# Patient Record
Sex: Male | Born: 1935
Health system: Southern US, Community
[De-identification: ages and names within clinical notes are randomized; demographics above are authoritative.]

## PROBLEM LIST (undated history)

## (undated) DIAGNOSIS — I499 Cardiac arrhythmia, unspecified: Secondary | ICD-10-CM

## (undated) DIAGNOSIS — F419 Anxiety disorder, unspecified: Secondary | ICD-10-CM

## (undated) DIAGNOSIS — E611 Iron deficiency: Secondary | ICD-10-CM

## (undated) DIAGNOSIS — M47816 Spondylosis without myelopathy or radiculopathy, lumbar region: Secondary | ICD-10-CM

## (undated) DIAGNOSIS — G8929 Other chronic pain: Secondary | ICD-10-CM

## (undated) DIAGNOSIS — R7303 Prediabetes: Secondary | ICD-10-CM

## (undated) DIAGNOSIS — C61 Malignant neoplasm of prostate: Secondary | ICD-10-CM

## (undated) DIAGNOSIS — M545 Low back pain, unspecified: Secondary | ICD-10-CM

## (undated) DIAGNOSIS — K219 Gastro-esophageal reflux disease without esophagitis: Secondary | ICD-10-CM

## (undated) DIAGNOSIS — L4050 Arthropathic psoriasis, unspecified: Secondary | ICD-10-CM

## (undated) DIAGNOSIS — I10 Essential (primary) hypertension: Secondary | ICD-10-CM

## (undated) DIAGNOSIS — I471 Supraventricular tachycardia, unspecified: Secondary | ICD-10-CM

## (undated) DIAGNOSIS — G2581 Restless legs syndrome: Secondary | ICD-10-CM

## (undated) DIAGNOSIS — M1990 Unspecified osteoarthritis, unspecified site: Secondary | ICD-10-CM

## (undated) DIAGNOSIS — F329 Major depressive disorder, single episode, unspecified: Secondary | ICD-10-CM

## (undated) DIAGNOSIS — Z950 Presence of cardiac pacemaker: Secondary | ICD-10-CM

## (undated) DIAGNOSIS — I6523 Occlusion and stenosis of bilateral carotid arteries: Secondary | ICD-10-CM

## (undated) DIAGNOSIS — G4733 Obstructive sleep apnea (adult) (pediatric): Secondary | ICD-10-CM

## (undated) DIAGNOSIS — F32A Depression, unspecified: Secondary | ICD-10-CM

## (undated) DIAGNOSIS — Z9989 Dependence on other enabling machines and devices: Secondary | ICD-10-CM

## (undated) DIAGNOSIS — E785 Hyperlipidemia, unspecified: Secondary | ICD-10-CM

## (undated) DIAGNOSIS — I480 Paroxysmal atrial fibrillation: Secondary | ICD-10-CM

## (undated) DIAGNOSIS — Z8739 Personal history of other diseases of the musculoskeletal system and connective tissue: Secondary | ICD-10-CM

## (undated) DIAGNOSIS — I639 Cerebral infarction, unspecified: Secondary | ICD-10-CM

## (undated) HISTORY — DX: Hyperlipidemia, unspecified: E78.5

## (undated) HISTORY — PX: BREAST SURGERY: SHX581

## (undated) HISTORY — PX: BACK SURGERY: SHX140

## (undated) HISTORY — DX: Cerebral infarction, unspecified: I63.9

## (undated) HISTORY — DX: Malignant neoplasm of prostate: C61

## (undated) HISTORY — PX: JOINT REPLACEMENT: SHX530

## (undated) HISTORY — PX: CATARACT EXTRACTION W/ INTRAOCULAR LENS  IMPLANT, BILATERAL: SHX1307

## (undated) HISTORY — PX: TRANSURETHRAL RESECTION OF BLADDER TUMOR WITH GYRUS (TURBT-GYRUS): SHX6458

## (undated) HISTORY — DX: Essential (primary) hypertension: I10

---

## 1960-06-21 HISTORY — PX: INGUINAL HERNIA REPAIR: SUR1180

## 1995-06-22 HISTORY — PX: TOTAL KNEE ARTHROPLASTY: SHX125

## 1995-06-22 HISTORY — PX: INCISION AND DRAINAGE: SHX5863

## 1995-06-22 HISTORY — PX: REVISION TOTAL KNEE ARTHROPLASTY: SHX767

## 1998-01-05 ENCOUNTER — Ambulatory Visit: Admission: RE | Admit: 1998-01-05 | Discharge: 1998-01-05 | Payer: Self-pay | Admitting: Pulmonary Disease

## 1998-01-07 ENCOUNTER — Ambulatory Visit (HOSPITAL_BASED_OUTPATIENT_CLINIC_OR_DEPARTMENT_OTHER): Admission: RE | Admit: 1998-01-07 | Discharge: 1998-01-07 | Payer: Self-pay | Admitting: Surgery

## 2000-02-08 ENCOUNTER — Inpatient Hospital Stay (HOSPITAL_COMMUNITY): Admission: EM | Admit: 2000-02-08 | Discharge: 2000-02-10 | Payer: Self-pay | Admitting: Emergency Medicine

## 2000-02-08 ENCOUNTER — Encounter: Payer: Self-pay | Admitting: Emergency Medicine

## 2000-02-23 ENCOUNTER — Encounter: Admission: RE | Admit: 2000-02-23 | Discharge: 2000-02-23 | Payer: Self-pay | Admitting: Internal Medicine

## 2000-08-12 ENCOUNTER — Encounter: Payer: Self-pay | Admitting: Neurosurgery

## 2000-08-12 ENCOUNTER — Encounter: Admission: RE | Admit: 2000-08-12 | Discharge: 2000-08-12 | Payer: Self-pay | Admitting: Neurosurgery

## 2001-08-08 ENCOUNTER — Encounter: Admission: RE | Admit: 2001-08-08 | Discharge: 2001-11-06 | Payer: Self-pay | Admitting: Internal Medicine

## 2001-09-28 ENCOUNTER — Encounter: Payer: Self-pay | Admitting: Internal Medicine

## 2001-09-28 ENCOUNTER — Encounter: Payer: Self-pay | Admitting: Gastroenterology

## 2001-09-28 ENCOUNTER — Ambulatory Visit (HOSPITAL_COMMUNITY): Admission: RE | Admit: 2001-09-28 | Discharge: 2001-09-28 | Payer: Self-pay | Admitting: Gastroenterology

## 2001-09-28 ENCOUNTER — Emergency Department (HOSPITAL_COMMUNITY): Admission: EM | Admit: 2001-09-28 | Discharge: 2001-09-28 | Payer: Self-pay | Admitting: Emergency Medicine

## 2001-09-28 ENCOUNTER — Encounter (INDEPENDENT_AMBULATORY_CARE_PROVIDER_SITE_OTHER): Payer: Self-pay | Admitting: Specialist

## 2003-06-22 HISTORY — PX: INGUINAL HERNIA REPAIR: SUR1180

## 2003-06-22 HISTORY — PX: PENILE PROSTHESIS IMPLANT: SHX240

## 2003-06-26 ENCOUNTER — Encounter: Admission: RE | Admit: 2003-06-26 | Discharge: 2003-06-26 | Payer: Self-pay | Admitting: Internal Medicine

## 2003-06-28 ENCOUNTER — Encounter: Admission: RE | Admit: 2003-06-28 | Discharge: 2003-06-28 | Payer: Self-pay | Admitting: Internal Medicine

## 2003-07-12 ENCOUNTER — Encounter (INDEPENDENT_AMBULATORY_CARE_PROVIDER_SITE_OTHER): Payer: Self-pay | Admitting: Specialist

## 2003-07-12 ENCOUNTER — Observation Stay (HOSPITAL_COMMUNITY): Admission: RE | Admit: 2003-07-12 | Discharge: 2003-07-13 | Payer: Self-pay | Admitting: Surgery

## 2004-04-21 ENCOUNTER — Ambulatory Visit: Payer: Self-pay | Admitting: Internal Medicine

## 2004-04-27 ENCOUNTER — Ambulatory Visit: Payer: Self-pay | Admitting: Internal Medicine

## 2004-06-21 HISTORY — PX: TRANSURETHRAL RESECTION OF PROSTATE: SHX73

## 2004-07-27 ENCOUNTER — Ambulatory Visit: Payer: Self-pay | Admitting: Internal Medicine

## 2004-10-19 ENCOUNTER — Ambulatory Visit: Payer: Self-pay | Admitting: Internal Medicine

## 2004-11-06 ENCOUNTER — Ambulatory Visit: Payer: Self-pay | Admitting: Internal Medicine

## 2005-01-05 ENCOUNTER — Inpatient Hospital Stay (HOSPITAL_COMMUNITY): Admission: RE | Admit: 2005-01-05 | Discharge: 2005-01-07 | Payer: Self-pay | Admitting: Urology

## 2005-01-05 ENCOUNTER — Encounter (INDEPENDENT_AMBULATORY_CARE_PROVIDER_SITE_OTHER): Payer: Self-pay | Admitting: *Deleted

## 2005-01-11 ENCOUNTER — Ambulatory Visit: Payer: Self-pay | Admitting: Internal Medicine

## 2005-01-21 ENCOUNTER — Ambulatory Visit: Payer: Self-pay | Admitting: Internal Medicine

## 2005-04-09 ENCOUNTER — Ambulatory Visit: Payer: Self-pay | Admitting: Internal Medicine

## 2005-04-10 ENCOUNTER — Emergency Department (HOSPITAL_COMMUNITY): Admission: EM | Admit: 2005-04-10 | Discharge: 2005-04-10 | Payer: Self-pay | Admitting: Emergency Medicine

## 2005-04-10 ENCOUNTER — Ambulatory Visit: Payer: Self-pay | Admitting: Internal Medicine

## 2005-04-13 ENCOUNTER — Ambulatory Visit: Payer: Self-pay | Admitting: Internal Medicine

## 2005-06-21 HISTORY — PX: PROSTATE BIOPSY: SHX241

## 2005-06-29 ENCOUNTER — Ambulatory Visit: Payer: Self-pay | Admitting: Internal Medicine

## 2005-08-03 ENCOUNTER — Encounter (INDEPENDENT_AMBULATORY_CARE_PROVIDER_SITE_OTHER): Payer: Self-pay | Admitting: *Deleted

## 2005-08-03 ENCOUNTER — Ambulatory Visit (HOSPITAL_BASED_OUTPATIENT_CLINIC_OR_DEPARTMENT_OTHER): Admission: RE | Admit: 2005-08-03 | Discharge: 2005-08-03 | Payer: Self-pay | Admitting: Urology

## 2005-08-10 ENCOUNTER — Ambulatory Visit (HOSPITAL_COMMUNITY): Admission: RE | Admit: 2005-08-10 | Discharge: 2005-08-10 | Payer: Self-pay | Admitting: Urology

## 2005-08-13 ENCOUNTER — Ambulatory Visit: Admission: RE | Admit: 2005-08-13 | Discharge: 2005-09-03 | Payer: Self-pay | Admitting: Radiation Oncology

## 2005-09-20 ENCOUNTER — Ambulatory Visit: Payer: Self-pay | Admitting: Internal Medicine

## 2005-09-28 ENCOUNTER — Ambulatory Visit: Payer: Self-pay | Admitting: Internal Medicine

## 2005-10-08 ENCOUNTER — Ambulatory Visit: Payer: Self-pay

## 2005-10-18 ENCOUNTER — Ambulatory Visit: Payer: Self-pay | Admitting: Internal Medicine

## 2005-10-29 ENCOUNTER — Ambulatory Visit: Admission: RE | Admit: 2005-10-29 | Discharge: 2006-01-27 | Payer: Self-pay | Admitting: Radiation Oncology

## 2006-01-19 LAB — URINALYSIS, MICROSCOPIC - CHCC
Glucose: NEGATIVE g/dL
Nitrite: NEGATIVE
Protein: NEGATIVE mg/dL

## 2006-01-20 LAB — URINE CULTURE

## 2006-01-26 ENCOUNTER — Ambulatory Visit: Payer: Self-pay | Admitting: Internal Medicine

## 2006-04-05 ENCOUNTER — Ambulatory Visit: Payer: Self-pay | Admitting: Internal Medicine

## 2006-05-26 ENCOUNTER — Ambulatory Visit: Payer: Self-pay | Admitting: Internal Medicine

## 2006-06-21 DIAGNOSIS — C61 Malignant neoplasm of prostate: Secondary | ICD-10-CM

## 2006-06-21 HISTORY — DX: Malignant neoplasm of prostate: C61

## 2006-06-21 HISTORY — PX: CARDIAC ELECTROPHYSIOLOGY STUDY AND ABLATION: SHX1294

## 2006-07-06 ENCOUNTER — Ambulatory Visit: Payer: Self-pay | Admitting: Internal Medicine

## 2006-07-06 LAB — CONVERTED CEMR LAB
Basophils Absolute: 0 10*3/uL (ref 0.0–0.1)
IgE (Immunoglobulin E), Serum: <1.5 intl units/mL (ref 0.0–180.0)
Lymphocytes Relative: 13.4 % (ref 12.0–46.0)
MCHC: 33.9 g/dL (ref 30.0–36.0)
MCV: 103.7 fL — ABNORMAL HIGH (ref 78.0–100.0)
Monocytes Relative: 12.1 % — ABNORMAL HIGH (ref 3.0–11.0)
Neutro Abs: 5.6 10*3/uL (ref 1.4–7.7)
Platelets: 326 10*3/uL (ref 150–400)

## 2006-08-18 ENCOUNTER — Ambulatory Visit: Payer: Self-pay | Admitting: Internal Medicine

## 2006-10-03 ENCOUNTER — Ambulatory Visit: Payer: Self-pay | Admitting: Internal Medicine

## 2006-10-03 LAB — CONVERTED CEMR LAB
Alkaline Phosphatase: 82 units/L (ref 39–117)
BUN: 18 mg/dL (ref 6–23)
Basophils Relative: 0.5 % (ref 0.0–1.0)
Bilirubin, Direct: 0.1 mg/dL (ref 0.0–0.3)
CO2: 32 meq/L (ref 19–32)
Cholesterol: 150 mg/dL (ref 0–200)
GFR calc Af Amer: 85 mL/min
Glucose, Bld: 108 mg/dL — ABNORMAL HIGH (ref 70–99)
HDL: 46.1 mg/dL (ref >39.0)
Hemoglobin: 13.2 g/dL (ref 13.0–17.0)
Lymphocytes Relative: 23.2 % (ref 12.0–46.0)
Monocytes Absolute: 0.5 10*3/uL (ref 0.2–0.7)
Monocytes Relative: 11.5 % — ABNORMAL HIGH (ref 3.0–11.0)
Neutro Abs: 2.7 10*3/uL (ref 1.4–7.7)
PSA: <0.03 ng/mL — ABNORMAL LOW (ref 0.10–4.00)
Potassium: 4.1 meq/L (ref 3.5–5.1)
TSH: 1.81 microintl units/mL (ref 0.35–5.50)
Total Protein: 6 g/dL (ref 6.0–8.3)
VLDL: 28 mg/dL (ref 0–40)

## 2006-10-10 ENCOUNTER — Ambulatory Visit: Payer: Self-pay | Admitting: Internal Medicine

## 2006-11-04 ENCOUNTER — Inpatient Hospital Stay (HOSPITAL_COMMUNITY): Admission: EM | Admit: 2006-11-04 | Discharge: 2006-11-05 | Payer: Self-pay | Admitting: Emergency Medicine

## 2006-11-08 ENCOUNTER — Ambulatory Visit: Payer: Self-pay | Admitting: Internal Medicine

## 2006-11-09 ENCOUNTER — Ambulatory Visit: Payer: Self-pay

## 2006-11-17 ENCOUNTER — Emergency Department (HOSPITAL_COMMUNITY): Admission: EM | Admit: 2006-11-17 | Discharge: 2006-11-17 | Payer: Self-pay | Admitting: Emergency Medicine

## 2006-11-18 ENCOUNTER — Ambulatory Visit: Payer: Self-pay | Admitting: Cardiology

## 2006-11-21 ENCOUNTER — Encounter: Payer: Self-pay | Admitting: Internal Medicine

## 2006-11-21 ENCOUNTER — Ambulatory Visit: Payer: Self-pay

## 2006-11-23 ENCOUNTER — Encounter: Payer: Self-pay | Admitting: Internal Medicine

## 2006-11-23 ENCOUNTER — Ambulatory Visit: Payer: Self-pay

## 2006-12-01 ENCOUNTER — Ambulatory Visit: Payer: Self-pay | Admitting: Internal Medicine

## 2006-12-01 ENCOUNTER — Encounter: Payer: Self-pay | Admitting: Internal Medicine

## 2006-12-02 ENCOUNTER — Ambulatory Visit (HOSPITAL_COMMUNITY): Admission: RE | Admit: 2006-12-02 | Discharge: 2006-12-03 | Payer: Self-pay | Admitting: Internal Medicine

## 2006-12-02 ENCOUNTER — Ambulatory Visit: Payer: Self-pay | Admitting: Internal Medicine

## 2006-12-07 ENCOUNTER — Ambulatory Visit: Payer: Self-pay | Admitting: Cardiology

## 2006-12-07 ENCOUNTER — Observation Stay (HOSPITAL_COMMUNITY): Admission: EM | Admit: 2006-12-07 | Discharge: 2006-12-08 | Payer: Self-pay | Admitting: Cardiology

## 2006-12-08 ENCOUNTER — Ambulatory Visit: Payer: Self-pay | Admitting: Internal Medicine

## 2006-12-26 ENCOUNTER — Ambulatory Visit: Payer: Self-pay | Admitting: Internal Medicine

## 2007-01-02 ENCOUNTER — Encounter: Payer: Self-pay | Admitting: Internal Medicine

## 2007-01-19 DIAGNOSIS — Z8546 Personal history of malignant neoplasm of prostate: Secondary | ICD-10-CM

## 2007-01-19 DIAGNOSIS — E785 Hyperlipidemia, unspecified: Secondary | ICD-10-CM

## 2007-01-19 DIAGNOSIS — I10 Essential (primary) hypertension: Secondary | ICD-10-CM | POA: Insufficient documentation

## 2007-01-24 ENCOUNTER — Ambulatory Visit: Payer: Self-pay | Admitting: Internal Medicine

## 2007-01-24 LAB — CONVERTED CEMR LAB
ALT: 22 units/L (ref 0–53)
AST: 23 units/L (ref 0–37)
Bilirubin, Direct: 0.1 mg/dL (ref 0.0–0.3)
Cholesterol: 181 mg/dL (ref 0–200)
Total Bilirubin: 0.9 mg/dL (ref 0.3–1.2)
Total Protein: 5.9 g/dL — ABNORMAL LOW (ref 6.0–8.3)

## 2007-01-31 ENCOUNTER — Ambulatory Visit: Payer: Self-pay | Admitting: Internal Medicine

## 2007-01-31 DIAGNOSIS — L4050 Arthropathic psoriasis, unspecified: Secondary | ICD-10-CM

## 2007-01-31 DIAGNOSIS — R142 Eructation: Secondary | ICD-10-CM

## 2007-01-31 DIAGNOSIS — R141 Gas pain: Secondary | ICD-10-CM | POA: Insufficient documentation

## 2007-01-31 DIAGNOSIS — I482 Chronic atrial fibrillation, unspecified: Secondary | ICD-10-CM | POA: Insufficient documentation

## 2007-01-31 DIAGNOSIS — R143 Flatulence: Secondary | ICD-10-CM

## 2007-03-09 ENCOUNTER — Ambulatory Visit: Payer: Self-pay | Admitting: Cardiology

## 2007-03-23 ENCOUNTER — Ambulatory Visit: Payer: Self-pay | Admitting: Internal Medicine

## 2007-03-23 DIAGNOSIS — K219 Gastro-esophageal reflux disease without esophagitis: Secondary | ICD-10-CM | POA: Insufficient documentation

## 2007-03-23 LAB — CONVERTED CEMR LAB
Basophils Absolute: 0 10*3/uL (ref 0.0–0.1)
H Pylori IgG: NEGATIVE
MCHC: 34.2 g/dL (ref 30.0–36.0)
Monocytes Relative: 12.6 % — ABNORMAL HIGH (ref 3.0–11.0)
RBC: 4.3 M/uL (ref 4.22–5.81)
RDW: 12.7 % (ref 11.5–14.6)

## 2007-04-19 ENCOUNTER — Ambulatory Visit: Payer: Self-pay | Admitting: Vascular Surgery

## 2007-05-05 ENCOUNTER — Ambulatory Visit: Payer: Self-pay | Admitting: Internal Medicine

## 2007-06-22 HISTORY — PX: CHOLECYSTECTOMY: SHX55

## 2007-07-13 ENCOUNTER — Encounter: Payer: Self-pay | Admitting: Internal Medicine

## 2007-08-14 ENCOUNTER — Encounter: Payer: Self-pay | Admitting: Internal Medicine

## 2007-08-31 ENCOUNTER — Ambulatory Visit: Payer: Self-pay | Admitting: Internal Medicine

## 2007-08-31 DIAGNOSIS — G4700 Insomnia, unspecified: Secondary | ICD-10-CM | POA: Insufficient documentation

## 2007-08-31 DIAGNOSIS — G4730 Sleep apnea, unspecified: Secondary | ICD-10-CM

## 2007-08-31 LAB — CONVERTED CEMR LAB
HDL goal, serum: 40 mg/dL
LDL Goal: 100 mg/dL

## 2007-10-09 ENCOUNTER — Ambulatory Visit: Payer: Self-pay | Admitting: Internal Medicine

## 2007-10-09 LAB — CONVERTED CEMR LAB
ALT: 22 units/L (ref 0–53)
AST: 24 units/L (ref 0–37)
Albumin: 3.3 g/dL — ABNORMAL LOW (ref 3.5–5.2)
Alkaline Phosphatase: 76 units/L (ref 39–117)
BUN: 23 mg/dL (ref 6–23)
Bilirubin, Direct: 0.1 mg/dL (ref 0.0–0.3)
CO2: 30 meq/L (ref 19–32)
Chloride: 111 meq/L (ref 96–112)
Eosinophils Relative: 8.7 % — ABNORMAL HIGH (ref 0.0–5.0)
Glucose, Bld: 114 mg/dL — ABNORMAL HIGH (ref 70–99)
HCT: 38.4 % — ABNORMAL LOW (ref 39.0–52.0)
HDL: 46.1 mg/dL (ref >39.0)
LDL Cholesterol: 100 mg/dL — ABNORMAL HIGH (ref 0–99)
Lymphocytes Relative: 20.9 % (ref 12.0–46.0)
Monocytes Absolute: 0.7 10*3/uL (ref 0.1–1.0)
Monocytes Relative: 11.8 % (ref 3.0–12.0)
Neutrophils Relative %: 58.5 % (ref 43.0–77.0)
Nitrite: NEGATIVE
Platelets: 216 10*3/uL (ref 150–400)
Potassium: 4.4 meq/L (ref 3.5–5.1)
Sodium: 144 meq/L (ref 135–145)
Specific Gravity, Urine: 1.025
Total CHOL/HDL Ratio: 3.8
Total Protein: 5.8 g/dL — ABNORMAL LOW (ref 6.0–8.3)
Urobilinogen, UA: 0.2
WBC Urine, dipstick: NEGATIVE
WBC: 6.3 10*3/uL (ref 4.5–10.5)

## 2007-10-16 ENCOUNTER — Ambulatory Visit: Payer: Self-pay | Admitting: Internal Medicine

## 2007-12-25 ENCOUNTER — Ambulatory Visit: Payer: Self-pay | Admitting: Internal Medicine

## 2007-12-25 DIAGNOSIS — R609 Edema, unspecified: Secondary | ICD-10-CM

## 2007-12-25 DIAGNOSIS — I8310 Varicose veins of unspecified lower extremity with inflammation: Secondary | ICD-10-CM

## 2008-01-03 ENCOUNTER — Ambulatory Visit: Payer: Self-pay | Admitting: Vascular Surgery

## 2008-01-31 ENCOUNTER — Telehealth: Payer: Self-pay | Admitting: Family Medicine

## 2008-01-31 ENCOUNTER — Inpatient Hospital Stay (HOSPITAL_COMMUNITY): Admission: EM | Admit: 2008-01-31 | Discharge: 2008-02-02 | Payer: Self-pay | Admitting: Emergency Medicine

## 2008-02-01 ENCOUNTER — Encounter (INDEPENDENT_AMBULATORY_CARE_PROVIDER_SITE_OTHER): Payer: Self-pay | Admitting: General Surgery

## 2008-02-20 ENCOUNTER — Ambulatory Visit: Payer: Self-pay | Admitting: Internal Medicine

## 2008-02-20 DIAGNOSIS — K8019 Calculus of gallbladder with other cholecystitis with obstruction: Secondary | ICD-10-CM

## 2008-03-21 ENCOUNTER — Ambulatory Visit: Payer: Self-pay | Admitting: Internal Medicine

## 2008-03-27 ENCOUNTER — Telehealth: Payer: Self-pay | Admitting: Internal Medicine

## 2008-04-22 ENCOUNTER — Telehealth: Payer: Self-pay | Admitting: Internal Medicine

## 2008-04-24 ENCOUNTER — Ambulatory Visit: Payer: Self-pay | Admitting: *Deleted

## 2008-04-24 ENCOUNTER — Ambulatory Visit: Payer: Self-pay | Admitting: Cardiovascular Disease

## 2008-04-24 LAB — CONVERTED CEMR LAB
BUN: 24 mg/dL — ABNORMAL HIGH (ref 6–23)
CO2: 27 meq/L (ref 19–32)
Calcium: 9 mg/dL (ref 8.4–10.5)
GFR calc Af Amer: 70 mL/min
Glucose, Bld: 101 mg/dL — ABNORMAL HIGH (ref 70–99)

## 2008-04-29 ENCOUNTER — Telehealth: Payer: Self-pay | Admitting: Internal Medicine

## 2008-05-01 ENCOUNTER — Ambulatory Visit: Payer: Self-pay | Admitting: Internal Medicine

## 2008-05-01 DIAGNOSIS — J45901 Unspecified asthma with (acute) exacerbation: Secondary | ICD-10-CM

## 2008-05-02 ENCOUNTER — Ambulatory Visit: Payer: Self-pay | Admitting: Internal Medicine

## 2008-06-24 ENCOUNTER — Ambulatory Visit: Payer: Self-pay | Admitting: Internal Medicine

## 2008-06-24 DIAGNOSIS — B368 Other specified superficial mycoses: Secondary | ICD-10-CM

## 2008-10-14 ENCOUNTER — Ambulatory Visit: Payer: Self-pay | Admitting: Internal Medicine

## 2008-10-14 ENCOUNTER — Telehealth: Payer: Self-pay | Admitting: Internal Medicine

## 2008-10-15 ENCOUNTER — Ambulatory Visit: Payer: Self-pay | Admitting: Internal Medicine

## 2008-10-15 DIAGNOSIS — Q762 Congenital spondylolisthesis: Secondary | ICD-10-CM

## 2008-10-17 ENCOUNTER — Ambulatory Visit: Payer: Self-pay | Admitting: Internal Medicine

## 2008-10-18 ENCOUNTER — Encounter: Payer: Self-pay | Admitting: Internal Medicine

## 2008-10-28 ENCOUNTER — Telehealth: Payer: Self-pay | Admitting: Internal Medicine

## 2009-01-03 ENCOUNTER — Ambulatory Visit: Payer: Self-pay | Admitting: Vascular Surgery

## 2009-01-07 ENCOUNTER — Telehealth (INDEPENDENT_AMBULATORY_CARE_PROVIDER_SITE_OTHER): Payer: Self-pay | Admitting: *Deleted

## 2009-01-10 ENCOUNTER — Ambulatory Visit: Payer: Self-pay | Admitting: Internal Medicine

## 2009-01-10 DIAGNOSIS — R7309 Other abnormal glucose: Secondary | ICD-10-CM

## 2009-01-10 LAB — CONVERTED CEMR LAB
BUN: 21 mg/dL (ref 6–23)
Basophils Relative: 0.1 % (ref 0.0–3.0)
CO2: 31 meq/L (ref 19–32)
Calcium: 8.9 mg/dL (ref 8.4–10.5)
Creatinine, Ser: 1.1 mg/dL (ref 0.4–1.5)
Eosinophils Absolute: 0.1 10*3/uL (ref 0.0–0.7)
GFR calc non Af Amer: 69.65 mL/min (ref >60)
Glucose, Bld: 101 mg/dL — ABNORMAL HIGH (ref 70–99)
MCHC: 33.6 g/dL (ref 30.0–36.0)
MCV: 92.4 fL (ref 78.0–100.0)
Monocytes Absolute: 0.9 10*3/uL (ref 0.1–1.0)
Neutrophils Relative %: 74.7 % (ref 43.0–77.0)
Platelets: 213 10*3/uL (ref 150.0–400.0)
RBC: 4.35 M/uL (ref 4.22–5.81)
RDW: 13.2 % (ref 11.5–14.6)

## 2009-03-28 ENCOUNTER — Encounter (INDEPENDENT_AMBULATORY_CARE_PROVIDER_SITE_OTHER): Payer: Self-pay | Admitting: *Deleted

## 2009-04-12 ENCOUNTER — Encounter: Admission: RE | Admit: 2009-04-12 | Discharge: 2009-04-12 | Payer: Self-pay | Admitting: Neurosurgery

## 2009-04-16 ENCOUNTER — Encounter (INDEPENDENT_AMBULATORY_CARE_PROVIDER_SITE_OTHER): Payer: Self-pay | Admitting: *Deleted

## 2009-04-21 ENCOUNTER — Encounter (INDEPENDENT_AMBULATORY_CARE_PROVIDER_SITE_OTHER): Payer: Self-pay | Admitting: *Deleted

## 2009-05-07 ENCOUNTER — Ambulatory Visit: Payer: Self-pay | Admitting: Internal Medicine

## 2009-05-07 LAB — CONVERTED CEMR LAB
Bilirubin, Direct: 0 mg/dL (ref 0.0–0.3)
Cholesterol: 198 mg/dL (ref 0–200)
HDL: 47.9 mg/dL (ref >39.00)
LDL Cholesterol: 123 mg/dL — ABNORMAL HIGH (ref 0–99)
Total Bilirubin: 1 mg/dL (ref 0.3–1.2)
Total Protein: 6.2 g/dL (ref 6.0–8.3)
VLDL: 26.8 mg/dL (ref 0.0–40.0)
Vit D, 25-Hydroxy: 25 ng/mL — ABNORMAL LOW (ref 30–89)

## 2009-05-12 ENCOUNTER — Ambulatory Visit: Payer: Self-pay | Admitting: Internal Medicine

## 2009-05-12 DIAGNOSIS — M818 Other osteoporosis without current pathological fracture: Secondary | ICD-10-CM

## 2009-05-19 ENCOUNTER — Inpatient Hospital Stay (HOSPITAL_COMMUNITY): Admission: RE | Admit: 2009-05-19 | Discharge: 2009-05-21 | Payer: Self-pay | Admitting: Neurosurgery

## 2009-05-26 ENCOUNTER — Telehealth: Payer: Self-pay | Admitting: Internal Medicine

## 2009-06-02 ENCOUNTER — Encounter: Payer: Self-pay | Admitting: Internal Medicine

## 2009-07-02 ENCOUNTER — Ambulatory Visit: Payer: Self-pay | Admitting: Internal Medicine

## 2009-07-02 ENCOUNTER — Ambulatory Visit: Payer: Self-pay

## 2009-07-02 DIAGNOSIS — L03119 Cellulitis of unspecified part of limb: Secondary | ICD-10-CM

## 2009-07-02 DIAGNOSIS — I808 Phlebitis and thrombophlebitis of other sites: Secondary | ICD-10-CM | POA: Insufficient documentation

## 2009-07-02 DIAGNOSIS — L02419 Cutaneous abscess of limb, unspecified: Secondary | ICD-10-CM | POA: Insufficient documentation

## 2009-07-04 ENCOUNTER — Telehealth: Payer: Self-pay | Admitting: Internal Medicine

## 2009-07-18 ENCOUNTER — Ambulatory Visit: Payer: Self-pay | Admitting: Vascular Surgery

## 2009-08-07 ENCOUNTER — Ambulatory Visit: Payer: Self-pay | Admitting: Internal Medicine

## 2009-08-07 LAB — CONVERTED CEMR LAB: Vit D, 25-Hydroxy: 48 ng/mL (ref 30–89)

## 2009-08-20 ENCOUNTER — Ambulatory Visit: Payer: Self-pay | Admitting: Internal Medicine

## 2009-10-03 ENCOUNTER — Encounter: Payer: Self-pay | Admitting: Internal Medicine

## 2009-11-19 ENCOUNTER — Ambulatory Visit: Payer: Self-pay | Admitting: Family Medicine

## 2009-11-19 DIAGNOSIS — R319 Hematuria, unspecified: Secondary | ICD-10-CM

## 2009-11-20 LAB — CONVERTED CEMR LAB
ALT: 36 units/L (ref 0–53)
Albumin: 4.1 g/dL (ref 3.5–5.2)
Alkaline Phosphatase: 82 units/L (ref 39–117)
BUN: 29 mg/dL — ABNORMAL HIGH (ref 6–23)
Basophils Relative: 0.4 % (ref 0.0–3.0)
Bilirubin, Direct: 0.1 mg/dL (ref 0.0–0.3)
CO2: 30 meq/L (ref 19–32)
Calcium: 9.8 mg/dL (ref 8.4–10.5)
Eosinophils Relative: 0.1 % (ref 0.0–5.0)
GFR calc non Af Amer: 48.73 mL/min (ref >60)
Glucose, Bld: 132 mg/dL — ABNORMAL HIGH (ref 70–99)
HCT: 42.6 % (ref 39.0–52.0)
HDL: 66.5 mg/dL (ref >39.00)
Hemoglobin: 14.7 g/dL (ref 13.0–17.0)
Lymphs Abs: 0.7 10*3/uL (ref 0.7–4.0)
MCV: 89.2 fL (ref 78.0–100.0)
Monocytes Absolute: 0.5 10*3/uL (ref 0.1–1.0)
Monocytes Relative: 4.4 % (ref 3.0–12.0)
Neutro Abs: 11 10*3/uL — ABNORMAL HIGH (ref 1.4–7.7)
Sodium: 145 meq/L (ref 135–145)
Total CHOL/HDL Ratio: 3
Total Protein: 7.3 g/dL (ref 6.0–8.3)
Triglycerides: 78 mg/dL (ref 0.0–149.0)
WBC: 12.2 10*3/uL — ABNORMAL HIGH (ref 4.5–10.5)

## 2009-11-21 LAB — CONVERTED CEMR LAB: PSA: 0.44 ng/mL (ref 0.10–4.00)

## 2009-11-27 ENCOUNTER — Encounter: Payer: Self-pay | Admitting: Internal Medicine

## 2009-12-04 ENCOUNTER — Encounter: Payer: Self-pay | Admitting: Internal Medicine

## 2010-01-02 ENCOUNTER — Telehealth: Payer: Self-pay | Admitting: Adult Health

## 2010-01-02 ENCOUNTER — Telehealth: Payer: Self-pay | Admitting: Internal Medicine

## 2010-01-05 ENCOUNTER — Ambulatory Visit: Payer: Self-pay | Admitting: Internal Medicine

## 2010-01-06 ENCOUNTER — Telehealth: Payer: Self-pay | Admitting: Internal Medicine

## 2010-01-07 ENCOUNTER — Ambulatory Visit: Payer: Self-pay | Admitting: Internal Medicine

## 2010-01-07 LAB — CONVERTED CEMR LAB: Glucose, 2 hour: 77 mg/dL

## 2010-01-08 ENCOUNTER — Telehealth: Payer: Self-pay | Admitting: Internal Medicine

## 2010-01-09 ENCOUNTER — Telehealth: Payer: Self-pay | Admitting: Internal Medicine

## 2010-01-13 ENCOUNTER — Ambulatory Visit: Payer: Self-pay | Admitting: Family Medicine

## 2010-01-13 DIAGNOSIS — R51 Headache: Secondary | ICD-10-CM

## 2010-01-13 DIAGNOSIS — R519 Headache, unspecified: Secondary | ICD-10-CM | POA: Insufficient documentation

## 2010-01-15 ENCOUNTER — Encounter: Admission: RE | Admit: 2010-01-15 | Discharge: 2010-01-15 | Payer: Self-pay | Admitting: Family Medicine

## 2010-01-16 ENCOUNTER — Encounter: Payer: Self-pay | Admitting: Internal Medicine

## 2010-01-26 ENCOUNTER — Ambulatory Visit: Payer: Self-pay | Admitting: Internal Medicine

## 2010-01-30 ENCOUNTER — Ambulatory Visit (HOSPITAL_COMMUNITY): Admission: RE | Admit: 2010-01-30 | Discharge: 2010-01-30 | Payer: Self-pay | Admitting: Internal Medicine

## 2010-01-30 ENCOUNTER — Ambulatory Visit: Payer: Self-pay | Admitting: Internal Medicine

## 2010-01-30 HISTORY — PX: OTHER SURGICAL HISTORY: SHX169

## 2010-02-05 LAB — CONVERTED CEMR LAB
BUN: 22 mg/dL (ref 6–23)
Basophils Absolute: 0 10*3/uL (ref 0.0–0.1)
Basophils Relative: 0.5 % (ref 0.0–3.0)
Calcium: 9 mg/dL (ref 8.4–10.5)
Chloride: 105 meq/L (ref 96–112)
Creatinine, Ser: 1.2 mg/dL (ref 0.4–1.5)
Eosinophils Absolute: 0.2 10*3/uL (ref 0.0–0.7)
GFR calc non Af Amer: 65.98 mL/min (ref >60)
Hemoglobin: 12.4 g/dL — ABNORMAL LOW (ref 13.0–17.0)
INR: 0.9 (ref 0.8–1.0)
Lymphocytes Relative: 12.5 % (ref 12.0–46.0)
Lymphs Abs: 0.9 10*3/uL (ref 0.7–4.0)
MCHC: 34.4 g/dL (ref 30.0–36.0)
MCV: 87.3 fL (ref 78.0–100.0)
Monocytes Absolute: 0.9 10*3/uL (ref 0.1–1.0)
Neutro Abs: 5.3 10*3/uL (ref 1.4–7.7)
Prothrombin Time: 9.8 s (ref 9.7–11.8)
RBC: 4.12 M/uL — ABNORMAL LOW (ref 4.22–5.81)
RDW: 14.1 % (ref 11.5–14.6)

## 2010-02-06 ENCOUNTER — Telehealth: Payer: Self-pay | Admitting: Internal Medicine

## 2010-02-06 ENCOUNTER — Encounter: Payer: Self-pay | Admitting: Internal Medicine

## 2010-02-16 ENCOUNTER — Ambulatory Visit: Payer: Self-pay | Admitting: Internal Medicine

## 2010-03-02 ENCOUNTER — Encounter: Payer: Self-pay | Admitting: Internal Medicine

## 2010-03-03 ENCOUNTER — Encounter: Payer: Self-pay | Admitting: Internal Medicine

## 2010-03-03 ENCOUNTER — Ambulatory Visit: Payer: Self-pay | Admitting: Vascular Surgery

## 2010-03-10 ENCOUNTER — Encounter: Payer: Self-pay | Admitting: Internal Medicine

## 2010-03-17 ENCOUNTER — Telehealth: Payer: Self-pay | Admitting: Internal Medicine

## 2010-04-13 ENCOUNTER — Encounter: Payer: Self-pay | Admitting: Internal Medicine

## 2010-04-13 ENCOUNTER — Ambulatory Visit: Payer: Self-pay | Admitting: Internal Medicine

## 2010-04-13 LAB — CONVERTED CEMR LAB
ALT: 24 units/L (ref 0–53)
AST: 26 units/L (ref 0–37)
BUN: 25 mg/dL — ABNORMAL HIGH (ref 6–23)
Basophils Relative: 0.5 % (ref 0.0–3.0)
Bilirubin, Direct: 0.1 mg/dL (ref 0.0–0.3)
Chloride: 104 meq/L (ref 96–112)
Cholesterol: 139 mg/dL (ref 0–200)
Creatinine, Ser: 1.2 mg/dL (ref 0.4–1.5)
Eosinophils Relative: 2.7 % (ref 0.0–5.0)
GFR calc non Af Amer: 61.02 mL/min (ref >60)
HCT: 35.3 % — ABNORMAL LOW (ref 39.0–52.0)
LDL Cholesterol: 71 mg/dL (ref 0–99)
MCV: 88.3 fL (ref 78.0–100.0)
Monocytes Relative: 12.8 % — ABNORMAL HIGH (ref 3.0–12.0)
Neutrophils Relative %: 67 % (ref 43.0–77.0)
Nitrite: NEGATIVE
Platelets: 204 10*3/uL (ref 150.0–400.0)
Potassium: 4.1 meq/L (ref 3.5–5.1)
Protein, U semiquant: NEGATIVE
RBC: 4 M/uL — ABNORMAL LOW (ref 4.22–5.81)
Total Bilirubin: 0.6 mg/dL (ref 0.3–1.2)
Total CHOL/HDL Ratio: 3
Total Protein: 5.7 g/dL — ABNORMAL LOW (ref 6.0–8.3)
Urobilinogen, UA: 0.2
VLDL: 24 mg/dL (ref 0.0–40.0)
WBC: 6 10*3/uL (ref 4.5–10.5)

## 2010-04-15 ENCOUNTER — Ambulatory Visit: Payer: Self-pay | Admitting: Internal Medicine

## 2010-04-16 ENCOUNTER — Encounter: Payer: Self-pay | Admitting: Internal Medicine

## 2010-04-17 ENCOUNTER — Ambulatory Visit: Payer: Self-pay

## 2010-04-20 ENCOUNTER — Ambulatory Visit: Payer: Self-pay | Admitting: Internal Medicine

## 2010-04-20 ENCOUNTER — Telehealth: Payer: Self-pay | Admitting: Internal Medicine

## 2010-04-20 DIAGNOSIS — D508 Other iron deficiency anemias: Secondary | ICD-10-CM

## 2010-04-20 DIAGNOSIS — L408 Other psoriasis: Secondary | ICD-10-CM

## 2010-04-21 LAB — CONVERTED CEMR LAB: Saturation Ratios: 11.1 % — ABNORMAL LOW (ref 20.0–50.0)

## 2010-05-19 HISTORY — PX: OTHER SURGICAL HISTORY: SHX169

## 2010-06-01 ENCOUNTER — Encounter (INDEPENDENT_AMBULATORY_CARE_PROVIDER_SITE_OTHER): Payer: Self-pay | Admitting: *Deleted

## 2010-06-01 ENCOUNTER — Ambulatory Visit: Payer: Self-pay | Admitting: Internal Medicine

## 2010-06-01 DIAGNOSIS — R55 Syncope and collapse: Secondary | ICD-10-CM

## 2010-06-03 ENCOUNTER — Telehealth: Payer: Self-pay | Admitting: Internal Medicine

## 2010-06-04 ENCOUNTER — Ambulatory Visit (HOSPITAL_COMMUNITY)
Admission: RE | Admit: 2010-06-04 | Discharge: 2010-06-05 | Payer: Self-pay | Source: Home / Self Care | Attending: Internal Medicine | Admitting: Internal Medicine

## 2010-06-04 HISTORY — PX: CARDIAC PACEMAKER PLACEMENT: SHX583

## 2010-06-04 HISTORY — PX: LOOP RECORDER REMOVAL: EP1215

## 2010-06-04 LAB — CONVERTED CEMR LAB
Basophils Relative: 0.4 % (ref 0.0–3.0)
CO2: 28 meq/L (ref 19–32)
Calcium: 9.4 mg/dL (ref 8.4–10.5)
GFR calc non Af Amer: 61.57 mL/min (ref >60.00)
Hemoglobin: 13.8 g/dL (ref 13.0–17.0)
Lymphocytes Relative: 7.2 % — ABNORMAL LOW (ref 12.0–46.0)
MCHC: 34.1 g/dL (ref 30.0–36.0)
Monocytes Relative: 5.8 % (ref 3.0–12.0)
Neutro Abs: 7.7 10*3/uL (ref 1.4–7.7)
Potassium: 5 meq/L (ref 3.5–5.1)
RBC: 4.55 M/uL (ref 4.22–5.81)
Sodium: 141 meq/L (ref 135–145)
aPTT: 23.5 s (ref 21.7–28.8)

## 2010-06-05 ENCOUNTER — Encounter: Payer: Self-pay | Admitting: Internal Medicine

## 2010-06-10 ENCOUNTER — Telehealth (INDEPENDENT_AMBULATORY_CARE_PROVIDER_SITE_OTHER): Payer: Self-pay | Admitting: *Deleted

## 2010-06-12 ENCOUNTER — Ambulatory Visit: Payer: Self-pay | Admitting: Internal Medicine

## 2010-06-17 ENCOUNTER — Ambulatory Visit: Payer: Self-pay

## 2010-07-20 ENCOUNTER — Ambulatory Visit
Admission: RE | Admit: 2010-07-20 | Discharge: 2010-07-20 | Payer: Self-pay | Source: Home / Self Care | Attending: Internal Medicine | Admitting: Internal Medicine

## 2010-07-20 ENCOUNTER — Other Ambulatory Visit: Payer: Self-pay | Admitting: Internal Medicine

## 2010-07-20 DIAGNOSIS — H539 Unspecified visual disturbance: Secondary | ICD-10-CM | POA: Insufficient documentation

## 2010-07-20 LAB — BASIC METABOLIC PANEL
BUN: 16 mg/dL (ref 6–23)
GFR: 69.36 mL/min (ref >60.00)
Potassium: 5.1 mEq/L (ref 3.5–5.1)
Sodium: 141 mEq/L (ref 135–145)

## 2010-07-20 LAB — CBC WITH DIFFERENTIAL/PLATELET
Eosinophils Relative: 0.3 % (ref 0.0–5.0)
Lymphocytes Relative: 7.6 % — ABNORMAL LOW (ref 12.0–46.0)
Monocytes Absolute: 0.7 10*3/uL (ref 0.1–1.0)
Monocytes Relative: 7.5 % (ref 3.0–12.0)
Neutrophils Relative %: 84.3 % — ABNORMAL HIGH (ref 43.0–77.0)
Platelets: 233 10*3/uL (ref 150.0–400.0)
RBC: 4.55 Mil/uL (ref 4.22–5.81)
WBC: 9 10*3/uL (ref 4.5–10.5)

## 2010-07-20 LAB — CHOLESTEROL, TOTAL: Cholesterol: 141 mg/dL (ref 0–200)

## 2010-07-20 LAB — IBC PANEL
Iron: 37 ug/dL — ABNORMAL LOW (ref 42–165)
Saturation Ratios: 8 % — ABNORMAL LOW (ref 20.0–50.0)
Transferrin: 330 mg/dL (ref 212.0–360.0)

## 2010-07-20 LAB — LDL CHOLESTEROL, DIRECT: Direct LDL: 75.1 mg/dL

## 2010-07-20 LAB — HDL CHOLESTEROL: HDL: 52.3 mg/dL (ref >39.00)

## 2010-07-23 NOTE — Assessment & Plan Note (Signed)
Summary: cellulitis/per Christopher Baker/pt coming in at 12:00pm/cjr   Vital Signs:  Patient profile:   77 year old male Height:      62 inches Weight:      226 pounds BMI:     41.49 Temp:     98.2 degrees F oral Pulse rate:   80 / minute Resp:     14 per minute BP sitting:   150 / 70  (left arm)  Vitals Entered By: Willy Eddy, LPN (July 02, 2009 12:10 PM) CC: c/o of left foot red and swollen and painful and hot to touch   CC:  c/o of left foot red and swollen and painful and hot to touch.  History of Present Illness: THE PT HAD SWEELING FOR 24 HJOURS IN THE LEFT LEG WITH PAIN AND SWELLING the swelling did not resolve overnight and the redness persisted therfore he came to the office for evaluaton. He has had recent surgery with emobilization as a risk He wears comptression stocking for venous dz and edema he has no hx of blood clots or clotting disorder  Preventive Screening-Counseling & Management  Alcohol-Tobacco     Smoking Status: quit > 6 months  Problems Prior to Update: 1)  Cellulitis, Leg, Left  (ICD-682.6) 2)  Phlebitis and Thrombophlebitis of Other Site  (ICD-451.89) 3)  Disuse Osteoporosis  (ICD-733.03) 4)  Hyperglycemia, Borderline  (ICD-790.29) 5)  Leukocytosis Unspecified  (ICD-288.60) 6)  Spondylosis, Lumbar, With Radiculopathy  (ICD-756.11) 7)  Hip Pain, Left  (ICD-719.45) 8)  Other Specified Dermatomycoses  (ICD-111.8) 9)  Extrinsic Asthma, With Exacerbation  (ICD-493.02) 10)  Calcu Gallbladd W/oth Cholecystitis&obstruction  (ICD-574.11) 11)  Varicose Veins Lower Extremities W/inflammation  (ICD-454.1) 12)  Edema  (ICD-782.3) 13)  Preventive Health Care  (ICD-V70.0) 14)  Arthritis, Hand  (ICD-716.94) 15)  Insomnia With Sleep Apnea Unspecified  (ICD-780.51) 16)  Gerd  (ICD-530.81) 17)  Psoriatic Arthritis  (ICD-696.0) 18)  Fibrillation, Atrial  (ICD-427.31) 19)  Abdominal Bloating  (ICD-787.3) 20)  Advef, Drug/medicinal/biological Subst Nos   (ICD-995.20) 21)  Hyperlipidemia  (ICD-272.4) 22)  Prostate Cancer, Hx of  (ICD-V10.46) 23)  Hypertension  (ICD-401.9) 24)  Hyperlipidemia  (ICD-272.4)  Medications Prior to Update: 1)  Benicar Hct 40-12.5 Mg  Tabs (Olmesartan Medoxomil-Hctz) .... Take 1 Tablet By Mouth Once A Day 2)  Vytorin 10-40 Mg  Tabs (Ezetimibe-Simvastatin) .... Once Daily 3)  Prednisone 5 Mg  Tabs (Prednisone) .... Take 1 Tablet By Mouth Once A Day 4)  Requip 2 Mg Tabs (Ropinirole Hcl) .Marland Kitchen.. 1 Once Daily 5)  Zyrtec Allergy 10 Mg  Tabs (Cetirizine Hcl) .... As Needed 6)  Nabumetone 500 Mg  Tabs (Nabumetone) .... Once Daily 7)  Zegerid 40-1100 Mg  Caps (Omeprazole-Sodium Bicarbonate) .... One By Mouth Q Hs 8)  Amlodipine Besylate 5 Mg  Tabs (Amlodipine Besylate) .... One By Mouth Daqily 9)  Hydrocodone-Acetaminophen 5-325 Mg  Tabs (Hydrocodone-Acetaminophen) .... One By Mouth Q Hs 10)  Voltaren 1 %  Gel (Diclofenac Sodium) .... To Knees Prn 11)  Lotrisone 1-0.05 % Crea (Clotrimazole-Betamethasone) .... Apply To Affected Area Twice A Day For 7 To 14 Days 12)  Prednisone 10 Mg Tabs (Prednisone) .... 4 By Mouth For 4 Days The 3 By Mouth For 4 Days The 2 By Mouth For 4 Days The 1 By Mouth For 4 Days Then Resume 5 Mg A Day 13)  Skelaxin 800 Mg Tabs (Metaxalone) .... One Half To One By Mouth Every 6 Hours ( One Whole At  Night) 14)  Percocet 10-650 Mg Tabs (Oxycodone-Acetaminophen) .... One By Mouth Q 6 15)  Temazepam 30 Mg Caps (Temazepam) .Marland Kitchen.. 1 At Bedtime As Needed Sleep 16)  Vitamin D (Ergocalciferol) 50000 Unit Caps (Ergocalciferol) .... One By Mouth Weekly 17)  Cipro 500 Mg Tabs (Ciprofloxacin Hcl) .... One Two Times A Day For 10 Days.  Current Medications (verified): 1)  Benicar Hct 40-12.5 Mg  Tabs (Olmesartan Medoxomil-Hctz) .... Take 1 Tablet By Mouth Once A Day 2)  Vytorin 10-40 Mg  Tabs (Ezetimibe-Simvastatin) .... Once Daily 3)  Prednisone 5 Mg  Tabs (Prednisone) .... Take 1 Tablet By Mouth Once A Day 4)   Requip 2 Mg Tabs (Ropinirole Hcl) .Marland Kitchen.. 1 Once Daily 5)  Zyrtec Allergy 10 Mg  Tabs (Cetirizine Hcl) .... As Needed 6)  Nabumetone 500 Mg  Tabs (Nabumetone) .... Once Daily 7)  Zegerid 40-1100 Mg  Caps (Omeprazole-Sodium Bicarbonate) .... One By Mouth Q Hs 8)  Amlodipine Besylate 5 Mg  Tabs (Amlodipine Besylate) .... One By Mouth Daqily 9)  Hydrocodone-Acetaminophen 5-325 Mg  Tabs (Hydrocodone-Acetaminophen) .... One By Mouth Q Hs 10)  Voltaren 1 %  Gel (Diclofenac Sodium) .... To Knees Prn 11)  Lotrisone 1-0.05 % Crea (Clotrimazole-Betamethasone) .... Apply To Affected Area Twice A Day For 7 To 14 Days 12)  Prednisone 5 Mg Tabs (Prednisone) .Marland Kitchen.. 1 Once Daily 13)  Skelaxin 800 Mg Tabs (Metaxalone) .... One Half To One By Mouth Every 6 Hours ( One Whole At Night) 14)  Percocet 10-650 Mg Tabs (Oxycodone-Acetaminophen) .... One By Mouth Q 6 15)  Temazepam 30 Mg Caps (Temazepam) .Marland Kitchen.. 1 At Bedtime As Needed Sleep 16)  Vitamin D (Ergocalciferol) 50000 Unit Caps (Ergocalciferol) .... One By Mouth Weekly 17)  Cephalexin 500 Mg Caps (Cephalexin) .... One By Mouth Three Times A Day For 7 Days  Allergies (verified): 1)  ! Cipro  Past History:  Family History: Last updated: 10/16/2007 Family History High cholesterol  Social History: Last updated: 01/31/2007 Retired Married Former Smoker  Risk Factors: Smoking Status: quit > 6 months (07/02/2009)  Past medical, surgical, family and social histories (including risk factors) reviewed, and no changes noted (except as noted below).  Past Medical History: Reviewed history from 01/19/2007 and no changes required. Hyperlipidemia Hypertension Prostate cancer, hx of  Past Surgical History: Reviewed history from 01/31/2007 and no changes required. Knee replacement radiation to prostate  Family History: Reviewed history from 10/16/2007 and no changes required. Family History High cholesterol  Social History: Reviewed history from 01/31/2007  and no changes required. Retired Married Former Smoker  Review of Systems  The patient denies anorexia, fever, weight loss, weight gain, vision loss, decreased hearing, hoarseness, chest pain, syncope, dyspnea on exertion, peripheral edema, prolonged cough, headaches, hemoptysis, abdominal pain, melena, hematochezia, severe indigestion/heartburn, hematuria, incontinence, genital sores, muscle weakness, suspicious skin lesions, transient blindness, difficulty walking, depression, unusual weight change, abnormal bleeding, enlarged lymph nodes, angioedema, breast masses, and testicular masses.    Physical Exam  General:  anxious appearing.   Head:  normocephalic and atraumatic.   Eyes:  pupils equal and pupils reactive to light.   Ears:  R ear normal, L ear normal, and no external deformities.   Nose:  no external erythema.   Mouth:  Oral mucosa and oropharynx without lesions or exudates.  Teeth in good repair. Neck:  No deformities, masses, or tenderness noted. Lungs:  normal respiratory effort.   increased bronchjial breath sounds, no wheezing Heart:  Normal rate and regular  rhythm. S1 and S2 normal without gallop. Abdomen:  soft, non-tender, and normal bowel sounds.   Msk:  joint tenderness, joint swelling, and joint warmth.  cording behind the the left ankle with pain and erythema Pulses:  R and L carotid,radial,femoral,dorsalis pedis and posterior tibial pulses are full and equal bilaterally Extremities:  No clubbing, cyanosis, edema, or deformity noted with normal full range of motion of all joints.     Impression & Recommendations:  Problem # 1:  CELLULITIS, LEG, LEFT (ICD-682.6)  The following medications were removed from the medication list:    Cipro 500 Mg Tabs (Ciprofloxacin hcl) ..... One two times a day for 10 days. His updated medication list for this problem includes:    Cephalexin 500 Mg Caps (Cephalexin) ..... One by mouth three times a day for 7 days  Elevate  affected area. Warm moist compresses for 20 minutes every 2 hours while awake. Take antibiotics as directed and take acetaminophen as needed. To be seen in 48-72 hours if no improvement, sooner if worse.  Problem # 2:  PHLEBITIS AND THROMBOPHLEBITIS OF OTHER SITE (ICD-451.89)  Orders: Doppler Referral (Doppler)  Complete Medication List: 1)  Benicar Hct 40-12.5 Mg Tabs (Olmesartan medoxomil-hctz) .... Take 1 tablet by mouth once a day 2)  Vytorin 10-40 Mg Tabs (Ezetimibe-simvastatin) .... Once daily 3)  Prednisone 5 Mg Tabs (Prednisone) .... Take 1 tablet by mouth once a day 4)  Requip 2 Mg Tabs (Ropinirole hcl) .Marland Kitchen.. 1 once daily 5)  Zyrtec Allergy 10 Mg Tabs (Cetirizine hcl) .... As needed 6)  Nabumetone 500 Mg Tabs (Nabumetone) .... Once daily 7)  Zegerid 40-1100 Mg Caps (Omeprazole-sodium bicarbonate) .... One by mouth q hs 8)  Amlodipine Besylate 5 Mg Tabs (Amlodipine besylate) .... One by mouth daqily 9)  Hydrocodone-acetaminophen 5-325 Mg Tabs (Hydrocodone-acetaminophen) .... One by mouth q hs 10)  Voltaren 1 % Gel (Diclofenac sodium) .... To knees prn 11)  Lotrisone 1-0.05 % Crea (Clotrimazole-betamethasone) .... Apply to affected area twice a day for 7 to 14 days 12)  Prednisone 5 Mg Tabs (Prednisone) .Marland Kitchen.. 1 once daily 13)  Skelaxin 800 Mg Tabs (Metaxalone) .... One half to one by mouth every 6 hours ( one whole at night) 14)  Percocet 10-650 Mg Tabs (Oxycodone-acetaminophen) .... One by mouth q 6 15)  Temazepam 30 Mg Caps (Temazepam) .Marland Kitchen.. 1 at bedtime as needed sleep 16)  Vitamin D (ergocalciferol) 50000 Unit Caps (Ergocalciferol) .... One by mouth weekly 17)  Cephalexin 500 Mg Caps (Cephalexin) .... One by mouth three times a day for 7 days  Patient Instructions: 1)  This is most likely phlebitis for the gathering of the compression hose but infection is still of concern 2)  so we will cover with an antibiotic and recommend moist heat for 15 min with elevation at least three  times a day 3)  if the redness and pain worsens will will order a venous dopler 4)  ( CALL WITH STATUS BY FRIDAY AM) Prescriptions: CEPHALEXIN 500 MG CAPS (CEPHALEXIN) ONE by mouth three times a day FOR 7 DAYS  #21 x 0   Entered and Authorized by:   Stacie Glaze MD   Signed by:   Stacie Glaze MD on 07/02/2009   Method used:   Electronically to        [facility] [address] (retail)       [address]       [facility]  [place], [place]  [address]       Ph: [phone] or [phone]       Fax: [phone]   RxID:   [id number]

## 2010-07-23 NOTE — Letter (Signed)
Summary: [facility]  [facility]   Imported By: Maryln Gottron 03/16/2010 15:55:12  _____________________________________________________________________  External Attachment:    Type:   Image     Comment:   External Document

## 2010-07-23 NOTE — Procedures (Signed)
Summary: [facility]   Current Medications (verified): 1)  Benicar Hct 40-12.5 Mg  Tabs (Olmesartan Medoxomil-Hctz) .... Take 1 Tablet By Mouth Once A Day 2)  Vytorin 10-40 Mg  Tabs (Ezetimibe-Simvastatin) .... Once Daily 3)  Prednisone 5 Mg  Tabs (Prednisone) .... Take 1 Tablet By Mouth Once A Day 4)  Requip 2 Mg Tabs (Ropinirole Hcl) .Marland Kitchen.. 1 Once Daily 5)  Nabumetone 500 Mg  Tabs (Nabumetone) .... Once Daily 6)  Amlodipine Besylate 5 Mg  Tabs (Amlodipine Besylate) .... One By Mouth Daqily 7)  Temazepam 30 Mg Caps (Temazepam) .Marland Kitchen.. 1 At Bedtime As Needed Sleep 8)  Vitamin D (Ergocalciferol) 50000 Unit Caps (Ergocalciferol) .... One By Mouth Weekly 9)  Pantoprazole Sodium 40 Mg Tbec (Pantoprazole Sodium) .... One By Mouth Bid  Allergies (verified): 1)  ! Cipro    ILR Following MD Lewayne Bunting, MD DOI:  01/30/2010 Vendor:  [employer]     Model Number:  [id number]     Serial Number [id number]       Tachy Episodes:  3     Brady Episodes:  21 ILR Next Due 05/21/2010  Tech Comments:  3 AF EPISODES -- NOISE. 21  ASYSTOLE EPISODES-- NOISE.  PT HAS HAD NO SYMPTOMS.  PT TO CALL IF THERE ARE ANY EPISODES.  OTHERWISE ROV IN 3 MTHS W/GT. Vella Kohler  February 16, 2010 11:44 AM

## 2010-07-23 NOTE — Letter (Signed)
Summary: [facility]   Imported By: Maryln Gottron 03/19/2010 14:43:29  _____________________________________________________________________  External Attachment:    Type:   Image     Comment:   External Document

## 2010-07-23 NOTE — Progress Notes (Signed)
Summary: LAB RESULTS  Phone Note Call from Patient Call back at [phone]   Caller: Patient Call For: Stacie Glaze MD Complaint: Breathing Problems Summary of Call: Pt would like glucose test results. Initial call taken by: Heron Sabins,  January 08, 2010 12:32 PM  Follow-up for Phone Call        per dr Lovell Sheehan- 3 hr gtt is normal-pt informed Follow-up by: Willy Eddy, LPN,  January 08, 2010 1:06 PM

## 2010-07-23 NOTE — Cardiovascular Report (Signed)
Summary: Office Visit   Office Visit   Imported By: Roderic Ovens 06/24/2010 12:25:07  _____________________________________________________________________  External Attachment:    Type:   Image     Comment:   External Document

## 2010-07-23 NOTE — Letter (Signed)
Summary: Implantable Device Instructions  Architectural technologist, Main Office  [address]   [place], [place] [address]   Phone: [phone]  Fax: [phone]      Implantable Device Instructions  You are scheduled for:  _x____ Permanent Transvenous Pacemaker _____ Implantable Cardioverter Defibrillator _____ Implantable Loop Recorder _____ Generator Change  on June 04, 2010 at 10:00 with Dr. Ladona Ridgel.  1.  Please arrive at the Short Stay Center at [facility] at 8:00 am on the day of your procedure.  2.  Do not eat or drink the night before your procedure.  3.  Complete lab work today.  4.  Do NOT take these medications the day of your procedure:  Benicar  5.  Plan for an overnight stay.  Bring your insurance cards and a list of your medications.  6.  Wash your chest and neck with antibacterial soap (any brand) the evening before and the morning of your procedure.  Rinse well.    *If you have ANY questions after you get home, please call the office [phone].  Dennis Bast, RN  *Every attempt is made to prevent procedures from being rescheduled.  Due to the nauture of Electrophysiology, rescheduling can happen.  The physician is always aware and directs the staff when this occurs.

## 2010-07-23 NOTE — Progress Notes (Signed)
Summary: appt today if possible  Phone Note Other Incoming   Caller: rhonda Barrett Summary of Call: Per Samara Deist wants pt seen today if possible. pt is on his way home from [place], take about 3 hours to get here. Samara Deist done a phone note on this pt. please call pt @ [phone] Pt wife [name].  Initial call taken by: Edman Circle,  January 02, 2010 8:07 AM  Follow-up for Phone Call        Spoke with pt's wife. Wife states pt. is out of town. He is comming back today. She spoke with Joni Reining this AM regarding her husband syncope episode last night. [name] states for the last few weeks during the night, pt. has not been feeling well. He c/o of been swetty, flushy and weak heart rate irregular. Pt. would like to be seen in office today.  Ollen Gross, RN, BSN  January 02, 2010 8:43 AM LMTCB. Ollen Gross, RN, BSN  January 02, 2010 9:07 AM Spoke with wife. The DOD is unable to seen pt. today. Pt. to seen Dr. Ladona Ridgel on Monday July 18th at 9:00 AM.  I adviced [name], If pt. has another syncope  episode he needs to go to ER. Wife verbalized understanding.   Follow-up by: Ollen Gross, RN, BSN,  January 02, 2010 9:14 AM

## 2010-07-23 NOTE — Procedures (Signed)
Summary: pacer check/medtronic   Current Medications (verified): 1)  Benicar Hct 40-12.5 Mg  Tabs (Olmesartan Medoxomil-Hctz) .... Take 1 Tablet By Mouth Once A Day 2)  Prednisone 5 Mg  Tabs (Prednisone) .... Take 1 Tablet By Mouth Once A Day 3)  Requip 2 Mg Tabs (Ropinirole Hcl) .Marland Kitchen.. 1 Once Daily 4)  Nabumetone 500 Mg  Tabs (Nabumetone) .... Two Times A Day 5)  Amlodipine Besylate 5 Mg  Tabs (Amlodipine Besylate) .... One By Mouth Daqily 6)  Temazepam 30 Mg Caps (Temazepam) .Marland Kitchen.. 1 At Bedtime As Needed Sleep 7)  Vitamin D (Ergocalciferol) 50000 Unit Caps (Ergocalciferol) .... One By Mouth Weekly 8)  Pantoprazole Sodium 40 Mg Tbec (Pantoprazole Sodium) .... One By Mouth Bid 9)  Triamcinolone Acetonide 0.1 % Lotn (Triamcinolone Acetonide) .... Mix 1to 4 Ration in Eucerin Lotion and Aplly To The Effected Areas 10)  Nu-Iron 150 Mg Caps (Polysaccharide Iron Complex) .Marland Kitchen.. 1 Once Daily For 2 Months 11)  Tac/eucerin Compound .... Use As Directed  Allergies (verified): 1)  ! Cipro  PPM Specifications Following MD:  Lewayne Bunting, MD     PPM Vendor:  [employer]     PPM Model Number:  [id number]     PPM Serial Number:  [id number] PPM DOI:  06/04/2010     PPM Implanting MD:  Lewayne Bunting, MD  Lead 1    Location: RA     DOI: 06/04/2010     Model #: [id number]     Serial #: [id number]     Status: active Lead 2    Location: RV     DOI: 06/04/2010     Model #: [id number]     Serial #: [id number]     Status: active  Magnet Response Rate:  BOL 85 ERI 65  Indications:  Syncope   PPM Follow Up Remote Check?  No Battery Voltage:  2.80 V     Battery Est. Longevity:  12 years       PPM Device Measurements [facility]  Amplitude: 4.0 mV, Impedance: 478 ohms, Threshold: 0.5 V at 0.4 msec Right Ventricle  Amplitude: 4.0 mV, Impedance: 390 ohms, Threshold: 1.0 V at 0.4 msec  Episodes MS Episodes:  1     Percent Mode Switch:  <0.1%     Coumadin:  No Ventricular High Rate:  1     Atrial Pacing:  40.6%     Ventricular Pacing:   3.8%  Parameters Mode:  DDDR+     Lower Rate Limit:  60     Upper Rate Limit:  130 Paced AV Delay:  180     Sensed AV Delay:  150 Tech Comments:  Steri strips removed from both pacer and loop explant site, no redness or edema noted.  1VHR episode3 seconds probable VT.  17637single PVC's noted.  Mr. Crisafulli is very aware of the PVC's.  No parameter changes.  Device function normal.  ROV 3 months with Dr. Ladona Ridgel. Altha Harm, LPN  June 12, 2010 2:38 PM   ILR Following MD Lewayne Bunting, MD DOI:  01/30/2010 Vendor:  [employer]     Model Number:  9529     Serial Number [id number]

## 2010-07-23 NOTE — Assessment & Plan Note (Signed)
Summary: cellulitis/dm   Vital Signs:  Patient profile:   77 year old male Weight:      228 pounds Temp:     97.9 degrees F oral BP sitting:   118 / 52  (left arm) Cuff size:   large  Vitals Entered By: Alfred Levins, CMA (April 15, 2010 11:40 AM) CC: cellulitis lt ankle   CC:  cellulitis lt ankle.  Problems Prior to Update: 1)  Iron Defic Anemia Sec Diet Iron Intake  (ICD-280.1) 2)  Psoriasis  (ICD-696.1) 3)  Headache, Persistent  (ICD-784.0) 4)  Hematuria Unspecified  (ICD-599.70) 5)  Cellulitis, Leg, Left  (ICD-682.6) 6)  Phlebitis and Thrombophlebitis of Other Site  (ICD-451.89) 7)  Disuse Osteoporosis  (ICD-733.03) 8)  Hyperglycemia, Borderline  (ICD-790.29) 9)  Spondylosis, Lumbar, With Radiculopathy  (ICD-756.11) 10)  Other Specified Dermatomycoses  (ICD-111.8) 11)  Extrinsic Asthma, With Exacerbation  (ICD-493.02) 12)  Calcu Gallbladd W/oth Cholecystitis&obstruction  (ICD-574.11) 13)  Varicose Veins Lower Extremities W/inflammation  (ICD-454.1) 14)  Edema  (ICD-782.3) 15)  Preventive Health Care  (ICD-V70.0) 16)  Insomnia With Sleep Apnea Unspecified  (ICD-780.51) 17)  Gerd  (ICD-530.81) 18)  Psoriatic Arthritis  (ICD-696.0) 19)  Fibrillation, Atrial  (ICD-427.31) 20)  Abdominal Bloating  (ICD-787.3) 21)  Hyperlipidemia  (ICD-272.4) 22)  Prostate Cancer, Hx of  (ICD-V10.46) 23)  Hypertension  (ICD-401.9) 24)  Hyperlipidemia  (ICD-272.4)  Current Problems (verified): 1)  Headache, Persistent  (ICD-784.0) 2)  Hematuria Unspecified  (ICD-599.70) 3)  Cellulitis, Leg, Left  (ICD-682.6) 4)  Phlebitis and Thrombophlebitis of Other Site  (ICD-451.89) 5)  Disuse Osteoporosis  (ICD-733.03) 6)  Hyperglycemia, Borderline  (ICD-790.29) 7)  Spondylosis, Lumbar, With Radiculopathy  (ICD-756.11) 8)  Other Specified Dermatomycoses  (ICD-111.8) 9)  Extrinsic Asthma, With Exacerbation  (ICD-493.02) 10)  Calcu Gallbladd W/oth Cholecystitis&obstruction  (ICD-574.11) 11)   Varicose Veins Lower Extremities W/inflammation  (ICD-454.1) 12)  Edema  (ICD-782.3) 13)  Preventive Health Care  (ICD-V70.0) 14)  Insomnia With Sleep Apnea Unspecified  (ICD-780.51) 15)  Gerd  (ICD-530.81) 16)  Psoriatic Arthritis  (ICD-696.0) 17)  Fibrillation, Atrial  (ICD-427.31) 18)  Abdominal Bloating  (ICD-787.3) 19)  Hyperlipidemia  (ICD-272.4) 20)  Prostate Cancer, Hx of  (ICD-V10.46) 21)  Hypertension  (ICD-401.9) 22)  Hyperlipidemia  (ICD-272.4)  Medications Prior to Update: 1)  Benicar Hct 40-12.5 Mg  Tabs (Olmesartan Medoxomil-Hctz) .... Take 1 Tablet By Mouth Once A Day 2)  Vytorin 10-40 Mg  Tabs (Ezetimibe-Simvastatin) .... Once Daily 3)  Prednisone 5 Mg  Tabs (Prednisone) .... Take 1 Tablet By Mouth Once A Day 4)  Requip 2 Mg Tabs (Ropinirole Hcl) .Marland Kitchen.. 1 Once Daily 5)  Nabumetone 500 Mg  Tabs (Nabumetone) .... Once Daily 6)  Amlodipine Besylate 5 Mg  Tabs (Amlodipine Besylate) .... One By Mouth Daqily 7)  Temazepam 30 Mg Caps (Temazepam) .Marland Kitchen.. 1 At Bedtime As Needed Sleep 8)  Vitamin D (Ergocalciferol) 50000 Unit Caps (Ergocalciferol) .... One By Mouth Weekly 9)  Pantoprazole Sodium 40 Mg Tbec (Pantoprazole Sodium) .... One By Mouth Bid  Current Medications (verified): 1)  Benicar Hct 40-12.5 Mg  Tabs (Olmesartan Medoxomil-Hctz) .... Take 1 Tablet By Mouth Once A Day 2)  Vytorin 10-40 Mg  Tabs (Ezetimibe-Simvastatin) .... Once Daily 3)  Prednisone 5 Mg  Tabs (Prednisone) .... Take 1 Tablet By Mouth Once A Day 4)  Requip 2 Mg Tabs (Ropinirole Hcl) .Marland Kitchen.. 1 Once Daily 5)  Nabumetone 500 Mg  Tabs (Nabumetone) .... Once Daily 6)  Amlodipine Besylate 5 Mg  Tabs (Amlodipine Besylate) .... One By Mouth Daqily 7)  Temazepam 30 Mg Caps (Temazepam) .Marland Kitchen.. 1 At Bedtime As Needed Sleep 8)  Vitamin D (Ergocalciferol) 50000 Unit Caps (Ergocalciferol) .... One By Mouth Weekly 9)  Pantoprazole Sodium 40 Mg Tbec (Pantoprazole Sodium) .... One By Mouth Bid 10)  Doxycycline Hyclate 100 Mg  Caps (Doxycycline Hyclate) .... Take 1 Tab Twice A Day  Allergies (verified): 1)  ! Cipro  Past History:  Family History: Last updated: 10/16/2007 Family History High cholesterol  Social History: Last updated: 01/31/2007 Retired Married Former Smoker  Risk Factors: Smoking Status: quit > 6 months (08/20/2009)  Past medical, surgical, family and social histories (including risk factors) reviewed, and no changes noted (except as noted below).  Past Medical History: Reviewed history from 01/19/2007 and no changes required. Hyperlipidemia Hypertension Prostate cancer, hx of  Past Surgical History: Reviewed history from 01/31/2007 and no changes required. Knee replacement radiation to prostate  Family History: Reviewed history from 10/16/2007 and no changes required. Family History High cholesterol  Social History: Reviewed history from 01/31/2007 and no changes required. Retired Married Former Smoker  Review of Systems       The patient complains of peripheral edema.  The patient denies anorexia, fever, weight loss, weight gain, vision loss, decreased hearing, hoarseness, chest pain, syncope, dyspnea on exertion, prolonged cough, headaches, hemoptysis, abdominal pain, melena, hematochezia, severe indigestion/heartburn, hematuria, incontinence, genital sores, muscle weakness, suspicious skin lesions, transient blindness, difficulty walking, depression, unusual weight change, abnormal bleeding, enlarged lymph nodes, angioedema, breast masses, and testicular masses.    Physical Exam  General:  Well-developed,well-nourished,in no acute distress; alert,appropriate and cooperative throughout examination Eyes:  pupils equal and pupils round.   Ears:  R ear normal and L ear normal.   Neck:  No deformities, masses, or tenderness noted. Lungs:  Normal respiratory effort, chest expands symmetrically. Lungs are clear to auscultation, no crackles or wheezes. Heart:  normal rate  and regular rhythm.   Abdomen:  soft, non-tender, normal bowel sounds, no distention, and no masses.   Extremities:  warmth redness and swelling of the left lower extremity extending to mid calf tender   Impression & Recommendations:  Problem # 1:  CELLULITIS, LEG, LEFT (ICD-682.6)  rocephin doxycycline  His updated medication list for this problem includes:    Doxycycline Hyclate 100 Mg Caps (Doxycycline hyclate) .Marland Kitchen... Take 1 tab twice a day follow up as scheduled with dr Lovell Sheehan in 1 week  Orders: Rocephin  250mg  (Z6109) Admin of Therapeutic Inj  intramuscular or subcutaneous ([address])  Problem # 2:  EDEMA (ICD-782.3)  His updated medication list for this problem includes:    Benicar Hct 40-12.5 Mg Tabs (Olmesartan medoxomil-hctz) .Marland Kitchen... Take 1 tablet by mouth once a day  Discussed elevation of the legs, use of compression stockings, sodium restiction, and medication use.   Problem # 3:  HYPERTENSION (ICD-401.9)  His updated medication list for this problem includes:    Benicar Hct 40-12.5 Mg Tabs (Olmesartan medoxomil-hctz) .Marland Kitchen... Take 1 tablet by mouth once a day    Amlodipine Besylate 5 Mg Tabs (Amlodipine besylate) ..... One by mouth daqily  BP today: 118/52 Prior BP: 150/68 (01/13/2010)  Prior 10 Yr Risk Heart Disease: 27 % (10/17/2008)  Labs Reviewed: K+: 3.7 (01/26/2010) Creat: : 1.2 (01/26/2010)   Chol: 198 (05/07/2009)   HDL: 47.90 (05/07/2009)   LDL: 123 (05/07/2009)   TG: 134.0 (05/07/2009)  Complete Medication List: 1)  Benicar Hct 40-12.5  Mg Tabs (Olmesartan medoxomil-hctz) .... Take 1 tablet by mouth once a day 2)  Vytorin 10-40 Mg Tabs (Ezetimibe-simvastatin) .... Once daily 3)  Prednisone 5 Mg Tabs (Prednisone) .... Take 1 tablet by mouth once a day 4)  Requip 2 Mg Tabs (Ropinirole hcl) .Marland Kitchen.. 1 once daily 5)  Nabumetone 500 Mg Tabs (Nabumetone) .... Once daily 6)  Amlodipine Besylate 5 Mg Tabs (Amlodipine besylate) .... One by mouth daqily 7)  Temazepam  30 Mg Caps (Temazepam) .Marland Kitchen.. 1 at bedtime as needed sleep 8)  Vitamin D (ergocalciferol) 50000 Unit Caps (Ergocalciferol) .... One by mouth weekly 9)  Pantoprazole Sodium 40 Mg Tbec (Pantoprazole sodium) .... One by mouth bid 10)  Doxycycline Hyclate 100 Mg Caps (Doxycycline hyclate) .... Take 1 tab twice a day 11)  Triamcinolone Acetonide 0.1 % Lotn (Triamcinolone acetonide) .... Mix 1to 4 ration in eucerin lotion and aplly to the effected areas  Other Orders: LE Arterial Doppler/ABI (Le arterial doppler) Specimen Handling ([address])  Patient Instructions: 1)  keep apointment Prescriptions: DOXYCYCLINE HYCLATE 100 MG CAPS (DOXYCYCLINE HYCLATE) Take 1 tab twice a day  #20 x 0   Entered and Authorized by:   Birdie Sons MD   Signed by:   Birdie Sons MD on 04/15/2010   Method used:   Electronically to        [facility] [address] (retail)       [address]       [place], [place]  [address]       Ph: [phone] or [phone]       Fax: [phone]   RxID:   [id number]    Medication Administration  Injection # 1:    Medication: Rocephin  250mg     Diagnosis: CELLULITIS, LEG, LEFT (ICD-682.6)    Route: IM    Site: RUOQ gluteus    Exp Date: 10/2012    Lot #: [id number]    Mfr: Sandoz    Comments: 1g    Patient tolerated injection without complications    Given by: Alfred Levins, CMA (April 15, 2010 12:58 PM)  Orders Added: 1)  Rocephin  250mg  [J0696] 2)  Admin of Therapeutic Inj  intramuscular or subcutaneous [96372] 3)  LE Arterial Doppler/ABI [Le arterial doppler] 4)  Specimen Handling [99000] 5)  Est. Patient Level IV [47425]

## 2010-07-23 NOTE — Letter (Signed)
Summary: Implantable Device Instructions  Architectural technologist, Main Office  [address]   [place], [place] [address]   Phone: [phone]  Fax: [phone]      Implantable Device Instructions  You are scheduled for:  ___x_ Permanent Transvenous Pacemaker _____ Implantable Cardioverter Defibrillator _____ Implantable Loop Recorder _____ Generator Change  on June 18, 2010 at 7:30 am with Dr. Ladona Ridgel.  1.  Please arrive at the Short Stay Center at [name] at 5:30 am on the day of your procedure.  2.  Do not eat or drink after midnight the night before your procedure.  3.  Complete lab work on June 16, 2010 at 9:00 am.  The lab at [address] is open from 8:30 AM to 1:30 PM and from 2:30 PM to 5:00 PM.  The lab at [facility] is open from 7:30 AM to 5:30 PM.  You do not have to be fasting.  4.  Do NOT take these medications thee morning of your procedure:  Benicar.f  5.  Plan for an overnight stay.  Bring your insurance cards and a list of your medications.  6.  Wash your chest and neck with antibacterial soap (any brand) the evening before and the morning of your procedure.  Rinse well.   *If you have ANY questions after you get home, please call the office [phone]. Dennis Bast, RN  *Every attempt is made to prevent procedures from being rescheduled.  Due to the nauture of Electrophysiology, rescheduling can happen.  The physician is always aware and directs the staff when this occurs.

## 2010-07-23 NOTE — Assessment & Plan Note (Signed)
Summary: Headaches ? pertaining to AFIB/cjr   Vital Signs:  Patient profile:   77 year old male Temp:     98.0 degrees F oral Pulse rate:   60 / minute Pulse rhythm:   regular Resp:     12 per minute BP sitting:   150 / 68  (left arm) Cuff size:   large  Vitals Entered By: Sid Falcon LPN (January 13, 2010 12:04 PM)  History of Present Illness: Patient seen same day appointment with left parietal headache.  Patient gives history that one week ago he was in a motel room and had sudden dizziness, diaphoresis,  and syncope. He recalls feeling diaphoretic and weak and subsequent syncopal episode. When he became conscious he noticed slow heart rate. Currently being evaluated by electrophysiologist. Patient had no further episodes of syncope.  Headache has been going on for 3 months and he states progressive. Throbbing/sharp sensation which is very localized left parietal region. He has not had any visual disturbance, speech changes, or focal weakness. No alleviating factors. Worse with bending over. 4/10 severity. Headaches are now daily. Has taken no medications for his headache. He has noted some localized tenderness to palpation over the scalp. No rashes.  no recent head injury  Otherwise past medical history significant for hypertension, dyslipidemia, GERD and some chronic insomnia.  Remote hx prostate ca and normal recent PSA.  Allergies: 1)  ! Cipro  Past History:  Past Medical History: Last updated: 01/19/2007 Hyperlipidemia Hypertension Prostate cancer, hx of  Past Surgical History: Last updated: 01/31/2007 Knee replacement radiation to prostate  Family History: Last updated: 10/16/2007 Family History High cholesterol  Social History: Last updated: 01/31/2007 Retired Married Former Smoker  Risk Factors: Smoking Status: quit > 6 months (08/20/2009)  Review of Systems  The patient denies anorexia, fever, weight loss, vision loss, decreased hearing, chest pain,  prolonged cough, transient blindness, difficulty walking, and depression.    Physical Exam  General:  Well-developed,well-nourished,in no acute distress; alert,appropriate and cooperative throughout examination Head:  Normocephalic and atraumatic without obvious abnormalities. No apparent alopecia or balding. Eyes:  No corneal or conjunctival inflammation noted. EOMI. Perrla. Funduscopic exam benign, without hemorrhages, exudates or papilledema. Vision grossly normal. Ears:  External ear exam shows no significant lesions or deformities.  Otoscopic examination reveals clear canals, tympanic membranes are intact bilaterally without bulging, retraction, inflammation or discharge. Hearing is grossly normal bilaterally. Mouth:  Oral mucosa and oropharynx without lesions or exudates.  Teeth in good repair. Neck:  No deformities, masses, or tenderness noted. Lungs:  Normal respiratory effort, chest expands symmetrically. Lungs are clear to auscultation, no crackles or wheezes. Heart:  normal rate and regular rhythm.   Neurologic:  alert & oriented X3, cranial nerves II-XII intact, strength normal in all extremities, gait normal, DTRs symmetrical and normal, and finger-to-nose normal.   Skin:  no scalp or facial rash. Cervical Nodes:  No lymphadenopathy noted Psych:  normally interactive, good eye contact, not anxious appearing, and not depressed appearing.     Impression & Recommendations:  Problem # 1:  HEADACHE, PERSISTENT (ICD-784.0)  Patient has very localized somewhat atypical headache. This may be somewhat of a neuralgia-type process given the fact he does have some localized tenderness. Red flags include age, progressive worsening, history of syncope, and worse with position change.  Given mutiple red flags will obtain furhter imaging.  He does have hx of TKR and lumbar fusion but no pacemaker or other small metal. His updated medication list for this  problem includes:    Nabumetone 500 Mg  Tabs (Nabumetone) ..... Once daily  Orders: Radiology Referral (Radiology)  Complete Medication List: 1)  Benicar Hct 40-12.5 Mg Tabs (Olmesartan medoxomil-hctz) .... Take 1 tablet by mouth once a day 2)  Vytorin 10-40 Mg Tabs (Ezetimibe-simvastatin) .... Once daily 3)  Prednisone 5 Mg Tabs (Prednisone) .... Take 1 tablet by mouth once a day 4)  Requip 2 Mg Tabs (Ropinirole hcl) .Marland Kitchen.. 1 once daily 5)  Nabumetone 500 Mg Tabs (Nabumetone) .... Once daily 6)  Zegerid 40-1100 Mg Caps (Omeprazole-sodium bicarbonate) .... One by mouth q hs 7)  Amlodipine Besylate 5 Mg Tabs (Amlodipine besylate) .... One by mouth daqily 8)  Temazepam 30 Mg Caps (Temazepam) .Marland Kitchen.. 1 at bedtime as needed sleep 9)  Vitamin D (ergocalciferol) 50000 Unit Caps (Ergocalciferol) .... One by mouth weekly  Patient Instructions: 1)  Follow up promptly for any vomiting, confusion, visual changes, or any focal weakness.

## 2010-07-23 NOTE — Letter (Signed)
Summary: Implantable Device Instructions  Architectural technologist, Main Office  [address]   [place], [place] [address]   Phone: [phone]  Fax: [phone]      Implantable Device Instructions  You are scheduled for:   _____ Implantable Loop Recorder   on8/12/11 with Dr. Dr Ladona Ridgel.  1.  Please arrive at the Short Stay Center at [facility] at 12:00pm on the day of your procedure.  2.  Clear liquid breakfast.  Nothing to eat or drink after 7:00am the morning of your procedure.  3.  Complete lab work on 01/26/10 at8;30am.    You do not have to be fasting.   4.  Plan for an overnight stay.  Bring your insurance cards and a list of your medications.  6.  Wash your chest and neck with antibacterial soap (any brand) the evening before and the morning of your procedure.  Rinse well.  *If you have ANY questions after you get home, please call the office [phone]. Anselm Pancoast  *Every attempt is made to prevent procedures from being rescheduled.  Due to the nauture of Electrophysiology, rescheduling can happen.  The physician is always aware and directs the staff when this occurs.

## 2010-07-23 NOTE — Miscellaneous (Signed)
Summary: Device change out  Clinical Lists Changes  Observations: Added new observation of PPM INDICATN: Syncope (06/05/2010 13:49) Added new observation of MAGNET RTE: BOL 85 ERI 65 (06/05/2010 13:49) Added new observation of PPMLEADSTAT2: active (06/05/2010 13:49) Added new observation of PPMLEADSER2: [id number] (06/05/2010 13:49) Added new observation of PPMLEADMOD2: 4076  (06/05/2010 13:49) Added new observation of PPMLEADLOC2: RV  (06/05/2010 13:49) Added new observation of PPMLEADSTAT1: active  (06/05/2010 13:49) Added new observation of PPMLEADSER1: [id number]  (06/05/2010 13:49) Added new observation of PPMLEADMOD1: 4076  (06/05/2010 13:49) Added new observation of PPMLEADLOC1: RA  (06/05/2010 13:49) Added new observation of PPM IMP MD: Lewayne Bunting, MD  (06/05/2010 13:49) Added new observation of PPMLEADDOI2: 06/04/2010  (06/05/2010 13:49) Added new observation of PPMLEADDOI1: 06/04/2010  (06/05/2010 13:49) Added new observation of PPM DOI: 06/04/2010  (06/05/2010 13:49) Added new observation of PPM SERL#: [id number]  (06/05/2010 13:49) Added new observation of PPM MODL#: [id number]  (06/05/2010 95:28) Added new observation of PACEMAKERMFG: [employer]onic  (06/05/2010 13:49) Added new observation of PACEMAKER MD: Lewayne Bunting, MD  (06/05/2010 13:49) Added new observation of ILR MD COMM: 06/04/10 Loop explanted  (06/05/2010 13:49)      PPM Specifications Following MD:  Lewayne Bunting, MD     PPM Vendor:  [employer]tronic     PPM Model Number:  [id number]     PPM Serial Number:  [id number] PPM DOI:  06/04/2010     PPM Implanting MD:  Lewayne Bunting, MD  Lead 1    Location: RA     DOI: 06/04/2010     Model #: [id number]     Serial #: [id number]     Status: active Lead 2    Location: RV     DOI: 06/04/2010     Model #: [id number]     Serial #: [id number]     Status: active  Magnet Response Rate:  BOL 85 ERI 65  Indications:  Syncope   ILR Following MD Lewayne Bunting, MD DOI:  01/30/2010 Vendor:   [employer]onic     Model Number:  [id number]     Serial Number [id number]        MD Comments:  06/04/10 Loop explanted

## 2010-07-23 NOTE — Miscellaneous (Signed)
Summary: Orders Update  Clinical Lists Changes  Orders: Added new Test order of Venous Duplex Lower Extremity (Venous Duplex Lower) - Signed 

## 2010-07-23 NOTE — Assessment & Plan Note (Signed)
Summary: 3 MONTH/[name]   Visit Type:  3 month follow up Primary Provider:  Stacie Glaze MD  CC:  heart skipping.  History of Present Illness: Christopher Baker returns today for followup.  He is a pleasant 77 yo man with a h/o SVT s/p ablation, atrial fibrillation and unexplained syncope.  He underwent insertion of an ILR several months ago.  He has not had recurrent syncope.  He also has a h/o hypertension and dyslipidemia.   He has felt well since then.  No c/p, sob, or peripheral edema.    Current Medications (verified): 1)  Benicar Hct 40-12.5 Mg  Tabs (Olmesartan Medoxomil-Hctz) .... Take 1 Tablet By Mouth Once A Day 2)  Vytorin 10-40 Mg  Tabs (Ezetimibe-Simvastatin) .... Once Daily 3)  Prednisone 5 Mg  Tabs (Prednisone) .... Take 1 Tablet By Mouth Once A Day 4)  Requip 2 Mg Tabs (Ropinirole Hcl) .Marland Kitchen.. 1 Once Daily 5)  Nabumetone 500 Mg  Tabs (Nabumetone) .... Two Times A Day 6)  Amlodipine Besylate 5 Mg  Tabs (Amlodipine Besylate) .... One By Mouth Daqily 7)  Temazepam 30 Mg Caps (Temazepam) .Marland Kitchen.. 1 At Bedtime As Needed Sleep 8)  Vitamin D (Ergocalciferol) 50000 Unit Caps (Ergocalciferol) .... One By Mouth Weekly 9)  Pantoprazole Sodium 40 Mg Tbec (Pantoprazole Sodium) .... One By Mouth Bid 10)  Triamcinolone Acetonide 0.1 % Lotn (Triamcinolone Acetonide) .... Mix 1to 4 Ration in Eucerin Lotion and Aplly To The Effected Areas 11)  Nu-Iron 150 Mg Caps (Polysaccharide Iron Complex) .Marland Kitchen.. 1 Once Daily For 2 Months  Allergies (verified): 1)  ! Cipro  Past History:  Past Medical History: Last updated: 01/19/2007 Hyperlipidemia Hypertension Prostate cancer, hx of  Past Surgical History: Last updated: 01/31/2007 Knee replacement radiation to prostate  Family History: Last updated: 10/16/2007 Family History High cholesterol  Social History: Last updated: 01/31/2007 Retired Married Former Smoker  Risk Factors: Smoking Status: quit > 6 months (04/20/2010)  Review of  Systems       The patient complains of syncope.  The patient denies chest pain, dyspnea on exertion, and peripheral edema.    Vital Signs:  Patient profile:   77 year old male Height:      75 inches Weight:      230.25 pounds BMI:     28.88 Pulse rate:   65 / minute BP sitting:   131 / 56  (left arm) Cuff size:   large  Vitals Entered By: Caralee Ates CMA (June 01, 2010 1:46 PM)  Physical Exam  General:  alert, well-developed, and well-nourished.   Head:  normocephalic and atraumatic.   Eyes:  pupils equal and pupils round.   Mouth:  Oral mucosa and oropharynx without lesions or exudates.  Teeth in good repair. Neck:  No deformities, masses, or tenderness noted. Chest Wall:  Well healed ILR. Lungs:  normal respiratory effort and no wheezes.   Heart:  normal rate and regular rhythm.   Abdomen:  soft and non-tender.   Msk:  TKR rght  Pulses:  R and L carotid,radial,femoral,dorsalis pedis and posterior tibial pulses are full and equal bilaterally Extremities:  2+ left pedal edema and trace right pedal edema.   Neurologic:  alert & oriented X3 and gait normal.      ILR Following MD Lewayne Bunting, MD DOI:  01/30/2010 Vendor:  [employer]     Model Number:  [id number]     Serial Number [id number] Freddi Starr Episodes:  4  ILR Next Due 08/20/2010  Tech Comments:  7 symptom episodes were SR with PVC's..  There were 4 brady episodes and 1 pause 3 seconds. The paitent was asymptomatic with the episodes.  ROV 3 months clinic. Altha Harm, LPN  June 01, 2010 2:03 PM   MD Comments:  Agree with above.  Impression & Recommendations:  Problem # 1:  SYNCOPE (ICD-780.2) He has documented daytime pauses of over 3 seconds.  With his h/o syncope, I have recommended PPM.  The risks/benefits/goals/expectations of the procedure have been discussed and he wishes to proceed. His updated medication list for this problem includes:    Prednisone 5 Mg Tabs (Prednisone) .Marland Kitchen... Take 1 tablet by  mouth once a day    Amlodipine Besylate 5 Mg Tabs (Amlodipine besylate) ..... One by mouth daqily  Problem # 2:  FIBRILLATION, ATRIAL (ICD-427.31) His ventricular rate is controlled.  Will follow. Orders: TLB-BMP (Basic Metabolic Panel-BMET) (80048-METABOL) TLB-CBC Platelet - w/Differential (85025-CBCD) TLB-PT (Protime) (85610-PTP) TLB-PTT (85730-PTTL)  Patient Instructions: 1)  Your physician recommends that you continue on your current medications as directed. Please refer to the Current Medication list given to you today. 2)  Your physician has recommended that you have a pacemaker inserted.  A pacemaker is a small device that is placed under the skin of your chest or abdomen to help control abnormal heart rhythms. This device uses electrical pulses to prompt the heart to beat at a normal rate. Pacemakers are used to treat heart rhythms that are too slow. Wires (leads) are attached to the pacemaker that goes into the chambers of your heart. This is done in the hospital and usually requires an overnight stay. Please see the instruction sheet given to you today for more information. 3)  Your physician recommends that you have lab work today.

## 2010-07-23 NOTE — Progress Notes (Signed)
Summary: change Zegerid  Phone Note Call from Patient   Caller: Patient Call For: Stacie Glaze MD Reason for Call: Acute Illness Summary of Call: Increasing symptoms of heartburn x 2 weeks, and want to change from Zegerid.  Pharmacist told him it is not working well for people anymore. [employer] [place]  Needs to be done within one hour before he flies out of town? [phone] Initial call taken by: Lynann Beaver CMA,  February 06, 2010 11:02 AM  Follow-up for Phone Call        per dr Lovell Sheehan pantoprazole 40 two times a day  Follow-up by: Willy Eddy, LPN,  February 06, 2010 11:36 AM    New/Updated Medications: PANTOPRAZOLE SODIUM 40 MG TBEC (PANTOPRAZOLE SODIUM) one by mouth bid Prescriptions: PANTOPRAZOLE SODIUM 40 MG TBEC (PANTOPRAZOLE SODIUM) one by mouth bid  #60 x 1   Entered by:   Lynann Beaver CMA   Authorized by:   Stacie Glaze MD   Signed by:   Lynann Beaver CMA on 02/06/2010   Method used:   Electronically to        [facility] [address] (retail)       [address]       [place], [place]  [address]       Ph: [phone] or [phone]       Fax: [phone]   RxID:   [id number]  Pt. notified.

## 2010-07-23 NOTE — Progress Notes (Signed)
  Phone Note Call from Patient   Caller: Spouse Call For: Stacie Glaze MD Summary of Call: Pt was in a hotel room last night and passed out with rapid and irregular heart rate.  Woke up, and felt fine.  Is on the way home, and wants to see Dr. Lovell Sheehan today after 11 am.  Dr. Ladona Ridgel cannot see him today.  Does not know how long he was unconscious.  No symptoms today. [phone] Initial call taken by: Lynann Beaver CMA,  January 02, 2010 9:07 AM  Follow-up for Phone Call        needs to call cardiology and get him seen by doc of day or PA due to passing outt from rapid HR Follow-up by: Stacie Glaze MD,  January 02, 2010 11:08 AM  Additional Follow-up for Phone Call Additional follow up Details #1::        Pt was told from Cardiology to see YOU today.  They do not have anyone to see him today. Additional Follow-up by: Lynann Beaver CMA,  January 02, 2010 11:14 AM    Additional Follow-up for Phone Call Additional follow up Details #2::    Pt will go to [facility] ER over the weekend if symptomatic, and otherwise see Dr. Ladona Ridgel 9 am Monday, per Dr. Lovell Sheehan and Cardiology phone notes. Follow-up by: Lynann Beaver CMA,  January 02, 2010 12:19 PM

## 2010-07-23 NOTE — Letter (Signed)
Summary: [facility]  [facility]   Imported By: Maryln Gottron 08/26/2009 15:16:56  _____________________________________________________________________  External Attachment:    Type:   Image     Comment:   External Document

## 2010-07-23 NOTE — Cardiovascular Report (Signed)
Summary: Pre-Op Orders  Pre-Op Orders   Imported By: Marylou Mccoy 02/04/2010 09:02:58  _____________________________________________________________________  External Attachment:    Type:   Image     Comment:   External Document

## 2010-07-23 NOTE — Letter (Signed)
Summary: [facility]-Additional Note  [facility]-Additional Note   Imported By: Maryln Gottron 03/19/2010 15:04:56  _____________________________________________________________________  External Attachment:    Type:   Image     Comment:   External Document

## 2010-07-23 NOTE — Progress Notes (Signed)
  Phone Note Call from Patient   Summary of Call: Mrs.  Christopher Baker called on behalf of her husband, (the pt), Christopher Baker secondary to syncopal episode which happened last night.  The patient is out of town in [place], felt his heart racing and then had syncope spell.  He has a history of Afib ablation 2 years ago.  His wife says that he felt better once he "woke up" from syncopal episode but feels tired and weak.  She requests appointment to be seen in the office today. I will call the office to see if he can be seen as an add-on today.  I have advised her that he may need to come to ER or see primary care if we cannot see him in the office.  She does not want him to come to ER. Initial call taken by: Joni Reining NP

## 2010-07-23 NOTE — Letter (Signed)
Summary: [facility]  [facility]   Imported By: Maryln Gottron 11/04/2009 09:19:54  _____________________________________________________________________  External Attachment:    Type:   Image     Comment:   External Document

## 2010-07-23 NOTE — Assessment & Plan Note (Signed)
Summary: rov/per nividia/jml   Visit Type:  Initial Consult Primary Provider:  Stacie Glaze MD   History of Present Illness: Mr. Clos returns today for followup.  He is a pleasant 77 yo man with a h/o SVT s/p ablation.  He also has a h/o hypertension and dyslipidemia.  I saw him several months ago with recurrent palpitations and at that time it sounded more like dehydration causing his symptoms of near syncope.  He was in his usual state of health until several days ago when he became diaphoretic, felt palpitations, experienced light headed and then passed out.  When he awoke, he felt fine except that his heart was beating slow and regularly.  He has felt well since then.  No c/p, sob, or peripheral edema.  He played golf yesterday.  Current Medications (verified): 1)  Benicar Hct 40-12.5 Mg  Tabs (Olmesartan Medoxomil-Hctz) .... Take 1 Tablet By Mouth Once A Day 2)  Vytorin 10-40 Mg  Tabs (Ezetimibe-Simvastatin) .... Once Daily 3)  Prednisone 5 Mg  Tabs (Prednisone) .... Take 1 Tablet By Mouth Once A Day 4)  Requip 2 Mg Tabs (Ropinirole Hcl) .Marland Kitchen.. 1 Once Daily 5)  Nabumetone 500 Mg  Tabs (Nabumetone) .... Once Daily 6)  Zegerid 40-1100 Mg  Caps (Omeprazole-Sodium Bicarbonate) .... One By Mouth Q Hs 7)  Amlodipine Besylate 5 Mg  Tabs (Amlodipine Besylate) .... One By Mouth Daqily 8)  Temazepam 30 Mg Caps (Temazepam) .Marland Kitchen.. 1 At Bedtime As Needed Sleep 9)  Vitamin D (Ergocalciferol) 50000 Unit Caps (Ergocalciferol) .... One By Mouth Weekly  Allergies: 1)  ! Cipro  Past History:  Past Medical History: Last updated: 01/19/2007 Hyperlipidemia Hypertension Prostate cancer, hx of  Past Surgical History: Last updated: 01/31/2007 Knee replacement radiation to prostate  Review of Systems       The patient complains of syncope.  The patient denies chest pain, dyspnea on exertion, and peripheral edema.    Vital Signs:  Patient profile:   77 year old male Height:      62  inches Weight:      231 pounds BMI:     42.40 Pulse rate:   63 / minute BP sitting:   130 / 72  (left arm)  Vitals Entered By: Laurance Flatten CMA (January 05, 2010 9:14 AM)  Physical Exam  General:  Well-developed,well-nourished,in no acute distress; alert,appropriate and cooperative throughout examination Head:  normocephalic and atraumatic.   Eyes:  pupils equal and pupils reactive to light.   Mouth:  Oral mucosa and oropharynx without lesions or exudates.  Teeth in good repair. Neck:  No deformities, masses, or tenderness noted. Lungs:  Normal respiratory effort, chest expands symmetrically. Lungs are clear to auscultation, no crackles or wheezes. Heart:  normal rate and regular rhythm.   Abdomen:  soft, non-tender, normal bowel sounds, no distention, and no masses.   Msk:  joint tenderness, joint swelling, and joint warmth.  cording behind the the left ankle with pain and erythema Pulses:  R and L carotid,radial,femoral,dorsalis pedis and posterior tibial pulses are full and equal bilaterally Extremities:  No clubbing, cyanosis, edema, or deformity noted with normal full range of motion of all joints.   Neurologic:  alert & oriented X3 and DTRs symmetrical and normal.     EKG  Procedure date:  01/05/2010  Findings:      Normal sinus rhythm with rate of:63.  First degree AV-Block noted.    Impression & Recommendations:  Problem # 1:  FIBRILLATION,  ATRIAL (ICD-427.31) We do not have any documentation of this.  His symptoms sound like atrial fib with pauses.  I have recommended he wear a monitor.  Problem # 2:  SYNCOPE (ICD-780.2) This is a new problem.  I supect atrial fib with post termination pauses and because he had similiar spell with out actually passing out several months ago, I have recommended an implantable loop recorder.  He will call us to schedule this.  Problem # 3:  HYPERTENSION (ICD-401.9) His blood pressure today is reasonably well controlled.  Will maintain a low  sodium diet and continue meds as below. His updated medication list for this problem includes:    Benicar Hct 40-12.5 Mg Tabs (Olmesartan medoxomil-hctz) .Marland Kitchen... Take 1 tablet by mouth once a day    Amlodipine Besylate 5 Mg Tabs (Amlodipine besylate) ..... One by mouth daqily  Other Orders: EKG w/ Interpretation (93000)  Patient Instructions: 1)  Your physician recommends that you schedule a follow-up appointment in: pt to call back to set up Lool implant

## 2010-07-23 NOTE — Progress Notes (Signed)
Summary: talk to nurse  Phone Note Call from Patient Call back at [phone]   Caller: Patient Summary of Call: pt would like to talk to nurse. states he has several questions that he would like to talk to the nurse about.  Initial call taken by: Edman Circle,  January 09, 2010 9:59 AM  Follow-up for Phone Call        8/8/11for labs at 8:30 and ILR on the 01/30/10 Pt having bad headach with sharpe pain that goes away quick [facility]  January 13, 2010 8:25 AM pt sch and labs and instructions in comp Dennis Bast, RN, BSN  January 16, 2010 4:58 PM

## 2010-07-23 NOTE — Progress Notes (Signed)
Summary: call to clarify  Phone Note From Pharmacy   Caller: ht pharmacist,[phone] Summary of Call: Triamcinolone 0.1% 1 to 4 ratio.  Need complete clarification. Believe it's a cream.  Believe it's a compound.  Will patient mix.  What is the 1 part?  What is the 4 parts? Initial call taken by: Rudy Jew, RN,  April 20, 2010 3:56 PM  Follow-up for Phone Call        one part triamcinalone to 4 parts eucerine cream pharamcy to compound Follow-up by: Stacie Glaze MD,  April 20, 2010 5:00 PM  Additional Follow-up for Phone Call Additional follow up Details #1::        Notified pharmacy, Additional Follow-up by: [facility] CMA AAMA,  April 20, 2010 5:02 PM

## 2010-07-23 NOTE — Cardiovascular Report (Signed)
Summary: Office Visit   Office Visit   Imported By: Roderic Ovens 02/19/2010 15:48:48  _____________________________________________________________________  External Attachment:    Type:   Image     Comment:   External Document

## 2010-07-23 NOTE — Assessment & Plan Note (Signed)
Summary: ?UTI/BLOOD IN URINE/DISCOMFORT/CJR   Vital Signs:  Patient profile:   77 year old male Temp:     98.4 degrees F oral BP sitting:   130 / 62  (left arm) Cuff size:   regular  Vitals Entered By: Sid Falcon LPN (November 19, 452 3:47 PM)  History of Present Illness: One week ago gross blood noted in toilet with urination.  None since then. 2 day hx L testicle discomfort, mild.  Hx prostate cancer treated with radiation. No burning with urination.  Mild freq.  No back pain.  no fever. No appetite or weight changes.  Reports remote hx of one kidney stone.  Allergies: 1)  ! Cipro  Past History:  Past Medical History: Last updated: 01/19/2007 Hyperlipidemia Hypertension Prostate cancer, hx of  Past Surgical History: Last updated: 01/31/2007 Knee replacement radiation to prostate  Family History: Last updated: 10/16/2007 Family History High cholesterol  Social History: Last updated: 01/31/2007 Retired Married Former Smoker  Risk Factors: Smoking Status: quit > 6 months (08/20/2009) PMH-FH-SH reviewed for relevance  Review of Systems  The patient denies anorexia, fever, weight loss, abdominal pain, incontinence, melena, hematochezia, genital sores, enlarged lymph nodes, and testicular masses.    Physical Exam  General:  Well-developed,well-nourished,in no acute distress; alert,appropriate and cooperative throughout examination Mouth:  Oral mucosa and oropharynx without lesions or exudates.  Teeth in good repair. Lungs:  Normal respiratory effort, chest expands symmetrically. Lungs are clear to auscultation, no crackles or wheezes. Heart:  normal rate and regular rhythm.   Abdomen:  soft, non-tender, normal bowel sounds, no distention, and no masses.   Genitalia:  implant.  testes normal.  No hernia. Skin:  no rashes.   Psych:  normally interactive, good eye contact, not anxious appearing, and not depressed appearing.     Impression &  Recommendations:  Problem # 1:  HEMATURIA UNSPECIFIED (ICD-599.70) Assessment New No pyuria.  Needs urologic assessment for further assessment of hematuria.  Will refer.  repeat PSA today. Orders: Urology Referral (Urology) Venipuncture [phone])  Complete Medication List: 1)  Benicar Hct 40-12.5 Mg Tabs (Olmesartan medoxomil-hctz) .... Take 1 tablet by mouth once a day 2)  Vytorin 10-40 Mg Tabs (Ezetimibe-simvastatin) .... Once daily 3)  Prednisone 5 Mg Tabs (Prednisone) .... Take 1 tablet by mouth once a day 4)  Requip 2 Mg Tabs (Ropinirole hcl) .Marland Kitchen.. 1 once daily 5)  Nabumetone 500 Mg Tabs (Nabumetone) .... Once daily 6)  Zegerid 40-1100 Mg Caps (Omeprazole-sodium bicarbonate) .... One by mouth q hs 7)  Amlodipine Besylate 5 Mg Tabs (Amlodipine besylate) .... One by mouth daqily 8)  Voltaren 1 % Gel (Diclofenac sodium) .... To knees prn 9)  Prednisone 5 Mg Tabs (Prednisone) .Marland Kitchen.. 1 once daily 10)  Temazepam 30 Mg Caps (Temazepam) .Marland Kitchen.. 1 at bedtime as needed sleep 11)  Vitamin D (ergocalciferol) 50000 Unit Caps (Ergocalciferol) .... One by mouth weekly 12)  Fexofenadine Hcl 180 Mg Tabs (Fexofenadine hcl) .... One by mouth daily  Other Orders: TLB-PSA (Prostate Specific Antigen) (84153-PSA)

## 2010-07-23 NOTE — Progress Notes (Signed)
Summary: requesting additional labs  Phone Note Call from Patient Call back at Home Phone [phone]   Caller: Spouse---walk in Reason for Call: Acute Illness Summary of Call: having labs on 04-13-2010. would like an order to see if he needs a shingle shot or if he ever had chicken pox. please add to labs. Dr Lovell Sheehan told pt to call [facility]. Initial call taken by: Warnell Forester,  March 17, 2010 12:52 PM  Follow-up for Phone Call        please add varicella titer to cpx labs Follow-up by: Willy Eddy, LPN,  March 17, 2010 1:09 PM  Additional Follow-up for Phone Call Additional follow up Details #1::        Called pt to notify them of Varicella Titer, and pt doesnt want a vaccine until he has a test to determine if it is needed or not. Wants to add lab to cpx.  Pls advise.   Additional Follow-up by: Lucy Antigua,  March 17, 2010 2:43 PM    Additional Follow-up for Phone Call Additional follow up Details #2::    I added Varicella titer to cpx labs as noted above. Pt has been notified.    Follow-up by: Lucy Antigua,  March 17, 2010 2:52 PM

## 2010-07-23 NOTE — Letter (Signed)
Summary: [facility]  [facility]   Imported By: Maryln Gottron 12/10/2009 13:23:04  _____________________________________________________________________  External Attachment:    Type:   Image     Comment:   External Document

## 2010-07-23 NOTE — Assessment & Plan Note (Signed)
Summary: CPX/NJR----PT RSC (BMP) // RS rsc bmp ok per wife/njr   Vital Signs:  Patient profile:   77 year old male Height:      75 inches Weight:      228 pounds BMI:     28.60 Temp:     98.2 degrees F oral Pulse rate:   64 / minute Resp:     14 per minute BP sitting:   140 / 72  (left arm)  Vitals Entered By: Willy Eddy, LPN (April 20, 2010 12:19 PM) CC: annual visit for disease management Is Patient Diabetic? No   Primary Care Provider:  Stacie Glaze MD  CC:  annual visit for disease management.  History of Present Illness: Requested CPX The pt was asked about all immunizations, health maint. services that are appropriate to their age and was given guidance on diet exercize  and weight management Follow up for cellulitis ( see Dr Cato Mulligan note doxycycline for 2 weeks) pt noted improvement follow up GERD, HTN, Lipids and has psoriais as the possible "entry point for the cellulitis" using the triamcinolone for psosisis increased hearburn with coffee * he takes protonix wants the shingles vaccine  buisin on steriods  Preventive Screening-Counseling & Management  Alcohol-Tobacco     Smoking Status: quit > 6 months  Current Problems (verified): 1)  Headache, Persistent  (ICD-784.0) 2)  Hematuria Unspecified  (ICD-599.70) 3)  Cellulitis, Leg, Left  (ICD-682.6) 4)  Phlebitis and Thrombophlebitis of Other Site  (ICD-451.89) 5)  Disuse Osteoporosis  (ICD-733.03) 6)  Hyperglycemia, Borderline  (ICD-790.29) 7)  Spondylosis, Lumbar, With Radiculopathy  (ICD-756.11) 8)  Other Specified Dermatomycoses  (ICD-111.8) 9)  Extrinsic Asthma, With Exacerbation  (ICD-493.02) 10)  Calcu Gallbladd W/oth Cholecystitis&obstruction  (ICD-574.11) 11)  Varicose Veins Lower Extremities W/inflammation  (ICD-454.1) 12)  Edema  (ICD-782.3) 13)  Preventive Health Care  (ICD-V70.0) 14)  Insomnia With Sleep Apnea Unspecified  (ICD-780.51) 15)  Gerd  (ICD-530.81) 16)  Psoriatic  Arthritis  (ICD-696.0) 17)  Fibrillation, Atrial  (ICD-427.31) 18)  Abdominal Bloating  (ICD-787.3) 19)  Hyperlipidemia  (ICD-272.4) 20)  Prostate Cancer, Hx of  (ICD-V10.46) 21)  Hypertension  (ICD-401.9) 22)  Hyperlipidemia  (ICD-272.4)  Current Medications (verified): 1)  Benicar Hct 40-12.5 Mg  Tabs (Olmesartan Medoxomil-Hctz) .... Take 1 Tablet By Mouth Once A Day 2)  Vytorin 10-40 Mg  Tabs (Ezetimibe-Simvastatin) .... Once Daily 3)  Prednisone 5 Mg  Tabs (Prednisone) .... Take 1 Tablet By Mouth Once A Day 4)  Requip 2 Mg Tabs (Ropinirole Hcl) .Marland Kitchen.. 1 Once Daily 5)  Nabumetone 500 Mg  Tabs (Nabumetone) .... Once Daily 6)  Amlodipine Besylate 5 Mg  Tabs (Amlodipine Besylate) .... One By Mouth Daqily 7)  Temazepam 30 Mg Caps (Temazepam) .Marland Kitchen.. 1 At Bedtime As Needed Sleep 8)  Vitamin D (Ergocalciferol) 50000 Unit Caps (Ergocalciferol) .... One By Mouth Weekly 9)  Pantoprazole Sodium 40 Mg Tbec (Pantoprazole Sodium) .... One By Mouth Bid 10)  Doxycycline Hyclate 100 Mg Caps (Doxycycline Hyclate) .... Take 1 Tab Twice A Day 11)  Triamcinolone Acetonide 0.1 % Lotn (Triamcinolone Acetonide) .... Mix 1to 4 Ration in Eucerin Lotion and Aplly To The Effected Areas  Allergies (verified): 1)  ! Cipro  Review of Systems       Flu Vaccine Consent Questions     Do you have a history of severe allergic reactions to this vaccine? no    Any prior history of allergic reactions to egg and/or gelatin? no  Do you have a sensitivity to the preservative Thimersol? no    Do you have a past history of Guillan-Barre Syndrome? no    Do you currently have an acute febrile illness? no    Have you ever had a severe reaction to latex? no    Vaccine information given and explained to patient? yes    Are you currently pregnant? no    Lot Number:[id number]   Exp Date:12/19/2010   Site Given  Left Deltoid IM   Physical Exam  General:  alert, well-developed, and well-nourished.   Head:  normocephalic and  atraumatic.   Eyes:  pupils equal and pupils round.   Ears:  R ear normal and L ear normal.   Nose:  no external deformity and no nasal discharge.   Neck:  No deformities, masses, or tenderness noted. Chest Wall:  no tenderness and no masses.   Breasts:  no gynecomastia and no masses.   Lungs:  normal respiratory effort and no wheezes.   Heart:  normal rate and regular rhythm.   Abdomen:  soft and non-tender.   Rectal:  deferred to GU Prostate:  deferred to GU Msk:  TKR rght  Extremities:  2+ left pedal edema and trace right pedal edema.   Neurologic:  alert & oriented X3 and gait normal.   Skin:  dry skin with moderate psoriatic rash on knee and back Cervical Nodes:  No lymphadenopathy noted Axillary Nodes:  No palpable lymphadenopathy Psych:  Cognition and judgment appear intact. Alert and cooperative with normal attention span and concentration. No apparent delusions, illusions, hallucinations   Impression & Recommendations:  Problem # 1:  CELLULITIS, LEG, LEFT (ICD-682.6) improved His updated medication list for this problem includes:    Doxycycline Hyclate 100 Mg Caps (Doxycycline hyclate) .Marland Kitchen... Take 1 tab twice a day  Elevate affected area. Warm moist compresses for 20 minutes every 2 hours while awake. Take antibiotics as directed and take acetaminophen as needed. To be seen in 48-72 hours if no improvement, sooner if worse.  Problem # 2:  HYPERTENSION (ICD-401.9)  His updated medication list for this problem includes:    Benicar Hct 40-12.5 Mg Tabs (Olmesartan medoxomil-hctz) .Marland Kitchen... Take 1 tablet by mouth once a day    Amlodipine Besylate 5 Mg Tabs (Amlodipine besylate) ..... One by mouth daqily  BP today: 140/72 Prior BP: 150/68 (01/13/2010)  Prior 10 Yr Risk Heart Disease: 27 % (10/17/2008)  Labs Reviewed: K+: 4.1 (04/13/2010) Creat: : 1.2 (04/13/2010)   Chol: 139 (04/13/2010)   HDL: 44.10 (04/13/2010)   LDL: 71 (04/13/2010)   TG: 120.0 (04/13/2010)  Problem #  3:  PSORIATIC ARTHRITIS (ICD-696.0) on the prednisone  Problem # 4:  PSORIASIS (ICD-696.1) misture of triamcinalone and eurcerin lotions to treat skin lesions  Problem # 5:  PREVENTIVE HEALTH CARE (ICD-V70.0) The pt was asked about all immunizations, health maint. services that are appropriate to their age and was given guidance on diet exercize  and weight management  Td Booster: Td (10/17/2008)   Flu Vax: Fluvax 3+ (04/20/2010)   Pneumovax: Pneumovax (Medicare) (04/20/2010) Chol: 139 (04/13/2010)   HDL: 44.10 (04/13/2010)   LDL: 71 (04/13/2010)   TG: 120.0 (04/13/2010) TSH: 1.84 (04/13/2010)   HgbA1C: 5.7 (01/10/2009)   PSA: 0.48 (04/13/2010)  Discussed using sunscreen, use of alcohol, drug use, self testicular exam, routine dental care, routine eye care, routine physical exam, seat belts, multiple vitamins, osteoporosis prevention, adequate calcium intake in diet, and recommendations for immunizations.  Discussed exercise  and checking cholesterol.  Discussed gun safety, safe sex, and contraception. Also recommend checking PSA.  Complete Medication List: 1)  Benicar Hct 40-12.5 Mg Tabs (Olmesartan medoxomil-hctz) .... Take 1 tablet by mouth once a day 2)  Vytorin 10-40 Mg Tabs (Ezetimibe-simvastatin) .... Once daily 3)  Prednisone 5 Mg Tabs (Prednisone) .... Take 1 tablet by mouth once a day 4)  Requip 2 Mg Tabs (Ropinirole hcl) .Marland Kitchen.. 1 once daily 5)  Nabumetone 500 Mg Tabs (Nabumetone) .... Once daily 6)  Amlodipine Besylate 5 Mg Tabs (Amlodipine besylate) .... One by mouth daqily 7)  Temazepam 30 Mg Caps (Temazepam) .Marland Kitchen.. 1 at bedtime as needed sleep 8)  Vitamin D (ergocalciferol) 50000 Unit Caps (Ergocalciferol) .... One by mouth weekly 9)  Pantoprazole Sodium 40 Mg Tbec (Pantoprazole sodium) .... One by mouth bid 10)  Doxycycline Hyclate 100 Mg Caps (Doxycycline hyclate) .... Take 1 tab twice a day 11)  Triamcinolone Acetonide 0.1 % Lotn (Triamcinolone acetonide) .... Mix 1to 4 ration  in eucerin lotion and aplly to the effected areas  Other Orders: Flu Vaccine 22yrs + MEDICARE PATIENTS (A2130) Administration Flu vaccine - MCR (G0008) Pneumococcal Vaccine (86578) Admin 1st Vaccine (46962) TLB-Cholesterol, HDL (83718-HDL) TLB-IBC Pnl (Iron/FE;Transferrin) (83550-IBC) Venipuncture (95284)  Patient Instructions: 1)  using vitamin c  and coezyme q 10 skin creams  for bruising 2)  Please schedule a follow-up appointment in 3 months. 3)  do not loose more than 15 pound for ideal body mass Prescriptions: TRIAMCINOLONE ACETONIDE 0.1 % LOTN (TRIAMCINOLONE ACETONIDE) Mix 1to 4 ration in eucerin lotion and aplly to the effected areas  #60 gm x 11   Entered and Authorized by:   Stacie Glaze MD   Signed by:   Stacie Glaze MD on 04/20/2010   Method used:   Electronically to        [facility] [address] (retail)       [address]       [place], [place]  [address]       Ph: [phone] or [phone]       Fax: [phone]   RxID:   [id number]    Orders Added: 1)  Flu Vaccine 75yrs + MEDICARE PATIENTS [Q2039] 2)  Administration Flu vaccine - MCR [G0008] 3)  Pneumococcal Vaccine [90732] 4)  Admin 1st Vaccine [90471] 5)  TLB-Cholesterol, HDL [83718-HDL] 6)  TLB-IBC Pnl (Iron/FE;Transferrin) [83550-IBC] 7)  Venipuncture [36415] 8)  Est. Patient 65& > [99397] 9)  Est. Patient Level III [01093]   Immunizations Administered:  Pneumonia Vaccine:    Vaccine Type: Pneumovax (Medicare)    Site: right deltoid    Mfr: Merck    Dose: 0.5 ml    Route: IM    Given by: Willy Eddy, LPN    Exp. Date: 10/15/2011    Lot #: [id number]    VIS given: 05/26/09 version given April 20, 2010.   Immunizations Administered:  Pneumonia Vaccine:    Vaccine Type: Pneumovax (Medicare)    Site: right deltoid    Mfr: Merck    Dose: 0.5 ml    Route: IM    Given by: Willy Eddy, LPN    Exp. Date: 10/15/2011    Lot #:  [id number]    VIS given: 05/26/09 version given April 20, 2010.

## 2010-07-23 NOTE — Letter (Signed)
Summary: [facility]  [facility]   Imported By: Maryln Gottron 02/19/2010 09:28:03  _____________________________________________________________________  External Attachment:    Type:   Image     Comment:   External Document

## 2010-07-23 NOTE — Progress Notes (Signed)
Summary: question on device  Phone Note Call from Patient Call back at [phone]   Caller: Patient Reason for Call: Talk to Nurse Summary of Call: pt has a question re pacemaker. pt states does not know if its working correctly Initial call taken by: Roe Coombs,  June 10, 2010 4:44 PM  Follow-up for Phone Call        left message for patient.   Follow-up by: Altha Harm, LPN,  June 11, 2010 4:40 PM  Additional Follow-up for Phone Call Additional follow up Details #1::        Patient to come in later today for check. Additional Follow-up by: Altha Harm, LPN,  June 12, 2010 8:14 AM

## 2010-07-23 NOTE — Procedures (Signed)
Summary: Colonoscopy Report/Dr. Sharrell Ku  Colonoscopy Report/Dr. Sharrell Ku   Imported By: Maryln Gottron 03/11/2010 12:11:54  _____________________________________________________________________  External Attachment:    Type:   Image     Comment:   External Document

## 2010-07-23 NOTE — Letter (Signed)
Summary: [facility]  [facility]   Imported By: Maryln Gottron 07/01/2009 10:53:48  _____________________________________________________________________  External Attachment:    Type:   Image     Comment:   External Document

## 2010-07-23 NOTE — Assessment & Plan Note (Signed)
Summary: 3 MONTH FOLLOW UP/CJR/PT RSC/CJR   Vital Signs:  Patient profile:   77 year old male Height:      62 inches Weight:      234 pounds BMI:     42.95 Temp:     98.2 degrees F oral Pulse rate:   76 / minute Resp:     14 per minute BP sitting:   140 / 80  (left arm)  Vitals Entered By: Willy Eddy, LPN (August 20, 1608 10:41 AM) CC: roa--cellulits in left leg has healed   CC:  roa--cellulits in left leg has healed.  History of Present Illness: back pain and HTN folow up increased allergies and PNDrip zytec has failed at this point  Follow-Up Visit      This is a 77 year old man who presents for Follow-up visit.  The patient denies chest pain, palpitations, dizziness, syncope, low blood sugar symptoms, high blood sugar symptoms, edema, SOB, DOE, PND, and orthopnea.  Since the last visit the patient notes no new problems or concerns.  The patient reports monitoring BP.  When questioned about possible medication side effects, the patient notes none and cramping.    Preventive Screening-Counseling & Management  Alcohol-Tobacco     Smoking Status: quit > 6 months  Problems Prior to Update: 1)  Cellulitis, Leg, Left  (ICD-682.6) 2)  Phlebitis and Thrombophlebitis of Other Site  (ICD-451.89) 3)  Disuse Osteoporosis  (ICD-733.03) 4)  Hyperglycemia, Borderline  (ICD-790.29) 5)  Leukocytosis Unspecified  (ICD-288.60) 6)  Spondylosis, Lumbar, With Radiculopathy  (ICD-756.11) 7)  Hip Pain, Left  (ICD-719.45) 8)  Other Specified Dermatomycoses  (ICD-111.8) 9)  Extrinsic Asthma, With Exacerbation  (ICD-493.02) 10)  Calcu Gallbladd W/oth Cholecystitis&obstruction  (ICD-574.11) 11)  Varicose Veins Lower Extremities W/inflammation  (ICD-454.1) 12)  Edema  (ICD-782.3) 13)  Preventive Health Care  (ICD-V70.0) 14)  Arthritis, Hand  (ICD-716.94) 15)  Insomnia With Sleep Apnea Unspecified  (ICD-780.51) 16)  Gerd  (ICD-530.81) 17)  Psoriatic Arthritis  (ICD-696.0) 18)   Fibrillation, Atrial  (ICD-427.31) 19)  Abdominal Bloating  (ICD-787.3) 20)  Advef, Drug/medicinal/biological Subst Nos  (ICD-995.20) 21)  Hyperlipidemia  (ICD-272.4) 22)  Prostate Cancer, Hx of  (ICD-V10.46) 23)  Hypertension  (ICD-401.9) 24)  Hyperlipidemia  (ICD-272.4)  Current Problems (verified): 1)  Cellulitis, Leg, Left  (ICD-682.6) 2)  Phlebitis and Thrombophlebitis of Other Site  (ICD-451.89) 3)  Disuse Osteoporosis  (ICD-733.03) 4)  Hyperglycemia, Borderline  (ICD-790.29) 5)  Leukocytosis Unspecified  (ICD-288.60) 6)  Spondylosis, Lumbar, With Radiculopathy  (ICD-756.11) 7)  Hip Pain, Left  (ICD-719.45) 8)  Other Specified Dermatomycoses  (ICD-111.8) 9)  Extrinsic Asthma, With Exacerbation  (ICD-493.02) 10)  Calcu Gallbladd W/oth Cholecystitis&obstruction  (ICD-574.11) 11)  Varicose Veins Lower Extremities W/inflammation  (ICD-454.1) 12)  Edema  (ICD-782.3) 13)  Preventive Health Care  (ICD-V70.0) 14)  Arthritis, Hand  (ICD-716.94) 15)  Insomnia With Sleep Apnea Unspecified  (ICD-780.51) 16)  Gerd  (ICD-530.81) 17)  Psoriatic Arthritis  (ICD-696.0) 18)  Fibrillation, Atrial  (ICD-427.31) 19)  Abdominal Bloating  (ICD-787.3) 20)  Advef, Drug/medicinal/biological Subst Nos  (ICD-995.20) 21)  Hyperlipidemia  (ICD-272.4) 22)  Prostate Cancer, Hx of  (ICD-V10.46) 23)  Hypertension  (ICD-401.9) 24)  Hyperlipidemia  (ICD-272.4)  Medications Prior to Update: 1)  Benicar Hct 40-12.5 Mg  Tabs (Olmesartan Medoxomil-Hctz) .... Take 1 Tablet By Mouth Once A Day 2)  Vytorin 10-40 Mg  Tabs (Ezetimibe-Simvastatin) .... Once Daily 3)  Prednisone 5 Mg  Tabs (  Prednisone) .... Take 1 Tablet By Mouth Once A Day 4)  Requip 2 Mg Tabs (Ropinirole Hcl) .Marland Kitchen.. 1 Once Daily 5)  Zyrtec Allergy 10 Mg  Tabs (Cetirizine Hcl) .... As Needed 6)  Nabumetone 500 Mg  Tabs (Nabumetone) .... Once Daily 7)  Zegerid 40-1100 Mg  Caps (Omeprazole-Sodium Bicarbonate) .... One By Mouth Q Hs 8)  Amlodipine  Besylate 5 Mg  Tabs (Amlodipine Besylate) .... One By Mouth Daqily 9)  Hydrocodone-Acetaminophen 5-325 Mg  Tabs (Hydrocodone-Acetaminophen) .... One By Mouth Q Hs 10)  Voltaren 1 %  Gel (Diclofenac Sodium) .... To Knees Prn 11)  Lotrisone 1-0.05 % Crea (Clotrimazole-Betamethasone) .... Apply To Affected Area Twice A Day For 7 To 14 Days 12)  Prednisone 5 Mg Tabs (Prednisone) .Marland Kitchen.. 1 Once Daily 13)  Skelaxin 800 Mg Tabs (Metaxalone) .... One Half To One By Mouth Every 6 Hours ( One Whole At Night) 14)  Percocet 10-650 Mg Tabs (Oxycodone-Acetaminophen) .... One By Mouth Q 6 15)  Temazepam 30 Mg Caps (Temazepam) .Marland Kitchen.. 1 At Bedtime As Needed Sleep 16)  Vitamin D (Ergocalciferol) 50000 Unit Caps (Ergocalciferol) .... One By Mouth Weekly 17)  Cephalexin 500 Mg Caps (Cephalexin) .... One By Mouth Three Times A Day For 7 Days  Current Medications (verified): 1)  Benicar Hct 40-12.5 Mg  Tabs (Olmesartan Medoxomil-Hctz) .... Take 1 Tablet By Mouth Once A Day 2)  Vytorin 10-40 Mg  Tabs (Ezetimibe-Simvastatin) .... Once Daily 3)  Prednisone 5 Mg  Tabs (Prednisone) .... Take 1 Tablet By Mouth Once A Day 4)  Requip 2 Mg Tabs (Ropinirole Hcl) .Marland Kitchen.. 1 Once Daily 5)  Nabumetone 500 Mg  Tabs (Nabumetone) .... Once Daily 6)  Zegerid 40-1100 Mg  Caps (Omeprazole-Sodium Bicarbonate) .... One By Mouth Q Hs 7)  Amlodipine Besylate 5 Mg  Tabs (Amlodipine Besylate) .... One By Mouth Daqily 8)  Voltaren 1 %  Gel (Diclofenac Sodium) .... To Knees Prn 9)  Prednisone 5 Mg Tabs (Prednisone) .Marland Kitchen.. 1 Once Daily 10)  Temazepam 30 Mg Caps (Temazepam) .Marland Kitchen.. 1 At Bedtime As Needed Sleep 11)  Vitamin D (Ergocalciferol) 50000 Unit Caps (Ergocalciferol) .... One By Mouth Weekly  Allergies (verified): 1)  ! Cipro  Past History:  Family History: Last updated: 10/16/2007 Family History High cholesterol  Social History: Last updated: 01/31/2007 Retired Married Former Smoker  Risk Factors: Smoking Status: quit > 6 months  (08/20/2009)  Past medical, surgical, family and social histories (including risk factors) reviewed, and no changes noted (except as noted below).  Past Medical History: Reviewed history from 01/19/2007 and no changes required. Hyperlipidemia Hypertension Prostate cancer, hx of  Past Surgical History: Reviewed history from 01/31/2007 and no changes required. Knee replacement radiation to prostate  Family History: Reviewed history from 10/16/2007 and no changes required. Family History High cholesterol  Social History: Reviewed history from 01/31/2007 and no changes required. Retired Married Former Smoker  Review of Systems  The patient denies anorexia, fever, weight loss, weight gain, vision loss, decreased hearing, hoarseness, chest pain, syncope, dyspnea on exertion, peripheral edema, prolonged cough, headaches, hemoptysis, abdominal pain, melena, hematochezia, severe indigestion/heartburn, hematuria, incontinence, genital sores, muscle weakness, suspicious skin lesions, transient blindness, difficulty walking, depression, unusual weight change, abnormal bleeding, enlarged lymph nodes, angioedema, and breast masses.    Physical Exam  General:  anxious appearing.   Head:  normocephalic and atraumatic.   Eyes:  pupils equal and pupils reactive to light.   Ears:  R ear normal, L ear normal, and  no external deformities.   Nose:  no external erythema.   Mouth:  Oral mucosa and oropharynx without lesions or exudates.  Teeth in good repair. Neck:  No deformities, masses, or tenderness noted. Lungs:  normal respiratory effort.   increased bronchjial breath sounds, no wheezing Heart:  Normal rate and regular rhythm. S1 and S2 normal without gallop. Abdomen:  soft, non-tender, and normal bowel sounds.   Msk:  joint tenderness, joint swelling, and joint warmth.  cording behind the the left ankle with pain and erythema Extremities:  No clubbing, cyanosis, edema, or deformity noted  with normal full range of motion of all joints.   Neurologic:  alert & oriented X3 and DTRs symmetrical and normal.     Impression & Recommendations:  Problem # 1:  HYPERTENSION (ICD-401.9)  His updated medication list for this problem includes:    Benicar Hct 40-12.5 Mg Tabs (Olmesartan medoxomil-hctz) .Marland Kitchen... Take 1 tablet by mouth once a day    Amlodipine Besylate 5 Mg Tabs (Amlodipine besylate) ..... One by mouth daqily  BP today: 140/80 Prior BP: 150/70 (07/02/2009)  Prior 10 Yr Risk Heart Disease: 27 % (10/17/2008)  Labs Reviewed: K+: 4.5 (01/10/2009) Creat: : 1.1 (01/10/2009)   Chol: 198 (05/07/2009)   HDL: 47.90 (05/07/2009)   LDL: 123 (05/07/2009)   TG: 134.0 (05/07/2009)  Problem # 2:  EXTRINSIC ASTHMA, WITH EXACERBATION (ICD-493.02) Assessment: Unchanged  His updated medication list for this problem includes:    Prednisone 5 Mg Tabs (Prednisone) .Marland Kitchen... Take 1 tablet by mouth once a day    Prednisone 5 Mg Tabs (Prednisone) .Marland Kitchen... 1 once daily  Problem # 3:  FIBRILLATION, ATRIAL (ICD-427.31) stable His updated medication list for this problem includes:    Amlodipine Besylate 5 Mg Tabs (Amlodipine besylate) ..... One by mouth daqily  Problem # 4:  SPONDYLOSIS, LUMBAR, WITH RADICULOPATHY (ICD-756.11) back is 95 % and has minor pain since surgery  Nov 29  fused 4-5  Problem # 5:  GERD (ICD-530.81)  His updated medication list for this problem includes:    Zegerid 40-1100 Mg Caps (Omeprazole-sodium bicarbonate) ..... One by mouth q hs  Labs Reviewed: Hgb: 13.5 (01/10/2009)   Hct: 40.1 (01/10/2009)  Complete Medication List: 1)  Benicar Hct 40-12.5 Mg Tabs (Olmesartan medoxomil-hctz) .... Take 1 tablet by mouth once a day 2)  Vytorin 10-40 Mg Tabs (Ezetimibe-simvastatin) .... Once daily 3)  Prednisone 5 Mg Tabs (Prednisone) .... Take 1 tablet by mouth once a day 4)  Requip 2 Mg Tabs (Ropinirole hcl) .Marland Kitchen.. 1 once daily 5)  Nabumetone 500 Mg Tabs (Nabumetone) .... Once  daily 6)  Zegerid 40-1100 Mg Caps (Omeprazole-sodium bicarbonate) .... One by mouth q hs 7)  Amlodipine Besylate 5 Mg Tabs (Amlodipine besylate) .... One by mouth daqily 8)  Voltaren 1 % Gel (Diclofenac sodium) .... To knees prn 9)  Prednisone 5 Mg Tabs (Prednisone) .Marland Kitchen.. 1 once daily 10)  Temazepam 30 Mg Caps (Temazepam) .Marland Kitchen.. 1 at bedtime as needed sleep 11)  Vitamin D (ergocalciferol) 50000 Unit Caps (Ergocalciferol) .... One by mouth weekly 12)  Fexofenadine Hcl 180 Mg Tabs (Fexofenadine hcl) .... One by mouth daily  Patient Instructions: 1)  Please schedule a follow-up appointment in 6 months.  CPX Prescriptions: FEXOFENADINE HCL 180 MG TABS (FEXOFENADINE HCL) one by mouth daily  #30 x 11   Entered and Authorized by:   Stacie Glaze MD   Signed by:   Stacie Glaze MD on 08/20/2009   Method  used:   Electronically to        [facility] (retail)       [address]       [place], [place]  [address]       Ph: [phone] or [phone]       Fax: [phone]   RxID:   [id number]

## 2010-07-23 NOTE — Progress Notes (Signed)
Summary: test req  Phone Note Call from Patient Call back at Home Phone [phone] Call back at Work Phone [phone] Call back at [phone] or c [phone]   Caller: vm wife janet Reason for Call: Insurance Question Summary of Call: Episode last Thurs while out of town clammy, lightheaded, dizzy, heart erratic, passed out.  Dr. Shela Commons had him see card Mon am.  Suspecting episode of a fib, tests, monitor, possible pacemaker.  Wants to have low BS test before he makes any decision.   Initial call taken by: Rudy Jew, RN,  January 06, 2010 4:10 PM  Follow-up for Phone Call        scheduled 3 hr glucose tolerence test sch3eduled for am and pt informed Follow-up by: Willy Eddy, LPN,  January 06, 2010 5:10 PM

## 2010-07-23 NOTE — Miscellaneous (Signed)
Summary: Device preload  Clinical Lists Changes  Observations: Added new observation of ILR SERIAL: [id number] (02/06/2010 12:51) Added new observation of ILR MODEL: 9529  (02/06/2010 12:51) Added new observation of ILR VENDOR: Medtronic  (02/06/2010 12:51) Added new observation of ILR DTOFINS: 01/30/2010  (02/06/2010 12:51) Added new observation of ILR MD: Lewayne Bunting, MD  (02/06/2010 12:51)       ILR Following MD Lewayne Bunting, MD DOI:  01/30/2010 Vendor:  Medtronic     Model Number:  9529     Serial Number [id number]

## 2010-07-23 NOTE — Progress Notes (Signed)
Summary: pt has questions about procedure  Phone Note Call from Patient Call back at [phone]   Caller: Patient Reason for Call: Talk to Nurse, Talk to Doctor Summary of Call: pt has questions about procedure for tomorrow Initial call taken by: Omer Jack,  June 03, 2010 10:38 AM  Follow-up for Phone Call        adv pt no problems w/hot tubs. can work out after one week and will not be on plavix or coumadin unless indicated per Dr. Ladona Ridgel. Pt expressed understanding. Follow-up by: Claris Gladden RN,  June 03, 2010 11:04 AM

## 2010-07-23 NOTE — Cardiovascular Report (Signed)
Summary: Office Visit   Office Visit   Imported By: Roderic Ovens 06/10/2010 09:26:26  _____________________________________________________________________  External Attachment:    Type:   Image     Comment:   External Document

## 2010-07-23 NOTE — Cardiovascular Report (Signed)
Summary: Pre-Op Orders  Pre-Op Orders   Imported By: Marylou Mccoy 06/09/2010 09:15:56  _____________________________________________________________________  External Attachment:    Type:   Image     Comment:   External Document

## 2010-07-23 NOTE — Progress Notes (Signed)
Summary: report on cellulitis  Phone Note Call from Patient Call back at [phone]   Call For: Yennifer Segovia Reason for Call: Acute Illness Summary of Call: Cellulitis area is swollen & red to top of sock line.  Not worse or better.  On antibiotic. Keeping leg elevated as much as he can.  Painful to touch.  No sore as such.  Not up leg any more.  No streaks.   Initial call taken by: Rudy Jew, RN,  July 04, 2009 8:42 AM  Follow-up for Phone Call        talked with pt and will call if not alot better by monday- pt instructed to go to er if condition gets worse Follow-up by: Willy Eddy, LPN,  July 04, 2009 2:14 PM

## 2010-07-28 ENCOUNTER — Other Ambulatory Visit: Payer: Self-pay | Admitting: Internal Medicine

## 2010-07-29 NOTE — Assessment & Plan Note (Signed)
Summary: 3 MONTH FUP//CCM   Vital Signs:  Patient profile:   77 year old male Height:      75 inches Weight:      230 pounds BMI:     28.85 Temp:     98.2 degrees F oral Pulse rate:   68 / minute Resp:     14 per minute BP sitting:   130 / 80  (left arm)  Vitals Entered By: Willy Eddy, LPN (July 20, 2010 12:22 PM) CC: roa- had pacemaker in [facility]-states they changed benicar and he hasnt been taking- called ph armacy and the only bp med he is on is amlodipine--, Hypertension Management Is Patient Diabetic? No   Primary Care Provider:  Stacie Glaze MD  CC:  roa- had pacemaker in [facility]-states they changed benicar and he hasnt been taking- called ph armacy and the only bp med he is on is amlodipine-- and Hypertension Management.  History of Present Illness: The pt stopped the benicar  in  Dec but we have not record of why it was stoped and it has continued to be lisited at his pacer visits? there seems to be considered some confusion about his medications , he has discontinued her Benicar but it has not been replaced by any medication nor has the amlodipine but increased we do see that the Vytorin was discontinued because of the simvastatin and amlodipine in her action and he was placed on Lipitor and Zetia 2 replace this combination drug.   he had a pacemaker placed in December for atrial fibrillation.  Hypertension History:      He denies headache, chest pain, palpitations, dyspnea with exertion, orthopnea, PND, peripheral edema, visual symptoms, neurologic problems, syncope, and side effects from treatment.        Positive major cardiovascular risk factors include male age 85 years old or older, hyperlipidemia, and hypertension.  Negative major cardiovascular risk factors include non-tobacco-user status.     Preventive Screening-Counseling & Management  Alcohol-Tobacco     Smoking Status: quit > 6 months  Problems Prior to Update: 1)  Syncope  (ICD-780.2) 2)   Iron Defic Anemia Sec Diet Iron Intake  (ICD-280.1) 3)  Psoriasis  (ICD-696.1) 4)  Headache, Persistent  (ICD-784.0) 5)  Hematuria Unspecified  (ICD-599.70) 6)  Cellulitis, Leg, Left  (ICD-682.6) 7)  Phlebitis and Thrombophlebitis of Other Site  (ICD-451.89) 8)  Disuse Osteoporosis  (ICD-733.03) 9)  Hyperglycemia, Borderline  (ICD-790.29) 10)  Spondylosis, Lumbar, With Radiculopathy  (ICD-756.11) 11)  Other Specified Dermatomycoses  (ICD-111.8) 12)  Extrinsic Asthma, With Exacerbation  (ICD-493.02) 13)  Calcu Gallbladd W/oth Cholecystitis&obstruction  (ICD-574.11) 14)  Varicose Veins Lower Extremities W/inflammation  (ICD-454.1) 15)  Edema  (ICD-782.3) 16)  Preventive Health Care  (ICD-V70.0) 17)  Insomnia With Sleep Apnea Unspecified  (ICD-780.51) 18)  Gerd  (ICD-530.81) 19)  Psoriatic Arthritis  (ICD-696.0) 20)  Fibrillation, Atrial  (ICD-427.31) 21)  Abdominal Bloating  (ICD-787.3) 22)  Hyperlipidemia  (ICD-272.4) 23)  Prostate Cancer, Hx of  (ICD-V10.46) 24)  Hypertension  (ICD-401.9) 25)  Hyperlipidemia  (ICD-272.4)  Current Problems (verified): 1)  Syncope  (ICD-780.2) 2)  Iron Defic Anemia Sec Diet Iron Intake  (ICD-280.1) 3)  Psoriasis  (ICD-696.1) 4)  Headache, Persistent  (ICD-784.0) 5)  Hematuria Unspecified  (ICD-599.70) 6)  Cellulitis, Leg, Left  (ICD-682.6) 7)  Phlebitis and Thrombophlebitis of Other Site  (ICD-451.89) 8)  Disuse Osteoporosis  (ICD-733.03) 9)  Hyperglycemia, Borderline  (ICD-790.29) 10)  Spondylosis, Lumbar, With  Radiculopathy  (ICD-756.11) 11)  Other Specified Dermatomycoses  (ICD-111.8) 12)  Extrinsic Asthma, With Exacerbation  (ICD-493.02) 13)  Calcu Gallbladd W/oth Cholecystitis&obstruction  (ICD-574.11) 14)  Varicose Veins Lower Extremities W/inflammation  (ICD-454.1) 15)  Edema  (ICD-782.3) 16)  Preventive Health Care  (ICD-V70.0) 17)  Insomnia With Sleep Apnea Unspecified  (ICD-780.51) 18)  Gerd  (ICD-530.81) 19)  Psoriatic Arthritis   (ICD-696.0) 20)  Fibrillation, Atrial  (ICD-427.31) 21)  Abdominal Bloating  (ICD-787.3) 22)  Hyperlipidemia  (ICD-272.4) 23)  Prostate Cancer, Hx of  (ICD-V10.46) 24)  Hypertension  (ICD-401.9) 25)  Hyperlipidemia  (ICD-272.4)  Medications Prior to Update: 1)  Benicar Hct 40-12.5 Mg  Tabs (Olmesartan Medoxomil-Hctz) .... Take 1 Tablet By Mouth Once A Day 2)  Prednisone 5 Mg  Tabs (Prednisone) .... Take 1 Tablet By Mouth Once A Day 3)  Requip 2 Mg Tabs (Ropinirole Hcl) .Marland Kitchen.. 1 Once Daily 4)  Nabumetone 500 Mg  Tabs (Nabumetone) .... Two Times A Day 5)  Amlodipine Besylate 5 Mg  Tabs (Amlodipine Besylate) .... One By Mouth Daqily 6)  Temazepam 30 Mg Caps (Temazepam) .Marland Kitchen.. 1 At Bedtime As Needed Sleep 7)  Vitamin D (Ergocalciferol) 50000 Unit Caps (Ergocalciferol) .... One By Mouth Weekly 8)  Pantoprazole Sodium 40 Mg Tbec (Pantoprazole Sodium) .... One By Mouth Bid 9)  Triamcinolone Acetonide 0.1 % Lotn (Triamcinolone Acetonide) .... Mix 1to 4 Ration in Eucerin Lotion and Aplly To The Effected Areas 10)  Nu-Iron 150 Mg Caps (Polysaccharide Iron Complex) .Marland Kitchen.. 1 Once Daily For 2 Months 11)  Tac/eucerin Compound .... Use As Directed  Current Medications (verified): 1)  Prednisone 5 Mg  Tabs (Prednisone) .... Take 1 Tablet By Mouth Once A Day 2)  Requip 2 Mg Tabs (Ropinirole Hcl) .Marland Kitchen.. 1 Once Daily 3)  Nabumetone 500 Mg  Tabs (Nabumetone) .... Two Times A Day 4)  Amlodipine Besylate 5 Mg  Tabs (Amlodipine Besylate) .... One By Mouth Daqily 5)  Temazepam 30 Mg Caps (Temazepam) .Marland Kitchen.. 1 At Bedtime As Needed Sleep 6)  Vitamin D (Ergocalciferol) 50000 Unit Caps (Ergocalciferol) .... One By Mouth Weekly 7)  Pantoprazole Sodium 40 Mg Tbec (Pantoprazole Sodium) .... One By Mouth Bid 8)  Triamcinolone Acetonide 0.1 % Lotn (Triamcinolone Acetonide) .... Mix 1to 4 Ration in Eucerin Lotion and Aplly To The Effected Areas 9)  Nu-Iron 150 Mg Caps (Polysaccharide Iron Complex) .Marland Kitchen.. 1 Once Daily For 2  Months 10)  Tac/eucerin Compound .... Use As Directed 11)  Lipitor 20 Mg Tabs (Atorvastatin Calcium) .Marland Kitchen.. 1 Once Daily 12)  Zetia 10 Mg Tabs (Ezetimibe) .Marland Kitchen.. 1 Once Daily  Allergies (verified): 1)  ! Cipro  Past History:  Family History: Last updated: 10/16/2007 Family History High cholesterol  Social History: Last updated: 01/31/2007 Retired Married Former Smoker  Risk Factors: Smoking Status: quit > 6 months (07/20/2010)  Past medical, surgical, family and social histories (including risk factors) reviewed, and no changes noted (except as noted below).  Past Medical History: Reviewed history from 01/19/2007 and no changes required. Hyperlipidemia Hypertension Prostate cancer, hx of  Past Surgical History: Reviewed history from 01/31/2007 and no changes required. Knee replacement radiation to prostate  Family History: Reviewed history from 10/16/2007 and no changes required. Family History High cholesterol  Social History: Reviewed history from 01/31/2007 and no changes required. Retired Married Former Smoker  Review of Systems  The patient denies anorexia, fever, weight loss, weight gain, vision loss, decreased hearing, hoarseness, chest pain, syncope, dyspnea on exertion, peripheral edema, prolonged  cough, headaches, hemoptysis, abdominal pain, melena, hematochezia, severe indigestion/heartburn, hematuria, incontinence, genital sores, muscle weakness, suspicious skin lesions, transient blindness, difficulty walking, depression, unusual weight change, abnormal bleeding, enlarged lymph nodes, angioedema, and breast masses.    Physical Exam  General:  alert, well-developed, and well-nourished.   Head:  normocephalic and atraumatic.   Eyes:  pupils equal and pupils round.   Ears:  R ear normal and L ear normal.   Nose:  no external deformity and no nasal discharge.   Mouth:  Oral mucosa and oropharynx without lesions or exudates.  Teeth in good repair. Neck:   No deformities, masses, or tenderness noted. Lungs:  normal respiratory effort and no wheezes.   Heart:  irregular rhythm.   Abdomen:  soft and non-tender.   Msk:  TKR rght  Pulses:  R and L carotid,radial,femoral,dorsalis pedis and posterior tibial pulses are full and equal bilaterally Extremities:  2+ left pedal edema and trace right pedal edema.   Neurologic:  alert & oriented X3 and gait normal.     Impression & Recommendations:  Problem # 1:  FIBRILLATION, ATRIAL (ICD-427.31)  His updated medication list for this problem includes:    Amlodipine Besylate 5 Mg Tabs (Amlodipine besylate) ..... One by mouth daqily  Reviewed the following: PT: 10.3 (06/01/2010)   INR: 1.0 ratio (06/01/2010)  Problem # 2:  SYNCOPE (ICD-780.2) due to the AF  Problem # 3:  HYPERTENSION (ICD-401.9)  The following medications were removed from the medication list:    Benicar Hct 40-12.5 Mg Tabs (Olmesartan medoxomil-hctz) .Marland Kitchen... Take 1 tablet by mouth once a day His updated medication list for this problem includes:    Amlodipine Besylate 5 Mg Tabs (Amlodipine besylate) ..... One by mouth daqily  BP today: 130/80 Prior BP: 131/56 (06/01/2010)  10 Yr Risk Heart Disease: 14 % Prior 10 Yr Risk Heart Disease: 27 % (10/17/2008)  Labs Reviewed: K+: 5.0 (06/01/2010) Creat: : 1.2 (06/01/2010)   Chol: 139 (04/13/2010)   HDL: 44.10 (04/13/2010)   LDL: 71 (04/13/2010)   TG: 120.0 (04/13/2010)  Orders: TLB-BMP (Basic Metabolic Panel-BMET) (80048-METABOL) Venipuncture (46962)  Problem # 4:  UNSPECIFIED VISUAL DISTURBANCE (ICD-368.9)  needs upper lids reduced due to visual deficit he has a 30% upper filed cut due to excessive eye lids Orders: Surgical Referral (Surgery)  Complete Medication List: 1)  Prednisone 5 Mg Tabs (Prednisone) .... Take 1 tablet by mouth once a day 2)  Requip 2 Mg Tabs (Ropinirole hcl) .Marland Kitchen.. 1 once daily 3)  Nabumetone 500 Mg Tabs (Nabumetone) .... Two times a day 4)   Amlodipine Besylate 5 Mg Tabs (Amlodipine besylate) .... One by mouth daqily 5)  Temazepam 30 Mg Caps (Temazepam) .Marland Kitchen.. 1 at bedtime as needed sleep 6)  Vitamin D (ergocalciferol) 50000 Unit Caps (Ergocalciferol) .... One by mouth weekly 7)  Pantoprazole Sodium 40 Mg Tbec (Pantoprazole sodium) .... One by mouth bid 8)  Triamcinolone Acetonide 0.1 % Lotn (Triamcinolone acetonide) .... Mix 1to 4 ration in eucerin lotion and aplly to the effected areas 9)  Nu-iron 150 Mg Caps (Polysaccharide iron complex) .Marland Kitchen.. 1 once daily for 2 months 10)  Tac/eucerin Compound  .... Use as directed 11)  Lipitor 20 Mg Tabs (Atorvastatin calcium) .Marland Kitchen.. 1 once daily 12)  Zetia 10 Mg Tabs (Ezetimibe) .Marland Kitchen.. 1 once daily  Other Orders: TLB-IBC Pnl (Iron/FE;Transferrin) (83550-IBC) TLB-CBC Platelet - w/Differential (85025-CBCD) TLB-Cholesterol, HDL (83718-HDL) TLB-Cholesterol, Direct LDL (83721-DIRLDL) TLB-Cholesterol, Total (82465-CHO)  Hypertension Assessment/Plan:      The  patient's hypertensive risk group is category B: At least one risk factor (excluding diabetes) with no target organ damage.  His calculated 10 year risk of coronary heart disease is 14 %.  Today's blood pressure is 130/80.  His blood pressure goal is < 140/90.  Patient Instructions: 1)  Please schedule a follow-up appointment in 4 months.   Orders Added: 1)  TLB-IBC Pnl (Iron/FE;Transferrin) [83550-IBC] 2)  TLB-CBC Platelet - w/Differential [85025-CBCD] 3)  TLB-Cholesterol, HDL [83718-HDL] 4)  TLB-Cholesterol, Direct LDL [83721-DIRLDL] 5)  TLB-Cholesterol, Total [82465-CHO] 6)  TLB-BMP (Basic Metabolic Panel-BMET) [80048-METABOL] 7)  Venipuncture [36415] 8)  Est. Patient Level IV [02725] 9)  Surgical Referral [Surgery]  Appended Document: Orders Update    Clinical Lists Changes  Orders: Added new Service order of Specimen Handling ([id number]) - Signed

## 2010-07-30 NOTE — Op Note (Signed)
NAMELONDELL, NOLL               ACCOUNT NO.:  [id number]  MEDICAL RECORD NO.:  [id number]          PATIENT TYPE:  OIB  LOCATION:  6525                         FACILITY:  [facility]  PHYSICIAN:  Doylene Canning. Ladona Ridgel, MD    DATE OF BIRTH:  01-07-1934  DATE OF PROCEDURE:  06/04/2010 DATE OF DISCHARGE:                              OPERATIVE REPORT   PROCEDURE PERFORMED:  Insertion of a dual-chamber pacemaker followed by removal of an implantable loop recorder.  INTRODUCTION:  The patient is a 77 year old male with a remote history of syncope secondary to SVT.  He underwent catheter ablation years ago. The patient has had recurrent episodes of syncope and was found to be bradycardic.  This was demonstrated after insertion of an implantable loop recorder.  He is now referred for insertion of a dual-chamber pacemaker secondary to syncope, secondary to bradycardia with documented pauses of 4 seconds.  PROCEDURE:  After informed consent was obtained, the patient was taken to the diagnostic EP lab in the fasting state.  After usual preparation and draping, intravenous fentanyl and midazolam was given for sedation. Lidocaine 30 mL was infiltrated into the left infraclavicular region.  A 5-cm incision was carried out over this region.  Electrocautery was utilized to dissect down to the fascial plane.  The left subclavian vein was then punctured x2 and the Medtronic model 4076 58-cm active fixation pacing lead, serial number [id number], was advanced to the right ventricle and the Medtronic model 4076 52-cm active fixation pacing lead, serial number [id number], was advanced to the right atrium. Mapping was carried out in the right ventricle at the final spot.  The R- waves measured 40 mV, the pacing impedance was 700 ohms, and the threshold 0.4 V at 0.5 msec.  A 10-V pacing did not stimulate the diaphragm.  With the right ventricular lead in satisfactory position, attention was turned to placement  of the atrial lead which was placed in the anterolateral wall of the right atrium where P-waves varied between 1.5 and 2 mV.  The pacing impedance was 500 ohms, and with the lead actively fixed, the threshold was 0.9 V at 0.5 msec.  With active fixation of both leads, there was a large injury current present.  With the atrial and ventricular leads in satisfactory position, they were secured to the subpectoralis fascia with a figure-of-eight silk suture. Sewing sleeve was secured with silk suture as well.  Electrocautery was then utilized to make the subcutaneous pocket.  Electrocautery was utilized to assure hemostasis.  The pocket was irrigated with antibiotic irrigation and the Medtronic Adapta L dual-chamber pacemaker, serial number [id number], was connected to the atrial and ventricular leads and placed back in the subcutaneous pocket where it was secured with silk suture.  The pocket was irrigated with antibiotic irrigation and the incision was closed with 2-0 and 3-0 Vicryl.  Benzoin and Steri-Strips were painted on the skin.  A pressure dressing was applied, and the patient was prepped for removal of his loop recorder.  After additional lidocaine was infiltrated into the subcutaneous tissue, a 3-cm incision was carried out over the old implantable loop recorder  incision.  Electrocautery was utilized to dissect down to the loop recorder pocket and the pocket was removed with gentle traction.  Sewing sleeves were also removed as well and the loop recorder pocket was irrigated with antibiotic irrigation and the incision was closed with 4- 0 Vicryl suture.  Additional benzoin and Steri-Strips were painted on the skin.  A pressure dressing was applied and the patient was returned to his room in satisfactory condition.  COMPLICATIONS:  There were no immediate procedure complications.  RESULTS:  This demonstrate successful implantation of a Medtronic dual- chamber pacemaker followed by  successful removal of a previously implantable loop recorder without immediate procedure complications.     Doylene Canning. Ladona Ridgel, MD     GWT/MEDQ  D:  06/04/2010  T:  06/05/2010  Job:  [id number]  Electronically Signed by Lewayne Bunting MD on 07/30/2010 05:01:29 PM

## 2010-08-10 ENCOUNTER — Encounter: Payer: Self-pay | Admitting: Internal Medicine

## 2010-08-18 NOTE — Letter (Signed)
Summary: Handout Printed  Printed Handout:  - *Patient Instructions-Urgent Care

## 2010-08-19 ENCOUNTER — Other Ambulatory Visit: Payer: Self-pay | Admitting: Internal Medicine

## 2010-08-25 ENCOUNTER — Encounter: Payer: Self-pay | Admitting: Internal Medicine

## 2010-09-04 LAB — SURGICAL PCR SCREEN: MRSA, PCR: NEGATIVE

## 2010-09-07 ENCOUNTER — Other Ambulatory Visit: Payer: Self-pay

## 2010-09-08 ENCOUNTER — Ambulatory Visit (INDEPENDENT_AMBULATORY_CARE_PROVIDER_SITE_OTHER): Payer: Medicare Other | Admitting: Internal Medicine

## 2010-09-08 ENCOUNTER — Encounter: Payer: Self-pay | Admitting: Internal Medicine

## 2010-09-08 VITALS — BP 146/69 | HR 67 | Ht 75.0 in | Wt 230.0 lb

## 2010-09-08 DIAGNOSIS — I495 Sick sinus syndrome: Secondary | ICD-10-CM

## 2010-09-08 DIAGNOSIS — IMO0001 Reserved for inherently not codable concepts without codable children: Secondary | ICD-10-CM

## 2010-09-08 DIAGNOSIS — R55 Syncope and collapse: Secondary | ICD-10-CM

## 2010-09-08 DIAGNOSIS — R350 Frequency of micturition: Secondary | ICD-10-CM

## 2010-09-08 DIAGNOSIS — Z950 Presence of cardiac pacemaker: Secondary | ICD-10-CM | POA: Insufficient documentation

## 2010-09-08 DIAGNOSIS — I10 Essential (primary) hypertension: Secondary | ICD-10-CM

## 2010-09-08 NOTE — Assessment & Plan Note (Signed)
He has had no recurrent episodes since I placed his PPM.

## 2010-09-08 NOTE — Assessment & Plan Note (Signed)
His device is working normally. Will recheck in several months. 

## 2010-09-08 NOTE — Assessment & Plan Note (Signed)
His blood pressure is slightly elevated. I have asked him to reduce his sodium intake.

## 2010-09-08 NOTE — Progress Notes (Signed)
HPI [name] returns today for followup. He is a pleasant 77 yo man with a h/o symptomatic bradycardia, s/p PPM, h/o SVT s/p ablation, atrial fibrillation and unexplained syncope. He underwent insertion of an ILR several months ago. He has not had recurrent syncop but was found to have marked bradycardia and has undergone PPM. He also has a h/o hypertension and dyslipidemia. He has felt well since then. No c/p, sob, or peripheral edema.   Allergies  Allergen Reactions  . Ciprofloxacin     REACTION: GI upset     Current Outpatient Prescriptions  Medication Sig Dispense Refill  . amLODipine (NORVASC) 5 MG tablet Take 5 mg by mouth daily.        Marland Kitchen atorvastatin (LIPITOR) 20 MG tablet Take 20 mg by mouth daily.        Marland Kitchen ezetimibe (ZETIA) 10 MG tablet Take 10 mg by mouth daily.        . iron polysaccharides (NU-IRON) 150 MG capsule 1 tab po qd       . nabumetone (RELAFEN) 500 MG tablet Take 500 mg by mouth 2 (two) times daily.        . OXYBUTYNIN CHLORIDE PO Take by mouth 2 (two) times daily.        . pantoprazole (PROTONIX) 40 MG tablet 1 tab po bid       . predniSONE (DELTASONE) 5 MG tablet TAKE 1 TABLET BY MOUTH ONCE A DAY  30 tablet  2  . rOPINIRole (REQUIP) 2 MG tablet 2 (two) times daily.       . temazepam (RESTORIL) 30 MG capsule Take 30 mg by mouth at bedtime as needed.        . triamcinolone (KENALOG) 0.1 % lotion As ditrected       . Vitamin D, Ergocalciferol, (DRISDOL) 50000 UNITS CAPS TAKE ONE CAPSULE BY MOUTH WEEKLY  5 capsule  3     Past Medical History  Diagnosis Date  . Hyperlipidemia   . HTN (hypertension)   . Prostate cancer     ROS:   All systems reviewed and negative except as noted in the HPI.   Past Surgical History  Procedure Date  . Replacement total knee      Family History  Problem Relation Age of Onset  . Hyperlipidemia       History   Social History  . Marital Status: Married    Spouse Name: N/A    Number of Children: N/A  . Years of  Education: N/A   Occupational History  . retired    Social History Main Topics  . Smoking status: Former Games developer  . Smokeless tobacco: Not on file  . Alcohol Use: Not on file  . Drug Use: Not on file  . Sexually Active: Not on file   Other Topics Concern  . Not on file   Social History Narrative  . No narrative on file     BP 146/69  Pulse 67  Ht 6\' 3"  (1.905 m)  Wt 230 lb (104.327 kg)  BMI 28.75 kg/m2  Physical Exam:  Well appearing NAD HEENT: Unremarkable Neck:  No JVD, no thyromegally Lymphatics:  No adenopathy Back:  No CVA tenderness Lungs:  Clear. Well healed PPM incision. HEART:  Regular rate rhythm, no murmurs, no rubs, no clicks Abd:  Flat, positive bowel sounds, no organomegally, no rebound, no guarding Ext:  2 plus pulses, no edema, no cyanosis, no clubbing Skin:  No rashes no nodules Neuro:  CN  II through XII intact, motor grossly intact  DEVICE  Normal device function.  See PaceArt for details.   Assess/Plan:

## 2010-09-09 ENCOUNTER — Other Ambulatory Visit (INDEPENDENT_AMBULATORY_CARE_PROVIDER_SITE_OTHER): Payer: Medicare Other

## 2010-09-09 DIAGNOSIS — I6529 Occlusion and stenosis of unspecified carotid artery: Secondary | ICD-10-CM

## 2010-09-10 ENCOUNTER — Telehealth: Payer: Self-pay | Admitting: Internal Medicine

## 2010-09-10 ENCOUNTER — Emergency Department (HOSPITAL_BASED_OUTPATIENT_CLINIC_OR_DEPARTMENT_OTHER)
Admission: EM | Admit: 2010-09-10 | Discharge: 2010-09-10 | Disposition: A | Discharge status: Home / Self Care | Payer: Medicare Other | Attending: Emergency Medicine | Admitting: Emergency Medicine

## 2010-09-10 ENCOUNTER — Emergency Department (INDEPENDENT_AMBULATORY_CARE_PROVIDER_SITE_OTHER): Payer: Medicare Other

## 2010-09-10 DIAGNOSIS — I4891 Unspecified atrial fibrillation: Secondary | ICD-10-CM | POA: Insufficient documentation

## 2010-09-10 DIAGNOSIS — E785 Hyperlipidemia, unspecified: Secondary | ICD-10-CM | POA: Insufficient documentation

## 2010-09-10 DIAGNOSIS — S060X1A Concussion with loss of consciousness of 30 minutes or less, initial encounter: Secondary | ICD-10-CM

## 2010-09-10 DIAGNOSIS — R55 Syncope and collapse: Secondary | ICD-10-CM | POA: Insufficient documentation

## 2010-09-10 DIAGNOSIS — M25569 Pain in unspecified knee: Secondary | ICD-10-CM

## 2010-09-10 DIAGNOSIS — Y93E1 Activity, personal bathing and showering: Secondary | ICD-10-CM | POA: Insufficient documentation

## 2010-09-10 DIAGNOSIS — R51 Headache: Secondary | ICD-10-CM | POA: Insufficient documentation

## 2010-09-10 DIAGNOSIS — W010XXA Fall on same level from slipping, tripping and stumbling without subsequent striking against object, initial encounter: Secondary | ICD-10-CM | POA: Insufficient documentation

## 2010-09-10 DIAGNOSIS — Z79899 Other long term (current) drug therapy: Secondary | ICD-10-CM | POA: Insufficient documentation

## 2010-09-10 DIAGNOSIS — IMO0002 Reserved for concepts with insufficient information to code with codable children: Secondary | ICD-10-CM | POA: Insufficient documentation

## 2010-09-10 DIAGNOSIS — Z043 Encounter for examination and observation following other accident: Secondary | ICD-10-CM

## 2010-09-10 DIAGNOSIS — Y92009 Unspecified place in unspecified non-institutional (private) residence as the place of occurrence of the external cause: Secondary | ICD-10-CM | POA: Insufficient documentation

## 2010-09-10 DIAGNOSIS — I10 Essential (primary) hypertension: Secondary | ICD-10-CM | POA: Insufficient documentation

## 2010-09-10 DIAGNOSIS — Z8739 Personal history of other diseases of the musculoskeletal system and connective tissue: Secondary | ICD-10-CM | POA: Insufficient documentation

## 2010-09-10 LAB — BASIC METABOLIC PANEL
CO2: 27 mEq/L (ref 19–32)
Calcium: 9.4 mg/dL (ref 8.4–10.5)
Chloride: 107 mEq/L (ref 96–112)
GFR calc Af Amer: >60 mL/min (ref >60)
Potassium: 4.8 mEq/L (ref 3.5–5.1)
Sodium: 144 mEq/L (ref 135–145)

## 2010-09-10 LAB — DIFFERENTIAL
Lymphs Abs: 0.7 10*3/uL (ref 0.7–4.0)
Monocytes Absolute: 0.8 10*3/uL (ref 0.1–1.0)
Monocytes Relative: 9 % (ref 3–12)
Neutro Abs: 7.3 10*3/uL (ref 1.7–7.7)
Neutrophils Relative %: 82 % — ABNORMAL HIGH (ref 43–77)

## 2010-09-10 LAB — POCT CARDIAC MARKERS
CKMB, poc: 1 ng/mL (ref 1.0–8.0)
Myoglobin, poc: 88 ng/mL (ref 12–200)
Troponin i, poc: <0.05 ng/mL (ref 0.00–0.09)

## 2010-09-10 LAB — CBC
Hemoglobin: 13.8 g/dL (ref 13.0–17.0)
MCH: 30.5 pg (ref 26.0–34.0)
MCV: 90.3 fL (ref 78.0–100.0)
RBC: 4.53 MIL/uL (ref 4.22–5.81)

## 2010-09-10 NOTE — Telephone Encounter (Signed)
Pt hit head on floor -talked with dr Lovell Sheehan and was sent to er

## 2010-09-10 NOTE — Telephone Encounter (Signed)
Pt fell getting out of shower this a.m. Pt was unconscious for a few minutes. Pt was disoriented about 15 to 20 mins. Pt can not put any weight on legs to walk. Pts lft knee is hurting. Putting ice on site. Pls advise. Pt is wondering is he should go to Dr Despina Hick office?

## 2010-09-15 NOTE — Procedures (Unsigned)
CAROTID DUPLEX EXAM  INDICATION:  Follow up known carotid artery disease.  HISTORY: Diabetes:  No. Cardiac:  Atrial fibrillation. Hypertension:  Yes. Smoking:  No. Previous Surgery:  No. CV History:  Asymptomatic. Amaurosis Fugax No, Paresthesias No, Hemiparesis No. Other:  Hyperlipidemia.                                      RIGHT             LEFT Brachial systolic pressure:         135               134 Brachial Doppler waveforms:         Normal            Normal Vertebral direction of flow:        Antegrade         Antegrade DUPLEX VELOCITIES (cm/sec) CCA peak systolic                   69                69 ECA peak systolic                   119               105 ICA peak systolic                   147               136 ICA end diastolic                   34                45 PLAQUE MORPHOLOGY:                  Mixed             Mixed PLAQUE AMOUNT:                      Minimal/moderate  Minimal/moderate PLAQUE LOCATION:                    CCA, ICA, ECA     ICA, ECA  IMPRESSION: 1. Bilateral internal carotid artery velocities suggest 40% to 59%     stenosis. 2. Antegrade vertebral arteries bilaterally. 3. Stable from previous study.  ___________________________________________ Larina Earthly, M.D.  EM/MEDQ  D:  09/09/2010  T:  09/09/2010  Job:  [id number]

## 2010-09-23 LAB — BASIC METABOLIC PANEL
BUN: 18 mg/dL (ref 6–23)
CO2: 23 mEq/L (ref 19–32)
Calcium: 9.6 mg/dL (ref 8.4–10.5)
Chloride: 108 mEq/L (ref 96–112)
Creatinine, Ser: 1.16 mg/dL (ref 0.4–1.5)
GFR calc Af Amer: >60 mL/min (ref >60)
GFR calc non Af Amer: >60 mL/min (ref >60)
Glucose, Bld: 97 mg/dL (ref 70–99)
Potassium: 5.1 mEq/L (ref 3.5–5.1)
Sodium: 140 mEq/L (ref 135–145)

## 2010-09-23 LAB — URINALYSIS, ROUTINE W REFLEX MICROSCOPIC
Bilirubin Urine: NEGATIVE
Glucose, UA: NEGATIVE mg/dL
Ketones, ur: NEGATIVE mg/dL
Leukocytes, UA: NEGATIVE
Nitrite: NEGATIVE
Protein, ur: NEGATIVE mg/dL
Specific Gravity, Urine: 1.011 (ref 1.005–1.030)
Urobilinogen, UA: 0.2 mg/dL (ref 0.0–1.0)
pH: 5.5 (ref 5.0–8.0)

## 2010-09-23 LAB — CBC
HCT: 40.8 % (ref 39.0–52.0)
Hemoglobin: 14 g/dL (ref 13.0–17.0)
MCHC: 34.4 g/dL (ref 30.0–36.0)
MCV: 91.5 fL (ref 78.0–100.0)
Platelets: 217 10*3/uL (ref 150–400)
RBC: 4.45 MIL/uL (ref 4.22–5.81)
RDW: 13.7 % (ref 11.5–15.5)
WBC: 7.7 10*3/uL (ref 4.0–10.5)

## 2010-09-23 LAB — TYPE AND SCREEN
ABO/RH(D): A POS
Antibody Screen: NEGATIVE

## 2010-09-23 LAB — ABO/RH: ABO/RH(D): A POS

## 2010-09-23 LAB — URINE CULTURE
Colony Count: NO GROWTH
Culture: NO GROWTH

## 2010-09-23 LAB — URINE MICROSCOPIC-ADD ON

## 2010-11-03 NOTE — Consult Note (Signed)
NAMESMOKEY, Christopher Baker               ACCOUNT NO.:  [id number]   MEDICAL RECORD NO.:  [id number]          PATIENT TYPE:  INP   LOCATION:  6742                         FACILITY:  [facility]   PHYSICIAN:  Marlan Palau, M.D.  DATE OF BIRTH:  [birth year]   DATE OF CONSULTATION:  11/04/2006  DATE OF DISCHARGE:                                 CONSULTATION   HISTORY OF PRESENT ILLNESS:  Christopher Baker is a 77 year old, right-  handed, white male born [birth year], with a history of hypertension  and prior history of syncope.  The patient comes to the [facility]  Emergency Room after problems that were noted today beginning around  1:30 p.m. or 2 p.m..  The patient was playing golf and apparently was  noted to fall over backwards, but it is not clear that he passed out.  The patient seemed to be well afterwards.  He continued to play another  two holes, but then began becoming somewhat confused and  could not  remember playing golf, he did know where he was, asking the same  questions again and again.  The patient was brought into the hospital  for evaluation.  He appeared to have no other complaints such as  headache, numbness, weakness on the face, arms, legs, balance issues,  dizziness, nausea or vomiting.  A CT scan of the head showed no acute  changes, some mild small vessel disease noted and small, deep, right,  frontal infarct noted that is old.  Neurology is asked to see this  patient for further evaluation.  The patient has continued to have poor  memory for events of the day asking questions again and again.   PAST MEDICAL HISTORY:  1. Probable transient global amnesia.  2. Hypertension.  3. Arthritis.  4. Episodes of tachycardia.  5. History of syncope.  6. Right total knee replacement.  7. Penile implant with peroneus disease.  8. History of left inguinal hernia repair.  9. Dyslipidemia.  10.Prostate cancer on Lupron.   MEDICATIONS:  1. Benicar/HCT 40/25 one daily.  2.  Protonix 40 mg daily.  3. Prednisone 10 mg daily.  4. DynaCirc 5 mg daily.  5. Vytorin 10/20 mg daily.  6. Relafen 750 mg b.i.d.  7. Aspirin 320 mg daily.   ALLERGIES:  No known drug allergies.   HABITS:  Does not smoke.  Drinks alcohol on occasion.   SOCIAL HISTORY:  The patient lives in the area of [place], [place].  He is married, lives with his wife.  He has two children who  are alive and well.   FAMILY HISTORY:  Mother died of unknown cause, may have had a stroke.  Father had died following a motor vehicle accident.  The patient has no  brothers or sisters.   REVIEW OF SYSTEMS:  Notable for no recent fevers, chills.  The patient  denies headache today, although currently has a very mild headache.  The  patient reports no shortness of breath, chest pains, palpitations of the  heart.  Denies any abdominal pain, nausea, vomiting.  Denies numbness or  weakness on the face, arms, legs or gait disturbance.   PHYSICAL EXAMINATION:  VITAL SIGNS:  Blood pressure is 164/75, heart  rate 91, respirations 20, temperature afebrile.  GENERAL:  The patient is a well-developed, white male who is alert and  cooperative at the time examination.  HEENT:  Examination of head is atraumatic.  Pupils are equal, round and  react to light.  Disks are flat bilaterally.  NECK:  Supple.  No carotid bruits noted.  RESPIRATORY:  Clear.  CARDIOVASCULAR:  A regular rate and rhythm.  No obvious murmurs or rubs  are noted.  EXTREMITIES:  Without significant edema, although 1+ edema is noted on  the left ankle.  NEUROLOGIC:  Cranial nerves as above.  Facial symmetry is present.  The  patient has good sensation of face to pinprick, soft touch bilaterally.  He has good strength with facial muscles, muscles with head turning and  shoulder shrug bilaterally.  Speech is well-enunciated and not aphasic.  Motor testing reveals 5/5 strength in all fours.  Good symmetric motor  and tone is noted  throughout.  Sensory testing is intact to pinprick,  soft touch and vibratory sensation throughout.  The patient has good  finger-nose-finger, heel-to-shin.  Gait was not tested and no drift is  seen.  Deep tendon reflexes are symmetric except for depression of the  right knee jerk reflexes compared to the left.  Toes neutral  bilaterally.   LABORATORY DATA AND X-RAY FINDINGS:  Sodium 138, potassium 4.9, chloride  of 110, BUN 27, glucose of 111, bicarb 26.3, CO2 28.  Hematocrit 40,  hemoglobin 13.6.  MB fraction 1.8, troponin I less than 0.05.  White  count 7.5, hemoglobin 13.5, hematocrit 40, MCV 101.7, platelets of 238.  Sodium 141, potassium 4.1, chloride of 106, CO2 22, glucose 109, BUN of  24, creatinine 1.2, calcium 9.4.  CK-MB of 3.5.  Total CK of 172.   CT scan of head as above.   IMPRESSION:  1. Onset of amnesia, probable transient global amnesia.  2. Hypertension.  3. Prostate cancer.   This patient has an unremarkable examination.  The patient has some  problems with memory with the current deficits.  The current deficit is  short-term memory.  The patient cannot recall three words at 3 minutes,  but is able to recall the same words again at 1-1/2 minutes.  Will  pursue further workup at this point.   RECOMMENDATIONS:  1. MRI scan of brain.  2. MRI angiogram.  3. EEG study as an outpatient.  4. Check B12 level and homocystine level.  5. Aspirin therapy.  6. Will follow the patient's clinical course while in-house.      Marlan Palau, M.D.  Electronically Signed     CKW/MEDQ  D:  11/04/2006  T:  11/05/2006  Job:  [id number]   cc:   [facility]

## 2010-11-03 NOTE — Discharge Summary (Signed)
Christopher Baker, Christopher Baker               ACCOUNT NO.:  [id number]   MEDICAL RECORD NO.:  [id number]          PATIENT TYPE:  INP   LOCATION:  5148                         FACILITY:  [facility]   PHYSICIAN:  Anselm Pancoast. Weatherly, M.D.DATE OF BIRTH:  May 11, 1934   DATE OF ADMISSION:  01/31/2008  DATE OF DISCHARGE:  02/02/2008                               DISCHARGE SUMMARY   CHIEF COMPLAINT/REASON FOR ADMISSION:  Christopher Baker is a 77 year old male  patient who is on chronic low dose prednisone, who has been having  abdominal pain on and over about a month right sided in nature, most  recent episode after eating a high fat meal.  He presented to the ER  where he was found to have white count of 11,300, neutrophils 85%, LFTs  normal, and abnormal CT revealing cholecystitis with cystic duct stone.  Abdominal exam was normal without any tenderness.  He did have a soft  nonreducible nontender umbilical hernia.   The patient was admitted with following diagnoses:  1. Biliary colic.  2. Acute cholecystitis.  3. Psoriatic arthritis on chronic low dose prednisone.   HOSPITAL COURSE:  The patient was admitted with plans to allow liquids  and then NPO after midnight for possible surgical intervention.  He was  started empirically on IV Unasyn and plans were to repeat labs the  following day as well.   On February 01, 2008, the patient was taken to the OR with preoperative  diagnosis of cholelithiasis and cystic duct stones.  He underwent a  laparoscopic cholecystectomy and normal intraoperative cholangiogram.  In addition, he had an incidental repair of a small umbilical hernia  defect, which was noted on initial clinical exam.   By postop day #1, the patient was tolerating a solid diet, although he  was having what he complains of gas pain after eating and he had not had  a BM since admission.  He had been in the hospital for a total 3 days.  His abdomen was obese and slightly distended, but bowel sounds  were  present.  He was nontender except over his incisions, it was opted to go  ahead send the patient home.  Gave him stool softener for improving his  bowel regimen.  He states, the Vicodin was not helping [place] pain, so he  was changed to Percocet.   FINAL DISCHARGE DIAGNOSES:  1. Abdominal pain and biliary colic, secondary to impacted stone in      neck of gallbladder.  2. Status post laparoscopic cholecystectomy for acute cholecystitis.   DISCHARGE MEDICATIONS:  The patient will resume the following  medications:  1. ReQuip 1 mg hour sleep.  2. Prednisone 5 mg daily.  3. Vytorin 10/40 daily.  4. Zegerid 40/1100 at bedtime.  5. Aspirin 81 mg daily.  6. B12 daily.  7. Benicar with hydrochlorothiazide 40/12.5 daily.  8. Vicodin p.r.n. pain.  9. Voltaren 1% gel as needed.  10.Nabumetone 500 mg b.i.d.  11.Zyrtec 10 mg at bedtime.  12.Amlodipine 5 mg at bedtime.   NEW MEDICATIONS:  1. Percocet 5/325 one to two tablets every 4 hours needed  for pain.  2. Over-the-counter MiraLax and Colace as directed for constipation.   OTHER DISCHARGE INSTRUCTIONS:  Please see the preprinted postoperative  laparoscopic cholecystectomy sheet for [facility].  The patient will  follow up with [facility] in 1-2 weeks for hospital follow  up.      Allison L. Rennis Harding, N.P.    ______________________________  Anselm Pancoast. Zachery Dakins, M.D.    ALE/MEDQ  D:  03/12/2008  T:  03/13/2008  Job:  [id number]

## 2010-11-03 NOTE — Assessment & Plan Note (Signed)
[facility]                            CARDIOLOGY OFFICE NOTE   NAME:Christopher Baker, Christopher Baker                      MRN:          [id number]  DATE:04/24/2008                            DOB:          1934-02-26    PRIMARY CARDIOLOGIST:  Jesse Sans. Wall, MD, [facility].   ELECTROPHYSIOLOGIST:  Doylene Canning. Ladona Ridgel, MD.   PRIMARY CARE PHYSICIAN:  Stacie Glaze, MD.   HISTORY OF PRESENT ILLNESS:  This is a pleasant 77 year old white male  patient who has undergone AV nodal reentrant tachycardia or with history  of AV nodal reentrant tachycardia, status post radiofrequency catheter  ablation in June 2008.  The patient's SVT was unsustainable during  procedure and radiofrequency catheter ablation was delivered through a  very small Calot triangle.  He has had negative Myoview in the past and  normal echo in June 2008.   The patient was golfing on Saturday, which he developed shortness of  breath and palpitations with a heart rate in the 120s.  He has been over  to pick up his golf ball and became dizzy.  He said it was a kind of  muggy that day and thought he might be dehydrated, so went to the club  and drank a Gatorade and went home.  He said it took 3-1/2 hours to get  his heart rate down in the 70s.  Yesterday at work, he was sitting at  his desk and his heart rate jumped up to 90 before coming back down to  70.  He does complain of some soreness in his sternum when he moves in  and touches his chest, but he has been working out with a trainer and  lifting weights.  He has also had sinus trouble, cough, and congestion  in which he is taking Zyrtec for, but nothing else.   ALLERGIES:  No known drug allergies.   CURRENT MEDICATIONS:  1. Relafen 750 mg 2 daily.  2. ReQuip 1 mg daily.  3. Prednisone 5 mg daily.  4. Vytorin 10/40 daily.  5. Aspirin 81 mg daily.  6. Enbrel daily.  7. Benicar Daily.  8. Amlodipine 5 mg daily.  9. Levaquin daily.   PHYSICAL  EXAMINATION:  GENERAL:  This is a pleasant 77 year old white  male, in no acute distress.  VITAL SIGNS:  Blood pressure 138/69, pulse 68, and weight 243.  NECK:  Without JVD, HJR, bruit, or thyroid enlargement.  LUNGS:  Clear anterior, posterior, and lateral.  HEART:  Regular rate and rhythm at 68 beats per minute.  Normal S1 and  S2.  No murmur, rub, bruit, thrill, or heave noted.  ABDOMEN:  Soft  without organomegaly, mass, lesions, or abnormal tenderness.  EXTREMITIES:  Without cyanosis or edema.  He has good distal pulses.   IMPRESSION:  1. Recurrent palpitations associated with shortness of breath, rule      out recurrent supraventricular tachycardia or other arrhythmia.  2. Some type of atrial tachycardia on current EKG.  3. History of atrioventricular nodal reentrant tachycardia, status      post radiofrequency ablation in June 2008  by Dr. Ladona Ridgel.  4. History of negative Myoview in the past.  5. History of normal left ventricular function on echo in June 2008.  6. Hypertension.  7. Dyslipidemia.  8. History of cirrhotic arthritis.  9. Restless legs syndrome.  10.History of prostate cancer treated with Lupron.   PLAN:  I reviewed the EKG with both Dr. Daleen Squibb and Dr. Johney Frame.  He does  have first-degree AV block at the beginning of EKG, but then it looks  like he has some type of atrial tachycardia according to Dr. Johney Frame.  They recommended he have an event recorder and then follow up with Dr.  Ladona Ridgel who will also check a BMET and TSH today.      Jacolyn Reedy, PA-C  Electronically Signed      Verne Carrow, MD  Electronically Signed   ML/MedQ  DD: 04/24/2008  DT: 04/25/2008  Job #: [id number]

## 2010-11-03 NOTE — Procedures (Signed)
CAROTID DUPLEX EXAM   INDICATION:  Follow up known carotid artery disease.   HISTORY:  Diabetes:  No.  Cardiac:  No.  Hypertension:  Yes.  Smoking:  Quit about eight years ago.  Previous Surgery:  CV History:  Amaurosis Fugax No, Paresthesias No, Hemiparesis No.                                       RIGHT             LEFT  Brachial systolic pressure:         127               130  Brachial Doppler waveforms:         Biphasic          Biphasic  Vertebral direction of flow:        Antegrade         Antegrade  DUPLEX VELOCITIES (cm/sec)  CCA peak systolic                   100               85  ECA peak systolic                   195               189  ICA peak systolic                   169               189  ICA end diastolic                   39                40  PLAQUE MORPHOLOGY:                  Heterogenous      Heterogenous  PLAQUE AMOUNT:                      Moderate          Moderate  PLAQUE LOCATION:                    ICA, ECA          ICA, ECA    IMPRESSION:  1. 40-59% stenosis noted in bilateral external carotid artery      stenoses, left > right.  2. Antegrade bilateral vertebral arteries.     ___________________________________________  Larina Earthly, M.D.   MG/MEDQ  D:  01/03/2008  T:  01/03/2008  Job:  [id number]

## 2010-11-03 NOTE — Assessment & Plan Note (Signed)
[facility]                         ELECTROPHYSIOLOGY OFFICE NOTE   NAME:Moomaw, KYRE JEFFRIES                      MRN:          [id number]  DATE:12/01/2006                            DOB:          [birth year]    Mr. Palacios is referred today by Jesse Sans. Wall, MD, for treatment and  evaluation of SVT.   HISTORY OF PRESENT ILLNESS:  The patient is a very pleasant 77 year old  male with a history of tachypalpitations which have been present for  approximately 6 months.  The patient notes that his heart will start  racing suddenly and is associated with dizziness and lightheadedness and  will stop suddenly.  He has never had frank syncope but had multiple  episodes of near-syncope and one episode where he actually fell down  with his heart racing, though he denies actual loss of consciousness.  These are not associated with chest pain per se.  A CT of the head was  done in the setting of one of his episodes, which demonstrated some  small-vessel disease and a small microhemorrhage in the left temporal  lobe thought secondary to hypertension.   His additional past medical history is notable for dyslipidemia.   SOCIAL HISTORY:  The patient denies tobacco or ethanol use except that  he does drink an occasional alcoholic beverage.  No ethanol abuse,  however.   REVIEW OF SYSTEMS:  As noted in the HPI, also notable for headache and  dizziness.  Otherwise, all systems were reviewed and found to be  negative.   PHYSICAL EXAMINATION:  GENERAL:  Notable for a pleasant, well-appearing  76 year old man in no distress.  VITAL SIGNS:  The initial blood pressure was 130/76, the pulse 76 and  regular, the respirations were 18.  Weight was 227 pounds.  HEENT:  Normocephalic and atraumatic.  The pupils are equal and round.  The oropharynx is moist.  His sclerae were anicteric.  NECK:  No jugular venous distention.  There was no thyromegaly.  The  trachea is midline.   The carotids are 2+ and symmetric.  LUNGS:  Clear bilaterally to auscultation.  There are no wheezes, rales  or rhonchi.  There is no increased work of breathing.  CARDIOVASCULAR:  A regular rate and rhythm with normal S1 and S2.  There  were no murmurs, rubs or gallops.  PMI was not enlarged or laterally  displaced.  EXTREMITIES:  No cyanosis, clubbing or edema.  The pulses are 2+ and  symmetric.  NEUROLOGIC:  Alert and oriented x3 with cranial nerves intact.  Strength  is 5/5 and symmetric.   The cardiac Myoview demonstrated no evidence of ischemia and normal LV  systolic function.  The 2 D echo was also normal.   IMPRESSION:  Documented supraventricular tachycardia with rates of up to  160 beats per minute associated with near-syncope and severe  lightheadedness.   DISCUSSION:  I have discussed the treatment options with the patient and  his wife.  One option would be to start him on medical therapy with a  beta blocker.  A second option would be to proceed  with catheter  ablation.  Because of the patient's severe symptoms, he is very  concerned about his SVT and would like to have it ablated if at all  possible and for this reason I have discussed the risks, benefits, goals  and __________  of the procedure and he wishes to proceed.  This will be  scheduled at the earliest possible convenient time.     Doylene Canning. Ladona Ridgel, MD  Electronically Signed    GWT/MedQ  DD: 12/01/2006  DT: 12/02/2006  Job #: [id number]   cc:   Thomas C. Daleen Squibb, MD, [facility]  Stacie Glaze, MD

## 2010-11-03 NOTE — Assessment & Plan Note (Signed)
[facility]                            CARDIOLOGY OFFICE NOTE   NAME:Christopher Baker, Christopher Baker                      MRN:          [id number]  DATE:03/09/2007                            DOB:          September 11, 1933    Mr. Sieg returns today. I saw him initially and was concerned he was  having SVT. He had a negative stress Myoview and a normal 2D echo.   He was referred to Dr. Lewayne Bunting. SVT was documented and he underwent  AV nodal ablation on December 07, 2006.   Dr. Ladona Ridgel saw him back and he was having occasional palpitations  particularly when he bent over on the golf course. He was complaining of  little bit of lightheadedness as well. This is totally resolved now.   He is very active working 3 days a week with a [employer] that  he started. He has been playing a lot of golf. He says he feels  remarkably well.   He carries a diagnosis of hypertension, hyperlipidemia which is followed  by Dr. Darryll Capers.   His meds are unchanged except he is now on Norvasc 5 mg a day.   His blood pressure today is 140/70, his pulse is 70 and regular, his  weight is 235.  HEENT: Unremarkable and unchanged. Carotid upstrokes are equal and  bilateral without bruits. There is no JVD. Thyroid is not enlarged.  Trachea is midline.  LUNGS: Clear.  HEART: Reveals a regular rate and rhythm without gallop.  ABDOMINAL EXAM: Soft, good bowel sounds.  EXTREMITIES: Reveal no edema. Pulses are intact.  NEURO EXAM: Intact.   ASSESSMENT/PLAN:  Mr. Cookston is doing well. He is status post  symptomatic SVT ablation. He is doing well. I will make no changes in  his medical program. I will turn his care back over to Dr. Darryll Capers.  We will see him back on a p.r.n. basis.     Thomas C. Daleen Squibb, MD, [facility]  Electronically Signed    TCW/MedQ  DD: 03/09/2007  DT: 03/09/2007  Job #: [id number]

## 2010-11-03 NOTE — Procedures (Signed)
CAROTID DUPLEX EXAM   INDICATION:  Followup, known carotid artery disease.   HISTORY:  Diabetes:  No.  Cardiac:  No.  Hypertension:  Yes.  Smoking:  Quit seven years ago.  Previous Surgery:  CV History:  Amaurosis Fugax No, Paresthesias No, Hemiparesis No                                       RIGHT             LEFT  Brachial systolic pressure:         140               140  Brachial Doppler waveforms:         Biphasic          Biphasic  Vertebral direction of flow:        Antegrade         Antegrade  DUPLEX VELOCITIES (cm/sec)  CCA peak systolic                   102               79  ECA peak systolic                   187               166  ICA peak systolic                   160               205  ICA end diastolic                   48                71  PLAQUE MORPHOLOGY:                  Heterogenous      Heterogenous  PLAQUE AMOUNT:                      Mild              Moderate  PLAQUE LOCATION:                    ICA, ECA          ICA, ECA   IMPRESSION:  1. 60-79% stenosis noted in the left internal carotid artery.  2. 40-59% stenosis noted in the right internal carotid artery.  3. Antegrade bilateral vertebral arteries.   ___________________________________________  Larina Earthly, M.D.   MG/MEDQ  D:  04/19/2007  T:  04/20/2007  Job:  [id number]

## 2010-11-03 NOTE — Assessment & Plan Note (Signed)
[facility]                            CARDIOLOGY OFFICE NOTE   NAME:Christopher Baker, Christopher Baker                      MRN:          [id number]  DATE:11/18/2006                            DOB:          04/06/1934    I was asked by Dr. Darryll Capers to evaluate Ancil Boozer, a delightful  77 year old married white male with syncope and rapid heart beat.   HISTORY OF PRESENT ILLNESS:  He is a 77 year old married white male. He  is a retired Surveyor, minerals. He is extremely active, exercising on a regular  basis, and playing golf.   About a year ago, he had a syncopal event. He was worked up and there  was no etiology found.   About three weeks ago while on the golf course, his partner turned  around and found him flat on the ground. He woke up and he had  significant post-syncopal amnesia. He was evaluated the [facility] ER.  Of note, he had no pre-monitory symptoms whatsoever. This was on May  17th. He had a CT of the head which showed no acute intracranial  abnormality. He subsequently had an MRI with and without contrast. He  had no significant intracranial stenosis, atrophy and chronic small  vessel ischemic changes, no acute infarct, and no aneurysm. He also had  a CT of the chest which showed no evidence of pulmonary emboli and only  mild pulmonary emphysema. Chest x-ray was relatively unremarkable except  for biapical plural thickening.   He was in consultation by Dr. Lesia Sago. Dr. Anne Hahn was uncertain of  the cause of the event, but recommended in addition an EEG. He is to  have that done next week.   Because of complaining about intermittent rapid heart beat over the last  several months, he had a monitor placed yesterday. He is scheduled for a  2D echo on Monday.   He will state that he could be sitting still or exercising and feel his  heart rate all of a sudden take off. He checks his pulse in his carotids  and it is 150 beats-per-minute. It can  last for seconds or it can last  for minutes. When it lasts for minutes, it is associated with pre-  syncope. He has not fainted with this.   CURRENT MEDICATIONS:  1. Relafen 750 mg 2 daily.  2. Requip 1 mg daily for restless legs.  3. Cymbalta 30 mg daily for depression.  4. Prednisone 5 mg daily.  5. Cerefolin for low iron.  6. Vytorin 10/40 daily.  7. DynaCirc CR 5 mg daily.  8. Aspirin 81 mg daily.  9. Enbrel shot once weekly.  10.Benicar unknown dose daily.   He carries a diagnosis of hypertension, hyperlipidemia, depression, and  arthritis.   He does not smoke. He has smoked in the past. He rarely drinks alcohol.  He exercises on a regular basis.   SURGERY:  He has had knee surgery, hernia surgery, and shoulder surgery.   FAMILY HISTORY:  Negative for premature coronary disease or sudden  cardiac death.   SOCIAL HISTORY:  He  is a Advertising account planner. He is married. His  wife is with him today. They have two children.   REVIEW OF SYSTEMS:  Other than the HPI, he has some seasonal allergies  and hay fever. He has had prostate cancer in 2007 and he has psoriatic  arthritis.   PHYSICAL EXAMINATION:  VITAL SIGNS:  Blood pressure 135/70 in the left  arm, pulse 61 and regular. EKG shows sinus rhythm with a first grade AV  block of 240 milliseconds. His QTC is normal. His QRS is normal. He has  non-specific ST segment changes. He is 6 foot 3 inches and weighs 230.  HEENT:  Normocephalic atraumatic, PERRLA, extraocular movements intact,  sclerae clear, he wears glasses, facial symmetry is normal, dentition is  satisfactory.  NECK:  Supple, carotid upstrokes are equal bilaterally without bruits,  there is no JVD, thyroid is not enlarged, trachea is midline.  LUNGS:  Clear.  HEART:  Non-displaced PMI, he has a normal S1, S2, without gallop. Soft  systolic murmur at the apex, left sternal border.  ABDOMEN:  Soft with good bowel sounds. There is no midline bruit. There  is  hepatomegaly.  EXTREMITIES:  Reveal no cyanosis, clubbing, or edema, pulses are intact.  NEUROLOGIC:  Intact.  SKIN:  Unremarkable.   ASSESSMENT:  1. Probable SVT by history. This is associated with dizziness, but he      has not had syncope. Whether or not his syncope is related to this      is unknown. I suspect that it may be, but it is going to be      difficult to prove.  2. Syncope. No obvious etiology other than #1. I would recommend him      follow up on the EEG with the history of global amnesia post event.      He will follow up on this next week with Dr. Anne Hahn.  3. Hypertension.  4. Hyperlipidemia.  5. Psoriatic arthritis.  6. Depression.  7. Restless legs syndrome.  8. History of prostate carcinoma.   RECOMMENDATIONS:  1. Continue to wear the Holter monitor. He has already recorded a      couple of events. We will check this early next week.  2. Change his DynaCirc to Diltiazem extended release 180 mg daily.  3. 2D echocardiogram on Monday.  4. Exercise rest stress Myoview next week to rule out obstructive      coronary disease with his risk factors and unexplained syncope.  5. EEG with Dr. Anne Hahn.   If he has SVT and his echocardiogram and stress Myoview are negative, I  will refer him to electrophysiology. I think he is an excellent  candidate for ablation. He seems receptive to this.     Thomas C. Daleen Squibb, MD, [facility]  Electronically Signed    TCW/MedQ  DD: 11/18/2006  DT: 11/19/2006  Job #: [id number]   cc:   Stacie Glaze, MD  C. Lesia Sago, M.D.

## 2010-11-03 NOTE — Procedures (Signed)
CAROTID DUPLEX EXAM   INDICATION:  Followup evaluation of known carotid artery disease.   HISTORY:  Diabetes:  No.  Cardiac:  No.  Hypertension:  Yes.  Smoking:  Former smoker.  Previous Surgery:  No.  CV History:  The patient reports no cerebrovascular symptoms at this  time.  Amaurosis Fugax No, Paresthesias No, Hemiparesis No                                       RIGHT             LEFT  Brachial systolic pressure:         148               148  Brachial Doppler waveforms:         Triphasic         Triphasic  Vertebral direction of flow:        Antegrade         Antegrade  DUPLEX VELOCITIES (cm/sec)  CCA peak systolic                   92                111  ECA peak systolic                   145               150  ICA peak systolic                   145               130  ICA end diastolic                   57                46  PLAQUE MORPHOLOGY:                  Soft              Mixed irregular  PLAQUE AMOUNT:                      Mild              Moderate  PLAQUE LOCATION:                    Proximal ICA      Proximal ICA   IMPRESSION:  1. 40-59% right ICA stenosis.  2. Distal left ICA peak systolic velocity 223 cm/s, end diastolic      velocity 74 cm/s suggestive of a 60-79% stenosis.  3. Bilateral ECA stenosis.  4. Distal internal carotid arteries demonstrate increased velocities      without significant plaque or tortuosity.   ___________________________________________  Larina Earthly, M.D.   MC/MEDQ  D:  01/03/2009  T:  01/03/2009  Job:  [id number]

## 2010-11-03 NOTE — Op Note (Signed)
NAMEJOHANN, SANTONE               ACCOUNT NO.:  [id number]   MEDICAL RECORD NO.:  [id number]          PATIENT TYPE:  OIB   LOCATION:  6532                         FACILITY:  [facility]   PHYSICIAN:  Doylene Canning. Ladona Ridgel, MD    DATE OF BIRTH:  [birth year]   DATE OF PROCEDURE:  12/02/2006  DATE OF DISCHARGE:  12/03/2006                               OPERATIVE REPORT   PROCEDURE PERFORMED:  Electrophysiologic study and radio frequency  catheter ablation of AV node reentry tachycardia.   INTRODUCTION:  The patient is 77 year old man referred to me by Dr. Juanito Doom for evaluation of SVT associated with near syncope.  The patient  notes episodes of tachy palpitations which had been present for the last  six months associated with near syncope.  He is now referred for  ablation.  Of note, the patient was offered the option of beta blocker  therapy, initially, but refused.  Of note, he does have very severe  arthritis and beta blockers have been known to worsen psoriasis.   PROCEDURE:  After informed consent was obtained, the patient was taken  to the diagnostic EP lab in a fasting state.  After the usual  preparation and draping, intravenous fentanyl and midazolam were given  for sedation.  A 6-French hexapolar catheter was inserted percutaneously  in the right jugular vein and advanced to the coronary sinus.  A 5-  French quadripolar catheter was inserted percutaneously in the right  femoral vein and advanced to the RV apex.  A 5-French quadripolar  catheter was inserted percutaneously in the right femoral vein and  advanced to the His bundle region.  After measurement of the basic  intervals, rapid ventricular pacing was carried out from the RV apex and  stepwise decreased down to 500 milliseconds where VA Wenckebach was  observed.  During rapid ventricular pacing, the atrial activation was  midline and decremental.  Next, programmed ventricular stimulation was  carried out from the RV apex at a  base drive cycle length of 782  milliseconds.  The S1-S2 interval was stepwise decreased down to 400  milliseconds where retrograde AV node ERP was observed.  During  programmed ventricular stimulation, the atrial activation was midline  and decremental.  There was no inducible arrhythmias.  Next, programmed  atrial stimulation was carried out from the coronary sinus at a base  drive cycle length of 956 and 600 milliseconds.  The S1-S2 interval was  stepwise decreased down to 280 milliseconds where AV node ERP was  observed.  During probing stimulation, there multiple AH jumps and echo  beats and double echo beats and triple echo beats, but no inducible  sustained SVT.  Rapid atrial pacing was then carried out from the  coronary sinus and the high right atrium was stepwise decreased down to  450 milliseconds where AV Wenckebach was observed.  During rapid atrial  pacing, the PR interval was greater than the RR interval, but no  inducible SVT could be observed.  Next, Isoproterenol was infused at  rates from 1 to 4 mcg per minute and additional rapid atrial  pacing and  programmed atrial stimulation was carried out.  The patient did have  nonsustained SVT but typically no more than 3 beats at a time.  There  was also retrograde block in the fast pathway resulting in termination  of the tachycardia.  Despite different levels of Isoproterenol infusion,  sustained SVT could not be induced.  Because the patient has documented  clinical SVT and because it was felt that sedation had in fact resulted  in him not being inducible for sustained SVT and because of his very  prominent slow pathway with jumps and echo beats, the 7-French  quadripolar ablation catheter was maneuvered into Koch's triangle and  mapping was carried out.  This demonstrated an unusually small Koch's  triangle which was oriented anteriorly.  A total of 13 RF energy  applications were delivered.  These were typically delivered  in very  short bursts as during RF energy application, there was accelerated  junctional rhythm with retrograde block to the atrium.  These findings  could not be stopped by Isoproterenol infusion.  With all the above, it  was deemed after multiple RF energy applications, not to persist in  additional catheter ablation for concerns of fear of development of  complete heart block.  It should be noted, however, that rapid atrial  pacing did not induce any additional SVT and AV Wenckebach was noted  with the PR interval less than the RR interval during rapid pacing.  During programmed atrial stimulation following ablation, there was a  single echo beats and PR prolongation.  Again, however, despite the  interest in giving additional RF energy application, it was deemed most  appropriate to discontinue additional RF energy application and see how  the patient did rather than submit him to an increased risk for the  development of complete heart block and the requirement of lifelong  pacemaker treatment.  At this point, the catheters were removed,  hemostasis was assured, and the patient was returned to his room in  satisfactory condition.   COMPLICATIONS:  There were no immediate procedure complications.   RESULTS:  A.  Baseline ECG.  Baseline ECG demonstrates sinus rhythm with  first degree AV block.  There is no pre-excitation.  B.  Baseline intervals. Sinus node cycle length was 988 milliseconds.  The HV interval was 34. The QRS duration was 125 milliseconds.  C.  Rapid ventricular pacing.  Rapid ventricular pacing from the RV apex  demonstrated VA Wenckebach cycle length of 500 milliseconds.  During  rapid ventricular pacing, the atrial activation was midline and  decremental.  D.  Programmed ventricular stimulation.  Programmed ventricular  stimulation was carried out from the RV apex at a base drive cycle length of 914 milliseconds.  The S1-S2 interval was stepwise decreased  down  to 400 milliseconds where retrograde AV node ERP was observed.  During programmed ventricular stimulation, there was no inducible SVT or  VT and the atrial activation was midline and decremental.  E.  Rapid atrial pacing.  Rapid atrial pacing was carried out in the  high right atrium to the coronary sinus at a base drive cycle lengths of  782 milliseconds stepwise decreased down to 450 milliseconds where AV  Wenckebach was observed.  During rapid atrial pacing, the PR interval  was initially greater than the RR interval and there was no inducible  SVT.  However, during Isoproterenol infusion, again, there was no  inducible SVT.  F.  Programmed atrial stimulation.  Programmed atrial  stimulation was  carried out from the coronary sinus and the high right atrium at a base  drive cycle length of 045 milliseconds.  The S1-S2 interval was stepwise  decreased down to 280 milliseconds where AV node ERP was observed.  During programmed atrial stimulation, there were multiple AH jumps and  echo beats and nonsustained SVT induced.  G.  Arrhythmias observed.  1. Nonsustained AV node reentry tachycardia, initiation programmed      atrial stimulation on isoproterenol, duration was nonsustained,      termination was spontaneous.      a.     Mapping.  Mapping of Koch's triangle demonstrated very small       and anteriorly displaced Koch's triangle.      b.     RF energy application.  A total of 13 RF energy applications       were delivered to sites 4 through 6 in Koch's triangle.  During RF       energy application, there was accelerated junctional rhythm;       however, there is retrograde block to the atrium.   CONCLUSION:  This study demonstrates inducible nonsustained AV node  reentry tachycardia and apparent successful slow pathway modification.  Of note, the slow pathway area was very close to the compact AV node and  during RF energy application, there was retrograde block to the atrium   worrisome for ultimately developing  complete heart block.  For this reason, additional aggressive RF energy  applications were not carried out.  As a consequence, the patient did  have persistent echo beats and jumps after the procedure of uncertain  clinical significance.      Doylene Canning. Ladona Ridgel, MD  Electronically Signed     GWT/MEDQ  D:  12/02/2006  T:  12/03/2006  Job:  [id number]   cc:   Thomas C. Daleen Squibb, MD, [facility]  Stacie Glaze, MD

## 2010-11-03 NOTE — Assessment & Plan Note (Signed)
OFFICE VISIT   Christopher Baker, Christopher Baker  DOB:  October 12, 1933                                       01/03/2008  ZOXWR#:[id number]   The patient presents today for continued followup of his asymptomatic  carotid disease.  I had last seen him in 2006 and he had a subsequent  ultrasound carotid duplex in October of 2008.  He did have an episode of  supraventricular tachycardia and syncope, which was treated with  ablation therapy by Dr. Lewayne Bunting.  He specifically denies any focal  neurologic deficits.  He has had no amaurosis fugax, transient ischemic  attack, or stroke.  Does have a history of hypertension.  He does not  smoke, having quit 8 years ago.   PHYSICAL EXAM:  Well-developed, well-nourished white male appearing  younger than stated age of 55.  His blood pressure is 126/81, pulse 73,  respirations 18.  His temperature is 97.4.  His carotid bruits are  without bruits bilaterally.  He is grossly intact neurologically.  Radial pulses are 2+ bilaterally.   He underwent repeat carotid duplex in our office today and this reveals  no significant change.  He has moderate stenosis bilaterally in the 40-  59% range.  I discussed this at length with the patient.  I have  recommended that we see him on a yearly basis for followup of his  carotid stenosis.  I again reviewed focal neurologic deficits with him  and he will notify us should these occur.  Otherwise, we will see him  again in 1 year with carotid duplex.   Larina Earthly, M.D.  Electronically Signed   TFE/MEDQ  D:  01/03/2008  T:  01/04/2008  Job:  [id number]   cc:   Stacie Glaze, MD

## 2010-11-03 NOTE — Op Note (Signed)
NAMEBENJIMIN, HADDEN               ACCOUNT NO.:  [id number]   MEDICAL RECORD NO.:  [id number]          PATIENT TYPE:  INP   LOCATION:  5128                         FACILITY:  [facility]   PHYSICIAN:  Anselm Pancoast. Weatherly, M.D.DATE OF BIRTH:  1934/03/05   DATE OF PROCEDURE:  DATE OF DISCHARGE:                               OPERATIVE REPORT   PREOPERATIVE DIAGNOSIS:  Acute cholecystitis.   POSTOPERATIVE DIAGNOSIS:  Acute cholecystitis.   OPERATION:  Laparoscopic cholecystectomy with cholangiogram.   ANESTHESIA:  General.   SURGEON:  Iona Coach, MD.   ASSISTANT:  Nurse.   HISTORY:  Aleck Locklin is a 77 year old moderately obese male who was  admitted through the emergency room yesterday with a following history.  He has had a history of cardiac problems and yesterday or day before  yesterday after eating supper had the onset of severe pain in the early  a.m. presented to the emergency room.  They did a cardiac evaluation  where no abnormality was noted and obtained a CAT scan, and the CAT scan  showed a stone, sort of impacted in the neck of the gallbladder cystic  duct.  We were called to see him yesterday afternoon and admitted him.  His white count was mildly elevated initially, it is back to normal  today, and he was started on antibiotic.  This morning, he is not having  significant pain, but we added him to the OR schedule, and we started  him on Unasyn yesterday.  On examination, he has a little bulge at the  umbilicus, it is a little small umbilical hernia with fat, preperitoneal  when  kind of through the fascia, and I told him that I would try to  repair that simultaneously.  The patient was given the Unasyn.  He has  got PAS stocking positioned on the OR table and induction of general  anesthesia.  Just above the umbilicus in its farthest distal fatty  preperitoneal tissue was removed, I did put a little towel on the base  of it.  I then used a Tresa Endo to kind of go  into the peritoneal cavity and  put 2 lateral traction sutures.  The Hasson cannula was introduced and  then on inspection the gallbladder is definitely edematous and not sub-  acutely inflamed.  The upper 10-mm trocar was placed under direct vision  and then the 2 lateral 5-mm trocars, and then anesthetized the fascia  before their insertions.  The gallbladder was retracted up and it was  very adenomatous and we switched to the 30-degree scope for better  visualization.  He is moderately obese, kind of a thin abdomen, and the  exposure was always sort of difficult.  I dissected proximally on the  neck of the gallbladder area, identified the little cystic artery, which  was separated from the cystic duct, clipped, doubly, proximally, singly,  and distally and then I could encompass the cystic duct with a right  angle.  I put a clip across the junction of the gallbladder and then  little proximal just to the clip was opened and a  Cook catheter  introduced and held in place with the clip.  X-ray was obtained.  There  was good prompt filling on the x-ray at the biliary system.  I do not  think the cystic duct comes off the right hepatic, but I elected to clip  it very close to the gallbladder.  I think it was just the angle that we  positioned, but there was good flow into the duodenum, and the  intrahepatic radicles filled nicely.  The three little clips were placed  across the little cystic duct.  They were dilated enough that the clips  will just barely go across, but I was taking care to make sure I got the  clips completely across the cystic duct and elected to do this instead  of using the Endoloop.  Next, the posterior branch of the cystic artery  was identified and this was doubly clipped and then using the hook  electrocautery, I separated the gallbladder from its bed very cautiously  and carefully.  Good hemostasis was obtained.  The wound was irrigated.  We then looked back at the  cystic duct area.  There was no evidence of  any bile.  There were serial clips in place.  The gallbladder was  completely freed from the liver bed and placed in the  EndoCatch bag.  I  switched the hammer to the upper 10 mL port and then withdrew the bag  containing the gallbladder sort of opened the gallbladder and where we  thought was a fairly good size stone, they were actually several stones  in the gallbladder that was sort of impacted together in the neck of the  gallbladder that was visualized on the CT.  The irrigated fluid had been  aspirated.  There was no evidence of any bile, irrigation, everything  was clear, and then we closed the fascia at the umbilicus transversally  with interrupted sutures of 0-Surgilon for little hernia repair.  I did  only open the fascia about a cm in size totally.  Next, the 5-mm port  was withdrawn under direct vision after all the irrigated fluid had been  removed, and then I put also a fascial suture in the upper midline.  He  has got a very thin fascia and it was kind of a protuberant abdomen, and  I closed this with 2-0 Surgilon of single figure-of-eight suture.  The  subcutaneous veins were closed with 4-0 Vicryl, Benzoin, and Steri-Strep  on the skin.  The patient tolerated the procedure nicely and hopefully  he will be ready for discharge tomorrow.  I had instructed he and his  wife, that he is to really should not play golf at all with this little  fascial defect, or I will he think he can get a reoccurrence of the  small umbilical hernia.  He hopefully follow our instructions and not do  any strenuous activity for approximately 4 weeks.           ______________________________  Anselm Pancoast. Zachery Dakins, M.D.     WJW/MEDQ  D:  02/01/2008  T:  02/02/2008  Job:  [id number]

## 2010-11-03 NOTE — Discharge Summary (Signed)
Christopher Baker, Christopher Baker               ACCOUNT NO.:  [id number]   MEDICAL RECORD NO.:  [id number]          PATIENT TYPE:  OIB   LOCATION:  6532                         FACILITY:  [facility]   PHYSICIAN:  Gerrit Friends. 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 Ladona Ridgel.   PROCEDURES PERFORMED:  EP study and ablation by Dr. Ladona Ridgel on December 02, 2006.   PRIMARY CARE PHYSICIAN:  Dr. Lovell Sheehan.   SUMMARY OF HISTORY:  Christopher Baker is a 77 year old male who is referred  to Dr. Ladona Ridgel on December 01, 2006 for tachy palpitations that had been  present for the last six months.  These are associated with dizziness  and lightheadedness.  He has not had any frank syncope.  CT had been  performed and showed small vessel disease, small microhemorrhage in the  left temporal lobe thought secondary to hypertension.  Dr. Ladona Ridgel felt  that he should have an EP evaluation so this is arranged as an  outpatient.   LABORATORY DATA:  Admission H&H 12.5 and 36.9, normal indices, platelets  209, WBC 6.1, PTT 24, PT 13.6.  Sodium 145, potassium 3.8, BUN 23,  creatinine 1, glucose 104.   EKG on the 14th showed sinus bradycardia with a rate of 55, first degree  AV block, PR interval 314.   HOSPITAL COURSE:  Dr. Ladona Ridgel performed EP study with inducible AVN RT  with a very small [place] triangle.  He underwent ablation with successful  slow PW modification.  Postprocedure catheterization site was intact,  and he was ambulating without difficulty.  It was felt that he could be  discharged home.  Dr. Ladona Ridgel did note that an additional ablation would  ideally have been performed, but slow pathway impacted AV note in close  proximity.  On the 14th  Dr. Dietrich Pates felt he was fine and stable for discharge.   DISPOSITION:  He is asked to  maintain low sodium heart-healthy diet.  Wound care and activity are per supplemental sheet.   MEDICATIONS:  His medications remain the same as they were on admission.  These include:  1. Nabumetone 750 mg two tablets q.a.m.  2. Requip q.h.s.  3. Cymbalta 30 mg q.h.s.  4. Prednisone 5 mg daily.  5. Vytorin 10/40 daily.  6. DynaCirc 5 mg daily.  7. Aspirin 81 daily.  8. Zyrtec 10 mg daily.  9. Stool softener as needed.  10.Benicar HCT 40/12.5 mg daily.  11.Enbrel shot every Saturday.   He was asked to bring all medications to all appointments.  He was asked  to call our office on Monday to arrange a follow-up appointment with Dr.  Ladona Ridgel.      Joellyn Rued, PA-C      Gerrit Friends. Dietrich Pates, MD, [facility]  Electronically Signed    EW/MEDQ  D:  12/03/2006  T:  12/03/2006  Job:  [id number]   cc:   Thomas C. Daleen Squibb, MD, Lelon Perla, M.D.

## 2010-11-03 NOTE — Assessment & Plan Note (Signed)
[facility]                         ELECTROPHYSIOLOGY OFFICE NOTE   NAME:Cartwright, DENYS SALINGER                      MRN:          [id number]  DATE:06/24/2008                            DOB:          1933-11-15    [name] returns today for followup.  He is a very pleasant 77-year-  old male with a history of symptomatic SVT status post ablation who  developed recurrent tachy palpitations several months ago while he was  playing golf.  He checked his heart rate and was thought it was about  100, but it was sustained and elevated.  Eventually it went back to  being normal and has not had any recurrence.  He wonders if this was  dehydration.  Otherwise, he had no specific complaints today.  He  continues to be quite active, exercising, and working out several times  per week.   His current medications include Relafen, ReQuip, prednisone, Vytorin  10/40, Benicar daily, amlodipine 5 a day, aspirin 81 a day.   PHYSICAL EXAMINATION:  GENERAL:  He is a pleasant, well-appearing man in  no distress.  VITAL SIGNS:  Blood pressure was 152/78, pulse 68 and regular,  respirations were 18, the weight was 245 pounds.  NECK:  No jugular venous distention.  LUNGS:  Clear bilaterally to auscultation.  No wheezes, rales, or  rhonchi are present.  No increased work of breathing.  CARDIOVASCULAR:  Regular rate and rhythm.  Normal S1 and S2.  EXTREMITIES:  No edema.  ABDOMEN:  Soft, nontender.   Review of the patient's cardiac monitor demonstrates sinus rhythm,  mostly with one episode of sinus tachycardia.   IMPRESSION:  Rare palpitations following catheter ablation of  supraventricular tachycardia.   DISCUSSION:  Overall, Mr. Demello is stable.  I have given him a  prescription for low-dose beta-blocker today to take if needed for  symptomatic recurrent palpitations.  If he has these then I would like  to see him back, otherwise we will plan a period of watchful  waiting.     Doylene Canning. Ladona Ridgel, MD  Electronically Signed    GWT/MedQ  DD: 06/24/2008  DT: 06/25/2008  Job #: [id number]

## 2010-11-03 NOTE — Assessment & Plan Note (Signed)
OFFICE VISIT   Christopher Baker, Christopher Baker  DOB:  [birth year]                                       03/03/2010  JYNWG#:[id number]   The patient presents today for continued followup of his asymptomatic  extracranial cerebrovascular occlusive disease.  He reports since our  last visit with him he has had an episode of atrial fibrillation and is  wearing a cardiac monitor currently.  He otherwise reports no change in  his past history.  He is not a diabetic.  He does have hypertension.  He  does not smoke cigarettes.  He specifically denies any amaurosis fugax,  transient ischemic attack or stroke.  He does have some orthostatic  hypotension type changes and reports that when he bends over to pick up  a golf ball and rises up quickly he can have some transient dizziness.   PHYSICAL EXAM:  General:  A well-developed, well-nourished white male  appearing stated age of 50.  Vital signs:  His blood pressure is 133/71,  heart rate 64, respirations 16.  He is no acute distress.  HEENT:  Normal.  Carotid arteries are without bruits bilaterally.  Chest:  Clear  bilaterally without rales, rhonchi or wheezes.  Heart:  Regular rate and  rhythm without murmur.  He has 2+ radial pulses.  Neurologic:  No focal  weakness, paresthesias.  Skin:  Without ulcers or rashes.   I reviewed his carotid duplex with him from today.  This shows no change  in the study from 6 months ago.  He does have 60%-79% left and 40%-59%  right carotid stenosis.  I again discussed symptoms of carotid disease  with the patient and he will notify us should this occur otherwise we  will see him at 6 month intervals in our noninvasive vascular lab.     Larina Earthly, M.D.  Electronically Signed   TFE/MEDQ  D:  03/03/2010  T:  03/04/2010  Job:  [id number]   cc:   Stacie Glaze, MD  Doylene Canning. Ladona Ridgel, MD

## 2010-11-03 NOTE — Discharge Summary (Signed)
NAMEOSMAN, CALZADILLA               ACCOUNT NO.:  [id number]   MEDICAL RECORD NO.:  [id number]          PATIENT TYPE:  INP   LOCATION:  3735                         FACILITY:  [facility]   PHYSICIAN:  Jonelle Sidle, MD DATE OF BIRTH:  Jul 22, 1933   DATE OF ADMISSION:  12/07/2006  DATE OF DISCHARGE:  12/08/2006                               DISCHARGE SUMMARY   ALLERGIES:  NO KNOWN DRUG ALLERGIES   Greater than 35 minutes for this dictation.   FINAL DIAGNOSIS:  Presyncope.   SECONDARY DIAGNOSES:  1. History of AVN RT status post recent electrophysiology study,      radiofrequency catheter ablation December 02, 2006 (this patient SVT      was unsustainable during the procedure and radiofrequency catheter      ablation was delivered to a very small Koch's triangle by Dr.      Ladona Ridgel.  2. Myoview study in the past is negative.  3. Echocardiogram November 21, 2006 ejection fraction 65%, no left      ventricular wall motion abnormalities.  4. Hypertension.  5. Dyslipidemia.  6. Psoriatic arthritis.  7. Restless leg syndrome  8. History of prostate cancer with Lupron treatment.   PROCEDURES:  No procedures this admission   BRIEF HISTORY:  Mr. Dambach is a 77 year old male who was admitted June  18 with presyncope.  This occurred in the setting of playing golf.  He  got very dizzy.  He did not have rapid heartbeats as he has had a the  past when he got dizzy, but irregular heartbeats.  He felt his carotid  artery and his heart beats were irregular.  This happened at the onset  of a golf game and were intermittent throughout 18 holes of golf.  Did  have some episodes of dizziness which caused the patient to stop and  rest for fear of having actual syncope.  The patient presented to the  emergency room.  He had one troponin I study.  It was 0.03.  Electrocardiogram was nondiagnostic for acute myocardial infarction.  The patient has had no recurrence of atrial dysrhythmias this  hospitalization.  He was seen on the morning of June 19 by Dr. Graciela Husbands and  suggested that the patient have a atrial fibrillation auto detect  monitor to wear. This he will pick up from the office on his way home  today.   The patient is discharging on his home medications which include:  1. Cymbalta 30 mg daily.  2. DynaCirc 5 mg daily.  3. Requip 1 mg at bedtime  4. Benicar/HCT 40/12.5 daily  5. Vytorin 10/40 daily at bedtime.  6. Prednisone 5 mg daily.  7. Enteric-coated aspirin 81 mg daily  8. Nabumetone 750 mg 2 tablets in the morning  9. Enbrel 50 mg weekly.   He is to stop by [facility] on [address] for the  tech monitor after 2:00 p.m. today.  They will advise as to the  operation of the device.   LABORATORY STUDIES THIS ADMISSION:  Alkaline phosphatase 101, SGOT is  25, SGPT 21 and  TSH is pending at the time of discharge.  Complete blood  count, white cells 6.4, hemoglobin is 13.2, hematocrit 38.6, platelets  205.  Sodium 137, potassium 3.9, chloride 104, carbonate 27, BUN is 23,  creatinine 1.14, platelets 110.      [name], [name]      Jonelle Sidle, MD  Electronically Signed    GM/MEDQ  D:  12/08/2006  T:  12/08/2006  Job:  [id number]   cc:   Doylene Canning. Ladona Ridgel, MD  Stacie Glaze, MD

## 2010-11-03 NOTE — Assessment & Plan Note (Signed)
[facility]                                 ON-CALL NOTE   NAME:Christopher Baker, Christopher Baker                        MRN:          [id number]  DATE:12/07/2006                            DOB:          01/19/1934    I received 2 pages through the answering service on June 18 at  approximately 1826 and 1849 from [name] and his wife [name]. Patient  with complaints of presyncopal episode and palpitations occurring on the  date of call. Patient stating symptoms felt like prior to his ablation  he had just had done 6 days prior, but at time of call the patient  denied any palpitations, chest discomfort, presyncope or syncope, stated  symptoms had almost completely resolved. He did not want to come to the  emergency room at that time for further evaluation. I then spoke with  his wife 20 minutes later. She had spoken with her husband regarding the  episode and she felt that he was still having some dizziness. She felt  that he was pale and more weak than he should be. I told her that short  of coming to the emergency room to get further evaluation, which he had  already declined previously, there was not much I could do, but I felt  that he needed to be examined and this would include coming to the  emergency room to at least have a 12-lead EKG done for initial  evaluation. She stated that she would bring her husband in and have him  evaluated.      Dorian Pod, ACNP  Electronically Signed      Jonelle Sidle, MD  Electronically Signed   MB/MedQ  DD: 12/12/2006  DT: 12/13/2006  Job #: [id number]

## 2010-11-03 NOTE — Procedures (Signed)
CAROTID DUPLEX EXAM   INDICATION:  Follow up carotid artery disease.   HISTORY:  Diabetes:  No.  Cardiac:  Atrial fibrillation.  Hypertension:  Yes.  Smoking:  No.  Previous Surgery:  No.  CV History:  Asymptomatic.  Amaurosis Fugax , Paresthesias , Hemiparesis                                       RIGHT             LEFT  Brachial systolic pressure:         132               138  Brachial Doppler waveforms:         Within normal limits                Within normal limits  Vertebral direction of flow:        Antegrade         Antegrade  DUPLEX VELOCITIES (cm/sec)  CCA peak systolic                   113               87  ECA peak systolic                   168               188  ICA peak systolic                   194               222  ICA end diastolic                   54                63  PLAQUE MORPHOLOGY:                  Heterogenous      Heterogenous  PLAQUE AMOUNT:                      Moderate          Moderate  PLAQUE LOCATION:                    CCA, ICA, ECA     ICA, ECA   IMPRESSION:  1. Right internal carotid artery velocities indicate 40%  to 59%      stenosis.  2. Left internal carotid artery velocities indicate a 60% to 79%      stenosis.   ___________________________________________  Larina Earthly, M.D.   EM/MEDQ  D:  03/03/2010  T:  03/03/2010  Job:  [id number]

## 2010-11-03 NOTE — Discharge Summary (Signed)
NAMEMARLOS, Christopher Baker               ACCOUNT NO.:  [id number]   MEDICAL RECORD NO.:  [id number]          PATIENT TYPE:  INP   LOCATION:  3735                         FACILITY:  [facility]   PHYSICIAN:  Duke Salvia, MD, FACCDATE OF BIRTH:  August 06, 1933   DATE OF ADMISSION:  12/07/2006  DATE OF DISCHARGE:  12/08/2006                               DISCHARGE SUMMARY   OBSERVATION OF STATUS:  Greater than 35 minutes for this dictation.   This patient has NO KNOWN DRUG ALLERGIES.   Dictation ended at this point.      [name], [name]      Duke Salvia, MD, [facility]  Electronically Signed    GM/MEDQ  D:  12/08/2006  T:  12/08/2006  Job:  [id number]   cc:   Doylene Canning. Ladona Ridgel, MD

## 2010-11-03 NOTE — H&P (Signed)
NAMEORLANDIS, SANDEN               ACCOUNT NO.:  [id number]   MEDICAL RECORD NO.:  [id number]          PATIENT TYPE:  INP   LOCATION:  3735                         FACILITY:  [facility]   PHYSICIAN:  Jonelle Sidle, MD DATE OF BIRTH:  1934/06/20   DATE OF ADMISSION:  12/07/2006  DATE OF DISCHARGE:                              HISTORY & PHYSICAL   PRIMARY CARDIOLOGIST:  Maisie Fus C. Wall, MD, [facility]   AP PHYSICIAN:  Carolanne Grumbling, M.D.   PRIMARY CARE PHYSICIAN:  Stacie Glaze, M.D.   HISTORY:  Mr. Delpizzo is a very pleasant 77 year old Caucasian gentleman  with known history of palpitations status post EP study with  radiofrequency catheter ablation of node reentry tachycardia on December 02, 2006, by Dr. Carolanne Grumbling.  The patient did well from a procedure  prospective, was stable, and discharged home on the following day.  States he has done well since he was discharged home.  Today being  approximately 6 days later, the patient attempted to play 18 holes of  gold.  Apparently he reports not long after he began playing he  experienced some palpitations, the same type of symptoms as prior to his  ablation.  However, he noted this time his heart rate appeared to be  fast and irregular, where as it has not been irregular in the past.  He  experienced the same symptoms of presyncope to the point at one time he  thought he was going to pass out, he sat down on the golf course for  symptoms in the past.  He returned home.  His wife noted that he did not  appear to feel well.  She called our office, I spoke with her by phone.  She noted and palpating patient's carotid a pulse that it was irregular  at times.  I asked him to come to the emergency room for further  evaluation.  Mr. Torbeck currently denies any discomfort.  He states he  has occasional flutter sensation in his chest, but he has had this for  years.  This was different from previous symptoms.  During recent  cardiac workup, the  patient underwent a stress Myoview that was negative  for ischemia with a normal EF and a 2D echocardiogram that was  reportedly normal.   PAST MEDICAL HISTORY:  Includes:  1. Syncope x3 episodes prior to his ablation.  2. Hypertension.  3. Dyslipidemia.  4. Psoriatic arthritis.  5. Restless leg syndrome.  6. Prostate cancer with Lupron treatment.   ALLERGIES:  NO KNOWN ALLERGIES.   MEDICATIONS:  Currently include:  1. Requip nightly.  2. Cymbalta 30.  3. Prednisone 5.  4. Vytorin 10/40.  5. Aspirin 81.  6. DynaCirc 5.  7. [place] shot weekly.  8. Benicar HCT 40/12.5.  9. Relisorm 750 mg 2 tablets q.a.m.   SOCIAL HISTORY:  He lives in [place] with his wife.  He is a semi-  retired Surveyor, minerals.  He had 3 adult children.  Denies any tobacco use.  No diet restrictions.  Has an occasional drink at social events.  Denies  any  drug or herbal medication use.  He enjoys playing golf.   FAMILY HISTORY:  Mother questionable stroke.  Father died secondary to  motor vehicle accident.  The patient has no siblings.   REVIEW OF SYSTEMS:  Positive for constipation, presyncope, dizzy,  weakness, post palpitations.  The patient states he feels good  otherwise.   PHYSICAL EXAMINATION:  VITAL SIGNS:  The patient is afebrile, pulse 63,  respirations 18, blood pressure 127/61, sat 99% on room air.  GENERAL:  He is in no acute distress.  HEENT:  Normocephalic, atraumatic. Pupils equal, round, reactive to  light.  Sclerae is clear.  Mucous membranes are moist.  NECK:  Supple without lymphadenopathy.  No bruits, no JVD.  CARDIOVASCULAR:  Reveals an S1, S2.  Occasional irregular beat.  Otherwise no murmurs, rubs, or gallop.  LUNGS:  Clear to auscultation bilaterally.  SKIN:  Warm and dry.  ABDOMEN:  Soft, nontender, positive bowel sounds.  EXTREMITIES:  Without clubbing, cyanosis, or edema.  NEUROLOGIC:  The patient is alert and oriented x3.   IMAGING:  Chest x-ray is pending.  EKG sinus  rhythm, occasional  premature ventricular contraction.  First degree AV block with a PR 267  at a rate of 63.   LABORATORY DATA:  Pending.   HOSPITAL COURSE:  Dr. Jonelle Sidle went to examine and assess the  patient with episode of presyncope, setting of palpitations, possible  questionable PAS.  The patient will be admitted to telemetry for 24 hour  observation.  We will have Dr. Graciela Husbands see the patient in the a.m. for  further evaluation.  Have the patient followup with Dr. Daleen Squibb and with  Dr. Ladona Ridgel.  May need outpatient cardiac monitor.      Dorian Pod, ACNP      Jonelle Sidle, MD  Electronically Signed    MB/MEDQ  D:  12/07/2006  T:  12/08/2006  Job:  [id number]

## 2010-11-03 NOTE — Assessment & Plan Note (Signed)
[facility]                                 ON-CALL NOTE   NAME:HANCOCKJaskirat, Schwieger                        MRN:          [id number]  DATE:11/27/2006                            DOB:          Jul 08, 1933    PRIMARY CARDIOLOGIST:  Valera Castle, M.D.   ON CALL PROGRESS NOTE:  This is a telephone conversation, initially with  PDS systems, and then with Ancil Boozer. I received a page from the PDS  system regarding change in heart rhythm on [facility], who is wearing  a heart monitor. He had a new onset of atrial flutter, rate 158 to 168,  that lasted approximately 11 seconds this morning. It then was followed  by a 10 second run, same heart rate, and then a 1 minute episode of  atrial flutter at a heart rate of 130 to 150 per PDS interpretation. I  called Mr. Niemczyk at home at [phone] and spoke with him directly. He  denied any episodes of pre-syncope or syncopal episodes this morning.  Complains of palpitations intermittently but no strenuous degree today.  He did have 1 episode of mild light headedness and did transmit this  morning. States he feels fine now. Note, in talking with PDS, the  patient's heart rhythm had returned to normal sinus rhythm. I instructed  Mr. Gelles to transmit if he felt any further episodes of light  headedness, dizziness, or extreme palpitations, heart racing, or if he  experienced any further episodes of syncope to call the answering  service and have me paged. Otherwise, followup with Dr. Daleen Squibb.     Dorian Pod, ACNP  Electronically Signed    MB/MedQ  DD: 11/27/2006  DT: 11/27/2006  Job #: [id number]   cc:   Thomas C. Daleen Squibb, MD, [facility]  Peter C. Eden Emms, MD, [facility]

## 2010-11-03 NOTE — Procedures (Signed)
CAROTID DUPLEX EXAM   INDICATION:  Followup of carotid disease.   HISTORY:  Diabetes:  No.  Cardiac:  No.  Hypertension:  Yes.  Smoking:  Previous.  Previous Surgery:  No.  CV History:  Asymptomatic.  Amaurosis Fugax Yes No, Paresthesias Yes No, Hemiparesis Yes No                                       RIGHT             LEFT  Brachial systolic pressure:         140               139  Brachial Doppler waveforms:         Triphasic         Triphasic  Vertebral direction of flow:        Antegrade         Antegrade  DUPLEX VELOCITIES (cm/sec)  CCA peak systolic                   87                80  ECA peak systolic                   200               250  ICA peak systolic                   205               216  ICA end diastolic                   49                47  PLAQUE MORPHOLOGY:                  Mixed             Mixed  PLAQUE AMOUNT:                      Moderate to severe                  Moderate to severe  PLAQUE LOCATION:                    ICA               ICA   IMPRESSION:  60%-79% stenosis noted in bilateral internal carotid  arteries.   ___________________________________________  Larina Earthly, M.D.   CJ/MEDQ  D:  07/18/2009  T:  07/18/2009  Job:  [id number]

## 2010-11-03 NOTE — Letter (Signed)
December 08, 2006     RE:  Christopher Baker, Christopher Baker  MRN:  [id number]  /  DOB:  [birth year]   To Whom it May Concern:   [name] was recently submitted to our catheter ablation for  supraventricular tachycardia.  The patient post procedurally had an  irregular palpitation syndrome associated with presyncope.  This might  represent atrial fibrillation or some other more malignant rhythm.  We  request the use of an auto detection event recorder to try and help [place]  clarify what the mechanism of this potentially serious arrhythmia is.   Thank you in advance for your consideration.    Sincerely,      Duke Salvia, MD, [facility]  Electronically Signed    SCK/MedQ  DD: 12/08/2006  DT: 12/08/2006  Job #: [id number]

## 2010-11-03 NOTE — Assessment & Plan Note (Signed)
[facility]                         ELECTROPHYSIOLOGY OFFICE NOTE   NAME:Christopher Baker, Christopher Baker                      MRN:          [id number]  DATE:12/26/2006                            DOB:          02/11/1934    Christopher Baker returns today for followup visit.  This is a pleasant, 77-  year-old male with the history of documented SVT who underwent  electrophysiology study and catheter ablation back in June.  Since then,  he has had no sustained heart racing sensations, but does have periods  where he has very irregular heart beats associated with dizziness.  He  notes that these are especially bad when he bends over and stands back  up and he gets severely dizzy particularly when he is out on the golf  course.  He denies chest pain and shortness of breath.  He denies  peripheral edema.   PHYSICAL EXAMINATION:  GENERAL:  He is a pleasant, well-appearing man in  no distress.  VITAL SIGNS:  Blood pressure today was 130/80, pulse 60 and regular,  respirations 18, weight 232 pounds.  NECK:  No jugular venous distention.  LUNGS:  Clear to auscultation bilaterally.  No wheezes, rales or rhonchi  are present.  CARDIAC:  Regular rate and rhythm with normal S1, S2.  No murmurs, rubs  or gallops appreciated.  EXTREMITIES:  Trace peripheral edema bilaterally.   EKG today demonstrates sinus rhythm with PVCs.   IMPRESSION:  1. Symptomatic tachycardiac palpitations.  2. Supraventricular tachycardia.  3. Status post ablation.  4. Recurrent palpitations.   DISCUSSION:  Overall, Christopher Baker is stable, but does have symptomatic  palpitations.  For now, I have recommended a period of watchful waiting  rather than additional medical therapy for this.  If his palpitations  increase in frequency and severity, then we would consider  antiarrhythmic drug therapy, particularly if he has documented PVCs as  the cause or perhaps nonsustained VT.  We will plan to see him back  in  the office in approximately 3-4 months and he will see Dr. Valera Castle  back in 1 month.     Doylene Canning. Ladona Ridgel, MD  Electronically Signed    GWT/MedQ  DD: 12/26/2006  DT: 12/27/2006  Job #: [id number]   cc:   Stacie Glaze, MD

## 2010-11-03 NOTE — H&P (Signed)
Christopher Baker, Baker               ACCOUNT NO.:  [id number]   MEDICAL RECORD NO.:  [id number]          PATIENT TYPE:  INP   LOCATION:  5128                         FACILITY:  [facility]   PHYSICIAN:  Anselm Pancoast. Weatherly, M.D.DATE OF BIRTH:  08-Apr-1934   DATE OF ADMISSION:  01/31/2008  DATE OF DISCHARGE:                              HISTORY & PHYSICAL   ADMITTING PHYSICIAN:  Anselm Pancoast. Zachery Dakins, MD   PRIMARY CARE PHYSICIAN:  Stacie Glaze, MD   CHIEF COMPLAINT:  Right-sided abdominal pain.   HISTORY OF PRESENT ILLNESS:  Christopher Baker Baker a 74-year male patient who has  medical problems related to psoriatic arthritis, on chronic low-dose  prednisone; restless leg syndrome; hypertension; and dyslipidemia.  He  reports about 1 month ago, he had an episode of right-sided abdominal  pain that began after eating steak that episode was severe enough that  the patient could recall it lasted several hours and the next day, he  had mild residual nausea, but had not had any other symptoms since that  time until last night.  He went out with his wife and ate a tiny food  and had a heavy [employer] ice cream, and was awakened subsequently at  4:30 this morning with progressive and unrelenting right-sided abdominal  pain.  He called his primary care physician who recommended that he  present to the ER.  In the ER, he was still in significant pain.  The  pain had resolved after doses of IV narcotics and antiemetic medication.  His LFTs were normal, but he had a mild leukocytosis with a left shift.  Because in the ER, he was having a kind of a diffuse right-sided pain,  not specifically in the right upper quadrant, CT of the abdomen and  pelvis was the initial diagnostic study done.  This did demonstrate a  stone in the cystic duct and otherwise findings consistent with acute  cholecystitis.  The ER physician, Dr. Radford Pax has asked [place] to evaluate  the patient for admission.   PAST MEDICAL HISTORY:  1.  Psoriatic arthritis, on steroids.  2. Restless legs syndrome.  3. Dyslipidemia.  4. Hypertension.   PAST SURGICAL HISTORY:  1. Inguinal hernia repair x2.  The first was multiple years ago when      the patient was in the [employer].  The second was several      years ago, per Dr. Luretha Murphy.  2. Knee surgeries on the right leg.  3. Penile prosthesis placement secondary to Peyronie disease.   FAMILY HISTORY:  Noncontributory.   SOCIAL HISTORY:  No tobacco.  He is married.  Social alcohol only.   DRUG ALLERGIES:  NKDA.   CURRENT MEDICATIONS:  ReQuip, Relafen, amlodipine, Benicar, aspirin,  Vytorin, prednisone, nabumetone, Zyrtec, Vicodin, and Voltaren.  The  patient does not recall dosages and wife will bring in pill bottles this  evening to complete the medication reconciliation sheet.   REVIEW OF SYSTEMS:  As per the history present illness.  Otherwise, all  review of systems categories are noncontributory.   PHYSICAL EXAMINATION:  GENERAL:  Pleasant  male patient currently  reporting improvement in symptoms as prior to presentation to the ER.  VITAL SIGNS:  Temperature 97.5, BP 130/69, pulse 79 and regular, and  respirations 20.  EYES:  Nonicteric.  Conjunctivae are pink.  No drainage.  Pupils are  equal and react to light.  EARS, NOSE AND THROAT:  Ears are symmetrical.  No otorrhea.  Nose is  midline.  No rhinorrhea.  Oral mucous membranes are pink and moist.  NECK:  Thyroid is down and nonpalpable.  Trachea is midline.  CHEST:  Bilateral lung sounds are clear to auscultation.  Respiratory  effort is nonlabored.  He is sating 98% on room air.  CARDIOVASCULAR:  Heart sounds are S1 and S2 without rubs, murmurs,  thrills, or gallops.  No JVD.  Pulses regular, not tachycardic.  No  peripheral edema.  Pulses are palpable 2+ bilaterally, carotid, radial,  and pedal.  ABDOMEN:  Soft, currently nontender.  Bowel sounds are present.  No  appreciable hepatosplenomegaly.  He  has a soft nonreducible and  nontender umbilical hernia.  EXTREMITIES:  Symmetrical in appearance.  No edema or cyanosis.  SKIN:  He does have a psoriatic patch over the right knee.  Otherwise,  skin is unremarkable.  NEUROLOGIC:  Cranial nerves II through XII are grossly intact.  The  patient is ambulatory to the ER.  DTRs and Babinski reflexes have not  been checked.  Sensation is intact in the upper and lower extremities  bilaterally.  Strength is 5/5 bilaterally.  PSYCH:  The patient is oriented to time, person, place, and situation.  His affect is appropriate to current situation.   LABORATORY DATA AND X-RAY:  White count 11,300, neutrophils 85%,  hemoglobin 14, and platelets 203,000.  Sodium 141, potassium 3.9, CO2 of  25, glucose 138, BUN 20, and creatinine 1.25.  LFTs are normal.  Lipase  is 20.  CT of the abdomen and pelvis again reveals cholecystitis with  cystic duct stone, otherwise unremarkable CT   IMPRESSION:  1. Biliary colic secondary to cystic duct stone.  2. Acute cholecystitis.  3. Psoriatic arthritis, on chronic low-dose prednisone.  4. Restless legs syndrome.  5. Hypertension.  6. Dyslipidemia.   PLAN:  1. Admit to general surgical floor 3100 unit.  2. Clear liquid diet, low-fat full liquids if no symptoms, otherwise      NPO after midnight for planned surgical intervention in the      morning.  3. Empiric antibiotic therapy with Unasyn.  4. Symptomatic treatment with IV and oral pain medications.  5. Renew the patient's home medications once the medication      reconciliation sheet completed after family brings the medication      dosages from home.  6. Risk and benefits of the surgical procedure had been discussed with      the patient and his wife per Dr. Zachery Dakins and they wish to      proceed.  Plan on laparoscopic cholecystectomy with intraoperative      cholangiogram, possible open cholecystectomy in the morning since      the patient is currently  asymptomatic.      Allison L. Rennis Harding, N.P.    ______________________________  Anselm Pancoast. Zachery Dakins, M.D.    ALE/MEDQ  D:  01/31/2008  T:  02/01/2008  Job:  [id number]   cc:   Stacie Glaze, MD

## 2010-11-06 NOTE — Op Note (Signed)
NAMEMANVIR, PRABHU               ACCOUNT NO.:  [id number]   MEDICAL RECORD NO.:  [id number]          PATIENT TYPE:  AMB   LOCATION:  [facility]                         [facility]ACILITY:  [facility]   PHYSICIAN:  Jamison Neighbor, M.D.  DATE OF BIRTH:  Aug 26, 1933   DATE OF PROCEDURE:  08/03/2005  DATE OF DISCHARGE:                                 OPERATIVE REPORT   PREOPERATIVE DIAGNOSIS:  Elevated prostate specific antigen.   POSTOPERATIVE DIAGNOSIS:  Elevated prostate specific antigen.   PROCEDURE:  Saturation biopsy.   SURGEON:  Jamison Neighbor, M.D.   ANESTHESIA:  General.   COMPLICATIONS:  None.   DRAINS:  None.   BRIEF HISTORY:  This 77 year old male has a complicated urologic history.  The patient has had problems with an elevated PSA and has undergone  biopsies, which have come back negative.  He also had a TURP done for the  purpose of relief of bladder outlet obstruction that showed no evidence of  any atypical or malignancy.  The patient has been voiding beautifully since  he had the TURP, but there has been a continual increase in his PSA, which  when last checked came back at 17.45.  The patient is now to undergo  saturation biopsy.  He understands the risks and benefits of the procedure  and gave full informed consent.  We have specifically discussed with him the  possibility that this might effect his implant, either by damaging the  implant, which is highly unlikely, or by causing problems with infection.  He is going to be given preop and postop antibiotics, and they will be much  broader than normal following prostate biopsy .  He has received an  injection of Rocephin plus gentamicin and will be sent home on quinolone  antibiotics for a week.  He gave full, informed consent.   PROCEDURE:  After successful induction of general anesthesia, the patient  was initially placed in a dorsal lithotomy position.  Ultrasound made it  very difficult to see the prostate, so we switched  to a lateral position.  In that position, the prostate was very well visualized.  The patient has a  little bit of lateral lobe tissue left, particularly towards the apex.  He  has been  well resected through the middle.  No clear-cut hypoechoic areas  could be seen.  The seminal vesicles were unremarkable, and the patient's  prostatic outline appeared unremarkable.  The patient underwent biopsy using  a saturation technique.  Between 12 and 14 biopsies were taken on each side  to completely map the prostate, with great care taken to insure that the  very apical tissue as well as the bladder neck area, were  carefully covered.  The biopsy went all the way out to the lateral edge.  The patient tolerated this without any difficulty.  He will be sent home  with Levaquin as well as Lorcet Plus.  We did cycle his prosthesis, and it  appears to be functional.  We will have him back in followup in one week.  ______________________________  Jamison Neighbor, M.D.  Electronically Signed     RJE/MEDQ  D:  08/03/2005  T:  08/03/2005  Job:  [id number]

## 2010-11-06 NOTE — Op Note (Signed)
NAMENICOLI, NARDOZZI                         ACCOUNT NO.:  [id number]   MEDICAL RECORD NO.:  [id number]                   PATIENT TYPE:  OBV   LOCATION:  0357                                 FACILITY:  [facility]   PHYSICIAN:  Jamison Neighbor, M.D.               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   PROCEDURE:  Removal of AMS implant, left corporoplasty and placement of  Mentor Titan inflatable penile prosthesis.   SURGEON:  Jamison Neighbor, M.D.   ASSISTANT:  Susanne Borders, MD   ANESTHESIA:  General.   COMPLICATIONS:  None.   DRAINS:  Foley catheter.   BRIEF HISTORY:  This 77 year old male had a penile prosthesis placed a  number of years ago by Dr. Patsi Sears. The patient had known Peyronie's  disease and always had some problems with weakness of the corporal body on  the left hand side. The patient had developed an aneurysm that really was  quite bothersome to him. It caused bending of the penis and a definite lump  in that area. The patient finally decided to have this repaired because he  was very unhappy with the lump and not happy with the quality of erection.  This was being done simultaneously with a herniorrhaphy by Molli Hazard B.  Daphine Deutscher, M.D.  The patient understands the risks and benefits of the  procedure and gave informed consent.   DESCRIPTION OF PROCEDURE:  After successful induction of general anesthesia,  the patient was placed in the supine position, prepped with Betadine and  draped in the usual sterile fashion.  A routine hernia with mesh was  performed by Molli Hazard B. Daphine Deutscher, M.D. after he completed the procedure.  The  penile prosthesis surgery proceed.   The  patient's previous infrapubic incision was opened and the tubes to the  AMS device were identified. These were traced back to the corporal bodies on  both sides, they were also tracked back to the pump itself and to the  reservoir. The reservoir was removed, the pump was removed and after the  corporotomies had been opened each corpora was removed.  On the right hand  dissection, the corporal body appeared to be fine, on the left hand side it  was quite weak. For that reason, the corporal incision was extended with the  plan being made to repair the corporal body later on in the procedure. The  patient was resized and it appeared that over time the corporal bodies had  stretched. He now could take a 21 to 22 cm device. The Mentor device was  selected as __________ to have an  aneurysm problem.  The device was prepared  in the usual fashion, the 100 cm reservoir was placed into the old reservoir  space and was filled. The corporal tubes were then passed out through the  corporotomy in the usual fashion using a Furlow inserter. The 18 cm device  with 3 cm rear tip was extended.  Because of the __________ slight  discrepancy and turned out that the best option was the 4 cm rear tip on the  right side and 3 cm on the left.  The corporal body was very weak on that  side. The decision was made to repair the corporal body by plication and try  to tighten this area. The corporotomy was extended and the tissue was then  closed crossing over itself on a pants over vest approach. It appeared at  one point that the assistant may have touched the reservoir and while the  reservoir did not deflate, there was some concern that perhaps it was  damaged. The corporotomy was opened and it did turn out that that tube had  been injured.  The device was removed and replaced.  Preplaced sutures were  then tied down, the corporal repair was performed and it did appear that the  new corporoplasty was solid.  The  device was cycled several times, it was a  very straight erection with good filling out to the tip of the penis and a  nice straight firm result. The patient tolerated the procedure well and he  was taken to the recovery room in good condition after the wound had been  closed. The wound was closed with Vicryl and staples after irrigation with  antibiotic solution. The patient will maintain on 23 hour observation.                                               Jamison Neighbor, M.D.    RJE/MEDQ  D:  07/26/2003  T:  07/27/2003  Job:  [id number]

## 2010-11-06 NOTE — Op Note (Signed)
Christopher Baker, Christopher Baker                         ACCOUNT NO.:  [id number]   MEDICAL RECORD NO.:  [id number]                   PATIENT TYPE:  OBV   LOCATION:  0357                                 [facility]ACILITY:  [facility]   PHYSICIAN:  Jamison Neighbor, M.D.               DATE OF BIRTH:  Aug 09, 1933   DATE OF PROCEDURE:  07/12/2003  DATE OF DISCHARGE:  07/13/2003                                 OPERATIVE REPORT   PREOPERATIVE DIAGNOSIS:  Corporeal aneurysm of inflatable penile prosthesis.   POSTOPERATIVE DIAGNOSIS:  Corporeal aneurysm of inflatable penile  prosthesis.   OPERATION/PROCEDURE:  1. Replacement of inflatable penile prosthesis.  2. Repair of left corpora cavernosal defect.   SURGEON:  Jamison Neighbor, M.D.   ASSISTANT:  Susanne Borders, M.D.   ANESTHESIA:  General endotracheal anesthesia.   COMPLICATIONS:  None.   ESTIMATED BLOOD LOSS:  300 mL.   DISPOSITION:  To the post anesthesia care unit in stable condition.   INDICATIONS FOR PROCEDURE:  Christopher Baker is a gentleman with a history of AMS  inflatable penile prosthesis.  The patient has been complaining of kinking  that occurs during inflation of the prosthesis, namely on the left side.  On  physical examination, he was found to have probable aneurysmal defect of his  left cylinder.  He was told that this could be replaced with a new  prosthesis.  He has consented to this procedure after understanding the  risks, benefits and alternatives.   DESCRIPTION OF PROCEDURE:  The patient was brought to the operating room and  correctly identified by his identification bracelet.  He was given  preoperative antibiotics and general endotracheal anesthesia.  He was placed  in the supine position.  He was prepped and draped in the typical sterile  fashion.  Previous inflatable penile prosthesis had been placed through an  inferior pubic incision.   The scalpel was used to incise the scar for the old incision.  Bovie  electrocautery was  used to incise through Camper's and Scarpa's fascia, down  into the inferior pubic space.  There was a moderate amount of scar tissue  in this area which was dissected with right angle dissection.  The tubing to  the left cylinder was identified and was cleaned of the capsular tissue.  It  was traced down to the left corpora cavernosum.  The left corpora was  incised with the Bovie electrocautery and the left cylinder was removed into  the wound.  There was obvious aneurysmal dilation of this cylinder.  The  left corpora cavernosum was also examined and appeared to have aneurysmal  defect as well.  The lateral aspect of the corpora tissue was very flimsy.  Decision to replace the entire prosthesis was made at this point.  The  Mentor prosthesis was to be used.  The remaining tubing, right cylinder,  pump and reservoir were carefully removed using the Bovie  electrocautery to  dissect along the components.  After all of the device was removed, the  wound was copiously irrigated several times with antibiotic solution.  Another incision was made in the right corpora cavernosum to remove the  cylinder in this location.   Measurements were taken and the right and left both measured 21 cm.  Decision was made to use 18 cm rear-tip extender.  The [place]gina Evener tool was used  to load the [place] needle to the suture attached to both cylinders.  The  Virgina Evener tool was first placed in the right corpora cavernosum and the needle  was fired and removed in the mid glans without difficulty.  Prior to placing  the new cylinders, the dilating tools were used and the corpora was very  well dilated.  After the right cylinder was placed, the [facility] tool was used  to place the left cylinder in a similar manner.  After placement on this  side, it was noted that there would definitely need to be some repair work  done on the lateral aspect of the left corpora cavernosa that was basically  herniated at this location.  The  very flimsy amount of material that was  buttressing the left lateral aspect of the left corpora was incised and was  closed with deeper tissue that was more substantial.  The left cavernosum  now appeared capable of holding the inflatable cylinder.  After the left  cylinder was placed in a similar fashion, artificial erection was performed  by injecting approximately 60 mL of sterile saline through the tubing to the  cylinders.  The right cylinder fit very nicely.  However, the left cylinder  appeared to be slightly too long.  A 2 cm rear-tip extender was used instead  of a 3 cm one.  Again artificial erection was performed at this time.  There  was a very nice fit on both sides.  There was no disproportion of corporeal  lengths that was noted even though the right cylinder was 1 cm longer.  The  bilateral corpora cavernosa were then closed with 2-0 Vicryls in interrupted  fashion.  A new pump was placed in the right hemiscrotum in the most  dependent portion.  New reservoir was placed in the space of [place] by  pushing the new reservoir through the small fascial defect.  It had already  been created when we removed the old one.  Structural defect was closed with  two interrupted 2-0 Vicryl sutures.  Again the wound was copiously irrigated  and the tubing was connected.  The excess tubing was trimmed and care was  taken to place the tubing prior to connection to insure that no air was  trapped in the tubing.  After this was done, the pump was used to inflate  the device and again, the device was noted to be quite straight with no  disproportion of the right and left corpora cavernosa.  There was no longer  a lateral defect present in the left corpora cavernosum.   Once again the wound was copiously irrigated with antibiotic solution and  the incision was closed in two layers.  The first layer was a running 2-0 Vicryl suture to close the subcutaneous tissues.  The skin was then closed   with a stapling device.  The patient was then awakened from his anesthesia  and taken to the post anesthesia care unit in stable condition having  tolerated the procedure well.   Please note that Dr.  Marcelyn Bruins was present and participated in all  aspects of this case as he was Paramedic.     Susanne Borders, MD                           Jamison Neighbor, M.D.    DR/MEDQ  D:  07/29/2003  T:  07/29/2003  Job:  [id number]

## 2010-11-06 NOTE — Op Note (Signed)
Christopher Baker, Christopher Baker               ACCOUNT NO.:  [id number]   MEDICAL RECORD NO.:  [id number]          PATIENT TYPE:  INP   LOCATION:  1403                         FACILITY:  [facility]   PHYSICIAN:  Jamison Neighbor, M.D.  DATE OF BIRTH:  10-08-1933   DATE OF PROCEDURE:  DATE OF DISCHARGE:                                 OPERATIVE REPORT   PREOPERATIVE DIAGNOSES:  1.  Benign prostatic hypertrophy.  2.  Lower urinary tract symptoms.   POSTOPERATIVE DIAGNOSES:  1.  Benign prostatic hypertrophy.  2.  Lower urinary tract symptoms.   PROCEDURE:  1.  Cystourethroscopy.  2.  TURP.   SURGEON:  Jamison Neighbor, M.D.   ASSISTANT:  Glade Nurse, M.D.   ANESTHESIA:  General endotracheal.   SPECIMENS:  Prostate chips.   DESCRIPTION OF PROCEDURE:  The patient was identified by his wrist bracelet  and brought to room 10 where he received preoperative antibiotics and was  administered general anesthesia. He was prepped and draped in the usual  sterile fashion. Next using [employer] sounds, the patient's anterior urethra  was dilated from 24 to 32 [place] sequentially, no resistance was met. Next,  the 28 French resectoscope sheath with an obturator was inserted in the  patient's anterior urethra, placed to the level of the bladder. The  obturator was removed and clear urine output was obtained. Next, the  [facility] resectoscope was inserted through the resectoscope sheath. The  patient underwent pancystourethroscopy which revealed a normal bladder  urothelium without any foreign body or mucosal abnormalities. His ureteral  orifices were noted in their anatomic position effluxing clear urine  bilaterally. The patient's bladder neck was moderately opened. He had a  rather long prostatic urethra with prominent lateral lobes. We then  positioned the distal end of our resectoscope at the verumontanum and  resected the trough at the 6 o'clock position from the bladder neck to the  verumontanum.  Next, the prosthetic urethra was resected from the 6 o'clock  to the 11 o'clock position from the bladder neck to the verumontanum.  Excellent hemostasis was maintained throughout. Next, we resected  sequentially from the 6 o'clock position to the 1 o'clock position from the  bladder neck to the verumontanum. Once adequate depth had been established,  excellent hemostasis was obtained using coagulation setting. We then  resected from 1 o'clock to 11 o'clock and excellent hemostasis was obtained.  Next, all bladder chips were removed and the bladder was reinspected.  Ureteral orifices were noted to not be involved in the resection and  continued to efflux clear urine. The resectoscope was removed and an 27  [place] three way Foley  catheter was placed, clear urine output was obtained. It was placed to  continue his bladder irrigation and a small amount of traction. The patient  was then reversed from his anesthesia which he tolerated without  complications. Please note Dr. Marcelyn Bruins was present and participated in  all aspects of this case.       MT/MEDQ  D:  01/05/2005  T:  01/05/2005  Job:  [id number]

## 2010-11-06 NOTE — Discharge Summary (Signed)
[place] - Closed  Patient:    Christopher Baker, Christopher Baker                      MRN: [id number] Adm. Date:  [id number] Disc. Date: [id number] Attending:  Carrie Mew Dictator:   Cornell Barman, P.A. CC:         Stacie Glaze, M.D. [facility]  Dr. Domenica Fail, [facility]  Dewayne Shorter, M.D.   Discharge Summary  DISCHARGE DIAGNOSES: 1. Fever. 2. Leukocytosis. 3. Group B strep bacteremia. 4. Left foot cellulitis. 5. Mild mitral regurgitation. 6. Mild aortic stenosis.  BRIEF ADMISSION HISTORY:  Christopher Baker is a 77 year old white male who presents with acute onset of fever and chills on the evening prior to admission.  He was in his usual state of health until last evening when he developed chills. He also described some increased warmth of his right knee.  He is status post right total knee replacement in 1994 complicated by septic joint with Staph. This eventually required removal of his hardware in 1997.  The patient has not had any problems with his knee since 1997.  The patient described no new nausea, vomiting, dysuria, diarrhea, productive cough or abdominal pain.  He denied any pain in his knee or any swelling.  PAST MEDICAL HISTORY: 1. Osteoarthritis with multiple knee surgeries as described. 2. Hypertension. 3. Hyperlipidemia. 4. Ankylosing spondylitis. 5. Status post penile implant secondary to Peyronies disease. 6. Benign prostatic hypertrophy with an elevated PSA in the past. 7. History of perirectal abscess.  LABS ON ADMISSION:  White count was 18.5 with 89% neutrophils.  Sed rate was 1.  HOSPITAL COURSE:  The patient presented with fever and leukocytosis without any definite source initially.  However, the patient did begin to complain of some pain in his left foot.  Later in the day it was noted that his left foot was edematous and erythematous and warm to touch.  Most likely the patient had a cellulitis.  He was  empirically started on vancomycin and Rocephin.  We did ask for infectious disease to see the patient and Dewayne Shorter, M.D., did see the patient.  He agreed that the etiology of his acute onset of fever and rigors were most likely secondary to recurrent left leg cellulitis.  Blood cultures grew gram positive cocci in chains and two of two blood cultures, most likely secondary to a Strep infection.  The organism was identified as a group B Strep agalactiae.  Dr. Orvan Falconer discussed that his bacteremia puts him at risk for secondary prosthetic joint infection and endocarditis.  The patients fever has resolved and his white count has normalized.  We have repeated blood culture that demonstrate clearing of the bacteremia.  The patients 2D echo did not show any evidence of vegetation.  Dr. Orvan Falconer felt the patient could be discharged home on an oral regimen of Keflex and Rifampin for at least four weeks.  He does recommend the patient follow up with his orthopedist as scheduled.  Dr. Orvan Falconer stated he would also follow up with the patient in about two weeks.  LABS AT DISCHARGE:  Hemoglobin 13, hematocrit 38.8, white count 9.5.  Blood culture is positive for group B Strep bacteremia.  These were collected February 08, 2000.  A 2D echo did reveal mild aortic stenosis and mild mitral regurgitation with preserved left ventricular function.  No further cardiac evaluation was done.  MEDICATIONS:  The patient is to resume  his Lipitor, Hytrin and Celebrex as at home.  In addition, he will be on Keflex 500 mg q.i.d. for four weeks and Rifampin 300 mg b.i.d. for four weeks.  FOLLOW-UP:  The patient is to follow up with Stacie Glaze, M.D., in 10 to 14 days.  He is to follow up with Dr. Domenica Fail as scheduled and to follow up with Dr. Orvan Falconer in about three weeks and he will call for an appointment. DD:  02/10/00 TD:  02/11/00 Job: [id number]

## 2010-11-06 NOTE — Consult Note (Signed)
Christopher Baker, Christopher Baker               ACCOUNT NO.:  [id number]   MEDICAL RECORD NO.:  [id number]          PATIENT TYPE:  EMS   LOCATION:  MAJO                         FACILITY:  [facility]   PHYSICIAN:  Rosalyn Gess. Norins, M.D. [facility] OF BIRTH:  [birth year]   DATE OF CONSULTATION:  04/10/2005  DATE OF DISCHARGE:                                   CONSULTATION   TIME SEEN:  18:45 hours   CHIEF COMPLAINT:  Syncope.   HISTORY OF PRESENT ILLNESS:  Christopher Baker is a 77 year old married white male  who on this a.m. at the breakfast table had an episode where he fell off  balance and dizzy with some mild nausea. The symptoms were exacerbated by  flexing and extending his head. This led to blanching, diaphoresis, and a  transient loss of consciousness lasting less than a minute. His wife and son  were present and witnessed this event. The patient had no incontinence of  bowel or bladder. The patient was fully awake at the end of this episode.  EMS was called. They placed the patient in a supine position on the floor  and actually after being laid supine his symptoms resolved and have not  recurred. Because of these symptoms he was brought to the [facility]  emergency department for evaluation. Of note the patient had had a viral  illness with a fever to 100.4 on October 20th, had a nocturnal sweat, and  had no fever on this a.m.   PAST MEDICAL HISTORY:   SURGICAL:  1.  Perirectal abscess drainage.  2.  Penile implant for Peyronie's disease.  3.  Left inguinal hernia repair.  4.  Repair of malfunctioning implant with an aneurysm at the left side.   MEDICAL ILLNESS:  1.  Usual childhood disease.  2.  Hypertension.  3.  Hyperlipidemia.  4.  Ankylosing spondylitis.  5.  Osteoarthritis with multiple knee procedures.  6.  Benign prostatic hypertrophy with elevated PSA in the past, with      thorough evaluation.   CURRENT MEDICATIONS:  1.  Nabumetone 750 two tablets q.h.s.  2.   Benicar/hydrochlorothiazide 40/12.5 one daily.  3.  DynaCirc 5 mg daily.  4.  Prednisone 2.5 mg daily.  5.  Vytorin 10/20 one daily.  6.  Mucinex DM p.r.n.  7.  Actos p.r.n.  8.  Testim 1% apply daily.   FAMILY HISTORY:  Noncontributory.   SOCIAL HISTORY:  The patient is married, has a very supportive wife. 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   REVIEW OF SYSTEMS:  Negative for any constitutional, cardiovascular,  respiratory, GI, or GU problems except as noted above.   PHYSICAL EXAMINATION:  VITAL SIGNS: The patient is afebrile 98.6, blood  pressure has been stable ranging from 115/50 to 134/61, heart rate 70,  respirations 20, O2 saturation 98%.  GENERAL: This is a heavy set Caucasian gentleman in no acute distress.  HEENT: Normocephalic and atraumatic. Conjunctiva and sclerae are clear.  NECK: Supple.  CHEST: Clear with no CVA tenderness noted.  LUNGS: Good breath sounds are noted without  rales, rhonchi, or wheezes.  CARDIOVASCULAR: 2+ radial pulses.  No JVD. He had a carotid bruit on the  left. His precordium was quiet. He does have pectus excavatum. He had a  regular rate and rhythm with occasional PVC. No murmurs were appreciated.  ABDOMEN: Soft. No guarding. No rebound.  EXTREMITIES: Without deficiency.  NEUROLOGIC: The patient is awake, alert, and oriented to person, place,  time, and context. Cognitive function is normal. Cranial nerves II-XII are  intact with normal facial symmetry and muscle movement. No deviation of the  tongue or uvula. Extraocular muscles are intact. Pupils equal, round, and  reactive to light and accommodation. Motor strength is 5/5 throughout and  symmetrical. DTRs are normal.   DATABASE:  Sodium 138, potassium 3.7, chloride 106, BUN 25, glucose  132,  creatinine 1.3. Cardiac enzymes were negative times three.   STUDIES:  The patient had an MRI of the brain which showed no acute changes.  Question of punctate lacunes  in the left caudate region. 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 No other abnormalities were noted on preliminary read. Carotids  on preliminary read without having full rotational programming suggested  left ICA disease.   ASSESSMENT/PLAN:  1.  Syncope. The patient's symptoms are very suggestive and significant of      vestibular neuritis possibly secondary to viral illness with resolution      with an informal Epley's maneuver that he performed himself with head      movement and then being laid supine. The patient's symptoms do not      suggest cardiac syncope and during his 8 hour ER stay on telemetry h he      had no significant arrhythmia. Symptoms are not consistent with TIA      despite the presence of probable left ICA disease. The patient is to be      discharged home. He is instructed in Epley's maneuver should he have      recurrent dysfunction or dizziness. He is already on steroids. No      indication for Valtrex.  2.  Patient with probable internal carotid artery disease. He is started on      aspirin 325 mg daily with GI precautions.  He is to follow up with Dr.      Lovell Baker in regard to final report on his MRA and whether there is need      for further evaluation or intervention.   Discussed risks and benefits of the patient being discharged with the risks  being low.  Furthermore the patient has travel plans and I believe there is  no increased risk in travel and he is encouraged to keep his arrangements  and to see Dr. Lovell Baker upon his return. The patient's wife was present for  this discussion and concurs.   FINAL DISPOSITION:  Discharge home with follow-up with Dr. Lovell Baker.           ______________________________  Rosalyn Gess Norins, M.D. [facility]     MEN/MEDQ  D:  04/10/2005  T:  04/11/2005  Job:  [id number]

## 2010-11-06 NOTE — Op Note (Signed)
NAMESILVIA, MARKUSON                         ACCOUNT NO.:  [id number]   MEDICAL RECORD NO.:  [id number]                   PATIENT TYPE:  AMB   LOCATION:  DAY                                  FACILITY:  [facility]   PHYSICIAN:  Thornton Park. Daphine Deutscher, M.D.             DATE OF BIRTH:  [birth year]   DATE OF PROCEDURE:  07/12/2003  DATE OF DISCHARGE:                                 OPERATIVE REPORT   PREOPERATIVE DIAGNOSIS:  Left inguinal hernia.   POSTOPERATIVE DIAGNOSIS:  Left indirect inguinal hernia.   PROCEDURE:  Left inguinal herniorrhaphy with mesh.   SURGEON:  Thornton Park. Daphine Deutscher, M.D.   ANESTHESIA:  General endotracheal.   DESCRIPTION OF PROCEDURE:  Jorje Guild. Penkala  is a 77 year old man who comes  in today for left inguinal hernia followed by penile prosthesis pump change.  The patient was taken back to room six and given general anesthesia.  He  received cefazolin and gentamycin preop.  The inguinal region was checked,  marked, clipped and then scrubbed with Betadine scrub and then painted with  Betadine paint and draped sterilely. A small oblique incision was made on  the left side dividing the superficial epigastric vessel with a 4-0 Vicryl,  then exposing the external oblique which I did.  This was incised. There was  aberrant nerve coming out from the fascia anteriorly and going diagonally  across the operative field which I was able to preserve.  I then retracted  this superolaterally.  I exposed the cord structures, mobilized them with a  Penrose drain.  I exposed the floor and did not see evidence of a direct  hernia.  I went proximally and found an indirect hernia and I dissected it  free from the sac, opened up the hernia sac, reduced fat in an area of  sigmoid colon which was stuck to it somewhat.  I then over sewed the neck  with a 2-0 silk and tucked this back in and cut off the excess sac.   The ring was then approximated with a single 2-0 Prolene medially.  The  floor  was repaired with a piece of Davol mesh cut to fit and sutured to the  inguinal ligament with a running 2-0 Prolene.  It was tucked beneath the  external oblique and sewn in with a 2-0 Prolene and then sewn to itself with  a horizontal mattress suture which allowed it to be tucked beneath the  external oblique. Also the superomedial edge was tacked to the lateral  margin of the internal ring to kind of further snug the ring. The ring,  however, did allow plenty of room for the spermatic vessels. The wound was  irrigated with double antibiotic solution.  4-0 Vicryl was used in the  subcutaneous tissue and at that  point the wound was left for Dr. Logan Bores to then perform his portion of the  procedure.  He would subsequently  complete this closure.   FINAL DIAGNOSIS:  Left indirect inguinal hernia status post repair with  mesh.                                               Thornton Park Daphine Deutscher, M.D.    MBM/MEDQ  D:  07/12/2003  T:  07/12/2003  Job:  [id number]   cc:   Jamison Neighbor, M.D.  [address]  [place]  [place] [address]  Fax: [phone]

## 2010-11-17 ENCOUNTER — Ambulatory Visit (INDEPENDENT_AMBULATORY_CARE_PROVIDER_SITE_OTHER): Payer: Medicare Other | Admitting: Internal Medicine

## 2010-11-17 ENCOUNTER — Encounter: Payer: Self-pay | Admitting: Internal Medicine

## 2010-11-17 DIAGNOSIS — E785 Hyperlipidemia, unspecified: Secondary | ICD-10-CM

## 2010-11-17 DIAGNOSIS — I4891 Unspecified atrial fibrillation: Secondary | ICD-10-CM

## 2010-11-17 DIAGNOSIS — I10 Essential (primary) hypertension: Secondary | ICD-10-CM

## 2010-11-17 DIAGNOSIS — D649 Anemia, unspecified: Secondary | ICD-10-CM

## 2010-11-17 LAB — CBC WITH DIFFERENTIAL/PLATELET
Basophils Absolute: 0 10*3/uL (ref 0.0–0.1)
Lymphocytes Relative: 7.3 % — ABNORMAL LOW (ref 12.0–46.0)
Monocytes Relative: 7.2 % (ref 3.0–12.0)
Neutrophils Relative %: 85 % — ABNORMAL HIGH (ref 43.0–77.0)
Platelets: 205 10*3/uL (ref 150.0–400.0)
RDW: 15.8 % — ABNORMAL HIGH (ref 11.5–14.6)

## 2010-11-17 LAB — IRON: Iron: 90 ug/dL (ref 42–165)

## 2010-11-17 NOTE — Progress Notes (Signed)
  Subjective:    Patient ID: BECK COFER, male    DOB: Apr 22, 1934, 78 y.o.   MRN: [id number]  HPI Patient presents for followup of atrial fibrillation and hypertension.  Blood pressure is stable he has been playing golf without any difficulty   Review of Systems  Constitutional: Negative for fever and fatigue.  HENT: Negative for hearing loss, congestion, neck pain and postnasal drip.   Eyes: Negative for discharge, redness and visual disturbance.  Respiratory: Negative for cough, shortness of breath and wheezing.   Cardiovascular: Negative for leg swelling.  Gastrointestinal: Negative for abdominal pain, constipation and abdominal distention.  Genitourinary: Negative for urgency and frequency.  Musculoskeletal: Negative for joint swelling and arthralgias.  Skin: Negative for color change and rash.  Neurological: Negative for weakness and light-headedness.  Hematological: Negative for adenopathy.  Psychiatric/Behavioral: Negative for behavioral problems.   Past Medical History  Diagnosis Date  . Hyperlipidemia   . HTN (hypertension)   . Prostate cancer    Past Surgical History  Procedure Date  . Replacement total knee     reports that he has quit smoking. He does not have any smokeless tobacco history on file. He reports that he drinks alcohol. His drug history not on file. family history includes Early death in his father and Stroke in his mother. Allergies  Allergen Reactions  . Ciprofloxacin     REACTION: GI upset       Objective:   Physical Exam  Constitutional: He appears well-developed and well-nourished.  HENT:  Head: Normocephalic and atraumatic.  Eyes: Conjunctivae are normal. Pupils are equal, round, and reactive to light.  Neck: Normal range of motion. Neck supple.  Cardiovascular: Normal rate and regular rhythm.   Pulmonary/Chest: Effort normal and breath sounds normal.  Abdominal: Soft. Bowel sounds are normal.          Assessment & Plan:    Blood pressure is stable pulse is stable with rate controlled atrial fibrillation lung fields are clear His history of blood loss anemia with a low iron we'll monitor I and level today and decide whether or not he needs to stay on the   He has not been as consistent with use as prior

## 2010-11-19 ENCOUNTER — Other Ambulatory Visit: Payer: Self-pay | Admitting: Internal Medicine

## 2010-11-26 ENCOUNTER — Other Ambulatory Visit: Payer: Self-pay | Admitting: Internal Medicine

## 2010-11-27 ENCOUNTER — Other Ambulatory Visit: Payer: Self-pay

## 2010-11-27 MED ORDER — ATORVASTATIN CALCIUM 20 MG PO TABS: 20.0000 mg | ORAL_TABLET | Freq: Every day | ORAL | Status: DC

## 2010-12-11 ENCOUNTER — Other Ambulatory Visit: Payer: Self-pay | Admitting: *Deleted

## 2010-12-11 MED ORDER — EZETIMIBE 10 MG PO TABS: 10.0000 mg | ORAL_TABLET | Freq: Every day | ORAL | Status: DC

## 2010-12-25 ENCOUNTER — Other Ambulatory Visit: Payer: Self-pay | Admitting: Internal Medicine

## 2011-01-07 ENCOUNTER — Other Ambulatory Visit: Payer: Self-pay | Admitting: Internal Medicine

## 2011-01-11 ENCOUNTER — Other Ambulatory Visit: Payer: Self-pay | Admitting: Internal Medicine

## 2011-01-18 ENCOUNTER — Other Ambulatory Visit: Payer: Self-pay | Admitting: Internal Medicine

## 2011-01-27 ENCOUNTER — Other Ambulatory Visit: Payer: Self-pay | Admitting: Internal Medicine

## 2011-02-17 ENCOUNTER — Ambulatory Visit: Payer: 59 | Admitting: Internal Medicine

## 2011-03-02 ENCOUNTER — Other Ambulatory Visit: Payer: Self-pay | Admitting: Internal Medicine

## 2011-03-09 ENCOUNTER — Other Ambulatory Visit: Payer: Self-pay | Admitting: Internal Medicine

## 2011-03-11 ENCOUNTER — Telehealth: Payer: Self-pay | Admitting: *Deleted

## 2011-03-11 MED ORDER — ESTAZOLAM 1 MG PO TABS: 1.0000 mg | ORAL_TABLET | Freq: Every day | ORAL | Status: DC

## 2011-03-11 NOTE — Telephone Encounter (Signed)
May have prosam 1 mg q hs prn sleep #30 with 1 refill-per dr Lovell Sheehan

## 2011-03-11 NOTE — Telephone Encounter (Signed)
Notified pt. 

## 2011-03-11 NOTE — Telephone Encounter (Signed)
Pt called c/o that restoril caused him to sleep walk.  Would like to try another sleep med

## 2011-03-11 NOTE — Telephone Encounter (Signed)
Per dr Lovell Sheehan- may have prosam 1 mg #30 1 qhs prn with 1 refills

## 2011-03-19 LAB — CBC
HCT: 37 — ABNORMAL LOW
Hemoglobin: 12.3 — ABNORMAL LOW
MCHC: 33.8
MCV: 90.8
Platelets: 202
Platelets: 203
RBC: 4.08 — ABNORMAL LOW
RDW: 13.9
WBC: 7.5

## 2011-03-19 LAB — DIFFERENTIAL
Basophils Absolute: 0
Basophils Relative: 0
Basophils Relative: 1
Lymphs Abs: 0.7
Monocytes Absolute: 0.9
Monocytes Absolute: 1
Monocytes Relative: 8
Neutro Abs: 5.1
Neutro Abs: 9.6 — ABNORMAL HIGH
Neutrophils Relative %: 85 — ABNORMAL HIGH

## 2011-03-19 LAB — COMPREHENSIVE METABOLIC PANEL
ALT: 25
AST: 23
Albumin: 2.8 — ABNORMAL LOW
Albumin: 2.9 — ABNORMAL LOW
Albumin: 3.5
Alkaline Phosphatase: 71
Alkaline Phosphatase: 72
Alkaline Phosphatase: 76
BUN: 10
BUN: 13
BUN: 20
CO2: 30
Calcium: 9.1
Chloride: 110
Creatinine, Ser: 1.21
Creatinine, Ser: 1.21
GFR calc Af Amer: >60
GFR calc non Af Amer: 59 — ABNORMAL LOW
Glucose, Bld: 103 — ABNORMAL HIGH
Glucose, Bld: 138 — ABNORMAL HIGH
Potassium: 3.9
Potassium: 3.9
Potassium: 4.5
Sodium: 141
Total Bilirubin: 0.7
Total Protein: 5.2 — ABNORMAL LOW
Total Protein: 6.1

## 2011-03-19 LAB — URINALYSIS, ROUTINE W REFLEX MICROSCOPIC
Glucose, UA: NEGATIVE
Hgb urine dipstick: NEGATIVE
pH: 7

## 2011-03-20 ENCOUNTER — Other Ambulatory Visit: Payer: Self-pay | Admitting: Internal Medicine

## 2011-03-25 ENCOUNTER — Ambulatory Visit (INDEPENDENT_AMBULATORY_CARE_PROVIDER_SITE_OTHER): Payer: Medicare Other | Admitting: Internal Medicine

## 2011-03-25 VITALS — BP 140/80 | HR 80 | Temp 98.2°F | Resp 16 | Ht 75.0 in | Wt 238.0 lb

## 2011-03-25 DIAGNOSIS — M1710 Unilateral primary osteoarthritis, unspecified knee: Secondary | ICD-10-CM

## 2011-03-25 DIAGNOSIS — Z23 Encounter for immunization: Secondary | ICD-10-CM

## 2011-03-25 DIAGNOSIS — L4050 Arthropathic psoriasis, unspecified: Secondary | ICD-10-CM

## 2011-03-25 DIAGNOSIS — D649 Anemia, unspecified: Secondary | ICD-10-CM

## 2011-03-25 DIAGNOSIS — I4891 Unspecified atrial fibrillation: Secondary | ICD-10-CM

## 2011-03-25 LAB — CBC WITH DIFFERENTIAL/PLATELET
Basophils Relative: 0.6 % (ref 0.0–3.0)
Eosinophils Absolute: 0.2 10*3/uL (ref 0.0–0.7)
Hemoglobin: 13.9 g/dL (ref 13.0–17.0)
MCHC: 32.4 g/dL (ref 30.0–36.0)
MCV: 94 fl (ref 78.0–100.0)
Monocytes Absolute: 1 10*3/uL (ref 0.1–1.0)
Neutro Abs: 5.8 10*3/uL (ref 1.4–7.7)
RBC: 4.55 Mil/uL (ref 4.22–5.81)
RDW: 15.2 % — ABNORMAL HIGH (ref 11.5–14.6)

## 2011-03-25 MED ORDER — ESTAZOLAM 1 MG PO TABS: 2.0000 mg | ORAL_TABLET | Freq: Every day | ORAL | Status: DC

## 2011-03-25 MED ORDER — ESTAZOLAM 2 MG PO TABS: 2.0000 mg | ORAL_TABLET | Freq: Every day | ORAL | Status: DC

## 2011-03-25 MED ORDER — HYDROXYCHLOROQUINE SULFATE 200 MG PO TABS: 200.0000 mg | ORAL_TABLET | Freq: Two times a day (BID) | ORAL | Status: DC

## 2011-03-25 NOTE — Progress Notes (Signed)
Subjective:    Patient ID: Christopher Baker, male    DOB: 04/28/1934, 77 y.o.   MRN: [id number]  HPI  Patient is a 77 year old white male who is followed for hyperlipidemia hypertension osteoarthritis and psoriasis He is also felt to have a form of psoriatic arthritis He is noted to have an iron deficiency anemia He is followed by cardiology for atrial fibrillation and has a history of thrombophlebitis of the lower extremity General he has been doing well with the exception of knee pain that interferes with his ability to do golf  Review of Systems  Constitutional: Negative for fever and fatigue.  HENT: Negative for hearing loss, congestion, neck pain and postnasal drip.   Eyes: Negative for discharge, redness and visual disturbance.  Respiratory: Negative for cough, shortness of breath and wheezing.   Cardiovascular: Negative for leg swelling.  Gastrointestinal: Negative for abdominal pain, constipation and abdominal distention.  Genitourinary: Negative for urgency and frequency.  Musculoskeletal: Negative for joint swelling and arthralgias.  Skin: Negative for color change and rash.  Neurological: Negative for weakness and light-headedness.  Hematological: Negative for adenopathy.  Psychiatric/Behavioral: Negative for behavioral problems.   Past Medical History  Diagnosis Date  . Hyperlipidemia   . HTN (hypertension)   . Status post placement of cardiac pacemaker DDD-  06-04-10-- LAST CHECK 09-08-10 IN EPIC    SECONDARY TO SYNCOPY AND BRADYCARDIA  . Restless leg syndrome   . Normal nuclear stress test 2008    LOW RISK--   DR  WALL  . Carotid stenosis, bilateral PER DR EARLY / DUPLEX  09-09-10      40 - 59%   BILATERALLY--- ASYMPTOMATIC  . History of echocardiogram 11-21-2006    EF 65%  . History of prostate cancer S/P RADIATION  [place]  6 YRS AGO  . Psoriasis   . Arthritis with psoriasis   . Sleep apnea TESTED YRS AGO-- NO CPAP RX GIVEN  . GERD (gastroesophageal reflux disease)      CONTROLLED W/ PROTONIX  . Borderline diabetes     DIET CONTROLLED  . Lumbar spondylosis W/ RADICULOPATHY  . Iron deficiency   . Cataract immature LEFT EYE  . PAF (paroxysmal atrial fibrillation)   . SVT (supraventricular tachycardia)     S/P ABLATION   2008  . Coronary artery disease CARDIOLOGIST- DR Sharrell Ku    09-08-10 VISIT AND PACEMAKER CHECK  IN EPIC    History   Social History  . Marital Status: Married    Spouse Name: N/A    Number of Children: N/A  . Years of Education: N/A   Occupational History  . retired    Social History Main Topics  . Smoking status: Never Smoker   . Smokeless tobacco: Never Used  . Alcohol Use: Yes     RARE  . Drug Use: No  . Sexually Active: Yes   Other Topics Concern  . Not on file   Social History Narrative  . No narrative on file    Past Surgical History  Procedure Date  . Replacement total knee 1997    RIGHT  . Cardiac pacemaker placement 06-04-10    DDD/ AND REMOVAL LOOR RECORDER  . Loop recorder placement 01-30-10  . Lumbar laminectomy/ diskectomy/ fusion 05-19-10    L4 - 5  . Cholecystectomy 2009  . Cardiac electrophysiology study and ablation 2008    FOR SVT  . Prostate biopsy 2007  . Transurethral resection of prostate 2006  . Inguinal hernia repair  2005    LEFT/   DONE WITH PENILE PROSTESIS SURG.  . Removal and placement penile prosthesis implant  2005    AND LEFT CORPOROPLASTY /    DONE INGUINAL REPAIR  . Penile prosthesis implant 1995  . Knee arthroscopy 06/02/2011    Procedure: ARTHROSCOPY KNEE;  Surgeon: Loanne Drilling;  Location: [facility];  Service: Orthopedics;  Laterality: Left;  LEFT KNEE ARTHROSCOPY WITH DEBRIDEMENT    Family History  Problem Relation Age of Onset  . Stroke Mother   . Early death Father     Allergies  Allergen Reactions  . Ciprofloxacin     REACTION: GI upset    Current Outpatient Prescriptions on File Prior to Visit  Medication Sig Dispense Refill    . amLODipine (NORVASC) 5 MG tablet Take 5 mg by mouth every evening.       . ezetimibe (ZETIA) 10 MG tablet Take 1 tablet (10 mg total) by mouth daily.  30 tablet  9  . OXYBUTYNIN CHLORIDE PO Take by mouth 2 (two) times daily.       . pantoprazole (PROTONIX) 40 MG tablet TAKE ONE TABLET BY MOUTH TWICE DAILY  60 tablet  6  . triamcinolone (KENALOG) 0.1 % lotion Apply 1 application topically as needed. As ditrected      . vitamin C (ASCORBIC ACID) 500 MG tablet Take 500 mg by mouth daily.       . Vitamin D, Ergocalciferol, (DRISDOL) 50000 UNITS CAPS TAKE ONE CAPSULE BY MOUTH WEEKLY  5 capsule  2    BP 140/80  Pulse 80  Temp 98.2 F (36.8 C)  Resp 16  Ht 6\' 3"  (1.905 m)  Wt 238 lb (107.956 kg)  BMI 29.75 kg/m2       Objective:   Physical Exam  Nursing note and vitals reviewed. Constitutional: He appears well-developed and well-nourished.  HENT:  Head: Normocephalic and atraumatic.  Eyes: Conjunctivae are normal. Pupils are equal, round, and reactive to light.  Neck: Normal range of motion. Neck supple.  Cardiovascular: Normal rate and regular rhythm.   Pulmonary/Chest: Effort normal and breath sounds normal.  Abdominal: Soft. Bowel sounds are normal.          Assessment & Plan:  He has degenerative joint disease of the knees probable meniscal tear consideration for anti-inflammatory therapy such as Relafen and refer her to an orthopedist for evaluation.  His psoriasis is stable on his current medications monitoring liver functions and renal functions He was given shot today Medications reviewed Pressure stable Atrial fibrillation stable  Monitor CBC differential history of anemia

## 2011-03-25 NOTE — Patient Instructions (Signed)
May take two of the 1 mg prosom

## 2011-04-04 ENCOUNTER — Other Ambulatory Visit: Payer: Self-pay | Admitting: Internal Medicine

## 2011-04-05 ENCOUNTER — Other Ambulatory Visit: Payer: Self-pay | Admitting: *Deleted

## 2011-04-05 MED ORDER — ESTAZOLAM 2 MG PO TABS: 2.0000 mg | ORAL_TABLET | Freq: Every day | ORAL | Status: DC

## 2011-04-06 NOTE — Progress Notes (Signed)
Quick Note:  Talked with wife and sh e will make sure he is taking ______

## 2011-04-06 NOTE — Progress Notes (Signed)
Pt 's wife informed and and will make sure he is taking or will start taking

## 2011-04-07 LAB — CBC
HCT: 38.6 — ABNORMAL LOW
Hemoglobin: 13.2
MCHC: 34.2
MCV: 96.8
Platelets: 205
RBC: 3.99 — ABNORMAL LOW
RDW: 13.2
WBC: 6.4

## 2011-04-07 LAB — COMPREHENSIVE METABOLIC PANEL
ALT: 21
AST: 25
Albumin: 3.8
Alkaline Phosphatase: 101
BUN: 23
CO2: 27
Calcium: 9.1
Chloride: 104
Creatinine, Ser: 1.14
GFR calc Af Amer: >60
GFR calc non Af Amer: >60
Glucose, Bld: 110 — ABNORMAL HIGH
Potassium: 3.9
Sodium: 137
Total Bilirubin: 0.8
Total Protein: 6.4

## 2011-04-08 LAB — CBC
HCT: 36.9 — ABNORMAL LOW
Hemoglobin: 12.5 — ABNORMAL LOW
MCHC: 34
MCV: 98.7
Platelets: 209
RBC: 3.74 — ABNORMAL LOW
RDW: 13.5
WBC: 6.1

## 2011-04-08 LAB — BASIC METABOLIC PANEL
Calcium: 9.1
GFR calc non Af Amer: >60
Glucose, Bld: 104 — ABNORMAL HIGH
Sodium: 145

## 2011-04-08 LAB — APTT: aPTT: 24

## 2011-04-08 LAB — PROTIME-INR
INR: 1
Prothrombin Time: 13.6

## 2011-05-05 ENCOUNTER — Other Ambulatory Visit: Payer: Self-pay | Admitting: Internal Medicine

## 2011-05-20 ENCOUNTER — Ambulatory Visit: Payer: Self-pay | Admitting: Ophthalmology

## 2011-05-24 ENCOUNTER — Encounter: Payer: Self-pay | Admitting: Internal Medicine

## 2011-05-24 ENCOUNTER — Ambulatory Visit (INDEPENDENT_AMBULATORY_CARE_PROVIDER_SITE_OTHER): Payer: Medicare Other | Admitting: Internal Medicine

## 2011-05-24 VITALS — BP 140/80 | HR 80 | Temp 98.6°F | Resp 16 | Ht 75.0 in | Wt 236.0 lb

## 2011-05-24 DIAGNOSIS — L409 Psoriasis, unspecified: Secondary | ICD-10-CM

## 2011-05-24 DIAGNOSIS — T887XXA Unspecified adverse effect of drug or medicament, initial encounter: Secondary | ICD-10-CM

## 2011-05-24 DIAGNOSIS — L408 Other psoriasis: Secondary | ICD-10-CM

## 2011-05-24 DIAGNOSIS — I4891 Unspecified atrial fibrillation: Secondary | ICD-10-CM

## 2011-05-24 DIAGNOSIS — D509 Iron deficiency anemia, unspecified: Secondary | ICD-10-CM

## 2011-05-24 MED ORDER — POLYSACCHARIDE IRON COMPLEX 150 MG PO CAPS: 150.0000 mg | ORAL_CAPSULE | Freq: Two times a day (BID) | ORAL | Status: DC

## 2011-05-24 MED ORDER — METHOTREXATE 2.5 MG PO TABS: 10.0000 mg | ORAL_TABLET | ORAL | Status: DC

## 2011-05-24 NOTE — Patient Instructions (Signed)
Take the iron twice a day with food

## 2011-05-24 NOTE — Progress Notes (Signed)
Subjective:    Patient ID: Christopher Baker, male    DOB: 1933/12/12, 77 y.o.   MRN: [id number]  HPI The pt had blepharoplasty done one week ago Increased stress due to the recurrence of cirrhosis in his wife's liver ( she is s/p transplant) The pt has stable AF and HTN He has arthroscopy planned on the left ( allusio)      Review of Systems  Constitutional: Negative for fever and fatigue.  HENT: Negative for hearing loss, congestion, neck pain and postnasal drip.   Eyes: Negative for discharge, redness and visual disturbance.  Respiratory: Negative for cough, shortness of breath and wheezing.   Cardiovascular: Negative for leg swelling.  Gastrointestinal: Negative for abdominal pain, constipation and abdominal distention.  Genitourinary: Negative for urgency and frequency.  Musculoskeletal: Negative for joint swelling and arthralgias.  Skin: Negative for color change and rash.  Neurological: Negative for weakness and light-headedness.  Hematological: Negative for adenopathy.  Psychiatric/Behavioral: Negative for behavioral problems.   Past Medical History  Diagnosis Date  . Hyperlipidemia   . HTN (hypertension)   . Prostate cancer     History   Social History  . Marital Status: Married    Spouse Name: N/A    Number of Children: N/A  . Years of Education: N/A   Occupational History  . retired    Social History Main Topics  . Smoking status: Former Games developer  . Smokeless tobacco: Not on file  . Alcohol Use: Yes  . Drug Use: Not on file  . Sexually Active: Yes   Other Topics Concern  . Not on file   Social History Narrative  . No narrative on file    Past Surgical History  Procedure Date  . Replacement total knee     Family History  Problem Relation Age of Onset  . Stroke Mother   . Early death Father     Allergies  Allergen Reactions  . Ciprofloxacin     REACTION: GI upset    Current Outpatient Prescriptions on File Prior to Visit  Medication  Sig Dispense Refill  . amLODipine (NORVASC) 5 MG tablet Take 5 mg by mouth daily.        Marland Kitchen atorvastatin (LIPITOR) 20 MG tablet Take 1 tablet (20 mg total) by mouth daily.  90 tablet  3  . ezetimibe (ZETIA) 10 MG tablet Take 1 tablet (10 mg total) by mouth daily.  30 tablet  9  . hydroxychloroquine (PLAQUENIL) 200 MG tablet Take 1 tablet (200 mg total) by mouth 2 (two) times daily.  60 tablet  2  . iron polysaccharides (NU-IRON) 150 MG capsule 1 tab po qd       . nabumetone (RELAFEN) 750 MG tablet TAKE TWO TABLETS DAILY  60 tablet  3  . OXYBUTYNIN CHLORIDE PO Take by mouth 2 (two) times daily.       . pantoprazole (PROTONIX) 40 MG tablet TAKE ONE TABLET BY MOUTH TWICE DAILY  60 tablet  6  . predniSONE (DELTASONE) 5 MG tablet TAKE 1 TABLET BY MOUTH ONCE A DAY  30 tablet  0  . rOPINIRole (REQUIP) 2 MG tablet TAKE ONE TABLET BY MOUTH DAILY  30 tablet  3  . triamcinolone (KENALOG) 0.1 % lotion As ditrected       . vitamin C (ASCORBIC ACID) 500 MG tablet Take 500 mg by mouth daily.        . Vitamin D, Ergocalciferol, (DRISDOL) 50000 UNITS CAPS TAKE ONE CAPSULE BY MOUTH  WEEKLY  5 capsule  2    BP 140/80  Pulse 80  Temp 98.6 F (37 C)  Resp 16  Ht 6\' 3"  (1.905 m)  Wt 236 lb (107.049 kg)  BMI 29.50 kg/m2        Objective:   Physical Exam  Nursing note and vitals reviewed. Constitutional: He appears well-developed and well-nourished.  HENT:  Head: Normocephalic and atraumatic.  Eyes: Conjunctivae are normal. Pupils are equal, round, and reactive to light.  Neck: Normal range of motion. Neck supple.  Cardiovascular: Normal rate and regular rhythm.   Pulmonary/Chest: Effort normal and breath sounds normal.  Abdominal: Soft. Bowel sounds are normal.          Assessment & Plan:  Ration has psoriasis with psoriatic arthritis.  He failed Plaquenil he is on low-dose steroids without full relief topical steroids did make an impact on the rash... We will try methotrexate we have  discussed the potential side effects and that is  Hypertension is stable anemia for stable In deficiency anemia with low iron stable CBC we'll place on 150 mg of Twice a day

## 2011-06-01 ENCOUNTER — Encounter (HOSPITAL_BASED_OUTPATIENT_CLINIC_OR_DEPARTMENT_OTHER): Payer: Self-pay | Admitting: *Deleted

## 2011-06-01 NOTE — Progress Notes (Signed)
NPO AFTER MN WITH EXCEPTION CLEAR LIQUIDS UNTIL 0800. PT ARRIVES AT 1200. NEEDS ISTAT. CURRENT EKG AND CXR IN EPIC. WILL TAKE PREDNISONE AND NORVASC AM OF SURG. W/ SIP OF WATER.

## 2011-06-01 NOTE — H&P (Signed)
CC- Christopher Baker is a 77 y.o. male who presents with left knee pain.  HPI- . Knee Pain: Patient presents with a knee injury involving the  left knee. Onset of the symptoms was several months ago. Inciting event: twisting injury while walking. Current symptoms include giving out, pain located laterally and popping sensation. Pain is aggravated by going up and down stairs, lateral movements and squatting.  Patient has had no prior knee problems. Evaluation to date: MRI: lateral meniscal tear. Treatment to date: corticosteroid injection which was not very effective and rest.  Past Medical History  Diagnosis Date  . Hyperlipidemia   . HTN (hypertension)   . Status post placement of cardiac pacemaker DDD-  06-04-10-- LAST CHECK 09-08-10 IN EPIC    SECONDARY TO SYNCOPY AND BRADYCARDIA  . Restless leg syndrome   . Normal nuclear stress test 2008    LOW RISK--   DR  WALL  . Carotid stenosis, bilateral PER DR EARLY / DUPLEX  09-09-10      40 - 59%   BILATERALLY--- ASYMPTOMATIC  . History of echocardiogram 11-21-2006    EF 65%  . History of prostate cancer S/P RADIATION  TX  6 YRS AGO  . Psoriasis   . Arthritis with psoriasis   . Sleep apnea TESTED YRS AGO-- NO CPAP RX GIVEN  . GERD (gastroesophageal reflux disease)     CONTROLLED W/ PROTONIX  . Borderline diabetes     DIET CONTROLLED  . Lumbar spondylosis W/ RADICULOPATHY  . Iron deficiency   . Cataract immature LEFT EYE  . PAF (paroxysmal atrial fibrillation)   . SVT (supraventricular tachycardia)     S/P ABLATION   2008  . Coronary artery disease CARDIOLOGIST- DR Sharrell Ku    09-08-10 VISIT AND PACEMAKER CHECK  IN EPIC    Past Surgical History  Procedure Date  . Replacement total knee 1997    RIGHT  . Cardiac pacemaker placement 06-04-10    DDD/ AND REMOVAL LOOR RECORDER  . Loop recorder placement 01-30-10  . Lumbar laminectomy/ diskectomy/ fusion 05-19-10    L4 - 5  . Cholecystectomy 2009  . Cardiac electrophysiology  study and ablation 2008    FOR SVT  . Prostate biopsy 2007  . Transurethral resection of prostate 2006  . Inguinal hernia repair 2005    LEFT/   DONE WITH PENILE PROSTESIS SURG.  . Removal and placement penile prosthesis implant  2005    AND LEFT CORPOROPLASTY /    DONE INGUINAL REPAIR  . Penile prosthesis implant 1995    Prior to Admission medications   Medication Sig Start Date End Date Taking? Authorizing Provider  amLODipine (NORVASC) 5 MG tablet Take 5 mg by mouth every evening.    Yes Historical Provider, MD  atorvastatin (LIPITOR) 20 MG tablet Take 20 mg by mouth every evening.   11/27/10  Yes Lewayne Bunting, MD  ezetimibe (ZETIA) 10 MG tablet Take 1 tablet (10 mg total) by mouth daily. 12/11/10  Yes Lewayne Bunting, MD  iron polysaccharides (FERREX 150) 150 MG capsule Take 1 capsule (150 mg total) by mouth 2 (two) times daily. 05/24/11 05/23/12 Yes John Arletta Bale  methotrexate (RHEUMATREX) 2.5 MG tablet Take 4 tablets (10 mg total) by mouth once a week. Caution:Chemotherapy. Protect from light. 05/24/11 06/23/11 Yes John Arletta Bale  nabumetone (RELAFEN) 750 MG tablet   03/02/11  Yes John Arletta Bale  OXYBUTYNIN CHLORIDE PO Take by mouth 2 (two) times daily.    Yes  Historical Provider, MD  pantoprazole (PROTONIX) 40 MG tablet TAKE ONE TABLET BY MOUTH TWICE DAILY 01/11/11  Yes John Arletta Bale  predniSONE (DELTASONE) 5 MG tablet TAKE 1 TABLET BY MOUTH ONCE A DAY 01/27/11  Yes John Arletta Bale  rOPINIRole (REQUIP) 2 MG tablet TAKE ONE TABLET BY MOUTH DAILY 05/05/11  Yes John Arletta Bale  triamcinolone (KENALOG) 0.1 % lotion Apply 1 application topically as needed. As ditrected   Yes Historical Provider, MD  vitamin C (ASCORBIC ACID) 500 MG tablet Take 500 mg by mouth daily.    Yes Historical Provider, MD  Vitamin D, Ergocalciferol, (DRISDOL) 50000 UNITS CAPS TAKE ONE CAPSULE BY MOUTH WEEKLY 01/07/11  Yes John Arletta Bale   KNEE EXAM antalgic gait, soft tissue tenderness over  lateral joint line, reduced range of motion, negative drawer sign, collateral ligaments intact, normal ipsilateral hip exam  Physical Examination: General appearance - alert, well appearing, and in no distress Mental status - alert, oriented to person, place, and time Chest - clear to auscultation, no wheezes, rales or rhonchi, symmetric air entry Heart - normal rate, regular rhythm, normal S1, S2, no murmurs, rubs, clicks or gallops Abdomen - soft, nontender, nondistended, no masses or organomegaly Neurological - alert, oriented, normal speech, no focal findings or movement disorder noted   Asessment/Plan--- Left knee laterall meniscal tear- - Plan left knee arthroscopy with meniscal debridement. Procedure risks and potential comps discussed with patient who elects to proceed. Goals are decreased pain and increased function with a high likelihood of achieving both   Gus Rankin. Yamila Cragin, MD    06/01/2011, 10:02 PM

## 2011-06-02 ENCOUNTER — Encounter (HOSPITAL_BASED_OUTPATIENT_CLINIC_OR_DEPARTMENT_OTHER): Payer: Self-pay | Admitting: Anesthesiology

## 2011-06-02 ENCOUNTER — Encounter (HOSPITAL_BASED_OUTPATIENT_CLINIC_OR_DEPARTMENT_OTHER)
Admission: RE | Disposition: A | Discharge status: Home / Self Care | Payer: Self-pay | Source: Ambulatory Visit | Attending: Orthopedic Surgery

## 2011-06-02 ENCOUNTER — Ambulatory Visit (HOSPITAL_BASED_OUTPATIENT_CLINIC_OR_DEPARTMENT_OTHER): Payer: Medicare Other | Admitting: Anesthesiology

## 2011-06-02 ENCOUNTER — Ambulatory Visit (HOSPITAL_BASED_OUTPATIENT_CLINIC_OR_DEPARTMENT_OTHER)
Admission: RE | Admit: 2011-06-02 | Discharge: 2011-06-02 | Disposition: A | Discharge status: Home / Self Care | Payer: Medicare Other | Source: Ambulatory Visit | Attending: Orthopedic Surgery | Admitting: Orthopedic Surgery

## 2011-06-02 ENCOUNTER — Encounter (HOSPITAL_BASED_OUTPATIENT_CLINIC_OR_DEPARTMENT_OTHER): Payer: Self-pay | Admitting: *Deleted

## 2011-06-02 DIAGNOSIS — I4891 Unspecified atrial fibrillation: Secondary | ICD-10-CM | POA: Insufficient documentation

## 2011-06-02 DIAGNOSIS — Z8546 Personal history of malignant neoplasm of prostate: Secondary | ICD-10-CM | POA: Insufficient documentation

## 2011-06-02 DIAGNOSIS — M25569 Pain in unspecified knee: Secondary | ICD-10-CM | POA: Insufficient documentation

## 2011-06-02 DIAGNOSIS — D509 Iron deficiency anemia, unspecified: Secondary | ICD-10-CM | POA: Insufficient documentation

## 2011-06-02 DIAGNOSIS — R7309 Other abnormal glucose: Secondary | ICD-10-CM | POA: Insufficient documentation

## 2011-06-02 DIAGNOSIS — Z79899 Other long term (current) drug therapy: Secondary | ICD-10-CM | POA: Insufficient documentation

## 2011-06-02 DIAGNOSIS — K219 Gastro-esophageal reflux disease without esophagitis: Secondary | ICD-10-CM | POA: Insufficient documentation

## 2011-06-02 DIAGNOSIS — G4730 Sleep apnea, unspecified: Secondary | ICD-10-CM | POA: Insufficient documentation

## 2011-06-02 DIAGNOSIS — S83209A Unspecified tear of unspecified meniscus, current injury, unspecified knee, initial encounter: Secondary | ICD-10-CM

## 2011-06-02 DIAGNOSIS — E785 Hyperlipidemia, unspecified: Secondary | ICD-10-CM | POA: Insufficient documentation

## 2011-06-02 DIAGNOSIS — IMO0002 Reserved for concepts with insufficient information to code with codable children: Secondary | ICD-10-CM | POA: Insufficient documentation

## 2011-06-02 DIAGNOSIS — Z950 Presence of cardiac pacemaker: Secondary | ICD-10-CM | POA: Insufficient documentation

## 2011-06-02 DIAGNOSIS — I2510 Atherosclerotic heart disease of native coronary artery without angina pectoris: Secondary | ICD-10-CM | POA: Insufficient documentation

## 2011-06-02 DIAGNOSIS — M2240 Chondromalacia patellae, unspecified knee: Secondary | ICD-10-CM | POA: Insufficient documentation

## 2011-06-02 DIAGNOSIS — I10 Essential (primary) hypertension: Secondary | ICD-10-CM | POA: Insufficient documentation

## 2011-06-02 HISTORY — DX: Supraventricular tachycardia: I47.1

## 2011-06-02 HISTORY — DX: Prediabetes: R73.03

## 2011-06-02 HISTORY — DX: Gastro-esophageal reflux disease without esophagitis: K21.9

## 2011-06-02 HISTORY — DX: Paroxysmal atrial fibrillation: I48.0

## 2011-06-02 HISTORY — DX: Restless legs syndrome: G25.81

## 2011-06-02 HISTORY — DX: Occlusion and stenosis of bilateral carotid arteries: I65.23

## 2011-06-02 HISTORY — DX: Supraventricular tachycardia, unspecified: I47.10

## 2011-06-02 HISTORY — DX: Spondylosis without myelopathy or radiculopathy, lumbar region: M47.816

## 2011-06-02 HISTORY — DX: Arthropathic psoriasis, unspecified: L40.50

## 2011-06-02 HISTORY — DX: Iron deficiency: E61.1

## 2011-06-02 HISTORY — PX: KNEE ARTHROSCOPY: SHX127

## 2011-06-02 LAB — POCT I-STAT 4, (NA,K, GLUC, HGB,HCT)
HCT: 41 % (ref 39.0–52.0)
Hemoglobin: 13.9 g/dL (ref 13.0–17.0)
Sodium: 143 mEq/L (ref 135–145)

## 2011-06-02 SURGERY — ARTHROSCOPY, KNEE
Anesthesia: Choice | Site: Knee | Laterality: Left | Wound class: Clean

## 2011-06-02 MED ORDER — CHLORHEXIDINE GLUCONATE 4 % EX LIQD: 60.0000 mL | Freq: Once | CUTANEOUS | Status: DC

## 2011-06-02 MED ORDER — POVIDONE-IODINE 7.5 % EX SOLN: Freq: Once | CUTANEOUS | Status: DC

## 2011-06-02 MED ORDER — KETOROLAC TROMETHAMINE 30 MG/ML IJ SOLN
INTRAMUSCULAR | Status: DC | PRN
  Administered 2011-06-02: 15 mg via INTRAVENOUS

## 2011-06-02 MED ORDER — LACTATED RINGERS IV SOLN
INTRAVENOUS | Status: DC
  Administered 2011-06-02: 14:00:00 via INTRAVENOUS

## 2011-06-02 MED ORDER — METHOCARBAMOL 500 MG PO TABS: 500.0000 mg | ORAL_TABLET | Freq: Four times a day (QID) | ORAL | Status: AC

## 2011-06-02 MED ORDER — OXYCODONE-ACETAMINOPHEN 5-325 MG PO TABS
1.0000 | ORAL_TABLET | ORAL | Status: DC | PRN
  Administered 2011-06-02: 1 via ORAL

## 2011-06-02 MED ORDER — FENTANYL CITRATE 0.05 MG/ML IJ SOLN: 25.0000 ug | INTRAMUSCULAR | Status: DC | PRN

## 2011-06-02 MED ORDER — PROMETHAZINE HCL 25 MG/ML IJ SOLN: 6.2500 mg | INTRAMUSCULAR | Status: DC | PRN

## 2011-06-02 MED ORDER — BUPIVACAINE-EPINEPHRINE 0.25% -1:200000 IJ SOLN
INTRAMUSCULAR | Status: DC | PRN
  Administered 2011-06-02: 20 mL

## 2011-06-02 MED ORDER — DEXAMETHASONE SODIUM PHOSPHATE 4 MG/ML IJ SOLN
INTRAMUSCULAR | Status: DC | PRN
  Administered 2011-06-02: 10 mg via INTRAVENOUS

## 2011-06-02 MED ORDER — FENTANYL CITRATE 0.05 MG/ML IJ SOLN
INTRAMUSCULAR | Status: DC | PRN
  Administered 2011-06-02: 100 ug via INTRAVENOUS

## 2011-06-02 MED ORDER — LIDOCAINE HCL (CARDIAC) 20 MG/ML IV SOLN
INTRAVENOUS | Status: DC | PRN
  Administered 2011-06-02: 100 mg via INTRAVENOUS

## 2011-06-02 MED ORDER — CEFAZOLIN SODIUM-DEXTROSE 2-3 GM-% IV SOLR
2.0000 g | INTRAVENOUS | Status: AC
  Administered 2011-06-02: 2 g via INTRAVENOUS

## 2011-06-02 MED ORDER — SODIUM CHLORIDE 0.9 % IR SOLN
Status: DC | PRN
  Administered 2011-06-02: 3000 mL

## 2011-06-02 MED ORDER — PROPOFOL 10 MG/ML IV EMUL
INTRAVENOUS | Status: DC | PRN
  Administered 2011-06-02: 300 mg via INTRAVENOUS

## 2011-06-02 MED ORDER — ONDANSETRON HCL 4 MG/2ML IJ SOLN
INTRAMUSCULAR | Status: DC | PRN
  Administered 2011-06-02: 4 mg via INTRAVENOUS

## 2011-06-02 MED ORDER — OXYCODONE-ACETAMINOPHEN 5-325 MG PO TABS: 1.0000 | ORAL_TABLET | ORAL | Status: AC | PRN

## 2011-06-02 SURGICAL SUPPLY — 36 items
BANDAGE ELASTIC 6 VELCRO ST LF (GAUZE/BANDAGES/DRESSINGS) ×2 IMPLANT
BLADE 4.2CUDA (BLADE) ×2 IMPLANT
BLADE CUDA SHAVER 3.5 (BLADE) IMPLANT
BLADE CUTTER GATOR 3.5 (BLADE) IMPLANT
CANISTER SUCT LVC 12 LTR MEDI- (MISCELLANEOUS) ×2 IMPLANT
CANISTER SUCTION 2500CC (MISCELLANEOUS) IMPLANT
CLOTH BEACON ORANGE TIMEOUT ST (SAFETY) ×2 IMPLANT
DRAPE ARTHROSCOPY W/POUCH 114 (DRAPES) ×2 IMPLANT
DRSG EMULSION OIL 3X3 NADH (GAUZE/BANDAGES/DRESSINGS) ×2 IMPLANT
DRSG PAD ABDOMINAL 8X10 ST (GAUZE/BANDAGES/DRESSINGS) ×1 IMPLANT
DURAPREP 26ML APPLICATOR (WOUND CARE) ×2 IMPLANT
ELECT MENISCUS 165MM 90D (ELECTRODE) IMPLANT
ELECT REM PT RETURN 9FT ADLT (ELECTROSURGICAL)
ELECTRODE REM PT RTRN 9FT ADLT (ELECTROSURGICAL) IMPLANT
GLOVE BIO SURGEON STRL SZ 6.5 (GLOVE) ×1 IMPLANT
GLOVE BIO SURGEON STRL SZ8 (GLOVE) ×2 IMPLANT
GLOVE ECLIPSE 6.5 STRL STRAW (GLOVE) ×1 IMPLANT
GLOVE INDICATOR 8.0 STRL GRN (GLOVE) ×2 IMPLANT
GOWN PREVENTION PLUS LG XLONG (DISPOSABLE) ×4 IMPLANT
GOWN STRL NON-REIN LRG LVL3 (GOWN DISPOSABLE) ×1 IMPLANT
IV NS IRRIG 3000ML ARTHROMATIC (IV SOLUTION) ×4 IMPLANT
KNEE WRAP E Z 3 GEL PACK (MISCELLANEOUS) ×2 IMPLANT
PACK ARTHROSCOPY DSU (CUSTOM PROCEDURE TRAY) ×2 IMPLANT
PACK BASIN DAY SURGERY FS (CUSTOM PROCEDURE TRAY) ×2 IMPLANT
PADDING CAST ABS 4INX4YD NS (CAST SUPPLIES) ×1
PADDING CAST ABS COTTON 4X4 ST (CAST SUPPLIES) ×1 IMPLANT
PADDING CAST COTTON 6X4 STRL (CAST SUPPLIES) ×2 IMPLANT
PENCIL BUTTON HOLSTER BLD 10FT (ELECTRODE) IMPLANT
SET ARTHROSCOPY TUBING (MISCELLANEOUS) ×2
SET ARTHROSCOPY TUBING LN (MISCELLANEOUS) ×1 IMPLANT
SPONGE GAUZE 4X4 12PLY (GAUZE/BANDAGES/DRESSINGS) ×2 IMPLANT
SUT ETHILON 4 0 PS 2 18 (SUTURE) ×2 IMPLANT
TOWEL OR 17X24 6PK STRL BLUE (TOWEL DISPOSABLE) ×2 IMPLANT
WAND 30 DEG SABER W/CORD (SURGICAL WAND) IMPLANT
WAND 90 DEG TURBOVAC W/CORD (SURGICAL WAND) ×1 IMPLANT
WATER STERILE IRR 500ML POUR (IV SOLUTION) ×2 IMPLANT

## 2011-06-02 NOTE — Interval H&P Note (Signed)
History and Physical Interval Note:  06/02/2011 3:01 PM  Christopher Baker  has presented today for surgery, with the diagnosis of LEFT KNEE LATERAL MENISCAL TEAR  The various methods of treatment have been discussed with the patient and family. After consideration of risks, benefits and other options for treatment, the patient has consented to  Procedure(s): ARTHROSCOPY KNEE as a surgical intervention .  The patients' history has been reviewed, patient examined, no change in status, stable for surgery.  I have reviewed the patients' chart and labs.  Questions were answered to the patient's satisfaction.     Loanne Drilling

## 2011-06-02 NOTE — Transfer of Care (Signed)
Immediate Anesthesia Transfer of Care Note  Patient: Christopher Baker  Procedure(s) Performed:  ARTHROSCOPY KNEE - LEFT KNEE ARTHROSCOPY WITH DEBRIDEMENT  Patient Location: PACU  Anesthesia Type: General  Level of Consciousness: awake, alert  and oriented  Airway & Oxygen Therapy: Patient Spontanous Breathing and Patient connected to face mask oxygen  Post-op Assessment: Report given to PACU RN and Post -op Vital signs reviewed and stable  Post vital signs: Reviewed and stable  Complications: No apparent anesthesia complications

## 2011-06-02 NOTE — Anesthesia Postprocedure Evaluation (Signed)
  Anesthesia Post-op Note  Patient: Christopher Baker  Procedure(s) Performed:  ARTHROSCOPY KNEE - LEFT KNEE ARTHROSCOPY WITH DEBRIDEMENT  Patient Location: PACU  Anesthesia Type: General  Level of Consciousness: awake and alert   Airway and Oxygen Therapy: Patient Spontanous Breathing  Post-op Pain: mild  Post-op Assessment: Post-op Vital signs reviewed, Patient's Cardiovascular Status Stable, Respiratory Function Stable, Patent Airway and No signs of Nausea or vomiting  Post-op Vital Signs: stable  Complications: No apparent anesthesia complications

## 2011-06-02 NOTE — Anesthesia Preprocedure Evaluation (Signed)
Anesthesia Evaluation  Patient identified by MRN, date of birth, ID band Patient awake    Reviewed: Allergy & Precautions, H&P , NPO status , Patient's Chart, lab work & pertinent test results, reviewed documented beta blocker date and time   Airway Mallampati: II TM Distance: >3 FB Neck ROM: Full    Dental  (+) Caps   Pulmonary sleep apnea ,  Non-compliant w/ CPAP clear to auscultation        Cardiovascular hypertension, Pt. on medications + dysrhythmias Atrial Fibrillation Regular Normal S/p ablation for tachyarrhymia SSS, pacemaker insertion Denies hx of CAD   Neuro/Psych Negative Neurological ROS  Negative Psych ROS   GI/Hepatic negative GI ROS, Neg liver ROS,   Endo/Other  Negative Endocrine ROS  Renal/GU negative Renal ROS  Genitourinary negative   Musculoskeletal  (+) Arthritis -, on steriods ,    Abdominal   Peds negative pediatric ROS (+)  Hematology negative hematology ROS (+)   Anesthesia Other Findings   Reproductive/Obstetrics negative OB ROS                           Anesthesia Physical Anesthesia Plan  ASA: III  Anesthesia Plan: General   Post-op Pain Management:    Induction: Intravenous  Airway Management Planned: LMA  Additional Equipment:   Intra-op Plan:   Post-operative Plan: Extubation in OR  Informed Consent: I have reviewed the patients History and Physical, chart, labs and discussed the procedure including the risks, benefits and alternatives for the proposed anesthesia with the patient or authorized representative who has indicated his/her understanding and acceptance.   Dental advisory given  Plan Discussed with: CRNA and Surgeon  Anesthesia Plan Comments:         Anesthesia Quick Evaluation

## 2011-06-02 NOTE — Op Note (Signed)
Preoperative diagnosis-  Left knee medial meniscal tear  Postoperative diagnosis Left- knee medial meniscal tear     Procedure- Left knee arthroscopy with medial  Meniscal debridement    Surgeon- Gus Rankin. Janne Faulk, MD  Anesthesia-General  EBL-  minimal Complications- None  Condition- PACU - hemodynamically stable.  Brief clinical note- -Christopher Baker is a 77 y.o.  male with a several month history of left knee pain and mechanical symptoms. Exam and history suggested medial meniscal tear confirmed by MRI. The patient presents now for arthroscopy and debridement  Procedure in detail -       After successful administration of General anesthetic, a tourmiquet is placed high on the Left  thigh and the Left lower extremity is prepped and draped in the usual sterile fashion. Time out is performed by the surgical team. Standard superomedial and inferolateral portal sites are marked and incisions made with an 11 blade. The inflow cannula is passed through the superomedial portal and camera through the inferolateral portal and inflow is initiated. Arthroscopic visualization proceeds.      The undersurface of the patella and trochlea are visualized and there is Grade II chondromalacia of both surfaces with scattered areas of grade III but no unstable cartilage lesions. The medial and lateral gutters are visualized and there are  no loose bodies. Flexion and valgus force is applied to the knee and the medial compartment is entered. A spinal needle is passed into the joint through the site marked for the inferomedial portal. A small incision is made and the dilator passed into the joint. The findings for the medial compartment are exposed bone medial femoral condyle and tibial plateau, each approximately 1 x 2 cm areas, with a degenerative tear of the medial meniscus . The tear is debrided to a stable base with baskets and a shaver and sealed off with the Arthrocare. It is probed and found to be stable.   The intercondylar notch is visualized and the ACL appears normal. The lateral compartment is entered and the findings are normal .      The joint is again inspected and there are no other tears, defects or loose bodies identified. The arthroscopic equipment is then removed from the inferior portals which are closed with interrupted 4-0 nylon. 20 ml of .25% Marcaine with epinephrine are injected through the inflow cannula and the cannula is then removed and the portal closed with nylon. The incisions are cleaned and dried and a bulky sterile dressing is applied. The patient is then awakened and transported to recovery in stable condition.   06/02/2011, 3:44 PM

## 2011-06-02 NOTE — Anesthesia Procedure Notes (Signed)
Procedure Name: LMA Insertion Date/Time: 06/02/2011 3:07 PM Performed by: Huel Coventry Pre-anesthesia Checklist: Patient identified, Emergency Drugs available, Suction available and Patient being monitored Patient Re-evaluated:Patient Re-evaluated prior to inductionOxygen Delivery Method: Circle System Utilized Preoxygenation: Pre-oxygenation with 100% oxygen Intubation Type: IV induction Ventilation: Mask ventilation without difficulty LMA: LMA with gastric port inserted LMA Size: 5.0 Number of attempts: 1 Placement Confirmation: positive ETCO2 Tube secured with: Tape Dental Injury: Teeth and Oropharynx as per pre-operative assessment

## 2011-06-03 ENCOUNTER — Encounter (HOSPITAL_BASED_OUTPATIENT_CLINIC_OR_DEPARTMENT_OTHER): Payer: Self-pay | Admitting: Orthopedic Surgery

## 2011-06-08 ENCOUNTER — Other Ambulatory Visit: Payer: Self-pay | Admitting: Internal Medicine

## 2011-06-19 ENCOUNTER — Other Ambulatory Visit: Payer: Self-pay | Admitting: Internal Medicine

## 2011-06-21 ENCOUNTER — Encounter: Payer: Self-pay | Admitting: Internal Medicine

## 2011-06-29 ENCOUNTER — Other Ambulatory Visit: Payer: Self-pay | Admitting: Internal Medicine

## 2011-07-04 ENCOUNTER — Other Ambulatory Visit: Payer: Self-pay | Admitting: Internal Medicine

## 2011-07-16 ENCOUNTER — Other Ambulatory Visit: Payer: 59

## 2011-07-26 ENCOUNTER — Ambulatory Visit: Payer: 59 | Admitting: Internal Medicine

## 2011-07-27 ENCOUNTER — Encounter: Payer: Self-pay | Admitting: Internal Medicine

## 2011-07-27 ENCOUNTER — Ambulatory Visit (INDEPENDENT_AMBULATORY_CARE_PROVIDER_SITE_OTHER): Payer: Medicare Other | Admitting: Internal Medicine

## 2011-07-27 DIAGNOSIS — D649 Anemia, unspecified: Secondary | ICD-10-CM

## 2011-07-27 DIAGNOSIS — E785 Hyperlipidemia, unspecified: Secondary | ICD-10-CM

## 2011-07-27 DIAGNOSIS — M25562 Pain in left knee: Secondary | ICD-10-CM

## 2011-07-27 DIAGNOSIS — K219 Gastro-esophageal reflux disease without esophagitis: Secondary | ICD-10-CM

## 2011-07-27 DIAGNOSIS — I10 Essential (primary) hypertension: Secondary | ICD-10-CM

## 2011-07-27 DIAGNOSIS — M25569 Pain in unspecified knee: Secondary | ICD-10-CM

## 2011-07-27 MED ORDER — DICLOFENAC SODIUM 1 % TD GEL: 1.0000 "application " | Freq: Four times a day (QID) | TRANSDERMAL | Status: DC

## 2011-07-27 MED ORDER — FAMOTIDINE 20 MG PO TABS: 20.0000 mg | ORAL_TABLET | Freq: Every day | ORAL | Status: DC

## 2011-07-27 NOTE — Patient Instructions (Signed)
Ice the knee three to four  times a day for 10 min

## 2011-07-27 NOTE — Progress Notes (Signed)
Subjective:    Patient ID: Christopher Baker, male    DOB: [birth year], 78 y.o.   MRN: [id number]  HPI Patient is a 78 year old white male who presents with swelling of his left knee following arthroscopy 2 months ago.  He has a history of osteoarthritis also history of psoriasis with inflammatory arthritis.  He states that he has increasing GERD symptoms and is taking the Protonix twice daily.  He has hypertension is stable his current medications denies any chest pain shortness of breath PND or orthopnea   Review of Systems  Constitutional: Negative for fever and fatigue.  HENT: Negative for hearing loss, congestion, neck pain and postnasal drip.   Eyes: Negative for discharge, redness and visual disturbance.  Respiratory: Negative for cough, shortness of breath and wheezing.   Cardiovascular: Positive for palpitations. Negative for leg swelling.  Gastrointestinal: Negative for abdominal pain, constipation and abdominal distention.  Genitourinary: Negative for urgency and frequency.  Musculoskeletal: Positive for joint swelling, arthralgias and gait problem.  Skin: Negative for color change and rash.  Neurological: Negative for weakness and light-headedness.  Hematological: Negative for adenopathy.  Psychiatric/Behavioral: Negative for behavioral problems.   Past Medical History  Diagnosis Date  . Hyperlipidemia   . HTN (hypertension)   . Status post placement of cardiac pacemaker DDD-  06-04-10-- LAST CHECK 09-08-10 IN EPIC    SECONDARY TO SYNCOPY AND BRADYCARDIA  . Restless leg syndrome   . Normal nuclear stress test 2008    LOW RISK--   DR  WALL  . Carotid stenosis, bilateral PER DR EARLY / DUPLEX  09-09-10      40 - 59%   BILATERALLY--- ASYMPTOMATIC  . History of echocardiogram 11-21-2006    EF 65%  . History of prostate cancer S/P RADIATION  [place]  6 YRS AGO  . Psoriasis   . Arthritis with psoriasis   . Sleep apnea TESTED YRS AGO-- NO CPAP RX GIVEN  . GERD  (gastroesophageal reflux disease)     CONTROLLED W/ PROTONIX  . Borderline diabetes     DIET CONTROLLED  . Lumbar spondylosis W/ RADICULOPATHY  . Iron deficiency   . Cataract immature LEFT EYE  . PAF (paroxysmal atrial fibrillation)   . SVT (supraventricular tachycardia)     S/P ABLATION   2008  . Coronary artery disease CARDIOLOGIST- DR Sharrell Ku    09-08-10 VISIT AND PACEMAKER CHECK  IN EPIC    History   Social History  . Marital Status: Married    Spouse Name: N/A    Number of Children: N/A  . Years of Education: N/A   Occupational History  . retired    Social History Main Topics  . Smoking status: Never Smoker   . Smokeless tobacco: Never Used  . Alcohol Use: Yes     RARE  . Drug Use: No  . Sexually Active: Yes   Other Topics Concern  . Not on file   Social History Narrative  . No narrative on file    Past Surgical History  Procedure Date  . Replacement total knee 1997    RIGHT  . Cardiac pacemaker placement 06-04-10    DDD/ AND REMOVAL LOOR RECORDER  . Loop recorder placement 01-30-10  . Lumbar laminectomy/ diskectomy/ fusion 05-19-10    L4 - 5  . Cholecystectomy 2009  . Cardiac electrophysiology study and ablation 2008    FOR SVT  . Prostate biopsy 2007  . Transurethral resection of prostate 2006  . Inguinal hernia repair  2005    LEFT/   DONE WITH PENILE PROSTESIS SURG.  . Removal and placement penile prosthesis implant  2005    AND LEFT CORPOROPLASTY /    DONE INGUINAL REPAIR  . Penile prosthesis implant 1995  . Knee arthroscopy 06/02/2011    Procedure: ARTHROSCOPY KNEE;  Surgeon: Loanne Drilling;  Location: [facility];  Service: Orthopedics;  Laterality: Left;  LEFT KNEE ARTHROSCOPY WITH DEBRIDEMENT    Family History  Problem Relation Age of Onset  . Stroke Mother   . Early death Father     Allergies  Allergen Reactions  . Ciprofloxacin     REACTION: GI upset    Current Outpatient Prescriptions on File Prior to Visit   Medication Sig Dispense Refill  . amLODipine (NORVASC) 5 MG tablet TAKE ONE TABLET BY MOUTH DAILY  90 tablet  1  . atorvastatin (LIPITOR) 20 MG tablet Take 20 mg by mouth every evening.        . ezetimibe (ZETIA) 10 MG tablet Take 1 tablet (10 mg total) by mouth daily.  30 tablet  9  . iron polysaccharides (FERREX 150) 150 MG capsule Take 1 capsule (150 mg total) by mouth 2 (two) times daily.  60 capsule  11  . nabumetone (RELAFEN) 750 MG tablet TAKE TWO TABLETS DAILY  60 tablet  2  . OXYBUTYNIN CHLORIDE PO Take by mouth 2 (two) times daily.       . pantoprazole (PROTONIX) 40 MG tablet TAKE ONE TABLET BY MOUTH TWICE DAILY  60 tablet  6  . predniSONE (DELTASONE) 5 MG tablet TAKE 1 TABLET BY MOUTH ONCE A DAY  30 tablet  3  . rOPINIRole (REQUIP) 2 MG tablet TAKE ONE TABLET BY MOUTH DAILY  30 tablet  3  . triamcinolone (KENALOG) 0.1 % lotion Apply 1 application topically as needed. As ditrected      . vitamin C (ASCORBIC ACID) 500 MG tablet Take 500 mg by mouth daily.       . Vitamin D, Ergocalciferol, (DRISDOL) 50000 UNITS CAPS TAKE ONE CAPSULE BY MOUTH WEEKLY  5 capsule  2    BP 140/80  Pulse 64  Temp 98.2 F (36.8 C)  Resp 16  Ht 6' 3.5" (1.918 m)  Wt 236 lb (107.049 kg)  BMI 29.11 kg/m2       Objective:   Physical Exam  Constitutional: He appears well-developed and well-nourished.  HENT:  Head: Normocephalic and atraumatic.  Eyes: Conjunctivae are normal. Pupils are equal, round, and reactive to light.  Neck: Normal range of motion. Neck supple.  Cardiovascular: Regular rhythm.        Trigeminy  Pulmonary/Chest: Effort normal and breath sounds normal.  Abdominal: Soft. Bowel sounds are normal.  Musculoskeletal: He exhibits edema and tenderness.          Assessment & Plan:  Patient has osteoarthritis of his knee his left knee he is status post arthroscopy with continued swelling and pain.  There is some consideration for Synvisc injections but he has not been faithful  with icing the knee and not use any topical anti-inflammatories we gave him a prescription for Voltaren gel to apply 3-4 times a day with ice treatments 3 or 4 times a day for the next several weeks to see if we can alleviate the inflammation cycle is causing the swelling in the knee and see if he can get adequate pain control the pain right now is very intermittent and seemingly related to activity  If this fails going with the Synvisc injection would be the next step His blood pressure is stable his current medications Measure a CBC with differential and an iron level for his iron deficiency anemia history and his iron supplementation history.

## 2011-07-28 LAB — CBC WITH DIFFERENTIAL/PLATELET
Basophils Relative: 0.1 % (ref 0.0–3.0)
Eosinophils Relative: 0.1 % (ref 0.0–5.0)
Lymphocytes Relative: 5.9 % — ABNORMAL LOW (ref 12.0–46.0)
MCV: 99.2 fl (ref 78.0–100.0)
Monocytes Relative: 5.5 % (ref 3.0–12.0)
Neutrophils Relative %: 88.4 % — ABNORMAL HIGH (ref 43.0–77.0)
Platelets: 229 10*3/uL (ref 150.0–400.0)
RBC: 4.13 Mil/uL — ABNORMAL LOW (ref 4.22–5.81)
WBC: 10.7 10*3/uL — ABNORMAL HIGH (ref 4.5–10.5)

## 2011-07-28 LAB — IRON: Iron: 53 ug/dL (ref 42–165)

## 2011-07-29 ENCOUNTER — Other Ambulatory Visit: Payer: Self-pay | Admitting: Internal Medicine

## 2011-07-29 NOTE — Progress Notes (Signed)
Pt informed

## 2011-08-19 ENCOUNTER — Other Ambulatory Visit: Payer: Self-pay | Admitting: Internal Medicine

## 2011-09-10 ENCOUNTER — Ambulatory Visit (INDEPENDENT_AMBULATORY_CARE_PROVIDER_SITE_OTHER): Payer: Medicare Other | Admitting: Internal Medicine

## 2011-09-10 ENCOUNTER — Encounter: Payer: Self-pay | Admitting: Internal Medicine

## 2011-09-10 VITALS — BP 130/80 | HR 72 | Temp 98.0°F | Resp 16 | Ht 74.0 in | Wt 236.0 lb

## 2011-09-10 DIAGNOSIS — R509 Fever, unspecified: Secondary | ICD-10-CM

## 2011-09-10 DIAGNOSIS — Z79899 Other long term (current) drug therapy: Secondary | ICD-10-CM | POA: Insufficient documentation

## 2011-09-10 DIAGNOSIS — Z79631 Long term (current) use of antimetabolite agent: Secondary | ICD-10-CM

## 2011-09-10 DIAGNOSIS — T887XXA Unspecified adverse effect of drug or medicament, initial encounter: Secondary | ICD-10-CM

## 2011-09-10 DIAGNOSIS — D509 Iron deficiency anemia, unspecified: Secondary | ICD-10-CM

## 2011-09-10 DIAGNOSIS — L409 Psoriasis, unspecified: Secondary | ICD-10-CM

## 2011-09-10 DIAGNOSIS — L408 Other psoriasis: Secondary | ICD-10-CM

## 2011-09-10 MED ORDER — DOXYCYCLINE HYCLATE 100 MG PO TABS: 100.0000 mg | ORAL_TABLET | Freq: Two times a day (BID) | ORAL | Status: AC

## 2011-09-10 NOTE — Progress Notes (Signed)
  Subjective:    Patient ID: Christopher Baker, male    DOB: 09/21/1933, 78 y.o.   MRN: [id number]  HPI  Has been noting fever and chills at night. No fever noted during the day. Had three shots in the knee ( synvisc shots) and now the knee is finally feeling better ( 2 weeks post) Knee has not been red but feels warm. NO effusion. No urination issues or burning. No tick bites.   Review of Systems  Constitutional: Positive for fever, chills and fatigue.  HENT: Negative for hearing loss, congestion, neck pain and postnasal drip.   Eyes: Negative for discharge, redness and visual disturbance.  Respiratory: Negative for cough, shortness of breath and wheezing.   Cardiovascular: Negative for leg swelling.  Gastrointestinal: Negative for abdominal pain, constipation and abdominal distention.  Genitourinary: Negative for urgency and frequency.  Musculoskeletal: Negative for joint swelling and arthralgias.  Skin: Negative for color change and rash.  Neurological: Negative for weakness and light-headedness.  Hematological: Negative for adenopathy.  Psychiatric/Behavioral: Negative for behavioral problems.       Objective:   Physical Exam  Nursing note and vitals reviewed. Constitutional: He appears well-developed and well-nourished.  HENT:  Head: Normocephalic and atraumatic.  Eyes: Conjunctivae are normal. Pupils are equal, round, and reactive to light.  Neck: Normal range of motion. Neck supple.  Cardiovascular: Normal rate and regular rhythm.   Pulmonary/Chest: Effort normal and breath sounds normal. No respiratory distress. He has no wheezes. He has no rales. He exhibits no tenderness.  Abdominal: Soft. Bowel sounds are normal.  Genitourinary: Prostate normal.  Skin: Skin is warm and dry.          Assessment & Plan:  Patient is multiple risk factors for complications 22 infection in that he has artificial joints a pacemaker and is on chronic immunosuppression for psoriasis. Due  to his history of night sweats and fever with no apparent cause other than possible mild prostatitis will place him on doxycycline 100 mg by mouth twice a day for 21 days. He will report back to [place] if his fevers increase if so then blood cultures and echocardiogram would be part of the continuing workup.

## 2011-09-10 NOTE — Patient Instructions (Addendum)
The patient is instructed to continue all medications as prescribed. Schedule followup with check out clerk upon leaving the clinic Bendryl before bed

## 2011-09-11 LAB — CBC WITH DIFFERENTIAL/PLATELET
Eosinophils Absolute: 0.2 10*3/uL (ref 0.0–0.7)
Eosinophils Relative: 2 % (ref 0–5)
HCT: 41.3 % (ref 39.0–52.0)
Hemoglobin: 12.9 g/dL — ABNORMAL LOW (ref 13.0–17.0)
Lymphocytes Relative: 9 % — ABNORMAL LOW (ref 12–46)
Lymphs Abs: 0.7 10*3/uL (ref 0.7–4.0)
MCH: 30.9 pg (ref 26.0–34.0)
MCV: 99 fL (ref 78.0–100.0)
Monocytes Absolute: 0.9 10*3/uL (ref 0.1–1.0)
Monocytes Relative: 12 % (ref 3–12)
Platelets: 264 10*3/uL (ref 150–400)
RBC: 4.17 MIL/uL — ABNORMAL LOW (ref 4.22–5.81)
WBC: 7.8 10*3/uL (ref 4.0–10.5)

## 2011-09-11 LAB — HEPATIC FUNCTION PANEL
AST: 29 U/L (ref 0–37)
Alkaline Phosphatase: 94 U/L (ref 39–117)
Indirect Bilirubin: 0.4 mg/dL (ref 0.0–0.9)
Total Protein: 5.9 g/dL — ABNORMAL LOW (ref 6.0–8.3)

## 2011-09-14 ENCOUNTER — Encounter: Payer: Self-pay | Admitting: Internal Medicine

## 2011-09-16 ENCOUNTER — Ambulatory Visit (INDEPENDENT_AMBULATORY_CARE_PROVIDER_SITE_OTHER): Payer: Medicare Other | Admitting: *Deleted

## 2011-09-16 DIAGNOSIS — I6529 Occlusion and stenosis of unspecified carotid artery: Secondary | ICD-10-CM

## 2011-09-22 ENCOUNTER — Encounter: Payer: Self-pay | Admitting: Vascular Surgery

## 2011-09-22 ENCOUNTER — Other Ambulatory Visit: Payer: Self-pay | Admitting: *Deleted

## 2011-09-22 DIAGNOSIS — I6529 Occlusion and stenosis of unspecified carotid artery: Secondary | ICD-10-CM

## 2011-09-22 NOTE — Procedures (Unsigned)
CAROTID DUPLEX EXAM  INDICATION:  Follow up carotid disease.  HISTORY: Diabetes:  No. Cardiac:  No. Hypertension:  No. Smoking:  No. Previous Surgery:  No. CV History: Amaurosis Fugax No, Paresthesias No, Hemiparesis No                                      RIGHT             LEFT Brachial systolic pressure:         142               152 Brachial Doppler waveforms:         WNL               WNL Vertebral direction of flow:        Antegrade         Antegrade DUPLEX VELOCITIES (cm/sec) CCA peak systolic                   71                75 ECA peak systolic                   120               124 ICA peak systolic                   150 (M)           186 (M) ICA end diastolic                   34 (M)            54 (M) PLAQUE MORPHOLOGY:                  Heterogeneous     Heterogeneous PLAQUE AMOUNT:                      Mild              Mild PLAQUE LOCATION:                    CCA/ECA/ICA       CCA/ECA/ICA   IMPRESSION: 1. 40-59% bilateral internal carotid artery stenosis. 2. Bilateral vertebral arteries are within normal limits.       ___________________________________________ Larina Earthly, M.D.  LT/MEDQ  D:  09/16/2011  T:  09/16/2011  Job:  [id number]

## 2011-09-29 ENCOUNTER — Telehealth: Payer: Self-pay | Admitting: *Deleted

## 2011-09-29 NOTE — Telephone Encounter (Signed)
Dr Lovell Sheehan will sign sheet when it arrives

## 2011-09-29 NOTE — Telephone Encounter (Signed)
Pt. Needs a medical clearance from Dr. Lovell Sheehan for knee surgery and fax to [facility].  [phone]  Dr. Lequita Halt is doing the surgery.

## 2011-10-10 ENCOUNTER — Other Ambulatory Visit: Payer: Self-pay | Admitting: Orthopedic Surgery

## 2011-10-10 MED ORDER — BUPIVACAINE 0.25 % ON-Q PUMP SINGLE CATH 300ML: 300.0000 mL | INJECTION | Status: DC

## 2011-10-10 MED ORDER — DEXAMETHASONE SODIUM PHOSPHATE 10 MG/ML IJ SOLN: 10.0000 mg | Freq: Once | INTRAMUSCULAR | Status: DC

## 2011-10-13 ENCOUNTER — Other Ambulatory Visit: Payer: Self-pay | Admitting: Internal Medicine

## 2011-10-23 ENCOUNTER — Other Ambulatory Visit: Payer: Self-pay | Admitting: Internal Medicine

## 2011-11-03 ENCOUNTER — Other Ambulatory Visit: Payer: Self-pay | Admitting: Internal Medicine

## 2011-11-15 ENCOUNTER — Other Ambulatory Visit: Payer: Self-pay | Admitting: Internal Medicine

## 2011-11-18 ENCOUNTER — Ambulatory Visit (INDEPENDENT_AMBULATORY_CARE_PROVIDER_SITE_OTHER): Payer: Medicare Other | Admitting: Internal Medicine

## 2011-11-18 ENCOUNTER — Telehealth: Payer: Self-pay | Admitting: Family Medicine

## 2011-11-18 ENCOUNTER — Encounter: Payer: Self-pay | Admitting: Internal Medicine

## 2011-11-18 VITALS — BP 134/68 | HR 81 | Ht 75.0 in | Wt 236.0 lb

## 2011-11-18 DIAGNOSIS — I4891 Unspecified atrial fibrillation: Secondary | ICD-10-CM

## 2011-11-18 DIAGNOSIS — Z950 Presence of cardiac pacemaker: Secondary | ICD-10-CM

## 2011-11-18 LAB — PACEMAKER DEVICE OBSERVATION
AL IMPEDENCE PM: 439 Ohm
BATTERY VOLTAGE: 2.79 V
RV LEAD AMPLITUDE: 8 mv

## 2011-11-18 NOTE — Telephone Encounter (Signed)
Pulled from Triage vmail - pt called at 11:14. He is having TKR with Dr. Despina Hick on 7/1. He is calling for 2 reasons: he wants to know if him being on Methotrexate has contraindications where surgery is concerned. Also, Dr Despina Hick is requiring Dr Lovell Sheehan to clear the pt for the TKR surgery, either by letter or appt & letter. Please advise. Thanks.

## 2011-11-18 NOTE — Assessment & Plan Note (Signed)
His device is working normally. We'll plan to recheck in several months. Approximately 12 years of battery longevity is present.

## 2011-11-18 NOTE — Telephone Encounter (Signed)
Left message on machine Ok to take methotrexate-no contraindications- have dr Gerri Spore send [place] clearance form and dr Lovell Sheehan will sign

## 2011-11-18 NOTE — Patient Instructions (Signed)
Your physician wants you to follow-up in: 12 months with Dr. Taylor. You will receive a reminder letter in the mail two months in advance. If you don't receive a letter, please call our office to schedule the follow-up appointment.    

## 2011-11-18 NOTE — Progress Notes (Signed)
HPI  [name] returns today for followup. He is a very pleasant 78 year old man with a history of SVT status post catheter ablation years ago, a history of symptomatic bradycardia, status post pacemaker insertion a year ago, as well as a history of palpitations and PVCs. His big problem is with arthritis. In the interim, he has done well  except for the need to undergo knee surgery. This is scheduled in several weeks. He denies chest pain or shortness of breath. Despite his arthritis, he walks almost every day.  Allergies  Allergen Reactions  . Ciprofloxacin     REACTION: GI upset     Current Outpatient Prescriptions  Medication Sig Dispense Refill  . amLODipine (NORVASC) 5 MG tablet TAKE ONE TABLET BY MOUTH DAILY  90 tablet  1  . atorvastatin (LIPITOR) 20 MG tablet Take 20 mg by mouth every evening.        . diclofenac sodium (VOLTAREN) 1 % GEL Apply 1 application topically as needed.      . estazolam (PROSOM) 2 MG tablet TAKE ONE TABLET BY MOUTH AT BEDTIME AS NEEDED FOR INSOMNIA  30 tablet  2  . famotidine (PEPCID) 20 MG tablet Take 20 mg by mouth at bedtime as needed.      . iron polysaccharides (FERREX 150) 150 MG capsule Take 1 capsule (150 mg total) by mouth 2 (two) times daily.  60 capsule  11  . methotrexate (RHEUMATREX) 2.5 MG tablet Take 10 mg by mouth once a week.       . nabumetone (RELAFEN) 750 MG tablet TAKE TWO TABLETS DAILY  60 tablet  1  . OXYBUTYNIN CHLORIDE PO Take by mouth daily.       . pantoprazole (PROTONIX) 40 MG tablet TAKE ONE TABLET BY MOUTH TWICE DAILY  60 tablet  6  . predniSONE (DELTASONE) 5 MG tablet TAKE 1 TABLET BY MOUTH ONCE A DAY  30 tablet  2  . rOPINIRole (REQUIP) 2 MG tablet TAKE ONE TABLET BY MOUTH DAILY  30 tablet  1  . triamcinolone cream (KENALOG) 0.1 % APPLY TO AFFECTED AREAS AS DIRECTED  480 g  3  . ZETIA 10 MG tablet TAKE ONE TABLET BY MOUTH DAILY  30 tablet  8  . DISCONTD: famotidine (PEPCID) 20 MG tablet Take 1 tablet (20 mg total) by mouth  at bedtime.         Past Medical History  Diagnosis Date  . Hyperlipidemia   . HTN (hypertension)   . Status post placement of cardiac pacemaker DDD-  06-04-10-- LAST CHECK 09-08-10 IN EPIC    SECONDARY TO SYNCOPY AND BRADYCARDIA  . Restless leg syndrome   . Normal nuclear stress test 2008    LOW RISK--   DR  WALL  . Carotid stenosis, bilateral PER DR EARLY / DUPLEX  09-09-10      40 - 59%   BILATERALLY--- ASYMPTOMATIC  . History of echocardiogram 11-21-2006    EF 65%  . History of prostate cancer S/P RADIATION  [place]  6 YRS AGO  . Psoriasis   . Arthritis with psoriasis   . Sleep apnea TESTED YRS AGO-- NO CPAP RX GIVEN  . GERD (gastroesophageal reflux disease)     CONTROLLED W/ PROTONIX  . Borderline diabetes     DIET CONTROLLED  . Lumbar spondylosis W/ RADICULOPATHY  . Iron deficiency   . Cataract immature LEFT EYE  . PAF (paroxysmal atrial fibrillation)   . SVT (supraventricular tachycardia)  S/P ABLATION   2008  . Coronary artery disease CARDIOLOGIST- DR Sharrell Ku    09-08-10 VISIT AND PACEMAKER CHECK  IN EPIC    ROS:   All systems reviewed and negative except as noted in the HPI.   Past Surgical History  Procedure Date  . Replacement total knee 1997    RIGHT  . Cardiac pacemaker placement 06-04-10    DDD/ AND REMOVAL LOOR RECORDER  . Loop recorder placement 01-30-10  . Lumbar laminectomy/ diskectomy/ fusion 05-19-10    L4 - 5  . Cholecystectomy 2009  . Cardiac electrophysiology study and ablation 2008    FOR SVT  . Prostate biopsy 2007  . Transurethral resection of prostate 2006  . Inguinal hernia repair 2005    LEFT/   DONE WITH PENILE PROSTESIS SURG.  . Removal and placement penile prosthesis implant  2005    AND LEFT CORPOROPLASTY /    DONE INGUINAL REPAIR  . Penile prosthesis implant 1995  . Knee arthroscopy 06/02/2011    Procedure: ARTHROSCOPY KNEE;  Surgeon: Loanne Drilling;  Location: [facility];  Service: Orthopedics;   Laterality: Left;  LEFT KNEE ARTHROSCOPY WITH DEBRIDEMENT     Family History  Problem Relation Age of Onset  . Stroke Mother   . Early death Father      History   Social History  . Marital Status: Married    Spouse Name: N/A    Number of Children: N/A  . Years of Education: N/A   Occupational History  . retired    Social History Main Topics  . Smoking status: Current Everyday Smoker -- 0.1 packs/day for 55 years    Types: Cigarettes  . Smokeless tobacco: Never Used  . Alcohol Use: Yes     RARE  . Drug Use: No  . Sexually Active: Yes   Other Topics Concern  . Not on file   Social History Narrative  . No narrative on file     BP 134/68  Pulse 81  Ht 6\' 3"  (1.905 m)  Wt 236 lb (107.049 kg)  BMI 29.50 kg/m2  Physical Exam:  Well appearing 78 year old man, NAD HEENT: Unremarkable Neck:  No JVD, no thyromegally Lungs:  Clear with no wheezes, rales, or rhonchi. Well-healed pacemaker incision. HEART:  Regular rate rhythm, no murmurs, no rubs, no clicks Abd:  soft, positive bowel sounds, no organomegally, no rebound, no guarding Ext:  2 plus pulses, no edema, no cyanosis, no clubbing Skin:  No rashes no nodules Neuro:  CN II through XII intact, motor grossly intact  DEVICE  Normal device function.  See PaceArt for details.   Assess/Plan:

## 2011-11-18 NOTE — Assessment & Plan Note (Signed)
He is maintaining sinus rhythm very nicely. He is in atrial fibrillation less than 0.1% of the time. He will continue his current medical therapy.

## 2011-11-29 ENCOUNTER — Other Ambulatory Visit: Payer: Self-pay | Admitting: Internal Medicine

## 2011-11-29 ENCOUNTER — Other Ambulatory Visit: Payer: Self-pay | Admitting: *Deleted

## 2011-11-29 MED ORDER — ESTAZOLAM 2 MG PO TABS: 2.0000 mg | ORAL_TABLET | Freq: Every day | ORAL | Status: DC

## 2011-12-13 ENCOUNTER — Encounter: Payer: Self-pay | Admitting: Family

## 2011-12-13 ENCOUNTER — Ambulatory Visit (INDEPENDENT_AMBULATORY_CARE_PROVIDER_SITE_OTHER): Payer: Medicare Other | Admitting: Family

## 2011-12-13 VITALS — BP 122/68 | HR 64 | Wt 240.0 lb

## 2011-12-13 DIAGNOSIS — G4700 Insomnia, unspecified: Secondary | ICD-10-CM

## 2011-12-13 DIAGNOSIS — L259 Unspecified contact dermatitis, unspecified cause: Secondary | ICD-10-CM

## 2011-12-13 DIAGNOSIS — I10 Essential (primary) hypertension: Secondary | ICD-10-CM

## 2011-12-13 DIAGNOSIS — I4891 Unspecified atrial fibrillation: Secondary | ICD-10-CM

## 2011-12-13 MED ORDER — ZOLPIDEM TARTRATE 5 MG PO TABS: 5.0000 mg | ORAL_TABLET | Freq: Every evening | ORAL | Status: DC | PRN

## 2011-12-13 MED ORDER — CLOTRIMAZOLE-BETAMETHASONE 1-0.05 % EX CREA: TOPICAL_CREAM | Freq: Two times a day (BID) | CUTANEOUS | Status: DC

## 2011-12-13 NOTE — Patient Instructions (Addendum)

## 2011-12-13 NOTE — Progress Notes (Signed)
Subjective:    Patient ID: Christopher Baker, male    DOB: [birth year], 78 y.o.   MRN: [id number]  HPI 78 year old white male, nonsmoker, patient of Dr. Lovell Sheehan is in today for surgical clearance. He is having a left knee replacement on Monday December 20, 2011. He has a history of atrial fibrillation and is asymptomatic. He has a pacemaker in place. Denies any cardiac concerns. Denies any shortness of breath, lightheadedness, dizziness, chest pain, or palpitations, has mild peripheral edema.   Patient does have concerns of insomnia. His insurance is no longer covering his ProSom. In the past he's taken temazepam to call if symptoms sleepwalk. She's also taken Lunesta and that made him extremely fatigued.   Patient also has concerns of rash to his right foot has been present for about 2 weeks. He's been soaking his feet in epsom salt that is helped. He has not applied any medications. Describes the rash as dry and flaky. Nonitching   Review of Systems  Constitutional: Negative.   HENT: Negative.   Respiratory: Negative.  Negative for shortness of breath.   Cardiovascular: Positive for leg swelling. Negative for chest pain and palpitations.       Bilateral peripheral edema.  Genitourinary: Negative.   Musculoskeletal: Negative.   Skin: Positive for rash.       Right foot  Neurological: Negative.   Hematological: Negative.   Psychiatric/Behavioral: Negative.    Past Medical History  Diagnosis Date  . Hyperlipidemia   . HTN (hypertension)   . Status post placement of cardiac pacemaker DDD-  06-04-10-- LAST CHECK 09-08-10 IN EPIC    SECONDARY TO SYNCOPY AND BRADYCARDIA  . Restless leg syndrome   . Normal nuclear stress test 2008    LOW RISK--   DR  WALL  . Carotid stenosis, bilateral PER DR EARLY / DUPLEX  09-09-10      40 - 59%   BILATERALLY--- ASYMPTOMATIC  . History of echocardiogram 11-21-2006    EF 65%  . History of prostate cancer S/P RADIATION  [place]  6 YRS AGO  . Psoriasis   .  Arthritis with psoriasis   . Sleep apnea TESTED YRS AGO-- NO CPAP RX GIVEN  . GERD (gastroesophageal reflux disease)     CONTROLLED W/ PROTONIX  . Borderline diabetes     DIET CONTROLLED  . Lumbar spondylosis W/ RADICULOPATHY  . Iron deficiency   . Cataract immature LEFT EYE  . PAF (paroxysmal atrial fibrillation)   . SVT (supraventricular tachycardia)     S/P ABLATION   2008  . Coronary artery disease CARDIOLOGIST- DR Sharrell Ku    09-08-10 VISIT AND PACEMAKER CHECK  IN EPIC    History   Social History  . Marital Status: Married    Spouse Name: N/A    Number of Children: N/A  . Years of Education: N/A   Occupational History  . retired    Social History Main Topics  . Smoking status: Current Some Day Smoker -- 0.1 packs/day for 55 years    Types: Cigarettes  . Smokeless tobacco: Never Used  . Alcohol Use: Yes     RARE  . Drug Use: No  . Sexually Active: Yes   Other Topics Concern  . Not on file   Social History Narrative  . No narrative on file    Past Surgical History  Procedure Date  . Replacement total knee 1997    RIGHT  . Cardiac pacemaker placement 06-04-10    DDD/ AND  REMOVAL LOOR RECORDER  . Loop recorder placement 01-30-10  . Lumbar laminectomy/ diskectomy/ fusion 05-19-10    L4 - 5  . Cholecystectomy 2009  . Cardiac electrophysiology study and ablation 2008    FOR SVT  . Prostate biopsy 2007  . Transurethral resection of prostate 2006  . Inguinal hernia repair 2005    LEFT/   DONE WITH PENILE PROSTESIS SURG.  . Removal and placement penile prosthesis implant  2005    AND LEFT CORPOROPLASTY /    DONE INGUINAL REPAIR  . Penile prosthesis implant 1995  . Knee arthroscopy 06/02/2011    Procedure: ARTHROSCOPY KNEE;  Surgeon: Loanne Drilling;  Location: [facility];  Service: Orthopedics;  Laterality: Left;  LEFT KNEE ARTHROSCOPY WITH DEBRIDEMENT    Family History  Problem Relation Age of Onset  . Stroke Mother   . Early death  Father     Allergies  Allergen Reactions  . Ciprofloxacin     REACTION: GI upset    Current Outpatient Prescriptions on File Prior to Visit  Medication Sig Dispense Refill  . amLODipine (NORVASC) 5 MG tablet TAKE ONE TABLET BY MOUTH DAILY  90 tablet  1  . atorvastatin (LIPITOR) 20 MG tablet TAKE ONE TABLET BY MOUTH DAILY  30 tablet  2  . diclofenac sodium (VOLTAREN) 1 % GEL Apply 1 application topically as needed.      . estazolam (PROSOM) 2 MG tablet Take 1 tablet (2 mg total) by mouth at bedtime.  30 tablet  5  . famotidine (PEPCID) 20 MG tablet Take 20 mg by mouth at bedtime as needed.      . iron polysaccharides (FERREX 150) 150 MG capsule Take 1 capsule (150 mg total) by mouth 2 (two) times daily.  60 capsule  11  . methotrexate (RHEUMATREX) 2.5 MG tablet Take 10 mg by mouth once a week.       . nabumetone (RELAFEN) 750 MG tablet TAKE TWO TABLETS DAILY  60 tablet  1  . OXYBUTYNIN CHLORIDE PO Take by mouth daily.       . pantoprazole (PROTONIX) 40 MG tablet TAKE ONE TABLET BY MOUTH TWICE DAILY  60 tablet  6  . predniSONE (DELTASONE) 5 MG tablet TAKE 1 TABLET BY MOUTH ONCE A DAY  30 tablet  2  . rOPINIRole (REQUIP) 2 MG tablet TAKE ONE TABLET BY MOUTH DAILY  30 tablet  1  . triamcinolone cream (KENALOG) 0.1 % APPLY TO AFFECTED AREAS AS DIRECTED  480 g  3  . ZETIA 10 MG tablet TAKE ONE TABLET BY MOUTH DAILY  30 tablet  8  . atorvastatin (LIPITOR) 20 MG tablet Take 20 mg by mouth every evening.        . estazolam (PROSOM) 2 MG tablet Take 1 tablet (2 mg total) by mouth at bedtime.  30 tablet  4  . zolpidem (AMBIEN) 5 MG tablet Take 1 tablet (5 mg total) by mouth at bedtime as needed for sleep.  10 tablet  0    BP 122/68  Pulse 64  Wt 240 lb (108.863 kg)  SpO2 98%chart    Objective:   Physical Exam  Constitutional: He is oriented to person, place, and time. He appears well-developed and well-nourished.  HENT:  Right Ear: External ear normal.  Left Ear: External ear normal.    Nose: Nose normal.  Mouth/Throat: Oropharynx is clear and moist.  Eyes: Conjunctivae are normal. Pupils are equal, round, and reactive to light.  Neck: Normal range of motion. Neck supple.  Cardiovascular: Normal heart sounds and intact distal pulses.        Atrial fibrillation, stable  Pulmonary/Chest: Effort normal and breath sounds normal.  Musculoskeletal: He exhibits edema.       1-2+ pitting edema noted to the lower extremities bilaterally.  Neurological: He is alert and oriented to person, place, and time.  Skin: Skin is warm and dry.  Psychiatric: He has a normal mood and affect.          Assessment & Plan:   Assessment: Insomnia, surgical clearance, peripheral edema likely related to sodium retention, contact dermatitis to the right foot  Plan: Patient will have labs drawn preoperatively. As long as his labs are stable then he is clear for surgery from medical standpoint. Decrease sodium intake. For the rash on his right foot Lotrisone cream applied to the affected area twice daily. Call the office symptoms worsen or persist. Recheck as scheduled appearing.

## 2011-12-14 ENCOUNTER — Encounter (HOSPITAL_COMMUNITY): Payer: Self-pay | Admitting: Pharmacy Technician

## 2011-12-16 ENCOUNTER — Encounter (HOSPITAL_COMMUNITY)
Admission: RE | Admit: 2011-12-16 | Discharge: 2011-12-16 | Disposition: A | Discharge status: Home / Self Care | Payer: Medicare Other | Source: Ambulatory Visit | Attending: Orthopedic Surgery | Admitting: Orthopedic Surgery

## 2011-12-16 ENCOUNTER — Ambulatory Visit (HOSPITAL_COMMUNITY)
Admission: RE | Admit: 2011-12-16 | Discharge: 2011-12-16 | Disposition: A | Discharge status: Home / Self Care | Payer: Medicare Other | Source: Ambulatory Visit | Attending: Orthopedic Surgery | Admitting: Orthopedic Surgery

## 2011-12-16 ENCOUNTER — Encounter (HOSPITAL_COMMUNITY): Payer: Self-pay

## 2011-12-16 DIAGNOSIS — Z01818 Encounter for other preprocedural examination: Secondary | ICD-10-CM | POA: Insufficient documentation

## 2011-12-16 DIAGNOSIS — Z01812 Encounter for preprocedural laboratory examination: Secondary | ICD-10-CM | POA: Insufficient documentation

## 2011-12-16 DIAGNOSIS — I4891 Unspecified atrial fibrillation: Secondary | ICD-10-CM | POA: Insufficient documentation

## 2011-12-16 DIAGNOSIS — F17200 Nicotine dependence, unspecified, uncomplicated: Secondary | ICD-10-CM | POA: Insufficient documentation

## 2011-12-16 DIAGNOSIS — E119 Type 2 diabetes mellitus without complications: Secondary | ICD-10-CM | POA: Insufficient documentation

## 2011-12-16 DIAGNOSIS — Z950 Presence of cardiac pacemaker: Secondary | ICD-10-CM | POA: Insufficient documentation

## 2011-12-16 LAB — CBC
MCHC: 33.3 g/dL (ref 30.0–36.0)
MCV: 98.8 fL (ref 78.0–100.0)
Platelets: 219 10*3/uL (ref 150–400)
RDW: 17.2 % — ABNORMAL HIGH (ref 11.5–15.5)
WBC: 6.7 10*3/uL (ref 4.0–10.5)

## 2011-12-16 LAB — URINE MICROSCOPIC-ADD ON

## 2011-12-16 LAB — COMPREHENSIVE METABOLIC PANEL
AST: 20 U/L (ref 0–37)
Albumin: 3.7 g/dL (ref 3.5–5.2)
BUN: 16 mg/dL (ref 6–23)
Chloride: 107 mEq/L (ref 96–112)
Creatinine, Ser: 1.05 mg/dL (ref 0.50–1.35)
Potassium: 4.5 mEq/L (ref 3.5–5.1)
Total Bilirubin: 0.3 mg/dL (ref 0.3–1.2)
Total Protein: 6.4 g/dL (ref 6.0–8.3)

## 2011-12-16 LAB — SURGICAL PCR SCREEN: Staphylococcus aureus: NEGATIVE

## 2011-12-16 LAB — PROTIME-INR
INR: 0.99 (ref 0.00–1.49)
Prothrombin Time: 13.3 seconds (ref 11.6–15.2)

## 2011-12-16 LAB — URINALYSIS, ROUTINE W REFLEX MICROSCOPIC
Bilirubin Urine: NEGATIVE
Leukocytes, UA: NEGATIVE
Nitrite: NEGATIVE
Specific Gravity, Urine: 1.02 (ref 1.005–1.030)
Urobilinogen, UA: 0.2 mg/dL (ref 0.0–1.0)
pH: 6 (ref 5.0–8.0)

## 2011-12-16 LAB — APTT: aPTT: 27 seconds (ref 24–37)

## 2011-12-16 NOTE — Patient Instructions (Addendum)
YOUR SURGERY IS SCHEDULED ON:  Monday  7/1  AT  11:15 AM  REPORT TO [facility] AT:  8:45 AM      PHONE # FOR SHORT STAY IS [phone]  DO NOT EAT OR DRINK ANYTHING AFTER MIDNIGHT THE NIGHT BEFORE YOUR SURGERY.  YOU MAY BRUSH YOUR TEETH, RINSE OUT YOUR MOUTH--BUT NO WATER, NO FOOD, NO CHEWING GUM, NO MINTS, NO CANDIES, NO CHEWING TOBACCO.  PLEASE TAKE THE FOLLOWING MEDICATIONS THE AM OF YOUR SURGERY WITH A FEW SIPS OF WATER:  PROTONIX, PREDNISONE, PEPCID, OXYBUTYNIN.     YOU MAY BRING ANY CREAMS OR LOTIONS YOU NEED FOR YOUR SKIN    IF YOU USE INHALERS--USE YOUR INHALERS THE AM OF YOUR SURGERY AND BRING INHALERS TO THE HOSPITAL -TAKE TO SURGERY.    IF YOU ARE DIABETIC:  DO NOT TAKE ANY DIABETIC MEDICATIONS THE AM OF YOUR SURGERY.  IF YOU TAKE INSULIN IN THE EVENINGS--PLEASE ONLY TAKE 1/2 NORMAL EVENING DOSE THE NIGHT BEFORE YOUR SURGERY.  NO INSULIN THE AM OF YOUR SURGERY.  IF YOU HAVE SLEEP APNEA AND USE CPAP OR BIPAP--PLEASE BRING THE MASK --NOT THE MACHINE-NOT THE TUBING   -JUST THE MASK. DO NOT BRING VALUABLES, MONEY, CREDIT CARDS.  CONTACT LENS, DENTURES / PARTIALS, GLASSES SHOULD NOT BE WORN TO SURGERY AND IN MOST CASES-HEARING AIDS WILL NEED TO BE REMOVED.  BRING YOUR GLASSES CASE, ANY EQUIPMENT NEEDED FOR YOUR CONTACT LENS. FOR PATIENTS ADMITTED TO THE HOSPITAL--CHECK OUT TIME THE DAY OF DISCHARGE IS 11:00 AM.  ALL INPATIENT ROOMS ARE PRIVATE - WITH BATHROOM, TELEPHONE, TELEVISION AND WIFI INTERNET. IF YOU ARE BEING DISCHARGED THE SAME DAY OF YOUR SURGERY--YOU CAN NOT DRIVE YOURSELF HOME--AND SHOULD NOT GO HOME ALONE BY TAXI OR BUS.  NO DRIVING OR OPERATING MACHINERY FOR 24 HOURS FOLLOWING ANESTHESIA / PAIN MEDICATIONS.                            SPECIAL INSTRUCTIONS:  CHLORHEXIDINE SOAP SHOWER (other brand names are Betasept and Hibiclens ) PLEASE SHOWER WITH CHLORHEXIDINE THE NIGHT BEFORE YOUR SURGERY AND THE AM OF YOUR SURGERY. DO NOT USE CHLORHEXIDINE ON YOUR  FACE OR PRIVATE AREAS--YOU MAY USE YOUR NORMAL SOAP THOSE AREAS AND YOUR NORMAL SHAMPOO.  WOMEN SHOULD AVOID SHAVING UNDER ARMS AND SHAVING LEGS 48 HOURS BEFORE USING CHLORHEXIDINE TO AVOID SKIN IRRITATION.  DO NOT USE IF ALLERGIC TO CHLORHEXIDINE.  PLEASE READ OVER ANY  FACT SHEETS THAT YOU WERE GIVEN: MRSA INFORMATION, BLOOD TRANSFUSION INFORMATION, INCENTIVE SPIROMETER INFORMATION.

## 2011-12-16 NOTE — Pre-Procedure Instructions (Signed)
PT HAS EKG REPORT ON CHART FROM DR. JENKINS' OFFICE -DONE 12/13/11 WITH OFFICE NOTE AND CLEARANCE FOR KNEE REPLACMENT  IF PREOP LABS W/IN NORMALS. PERIOPERATIVE PRESCRIPTION FOR IMPLANTED CARDIAC DEVICE ( PACEMAKER ) ON PT'S CHART FROM DR. G. TAYLOR ALONG WITH COPY OF PT'S PACEMAKER CARD, AND LAST CARDIOLOGY OFFICE NOTE 11/18/11. CBC, CMET, PT, PTT, UA AND CXR WERE DONE TODAY - PREOP - AT [facility] AS PER ORDERS DR. Lequita Halt AND ANESTHESIOLOGIST'S GUIDELINES. T/S WILL BE DONE DAY OF SURGERY. PREOP TEACHING WAS DONE USING TEACH BACK METHOD.

## 2011-12-17 NOTE — H&P (Signed)
Christopher Baker DOB: 04/27/1934  Chief Complaint: left knee pain  History of Present Illness The patient is a 78 year old male who comes in today for a preoperative History and Physical. The patient is scheduled for a left total knee arthroplasty to be performed by Dr. Gus Rankin. Aluisio, MD at [facility] . He has advanced end stage arthritis of the left knee with progressively worsening pain and dysfunction. He states the knee is bothering him at all times. It is limiting what he can and cannot do. He has had arthroscopy, cortisone and viscosupplements, none of which have helped. He has bone on bone changes in the medial and patellofemoral compartments both on x-ray and an arthroscopy.The most predictable means of getting him better at this point would be to replace the knee. Risks and benefits of the surgery discussed.    Problem List/Past MedicalHistory Osteoarthritis, Knee (715.96) S/P arthroscopy of knee (V45.89) Syndrome, rotator cuff NOS (726.10). 02/12/2004 Mech cmpl intrn orth dev/implant/graft (996.4). 06/23/2004 Lumbago (724.2). 03/21/2007 Synovitis NEC (727.09). 03/21/2007 Pain in joint, lower leg (719.46). 11/05/2010 Anemia Cardiac Arrhythmia Chronic Pain Gastroesophageal Reflux Disease Hypercholesterolemia Osteoarthritis Prostate Cancer. 2007 Tinnitus Cataract Hypertension Atrial Fibrillation Cellulitis Staphlococcus Infections Peripheral Edema Psoriasis   Allergies Formaldehyde    Family History Cerebrovascular Accident. mother Osteoarthritis. mother Father. deceased at young age due to an accident Mother. deceased age 37 due to stroke   Social History Alcohol use. Occasional alcohol use. current drinker; drinks beer and hard liquor; only occasionally per week Tobacco use. Current some day smoker. Children. 2 Current work status. working part time Drug/Alcohol Rehab (Currently). no Exercise. Exercises daily;  does gym / weights Illicit drug use. no Living situation. live with spouse Marital status. 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 4-5 Pain Contract. no Previously in rehab. no Tobacco / smoke exposure. yes outdoors only Post-Surgical Plans. going home with wife Advance Directives. living will, healthcare POA   Medication History PredniSONE (5MG  Tablet, Oral) Active. Zetia (10MG  Tablet, Oral) Active. Lipitor (20MG  Tablet, Oral) Active. Nabumetone (750MG  Tablet, Oral two times daily) Active. Pantoprazole Sodium (40MG  Tablet DR, Oral) Active. ROPINIRole HCl (2MG  Tablet, Oral) Active. Vitamin D (Ergocalciferol) (50000UNIT Capsule, Oral) Active. Norvasc (5MG  Tablet, Oral daily) Active. Ferrex 150 (150MG  Capsule, Oral two times daily) Active. Oxybutynin Chloride (5MG  Tablet, Oral two times daily) Active. Voltaren (1% Gel, Transdermal four times daily) Active. Famotidine (20MG  Tablet, Oral) Active. Vitamin C ER (500MG  Tablet ER, Oral) Active. Methotrexate (2.5MG  Tablet, Oral) Active. (four tablets once per week)   Past Surgical History Total Knee Replacement - Right. 1997; revision 1997; I&D 1997 Cardiac Ablation. 2008 Cholecystectomy. 2009 Spinal Fusion - Lower Back. L4-L5 2010 Arthroscopic Knee Surgery - Left. 2012    Review of Systems General:Present- Fatigue and Weight Gain. Not Present- Chills, Fever, Night Sweats, Appetite Loss, Feeling sick, Weight Loss and Memory Loss. Skin:Not Present- Hives, Itching, Rash, Eczema and Lesions. HEENT:Not Present- Sensitivity to light, Tinnitus, Nose Bleed, Headache, Double Vision, Visual Loss, Hearing Loss, Decreased Hearing, Ringing in the Ears and Dentures. Respiratory:Present- Allergies. Not Present- Shortness of breath, Shortness of breath with exertion, Shortness of breath at rest, Coughing up blood, Snoring, Chronic Cough and  Bloody sputum. Cardiovascular:Not Present- Shortness of Breath, Chest Pain, Swelling of Extremities, Racing/skipping heartbeats, Leg Cramps, Difficulty Breathing Lying Down, Murmur, Swelling and Palpitations. Gastrointestinal:Present- Heartburn. Not Present- Bloody Stool, Abdominal Pain, Vomiting, Nausea, Constipation, Diarrhea, Difficulty Swallowing, Incontinence of  Stool, Jaundice and Loss of appetitie. Male Genitourinary:Not Present- Urinary frequency, Blood in Urine, Weak urinary stream, Discharge, Flank Pain, Frequency, Incontinence, Nocturia, Painful Urination, Urgency, Urinary Retention and Urinating at Night. Musculoskeletal:Present- Joint Stiffness, Joint Pain and Morning Stiffness. Not Present- Muscle Weakness, Muscle Pain, Joint Swelling, Back Pain and Spasms. Neurological:Not Present- Tingling, Numbness, Burning, Tremor, Headaches, Dizziness, Blackout spells, Paralysis, Difficulty with balance and Weakness. Psychiatric:Present- Memory Loss and Insomnia. Not Present- Anxiety and Depression.   Vitals Weight: 236 lb Height: 75 in Body Surface Area: 2.38 m Body Mass Index: 29.5 kg/m Pulse: 64 (Regular) Resp.: 18 (Unlabored) BP: 138/68 (Sitting, Left Arm, Standard)    Physical Exam General Mental Status - Alert, cooperative and good historian. General Appearance- pleasant. Not in acute distress. Orientation- Oriented X3. Build & Nutrition- Well nourished and Well developed. Head and Neck Head- normocephalic, atraumatic . Neck Global Assessment- supple. no bruit auscultated on the right and no bruit auscultated on the left. Eye Pupil- Bilateral- Regular and Round. Motion- Bilateral- EOMI. Chest and Lung Exam Auscultation: Breath sounds:- clear at anterior chest wall and - clear at posterior chest wall. Adventitious sounds:- No Adventitious sounds. Cardiovascular Auscultation:Rhythm- Irregularly irregular. Heart Sounds- S1 WNL and S2  WNL. Murmurs & Other Heart Sounds:Auscultation of the heart reveals - No Murmurs. Abdomen Palpation/Percussion:Tenderness- Abdomen is non-tender to palpation. Rigidity (guarding)- Abdomen is soft. Auscultation:Auscultation of the abdomen reveals - Bowel sounds normal. Male Genitourinary Not done, not pertinent to present illness Peripheral Vascular Upper Extremity: Palpation:- Pulses bilaterally normal. Lower Extremity: Palpation:- Pulses bilaterally normal. Neurologic Examination of related systems reveals - normal muscle strength and tone in all extremities. Neurologic evaluation reveals - normal sensation and upper and lower extremity deep tendon reflexes intact bilaterally . Musculoskeletal Left knee shows minimal effusion. Range is about 5-125. There is moderate crepitus on range of motion. He is tender medial greater than lateral. There is no instability noted. Right knee nontender. No effusion. 0 to 125 degrees. Incision from right TKA healed. No signs of infection. Normal painless motion of hips. Edema present in the ankles and feet bilaterally.     Assessment & Plan Osteoarthritis, Knee (715.96) Left total knee arthroplasty       Dimitri Ped, PA-C

## 2011-12-20 ENCOUNTER — Encounter (HOSPITAL_COMMUNITY): Payer: Self-pay | Admitting: *Deleted

## 2011-12-20 ENCOUNTER — Encounter (HOSPITAL_COMMUNITY)
Admission: RE | Disposition: A | Discharge status: Home Health | Payer: Self-pay | Source: Ambulatory Visit | Attending: Orthopedic Surgery

## 2011-12-20 ENCOUNTER — Encounter (HOSPITAL_COMMUNITY): Payer: Self-pay | Admitting: Anesthesiology

## 2011-12-20 ENCOUNTER — Ambulatory Visit (HOSPITAL_COMMUNITY): Payer: Medicare Other | Admitting: Anesthesiology

## 2011-12-20 ENCOUNTER — Inpatient Hospital Stay (HOSPITAL_COMMUNITY)
Admission: RE | Admit: 2011-12-20 | Discharge: 2011-12-23 | DRG: 470 | Disposition: A | Discharge status: Home Health | Payer: Medicare Other | Source: Ambulatory Visit | Attending: Orthopedic Surgery | Admitting: Orthopedic Surgery

## 2011-12-20 DIAGNOSIS — Z96659 Presence of unspecified artificial knee joint: Secondary | ICD-10-CM

## 2011-12-20 DIAGNOSIS — M1710 Unilateral primary osteoarthritis, unspecified knee: Principal | ICD-10-CM | POA: Diagnosis present

## 2011-12-20 DIAGNOSIS — Z950 Presence of cardiac pacemaker: Secondary | ICD-10-CM

## 2011-12-20 DIAGNOSIS — I10 Essential (primary) hypertension: Secondary | ICD-10-CM | POA: Diagnosis present

## 2011-12-20 DIAGNOSIS — I739 Peripheral vascular disease, unspecified: Secondary | ICD-10-CM | POA: Diagnosis present

## 2011-12-20 DIAGNOSIS — F17200 Nicotine dependence, unspecified, uncomplicated: Secondary | ICD-10-CM | POA: Diagnosis present

## 2011-12-20 DIAGNOSIS — IMO0001 Reserved for inherently not codable concepts without codable children: Secondary | ICD-10-CM

## 2011-12-20 HISTORY — PX: TOTAL KNEE ARTHROPLASTY: SHX125

## 2011-12-20 SURGERY — ARTHROPLASTY, KNEE, TOTAL
Anesthesia: Spinal | Site: Knee | Laterality: Left | Wound class: Clean

## 2011-12-20 MED ORDER — MORPHINE SULFATE (PF) 1 MG/ML IV SOLN
INTRAVENOUS | Status: DC
  Administered 2011-12-20: 1 mg via INTRAVENOUS
  Administered 2011-12-20: 12 mg via INTRAVENOUS
  Administered 2011-12-20: 22:00:00 via INTRAVENOUS
  Administered 2011-12-21: 7 mg via INTRAVENOUS
  Filled 2011-12-20: qty 25

## 2011-12-20 MED ORDER — ACETAMINOPHEN 10 MG/ML IV SOLN
1000.0000 mg | Freq: Four times a day (QID) | INTRAVENOUS | Status: AC
  Administered 2011-12-20 – 2011-12-21 (×4): 1000 mg via INTRAVENOUS
  Filled 2011-12-20 (×5): qty 100

## 2011-12-20 MED ORDER — LACTATED RINGERS IV SOLN
INTRAVENOUS | Status: DC | PRN
  Administered 2011-12-20 (×3): via INTRAVENOUS

## 2011-12-20 MED ORDER — RIVAROXABAN 10 MG PO TABS
10.0000 mg | ORAL_TABLET | Freq: Every day | ORAL | Status: DC
  Administered 2011-12-21 – 2011-12-23 (×3): 10 mg via ORAL
  Filled 2011-12-20 (×4): qty 1

## 2011-12-20 MED ORDER — DIPHENHYDRAMINE HCL 12.5 MG/5ML PO ELIX
12.5000 mg | ORAL_SOLUTION | Freq: Four times a day (QID) | ORAL | Status: DC | PRN
  Filled 2011-12-20: qty 5

## 2011-12-20 MED ORDER — FLEET ENEMA 7-19 GM/118ML RE ENEM: 1.0000 | ENEMA | Freq: Once | RECTAL | Status: AC | PRN

## 2011-12-20 MED ORDER — TRAMADOL HCL 50 MG PO TABS
50.0000 mg | ORAL_TABLET | Freq: Four times a day (QID) | ORAL | Status: DC | PRN
  Administered 2011-12-22: 100 mg via ORAL
  Administered 2011-12-22 (×2): 50 mg via ORAL
  Administered 2011-12-22: 100 mg via ORAL
  Administered 2011-12-23: 50 mg via ORAL
  Filled 2011-12-20: qty 2
  Filled 2011-12-20 (×2): qty 1
  Filled 2011-12-20: qty 2
  Filled 2011-12-20: qty 1

## 2011-12-20 MED ORDER — METHOCARBAMOL 500 MG PO TABS
500.0000 mg | ORAL_TABLET | Freq: Four times a day (QID) | ORAL | Status: DC | PRN
  Administered 2011-12-21 – 2011-12-23 (×5): 500 mg via ORAL
  Filled 2011-12-20 (×6): qty 1

## 2011-12-20 MED ORDER — ZOLPIDEM TARTRATE 5 MG PO TABS: 5.0000 mg | ORAL_TABLET | Freq: Every evening | ORAL | Status: DC | PRN

## 2011-12-20 MED ORDER — PREDNISONE 20 MG PO TABS
20.0000 mg | ORAL_TABLET | Freq: Every day | ORAL | Status: AC
  Administered 2011-12-20: 20 mg via ORAL
  Filled 2011-12-20: qty 1

## 2011-12-20 MED ORDER — HYDROMORPHONE HCL PF 1 MG/ML IJ SOLN
INTRAMUSCULAR | Status: AC
  Filled 2011-12-20: qty 1

## 2011-12-20 MED ORDER — SODIUM CHLORIDE 0.9 % IR SOLN
Status: DC | PRN
  Administered 2011-12-20: 1000 mL

## 2011-12-20 MED ORDER — MIDAZOLAM HCL 5 MG/5ML IJ SOLN
INTRAMUSCULAR | Status: DC | PRN
  Administered 2011-12-20 (×2): 2 mg via INTRAVENOUS

## 2011-12-20 MED ORDER — BISACODYL 10 MG RE SUPP: 10.0000 mg | Freq: Every day | RECTAL | Status: DC | PRN

## 2011-12-20 MED ORDER — ROPINIROLE HCL 1 MG PO TABS
2.0000 mg | ORAL_TABLET | Freq: Every day | ORAL | Status: DC
  Administered 2011-12-20 – 2011-12-22 (×3): 2 mg via ORAL
  Filled 2011-12-20 (×4): qty 2

## 2011-12-20 MED ORDER — ACETAMINOPHEN 10 MG/ML IV SOLN
INTRAVENOUS | Status: AC
  Filled 2011-12-20: qty 100

## 2011-12-20 MED ORDER — PROPOFOL 10 MG/ML IV EMUL
INTRAVENOUS | Status: DC | PRN
  Administered 2011-12-20: 75 ug/kg/min via INTRAVENOUS

## 2011-12-20 MED ORDER — PHENOL 1.4 % MT LIQD
1.0000 | OROMUCOSAL | Status: DC | PRN
  Filled 2011-12-20: qty 177

## 2011-12-20 MED ORDER — SODIUM CHLORIDE 0.9 % IV SOLN: INTRAVENOUS | Status: DC

## 2011-12-20 MED ORDER — PREDNISONE 5 MG PO TABS
5.0000 mg | ORAL_TABLET | Freq: Every day | ORAL | Status: DC
  Filled 2011-12-20: qty 1

## 2011-12-20 MED ORDER — ONDANSETRON HCL 4 MG/2ML IJ SOLN: 4.0000 mg | Freq: Four times a day (QID) | INTRAMUSCULAR | Status: DC | PRN

## 2011-12-20 MED ORDER — PROMETHAZINE HCL 25 MG/ML IJ SOLN: 6.2500 mg | INTRAMUSCULAR | Status: DC | PRN

## 2011-12-20 MED ORDER — ACETAMINOPHEN 10 MG/ML IV SOLN
1000.0000 mg | Freq: Once | INTRAVENOUS | Status: DC
  Filled 2011-12-20: qty 100

## 2011-12-20 MED ORDER — CHLORHEXIDINE GLUCONATE 4 % EX LIQD
60.0000 mL | Freq: Once | CUTANEOUS | Status: DC
  Filled 2011-12-20: qty 60

## 2011-12-20 MED ORDER — BUPIVACAINE HCL (PF) 0.25 % IJ SOLN
INTRAMUSCULAR | Status: DC | PRN
  Administered 2011-12-20: 13:00:00

## 2011-12-20 MED ORDER — NALOXONE HCL 0.4 MG/ML IJ SOLN: 0.4000 mg | INTRAMUSCULAR | Status: DC | PRN

## 2011-12-20 MED ORDER — ONDANSETRON HCL 4 MG PO TABS: 4.0000 mg | ORAL_TABLET | Freq: Four times a day (QID) | ORAL | Status: DC | PRN

## 2011-12-20 MED ORDER — MORPHINE SULFATE (PF) 1 MG/ML IV SOLN
INTRAVENOUS | Status: AC
  Administered 2011-12-20: 6 mg via INTRAVENOUS
  Filled 2011-12-20: qty 25

## 2011-12-20 MED ORDER — FAMOTIDINE 20 MG PO TABS
20.0000 mg | ORAL_TABLET | Freq: Every day | ORAL | Status: DC | PRN
  Filled 2011-12-20: qty 1

## 2011-12-20 MED ORDER — PREDNISONE 10 MG PO TABS
10.0000 mg | ORAL_TABLET | Freq: Two times a day (BID) | ORAL | Status: AC
  Administered 2011-12-22 (×2): 10 mg via ORAL
  Filled 2011-12-20 (×2): qty 1

## 2011-12-20 MED ORDER — BUPIVACAINE 0.25 % ON-Q PUMP SINGLE CATH 300ML
INJECTION | Status: AC
  Filled 2011-12-20: qty 300

## 2011-12-20 MED ORDER — ACETAMINOPHEN 325 MG PO TABS
650.0000 mg | ORAL_TABLET | Freq: Four times a day (QID) | ORAL | Status: DC | PRN
  Administered 2011-12-22 – 2011-12-23 (×3): 650 mg via ORAL
  Filled 2011-12-20 (×3): qty 2

## 2011-12-20 MED ORDER — OXYBUTYNIN CHLORIDE 5 MG PO TABS
5.0000 mg | ORAL_TABLET | Freq: Two times a day (BID) | ORAL | Status: DC
  Administered 2011-12-20 – 2011-12-23 (×6): 5 mg via ORAL
  Filled 2011-12-20 (×7): qty 1

## 2011-12-20 MED ORDER — PREDNISONE 5 MG PO TABS
5.0000 mg | ORAL_TABLET | Freq: Two times a day (BID) | ORAL | Status: DC
  Administered 2011-12-23: 5 mg via ORAL
  Filled 2011-12-20 (×2): qty 1

## 2011-12-20 MED ORDER — FENTANYL CITRATE 0.05 MG/ML IJ SOLN
INTRAMUSCULAR | Status: DC | PRN
  Administered 2011-12-20: 100 ug via INTRAVENOUS

## 2011-12-20 MED ORDER — CEFAZOLIN SODIUM-DEXTROSE 2-3 GM-% IV SOLR
2.0000 g | INTRAVENOUS | Status: AC
  Administered 2011-12-20: 2 g via INTRAVENOUS

## 2011-12-20 MED ORDER — BUPIVACAINE ON-Q PAIN PUMP (FOR ORDER SET NO CHG)
INJECTION | Status: DC
  Filled 2011-12-20: qty 1

## 2011-12-20 MED ORDER — CEFAZOLIN SODIUM 1-5 GM-% IV SOLN
1.0000 g | Freq: Four times a day (QID) | INTRAVENOUS | Status: AC
  Administered 2011-12-20 (×2): 1 g via INTRAVENOUS
  Filled 2011-12-20 (×2): qty 50

## 2011-12-20 MED ORDER — MENTHOL 3 MG MT LOZG
1.0000 | LOZENGE | OROMUCOSAL | Status: DC | PRN
  Filled 2011-12-20: qty 9

## 2011-12-20 MED ORDER — DIPHENHYDRAMINE HCL 50 MG/ML IJ SOLN: 12.5000 mg | Freq: Four times a day (QID) | INTRAMUSCULAR | Status: DC | PRN

## 2011-12-20 MED ORDER — DOCUSATE SODIUM 100 MG PO CAPS
100.0000 mg | ORAL_CAPSULE | Freq: Two times a day (BID) | ORAL | Status: DC
Start: 2011-12-20 — End: 2011-12-23
  Administered 2011-12-20 – 2011-12-23 (×6): 100 mg via ORAL

## 2011-12-20 MED ORDER — POLYETHYLENE GLYCOL 3350 17 G PO PACK: 17.0000 g | PACK | Freq: Every day | ORAL | Status: DC | PRN

## 2011-12-20 MED ORDER — METHOCARBAMOL 100 MG/ML IJ SOLN
500.0000 mg | Freq: Four times a day (QID) | INTRAVENOUS | Status: DC | PRN
  Administered 2011-12-20: 500 mg via INTRAVENOUS
  Filled 2011-12-20: qty 5

## 2011-12-20 MED ORDER — BUPIVACAINE IN DEXTROSE 0.75-8.25 % IT SOLN
INTRATHECAL | Status: DC | PRN
  Administered 2011-12-20: 1.5 mg via INTRATHECAL

## 2011-12-20 MED ORDER — METOCLOPRAMIDE HCL 5 MG/ML IJ SOLN: 5.0000 mg | Freq: Three times a day (TID) | INTRAMUSCULAR | Status: DC | PRN

## 2011-12-20 MED ORDER — EZETIMIBE 10 MG PO TABS
10.0000 mg | ORAL_TABLET | Freq: Every day | ORAL | Status: DC
  Administered 2011-12-20 – 2011-12-22 (×3): 10 mg via ORAL
  Filled 2011-12-20 (×4): qty 1

## 2011-12-20 MED ORDER — METOCLOPRAMIDE HCL 10 MG PO TABS: 5.0000 mg | ORAL_TABLET | Freq: Three times a day (TID) | ORAL | Status: DC | PRN

## 2011-12-20 MED ORDER — ONDANSETRON HCL 4 MG/2ML IJ SOLN
INTRAMUSCULAR | Status: DC | PRN
  Administered 2011-12-20: 4 mg via INTRAVENOUS

## 2011-12-20 MED ORDER — ATORVASTATIN CALCIUM 20 MG PO TABS
20.0000 mg | ORAL_TABLET | Freq: Every day | ORAL | Status: DC
  Administered 2011-12-20 – 2011-12-22 (×3): 20 mg via ORAL
  Filled 2011-12-20 (×4): qty 1

## 2011-12-20 MED ORDER — DIPHENHYDRAMINE HCL 12.5 MG/5ML PO ELIX
12.5000 mg | ORAL_SOLUTION | ORAL | Status: DC | PRN
  Administered 2011-12-21: 25 mg via ORAL
  Filled 2011-12-20: qty 10

## 2011-12-20 MED ORDER — ONDANSETRON HCL 4 MG/2ML IJ SOLN
4.0000 mg | Freq: Four times a day (QID) | INTRAMUSCULAR | Status: DC | PRN
  Filled 2011-12-20: qty 2

## 2011-12-20 MED ORDER — 0.9 % SODIUM CHLORIDE (POUR BTL) OPTIME
TOPICAL | Status: DC | PRN
  Administered 2011-12-20: 1000 mL

## 2011-12-20 MED ORDER — ACETAMINOPHEN 650 MG RE SUPP: 650.0000 mg | Freq: Four times a day (QID) | RECTAL | Status: DC | PRN

## 2011-12-20 MED ORDER — AMLODIPINE BESYLATE 5 MG PO TABS
5.0000 mg | ORAL_TABLET | Freq: Every day | ORAL | Status: DC
  Administered 2011-12-20 – 2011-12-22 (×3): 5 mg via ORAL
  Filled 2011-12-20 (×4): qty 1

## 2011-12-20 MED ORDER — OXYCODONE HCL 5 MG PO TABS
5.0000 mg | ORAL_TABLET | ORAL | Status: DC | PRN
  Administered 2011-12-21 (×4): 10 mg via ORAL
  Filled 2011-12-20 (×4): qty 2

## 2011-12-20 MED ORDER — SODIUM CHLORIDE 0.9 % IJ SOLN: 9.0000 mL | INTRAMUSCULAR | Status: DC | PRN

## 2011-12-20 MED ORDER — PREDNISONE 20 MG PO TABS
20.0000 mg | ORAL_TABLET | Freq: Every day | ORAL | Status: AC
  Administered 2011-12-21: 20 mg via ORAL
  Filled 2011-12-20: qty 1

## 2011-12-20 MED ORDER — PANTOPRAZOLE SODIUM 40 MG PO TBEC
40.0000 mg | DELAYED_RELEASE_TABLET | Freq: Two times a day (BID) | ORAL | Status: DC
  Administered 2011-12-20 – 2011-12-23 (×6): 40 mg via ORAL
  Filled 2011-12-20 (×8): qty 1

## 2011-12-20 MED ORDER — CLOTRIMAZOLE 1 % EX CREA
TOPICAL_CREAM | Freq: Two times a day (BID) | CUTANEOUS | Status: DC
  Administered 2011-12-21: 1 via TOPICAL
  Administered 2011-12-22: 10:00:00 via TOPICAL
  Filled 2011-12-20: qty 15

## 2011-12-20 MED ORDER — CEFAZOLIN SODIUM-DEXTROSE 2-3 GM-% IV SOLR
INTRAVENOUS | Status: AC
  Filled 2011-12-20: qty 50

## 2011-12-20 MED ORDER — HYDROMORPHONE HCL PF 1 MG/ML IJ SOLN
0.2500 mg | INTRAMUSCULAR | Status: DC | PRN
  Administered 2011-12-20 (×3): 0.5 mg via INTRAVENOUS

## 2011-12-20 MED ORDER — DEXTROSE-NACL 5-0.45 % IV SOLN
INTRAVENOUS | Status: DC
  Administered 2011-12-20 – 2011-12-21 (×2): via INTRAVENOUS

## 2011-12-20 MED ORDER — ACETAMINOPHEN 10 MG/ML IV SOLN
INTRAVENOUS | Status: DC | PRN
  Administered 2011-12-20: 1000 mg via INTRAVENOUS

## 2011-12-20 SURGICAL SUPPLY — 57 items
BAG SPEC THK2 15X12 ZIP CLS (MISCELLANEOUS) ×1
BAG ZIPLOCK 12X15 (MISCELLANEOUS) ×2 IMPLANT
BANDAGE ELASTIC 6 VELCRO ST LF (GAUZE/BANDAGES/DRESSINGS) ×2 IMPLANT
BANDAGE ESMARK 6X9 LF (GAUZE/BANDAGES/DRESSINGS) ×1 IMPLANT
BLADE SAG 18X100X1.27 (BLADE) ×2 IMPLANT
BLADE SAW SGTL 11.0X1.19X90.0M (BLADE) ×2 IMPLANT
BNDG CMPR 9X6 STRL LF SNTH (GAUZE/BANDAGES/DRESSINGS) ×1
BNDG ESMARK 6X9 LF (GAUZE/BANDAGES/DRESSINGS) ×2
BOWL SMART MIX CTS (DISPOSABLE) ×2 IMPLANT
CATH KIT ON-Q SILVERSOAK 5 (CATHETERS) ×1 IMPLANT
CATH KIT ON-Q SILVERSOAK 5IN (CATHETERS) ×2 IMPLANT
CEMENT HV SMART SET (Cement) ×3 IMPLANT
CLOTH BEACON ORANGE TIMEOUT ST (SAFETY) ×2 IMPLANT
CLSR STERI-STRIP ANTIMIC 1/2X4 (GAUZE/BANDAGES/DRESSINGS) ×1 IMPLANT
CUFF TOURN SGL QUICK 34 (TOURNIQUET CUFF) ×2
CUFF TRNQT CYL 34X4X40X1 (TOURNIQUET CUFF) ×1 IMPLANT
DRAPE EXTREMITY T 121X128X90 (DRAPE) ×2 IMPLANT
DRAPE POUCH INSTRU U-SHP 10X18 (DRAPES) ×2 IMPLANT
DRAPE U-SHAPE 47X51 STRL (DRAPES) ×2 IMPLANT
DRSG ADAPTIC 3X8 NADH LF (GAUZE/BANDAGES/DRESSINGS) ×2 IMPLANT
DRSG PAD ABDOMINAL 8X10 ST (GAUZE/BANDAGES/DRESSINGS) ×1 IMPLANT
DURAPREP 26ML APPLICATOR (WOUND CARE) ×2 IMPLANT
ELECT REM PT RETURN 9FT ADLT (ELECTROSURGICAL) ×2
ELECTRODE REM PT RTRN 9FT ADLT (ELECTROSURGICAL) ×1 IMPLANT
EVACUATOR 1/8 PVC DRAIN (DRAIN) ×2 IMPLANT
FACESHIELD LNG OPTICON STERILE (SAFETY) ×11 IMPLANT
GLOVE BIO SURGEON STRL SZ7.5 (GLOVE) ×2 IMPLANT
GLOVE BIO SURGEON STRL SZ8 (GLOVE) ×2 IMPLANT
GLOVE BIOGEL PI IND STRL 8 (GLOVE) ×2 IMPLANT
GLOVE BIOGEL PI INDICATOR 8 (GLOVE) ×2
GOWN STRL NON-REIN LRG LVL3 (GOWN DISPOSABLE) ×2 IMPLANT
GOWN STRL REIN XL XLG (GOWN DISPOSABLE) ×2 IMPLANT
HANDPIECE INTERPULSE COAX TIP (DISPOSABLE) ×2
IMMOBILIZER KNEE 20 (SOFTGOODS) ×2
IMMOBILIZER KNEE 20 THIGH 36 (SOFTGOODS) ×1 IMPLANT
KIT BASIN OR (CUSTOM PROCEDURE TRAY) ×2 IMPLANT
MANIFOLD NEPTUNE II (INSTRUMENTS) ×2 IMPLANT
NS IRRIG 1000ML POUR BTL (IV SOLUTION) ×2 IMPLANT
PACK TOTAL JOINT (CUSTOM PROCEDURE TRAY) ×2 IMPLANT
PAD ABD 7.5X8 STRL (GAUZE/BANDAGES/DRESSINGS) ×2 IMPLANT
PADDING CAST COTTON 6X4 STRL (CAST SUPPLIES) ×6 IMPLANT
PADDING CAST SYN 6 (CAST SUPPLIES) ×1
PADDING CAST SYNTHETIC 6X4 NS (CAST SUPPLIES) IMPLANT
POSITIONER SURGICAL ARM (MISCELLANEOUS) ×2 IMPLANT
SET HNDPC FAN SPRY TIP SCT (DISPOSABLE) ×1 IMPLANT
SPONGE GAUZE 4X4 12PLY (GAUZE/BANDAGES/DRESSINGS) ×2 IMPLANT
STRIP CLOSURE SKIN 1/2X4 (GAUZE/BANDAGES/DRESSINGS) ×4 IMPLANT
SUCTION FRAZIER 12FR DISP (SUCTIONS) ×2 IMPLANT
SUT MNCRL AB 4-0 PS2 18 (SUTURE) ×2 IMPLANT
SUT PDS AB 1 CT1 27 (SUTURE) ×6 IMPLANT
SUT VIC AB 2-0 CT1 27 (SUTURE) ×6
SUT VIC AB 2-0 CT1 TAPERPNT 27 (SUTURE) ×3 IMPLANT
SUT VLOC 180 0 24IN GS25 (SUTURE) ×2 IMPLANT
TOWEL OR 17X26 10 PK STRL BLUE (TOWEL DISPOSABLE) ×4 IMPLANT
TRAY FOLEY CATH 14FRSI W/METER (CATHETERS) ×2 IMPLANT
WATER STERILE IRR 1500ML POUR (IV SOLUTION) ×2 IMPLANT
WRAP KNEE MAXI GEL POST OP (GAUZE/BANDAGES/DRESSINGS) ×4 IMPLANT

## 2011-12-20 NOTE — Anesthesia Postprocedure Evaluation (Signed)
  Anesthesia Post-op Note  Patient: Christopher Baker  Procedure(s) Performed: Procedure(s) (LRB): TOTAL KNEE ARTHROPLASTY (Left)  Patient Location: PACU  Anesthesia Type: Spinal  Level of Consciousness: awake and alert   Airway and Oxygen Therapy: Patient Spontanous Breathing  Post-op Pain: mild  Post-op Assessment: Post-op Vital signs reviewed, Patient's Cardiovascular Status Stable, Respiratory Function Stable, Patent Airway and No signs of Nausea or vomiting  Post-op Vital Signs: stable  Complications: No apparent anesthesia complications

## 2011-12-20 NOTE — Interval H&P Note (Signed)
History and Physical Interval Note:  12/20/2011 11:14 AM  Christopher Baker  has presented today for surgery, with the diagnosis of Osteoarthritis of the Left Knee  The various methods of treatment have been discussed with the patient and family. After consideration of risks, benefits and other options for treatment, the patient has consented to  Procedure(s) (LRB): TOTAL KNEE ARTHROPLASTY (Left) as a surgical intervention .  The patient's history has been reviewed, patient examined, no change in status, stable for surgery.  I have reviewed the patients' chart and labs.  Questions were answered to the patient's satisfaction.     Loanne Drilling

## 2011-12-20 NOTE — Transfer of Care (Signed)
Immediate Anesthesia Transfer of Care Note  Patient: Christopher Baker  Procedure(s) Performed: Procedure(s) (LRB): TOTAL KNEE ARTHROPLASTY (Left)  Patient Location: PACU  Anesthesia Type: Regional  Level of Consciousness: awake, alert  and oriented  Airway & Oxygen Therapy: Patient Spontanous Breathing and Patient connected to face mask oxygen  Post-op Assessment: Report given to PACU RN and Post -op Vital signs reviewed and stable  Post vital signs: Reviewed and stable  Complications: No apparent anesthesia complications SAB L1

## 2011-12-20 NOTE — Op Note (Signed)
Pre-operative diagnosis- Osteoarthritis  Left knee(s)  Post-operative diagnosis- Osteoarthritis Left knee(s)  Procedure-  Left  Total Knee Arthroplasty  Surgeon- Gus Rankin. Alera Quevedo, MD  Assistant- Avel Peace, PA-C   Anesthesia-  Spinal EBL-* No blood loss amount entered *  Drains Hemovac  Tourniquet time-  Total Tourniquet Time Documented: Thigh (Left) - 35 minutes   Complications- None  Condition-PACU - hemodynamically stable.   Brief Clinical Note   Christopher Baker is a 78 y.o. year old male with end stage OA of his left knee with progressively worsening pain and dysfunction. He has constant pain, with activity and at rest and significant functional deficits with difficulties even with ADLs. He has had extensive non-op management including analgesics, injections of cortisone and viscosupplements, and home exercise program, but remains in significant pain with significant dysfunction. Radiographs show bone on bone arthritis medial and patellofemoral with tibial subchondral sclerosis. He presents now for left Total Knee Arthroplasty.    Procedure in detail---   The patient is brought into the operating room and positioned supine on the operating table. After successful administration of  Spinal,   a tourniquet is placed high on the  Left thigh(s) and the lower extremity is prepped and draped in the usual sterile fashion. Time out is performed by the operating team and then the  Left lower extremity is wrapped in Esmarch, knee flexed and the tourniquet inflated to 300 mmHg.       A midline incision is made with a ten blade through the subcutaneous tissue to the level of the extensor mechanism. A fresh blade is used to make a medial parapatellar arthrotomy. Soft tissue over the proximal medial tibia is subperiosteally elevated to the joint line with a knife and into the semimembranosus bursa with a Cobb elevator. Soft tissue over the proximal lateral tibia is elevated with attention being  paid to avoiding the patellar tendon on the tibial tubercle. The patella is everted, knee flexed 90 degrees and the ACL and PCL are removed. Findings are bone on bone medial and patellofemoral with large medial osteophytes.        The drill is used to create a starting hole in the distal femur and the canal is thoroughly irrigated with sterile saline to remove the fatty contents. The 5 degree Left  valgus alignment guide is placed into the femoral canal and the distal femoral cutting block is pinned to remove 11 mm off the distal femur. Resection is made with an oscillating saw.      The tibia is subluxed forward and the menisci are removed. The extramedullary alignment guide is placed referencing proximally at the medial aspect of the tibial tubercle and distally along the second metatarsal axis and tibial crest. The block is pinned to remove 2mm off the more deficient medial  side. Resection is made with an oscillating saw. Size 5is the most appropriate size for the tibia and the proximal tibia is prepared with the modular drill and keel punch for that size.      The femoral sizing guide is placed and size 5 is most appropriate. Rotation is marked off the epicondylar axis and confirmed by creating a rectangular flexion gap at 90 degrees. The size 5 cutting block is pinned in this rotation and the anterior, posterior and chamfer cuts are made with the oscillating saw. The intercondylar block is then placed and that cut is made.      Trial size 5 tibial component, trial size 5 posterior stabilized  femur and a 12.5  mm posterior stabilized rotating platform insert trial is placed. Full extension is achieved with excellent varus/valgus and anterior/posterior balance throughout full range of motion. The patella is everted and thickness measured to be 25  mm. Free hand resection is taken to 14 mm, a 41 template is placed, lug holes are drilled, trial patella is placed, and it tracks normally. Osteophytes are removed  off the posterior femur with the trial in place. All trials are removed and the cut bone surfaces prepared with pulsatile lavage. Cement is mixed and once ready for implantation, the size 5 tibial implant, size  5 posterior stabilized femoral component, and the size 41 patella are cemented in place and the patella is held with the clamp. The trial insert is placed and the knee held in full extension. All extruded cement is removed and once the cement is hard the permanent 12.5 mm posterior stabilized rotating platform insert is placed into the tibial tray.      The wound is copiously irrigated with saline solution and the extensor mechanism closed over a hemovac drain with #1 PDS suture. The tourniquet is released for a total tourniquet time of 35  minutes. Flexion against gravity is 140 degrees and the patella tracks normally. Subcutaneous tissue is closed with 2.0 vicryl and subcuticular with running 4.0 Monocryl. The catheter for the Marcaine pain pump is placed and the pump is initiated. The incision is cleaned and dried and steri-strips and a bulky sterile dressing are applied. The limb is placed into a knee immobilizer and the patient is awakened and transported to recovery in stable condition.      Please note that a surgical assistant was a medical necessity for this procedure in order to perform it in a safe and expeditious manner. Surgical assistant was necessary to retract the ligaments and vital neurovascular structures to prevent injury to them and also necessary for proper positioning of the limb to allow for anatomic placement of the prosthesis.   Gus Rankin Takela Varden, MD    12/20/2011, 12:36 PM

## 2011-12-20 NOTE — Progress Notes (Signed)
Utilization review completed.  

## 2011-12-20 NOTE — Anesthesia Procedure Notes (Signed)
Spinal  Patient location during procedure: OR End time: 12/20/2011 11:36 AM Staffing CRNA/Resident: Enriqueta Shutter Performed by: resident/CRNA  Preanesthetic Checklist Completed: patient identified, site marked, surgical consent, pre-op evaluation, timeout performed, IV checked, risks and benefits discussed and monitors and equipment checked Spinal Block Patient position: sitting Prep: Betadine Patient monitoring: heart rate, continuous pulse ox and blood pressure Approach: midline Location: L2-3 Injection technique: single-shot Needle Needle type: Pencil-Tip and Sprotte  Needle gauge: 25 G Needle length: 5 cm Assessment Sensory level: T6 Additional Notes Expiration date of kit checked and confirmed. Patient tolerated procedure well, without complications.

## 2011-12-20 NOTE — Anesthesia Preprocedure Evaluation (Addendum)
Anesthesia Evaluation  Patient identified by MRN, date of birth, ID band Patient awake    Reviewed: Allergy & Precautions, H&P , NPO status , Patient's Chart, lab work & pertinent test results  Airway Mallampati: II TM Distance: <3 FB Neck ROM: Full    Dental No notable dental hx.    Pulmonary Current Smoker,  breath sounds clear to auscultation  Pulmonary exam normal       Cardiovascular hypertension, + Peripheral Vascular Disease + pacemaker Rhythm:Regular Rate:Normal     Neuro/Psych negative neurological ROS  negative psych ROS   GI/Hepatic negative GI ROS, Neg liver ROS,   Endo/Other  negative endocrine ROS  Renal/GU negative Renal ROS  negative genitourinary   Musculoskeletal negative musculoskeletal ROS (+)   Abdominal   Peds negative pediatric ROS (+)  Hematology negative hematology ROS (+)   Anesthesia Other Findings   Reproductive/Obstetrics negative OB ROS                           Anesthesia Physical Anesthesia Plan  ASA: III  Anesthesia Plan: Spinal   Post-op Pain Management:    Induction:   Airway Management Planned:   Additional Equipment:   Intra-op Plan:   Post-operative Plan:   Informed Consent: I have reviewed the patients History and Physical, chart, labs and discussed the procedure including the risks, benefits and alternatives for the proposed anesthesia with the patient or authorized representative who has indicated his/her understanding and acceptance.   Dental advisory given  Plan Discussed with: CRNA  Anesthesia Plan Comments:         Anesthesia Quick Evaluation

## 2011-12-21 ENCOUNTER — Other Ambulatory Visit: Payer: Self-pay | Admitting: Internal Medicine

## 2011-12-21 LAB — CBC
Hemoglobin: 11.7 g/dL — ABNORMAL LOW (ref 13.0–17.0)
MCH: 33.2 pg (ref 26.0–34.0)
MCHC: 33.7 g/dL (ref 30.0–36.0)
Platelets: 188 10*3/uL (ref 150–400)
RDW: 17.1 % — ABNORMAL HIGH (ref 11.5–15.5)

## 2011-12-21 LAB — BASIC METABOLIC PANEL
Calcium: 8.7 mg/dL (ref 8.4–10.5)
GFR calc Af Amer: 80 mL/min — ABNORMAL LOW (ref >90)
GFR calc non Af Amer: 69 mL/min — ABNORMAL LOW (ref >90)
Glucose, Bld: 140 mg/dL — ABNORMAL HIGH (ref 70–99)
Potassium: 4.5 mEq/L (ref 3.5–5.1)
Sodium: 138 mEq/L (ref 135–145)

## 2011-12-21 LAB — URINALYSIS, ROUTINE W REFLEX MICROSCOPIC
Bilirubin Urine: NEGATIVE
Nitrite: NEGATIVE
Specific Gravity, Urine: 1.024 (ref 1.005–1.030)
pH: 6.5 (ref 5.0–8.0)

## 2011-12-21 MED ORDER — MORPHINE SULFATE 2 MG/ML IJ SOLN: 1.0000 mg | INTRAMUSCULAR | Status: DC | PRN

## 2011-12-21 NOTE — Progress Notes (Signed)
During shift assessment found patient's catheter drainage bag to have what appeared to be frank blood mixed with urine.  Patient reported no discomfort around catheter.  He could not recall it being pulled or moved during the day.  Catheter was still in place and draining. No blood found on bed or on patient. No bruising noticed on scrotum or penis. Paged Rexene Edison PA, order received for UA and culture.  Advised to irrigate foley catheter with normal saline.  Will continue to monitor.

## 2011-12-21 NOTE — Evaluation (Signed)
Physical Therapy Evaluation Patient Details Name: Christopher Baker MRN: [id number] DOB: 04/27/1934 Today's Date: 12/21/2011 Time: 4098-1191 PT Time Calculation (min): 30 min  PT Assessment / Plan / Recommendation Clinical Impression  Pt wtih L TKR presents with decreased L LE strength/ROM and limitations to functional mobiltiy    PT Assessment  Patient needs continued PT services    Follow Up Recommendations  Home health PT    Barriers to Discharge        Equipment Recommendations  None recommended by PT    Recommendations for Other Services OT consult   Frequency 7X/week    Precautions / Restrictions Precautions Precautions: Knee Required Braces or Orthoses: Knee Immobilizer - Left Restrictions Weight Bearing Restrictions: No Other Position/Activity Restrictions: WBAT   Pertinent Vitals/Pain 4/10; ice packs provided      Mobility  Bed Mobility Bed Mobility: Supine to Sit Supine to Sit: 4: Min assist Details for Bed Mobility Assistance: cues for sequence and use of UEs to self assist Transfers Transfers: Sit to Stand;Stand to Sit Sit to Stand: 4: Min assist Stand to Sit: 4: Min assist Details for Transfer Assistance: cues for LE management and use of UEs to self assist Ambulation/Gait Ambulation/Gait Assistance: 4: Min assist Ambulation Distance (Feet): 36 Feet Assistive device: Rolling walker Ambulation/Gait Assistance Details: cues for posture, sequence and position from RW Gait Pattern: Step-to pattern    Exercises Total Joint Exercises Ankle Circles/Pumps: AROM;10 reps;Supine;Both Quad Sets: AROM;Both;10 reps;Supine Heel Slides: AAROM;10 reps;Supine;Left Straight Leg Raises: AAROM;Left;10 reps;Supine   PT Diagnosis: Difficulty walking  PT Problem List: Decreased strength;Decreased range of motion;Decreased activity tolerance;Decreased mobility;Pain;Decreased knowledge of use of DME PT Treatment Interventions:     PT Goals Acute Rehab PT Goals PT Goal  Formulation: With patient Time For Goal Achievement: 12/27/11 Potential to Achieve Goals: Good Pt will go Supine/Side to Sit: with supervision PT Goal: Supine/Side to Sit - Progress: Goal set today Pt will go Sit to Supine/Side: with supervision PT Goal: Sit to Supine/Side - Progress: Goal set today Pt will go Sit to Stand: with supervision PT Goal: Sit to Stand - Progress: Goal set today Pt will go Stand to Sit: with supervision PT Goal: Stand to Sit - Progress: Goal set today Pt will Ambulate: >150 feet;with supervision;with least restrictive assistive device PT Goal: Ambulate - Progress: Goal set today Pt will Go Up / Down Stairs: 3-5 stairs;with min assist;with least restrictive assistive device PT Goal: Up/Down Stairs - Progress: Goal set today  Visit Information  Last PT Received On: 12/21/11 Assistance Needed: +1    Subjective Data  Subjective: I can only bend my other knee to 90 Patient Stated Goal: Play golf again asap   Prior Functioning  Home Living Lives With: Spouse Available Help at Discharge: Family Type of Home: House Home Access: Stairs to enter Secretary/administrator of Steps: 4 Entrance Stairs-Rails: Right Home Layout: One level Home Adaptive Equipment: Environmental consultant - standard Prior Function Level of Independence: Independent Able to Take Stairs?: Yes Driving: Yes Vocation: Retired Musician: No difficulties Dominant Hand: Right    Cognition  Overall Cognitive Status: Appears within functional limits for tasks assessed/performed Arousal/Alertness: Awake/alert Orientation Level: Appears intact for tasks assessed Behavior During Session: [facility] for tasks performed    Extremity/Trunk Assessment Right Upper Extremity Assessment RUE ROM/Strength/Tone: [facility] for tasks assessed Left Upper Extremity Assessment LUE ROM/Strength/Tone: WFL for tasks assessed Right Lower Extremity Assessment RLE ROM/Strength/Tone: Deficits RLE ROM/Strength/Tone  Deficits: 5/5 strength with AROM ltd to -10 -  90 - pt reports multiple surgeries on that knee Left Lower Extremity Assessment LLE ROM/Strength/Tone: Deficits LLE ROM/Strength/Tone Deficits: AAROM at knee -10 - 30 with ++muscle guarding; quads 2+/5   Balance    End of Session PT - End of Session Equipment Utilized During Treatment: Left knee immobilizer Activity Tolerance: Patient tolerated treatment well Patient left: in chair;with call bell/phone within reach;with family/visitor present Nurse Communication: Mobility status CPM Left Knee CPM Left Knee: Off  GP     Laketta Soderberg 12/21/2011, 10:18 AM

## 2011-12-21 NOTE — Progress Notes (Signed)
Physical Therapy Treatment Patient Details Name: Christopher Baker MRN: [id number] DOB: Sep 23, 1933 Today's Date: 12/21/2011 Time: 4098-1191 PT Time Calculation (min): 23 min  PT Assessment / Plan / Recommendation Comments on Treatment Session       Follow Up Recommendations  Home health PT    Barriers to Discharge        Equipment Recommendations  None recommended by PT    Recommendations for Other Services    Frequency 7X/week   Plan Discharge plan remains appropriate    Precautions / Restrictions Precautions Precautions: Knee Required Braces or Orthoses: Knee Immobilizer - Left Knee Immobilizer - Left: Discontinue once straight leg raise with < 10 degree lag Restrictions Weight Bearing Restrictions: No Other Position/Activity Restrictions: WBAT   Pertinent Vitals/Pain 5/10; Pain meds requested    Mobility  Bed Mobility Bed Mobility: Sit to Supine Sit to Supine: 4: Min assist Details for Bed Mobility Assistance: cues for sequence and use of UEs to self assist Transfers Transfers: Sit to Stand;Stand to Sit Sit to Stand: 4: Min assist Stand to Sit: 4: Min assist Details for Transfer Assistance: cues for LE management and use of UEs to self assist Ambulation/Gait Ambulation/Gait Assistance: 4: Min Environmental consultant (Feet): 110 Feet Assistive device: Rolling walker Ambulation/Gait Assistance Details: cues for sequence, stride length and position from RW Gait Pattern: Step-to pattern    Exercises     PT Diagnosis:    PT Problem List:   PT Treatment Interventions:     PT Goals Acute Rehab PT Goals PT Goal Formulation: With patient Time For Goal Achievement: 12/27/11 Potential to Achieve Goals: Good Pt will go Supine/Side to Sit: with supervision PT Goal: Supine/Side to Sit - Progress: Goal set today Pt will go Sit to Supine/Side: with supervision PT Goal: Sit to Supine/Side - Progress: Goal set today Pt will go Sit to Stand: with supervision PT Goal:  Sit to Stand - Progress: Progressing toward goal Pt will go Stand to Sit: with supervision PT Goal: Stand to Sit - Progress: Progressing toward goal Pt will Ambulate: >150 feet;with supervision;with least restrictive assistive device PT Goal: Ambulate - Progress: Progressing toward goal Pt will Go Up / Down Stairs: 3-5 stairs;with min assist;with least restrictive assistive device PT Goal: Up/Down Stairs - Progress: Goal set today  Visit Information  Last PT Received On: 12/21/11 Assistance Needed: +1    Subjective Data  Subjective: I can't just get up on my own to go to bed? Patient Stated Goal: Play golf again asap   Cognition  Overall Cognitive Status: Appears within functional limits for tasks assessed/performed Arousal/Alertness: Awake/alert Orientation Level: Appears intact for tasks assessed Behavior During Session: [facility] for tasks performed    Balance     End of Session PT - End of Session Equipment Utilized During Treatment: Left knee immobilizer Activity Tolerance: Patient tolerated treatment well Patient left: with call bell/phone within reach;with family/visitor present;in bed Nurse Communication: Mobility status CPM Left Knee CPM Left Knee: On Left Knee Flexion (Degrees): 50    GP     Christopher Baker 12/21/2011, 4:30 PM

## 2011-12-21 NOTE — Progress Notes (Signed)
CARE MANAGEMENT NOTE 12/21/2011  Patient:  Christopher Baker, Christopher Baker   Account Number:  [id number]  Date Initiated:  12/21/2011  Documentation initiated by:  Colleen Can  Subjective/Objective Assessment:   dx osteoarthritis left knee; total knee replacement     Action/Plan:   CM spoke with patient and spouse. Plans are for patient to return to his home where spouse will be caregiver. Already has RW but will know tomorrow if he needs coomode seat. [place] for [facility] services   Anticipated DC Date:  12/23/2011   Anticipated DC Plan:  HOME W HOME HEALTH SERVICES  In-house referral  Clinical Social Worker      DC Planning Services  CM consult      [facility]  HOME HEALTH   Choice offered to / List presented to:  C-1 Patient   DME arranged  NA      DME agency  NA     HH arranged  HH-2 PT      [facility] agency  [facility]   Status of service:  In process, will continue to follow

## 2011-12-21 NOTE — Progress Notes (Signed)
Subjective: 1 Day Post-Op Procedure(s) (LRB): TOTAL KNEE ARTHROPLASTY (Left) Patient reports pain as mild.   Patient seen in rounds with Dr. Lequita Halt. Wife in room. Patient is well, and has had no acute complaints or problems We will start therapy today.  Plan is to go Home after hospital stay.  Objective: Vital signs in last 24 hours: Temp:  [97 F (36.1 C)-98.3 F (36.8 C)] 98.3 F (36.8 C) (07/02 0636) Pulse Rate:  [55-95] 64  (07/02 0636) Resp:  [10-18] 14  (07/02 0636) BP: (115-144)/(58-81) 124/64 mmHg (07/02 0636) SpO2:  [94 %-100 %] 97 % (07/02 0636) Weight:  [106.595 kg (235 lb)] 106.595 kg (235 lb) (07/01 1600)  Intake/Output from previous day:  Intake/Output Summary (Last 24 hours) at 12/21/11 0803 Last data filed at 12/21/11 0639  Gross per 24 hour  Intake   3590 ml  Output   2310 ml  Net   1280 ml    Intake/Output this shift:    Labs:  Basename 12/21/11 0406  HGB 11.7*    Basename 12/21/11 0406  WBC 10.5  RBC 3.52*  HCT 34.7*  PLT 188    Basename 12/21/11 0406  NA 138  K 4.5  CL 106  CO2 27  BUN 10  CREATININE 1.02  GLUCOSE 140*  CALCIUM 8.7   No results found for this basename: LABPT:2,INR:2 in the last 72 hours  EXAM General - Patient is Alert, Appropriate and Oriented Extremity - Neurovascular intact Sensation intact distally Dorsiflexion/Plantar flexion intact Dressing - dressing C/D/I and no drainage Motor Function - intact, moving foot and toes well on exam.  Hemovac pulled without difficulty.  Past Medical History  Diagnosis Date  . Hyperlipidemia   . HTN (hypertension)   . Status post placement of cardiac pacemaker DDD-  06-04-10-- LAST CHECK 09-08-10 IN EPIC    SECONDARY TO SYNCOPY AND BRADYCARDIA  . Restless leg syndrome   . Normal nuclear stress test 2008    LOW RISK--   DR  WALL  . Carotid stenosis, bilateral PER DR EARLY / DUPLEX  09-09-10      40 - 59%   BILATERALLY--- ASYMPTOMATIC  . History of echocardiogram  11-21-2006    EF 65%  . History of prostate cancer S/P RADIATION  TX  6 YRS AGO  . Psoriasis   . Arthritis with psoriasis   . GERD (gastroesophageal reflux disease)     CONTROLLED W/ PROTONIX  . Borderline diabetes     DIET CONTROLLED - PT STATES HE IS NOT DIABETIC  . Lumbar spondylosis W/ RADICULOPATHY  . Iron deficiency   . Cataract immature LEFT EYE  . PAF (paroxysmal atrial fibrillation)   . SVT (supraventricular tachycardia)     S/P ABLATION   2008  . Coronary artery disease CARDIOLOGIST- DR Sharrell Ku    09-08-10 VISIT AND PACEMAKER CHECK  IN EPIC  . Cancer     HX OF PROSTATE CANCER--TX'D WITH RADIATION  . Sleep apnea TESTED YRS AGO-- NO CPAP RX GIVEN    PT STATES HE DOES NOT THINK HE STILL HAS SLEEP APNEA--DOES NOT USE CPAP    Assessment/Plan: 1 Day Post-Op Procedure(s) (LRB): TOTAL KNEE ARTHROPLASTY (Left) Principal Problem:  *OA (osteoarthritis) of knee   Advance diet Up with therapy Continue foley due to strict I&O and urinary output monitoring Discharge home with home health  DVT Prophylaxis - Xarelto Weight-Bearing as tolerated to left leg Keep foley until tomorrow. No vaccines. D/C PCA Morphine, Change to IV  push D/C O2 and Pulse OX and try on Room Air  Eldean Nanna 12/21/2011, 8:03 AM

## 2011-12-22 LAB — BASIC METABOLIC PANEL
BUN: 13 mg/dL (ref 6–23)
Chloride: 103 mEq/L (ref 96–112)
GFR calc Af Amer: 68 mL/min — ABNORMAL LOW (ref >90)
Potassium: 4.4 mEq/L (ref 3.5–5.1)

## 2011-12-22 LAB — CBC
HCT: 34.4 % — ABNORMAL LOW (ref 39.0–52.0)
Hemoglobin: 11.5 g/dL — ABNORMAL LOW (ref 13.0–17.0)
RBC: 3.46 MIL/uL — ABNORMAL LOW (ref 4.22–5.81)
RDW: 16.9 % — ABNORMAL HIGH (ref 11.5–15.5)
WBC: 13.9 10*3/uL — ABNORMAL HIGH (ref 4.0–10.5)

## 2011-12-22 MED ORDER — RIVAROXABAN 10 MG PO TABS: 10.0000 mg | ORAL_TABLET | Freq: Every day | ORAL | Status: DC

## 2011-12-22 MED ORDER — PREDNISONE 5 MG PO TABS: 5.0000 mg | ORAL_TABLET | Freq: Every day | ORAL | Status: DC

## 2011-12-22 MED ORDER — HYDROCODONE-ACETAMINOPHEN 5-325 MG PO TABS: 1.0000 | ORAL_TABLET | ORAL | Status: DC | PRN

## 2011-12-22 MED ORDER — METHOCARBAMOL 500 MG PO TABS: 500.0000 mg | ORAL_TABLET | Freq: Four times a day (QID) | ORAL | Status: DC | PRN

## 2011-12-22 MED ORDER — TRAMADOL HCL 50 MG PO TABS: 50.0000 mg | ORAL_TABLET | Freq: Four times a day (QID) | ORAL | Status: DC | PRN

## 2011-12-22 NOTE — Evaluation (Signed)
Occupational Therapy Evaluation Patient Details Name: Christopher Baker MRN: [id number] DOB: December 04, 1933 Today's Date: 12/22/2011 Time: 7829-5621 OT Time Calculation (min): 33 min  OT Assessment / Plan / Recommendation Clinical Impression  This 78 year old male was admitted for L TKA.  He presents at min A level overall for ADLs and mobility.  Will follow in acute to reinforce education about shower transfer/toilet transfer for safe d/c home.      OT Assessment  Patient needs continued OT Services    Follow Up Recommendations  No OT follow up    Barriers to Discharge      Equipment Recommendations  None recommended by OT;None recommended by PT    Recommendations for Other Services    Frequency  Min 2X/week    Precautions / Restrictions Precautions Precautions: Knee Required Braces or Orthoses: Knee Immobilizer - Left Knee Immobilizer - Left: Discontinue once straight leg raise with < 10 degree lag Restrictions Other Position/Activity Restrictions: WBAT   Pertinent Vitals/Pain No pain: ice placed and repositioned for comfort    ADL  Eating/Feeding: Performed;Independent Where Assessed - Eating/Feeding: Bed level Grooming: Simulated;Set up Where Assessed - Grooming: Unsupported sitting Upper Body Bathing: Simulated;Set up Where Assessed - Upper Body Bathing: Unsupported sitting Lower Body Bathing: Simulated;Minimal assistance Where Assessed - Lower Body Bathing: Supported sit to stand Upper Body Dressing: Performed;Set up Where Assessed - Upper Body Dressing: Unsupported sitting Lower Body Dressing: Simulated;Moderate assistance Where Assessed - Lower Body Dressing: Supported sit to stand Toilet Transfer: Performed;Min guard (min cues for leg placement) Toilet Transfer Method: Surveyor, minerals: Materials engineer and Hygiene: Performed;Minimal assistance Where Assessed - Engineer, mining and Hygiene: Sit to  stand from 3-in-1 or toilet Equipment Used: Knee Immobilizer;Rolling walker Transfers/Ambulation Related to ADLs: SPT back to bed only:  pt had vomited while sitting on 3:1 ADL Comments: Has AE but wife will help him with adls at home.  they are in a different house since last surgery, with small walk in shower.  Will plan to practice tomorrow.  Educated wife on placement of 3:1 in shower stall    OT Diagnosis: Generalized weakness  OT Problem List: Decreased strength;Decreased activity tolerance;Decreased knowledge of use of DME or AE OT Treatment Interventions: Self-care/ADL training;Patient/family education;DME and/or AE instruction   OT Goals Acute Rehab OT Goals OT Goal Formulation: With patient/family Time For Goal Achievement: 12/29/11 Potential to Achieve Goals: Good ADL Goals Pt Will Perform Lower Body Bathing: with min assist;Sit to stand in shower;with adaptive equipment (min guard) ADL Goal: Lower Body Bathing - Progress: Goal set today Pt Will Transfer to Toilet: with supervision;Ambulation;3-in-1 ADL Goal: Toilet Transfer - Progress: Goal set today Pt Will Perform Tub/Shower Transfer: Shower transfer;with min assist;with DME (min guard with 3:1) ADL Goal: Tub/Shower Transfer - Progress: Goal set today  Visit Information  Last OT Received On: 12/22/11 Assistance Needed: +1    Subjective Data  Subjective: "I need a nurse"   Prior Functioning  Home Living Lives With: Spouse Available Help at Discharge: Family Type of Home: House Home Access: Stairs to enter Secretary/administrator of Steps: 4 Entrance Stairs-Rails: Right Home Layout: One level Bathroom Shower/Tub: Health visitor: Standard (3:1) Prior Function Level of Independence: Independent Communication Communication: No difficulties Dominant Hand: Right    Cognition  Overall Cognitive Status: Appears within functional limits for tasks assessed/performed Arousal/Alertness:  Awake/alert Orientation Level: Appears intact for tasks assessed    Extremity/Trunk Assessment Right Upper  Extremity Assessment RUE ROM/Strength/Tone: [facility] for tasks assessed Left Upper Extremity Assessment LUE ROM/Strength/Tone: WFL for tasks assessed   Mobility Bed Mobility Sit to Supine: 4: Min assist Transfers Sit to Stand: 4: Min guard;With upper extremity assist;From chair/3-in-1   Exercise    Balance    End of Session OT - End of Session Activity Tolerance: Patient limited by fatigue Patient left: in bed;with call bell/phone within reach;with family/visitor present CPM Left Knee CPM Left Knee: Off  GO     Erinn Huskins 12/22/2011, 8:53 AM Marica Otter, OTR/L [phone] 12/22/2011

## 2011-12-22 NOTE — Progress Notes (Signed)
Foley catheter output still appears bloody, patient is reporting bladder distention.  Paged Rexene Edison PA, order received to remove foley catheter and allow patient to use urinal.  100 cc emptied from catheter following removal and patient voided 200 cc.  Will continue to monitor.

## 2011-12-22 NOTE — Progress Notes (Signed)
Physical Therapy Treatment Patient Details Name: Christopher Baker MRN: [id number] DOB: 1933-10-03 Today's Date: 12/22/2011 Time: 4098-1191 PT Time Calculation (min): 25 min  PT Assessment / Plan / Recommendation Comments on Treatment Session  PT c/o dizziness with ambulation; BP 122/66, RN aware    Follow Up Recommendations  Home health PT    Barriers to Discharge        Equipment Recommendations  None recommended by OT;None recommended by PT    Recommendations for Other Services OT consult  Frequency 7X/week   Plan Discharge plan remains appropriate    Precautions / Restrictions Precautions Precautions: Knee Required Braces or Orthoses: Knee Immobilizer - Left Knee Immobilizer - Left: Discontinue once straight leg raise with < 10 degree lag Restrictions Weight Bearing Restrictions: No Other Position/Activity Restrictions: WBAT   Pertinent Vitals/Pain 3/10     Mobility  Bed Mobility Bed Mobility: Supine to Sit;Sit to Supine Supine to Sit: 4: Min assist Sit to Supine: 4: Min assist Details for Bed Mobility Assistance: min assist for L LE, min cues for sequence Transfers Transfers: Sit to Stand;Stand to Sit Sit to Stand: 4: Min assist;With upper extremity assist;From bed;From chair/3-in-1 Stand to Sit: 4: Min guard;To bed;To chair/3-in-1;With armrests;With upper extremity assist Details for Transfer Assistance: cues for LE management and use of UEs to self assist Ambulation/Gait Ambulation/Gait Assistance: 4: Min assist;4: Min guard Ambulation Distance (Feet): 135 Feet Assistive device: Rolling walker Ambulation/Gait Assistance Details: cues for position from RW and posture Gait Pattern: Step-to pattern    Exercises     PT Diagnosis:    PT Problem List:   PT Treatment Interventions:     PT Goals Acute Rehab PT Goals PT Goal Formulation: With patient Time For Goal Achievement: 12/27/11 Potential to Achieve Goals: Good Pt will go Supine/Side to Sit: with  supervision PT Goal: Supine/Side to Sit - Progress: Progressing toward goal Pt will go Sit to Supine/Side: with supervision PT Goal: Sit to Supine/Side - Progress: Progressing toward goal Pt will go Sit to Stand: with supervision PT Goal: Sit to Stand - Progress: Progressing toward goal Pt will go Stand to Sit: with supervision PT Goal: Stand to Sit - Progress: Progressing toward goal Pt will Ambulate: >150 feet;with supervision;with least restrictive assistive device PT Goal: Ambulate - Progress: Progressing toward goal  Visit Information  Last PT Received On: 12/22/11 Assistance Needed: +1    Subjective Data  Subjective: Lets do it Patient Stated Goal: Play golf again asap   Cognition  Overall Cognitive Status: Appears within functional limits for tasks assessed/performed Arousal/Alertness: Awake/alert Orientation Level: Appears intact for tasks assessed Behavior During Session: [facility] for tasks performed    Balance     End of Session PT - End of Session Equipment Utilized During Treatment: Left knee immobilizer Activity Tolerance: Other (comment) (c/o dizziness with ambulation) Patient left: in bed;with call bell/phone within reach Nurse Communication: Mobility status CPM Left Knee CPM Left Knee: On   GP     Christopher Baker 12/22/2011, 4:03 PM

## 2011-12-22 NOTE — Progress Notes (Signed)
Physical Therapy Treatment Patient Details Name: Christopher Baker MRN: [id number] DOB: Jun 26, 1933 Today's Date: 12/22/2011 Time: 5621-3086 PT Time Calculation (min): 40 min  PT Assessment / Plan / Recommendation Comments on Treatment Session  Pt ltd in gait by quad weakness and in ROM by ++muscle guarding    Follow Up Recommendations  Home health PT    Barriers to Discharge        Equipment Recommendations  None recommended by OT;None recommended by PT    Recommendations for Other Services OT consult  Frequency 7X/week   Plan Discharge plan remains appropriate    Precautions / Restrictions Precautions Precautions: Knee Required Braces or Orthoses: Knee Immobilizer - Left Knee Immobilizer - Left: Discontinue once straight leg raise with < 10 degree lag Restrictions Weight Bearing Restrictions: No Other Position/Activity Restrictions: WBAT   Pertinent Vitals/Pain Min pain at rest; 5/10 with gait, 8/10 with knee flex; Pt premedicated, cold packs provided    Mobility  Bed Mobility Bed Mobility: Supine to Sit Supine to Sit: 4: Min assist Sit to Supine: 4: Min assist Details for Bed Mobility Assistance: cues for sequence and use of UEs to self assist Transfers Transfers: Sit to Stand;Stand to Sit Sit to Stand: 4: Min assist Stand to Sit: 4: Min guard Details for Transfer Assistance: cues for LE management and use of UEs to self assist Ambulation/Gait Ambulation/Gait Assistance: 4: Min assist;4: Min guard Ambulation Distance (Feet): 128 Feet Assistive device: Rolling walker Ambulation/Gait Assistance Details: cues for posture and position from RW Gait Pattern: Step-to pattern    Exercises Total Joint Exercises Ankle Circles/Pumps: AROM;20 reps;Supine;Both Quad Sets: AROM;20 reps;Supine;Both Heel Slides: AAROM;20 reps;Supine;Left Straight Leg Raises: AAROM;20 reps;Left;Supine   PT Diagnosis:    PT Problem List:   PT Treatment Interventions:     PT Goals Acute Rehab  PT Goals PT Goal Formulation: With patient Time For Goal Achievement: 12/27/11 Potential to Achieve Goals: Good Pt will go Supine/Side to Sit: with supervision PT Goal: Supine/Side to Sit - Progress: Progressing toward goal Pt will go Sit to Supine/Side: with supervision PT Goal: Sit to Supine/Side - Progress: Progressing toward goal Pt will go Sit to Stand: with supervision PT Goal: Sit to Stand - Progress: Progressing toward goal Pt will go Stand to Sit: with supervision PT Goal: Stand to Sit - Progress: Progressing toward goal Pt will Ambulate: >150 feet;with supervision;with least restrictive assistive device PT Goal: Ambulate - Progress: Progressing toward goal  Visit Information  Last PT Received On: 12/22/11 Assistance Needed: +1    Subjective Data  Subjective: My leg is so weak, my arms get tired walking Patient Stated Goal: Play golf again asap   Cognition  Overall Cognitive Status: Appears within functional limits for tasks assessed/performed Arousal/Alertness: Awake/alert Orientation Level: Appears intact for tasks assessed Behavior During Session: [facility] for tasks performed    Balance     End of Session PT - End of Session Equipment Utilized During Treatment: Left knee immobilizer Activity Tolerance: Patient tolerated treatment well Patient left: in chair;with call bell/phone within reach;with family/visitor present Nurse Communication: Mobility status CPM Left Knee CPM Left Knee: Off   GP     [name] 12/22/2011, 11:53 AM

## 2011-12-22 NOTE — Progress Notes (Signed)
Subjective: 2 Days Post-Op Procedure(s) (LRB): TOTAL KNEE ARTHROPLASTY (Left) Patient reports pain as mild and moderate.   Patient had a rough night.  Wife in room.  He had some intermittent confusion likely due to the narcotics.  Also had blood in the foley.  Removed and he is voiding on his own.  UA was ordered last night.   Will monitor his output and hopefully the urine will continue to clear up. Patient is well, but has had some minor complaints of hematuria and confusion last night.  he is better this morning. Plan is to go Home after hospital stay.  Objective: Vital signs in last 24 hours: Temp:  [97.9 F (36.6 C)-99.2 F (37.3 C)] 99.2 F (37.3 C) (07/03 0549) Pulse Rate:  [76-93] 90  (07/03 0549) Resp:  [16] 16  (07/03 0549) BP: (132-170)/(64-78) 144/65 mmHg (07/03 0549) SpO2:  [95 %-98 %] 95 % (07/03 0549)  Intake/Output from previous day:  Intake/Output Summary (Last 24 hours) at 12/22/11 0814 Last data filed at 12/22/11 0549  Gross per 24 hour  Intake    610 ml  Output   3300 ml  Net  -2690 ml    Intake/Output this shift:    Labs: URINALYSIS         12/16/11       12/21/11  Color, Urine   YELLOW       AMBER     APPearance   CLEAR       CLOUDY    Specific Gravity, Urine   1.020       1.024    pH   6.0       6.5    Glucose, UA   NEGATIVE       NEGATIVE    Bilirubin Urine   NEGATIVE       NEGATIVE    Ketones, ur   NEGATIVE       NEGATIVE    Protein, ur   NEGATIVE       300 mg/dL">300    Urobilinogen, UA   0.2       1.0    Nitrite   NEGATIVE       NEGATIVE    Leukocytes, UA   NEGATIVE       SMALL    Hgb urine dipstick   TRACE       LARGE    Urine-Other   MUCOUS PRESENT       FIELD OBSCURED BY RBC'S     WBC, UA   0-2       21-50    RBC / HPF   0-2       TOO NUMEROUS TO COUNT    Squamous Epithelial / LPF   RARE           Bacteria, UA   RARE              Basename 12/22/11 0400 12/21/11 0406  HGB 11.5* 11.7*    Basename 12/22/11 0400 12/21/11 0406    WBC 13.9* 10.5  RBC 3.46* 3.52*  HCT 34.4* 34.7*  PLT 194 188    Basename 12/22/11 0400 12/21/11 0406  NA 136 138  K 4.4 4.5  CL 103 106  CO2 27 27  BUN 13 10  CREATININE 1.17 1.02  GLUCOSE 126* 140*  CALCIUM 9.0 8.7   No results found for this basename: LABPT:2,INR:2 in the last 72 hours  EXAM General - Patient is Alert, Appropriate and Oriented Extremity -  Neurovascular intact Sensation intact distally Dorsiflexion/Plantar flexion intact Dressing/Incision - clean, dry, no drainage, healing Motor Function - intact, moving foot and toes well on exam.   Past Medical History  Diagnosis Date  . Hyperlipidemia   . HTN (hypertension)   . Status post placement of cardiac pacemaker DDD-  06-04-10-- LAST CHECK 09-08-10 IN EPIC    SECONDARY TO SYNCOPY AND BRADYCARDIA  . Restless leg syndrome   . Normal nuclear stress test 2008    LOW RISK--   DR  WALL  . Carotid stenosis, bilateral PER DR EARLY / DUPLEX  09-09-10      40 - 59%   BILATERALLY--- ASYMPTOMATIC  . History of echocardiogram 11-21-2006    EF 65%  . History of prostate cancer S/P RADIATION  TX  6 YRS AGO  . Psoriasis   . Arthritis with psoriasis   . GERD (gastroesophageal reflux disease)     CONTROLLED W/ PROTONIX  . Borderline diabetes     DIET CONTROLLED - PT STATES HE IS NOT DIABETIC  . Lumbar spondylosis W/ RADICULOPATHY  . Iron deficiency   . Cataract immature LEFT EYE  . PAF (paroxysmal atrial fibrillation)   . SVT (supraventricular tachycardia)     S/P ABLATION   2008  . Coronary artery disease CARDIOLOGIST- DR Sharrell Ku    09-08-10 VISIT AND PACEMAKER CHECK  IN EPIC  . Cancer     HX OF PROSTATE CANCER--TX'D WITH RADIATION  . Sleep apnea TESTED YRS AGO-- NO CPAP RX GIVEN    PT STATES HE DOES NOT THINK HE STILL HAS SLEEP APNEA--DOES NOT USE CPAP    Assessment/Plan: 2 Days Post-Op Procedure(s) (LRB): TOTAL KNEE ARTHROPLASTY (Left) Principal Problem:  *OA (osteoarthritis) of knee   [facility]  diet Up with therapy Plan for discharge tomorrow Discharge home with home health  DVT Prophylaxis - Xarelto Weight-Bearing as tolerated to left leg Monitor urine and make sure the blood clears up Reduce the narcotics. D/C Oxy and use Ultram with Norco as a backup.  Gardiner Espana 12/22/2011, 8:14 AM

## 2011-12-23 LAB — CBC
Hemoglobin: 10.6 g/dL — ABNORMAL LOW (ref 13.0–17.0)
MCH: 33.4 pg (ref 26.0–34.0)
MCHC: 34.2 g/dL (ref 30.0–36.0)
Platelets: 207 10*3/uL (ref 150–400)
RBC: 3.17 MIL/uL — ABNORMAL LOW (ref 4.22–5.81)

## 2011-12-23 LAB — URINE CULTURE

## 2011-12-23 NOTE — Care Management Note (Signed)
    Page 1 of 1   12/23/2011     12:10:39 PM   CARE MANAGEMENT NOTE 12/23/2011  Patient:  Christopher Baker, Christopher Baker   Account Number:  [id number]  Date Initiated:  12/21/2011  Documentation initiated by:  Colleen Can  Subjective/Objective Assessment:   dx osteoarthritis left knee; total knee replacement     Action/Plan:   CM spoke with patient and spouse. Plans are for patient to return to his home where spouse will be caregiver. Already has RW but will know tomorrow if he needs coomode seat. WaNTS Genevieve Norlander for [facility] services   Anticipated DC Date:  12/23/2011   Anticipated DC Plan:  HOME W HOME HEALTH SERVICES  In-house referral  Clinical Social Worker      DC Planning Services  CM consult      [facility] Choice  HOME HEALTH   Choice offered to / List presented to:  C-1 Patient   DME arranged  3-N-1      DME agency  [facility]     HH arranged  HH-2 PT      [facility] agency  [facility]   Status of service:  Completed, signed off Medicare Important Message given?   (If response is "NO", the following Medicare IM given date fields will be blank) Date Medicare IM given:   Date Additional Medicare IM given:    Discharge Disposition:  HOME W HOME HEALTH SERVICES  Per UR Regulation:    If discussed at Long Length of Stay Meetings, dates discussed:    Comments:  12/23/11 In to see patient for CM introduction and 3N1. Referral called to Darl Pikes with [facility], pt and wife aware to wait for delivery.  Jim Like RN CCM [facility]

## 2011-12-23 NOTE — Progress Notes (Signed)
Occupational Therapy Treatment Patient Details Name: Christopher Baker MRN: [id number] DOB: Feb 09, 1934 Today's Date: 12/23/2011 Time: 7846-9629 OT Time Calculation (min): 29 min  OT Assessment / Plan / Recommendation Comments on Treatment Session      Follow Up Recommendations  No OT follow up    Barriers to Discharge       Equipment Recommendations  3 in 1 bedside comode (had an old commode but non-functional--broken leg on commode)    Recommendations for Other Services    Frequency Min 2X/week   Plan      Precautions / Restrictions Precautions Precautions: Knee Required Braces or Orthoses: Knee Immobilizer - Left Knee Immobilizer - Left: Discontinue once straight leg raise with < 10 degree lag (Pt performed IND SLR this am) Restrictions Weight Bearing Restrictions: No Other Position/Activity Restrictions: WBAT   Pertinent Vitals/Pain No pain    ADL  Lower Body Bathing: Performed;Moderate assistance Where Assessed - Lower Body Bathing: Unsupported sitting (leaned and washed peri areas standing at sink due to size of shower stall) Toilet Transfer: Performed;Supervision/safety Toilet Transfer Method:  (ambulate) Toilet Transfer Equipment: Raised toilet seat with arms (or 3-in-1 over toilet) Tub/Shower Transfer: Performed;Min guard Tub/Shower Transfer Method: Science writer: Walk in shower (3:1) Transfers/Ambulation Related to ADLs: moving well; KI discontinued today ADL Comments: Discussed alternatives with pt/family.  Will wash peri area at sink before/after shower due to 34 x 34" shower.  Checked with RN.  Pt is on a blood thinner--educated to use electric razor   OT Diagnosis:    OT Problem List:   OT Treatment Interventions:     OT Goals Acute Rehab OT Goals Time For Goal Achievement: 12/29/11 ADL Goals Pt Will Perform Lower Body Bathing: with min assist;Sit to stand in shower;with adaptive equipment ADL Goal: Lower Body Bathing -  Progress: Progressing toward goals Pt Will Transfer to Toilet: with supervision;Ambulation;3-in-1 ADL Goal: Toilet Transfer - Progress: Met Pt Will Perform Tub/Shower Transfer: Shower transfer;with min assist;with DME ADL Goal: Tub/Shower Transfer - Progress: Met  Visit Information  Last OT Received On: 12/23/11 Assistance Needed: +1    Subjective Data      Prior Functioning       Cognition  Overall Cognitive Status: Appears within functional limits for tasks assessed/performed Arousal/Alertness: Awake/alert Orientation Level: Appears intact for tasks assessed Behavior During Session: [facility] for tasks performed    Mobility Bed Mobility Bed Mobility: Supine to Sit Supine to Sit: 4: Min assist Transfers Sit to Stand: 5: Supervision   Exercises    Balance    End of Session OT - End of Session Activity Tolerance: Patient tolerated treatment well Patient left: in chair;with call bell/phone within reach;with family/visitor present CPM Left Knee CPM Left Knee: Off  GO     Emila Steinhauser 12/23/2011, 10:37 AM Marica Otter, OTR/L [phone] 12/23/2011

## 2011-12-23 NOTE — Progress Notes (Signed)
Subjective: 3 Days Post-Op Procedure(s) (LRB): TOTAL KNEE ARTHROPLASTY (Left) Patient reports pain as mild.   Denies CP or SOB.  Voiding without difficulty. Positive flatus. Objective: Vital signs in last 24 hours: Temp:  [99 F (37.2 C)-99.3 F (37.4 C)] 99.3 F (37.4 C) (07/04 0516) Pulse Rate:  [75-104] 75  (07/04 0516) Resp:  [16-18] 18  (07/04 0516) BP: (146-164)/(71-80) 146/74 mmHg (07/04 0516) SpO2:  [91 %-97 %] 91 % (07/04 0516)  Intake/Output from previous day: 07/03 0701 - 07/04 0700 In: 1080 [P.O.:1080] Out: 1475 [Urine:1475] Intake/Output this shift:     Basename 12/23/11 0344 12/22/11 0400 12/21/11 0406  HGB 10.6* 11.5* 11.7*    Basename 12/23/11 0344 12/22/11 0400  WBC 14.5* 13.9*  RBC 3.17* 3.46*  HCT 31.0* 34.4*  PLT 207 194    Basename 12/22/11 0400 12/21/11 0406  NA 136 138  K 4.4 4.5  CL 103 106  CO2 27 27  BUN 13 10  CREATININE 1.17 1.02  GLUCOSE 126* 140*  CALCIUM 9.0 8.7   No results found for this basename: LABPT:2,INR:2 in the last 72 hours  Neurologically intact Neurovascular intact Sensation intact distally Dorsiflexion/Plantar flexion intact Incision: scant drainage Compartment soft  Assessment/Plan: 3 Days Post-Op Procedure(s) (LRB): TOTAL KNEE ARTHROPLASTY (Left) Discharge home with home health  Christopher Baker 12/23/2011, 7:35 AM

## 2011-12-23 NOTE — Progress Notes (Signed)
Physical Therapy Treatment Patient Details Name: Christopher Baker MRN: [id number] DOB: Sep 16, 1933 Today's Date: 12/23/2011 Time: 1191-4782 PT Time Calculation (min): 41 min  PT Assessment / Plan / Recommendation Comments on Treatment Session  No c/o dizziness this am    Follow Up Recommendations  Home health PT    Barriers to Discharge        Equipment Recommendations  3 in 1 bedside comode (had an old commode but non-functional)    Recommendations for Other Services OT consult  Frequency 7X/week   Plan Discharge plan remains appropriate    Precautions / Restrictions Precautions Precautions: Knee Required Braces or Orthoses: Knee Immobilizer - Left Knee Immobilizer - Left: Discontinue once straight leg raise with < 10 degree lag (Pt performed IND SLR this am) Restrictions Weight Bearing Restrictions: No Other Position/Activity Restrictions: WBAT   Pertinent Vitals/Pain 3/10; pt premedicated, cold packs applied    Mobility  Bed Mobility Bed Mobility: Supine to Sit Supine to Sit: 4: Min assist Details for Bed Mobility Assistance: min assist for L LE, min cues for sequence Transfers Transfers: Sit to Stand;Stand to Sit Sit to Stand: 5: Supervision Stand to Sit: 4: Min guard;To chair/3-in-1;With armrests;With upper extremity assist Details for Transfer Assistance: cues for LE management and use of UEs to self assist Ambulation/Gait Ambulation/Gait Assistance: 4: Min guard Ambulation Distance (Feet): 222 Feet Assistive device: Rolling walker Ambulation/Gait Assistance Details: cues for position from RW; pt ambulating sans KI - performed IND SLR this am Gait Pattern: Step-to pattern    Exercises Total Joint Exercises Ankle Circles/Pumps: AROM;20 reps;Supine;Both Quad Sets: AROM;20 reps;Supine;Both Short Arc Quad: AAROM;AROM;Left;Supine;10 reps Heel Slides: AAROM;20 reps;Supine;Left Straight Leg Raises: AAROM;AROM;20 reps;Supine;Left   PT Diagnosis:    PT Problem List:    PT Treatment Interventions:     PT Goals Acute Rehab PT Goals PT Goal Formulation: With patient Time For Goal Achievement: 12/27/11 Potential to Achieve Goals: Good Pt will go Supine/Side to Sit: with supervision PT Goal: Supine/Side to Sit - Progress: Progressing toward goal Pt will go Sit to Supine/Side: with supervision PT Goal: Sit to Supine/Side - Progress: Progressing toward goal Pt will go Sit to Stand: with supervision PT Goal: Sit to Stand - Progress: Progressing toward goal Pt will go Stand to Sit: with supervision PT Goal: Stand to Sit - Progress: Progressing toward goal Pt will Ambulate: >150 feet;with supervision;with least restrictive assistive device PT Goal: Ambulate - Progress: Progressing toward goal  Visit Information  Last PT Received On: 12/23/11 Assistance Needed: +1    Subjective Data  Subjective: I am doing much better and ready to go home Patient Stated Goal: Play golf again asap   Cognition  Overall Cognitive Status: Appears within functional limits for tasks assessed/performed Arousal/Alertness: Awake/alert Orientation Level: Appears intact for tasks assessed Behavior During Session: [facility] for tasks performed    Balance     End of Session PT - End of Session Activity Tolerance: Patient tolerated treatment well Patient left: in chair;with call bell/phone within reach;with family/visitor present Nurse Communication: Mobility status CPM Left Knee CPM Left Knee: Off   GP     Christopher Baker 12/23/2011, 10:50 AM

## 2011-12-23 NOTE — Progress Notes (Signed)
Physical Therapy Treatment Patient Details Name: Christopher Baker MRN: [id number] DOB: August 26, 1933 Today's Date: 12/23/2011 Time: 0910-0926 PT Time Calculation (min): 16 min  PT Assessment / Plan / Recommendation Comments on Treatment Session  No c/o dizziness this am    Follow Up Recommendations  Home health PT    Barriers to Discharge        Equipment Recommendations  3 in 1 bedside comode (had an old commode but non-functional)    Recommendations for Other Services OT consult  Frequency 7X/week   Plan Discharge plan remains appropriate    Precautions / Restrictions Precautions Precautions: Knee Required Braces or Orthoses: Knee Immobilizer - Left Knee Immobilizer - Left: Discontinue once straight leg raise with < 10 degree lag Restrictions Weight Bearing Restrictions: No Other Position/Activity Restrictions: WBAT   Pertinent Vitals/Pain 3/10    Mobility  Bed Mobility Bed Mobility: Supine to Sit Supine to Sit: 4: Min assist Details for Bed Mobility Assistance: min assist for L LE, min cues for sequence Transfers Transfers: Sit to Stand;Stand to Sit Sit to Stand: 5: Supervision Stand to Sit: 5: Supervision Details for Transfer Assistance: min cues for LE management Ambulation/Gait Ambulation/Gait Assistance: 4: Min guard;5: Supervision Ambulation Distance (Feet): 60 Feet Assistive device: Rolling walker Ambulation/Gait Assistance Details: min cues for position from RW Gait Pattern: Step-to pattern Stairs: Yes Stairs Assistance: 4: Min assist Stairs Assistance Details (indicate cue type and reason): cues for sequence and for placement of foot/RW/crutch Stair Management Technique: One rail Right;Step to pattern;Forwards;With crutches (and up/down one with RW and min assist bkwd) Number of Stairs: 2  (x2 with spouse helping on 2nd attempt; and 1 bkwd with rw)    Exercises Total Joint Exercises Ankle Circles/Pumps: AROM;20 reps;Supine;Both Quad Sets: AROM;20  reps;Supine;Both Short Arc Quad: AAROM;AROM;Left;Supine;10 reps Heel Slides: AAROM;20 reps;Supine;Left Straight Leg Raises: AAROM;AROM;20 reps;Supine;Left   PT Diagnosis:    PT Problem List:   PT Treatment Interventions:     PT Goals Acute Rehab PT Goals PT Goal Formulation: With patient Time For Goal Achievement: 12/27/11 Potential to Achieve Goals: Good Pt will go Supine/Side to Sit: with supervision PT Goal: Supine/Side to Sit - Progress: Progressing toward goal Pt will go Sit to Supine/Side: with supervision PT Goal: Sit to Supine/Side - Progress: Progressing toward goal Pt will go Sit to Stand: with supervision PT Goal: Sit to Stand - Progress: Met Pt will go Stand to Sit: with supervision PT Goal: Stand to Sit - Progress: Met Pt will Ambulate: >150 feet;with supervision;with least restrictive assistive device PT Goal: Ambulate - Progress: Progressing toward goal Pt will Go Up / Down Stairs: 3-5 stairs;with min assist;with least restrictive assistive device PT Goal: Up/Down Stairs - Progress: Met  Visit Information  Last PT Received On: 12/23/11 Assistance Needed: +1    Subjective Data  Subjective: No new complaints Patient Stated Goal: Play golf again asap   Cognition  Overall Cognitive Status: Appears within functional limits for tasks assessed/performed Arousal/Alertness: Awake/alert Orientation Level: Appears intact for tasks assessed Behavior During Session: [facility] for tasks performed    Balance     End of Session PT - End of Session Equipment Utilized During Treatment: Gait belt Activity Tolerance: Patient tolerated treatment well Patient left: in chair;with call bell/phone within reach;with family/visitor present Nurse Communication: Mobility status CPM Left Knee CPM Left Knee: Off   GP     Christopher Baker 12/23/2011, 10:56 AM

## 2011-12-27 ENCOUNTER — Inpatient Hospital Stay (HOSPITAL_COMMUNITY)
Admission: AD | Admit: 2011-12-27 | Discharge: 2011-12-29 | DRG: 603 | Disposition: A | Discharge status: Home / Self Care | Payer: Medicare Other | Source: Ambulatory Visit | Attending: Orthopedic Surgery | Admitting: Orthopedic Surgery

## 2011-12-27 ENCOUNTER — Encounter (HOSPITAL_COMMUNITY): Payer: Self-pay | Admitting: Orthopedic Surgery

## 2011-12-27 ENCOUNTER — Other Ambulatory Visit: Payer: Self-pay | Admitting: Surgical

## 2011-12-27 DIAGNOSIS — Z9079 Acquired absence of other genital organ(s): Secondary | ICD-10-CM

## 2011-12-27 DIAGNOSIS — E785 Hyperlipidemia, unspecified: Secondary | ICD-10-CM | POA: Diagnosis present

## 2011-12-27 DIAGNOSIS — I10 Essential (primary) hypertension: Secondary | ICD-10-CM | POA: Diagnosis present

## 2011-12-27 DIAGNOSIS — L03119 Cellulitis of unspecified part of limb: Principal | ICD-10-CM | POA: Diagnosis present

## 2011-12-27 DIAGNOSIS — K219 Gastro-esophageal reflux disease without esophagitis: Secondary | ICD-10-CM | POA: Diagnosis present

## 2011-12-27 DIAGNOSIS — Z8546 Personal history of malignant neoplasm of prostate: Secondary | ICD-10-CM

## 2011-12-27 DIAGNOSIS — Z950 Presence of cardiac pacemaker: Secondary | ICD-10-CM

## 2011-12-27 DIAGNOSIS — Z79899 Other long term (current) drug therapy: Secondary | ICD-10-CM

## 2011-12-27 DIAGNOSIS — L02419 Cutaneous abscess of limb, unspecified: Principal | ICD-10-CM | POA: Diagnosis present

## 2011-12-27 DIAGNOSIS — I4891 Unspecified atrial fibrillation: Secondary | ICD-10-CM | POA: Diagnosis present

## 2011-12-27 DIAGNOSIS — I2510 Atherosclerotic heart disease of native coronary artery without angina pectoris: Secondary | ICD-10-CM | POA: Diagnosis present

## 2011-12-27 DIAGNOSIS — Z96659 Presence of unspecified artificial knee joint: Secondary | ICD-10-CM

## 2011-12-27 DIAGNOSIS — IMO0001 Reserved for inherently not codable concepts without codable children: Secondary | ICD-10-CM

## 2011-12-27 DIAGNOSIS — F17200 Nicotine dependence, unspecified, uncomplicated: Secondary | ICD-10-CM | POA: Diagnosis present

## 2011-12-27 DIAGNOSIS — D509 Iron deficiency anemia, unspecified: Secondary | ICD-10-CM

## 2011-12-27 DIAGNOSIS — G4730 Sleep apnea, unspecified: Secondary | ICD-10-CM | POA: Diagnosis present

## 2011-12-27 DIAGNOSIS — G2581 Restless legs syndrome: Secondary | ICD-10-CM | POA: Diagnosis present

## 2011-12-27 DIAGNOSIS — E119 Type 2 diabetes mellitus without complications: Secondary | ICD-10-CM | POA: Diagnosis present

## 2011-12-27 LAB — DIFFERENTIAL
Basophils Absolute: 0 10*3/uL (ref 0.0–0.1)
Basophils Relative: 0 % (ref 0–1)
Eosinophils Absolute: 0.1 10*3/uL (ref 0.0–0.7)
Eosinophils Relative: 1 % (ref 0–5)
Lymphocytes Relative: 13 % (ref 12–46)
Lymphs Abs: 1.7 10*3/uL (ref 0.7–4.0)
Monocytes Absolute: 1.7 10*3/uL — ABNORMAL HIGH (ref 0.1–1.0)
Monocytes Relative: 13 % — ABNORMAL HIGH (ref 3–12)
Neutro Abs: 9.7 10*3/uL — ABNORMAL HIGH (ref 1.7–7.7)
Neutrophils Relative %: 73 % (ref 43–77)

## 2011-12-27 LAB — CBC
HCT: 35.2 % — ABNORMAL LOW (ref 39.0–52.0)
Hemoglobin: 11.6 g/dL — ABNORMAL LOW (ref 13.0–17.0)
MCH: 33.4 pg (ref 26.0–34.0)
MCHC: 33 g/dL (ref 30.0–36.0)
MCV: 101.4 fL — ABNORMAL HIGH (ref 78.0–100.0)
Platelets: 428 10*3/uL — ABNORMAL HIGH (ref 150–400)
RBC: 3.47 MIL/uL — ABNORMAL LOW (ref 4.22–5.81)
RDW: 17.2 % — ABNORMAL HIGH (ref 11.5–15.5)
WBC: 13.2 10*3/uL — ABNORMAL HIGH (ref 4.0–10.5)

## 2011-12-27 LAB — COMPREHENSIVE METABOLIC PANEL WITH GFR
ALT: 138 U/L — ABNORMAL HIGH (ref 0–53)
AST: 66 U/L — ABNORMAL HIGH (ref 0–37)
Albumin: 3.3 g/dL — ABNORMAL LOW (ref 3.5–5.2)
Alkaline Phosphatase: 116 U/L (ref 39–117)
BUN: 24 mg/dL — ABNORMAL HIGH (ref 6–23)
CO2: 24 meq/L (ref 19–32)
Calcium: 9.7 mg/dL (ref 8.4–10.5)
Chloride: 101 meq/L (ref 96–112)
Creatinine, Ser: 1.3 mg/dL (ref 0.50–1.35)
GFR calc Af Amer: 60 mL/min — ABNORMAL LOW
GFR calc non Af Amer: 52 mL/min — ABNORMAL LOW
Glucose, Bld: 120 mg/dL — ABNORMAL HIGH (ref 70–99)
Potassium: 4.3 meq/L (ref 3.5–5.1)
Sodium: 136 meq/L (ref 135–145)
Total Bilirubin: 0.9 mg/dL (ref 0.3–1.2)
Total Protein: 6.7 g/dL (ref 6.0–8.3)

## 2011-12-27 LAB — PROTIME-INR
INR: 1.38 (ref 0.00–1.49)
Prothrombin Time: 17.2 seconds — ABNORMAL HIGH (ref 11.6–15.2)

## 2011-12-27 LAB — APTT: aPTT: 32 seconds (ref 24–37)

## 2011-12-27 MED ORDER — TEMAZEPAM 15 MG PO CAPS: 30.0000 mg | ORAL_CAPSULE | Freq: Every day | ORAL | Status: DC

## 2011-12-27 MED ORDER — SODIUM CHLORIDE 0.9 % IV SOLN: 250.0000 mL | INTRAVENOUS | Status: DC | PRN

## 2011-12-27 MED ORDER — FAMOTIDINE 20 MG PO TABS
20.0000 mg | ORAL_TABLET | ORAL | Status: DC | PRN
  Filled 2011-12-27: qty 1

## 2011-12-27 MED ORDER — SODIUM CHLORIDE 0.9 % IJ SOLN: 3.0000 mL | INTRAMUSCULAR | Status: DC | PRN

## 2011-12-27 MED ORDER — PANTOPRAZOLE SODIUM 40 MG PO TBEC
40.0000 mg | DELAYED_RELEASE_TABLET | Freq: Two times a day (BID) | ORAL | Status: DC
  Administered 2011-12-28 – 2011-12-29 (×3): 40 mg via ORAL
  Filled 2011-12-27 (×6): qty 1

## 2011-12-27 MED ORDER — VANCOMYCIN HCL 1000 MG IV SOLR
2000.0000 mg | Freq: Once | INTRAVENOUS | Status: AC
  Administered 2011-12-27: 2000 mg via INTRAVENOUS
  Filled 2011-12-27: qty 2000

## 2011-12-27 MED ORDER — HYDROCODONE-ACETAMINOPHEN 5-325 MG PO TABS
1.0000 | ORAL_TABLET | ORAL | Status: DC | PRN
  Administered 2011-12-28 (×2): 2 via ORAL
  Filled 2011-12-27 (×2): qty 2

## 2011-12-27 MED ORDER — ROPINIROLE HCL 1 MG PO TABS
2.0000 mg | ORAL_TABLET | Freq: Every day | ORAL | Status: DC
  Administered 2011-12-28: 2 mg via ORAL
  Filled 2011-12-27 (×3): qty 2

## 2011-12-27 MED ORDER — EZETIMIBE 10 MG PO TABS
10.0000 mg | ORAL_TABLET | Freq: Every evening | ORAL | Status: DC
Start: 2011-12-27 — End: 2011-12-29
  Administered 2011-12-27 – 2011-12-28 (×2): 10 mg via ORAL
  Filled 2011-12-27 (×3): qty 1

## 2011-12-27 MED ORDER — CLOTRIMAZOLE 1 % EX CREA
TOPICAL_CREAM | CUTANEOUS | Status: DC | PRN
  Filled 2011-12-27: qty 15

## 2011-12-27 MED ORDER — POLYSACCHARIDE IRON COMPLEX 150 MG PO CAPS
150.0000 mg | ORAL_CAPSULE | Freq: Two times a day (BID) | ORAL | Status: DC
  Administered 2011-12-27 – 2011-12-29 (×4): 150 mg via ORAL
  Filled 2011-12-27 (×5): qty 1

## 2011-12-27 MED ORDER — SODIUM CHLORIDE 0.9 % IJ SOLN: 3.0000 mL | Freq: Two times a day (BID) | INTRAMUSCULAR | Status: DC

## 2011-12-27 MED ORDER — TRAMADOL HCL 50 MG PO TABS: 50.0000 mg | ORAL_TABLET | Freq: Four times a day (QID) | ORAL | Status: DC | PRN

## 2011-12-27 MED ORDER — SODIUM CHLORIDE 0.9 % IV SOLN
INTRAVENOUS | Status: DC
  Administered 2011-12-27 – 2011-12-29 (×2): via INTRAVENOUS

## 2011-12-27 MED ORDER — AMLODIPINE BESYLATE 5 MG PO TABS
5.0000 mg | ORAL_TABLET | Freq: Every evening | ORAL | Status: DC
Start: 2011-12-27 — End: 2011-12-29
  Administered 2011-12-27 – 2011-12-28 (×2): 5 mg via ORAL
  Filled 2011-12-27 (×3): qty 1

## 2011-12-27 MED ORDER — TRIAMCINOLONE ACETONIDE 0.1 % EX CREA
TOPICAL_CREAM | Freq: Two times a day (BID) | CUTANEOUS | Status: DC
  Administered 2011-12-27 – 2011-12-28 (×3): via TOPICAL
  Filled 2011-12-27: qty 15

## 2011-12-27 MED ORDER — ACETAMINOPHEN 325 MG PO TABS: 650.0000 mg | ORAL_TABLET | Freq: Four times a day (QID) | ORAL | Status: DC | PRN

## 2011-12-27 MED ORDER — RIVAROXABAN 10 MG PO TABS
10.0000 mg | ORAL_TABLET | Freq: Every day | ORAL | Status: DC
  Administered 2011-12-28 – 2011-12-29 (×2): 10 mg via ORAL
  Filled 2011-12-27 (×3): qty 1

## 2011-12-27 MED ORDER — ACETAMINOPHEN 650 MG RE SUPP: 650.0000 mg | Freq: Four times a day (QID) | RECTAL | Status: DC | PRN

## 2011-12-27 MED ORDER — VANCOMYCIN HCL 1000 MG IV SOLR
1500.0000 mg | INTRAVENOUS | Status: DC
  Administered 2011-12-28: 1500 mg via INTRAVENOUS
  Filled 2011-12-27: qty 1500

## 2011-12-27 MED ORDER — ATORVASTATIN CALCIUM 20 MG PO TABS
20.0000 mg | ORAL_TABLET | Freq: Every evening | ORAL | Status: DC
  Administered 2011-12-27 – 2011-12-28 (×2): 20 mg via ORAL
  Filled 2011-12-27 (×3): qty 1

## 2011-12-27 MED ORDER — METHOCARBAMOL 500 MG PO TABS
500.0000 mg | ORAL_TABLET | Freq: Four times a day (QID) | ORAL | Status: DC | PRN
Start: 2011-12-27 — End: 2011-12-29
  Administered 2011-12-28: 500 mg via ORAL
  Filled 2011-12-27 (×2): qty 1

## 2011-12-27 MED ORDER — OXYBUTYNIN CHLORIDE 5 MG PO TABS
5.0000 mg | ORAL_TABLET | Freq: Two times a day (BID) | ORAL | Status: DC
  Administered 2011-12-27 – 2011-12-29 (×4): 5 mg via ORAL
  Filled 2011-12-27 (×5): qty 1

## 2011-12-27 MED ORDER — HYDROCODONE-ACETAMINOPHEN 5-325 MG PO TABS
1.0000 | ORAL_TABLET | ORAL | Status: DC | PRN
  Administered 2011-12-27: 2 via ORAL
  Filled 2011-12-27: qty 2

## 2011-12-27 MED ORDER — ZOLPIDEM TARTRATE 5 MG PO TABS
5.0000 mg | ORAL_TABLET | Freq: Every day | ORAL | Status: DC
  Administered 2011-12-27 – 2011-12-28 (×2): 5 mg via ORAL
  Filled 2011-12-27 (×2): qty 1

## 2011-12-27 MED ORDER — HYDROMORPHONE HCL PF 1 MG/ML IJ SOLN: 1.0000 mg | INTRAMUSCULAR | Status: DC | PRN

## 2011-12-27 MED ORDER — PREDNISONE 5 MG PO TABS
5.0000 mg | ORAL_TABLET | Freq: Every day | ORAL | Status: DC
  Administered 2011-12-28 – 2011-12-29 (×2): 5 mg via ORAL
  Filled 2011-12-27 (×3): qty 1

## 2011-12-27 NOTE — H&P (Signed)
Christopher Baker is an 78 y.o. male.   Chief Complaint: left knee pain HPI: [name] presented to the office with increased pain, swelling, and redness in the left knee and lower extremity. He reports that he had some redness on the medial portion of the incision this past weekend but home health noticed today that the redness is extended over the entire anterior portion of the knee as well down to the anterior tibia. He has not felt well the last couple days, feeling very fatigued. Temperature taken in the office 100.1 F. He denies drainage from the incision. He has a history of cellulitis as well as some difficulties with suspected infections in the right total knee. Left knee aspirated in the office with fluid sent for gram stain.   Past Medical History  Diagnosis Date  . Hyperlipidemia   . HTN (hypertension)   . Status post placement of cardiac pacemaker DDD-  06-04-10-- LAST CHECK 09-08-10 IN EPIC    SECONDARY TO SYNCOPY AND BRADYCARDIA  . Restless leg syndrome   . Normal nuclear stress test 2008    LOW RISK--   DR  WALL  . Carotid stenosis, bilateral PER DR EARLY / DUPLEX  09-09-10      40 - 59%   BILATERALLY--- ASYMPTOMATIC  . History of echocardiogram 11-21-2006    EF 65%  . History of prostate cancer S/P RADIATION  [place]  6 YRS AGO  . Psoriasis   . Arthritis with psoriasis   . GERD (gastroesophageal reflux disease)     CONTROLLED W/ PROTONIX  . Borderline diabetes     DIET CONTROLLED - PT STATES HE IS NOT DIABETIC  . Lumbar spondylosis W/ RADICULOPATHY  . Iron deficiency   . Cataract immature LEFT EYE  . PAF (paroxysmal atrial fibrillation)   . SVT (supraventricular tachycardia)     S/P ABLATION   2008  . Coronary artery disease CARDIOLOGIST- DR Sharrell Ku    09-08-10 VISIT AND PACEMAKER CHECK  IN EPIC  . Cancer     HX OF PROSTATE CANCER--TX'D WITH RADIATION  . Sleep apnea TESTED YRS AGO-- NO CPAP RX GIVEN    PT STATES HE DOES NOT THINK HE STILL HAS SLEEP APNEA--DOES NOT USE  CPAP    Past Surgical History  Procedure Date  . Replacement total knee 1997    RIGHT  . Cardiac pacemaker placement 06-04-10    DDD/ AND REMOVAL LOOR RECORDER  . Loop recorder placement 01-30-10  . Lumbar laminectomy/ diskectomy/ fusion 05-19-10    L4 - 5  . Cholecystectomy 2009  . Cardiac electrophysiology study and ablation 2008    FOR SVT  . Prostate biopsy 2007  . Transurethral resection of prostate 2006  . Inguinal hernia repair 2005    LEFT/   DONE WITH PENILE PROSTESIS SURG.  . Removal and placement penile prosthesis implant  2005    AND LEFT CORPOROPLASTY /    DONE INGUINAL REPAIR  . Penile prosthesis implant 1995  . Knee arthroscopy 06/02/2011    Procedure: ARTHROSCOPY KNEE;  Surgeon: Loanne Drilling;  Location: [facility];  Service: Orthopedics;  Laterality: Left;  LEFT KNEE ARTHROSCOPY WITH DEBRIDEMENT  . Total knee arthroplasty 12/20/2011    Procedure: TOTAL KNEE ARTHROPLASTY;  Surgeon: Loanne Drilling, MD;  Location: [facility];  Service: Orthopedics;  Laterality: Left;    Family History  Problem Relation Age of Onset  . Stroke Mother   . Early death Father    Social  History:  reports that he has been smoking Cigarettes.  He has a 5.5 pack-year smoking history. He has never used smokeless tobacco. He reports that he drinks alcohol. He reports that he does not use illicit drugs.  Allergies:  Allergies  Allergen Reactions  . Ciprofloxacin Nausea Only    REACTION: GI upset  . Other     SCOLLOPS--SUDDEN GI ATTACK    Medications Prior to Admission  Medication Sig Dispense Refill  . amLODipine (NORVASC) 5 MG tablet Take 5 mg by mouth every evening.      Marland Kitchen atorvastatin (LIPITOR) 20 MG tablet Take 20 mg by mouth every evening.       . clotrimazole-betamethasone (LOTRISONE) cream Apply topically 2 (two) times daily.  30 g  0  . estazolam (PROSOM) 2 MG tablet Take 1 tablet (2 mg total) by mouth at bedtime.  30 tablet  5  . ezetimibe (ZETIA) 10 MG tablet  Take 10 mg by mouth every evening.      . famotidine (PEPCID) 20 MG tablet Take 20 mg by mouth as needed. For indigestion      . HYDROcodone-acetaminophen (NORCO) 5-325 MG per tablet Take 1-2 tablets by mouth every 4 (four) hours as needed (severe pain).  80 tablet  0  . iron polysaccharides (FERREX 150) 150 MG capsule Take 1 capsule (150 mg total) by mouth 2 (two) times daily.  60 capsule  11  . methocarbamol (ROBAXIN) 500 MG tablet Take 1 tablet (500 mg total) by mouth every 6 (six) hours as needed.  80 tablet  0  . OXYBUTYNIN CHLORIDE PO Take 5 mg by mouth 2 (two) times daily.       . pantoprazole (PROTONIX) 40 MG tablet TAKE ONE TABLET BY MOUTH TWICE DAILY  60 tablet  6  . predniSONE (DELTASONE) 5 MG tablet Take 1 tablet (5 mg total) by mouth daily. Take one 5 mg tablet twice a day on Thursday 12/23/2011, and then resume 5 mg daily starting on 12/24/2011  30 tablet  2  . rivaroxaban (XARELTO) 10 MG TABS tablet Take 1 tablet (10 mg total) by mouth daily with breakfast.  18 tablet  0  . rOPINIRole (REQUIP) 2 MG tablet TAKE ONE TABLET BY MOUTH DAILY  30 tablet  0  . traMADol (ULTRAM) 50 MG tablet Take 1-2 tablets (50-100 mg total) by mouth every 6 (six) hours as needed (mild pain).  80 tablet  0  . triamcinolone cream (KENALOG) 0.1 % APPLY TO AFFECTED AREAS AS DIRECTED  480 g  3     Review of Systems  Constitutional: Positive for fever and malaise/fatigue. Negative for chills, weight loss and diaphoresis.  HENT: Negative.  Negative for neck pain.   Eyes: Negative.   Respiratory: Negative.   Cardiovascular: Negative.   Gastrointestinal: Negative.   Genitourinary: Negative.   Musculoskeletal: Positive for joint pain. Negative for myalgias, back pain and falls.  Skin: Negative for itching and rash.  Neurological: Positive for weakness. Negative for dizziness, tingling, tremors and sensory change.  Endo/Heme/Allergies: Negative.   Psychiatric/Behavioral: Negative.    Physical Exam    Constitutional: He is oriented to person, place, and time. He appears well-developed and well-nourished. No distress.  HENT:  Head: Normocephalic and atraumatic.  Right Ear: External ear normal.  Left Ear: External ear normal.  Nose: Nose normal.  Mouth/Throat: Oropharynx is clear and moist.  Eyes: Conjunctivae and EOM are normal.  Neck: Normal range of motion. Neck supple. No tracheal deviation present.  Cardiovascular: Normal rate, regular rhythm, normal heart sounds and intact distal pulses.   No murmur heard. Respiratory: Effort normal and breath sounds normal. No respiratory distress.  GI: Soft. Bowel sounds are normal. He exhibits no distension and no mass. There is no tenderness.  Musculoskeletal:       Right knee: Normal.       Left knee: He exhibits decreased range of motion, swelling, effusion and erythema. tenderness found.       Right ankle: Normal.       Left ankle: He exhibits decreased range of motion and swelling. no tenderness.       Legs: Neurological: He is alert and oriented to person, place, and time.  Skin: Skin is warm. He is not diaphoretic. There is erythema.     Psychiatric: He has a normal mood and affect. His behavior is normal. Judgment and thought content normal.     Assessment/Plan Admission to hospital for IV vancomycin for suspected infection of left total knee. Will get labs as well.   Taressa Rauh LAUREN 12/27/2011, 7:48 PM

## 2011-12-27 NOTE — Progress Notes (Addendum)
ANTIBIOTIC CONSULT NOTE - INITIAL  Pharmacy Consult for Vancomycin Indication: L knee cellulitis s/p L TKA  Allergies  Allergen Reactions  . Ciprofloxacin Nausea Only    REACTION: GI upset  . Other     SCOLLOPS--SUDDEN GI ATTACK    Patient Measurements:    Vital Signs:   Intake/Output from previous day:   Intake/Output from this shift:    Labs: No results found for this basename: WBC:3,HGB:3,PLT:3,LABCREA:3,CREATININE:3 in the last 72 hours The CrCl is unknown because both a height and weight (above a minimum accepted value) are required for this calculation. No results found for this basename: VANCOTROUGH:2,VANCOPEAK:2,VANCORANDOM:2,GENTTROUGH:2,GENTPEAK:2,GENTRANDOM:2,TOBRATROUGH:2,TOBRAPEAK:2,TOBRARND:2,AMIKACINPEAK:2,AMIKACINTROU:2,AMIKACIN:2, in the last 72 hours   Medical History: Past Medical History  Diagnosis Date  . Hyperlipidemia   . HTN (hypertension)   . Status post placement of cardiac pacemaker DDD-  06-04-10-- LAST CHECK 09-08-10 IN EPIC    SECONDARY TO SYNCOPY AND BRADYCARDIA  . Restless leg syndrome   . Normal nuclear stress test 2008    LOW RISK--   DR  WALL  . Carotid stenosis, bilateral PER DR EARLY / DUPLEX  09-09-10      40 - 59%   BILATERALLY--- ASYMPTOMATIC  . History of echocardiogram 11-21-2006    EF 65%  . History of prostate cancer S/P RADIATION  [place]  6 YRS AGO  . Psoriasis   . Arthritis with psoriasis   . GERD (gastroesophageal reflux disease)     CONTROLLED W/ PROTONIX  . Borderline diabetes     DIET CONTROLLED - PT STATES HE IS NOT DIABETIC  . Lumbar spondylosis W/ RADICULOPATHY  . Iron deficiency   . Cataract immature LEFT EYE  . PAF (paroxysmal atrial fibrillation)   . SVT (supraventricular tachycardia)     S/P ABLATION   2008  . Coronary artery disease CARDIOLOGIST- DR Sharrell Ku    09-08-10 VISIT AND PACEMAKER CHECK  IN EPIC  . Cancer     HX OF PROSTATE CANCER--TX'D WITH RADIATION  . Sleep apnea TESTED YRS AGO-- NO CPAP RX  GIVEN    PT STATES HE DOES NOT THINK HE STILL HAS SLEEP APNEA--DOES NOT USE CPAP   Assessment:  34 YOM s/p L TKA on 7/1 discharged on 7/4 and returned today with L knee cellulitis to start IV vancomycin.    Wt 106.6 last recorded 12/20/11, Scr 1.17 last on 12/22/11. CrCl 80 ml/min, normalized 54 ml/min  No cultures obtained this admission, no other labs  MRSA PCR negative on 12/16/2011  Will aim for higher vancomycin trough goal for now given nature of infection, pharmacy will f/u and adjust as needed  No gram negative coverage on board  Goal of Therapy:  Vancomycin trough level 15-20 mcg/ml  Plan:   Vancomycin 2000 mg IV x 1 as loading dose  Pharmacy will f/u [facility] for renal fxn and order further doses  Given nosocomial nature of cellulitis, consider adding gram negative coverage if patient dose not improve on Vancomycin alone.  Christopher Baker Thi 12/27/2011,6:56 PM   Addendum:  CM[facility] returns with Scr 1.3, CG CrCl = 73 ml/min, normalized = 49 ml/min  Plan:  Continue with Vancomycin 1500 mg IV q24h.  Next dose due 7/9 at 2000.  Christopher Baker, PharmD, BCPS Pager: [phone] 9:02 PM

## 2011-12-28 ENCOUNTER — Encounter (HOSPITAL_COMMUNITY): Payer: Self-pay | Admitting: *Deleted

## 2011-12-28 ENCOUNTER — Other Ambulatory Visit: Payer: Self-pay | Admitting: Internal Medicine

## 2011-12-28 LAB — URINALYSIS, ROUTINE W REFLEX MICROSCOPIC
Bilirubin Urine: NEGATIVE
Leukocytes, UA: NEGATIVE
Nitrite: NEGATIVE
Specific Gravity, Urine: 1.016 (ref 1.005–1.030)
Urobilinogen, UA: 1 mg/dL (ref 0.0–1.0)
pH: 7 (ref 5.0–8.0)

## 2011-12-28 LAB — CBC
MCV: 101 fL — ABNORMAL HIGH (ref 78.0–100.0)
Platelets: 308 10*3/uL (ref 150–400)
RBC: 3.02 MIL/uL — ABNORMAL LOW (ref 4.22–5.81)
RDW: 17.1 % — ABNORMAL HIGH (ref 11.5–15.5)
WBC: 8.4 10*3/uL (ref 4.0–10.5)

## 2011-12-28 LAB — URINE MICROSCOPIC-ADD ON

## 2011-12-28 NOTE — Discharge Summary (Signed)
Physician Discharge Summary   Patient ID: Christopher Baker MRN: [id number] DOB/AGE: 76-Oct-1935 76 y.o.  Admit date: 12/20/2011 Discharge date: 12/23/2011  Primary Diagnosis: Osteoarthritis Left Knee   Admission Diagnoses:  Past Medical History  Diagnosis Date  . Hyperlipidemia   . HTN (hypertension)   . Status post placement of cardiac pacemaker DDD-  06-04-10-- LAST CHECK 09-08-10 IN EPIC    SECONDARY TO SYNCOPY AND BRADYCARDIA  . Restless leg syndrome   . Normal nuclear stress test 2008    LOW RISK--   DR  WALL  . Carotid stenosis, bilateral PER DR EARLY / DUPLEX  09-09-10      40 - 59%   BILATERALLY--- ASYMPTOMATIC  . History of echocardiogram 11-21-2006    EF 65%  . History of prostate cancer S/P RADIATION  TX  6 YRS AGO  . Psoriasis   . Arthritis with psoriasis   . GERD (gastroesophageal reflux disease)     CONTROLLED W/ PROTONIX  . Borderline diabetes     DIET CONTROLLED - PT STATES HE IS NOT DIABETIC  . Lumbar spondylosis W/ RADICULOPATHY  . Iron deficiency   . Cataract immature LEFT EYE  . PAF (paroxysmal atrial fibrillation)   . SVT (supraventricular tachycardia)     S/P ABLATION   2008  . Coronary artery disease CARDIOLOGIST- DR Sharrell Ku    09-08-10 VISIT AND PACEMAKER CHECK  IN EPIC  . Cancer     HX OF PROSTATE CANCER--TX'D WITH RADIATION  . Sleep apnea TESTED YRS AGO-- NO CPAP RX GIVEN    PT STATES HE DOES NOT THINK HE STILL HAS SLEEP APNEA--DOES NOT USE CPAP   Discharge Diagnoses:   Principal Problem:  *OA (osteoarthritis) of knee  Procedure:  Procedure(s) (LRB): TOTAL KNEE ARTHROPLASTY (Left)   Consults: None  HPI: Christopher Baker is a 78 y.o. year old male with end stage OA of his left knee with progressively worsening pain and dysfunction. He has constant pain, with activity and at rest and significant functional deficits with difficulties even with ADLs. He has had extensive non-op management including analgesics, injections of cortisone and  viscosupplements, and home exercise program, but remains in significant pain with significant dysfunction. Radiographs show bone on bone arthritis medial and patellofemoral with tibial subchondral sclerosis. He presents now for left Total Knee Arthroplasty.   Laboratory Data: Hospital Outpatient Visit on 12/16/2011  Component Date Value Range Status  . MRSA, PCR 12/16/2011 NEGATIVE  NEGATIVE Final  . Staphylococcus aureus 12/16/2011 NEGATIVE  NEGATIVE Final   Comment:                                 The Xpert SA Assay (FDA                          approved for NASAL specimens                          only), is one component of                          a comprehensive surveillance                          program.  It is not intended  to diagnose infection nor to                          guide or monitor treatment.  Marland Kitchen aPTT 12/16/2011 27  24 - 37 seconds Final  . WBC 12/16/2011 6.7  4.0 - 10.5 K/uL Final  . RBC 12/16/2011 4.11* 4.22 - 5.81 MIL/uL Final  . Hemoglobin 12/16/2011 13.5  13.0 - 17.0 g/dL Final  . HCT 09/81/1914 40.6  39.0 - 52.0 % Final  . MCV 12/16/2011 98.8  78.0 - 100.0 fL Final  . MCH 12/16/2011 32.8  26.0 - 34.0 pg Final  . MCHC 12/16/2011 33.3  30.0 - 36.0 g/dL Final  . RDW 78/29/5621 17.2* 11.5 - 15.5 % Final  . Platelets 12/16/2011 219  150 - 400 K/uL Final  . Sodium 12/16/2011 142  135 - 145 mEq/L Final  . Potassium 12/16/2011 4.5  3.5 - 5.1 mEq/L Final  . Chloride 12/16/2011 107  96 - 112 mEq/L Final  . CO2 12/16/2011 28  19 - 32 mEq/L Final  . Glucose, Bld 12/16/2011 95  70 - 99 mg/dL Final  . BUN 30/86/5784 16  6 - 23 mg/dL Final  . Creatinine, Ser 12/16/2011 1.05  0.50 - 1.35 mg/dL Final  . Calcium 69/62/9528 9.5  8.4 - 10.5 mg/dL Final  . Total Protein 12/16/2011 6.4  6.0 - 8.3 g/dL Final  . Albumin 41/32/4401 3.7  3.5 - 5.2 g/dL Final  . AST 02/72/5366 20  0 - 37 U/L Final  . ALT 12/16/2011 21  0 - 53 U/L Final  . Alkaline  Phosphatase 12/16/2011 106  39 - 117 U/L Final  . Total Bilirubin 12/16/2011 0.3  0.3 - 1.2 mg/dL Final  . GFR calc non Af Amer 12/16/2011 67* >90 mL/min Final  . GFR calc Af Amer 12/16/2011 78* >90 mL/min Final   Comment:                                 The eGFR has been calculated                          using the CKD EPI equation.                          This calculation has not been                          validated in all clinical                          situations.                          eGFR's persistently                          <90 mL/min signify                          possible Chronic Kidney Disease.  Marland Kitchen Prothrombin Time 12/16/2011 13.3  11.6 - 15.2 seconds Final  . INR 12/16/2011 0.99  0.00 - 1.49 Final  . Color, Urine 12/16/2011 YELLOW  YELLOW Final  . APPearance 12/16/2011 CLEAR  CLEAR Final  . Specific Gravity, Urine 12/16/2011 1.020  1.005 - 1.030 Final  . pH 12/16/2011 6.0  5.0 - 8.0 Final  . Glucose, UA 12/16/2011 NEGATIVE  NEGATIVE mg/dL Final  . Hgb urine dipstick 12/16/2011 TRACE* NEGATIVE Final  . Bilirubin Urine 12/16/2011 NEGATIVE  NEGATIVE Final  . Ketones, ur 12/16/2011 NEGATIVE  NEGATIVE mg/dL Final  . Protein, ur 16/03/9603 NEGATIVE  NEGATIVE mg/dL Final  . Urobilinogen, UA 12/16/2011 0.2  0.0 - 1.0 mg/dL Final  . Nitrite 54/02/8118 NEGATIVE  NEGATIVE Final  . Leukocytes, UA 12/16/2011 NEGATIVE  NEGATIVE Final  . Squamous Epithelial / LPF 12/16/2011 RARE  RARE Final  . WBC, UA 12/16/2011 0-2  <3 WBC/hpf Final  . RBC / HPF 12/16/2011 0-2  <3 RBC/hpf Final  . Bacteria, UA 12/16/2011 RARE  RARE Final  . Daryll Drown 12/16/2011 MUCOUS PRESENT   Final   URINALYSIS       12/16/11      12/21/11   Color, Urine    YELLOW        AMBER     APPearance    CLEAR        CLOUDY     Specific Gravity, Urine    1.020        1.024     pH    6.0        6.5     Glucose, UA    NEGATIVE        NEGATIVE     Bilirubin Urine    NEGATIVE        NEGATIVE     Ketones, ur     NEGATIVE        NEGATIVE     Protein, ur    NEGATIVE        300 mg/dL">300     Urobilinogen, UA    0.2        1.0     Nitrite    NEGATIVE        NEGATIVE     Leukocytes, UA    NEGATIVE        SMALL     Hgb urine dipstick    TRACE        LARGE     Urine-Other    MUCOUS PRESENT        FIELD OBSCURED BY RBC'S     WBC, UA    0-2        21-50     RBC / HPF    0-2        TOO NUMEROUS TO COUNT     Squamous Epithelial / LPF    RARE            Bacteria, UA    RARE              Basename 12/28/11 0811 12/27/11 2010  HGB 10.2* 11.6*    Basename 12/28/11 0811 12/27/11 2010  WBC 8.4 13.2*  RBC 3.02* 3.47*  HCT 30.5* 35.2*  PLT 308 428*    Basename 12/27/11 2010  NA 136  K 4.3  CL 101  CO2 24  BUN 24*  CREATININE 1.30  GLUCOSE 120*  CALCIUM 9.7    Basename 12/27/11 2010  LABPT --  INR 1.38    X-Rays:Dg Chest 2 View  12/16/2011  *RADIOLOGY REPORT*  Clinical Data: Operative evaluation for left total knee arthroplasty.  Atrial fibrillation.  Diabetes.  Smoker  CHEST - 2 VIEW  Comparison: 09/10/1998  Findings: A  dual lead pacer is in place via a left subclavian approach with lead tips stable overlying the right atrial and right ventricular contours.  Heart and mediastinal contours are within normal limits and stable.  The lung fields appear clear with no signs of focal infiltrate or congestive failure.  No pleural fluid or significant peribronchial cuffing is noted.  Bony structures are notable for degenerative changes of both humeral heads and osteophytosis of the mid and lower thoracic spine.  IMPRESSION: Stable cardiopulmonary appearance with no new focal or acute abnormality identified.  Original Report Authenticated By: Bertha Stakes, M.D.    EKG: Orders placed during the hospital encounter of 12/20/11  . EKG     Hospital Course: Patient was admitted to [facility] and taken to the OR and underwent the above state procedure without complications.  Patient tolerated  the procedure well and was later transferred to the recovery room and then to the orthopaedic floor for postoperative care.  They were given PO and IV analgesics for pain control following their surgery.  They were given 24 hours of postoperative antibiotics and started on DVT prophylaxis in the form of Xarelto.   PT and OT were ordered for total joint protocol.  Discharge planning consulted to help with postop disposition and equipment needs.  Patient had a good night on the evening of surgery and started to get up OOB with therapy on day one.  PCA Morphine was discontinued and they were weaned over to PO meds.  Hemovac drain was pulled without difficulty.  Patient had a rough night the next night due to pain. Wife was in the room. He had some intermittent confusion likely due to the narcotics. Also had blood in the foley. Removed the foley and he was voiding on his own. UA was ordered that night. Continued weight-Bearing as tolerated to left leg.  Monitored urine and made sure the blood cleared up.  Reduced the narcotics. D/C Oxy and use Ultram with Norco as a backup.  Continued to work with therapy into day two.  Dressing was changed on day two and the incision was healing well.  By day three, the patient had progressed with therapy and meeting their goals. Voiding without difficulty. Positive flatus.  Incision was healing well.  Patient was seen in rounds and was ready to go home.  Discharge Medications: Prior to Admission medications   Medication Sig Start Date End Date Taking? Authorizing Provider  amLODipine (NORVASC) 5 MG tablet Take 5 mg by mouth every evening.   Yes Historical Provider, MD  ezetimibe (ZETIA) 10 MG tablet Take 10 mg by mouth every evening.   Yes Historical Provider, MD  famotidine (PEPCID) 20 MG tablet Take 20 mg by mouth as needed. For indigestion   Yes Historical Provider, MD  OXYBUTYNIN CHLORIDE PO Take 5 mg by mouth 2 (two) times daily.    Yes Historical Provider, MD    atorvastatin (LIPITOR) 20 MG tablet Take 20 mg by mouth every evening.    Historical Provider, MD  clotrimazole-betamethasone (LOTRISONE) cream Apply 1 application topically 2 (two) times daily. For psoriasis.    Historical Provider, MD  diclofenac sodium (VOLTAREN) 1 % GEL Apply 1 application topically 2 (two) times daily as needed. Arthritis in both thumbs.    Historical Provider, MD  estazolam (PROSOM) 2 MG tablet Take 2 mg by mouth at bedtime.    Historical Provider, MD  HYDROcodone-acetaminophen (NORCO) 5-325 MG per tablet Take 1-2 tablets by mouth every 4 (  four) hours as needed. (severe) pain.    Historical Provider, MD  iron polysaccharides (NIFEREX) 150 MG capsule Take 150 mg by mouth 2 (two) times daily.    Historical Provider, MD  methocarbamol (ROBAXIN) 500 MG tablet Take 500 mg by mouth every 6 (six) hours as needed. Muscle spasms.    Historical Provider, MD  pantoprazole (PROTONIX) 40 MG tablet Take 40 mg by mouth 2 (two) times daily.    Historical Provider, MD  predniSONE (DELTASONE) 5 MG tablet Take 5 mg by mouth daily.    Historical Provider, MD  rivaroxaban (XARELTO) 10 MG TABS tablet Take 10 mg by mouth daily.    Historical Provider, MD  rOPINIRole (REQUIP) 2 MG tablet Take 2 mg by mouth every evening.    Historical Provider, MD  traMADol (ULTRAM) 50 MG tablet Take 50-100 mg by mouth every 6 (six) hours as needed. For mild pain.    Historical Provider, MD  triamcinolone cream (KENALOG) 0.1 % Apply 1 application topically See admin instructions. To affected areas for psoriasis as needed as directed.    Historical Provider, MD    Diet: Cardiac diet Activity:WBAT Follow-up:in 2 weeks Disposition - Home Discharged Condition: good   Discharge Orders    Future Appointments: Provider: Department: Dept Phone: Center:   09/21/2012 2:00 PM [facility] [phone] [facility]   09/21/2012 3:00 PM Evern Bio, NP [facility] [phone] [facility]     Future Orders Please  Complete By Expires   Diet - low sodium heart healthy      Call MD / Call 911      Comments:   If you experience chest pain or shortness of breath, CALL 911 and be transported to the hospital emergency room.  If you develope a fever above 101 F, pus (white drainage) or increased drainage or redness at the wound, or calf pain, call your surgeon's office.   Discharge instructions      Comments:   Pick up stool softner and laxative for home. Do not submerge incision under water. May shower. Continue to use ice for pain and swelling from surgery.  Take Xarelto for two and a half more weeks, then discontinue Xarelto.   Constipation Prevention      Comments:   Drink plenty of fluids.  Prune juice may be helpful.  You may use a stool softener, such as Colace (over the counter) 100 mg twice a day.  Use MiraLax (over the counter) for constipation as needed.   Increase activity slowly as tolerated      Patient may shower      Comments:   You may shower without a dressing once there is no drainage.  Do not wash over the wound.  If drainage remains, do not shower until drainage stops.   Driving restrictions      Comments:   No driving until released by the physician.   Lifting restrictions      Comments:   No lifting until released by the physician.   TED hose      Comments:   Use stockings (TED hose) for 3 weeks on both leg(s).  You may remove them at night for sleeping.   Change dressing      Comments:   Change dressing daily with sterile 4 x 4 inch gauze dressing and apply TED hose. Do not submerge the incision under water.   Do not put a pillow under the knee. Place it under the heel.  Do not sit on low chairs, stoools or toilet seats, as it may be difficult to get up from low surfaces        Medication List  As of 12/28/2011  9:29 AM   STOP taking these medications         methotrexate 2.5 MG tablet      nabumetone 750 MG tablet      predniSONE 5 MG tablet      vitamin C 500  MG tablet      Vitamin D (Ergocalciferol) 50000 UNITS Caps      zolpidem 5 MG tablet         TAKE these medications         amLODipine 5 MG tablet   Commonly known as: NORVASC   Take 5 mg by mouth every evening.      ezetimibe 10 MG tablet   Commonly known as: ZETIA   Take 10 mg by mouth every evening.      famotidine 20 MG tablet   Commonly known as: PEPCID   Take 20 mg by mouth as needed. For indigestion      OXYBUTYNIN CHLORIDE PO   Take 5 mg by mouth 2 (two) times daily.           Follow-up Information    Follow up with Loanne Drilling, MD. Schedule an appointment as soon as possible for a visit in 2 weeks.   Contact information:   [facility] [address], [address] [place] [address] [phone]          Signed: Patrica Duel 12/28/2011, 9:29 AM

## 2011-12-28 NOTE — Evaluation (Signed)
Physical Therapy Evaluation Patient Details Name: Christopher Baker MRN: [id number] DOB: Nov 19, 1933 Today's Date: 12/28/2011 Time: 4782-9562 PT Time Calculation (min): 20 min  PT Assessment / Plan / Recommendation Clinical Impression  Pt presents with recent infection in L knee following L TKA on 12/20/11.  Tolerated ambulation in hallway well with RW at supervision level for safety with min cues.  Pts will not need further PT in acute venue, all needs can be met with HHPT following D/C.      PT Assessment  All further PT needs can be met in the next venue of care    Follow Up Recommendations  Home health PT    Barriers to Discharge        Equipment Recommendations  None recommended by PT    Recommendations for Other Services     Frequency      Precautions / Restrictions Precautions Precautions: Knee Precaution Comments: Did not use KI Restrictions Weight Bearing Restrictions: No   Pertinent Vitals/Pain 3/10      Mobility  Bed Mobility Bed Mobility: Supine to Sit;Sitting - Scoot to Edge of Bed Supine to Sit: 6: Modified independent (Device/Increase time) Sitting - Scoot to Edge of Bed: 6: Modified independent (Device/Increase time) Transfers Transfers: Sit to Stand;Stand to Sit Sit to Stand: 5: Supervision;From elevated surface;From bed Stand to Sit: 5: Supervision;With upper extremity assist;With armrests;To chair/3-in-1 Details for Transfer Assistance: Supervision for safety with min cues for hand placement.  Ambulation/Gait Ambulation/Gait Assistance: 5: Supervision Ambulation Distance (Feet): 150 Feet Assistive device: Rolling walker Ambulation/Gait Assistance Details: Min cues for upright posture.  Gait Pattern: Step-to pattern Gait velocity: decreased Stairs: No Wheelchair Mobility Wheelchair Mobility: No    Exercises     PT Diagnosis: Difficulty walking;Acute pain  PT Problem List: Decreased strength;Decreased range of motion;Decreased mobility PT Treatment  Interventions:     PT Goals    Visit Information  Last PT Received On: 12/28/11 Assistance Needed: +1    Subjective Data  Subjective: Did they tell you I injured my knee? Patient Stated Goal: to get home.    Prior Functioning  Home Living Lives With: Spouse Available Help at Discharge: Family Type of Home: House Home Access: Stairs to enter Secretary/administrator of Steps: 4 Entrance Stairs-Rails: Right Home Layout: One level Bathroom Shower/Tub: Health visitor: Standard Home Adaptive Equipment: Bedside commode/3-in-1;Walker - rolling Prior Function Level of Independence: Independent with assistive device(s) Able to Take Stairs?: Yes Driving: No Communication Communication: No difficulties Dominant Hand: Right    Cognition  Overall Cognitive Status: Appears within functional limits for tasks assessed/performed Arousal/Alertness: Awake/alert Orientation Level: Appears intact for tasks assessed Behavior During Session: [facility] for tasks performed    Extremity/Trunk Assessment Right Lower Extremity Assessment RLE ROM/Strength/Tone: WFL for tasks assessed RLE Coordination: WFL - gross motor Left Lower Extremity Assessment LLE ROM/Strength/Tone: WFL for tasks assessed LLE Coordination: WFL - gross motor Trunk Assessment Trunk Assessment: Normal   Balance    End of Session PT - End of Session Activity Tolerance: Patient tolerated treatment well Patient left: in chair;with call bell/phone within reach Nurse Communication: Mobility status  GP     Page, Meribeth Mattes 12/28/2011, 4:13 PM

## 2011-12-28 NOTE — Progress Notes (Addendum)
Subjective: Hospital Day 2 Christopher Baker is doing better today.  Seen in rounds with Dr. Lequita Halt.  He tells Dr. Lequita Halt that the redness started on Saturday.  He was admitted yesterday for cellulitis in the left leg, his recent surgical leg.  Patient states that he is doing OK this morning.  Temp has only been low grade.  White count was 13.2 on admission.  Will recheck.  Will keep today and plan to let home tomorrow.  Continue to monitor the cellulitis and swelling.  Will allow him to start working with therapy.  Objective: Vital signs in last 24 hours: Temp:  [98.3 F (36.8 C)-99.1 F (37.3 C)] 98.7 F (37.1 C) (07/09 0550) Pulse Rate:  [70-96] 70  (07/09 0550) Resp:  [16] 16  (07/09 0550) BP: (127-147)/(70-78) 132/70 mmHg (07/09 0550) SpO2:  [94 %-96 %] 96 % (07/09 0550) Weight:  [108.863 kg (240 lb)] 108.863 kg (240 lb) (07/08 2150)  Intake/Output from previous day: 07/08 0701 - 07/09 0700 In: 983.3 [P.O.:240; I.V.:243.3; IV Piggyback:500] Out: 800 [Urine:800] Intake/Output this shift: Total I/O In: 360 [P.O.:360] Out: 200 [Urine:200]   Basename 12/27/11 2010  HGB 11.6*    Basename 12/27/11 2010  WBC 13.2*  RBC 3.47*  HCT 35.2*  PLT 428*    Basename 12/27/11 2010  NA 136  K 4.3  CL 101  CO2 24  BUN 24*  CREATININE 1.30  GLUCOSE 120*  CALCIUM 9.7    Basename 12/27/11 2010  LABPT --  INR 1.38   EXAM Neurovascular intact Sensation intact distally Intact pulses distally Dorsiflexion/Plantar flexion intact Incision: no drainage and the redness surrounding the knee incision is better today as per patient and wife. The erythema still present in the leg along with the swelling.  Assessment/Plan Cellulitis Left Leg Recent Left Total Knee Replacement 12/20/2011  No surgical indication at this time. Continue IV antibiotics for now - Vancomycin IV. Keep today and monitor for another 24 hours.  Plan home tomorrow as long as the leg continues to improve. Will  recheck leg and labs in the AM.  Patient had hematuria during the last admission.  Will recheck urine today.  Christopher Baker 12/28/2011, 8:31 AM

## 2011-12-29 ENCOUNTER — Encounter (HOSPITAL_COMMUNITY): Payer: Self-pay | Admitting: Orthopedic Surgery

## 2011-12-29 LAB — CBC
MCH: 33.6 pg (ref 26.0–34.0)
MCHC: 33.7 g/dL (ref 30.0–36.0)
Platelets: 332 10*3/uL (ref 150–400)
RBC: 3.01 MIL/uL — ABNORMAL LOW (ref 4.22–5.81)

## 2011-12-29 MED ORDER — CEPHALEXIN 500 MG PO CAPS
500.0000 mg | ORAL_CAPSULE | Freq: Three times a day (TID) | ORAL | Status: DC
  Administered 2011-12-29: 500 mg via ORAL
  Filled 2011-12-29 (×4): qty 1

## 2011-12-29 MED ORDER — CEPHALEXIN 500 MG PO CAPS: 500.0000 mg | ORAL_CAPSULE | Freq: Three times a day (TID) | ORAL | Status: AC

## 2011-12-29 NOTE — Progress Notes (Signed)
Pt d/c to home per MD order. To follow up with Dr. Lequita Halt Friday. Pt understands medications and follow-up appointments. In no signs of distress. Wheeled out by tech. Wife to drive him home. Scripts and d/c papers in pt's possession.

## 2011-12-29 NOTE — Progress Notes (Signed)
Subjective: Hospital Day 3 Patient states that he had pain yesterday along with swelling.  Overall though, the leg looks better today as compared to yesterday. Patient reports pain as mild.   Patient seen in rounds with Dr. Lequita Halt. Patient is ready to go home today.  Objective: Vital signs in last 24 hours: Temp:  [97.7 F (36.5 C)-99.1 F (37.3 C)] 98.7 F (37.1 C) (07/10 0451) Pulse Rate:  [81-97] 84  (07/10 0451) Resp:  [16] 16  (07/10 0451) BP: (145-154)/(64-76) 154/76 mmHg (07/10 0451) SpO2:  [95 %-98 %] 95 % (07/10 0451)  Intake/Output from previous day:  Intake/Output Summary (Last 24 hours) at 12/29/11 0759 Last data filed at 12/29/11 0455  Gross per 24 hour  Intake 2632.17 ml  Output   2525 ml  Net 107.17 ml    Intake/Output this shift:    Labs:  Basename 12/29/11 0422 12/28/11 0811 12/27/11 2010  HGB 10.1* 10.2* 11.6*    Basename 12/29/11 0422 12/28/11 0811  WBC 11.1* 8.4  RBC 3.01* 3.02*  HCT 30.0* 30.5*  PLT 332 308    Basename 12/27/11 2010  NA 136  K 4.3  CL 101  CO2 24  BUN 24*  CREATININE 1.30  GLUCOSE 120*  CALCIUM 9.7    Basename 12/27/11 2010  LABPT --  INR 1.38    EXAM: General - Patient is Alert, Appropriate and Oriented Extremity - Neurovascular intact Sensation intact distally Incision: no drainage and erythema has improved from yesterday. Compartment soft Incision - clean, dry, no drainage, surrounding erythema has improved. Motor Function - intact, moving foot and toes well on exam.   Assessment/Plan: Cellulitis Left Leg  Recent Left Total Knee Replacement 12/20/2011   Past Medical History  Diagnosis Date  . Hyperlipidemia   . HTN (hypertension)   . Status post placement of cardiac pacemaker DDD-  06-04-10-- LAST CHECK 09-08-10 IN EPIC    SECONDARY TO SYNCOPY AND BRADYCARDIA  . Restless leg syndrome   . Normal nuclear stress test 2008    LOW RISK--   DR  WALL  . Carotid stenosis, bilateral PER DR EARLY /  DUPLEX  09-09-10      40 - 59%   BILATERALLY--- ASYMPTOMATIC  . History of echocardiogram 11-21-2006    EF 65%  . History of prostate cancer S/P RADIATION  TX  6 YRS AGO  . Psoriasis   . Arthritis with psoriasis   . GERD (gastroesophageal reflux disease)     CONTROLLED W/ PROTONIX  . Borderline diabetes     DIET CONTROLLED - PT STATES HE IS NOT DIABETIC  . Lumbar spondylosis W/ RADICULOPATHY  . Iron deficiency   . Cataract immature LEFT EYE  . PAF (paroxysmal atrial fibrillation)   . SVT (supraventricular tachycardia)     S/P ABLATION   2008  . Coronary artery disease CARDIOLOGIST- DR Sharrell Ku    09-08-10 VISIT AND PACEMAKER CHECK  IN EPIC  . Cancer     HX OF PROSTATE CANCER--TX'D WITH RADIATION  . Sleep apnea TESTED YRS AGO-- NO CPAP RX GIVEN    PT STATES HE DOES NOT THINK HE STILL HAS SLEEP APNEA--DOES NOT USE CPAP   Active Problems:  * No active hospital problems. *    Discharge home with home health Diet - Cardiac diet Follow up - in 2 days.  Needs to be seen in the clinic this Friday. Activity - WBAT Disposition - Home Condition Upon Discharge - Stable D/C Meds - See  DC Summary DVT Prophylaxis - Xarelto, continue as before admission.  Kenneth Cuaresma 12/29/2011, 7:59 AM

## 2011-12-29 NOTE — Care Management Note (Signed)
    Page 1 of 1   12/29/2011     2:48:31 PM   CARE MANAGEMENT NOTE 12/29/2011  Patient:  Christopher Baker, Christopher Baker   Account Number:  [id number]  Date Initiated:  12/29/2011  Documentation initiated by:  Colleen Can  Subjective/Objective Assessment:   dx cellulitis /recent total knee replacemnt     Action/Plan:   Pt is from home is is active with [facility]   Anticipated DC Date:  12/29/2011   Anticipated DC Plan:  HOME W HOME HEALTH SERVICES  In-house referral  NA      DC Planning Services  CM consult      [facility]  Resumption Of Svcs/PTA Provider   Choice offered to / List presented to:  C-1 Patient   DME arranged  NA      DME agency  NA     HH arranged  NA      Status of service:  Completed, signed off Medicare Important Message given?   (If response is "NO", the following Medicare IM given date fields will be blank) Date Medicare IM given:   Date Additional Medicare IM given:    Discharge Disposition:  HOME W HOME HEALTH SERVICES  Per UR Regulation:  Reviewed for med. necessity/level of care/duration of stay  If discussed at Long Length of Stay Meetings, dates discussed:    Comments:

## 2011-12-29 NOTE — Discharge Summary (Signed)
Physician Discharge Summary   Patient ID: Christopher Baker MRN: [id number] DOB/AGE: [birth year] 76 y.o.  Admit date: 12/27/2011 Discharge date: 12/29/2011  Primary Diagnosis: Cellulitis Left Leg, Recent Left Total Knee Replacement 12/20/2011  Admission Diagnoses:  Past Medical History  Diagnosis Date  . Hyperlipidemia   . HTN (hypertension)   . Status post placement of cardiac pacemaker DDD-  06-04-10-- LAST CHECK 09-08-10 IN EPIC    SECONDARY TO SYNCOPY AND BRADYCARDIA  . Restless leg syndrome   . Normal nuclear stress test 2008    LOW RISK--   DR  WALL  . Carotid stenosis, bilateral PER DR EARLY / DUPLEX  09-09-10      40 - 59%   BILATERALLY--- ASYMPTOMATIC  . History of echocardiogram 11-21-2006    EF 65%  . History of prostate cancer S/P RADIATION  TX  6 YRS AGO  . Psoriasis   . Arthritis with psoriasis   . GERD (gastroesophageal reflux disease)     CONTROLLED W/ PROTONIX  . Borderline diabetes     DIET CONTROLLED - PT STATES HE IS NOT DIABETIC  . Lumbar spondylosis W/ RADICULOPATHY  . Iron deficiency   . Cataract immature LEFT EYE  . PAF (paroxysmal atrial fibrillation)   . SVT (supraventricular tachycardia)     S/P ABLATION   2008  . Coronary artery disease CARDIOLOGIST- DR Sharrell Ku    09-08-10 VISIT AND PACEMAKER CHECK  IN EPIC  . Cancer     HX OF PROSTATE CANCER--TX'D WITH RADIATION  . Sleep apnea TESTED YRS AGO-- NO CPAP RX GIVEN    PT STATES HE DOES NOT THINK HE STILL HAS SLEEP APNEA--DOES NOT USE CPAP   Discharge Diagnoses:   Active Problems:  * No active hospital problems. *   Procedure:  NONE  Consults: None  HPI: Christopher Baker presented to the office with increased pain, swelling, and redness in the left knee and lower extremity. He reports that he had some redness on the medial portion of the incision this past weekend but home health noticed today that the redness is extended over the entire anterior portion of the knee as well down to the anterior tibia. He  has not felt well the last couple days, feeling very fatigued. Temperature taken in the office 100.1 F. He denies drainage from the incision. He has a history of cellulitis as well as some difficulties with suspected infections in the right total knee. Left knee aspirated in the office with fluid sent for gram stain. Due to the nature of the redness, it was felt he would best be served by admission to the hospital.  Laboratory Data: Admission on 12/20/2011, Discharged on 12/23/2011  Component Date Value Range Status  . ABO/RH(D) 12/20/2011 A POS   Final  . Antibody Screen 12/20/2011 NEG   Final  . Sample Expiration 12/20/2011 12/23/2011   Final  . ABO/RH(D) 12/20/2011 A POS   Final  . WBC 12/21/2011 10.5  4.0 - 10.5 K/uL Final  . RBC 12/21/2011 3.52* 4.22 - 5.81 MIL/uL Final  . Hemoglobin 12/21/2011 11.7* 13.0 - 17.0 g/dL Final  . HCT 14/78/2956 34.7* 39.0 - 52.0 % Final  . MCV 12/21/2011 98.6  78.0 - 100.0 fL Final  . MCH 12/21/2011 33.2  26.0 - 34.0 pg Final  . MCHC 12/21/2011 33.7  30.0 - 36.0 g/dL Final  . RDW 21/30/8657 17.1* 11.5 - 15.5 % Final  . Platelets 12/21/2011 188  150 - 400 K/uL Final  . Sodium  12/21/2011 138  135 - 145 mEq/L Final  . Potassium 12/21/2011 4.5  3.5 - 5.1 mEq/L Final  . Chloride 12/21/2011 106  96 - 112 mEq/L Final  . CO2 12/21/2011 27  19 - 32 mEq/L Final  . Glucose, Bld 12/21/2011 140* 70 - 99 mg/dL Final  . BUN 40/98/1191 10  6 - 23 mg/dL Final  . Creatinine, Ser 12/21/2011 1.02  0.50 - 1.35 mg/dL Final  . Calcium 47/82/9562 8.7  8.4 - 10.5 mg/dL Final  . GFR calc non Af Amer 12/21/2011 69* >90 mL/min Final  . GFR calc Af Amer 12/21/2011 80* >90 mL/min Final   Comment:                                 The eGFR has been calculated                          using the CKD EPI equation.                          This calculation has not been                          validated in all clinical                          situations.                           eGFR's persistently                          <90 mL/min signify                          possible Chronic Kidney Disease.  . WBC 12/22/2011 13.9* 4.0 - 10.5 K/uL Final  . RBC 12/22/2011 3.46* 4.22 - 5.81 MIL/uL Final  . Hemoglobin 12/22/2011 11.5* 13.0 - 17.0 g/dL Final  . HCT 13/01/6577 34.4* 39.0 - 52.0 % Final  . MCV 12/22/2011 99.4  78.0 - 100.0 fL Final  . MCH 12/22/2011 33.2  26.0 - 34.0 pg Final  . MCHC 12/22/2011 33.4  30.0 - 36.0 g/dL Final  . RDW 46/96/2952 16.9* 11.5 - 15.5 % Final  . Platelets 12/22/2011 194  150 - 400 K/uL Final  . Sodium 12/22/2011 136  135 - 145 mEq/L Final  . Potassium 12/22/2011 4.4  3.5 - 5.1 mEq/L Final  . Chloride 12/22/2011 103  96 - 112 mEq/L Final  . CO2 12/22/2011 27  19 - 32 mEq/L Final  . Glucose, Bld 12/22/2011 126* 70 - 99 mg/dL Final  . BUN 84/13/2440 13  6 - 23 mg/dL Final  . Creatinine, Ser 12/22/2011 1.17  0.50 - 1.35 mg/dL Final  . Calcium 04/17/2535 9.0  8.4 - 10.5 mg/dL Final  . GFR calc non Af Amer 12/22/2011 59* >90 mL/min Final  . GFR calc Af Amer 12/22/2011 68* >90 mL/min Final   Comment:                                 The eGFR has been calculated  using the CKD EPI equation.                          This calculation has not been                          validated in all clinical                          situations.                          eGFR's persistently                          <90 mL/min signify                          possible Chronic Kidney Disease.  . Color, Urine 12/21/2011 AMBER* YELLOW Final   BIOCHEMICALS MAY BE AFFECTED BY COLOR  . APPearance 12/21/2011 CLOUDY* CLEAR Final  . Specific Gravity, Urine 12/21/2011 1.024  1.005 - 1.030 Final  . pH 12/21/2011 6.5  5.0 - 8.0 Final  . Glucose, UA 12/21/2011 NEGATIVE  NEGATIVE mg/dL Final  . Hgb urine dipstick 12/21/2011 LARGE* NEGATIVE Final  . Bilirubin Urine 12/21/2011 NEGATIVE  NEGATIVE Final  . Ketones, ur 12/21/2011 NEGATIVE  NEGATIVE  mg/dL Final  . Protein, ur 21/30/8657 >300* NEGATIVE mg/dL Final  . Urobilinogen, UA 12/21/2011 1.0  0.0 - 1.0 mg/dL Final  . Nitrite 84/69/6295 NEGATIVE  NEGATIVE Final  . Leukocytes, UA 12/21/2011 SMALL* NEGATIVE Final  . Specimen Description 12/21/2011 URINE, CATHETERIZED   Final  . Special Requests 12/21/2011 NONE   Final  . Culture  Setup Time 12/21/2011 12/22/2011 01:49   Final  . Colony Count 12/21/2011 NO GROWTH   Final  . Culture 12/21/2011 NO GROWTH   Final  . Report Status 12/21/2011 12/23/2011 FINAL   Final  . WBC, UA 12/21/2011 21-50  <3 WBC/hpf Final  . RBC / HPF 12/21/2011 TOO NUMEROUS TO COUNT  <3 RBC/hpf Final  . Urine-Other 12/21/2011 FIELD OBSCURED BY RBC'S   Final   Comment: URINALYSIS PERFORMED ON SUPERNATANT                          MICROSCOPIC EXAM PERFORMED ON UNCONCENTRATED URINE  . WBC 12/23/2011 14.5* 4.0 - 10.5 K/uL Final  . RBC 12/23/2011 3.17* 4.22 - 5.81 MIL/uL Final  . Hemoglobin 12/23/2011 10.6* 13.0 - 17.0 g/dL Final  . HCT 28/41/3244 31.0* 39.0 - 52.0 % Final  . MCV 12/23/2011 97.8  78.0 - 100.0 fL Final  . MCH 12/23/2011 33.4  26.0 - 34.0 pg Final  . MCHC 12/23/2011 34.2  30.0 - 36.0 g/dL Final  . RDW 06/23/7251 16.8* 11.5 - 15.5 % Final  . Platelets 12/23/2011 207  150 - 400 K/uL Final    Basename 12/29/11 0422 12/28/11 0811 12/27/11 2010  HGB 10.1* 10.2* 11.6*    Basename 12/29/11 0422 12/28/11 0811  WBC 11.1* 8.4  RBC 3.01* 3.02*  HCT 30.0* 30.5*  PLT 332 308    Basename 12/27/11 2010  NA 136  K 4.3  CL 101  CO2 24  BUN 24*  CREATININE 1.30  GLUCOSE 120*  CALCIUM 9.7    Basename 12/27/11 2010  LABPT --  INR 1.38    X-Rays:Dg Chest 2 View  12/16/2011  *RADIOLOGY REPORT*  Clinical Data: Operative evaluation for left total knee arthroplasty.  Atrial fibrillation.  Diabetes.  Smoker  CHEST - 2 VIEW  Comparison: 09/10/1998  Findings: A dual lead pacer is in place via a left subclavian approach with lead tips stable overlying the  right atrial and right ventricular contours.  Heart and mediastinal contours are within normal limits and stable.  The lung fields appear clear with no signs of focal infiltrate or congestive failure.  No pleural fluid or significant peribronchial cuffing is noted.  Bony structures are notable for degenerative changes of both humeral heads and osteophytosis of the mid and lower thoracic spine.  IMPRESSION: Stable cardiopulmonary appearance with no new focal or acute abnormality identified.  Original Report Authenticated By: Bertha Stakes, M.D.    EKG: Orders placed during the hospital encounter of 12/20/11  . EKG     Hospital Course: Patient was admitted to [facility]. For cellulitis.  He was sent to the orthopaedic floor observation.  They were given PO and IV analgesics for pain control.  They were started on antibiotics and restarted on his DVT prophylaxis in the form of Xarelto.  Patient had a decent night on the evening of admission and started to get up OOB with therapy on hospital day two.  Incision on day two and the incision had less erythema and less swelling was noted in the lower leg. Temp has only been low grade. White count was 13.2 on admission.  Kept that day and planned to let home the next day. Continued to monitor the cellulitis and swelling. Allowed him to start working with therapy.  By day three, the leg looked better as compared to the day before.  Patient was seen in rounds and was ready to go home.  Discharge Medications: Prior to Admission medications   Medication Sig Start Date End Date Taking? Authorizing Provider  atorvastatin (LIPITOR) 20 MG tablet Take 20 mg by mouth every evening.   Yes Historical Provider, MD  clotrimazole-betamethasone (LOTRISONE) cream Apply 1 application topically 2 (two) times daily. For psoriasis.   Yes Historical Provider, MD  diclofenac sodium (VOLTAREN) 1 % GEL Apply 1 application topically 2 (two) times daily as needed.  Arthritis in both thumbs.   Yes Historical Provider, MD  estazolam (PROSOM) 2 MG tablet Take 2 mg by mouth at bedtime.   Yes Historical Provider, MD  ezetimibe (ZETIA) 10 MG tablet Take 10 mg by mouth every evening.   Yes Historical Provider, MD  famotidine (PEPCID) 20 MG tablet Take 20 mg by mouth as needed. For indigestion   Yes Historical Provider, MD  HYDROcodone-acetaminophen (NORCO) 5-325 MG per tablet Take 1-2 tablets by mouth every 4 (four) hours as needed. (severe) pain.   Yes Historical Provider, MD  iron polysaccharides (NIFEREX) 150 MG capsule Take 150 mg by mouth 2 (two) times daily.   Yes Historical Provider, MD  methocarbamol (ROBAXIN) 500 MG tablet Take 500 mg by mouth every 6 (six) hours as needed. Muscle spasms.   Yes Historical Provider, MD  OXYBUTYNIN CHLORIDE PO Take 5 mg by mouth 2 (two) times daily.    Yes Historical Provider, MD  pantoprazole (PROTONIX) 40 MG tablet Take 40 mg by mouth 2 (two) times daily.   Yes Historical Provider, MD  predniSONE (DELTASONE) 5 MG tablet Take 5 mg by mouth daily.   Yes Historical Provider, MD  rivaroxaban (XARELTO) 10 MG  TABS tablet Take 10 mg by mouth daily.   Yes Historical Provider, MD  rOPINIRole (REQUIP) 2 MG tablet Take 2 mg by mouth every evening.   Yes Historical Provider, MD  traMADol (ULTRAM) 50 MG tablet Take 50-100 mg by mouth every 6 (six) hours as needed. For mild pain.   Yes Historical Provider, MD  triamcinolone cream (KENALOG) 0.1 % Apply 1 application topically See admin instructions. To affected areas for psoriasis as needed as directed.   Yes Historical Provider, MD  amLODipine (NORVASC) 5 MG tablet TAKE ONE TABLET BY MOUTH DAILY 12/28/11   Stacie Glaze, MD  cephALEXin (KEFLEX) 500 MG capsule Take 1 capsule (500 mg total) by mouth every 8 (eight) hours. 12/29/11 01/08/12  Christopher Perkins, PA    Diet: Cardiac diet Activity:WBAT Follow-up:in 2 days, this Friday with Dr. Lequita Halt. Disposition - Home Discharged  Condition: stable   Discharge Orders    Future Appointments: Provider: Department: Dept Phone: Center:   09/21/2012 2:00 PM [facility] [phone] [facility]   09/21/2012 3:00 PM Evern Bio, NP [facility] [phone] [facility]     Future Orders Please Complete By Expires   Diet - low sodium heart healthy      Diet Carb Modified      Call MD / Call 911      Comments:   If you experience chest pain or shortness of breath, CALL 911 and be transported to the hospital emergency room.  If you develope a fever above 101 F, pus (white drainage) or increased drainage or redness at the wound, or calf pain, call your surgeon's office.   Discharge instructions      Comments:   Pick up stool softner and laxative for home. Do not submerge incision under water. May shower. Continue to use ice for pain and swelling from surgery.  Continue Xarelto as previously prescribed, then discontinue Xarelto.  KEEP LEG ELEVATED WHEN NOT UP WALKING.   Constipation Prevention      Comments:   Drink plenty of fluids.  Prune juice may be helpful.  You may use a stool softener, such as Colace (over the counter) 100 mg twice a day.  Use MiraLax (over the counter) for constipation as needed.   Increase activity slowly as tolerated      Patient may shower      Comments:   You may shower without a dressing once there is no drainage.  Do not wash over the wound.  If drainage remains, do not shower until drainage stops.   Lifting restrictions      Comments:   No lifting until released by the physician.   Change dressing      Comments:   Change dressing daily with sterile 4 x 4 inch gauze dressing and apply TED hose. Do not submerge the incision under water.   Do not put a pillow under the knee. Place it under the heel.      Do not sit on low chairs, stoools or toilet seats, as it may be difficult to get up from low surfaces      Driving restrictions      Comments:   No driving until released by the  physician.     Medication List  As of 12/29/2011 12:53 PM   TAKE these medications         amLODipine 5 MG tablet   Commonly known as: NORVASC   TAKE ONE TABLET BY MOUTH DAILY  atorvastatin 20 MG tablet   Commonly known as: LIPITOR   Take 20 mg by mouth every evening.      cephALEXin 500 MG capsule   Commonly known as: KEFLEX   Take 1 capsule (500 mg total) by mouth every 8 (eight) hours.      clotrimazole-betamethasone cream   Commonly known as: LOTRISONE   Apply 1 application topically 2 (two) times daily. For psoriasis.      diclofenac sodium 1 % Gel   Commonly known as: VOLTAREN   Apply 1 application topically 2 (two) times daily as needed. Arthritis in both thumbs.      estazolam 2 MG tablet   Commonly known as: PROSOM   Take 2 mg by mouth at bedtime.      ezetimibe 10 MG tablet   Commonly known as: ZETIA   Take 10 mg by mouth every evening.      famotidine 20 MG tablet   Commonly known as: PEPCID   Take 20 mg by mouth as needed. For indigestion      HYDROcodone-acetaminophen 5-325 MG per tablet   Commonly known as: NORCO   Take 1-2 tablets by mouth every 4 (four) hours as needed. (severe) pain.      iron polysaccharides 150 MG capsule   Commonly known as: NIFEREX   Take 150 mg by mouth 2 (two) times daily.      methocarbamol 500 MG tablet   Commonly known as: ROBAXIN   Take 500 mg by mouth every 6 (six) hours as needed. Muscle spasms.      OXYBUTYNIN CHLORIDE PO   Take 5 mg by mouth 2 (two) times daily.      pantoprazole 40 MG tablet   Commonly known as: PROTONIX   Take 40 mg by mouth 2 (two) times daily.      predniSONE 5 MG tablet   Commonly known as: DELTASONE   Take 5 mg by mouth daily.      rivaroxaban 10 MG Tabs tablet   Commonly known as: XARELTO   Take 10 mg by mouth daily.      rOPINIRole 2 MG tablet   Commonly known as: REQUIP   Take 2 mg by mouth every evening.      traMADol 50 MG tablet   Commonly known as: ULTRAM   Take  50-100 mg by mouth every 6 (six) hours as needed. For mild pain.      triamcinolone cream 0.1 %   Commonly known as: KENALOG   Apply 1 application topically See admin instructions. To affected areas for psoriasis as needed as directed.           Follow-up Information    Follow up with Christopher Drilling, MD. Schedule an appointment as soon as possible for a visit on 12/31/2011. (Call for appointment and time for this Friday with Dr. Lequita Halt.)    Contact information:   [facility] [address], [address] [place] [address] [phone]          Signed: Patrica Baker 12/29/2011, 12:53 PM

## 2012-01-13 ENCOUNTER — Ambulatory Visit: Payer: Medicare Other | Attending: Orthopedic Surgery

## 2012-01-13 DIAGNOSIS — IMO0001 Reserved for inherently not codable concepts without codable children: Secondary | ICD-10-CM | POA: Insufficient documentation

## 2012-01-13 DIAGNOSIS — M6281 Muscle weakness (generalized): Secondary | ICD-10-CM | POA: Insufficient documentation

## 2012-01-13 DIAGNOSIS — M25569 Pain in unspecified knee: Secondary | ICD-10-CM | POA: Insufficient documentation

## 2012-01-13 DIAGNOSIS — Z96659 Presence of unspecified artificial knee joint: Secondary | ICD-10-CM | POA: Insufficient documentation

## 2012-01-13 DIAGNOSIS — R269 Unspecified abnormalities of gait and mobility: Secondary | ICD-10-CM | POA: Insufficient documentation

## 2012-01-17 ENCOUNTER — Ambulatory Visit: Payer: Medicare Other

## 2012-01-19 ENCOUNTER — Ambulatory Visit: Payer: Medicare Other | Admitting: Physical Therapy

## 2012-01-21 ENCOUNTER — Ambulatory Visit: Payer: Medicare Other | Attending: Orthopedic Surgery | Admitting: Physical Therapy

## 2012-01-21 DIAGNOSIS — Z96659 Presence of unspecified artificial knee joint: Secondary | ICD-10-CM | POA: Insufficient documentation

## 2012-01-21 DIAGNOSIS — R269 Unspecified abnormalities of gait and mobility: Secondary | ICD-10-CM | POA: Insufficient documentation

## 2012-01-21 DIAGNOSIS — M25569 Pain in unspecified knee: Secondary | ICD-10-CM | POA: Insufficient documentation

## 2012-01-21 DIAGNOSIS — IMO0001 Reserved for inherently not codable concepts without codable children: Secondary | ICD-10-CM | POA: Insufficient documentation

## 2012-01-21 DIAGNOSIS — M6281 Muscle weakness (generalized): Secondary | ICD-10-CM | POA: Insufficient documentation

## 2012-01-24 ENCOUNTER — Ambulatory Visit: Payer: Medicare Other

## 2012-01-26 ENCOUNTER — Ambulatory Visit: Payer: Medicare Other

## 2012-01-28 ENCOUNTER — Ambulatory Visit: Payer: Medicare Other | Admitting: Physical Therapy

## 2012-01-31 ENCOUNTER — Ambulatory Visit: Payer: Medicare Other | Admitting: Physical Therapy

## 2012-02-01 ENCOUNTER — Other Ambulatory Visit: Payer: Self-pay | Admitting: Family

## 2012-02-02 ENCOUNTER — Ambulatory Visit: Payer: Medicare Other | Admitting: Physical Therapy

## 2012-02-04 ENCOUNTER — Ambulatory Visit: Payer: Medicare Other | Admitting: Physical Therapy

## 2012-02-07 ENCOUNTER — Ambulatory Visit: Payer: Medicare Other

## 2012-02-09 ENCOUNTER — Ambulatory Visit: Payer: Medicare Other

## 2012-02-10 ENCOUNTER — Ambulatory Visit: Payer: Medicare Other

## 2012-02-11 ENCOUNTER — Encounter: Payer: Medicare Other | Admitting: Physical Therapy

## 2012-02-14 ENCOUNTER — Other Ambulatory Visit: Payer: Self-pay | Admitting: Internal Medicine

## 2012-02-16 ENCOUNTER — Other Ambulatory Visit: Payer: Self-pay | Admitting: Internal Medicine

## 2012-02-17 ENCOUNTER — Other Ambulatory Visit: Payer: Self-pay | Admitting: Internal Medicine

## 2012-02-18 ENCOUNTER — Telehealth: Payer: Self-pay | Admitting: Internal Medicine

## 2012-02-18 NOTE — Telephone Encounter (Signed)
Per Dr Lovell Sheehan, patient should schedule an appointment with Ms Orvan Falconer because he will be out of the office.  Left message on machine for patient to call back and schedule the appointment.

## 2012-02-18 NOTE — Telephone Encounter (Signed)
Pt requesting to be worked in to see Dr. Lovell Sheehan feels that another doctor will not be able to help him pt has recently had a Knee replacement and issues with meds, concerned that the meds he is on are keeping him awake all the time. Requesting to be seen asap. Pt is aware that there is not an appt until the end of October and that Dr, Lovell Sheehan will be out of the office all next week

## 2012-02-22 ENCOUNTER — Ambulatory Visit (INDEPENDENT_AMBULATORY_CARE_PROVIDER_SITE_OTHER): Payer: Medicare Other | Admitting: Family

## 2012-02-22 ENCOUNTER — Encounter: Payer: Self-pay | Admitting: Family

## 2012-02-22 VITALS — BP 138/50 | HR 81 | Temp 98.0°F | Wt 228.0 lb

## 2012-02-22 DIAGNOSIS — R5383 Other fatigue: Secondary | ICD-10-CM

## 2012-02-22 DIAGNOSIS — R413 Other amnesia: Secondary | ICD-10-CM

## 2012-02-22 DIAGNOSIS — E291 Testicular hypofunction: Secondary | ICD-10-CM

## 2012-02-23 ENCOUNTER — Ambulatory Visit: Payer: Medicare Other

## 2012-02-23 ENCOUNTER — Encounter: Payer: Self-pay | Admitting: Family

## 2012-02-23 LAB — CBC WITH DIFFERENTIAL/PLATELET
Basophils Relative: 0.2 % (ref 0.0–3.0)
Eosinophils Absolute: 0 10*3/uL (ref 0.0–0.7)
HCT: 38.9 % — ABNORMAL LOW (ref 39.0–52.0)
Hemoglobin: 12.7 g/dL — ABNORMAL LOW (ref 13.0–17.0)
Lymphocytes Relative: 8.5 % — ABNORMAL LOW (ref 12.0–46.0)
Lymphs Abs: 0.6 10*3/uL — ABNORMAL LOW (ref 0.7–4.0)
MCHC: 32.8 g/dL (ref 30.0–36.0)
Monocytes Relative: 10.1 % (ref 3.0–12.0)
Neutro Abs: 5.9 10*3/uL (ref 1.4–7.7)
RBC: 3.89 Mil/uL — ABNORMAL LOW (ref 4.22–5.81)

## 2012-02-23 LAB — BASIC METABOLIC PANEL
BUN: 23 mg/dL (ref 6–23)
Creatinine, Ser: 1.2 mg/dL (ref 0.4–1.5)
GFR: 61.29 mL/min (ref >60.00)
Potassium: 4.3 mEq/L (ref 3.5–5.1)

## 2012-02-23 LAB — TSH: TSH: 1.7 u[IU]/mL (ref 0.35–5.50)

## 2012-02-23 LAB — VITAMIN B12: Vitamin B-12: 52 pg/mL — ABNORMAL LOW (ref 211–911)

## 2012-02-23 NOTE — Progress Notes (Signed)
Subjective:    Patient ID: Christopher Baker, male    DOB: [birth year], 78 y.o.   MRN: [id number]  HPI 78 year old white male, nonsmoker, patient of Dr. Lovell Sheehan is in today with complaints of fatigue, daytime drowsiness, and short-term memory loss that is worsened over the past several weeks. His wife has concerns that he will drift back off to sleep after dinner around 7 to 7:30 in sleep for about 30-40 minutes about 3-4 times a week. He has no difficulty staying awake when he's active in doing something. Patient reports having to tell him some things 3-4 times before patient actually remembers. Recently, he forgot to pay a bachelor pules resulting in all of his bills being late for that month. Denies any issues with misplacing items putting items in strange places. He has a history of hypogonadism but has had a history of prostate cancer with prostatectomy. Is currently under the care of urology at [facility]. He has recently began to take tramadol but has taken it in the past and has tolerated it well. She's also recently switched from temazepam to ProSom for sleep. He's tolerating the medication well. Denies any feelings of drowsiness first thing in the morning or hangover feelings.   Review of Systems  Constitutional: Positive for fatigue.  HENT: Negative.   Respiratory: Negative.   Cardiovascular: Negative.   Gastrointestinal: Negative.   Genitourinary: Negative.   Musculoskeletal: Negative.   Skin: Negative.   Neurological: Negative.   Hematological: Negative.   Psychiatric/Behavioral: Positive for confusion, disturbed wake/sleep cycle and agitation.   Past Medical History  Diagnosis Date  . Hyperlipidemia   . HTN (hypertension)   . Status post placement of cardiac pacemaker DDD-  06-04-10-- LAST CHECK 09-08-10 IN EPIC    SECONDARY TO SYNCOPY AND BRADYCARDIA  . Restless leg syndrome   . Normal nuclear stress test 2008    LOW RISK--   DR  WALL  . Carotid stenosis, bilateral PER  DR EARLY / DUPLEX  09-09-10      40 - 59%   BILATERALLY--- ASYMPTOMATIC  . History of echocardiogram 11-21-2006    EF 65%  . History of prostate cancer S/P RADIATION  TX  6 YRS AGO  . Psoriasis   . Arthritis with psoriasis   . GERD (gastroesophageal reflux disease)     CONTROLLED W/ PROTONIX  . Borderline diabetes     DIET CONTROLLED - PT STATES HE IS NOT DIABETIC  . Lumbar spondylosis W/ RADICULOPATHY  . Iron deficiency   . Cataract immature LEFT EYE  . PAF (paroxysmal atrial fibrillation)   . SVT (supraventricular tachycardia)     S/P ABLATION   2008  . Coronary artery disease CARDIOLOGIST- DR Sharrell Ku    09-08-10 VISIT AND PACEMAKER CHECK  IN EPIC  . Cancer     HX OF PROSTATE CANCER--TX'D WITH RADIATION  . Sleep apnea TESTED YRS AGO-- NO CPAP RX GIVEN    PT STATES HE DOES NOT THINK HE STILL HAS SLEEP APNEA--DOES NOT USE CPAP    History   Social History  . Marital Status: Married    Spouse Name: N/A    Number of Children: N/A  . Years of Education: N/A   Occupational History  . retired    Social History Main Topics  . Smoking status: Current Some Day Smoker -- 0.1 packs/day for 55 years    Types: Cigarettes  . Smokeless tobacco: Never Used  . Alcohol Use: Yes     RARE  .  Drug Use: No  . Sexually Active: Yes   Other Topics Concern  . Not on file   Social History Narrative  . No narrative on file    Past Surgical History  Procedure Date  . Replacement total knee 1997    RIGHT  . Cardiac pacemaker placement 06-04-10    DDD/ AND REMOVAL LOOR RECORDER  . Loop recorder placement 01-30-10  . Lumbar laminectomy/ diskectomy/ fusion 05-19-10    L4 - 5  . Cholecystectomy 2009  . Cardiac electrophysiology study and ablation 2008    FOR SVT  . Prostate biopsy 2007  . Transurethral resection of prostate 2006  . Inguinal hernia repair 2005    LEFT/   DONE WITH PENILE PROSTESIS SURG.  . Removal and placement penile prosthesis implant  2005    AND LEFT  CORPOROPLASTY /    DONE INGUINAL REPAIR  . Penile prosthesis implant 1995  . Knee arthroscopy 06/02/2011    Procedure: ARTHROSCOPY KNEE;  Surgeon: Loanne Drilling;  Location: [facility];  Service: Orthopedics;  Laterality: Left;  LEFT KNEE ARTHROSCOPY WITH DEBRIDEMENT  . Total knee arthroplasty 12/20/2011    Procedure: TOTAL KNEE ARTHROPLASTY;  Surgeon: Loanne Drilling, MD;  Location: [facility];  Service: Orthopedics;  Laterality: Left;    Family History  Problem Relation Age of Onset  . Stroke Mother   . Early death Father     Allergies  Allergen Reactions  . Ciprofloxacin Nausea Only    REACTION: GI upset  . Other     SCOLLOPS--SUDDEN GI ATTACK    Current Outpatient Prescriptions on File Prior to Visit  Medication Sig Dispense Refill  . amLODipine (NORVASC) 5 MG tablet TAKE ONE TABLET BY MOUTH DAILY  90 tablet  0  . atorvastatin (LIPITOR) 20 MG tablet Take 20 mg by mouth every evening.      . clotrimazole-betamethasone (LOTRISONE) cream Apply 1 application topically 2 (two) times daily. For psoriasis.      Marland Kitchen clotrimazole-betamethasone (LOTRISONE) cream APPLY TOPICALLY 2 (TWO) TIMES DAILY.  30 g  0  . diclofenac sodium (VOLTAREN) 1 % GEL Apply 1 application topically 2 (two) times daily as needed. Arthritis in both thumbs.      . estazolam (PROSOM) 2 MG tablet Take 2 mg by mouth at bedtime.      Marland Kitchen ezetimibe (ZETIA) 10 MG tablet Take 10 mg by mouth every evening.      . famotidine (PEPCID) 20 MG tablet Take 20 mg by mouth as needed. For indigestion      . HYDROcodone-acetaminophen (NORCO) 5-325 MG per tablet Take 1-2 tablets by mouth every 4 (four) hours as needed. (severe) pain.      . iron polysaccharides (NIFEREX) 150 MG capsule Take 150 mg by mouth 2 (two) times daily.      . methocarbamol (ROBAXIN) 500 MG tablet Take 500 mg by mouth every 6 (six) hours as needed. Muscle spasms.      . nabumetone (RELAFEN) 750 MG tablet TAKE TWO TABLETS DAILY  60 tablet  0  .  OXYBUTYNIN CHLORIDE PO Take 5 mg by mouth 2 (two) times daily.       . pantoprazole (PROTONIX) 40 MG tablet Take 40 mg by mouth 2 (two) times daily.      . predniSONE (DELTASONE) 5 MG tablet TAKE 1 TABLET BY MOUTH ONCE A DAY  30 tablet  1  . predniSONE (DELTASONE) 5 MG tablet TAKE 1 TABLET BY MOUTH ONCE A DAY  30 tablet  1  . rOPINIRole (REQUIP) 2 MG tablet TAKE ONE TABLET BY MOUTH DAILY  30 tablet  3  . traMADol (ULTRAM) 50 MG tablet Take 50-100 mg by mouth every 6 (six) hours as needed. For mild pain.      Marland Kitchen triamcinolone cream (KENALOG) 0.1 % Apply 1 application topically See admin instructions. To affected areas for psoriasis as needed as directed.      . rivaroxaban (XARELTO) 10 MG TABS tablet Take 10 mg by mouth daily.        BP 138/50  Pulse 81  Temp 98 F (36.7 C) (Oral)  Wt 228 lb (103.42 kg)  SpO2 97%chart    Objective:   Physical Exam  Constitutional: He is oriented to person, place, and time. He appears well-developed and well-nourished.  HENT:  Right Ear: External ear normal.  Left Ear: External ear normal.  Nose: Nose normal.  Mouth/Throat: Oropharynx is clear and moist.  Neck: Normal range of motion. Neck supple.  Cardiovascular: Normal rate, regular rhythm and normal heart sounds.   Pulmonary/Chest: Effort normal and breath sounds normal.  Abdominal: Soft. Bowel sounds are normal.  Musculoskeletal: Normal range of motion.  Neurological: He is alert and oriented to person, place, and time.  Skin: Skin is warm and dry.  Psychiatric: He has a normal mood and affect.      Clock Draw test: Past  Memory recall: Passed    Assessment & Plan:  Assessment: Short-term memory loss, fatigue  Plan: Considering his history of hypogonadism, believe his memory issues may be related to his testosterone deficiency. However, we will send labs to include CBC, B12 TSH and testosterone level.

## 2012-02-24 ENCOUNTER — Ambulatory Visit (INDEPENDENT_AMBULATORY_CARE_PROVIDER_SITE_OTHER): Payer: Medicare Other

## 2012-02-24 DIAGNOSIS — E538 Deficiency of other specified B group vitamins: Secondary | ICD-10-CM

## 2012-02-24 DIAGNOSIS — Z23 Encounter for immunization: Secondary | ICD-10-CM

## 2012-02-24 MED ORDER — CYANOCOBALAMIN 1000 MCG/ML IJ SOLN
1000.0000 ug | Freq: Once | INTRAMUSCULAR | Status: AC
  Administered 2012-02-24: 1000 ug via INTRAMUSCULAR

## 2012-02-29 ENCOUNTER — Other Ambulatory Visit: Payer: Self-pay | Admitting: Internal Medicine

## 2012-03-22 ENCOUNTER — Ambulatory Visit: Payer: Medicare Other | Admitting: Internal Medicine

## 2012-03-24 ENCOUNTER — Other Ambulatory Visit: Payer: Self-pay | Admitting: Internal Medicine

## 2012-03-27 ENCOUNTER — Other Ambulatory Visit: Payer: Self-pay | Admitting: Internal Medicine

## 2012-03-27 ENCOUNTER — Ambulatory Visit (INDEPENDENT_AMBULATORY_CARE_PROVIDER_SITE_OTHER): Payer: Medicare Other | Admitting: Internal Medicine

## 2012-03-27 DIAGNOSIS — D518 Other vitamin B12 deficiency anemias: Secondary | ICD-10-CM

## 2012-03-27 DIAGNOSIS — D519 Vitamin B12 deficiency anemia, unspecified: Secondary | ICD-10-CM

## 2012-03-27 MED ORDER — CYANOCOBALAMIN 1000 MCG/ML IJ SOLN
1000.0000 ug | INTRAMUSCULAR | Status: DC
  Administered 2012-03-27: 1000 ug via INTRAMUSCULAR

## 2012-04-14 ENCOUNTER — Ambulatory Visit: Payer: Medicare Other | Admitting: Family

## 2012-04-18 ENCOUNTER — Ambulatory Visit: Payer: Medicare Other | Admitting: Family

## 2012-04-26 ENCOUNTER — Ambulatory Visit (INDEPENDENT_AMBULATORY_CARE_PROVIDER_SITE_OTHER): Payer: Medicare Other | Admitting: Internal Medicine

## 2012-04-26 DIAGNOSIS — D518 Other vitamin B12 deficiency anemias: Secondary | ICD-10-CM

## 2012-04-26 DIAGNOSIS — D519 Vitamin B12 deficiency anemia, unspecified: Secondary | ICD-10-CM

## 2012-04-26 MED ORDER — CYANOCOBALAMIN 1000 MCG/ML IJ SOLN
1000.0000 ug | INTRAMUSCULAR | Status: DC
  Administered 2012-04-26: 1000 ug via INTRAMUSCULAR

## 2012-05-08 ENCOUNTER — Other Ambulatory Visit: Payer: Self-pay | Admitting: Internal Medicine

## 2012-05-10 ENCOUNTER — Encounter: Payer: Self-pay | Admitting: *Deleted

## 2012-05-17 ENCOUNTER — Ambulatory Visit (INDEPENDENT_AMBULATORY_CARE_PROVIDER_SITE_OTHER): Payer: Medicare Other | Admitting: *Deleted

## 2012-05-17 ENCOUNTER — Encounter: Payer: Self-pay | Admitting: Internal Medicine

## 2012-05-17 DIAGNOSIS — I4891 Unspecified atrial fibrillation: Secondary | ICD-10-CM

## 2012-05-17 LAB — PACEMAKER DEVICE OBSERVATION
AL AMPLITUDE: 5.6 mv
RV LEAD AMPLITUDE: 11.2 mv
RV LEAD IMPEDENCE PM: 386 Ohm
RV LEAD THRESHOLD: 1 V
VENTRICULAR PACING PM: 2

## 2012-05-17 NOTE — Progress Notes (Signed)
Pacer check in clinic  

## 2012-05-21 ENCOUNTER — Other Ambulatory Visit: Payer: Self-pay | Admitting: Internal Medicine

## 2012-05-24 ENCOUNTER — Ambulatory Visit (INDEPENDENT_AMBULATORY_CARE_PROVIDER_SITE_OTHER): Payer: Medicare Other | Admitting: Internal Medicine

## 2012-05-24 DIAGNOSIS — D519 Vitamin B12 deficiency anemia, unspecified: Secondary | ICD-10-CM

## 2012-05-24 DIAGNOSIS — D518 Other vitamin B12 deficiency anemias: Secondary | ICD-10-CM

## 2012-05-24 MED ORDER — CYANOCOBALAMIN 1000 MCG/ML IJ SOLN
1000.0000 ug | INTRAMUSCULAR | Status: AC
  Administered 2012-05-24: 1000 ug via INTRAMUSCULAR

## 2012-05-29 ENCOUNTER — Other Ambulatory Visit: Payer: Self-pay | Admitting: Internal Medicine

## 2012-05-31 ENCOUNTER — Other Ambulatory Visit: Payer: Self-pay | Admitting: Internal Medicine

## 2012-06-23 ENCOUNTER — Ambulatory Visit (INDEPENDENT_AMBULATORY_CARE_PROVIDER_SITE_OTHER): Payer: Medicare Other | Admitting: Internal Medicine

## 2012-06-23 DIAGNOSIS — D519 Vitamin B12 deficiency anemia, unspecified: Secondary | ICD-10-CM

## 2012-06-23 DIAGNOSIS — D518 Other vitamin B12 deficiency anemias: Secondary | ICD-10-CM

## 2012-06-23 MED ORDER — CYANOCOBALAMIN 1000 MCG/ML IJ SOLN
1000.0000 ug | INTRAMUSCULAR | Status: AC
  Administered 2012-06-23: 1000 ug via INTRAMUSCULAR

## 2012-06-24 ENCOUNTER — Other Ambulatory Visit: Payer: Self-pay | Admitting: Internal Medicine

## 2012-06-26 ENCOUNTER — Other Ambulatory Visit: Payer: Self-pay | Admitting: *Deleted

## 2012-06-28 ENCOUNTER — Other Ambulatory Visit: Payer: Self-pay | Admitting: Internal Medicine

## 2012-06-30 ENCOUNTER — Other Ambulatory Visit: Payer: Self-pay | Admitting: Internal Medicine

## 2012-07-17 ENCOUNTER — Other Ambulatory Visit: Payer: Self-pay | Admitting: Internal Medicine

## 2012-07-25 ENCOUNTER — Telehealth: Payer: Self-pay | Admitting: Radiation Oncology

## 2012-07-25 ENCOUNTER — Ambulatory Visit (INDEPENDENT_AMBULATORY_CARE_PROVIDER_SITE_OTHER): Payer: Medicare Other | Admitting: *Deleted

## 2012-07-25 DIAGNOSIS — E538 Deficiency of other specified B group vitamins: Secondary | ICD-10-CM

## 2012-07-25 MED ORDER — CYANOCOBALAMIN 1000 MCG/ML IJ SOLN
1000.0000 ug | Freq: Once | INTRAMUSCULAR | Status: AC
  Administered 2012-07-25: 1000 ug via INTRAMUSCULAR

## 2012-07-25 NOTE — Telephone Encounter (Signed)
Faxed NPE 08/16/05, EOT 02/01/06. FUP 02/10/06 to Alice Reichert, [facility] [place] [facility] [place], [phone].  OK per MAM.  Received confirmation.

## 2012-08-15 ENCOUNTER — Other Ambulatory Visit: Payer: Self-pay | Admitting: Internal Medicine

## 2012-08-22 ENCOUNTER — Telehealth: Payer: Self-pay | Admitting: Internal Medicine

## 2012-08-22 DIAGNOSIS — D508 Other iron deficiency anemias: Secondary | ICD-10-CM

## 2012-08-22 DIAGNOSIS — E538 Deficiency of other specified B group vitamins: Secondary | ICD-10-CM

## 2012-08-22 NOTE — Telephone Encounter (Signed)
Spoke with patient and lab appointment made. Order placed.

## 2012-08-22 NOTE — Telephone Encounter (Signed)
Pt would like to if its time for b12 level to be check

## 2012-08-22 NOTE — Telephone Encounter (Signed)
Please tell him he can come now for b12 level

## 2012-08-23 ENCOUNTER — Other Ambulatory Visit (INDEPENDENT_AMBULATORY_CARE_PROVIDER_SITE_OTHER): Payer: Medicare Other

## 2012-08-23 DIAGNOSIS — D508 Other iron deficiency anemias: Secondary | ICD-10-CM

## 2012-08-30 ENCOUNTER — Other Ambulatory Visit: Payer: Self-pay | Admitting: Internal Medicine

## 2012-08-31 ENCOUNTER — Telehealth: Payer: Self-pay | Admitting: Internal Medicine

## 2012-08-31 NOTE — Telephone Encounter (Signed)
Pt would like b12 level results

## 2012-08-31 NOTE — Telephone Encounter (Signed)
Low-151 is b12 level--no b12 available-please advise

## 2012-09-01 ENCOUNTER — Telehealth: Payer: Self-pay | Admitting: Internal Medicine

## 2012-09-01 MED ORDER — CYANOCOBALAMIN 500 MCG/0.1ML NA SOLN: 1.0000 mL | NASAL | Status: DC

## 2012-09-01 NOTE — Telephone Encounter (Signed)
Spouse states that pt is receiving Vt B12 shots for memory. He has been talking with the office who had advised that pt might benefit from the Vit B12 nasal spray.  Spouse states insurance will not cover the medication and it will cost 400$.  Spouse wants pt to continue the Vit B12 injections.  OFFICE PLEASE FOLLOW UP WITH CALLER AS TO WHEN PT CAN GET HIS INJECTION:  SHE STATES HE IS PAST DUE.

## 2012-09-01 NOTE — Telephone Encounter (Signed)
Wife Christopher Baker calling back in regards to the Vitamin B12 Rx that is going to cost she and her husband $400.00.  Looking for a substitution.  They missed call back by the office. Reviewed Epic and advised that Dr. Lovell Sheehan was hoping their insurance and pharmacy would accept Vitamin B12 sublingual Dots bid.  Ms. Tandy is going to check with her insurance and pharmacy and see if these are covered and call back for follow up . Understanding expressed.

## 2012-09-01 NOTE — Telephone Encounter (Signed)
Call in nascobal one 50 mcg a week ( nasal b 12)

## 2012-09-01 NOTE — Telephone Encounter (Signed)
Left message on machine for patient and Rx sent to pharmacy. 

## 2012-09-01 NOTE — Telephone Encounter (Signed)
No follow up required, closing encounter. °

## 2012-09-01 NOTE — Telephone Encounter (Signed)
As  As with his wifewith his wife had recommended B. 12 dots these are sublingual B12 tablets you take twice daily

## 2012-09-04 ENCOUNTER — Telehealth: Payer: Self-pay | Admitting: Internal Medicine

## 2012-09-04 MED ORDER — CYANOCOBALAMIN 500 MCG/0.1ML NA SOLN: 1.0000 mL | NASAL | Status: DC

## 2012-09-04 NOTE — Telephone Encounter (Signed)
Rx sent to pharmacy   

## 2012-09-04 NOTE — Telephone Encounter (Signed)
Pt has found some b12 please call [facility]. Pt stated [facility] has 13 vial and they want all 13 vials. Pt wife Marylu Lund can not take b12 nasal spray INS will not pay. Pt hus would like to know if his wife call take b12 inj

## 2012-09-06 ENCOUNTER — Encounter: Payer: Self-pay | Admitting: Family

## 2012-09-06 ENCOUNTER — Ambulatory Visit (INDEPENDENT_AMBULATORY_CARE_PROVIDER_SITE_OTHER): Payer: Medicare Other | Admitting: Family

## 2012-09-06 VITALS — BP 140/80 | HR 86 | Temp 98.6°F | Wt 235.0 lb

## 2012-09-06 DIAGNOSIS — K219 Gastro-esophageal reflux disease without esophagitis: Secondary | ICD-10-CM

## 2012-09-06 DIAGNOSIS — D510 Vitamin B12 deficiency anemia due to intrinsic factor deficiency: Secondary | ICD-10-CM

## 2012-09-06 MED ORDER — CYANOCOBALAMIN 1000 MCG/ML IJ SOLN
1000.0000 ug | Freq: Once | INTRAMUSCULAR | Status: AC
  Administered 2012-09-06: 1000 ug via INTRAMUSCULAR

## 2012-09-06 NOTE — Patient Instructions (Signed)
B12 injection biweekly x 2 months, then monthly.   Diet for Gastroesophageal Reflux Disease, Adult Reflux (acid reflux) is when acid from your stomach flows up into the esophagus. When acid comes in contact with the esophagus, the acid causes irritation and soreness (inflammation) in the esophagus. When reflux happens often or so severely that it causes damage to the esophagus, it is called gastroesophageal reflux disease (GERD). Nutrition therapy can help ease the discomfort of GERD. FOODS OR DRINKS TO AVOID OR LIMIT  Smoking or chewing tobacco. Nicotine is one of the most potent stimulants to acid production in the gastrointestinal tract.  Caffeinated and decaffeinated coffee and black tea.  Regular or low-calorie carbonated beverages or energy drinks (caffeine-free carbonated beverages are allowed).   Strong spices, such as black pepper, white pepper, red pepper, cayenne, curry powder, and chili powder.  Peppermint or spearmint.  Chocolate.  High-fat foods, including meats and fried foods. Extra added fats including oils, butter, salad dressings, and nuts. Limit these to less than 8 tsp per day.  Fruits and vegetables if they are not tolerated, such as citrus fruits or tomatoes.  Alcohol.  Any food that seems to aggravate your condition. If you have questions regarding your diet, call your caregiver or a registered dietitian. OTHER THINGS THAT MAY HELP GERD INCLUDE:   Eating your meals slowly, in a relaxed setting.  Eating 5 to 6 small meals per day instead of 3 large meals.  Eliminating food for a period of time if it causes distress.  Not lying down until 3 hours after eating a meal.  Keeping the head of your bed raised 6 to 9 inches (15 to 23 cm) by using a foam wedge or blocks under the legs of the bed. Lying flat may make symptoms worse.  Being physically active. Weight loss may be helpful in reducing reflux in overweight or obese adults.  Wear loose fitting  clothing EXAMPLE MEAL PLAN This meal plan is approximately 2,000 calories based on https://www.bern[facility].org/ meal planning guidelines. Breakfast   cup cooked oatmeal.  1 cup strawberries.  1 cup low-fat milk.  1 oz almonds. Snack  1 cup cucumber slices.  6 oz yogurt (made from low-fat or fat-free milk). Lunch  2 slice whole-wheat bread.  2 oz sliced [place].  2 tsp mayonnaise.  1 cup blueberries.  1 cup snap peas. Snack  6 whole-wheat crackers.  1 oz string cheese. Dinner   cup brown rice.  1 cup mixed veggies.  1 tsp olive oil.  3 oz grilled fish. Document Released: 06/07/2005 Document Revised: 08/30/2011 Document Reviewed: 04/23/2011 [facility] Patient Information 2013 [place], [place].

## 2012-09-06 NOTE — Progress Notes (Signed)
Subjective:    Patient ID: Christopher Baker, male    DOB: July 31, 1933, 79 y.o.   MRN: [id number]  HPI 79 year old WM, nonsmoker, patient of Dr. Lovell Sheehan is in with c/o uncontrolled GERD. He has been taking Protonix that is not helping. Has c/o heartburn, indigestion, and epigastric pain. Has been taking Pepcid with the protonix at times and it helps. Denies any chest pain or palpitations.   Has a history of pernicious anemia and has been taking B12 injections. The pharmacy has been out of b12 and he has been out of medication x 2 weeks. Has noticed a decrease in concentration and increase in fatigue. Last B12 was 180.     Review of Systems  Constitutional: Negative.   Respiratory: Negative.   Cardiovascular: Negative.   Gastrointestinal: Positive for abdominal pain. Negative for nausea, diarrhea and constipation.       Heartburn and indigestion  Genitourinary: Negative.   Musculoskeletal: Negative.   Skin: Negative.   Neurological: Negative.   Hematological: Negative.   Psychiatric/Behavioral: Negative.    Past Medical History  Diagnosis Date  . Hyperlipidemia   . HTN (hypertension)   . Status post placement of cardiac pacemaker DDD-  06-04-10-- LAST CHECK 09-08-10 IN EPIC    SECONDARY TO SYNCOPY AND BRADYCARDIA  . Restless leg syndrome   . Normal nuclear stress test 2008    LOW RISK--   DR  WALL  . Carotid stenosis, bilateral PER DR EARLY / DUPLEX  09-09-10      40 - 59%   BILATERALLY--- ASYMPTOMATIC  . History of echocardiogram 11-21-2006    EF 65%  . History of prostate cancer S/P RADIATION  TX  6 YRS AGO  . Psoriasis   . Arthritis with psoriasis   . GERD (gastroesophageal reflux disease)     CONTROLLED W/ PROTONIX  . Borderline diabetes     DIET CONTROLLED - PT STATES HE IS NOT DIABETIC  . Lumbar spondylosis W/ RADICULOPATHY  . Iron deficiency   . Cataract immature LEFT EYE  . PAF (paroxysmal atrial fibrillation)   . SVT (supraventricular tachycardia)     S/P ABLATION    2008  . Coronary artery disease CARDIOLOGIST- DR Sharrell Ku    09-08-10 VISIT AND PACEMAKER CHECK  IN EPIC  . Cancer     HX OF PROSTATE CANCER--TX'D WITH RADIATION  . Sleep apnea TESTED YRS AGO-- NO CPAP RX GIVEN    PT STATES HE DOES NOT THINK HE STILL HAS SLEEP APNEA--DOES NOT USE CPAP    History   Social History  . Marital Status: Married    Spouse Name: N/A    Number of Children: N/A  . Years of Education: N/A   Occupational History  . retired    Social History Main Topics  . Smoking status: Current Some Day Smoker -- 0.10 packs/day for 55 years    Types: Cigarettes  . Smokeless tobacco: Never Used  . Alcohol Use: Yes     Comment: RARE  . Drug Use: No  . Sexually Active: Yes   Other Topics Concern  . Not on file   Social History Narrative  . No narrative on file    Past Surgical History  Procedure Laterality Date  . Replacement total knee  1997    RIGHT  . Cardiac pacemaker placement  06-04-10    DDD/ AND REMOVAL LOOR RECORDER  . Loop recorder placement  01-30-10  . Lumbar laminectomy/ diskectomy/ fusion  05-19-10  L4 - 5  . Cholecystectomy  2009  . Cardiac electrophysiology study and ablation  2008    FOR SVT  . Prostate biopsy  2007  . Transurethral resection of prostate  2006  . Inguinal hernia repair  2005    LEFT/   DONE WITH PENILE PROSTESIS SURG.  . Removal and placement penile prosthesis implant   2005    AND LEFT CORPOROPLASTY /    DONE INGUINAL REPAIR  . Penile prosthesis implant  1995  . Knee arthroscopy  06/02/2011    Procedure: ARTHROSCOPY KNEE;  Surgeon: Loanne Drilling;  Location: [facility];  Service: Orthopedics;  Laterality: Left;  LEFT KNEE ARTHROSCOPY WITH DEBRIDEMENT  . Total knee arthroplasty  12/20/2011    Procedure: TOTAL KNEE ARTHROPLASTY;  Surgeon: Loanne Drilling, MD;  Location: [facility];  Service: Orthopedics;  Laterality: Left;    Family History  Problem Relation Age of Onset  . Stroke Mother   . Early  death Father     Allergies  Allergen Reactions  . Ciprofloxacin Nausea Only    REACTION: GI upset  . Other     SCOLLOPS--SUDDEN GI ATTACK    Current Outpatient Prescriptions on File Prior to Visit  Medication Sig Dispense Refill  . amLODipine (NORVASC) 5 MG tablet TAKE ONE TABLET BY MOUTH DAILY  90 tablet  3  . atorvastatin (LIPITOR) 20 MG tablet TAKE ONE TABLET BY MOUTH DAILY  30 tablet  3  . clotrimazole-betamethasone (LOTRISONE) cream Apply 1 application topically 2 (two) times daily. For psoriasis.      . Cyanocobalamin (NASCOBAL) 500 MCG/0.1ML SOLN Place 1 mL (5,000 mcg total) into the nose once a week.  1.3 mL  12  . diclofenac sodium (VOLTAREN) 1 % GEL Apply 1 application topically 2 (two) times daily as needed. Arthritis in both thumbs.      . estazolam (PROSOM) 2 MG tablet TAKE ONE TABLET BY MOUTH AT BEDTIME AS NEEDED FOR INSOMNIA  30 tablet  5  . famotidine (PEPCID) 20 MG tablet Take 20 mg by mouth as needed. For indigestion      . iron polysaccharides (NIFEREX) 150 MG capsule Take 150 mg by mouth 2 (two) times daily.      . methotrexate (RHEUMATREX) 2.5 MG tablet TAKE FOUR TABLETS BY MOUTH ONCE A WEEK.  16 tablet  10  . nabumetone (RELAFEN) 750 MG tablet TAKE TWO TABLETS DAILY  60 tablet  6  . OXYBUTYNIN CHLORIDE PO Take 5 mg by mouth 2 (two) times daily.       . pantoprazole (PROTONIX) 40 MG tablet Take 40 mg by mouth 2 (two) times daily.      . pantoprazole (PROTONIX) 40 MG tablet TAKE ONE TABLET BY MOUTH TWICE DAILY  60 tablet  5  . POLY-IRON 150 150 MG capsule TAKE ONE CAPSULE BY MOUTH TWICE DAILY  60 capsule  10  . predniSONE (DELTASONE) 5 MG tablet TAKE 1 TABLET BY MOUTH ONCE A DAY  30 tablet  2  . rOPINIRole (REQUIP) 2 MG tablet TAKE ONE TABLET BY MOUTH DAILY  30 tablet  1  . traMADol (ULTRAM) 50 MG tablet Take 50-100 mg by mouth every 6 (six) hours as needed. For mild pain.      Marland Kitchen triamcinolone cream (KENALOG) 0.1 % Apply 1 application topically See admin  instructions. To affected areas for psoriasis as needed as directed.      Marland Kitchen ZETIA 10 MG tablet TAKE ONE TABLET BY  MOUTH DAILY  30 tablet  7   No current facility-administered medications on file prior to visit.    BP 140/80  Pulse 86  Temp(Src) 98.6 F (37 C) (Oral)  Wt 235 lb (106.595 kg)  BMI 29.37 kg/m2  SpO2 98%chart    Objective:   Physical Exam  Constitutional: He is oriented to person, place, and time. He appears well-developed and well-nourished.  HENT:  Right Ear: External ear normal.  Left Ear: External ear normal.  Nose: Nose normal.  Mouth/Throat: Oropharynx is clear and moist.  Neck: Normal range of motion. Neck supple.  Cardiovascular: Normal rate, regular rhythm and normal heart sounds.   Pulmonary/Chest: Effort normal and breath sounds normal.  Abdominal: Soft. Bowel sounds are normal.  Neurological: He is alert and oriented to person, place, and time.  Skin: Skin is warm and dry.  Psychiatric: He has a normal mood and affect.          Assessment & Plan:  Assessment:  1. GERD 2. Pernicious Anemia  Plan: D/C Protonix. Start Nexium 40mg  once a day. Increase B12 injections to biweekly x 2 months then monthly. Recheck in 6 weeks. Call the office if symptoms worsen or persist. Recheck as scheduled and as needed.

## 2012-09-12 ENCOUNTER — Encounter: Payer: Self-pay | Admitting: Internal Medicine

## 2012-09-19 ENCOUNTER — Ambulatory Visit: Payer: Self-pay | Admitting: Vascular Surgery

## 2012-09-19 ENCOUNTER — Other Ambulatory Visit: Payer: Self-pay

## 2012-09-21 ENCOUNTER — Other Ambulatory Visit: Payer: Medicare Other

## 2012-09-21 ENCOUNTER — Ambulatory Visit: Payer: Medicare Other | Admitting: Neurosurgery

## 2012-09-28 ENCOUNTER — Telehealth: Payer: Self-pay | Admitting: Radiation Oncology

## 2012-09-28 NOTE — Telephone Encounter (Signed)
[employer] claim form to MAM 09/28/12.

## 2012-10-04 ENCOUNTER — Other Ambulatory Visit: Payer: Self-pay

## 2012-10-04 ENCOUNTER — Ambulatory Visit (INDEPENDENT_AMBULATORY_CARE_PROVIDER_SITE_OTHER): Payer: Medicare Other | Admitting: Internal Medicine

## 2012-10-04 DIAGNOSIS — D519 Vitamin B12 deficiency anemia, unspecified: Secondary | ICD-10-CM

## 2012-10-04 DIAGNOSIS — D518 Other vitamin B12 deficiency anemias: Secondary | ICD-10-CM

## 2012-10-04 MED ORDER — CYANOCOBALAMIN 1000 MCG/ML IJ SOLN
1000.0000 ug | INTRAMUSCULAR | Status: AC
  Administered 2012-10-04: 1000 ug via INTRAMUSCULAR

## 2012-10-04 MED ORDER — ATORVASTATIN CALCIUM 20 MG PO TABS: ORAL_TABLET | ORAL | Status: DC

## 2012-10-09 ENCOUNTER — Telehealth: Payer: Self-pay | Admitting: Radiation Oncology

## 2012-10-09 NOTE — Telephone Encounter (Signed)
Error in previous note.  Did not send itemized bill.  Faxed request to Iven Finn to send bill, [phone].

## 2012-10-09 NOTE — Telephone Encounter (Signed)
Faxed EOT 02/01/06 and itemized bill to [employer], [phone].  OK per MAM.  Received confirmation.

## 2012-10-18 ENCOUNTER — Other Ambulatory Visit (INDEPENDENT_AMBULATORY_CARE_PROVIDER_SITE_OTHER): Payer: Medicare Other

## 2012-10-18 ENCOUNTER — Other Ambulatory Visit: Payer: Medicare Other

## 2012-10-18 DIAGNOSIS — E538 Deficiency of other specified B group vitamins: Secondary | ICD-10-CM

## 2012-10-18 DIAGNOSIS — D510 Vitamin B12 deficiency anemia due to intrinsic factor deficiency: Secondary | ICD-10-CM

## 2012-10-18 MED ORDER — CYANOCOBALAMIN 1000 MCG/ML IJ SOLN
1000.0000 ug | INTRAMUSCULAR | Status: AC
  Administered 2012-10-18: 1000 ug via INTRAMUSCULAR

## 2012-10-24 ENCOUNTER — Other Ambulatory Visit: Payer: Self-pay | Admitting: Internal Medicine

## 2012-11-06 ENCOUNTER — Encounter: Payer: Self-pay | Admitting: Internal Medicine

## 2012-11-07 ENCOUNTER — Other Ambulatory Visit: Payer: Self-pay | Admitting: *Deleted

## 2012-11-07 ENCOUNTER — Ambulatory Visit (INDEPENDENT_AMBULATORY_CARE_PROVIDER_SITE_OTHER): Payer: Medicare Other | Admitting: *Deleted

## 2012-11-07 DIAGNOSIS — D518 Other vitamin B12 deficiency anemias: Secondary | ICD-10-CM

## 2012-11-07 DIAGNOSIS — D519 Vitamin B12 deficiency anemia, unspecified: Secondary | ICD-10-CM

## 2012-11-07 MED ORDER — CYANOCOBALAMIN 1000 MCG/ML IJ SOLN
1000.0000 ug | INTRAMUSCULAR | Status: AC
  Administered 2012-11-07: 1000 ug via INTRAMUSCULAR

## 2012-11-20 ENCOUNTER — Encounter: Payer: Self-pay | Admitting: Vascular Surgery

## 2012-11-21 ENCOUNTER — Encounter: Payer: Self-pay | Admitting: Internal Medicine

## 2012-11-21 ENCOUNTER — Encounter: Payer: Self-pay | Admitting: Vascular Surgery

## 2012-11-21 ENCOUNTER — Other Ambulatory Visit (INDEPENDENT_AMBULATORY_CARE_PROVIDER_SITE_OTHER): Payer: Medicare Other

## 2012-11-21 ENCOUNTER — Ambulatory Visit (INDEPENDENT_AMBULATORY_CARE_PROVIDER_SITE_OTHER): Payer: Medicare Other | Admitting: Internal Medicine

## 2012-11-21 ENCOUNTER — Ambulatory Visit (INDEPENDENT_AMBULATORY_CARE_PROVIDER_SITE_OTHER): Payer: Medicare Other | Admitting: Vascular Surgery

## 2012-11-21 VITALS — BP 133/74 | HR 86 | Ht 75.0 in | Wt 231.0 lb

## 2012-11-21 DIAGNOSIS — I6529 Occlusion and stenosis of unspecified carotid artery: Secondary | ICD-10-CM

## 2012-11-21 DIAGNOSIS — I10 Essential (primary) hypertension: Secondary | ICD-10-CM

## 2012-11-21 DIAGNOSIS — E785 Hyperlipidemia, unspecified: Secondary | ICD-10-CM

## 2012-11-21 DIAGNOSIS — I4891 Unspecified atrial fibrillation: Secondary | ICD-10-CM

## 2012-11-21 DIAGNOSIS — Z950 Presence of cardiac pacemaker: Secondary | ICD-10-CM

## 2012-11-21 LAB — PACEMAKER DEVICE OBSERVATION
AL IMPEDENCE PM: 443 Ohm
AL THRESHOLD: 0.5 V
ATRIAL PACING PM: 49
BATTERY VOLTAGE: 2.79 V
VENTRICULAR PACING PM: 4

## 2012-11-21 NOTE — Progress Notes (Signed)
HPI [name] returns today for followup. He is a very pleasant 79 year old man with a history of unexplained syncope, symptomatic bradycardia, status post permanent pacemaker insertion, and hypertension. He c/o weakness, especially on the golf course. He notes that he cannot drive the ball well and also has fatigue. No syncope. He has trace edema and wears support stockings.  Allergies  Allergen Reactions  . Ciprofloxacin Nausea Only    REACTION: GI upset  . Other     SCOLLOPS--SUDDEN GI ATTACK     Current Outpatient Prescriptions  Medication Sig Dispense Refill  . amLODipine (NORVASC) 5 MG tablet TAKE ONE TABLET BY MOUTH DAILY  90 tablet  3  . atorvastatin (LIPITOR) 20 MG tablet TAKE ONE TABLET BY MOUTH DAILY  30 tablet  3  . clotrimazole-betamethasone (LOTRISONE) cream Apply 1 application topically 2 (two) times daily. For psoriasis.      . Cyanocobalamin (NASCOBAL) 500 MCG/0.1ML SOLN Place 1 mL (5,000 mcg total) into the nose once a week.  1.3 mL  12  . diclofenac sodium (VOLTAREN) 1 % GEL Apply 1 application topically 2 (two) times daily as needed. Arthritis in both thumbs.      . estazolam (PROSOM) 2 MG tablet TAKE ONE TABLET BY MOUTH AT BEDTIME AS NEEDED FOR INSOMNIA  30 tablet  5  . famotidine (PEPCID) 20 MG tablet Take 20 mg by mouth as needed. For indigestion      . iron polysaccharides (NIFEREX) 150 MG capsule Take 150 mg by mouth 2 (two) times daily.      . methotrexate (RHEUMATREX) 2.5 MG tablet TAKE FOUR TABLETS BY MOUTH ONCE A WEEK.  16 tablet  10  . nabumetone (RELAFEN) 750 MG tablet TAKE TWO TABLETS DAILY  60 tablet  6  . OXYBUTYNIN CHLORIDE PO Take 5 mg by mouth 2 (two) times daily.       . pantoprazole (PROTONIX) 40 MG tablet Take 40 mg by mouth 2 (two) times daily.      . predniSONE (DELTASONE) 5 MG tablet TAKE 1 TABLET BY MOUTH ONCE A DAY  30 tablet  2  . rOPINIRole (REQUIP) 2 MG tablet TAKE ONE TABLET BY MOUTH DAILY  30 tablet  0  . ZETIA 10 MG tablet TAKE ONE  TABLET BY MOUTH DAILY  30 tablet  7   No current facility-administered medications for this visit.     Past Medical History  Diagnosis Date  . Hyperlipidemia   . HTN (hypertension)   . Status post placement of cardiac pacemaker DDD-  06-04-10-- LAST CHECK 09-08-10 IN EPIC    SECONDARY TO SYNCOPY AND BRADYCARDIA  . Restless leg syndrome   . Normal nuclear stress test 2008    LOW RISK--   DR  WALL  . Carotid stenosis, bilateral PER DR EARLY / DUPLEX  09-09-10      40 - 59%   BILATERALLY--- ASYMPTOMATIC  . History of echocardiogram 11-21-2006    EF 65%  . History of prostate cancer S/P RADIATION  TX  6 YRS AGO  . Psoriasis   . Arthritis with psoriasis   . GERD (gastroesophageal reflux disease)     CONTROLLED W/ PROTONIX  . Borderline diabetes     DIET CONTROLLED - PT STATES HE IS NOT DIABETIC  . Lumbar spondylosis W/ RADICULOPATHY  . Iron deficiency   . Cataract immature LEFT EYE  . PAF (paroxysmal atrial fibrillation)   . SVT (supraventricular tachycardia)     S/P ABLATION   2008  .  Coronary artery disease CARDIOLOGIST- DR Christopher Baker    09-08-10 VISIT AND PACEMAKER CHECK  IN EPIC  . Cancer     HX OF PROSTATE CANCER--TX'D WITH RADIATION  . Sleep apnea TESTED YRS AGO-- NO CPAP RX GIVEN    PT STATES HE DOES NOT THINK HE STILL HAS SLEEP APNEA--DOES NOT USE CPAP    ROS:   All systems reviewed and negative except as noted in the HPI.   Past Surgical History  Procedure Laterality Date  . Replacement total knee  1997    RIGHT  . Cardiac pacemaker placement  06-04-10    DDD/ AND REMOVAL LOOR RECORDER  . Loop recorder placement  01-30-10  . Lumbar laminectomy/ diskectomy/ fusion  05-19-10    L4 - 5  . Cholecystectomy  2009  . Cardiac electrophysiology study and ablation  2008    FOR SVT  . Prostate biopsy  2007  . Transurethral resection of prostate  2006  . Inguinal hernia repair  2005    LEFT/   DONE WITH PENILE PROSTESIS SURG.  . Removal and placement penile  prosthesis implant   2005    AND LEFT CORPOROPLASTY /    DONE INGUINAL REPAIR  . Penile prosthesis implant  1995  . Knee arthroscopy  06/02/2011    Procedure: ARTHROSCOPY KNEE;  Surgeon: Christopher Baker;  Location: [facility];  Service: Orthopedics;  Laterality: Left;  LEFT KNEE ARTHROSCOPY WITH DEBRIDEMENT  . Total knee arthroplasty  12/20/2011    Procedure: TOTAL KNEE ARTHROPLASTY;  Surgeon: Christopher Drilling, MD;  Location: [facility];  Service: Orthopedics;  Laterality: Left;     Family History  Problem Relation Age of Onset  . Stroke Mother   . Early death Father      History   Social History  . Marital Status: Married    Spouse Name: N/A    Number of Children: N/A  . Years of Education: N/A   Occupational History  . retired    Social History Main Topics  . Smoking status: Current Some Day Smoker -- 0.10 packs/day for 55 years    Types: Cigarettes  . Smokeless tobacco: Never Used  . Alcohol Use: Yes     Comment: RARE  . Drug Use: No  . Sexually Active: Yes   Other Topics Concern  . Not on file   Social History Narrative  . No narrative on file     BP 133/74  Pulse 86  Ht 6\' 3"  (1.905 m)  Wt 231 lb (104.781 kg)  BMI 28.87 kg/m2  Physical Exam:  Well appearing 79 yo man, NAD HEENT: Unremarkable Neck:  7 cm JVD, no thyromegally Back:  No CVA tenderness Lungs:  Clear with no wheezes HEART:  Regular rate rhythm, no murmurs, no rubs, no clicks Abd:  soft, positive bowel sounds, no organomegally, no rebound, no guarding Ext:  2 plus pulses, no edema, no cyanosis, no clubbing Skin:  No rashes no nodules Neuro:  CN II through XII intact, motor grossly intact  DEVICE  Normal device function.  See PaceArt for details.   Assess/Plan:

## 2012-11-21 NOTE — Assessment & Plan Note (Signed)
His medtronic DDD PPM is working normally. Will recheck in several months.  

## 2012-11-21 NOTE — Assessment & Plan Note (Signed)
His blood pressure is well controlled. He'll continue his current medical therapy. 

## 2012-11-21 NOTE — Assessment & Plan Note (Signed)
The patient has weakness and muscle aches and fatigue. Mostly weakness and fatigue. I've instructed him to hold his statin therapy for one month. If his symptoms improve, we will have him permanently discontinue Lipitor, and consider a different therapy. In the interim, he will maintain a low-fat diet.

## 2012-11-21 NOTE — Patient Instructions (Addendum)
Your physician wants you to follow-up in: 12 months with Dr Marvell Fuller will receive a reminder letter in the mail two months in advance. If you don't receive a letter, please call our office to schedule the follow-up appointment.   Remote monitoring is used to monitor your Pacemaker of ICD from home. This monitoring reduces the number of office visits required to check your device to one time per year. It allows [place] to keep an eye on the functioning of your device to ensure it is working properly. You are scheduled for a device check from home on 02/27/13. You may send your transmission at any time that day. If you have a wireless device, the transmission will be sent automatically. After your physician reviews your transmission, you will receive a postcard with your next transmission date.   Call [place] Amante Fomby,RN---[phone]

## 2012-11-21 NOTE — Progress Notes (Signed)
[facility] [place] HISTORY AND PHYSICAL   CC:  Follow up carotid duplex scan  Referring Provider:  Stacie Glaze, MD  HPI: This is a 79 y.o. male who has known carotid stenosis is here for f/u carotid duplex scan.  Denies amaurosis fugax, paresthesias, or hemiparesis.  He states that he does smoke one cigarette per month.  He states that he does have weakness in his legs and Dr. Ladona Ridgel told him today to stop his statin for one month to see if the weakness improves.  His PPM is also working well.  Past Medical History  Diagnosis Date  . Hyperlipidemia   . HTN (hypertension)   . Status post placement of cardiac pacemaker DDD-  06-04-10-- LAST CHECK 09-08-10 IN EPIC    SECONDARY TO SYNCOPY AND BRADYCARDIA  . Restless leg syndrome   . Normal nuclear stress test 2008    LOW RISK--   DR  WALL  . Carotid stenosis, bilateral PER DR EARLY / DUPLEX  09-09-10      40 - 59%   BILATERALLY--- ASYMPTOMATIC  . History of echocardiogram 11-21-2006    EF 65%  . History of prostate cancer S/P RADIATION  TX  6 YRS AGO  . Psoriasis   . Arthritis with psoriasis   . GERD (gastroesophageal reflux disease)     CONTROLLED W/ PROTONIX  . Borderline diabetes     DIET CONTROLLED - PT STATES HE IS NOT DIABETIC  . Lumbar spondylosis W/ RADICULOPATHY  . Iron deficiency   . Cataract immature LEFT EYE  . PAF (paroxysmal atrial fibrillation)   . SVT (supraventricular tachycardia)     S/P ABLATION   2008  . Coronary artery disease CARDIOLOGIST- DR Sharrell Ku    09-08-10 VISIT AND PACEMAKER CHECK  IN EPIC  . Cancer     HX OF PROSTATE CANCER--TX'D WITH RADIATION  . Sleep apnea TESTED YRS AGO-- NO CPAP RX GIVEN    PT STATES HE DOES NOT THINK HE STILL HAS SLEEP APNEA--DOES NOT USE CPAP   Past Surgical History  Procedure Laterality Date  . Replacement total knee  1997    RIGHT  . Cardiac pacemaker placement  06-04-10    DDD/ AND REMOVAL LOOR RECORDER  . Loop recorder placement   01-30-10  . Lumbar laminectomy/ diskectomy/ fusion  05-19-10    L4 - 5  . Cholecystectomy  2009  . Cardiac electrophysiology study and ablation  2008    FOR SVT  . Prostate biopsy  2007  . Transurethral resection of prostate  2006  . Inguinal hernia repair  2005    LEFT/   DONE WITH PENILE PROSTESIS SURG.  . Removal and placement penile prosthesis implant   2005    AND LEFT CORPOROPLASTY /    DONE INGUINAL REPAIR  . Penile prosthesis implant  1995  . Knee arthroscopy  06/02/2011    Procedure: ARTHROSCOPY KNEE;  Surgeon: Loanne Drilling;  Location: [facility];  Service: Orthopedics;  Laterality: Left;  LEFT KNEE ARTHROSCOPY WITH DEBRIDEMENT  . Total knee arthroplasty  12/20/2011    Procedure: TOTAL KNEE ARTHROPLASTY;  Surgeon: Loanne Drilling, MD;  Location: [facility];  Service: Orthopedics;  Laterality: Left;  . Back surgery      Allergies  Allergen Reactions  . Ciprofloxacin Nausea Only    REACTION: GI upset  . Other     SCOLLOPS--SUDDEN GI ATTACK    Current Outpatient Prescriptions  Medication Sig Dispense  Refill  . amLODipine (NORVASC) 5 MG tablet TAKE ONE TABLET BY MOUTH DAILY  90 tablet  3  . atorvastatin (LIPITOR) 20 MG tablet TAKE ONE TABLET BY MOUTH DAILY  30 tablet  3  . clotrimazole-betamethasone (LOTRISONE) cream Apply 1 application topically 2 (two) times daily. For psoriasis.      . Cyanocobalamin (NASCOBAL) 500 MCG/0.1ML SOLN Place 1 mL (5,000 mcg total) into the nose once a week.  1.3 mL  12  . diclofenac sodium (VOLTAREN) 1 % GEL Apply 1 application topically 2 (two) times daily as needed. Arthritis in both thumbs.      . estazolam (PROSOM) 2 MG tablet TAKE ONE TABLET BY MOUTH AT BEDTIME AS NEEDED FOR INSOMNIA  30 tablet  5  . famotidine (PEPCID) 20 MG tablet Take 20 mg by mouth as needed. For indigestion      . iron polysaccharides (NIFEREX) 150 MG capsule Take 150 mg by mouth 2 (two) times daily.      . methotrexate (RHEUMATREX) 2.5 MG tablet TAKE  FOUR TABLETS BY MOUTH ONCE A WEEK.  16 tablet  10  . nabumetone (RELAFEN) 750 MG tablet TAKE TWO TABLETS DAILY  60 tablet  6  . OXYBUTYNIN CHLORIDE PO Take 5 mg by mouth 2 (two) times daily.       . pantoprazole (PROTONIX) 40 MG tablet Take 40 mg by mouth 2 (two) times daily.      . predniSONE (DELTASONE) 5 MG tablet TAKE 1 TABLET BY MOUTH ONCE A DAY  30 tablet  2  . rOPINIRole (REQUIP) 2 MG tablet TAKE ONE TABLET BY MOUTH DAILY  30 tablet  0  . ZETIA 10 MG tablet TAKE ONE TABLET BY MOUTH DAILY  30 tablet  7   No current facility-administered medications for this visit.    Family History  Problem Relation Age of Onset  . Stroke Mother   . Early death Father     History   Social History  . Marital Status: Married    Spouse Name: N/A    Number of Children: N/A  . Years of Education: N/A   Occupational History  . retired    Social History Main Topics  . Smoking status: Current Some Day Smoker -- 0.10 packs/day for 55 years    Types: Cigarettes  . Smokeless tobacco: Never Used     Comment: pt statest he smokes about "once a month"   . Alcohol Use: Yes     Comment: RARE  . Drug Use: No  . Sexually Active: Yes   Other Topics Concern  . Not on file   Social History Narrative  . No narrative on file     ROS: [x]  Positive   [ ]  Negative   [ ]  All sytems reviewed and are negative  General: [ ]  Weight loss, [ ]  Fever, [ ]  chills Neurologic: [ ]  Dizziness, [ ]  Blackouts, [ ]  Seizure [ ]  Stroke, [ ]  "Mini stroke", [ ]  Slurred speech, [ ]  Temporary blindness; [ x] weakness in arms or legs-bilaterally-discontinuing statin for one month at this time per Dr. Ladona Ridgel, [ ]  Hoarseness Cardiac: [ ]  Chest pain/pressure, [ ]  Shortness of breath at rest [ ]  Shortness of breath with exertion, [x ] Hx of Atrial fibrillation or irregular heartbeat; [x]  Hx PPM Vascular: [ ]  Pain in legs with walking, [ ]  Pain in legs at rest, [ ]  Pain in legs at night,  [ ]  Non-healing ulcer, [ ]  Blood clot  in vein/DVT,   Pulmonary: [ ]  Home oxygen, [ ]  Productive cough, [ ]  Coughing up blood, [ ]  Asthma,  [ ]  Wheezing Musculoskeletal:  [ ]  Arthritis, [ ]  Low back pain, [ ]  Joint pain Hematologic: [ ]  Easy Bruising, [ ]  Anemia; [ ]  Hepatitis Gastrointestinal: [ ]  Blood in stool, [ ]  Gastroesophageal Reflux/heartburn, [ ]  Trouble swallowing Urinary: [ ]  chronic Kidney disease, [ ]  on HD - [ ]  MWF or [ ]  TTHS, [ ]  Burning with urination, [ ]  Difficulty urinating  [x]  Hx of Prostate CA with radiation [x]  frequency Endocrine: [x ] borderline diabetes, [ ]  hx of thyroid disease Skin: [ ]  Rashes, [ ]  Wounds Psychological: [ ]  Anxiety, [ ]  Depression   PHYSICAL EXAMINATION:  Filed Vitals:   11/21/12 1348  BP: 148/68  Pulse:    Body mass index is 28.95 kg/(m^2).  General:  WDWN in NAD Gait: Normal HENT: WNL; normocephalic Eyes: PERRL Pulmonary: normal non-labored breathing , without Rales, rhonchi,  wheezing Cardiac: RRR, without  Murmurs, rubs or gallops; without carotid bruits Abdomen: soft, NT, no masses Skin: without rashes,  ulcers  Vascular Exam/Pulses: palpable bilateral radial pulses; no carotid bruits are heard; BLE edema with compression stockings in place Extremities: without ischemic changes, without Gangrene , without cellulitis; without open wounds;  Musculoskeletal: without muscle wasting or atrophy  Neurologic: A&O X 3; Appropriate Affect ; SENSATION: normal; MOTOR FUNCTION:  moving all extremities equally. Speech is fluent/normal   Non-Invasive Vascular Imaging: Carotid Duplex Scan:  11/21/2012  1.  40-59% right ICA stenosis 2. 40-59% left ICA stenosis 3. Bilateral heterogenous plaque is noted 2-2.5 cm into the ICA  ASSESSMENT/PLAN: 79 y.o. male here for f/u carotid duplex scan.   -pt doing well and he has asymptomatic bilateral ICA stenosis in the 40-59% range -will get f/u carotid duplex in one year -Dr. Ladona Ridgel is discontinuing his statin for now to see if his  bilateral lower extremity weakness improves   Doreatha Massed, PA-C [facility] [phone]  Clinic MD:   Pt seen and examined in conjunction with Dr. Arbie Cookey  For VQI Use Only    PRE-ADM LIVING: [x ] Home, [ ]  Nursing home, [ ]  Homeless  AMB STATUS: [ x] Walking, [ ]  Walking w/ Assistance, [ ]  Wheelchair, [ ] Bed ridden  RECENT HEART ATTACK (<6 mon): No  CAD Sx: [ x] No, [ ]  Asx, h/o MI, [ ]  Stable angina, [ ]  Unstable angina  PRIOR CHF: [x ] No, [ ]  Asx, [ ]  Mild, [ ]  Moderate, [ ]  Severe  STRESS TEST: [x ] No, [ ]  Normal, [ ]  + ischemia, [ ]  + MI, [ ]  Both  I have examined the patient, reviewed and agree with above.  EARLY, TODD, MD 11/21/2012 2:55 PM

## 2012-11-27 ENCOUNTER — Other Ambulatory Visit: Payer: Self-pay | Admitting: Internal Medicine

## 2012-11-29 ENCOUNTER — Other Ambulatory Visit: Payer: Self-pay | Admitting: *Deleted

## 2012-11-29 ENCOUNTER — Ambulatory Visit (INDEPENDENT_AMBULATORY_CARE_PROVIDER_SITE_OTHER): Payer: Medicare Other | Admitting: Internal Medicine

## 2012-11-29 ENCOUNTER — Encounter: Payer: Self-pay | Admitting: Internal Medicine

## 2012-11-29 VITALS — BP 130/78 | HR 76 | Temp 98.0°F | Resp 16 | Ht 75.0 in | Wt 233.0 lb

## 2012-11-29 DIAGNOSIS — I10 Essential (primary) hypertension: Secondary | ICD-10-CM

## 2012-11-29 DIAGNOSIS — I4891 Unspecified atrial fibrillation: Secondary | ICD-10-CM

## 2012-11-29 DIAGNOSIS — T887XXA Unspecified adverse effect of drug or medicament, initial encounter: Secondary | ICD-10-CM

## 2012-11-29 DIAGNOSIS — L4050 Arthropathic psoriasis, unspecified: Secondary | ICD-10-CM

## 2012-11-29 DIAGNOSIS — D519 Vitamin B12 deficiency anemia, unspecified: Secondary | ICD-10-CM

## 2012-11-29 DIAGNOSIS — R413 Other amnesia: Secondary | ICD-10-CM

## 2012-11-29 DIAGNOSIS — K219 Gastro-esophageal reflux disease without esophagitis: Secondary | ICD-10-CM

## 2012-11-29 DIAGNOSIS — D518 Other vitamin B12 deficiency anemias: Secondary | ICD-10-CM

## 2012-11-29 MED ORDER — CYANOCOBALAMIN 1000 MCG/ML IJ SOLN
1000.0000 ug | Freq: Once | INTRAMUSCULAR | Status: AC
  Administered 2012-11-29: 1000 ug via INTRAMUSCULAR

## 2012-11-29 MED ORDER — CITALOPRAM HYDROBROMIDE 20 MG PO TABS: 20.0000 mg | ORAL_TABLET | Freq: Every day | ORAL | Status: DC

## 2012-11-29 MED ORDER — ESOMEPRAZOLE MAGNESIUM 40 MG PO CPDR: 40.0000 mg | DELAYED_RELEASE_CAPSULE | Freq: Two times a day (BID) | ORAL | Status: DC

## 2012-11-29 NOTE — Progress Notes (Signed)
Subjective:    Patient ID: Christopher Baker, male    DOB: Jul 05, 1933, 79 y.o.   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  He passes the clock test with these and I believe that his primary symptomology is mild to moderate depression and he agrees with this.  He is also increasing gastroesophageal reflux symptoms and rarely at night when he lies down they have been severe enough that he has awoken from sleep    Review of Systems  Constitutional: Positive for fatigue. Negative for fever.  HENT: Negative for hearing loss, congestion, neck pain and postnasal drip.   Eyes: Negative for discharge, redness and visual disturbance.  Respiratory: Negative for cough, shortness of breath and wheezing.   Cardiovascular: Negative for leg swelling.  Gastrointestinal: Positive for abdominal pain. Negative for constipation and abdominal distention.        GERD  Genitourinary: Negative for urgency and frequency.  Musculoskeletal: Negative for joint swelling and arthralgias.  Skin: Positive for color change. Negative for rash.  Neurological: Negative for weakness and light-headedness.  Hematological: Negative for adenopathy.  Psychiatric/Behavioral: Positive for sleep disturbance and decreased concentration. Negative for behavioral problems.   Past Medical History  Diagnosis Date  . Hyperlipidemia   . HTN (hypertension)   . Status post placement of cardiac pacemaker DDD-  06-04-10-- LAST CHECK 09-08-10 IN EPIC    SECONDARY TO SYNCOPY AND BRADYCARDIA  . Restless leg syndrome   . Normal nuclear stress test 2008    LOW RISK--   DR  WALL  . Carotid stenosis, bilateral PER DR EARLY / DUPLEX  09-09-10      40 - 59%   BILATERALLY--- ASYMPTOMATIC  . History of echocardiogram 11-21-2006    EF 65%  . History of prostate  cancer S/P RADIATION  TX  6 YRS AGO  . Psoriasis   . Arthritis with psoriasis   . GERD (gastroesophageal reflux disease)     CONTROLLED W/ PROTONIX  . Borderline diabetes     DIET CONTROLLED - PT STATES HE IS NOT DIABETIC  . Lumbar spondylosis W/ RADICULOPATHY  . Iron deficiency   . Cataract immature LEFT EYE  . PAF (paroxysmal atrial fibrillation)   . SVT (supraventricular tachycardia)     S/P ABLATION   2008  . Coronary artery disease CARDIOLOGIST- DR Sharrell Ku    09-08-10 VISIT AND PACEMAKER CHECK  IN EPIC  . Cancer     HX OF PROSTATE CANCER--TX'D WITH RADIATION  . Sleep apnea TESTED YRS AGO-- NO CPAP RX GIVEN    PT STATES HE DOES NOT THINK HE STILL HAS SLEEP APNEA--DOES NOT USE CPAP    History   Social History  . Marital Status: Married    Spouse Name: N/A    Number of Children: N/A  . Years of Education: N/A   Occupational History  . retired    Social History Main Topics  . Smoking status: Current Some Day Smoker -- 0.10 packs/day for 55 years    Types: Cigarettes  . Smokeless tobacco: Never Used     Comment: pt statest he smokes about "once a month"   . Alcohol Use: Yes     Comment: RARE  . Drug Use: No  . Sexually Active: Yes   Other Topics Concern  . Not on file  Social History Narrative  . No narrative on file    Past Surgical History  Procedure Laterality Date  . Replacement total knee  1997    RIGHT  . Cardiac pacemaker placement  06-04-10    DDD/ AND REMOVAL LOOR RECORDER  . Loop recorder placement  01-30-10  . Lumbar laminectomy/ diskectomy/ fusion  05-19-10    L4 - 5  . Cholecystectomy  2009  . Cardiac electrophysiology study and ablation  2008    FOR SVT  . Prostate biopsy  2007  . Transurethral resection of prostate  2006  . Inguinal hernia repair  2005    LEFT/   DONE WITH PENILE PROSTESIS SURG.  . Removal and placement penile prosthesis implant   2005    AND LEFT CORPOROPLASTY /    DONE INGUINAL REPAIR  . Penile prosthesis  implant  1995  . Knee arthroscopy  06/02/2011    Procedure: ARTHROSCOPY KNEE;  Surgeon: Loanne Drilling;  Location: [facility];  Service: Orthopedics;  Laterality: Left;  LEFT KNEE ARTHROSCOPY WITH DEBRIDEMENT  . Total knee arthroplasty  12/20/2011    Procedure: TOTAL KNEE ARTHROPLASTY;  Surgeon: Loanne Drilling, MD;  Location: [facility];  Service: Orthopedics;  Laterality: Left;  . Back surgery      Family History  Problem Relation Age of Onset  . Stroke Mother   . Early death Father     Allergies  Allergen Reactions  . Ciprofloxacin Nausea Only    REACTION: GI upset  . Other     SCOLLOPS--SUDDEN GI ATTACK    Current Outpatient Prescriptions on File Prior to Visit  Medication Sig Dispense Refill  . amLODipine (NORVASC) 5 MG tablet TAKE ONE TABLET BY MOUTH DAILY  90 tablet  3  . atorvastatin (LIPITOR) 20 MG tablet TAKE ONE TABLET BY MOUTH DAILY  30 tablet  3  . clotrimazole-betamethasone (LOTRISONE) cream Apply 1 application topically 2 (two) times daily. For psoriasis.      . Cyanocobalamin (NASCOBAL) 500 MCG/0.1ML SOLN Place 1 mL (5,000 mcg total) into the nose once a week.  1.3 mL  12  . diclofenac sodium (VOLTAREN) 1 % GEL Apply 1 application topically 2 (two) times daily as needed. Arthritis in both thumbs.      . estazolam (PROSOM) 2 MG tablet TAKE ONE TABLET BY MOUTH AT BEDTIME AS NEEDED FOR INSOMNIA  30 tablet  5  . iron polysaccharides (NIFEREX) 150 MG capsule Take 150 mg by mouth 2 (two) times daily.      . methotrexate (RHEUMATREX) 2.5 MG tablet TAKE FOUR TABLETS BY MOUTH ONCE A WEEK.  16 tablet  10  . nabumetone (RELAFEN) 750 MG tablet TAKE TWO TABLETS DAILY  60 tablet  6  . OXYBUTYNIN CHLORIDE PO Take 5 mg by mouth 2 (two) times daily.       . predniSONE (DELTASONE) 5 MG tablet TAKE 1 TABLET BY MOUTH ONCE A DAY  30 tablet  6  . rOPINIRole (REQUIP) 2 MG tablet TAKE ONE TABLET BY MOUTH DAILY  30 tablet  6  . ZETIA 10 MG tablet TAKE ONE TABLET BY MOUTH DAILY   30 tablet  7   No current facility-administered medications on file prior to visit.    BP 130/78  Pulse 76  Temp(Src) 98 F (36.7 C)  Resp 16  Ht 6\' 3"  (1.905 m)  Wt 233 lb (105.688 kg)  BMI 29.12 kg/m2       Objective:   Physical  Exam  Nursing note and vitals reviewed. Constitutional: He appears well-developed and well-nourished.  Depressed affect  HENT:  Head: Normocephalic and atraumatic.  Eyes: Conjunctivae are normal. Pupils are equal, round, and reactive to light.  Neck: Normal range of motion. Neck supple.  Cardiovascular: Normal rate and regular rhythm.   Pulmonary/Chest: Effort normal and breath sounds normal.  Abdominal: Soft. Bowel sounds are normal.          Assessment & Plan:  Trial of Celexa 20 mg by mouth daily for depression.  Change to dexilant after one week of medications  at 60 mg by mouth twice a day for GERD symptomology is symptomology persistswe will refer to gastroenterology for EGD

## 2012-11-29 NOTE — Patient Instructions (Addendum)
nexium to replace protonix b12 deficiency  Add folate    1mg  daily

## 2012-11-29 NOTE — Addendum Note (Signed)
Addended by: Alfred Levins D on: 11/29/2012 11:54 AM   Modules accepted: Orders

## 2012-12-04 ENCOUNTER — Other Ambulatory Visit: Payer: Self-pay | Admitting: Internal Medicine

## 2012-12-13 ENCOUNTER — Ambulatory Visit (INDEPENDENT_AMBULATORY_CARE_PROVIDER_SITE_OTHER): Payer: Medicare Other | Admitting: Internal Medicine

## 2012-12-13 DIAGNOSIS — E538 Deficiency of other specified B group vitamins: Secondary | ICD-10-CM

## 2012-12-13 DIAGNOSIS — D518 Other vitamin B12 deficiency anemias: Secondary | ICD-10-CM

## 2012-12-13 DIAGNOSIS — D519 Vitamin B12 deficiency anemia, unspecified: Secondary | ICD-10-CM

## 2012-12-13 MED ORDER — CYANOCOBALAMIN 1000 MCG/ML IJ SOLN
1000.0000 ug | Freq: Once | INTRAMUSCULAR | Status: AC
  Administered 2012-12-13: 1000 ug via INTRAMUSCULAR

## 2012-12-16 ENCOUNTER — Other Ambulatory Visit: Payer: Self-pay | Admitting: Internal Medicine

## 2012-12-24 ENCOUNTER — Other Ambulatory Visit: Payer: Self-pay | Admitting: Internal Medicine

## 2012-12-28 ENCOUNTER — Other Ambulatory Visit: Payer: Self-pay | Admitting: Internal Medicine

## 2013-01-05 ENCOUNTER — Ambulatory Visit (INDEPENDENT_AMBULATORY_CARE_PROVIDER_SITE_OTHER): Payer: Medicare Other | Admitting: Internal Medicine

## 2013-01-05 DIAGNOSIS — D519 Vitamin B12 deficiency anemia, unspecified: Secondary | ICD-10-CM

## 2013-01-05 DIAGNOSIS — D518 Other vitamin B12 deficiency anemias: Secondary | ICD-10-CM

## 2013-01-05 MED ORDER — CYANOCOBALAMIN 1000 MCG/ML IJ SOLN
1000.0000 ug | INTRAMUSCULAR | Status: AC
  Administered 2013-01-05: 1000 ug via INTRAMUSCULAR

## 2013-01-19 ENCOUNTER — Ambulatory Visit (INDEPENDENT_AMBULATORY_CARE_PROVIDER_SITE_OTHER): Payer: Medicare Other | Admitting: Internal Medicine

## 2013-01-19 DIAGNOSIS — E538 Deficiency of other specified B group vitamins: Secondary | ICD-10-CM

## 2013-01-19 MED ORDER — CYANOCOBALAMIN 1000 MCG/ML IJ SOLN
1000.0000 ug | INTRAMUSCULAR | Status: DC
  Administered 2013-01-19: 1000 ug via INTRAMUSCULAR

## 2013-01-31 ENCOUNTER — Telehealth: Payer: Self-pay | Admitting: Internal Medicine

## 2013-01-31 NOTE — Telephone Encounter (Signed)
Patient has seen you in the past for fatigue.  Made this as a follow up appt.  Fatigue has started again.  He would like some lab tests ordered and a follow up with Dr. Lovell Sheehan if needed.  Originally checked Dr. Ronnette Hila schedule for follow up availability.  Next appt was in Dec.

## 2013-01-31 NOTE — Telephone Encounter (Signed)
If he needs more labs and is planning to see Dr. Lovell Sheehan, then Dr. Lovell Sheehan can order what he deems appropriate.

## 2013-01-31 NOTE — Telephone Encounter (Signed)
Patient Information:  Caller Name: [name]  Phone: [phone]  Patient: Besnik, Febus  Gender: Male  DOB: 08-18-1933  Age: 79 Years  PCP: Darryll Capers (Adults only)  Office Follow Up:  Does the office need to follow up with this patient?: Yes  Instructions For The Office: Please review - contact patient for Appt since none found,  Wanting to see MD. Please contact patieint for urgent Appt [phone].   Symptoms  Reason For Call & Symptoms: Patient has been sleeping a lot since starting Celexa, Vesicare and Myrbetriq in June 2014.  Usually up in the mornings, has coffee and breakfast then sits down and goes to sleep.  Any time he sits will go to sleep.  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  Vacation last week, would sleep sitting up while others around him active.  Will sit and go to sleep after lunch, after dinner.  Has noticed tremor in Right hand at intervals for 2 weeks.  Tried to put ice cream in refrigerator, when told lower into freezer, put it in vegetable drawer, reminded again and finally got in the freezer below the refrig. Difficulty getting out of chair, feels Right arm gradually getting weaker - notes harder to hold coffee..  Sometimes loosing words he wants to say. Wife says she notes confusion at intervals.  Gradually worse over the last 3-4 weeks.  Reviewed Health History In EMR: Yes  Reviewed Medications In EMR: Yes  Reviewed Allergies In EMR: Yes  Reviewed Surgeries / Procedures: Yes  Date of Onset of Symptoms: Unknown  Guideline(s) Used:  Neurologic Deficit  Disposition Per Guideline:   See Today in Office  Reason For Disposition Reached:   Patient wants to be seen  Advice Given:  N/A  Patient Will Follow Care Advice:  YES

## 2013-02-02 ENCOUNTER — Other Ambulatory Visit: Payer: Self-pay | Admitting: Internal Medicine

## 2013-02-02 DIAGNOSIS — R251 Tremor, unspecified: Secondary | ICD-10-CM

## 2013-02-02 NOTE — Telephone Encounter (Signed)
Please speak to the pt and remove him from Padonda's schedule if needed.

## 2013-02-05 ENCOUNTER — Ambulatory Visit: Payer: Medicare Other | Admitting: Family

## 2013-02-05 ENCOUNTER — Ambulatory Visit (INDEPENDENT_AMBULATORY_CARE_PROVIDER_SITE_OTHER): Payer: Medicare Other | Admitting: Internal Medicine

## 2013-02-05 DIAGNOSIS — D518 Other vitamin B12 deficiency anemias: Secondary | ICD-10-CM

## 2013-02-05 DIAGNOSIS — D519 Vitamin B12 deficiency anemia, unspecified: Secondary | ICD-10-CM

## 2013-02-05 MED ORDER — CYANOCOBALAMIN 1000 MCG/ML IJ SOLN
1000.0000 ug | INTRAMUSCULAR | Status: AC
  Administered 2013-02-05: 1000 ug via INTRAMUSCULAR

## 2013-02-20 ENCOUNTER — Telehealth: Payer: Self-pay | Admitting: Internal Medicine

## 2013-02-20 ENCOUNTER — Ambulatory Visit (INDEPENDENT_AMBULATORY_CARE_PROVIDER_SITE_OTHER): Payer: Medicare Other | Admitting: *Deleted

## 2013-02-20 DIAGNOSIS — E538 Deficiency of other specified B group vitamins: Secondary | ICD-10-CM

## 2013-02-20 MED ORDER — CYANOCOBALAMIN 1000 MCG/ML IJ SOLN
1000.0000 ug | Freq: Once | INTRAMUSCULAR | Status: AC
  Administered 2013-02-20: 1000 ug via INTRAMUSCULAR

## 2013-02-20 NOTE — Telephone Encounter (Signed)
[facility] Triage Call Report Triage Record Num: [id number] Operator: Kelle Darting Patient Name: Christopher Baker Call Date & Time: 02/16/2013 5:06:17PM Patient Phone: PCP: Darryll Capers Patient Gender: Male PCP Fax : [phone] Patient DOB: 12-23-1933 [facility] Name[facility] Reason for Call: Caller: [name]/Spouse; PCP: Darryll Capers (Adults only); CB#: [phone]; Cell: [phone]; Call regarding Increased sleeping; Afebrile; Onset: 4 weeks ago; Sx notes: Was placed on 4 new medications and since has started sleeping more heavy, unable to wake him verbally, has to shake his arm, if not 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 had been taking every night, does have an appt. with the Neurologist on 02/22/13; Also advised that "Concurrent use of Vesicare and Celexa may result in an increased risk of QT interval prolongation" via Micromedex, states she will discuss with the provider; Note to office. Protocol(s) Used: Office Note Recommended Outcome per Protocol: Information Noted and Sent to Office Reason for Outcome: Caller information to office Care Advice: ~ 08/

## 2013-02-22 ENCOUNTER — Ambulatory Visit (INDEPENDENT_AMBULATORY_CARE_PROVIDER_SITE_OTHER): Payer: Medicare Other | Admitting: Neurology

## 2013-02-22 ENCOUNTER — Encounter: Payer: Self-pay | Admitting: Neurology

## 2013-02-22 VITALS — BP 150/70 | HR 80 | Temp 97.7°F | Ht 75.0 in | Wt 234.0 lb

## 2013-02-22 DIAGNOSIS — G251 Drug-induced tremor: Secondary | ICD-10-CM

## 2013-02-22 DIAGNOSIS — G250 Essential tremor: Secondary | ICD-10-CM

## 2013-02-22 DIAGNOSIS — E538 Deficiency of other specified B group vitamins: Secondary | ICD-10-CM

## 2013-02-22 DIAGNOSIS — R413 Other amnesia: Secondary | ICD-10-CM

## 2013-02-22 DIAGNOSIS — T43591A Poisoning by other antipsychotics and neuroleptics, accidental (unintentional), initial encounter: Secondary | ICD-10-CM

## 2013-02-22 NOTE — Progress Notes (Signed)
NEUROLOGY CONSULTATION NOTE  Christopher Baker MRN: [id number] DOB: [birth year]  Referring provider: Dr. Lovell Sheehan Primary care provider: Dr. Lovell Sheehan  Reason for consult:  Memory problems, cognitive clouding, tremor  HISTORY OF PRESENT ILLNESS: Christopher Baker is a 79 year old man with hypertension, hyperlipidemia, atrial fibrillation, asthma, B12 deficiency and psoriasis who presents for memory problems and tremor.  He is accompanied by his wife.  Records and images were personally reviewed where available.    Symptoms started about 1.5 years ago.  At that time, he was repeating things and exhibited mental fogginess and lack of motivation.  He was experiencing depression and insomnia as well.  Procedural tasks and executive functioning were intact.  He was found to have a B12 level of 52 and was started on B12 shots.  He and his wife had noticed improvement in his memory.  However, insomnia and depression were still both an issue.  He has been under a lot of stress.  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  He also purchased a new home while still trying to sell his other condo, resulting in 1-2 years of two house payments.  He scored well on mini mental status testing.  This past June, he was started on Celexa for depression and Proson for insomnia.  His urologist had started him on Vesicare and Myrbetriq to control urinary issues as a result of prostate cancer and radiation in 2007.  Over the summer, he exhibited extreme lethargy, to the point where he was sleeping during the day, which he never previously done.  Sometimes, he was in such a deep sleep, it required repeated yelling and shaking to wake him up.  He seemed more "foggy" again.  One time, he couldn't figure out where to put the ice cream in the refrigerator.  When his wife told him to put it in the freezer, he was placing it in the vegetable drawer.  He decided to stop the Proson, and he and his wife  noticed significant improvement in lethargy.  However, he noticed a mild tremor in his hands, noticeable when he holds a utensil or writes.  He first noticed this about a month or so ago.  He has some balance issues due to bilateral knee replacement surgeries.  No family history of dementia or tremor (possibly his mother had tremor).  Labs in 2014: B12 180 in March, 293 in April (currently receiving injections) Labs in 2013: TSH 1.70 MRI Brain w/wo (01/16/10): minimal small vessel ischemic changes.  PAST MEDICAL HISTORY: Past Medical History  Diagnosis Date  . Hyperlipidemia   . HTN (hypertension)   . Status post placement of cardiac pacemaker DDD-  06-04-10-- LAST CHECK 09-08-10 IN EPIC    SECONDARY TO SYNCOPY AND BRADYCARDIA  . Restless leg syndrome   . Normal nuclear stress test 2008    LOW RISK--   DR  WALL  . Carotid stenosis, bilateral PER DR EARLY / DUPLEX  09-09-10      40 - 59%   BILATERALLY--- ASYMPTOMATIC  . History of echocardiogram 11-21-2006    EF 65%  . History of prostate cancer S/P RADIATION  TX  6 YRS AGO  . Psoriasis   . Arthritis with psoriasis   . GERD (gastroesophageal reflux disease)     CONTROLLED W/ PROTONIX  . Borderline diabetes     DIET CONTROLLED - PT STATES HE IS NOT DIABETIC  . Lumbar spondylosis W/ RADICULOPATHY  . Iron  deficiency   . Cataract immature LEFT EYE  . PAF (paroxysmal atrial fibrillation)   . SVT (supraventricular tachycardia)     S/P ABLATION   2008  . Coronary artery disease CARDIOLOGIST- DR Sharrell Ku    09-08-10 VISIT AND PACEMAKER CHECK  IN EPIC  . Cancer     HX OF PROSTATE CANCER--TX'D WITH RADIATION  . Sleep apnea TESTED YRS AGO-- NO CPAP RX GIVEN    PT STATES HE DOES NOT THINK HE STILL HAS SLEEP APNEA--DOES NOT USE CPAP    PAST SURGICAL HISTORY: Past Surgical History  Procedure Laterality Date  . Replacement total knee  1997    RIGHT  . Cardiac pacemaker placement  06-04-10    DDD/ AND REMOVAL LOOR RECORDER  . Loop  recorder placement  01-30-10  . Lumbar laminectomy/ diskectomy/ fusion  05-19-10    L4 - 5  . Cholecystectomy  2009  . Cardiac electrophysiology study and ablation  2008    FOR SVT  . Prostate biopsy  2007  . Transurethral resection of prostate  2006  . Inguinal hernia repair  2005    LEFT/   DONE WITH PENILE PROSTESIS SURG.  . Removal and placement penile prosthesis implant   2005    AND LEFT CORPOROPLASTY /    DONE INGUINAL REPAIR  . Penile prosthesis implant  1995  . Knee arthroscopy  06/02/2011    Procedure: ARTHROSCOPY KNEE;  Surgeon: Loanne Drilling;  Location: [facility];  Service: Orthopedics;  Laterality: Left;  LEFT KNEE ARTHROSCOPY WITH DEBRIDEMENT  . Total knee arthroplasty  12/20/2011    Procedure: TOTAL KNEE ARTHROPLASTY;  Surgeon: Loanne Drilling, MD;  Location: [facility];  Service: Orthopedics;  Laterality: Left;  . Back surgery      MEDICATIONS: Current Outpatient Prescriptions on File Prior to Visit  Medication Sig Dispense Refill  . amLODipine (NORVASC) 5 MG tablet TAKE ONE TABLET BY MOUTH DAILY  90 tablet  3  . atorvastatin (LIPITOR) 20 MG tablet TAKE ONE TABLET BY MOUTH DAILY  30 tablet  3  . citalopram (CELEXA) 20 MG tablet Take 1 tablet (20 mg total) by mouth daily.  30 tablet  3  . clotrimazole-betamethasone (LOTRISONE) cream Apply 1 application topically 2 (two) times daily. For psoriasis.      . Cyanocobalamin (NASCOBAL) 500 MCG/0.1ML SOLN Place 1 mL (5,000 mcg total) into the nose once a week.  1.3 mL  12  . esomeprazole (NEXIUM) 40 MG capsule Take 1 capsule (40 mg total) by mouth 2 (two) times daily.  60 capsule  3  . estazolam (PROSOM) 2 MG tablet TAKE ONE TABLET BY MOUTH AT BEDTIME AS NEEDED FOR INSOMNIA  30 tablet  5  . iron polysaccharides (NIFEREX) 150 MG capsule Take 150 mg by mouth 2 (two) times daily.      . methotrexate (RHEUMATREX) 2.5 MG tablet TAKE FOUR TABLETS BY MOUTH ONCE A WEEK.  16 tablet  10  . nabumetone (RELAFEN) 750 MG tablet  TAKE TWO TABLETS DAILY  60 tablet  5  . OXYBUTYNIN CHLORIDE PO Take 5 mg by mouth 2 (two) times daily.       . predniSONE (DELTASONE) 5 MG tablet TAKE 1 TABLET BY MOUTH ONCE A DAY  30 tablet  6  . rOPINIRole (REQUIP) 2 MG tablet TAKE ONE TABLET BY MOUTH DAILY  30 tablet  6  . rOPINIRole (REQUIP) 2 MG tablet TAKE ONE TABLET BY MOUTH DAILY  30 tablet  6  . triamcinolone cream (KENALOG) 0.1 % APPLY TO AFFECTED AREAS AS DIRECTED  480 g  3  . ZETIA 10 MG tablet TAKE ONE TABLET BY MOUTH DAILY  30 tablet  7  . diclofenac sodium (VOLTAREN) 1 % GEL Apply 1 application topically 2 (two) times daily as needed. Arthritis in both thumbs.       No current facility-administered medications on file prior to visit.    ALLERGIES: Allergies  Allergen Reactions  . Ciprofloxacin Nausea Only    REACTION: GI upset  . Other     SCOLLOPS--SUDDEN GI ATTACK    FAMILY HISTORY: Family History  Problem Relation Age of Onset  . Stroke Mother   . Early death Father     SOCIAL HISTORY: History   Social History  . Marital Status: Married    Spouse Name: N/A    Number of Children: N/A  . Years of Education: N/A   Occupational History  . retired    Social History Main Topics  . Smoking status: Former Smoker -- 0.10 packs/day for 55 years    Types: Cigarettes  . Smokeless tobacco: Never Used     Comment: pt statest he smokes about "once a month"   . Alcohol Use: Yes     Comment: RARE  . Drug Use: No  . Sexual Activity: Yes   Other Topics Concern  . Not on file   Social History Narrative  . No narrative on file    REVIEW OF SYSTEMS: Constitutional: No fevers, chills, or sweats, no generalized fatigue, change in appetite Eyes: No visual changes, double vision, eye pain Ear, nose and throat: No hearing loss, ear pain, nasal congestion, sore throat Cardiovascular: No chest pain, palpitations Respiratory:  No shortness of breath at rest or with exertion, wheezes GastrointestinaI: No nausea,  vomiting, diarrhea, abdominal pain, fecal incontinence Genitourinary:  No dysuria, urinary retention or frequency Musculoskeletal:  No neck pain, back pain Integumentary: No rash, pruritus, skin lesions Neurological: as above Psychiatric: No depression, insomnia, anxiety Endocrine: No palpitations, fatigue, diaphoresis, mood swings, change in appetite, change in weight, increased thirst Hematologic/Lymphatic:  No anemia, purpura, petechiae. Allergic/Immunologic: no itchy/runny eyes, nasal congestion, recent allergic reactions, rashes  PHYSICAL EXAM: Filed Vitals:   02/22/13 0753  BP: 150/70  Pulse: 80  Temp: 97.7 F (36.5 C)   General: No acute distress Head:  Normocephalic/atraumatic Neck: supple, no paraspinal tenderness, full range of motion Back: No paraspinal tenderness Heart: regular rate and rhythm Lungs: Clear to auscultation bilaterally. Vascular: No carotid bruits. Neurological Exam: Mental status: alert and oriented to person, place, and time, speech fluent and not dysarthric, language intact.  Able to perform trail-making test and copy a cube.  When drawing a clock, he placed the numbers outside the circle, but contour, order of numbers, and placement of hands to requested time were correct.  Able to perform serial 7 subtraction, attention good, fluency good, abstraction good.  Recalled 3 of 5 words.  MOCA 27/30. Cranial nerves: CN I: not tested CN II: pupils equal, round and reactive to light, visual fields intact, fundi unremarkable. CN III, IV, VI:  full range of motion, no nystagmus, no ptosis CN V: facial sensation intact CN VII: upper and lower face symmetric CN VIII: hearing intact CN IX, X: gag intact, uvula midline CN XI: sternocleidomastoid and trapezius muscles intact CN XII: tongue midline Bulk & Tone: normal, no fasciculations. Motor: 5/5 throughout, no bradykinesia or rigidity. Sensation: reduced temperature and vibration in feet  Deep Tendon Reflexes:  1+ throughout, except absent in left patellar, toes down Finger to nose testing: fine postural and kinetic tremor noted in both hands.  No resting tremor or dysmetria noted. Gait: normal stride, a little limp, able to turn around in a couple of steps, no ataxia or shuffling, able to walk in tandem. Romberg negative.  IMPRESSION & PLAN: 1.  Memory problems: despite missing a couple of words, memory fairly intact and MOCA performance good.  I don't suspect anything neurodegenerative at this point, especially since history suggests improvement since starting the B12.  Anxiety likely playing a role.   2.  Tremor:  Benign essential tremor vs medication effect from Celexa.  Since it seems to have started after Celexa, may be medication effect.  However, the symptoms are very mild and the Celexa seems to be helping, so I wouldn't change the Celexa at this time. 3.  Lethargy:  Improved with discontinuation of sleeping pill.  At this point, I would re-evaluate in about 10 months.  Continue B12 supplementation and addressing depression.   If things are stable or improved, likely related to B12 deficiency and depression.  If worse, consider neuropsychological testing.  He is seeing his cardiologist, Dr. Ladona Ridgel, in about 5 days.  Discuss potential of prolonged QT interval in setting of Celexa and Vesicare in a 79 year old patient with atrial fibrillation.  Would likely need ECG as baseline.  60 minutes spent with patient and wife, over 50% spent counseling and coordinating care.  Thank you for allowing me to take part in the care of this patient.  Shon Millet, DO  CC:  Darryll Capers, MD  Lewayne Bunting, MD

## 2013-02-22 NOTE — Patient Instructions (Signed)
I think the memory problems at this point are likely related to depression and still effects of B12 deficiency, especially since you noticed improvement since starting the shots.  What's reassuring is your cognitive test was okay.  The extreme fatigue and lethargy was likely related to the sleeping pill.  The tremor may be due to the Celexa or it may simply be an essential tremor.  I don't think it is anything serious.  I wouldn't make any changes with the Celexa at this point, since it helps with your depression and the tremor is so mild.  We should still re-evaluate your memory down the line (10 months) to see if any further testing needs to be pursued.

## 2013-02-27 ENCOUNTER — Encounter: Payer: Medicare Other | Admitting: *Deleted

## 2013-03-06 ENCOUNTER — Ambulatory Visit (INDEPENDENT_AMBULATORY_CARE_PROVIDER_SITE_OTHER): Payer: Medicare Other | Admitting: *Deleted

## 2013-03-06 DIAGNOSIS — D518 Other vitamin B12 deficiency anemias: Secondary | ICD-10-CM

## 2013-03-06 DIAGNOSIS — D519 Vitamin B12 deficiency anemia, unspecified: Secondary | ICD-10-CM

## 2013-03-06 MED ORDER — CYANOCOBALAMIN 1000 MCG/ML IJ SOLN
1000.0000 ug | INTRAMUSCULAR | Status: AC
  Administered 2013-03-06: 1000 ug via INTRAMUSCULAR

## 2013-03-16 ENCOUNTER — Ambulatory Visit: Payer: Medicare Other | Admitting: *Deleted

## 2013-03-21 ENCOUNTER — Other Ambulatory Visit: Payer: Self-pay | Admitting: Internal Medicine

## 2013-03-30 ENCOUNTER — Other Ambulatory Visit: Payer: Medicare Other

## 2013-04-03 ENCOUNTER — Emergency Department (HOSPITAL_COMMUNITY): Payer: Medicare Other

## 2013-04-03 ENCOUNTER — Encounter (HOSPITAL_COMMUNITY): Payer: Self-pay | Admitting: Emergency Medicine

## 2013-04-03 ENCOUNTER — Inpatient Hospital Stay (HOSPITAL_COMMUNITY)
Admission: EM | Admit: 2013-04-03 | Discharge: 2013-04-06 | DRG: 065 | Disposition: A | Discharge status: Rehabilitation Facility | Payer: Medicare Other | Attending: Family Medicine | Admitting: Family Medicine

## 2013-04-03 ENCOUNTER — Observation Stay (HOSPITAL_COMMUNITY): Payer: Medicare Other

## 2013-04-03 ENCOUNTER — Ambulatory Visit: Payer: Medicare Other | Admitting: Internal Medicine

## 2013-04-03 ENCOUNTER — Ambulatory Visit: Payer: Medicare Other

## 2013-04-03 DIAGNOSIS — Z8546 Personal history of malignant neoplasm of prostate: Secondary | ICD-10-CM

## 2013-04-03 DIAGNOSIS — M818 Other osteoporosis without current pathological fracture: Secondary | ICD-10-CM

## 2013-04-03 DIAGNOSIS — G8190 Hemiplegia, unspecified affecting unspecified side: Secondary | ICD-10-CM | POA: Diagnosis present

## 2013-04-03 DIAGNOSIS — M25562 Pain in left knee: Secondary | ICD-10-CM

## 2013-04-03 DIAGNOSIS — R7309 Other abnormal glucose: Secondary | ICD-10-CM

## 2013-04-03 DIAGNOSIS — I4891 Unspecified atrial fibrillation: Secondary | ICD-10-CM

## 2013-04-03 DIAGNOSIS — I482 Chronic atrial fibrillation, unspecified: Secondary | ICD-10-CM | POA: Diagnosis present

## 2013-04-03 DIAGNOSIS — Z8673 Personal history of transient ischemic attack (TIA), and cerebral infarction without residual deficits: Secondary | ICD-10-CM | POA: Diagnosis present

## 2013-04-03 DIAGNOSIS — D508 Other iron deficiency anemias: Secondary | ICD-10-CM

## 2013-04-03 DIAGNOSIS — I2510 Atherosclerotic heart disease of native coronary artery without angina pectoris: Secondary | ICD-10-CM | POA: Diagnosis present

## 2013-04-03 DIAGNOSIS — Z950 Presence of cardiac pacemaker: Secondary | ICD-10-CM

## 2013-04-03 DIAGNOSIS — H539 Unspecified visual disturbance: Secondary | ICD-10-CM

## 2013-04-03 DIAGNOSIS — Z96659 Presence of unspecified artificial knee joint: Secondary | ICD-10-CM

## 2013-04-03 DIAGNOSIS — I517 Cardiomegaly: Secondary | ICD-10-CM

## 2013-04-03 DIAGNOSIS — R609 Edema, unspecified: Secondary | ICD-10-CM

## 2013-04-03 DIAGNOSIS — L4050 Arthropathic psoriasis, unspecified: Secondary | ICD-10-CM | POA: Diagnosis present

## 2013-04-03 DIAGNOSIS — I639 Cerebral infarction, unspecified: Secondary | ICD-10-CM

## 2013-04-03 DIAGNOSIS — L408 Other psoriasis: Secondary | ICD-10-CM

## 2013-04-03 DIAGNOSIS — I6350 Cerebral infarction due to unspecified occlusion or stenosis of unspecified cerebral artery: Secondary | ICD-10-CM

## 2013-04-03 DIAGNOSIS — B368 Other specified superficial mycoses: Secondary | ICD-10-CM

## 2013-04-03 DIAGNOSIS — Z87891 Personal history of nicotine dependence: Secondary | ICD-10-CM

## 2013-04-03 DIAGNOSIS — K219 Gastro-esophageal reflux disease without esophagitis: Secondary | ICD-10-CM

## 2013-04-03 DIAGNOSIS — L02419 Cutaneous abscess of limb, unspecified: Secondary | ICD-10-CM

## 2013-04-03 DIAGNOSIS — Z79899 Other long term (current) drug therapy: Secondary | ICD-10-CM

## 2013-04-03 DIAGNOSIS — R29810 Facial weakness: Secondary | ICD-10-CM | POA: Diagnosis present

## 2013-04-03 DIAGNOSIS — K8019 Calculus of gallbladder with other cholecystitis with obstruction: Secondary | ICD-10-CM

## 2013-04-03 DIAGNOSIS — I808 Phlebitis and thrombophlebitis of other sites: Secondary | ICD-10-CM

## 2013-04-03 DIAGNOSIS — M1710 Unilateral primary osteoarthritis, unspecified knee: Secondary | ICD-10-CM

## 2013-04-03 DIAGNOSIS — R141 Gas pain: Secondary | ICD-10-CM

## 2013-04-03 DIAGNOSIS — M129 Arthropathy, unspecified: Secondary | ICD-10-CM | POA: Diagnosis present

## 2013-04-03 DIAGNOSIS — R55 Syncope and collapse: Secondary | ICD-10-CM

## 2013-04-03 DIAGNOSIS — I10 Essential (primary) hypertension: Secondary | ICD-10-CM

## 2013-04-03 DIAGNOSIS — J45901 Unspecified asthma with (acute) exacerbation: Secondary | ICD-10-CM | POA: Diagnosis present

## 2013-04-03 DIAGNOSIS — G4733 Obstructive sleep apnea (adult) (pediatric): Secondary | ICD-10-CM | POA: Diagnosis present

## 2013-04-03 DIAGNOSIS — I6340 Cerebral infarction due to embolism of unspecified cerebral artery: Principal | ICD-10-CM | POA: Diagnosis present

## 2013-04-03 DIAGNOSIS — R319 Hematuria, unspecified: Secondary | ICD-10-CM

## 2013-04-03 DIAGNOSIS — R51 Headache: Secondary | ICD-10-CM

## 2013-04-03 DIAGNOSIS — Q762 Congenital spondylolisthesis: Secondary | ICD-10-CM

## 2013-04-03 DIAGNOSIS — G4700 Insomnia, unspecified: Secondary | ICD-10-CM

## 2013-04-03 DIAGNOSIS — I6529 Occlusion and stenosis of unspecified carotid artery: Secondary | ICD-10-CM | POA: Diagnosis present

## 2013-04-03 DIAGNOSIS — I658 Occlusion and stenosis of other precerebral arteries: Secondary | ICD-10-CM | POA: Diagnosis present

## 2013-04-03 DIAGNOSIS — E785 Hyperlipidemia, unspecified: Secondary | ICD-10-CM

## 2013-04-03 HISTORY — DX: Cerebral infarction, unspecified: I63.9

## 2013-04-03 LAB — COMPREHENSIVE METABOLIC PANEL
BUN: 18 mg/dL (ref 6–23)
CO2: 27 mEq/L (ref 19–32)
Calcium: 9 mg/dL (ref 8.4–10.5)
Creatinine, Ser: 1.11 mg/dL (ref 0.50–1.35)
GFR calc Af Amer: 72 mL/min — ABNORMAL LOW (ref >90)
GFR calc non Af Amer: 62 mL/min — ABNORMAL LOW (ref >90)
Glucose, Bld: 106 mg/dL — ABNORMAL HIGH (ref 70–99)
Sodium: 141 mEq/L (ref 135–145)
Total Protein: 6.1 g/dL (ref 6.0–8.3)

## 2013-04-03 LAB — RAPID URINE DRUG SCREEN, HOSP PERFORMED
Benzodiazepines: NOT DETECTED
Cocaine: NOT DETECTED
Opiates: NOT DETECTED
Tetrahydrocannabinol: NOT DETECTED

## 2013-04-03 LAB — POCT I-STAT, CHEM 8
BUN: 21 mg/dL (ref 6–23)
Calcium, Ion: 1.22 mmol/L (ref 1.13–1.30)
Creatinine, Ser: 1.3 mg/dL (ref 0.50–1.35)
Glucose, Bld: 104 mg/dL — ABNORMAL HIGH (ref 70–99)
Hemoglobin: 15.3 g/dL (ref 13.0–17.0)
Potassium: 4.1 mEq/L (ref 3.5–5.1)
TCO2: 27 mmol/L (ref 0–100)

## 2013-04-03 LAB — URINALYSIS, ROUTINE W REFLEX MICROSCOPIC
Bilirubin Urine: NEGATIVE
Glucose, UA: NEGATIVE mg/dL
Ketones, ur: NEGATIVE mg/dL
Leukocytes, UA: NEGATIVE
Nitrite: NEGATIVE
Specific Gravity, Urine: 1.015 (ref 1.005–1.030)
Urobilinogen, UA: 0.2 mg/dL (ref 0.0–1.0)
pH: 6.5 (ref 5.0–8.0)

## 2013-04-03 LAB — TSH: TSH: 2.864 u[IU]/mL (ref 0.350–4.500)

## 2013-04-03 LAB — DIFFERENTIAL
Basophils Absolute: 0 10*3/uL (ref 0.0–0.1)
Basophils Relative: 1 % (ref 0–1)
Eosinophils Absolute: 0.1 10*3/uL (ref 0.0–0.7)
Eosinophils Relative: 2 % (ref 0–5)
Lymphocytes Relative: 9 % — ABNORMAL LOW (ref 12–46)
Lymphs Abs: 0.7 10*3/uL (ref 0.7–4.0)
Monocytes Absolute: 1.1 10*3/uL — ABNORMAL HIGH (ref 0.1–1.0)
Monocytes Relative: 14 % — ABNORMAL HIGH (ref 3–12)

## 2013-04-03 LAB — CBC
HCT: 43.6 % (ref 39.0–52.0)
MCH: 32.8 pg (ref 26.0–34.0)
MCV: 94 fL (ref 78.0–100.0)
RDW: 13.8 % (ref 11.5–15.5)
WBC: 7.9 10*3/uL (ref 4.0–10.5)

## 2013-04-03 LAB — POCT I-STAT TROPONIN I: Troponin i, poc: 0 ng/mL (ref 0.00–0.08)

## 2013-04-03 LAB — ETHANOL: Alcohol, Ethyl (B): <11 mg/dL (ref 0–11)

## 2013-04-03 LAB — VITAMIN B12: Vitamin B-12: 509 pg/mL (ref 211–911)

## 2013-04-03 LAB — HEMOGLOBIN A1C: Hgb A1c MFr Bld: 5.8 % — ABNORMAL HIGH (ref <5.7)

## 2013-04-03 LAB — GLUCOSE, CAPILLARY: Glucose-Capillary: 104 mg/dL — ABNORMAL HIGH (ref 70–99)

## 2013-04-03 MED ORDER — DICLOFENAC SODIUM 1 % TD GEL: 1.0000 "application " | Freq: Two times a day (BID) | TRANSDERMAL | Status: DC | PRN

## 2013-04-03 MED ORDER — PANTOPRAZOLE SODIUM 40 MG PO TBEC
40.0000 mg | DELAYED_RELEASE_TABLET | Freq: Two times a day (BID) | ORAL | Status: DC
  Administered 2013-04-03 – 2013-04-06 (×7): 40 mg via ORAL
  Filled 2013-04-03 (×7): qty 1

## 2013-04-03 MED ORDER — ASPIRIN 300 MG RE SUPP
300.0000 mg | Freq: Every day | RECTAL | Status: DC
  Filled 2013-04-03 (×2): qty 1

## 2013-04-03 MED ORDER — CITALOPRAM HYDROBROMIDE 20 MG PO TABS
20.0000 mg | ORAL_TABLET | Freq: Every day | ORAL | Status: DC
  Administered 2013-04-03 – 2013-04-06 (×4): 20 mg via ORAL
  Filled 2013-04-03 (×4): qty 1

## 2013-04-03 MED ORDER — HEPARIN SODIUM (PORCINE) 5000 UNIT/ML IJ SOLN
5000.0000 [IU] | Freq: Three times a day (TID) | INTRAMUSCULAR | Status: DC
  Administered 2013-04-03 – 2013-04-04 (×3): 5000 [IU] via SUBCUTANEOUS
  Filled 2013-04-03 (×6): qty 1

## 2013-04-03 MED ORDER — SODIUM CHLORIDE 0.9 % IV SOLN: INTRAVENOUS | Status: DC

## 2013-04-03 MED ORDER — DARIFENACIN HYDROBROMIDE ER 7.5 MG PO TB24
7.5000 mg | ORAL_TABLET | Freq: Every day | ORAL | Status: DC
  Administered 2013-04-03 – 2013-04-06 (×4): 7.5 mg via ORAL
  Filled 2013-04-03 (×4): qty 1

## 2013-04-03 MED ORDER — ASPIRIN 300 MG RE SUPP: 300.0000 mg | Freq: Every day | RECTAL | Status: DC

## 2013-04-03 MED ORDER — EZETIMIBE 10 MG PO TABS
10.0000 mg | ORAL_TABLET | Freq: Every day | ORAL | Status: DC
  Administered 2013-04-03 – 2013-04-06 (×4): 10 mg via ORAL
  Filled 2013-04-03 (×4): qty 1

## 2013-04-03 MED ORDER — SODIUM CHLORIDE 0.9 % IV SOLN
INTRAVENOUS | Status: AC
  Administered 2013-04-03: 14:00:00 via INTRAVENOUS

## 2013-04-03 MED ORDER — ASPIRIN 325 MG PO TABS
325.0000 mg | ORAL_TABLET | Freq: Every day | ORAL | Status: DC
  Administered 2013-04-03 – 2013-04-04 (×2): 325 mg via ORAL
  Filled 2013-04-03 (×2): qty 1

## 2013-04-03 MED ORDER — INFLUENZA VAC SPLIT QUAD 0.5 ML IM SUSP
0.5000 mL | INTRAMUSCULAR | Status: AC
  Administered 2013-04-04: 0.5 mL via INTRAMUSCULAR
  Filled 2013-04-03 (×3): qty 0.5

## 2013-04-03 MED ORDER — ATORVASTATIN CALCIUM 20 MG PO TABS
20.0000 mg | ORAL_TABLET | Freq: Every day | ORAL | Status: DC
  Administered 2013-04-03 – 2013-04-05 (×3): 20 mg via ORAL
  Filled 2013-04-03 (×5): qty 1

## 2013-04-03 MED ORDER — POLYSACCHARIDE IRON COMPLEX 150 MG PO CAPS
150.0000 mg | ORAL_CAPSULE | Freq: Two times a day (BID) | ORAL | Status: DC
  Administered 2013-04-03 – 2013-04-06 (×7): 150 mg via ORAL
  Filled 2013-04-03 (×9): qty 1

## 2013-04-03 MED ORDER — OXYBUTYNIN CHLORIDE 5 MG PO TABS
5.0000 mg | ORAL_TABLET | Freq: Two times a day (BID) | ORAL | Status: DC
  Administered 2013-04-03 – 2013-04-06 (×7): 5 mg via ORAL
  Filled 2013-04-03 (×9): qty 1

## 2013-04-03 MED ORDER — AMLODIPINE BESYLATE 5 MG PO TABS
5.0000 mg | ORAL_TABLET | Freq: Every day | ORAL | Status: DC
  Administered 2013-04-03 – 2013-04-04 (×2): 5 mg via ORAL
  Filled 2013-04-03 (×2): qty 1

## 2013-04-03 MED ORDER — PREDNISONE 5 MG PO TABS
5.0000 mg | ORAL_TABLET | Freq: Every day | ORAL | Status: DC
  Administered 2013-04-03 – 2013-04-05 (×3): 5 mg via ORAL
  Filled 2013-04-03 (×6): qty 1

## 2013-04-03 MED ORDER — MIRABEGRON ER 50 MG PO TB24
50.0000 mg | ORAL_TABLET | Freq: Every day | ORAL | Status: DC
  Administered 2013-04-03 – 2013-04-06 (×4): 50 mg via ORAL
  Filled 2013-04-03 (×6): qty 1

## 2013-04-03 MED ORDER — ROPINIROLE HCL 1 MG PO TABS
2.0000 mg | ORAL_TABLET | Freq: Once | ORAL | Status: AC
  Administered 2013-04-03: 2 mg via ORAL
  Filled 2013-04-03: qty 2

## 2013-04-03 MED ORDER — ROPINIROLE HCL 1 MG PO TABS
2.0000 mg | ORAL_TABLET | Freq: Two times a day (BID) | ORAL | Status: DC
  Administered 2013-04-03 – 2013-04-06 (×6): 2 mg via ORAL
  Filled 2013-04-03 (×9): qty 2

## 2013-04-03 MED ORDER — ACETAMINOPHEN 325 MG PO TABS
650.0000 mg | ORAL_TABLET | ORAL | Status: DC | PRN
  Administered 2013-04-03 – 2013-04-06 (×4): 650 mg via ORAL
  Filled 2013-04-03 (×4): qty 2

## 2013-04-03 NOTE — ED Notes (Signed)
Per EMS: pt has left sided flaccid/weakness. Pt went to bed at 1030 normal. Pt woke up with left arm numbness and could not use left arm to open can of cat food. No speech changes. Pt has left sided facial droop. Pt alert and oriented. Pt denies pain. BP 162/90 HR 68 with hx afib RR 16 96% RA CBG 132.

## 2013-04-03 NOTE — ED Notes (Signed)
Report called to floor and given to Granger, Charity fundraiser. No further questions upon report given. Notified that MD would be asked for order for restless leg.

## 2013-04-03 NOTE — Progress Notes (Signed)
*  PRELIMINARY RESULTS* Vascular Ultrasound Carotid Duplex (Doppler) has been completed.   Bilateral internal carotid arteries demonstrate 40-59% internal carotid artery stenosis. Vertebral arteries are patent with antegrade flow.  04/03/2013 5:47 PM Gertie Fey, RVT, RDCS, RDMS

## 2013-04-03 NOTE — Progress Notes (Signed)
Call received from vascular lab technician Marcelino Duster  this evening that patient had a fall while assisted to bathroom there. Charge Nurse Vanna Scotland, RN went to assess patient who sustained abrasion to left fingers with gauze dressing applied. Dr. Duaine Dredge in the patient talking to family at the call of call made aware as well as pt's spouse and daughter.  Night Floor Coverage Merdis Delay, NP paged and and notify.

## 2013-04-03 NOTE — Progress Notes (Signed)
  Echocardiogram 2D Echocardiogram has been performed.  Arvil Chaco 04/03/2013, 3:35 PM

## 2013-04-03 NOTE — ED Provider Notes (Signed)
CSN: [id number]     Arrival date & time 04/03/13  0831 History   First MD Initiated Contact with Patient 04/03/13 [phone]     Chief Complaint  Patient presents with  . Stroke Symptoms   HPI Pt was seen at 0835. Per pt, c/o unknown onset and persistence of constant LUE weakness and numbness that he noticed when he woke up this morning approx 0630. Pt states he went to bed last night approx 2230 and was "ok." Pt states he woke up approx 0630 this morning, walked to the kitchen to open a can of cat food when he noticed "my left arm wasn't working right." Denies dysphagia, no slurred speech, no ataxia, no RUE or bilat LE's weakness/numbness, no CP/palpitations, no SOB/cough, no abd pain, no N/V/D, no headache, no neck or back pain.   Past Medical History  Diagnosis Date  . Hyperlipidemia   . HTN (hypertension)   . Status post placement of cardiac pacemaker DDD-  06-04-10-- LAST CHECK 09-08-10 IN EPIC    SECONDARY TO SYNCOPY AND BRADYCARDIA  . Restless leg syndrome   . Normal nuclear stress test 2008    LOW RISK--   DR  WALL  . Carotid stenosis, bilateral PER DR EARLY / DUPLEX  09-09-10      40 - 59%   BILATERALLY--- ASYMPTOMATIC  . History of echocardiogram 11-21-2006    EF 65%  . History of prostate cancer S/P RADIATION  TX  6 YRS AGO  . Psoriasis   . Arthritis with psoriasis   . GERD (gastroesophageal reflux disease)     CONTROLLED W/ PROTONIX  . Borderline diabetes     DIET CONTROLLED - PT STATES HE IS NOT DIABETIC  . Lumbar spondylosis W/ RADICULOPATHY  . Iron deficiency   . Cataract immature LEFT EYE  . PAF (paroxysmal atrial fibrillation)   . SVT (supraventricular tachycardia)     S/P ABLATION   2008  . Coronary artery disease CARDIOLOGIST- DR Sharrell Ku    09-08-10 VISIT AND PACEMAKER CHECK  IN EPIC  . Cancer     HX OF PROSTATE CANCER--TX'D WITH RADIATION  . Sleep apnea TESTED YRS AGO-- NO CPAP RX GIVEN    PT STATES HE DOES NOT THINK HE STILL HAS SLEEP APNEA--DOES NOT USE  CPAP   Past Surgical History  Procedure Laterality Date  . Replacement total knee  1997    RIGHT  . Cardiac pacemaker placement  06-04-10    DDD/ AND REMOVAL LOOR RECORDER  . Loop recorder placement  01-30-10  . Lumbar laminectomy/ diskectomy/ fusion  05-19-10    L4 - 5  . Cholecystectomy  2009  . Cardiac electrophysiology study and ablation  2008    FOR SVT  . Prostate biopsy  2007  . Transurethral resection of prostate  2006  . Inguinal hernia repair  2005    LEFT/   DONE WITH PENILE PROSTESIS SURG.  . Removal and placement penile prosthesis implant   2005    AND LEFT CORPOROPLASTY /    DONE INGUINAL REPAIR  . Penile prosthesis implant  1995  . Knee arthroscopy  06/02/2011    Procedure: ARTHROSCOPY KNEE;  Surgeon: Loanne Drilling;  Location: [facility];  Service: Orthopedics;  Laterality: Left;  LEFT KNEE ARTHROSCOPY WITH DEBRIDEMENT  . Total knee arthroplasty  12/20/2011    Procedure: TOTAL KNEE ARTHROPLASTY;  Surgeon: Loanne Drilling, MD;  Location: [facility];  Service: Orthopedics;  Laterality: Left;  . Back  surgery     Family History  Problem Relation Age of Onset  . Stroke Mother   . Early death Father    History  Substance Use Topics  . Smoking status: Former Smoker -- 0.10 packs/day for 55 years    Types: Cigarettes  . Smokeless tobacco: Never Used     Comment: pt statest he smokes about "once a month"   . Alcohol Use: Yes     Comment: RARE    Review of Systems ROS: Statement: All systems negative except as marked or noted in the HPI; Constitutional: Negative for fever and chills. ; ; Eyes: Negative for eye pain, redness and discharge. ; ; ENMT: Negative for ear pain, hoarseness, nasal congestion, sinus pressure and sore throat. ; ; Cardiovascular: Negative for chest pain, palpitations, diaphoresis, dyspnea and peripheral edema. ; ; Respiratory: Negative for cough, wheezing and stridor. ; ; Gastrointestinal: Negative for nausea, vomiting, diarrhea,  abdominal pain, blood in stool, hematemesis, jaundice and rectal bleeding. . ; ; Genitourinary: Negative for dysuria, flank pain and hematuria. ; ; Musculoskeletal: Negative for back pain and neck pain. Negative for swelling and trauma.; ; Skin: Negative for pruritus, rash, abrasions, blisters, bruising and skin lesion.; ; Neuro: +LUE weakness and numbness. Negative for headache, lightheadedness and neck stiffness. Negative for weakness, altered level of consciousness , altered mental status, involuntary movement, seizure and syncope.      Allergies  Ciprofloxacin and Other  Home Medications   Current Outpatient Rx  Name  Route  Sig  Dispense  Refill  . amLODipine (NORVASC) 5 MG tablet      TAKE ONE TABLET BY MOUTH DAILY   90 tablet   3     CYCLE FILL MEDICATION. Authorization is required f ...   . amLODipine (NORVASC) 5 MG tablet      TAKE ONE TABLET BY MOUTH DAILY   90 tablet   2     CYCLE FILL MEDICATION. Authorization is required f ...   . atorvastatin (LIPITOR) 20 MG tablet      TAKE ONE TABLET BY MOUTH DAILY   30 tablet   3     CYCLE FILL MEDICATION. Authorization is required f ...   . citalopram (CELEXA) 20 MG tablet   Oral   Take 1 tablet (20 mg total) by mouth daily.   30 tablet   3   . clotrimazole-betamethasone (LOTRISONE) cream   Topical   Apply 1 application topically 2 (two) times daily. For psoriasis.         . Cyanocobalamin (NASCOBAL) 500 MCG/0.1ML SOLN   Nasal   Place 1 mL (5,000 mcg total) into the nose once a week.   1.3 mL   12   . diclofenac sodium (VOLTAREN) 1 % GEL   Topical   Apply 1 application topically 2 (two) times daily as needed. Arthritis in both thumbs.         . esomeprazole (NEXIUM) 40 MG capsule   Oral   Take 1 capsule (40 mg total) by mouth 2 (two) times daily.   60 capsule   3   . estazolam (PROSOM) 2 MG tablet      TAKE ONE TABLET BY MOUTH AT BEDTIME AS NEEDED FOR INSOMNIA   30 tablet   5     Rx for  controlled drug has expired - no refills re ...   . iron polysaccharides (NIFEREX) 150 MG capsule   Oral   Take 150 mg by mouth 2 (two)  times daily.         . methotrexate (RHEUMATREX) 2.5 MG tablet      TAKE FOUR TABLETS BY MOUTH ONCE A WEEK.   16 tablet   10     No refills available   . MYRBETRIQ 50 MG TB24 tablet               . nabumetone (RELAFEN) 750 MG tablet      TAKE TWO TABLETS DAILY   60 tablet   5     CYCLE FILL MEDICATION. Authorization is required f ...   . OXYBUTYNIN CHLORIDE PO   Oral   Take 5 mg by mouth 2 (two) times daily.          . pantoprazole (PROTONIX) 40 MG tablet               . predniSONE (DELTASONE) 5 MG tablet      TAKE 1 TABLET BY MOUTH ONCE A DAY   30 tablet   6     CYCLE FILL MEDICATION. Authorization is required f ...   . rOPINIRole (REQUIP) 2 MG tablet      TAKE ONE TABLET BY MOUTH DAILY   30 tablet   6     CYCLE FILL MEDICATION. Authorization is required f ...   . rOPINIRole (REQUIP) 2 MG tablet      TAKE ONE TABLET BY MOUTH DAILY   30 tablet   6     No refills available   . triamcinolone cream (KENALOG) 0.1 %      APPLY TO AFFECTED AREAS AS DIRECTED   480 g   3     CYCLE FILL MEDICATION. Authorization is required f ...   . VESICARE 10 MG tablet               . ZETIA 10 MG tablet      TAKE ONE TABLET BY MOUTH DAILY   30 tablet   7     CYCLE FILL MEDICATION. Authorization is required f ...    BP 151/66  Pulse 62  Temp(Src) 97.7 F (36.5 C) (Oral)  Resp 15  SpO2 98% Physical Exam 0840: Physical examination:  Nursing notes reviewed; Vital signs and O2 SAT reviewed;  Constitutional: Well developed, Well nourished, Well hydrated, In no acute distress; Head:  Normocephalic, atraumatic; Eyes: EOMI, PERRL, No scleral icterus; ENMT: Mouth and pharynx normal, Mucous membranes moist; Neck: Supple, Full range of motion, No lymphadenopathy; Cardiovascular: Regular rate and rhythm, No murmur, rub, or  gallop; Respiratory: Breath sounds clear & equal bilaterally, No rales, rhonchi, wheezes.  Speaking full sentences with ease, Normal respiratory effort/excursion; Chest: Nontender, Movement normal; Abdomen: Soft, Nontender, Nondistended, Normal bowel sounds; Genitourinary: No CVA tenderness; Extremities: Pulses normal, No tenderness, No edema, No calf edema or asymmetry.; Neuro: AA&Ox3, Major CN grossly intact.  Strength 5/5 RUE, RLE, LLE; 3/5 strength LUE. DTR 2/4 equal bilat UE's and LE's.  +decreased sensation LUE.  Normal cerebellar testing bilat UE's (finger-nose) and LE's (heel-shin).  Speech clear.  +left facial droop. +left sided visual deficits.; Skin: Color normal, Warm, Dry.   ED Course  Procedures   0840:  tPA in stroke considered, but not given due to the following:  Onset over 3-4.5 hours, pt woke up with symptoms.     EKG Interpretation     Ventricular Rate:  67 PR Interval:  197 QRS Duration: 95 QT Interval:  401 QTC Calculation: 424 R Axis:   7 Text  Interpretation:  Sinus rhythm Aberrant complex Borderline repolarization abnormality            MDM  MDM Reviewed: previous chart, nursing note and vitals Reviewed previous: labs and ECG Interpretation: labs, ECG and CT scan    Date: 04/03/2013  Rate: 67  Rhythm: normal sinus rhythm and premature ventricular contractions (PVC)  QRS Axis: normal  Intervals: normal  ST/T Wave abnormalities: nonspecific ST/T changes  Conduction Disutrbances:none  Narrative Interpretation:   Old EKG Reviewed: unchanged; no significant changes from previous EKG dated 12/13/2011.     Results for orders placed during the hospital encounter of 04/03/13  ETHANOL      Result Value Range   Alcohol, Ethyl (B) <11  0 - 11 mg/dL  PROTIME-INR      Result Value Range   Prothrombin Time 12.5  11.6 - 15.2 seconds   INR 0.95  0.00 - 1.49  APTT      Result Value Range   aPTT 23 (*) 24 - 37 seconds  CBC      Result Value Range   WBC  7.9  4.0 - 10.5 K/uL   RBC 4.64  4.22 - 5.81 MIL/uL   Hemoglobin 15.2  13.0 - 17.0 g/dL   HCT 09.8  11.9 - 14.7 %   MCV 94.0  78.0 - 100.0 fL   MCH 32.8  26.0 - 34.0 pg   MCHC 34.9  30.0 - 36.0 g/dL   RDW 82.9  56.2 - 13.0 %   Platelets 198  150 - 400 K/uL  DIFFERENTIAL      Result Value Range   Neutrophils Relative % 75  43 - 77 %   Neutro Abs 5.9  1.7 - 7.7 K/uL   Lymphocytes Relative 9 (*) 12 - 46 %   Lymphs Abs 0.7  0.7 - 4.0 K/uL   Monocytes Relative 14 (*) 3 - 12 %   Monocytes Absolute 1.1 (*) 0.1 - 1.0 K/uL   Eosinophils Relative 2  0 - 5 %   Eosinophils Absolute 0.1  0.0 - 0.7 K/uL   Basophils Relative 1  0 - 1 %   Basophils Absolute 0.0  0.0 - 0.1 K/uL  COMPREHENSIVE METABOLIC PANEL      Result Value Range   Sodium 141  135 - 145 mEq/L   Potassium 3.9  3.5 - 5.1 mEq/L   Chloride 106  96 - 112 mEq/L   CO2 27  19 - 32 mEq/L   Glucose, Bld 106 (*) 70 - 99 mg/dL   BUN 18  6 - 23 mg/dL   Creatinine, Ser 8.65  0.50 - 1.35 mg/dL   Calcium 9.0  8.4 - 78.4 mg/dL   Total Protein 6.1  6.0 - 8.3 g/dL   Albumin 3.5  3.5 - 5.2 g/dL   AST 23  0 - 37 U/L   ALT 27  0 - 53 U/L   Alkaline Phosphatase 87  39 - 117 U/L   Total Bilirubin 0.2 (*) 0.3 - 1.2 mg/dL   GFR calc non Af Amer 62 (*) >90 mL/min   GFR calc Af Amer 72 (*) >90 mL/min  URINE RAPID DRUG SCREEN (HOSP PERFORMED)      Result Value Range   Opiates NONE DETECTED  NONE DETECTED   Cocaine NONE DETECTED  NONE DETECTED   Benzodiazepines NONE DETECTED  NONE DETECTED   Amphetamines NONE DETECTED  NONE DETECTED   Tetrahydrocannabinol NONE DETECTED  NONE DETECTED   Barbiturates  NONE DETECTED  NONE DETECTED  URINALYSIS, ROUTINE W REFLEX MICROSCOPIC      Result Value Range   Color, Urine YELLOW  YELLOW   APPearance CLEAR  CLEAR   Specific Gravity, Urine 1.015  1.005 - 1.030   pH 6.5  5.0 - 8.0   Glucose, UA NEGATIVE  NEGATIVE mg/dL   Hgb urine dipstick NEGATIVE  NEGATIVE   Bilirubin Urine NEGATIVE  NEGATIVE   Ketones,  ur NEGATIVE  NEGATIVE mg/dL   Protein, ur NEGATIVE  NEGATIVE mg/dL   Urobilinogen, UA 0.2  0.0 - 1.0 mg/dL   Nitrite NEGATIVE  NEGATIVE   Leukocytes, UA NEGATIVE  NEGATIVE  GLUCOSE, CAPILLARY      Result Value Range   Glucose-Capillary 104 (*) 70 - 99 mg/dL   Ct Head Wo Contrast 16/03/9603   CLINICAL DATA:  Left-sided weakness.  EXAM: CT HEAD WITHOUT CONTRAST  TECHNIQUE: Contiguous axial images were obtained from the base of the skull through the vertex without intravenous contrast.  COMPARISON:  09/10/2010 head CT.  FINDINGS: No intracranial hemorrhage.  Small vessel disease type changes without CT evidence of large acute infarct. Streak artifact limits evaluation of the posterior fossa.  No hydrocephalus.  No intracranial mass lesion noted on this unenhanced exam.  Vascular calcifications. Minimal to mild paranasal sinus mucosal thickening.  Mild exophthalmos.  IMPRESSION: No intracranial hemorrhage.  Small vessel disease type changes without CT evidence of large acute infarct. Streak artifact limits evaluation of the posterior fossa.   Electronically Signed   By: Bridgett Larsson M.D.   On: 04/03/2013 09:39    1015:  CT-H negative for acute CVA, unable to obtain MRI due to pacemaker. Dx and testing d/w pt.  Questions answered.  Verb understanding, agreeable to admit.  T/C to Neuro Dr. Leroy Kennedy, case discussed, including:  HPI, pertinent PM/SHx, VS/PE, dx testing, ED course and treatment:  Agreeable to consult, requests to admit to medicine service. T/C to [facility] Dr. Gwenlyn Perking, case discussed, including:  HPI, pertinent PM/SHx, VS/PE, dx testing, ED course and treatment:  Agreeable to admit, requests to write temporary orders, obtain tele bed to team [id number].    Laray Anger, DO 04/04/13 2153

## 2013-04-03 NOTE — Consult Note (Addendum)
Referring Physician: ED    Chief Complaint: left hemiparesis-numbness, left face weakness  HPI:                                                                                                                                         Christopher Baker is an 79 y.o. male, right handed, with a past medical history significant for HTN, hyperlipidemia, atrial fibrilation s/p pacemaker placement, bilateral carotid stenosis (80% according to wife), brought to [facility] ED for evaluation of new onset above stated symptoms. He said that he went to bed last night feeling well, but when she tried to get up the bed around 630 this morning " something was wrong with my left side, kind of numb and weak" and when he finally got out the bed went to the kitchen and tried to open a can of cat food just to realized that " my hand was not really working well". He tells me that he fell and had to struggle to get up. Furthermore, he said that around 7 or 730 am his wife noticed that the left side of his face was droopier than the right side. Never had similar symptoms before. No HA, vertigo, double vision, difficulty swallowing, slurred speech, language or vision impairment. CT brain showed no acute abnormality.     Date last known well: 04/03/13  Time last known well: unclear  tPA Given: no, late presentation NIHSS: 4 MRS: 3  Past Medical History  Diagnosis Date  . Hyperlipidemia   . HTN (hypertension)   . Status post placement of cardiac pacemaker DDD-  06-04-10-- LAST CHECK 09-08-10 IN EPIC    SECONDARY TO SYNCOPY AND BRADYCARDIA  . Restless leg syndrome   . Normal nuclear stress test 2008    LOW RISK--   DR  WALL  . Carotid stenosis, bilateral PER DR EARLY / DUPLEX  09-09-10      40 - 59%   BILATERALLY--- ASYMPTOMATIC  . History of echocardiogram 11-21-2006    EF 65%  . History of prostate cancer S/P RADIATION  TX  6 YRS AGO  . Psoriasis   . Arthritis with psoriasis   . GERD (gastroesophageal reflux disease)      CONTROLLED W/ PROTONIX  . Borderline diabetes     DIET CONTROLLED - PT STATES HE IS NOT DIABETIC  . Lumbar spondylosis W/ RADICULOPATHY  . Iron deficiency   . Cataract immature LEFT EYE  . PAF (paroxysmal atrial fibrillation)   . SVT (supraventricular tachycardia)     S/P ABLATION   2008  . Coronary artery disease CARDIOLOGIST- DR Sharrell Ku    09-08-10 VISIT AND PACEMAKER CHECK  IN EPIC  . Cancer     HX OF PROSTATE CANCER--TX'D WITH RADIATION  . Sleep apnea TESTED YRS AGO-- NO CPAP RX GIVEN    PT STATES HE DOES NOT THINK HE STILL HAS SLEEP APNEA--DOES  NOT USE CPAP    Past Surgical History  Procedure Laterality Date  . Replacement total knee  1997    RIGHT  . Cardiac pacemaker placement  06-04-10    DDD/ AND REMOVAL LOOR RECORDER  . Loop recorder placement  01-30-10  . Lumbar laminectomy/ diskectomy/ fusion  05-19-10    L4 - 5  . Cholecystectomy  2009  . Cardiac electrophysiology study and ablation  2008    FOR SVT  . Prostate biopsy  2007  . Transurethral resection of prostate  2006  . Inguinal hernia repair  2005    LEFT/   DONE WITH PENILE PROSTESIS SURG.  . Removal and placement penile prosthesis implant   2005    AND LEFT CORPOROPLASTY /    DONE INGUINAL REPAIR  . Penile prosthesis implant  1995  . Knee arthroscopy  06/02/2011    Procedure: ARTHROSCOPY KNEE;  Surgeon: Loanne Drilling;  Location: [facility];  Service: Orthopedics;  Laterality: Left;  LEFT KNEE ARTHROSCOPY WITH DEBRIDEMENT  . Total knee arthroplasty  12/20/2011    Procedure: TOTAL KNEE ARTHROPLASTY;  Surgeon: Loanne Drilling, MD;  Location: [facility];  Service: Orthopedics;  Laterality: Left;  . Back surgery      Family History  Problem Relation Age of Onset  . Stroke Mother   . Early death Father    Social History:  reports that he has quit smoking. His smoking use included Cigarettes. He has a 5.5 pack-year smoking history. He has never used smokeless tobacco. He reports that he  drinks alcohol. He reports that he does not use illicit drugs.  Allergies:  Allergies  Allergen Reactions  . Ciprofloxacin Nausea Only    REACTION: GI upset  . Other     SCOLLOPS--SUDDEN GI ATTACK    Medications:                                                                                                                           I have reviewed the patient's current medications.  ROS:                                                                                                                                       History obtained from the patient and chart review  General ROS: negative for - chills, fatigue, fever, night sweats, weight gain or weight  loss Psychological ROS: negative for - behavioral disorder, hallucinations, memory difficulties, mood swings or suicidal ideation Ophthalmic ROS: negative for - blurry vision, double vision, eye pain or loss of vision ENT ROS: negative for - epistaxis, nasal discharge, oral lesions, sore throat, tinnitus or vertigo Allergy and Immunology ROS: negative for - hives or itchy/watery eyes Hematological and Lymphatic ROS: negative for - bleeding problems, bruising or swollen lymph nodes Endocrine ROS: negative for - galactorrhea, hair pattern changes, polydipsia/polyuria or temperature intolerance Respiratory ROS: negative for - cough, hemoptysis, shortness of breath or wheezing Cardiovascular ROS: negative for - chest pain, dyspnea on exertion, edema or irregular heartbeat Gastrointestinal ROS: negative for - abdominal pain, diarrhea, hematemesis, nausea/vomiting or stool incontinence Genito-Urinary ROS: negative for - dysuria, hematuria, incontinence or urinary frequency/urgency Musculoskeletal ROS: negative for - joint swelling Neurological ROS: as noted in HPI Dermatological ROS: negative for rash and skin lesion changes   Physical exam: pleasant male in no apparent distress. Blood pressure 160/71, pulse 78, temperature 97.9 F  (36.6 C), temperature source Oral, resp. rate 21, SpO2 95.00%. Head: normocephalic. Neck: supple, no bruits, no JVD. Cardiac: no murmurs. Lungs: clear. Abdomen: soft, no tender, no mass. Extremities: no edema.   Neurologic Examination:                                                                                                      Mental Status: Alert, oriented, thought content appropriate.  Speech fluent without evidence of aphasia.  Able to follow 3 step commands without difficulty. Cranial Nerves: II: Discs flat bilaterally; Visual fields grossly normal, pupils equal, round, reactive to light and accommodation III,IV, VI: ptosis not present, extra-ocular motions intact bilaterally V,VII: smile asymmetric with left face weakness, facial light touch sensation normal bilaterally VIII: hearing normal bilaterally IX,X: gag reflex present XI: bilateral shoulder shrug XII: midline tongue extension Motor: Significant for left HP, arm greater than leg Tone and bulk:normal tone throughout; no atrophy noted Sensory: Pinprick and light touch intact throughout, bilaterally Deep Tendon Reflexes:  Right: Upper Extremity   Left: Upper extremity   biceps (C-5 to C-6) 2/4   biceps (C-5 to C-6) 2/4 tricep (C7) 2/4    triceps (C7) 2/4 Brachioradialis (C6) 2/4  Brachioradialis (C6) 2/4  Lower Extremity Lower Extremity  quadriceps (L-2 to L-4) 2/4   quadriceps (L-2 to L-4) 2/4 Achilles (S1) 2/4   Achilles (S1) 2/4  Plantars: Right: downgoing   Left: downgoing Cerebellar: normal finger-to-nose, normal heel-to-shin test in the right. Can not perform left FTN due to weakness. Gait: No ataxia. CV: pulses palpable throughout    Results for orders placed during the hospital encounter of 04/03/13 (from the past 48 hour(s))  ETHANOL     Status: None   Collection Time    04/03/13  8:55 AM      Result Value Range   Alcohol, Ethyl (B) <11  0 - 11 mg/dL   Comment:            LOWEST  DETECTABLE LIMIT FOR     SERUM ALCOHOL IS 11 mg/dL  FOR MEDICAL PURPOSES ONLY  PROTIME-INR     Status: None   Collection Time    04/03/13  8:55 AM      Result Value Range   Prothrombin Time 12.5  11.6 - 15.2 seconds   INR 0.95  0.00 - 1.49  APTT     Status: Abnormal   Collection Time    04/03/13  8:55 AM      Result Value Range   aPTT 23 (*) 24 - 37 seconds  CBC     Status: None   Collection Time    04/03/13  8:55 AM      Result Value Range   WBC 7.9  4.0 - 10.5 K/uL   RBC 4.64  4.22 - 5.81 MIL/uL   Hemoglobin 15.2  13.0 - 17.0 g/dL   HCT 16.1  09.6 - 04.5 %   MCV 94.0  78.0 - 100.0 fL   MCH 32.8  26.0 - 34.0 pg   MCHC 34.9  30.0 - 36.0 g/dL   RDW 40.9  81.1 - 91.4 %   Platelets 198  150 - 400 K/uL  DIFFERENTIAL     Status: Abnormal   Collection Time    04/03/13  8:55 AM      Result Value Range   Neutrophils Relative % 75  43 - 77 %   Neutro Abs 5.9  1.7 - 7.7 K/uL   Lymphocytes Relative 9 (*) 12 - 46 %   Lymphs Abs 0.7  0.7 - 4.0 K/uL   Monocytes Relative 14 (*) 3 - 12 %   Monocytes Absolute 1.1 (*) 0.1 - 1.0 K/uL   Eosinophils Relative 2  0 - 5 %   Eosinophils Absolute 0.1  0.0 - 0.7 K/uL   Basophils Relative 1  0 - 1 %   Basophils Absolute 0.0  0.0 - 0.1 K/uL  COMPREHENSIVE METABOLIC PANEL     Status: Abnormal   Collection Time    04/03/13  8:55 AM      Result Value Range   Sodium 141  135 - 145 mEq/L   Potassium 3.9  3.5 - 5.1 mEq/L   Chloride 106  96 - 112 mEq/L   CO2 27  19 - 32 mEq/L   Glucose, Bld 106 (*) 70 - 99 mg/dL   BUN 18  6 - 23 mg/dL   Creatinine, Ser 7.82  0.50 - 1.35 mg/dL   Calcium 9.0  8.4 - 95.6 mg/dL   Total Protein 6.1  6.0 - 8.3 g/dL   Albumin 3.5  3.5 - 5.2 g/dL   AST 23  0 - 37 U/L   ALT 27  0 - 53 U/L   Alkaline Phosphatase 87  39 - 117 U/L   Total Bilirubin 0.2 (*) 0.3 - 1.2 mg/dL   GFR calc non Af Amer 62 (*) >90 mL/min   GFR calc Af Amer 72 (*) >90 mL/min   Comment: (NOTE)     The eGFR has been calculated using the CKD EPI  equation.     This calculation has not been validated in all clinical situations.     eGFR's persistently <90 mL/min signify possible Chronic Kidney     Disease.  GLUCOSE, CAPILLARY     Status: Abnormal   Collection Time    04/03/13  9:17 AM      Result Value Range   Glucose-Capillary 104 (*) 70 - 99 mg/dL  URINE RAPID DRUG SCREEN (HOSP PERFORMED)  Status: None   Collection Time    04/03/13  9:42 AM      Result Value Range   Opiates NONE DETECTED  NONE DETECTED   Cocaine NONE DETECTED  NONE DETECTED   Benzodiazepines NONE DETECTED  NONE DETECTED   Amphetamines NONE DETECTED  NONE DETECTED   Tetrahydrocannabinol NONE DETECTED  NONE DETECTED   Barbiturates NONE DETECTED  NONE DETECTED   Comment:            DRUG SCREEN FOR MEDICAL PURPOSES     ONLY.  IF CONFIRMATION IS NEEDED     FOR ANY PURPOSE, NOTIFY LAB     WITHIN 5 DAYS.                LOWEST DETECTABLE LIMITS     FOR URINE DRUG SCREEN     Drug Class       Cutoff (ng/mL)     Amphetamine      1000     Barbiturate      200     Benzodiazepine   200     Tricyclics       300     Opiates          300     Cocaine          300     THC              50  URINALYSIS, ROUTINE W REFLEX MICROSCOPIC     Status: None   Collection Time    04/03/13  9:42 AM      Result Value Range   Color, Urine YELLOW  YELLOW   APPearance CLEAR  CLEAR   Specific Gravity, Urine 1.015  1.005 - 1.030   pH 6.5  5.0 - 8.0   Glucose, UA NEGATIVE  NEGATIVE mg/dL   Hgb urine dipstick NEGATIVE  NEGATIVE   Bilirubin Urine NEGATIVE  NEGATIVE   Ketones, ur NEGATIVE  NEGATIVE mg/dL   Protein, ur NEGATIVE  NEGATIVE mg/dL   Urobilinogen, UA 0.2  0.0 - 1.0 mg/dL   Nitrite NEGATIVE  NEGATIVE   Leukocytes, UA NEGATIVE  NEGATIVE   Comment: MICROSCOPIC NOT DONE ON URINES WITH NEGATIVE PROTEIN, BLOOD, LEUKOCYTES, NITRITE, OR GLUCOSE <1000 mg/dL.   Ct Head Wo Contrast  04/03/2013   CLINICAL DATA:  Left-sided weakness.  EXAM: CT HEAD WITHOUT CONTRAST  TECHNIQUE:  Contiguous axial images were obtained from the base of the skull through the vertex without intravenous contrast.  COMPARISON:  09/10/2010 head CT.  FINDINGS: No intracranial hemorrhage.  Small vessel disease type changes without CT evidence of large acute infarct. Streak artifact limits evaluation of the posterior fossa.  No hydrocephalus.  No intracranial mass lesion noted on this unenhanced exam.  Vascular calcifications. Minimal to mild paranasal sinus mucosal thickening.  Mild exophthalmos.  IMPRESSION: No intracranial hemorrhage.  Small vessel disease type changes without CT evidence of large acute infarct. Streak artifact limits evaluation of the posterior fossa.   Electronically Signed   By: Bridgett Larsson M.D.   On: 04/03/2013 09:39     Assessment: 79 y.o. male with new onset left HP and left face weakness. CT brain unremarkable. Last known well the night before coming to the hospital, thus no thrombolytic therapy offered. Admit to medicine and complete stroke work up. Aspirin. Can not have MRI due to pacemaker, thus will get follow up CT brain without contrast in am.  Stroke Risk Factors -age, HTN, hyperlipidemia, carotid disease.  Plan: 1. HgbA1c, fasting lipid panel 2. MRI, MRA  of the brain without contrast (can not have due to pacemaker) 3. Echocardiogram 4. Carotid dopplers 5. Prophylactic therapy-aspirin 6. Risk factor modification 7. Telemetry monitoring 8. Frequent neuro checks 9. PT/OT SLP  Wyatt Portela, MD [facility] [phone]  04/03/2013, 12:27 PM  Addendum; I had an ample conversation with patient's wife and she told me that Mr. Statz has " 80% narrowing both carotid arteries" and " pacemaker because of atrial fibrillation".  Predominant involvement of face and arm is concerning for embolic infarct that could be secondary to artery to artery embolism from ipsilateral carotid disease or perhaps from paroxysmal atrial fibrillation, although his  pacemeker has been check and is working properly. Unfortunatley, can not have MRI brain. Await for carotid ultrasound and TTE.   Wyatt Portela, MD

## 2013-04-03 NOTE — ED Notes (Addendum)
Admitting MD at bedside, 4N RN made aware of delay

## 2013-04-03 NOTE — H&P (Signed)
[facility] History and Physical  [name]:[id number] DOB: [birth year] DOA: 04/03/2013  Referring physician: Dr. Clarene Duke PCP: Carrie Mew, MD   Chief Complaint: Left-sided weakness and numbness, left facial droop  HPI: Christopher Baker is a 79 y.o. male with medical history significant for hypertension, hyperlipidemia, paroxysmal atrial fibrillation status post pacemaker (currently no receiving any anticoagulation therapy), GERD, psoriasis and history of carotid artery stenosis; came to the hospital secondary to left hemiparesis/numbness and left face droop. Patient was last time seen normal the night prior to admission. He said that he woke up feeling that something was wrong with his left side (kind of weak/non-), his family got out of bed went to the kitchen and tried to open a can of cat food rising to his hand was not working well and he cannot move it. He wife also realized that the patient was having a left facial droop and at that moment was brought to ED by EMS. Patient denies headache, vertigo, vision trouble, difficulty swallowing, slurred speech, no chest pain, no palpitations, no shortness or any other acute complaints. Patient has never experienced similar symptoms in the past. In the ED a CT scan of the head showed no acute abnormality. [facility] has been called to admit the patient for further evaluation and treatment   Review of Systems:  Negative except as otherwise mentioned on history of present illness.  Past Medical History  Diagnosis Date  . Hyperlipidemia   . HTN (hypertension)   . Status post placement of cardiac pacemaker DDD-  06-04-10-- LAST CHECK 09-08-10 IN EPIC    SECONDARY TO SYNCOPY AND BRADYCARDIA  . Restless leg syndrome   . Normal nuclear stress test 2008    LOW RISK--   DR  WALL  . Carotid stenosis, bilateral PER DR EARLY / DUPLEX  09-09-10      40 - 59%   BILATERALLY--- ASYMPTOMATIC  . History of echocardiogram 11-21-2006     EF 65%  . History of prostate cancer S/P RADIATION  TX  6 YRS AGO  . Psoriasis   . Arthritis with psoriasis   . GERD (gastroesophageal reflux disease)     CONTROLLED W/ PROTONIX  . Borderline diabetes     DIET CONTROLLED - PT STATES HE IS NOT DIABETIC  . Lumbar spondylosis W/ RADICULOPATHY  . Iron deficiency   . Cataract immature LEFT EYE  . PAF (paroxysmal atrial fibrillation)   . SVT (supraventricular tachycardia)     S/P ABLATION   2008  . Coronary artery disease CARDIOLOGIST- DR Sharrell Ku    09-08-10 VISIT AND PACEMAKER CHECK  IN EPIC  . Cancer     HX OF PROSTATE CANCER--TX'D WITH RADIATION  . Sleep apnea TESTED YRS AGO-- NO CPAP RX GIVEN    PT STATES HE DOES NOT THINK HE STILL HAS SLEEP APNEA--DOES NOT USE CPAP   Past Surgical History  Procedure Laterality Date  . Replacement total knee  1997    RIGHT  . Cardiac pacemaker placement  06-04-10    DDD/ AND REMOVAL LOOR RECORDER  . Loop recorder placement  01-30-10  . Lumbar laminectomy/ diskectomy/ fusion  05-19-10    L4 - 5  . Cholecystectomy  2009  . Cardiac electrophysiology study and ablation  2008    FOR SVT  . Prostate biopsy  2007  . Transurethral resection of prostate  2006  . Inguinal hernia repair  2005    LEFT/   DONE WITH PENILE PROSTESIS SURG.  Marland Kitchen  Removal and placement penile prosthesis implant   2005    AND LEFT CORPOROPLASTY /    DONE INGUINAL REPAIR  . Penile prosthesis implant  1995  . Knee arthroscopy  06/02/2011    Procedure: ARTHROSCOPY KNEE;  Surgeon: Loanne Drilling;  Location: [facility];  Service: Orthopedics;  Laterality: Left;  LEFT KNEE ARTHROSCOPY WITH DEBRIDEMENT  . Total knee arthroplasty  12/20/2011    Procedure: TOTAL KNEE ARTHROPLASTY;  Surgeon: Loanne Drilling, MD;  Location: [facility];  Service: Orthopedics;  Laterality: Left;  . Back surgery     Social History:  reports that he has quit smoking. His smoking use included Cigarettes. He has a 5.5 pack-year smoking  history. He has never used smokeless tobacco. He reports that he drinks alcohol. He reports that he does not use illicit drugs.   Allergies  Allergen Reactions  . Ciprofloxacin Nausea Only    REACTION: GI upset  . Other     SCOLLOPS--SUDDEN GI ATTACK    Family History  Problem Relation Age of Onset  . Stroke Mother   . Early death Father    Prior to Admission medications   Medication Sig Start Date End Date Taking? Authorizing Provider  amLODipine (NORVASC) 5 MG tablet Take 5 mg by mouth daily.   Yes Historical Provider, MD  atorvastatin (LIPITOR) 20 MG tablet Take 20 mg by mouth daily. TAKE ONE TABLET BY MOUTH DAILY 10/04/12  Yes Marinus Maw, MD  citalopram (CELEXA) 20 MG tablet Take 1 tablet (20 mg total) by mouth daily. 11/29/12  Yes Stacie Glaze, MD  clotrimazole-betamethasone (LOTRISONE) cream Apply 1 application topically 2 (two) times daily. For psoriasis.   Yes Historical Provider, MD  diclofenac sodium (VOLTAREN) 1 % GEL Apply 1 application topically 2 (two) times daily as needed. Arthritis in both thumbs.   Yes Historical Provider, MD  esomeprazole (NEXIUM) 40 MG capsule Take 1 capsule (40 mg total) by mouth 2 (two) times daily. 11/29/12  Yes Stacie Glaze, MD  ezetimibe (ZETIA) 10 MG tablet Take 10 mg by mouth daily.   Yes Historical Provider, MD  iron polysaccharides (NIFEREX) 150 MG capsule Take 150 mg by mouth 2 (two) times daily.   Yes Historical Provider, MD  methotrexate (RHEUMATREX) 2.5 MG tablet TAKE FOUR TABLETS BY MOUTH ONCE A WEEK. 05/21/12  Yes Stacie Glaze, MD  methotrexate (RHEUMATREX) 2.5 MG tablet Take 10 mg by mouth once a week. Caution:Chemotherapy. Protect from light. Pt takes 4 tablets on Monday of each week   Yes Historical Provider, MD  MYRBETRIQ 50 MG TB24 tablet Take 50 mg by mouth daily.  02/19/13  Yes Historical Provider, MD  nabumetone (RELAFEN) 750 MG tablet TAKE TWO TABLETS DAILY 12/16/12  Yes Stacie Glaze, MD  nabumetone (RELAFEN) 750 MG  tablet Take 750 mg by mouth 2 (two) times daily.   Yes Historical Provider, MD  OXYBUTYNIN CHLORIDE PO Take 5 mg by mouth 2 (two) times daily.    Yes Historical Provider, MD  predniSONE (DELTASONE) 5 MG tablet Take 5 mg by mouth daily.   Yes Historical Provider, MD  rOPINIRole (REQUIP) 2 MG tablet Take 2 mg by mouth at bedtime.   Yes Historical Provider, MD  rOPINIRole (REQUIP) 2 MG tablet Take 2 mg by mouth 2 (two) times daily.   Yes Historical Provider, MD  triamcinolone cream (KENALOG) 0.1 % APPLY TO AFFECTED AREAS AS DIRECTED 12/24/12  Yes Stacie Glaze, MD  VESICARE 10  MG tablet Take 10 mg by mouth daily.  01/18/13  Yes Historical Provider, MD  Cyanocobalamin 500 MCG/0.1ML SOLN Place 500 mcg into the nose once a week. 09/04/12   Stacie Glaze, MD   Physical Exam: Filed Vitals:   04/03/13 1145  BP: 160/71  Pulse: 78  Temp:   Resp: 21     General:  No acute distress, afebrile, patient with left facial droop, able to speak in full sentences, no dysarthria; alert, awake and oriented x3.  Eyes: No nystagmus, PERRLA, extraocular muscles intact  ENT: Moist mucous membranes, for dentition, no erythema or exudate inside his mouth, uvula midline; no tongue deviation. There is no drainage out of ears or nostrils  Neck: Supple, no thyromegaly, no bruits on exam  Cardiovascular: S1 and S2, no rubs, no gallops, no murmurs  Respiratory: Clear to auscultation bilaterally.  Abdomen: Soft, nontender, nondistended, positive bowel sounds  Skin: no open wounds; minimal signs of psoriatic lesions  Musculoskeletal: no joint swelling, no LE edema, no erythema  Psychiatric: no SI or hallucinations  Neurologic: Alert, awake and oriented x3, cranial nerves grossly intact, intact pinprick and light-touch sensation bilaterally symmetrically; significant more weakness on his left upper extremity (patient also reports some numbness); the rest of his extremities % muscle strength 5/5 with normal tone  throughout and no atrophy.  Labs on Admission:  Basic Metabolic Panel:  Recent Labs Lab 04/03/13 0855  NA 141  K 3.9  CL 106  CO2 27  GLUCOSE 106*  BUN 18  CREATININE 1.11  CALCIUM 9.0   Liver Function Tests:  Recent Labs Lab 04/03/13 0855  AST 23  ALT 27  ALKPHOS 87  BILITOT 0.2*  PROT 6.1  ALBUMIN 3.5   CBC:  Recent Labs Lab 04/03/13 0855  WBC 7.9  NEUTROABS 5.9  HGB 15.2  HCT 43.6  MCV 94.0  PLT 198   CBG:  Recent Labs Lab 04/03/13 0917  GLUCAP 104*    Radiological Exams on Admission: Ct Head Wo Contrast  04/03/2013   CLINICAL DATA:  Left-sided weakness.  EXAM: CT HEAD WITHOUT CONTRAST  TECHNIQUE: Contiguous axial images were obtained from the base of the skull through the vertex without intravenous contrast.  COMPARISON:  09/10/2010 head CT.  FINDINGS: No intracranial hemorrhage.  Small vessel disease type changes without CT evidence of large acute infarct. Streak artifact limits evaluation of the posterior fossa.  No hydrocephalus.  No intracranial mass lesion noted on this unenhanced exam.  Vascular calcifications. Minimal to mild paranasal sinus mucosal thickening.  Mild exophthalmos.  IMPRESSION: No intracranial hemorrhage.  Small vessel disease type changes without CT evidence of large acute infarct. Streak artifact limits evaluation of the posterior fossa.   Electronically Signed   By: Bridgett Larsson M.D.   On: 04/03/2013 09:39    EKG:  Ventricular Rate: 67  PR Interval: 197  QRS Duration: 95  QT Interval: 401  QTC Calculation: 424  Text Interpretation: Sinus rhythm Aberrant complex Borderline repolarization abnormality   Assessment/Plan 1-Left side weakness and left facial droop: with stroke risk factors significant for HTN, HLD, PAF; high concerns for CVA (cerebral infarction). -Patient out of therapeutic window for TPA -Unable to performed MRI secondary to pacemaker implantation. -CT head without contrast do not show any acute hemorrhagic  stroke or acute abnormality. -Will admit to telemetry -Neurology has been consulted and will follow recommendations; plan is to repeat CT w/o contrast in am. -Will check carotid Dopplers, 2-D echo, TSH, B12, A1c  and lipid panel. -aspirin for secondary prevention  2-HYPERLIPIDEMIA: Will check lipid panel. Continue statins  3-HYPERTENSION: Currently stable. Will continue antihypertensive regimen and heart healthy diet.  4-Paroxysmal ATRIAL fibrillation: s/p pacemaker. No using any anticoagulation. -currently sinus -will check on telemetry -will need at least full dose aspirin daily  5-EXTRINSIC ASTHMA, WITH EXACERBATION: stable. No SOB, no wheezing. Will follow and use PRN inhaler if needed.  6-GERD:continue PPI  7-psoriatic arthritis: The patient actively receiving methotrexate and prednisone as an outpatient. -Will continue prednisone -Currently no swelling of his joints and no pain.  DVT: Heparin.  Neurology (Dr. Leroy Kennedy consulted)  Code Status: Full Family Communication: friend at bedside Disposition Plan: LOS < 2 midnights, observation, telemetry  Time spent: 50 minutes  Taija Mathias [facility] Pager [phone]  If 7PM-7AM, please contact night-coverage [email or web address] Password [facility] 04/03/2013, 12:34 PM

## 2013-04-03 NOTE — ED Notes (Signed)
[place] at bedside. 

## 2013-04-03 NOTE — ED Notes (Signed)
Family at bedside. 

## 2013-04-04 ENCOUNTER — Observation Stay (HOSPITAL_COMMUNITY): Payer: Medicare Other

## 2013-04-04 ENCOUNTER — Encounter (HOSPITAL_COMMUNITY): Payer: Self-pay | Admitting: Radiology

## 2013-04-04 ENCOUNTER — Inpatient Hospital Stay (HOSPITAL_COMMUNITY): Payer: Medicare Other

## 2013-04-04 ENCOUNTER — Telehealth: Payer: Self-pay | Admitting: Internal Medicine

## 2013-04-04 DIAGNOSIS — R55 Syncope and collapse: Secondary | ICD-10-CM

## 2013-04-04 DIAGNOSIS — Z950 Presence of cardiac pacemaker: Secondary | ICD-10-CM

## 2013-04-04 DIAGNOSIS — R51 Headache: Secondary | ICD-10-CM

## 2013-04-04 LAB — CBC
HCT: 42.5 % (ref 39.0–52.0)
Hemoglobin: 14.6 g/dL (ref 13.0–17.0)
MCH: 32.2 pg (ref 26.0–34.0)
MCHC: 34.4 g/dL (ref 30.0–36.0)
MCV: 93.8 fL (ref 78.0–100.0)
Platelets: 196 10*3/uL (ref 150–400)
WBC: 10.7 10*3/uL — ABNORMAL HIGH (ref 4.0–10.5)

## 2013-04-04 LAB — BASIC METABOLIC PANEL
BUN: 14 mg/dL (ref 6–23)
Creatinine, Ser: 0.93 mg/dL (ref 0.50–1.35)
GFR calc non Af Amer: 79 mL/min — ABNORMAL LOW (ref >90)
Glucose, Bld: 104 mg/dL — ABNORMAL HIGH (ref 70–99)
Potassium: 3.8 mEq/L (ref 3.5–5.1)

## 2013-04-04 LAB — LIPID PANEL
Cholesterol: 169 mg/dL (ref 0–200)
HDL: 50 mg/dL (ref >39)
LDL Cholesterol: 88 mg/dL (ref 0–99)

## 2013-04-04 LAB — HEMOGLOBIN A1C: Mean Plasma Glucose: 111 mg/dL (ref <117)

## 2013-04-04 MED ORDER — AMLODIPINE BESYLATE 10 MG PO TABS
10.0000 mg | ORAL_TABLET | Freq: Every day | ORAL | Status: DC
  Administered 2013-04-05 – 2013-04-06 (×2): 10 mg via ORAL
  Filled 2013-04-04 (×2): qty 1

## 2013-04-04 MED ORDER — RIVAROXABAN 20 MG PO TABS
20.0000 mg | ORAL_TABLET | Freq: Every day | ORAL | Status: DC
  Administered 2013-04-04 – 2013-04-05 (×2): 20 mg via ORAL
  Filled 2013-04-04 (×3): qty 1

## 2013-04-04 MED ORDER — IOHEXOL 350 MG/ML SOLN
50.0000 mL | Freq: Once | INTRAVENOUS | Status: AC | PRN
  Administered 2013-04-04: 50 mL via INTRAVENOUS

## 2013-04-04 NOTE — Progress Notes (Signed)
   CARE MANAGEMENT NOTE 04/04/2013  Patient:  Christopher Baker, Christopher Baker   Account Number:  [id number]  Date Initiated:  04/04/2013  Documentation initiated by:  Jiles Crocker  Subjective/Objective Assessment:   ADMITTED WITH STROKE     Action/Plan:   PCP: Carrie Mew, MD  LIVES AT HOME WITH SPOUSE; CM FOLLOWING FOR DCP   Anticipated DC Date:  04/11/2013   Anticipated DC Plan:  HOME W HOME HEALTH SERVICES VS SHORT TERM SNF; AWAITING FOR PT/OT EVALS     DC Planning Services  CM consult         Status of service:  In process, will continue to follow Medicare Important Message given?  NA - LOS <3 / Initial given by admissions (If response is "NO", the following Medicare IM given date fields will be blank)  Per UR Regulation:  Reviewed for med. necessity/level of care/duration of stay  Comments:  10/15/2014Abelino Derrick RN,BSN,MHA [phone]

## 2013-04-04 NOTE — Progress Notes (Addendum)
CARDIOLOGY CONSULT NOTE   Patient ID: Christopher Baker MRN: [id number], DOB/AGE: 77-[name]-1935   Admit date: 04/03/2013 Date of Consult: 04/04/2013   Primary Physician: Carrie Mew, MD Primary Cardiologist: Lewayne Bunting  Pt. Profile  CVA, a-fib  Problem List  Past Medical History  Diagnosis Date  . Hyperlipidemia   . HTN (hypertension)   . Status post placement of cardiac pacemaker DDD-  06-04-10-- LAST CHECK 09-08-10 IN EPIC    SECONDARY TO SYNCOPY AND BRADYCARDIA  . Restless leg syndrome   . Normal nuclear stress test 2008    LOW RISK--   DR  WALL  . Carotid stenosis, bilateral PER DR EARLY / DUPLEX  09-09-10      40 - 59%   BILATERALLY--- ASYMPTOMATIC  . History of echocardiogram 11-21-2006    EF 65%  . History of prostate cancer S/P RADIATION  TX  6 YRS AGO  . Psoriasis   . Arthritis with psoriasis   . GERD (gastroesophageal reflux disease)     CONTROLLED W/ PROTONIX  . Borderline diabetes     DIET CONTROLLED - PT STATES HE IS NOT DIABETIC  . Lumbar spondylosis W/ RADICULOPATHY  . Iron deficiency   . Cataract immature LEFT EYE  . PAF (paroxysmal atrial fibrillation)   . SVT (supraventricular tachycardia)     S/P ABLATION   2008  . Coronary artery disease CARDIOLOGIST- DR Sharrell Ku    09-08-10 VISIT AND PACEMAKER CHECK  IN EPIC  . Cancer     HX OF PROSTATE CANCER--TX'D WITH RADIATION  . Sleep apnea TESTED YRS AGO-- NO CPAP RX GIVEN    PT STATES HE DOES NOT THINK HE STILL HAS SLEEP APNEA--DOES NOT USE CPAP    Past Surgical History  Procedure Laterality Date  . Replacement total knee  1997    RIGHT  . Cardiac pacemaker placement  06-04-10    DDD/ AND REMOVAL LOOR RECORDER  . Loop recorder placement  01-30-10  . Lumbar laminectomy/ diskectomy/ fusion  05-19-10    L4 - 5  . Cholecystectomy  2009  . Cardiac electrophysiology study and ablation  2008    FOR SVT  . Prostate biopsy  2007  . Transurethral resection of prostate  2006  . Inguinal hernia  repair  2005    LEFT/   DONE WITH PENILE PROSTESIS SURG.  . Removal and placement penile prosthesis implant   2005    AND LEFT CORPOROPLASTY /    DONE INGUINAL REPAIR  . Penile prosthesis implant  1995  . Knee arthroscopy  06/02/2011    Procedure: ARTHROSCOPY KNEE;  Surgeon: Loanne Drilling;  Location: [facility];  Service: Orthopedics;  Laterality: Left;  LEFT KNEE ARTHROSCOPY WITH DEBRIDEMENT  . Total knee arthroplasty  12/20/2011    Procedure: TOTAL KNEE ARTHROPLASTY;  Surgeon: Loanne Drilling, MD;  Location: [facility];  Service: Orthopedics;  Laterality: Left;  . Back surgery      Allergies  Allergies  Allergen Reactions  . Ciprofloxacin Nausea Only    REACTION: GI upset  . Other     SCOLLOPS--SUDDEN GI ATTACK   HPI   79 year old male who was admitted for an acute LUE weakness and was diagnosed with a stroke in the right caudate head and anterior putamen, likely 2nd focus of acute infarction involving the subcortical right parietal lobe. He is out of TPA window. CT ruled out hemorrhage. The patient has h/o significant for hypertension, hyperlipidemia, OSA, SVT, s/p  RFA, paroxysmal atrial fibrillation, s/p pacemaker placement in 2012 by Dr Lewayne Bunting. He has not been anticoagulated. Per Dr Ladona Ridgel the patient was not very compliant with doctors visits and there was concern about compliance with Coumadine. He had know PAD with carotid stenosis 40-59% bilaterally that is stable during this visit.   Inpatient Medications  . amLODipine  5 mg Oral Daily  . aspirin  300 mg Rectal Daily   Or  . aspirin  325 mg Oral Daily  . atorvastatin  20 mg Oral q1800  . citalopram  20 mg Oral Daily  . darifenacin  7.5 mg Oral Daily  . ezetimibe  10 mg Oral Daily  . heparin  5,000 Units Subcutaneous Q8H  . influenza vac split quadrivalent PF  0.5 mL Intramuscular Tomorrow-1000  . iron polysaccharides  150 mg Oral BID  . mirabegron ER  50 mg Oral Daily  . oxybutynin  5 mg Oral BID  .  pantoprazole  40 mg Oral BID  . predniSONE  5 mg Oral Q breakfast  . rOPINIRole  2 mg Oral BID    Family History Family History  Problem Relation Age of Onset  . Stroke Mother   . Early death Father      Social History History   Social History  . Marital Status: Married    Spouse Name: N/A    Number of Children: N/A  . Years of Education: N/A   Occupational History  . retired    Social History Main Topics  . Smoking status: Former Smoker -- 0.10 packs/day for 55 years    Types: Cigarettes  . Smokeless tobacco: Never Used     Comment: pt statest he smokes about "once a month"   . Alcohol Use: Yes     Comment: RARE  . Drug Use: No  . Sexual Activity: Yes   Other Topics Concern  . Not on file   Social History Narrative  . No narrative on file     Review of Systems  General:  No chills, fever, night sweats or weight changes.  Cardiovascular:  No chest pain, dyspnea on exertion, edema, orthopnea, palpitations, paroxysmal nocturnal dyspnea. Dermatological: No rash, lesions/masses Respiratory: No cough, dyspnea Urologic: No hematuria, dysuria Abdominal:   No nausea, vomiting, diarrhea, bright red blood per rectum, melena, or hematemesis Neurologic:  No visual changes, left sided weakness, changes in mental status. All other systems reviewed and are otherwise negative except as noted above.  Physical Exam  Blood pressure 170/75, pulse 60, temperature 98.2 F (36.8 C), temperature source Oral, resp. rate 20, height 6\' 3"  (1.905 m), weight 225 lb 8 oz (102.286 kg), SpO2 93.00%.  General: Pleasant, NAD Psych: Normal affect. Neuro: Alert and oriented X 3. Limited motion with LUE. HEENT: Normal  Neck: Supple without bruits or JVD. Lungs:  Resp regular and unlabored, CTA. Heart: RRR no s3, s4, or murmurs. Abdomen: Soft, non-tender, non-distended, BS + x 4.  Extremities: No clubbing, cyanosis or edema. DP/PT/Radials 2+ and equal bilaterally.  Labs  No results  found for this basename: CKTOTAL, CKMB, TROPONINI,  in the last 72 hours Lab Results  Component Value Date   WBC 10.7* 04/04/2013   HGB 14.6 04/04/2013   HCT 42.5 04/04/2013   MCV 93.8 04/04/2013   PLT 196 04/04/2013    Recent Labs Lab 04/03/13 0855  04/04/13 0530  NA 141  < > 144  K 3.9  < > 3.8  CL 106  < > 110  CO2 27  --  24  BUN 18  < > 14  CREATININE 1.11  < > 0.93  CALCIUM 9.0  --  8.9  PROT 6.1  --   --   BILITOT 0.2*  --   --   ALKPHOS 87  --   --   ALT 27  --   --   AST 23  --   --   GLUCOSE 106*  < > 104*  < > = values in this interval not displayed. Lab Results  Component Value Date   CHOL 169 04/04/2013   HDL 50 04/04/2013   LDLCALC 88 04/04/2013   TRIG 157* 04/04/2013   Radiology/Studies  Dg Chest 2 View  04/03/2013   CLINICAL DATA:  Stroke. Weakness. Hypertension.  EXAM: CHEST  2 VIEW  COMPARISON:  None.  FINDINGS: The heart size and mediastinal contours are within normal limits. Both lungs are clear. No pleural effusion or pneumothorax. Left anterior chest wall sequential pacemaker is stable in well positioned. The bony thorax is demineralized but intact.  IMPRESSION: No active cardiopulmonary disease.     Ct Head Wo Contrast  04/04/2013    IMPRESSION: 1. Newly evident infarct of the right caudate head and anterior putamen. Likely 2nd focus of acute infarction involving the subcortical right parietal lobe. Multi focal, unilateral infarcts would favor thromboembolic disease from the right carotid system. 2. No complicating hemorrhage.     Ct Head Wo Contrast  04/03/2013    IMPRESSION: No intracranial hemorrhage.  Small vessel disease type changes without CT evidence of large acute infarct. Streak artifact limits evaluation of the posterior fossa.   Electronically Signed   By: Bridgett Larsson M.D.   On: 04/03/2013 09:39   ECG  SR, atrial pacing with 1.AVB, PR interval 360 ms, PVCs, couplets  TTE 04/03/2013 Left ventricle: The cavity size was at the  upper limits of normal. Wall thickness was normal. Systolic function was low normal to mildly reduced. The estimated ejection fraction was in the range of 50% to 55%. Wall motion was normal; there were no regional wall motion abnormalities. Doppler parameters are consistent with abnormal left ventricular relaxation (grade 1 diastolic dysfunction). - Aortic valve: Trileaflet; moderately calcified leaflets. Sclerosis without stenosis. - Mitral valve: No significant regurgitation. - Left atrium: The atrium was mildly dilated. - Right ventricle: The cavity size was normal. Systolic function was normal. - Right atrium: The atrium was mildly dilated. - Tricuspid valve: Peak RV-RA gradient: 28mm Hg (S). - Pulmonary arteries: PA peak pressure: 33mm Hg (S). - Systemic veins: IVC was not visualized. - Line: A venous catheter was visualized in the superior vena cava, with its tip in the right atrium. No abnormal features noted     ASSESSMENT AND PLAN  79 year old male an acute stroke in the right caudate head and anterior putamen, likely 2nd focus of acute infarction involving the subcortical right parietal lobe.   1. Paroxysmal atrial fibrillation - followed by Dr Ladona Ridgel who placed a PM - a-pacing as an arrhythmia prevention. Anticoagulation not started in the past as there was compliance problem. He is CHA2DS2-VASc 6. We will place him on Xarelto. This was discussed with Neurology PA. There is no hemorrhage on CT. It was discussed with the patient. The risks and benefits were explained. His concern are skin bruises that he developes easily after minor injuries. He is willing to try Xarelto. He was explained risk of thromboembolic events if discontinued abruptly. We will consult  pharmacy for Xarelto initiation. There is a-paced rhythm with prolonged AV conduction - PR 360 ms. We will arrange for [employer] to interrogate the pacer (dual chamber pacer).  2. Hypertension - avoid AVN blocking  agents, increase  amlodipine to 10 mg po daily  Signed, Tobias Alexander, Rexene Edison, MD 04/04/2013, 1:24 PM

## 2013-04-04 NOTE — Evaluation (Addendum)
Speech Language Pathology Evaluation Patient Details Name: Christopher Baker MRN: [id number] DOB: 1934-06-08 Today's Date: 04/04/2013 Time: 4098-1191 SLP Time Calculation (min): 44 min  Problem List:  Patient Active Problem List   Diagnosis Date Noted  . CVA (cerebral infarction) 04/03/2013  . Occlusion and stenosis of carotid artery without mention of cerebral infarction 11/21/2012  . OA (osteoarthritis) of knee 12/20/2011  . Methotrexate, long term, current use 09/10/2011  . Knee pain, left 07/27/2011  . Pacemaker-Medtronic 09/08/2010  . UNSPECIFIED VISUAL DISTURBANCE 07/20/2010  . SYNCOPE 06/01/2010  . IRON DEFIC ANEMIA SEC DIET IRON INTAKE 04/20/2010  . PSORIASIS 04/20/2010  . HEADACHE, PERSISTENT 01/13/2010  . HEMATURIA UNSPECIFIED 11/19/2009  . PHLEBITIS AND THROMBOPHLEBITIS OF OTHER SITE 07/02/2009  . CELLULITIS, LEG, LEFT 07/02/2009  . DISUSE OSTEOPOROSIS 05/12/2009  . HYPERGLYCEMIA, BORDERLINE 01/10/2009  . SPONDYLOSIS, LUMBAR, WITH RADICULOPATHY 10/15/2008  . OTHER SPECIFIED DERMATOMYCOSES 06/24/2008  . EXTRINSIC ASTHMA, WITH EXACERBATION 05/01/2008  . CALCU GALLBLADD W/OTH CHOLECYSTITIS&OBSTRUCTION 02/20/2008  . VARICOSE VEINS LOWER EXTREMITIES W/INFLAMMATION 12/25/2007  . EDEMA 12/25/2007  . INSOMNIA WITH SLEEP APNEA UNSPECIFIED 08/31/2007  . GERD 03/23/2007  . FIBRILLATION, ATRIAL 01/31/2007  . PSORIATIC ARTHRITIS 01/31/2007  . ABDOMINAL BLOATING 01/31/2007  . HYPERLIPIDEMIA 01/19/2007  . HYPERTENSION 01/19/2007  . PROSTATE CANCER, HX OF 01/19/2007   Past Medical History:  Past Medical History  Diagnosis Date  . Hyperlipidemia   . HTN (hypertension)   . Status post placement of cardiac pacemaker DDD-  06-04-10-- LAST CHECK 09-08-10 IN EPIC    SECONDARY TO SYNCOPY AND BRADYCARDIA  . Restless leg syndrome   . Normal nuclear stress test 2008    LOW RISK--   DR  WALL  . Carotid stenosis, bilateral PER DR EARLY / DUPLEX  09-09-10      40 - 59%   BILATERALLY---  ASYMPTOMATIC  . History of echocardiogram 11-21-2006    EF 65%  . History of prostate cancer S/P RADIATION  TX  6 YRS AGO  . Psoriasis   . Arthritis with psoriasis   . GERD (gastroesophageal reflux disease)     CONTROLLED W/ PROTONIX  . Borderline diabetes     DIET CONTROLLED - PT STATES HE IS NOT DIABETIC  . Lumbar spondylosis W/ RADICULOPATHY  . Iron deficiency   . Cataract immature LEFT EYE  . PAF (paroxysmal atrial fibrillation)   . SVT (supraventricular tachycardia)     S/P ABLATION   2008  . Coronary artery disease CARDIOLOGIST- DR Sharrell Ku    09-08-10 VISIT AND PACEMAKER CHECK  IN EPIC  . Cancer     HX OF PROSTATE CANCER--TX'D WITH RADIATION  . Sleep apnea TESTED YRS AGO-- NO CPAP RX GIVEN    PT STATES HE DOES NOT THINK HE STILL HAS SLEEP APNEA--DOES NOT USE CPAP   Past Surgical History:  Past Surgical History  Procedure Laterality Date  . Replacement total knee  1997    RIGHT  . Cardiac pacemaker placement  06-04-10    DDD/ AND REMOVAL LOOR RECORDER  . Loop recorder placement  01-30-10  . Lumbar laminectomy/ diskectomy/ fusion  05-19-10    L4 - 5  . Cholecystectomy  2009  . Cardiac electrophysiology study and ablation  2008    FOR SVT  . Prostate biopsy  2007  . Transurethral resection of prostate  2006  . Inguinal hernia repair  2005    LEFT/   DONE WITH PENILE PROSTESIS SURG.  . Removal and placement penile prosthesis implant  2005    AND LEFT CORPOROPLASTY /    DONE INGUINAL REPAIR  . Penile prosthesis implant  1995  . Knee arthroscopy  06/02/2011    Procedure: ARTHROSCOPY KNEE;  Surgeon: Loanne Drilling;  Location: [facility];  Service: Orthopedics;  Laterality: Left;  LEFT KNEE ARTHROSCOPY WITH DEBRIDEMENT  . Total knee arthroplasty  12/20/2011    Procedure: TOTAL KNEE ARTHROPLASTY;  Surgeon: Loanne Drilling, MD;  Location: [facility];  Service: Orthopedics;  Laterality: Left;  . Back surgery     HPI:  79 yo male with h/o vitamin B12  deficiency adm with left sided weakness and facial drooop.  Pt found to have a subcortical right parietal CVA, right caudate nucleus, anterior putamen.  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  Pt manages home finances and his own business from his house.  He and spouse admit to memory difficulties and word finding difficulties prior to admission.     Assessment / Plan / Recommendation Clinical Impression  Pt presents with moderate cognitive linguistic deficits resulting in anosognosia, significant left inattention, ? flat affect.  Pt required max verbal, tactile cueing to attend to left side of paper during evaluation- initially denying changes in vision until after planned failure.  Perseveration noted x1 only.   He also has trigeminal, facial, and hypoglossal nerve impairments c/b facial droop, decreased facial sensation and lingual deviation.  His speech is fluent and semantics intact for basic conversation.  Spouse and pt admit to pt having word finding and memory impairments for approx one year prior to admission, attributed to B12 deficiency.  Skilled intervention included education of pt/spouse [name] to test results, therapeutic feedback to compensate for left inattn (having visitors stand to left,etc) and establishment of pt goals.    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  Spouse also expressed concern at decreased verbal output by pt since his CVA, as normally he is verbose and very witty.  Rec consider CIR if PT/OT agree - pt and spouse agreeable to speech pathology goals.      SLP Assessment  Patient needs continued Speech Lanaguage Pathology Services    Follow Up Recommendations  Inpatient Rehab    Frequency and Duration min 2x/week   2 weeks   Pertinent Vitals/Pain Afebrile, decreased   SLP Goals  SLP Goals Potential to Achieve Goals: Good  SLP Evaluation Prior Functioning  Cognitive/Linguistic Baseline: Baseline deficits (deficits worse few days prior to B12 &after, shots biweekly) Type of Home: House  Lives With: Spouse Available Help at Discharge: Family Education:  (few years college, Public relations account executive) Vocation: Part time employment (Psychologist, sport and exercise)   Cognition  Arousal/Alertness: Awake/alert Orientation Level: Oriented X4 Attention: Sustained Sustained Attention:  (decreased attention to left) Memory:  (impaired prospective memory and short term memory) Awareness: Impaired (aware to left inattention after planned failure) Awareness Impairment: Intellectual impairment;Emergent impairment Problem Solving: Appears intact (for basic environment/math, inattn may impact function) Executive Function:  (tba) Safety/Judgment: Appears intact (nurse tech reports pt frustrated w/ not getting oob alone)    Comprehension  Auditory Comprehension Yes/No Questions: Not tested Commands: Impaired Two Step Basic Commands: 75-100% accurate Multistep Basic Commands: 75-100% accurate Conversation: Complex Visual Recognition/Discrimination Discrimination:  (difficulty seeing items on left side, eg:  clock in room) Reading Comprehension Reading Status: Not tested    Expression Expression Primary Mode of Expression: Verbal Verbal Expression Overall Verbal Expression: Appears within functional limits for tasks assessed Initiation: No impairment Level of Generative/Spontaneous Verbalization: Sentence Repetition: No impairment Naming: No impairment (except vision difficulty with seeing octopus picture) Pragmatics: No impairment Other Verbal Expression Comments: Spouse reports pt verbose prior to admission and has noted decreased verbal outpt, answering questions in one to word answers.  Pt able to name 17 animals in 60  seconds.  He admits to some word finding deficits prior to admission x1 year d/t vit B12 deficiency.  Written Expression Dominant Hand: Right Written Expression: Not tested   Oral / Motor Oral Motor/Sensory Function Overall Oral Motor/Sensory Function: Impaired Labial ROM: Reduced left Labial Symmetry: Abnormal symmetry left Labial Strength: Reduced Lingual ROM: Reduced left Lingual Strength: Reduced Facial ROM: Reduced left Facial Symmetry: Left drooping eyelid;Left droop Facial Strength: Reduced Facial Sensation: Reduced Velum: Within Functional Limits Mandible: Other (Comment) (DNT) Motor Speech Overall Motor Speech: Appears within functional limits for tasks assessed Respiration: Within functional limits Phonation: Normal Resonance: Within functional limits Intelligibility: Intelligible Motor Speech Errors: Not applicable   GO     Donavan Burnet, MS [facility] SLP [phone]

## 2013-04-04 NOTE — Progress Notes (Signed)
Stroke Team Progress Note  HISTORY Christopher Baker is an 79 y.o. male, right handed, with a past medical history significant for HTN, hyperlipidemia, atrial fibrilation s/p pacemaker placement, bilateral carotid stenosis (80% according to wife), brought to [facility] ED 04/03/2013 for evaluation of new onset symptoms. He said that he went to bed last night feeling well, but when she tried to get up the bed around 630 this morning " something was wrong with my left side, kind of numb and weak" and when he finally got out the bed went to the kitchen and tried to open a can of cat food just to realized that " my hand was not really working well". He tells me that he fell and had to struggle to get up. Furthermore, he said that around 7 or 730 am his wife noticed that the left side of his face was droopier than the right side. Never had similar symptoms before.  No HA, vertigo, double vision, difficulty swallowing, slurred speech, language or vision impairment. CT brain showed no acute abnormality.  Patient was not a TPA candidate secondary to waking with symptoms. He was admitted for further evaluation and treatment.  SUBJECTIVE His wife is at the bedside, his daughter Christopher Baker is on the speaker phone.  Overall he feels his condition is stable.   OBJECTIVE Most recent Vital Signs: Filed Vitals:   04/03/13 1934 04/03/13 2133 04/03/13 2330 04/04/13 0546  BP: 166/80 172/71 163/76 164/80  Pulse: 66 68 71 63  Temp: 98.8 F (37.1 C) 98 F (36.7 C) 98 F (36.7 C) 98.3 F (36.8 C)  TempSrc: Oral Oral Oral Oral  Resp: 20 20 20 20   Height:      Weight:      SpO2: 98% 96% 95% 97%   CBG (last 3)   Recent Labs  04/03/13 0917  GLUCAP 104*    IV Fluid Intake:     MEDICATIONS  . amLODipine  5 mg Oral Daily  . aspirin  300 mg Rectal Daily   Or  . aspirin  325 mg Oral Daily  . atorvastatin  20 mg Oral q1800  . citalopram  20 mg Oral Daily  . darifenacin  7.5 mg Oral Daily  . ezetimibe  10 mg Oral Daily  .  heparin  5,000 Units Subcutaneous Q8H  . influenza vac split quadrivalent PF  0.5 mL Intramuscular Tomorrow-1000  . iron polysaccharides  150 mg Oral BID  . mirabegron ER  50 mg Oral Daily  . oxybutynin  5 mg Oral BID  . pantoprazole  40 mg Oral BID  . predniSONE  5 mg Oral Q breakfast  . rOPINIRole  2 mg Oral BID   PRN:  acetaminophen, diclofenac sodium  Diet:  Cardiac thin liquids Activity:    Up with assistance DVT Prophylaxis:  Heparin 5000 units sq tid   CLINICALLY SIGNIFICANT STUDIES Basic Metabolic Panel:  Recent Labs Lab 04/03/13 0855 04/03/13 0912 04/04/13 0530  NA 141 144 144  K 3.9 4.1 3.8  CL 106 106 110  CO2 27  --  24  GLUCOSE 106* 104* 104*  BUN 18 21 14   CREATININE 1.11 1.30 0.93  CALCIUM 9.0  --  8.9   Liver Function Tests:  Recent Labs Lab 04/03/13 0855  AST 23  ALT 27  ALKPHOS 87  BILITOT 0.2*  PROT 6.1  ALBUMIN 3.5   CBC:  Recent Labs Lab 04/03/13 0855 04/03/13 0912 04/04/13 0530  WBC 7.9  --  10.7*  NEUTROABS 5.9  --   --   HGB 15.2 15.3 14.6  HCT 43.6 45.0 42.5  MCV 94.0  --  93.8  PLT 198  --  196   Coagulation:  Recent Labs Lab 04/03/13 0855  LABPROT 12.5  INR 0.95   Cardiac Enzymes: No results found for this basename: CKTOTAL, CKMB, CKMBINDEX, TROPONINI,  in the last 168 hours Urinalysis:  Recent Labs Lab 04/03/13 0942  COLORURINE YELLOW  LABSPEC 1.015  PHURINE 6.5  GLUCOSEU NEGATIVE  HGBUR NEGATIVE  BILIRUBINUR NEGATIVE  KETONESUR NEGATIVE  PROTEINUR NEGATIVE  UROBILINOGEN 0.2  NITRITE NEGATIVE  LEUKOCYTESUR NEGATIVE   Lipid Panel    Component Value Date/Time   CHOL 169 04/04/2013 0530   TRIG 157* 04/04/2013 0530   HDL 50 04/04/2013 0530   CHOLHDL 3.4 04/04/2013 0530   VLDL 31 04/04/2013 0530   LDLCALC 88 04/04/2013 0530   HgbA1C  Lab Results  Component Value Date   HGBA1C 5.8* 04/03/2013    Urine Drug Screen:     Component Value Date/Time   LABOPIA NONE DETECTED 04/03/2013 0942   COCAINSCRNUR  NONE DETECTED 04/03/2013 0942   LABBENZ NONE DETECTED 04/03/2013 0942   AMPHETMU NONE DETECTED 04/03/2013 0942   THCU NONE DETECTED 04/03/2013 0942   LABBARB NONE DETECTED 04/03/2013 0942    Alcohol Level:  Recent Labs Lab 04/03/13 0855  ETH <11   CT of the brain   04/04/2013   1. Newly evident infarct of the right caudate head and anterior putamen. Likely 2nd focus of acute infarction involving the subcortical right parietal lobe. Multi focal, unilateral infarcts would favor thromboembolic disease from the right carotid system. 2. No complicating hemorrhage.    04/03/2013   No intracranial hemorrhage.  Small vessel disease type changes without CT evidence of large acute infarct. Streak artifact limits evaluation of the posterior fossa  CT angio of the head  MRI/A of the brain  pacer  2D Echocardiogram    Carotid Doppler  Bilateral internal carotid arteries demonstrate 40-59% internal carotid artery stenosis. Vertebral arteries are patent with antegrade flow.   CXR  04/03/2013   No active cardiopulmonary disease.   EKG  normal sinus rhythm.   Therapy Recommendations   Physical Exam   Pleasant elderly Caucasian male currently not in distress.Awake alert. Afebrile. Head is nontraumatic. Neck is supple without bruit. Hearing is normal. Cardiac exam no murmur or gallop. Lungs are clear to auscultation. Distal pulses are well felt. Neurological Exam :  Awake alert oriented x3 with normal speech and language function. No dysarthria or aphasia. Extraocular movements are full range without nystagmus. Mild left lower facial weakness. Tongue is midline. Motor system exam no upper or lower extremity drift but mild weakness of left hip flexors and ankle dorsiflexors 4/5. Significant weakness of left and intrinsic hand muscles and unable to bend fingers. No sensory loss. Gait was not tested. NIHSS 2 ASSESSMENT  Christopher Baker is a 79 y.o. male presenting with left hemiparesis-numbness,  left face weakness. Imaging confirms a right cortical and subcortical infarcts (right caudate head, right parietal lobe). Infarct felt to be  embolic secondary to hx PAF (not on anticoagulation per Dr. Marlis Edelson conversation with Dr. Ladona Ridgel due to inconsistent appts, refusal of anticoagulation).  On no documented antithrombotics prior to admission. Now on aspirin 325 mg orally every day for secondary stroke prevention. Patient with resultant left hemiparesis. Work up underway.  Hypertension Hyperlipidemia, LDL 88, on lipitor PTA, now on lipitor, goal LDL <  100 (< 70 for diabetics) Pacemaker 2011 Hx PAF CAD Family hx stroke - mother Current smoker  Hospital day # 1  TREATMENT/PLAN  Change aspirin to xarelto for secondary stroke prevention.  CT angio of the head to look at vasculature  Dr. Delton See called from [facility] cardiology. Consulting on pt. She agrees with Dr. Lubertha Basque recommendation for xarelto.  Annie Main, MSN, RN, ANVP-BC, ANP-BC, [facility] Pager: [phone] 04/04/2013 10:36 AM  I have personally obtained a history, examined the patient, evaluated imaging results, and formulated the assessment and plan of care. I agree with the above. Delia Heady, MD

## 2013-04-04 NOTE — Telephone Encounter (Signed)
FYI Pt had a stroke on 04/03/13. Pt at [facility].

## 2013-04-04 NOTE — Evaluation (Signed)
Physical Therapy Evaluation Patient Details Name: Christopher Baker MRN: [id number] DOB: 05/29/1934 Today's Date: 04/04/2013 Time: 4098-1191 PT Time Calculation (min): 34 min  PT Assessment / Plan / Recommendation History of Present Illness  79 y.o. male with Lt facial droop and s/p fall. Pt with hx of ypertension, hyperlipidemia, paroxysmal atrial fibrillation status post pacemaker (currently no receiving any anticoagulation therapy), GERD, psoriasis and history of carotid artery stenosis.CT  Newly evident infarct of the right caudate head and anterior putamen. Likely 2nd focus of acute infarction involving the subcortical right parietal lobe. Multi focal, unilateral infarcts would favor thromboembolic disease from the right carotid system.   Clinical Impression  Patient demonstrates deficits in functional mobility as indicated below. Patient with difficulty sustaining balance during ambulation. Additionally, patient with significant inattention to left side with activities (please see comments below).  This patient is an ideal candidate for intense continued inpatient rehabilitation prior to discharge home.  Family is highly supportive. Will continue to see as indicated, recommend CIR upon discharge.     PT Assessment  Patient needs continued PT services    Follow Up Recommendations  CIR    Does the patient have the potential to tolerate intense rehabilitation      Barriers to Discharge        Equipment Recommendations   (TBD)    Recommendations for Other Services Rehab consult   Frequency Min 3X/week    Precautions / Restrictions Precautions Precautions: Fall Precaution Comments: Lt inattention   Pertinent Vitals/Pain Patient reports 'dull' headache at this time      Mobility  Bed Mobility Bed Mobility: Sitting - Scoot to Edge of Bed;Sit to Supine Sitting - Scoot to Edge of Bed: 4: Min guard Sit to Supine: 4: Min guard Details for Bed Mobility Assistance: VCs required for  cues, multiple attempt by patient to reach sitting, pt unaware of LUE throughout movement with LUE getting caught in bed rail x2 despite tactile and VCs  Transfers Transfers: Sit to Stand;Stand to Sit Sit to Stand: 3: Mod assist;With upper extremity assist Stand to Sit: 3: Mod assist;With upper extremity assist Details for Transfer Assistance: Assist for stability upon standing and multimodal cues to attend to LUE  Ambulation/Gait Ambulation/Gait Assistance: 3: Mod assist Ambulation Distance (Feet): 240 Feet Assistive device: 1 person hand held assist Ambulation/Gait Assistance Details: patient with varying instability throughout with multiple LOBs and staggering gait, patient running into chairs, objects and walls on left side. During ambulation, patient given tasks to looks for room and navigate throughout halls. Patient unaware of any rooms/room numbers on his left side. Significant inattention causing need for multiple VCs and tactile cues.   Gait Pattern: Step-through pattern;Scissoring;Narrow base of support Gait velocity: decreased General Gait Details: very unsteady requires assist to prevent LOB and fall, patient not attending to left side making RW possiblity questionable        PT Diagnosis: Difficulty walking;Abnormality of gait  PT Problem List: Decreased strength;Decreased range of motion;Decreased activity tolerance;Decreased balance;Decreased mobility;Decreased coordination;Decreased cognition;Decreased knowledge of use of DME;Decreased safety awareness;Impaired sensation PT Treatment Interventions: DME instruction;Gait training;Stair training;Functional mobility training;Therapeutic activities;Therapeutic exercise;Balance training;Patient/family education     PT Goals(Current goals can be found in the care plan section) Acute Rehab PT Goals Patient Stated Goal: to be independent and high functioning, to play golf again PT Goal Formulation: With patient/family Time For Goal  Achievement: 04/18/13 Potential to Achieve Goals: Good  Visit Information  Last PT Received On: 04/04/13 Assistance Needed: +1 History of  Present Illness: 79 y.o. male with Lt facial droop and s/p fall. Pt with hx of ypertension, hyperlipidemia, paroxysmal atrial fibrillation status post pacemaker (currently no receiving any anticoagulation therapy), GERD, psoriasis and history of carotid artery stenosis.CT  Newly evident infarct of the right caudate head and anterior putamen. Likely 2nd focus of acute infarction involving the subcortical right parietal lobe. Multi focal, unilateral infarcts would favor thromboembolic disease from the right carotid system.        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 Wife and patient advised to have second person in charge start communicating on patients behalf at this time to help business run due to the need to have therapy. Communication Communication: No difficulties Dominant Hand: Right    Cognition  Cognition Arousal/Alertness: Awake/alert Behavior During Therapy: WFL for tasks assessed/performed Overall Cognitive Status: Impaired/Different from baseline Area of Impairment: Safety/judgement;Awareness Safety/Judgement: Decreased awareness of safety;Decreased awareness of deficits Awareness: Anticipatory General Comments: Pt unaware of Lt Ue deficits and only when directly asked  acknowledges Lt hand deficits. Pt with visual inattention to left side of his body. pt must look at left side to know where it is in space    Extremity/Trunk Assessment Upper Extremity Assessment Upper Extremity Assessment: LUE deficits/detail LUE Deficits / Details: AROM shoulder and elbow 3+ out 5, Pt with no active wrist extension currently. pt with hand in neutral position demonstrates Brunstrom II loose gross grasp. Pt is unable to hold RW at this time.  LUE Sensation: decreased light touch;decreased proprioception LUE Coordination: decreased fine motor;decreased gross motor Lower Extremity Assessment Lower Extremity Assessment: Defer to PT evaluation Cervical / Trunk Assessment Cervical / Trunk Assessment: Normal   Balance Balance Balance Assessed: Yes Static Sitting Balance Static Sitting - Balance Support: Feet unsupported Static Sitting - Level of Assistance: 5: Stand by assistance High Level Balance High Level Balance Activites: Side stepping;Backward walking;Direction changes;Turns;Head turns High Level Balance Comments: min guard to moderate assist with varying instability. Left inattention causing significant risk with ambulation.  End of Session PT - End of Session Equipment Utilized During Treatment: Gait belt Activity Tolerance: Patient tolerated treatment well Patient left: in chair;with call bell/phone within reach;with family/visitor present Nurse Communication: Mobility status  GP     Fabio Asa 04/04/2013, 2:21 PM Charlotte Crumb, PT DPT  [phone]

## 2013-04-04 NOTE — Progress Notes (Signed)
Pt alert/responsive in no acute distress, BP trending in 170's-180's/70's-80's, denies any pain/discomfort at this time.No nausea and vomiting noted. Attending MD aware with no new orders. Pt in bed resting comfortably with no complaints.

## 2013-04-04 NOTE — Progress Notes (Signed)
[facility] PROGRESS NOTE  Christopher Baker:[id number] DOB: 1934/06/20 DOA: 04/03/2013 PCP: Carrie Mew, MD  Assessment/Plan:  1-Left side weakness and left facial droop: with stroke risk factors significant for HTN, HLD, PAF -Patient out of therapeutic window for TPA  -Unable to performed MRI secondary to pacemaker implantation.  -CT head without contrast do not show any acute hemorrhagic stroke or acute abnormality.  - Neurology and Cardiology on board - Work up per neurology - Xarelto to be initiated per cardiology and neurology notes.  2-HYPERLIPIDEMIA:  - Continue statins - lipid panel reviewed.  3-HYPERTENSION: Currently stable. Will continue antihypertensive regimen and heart healthy diet.   4-Paroxysmal ATRIAL fibrillation: s/p pacemaker. Not using any anticoagulation.  - Cardiology consulted for further evaluation and recommendations - Xarelto on board.  5-EXTRINSIC ASTHMA, WITH EXACERBATION:  - stable  6-GERD: -Stable, continue PPI   7-psoriatic arthritis: The patient actively receiving methotrexate and prednisone as an outpatient.  -Will continue prednisone  -Currently no swelling of his joints and no pain.  Code Status: full Family Communication: discussed with family member at bedside Disposition Plan: Pending improvement in condition   Consultants:  Neurology   Cardiology  Procedures: CT of the brain  04/04/2013 1. Newly evident infarct of the right caudate head and anterior putamen. Likely 2nd focus of acute infarction involving the subcortical right parietal lobe. Multi focal, unilateral infarcts would favor thromboembolic disease from the right carotid system. 2. No complicating hemorrhage.  04/03/2013 No intracranial hemorrhage. Small vessel disease type changes without CT evidence of large acute infarct. Streak artifact limits evaluation of the posterior fossa  CT angio of the head  MRI/A of the brain pacer  2D Echocardiogram   Carotid Doppler Bilateral internal carotid arteries demonstrate 40-59% internal carotid artery stenosis. Vertebral arteries are patent with antegrade flow.  CXR 04/03/2013 No active cardiopulmonary disease.  EKG normal sinus rhythm.   Antibiotics:  None  HPI/Subjective: Patient has no new complaints. No acute issues reported overnight.  Objective: Filed Vitals:   04/04/13 1450  BP: 181/78  Pulse: 58  Temp: 98.1 F (36.7 C)  Resp: 18    Intake/Output Summary (Last 24 hours) at 04/04/13 1540 Last data filed at 04/04/13 1504  Gross per 24 hour  Intake      0 ml  Output    650 ml  Net   -650 ml   Filed Weights   04/03/13 1234  Weight: 102.286 kg (225 lb 8 oz)    Exam:   General:  Pt in NAD, Alert and awake  Cardiovascular: RRR, no MRG  Respiratory: CTA BL, no wheezes  Abdomen: soft, NT, ND  Musculoskeletal: no cyanosis or clubbing    Data Reviewed: Basic Metabolic Panel:  Recent Labs Lab 04/03/13 0855 04/03/13 0912 04/04/13 0530  NA 141 144 144  K 3.9 4.1 3.8  CL 106 106 110  CO2 27  --  24  GLUCOSE 106* 104* 104*  BUN 18 21 14   CREATININE 1.11 1.30 0.93  CALCIUM 9.0  --  8.9   Liver Function Tests:  Recent Labs Lab 04/03/13 0855  AST 23  ALT 27  ALKPHOS 87  BILITOT 0.2*  PROT 6.1  ALBUMIN 3.5   No results found for this basename: LIPASE, AMYLASE,  in the last 168 hours No results found for this basename: AMMONIA,  in the last 168 hours CBC:  Recent Labs Lab 04/03/13 0855 04/03/13 0912 04/04/13 0530  WBC 7.9  --  10.7*  NEUTROABS  5.9  --   --   HGB 15.2 15.3 14.6  HCT 43.6 45.0 42.5  MCV 94.0  --  93.8  PLT 198  --  196   Cardiac Enzymes: No results found for this basename: CKTOTAL, CKMB, CKMBINDEX, TROPONINI,  in the last 168 hours BNP (last 3 results) No results found for this basename: PROBNP,  in the last 8760 hours CBG:  Recent Labs Lab 04/03/13 0917  GLUCAP 104*    No results found for this or any previous  visit (from the past 240 hour(s)).   Studies: Dg Chest 2 View  04/03/2013   CLINICAL DATA:  Stroke. Weakness. Hypertension.  EXAM: CHEST  2 VIEW  COMPARISON:  None.  FINDINGS: The heart size and mediastinal contours are within normal limits. Both lungs are clear. No pleural effusion or pneumothorax. Left anterior chest wall sequential pacemaker is stable in well positioned. The bony thorax is demineralized but intact.  IMPRESSION: No active cardiopulmonary disease.   Electronically Signed   By: Amie Portland M.D.   On: 04/03/2013 13:32   Ct Head Wo Contrast  04/04/2013   CLINICAL DATA:  Followup.  EXAM: CT HEAD WITHOUT CONTRAST  TECHNIQUE: Contiguous axial images were obtained from the base of the skull through the vertex without intravenous contrast.  COMPARISON:  04/03/2013  FINDINGS: No acute osseous findings. Mild inflammatory mucosal thickening in the paranasal sinuses. Opacified, expanded air cell in the posterior left mastoid region.  Bilateral proptosis related to increase in retro-orbital fat.  Newly seen low-attenuation in the right caudate head, anterior right putamen, and intervening internal capsule. There is new subcortical low-attenuation in the right parietal region, 2 cm in maximal span. No contralateral ischemia detected. No hemorrhagic complication.  Critical Value/emergent results were called by telephone at the time of interpretation on 04/04/2013 at 6:39 AM to [facility], who verbally acknowledged these results.  IMPRESSION: 1. Newly evident infarct of the right caudate head and anterior putamen. Likely 2nd focus of acute infarction involving the subcortical right parietal lobe. Multi focal, unilateral infarcts would favor thromboembolic disease from the right carotid system. 2. No complicating hemorrhage.   Electronically Signed   By: Tiburcio Pea M.D.   On: 04/04/2013 06:49   Ct Head Wo Contrast  04/03/2013   CLINICAL DATA:  Left-sided weakness.  EXAM: CT HEAD WITHOUT CONTRAST   TECHNIQUE: Contiguous axial images were obtained from the base of the skull through the vertex without intravenous contrast.  COMPARISON:  09/10/2010 head CT.  FINDINGS: No intracranial hemorrhage.  Small vessel disease type changes without CT evidence of large acute infarct. Streak artifact limits evaluation of the posterior fossa.  No hydrocephalus.  No intracranial mass lesion noted on this unenhanced exam.  Vascular calcifications. Minimal to mild paranasal sinus mucosal thickening.  Mild exophthalmos.  IMPRESSION: No intracranial hemorrhage.  Small vessel disease type changes without CT evidence of large acute infarct. Streak artifact limits evaluation of the posterior fossa.   Electronically Signed   By: Bridgett Larsson M.D.   On: 04/03/2013 09:39    Scheduled Meds: . [START ON 04/05/2013] amLODipine  10 mg Oral Daily  . atorvastatin  20 mg Oral q1800  . citalopram  20 mg Oral Daily  . darifenacin  7.5 mg Oral Daily  . ezetimibe  10 mg Oral Daily  . influenza vac split quadrivalent PF  0.5 mL Intramuscular Tomorrow-1000  . iron polysaccharides  150 mg Oral BID  . mirabegron ER  50 mg Oral  Daily  . oxybutynin  5 mg Oral BID  . pantoprazole  40 mg Oral BID  . predniSONE  5 mg Oral Q breakfast  . rivaroxaban  20 mg Oral Daily  . rOPINIRole  2 mg Oral BID   Continuous Infusions:   Principal Problem:   CVA (cerebral infarction) Active Problems:   HYPERLIPIDEMIA   HYPERTENSION   FIBRILLATION, ATRIAL   EXTRINSIC ASTHMA, WITH EXACERBATION   GERD    Time spent: > 35 minutes    Penny Pia  [facility] Pager [phone]. If 7PM-7AM, please contact night-coverage at [email or web address], password [email or web address] 04/04/2013, 3:40 PM  LOS: 1 day

## 2013-04-04 NOTE — Progress Notes (Signed)
Rehab Admissions Coordinator Note:  Patient was screened by Clois Dupes for appropriateness for an Inpatient Acute Rehab Consult.  At this time, we are recommending Inpatient Rehab consult. I will contact Dr. Cena Benton.  Clois Dupes 04/04/2013, 2:28 PM  I can be reached at [phone].

## 2013-04-04 NOTE — Evaluation (Signed)
Occupational Therapy Evaluation Patient Details Name: Christopher Baker MRN: [id number] DOB: 09-24-1933 Today's Date: 04/04/2013 Time: 4098-1191 OT Time Calculation (min): 34 min  OT Assessment / Plan / Recommendation History of present illness 79 y.o. male with Lt facial droop and s/p fall. Pt with hx of ypertension, hyperlipidemia, paroxysmal atrial fibrillation status post pacemaker (currently no receiving any anticoagulation therapy), GERD, psoriasis and history of carotid artery stenosis.CT  Newly evident infarct of the right caudate head and anterior putamen. Likely 2nd focus of acute infarction involving the subcortical right parietal lobe. Multi focal, unilateral infarcts would favor thromboembolic disease from the right carotid system.    Clinical Impression   PT admitted with Lt facial droop and s/p fall per wife. Pt currently with functional limitiations due to the deficits listed below (see OT problem list). Pt falling with left UE pended underneath patient per wife. Pt demonstrates new sensation and ROM deficits in Lt UE. Pt with left inattention and lt peripheral deficits Pt will benefit from skilled OT to increase their independence and safety with adls and balance to allow discharge CIR.     OT Assessment  Patient needs continued OT Services    Follow Up Recommendations  CIR    Barriers to Discharge      Equipment Recommendations  3 in 1 bedside comode    Recommendations for Other Services Rehab consult;Other (comment) (follow up for left visual deficits with neuro ophthalmologis)  Frequency  Min 3X/week    Precautions / Restrictions Precautions Precautions: Fall Precaution Comments: Lt inattention   Pertinent Vitals/Pain None reported at this time    ADL  Eating/Feeding: Minimal assistance (wife states "he only ate the right side of his plate") Where Assessed - Eating/Feeding: Bed level Grooming: Wash/dry face;Min guard Where Assessed - Grooming: Supported  sitting Lower Body Dressing: Maximal assistance Where Assessed - Lower Body Dressing: Unsupported sit to stand Toilet Transfer: Moderate assistance Toilet Transfer Method: Sit to stand Toilet Transfer Equipment: Raised toilet seat with arms (or 3-in-1 over toilet) Toileting - Clothing Manipulation and Hygiene: Maximal assistance Where Assessed - Toileting Clothing Manipulation and Hygiene: Sit to stand from 3-in-1 or toilet Equipment Used: Gait belt Transfers/Ambulation Related to ADLs: Pt demonstrates balance deficits requiring correction from therapist for upright posture. Pt walking into objects on left side (door frame/ chairs) Pt unable to locate room on left side and ambulated to the end of the unit without awareness to passing room but could tell therapist my room is 28. Pt turning around with room on right side and located room immediately ADL Comments: Pt educated on safety with Lt hand. pt getting left hand caught in bed rail x2 prior to sit<.stand with pt completely unaware. PT with x6 bandages on left UE due to scratches/ cuts due to decr sensation. Pt visually asked to look at lt hand compared to right hand in midline. pt made aware of the incr number of injuries to left hand already. Pt and wife asking what exercises can be completed with LT hand. Pt encouraged for weight bearing with transfers with lt hand in proper alignment and bil UE gross open / close grasp at this time. Pt with good return of elbow and shoulder AROm at this time. pt verbalized feeling ready to drive a car after ambulation/ lob with ambulation and visual assessment. pt and wife educated that no driving can occur until the MD clears patient to drive.     OT Diagnosis: Generalized weakness;Cognitive deficits;Disturbance of vision  OT  Problem List: Decreased strength;Decreased activity tolerance;Impaired balance (sitting and/or standing);Impaired vision/perception;Decreased coordination;Decreased cognition;Decreased  safety awareness;Decreased knowledge of use of DME or AE;Decreased knowledge of precautions;Impaired sensation;Impaired UE functional use OT Treatment Interventions: Self-care/ADL training;Therapeutic exercise;Neuromuscular education;DME and/or AE instruction;Therapeutic activities;Cognitive remediation/compensation;Visual/perceptual remediation/compensation;Patient/family education;Balance training   OT Goals(Current goals can be found in the care plan section) Acute Rehab OT Goals Patient Stated Goal: to be independent and high functioning, to play golf again OT Goal Formulation: With patient/family Time For Goal Achievement: 04/18/13 Potential to Achieve Goals: Good  Visit Information  Last OT Received On: 04/04/13 Assistance Needed: +1 History of Present Illness: 79 y.o. male with Lt facial droop and s/p fall. Pt with hx of ypertension, hyperlipidemia, paroxysmal atrial fibrillation status post pacemaker (currently no receiving any anticoagulation therapy), GERD, psoriasis and history of carotid artery stenosis.CT  Newly evident infarct of the right caudate head and anterior putamen. Likely 2nd focus of acute infarction involving the subcortical right parietal lobe. Multi focal, unilateral infarcts would favor thromboembolic disease from the right carotid system.        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 Wife and  patient advised to have second person in charge start communicating on patients behalf at this time to help business run due to the need to have therapy. Communication Communication: No difficulties Dominant Hand: Right         Vision/Perception Vision - History Baseline Vision: Wears glasses all the time Patient Visual Report: Peripheral vision impairment Vision - Assessment Eye Alignment: Within Functional Limits Vision Assessment: Vision tested Ocular Range of Motion: Restricted on the left;Impaired-to be further tested in functional context Tracking/Visual Pursuits: Decreased smoothness of eye movement to LEFT superior field;Left eye does not track medially;Impaired - to be further tested in functional context Convergence: Impaired (comment) Visual Fields: Left visual field deficit;Impaired - to be further tested in functional context Additional Comments: Wife reports "he was only eating things on the right side of his plate. When I told him to eat his sauage he said where? I had to turn the plate for him to see it" Pt with all plates and food positioned on his right side at lunch. Pt with left inattention. pt unable to track object with lt eye in superior and inferior fields with Rt eye occluded. pt demonstrates LT eye deficits. Pt with noted nystagmus with end range testing   Cognition  Cognition Arousal/Alertness: Awake/alert Behavior During Therapy: WFL for tasks assessed/performed Overall Cognitive Status: Impaired/Different from baseline Area of Impairment: Safety/judgement;Awareness Safety/Judgement: Decreased awareness of safety;Decreased awareness of deficits Awareness: Anticipatory General Comments: Pt unaware of Lt Ue deficits and only when directly asked acknowledges Lt hand deficits. Pt with visual inattention to left side of his body. pt must look at left side to know where it is in space    Extremity/Trunk Assessment Upper Extremity Assessment Upper Extremity  Assessment: LUE deficits/detail LUE Deficits / Details: AROM shoulder and elbow 3+ out 5, Pt with no active wrist extension currently. pt with hand in neutral  position demonstrates Brunstrom II loose gross grasp. Pt is unable to hold RW at this time.  LUE Sensation: decreased light touch;decreased proprioception LUE Coordination: decreased fine motor;decreased gross motor Lower Extremity Assessment Lower Extremity Assessment: Defer to PT evaluation Cervical / Trunk Assessment Cervical / Trunk Assessment: Normal     Mobility Bed Mobility Bed Mobility: Sitting - Scoot to Edge of Bed;Sit to Supine Sitting - Scoot to Edge of Bed: 4: Min guard Sit to Supine: 4: Min guard Details for Bed Mobility Assistance: VCs required for cues, multiple attempt by patient to reach sitting, pt unaware of LUE throughout movement with LUE getting caught in bed rail x2 despite tactile and VCs  Transfers Transfers: Sit to Stand;Stand to Sit Sit to Stand: 3: Mod assist;With upper extremity assist Stand to Sit: 3: Mod assist;With upper extremity assist Details for Transfer Assistance: Assist for stability upon standing and multimodal cues to attend to LUE      Exercise     Balance Balance Balance Assessed: Yes Static Sitting Balance Static Sitting - Balance Support: Feet unsupported Static Sitting - Level of Assistance: 5: Stand by assistance High Level Balance High Level Balance Activites: Side stepping;Backward walking;Direction changes;Turns;Head turns High Level Balance Comments: min guard to moderate assist with varying instability. Left inattention causing significant risk with ambulation.   End of Session OT - End of Session Activity Tolerance: Patient tolerated treatment well Patient left: with call bell/phone within reach;in chair;with family/visitor present Nurse Communication: Mobility status;Precautions  GO     Harolyn Rutherford 04/04/2013, 2:22 PM Pager: [phone]

## 2013-04-04 NOTE — Progress Notes (Signed)
ANTICOAGULATION CONSULT NOTE - Initial Consult  Pharmacy Consult for Xarelto Indication: atrial fibrillation nonvalvular-stroke prophylaxis  Allergies  Allergen Reactions  . Ciprofloxacin Nausea Only    REACTION: GI upset  . Other     SCOLLOPS--SUDDEN GI ATTACK    Patient Measurements: Height: 6\' 3"  (190.5 cm) Weight: 225 lb 8 oz (102.286 kg) IBW/kg (Calculated) : 84.5  Vital Signs: Temp: 98.1 F (36.7 C) (10/15 1450) Temp src: Oral (10/15 1450) BP: 170/80 mmHg (10/15 1543) Pulse Rate: 58 (10/15 1450)  Labs:  Recent Labs  04/03/13 0855 04/03/13 0912 04/04/13 0530  HGB 15.2 15.3 14.6  HCT 43.6 45.0 42.5  PLT 198  --  196  APTT 23*  --   --   LABPROT 12.5  --   --   INR 0.95  --   --   CREATININE 1.11 1.30 0.93    Estimated Creatinine Clearance: 86.2 ml/min (by C-G formula based on Cr of 0.93).   Medical History: Past Medical History  Diagnosis Date  . Hyperlipidemia   . HTN (hypertension)   . Status post placement of cardiac pacemaker DDD-  06-04-10-- LAST CHECK 09-08-10 IN EPIC    SECONDARY TO SYNCOPY AND BRADYCARDIA  . Restless leg syndrome   . Normal nuclear stress test 2008    LOW RISK--   DR  WALL  . Carotid stenosis, bilateral PER DR EARLY / DUPLEX  09-09-10      40 - 59%   BILATERALLY--- ASYMPTOMATIC  . History of echocardiogram 11-21-2006    EF 65%  . History of prostate cancer S/P RADIATION  TX  6 YRS AGO  . Psoriasis   . Arthritis with psoriasis   . GERD (gastroesophageal reflux disease)     CONTROLLED W/ PROTONIX  . Borderline diabetes     DIET CONTROLLED - PT STATES HE IS NOT DIABETIC  . Lumbar spondylosis W/ RADICULOPATHY  . Iron deficiency   . Cataract immature LEFT EYE  . PAF (paroxysmal atrial fibrillation)   . SVT (supraventricular tachycardia)     S/P ABLATION   2008  . Coronary artery disease CARDIOLOGIST- DR Sharrell Ku    09-08-10 VISIT AND PACEMAKER CHECK  IN EPIC  . Cancer     HX OF PROSTATE CANCER--TX'D WITH RADIATION   . Sleep apnea TESTED YRS AGO-- NO CPAP RX GIVEN    PT STATES HE DOES NOT THINK HE STILL HAS SLEEP APNEA--DOES NOT USE CPAP    Medications:  Prescriptions prior to admission  Medication Sig Dispense Refill  . amLODipine (NORVASC) 5 MG tablet Take 5 mg by mouth daily.      Marland Kitchen atorvastatin (LIPITOR) 20 MG tablet Take 20 mg by mouth daily. TAKE ONE TABLET BY MOUTH DAILY      . citalopram (CELEXA) 20 MG tablet Take 1 tablet (20 mg total) by mouth daily.  30 tablet  3  . clotrimazole-betamethasone (LOTRISONE) cream Apply 1 application topically 2 (two) times daily. For psoriasis.      Marland Kitchen diclofenac sodium (VOLTAREN) 1 % GEL Apply 1 application topically 2 (two) times daily as needed. Arthritis in both thumbs.      . esomeprazole (NEXIUM) 40 MG capsule Take 1 capsule (40 mg total) by mouth 2 (two) times daily.  60 capsule  3  . ezetimibe (ZETIA) 10 MG tablet Take 10 mg by mouth daily.      . iron polysaccharides (NIFEREX) 150 MG capsule Take 150 mg by mouth 2 (two) times daily.      Marland Kitchen  methotrexate (RHEUMATREX) 2.5 MG tablet TAKE FOUR TABLETS BY MOUTH ONCE A WEEK.  16 tablet  10  . methotrexate (RHEUMATREX) 2.5 MG tablet Take 10 mg by mouth once a week. Caution:Chemotherapy. Protect from light. Pt takes 4 tablets on Monday of each week      . MYRBETRIQ 50 MG TB24 tablet Take 50 mg by mouth daily.       . nabumetone (RELAFEN) 750 MG tablet TAKE TWO TABLETS DAILY  60 tablet  5  . nabumetone (RELAFEN) 750 MG tablet Take 750 mg by mouth 2 (two) times daily.      . OXYBUTYNIN CHLORIDE PO Take 5 mg by mouth 2 (two) times daily.       . predniSONE (DELTASONE) 5 MG tablet Take 5 mg by mouth daily.      Marland Kitchen rOPINIRole (REQUIP) 2 MG tablet Take 2 mg by mouth at bedtime.      Marland Kitchen rOPINIRole (REQUIP) 2 MG tablet Take 2 mg by mouth 2 (two) times daily.      Marland Kitchen triamcinolone cream (KENALOG) 0.1 % APPLY TO AFFECTED AREAS AS DIRECTED  480 g  3  . VESICARE 10 MG tablet Take 10 mg by mouth daily.       . Cyanocobalamin  500 MCG/0.1ML SOLN Place 500 mcg into the nose once a week.       Scheduled:  . [START ON 04/05/2013] amLODipine  10 mg Oral Daily  . atorvastatin  20 mg Oral q1800  . citalopram  20 mg Oral Daily  . darifenacin  7.5 mg Oral Daily  . ezetimibe  10 mg Oral Daily  . iron polysaccharides  150 mg Oral BID  . mirabegron ER  50 mg Oral Daily  . oxybutynin  5 mg Oral BID  . pantoprazole  40 mg Oral BID  . predniSONE  5 mg Oral Q breakfast  . rivaroxaban  20 mg Oral Q supper  . rOPINIRole  2 mg Oral BID    Assessment: 79 y.o male with CVA felt to be embolic secondary to history of PAF.  Patient was not on anticoagulation prior to admission.  He has received SQ heparin started 04/03/13 which has now been discontinued.  Last SQ heparin dose given @05 :53.  Aspirin discontinued and now to start on Xarelto for stroke prevention.  His SCr is 0.93 and estimated CrCl is 86 ml/min. LFTs within normal and no history of hepatic impairment associated coagulopathy.   Goal of Therapy:  Monitor platelets by anticoagulation protocol: Yes   Plan:  Neurologist NP, Christopher Baker, has already ordered Xarelto 20 mg po daily with supper to start today.  Dose is appropriate.  Monitor for signs and symptoms of bleeding.    Christopher Baker, RPh Clinical Pharmacist Pager: [phone] 04/04/2013,4:18 PM

## 2013-04-05 DIAGNOSIS — L02419 Cutaneous abscess of limb, unspecified: Secondary | ICD-10-CM

## 2013-04-05 MED ORDER — CYANOCOBALAMIN 1000 MCG/ML IJ SOLN
1000.0000 ug | Freq: Once | INTRAMUSCULAR | Status: AC
  Administered 2013-04-05: 1000 ug via INTRAMUSCULAR
  Filled 2013-04-05: qty 1

## 2013-04-05 NOTE — Progress Notes (Signed)
I met with pt and a friend at bedside. Discussed inpt rehab admission and he is in agreement. I will begin insurance approval with [facility] for hopeful admission tomorrow. [phone]

## 2013-04-05 NOTE — Progress Notes (Signed)
Stroke Team Progress Note  HISTORY Christopher Baker is an 79 y.o. male, right handed, with a past medical history significant for HTN, hyperlipidemia, atrial fibrilation s/p pacemaker placement, bilateral carotid stenosis (80% according to wife), brought to [facility] ED 04/03/2013 for evaluation of new onset symptoms. He said that he went to bed last night feeling well, but when she tried to get up the bed around 630 this morning " something was wrong with my left side, kind of numb and weak" and when he finally got out the bed went to the kitchen and tried to open a can of cat food just to realized that " my hand was not really working well". He tells me that he fell and had to struggle to get up. Furthermore, he said that around 7 or 730 am his wife noticed that the left side of his face was droopier than the right side. Never had similar symptoms before.  No HA, vertigo, double vision, difficulty swallowing, slurred speech, language or vision impairment. CT brain showed no acute abnormality.  Patient was not a TPA candidate secondary to waking with symptoms. He was admitted for further evaluation and treatment.  SUBJECTIVE His wife's representative is at the beside as she could not be here today.  OBJECTIVE Most recent Vital Signs: Filed Vitals:   04/04/13 2147 04/05/13 0247 04/05/13 0604 04/05/13 0944  BP: 163/76 158/89 175/71 163/79  Pulse: 76 67 62 68  Temp: 98.8 F (37.1 C) 98.4 F (36.9 C) 98.5 F (36.9 C) 98.6 F (37 C)  TempSrc: Oral Oral Oral Oral  Resp: 18 18 18 18   Height:      Weight:      SpO2: 94% 94% 94% 93%   CBG (last 3)   Recent Labs  04/03/13 0917  GLUCAP 104*    IV Fluid Intake:     MEDICATIONS  . amLODipine  10 mg Oral Daily  . atorvastatin  20 mg Oral q1800  . citalopram  20 mg Oral Daily  . darifenacin  7.5 mg Oral Daily  . ezetimibe  10 mg Oral Daily  . iron polysaccharides  150 mg Oral BID  . mirabegron ER  50 mg Oral Daily  . oxybutynin  5 mg Oral BID   . pantoprazole  40 mg Oral BID  . predniSONE  5 mg Oral Q breakfast  . rivaroxaban  20 mg Oral Q supper  . rOPINIRole  2 mg Oral BID   PRN:  acetaminophen, diclofenac sodium  Diet:  Cardiac thin liquids Activity:    Up with assistance DVT Prophylaxis:  Heparin 5000 units sq tid   CLINICALLY SIGNIFICANT STUDIES Basic Metabolic Panel:   Recent Labs Lab 04/03/13 0855 04/03/13 0912 04/04/13 0530  NA 141 144 144  K 3.9 4.1 3.8  CL 106 106 110  CO2 27  --  24  GLUCOSE 106* 104* 104*  BUN 18 21 14   CREATININE 1.11 1.30 0.93  CALCIUM 9.0  --  8.9   Liver Function Tests:   Recent Labs Lab 04/03/13 0855  AST 23  ALT 27  ALKPHOS 87  BILITOT 0.2*  PROT 6.1  ALBUMIN 3.5   CBC:   Recent Labs Lab 04/03/13 0855 04/03/13 0912 04/04/13 0530  WBC 7.9  --  10.7*  NEUTROABS 5.9  --   --   HGB 15.2 15.3 14.6  HCT 43.6 45.0 42.5  MCV 94.0  --  93.8  PLT 198  --  196   Coagulation:  Recent Labs Lab 04/03/13 0855  LABPROT 12.5  INR 0.95   Cardiac Enzymes: No results found for this basename: CKTOTAL, CKMB, CKMBINDEX, TROPONINI,  in the last 168 hours Urinalysis:   Recent Labs Lab 04/03/13 0942  COLORURINE YELLOW  LABSPEC 1.015  PHURINE 6.5  GLUCOSEU NEGATIVE  HGBUR NEGATIVE  BILIRUBINUR NEGATIVE  KETONESUR NEGATIVE  PROTEINUR NEGATIVE  UROBILINOGEN 0.2  NITRITE NEGATIVE  LEUKOCYTESUR NEGATIVE   Lipid Panel    Component Value Date/Time   CHOL 169 04/04/2013 0530   TRIG 157* 04/04/2013 0530   HDL 50 04/04/2013 0530   CHOLHDL 3.4 04/04/2013 0530   VLDL 31 04/04/2013 0530   LDLCALC 88 04/04/2013 0530   HgbA1C  Lab Results  Component Value Date   HGBA1C 5.5 04/04/2013    Urine Drug Screen:     Component Value Date/Time   LABOPIA NONE DETECTED 04/03/2013 0942   COCAINSCRNUR NONE DETECTED 04/03/2013 0942   LABBENZ NONE DETECTED 04/03/2013 0942   AMPHETMU NONE DETECTED 04/03/2013 0942   THCU NONE DETECTED 04/03/2013 0942   LABBARB NONE DETECTED  04/03/2013 0942    Alcohol Level:   Recent Labs Lab 04/03/13 0855  ETH <11   CT of the brain   04/04/2013   1. Newly evident infarct of the right caudate head and anterior putamen. Likely 2nd focus of acute infarction involving the subcortical right parietal lobe. Multi focal, unilateral infarcts would favor thromboembolic disease from the right carotid system. 2. No complicating hemorrhage.    04/03/2013   No intracranial hemorrhage.  Small vessel disease type changes without CT evidence of large acute infarct. Streak artifact limits evaluation of the posterior fossa  CT angio of the head 1. Stable right MCA territory infarcts with cytotoxic edema in the right basal ganglia and involving a small area of the right parietal lobe. No associated mass effect or hemorrhage. 2. No right MCA stenosis or major branch occlusion identified. Negative anterior circulation except for bilateral ICA siphon  calcified plaque without hemodynamically significant stenosis. 3. Negative posterior circulation.  MRI/A of the brain  pacer  2D Echocardiogram  EF 50-55% with no source of embolus.   Carotid Doppler The right internal carotid artery demonstrates elevated peak systolic velocities suggestive of 60-79% stenosis, elevated end diastolic velocities suggestive of borderline 40-59% stenosis, and ICA/CCA ratio suggestive of borderline 80-99% stenosis.  The left internal carotid artery demonstrates elevated peak systolic velocities suggestive of 40-59% stenosis, elevated end diastolic velocities suggestive of borderline 40-59% stenosis, an elevated ICA/CCA ratio suggestive of 60-79% stenosis.  This report is different from the preliminary report. He has had previous carotid dopplers, all followed by Dr. Arbie Cookey.  CXR  04/03/2013   No active cardiopulmonary disease.   EKG  normal sinus rhythm.   Therapy Recommendations CIR  Physical Exam   Pleasant elderly Caucasian male currently not in  distress.Awake alert. Afebrile. Head is nontraumatic. Neck is supple without bruit. Hearing is normal. Cardiac exam no murmur or gallop. Lungs are clear to auscultation. Distal pulses are well felt. Neurological Exam :  Awake alert oriented x3 with normal speech and language function. No dysarthria or aphasia. Extraocular movements are full range without nystagmus. Mild left lower facial weakness. Tongue is midline. Motor system exam no upper or lower extremity drift but mild weakness of left hip flexors and ankle dorsiflexors 4/5. Significant weakness of left and intrinsic hand muscles and unable to bend fingers. No sensory loss. Gait was not tested. NIHSS 2  ASSESSMENT  Mr.  Christopher Baker is a 79 y.o. male presenting with left hemiparesis-numbness, left face weakness. Imaging confirms a right cortical and subcortical infarcts (right caudate head, right parietal lobe). Infarct felt to be  embolic secondary to hx PAF (not on anticoagulation per Dr. Marlis Edelson conversation with Dr. Ladona Ridgel due to inconsistent appts, refusal of anticoagulation).  On no documented antithrombotics prior to admission. Now on xarelto for secondary stroke prevention. Patient with resultant left hemiparesis. Work up completed  Hypertension Hyperlipidemia, LDL 88, on lipitor PTA, now on lipitor, goal LDL < 100 (< 70 for diabetics) Pacemaker 2011 Hx PAF CAD Family hx stroke - mother Current smoker Right greater than left ICA stenosis - pt followed by Dr. Arbie Cookey prior to admission. Last doppler done in June. Preliminary carotid doppler reading showed bilat 40-60% stenosis. Formal reading showed right ICA could be 60-80% vs 40-60% when looking at velocities.   Hospital day # 2  TREATMENT/PLAN  Continue  xarelto for secondary stroke prevention.  Recommend [facility] evaluate current doppler reports and compare with previously done reports, either as an IP or OP. If increased in right ICA stenosis, may need to consider CEA post  rehab  Rehab when bed available and insurance approval obtained.  Dr. Pearlean Brownie discussed diagnosis, prognosis,  treatment options and plan of care with lady at bedside, pt and Dr. Cena Benton.   Annie Main, MSN, RN, ANVP-BC, ANP-BC, [facility] Pager: [phone] 04/05/2013 11:28 AM  I have personally obtained a history, examined the patient, evaluated imaging results, and formulated the assessment and plan of care. I agree with the above.  Delia Heady, MD

## 2013-04-05 NOTE — Progress Notes (Signed)
Occupational Therapy Treatment Patient Details Name: Christopher Baker MRN: [id number] DOB: June 06, 1934 Today's Date: 04/05/2013 Time: 7829-5621 OT Time Calculation (min): 49 min  OT Assessment / Plan / Recommendation  History of present illness 79 y.o. male with Lt facial droop and s/p fall. Pt with hx of ypertension, hyperlipidemia, paroxysmal atrial fibrillation status post pacemaker (currently no receiving any anticoagulation therapy), GERD, psoriasis and history of carotid artery stenosis.CT  Newly evident infarct of the right caudate head and anterior putamen. Likely 2nd focus of acute infarction involving the subcortical right parietal lobe. Multi focal, unilateral infarcts would favor thromboembolic disease from the right carotid system.    OT comments  Pt progressing toward OT goals and demonstrates AROM LT wrist this session. Pt remains with left inattention and needing cues to attend left side. Pt remains strong CIR candidate at this time.  Follow Up Recommendations  CIR    Barriers to Discharge       Equipment Recommendations  3 in 1 bedside comode    Recommendations for Other Services Rehab consult;Other (comment)  Frequency Min 3X/week   Progress towards OT Goals Progress towards OT goals: Progressing toward goals  Plan Discharge plan remains appropriate    Precautions / Restrictions Precautions Precautions: Fall Precaution Comments: Lt inattention   Pertinent Vitals/Pain Reports soreness in left knee. Pt feel on bil knees at home prior to admission. Bruise noted and declined ice at this time    ADL  Grooming: Wash/dry hands;Wash/dry face;Teeth care;Minimal assistance Where Assessed - Grooming: Unsupported standing Upper Body Bathing: Chest;Right arm;Left arm;Abdomen;Minimal assistance Where Assessed - Upper Body Bathing: Unsupported sitting (relies on UE on counter for support) Lower Body Bathing: Minimal assistance Where Assessed - Lower Body Bathing: Supported  standing Upper Body Dressing: Minimal assistance Where Assessed - Upper Body Dressing: Supported sit to stand Lower Body Dressing: Moderate assistance Where Assessed - Lower Body Dressing: Supported sit to Pharmacist, hospital: Moderate assistance Toilet Transfer Method: Sit to stand Toilet Transfer Equipment: Regular height toilet;Grab bars Equipment Used: Gait belt Transfers/Ambulation Related to ADLs: Pt demonstrates decr gait velocity and needs v/c for Lt inattention.pt needs cues to turn head to scan to the left to prevent walking into doors / door ways ADL Comments: Pt completed adl at sink level this AM. pt educated prior to ambulation to the bathroom that all necessary items are in the bathroom. He should not have to ask for any items. He will have to find the items. PT expressed understanding. Pt needed BIL socks half don and (A) to pull sock over the heels. Pt unsteady sit<>stand Min (A). pt ambulated to bathroom. pt neglecting all objects on the left. Pt knocking object off and hearing it hit. pt state "what was that?" Pt unaware he had knocked his bathroom kit off the counter with left hand. pt with a new cut on left UE this session from injury after yesterdays education. pt demonstrates AROM wrist this session wtih gravity eliminated. Pt remains without digit flexion/ extension. Pt and wife very excited for wrist AROM. Pt provided comb to brush hair. Pt asked "how am I suppose to see?" Pt cued there is a mirror in the room. Pt laughed and said "its on the left isnt it" Pt verbalizing this statement demonstrates incr awareness of left side from education. This carry over is why patient is an excellent CIR candidate. Pt reaching for objects with Lt hand and unable to hold / grasp. Pt educated on using Lt UE stablized on  sink counter to hold tooth brush and apply paste with Rt UE. pt very pleased with being able to initiate task with increaseing independence. pt dropping objects throughout  session with multiple attempts. Pt progressing with OT . Pt with new bandages applied to Lt hand wounds. WIfe and patient educated that RN may need to redress sites later today or tomorrow if needed. Pt with skin tear upper arm that requires large bandage and very long skin tear at 3rd 4th 5th MCP.     OT Diagnosis:    OT Problem List:   OT Treatment Interventions:     OT Goals(current goals can now be found in the care plan section) Acute Rehab OT Goals Patient Stated Goal: to be independent and high functioning, to play golf again OT Goal Formulation: With patient/family Time For Goal Achievement: 04/18/13 Potential to Achieve Goals: Good ADL Goals Pt Will Perform Grooming: with min assist;standing Pt Will Perform Upper Body Bathing: with min assist;standing Pt Will Perform Lower Body Bathing: with min assist;sit to/from stand Pt Will Transfer to Toilet: with min assist;bedside commode Additional ADL Goal #1: Pt will locate all objects on tray using compensatory strategies without verbal cues or moving objects to the right side of the tray Additional ADL Goal #2: Pt will demonstrates LT hand HEP with wife MOD I  Visit Information  Last OT Received On: 04/05/13 Assistance Needed: +1 History of Present Illness: 79 y.o. male with Lt facial droop and s/p fall. Pt with hx of ypertension, hyperlipidemia, paroxysmal atrial fibrillation status post pacemaker (currently no receiving any anticoagulation therapy), GERD, psoriasis and history of carotid artery stenosis.CT  Newly evident infarct of the right caudate head and anterior putamen. Likely 2nd focus of acute infarction involving the subcortical right parietal lobe. Multi focal, unilateral infarcts would favor thromboembolic disease from the right carotid system.     Subjective Data      Prior Functioning       Cognition  Cognition Arousal/Alertness: Awake/alert Behavior During Therapy: WFL for tasks assessed/performed Overall  Cognitive Status: Impaired/Different from baseline Area of Impairment: Safety/judgement;Awareness Safety/Judgement: Decreased awareness of safety;Decreased awareness of deficits Awareness: Anticipatory General Comments: Pt with left inattention and demonstrates some increase awareness. Pt remains with v/c to attention left. Pt asking wife and other visitors to sit on the left to him help turn his head. Pt with good carry through from education provided in evaluation.    Mobility  Bed Mobility Bed Mobility: Sitting - Scoot to Edge of Bed;Sit to Supine;Supine to Sit Supine to Sit: 4: Min guard;With rails;HOB elevated Sitting - Scoot to Edge of Bed: 4: Min guard Details for Bed Mobility Assistance: pt relies heavily on bed rail and exiting the bed on the right due to left UE deficits.  Transfers Transfers: Sit to Stand;Stand to Sit Sit to Stand: 4: Min assist;With upper extremity assist;From bed Stand to Sit: 4: Min assist;With upper extremity assist;To bed Details for Transfer Assistance: cues for hand placement. wife asking questions to further understand hand placement. Due to cuts on hand pt should use a open palm position. Pt is unable to make a fist to push into the surface safely    Exercises      Balance     End of Session OT - End of Session Activity Tolerance: Patient tolerated treatment well Patient left: in bed;with call bell/phone within reach;with bed alarm set;with family/visitor present Nurse Communication: Mobility status;Precautions  GO     Harolyn Rutherford 04/05/2013, 9:58  AM Pager: [phone]

## 2013-04-05 NOTE — Consult Note (Signed)
Physical Medicine and Rehabilitation Consult Reason for Consult: CVA Referring Physician: [facility]   HPI: Christopher Baker is a 79 y.o. right-handed male with history of hypertension,paroxysmal atrial fibrillation with pacemaker. Patient on no anti-coagulation prior to admission secondary to inconsistent appointments and refused with anticoagulation.. Admitted 04/03/2013 with left-sided weakness and facial droop. Cranial CT scan showed right cortical and subcortical infarcts(right caudate head, right parietal lobe). Infarct felt to be embolic secondary to history of PAF. MRI not completed secondary to pacemaker. Carotid Doppler showed bilateral ICA stenosis 40-59%. Echocardiogram with ejection fraction 55% grade 1 diastolic dysfunction no wall motion abnormalities. CT angiogram head negative for posterior circulation without stenosis or major occlusion. Patient did not receive TPA. Neurology service followup recommendations of Xarelto for CVA prophylaxis. Patient is maintained on a regular diet. Patient maintained on chronic prednisone therapy for history of psoriatic arthritis as well as methotrexate. Physical and occupational therapy evaluations completed an ongoing with recommendations for physical medicine rehabilitation consult to consider inpatient rehabilitation services.   Review of Systems  Cardiovascular: Positive for palpitations.  Gastrointestinal:       GERD  Musculoskeletal: Positive for myalgias.  Psychiatric/Behavioral: Positive for depression.  All other systems reviewed and are negative.   Past Medical History  Diagnosis Date  . Hyperlipidemia   . HTN (hypertension)   . Status post placement of cardiac pacemaker DDD-  06-04-10-- LAST CHECK 09-08-10 IN EPIC    SECONDARY TO SYNCOPY AND BRADYCARDIA  . Restless leg syndrome   . Normal nuclear stress test 2008    LOW RISK--   DR  WALL  . Carotid stenosis, bilateral PER DR EARLY / DUPLEX  09-09-10      40 - 59%   BILATERALLY---  ASYMPTOMATIC  . History of echocardiogram 11-21-2006    EF 65%  . History of prostate cancer S/P RADIATION  TX  6 YRS AGO  . Psoriasis   . Arthritis with psoriasis   . GERD (gastroesophageal reflux disease)     CONTROLLED W/ PROTONIX  . Borderline diabetes     DIET CONTROLLED - PT STATES HE IS NOT DIABETIC  . Lumbar spondylosis W/ RADICULOPATHY  . Iron deficiency   . Cataract immature LEFT EYE  . PAF (paroxysmal atrial fibrillation)   . SVT (supraventricular tachycardia)     S/P ABLATION   2008  . Coronary artery disease CARDIOLOGIST- DR Sharrell Ku    09-08-10 VISIT AND PACEMAKER CHECK  IN EPIC  . Cancer     HX OF PROSTATE CANCER--TX'D WITH RADIATION  . Sleep apnea TESTED YRS AGO-- NO CPAP RX GIVEN    PT STATES HE DOES NOT THINK HE STILL HAS SLEEP APNEA--DOES NOT USE CPAP   Past Surgical History  Procedure Laterality Date  . Replacement total knee  1997    RIGHT  . Cardiac pacemaker placement  06-04-10    DDD/ AND REMOVAL LOOR RECORDER  . Loop recorder placement  01-30-10  . Lumbar laminectomy/ diskectomy/ fusion  05-19-10    L4 - 5  . Cholecystectomy  2009  . Cardiac electrophysiology study and ablation  2008    FOR SVT  . Prostate biopsy  2007  . Transurethral resection of prostate  2006  . Inguinal hernia repair  2005    LEFT/   DONE WITH PENILE PROSTESIS SURG.  . Removal and placement penile prosthesis implant   2005    AND LEFT CORPOROPLASTY /    DONE INGUINAL REPAIR  . Penile prosthesis implant  1995  . Knee arthroscopy  06/02/2011    Procedure: ARTHROSCOPY KNEE;  Surgeon: Loanne Drilling;  Location: [facility];  Service: Orthopedics;  Laterality: Left;  LEFT KNEE ARTHROSCOPY WITH DEBRIDEMENT  . Total knee arthroplasty  12/20/2011    Procedure: TOTAL KNEE ARTHROPLASTY;  Surgeon: Loanne Drilling, MD;  Location: [facility];  Service: Orthopedics;  Laterality: Left;  . Back surgery     Family History  Problem Relation Age of Onset  . Stroke Mother   .  Early death Father    Social History:  reports that he has quit smoking. His smoking use included Cigarettes. He has a 5.5 pack-year smoking history. He has never used smokeless tobacco. He reports that he drinks alcohol. He reports that he does not use illicit drugs. Allergies:  Allergies  Allergen Reactions  . Ciprofloxacin Nausea Only    REACTION: GI upset  . Other     SCOLLOPS--SUDDEN GI ATTACK   Medications Prior to Admission  Medication Sig Dispense Refill  . amLODipine (NORVASC) 5 MG tablet Take 5 mg by mouth daily.      Marland Kitchen atorvastatin (LIPITOR) 20 MG tablet Take 20 mg by mouth daily. TAKE ONE TABLET BY MOUTH DAILY      . citalopram (CELEXA) 20 MG tablet Take 1 tablet (20 mg total) by mouth daily.  30 tablet  3  . clotrimazole-betamethasone (LOTRISONE) cream Apply 1 application topically 2 (two) times daily. For psoriasis.      Marland Kitchen diclofenac sodium (VOLTAREN) 1 % GEL Apply 1 application topically 2 (two) times daily as needed. Arthritis in both thumbs.      . esomeprazole (NEXIUM) 40 MG capsule Take 1 capsule (40 mg total) by mouth 2 (two) times daily.  60 capsule  3  . ezetimibe (ZETIA) 10 MG tablet Take 10 mg by mouth daily.      . iron polysaccharides (NIFEREX) 150 MG capsule Take 150 mg by mouth 2 (two) times daily.      . methotrexate (RHEUMATREX) 2.5 MG tablet TAKE FOUR TABLETS BY MOUTH ONCE A WEEK.  16 tablet  10  . methotrexate (RHEUMATREX) 2.5 MG tablet Take 10 mg by mouth once a week. Caution:Chemotherapy. Protect from light. Pt takes 4 tablets on Monday of each week      . MYRBETRIQ 50 MG TB24 tablet Take 50 mg by mouth daily.       . nabumetone (RELAFEN) 750 MG tablet TAKE TWO TABLETS DAILY  60 tablet  5  . nabumetone (RELAFEN) 750 MG tablet Take 750 mg by mouth 2 (two) times daily.      . OXYBUTYNIN CHLORIDE PO Take 5 mg by mouth 2 (two) times daily.       . predniSONE (DELTASONE) 5 MG tablet Take 5 mg by mouth daily.      Marland Kitchen rOPINIRole (REQUIP) 2 MG tablet Take 2 mg by  mouth at bedtime.      Marland Kitchen rOPINIRole (REQUIP) 2 MG tablet Take 2 mg by mouth 2 (two) times daily.      Marland Kitchen triamcinolone cream (KENALOG) 0.1 % APPLY TO AFFECTED AREAS AS DIRECTED  480 g  3  . VESICARE 10 MG tablet Take 10 mg by mouth daily.       . Cyanocobalamin 500 MCG/0.1ML SOLN Place 500 mcg into the nose once a week.        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 Wife and patient advised to have second person in charge start communicating on patients behalf at this time to help business run due to the need to have therapy. Functional Status:  Mobility: Bed Mobility Bed Mobility: Sitting - Scoot to Edge of Bed;Sit to Supine Sitting - Scoot to Edge of Bed: 4: Min guard Sit to Supine: 4: Min guard Transfers Transfers: Sit to Stand;Stand to Sit Sit to Stand: 3: Mod assist;With upper extremity assist Stand to Sit: 3: Mod assist;With upper extremity assist Ambulation/Gait Ambulation/Gait Assistance: 3: Mod assist Ambulation Distance (Feet): 240 Feet Assistive device: 1 person hand held assist Ambulation/Gait Assistance Details: patient with varying instability throughout with multiple LOBs and staggering gait, patient running into chairs, objects and walls on left side. During ambulation, patient given tasks to looks for room and navigate throughout halls. Patient unaware of any rooms/room numbers on his left  side. Significant inattention causing need for multiple VCs and tactile cues.   Gait Pattern: Step-through pattern;Scissoring;Narrow base of support Gait velocity: decreased General Gait Details: very unsteady requires assist to prevent LOB and fall, patient not attending to left side making RW possiblity questionable    ADL: ADL Eating/Feeding: Minimal assistance (wife states "he only ate the right side of his plate") Where Assessed - Eating/Feeding: Bed level Grooming: Wash/dry face;Min guard Where Assessed - Grooming: Supported sitting Lower Body Dressing: Maximal assistance Where Assessed - Lower Body Dressing: Unsupported sit to stand Toilet Transfer: Moderate assistance Toilet Transfer Method: Sit to stand Toilet Transfer Equipment: Raised toilet seat with arms (or 3-in-1 over toilet) Equipment Used: Gait belt Transfers/Ambulation Related to ADLs: Pt demonstrates balance deficits requiring correction from therapist for upright posture. Pt walking into objects on left side (door frame/ chairs) Pt unable to locate room on left side and ambulated to the end of the unit without awareness to passing room but could tell therapist my room is 28. Pt turning around with room on right side and located room immediately ADL Comments: Pt educated on safety with Lt hand. pt getting left hand caught in bed rail x2 prior to sit<.stand with pt completely unaware. PT with x6 bandages on left UE due to scratches/ cuts due to decr sensation. Pt visually asked to look at lt hand compared to right hand in midline. pt made aware of the incr number of injuries to left hand already. Pt and wife asking what exercises can be completed with LT hand. Pt encouraged for weight bearing with transfers with lt hand in proper alignment and bil UE gross open / close grasp at this time. Pt with good return of elbow and shoulder AROm at this time. pt verbalized feeling ready to drive a car after ambulation/ lob with ambulation  and visual assessment. pt and wife educated that no driving can occur until the MD clears patient to drive.   Cognition: Cognition Overall Cognitive Status: Impaired/Different from baseline Arousal/Alertness: Awake/alert Orientation Level: Oriented X4 Attention: Sustained Sustained Attention:  (decreased attention to left) Memory:  (impaired prospective memory and short term memory) Awareness: Impaired (aware to left inattention after planned  failure) Awareness Impairment: Intellectual impairment;Emergent impairment Problem Solving: Appears intact (for basic environment/math, inattn may impact function) Executive Function:  (tba) Safety/Judgment: Appears intact (nurse tech reports pt frustrated w/ not getting oob alone) Cognition Arousal/Alertness: Awake/alert Behavior During Therapy: WFL for tasks assessed/performed Overall Cognitive Status: Impaired/Different from baseline Area of Impairment: Safety/judgement;Awareness Safety/Judgement: Decreased awareness of safety;Decreased awareness of deficits Awareness: Anticipatory General Comments: Pt unaware of Lt Ue deficits and only when directly asked acknowledges Lt hand deficits. Pt with visual inattention to left side of his body. pt must look at left side to know where it is in space  Blood pressure 175/71, pulse 62, temperature 98.5 F (36.9 C), temperature source Oral, resp. rate 18, height 6\' 3"  (1.905 m), weight 102.286 kg (225 lb 8 oz), SpO2 94.00%. Physical Exam  Vitals reviewed. Constitutional: He is oriented to person, place, and time. He appears well-developed.  HENT:  Head: Normocephalic.  Eyes:  Pupils round and reactive to light  Neck: Normal range of motion. Neck supple. No thyromegaly present.  Cardiovascular:  Cardiac rate control   Respiratory: Effort normal and breath sounds normal. No respiratory distress.  GI: Soft. Bowel sounds are normal. He exhibits no distension.  Neurological: He is alert and oriented to  person, place, and time.  Patient with some decreased awareness of left-sided deficits. He does follow simple commands. Left inattention noted. Might have mild left partial field deficit (inconsistent however, and I favor more inattention). LUE is 3+ deltoid, tric, bic, WE is 2+ to 3-, HI are 1+. LLE is 3+ to 4-/5 prox to distal. Senses pain left upper and lower.   Skin: Skin is warm and dry.  Multiple lacs, ecchymoses LUE   Psychiatric:  Fair, basic insight and awareness.     No results found for this or any previous visit (from the past 24 hour(s)). Ct Angio Head W/cm &/or Wo Cm  04/04/2013   CLINICAL DATA:  79 year old male with recent right basal ganglia infarct. Presented with left-sided weakness. Subsequent encounter.  EXAM: CT ANGIOGRAPHY HEAD  TECHNIQUE: Multidetector CT imaging of the head was performed using the standard protocol during bolus administration of intravenous contrast. Multiplanar CT image reconstructions including MIPs were obtained to evaluate the vascular anatomy.  CONTRAST:  50mL OMNIPAQUE IOHEXOL 350 MG/ML SOLN  COMPARISON:  Intracranial MRA 01/15/2010, 11/05/2006.  FINDINGS: Left chest cardiac pacemaker on scout views.  Stable cytotoxic edema in the right caudate and anterior lentiform nuclei. No associated enhancement. Stable mild mass effect on the right frontal horn. No ventriculomegaly. Also, the small focus of cortical or subcortical white matter edema in the right parietal lobe is confirmed (series 8, image 21). No mass effect at that site. Stable and normal right hemisphere gray-white matter differentiation elsewhere. No acute intracranial hemorrhage identified. No midline shift, mass effect, or evidence of intracranial mass lesion. No abnormal enhancement identified.  Stable visualized osseous structures. Stable paranasal sinuses and mastoids. Stable orbit and scalp soft tissues.  VASCULAR FINDINGS:  Major intracranial venous structures are enhancing.  Patent distal  vertebral arteries, the left is dominant and the right functionally terminates in PICA. Normal left PICA origin. Patent basilar artery remarkable for a AE mid basilar fenestration. No stenosis. SCA and PCA origins are within normal limits. Normal right posterior communicating artery, the left is diminutive or absent. Bilateral PCA branches are within normal limits.  Negative distal cervical ICAs. Bilateral ICA siphon calcified atherosclerosis. No hemodynamically significant stenosis results. Ophthalmic artery origins are within normal limits. Normal  right posterior communicating artery origin. Normal carotid termini, MCA and ACA origins. Mildly dominant left ACA A1 segment. Normal anterior communicating artery and visualized ACA branches. Normal left MCA branches.  Right MCA M1 segment is patent and within normal limits. Right MCA bifurcation is patent the and within normal limits. No right MCA branch occlusion identified.  Review of the MIP images confirms the above findings.  IMPRESSION: 1. Stable right MCA territory infarcts with cytotoxic edema in the right basal ganglia and involving a small area of the right parietal lobe. No associated mass effect or hemorrhage.  2. No right MCA stenosis or major branch occlusion identified. Negative anterior circulation except for bilateral ICA siphon calcified plaque without hemodynamically significant stenosis.  3. Negative posterior circulation.   Electronically Signed   By: Augusto Gamble M.D.   On: 04/04/2013 18:18   Dg Chest 2 View  04/03/2013   CLINICAL DATA:  Stroke. Weakness. Hypertension.  EXAM: CHEST  2 VIEW  COMPARISON:  None.  FINDINGS: The heart size and mediastinal contours are within normal limits. Both lungs are clear. No pleural effusion or pneumothorax. Left anterior chest wall sequential pacemaker is stable in well positioned. The bony thorax is demineralized but intact.  IMPRESSION: No active cardiopulmonary disease.   Electronically Signed   By: Amie Portland M.D.   On: 04/03/2013 13:32   Ct Head Wo Contrast  04/04/2013   CLINICAL DATA:  Followup.  EXAM: CT HEAD WITHOUT CONTRAST  TECHNIQUE: Contiguous axial images were obtained from the base of the skull through the vertex without intravenous contrast.  COMPARISON:  04/03/2013  FINDINGS: No acute osseous findings. Mild inflammatory mucosal thickening in the paranasal sinuses. Opacified, expanded air cell in the posterior left mastoid region.  Bilateral proptosis related to increase in retro-orbital fat.  Newly seen low-attenuation in the right caudate head, anterior right putamen, and intervening internal capsule. There is new subcortical low-attenuation in the right parietal region, 2 cm in maximal span. No contralateral ischemia detected. No hemorrhagic complication.  Critical Value/emergent results were called by telephone at the time of interpretation on 04/04/2013 at 6:39 AM to [facility] Schorr, who verbally acknowledged these results.  IMPRESSION: 1. Newly evident infarct of the right caudate head and anterior putamen. Likely 2nd focus of acute infarction involving the subcortical right parietal lobe. Multi focal, unilateral infarcts would favor thromboembolic disease from the right carotid system. 2. No complicating hemorrhage.   Electronically Signed   By: Tiburcio Pea M.D.   On: 04/04/2013 06:49   Ct Head Wo Contrast  04/03/2013   CLINICAL DATA:  Left-sided weakness.  EXAM: CT HEAD WITHOUT CONTRAST  TECHNIQUE: Contiguous axial images were obtained from the base of the skull through the vertex without intravenous contrast.  COMPARISON:  09/10/2010 head CT.  FINDINGS: No intracranial hemorrhage.  Small vessel disease type changes without CT evidence of large acute infarct. Streak artifact limits evaluation of the posterior fossa.  No hydrocephalus.  No intracranial mass lesion noted on this unenhanced exam.  Vascular calcifications. Minimal to mild paranasal sinus mucosal thickening.  Mild  exophthalmos.  IMPRESSION: No intracranial hemorrhage.  Small vessel disease type changes without CT evidence of large acute infarct. Streak artifact limits evaluation of the posterior fossa.   Electronically Signed   By: Bridgett Larsson M.D.   On: 04/03/2013 09:39    Assessment/Plan: Diagnosis: embolic right parietal and caudate cva 1. Does the need for close, 24 hr/day medical supervision in concert with the patient's rehab  needs make it unreasonable for this patient to be served in a less intensive setting? Yes 2. Co-Morbidities requiring supervision/potential complications: htn, afib 3. Due to bladder management, bowel management, safety, skin/wound care, disease management, medication administration, pain management and patient education, does the patient require 24 hr/day rehab nursing? Yes 4. Does the patient require coordinated care of a physician, rehab nurse, PT (1-2 hrs/day, 5 days/week), OT (1-2 hrs/day, 5 days/week) and SLP (1 hrs/day, 5 days/week) to address physical and functional deficits in the context of the above medical diagnosis(es)? Yes Addressing deficits in the following areas: balance, endurance, locomotion, strength, transferring, bowel/bladder control, bathing, dressing, feeding, grooming, toileting, cognition and psychosocial support 5. Can the patient actively participate in an intensive therapy program of at least 3 hrs of therapy per day at least 5 days per week? Yes 6. The potential for patient to make measurable gains while on inpatient rehab is excellent 7. Anticipated functional outcomes upon discharge from inpatient rehab are mod I to supervision with PT, mod I to supervision with OT, mod I to supervision with SLP. 8. Estimated rehab length of stay to reach the above functional goals is: 10-14 days 9. Does the patient have adequate social supports to accommodate these discharge functional goals? Yes 10. Anticipated D/C setting: Home 11. Anticipated post D/C  treatments: Outpt therapy 12. Overall Rehab/Functional Prognosis: excellent  RECOMMENDATIONS: This patient's condition is appropriate for continued rehabilitative care in the following setting: CIR Patient has agreed to participate in recommended program. Yes Note that insurance prior authorization may be required for reimbursement for recommended care.  Comment: Pt is active, has his own business, etc. Would benefit from our intensive inpatient rehab program to maximize functional outcomes. Rehab RN to follow up.   Ranelle Oyster, MD, [name] [facility]     04/05/2013

## 2013-04-05 NOTE — Progress Notes (Signed)
[facility] PROGRESS NOTE  ZONG MCQUARRIE WUJ:[id number] DOB: [birth year] DOA: 04/03/2013 PCP: Carrie Mew, MD  Assessment/Plan:  1-Left side weakness and left facial droop: with stroke risk factors significant for HTN, HLD, PAF -Patient out of therapeutic window for TPA  -Unable to performed MRI secondary to pacemaker implantation.  -CT head without contrast do not show any acute hemorrhagic stroke or acute abnormality.  - Neurology has evaluated and recommended several for secondary stroke prevention. Also recommended a vascular surgeon review current Doppler reports and compared to prior Doppler reports. Lastly also recommends inpatient rehabilitation evaluation - Xarelto to be initiated per cardiology and neurology notes.  2-HYPERLIPIDEMIA:  - Continue statins - lipid panel reviewed.  3-HYPERTENSION: Currently stable. Will continue antihypertensive regimen and heart healthy diet. Last blood pressure recorded was 148/60  4-Paroxysmal ATRIAL fibrillation: s/p pacemaker. Not using any anticoagulation.  - Cardiology consulted for further evaluation and recommendations - Xarelto on board.  5-EXTRINSIC ASTHMA, WITH EXACERBATION:  - stable  6-GERD: -Stable, continue PPI   7-psoriatic arthritis: The patient actively receiving methotrexate and prednisone as an outpatient.  -Will continue prednisone  -Currently no swelling of his joints and no pain.  Code Status: full Family Communication: discussed with family member at bedside Disposition Plan: Pending acceptance into CIR   Consultants:  Neurology   Cardiology  Procedures: CT of the brain  04/04/2013 1. Newly evident infarct of the right caudate head and anterior putamen. Likely 2nd focus of acute infarction involving the subcortical right parietal lobe. Multi focal, unilateral infarcts would favor thromboembolic disease from the right carotid system. 2. No complicating hemorrhage.  04/03/2013 No intracranial  hemorrhage. Small vessel disease type changes without CT evidence of large acute infarct. Streak artifact limits evaluation of the posterior fossa  CT angio of the head  MRI/of the brain pacer  2D Echocardiogram  Carotid Doppler Bilateral internal carotid arteries demonstrate 40-59% internal carotid artery stenosis. Vertebral arteries are patent with antegrade flow.  CXR 04/03/2013 No active cardiopulmonary disease.  EKG normal sinus rhythm.   Antibiotics:  None  HPI/Subjective: Patient has no new complaints. No acute issues reported overnight.  Objective: Filed Vitals:   04/05/13 1451  BP: 148/60  Pulse: 68  Temp: 98.7 F (37.1 C)  Resp: 16   No intake or output data in the 24 hours ending 04/05/13 1615 Filed Weights   04/03/13 1234  Weight: 102.286 kg (225 lb 8 oz)    Exam:   General:  Pt in NAD, Alert and awake  Cardiovascular: RRR, no MRG  Respiratory: CTA BL, no wheezes  Abdomen: soft, NT, ND  Musculoskeletal: no cyanosis or clubbing    Data Reviewed: Basic Metabolic Panel:  Recent Labs Lab 04/03/13 0855 04/03/13 0912 04/04/13 0530  NA 141 144 144  K 3.9 4.1 3.8  CL 106 106 110  CO2 27  --  24  GLUCOSE 106* 104* 104*  BUN 18 21 14   CREATININE 1.11 1.30 0.93  CALCIUM 9.0  --  8.9   Liver Function Tests:  Recent Labs Lab 04/03/13 0855  AST 23  ALT 27  ALKPHOS 87  BILITOT 0.2*  PROT 6.1  ALBUMIN 3.5   No results found for this basename: LIPASE, AMYLASE,  in the last 168 hours No results found for this basename: AMMONIA,  in the last 168 hours CBC:  Recent Labs Lab 04/03/13 0855 04/03/13 0912 04/04/13 0530  WBC 7.9  --  10.7*  NEUTROABS 5.9  --   --  HGB 15.2 15.3 14.6  HCT 43.6 45.0 42.5  MCV 94.0  --  93.8  PLT 198  --  196   Cardiac Enzymes: No results found for this basename: CKTOTAL, CKMB, CKMBINDEX, TROPONINI,  in the last 168 hours BNP (last 3 results) No results found for this basename: PROBNP,  in the last 8760  hours CBG:  Recent Labs Lab 04/03/13 0917  GLUCAP 104*    No results found for this or any previous visit (from the past 240 hour(s)).   Studies: Ct Angio Head W/cm &/or Wo Cm  04/04/2013   CLINICAL DATA:  79 year old male with recent right basal ganglia infarct. Presented with left-sided weakness. Subsequent encounter.  EXAM: CT ANGIOGRAPHY HEAD  TECHNIQUE: Multidetector CT imaging of the head was performed using the standard protocol during bolus administration of intravenous contrast. Multiplanar CT image reconstructions including MIPs were obtained to evaluate the vascular anatomy.  CONTRAST:  50mL OMNIPAQUE IOHEXOL 350 MG/ML SOLN  COMPARISON:  Intracranial MRA 01/15/2010, 11/05/2006.  FINDINGS: Left chest cardiac pacemaker on scout views.  Stable cytotoxic edema in the right caudate and anterior lentiform nuclei. No associated enhancement. Stable mild mass effect on the right frontal horn. No ventriculomegaly. Also, the small focus of cortical or subcortical white matter edema in the right parietal lobe is confirmed (series 8, image 21). No mass effect at that site. Stable and normal right hemisphere gray-white matter differentiation elsewhere. No acute intracranial hemorrhage identified. No midline shift, mass effect, or evidence of intracranial mass lesion. No abnormal enhancement identified.  Stable visualized osseous structures. Stable paranasal sinuses and mastoids. Stable orbit and scalp soft tissues.  VASCULAR FINDINGS:  Major intracranial venous structures are enhancing.  Patent distal vertebral arteries, the left is dominant and the right functionally terminates in PICA. Normal left PICA origin. Patent basilar artery remarkable for a AE mid basilar fenestration. No stenosis. SCA and PCA origins are within normal limits. Normal right posterior communicating artery, the left is diminutive or absent. Bilateral PCA branches are within normal limits.  Negative distal cervical ICAs. Bilateral  ICA siphon calcified atherosclerosis. No hemodynamically significant stenosis results. Ophthalmic artery origins are within normal limits. Normal right posterior communicating artery origin. Normal carotid termini, MCA and ACA origins. Mildly dominant left ACA A1 segment. Normal anterior communicating artery and visualized ACA branches. Normal left MCA branches.  Right MCA M1 segment is patent and within normal limits. Right MCA bifurcation is patent the and within normal limits. No right MCA branch occlusion identified.  Review of the MIP images confirms the above findings.  IMPRESSION: 1. Stable right MCA territory infarcts with cytotoxic edema in the right basal ganglia and involving a small area of the right parietal lobe. No associated mass effect or hemorrhage.  2. No right MCA stenosis or major branch occlusion identified. Negative anterior circulation except for bilateral ICA siphon calcified plaque without hemodynamically significant stenosis.  3. Negative posterior circulation.   Electronically Signed   By: Augusto Gamble M.D.   On: 04/04/2013 18:18   Ct Head Wo Contrast  04/04/2013   CLINICAL DATA:  Followup.  EXAM: CT HEAD WITHOUT CONTRAST  TECHNIQUE: Contiguous axial images were obtained from the base of the skull through the vertex without intravenous contrast.  COMPARISON:  04/03/2013  FINDINGS: No acute osseous findings. Mild inflammatory mucosal thickening in the paranasal sinuses. Opacified, expanded air cell in the posterior left mastoid region.  Bilateral proptosis related to increase in retro-orbital fat.  Newly seen low-attenuation in the  right caudate head, anterior right putamen, and intervening internal capsule. There is new subcortical low-attenuation in the right parietal region, 2 cm in maximal span. No contralateral ischemia detected. No hemorrhagic complication.  Critical Value/emergent results were called by telephone at the time of interpretation on 04/04/2013 at 6:39 AM to [facility] Schorr,  who verbally acknowledged these results.  IMPRESSION: 1. Newly evident infarct of the right caudate head and anterior putamen. Likely 2nd focus of acute infarction involving the subcortical right parietal lobe. Multi focal, unilateral infarcts would favor thromboembolic disease from the right carotid system. 2. No complicating hemorrhage.   Electronically Signed   By: Tiburcio Pea M.D.   On: 04/04/2013 06:49    Scheduled Meds: . amLODipine  10 mg Oral Daily  . atorvastatin  20 mg Oral q1800  . citalopram  20 mg Oral Daily  . darifenacin  7.5 mg Oral Daily  . ezetimibe  10 mg Oral Daily  . iron polysaccharides  150 mg Oral BID  . mirabegron ER  50 mg Oral Daily  . oxybutynin  5 mg Oral BID  . pantoprazole  40 mg Oral BID  . predniSONE  5 mg Oral Q breakfast  . rivaroxaban  20 mg Oral Q supper  . rOPINIRole  2 mg Oral BID   Continuous Infusions:   Principal Problem:   CVA (cerebral infarction) Active Problems:   HYPERLIPIDEMIA   HYPERTENSION   FIBRILLATION, ATRIAL   EXTRINSIC ASTHMA, WITH EXACERBATION   GERD    Time spent: > 35 minutes    Penny Pia  [facility] Pager [phone]. If 7PM-7AM, please contact night-coverage at [email or web address], password [facility] 04/05/2013, 4:15 PM  LOS: 2 days

## 2013-04-05 NOTE — PMR Pre-admission (Signed)
PMR Admission Coordinator Pre-Admission Assessment  Patient: Christopher Baker is an 79 y.o., male MRN: [id number] DOB: 07-15-1933 Height: 6\' 3"  (190.5 cm) Weight: 102.286 kg (225 lb 8 oz)              Insurance Information HMO: yes    PPO:      PCP:      IPA:      80/20:      OTHER:  PRIMARY: Blue Medicare      Policy#: [id number]      Subscriber: pt CM Name:                              Phone#:                               Fax#: [phone] Pre-Cert#:                             Employer: retired Benefits:  Phone #: [phone]     Name: 10/16 Eff. Date: 06/21/12     Deduct: none      Out of Pocket Max: $3400      Life Max: unlimited CIR: $170 per day days 1 thru 7      SNF: no copay days 1 thru 10; $50 copay per day, days 11-100 Outpatient: $35 copay     Co-Pay: no visit limit Home Health: 100%      Co-Pay: no visit limit DME: 80%     Co-Pay: 20% Providers: in network  SECONDARY: none       Emergency Contact Information Contact Information   Name Relation Home Work Mobile   [place] Spouse [phone]  [phone]     Current Medical History  Patient Admitting Diagnosis: embolic right parietal and caudate cva  History of Present Illness: OLGA SEYLER is a 79 y.o. right-handed male with history of hypertension,paroxysmal atrial fibrillation with pacemaker. Patient on no anti-coagulation prior to admission secondary to inconsistent appointments and refused with anticoagulation..   Admitted 04/03/2013 with left-sided weakness and facial droop. Cranial CT scan showed right cortical and subcortical infarcts(right caudate head, right parietal lobe). Infarct felt to be embolic secondary to history of PAF. MRI not completed secondary to pacemaker. Carotid Doppler showed bilateral ICA stenosis 40-59%. Echocardiogram with ejection fraction 55% grade 1 diastolic dysfunction no wall motion abnormalities. CT angiogram head negative for posterior circulation without stenosis or major  occlusion. Patient did not receive TPA. Neurology service followup recommendations of Xarelto for CVA prophylaxis. Patient is maintained on a regular diet. Patient maintained on chronic prednisone therapy for history of psoriatic arthritis as well as methotrexate.  Right greater than left ICA stenosis - pt followed by Dr. Arbie Cookey prior to admission. Last doppler done in June. Preliminary carotid doppler reading showed bilat 40-60% stenosis. Formal reading showed right ICA could be 60-80% vs 40-60% when looking at velocities. Recommend VVS evaluate current doppler reports and compare with previously done reports, either as an IP or OP. If increased in right ICA stenosis, may need to consider CEA post rehab.  Total: 3 NIH  Past Medical History  Past Medical History  Diagnosis Date  . Hyperlipidemia   . HTN (hypertension)   . Status post placement of cardiac pacemaker DDD-  06-04-10-- LAST CHECK 09-08-10 IN EPIC    SECONDARY TO SYNCOPY AND BRADYCARDIA  .  Restless leg syndrome   . Normal nuclear stress test 2008    LOW RISK--   DR  WALL  . Carotid stenosis, bilateral PER DR EARLY / DUPLEX  09-09-10      40 - 59%   BILATERALLY--- ASYMPTOMATIC  . History of echocardiogram 11-21-2006    EF 65%  . History of prostate cancer S/P RADIATION  TX  6 YRS AGO  . Psoriasis   . Arthritis with psoriasis   . GERD (gastroesophageal reflux disease)     CONTROLLED W/ PROTONIX  . Borderline diabetes     DIET CONTROLLED - PT STATES HE IS NOT DIABETIC  . Lumbar spondylosis W/ RADICULOPATHY  . Iron deficiency   . Cataract immature LEFT EYE  . PAF (paroxysmal atrial fibrillation)   . SVT (supraventricular tachycardia)     S/P ABLATION   2008  . Coronary artery disease CARDIOLOGIST- DR Sharrell Ku    09-08-10 VISIT AND PACEMAKER CHECK  IN EPIC  . Cancer     HX OF PROSTATE CANCER--TX'D WITH RADIATION  . Sleep apnea TESTED YRS AGO-- NO CPAP RX GIVEN    PT STATES HE DOES NOT THINK HE STILL HAS SLEEP APNEA--DOES  NOT USE CPAP    Family History  family history includes Early death in his father; Stroke in his mother.  Prior Rehab/Hospitalizations: home health and OP  Current Medications  Current facility-administered medications:acetaminophen (TYLENOL) tablet 650 mg, 650 mg, Oral, Q4H PRN, Roma Kayser Schorr, NP, 650 mg at 04/06/13 0802;  amLODipine (NORVASC) tablet 10 mg, 10 mg, Oral, Daily, Lars Masson, MD, 10 mg at 04/06/13 1024;  atorvastatin (LIPITOR) tablet 20 mg, 20 mg, Oral, q1800, Vassie Loll, MD, 20 mg at 04/05/13 1821;  citalopram (CELEXA) tablet 20 mg, 20 mg, Oral, Daily, Vassie Loll, MD, 20 mg at 04/06/13 1024 darifenacin (ENABLEX) 24 hr tablet 7.5 mg, 7.5 mg, Oral, Daily, Vassie Loll, MD, 7.5 mg at 04/06/13 1024;  diclofenac sodium (VOLTAREN) 1 % transdermal gel 1 application, 1 application, Topical, BID PRN, Vassie Loll, MD;  ezetimibe (ZETIA) tablet 10 mg, 10 mg, Oral, Daily, Vassie Loll, MD, 10 mg at 04/06/13 1024;  iron polysaccharides (NIFEREX) capsule 150 mg, 150 mg, Oral, BID, Vassie Loll, MD, 150 mg at 04/06/13 1025 mirabegron ER (MYRBETRIQ) tablet 50 mg, 50 mg, Oral, Daily, Vassie Loll, MD, 50 mg at 04/06/13 1025;  oxybutynin (DITROPAN) tablet 5 mg, 5 mg, Oral, BID, Vassie Loll, MD, 5 mg at 04/06/13 1024;  pantoprazole (PROTONIX) EC tablet 40 mg, 40 mg, Oral, BID, Vassie Loll, MD, 40 mg at 04/06/13 1025;  predniSONE (DELTASONE) tablet 5 mg, 5 mg, Oral, Q breakfast, Vassie Loll, MD, 5 mg at 04/05/13 1610 Rivaroxaban (XARELTO) tablet 20 mg, 20 mg, Oral, Q supper, Layne Benton, NP, 20 mg at 04/05/13 2107;  rOPINIRole (REQUIP) tablet 2 mg, 2 mg, Oral, BID, Vassie Loll, MD, 2 mg at 04/06/13 9604  Patients Current Diet: Cardiac  Precautions / Restrictions Precautions Precautions: Fall Precaution Comments: Lt inattention Restrictions Weight Bearing Restrictions: No   Prior Activity Level Community (5-7x/wk): 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 Wife and patient advised to have second person in charge start communicating on patients behalf at this time to help  business run due to the need to have therapy.  Current Functional Level Cognition  Arousal/Alertness: Awake/alert Overall Cognitive Status: Impaired/Different from baseline Orientation Level: Oriented X4 Safety/Judgement: Decreased awareness of safety;Decreased awareness of deficits General Comments: Pt with left inattention and demonstrates some increase awareness. Pt remains with v/c to attention left. Pt asking wife and other visitors to sit on the left to him help turn his head. Pt with good carry through from education provided in evaluation. Attention: Sustained Sustained Attention:  (decreased attention to left) Memory:  (impaired prospective memory and short term memory) Awareness: Impaired (aware to left inattention after planned failure) Awareness Impairment: Intellectual impairment;Emergent impairment Problem Solving: Appears intact (for basic environment/math, inattn may impact function) Executive Function:  (tba) Safety/Judgment: Appears intact (nurse tech reports pt frustrated w/ not getting oob alone)    Extremity Assessment (includes Sensation/Coordination)          ADLs  Eating/Feeding: Minimal assistance (wife states "he only ate the right side of his plate") Where Assessed - Eating/Feeding: Bed level Grooming: Wash/dry hands;Wash/dry face;Teeth care;Minimal assistance Where Assessed - Grooming: Unsupported standing Upper Body Bathing: Chest;Right arm;Left arm;Abdomen;Minimal assistance Where Assessed - Upper Body Bathing: Unsupported sitting  (relies on UE on counter for support) Lower Body Bathing: Minimal assistance Where Assessed - Lower Body Bathing: Supported standing Upper Body Dressing: Minimal assistance Where Assessed - Upper Body Dressing: Supported sit to stand Lower Body Dressing: Moderate assistance Where Assessed - Lower Body Dressing: Supported sit to Pharmacist, hospital: Moderate assistance Toilet Transfer Method: Sit to stand Toilet Transfer Equipment: Regular height toilet;Grab bars Toileting - Clothing Manipulation and Hygiene: Maximal assistance Where Assessed - Toileting Clothing Manipulation and Hygiene: Sit to stand from 3-in-1 or toilet Equipment Used: Gait belt Transfers/Ambulation Related to ADLs: Pt demonstrates decr gait velocity and needs v/c for Lt inattention.pt needs cues to turn head to scan to the left to prevent walking into doors / door ways ADL Comments: Pt completed adl at sink level this AM. pt educated prior to ambulation to the bathroom that all necessary items are in the bathroom. He should not have to ask for any items. He will have to find the items. PT expressed understanding. Pt needed BIL socks half don and (A) to pull sock over the heels. Pt unsteady sit<>stand Min (A). pt ambulated to bathroom. pt neglecting all objects on the left. Pt knocking object off and hearing it hit. pt state "what was that?" Pt unaware he had knocked his bathroom kit off the counter with left hand. pt with a new cut on left UE this session from injury after yesterdays education. pt demonstrates AROM wrist this session wtih gravity eliminated. Pt remains without digit flexion/ extension. Pt and wife very excited for wrist AROM. Pt provided comb to brush hair. Pt asked "how am I suppose to see?" Pt cued there is a mirror in the room. Pt laughed and said "its on the left isnt it" Pt verbalizing this statement demonstrates incr awareness of left side from education. This carry over is why patient is an excellent CIR  candidate. Pt reaching for objects with Lt hand and unable to hold / grasp. Pt educated on using Lt UE stablized on sink counter to hold tooth brush and apply paste with Rt UE. pt very pleased with being able to initiate task with increaseing independence. pt dropping objects throughout session with multiple attempts. Pt progressing with OT . Pt with new bandages applied to Lt hand wounds. WIfe and patient  educated that RN may need to redress sites later today or tomorrow if needed. Pt with skin tear upper arm that requires large bandage and very long skin tear at 3rd 4th 5th MCP.     Mobility  Bed Mobility: Sitting - Scoot to Edge of Bed;Sit to Supine;Supine to Sit Supine to Sit: 4: Min guard;With rails;HOB elevated Sitting - Scoot to Edge of Bed: 4: Min guard Sit to Supine: 4: Min guard    Transfers  Transfers: Sit to Stand;Stand to Sit Sit to Stand: 4: Min assist;With upper extremity assist;From chair/3-in-1;From toilet Stand to Sit: 4: Min assist;With upper extremity assist;To bed;To toilet    Ambulation / Gait / Stairs / Wheelchair Mobility  Ambulation/Gait Ambulation/Gait Assistance: 4: Min Environmental consultant (Feet): 350 Feet Assistive device: 1 person hand held assist Ambulation/Gait Assistance Details: Patient with increased balance this session. Continues with slight instability still with head turns and patient has tendency to turn quickly cause decreased balance. Patient stating, "thats why youre here". Worked on attending to objects and doorways on his left side Gait Pattern: Step-through pattern;Scissoring;Narrow base of support Gait velocity: decreased General Gait Details: very unsteady requires assist to prevent LOB and fall, patient not attending to left side making RW possiblity questionable    Posture / Balance Static Sitting Balance Static Sitting - Balance Support: Feet unsupported Static Sitting - Level of Assistance: 5: Stand by assistance High Level  Balance High Level Balance Activites: Side stepping;Backward walking;Direction changes;Turns;Head turns High Level Balance Comments: min guard to moderate assist with varying instability. Left inattention causing significant risk with ambulation.    Special needs/care consideration Bowel mgmt: Last BM 04/04/13 Bladder mgmt: [place] WDL Diabetic mgmt No   Previous Home Environment Living Arrangements: Spouse/significant other  Lives With: Spouse Available Help at Discharge: Family Type of Home: House Home Layout: Two level;Able to live on main level with bedroom/bathroom Home Access: Stairs to enter Entrance Stairs-Rails: Can reach both Entrance Stairs-Number of Steps: 3 Bathroom Shower/Tub: Health visitor: Standard Bathroom Accessibility: Yes How Accessible: Accessible via walker Home Care Services: No  Discharge Living Setting Plans for Discharge Living Setting: Patient's home;Lives with (comment);Other (Comment) (wife) Type of Home at Discharge: House Discharge Home Layout: Two level;Able to live on main level with bedroom/bathroom Alternate Level Stairs-Rails: Right;Left;Can reach both Discharge Home Access: Stairs to enter Entrance Stairs-Rails: Right;Left;Can reach both Entrance Stairs-Number of Steps: 3 Discharge Bathroom Shower/Tub: Walk-in shower Discharge Bathroom Toilet: Standard Discharge Bathroom Accessibility: Yes How Accessible: Accessible via walker Does the patient have any problems obtaining your medications?: No  Social/Family/Support Systems Patient Roles: Spouse;Other (Comment) (employer; works from home) Solicitor Information: Shirl Harris, wife Anticipated Caregiver: wife Anticipated Caregiver's Contact Information: home [phone]; cell [phone] Ability/Limitations of Caregiver: 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 Caregiver Availability: 24/7 Discharge Plan Discussed with Primary Caregiver: Yes Is Caregiver In Agreement with  Plan?: Yes Does Caregiver/Family have Issues with Lodging/Transportation while Pt is in Rehab?: No  Goals/Additional Needs Patient/Family Goal for Rehab: Mod I to supervision with PT, OT, and SLP Expected length of stay: ELOS 10 to 14 days Program Orientation Provided & Reviewed with Pt/Caregiver Including Roles  & Responsibilities: Yes  Decrease burden of Care through IP rehab admission: N/A  Possible need for SNF placement upon discharge:not anticipated  Patient Condition: This patient's condition remains as documented in the consult dated 04/05/13, in which the Rehabilitation Physician determined and documented that the patient's condition is appropriate for intensive rehabilitative care in an  inpatient rehabilitation facility. Will admit to inpatient rehab today.  Preadmission Screen Completed By:  Trish Mage, 04/06/2013 1:59 PM ______________________________________________________________________   Discussed status with Dr. Riley Kill on 04/06/13 at 1400 and received telephone approval for admission today.  Admission Coordinator:  Trish Mage, time1400/Date10/17/14

## 2013-04-05 NOTE — Progress Notes (Signed)
Physical Therapy Treatment Patient Details Name: Christopher Baker MRN: [id number] DOB: 08-Sep-1933 Today's Date: 04/05/2013 Time: 1342-1410 PT Time Calculation (min): 28 min  PT Assessment / Plan / Recommendation  History of Present Illness 79 y.o. male with Lt facial droop and s/p fall. Pt with hx of ypertension, hyperlipidemia, paroxysmal atrial fibrillation status post pacemaker (currently no receiving any anticoagulation therapy), GERD, psoriasis and history of carotid artery stenosis.CT  Newly evident infarct of the right caudate head and anterior putamen. Likely 2nd focus of acute infarction involving the subcortical right parietal lobe. Multi focal, unilateral infarcts would favor thromboembolic disease from the right carotid system.    PT Comments   Patient very motivated to work with therapy. Patient appears more aware of left inattention this session. Focused on attending to objects of the left this session. Continue to recommend comprehensive inpatient rehab (CIR) for post-acute therapy needs.   Follow Up Recommendations  CIR     Does the patient have the potential to tolerate intense rehabilitation     Barriers to Discharge        Equipment Recommendations       Recommendations for Other Services Rehab consult  Frequency Min 3X/week   Progress towards PT Goals Progress towards PT goals: Progressing toward goals  Plan Current plan remains appropriate    Precautions / Restrictions Precautions Precautions: Fall Precaution Comments: Lt inattention   Pertinent Vitals/Pain no apparent distress    Mobility  Bed Mobility Sit to Supine: 4: Min guard Details for Bed Mobility Assistance: MinGuard for safety Transfers Sit to Stand: 4: Min assist;With upper extremity assist;From chair/3-in-1;From toilet Stand to Sit: 4: Min assist;With upper extremity assist;To bed;To toilet Details for Transfer Assistance: Cues for safe technique and hand  placement Ambulation/Gait Ambulation/Gait Assistance: 4: Min assist Ambulation Distance (Feet): 350 Feet Assistive device: 1 person hand held assist Ambulation/Gait Assistance Details: Patient with increased balance this session. Continues with slight instability still with head turns and patient has tendency to turn quickly cause decreased balance. Patient stating, "thats why youre here". Worked on attending to objects and doorways on his left side Gait Pattern: Step-through pattern;Scissoring;Narrow base of support    Exercises     PT Diagnosis:    PT Problem List:   PT Treatment Interventions:     PT Goals (current goals can now be found in the care plan section)    Visit Information  Last PT Received On: 04/05/13 Assistance Needed: +1 History of Present Illness: 79 y.o. male with Lt facial droop and s/p fall. Pt with hx of ypertension, hyperlipidemia, paroxysmal atrial fibrillation status post pacemaker (currently no receiving any anticoagulation therapy), GERD, psoriasis and history of carotid artery stenosis.CT  Newly evident infarct of the right caudate head and anterior putamen. Likely 2nd focus of acute infarction involving the subcortical right parietal lobe. Multi focal, unilateral infarcts would favor thromboembolic disease from the right carotid system.     Subjective Data      Cognition  Cognition Arousal/Alertness: Awake/alert Behavior During Therapy: WFL for tasks assessed/performed Overall Cognitive Status: Impaired/Different from baseline Area of Impairment: Safety/judgement;Awareness Safety/Judgement: Decreased awareness of safety;Decreased awareness of deficits Awareness: Anticipatory    Balance  High Level Balance High Level Balance Activites: Side stepping;Backward walking;Direction changes;Turns;Head turns High Level Balance Comments: min guard to moderate assist with varying instability. Left inattention causing significant risk with ambulation.  End of  Session PT - End of Session Activity Tolerance: Patient tolerated treatment well Patient left: with call bell/phone within  reach;with family/visitor present;in bed Nurse Communication: Mobility status   GP     Fredrich Birks 04/05/2013, 2:34 PM 04/05/2013 Fredrich Birks PTA [phone] pager [phone] office

## 2013-04-05 NOTE — Progress Notes (Signed)
Patient ID: Christopher Baker, male   DOB: [birth year], 79 y.o.   MRN: [id number] Subjective:  No chest pain, sob, or palpitations. Still some neglect and weakness of his left hand.  Objective:  Vital Signs in the last 24 hours: Temp:  [98.1 F (36.7 C)-98.8 F (37.1 C)] 98.5 F (36.9 C) (10/16 0604) Pulse Rate:  [58-76] 62 (10/16 0604) Resp:  [18-20] 18 (10/16 0604) BP: (158-181)/(71-89) 175/71 mmHg (10/16 0604) SpO2:  [93 %-95 %] 94 % (10/16 0604)  Intake/Output from previous day: 10/15 0701 - 10/16 0700 In: -  Out: 350 [Urine:350] Intake/Output from this shift:    Physical Exam: Well appearing NAD HEENT: Unremarkable Neck:  No JVD, no thyromegally Lymphatics:  No adenopathy Back:  No CVA tenderness Lungs:  Clear HEART:  Regular rate rhythm, no murmurs, no rubs, no clicks Abd:  Flat, positive bowel sounds, no organomegally, no rebound, no guarding Ext:  2 plus pulses, no edema, no cyanosis, no clubbing Skin:  No rashes no nodules Neuro:  CN II through XII intact, motor grossly intact except as noted above.  Lab Results:  Recent Labs  04/03/13 0855 04/03/13 0912 04/04/13 0530  WBC 7.9  --  10.7*  HGB 15.2 15.3 14.6  PLT 198  --  196    Recent Labs  04/03/13 0855 04/03/13 0912 04/04/13 0530  NA 141 144 144  K 3.9 4.1 3.8  CL 106 106 110  CO2 27  --  24  GLUCOSE 106* 104* 104*  BUN 18 21 14   CREATININE 1.11 1.30 0.93   No results found for this basename: TROPONINI, CK, MB,  in the last 72 hours Hepatic Function Panel  Recent Labs  04/03/13 0855  PROT 6.1  ALBUMIN 3.5  AST 23  ALT 27  ALKPHOS 87  BILITOT 0.2*    Recent Labs  04/04/13 0530  CHOL 169   No results found for this basename: PROTIME,  in the last 72 hours  Imaging: Ct Angio Head W/cm &/or Wo Cm  04/04/2013   CLINICAL DATA:  79 year old male with recent right basal ganglia infarct. Presented with left-sided weakness. Subsequent encounter.  EXAM: CT ANGIOGRAPHY HEAD  TECHNIQUE:  Multidetector CT imaging of the head was performed using the standard protocol during bolus administration of intravenous contrast. Multiplanar CT image reconstructions including MIPs were obtained to evaluate the vascular anatomy.  CONTRAST:  50mL OMNIPAQUE IOHEXOL 350 MG/ML SOLN  COMPARISON:  Intracranial MRA 01/15/2010, 11/05/2006.  FINDINGS: Left chest cardiac pacemaker on scout views.  Stable cytotoxic edema in the right caudate and anterior lentiform nuclei. No associated enhancement. Stable mild mass effect on the right frontal horn. No ventriculomegaly. Also, the small focus of cortical or subcortical white matter edema in the right parietal lobe is confirmed (series 8, image 21). No mass effect at that site. Stable and normal right hemisphere gray-white matter differentiation elsewhere. No acute intracranial hemorrhage identified. No midline shift, mass effect, or evidence of intracranial mass lesion. No abnormal enhancement identified.  Stable visualized osseous structures. Stable paranasal sinuses and mastoids. Stable orbit and scalp soft tissues.  VASCULAR FINDINGS:  Major intracranial venous structures are enhancing.  Patent distal vertebral arteries, the left is dominant and the right functionally terminates in PICA. Normal left PICA origin. Patent basilar artery remarkable for a AE mid basilar fenestration. No stenosis. SCA and PCA origins are within normal limits. Normal right posterior communicating artery, the left is diminutive or absent. Bilateral PCA branches are within normal limits.  Negative distal cervical ICAs. Bilateral ICA siphon calcified atherosclerosis. No hemodynamically significant stenosis results. Ophthalmic artery origins are within normal limits. Normal right posterior communicating artery origin. Normal carotid termini, MCA and ACA origins. Mildly dominant left ACA A1 segment. Normal anterior communicating artery and visualized ACA branches. Normal left MCA branches.  Right MCA  M1 segment is patent and within normal limits. Right MCA bifurcation is patent the and within normal limits. No right MCA branch occlusion identified.  Review of the MIP images confirms the above findings.  IMPRESSION: 1. Stable right MCA territory infarcts with cytotoxic edema in the right basal ganglia and involving a small area of the right parietal lobe. No associated mass effect or hemorrhage.  2. No right MCA stenosis or major branch occlusion identified. Negative anterior circulation except for bilateral ICA siphon calcified plaque without hemodynamically significant stenosis.  3. Negative posterior circulation.   Electronically Signed   By: Augusto Gamble M.D.   On: 04/04/2013 18:18   Dg Chest 2 View  04/03/2013   CLINICAL DATA:  Stroke. Weakness. Hypertension.  EXAM: CHEST  2 VIEW  COMPARISON:  None.  FINDINGS: The heart size and mediastinal contours are within normal limits. Both lungs are clear. No pleural effusion or pneumothorax. Left anterior chest wall sequential pacemaker is stable in well positioned. The bony thorax is demineralized but intact.  IMPRESSION: No active cardiopulmonary disease.   Electronically Signed   By: Amie Portland M.D.   On: 04/03/2013 13:32   Ct Head Wo Contrast  04/04/2013   CLINICAL DATA:  Followup.  EXAM: CT HEAD WITHOUT CONTRAST  TECHNIQUE: Contiguous axial images were obtained from the base of the skull through the vertex without intravenous contrast.  COMPARISON:  04/03/2013  FINDINGS: No acute osseous findings. Mild inflammatory mucosal thickening in the paranasal sinuses. Opacified, expanded air cell in the posterior left mastoid region.  Bilateral proptosis related to increase in retro-orbital fat.  Newly seen low-attenuation in the right caudate head, anterior right putamen, and intervening internal capsule. There is new subcortical low-attenuation in the right parietal region, 2 cm in maximal span. No contralateral ischemia detected. No hemorrhagic complication.   Critical Value/emergent results were called by telephone at the time of interpretation on 04/04/2013 at 6:39 AM to [facility] Schorr, who verbally acknowledged these results.  IMPRESSION: 1. Newly evident infarct of the right caudate head and anterior putamen. Likely 2nd focus of acute infarction involving the subcortical right parietal lobe. Multi focal, unilateral infarcts would favor thromboembolic disease from the right carotid system. 2. No complicating hemorrhage.   Electronically Signed   By: Tiburcio Pea M.D.   On: 04/04/2013 06:49   Ct Head Wo Contrast  04/03/2013   CLINICAL DATA:  Left-sided weakness.  EXAM: CT HEAD WITHOUT CONTRAST  TECHNIQUE: Contiguous axial images were obtained from the base of the skull through the vertex without intravenous contrast.  COMPARISON:  09/10/2010 head CT.  FINDINGS: No intracranial hemorrhage.  Small vessel disease type changes without CT evidence of large acute infarct. Streak artifact limits evaluation of the posterior fossa.  No hydrocephalus.  No intracranial mass lesion noted on this unenhanced exam.  Vascular calcifications. Minimal to mild paranasal sinus mucosal thickening.  Mild exophthalmos.  IMPRESSION: No intracranial hemorrhage.  Small vessel disease type changes without CT evidence of large acute infarct. Streak artifact limits evaluation of the posterior fossa.   Electronically Signed   By: Bridgett Larsson M.D.   On: 04/03/2013 09:39    Cardiac Studies:  Tele - nsr with atrial pacing Assessment/Plan:   LOS: 2 days   1. Stroke 2. S/p PPM with rare (<0.1%) atrial fib on PPM interogation 3. H/o non-compliance, not thought to be a good candidate for coumadin 4. H/o easy bruisibility, patient not previously inclined to pursue anti-coag. Rec: agree with plans for Xarelto. He will need close followup. He is pending rehab. He has some neglect of left hand. Hopefully this will improve.  Gregg Taylor,M.D. 04/05/2013, 8:43 AM

## 2013-04-06 ENCOUNTER — Inpatient Hospital Stay (HOSPITAL_COMMUNITY)
Admission: RE | Admit: 2013-04-06 | Discharge: 2013-04-14 | DRG: 945 | Disposition: A | Discharge status: Home / Self Care | Payer: Medicare Other | Source: Intra-hospital | Attending: Physical Medicine & Rehabilitation | Admitting: Physical Medicine & Rehabilitation

## 2013-04-06 ENCOUNTER — Encounter: Payer: Medicare Other | Admitting: Internal Medicine

## 2013-04-06 DIAGNOSIS — F3289 Other specified depressive episodes: Secondary | ICD-10-CM | POA: Diagnosis present

## 2013-04-06 DIAGNOSIS — R29810 Facial weakness: Secondary | ICD-10-CM | POA: Diagnosis present

## 2013-04-06 DIAGNOSIS — IMO0002 Reserved for concepts with insufficient information to code with codable children: Secondary | ICD-10-CM

## 2013-04-06 DIAGNOSIS — G8190 Hemiplegia, unspecified affecting unspecified side: Secondary | ICD-10-CM | POA: Diagnosis present

## 2013-04-06 DIAGNOSIS — I6529 Occlusion and stenosis of unspecified carotid artery: Secondary | ICD-10-CM | POA: Diagnosis present

## 2013-04-06 DIAGNOSIS — K219 Gastro-esophageal reflux disease without esophagitis: Secondary | ICD-10-CM | POA: Diagnosis present

## 2013-04-06 DIAGNOSIS — M109 Gout, unspecified: Secondary | ICD-10-CM | POA: Diagnosis present

## 2013-04-06 DIAGNOSIS — E663 Overweight: Secondary | ICD-10-CM | POA: Diagnosis present

## 2013-04-06 DIAGNOSIS — I6340 Cerebral infarction due to embolism of unspecified cerebral artery: Secondary | ICD-10-CM | POA: Diagnosis present

## 2013-04-06 DIAGNOSIS — G2581 Restless legs syndrome: Secondary | ICD-10-CM | POA: Diagnosis present

## 2013-04-06 DIAGNOSIS — I658 Occlusion and stenosis of other precerebral arteries: Secondary | ICD-10-CM | POA: Diagnosis present

## 2013-04-06 DIAGNOSIS — I10 Essential (primary) hypertension: Secondary | ICD-10-CM | POA: Diagnosis present

## 2013-04-06 DIAGNOSIS — N400 Enlarged prostate without lower urinary tract symptoms: Secondary | ICD-10-CM | POA: Diagnosis present

## 2013-04-06 DIAGNOSIS — M129 Arthropathy, unspecified: Secondary | ICD-10-CM | POA: Diagnosis present

## 2013-04-06 DIAGNOSIS — G4730 Sleep apnea, unspecified: Secondary | ICD-10-CM | POA: Diagnosis present

## 2013-04-06 DIAGNOSIS — Z950 Presence of cardiac pacemaker: Secondary | ICD-10-CM | POA: Diagnosis not present

## 2013-04-06 DIAGNOSIS — F329 Major depressive disorder, single episode, unspecified: Secondary | ICD-10-CM | POA: Diagnosis present

## 2013-04-06 DIAGNOSIS — E785 Hyperlipidemia, unspecified: Secondary | ICD-10-CM | POA: Diagnosis present

## 2013-04-06 DIAGNOSIS — Z87891 Personal history of nicotine dependence: Secondary | ICD-10-CM | POA: Diagnosis not present

## 2013-04-06 DIAGNOSIS — I4891 Unspecified atrial fibrillation: Secondary | ICD-10-CM | POA: Diagnosis present

## 2013-04-06 DIAGNOSIS — I2510 Atherosclerotic heart disease of native coronary artery without angina pectoris: Secondary | ICD-10-CM | POA: Diagnosis present

## 2013-04-06 DIAGNOSIS — L4050 Arthropathic psoriasis, unspecified: Secondary | ICD-10-CM | POA: Diagnosis present

## 2013-04-06 DIAGNOSIS — M25569 Pain in unspecified knee: Secondary | ICD-10-CM

## 2013-04-06 DIAGNOSIS — Z96659 Presence of unspecified artificial knee joint: Secondary | ICD-10-CM | POA: Diagnosis not present

## 2013-04-06 DIAGNOSIS — F0150 Vascular dementia without behavioral disturbance: Secondary | ICD-10-CM

## 2013-04-06 DIAGNOSIS — M7500 Adhesive capsulitis of unspecified shoulder: Secondary | ICD-10-CM | POA: Diagnosis present

## 2013-04-06 DIAGNOSIS — Z5189 Encounter for other specified aftercare: Secondary | ICD-10-CM | POA: Diagnosis present

## 2013-04-06 MED ORDER — EZETIMIBE 10 MG PO TABS
10.0000 mg | ORAL_TABLET | Freq: Every day | ORAL | Status: DC
  Administered 2013-04-07 – 2013-04-14 (×8): 10 mg via ORAL
  Filled 2013-04-06 (×10): qty 1

## 2013-04-06 MED ORDER — AMLODIPINE BESYLATE 10 MG PO TABS
10.0000 mg | ORAL_TABLET | Freq: Every day | ORAL | Status: DC
  Administered 2013-04-07 – 2013-04-14 (×8): 10 mg via ORAL
  Filled 2013-04-06 (×10): qty 1

## 2013-04-06 MED ORDER — ONDANSETRON HCL 4 MG/2ML IJ SOLN: 4.0000 mg | Freq: Four times a day (QID) | INTRAMUSCULAR | Status: DC | PRN

## 2013-04-06 MED ORDER — POLYSACCHARIDE IRON COMPLEX 150 MG PO CAPS
150.0000 mg | ORAL_CAPSULE | Freq: Two times a day (BID) | ORAL | Status: DC
  Administered 2013-04-06 – 2013-04-14 (×16): 150 mg via ORAL
  Filled 2013-04-06 (×18): qty 1

## 2013-04-06 MED ORDER — ACETAMINOPHEN 325 MG PO TABS
650.0000 mg | ORAL_TABLET | ORAL | Status: DC | PRN
  Administered 2013-04-07 – 2013-04-13 (×3): 650 mg via ORAL
  Filled 2013-04-06 (×3): qty 2

## 2013-04-06 MED ORDER — ROPINIROLE HCL 1 MG PO TABS
2.0000 mg | ORAL_TABLET | Freq: Two times a day (BID) | ORAL | Status: DC
  Administered 2013-04-07: 2 mg via ORAL
  Filled 2013-04-06 (×3): qty 2

## 2013-04-06 MED ORDER — RIVAROXABAN 20 MG PO TABS: 20.0000 mg | ORAL_TABLET | Freq: Every day | ORAL | Status: DC

## 2013-04-06 MED ORDER — PANTOPRAZOLE SODIUM 40 MG PO TBEC
40.0000 mg | DELAYED_RELEASE_TABLET | Freq: Two times a day (BID) | ORAL | Status: DC
  Administered 2013-04-06 – 2013-04-14 (×16): 40 mg via ORAL
  Filled 2013-04-06 (×19): qty 1

## 2013-04-06 MED ORDER — DARIFENACIN HYDROBROMIDE ER 7.5 MG PO TB24
7.5000 mg | ORAL_TABLET | Freq: Every day | ORAL | Status: DC
  Filled 2013-04-06 (×3): qty 1

## 2013-04-06 MED ORDER — ONDANSETRON HCL 4 MG PO TABS: 4.0000 mg | ORAL_TABLET | Freq: Four times a day (QID) | ORAL | Status: DC | PRN

## 2013-04-06 MED ORDER — MIRABEGRON ER 50 MG PO TB24
50.0000 mg | ORAL_TABLET | Freq: Every day | ORAL | Status: DC
  Administered 2013-04-07: 50 mg via ORAL
  Filled 2013-04-06 (×2): qty 1

## 2013-04-06 MED ORDER — SORBITOL 70 % SOLN: 30.0000 mL | Freq: Every day | Status: DC | PRN

## 2013-04-06 MED ORDER — PREDNISONE 5 MG PO TABS
5.0000 mg | ORAL_TABLET | Freq: Every day | ORAL | Status: DC
  Administered 2013-04-07 – 2013-04-08 (×2): 5 mg via ORAL
  Filled 2013-04-06 (×3): qty 1

## 2013-04-06 MED ORDER — DICLOFENAC SODIUM 1 % TD GEL
1.0000 "application " | Freq: Two times a day (BID) | TRANSDERMAL | Status: DC | PRN
  Administered 2013-04-07 – 2013-04-08 (×2): 1 via TOPICAL
  Filled 2013-04-06: qty 100

## 2013-04-06 MED ORDER — OXYBUTYNIN CHLORIDE 5 MG PO TABS
5.0000 mg | ORAL_TABLET | Freq: Two times a day (BID) | ORAL | Status: DC
  Administered 2013-04-06: 5 mg via ORAL
  Filled 2013-04-06 (×4): qty 1

## 2013-04-06 MED ORDER — ROPINIROLE HCL 1 MG PO TABS
2.0000 mg | ORAL_TABLET | Freq: Once | ORAL | Status: AC
  Administered 2013-04-06: 2 mg via ORAL
  Filled 2013-04-06: qty 2

## 2013-04-06 MED ORDER — CITALOPRAM HYDROBROMIDE 20 MG PO TABS
20.0000 mg | ORAL_TABLET | Freq: Every day | ORAL | Status: DC
  Administered 2013-04-07 – 2013-04-13 (×7): 20 mg via ORAL
  Filled 2013-04-06 (×10): qty 1

## 2013-04-06 MED ORDER — ATORVASTATIN CALCIUM 20 MG PO TABS
20.0000 mg | ORAL_TABLET | Freq: Every day | ORAL | Status: DC
  Administered 2013-04-07 – 2013-04-13 (×7): 20 mg via ORAL
  Filled 2013-04-06 (×9): qty 1

## 2013-04-06 MED ORDER — RIVAROXABAN 20 MG PO TABS
20.0000 mg | ORAL_TABLET | Freq: Every day | ORAL | Status: DC
  Administered 2013-04-07 – 2013-04-13 (×7): 20 mg via ORAL
  Filled 2013-04-06 (×9): qty 1

## 2013-04-06 NOTE — Progress Notes (Signed)
Called report to rehab.  Transferring patient now.  Lance Bosch, RN

## 2013-04-06 NOTE — H&P (Signed)
Physical Medicine and Rehabilitation Admission H&P  Chief Complaint   Patient presents with   .  Stroke Symptoms   :  HPI: Christopher Baker is a 79 y.o. right-handed male with history of hypertension,paroxysmal atrial fibrillation with pacemaker. Patient on no anti-coagulation prior to admission secondary to inconsistent appointments and refused with anticoagulation.. Admitted 04/03/2013 with left-sided weakness and facial droop. Cranial CT scan showed right cortical and subcortical infarcts(right caudate head, right parietal lobe). Infarct felt to be embolic secondary to history of PAF. MRI not completed secondary to pacemaker. Carotid Doppler showed bilateral ICA stenosis 40-59%. Echocardiogram with ejection fraction 55% grade 1 diastolic dysfunction no wall motion abnormalities. CT angiogram head negative for posterior circulation without stenosis or major occlusion. Patient did not receive TPA. Neurology service followup recommendations of Xarelto for CVA prophylaxis. Patient is maintained on a regular diet. Patient maintained on chronic prednisone therapy for history of psoriatic arthritis as well as methotrexate. Physical and occupational therapy evaluations completed an ongoing with recommendations for physical medicine rehabilitation consult to consider inpatient rehabilitation services. Patient was felt to be a good candidate for inpatient rehabilitation services and was admitted for comprehensive rehabilitation program today.  ROS Review of Systems  Cardiovascular: Positive for palpitations.  Gastrointestinal:  GERD  Musculoskeletal: Positive for myalgias.  Psychiatric/Behavioral: Positive for depression.  Genitourinary. Frequency/urgency  All other systems reviewed and are negative  Past Medical History   Diagnosis  Date   .  Hyperlipidemia    .  HTN (hypertension)    .  Status post placement of cardiac pacemaker  DDD- 06-04-10-- LAST CHECK 09-08-10 IN EPIC     SECONDARY TO SYNCOPY AND  BRADYCARDIA   .  Restless leg syndrome    .  Normal nuclear stress test  2008     LOW RISK-- DR WALL   .  Carotid stenosis, bilateral  PER DR EARLY / DUPLEX 09-09-10     40 - 59% BILATERALLY--- ASYMPTOMATIC   .  History of echocardiogram  11-21-2006     EF 65%   .  History of prostate cancer  S/P RADIATION TX 6 YRS AGO   .  Psoriasis    .  Arthritis with psoriasis    .  GERD (gastroesophageal reflux disease)      CONTROLLED W/ PROTONIX   .  Borderline diabetes      DIET CONTROLLED - PT STATES HE IS NOT DIABETIC   .  Lumbar spondylosis  W/ RADICULOPATHY   .  Iron deficiency    .  Cataract immature  LEFT EYE   .  PAF (paroxysmal atrial fibrillation)    .  SVT (supraventricular tachycardia)      S/P ABLATION 2008   .  Coronary artery disease  CARDIOLOGIST- DR Sharrell Ku     09-08-10 VISIT AND PACEMAKER CHECK IN EPIC   .  Cancer      HX OF PROSTATE CANCER--TX'D WITH RADIATION   .  Sleep apnea  TESTED YRS AGO-- NO CPAP RX GIVEN     PT STATES HE DOES NOT THINK HE STILL HAS SLEEP APNEA--DOES NOT USE CPAP    Past Surgical History   Procedure  Laterality  Date   .  Replacement total knee   1997     RIGHT   .  Cardiac pacemaker placement   06-04-10     DDD/ AND REMOVAL LOOR RECORDER   .  Loop recorder placement   01-30-10   .  Lumbar laminectomy/  diskectomy/ fusion   05-19-10     L4 - 5   .  Cholecystectomy   2009   .  Cardiac electrophysiology study and ablation   2008     FOR SVT   .  Prostate biopsy   2007   .  Transurethral resection of prostate   2006   .  Inguinal hernia repair   2005     LEFT/ DONE WITH PENILE PROSTESIS SURG.   .  Removal and placement penile prosthesis implant   2005     AND LEFT CORPOROPLASTY / DONE INGUINAL REPAIR   .  Penile prosthesis implant   1995   .  Knee arthroscopy   06/02/2011     Procedure: ARTHROSCOPY KNEE; Surgeon: Loanne Drilling; Location: [facility]; Service: Orthopedics; Laterality: Left; LEFT KNEE ARTHROSCOPY WITH  DEBRIDEMENT   .  Total knee arthroplasty   12/20/2011     Procedure: TOTAL KNEE ARTHROPLASTY; Surgeon: Loanne Drilling, MD; Location: [facility]; Service: Orthopedics; Laterality: Left;   .  Back surgery      Family History   Problem  Relation  Age of Onset   .  Stroke  Mother    .  Early death  Father     Social History: reports that he has quit smoking. His smoking use included Cigarettes. He has a 5.5 pack-year smoking history. He has never used smokeless tobacco. He reports that he drinks alcohol. He reports that he does not use illicit drugs.  Allergies:  Allergies   Allergen  Reactions   .  Ciprofloxacin  Nausea Only     REACTION: GI upset   .  Other      SCOLLOPS--SUDDEN GI ATTACK    Medications Prior to Admission   Medication  Sig  Dispense  Refill   .  amLODipine (NORVASC) 5 MG tablet  Take 5 mg by mouth daily.     Marland Kitchen  atorvastatin (LIPITOR) 20 MG tablet  Take 20 mg by mouth daily. TAKE ONE TABLET BY MOUTH DAILY     .  citalopram (CELEXA) 20 MG tablet  Take 1 tablet (20 mg total) by mouth daily.  30 tablet  3   .  clotrimazole-betamethasone (LOTRISONE) cream  Apply 1 application topically 2 (two) times daily. For psoriasis.     Marland Kitchen  diclofenac sodium (VOLTAREN) 1 % GEL  Apply 1 application topically 2 (two) times daily as needed. Arthritis in both thumbs.     .  esomeprazole (NEXIUM) 40 MG capsule  Take 1 capsule (40 mg total) by mouth 2 (two) times daily.  60 capsule  3   .  ezetimibe (ZETIA) 10 MG tablet  Take 10 mg by mouth daily.     .  iron polysaccharides (NIFEREX) 150 MG capsule  Take 150 mg by mouth 2 (two) times daily.     .  methotrexate (RHEUMATREX) 2.5 MG tablet  TAKE FOUR TABLETS BY MOUTH ONCE A WEEK.  16 tablet  10   .  methotrexate (RHEUMATREX) 2.5 MG tablet  Take 10 mg by mouth once a week. Caution:Chemotherapy. Protect from light. Pt takes 4 tablets on Monday of each week     .  MYRBETRIQ 50 MG TB24 tablet  Take 50 mg by mouth daily.     .  nabumetone (RELAFEN) 750 MG  tablet  TAKE TWO TABLETS DAILY  60 tablet  5   .  nabumetone (RELAFEN) 750 MG tablet  Take 750 mg  by mouth 2 (two) times daily.     .  OXYBUTYNIN CHLORIDE PO  Take 5 mg by mouth 2 (two) times daily.     .  predniSONE (DELTASONE) 5 MG tablet  Take 5 mg by mouth daily.     Marland Kitchen  rOPINIRole (REQUIP) 2 MG tablet  Take 2 mg by mouth at bedtime.     Marland Kitchen  rOPINIRole (REQUIP) 2 MG tablet  Take 2 mg by mouth 2 (two) times daily.     Marland Kitchen  triamcinolone cream (KENALOG) 0.1 %  APPLY TO AFFECTED AREAS AS DIRECTED  480 g  3   .  VESICARE 10 MG tablet  Take 10 mg by mouth daily.     .  Cyanocobalamin 500 MCG/0.1ML SOLN  Place 500 mcg into the nose once a week.      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 Wife and patient advised to have second person in charge start communicating on patients behalf at this time to help business run due to the need to have therapy.  Functional Status:  Mobility:  Bed Mobility  Bed Mobility: Sitting - Scoot to Edge of Bed;Sit to Supine;Supine to Sit  Supine to Sit: 4: Min guard;With rails;HOB elevated  Sitting - Scoot to Edge of Bed: 4: Min guard  Sit to Supine: 4: Min guard  Transfers  Transfers: Sit to Stand;Stand to Sit  Sit to Stand: 4: Min assist;With upper extremity assist;From bed  Stand to Sit: 4: Min assist;With upper extremity assist;To bed  Ambulation/Gait   Ambulation/Gait Assistance: 3: Mod assist  Ambulation Distance (Feet): 240 Feet  Assistive device: 1 person hand held assist  Ambulation/Gait Assistance Details: patient with varying instability throughout with multiple LOBs and staggering gait, patient running into chairs, objects and walls on left side. During ambulation, patient given tasks to looks for room and navigate throughout halls. Patient unaware of any rooms/room numbers on his left side. Significant inattention causing need for multiple VCs and tactile cues.  Gait Pattern: Step-through pattern;Scissoring;Narrow base of support  Gait velocity: decreased  General Gait Details: very unsteady requires assist to prevent LOB and fall, patient not attending to left side making RW possiblity questionable   ADL:  ADL  Eating/Feeding: Minimal assistance (wife states "he only ate the right side of his plate")  Where Assessed - Eating/Feeding: Bed level  Grooming: Wash/dry hands;Wash/dry face;Teeth care;Minimal assistance  Where Assessed - Grooming: Unsupported standing  Upper Body Bathing: Chest;Right arm;Left arm;Abdomen;Minimal assistance  Where Assessed - Upper Body Bathing: Unsupported sitting (relies on UE on counter for support)  Lower Body Bathing: Minimal assistance  Where Assessed - Lower Body Bathing: Supported standing  Upper Body Dressing: Minimal assistance  Where Assessed - Upper Body Dressing: Supported sit to stand  Lower Body Dressing: Moderate assistance  Where Assessed - Lower Body Dressing: Supported sit to Scientist, research (life sciences): Moderate assistance  Toilet Transfer Method: Sit to stand  Acupuncturist: Regular height toilet;Grab bars  Equipment Used: Gait belt  Transfers/Ambulation Related to ADLs: Pt demonstrates decr gait velocity and needs v/c for Lt inattention.pt needs cues to turn head to scan to the left to prevent walking into doors / door ways  ADL Comments: Pt completed adl at sink level this  AM. pt educated prior to ambulation to the bathroom that all necessary items are in the bathroom. He should not have to ask for any items. He will have to find the items. PT expressed understanding. Pt needed BIL socks half don and (A) to pull sock over the heels. Pt unsteady sit<>stand Min (A). pt ambulated to bathroom. pt neglecting all objects on the left. Pt knocking object off and hearing it hit. pt state "what was that?" Pt unaware he had knocked his bathroom kit off the counter with left hand. pt with a new cut on left UE this session from injury after yesterdays education. pt demonstrates AROM wrist this session wtih gravity eliminated. Pt remains without digit flexion/ extension. Pt and wife very excited for wrist AROM. Pt provided comb to brush hair. Pt asked "how am I suppose to see?" Pt cued there is a mirror in the room. Pt laughed and said "its on the left isnt it" Pt verbalizing this statement demonstrates incr awareness of left side from education. This carry over is why patient is an excellent CIR candidate. Pt reaching for objects with Lt hand and unable to hold / grasp. Pt educated on using Lt UE stablized on sink counter to hold tooth brush and apply paste with Rt UE. pt very pleased with being able to initiate task with increaseing independence. pt dropping objects throughout session with multiple attempts. Pt progressing with OT . Pt with new bandages applied to Lt hand wounds. WIfe and patient educated that RN may need to redress sites later today or tomorrow if needed. Pt with skin tear upper arm that requires large bandage and very long skin tear at 3rd 4th 5th MCP.  Cognition:  Cognition  Overall Cognitive Status: Impaired/Different from baseline  Arousal/Alertness: Awake/alert  Orientation Level: Oriented X4  Attention: Sustained  Sustained Attention: (decreased attention to left)  Memory: (impaired prospective memory and short term memory)  Awareness: Impaired (aware to left  inattention after planned failure)  Awareness Impairment: Intellectual impairment;Emergent impairment  Problem Solving: Appears intact (for basic environment/math, inattn may impact function)  Executive Function: (tba)  Safety/Judgment: Appears intact (nurse tech reports pt frustrated w/ not getting oob alone)  Cognition  Arousal/Alertness: Awake/alert  Behavior During Therapy: WFL for tasks assessed/performed  Overall Cognitive Status: Impaired/Different from baseline  Area of Impairment: Safety/judgement;Awareness  Safety/Judgement: Decreased awareness of safety;Decreased awareness of deficits  Awareness: Anticipatory  General Comments: Pt with left inattention and demonstrates some increase awareness. Pt remains with v/c to attention left. Pt asking wife and other visitors to sit on the left to him help turn his head. Pt with good carry through from education provided in evaluation.    Physical Exam:  Blood pressure 163/79, pulse 68, temperature 98.6 F (37 C), temperature source Oral, resp. rate 18, height 6\' 3"  (1.905 m), weight 102.286 kg (225 lb 8 oz), SpO2 93.00%.  Physical Exam  Constitutional: He is oriented to person, place, and time. He appears well-developed. Sitting comfortably up in his chair.  HENT:  Head: Normocephalic.  Eyes:  Pupils round and reactive to light  Neck: Normal range of motion. Neck supple. No thyromegaly present.  Cardiovascular:  Cardiac rate control. No murmur rub or gallop Respiratory: Effort normal and breath sounds normal. No respiratory distress. No wheezes rubs or gallops GI: Soft. Bowel sounds are normal. He exhibits no distension.  Neurological: He is alert and oriented to person, place, and time.  Patient with some decreased awareness of left-sided deficits. He does follow simple commands. Speech is dysarthric. Left facial droop.  Left inattention noted. Might have mild left partial field deficit (inconsistent however, and I favor more  inattention). LUE is 1 deltoid, tric, bic, WE is tr to 1.  HI are tr.  LLE is 2/5 HF, KE, 2+ at ankles . Senses pain consistently left upper and lower.  Skin: Skin is warm and dry.  Multiple lacs, ecchymoses LUE  Psychiatric:  Fair, basic insight and awareness    Results for orders placed during the hospital encounter of 04/03/13 (from the past 48 hour(s))   HEMOGLOBIN A1C Status: Abnormal    Collection Time    04/03/13 2:43 PM   Result  Value  Range    Hemoglobin A1C  5.8 (*)  <5.7 %    Comment:  (NOTE)         According to the ADA Clinical Practice Recommendations for 2011, when     HbA1c is used as a screening test:     >=6.5% Diagnostic of Diabetes Mellitus     (if abnormal result is confirmed)     5.7-6.4% Increased risk of developing Diabetes Mellitus     References:Diagnosis and Classification of Diabetes Mellitus,Diabetes     Care,2011,34(Suppl 1):S62-S69 and Standards of Medical Care in     Diabetes - 2011,Diabetes Care,2011,34 (Suppl 1):S11-S61.    Mean Plasma Glucose  120 (*)  <117 mg/dL    Comment:  Performed at [facility]   TSH Status: None    Collection Time    04/03/13 2:43 PM   Result  Value  Range    TSH  2.864  0.350 - 4.500 uIU/mL    Comment:  Performed at [facility]   VITAMIN B12 Status: None    Collection Time    04/03/13 2:43 PM   Result  Value  Range    Vitamin B-12  509  211 - 911 pg/mL    Comment:  Performed at [facility]   HEMOGLOBIN A1C Status: None    Collection Time    04/04/13 5:30 AM   Result  Value  Range    Hemoglobin A1C  5.5  <5.7 %    Comment:  (NOTE)         According to the ADA Clinical Practice Recommendations for 2011, when     HbA1c is used as a screening test:     >=6.5% Diagnostic of Diabetes Mellitus     (if abnormal result is confirmed)     5.7-6.4% Increased risk of developing Diabetes Mellitus     References:Diagnosis and Classification of Diabetes Mellitus,Diabetes     Care,2011,34(Suppl  1):S62-S69 and Standards of Medical Care in     Diabetes - 2011,Diabetes Care,2011,34 (Suppl 1):S11-S61.    Mean Plasma Glucose  111  <117 mg/dL    Comment:  Performed at [facility]   CBC Status: Abnormal    Collection Time    04/04/13 5:30 AM   Result  Value  Range    WBC  10.7 (*)  4.0 - 10.5 K/uL    RBC  4.53  4.22 - 5.81 MIL/uL  Hemoglobin  14.6  13.0 - 17.0 g/dL    HCT  16.1  09.6 - 04.5 %    MCV  93.8  78.0 - 100.0 fL    MCH  32.2  26.0 - 34.0 pg    MCHC  34.4  30.0 - 36.0 g/dL    RDW  40.9  81.1 - 91.4 %    Platelets  196  150 - 400 K/uL   BASIC METABOLIC PANEL Status: Abnormal    Collection Time    04/04/13 5:30 AM   Result  Value  Range    Sodium  144  135 - 145 mEq/L    Potassium  3.8  3.5 - 5.1 mEq/L    Chloride  110  96 - 112 mEq/L    CO2  24  19 - 32 mEq/L    Glucose, Bld  104 (*)  70 - 99 mg/dL    BUN  14  6 - 23 mg/dL    Creatinine, Ser  7.82  0.50 - 1.35 mg/dL    Calcium  8.9  8.4 - 10.5 mg/dL    GFR calc non Af Amer  79 (*)  >90 mL/min    GFR calc Af Amer  >90  >90 mL/min    Comment:  (NOTE)     The eGFR has been calculated using the CKD EPI equation.     This calculation has not been validated in all clinical situations.     eGFR's persistently <90 mL/min signify possible Chronic Kidney     Disease.   LIPID PANEL Status: Abnormal    Collection Time    04/04/13 5:30 AM   Result  Value  Range    Cholesterol  169  0 - 200 mg/dL    Triglycerides  956 (*)  <150 mg/dL    HDL  50  >21 mg/dL    Total CHOL/HDL Ratio  3.4     VLDL  31  0 - 40 mg/dL    LDL Cholesterol  88  0 - 99 mg/dL    Comment:      Total Cholesterol/HDL:CHD Risk     Coronary Heart Disease Risk Table     Men Women     1/2 Average Risk 3.4 3.3     Average Risk 5.0 4.4     2 X Average Risk 9.6 7.1     3 X Average Risk 23.4 11.0         Use the calculated Patient Ratio     above and the CHD Risk Table     to determine the patient's CHD Risk.         ATP III CLASSIFICATION  (LDL):     <100 mg/dL Optimal     308-657 mg/dL Near or Above     Optimal     130-159 mg/dL Borderline     846-962 mg/dL High     >952 mg/dL Very High    Ct Angio Head W/cm &/or Wo Cm  04/04/2013 CLINICAL DATA: 78 year old male with recent right basal ganglia infarct. Presented with left-sided weakness. Subsequent encounter. EXAM: CT ANGIOGRAPHY HEAD TECHNIQUE: Multidetector CT imaging of the head was performed using the standard protocol during bolus administration of intravenous contrast. Multiplanar CT image reconstructions including MIPs were obtained to evaluate the vascular anatomy. CONTRAST: 50mL OMNIPAQUE IOHEXOL 350 MG/ML SOLN COMPARISON: Intracranial MRA 01/15/2010, 11/05/2006. FINDINGS: Left chest cardiac pacemaker on scout views. Stable cytotoxic edema in the right caudate and anterior lentiform  nuclei. No associated enhancement. Stable mild mass effect on the right frontal horn. No ventriculomegaly. Also, the small focus of cortical or subcortical white matter edema in the right parietal lobe is confirmed (series 8, image 21). No mass effect at that site. Stable and normal right hemisphere gray-white matter differentiation elsewhere. No acute intracranial hemorrhage identified. No midline shift, mass effect, or evidence of intracranial mass lesion. No abnormal enhancement identified. Stable visualized osseous structures. Stable paranasal sinuses and mastoids. Stable orbit and scalp soft tissues. VASCULAR FINDINGS: Major intracranial venous structures are enhancing. Patent distal vertebral arteries, the left is dominant and the right functionally terminates in PICA. Normal left PICA origin. Patent basilar artery remarkable for a AE mid basilar fenestration. No stenosis. SCA and PCA origins are within normal limits. Normal right posterior communicating artery, the left is diminutive or absent. Bilateral PCA branches are within normal limits. Negative distal cervical ICAs. Bilateral ICA siphon  calcified atherosclerosis. No hemodynamically significant stenosis results. Ophthalmic artery origins are within normal limits. Normal right posterior communicating artery origin. Normal carotid termini, MCA and ACA origins. Mildly dominant left ACA A1 segment. Normal anterior communicating artery and visualized ACA branches. Normal left MCA branches. Right MCA M1 segment is patent and within normal limits. Right MCA bifurcation is patent the and within normal limits. No right MCA branch occlusion identified. Review of the MIP images confirms the above findings. IMPRESSION: 1. Stable right MCA territory infarcts with cytotoxic edema in the right basal ganglia and involving a small area of the right parietal lobe. No associated mass effect or hemorrhage. 2. No right MCA stenosis or major branch occlusion identified. Negative anterior circulation except for bilateral ICA siphon calcified plaque without hemodynamically significant stenosis. 3. Negative posterior circulation. Electronically Signed By: Augusto Gamble M.D. On: 04/04/2013 18:18  Dg Chest 2 View  04/03/2013 CLINICAL DATA: Stroke. Weakness. Hypertension. EXAM: CHEST 2 VIEW COMPARISON: None. FINDINGS: The heart size and mediastinal contours are within normal limits. Both lungs are clear. No pleural effusion or pneumothorax. Left anterior chest wall sequential pacemaker is stable in well positioned. The bony thorax is demineralized but intact. IMPRESSION: No active cardiopulmonary disease. Electronically Signed By: Amie Portland M.D. On: 04/03/2013 13:32  Ct Head Wo Contrast  04/04/2013 CLINICAL DATA: Followup. EXAM: CT HEAD WITHOUT CONTRAST TECHNIQUE: Contiguous axial images were obtained from the base of the skull through the vertex without intravenous contrast. COMPARISON: 04/03/2013 FINDINGS: No acute osseous findings. Mild inflammatory mucosal thickening in the paranasal sinuses. Opacified, expanded air cell in the posterior left mastoid region. Bilateral  proptosis related to increase in retro-orbital fat. Newly seen low-attenuation in the right caudate head, anterior right putamen, and intervening internal capsule. There is new subcortical low-attenuation in the right parietal region, 2 cm in maximal span. No contralateral ischemia detected. No hemorrhagic complication. Critical Value/emergent results were called by telephone at the time of interpretation on 04/04/2013 at 6:39 AM to [facility], who verbally acknowledged these results. IMPRESSION: 1. Newly evident infarct of the right caudate head and anterior putamen. Likely 2nd focus of acute infarction involving the subcortical right parietal lobe. Multi focal, unilateral infarcts would favor thromboembolic disease from the right carotid system. 2. No complicating hemorrhage. Electronically Signed By: Tiburcio Pea M.D. On: 04/04/2013 06:49   Post Admission Physician Evaluation:  1. Functional deficits secondary to embolic right parietal and caudate infarcts . 2. Patient is admitted to receive collaborative, interdisciplinary care between the physiatrist, rehab nursing staff, and therapy team. 3. Patient's level of medical  complexity and substantial therapy needs in context of that medical necessity cannot be provided at a lesser intensity of care such as a SNF. 4. Patient has experienced substantial functional loss from his/her baseline which was documented above under the "Functional History" and "Functional Status" headings. Judging by the patient's diagnosis, physical exam, and functional history, the patient has potential for functional progress which will result in measurable gains while on inpatient rehab. These gains will be of substantial and practical use upon discharge in facilitating mobility and self-care at the household level. 5. Physiatrist will provide 24 hour management of medical needs as well as oversight of the therapy plan/treatment and provide guidance as appropriate regarding the  interaction of the two. 6. 24 hour rehab nursing will assist with bladder management, bowel management, safety, skin/wound care, disease management, medication administration and patient education and help integrate therapy concepts, techniques,education, etc. 7. PT will assess and treat for/with: Lower extremity strength, range of motion, stamina, balance, functional mobility, safety, adaptive techniques and equipment, NMR, education. Goals are: supervision to mod I. 8. OT will assess and treat for/with: ADL's, functional mobility, safety, upper extremity strength, adaptive techniques and equipment, NMR, education. Goals are: mod I to supervision. 9. SLP will assess and treat for/with:  Communication, swallowing, cognition. Goals are: supervision to mod I. 10. Case Management and Social Worker will assess and treat for psychological issues and discharge planning. 11. Team conference will be held weekly to assess progress toward goals and to determine barriers to discharge. 12. Patient will receive at least 3 hours of therapy per day at least 5 days per week. 13. ELOS: 7-10 days  14. Prognosis: excellent Medical Problem List and Plan:  1. Embolic right parietal and caudate infarct  2. DVT Prophylaxis/Anticoagulation: Continues Xarelto  3. Pain Management: Tylenol as needed  4. Mood: Celexa 20 mg daily. Provide emotional support  5. Neuropsych: This patient is capable of making decisions on his own behalf.  6. Paroxysmal atrial fibrillation/pacemaker. Continue Xarelto. Cardiac rate control.  7. Hypertension. Norvasc 10 mg daily. Monitor with increased mobility  8. Hyperlipidemia. Continue Lipitor, Zetia  9. Psoriatic arthritis. Continue prednisone. Discuss resuming weekly methotrexate  10. BPH/TURP. Ditropan 5 mg twice a day, Enablex 7.5 mg daily and MYRBETRIQ 50 mg daily. Check PVRs x3   Ranelle Oyster, MD, [facility] Physical Medicine [facility] Rehabilitation   04/05/2013

## 2013-04-06 NOTE — Progress Notes (Signed)
Occupational Therapy Treatment Patient Details Name: Christopher Baker MRN: [id number] DOB: December 28, 1933 Today's Date: 04/06/2013 Time: 5621-3086 OT Time Calculation (min): 56 min  OT Assessment / Plan / Recommendation  History of present illness 79 y.o. male with Lt facial droop and s/p fall. Pt with hx of ypertension, hyperlipidemia, paroxysmal atrial fibrillation status post pacemaker (currently no receiving any anticoagulation therapy), GERD, psoriasis and history of carotid artery stenosis.CT  Newly evident infarct of the right caudate head and anterior putamen. Likely 2nd focus of acute infarction involving the subcortical right parietal lobe. Multi focal, unilateral infarcts would favor thromboembolic disease from the right carotid system.  (Simultaneous filing. User may not have seen previous data.)   OT comments  Pt with acceptance to CIR and making great progress. Pt with finger flexion and minimal extension demonstrated in gravity eliminated  Follow Up Recommendations  CIR    Barriers to Discharge       Equipment Recommendations  3 in 1 bedside comode    Recommendations for Other Services Rehab consult;Other (comment)  Frequency Min 3X/week   Progress towards OT Goals Progress towards OT goals: Progressing toward goals  Plan Discharge plan remains appropriate    Precautions / Restrictions Precautions Precautions: Fall (Simultaneous filing. User may not have seen previous data.) Precaution Comments: Lt inattention (Simultaneous filing. User may not have seen previous data.)   Pertinent Vitals/Pain Lt wrist soreness Rt shoulder pain    ADL  Grooming: Min guard Where Assessed - Grooming: Unsupported standing (V/C TO locate paper towels on left side) Toilet Transfer: Min Pension scheme manager Method: Sit to Barista: Regular height toilet;Grab bars (preventing patient from striking door with left shoulder) Toileting - Clothing Manipulation and  Hygiene: Min guard Where Assessed - Toileting Clothing Manipulation and Hygiene: Sit to stand from 3-in-1 or toilet Equipment Used: Gait belt Transfers/Ambulation Related to ADLs: Pt ambulating with left inattention and needing cues to locate objects on the left side ADL Comments: Pt session focused on Lt UE: Pt sitting EOb and provided bed side tray. Pt educated on hand gliding across table for weight bearing and Rt UE helping left side awareness with proprioception input. Pt next demonstrates isolated wrist flexion and extension. Pt unable to abduction and adduction digits. Pt asked to perform digit flexion and extension. Initially patient only able to flex. Pt wtih extended time and effort started to show some digit extension. Pt loves to golf and its his motivation. Therapist obtained large pitcher, large squeeze ball and large handled cane. pt was required to grasp cane with left hand and right hand securing. Pt then had to putt toward left side to work on left inattention. Pt was about 40 % accurate from ~ 6 ft  or less away. Pt very motivated after putting    OT Diagnosis:    OT Problem List:   OT Treatment Interventions:     OT Goals(current goals can now be found in the care plan section) Acute Rehab OT Goals Patient Stated Goal: to be independent and high functioning, to play golf again (Simultaneous filing. User may not have seen previous data.) OT Goal Formulation: With patient/family Time For Goal Achievement: 04/18/13 Potential to Achieve Goals: Good ADL Goals Pt Will Perform Grooming: with min assist;standing Pt Will Perform Upper Body Bathing: with min assist;standing Pt Will Perform Lower Body Bathing: with min assist;sit to/from stand Pt Will Transfer to Toilet: with min assist;bedside commode Additional ADL Goal #1: Pt will locate all objects on  tray using compensatory strategies without verbal cues or moving objects to the right side of the tray Additional ADL Goal #2: Pt  will demonstrates LT hand HEP with wife MOD I  Visit Information  Last OT Received On: 04/06/13 Assistance Needed: +1 (Simultaneous filing. User may not have seen previous data.) History of Present Illness: 79 y.o. male with Lt facial droop and s/p fall. Pt with hx of ypertension, hyperlipidemia, paroxysmal atrial fibrillation status post pacemaker (currently no receiving any anticoagulation therapy), GERD, psoriasis and history of carotid artery stenosis.CT  Newly evident infarct of the right caudate head and anterior putamen. Likely 2nd focus of acute infarction involving the subcortical right parietal lobe. Multi focal, unilateral infarcts would favor thromboembolic disease from the right carotid system.  (Simultaneous filing. User may not have seen previous data.)    Subjective Data      Prior Functioning       Cognition  Cognition Arousal/Alertness: Awake/alert (Simultaneous filing. User may not have seen previous data.) Behavior During Therapy: [facility] for tasks assessed/performed (Simultaneous filing. User may not have seen previous data.) Overall Cognitive Status: Impaired/Different from baseline (Simultaneous filing. User may not have seen previous data.) Area of Impairment: Safety/judgement;Awareness (Simultaneous filing. User may not have seen previous data.) Safety/Judgement: Decreased awareness of safety;Decreased awareness of deficits (Simultaneous filing. User may not have seen previous data.) Awareness: Anticipatory (Simultaneous filing. User may not have seen previous data.) General Comments: Pt with left inattention and demonstrates some increase awareness. Pt remains with v/c to attention left. Pt asking wife and other visitors to sit on the left to him help turn his head. Pt with good carry through from education provided in evaluation. (Simultaneous filing. User may not have seen previous data.)    Mobility  Bed Mobility Bed Mobility: Supine to Sit;Sitting - Scoot to Edge of  Bed;Sit to Supine (Simultaneous filing. User may not have seen previous data.) Supine to Sit: 4: Min guard;With rails;HOB elevated Sitting - Scoot to Edge of Bed: 4: Min guard;With rail Sit to Supine: 4: Min guard;With rail;HOB elevated (Simultaneous filing. User may not have seen previous data.) Details for Bed Mobility Assistance: pt needed cues to reach for rails on left side of bed to incr independence. Pt initially reaching for therapist saying here help me (Simultaneous filing. User may not have seen previous data.) Transfers Transfers: Sit to Stand;Stand to Sit Sit to Stand: 4: Min guard;With upper extremity assist;From bed (Simultaneous filing. User may not have seen previous data.) Stand to Sit: 4: Min guard;With upper extremity assist;To toilet (Simultaneous filing. User may not have seen previous data.) Details for Transfer Assistance: assist for stability, Cues for safe technique and hand placement       Balance High Level Balance High Level Balance Activites: Side stepping;Backward walking;Direction changes;Turns;Head turns High Level Balance Comments: min guard to moderate assist with varying instability. Left inattention causing significant risk with ambulation.   End of Session OT - End of Session Activity Tolerance: Patient tolerated treatment well Patient left: in bed;with call bell/phone within reach;with family/visitor present Nurse Communication: Mobility status;Precautions  [name] asking therapist about concerns with communication and helping be a team player for patients recovery. Pt and wife have had a disagreement with communication over caregiver role wife must assume at this time. Pt views wife as a spouse and has expressed "your not my mother". Wife educated on communicating with patient and discussion of what he feels as successful and necessary. Wife after education expressed feeling better  and now understanding more from the patients perspective than from a wife  view point. Wife just wants to be apart of the healing process and communicating with patient well. Pt and wife are having a change of roles ( caregiver vs spouse) personal challenge that they both want to improve in  CIR     Harolyn Rutherford 04/06/2013, 3:43 PM Pager: [phone]

## 2013-04-06 NOTE — Progress Notes (Signed)
Stroke Team Progress Note  HISTORY Christopher Baker is an 79 y.o. male, right handed, with a past medical history significant for HTN, hyperlipidemia, atrial fibrilation s/p pacemaker placement, bilateral carotid stenosis (80% according to wife), brought to [facility] ED 04/03/2013 for evaluation of new onset symptoms. He said that he went to bed last night feeling well, but when she tried to get up the bed around 630 this morning " something was wrong with my left side, kind of numb and weak" and when he finally got out the bed went to the kitchen and tried to open a can of cat food just to realized that " my hand was not really working well". He tells me that he fell and had to struggle to get up. Furthermore, he said that around 7 or 730 am his wife noticed that the left side of his face was droopier than the right side. Never had similar symptoms before.  No HA, vertigo, double vision, difficulty swallowing, slurred speech, language or vision impairment. CT brain showed no acute abnormality.  Patient was not a TPA candidate secondary to waking with symptoms. He was admitted for further evaluation and treatment.  SUBJECTIVE He and his wife are in the glassed in lobby looking out the window.  OBJECTIVE Most recent Vital Signs: Filed Vitals:   04/05/13 2200 04/06/13 0100 04/06/13 0238 04/06/13 0637  BP: 150/58 181/59 172/64 166/70  Pulse: 64 64  61  Temp: 98.2 F (36.8 C) 98.1 F (36.7 C)  97.8 F (36.6 C)  TempSrc: Oral Oral  Oral  Resp: 20 20  18   Height:      Weight:      SpO2: 96% 95%  93%   CBG (last 3)  No results found for this basename: GLUCAP,  in the last 72 hours  IV Fluid Intake:     MEDICATIONS  . amLODipine  10 mg Oral Daily  . atorvastatin  20 mg Oral q1800  . citalopram  20 mg Oral Daily  . darifenacin  7.5 mg Oral Daily  . ezetimibe  10 mg Oral Daily  . iron polysaccharides  150 mg Oral BID  . mirabegron ER  50 mg Oral Daily  . oxybutynin  5 mg Oral BID  . pantoprazole   40 mg Oral BID  . predniSONE  5 mg Oral Q breakfast  . rivaroxaban  20 mg Oral Q supper  . rOPINIRole  2 mg Oral BID   PRN:  acetaminophen, diclofenac sodium  Diet:  Cardiac thin liquids Activity:    Up with assistance DVT Prophylaxis:  Heparin 5000 units sq tid   CLINICALLY SIGNIFICANT STUDIES Basic Metabolic Panel:   Recent Labs Lab 04/03/13 0855 04/03/13 0912 04/04/13 0530  NA 141 144 144  K 3.9 4.1 3.8  CL 106 106 110  CO2 27  --  24  GLUCOSE 106* 104* 104*  BUN 18 21 14   CREATININE 1.11 1.30 0.93  CALCIUM 9.0  --  8.9   Liver Function Tests:   Recent Labs Lab 04/03/13 0855  AST 23  ALT 27  ALKPHOS 87  BILITOT 0.2*  PROT 6.1  ALBUMIN 3.5   CBC:   Recent Labs Lab 04/03/13 0855 04/03/13 0912 04/04/13 0530  WBC 7.9  --  10.7*  NEUTROABS 5.9  --   --   HGB 15.2 15.3 14.6  HCT 43.6 45.0 42.5  MCV 94.0  --  93.8  PLT 198  --  196   Coagulation:  Recent Labs Lab 04/03/13 0855  LABPROT 12.5  INR 0.95   Cardiac Enzymes: No results found for this basename: CKTOTAL, CKMB, CKMBINDEX, TROPONINI,  in the last 168 hours Urinalysis:   Recent Labs Lab 04/03/13 0942  COLORURINE YELLOW  LABSPEC 1.015  PHURINE 6.5  GLUCOSEU NEGATIVE  HGBUR NEGATIVE  BILIRUBINUR NEGATIVE  KETONESUR NEGATIVE  PROTEINUR NEGATIVE  UROBILINOGEN 0.2  NITRITE NEGATIVE  LEUKOCYTESUR NEGATIVE   Lipid Panel    Component Value Date/Time   CHOL 169 04/04/2013 0530   TRIG 157* 04/04/2013 0530   HDL 50 04/04/2013 0530   CHOLHDL 3.4 04/04/2013 0530   VLDL 31 04/04/2013 0530   LDLCALC 88 04/04/2013 0530   HgbA1C  Lab Results  Component Value Date   HGBA1C 5.5 04/04/2013    Urine Drug Screen:     Component Value Date/Time   LABOPIA NONE DETECTED 04/03/2013 0942   COCAINSCRNUR NONE DETECTED 04/03/2013 0942   LABBENZ NONE DETECTED 04/03/2013 0942   AMPHETMU NONE DETECTED 04/03/2013 0942   THCU NONE DETECTED 04/03/2013 0942   LABBARB NONE DETECTED 04/03/2013 0942     Alcohol Level:   Recent Labs Lab 04/03/13 0855  ETH <11   CT of the brain   04/04/2013   1. Newly evident infarct of the right caudate head and anterior putamen. Likely 2nd focus of acute infarction involving the subcortical right parietal lobe. Multi focal, unilateral infarcts would favor thromboembolic disease from the right carotid system. 2. No complicating hemorrhage.    04/03/2013   No intracranial hemorrhage.  Small vessel disease type changes without CT evidence of large acute infarct. Streak artifact limits evaluation of the posterior fossa  CT angio of the head 1. Stable right MCA territory infarcts with cytotoxic edema in the right basal ganglia and involving a small area of the right parietal lobe. No associated mass effect or hemorrhage. 2. No right MCA stenosis or major branch occlusion identified. Negative anterior circulation except for bilateral ICA siphon  calcified plaque without hemodynamically significant stenosis. 3. Negative posterior circulation.  MRI/A of the brain  pacer  2D Echocardiogram  EF 50-55% with no source of embolus.   Carotid Doppler The right internal carotid artery demonstrates elevated peak systolic velocities suggestive of 60-79% stenosis, elevated end diastolic velocities suggestive of borderline 40-59% stenosis, and ICA/CCA ratio suggestive of borderline 80-99% stenosis.  The left internal carotid artery demonstrates elevated peak systolic velocities suggestive of 40-59% stenosis, elevated end diastolic velocities suggestive of borderline 40-59% stenosis, an elevated ICA/CCA ratio suggestive of 60-79% stenosis.  This report is different from the preliminary report. He has had previous carotid dopplers, all followed by Dr. Arbie Cookey.  CXR  04/03/2013   No active cardiopulmonary disease.   EKG  normal sinus rhythm.   Therapy Recommendations CIR  Physical Exam   Pleasant elderly Caucasian male currently not in distress.Awake alert.  Afebrile. Head is nontraumatic. Neck is supple without bruit. Hearing is normal. Cardiac exam no murmur or gallop. Lungs are clear to auscultation. Distal pulses are well felt. Neurological Exam :  Awake alert oriented x3 with normal speech and language function. No dysarthria or aphasia. Extraocular movements are full range without nystagmus. Mild left lower facial weakness. Tongue is midline. Motor system exam no upper or lower extremity drift but mild weakness of left hip flexors and ankle dorsiflexors 4/5. Significant weakness of left and intrinsic hand muscles and unable to bend fingers. No sensory loss. Gait was not tested. NIHSS 2  ASSESSMENT  Christopher Baker is a 79 y.o. male presenting with left hemiparesis-numbness, left face weakness. Imaging confirms a right cortical and subcortical infarcts (right caudate head, right parietal lobe). Infarct felt to be  embolic secondary to hx PAF (not on anticoagulation per Dr. Marlis Edelson conversation with Dr. Ladona Ridgel due to inconsistent appts, refusal of anticoagulation).  On no documented antithrombotics prior to admission. Now on xarelto for secondary stroke prevention. Patient with resultant left hemiparesis. Work up completed  Hypertension Hyperlipidemia, LDL 88, on lipitor PTA, now on lipitor, goal LDL < 100 (< 70 for diabetics) Pacemaker 2011 Hx PAF CAD Family hx stroke - mother Current smoker Right greater than left ICA stenosis - pt followed by Dr. Arbie Cookey prior to admission. Last doppler done in June. Preliminary carotid doppler reading showed bilat 40-60% stenosis. Formal reading showed right ICA could be 60-80% vs 40-60% when looking at velocities.   Hospital day # 3  TREATMENT/PLAN  Continue  xarelto for secondary stroke prevention.  Recommend VVS evaluate current doppler reports and compare with previously done reports, either as an IP or OP. If increased in right ICA stenosis, may need to consider CEA post rehab  Rehab when bed  available and insurance approval obtained. Patient has a 10-15% risk of having another stroke over the next year, the highest risk is within 2 weeks of the most recent stroke/TIA (risk of having a stroke following a stroke or TIA is the same). Ongoing risk factor control by Primary Care Physician Stroke Service will sign off. Please call should any needs arise. Follow up with Dr. Pearlean Brownie, Stroke Clinic, in 2 months.   Dr. Pearlean Brownie discussed diagnosis, prognosis,  treatment options and plan of care with lady at bedside, pt and Dr. Cena Benton.   Annie Main, MSN, RN, ANVP-BC, ANP-BC, [facility] Pager: [phone] 04/06/2013 9:28 AM  I have personally obtained a history, examined the patient, evaluated imaging results, and formulated the assessment and plan of care. I agree with the above. Delia Heady, MD

## 2013-04-06 NOTE — Progress Notes (Signed)
SLP Cancellation Note  Patient Details Name: Christopher Baker MRN: [id number] DOB: 1934/04/21   Cancelled treatment:       Reason Eval/Treat Not Completed: Patient at procedure or test/unavailable Celia B. Murvin [facility], CCC-SLP [phone]  Leigh Aurora 04/06/2013, 3:02 PM

## 2013-04-06 NOTE — Progress Notes (Addendum)
Physical Therapy Treatment Patient Details Name: Christopher Baker MRN: [id number] DOB: 1934-04-20 Today's Date: 04/06/2013 Time: 2841-3244 PT Time Calculation (min): 27 min  PT Assessment / Plan / Recommendation  History of Present Illness 79 y.o. male with Lt facial droop and s/p fall. Pt with hx of ypertension, hyperlipidemia, paroxysmal atrial fibrillation status post pacemaker (currently no receiving any anticoagulation therapy), GERD, psoriasis and history of carotid artery stenosis.CT  Newly evident infarct of the right caudate head and anterior putamen. Likely 2nd focus of acute infarction involving the subcortical right parietal lobe. Multi focal, unilateral infarcts would favor thromboembolic disease from the right carotid system.    PT Comments   Patient demonstrates some improvements compared to prior session but continues with deficits as indicated below. Patient still presenting with inattention to left side during ambulation and activites. Patient continues to run into objects placed in path, requires verbal and multi modal cues for compensation techniques. Pt continues to demonstrate with instability during head turns and decreased self monitoring of speed and safety as he makes rapid turns with decreased balance. Worked with patient on cognition during ambulation activities (see below). Patient demonstrates some improvements with problem solving techniques at basic levels. Continue to feel patient is ideal candidate for CIR at this time.     Follow Up Recommendations  CIR     Does the patient have the potential to tolerate intense rehabilitation     Barriers to Discharge        Equipment Recommendations   (TBD)    Recommendations for Other Services Rehab consult  Frequency Min 3X/week   Progress towards PT Goals Progress towards PT goals: Progressing toward goals  Plan Current plan remains appropriate    Precautions / Restrictions Precautions Precautions: Fall Precaution  Comments: Lt inattention   Pertinent Vitals/Pain No pain reported    Mobility  Bed Mobility Bed Mobility: Sit to Supine Sit to Supine: 4: Min guard Details for Bed Mobility Assistance: MinGuard for safety Transfers Transfers: Sit to Stand;Stand to Sit Sit to Stand: 4: Min assist;With upper extremity assist;From bed Stand to Sit: 4: Min assist;With upper extremity assist;To bed; Details for Transfer Assistance: assist for stability, Cues for safe technique and hand placement Ambulation/Gait Ambulation/Gait Assistance: 4: Min assist Ambulation Distance (Feet): 800 Feet Assistive device: 1 person hand held assist Ambulation/Gait Assistance Details: Continued instability and inattention to objects on left side causing decreased balance and safety Gait Pattern: Step-through pattern;Scissoring;Narrow base of support Gait velocity: improving    Exercises Other Exercises Other Exercises: Cognition exercises during functional mobility.  Scavenger hunt activities searching for various objects, rooms, and land marks throughout unit. Patient performed "leaf" counting of artwork with cues for leaves on left side, (21/24 initially then 24/24 with verbal cues). Patient walked past room her was searching for but was able to self correct to determine he had passed the room.  Patient able to carryover sequencing of short term events and report back.      PT Goals (current goals can now be found in the care plan section) Acute Rehab PT Goals Patient Stated Goal: to be independent and high functioning, to play golf again PT Goal Formulation: With patient/family Time For Goal Achievement: 04/18/13 Potential to Achieve Goals: Good  Visit Information  Last PT Received On: 04/06/13 Assistance Needed: +1 History of Present Illness: 79 y.o. male with Lt facial droop and s/p fall. Pt with hx of ypertension, hyperlipidemia, paroxysmal atrial fibrillation status post pacemaker (currently no receiving any  anticoagulation therapy), GERD, psoriasis and history of carotid artery stenosis.CT  Newly evident infarct of the right caudate head and anterior putamen. Likely 2nd focus of acute infarction involving the subcortical right parietal lobe. Multi focal, unilateral infarcts would favor thromboembolic disease from the right carotid system.     Subjective Data  Patient Stated Goal: to be independent and high functioning, to play golf again   Cognition  Cognition Arousal/Alertness: Awake/alert Behavior During Therapy: WFL for tasks assessed/performed Overall Cognitive Status: Impaired/Different from baseline Area of Impairment: Safety/judgement;Awareness Safety/Judgement: Decreased awareness of safety;Decreased awareness of deficits Awareness: Anticipatory General Comments: Pt with left inattention and demonstrates some increase awareness. Pt remains with v/c to attention left. Pt asking wife and other visitors to sit on the left to him help turn his head. Pt with good carry through from education provided in evaluation.    Balance  High Level Balance High Level Balance Activites: Side stepping;Backward walking;Direction changes;Turns;Head turns High Level Balance Comments: min guard to moderate assist with varying instability. Left inattention causing significant risk with ambulation.  End of Session PT - End of Session Equipment Utilized During Treatment: Gait belt Activity Tolerance: Patient tolerated treatment well Patient left: with call bell/phone within reach;with family/visitor present;in bed Nurse Communication: Mobility status   GP     Fabio Asa 04/06/2013, 3:42 PM Charlotte Crumb, PT DPT  [phone]

## 2013-04-06 NOTE — Progress Notes (Signed)
Rehab admissions - I continue to wait for [facility] insurance case manager to call regarding possible inpatient rehab admission.  I have called and left a message with the insurance case manager today.  Call me for questions.  #[phone]

## 2013-04-06 NOTE — Clinical Documentation Improvement (Signed)
THIS DOCUMENT IS NOT A PERMANENT PART OF THE MEDICAL RECORD  Please update your documentation within the medical record to reflect your response to this query. If you need help knowing how to do this please call [phone].  04/06/13  Dear Jasmine December,  In a better effort to capture your patient's severity of illness, reflect appropriate length of stay and utilization of resources, a review of the medical record has revealed the following indicators.   Based on your clinical judgment, please clarify and document in a progress note and/or discharge summary the clinical condition associated with the following supporting information: In responding to this query please exercise your independent judgment.  The fact that a query is asked, does not imply that any particular answer is desired or expected.   Hello Jasmine December!  Abnormal findings (laboratory, x-ray, pathologic, and other diagnostic results) are not coded and reported unless the physician indicates their clinical significance.  The medical record reflects the following clinical findings, please clarify the diagnostic and/or clinical significance:      CT Angio Head 10/15:  IMPRESSION:  1. Stable right MCA territory infarcts with cytotoxic edema in the right basal ganglia and involving a small area of the right parietal lobe. No associated mass effect or hemorrhage.  Possible Clinical Conditions?  - Cytotoxic Edema  - Cerebral Edema  - Other condition (please document in the progress notes and/or discharge summary)  - Cannot Clinically determine at this time      Reviewed:  no additional documentation provided - all strokes are technically defined as cytotoxic cerebral edema. Without shift or mass effect, i do not think the diagonsis supports the intent. If you think otherwise, let me know. May need to address globally.   Thank You,  Saul Fordyce  Clinical Documentation Specialist: [phone] Health Information  Management [facility]

## 2013-04-07 ENCOUNTER — Inpatient Hospital Stay (HOSPITAL_COMMUNITY): Payer: Medicare Other | Admitting: Physical Therapy

## 2013-04-07 ENCOUNTER — Inpatient Hospital Stay (HOSPITAL_COMMUNITY): Payer: Medicare Other | Admitting: Speech Pathology

## 2013-04-07 ENCOUNTER — Inpatient Hospital Stay (HOSPITAL_COMMUNITY): Payer: Medicare Other | Admitting: *Deleted

## 2013-04-07 DIAGNOSIS — I6350 Cerebral infarction due to unspecified occlusion or stenosis of unspecified cerebral artery: Secondary | ICD-10-CM

## 2013-04-07 MED ORDER — MIRABEGRON ER 50 MG PO TB24
50.0000 mg | ORAL_TABLET | Freq: Every day | ORAL | Status: DC
  Filled 2013-04-07: qty 1

## 2013-04-07 MED ORDER — MIRABEGRON ER 25 MG PO TB24: 25.0000 mg | ORAL_TABLET | Freq: Every day | ORAL | Status: DC

## 2013-04-07 MED ORDER — ROPINIROLE HCL 1 MG PO TABS
2.0000 mg | ORAL_TABLET | Freq: Every day | ORAL | Status: DC
  Administered 2013-04-08 – 2013-04-13 (×6): 2 mg via ORAL
  Filled 2013-04-07 (×7): qty 2

## 2013-04-07 NOTE — Progress Notes (Signed)
Dr. Amador Cunas notified of patient's and patient's wife request of medication switches.  Orders received: d/c Ditropan; switch Requip from 0500 to 1700; start Vesicare 10 mg 1 tab q 2000 and Myrbetriq 50 mg 1 tab q 2000 per patient request request.

## 2013-04-07 NOTE — Progress Notes (Signed)
HPI: Christopher Baker is a 79 y.o. right-handed male with history of hypertension,paroxysmal atrial fibrillation with pacemaker. Patient on no anti-coagulation prior to admission secondary to inconsistent appointments and refused with anticoagulation.. Admitted 04/03/2013 with left-sided weakness and facial droop. Cranial CT scan showed right cortical and subcortical infarcts(right caudate head, right parietal lobe). Infarct felt to be embolic secondary to history of PAF. MRI not completed secondary to pacemaker. Carotid Doppler showed bilateral ICA stenosis 40-59%. Echocardiogram with ejection fraction 55% grade 1 diastolic dysfunction no wall motion abnormalities. CT angiogram head negative for posterior circulation without stenosis or major occlusion. Patient did not receive TPA. Neurology service followup recommendations of Xarelto for CVA prophylaxis. Patient is maintained on a regular diet. Patient maintained on chronic prednisone therapy for history of psoriatic arthritis as well as methotrexate. Physical and occupational therapy evaluations completed an ongoing with recommendations for physical medicine rehabilitation consult to consider inpatient rehabilitation services. Patient was felt to be a good candidate for inpatient rehabilitation services and was admitted for comprehensive rehabilitation program  Subj:no specific complaints, no pain except for ongoing right shoulder discomfort. This has been ongoing for years. He thinks maybe somewhat worse after falls related to his recent stroke.  obj: BP 168/76  Pulse 64  Temp(Src) 98 F (36.7 C) (Oral)  Resp 18  Ht 6\' 3"  (1.905 m)  Wt 227 lb 4.7 oz (103.1 kg)  BMI 28.41 kg/m2  SpO2 96%  Pleasant male in no acute distress. Chest clear to auscultation. Cardiac exam S1-S2 are regular. Abdominal exam overweight, and bowel sounds. Extremities no edema. Musculoskeletal he has decreased abduction of the right shoulder. A/P Medical Problem List and  Plan:  1. Embolic right parietal and caudate infarct  2. DVT Prophylaxis/Anticoagulation: Continues Xarelto  3. Pain Management: Tylenol as needed  4. Mood: Celexa 20 mg daily. Provide emotional support  5. Neuropsych: This patient is capable of making decisions on his own behalf.  6. Paroxysmal atrial fibrillation/pacemaker. Continue Xarelto. Cardiac rate control.  7. Hypertension. Norvasc 10 mg daily. Monitor with increased mobility current blood pressure 149-168/72-76 (April 07, 2013) 8. Hyperlipidemia. Continue Lipitor, Zetia  9. Psoriatic arthritis. Continue prednisone. Discuss resuming weekly methotrexate  10. BPH/TURP. Ditropan 5 mg twice a day, Enablex 7.5 mg daily and MYRBETRIQ 50 mg daily. Check PVRs x3  11. Adhesive capsulitis right shoulder. Probably best for outpatient evaluation.

## 2013-04-07 NOTE — Progress Notes (Signed)
Physical Therapy Session Note  Patient Details  Name: Christopher Baker MRN: [id number] Date of Birth: [birth year]  Today's Date: 04/07/2013 Time: 4098-1191 Time Calculation (min): 30 min  Short Term Goals: Week 1:  PT Short Term Goal 1 (Week 1): Short term goals equal long term goals  Skilled Therapeutic Interventions/Progress Updates:  Pt was seen bedside in the pm. Pt complaining of pain B 1st MTP joints, redness noted, nursing medicated prior to tx. Pt ambulated distances of 100, 150 and 750 feet with S to min guard, no increased c/o pain with ambulation. Pt ambulates with wide BOS, step through gait pattern, occasional verbal cues to safety.   Therapy Documentation Precautions:  Precautions Precautions: Fall;Other (comment) Precaution Comments: Left inattention  Restrictions Weight Bearing Restrictions: No General:   Vital Signs:  Pain: Pt c/o pain B 1st MTP joints, nurse at bedside to provide medication, pain 7/10.  See FIM for current functional status  Therapy/Group: Individual Therapy  Rayford Halsted 04/07/2013, 3:47 PM

## 2013-04-07 NOTE — Evaluation (Signed)
Occupational Therapy Assessment and Plan  Patient Details  Name: Christopher Baker MRN: [id number] Date of Birth: 1933-07-10  OT Diagnosis: cognitive deficits, hemiplegia affecting non-dominant side and muscle weakness (generalized) Rehab Potential: Rehab Potential: Good ELOS: 5-7 days   Today's Date: 04/07/2013 Time: 0800- 0900    Time Calculation (min): 60 min  Problem List:  Patient Active Problem List   Diagnosis Date Noted  . CVA (cerebral infarction) 04/03/2013  . Occlusion and stenosis of carotid artery without mention of cerebral infarction 11/21/2012  . OA (osteoarthritis) of knee 12/20/2011  . Methotrexate, long term, current use 09/10/2011  . Knee pain, left 07/27/2011  . Pacemaker-[employer]tr[employer] 09/08/2010  . UNSPECIFIED VISUAL DISTURBANCE 07/20/2010  . SYNCOPE 06/01/2010  . IRON DEFIC ANEMIA SEC DIET IRON INTAKE 04/20/2010  . PSORIASIS 04/20/2010  . HEADACHE, PERSISTENT 01/13/2010  . HEMATURIA UNSPECIFIED 11/19/2009  . PHLEBITIS AND THROMBOPHLEBITIS OF OTHER SITE 07/02/2009  . CELLULITIS, LEG, LEFT 07/02/2009  . DISUSE OSTEOPOROSIS 05/12/2009  . HYPERGLYCEMIA, BORDERLINE 01/10/2009  . SPONDYLOSIS, LUMBAR, WITH RADICULOPATHY 10/15/2008  . OTHER SPECIFIED DERMATOMYCOSES 06/24/2008  . EXTRINSIC ASTHMA, WITH EXACERBATION 05/01/2008  . CALCU GALLBLADD W/OTH CHOLECYSTITIS&OBSTRUCTION 02/20/2008  . VARICOSE VEINS LOWER EXTREMITIES W/INFLAMMATION 12/25/2007  . EDEMA 12/25/2007  . INSOMNIA WITH SLEEP APNEA UNSPECIFIED 08/31/2007  . GERD 03/23/2007  . FIBRILLATION, ATRIAL 01/31/2007  . PSORIATIC ARTHRITIS 01/31/2007  . ABDOMINAL BLOATING 01/31/2007  . HYPERLIPIDEMIA 01/19/2007  . HYPERTENSION 01/19/2007  . PROSTATE CANCER, HX OF 01/19/2007    Past Medical History:  Past Medical History  Diagnosis Date  . Hyperlipidemia   . HTN (hypertension)   . Status post placement of cardiac pacemaker DDD-  06-04-10-- LAST CHECK 09-08-10 IN EPIC    SECONDARY TO SYNCOPY AND  BRADYCARDIA  . Restless leg syndrome   . Normal nuclear stress test 2008    LOW RISK--   DR  WALL  . Carotid stenosis, bilateral PER DR EARLY / DUPLEX  09-09-10      40 - 59%   BILATERALLY--- ASYMPTOMATIC  . History of echocardiogram 11-21-2006    EF 65%  . History of prostate cancer S/P RADIATION  TX  6 YRS AGO  . Psoriasis   . Arthritis with psoriasis   . GERD (gastroesophageal reflux disease)     CONTROLLED W/ PROTONIX  . Borderline diabetes     DIET CONTROLLED - PT STATES HE IS NOT DIABETIC  . Lumbar spondylosis W/ RADICULOPATHY  . Iron deficiency   . Cataract immature LEFT EYE  . PAF (paroxysmal atrial fibrillation)   . SVT (supraventricular tachycardia)     S/P ABLATION   2008  . Coronary artery disease CARDIOLOGIST- DR Sharrell Ku    09-08-10 VISIT AND PACEMAKER CHECK  IN EPIC  . Cancer     HX OF PROSTATE CANCER--TX'D WITH RADIATION  . Sleep apnea TESTED YRS AGO-- NO CPAP RX GIVEN    PT STATES HE DOES NOT THINK HE STILL HAS SLEEP APNEA--DOES NOT USE CPAP   Past Surgical History:  Past Surgical History  Procedure Laterality Date  . Replacement total knee  1997    RIGHT  . Cardiac pacemaker placement  06-04-10    DDD/ AND REMOVAL LOOR RECORDER  . Loop recorder placement  01-30-10  . Lumbar laminectomy/ diskectomy/ fusion  05-19-10    L4 - 5  . Cholecystectomy  2009  . Cardiac electrophysiology study and ablation  2008    FOR SVT  . Prostate biopsy  2007  . Transurethral  resection of prostate  2006  . Inguinal hernia repair  2005    LEFT/   DONE WITH PENILE PROSTESIS SURG.  . Removal and placement penile prosthesis implant   2005    AND LEFT CORPOROPLASTY /    DONE INGUINAL REPAIR  . Penile prosthesis implant  1995  . Knee arthroscopy  06/02/2011    Procedure: ARTHROSCOPY KNEE;  Surgeon: Loanne Drilling;  Location: [facility];  Service: Orthopedics;  Laterality: Left;  LEFT KNEE ARTHROSCOPY WITH DEBRIDEMENT  . Total knee arthroplasty  12/20/2011     Procedure: TOTAL KNEE ARTHROPLASTY;  Surgeon: Loanne Drilling, MD;  Location: [facility];  Service: Orthopedics;  Laterality: Left;  . Back surgery      Assessment & Plan Clinical Impression: Christopher Baker is a 79 y.o. right-handed male with history of hypertension,paroxysmal atrial fibrillation with pacemaker. Patient on no anti-coagulation prior to admission secondary to inconsistent appointments and refused with anticoagulation.. Admitted 04/03/2013 with left-sided weakness and facial droop. Cranial CT scan showed right cortical and subcortical infarcts(right caudate head, right parietal lobe). Infarct felt to be embolic secondary to history of PAF. MRI not completed secondary to pacemaker. Carotid Doppler showed bilateral ICA stenosis 40-59%. Echocardiogram with ejection fraction 55% grade 1 diastolic dysfunction no wall motion abnormalities. CT angiogram head negative for posterior circulation without stenosis or major occlusion. Patient did not receive TPA. Neurology service followup recommendations of Xarelto for CVA prophylaxis. Patient is maintained on a regular diet. Patient maintained on chronic prednisone therapy for history of psoriatic arthritis as well as methotrexate .  Patient transferred to CIR on 04/06/2013 .    Patient currently requires min with basic self-care skills secondary to impaired timing and sequencing, abnormal tone and decreased coordination, decreased attention to left, left side neglect and decreased motor planning and decreased attention, decreased awareness, decreased problem solving, decreased safety awareness and decreased memory.  Prior to hospitalization, patient could complete BADL  with independent .  Patient will benefit from skilled intervention to increase independence with basic self-care skills prior to discharge home with care partner.  Anticipate patient will require 24 hour supervision and follow up home health.  OT - End of Session Activity Tolerance:  Tolerates 30+ min activity with multiple rests Endurance Deficit: Yes OT Assessment Rehab Potential: Good OT Patient demonstrates impairments in the following area(s): Balance;Cognition;Endurance;Motor;Perception;Safety;Vision OT Basic ADL's Functional Problem(s): Grooming;Bathing;Dressing;Toileting OT Advanced ADL's Functional Problem(s): Simple Meal Preparation;Laundry;Light Housekeeping OT Transfers Functional Problem(s): Toilet;Tub/Shower OT Additional Impairment(s): Fuctional Use of Upper Extremity OT Plan OT Intensity: Minimum of 1-2 x/day, 45 to 90 minutes OT Frequency: 5 out of 7 days OT Duration/Estimated Length of Stay: 5-7 days OT Treatment/Interventions: Balance/vestibular training;Community reintegration;Discharge planning;DME/adaptive equipment instruction;Functional mobility training;Neuromuscular re-education;Functional electrical stimulation;Patient/family education;Self Care/advanced ADL retraining;Splinting/orthotics;Therapeutic Activities;Therapeutic Exercise;UE/LE Strength taining/ROM;UE/LE Coordination activities;Visual/perceptual remediation/compensation OT Basic Self-Care Anticipated Outcome(s): supeervision OT Toileting Anticipated Outcome(s): mod I OT Bathroom Transfers Anticipated Outcome(s): supervision OT Recommendation Patient destination: Home Follow Up Recommendations: Outpatient OT Equipment Recommended: Tub/shower bench  OT Evaluation Precautions/Restrictions  Precautions Precautions: Fall;Other (comment) Precaution Comments: left field cut and inattention Restrictions Weight Bearing Restrictions: No    Vital Signs O2 Device: None (Room air) Pain Pain Assessment Pain Score: 2  Home Living/Prior Functioning Home Living Available Help at Discharge: Available 24 hours/day;Family Type of Home: House Home Access: Stairs to enter [employer] of Steps: 3  Entrance Stairs-Rails: Left;Right Home Layout: Two level;Other  (Comment) Alternate Level Stairs-Number of Steps: 3 steps to landing  then 10 steps with R rail to attic Alternate Level Stairs-Rails: Right  Lives With: Spouse IADL History Homemaking Responsibilities:  (made sandwich; helped with food prep; vacumn) Current License: Yes Mode of Transportation: Car 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 Wife and patient advised to have second person in charge start communicating on patients behalf at this time to help business run due to the need to have therapy. ADL   Vision/Perception  Vision - History Baseline Vision: Wears glasses all the time Patient Visual Report: Peripheral vision impairment Vision - Assessment Eye Alignment: Within Functional Limits Vision Assessment: Vision impaired - to be further tested in functional context Ocular Range of Motion: Restricted on the left;Impaired-to be further tested in functional context Tracking/Visual Pursuits: Decreased smoothness of eye movement to LEFT superior field;Left eye does not track medially;Impaired - to be further tested in functional context Convergence: Impaired (comment) Visual Fields: Left visual field deficit Perception Perception: Impaired Inattention/Neglect: Does not attend to left visual field Praxis Praxis: Impaired Praxis Impairment Details: Ideomotor  Cognition Overall Cognitive Status: Impaired/Different from baseline Arousal/Alertness: Awake/alert Orientation Level: Oriented X4 Attention: Sustained Memory: Impaired Memory Impairment: Decreased short term memory Decreased Short Term Memory: Verbal basic;Functional basic Awareness: Impaired Awareness Impairment:  Intellectual impairment Problem Solving: Impaired Problem Solving Impairment: Verbal complex Safety/Judgment: Impaired Sensation Sensation Light Touch: Impaired by gross assessment (LUE feels numb) Coordination Gross Motor Movements are Fluid and Coordinated: No Fine Motor Movements are Fluid and Coordinated: No Motor  Motor Motor: Hemiplegia Mobility  Bed Mobility Bed Mobility: Supine to Sit;Sitting - Scoot to [employer] of Bed;Sit to Supine Supine to Sit: 5: Supervision Transfers Sit to Stand: 4: Min assist Sit to Stand Details: Verbal cues for precautions/safety  Trunk/Postural Assessment  Cervical Assessment Cervical Assessment: Within Functional Limits Thoracic Assessment Thoracic Assessment: Within Functional Limits Lumbar Assessment Lumbar Assessment: Within Functional Limits Postural Control Postural Control: Deficits on evaluation Protective Responses: decreased reaction to left  Balance Static Sitting Balance Static Sitting - Balance Support: Feet supported Static Sitting - Level of Assistance: 5: Stand by assistance Static Standing Balance Static Standing - Balance Support: No upper extremity supported Static Standing - Level of Assistance: 5: Stand by assistance Dynamic Standing Balance Dynamic Standing - Balance Support: No upper extremity supported;During functional activity Dynamic Standing - Level of Assistance: 4: Min assist Dynamic Standing - Balance Activities: Reaching for objects;Reaching across midline Extremity/Trunk Assessment RUE Assessment RUE Assessment: Within Functional Limits LUE Assessment LUE Assessment: Exceptions to WFL LUE AROM (degrees) Overall AROM Left Upper Extremity: Deficits  FIM:  FIM - Grooming Grooming Steps: Wash, rinse, dry face;Wash, rinse, dry hands;Oral care, brush teeth, clean dentures;Brush, comb hair Grooming: 4: Patient completes 3 of 4 or 4 of 5 steps FIM - Bathing Bathing Steps Patient Completed: Chest;Left  Arm;Abdomen;Front perineal area;Buttocks;Right upper leg;Left upper leg Bathing: 3: Mod-Patient completes 5-7 89f 10 parts or 50-74% FIM - Upper Body Dressing/Undressing Upper body dressing/undressing steps patient completed: Pull shirt over trunk;Put head through opening of pull over shirt/dress;Thread/unthread right sleeve of pullover shirt/dresss Upper body dressing/undressing: 4: Min-Patient completed 75 plus % of tasks FIM - Lower Body Dressing/Undressing Lower body dressing/undressing steps patient completed: Thread/unthread right underwear leg;Thread/unthread left underwear leg;Thread/unthread right pants leg;Thread/unthread left pants leg;Don/Doff right shoe;Don/Doff left  shoe Lower body dressing/undressing: 3: Mod-Patient completed 50-74% of tasks FIM - Toileting Toileting steps completed by patient: Performs perineal hygiene;Adjust clothing prior to toileting;Adjust clothing after toileting Toileting Assistive Devices: Grab bar or rail for support Toileting: 4: Steadying assist FIM - Banker Devices: Bed rails;HOB elevated Bed/Chair Transfer: 5: Sit > Supine: Supervision (verbal cues/safety issues);4: Bed > Chair or W/C: Min A (steadying Pt. > 75%);4: Chair or W/C > Bed: Min A (steadying Pt. > 75%) FIM - Diplomatic Services operational officer Devices: Grab bars Toilet Transfers: 4-To toilet/BSC: Min A (steadying Pt. > 75%);4-From toilet/BSC: Min A (steadying Pt. > 75%) FIM - Secretary/administrator Devices: Shower chair;Grab bars Tub/shower Transfers: 4-Into Tub/Shower: Min A (steadying Pt. > 75%/lift 1 leg);4-Out of Tub/Shower: Min A (steadying Pt. > 75%/lift 1 leg)   Refer to Care Plan for Long Term Goals  Recommendations for other services: None  Discharge Criteria: Patient will be discharged from OT if patient refuses treatment 3 consecutive times without medical reason, if treatment goals not met, if there is a  change in medical status, if patient makes no progress towards goals or if patient is discharged from hospital.  The above assessment, treatment plan, treatment alternatives and goals were discussed and mutually agreed upon: by patient and by family  Humberto Seals 04/07/2013, 5:21 PM

## 2013-04-07 NOTE — Evaluation (Addendum)
Physical Therapy Assessment and Plan  Patient Details  Name: Christopher Baker MRN: [id number] Date of Birth: 1933/10/30  PT Diagnosis: Abnormality of gait, Coordination disorder and Difficulty walking Rehab Potential: Good ELOS: 5-7 days   Today's Date: 04/07/2013 Time: 1029-1127 Time Calculation (min): 58 min  Problem List:  Patient Active Problem List   Diagnosis Date Noted  . CVA (cerebral infarction) 04/03/2013  . Occlusion and stenosis of carotid artery without mention of cerebral infarction 11/21/2012  . OA (osteoarthritis) of knee 12/20/2011  . Methotrexate, long term, current use 09/10/2011  . Knee pain, left 07/27/2011  . Pacemaker-Medtronic 09/08/2010  . UNSPECIFIED VISUAL DISTURBANCE 07/20/2010  . SYNCOPE 06/01/2010  . IRON DEFIC ANEMIA SEC DIET IRON INTAKE 04/20/2010  . PSORIASIS 04/20/2010  . HEADACHE, PERSISTENT 01/13/2010  . HEMATURIA UNSPECIFIED 11/19/2009  . PHLEBITIS AND THROMBOPHLEBITIS OF OTHER SITE 07/02/2009  . CELLULITIS, LEG, LEFT 07/02/2009  . DISUSE OSTEOPOROSIS 05/12/2009  . HYPERGLYCEMIA, BORDERLINE 01/10/2009  . SPONDYLOSIS, LUMBAR, WITH RADICULOPATHY 10/15/2008  . OTHER SPECIFIED DERMATOMYCOSES 06/24/2008  . EXTRINSIC ASTHMA, WITH EXACERBATION 05/01/2008  . CALCU GALLBLADD W/OTH CHOLECYSTITIS&OBSTRUCTION 02/20/2008  . VARICOSE VEINS LOWER EXTREMITIES W/INFLAMMATION 12/25/2007  . EDEMA 12/25/2007  . INSOMNIA WITH SLEEP APNEA UNSPECIFIED 08/31/2007  . GERD 03/23/2007  . FIBRILLATION, ATRIAL 01/31/2007  . PSORIATIC ARTHRITIS 01/31/2007  . ABDOMINAL BLOATING 01/31/2007  . HYPERLIPIDEMIA 01/19/2007  . HYPERTENSION 01/19/2007  . PROSTATE CANCER, HX OF 01/19/2007    Past Medical History:  Past Medical History  Diagnosis Date  . Hyperlipidemia   . HTN (hypertension)   . Status post placement of cardiac pacemaker DDD-  06-04-10-- LAST CHECK 09-08-10 IN EPIC    SECONDARY TO SYNCOPY AND BRADYCARDIA  . Restless leg syndrome   . Normal nuclear  stress test 2008    LOW RISK--   DR  WALL  . Carotid stenosis, bilateral PER DR EARLY / DUPLEX  09-09-10      40 - 59%   BILATERALLY--- ASYMPTOMATIC  . History of echocardiogram 11-21-2006    EF 65%  . History of prostate cancer S/P RADIATION  [place]  6 YRS AGO  . Psoriasis   . Arthritis with psoriasis   . GERD (gastroesophageal reflux disease)     CONTROLLED W/ PROTONIX  . Borderline diabetes     DIET CONTROLLED - PT STATES HE IS NOT DIABETIC  . Lumbar spondylosis W/ RADICULOPATHY  . Iron deficiency   . Cataract immature LEFT EYE  . PAF (paroxysmal atrial fibrillation)   . SVT (supraventricular tachycardia)     S/P ABLATION   2008  . Coronary artery disease CARDIOLOGIST- DR Sharrell Ku    09-08-10 VISIT AND PACEMAKER CHECK  IN EPIC  . Cancer     HX OF PROSTATE CANCER--TX'D WITH RADIATION  . Sleep apnea TESTED YRS AGO-- NO CPAP RX GIVEN    PT STATES HE DOES NOT THINK HE STILL HAS SLEEP APNEA--DOES NOT USE CPAP   Past Surgical History:  Past Surgical History  Procedure Laterality Date  . Replacement total knee  1997    RIGHT  . Cardiac pacemaker placement  06-04-10    DDD/ AND REMOVAL LOOR RECORDER  . Loop recorder placement  01-30-10  . Lumbar laminectomy/ diskectomy/ fusion  05-19-10    L4 - 5  . Cholecystectomy  2009  . Cardiac electrophysiology study and ablation  2008    FOR SVT  . Prostate biopsy  2007  . Transurethral resection of prostate  2006  . Inguinal  hernia repair  2005    LEFT/   DONE WITH PENILE PROSTESIS SURG.  . Removal and placement penile prosthesis implant   2005    AND LEFT CORPOROPLASTY /    DONE INGUINAL REPAIR  . Penile prosthesis implant  1995  . Knee arthroscopy  06/02/2011    Procedure: ARTHROSCOPY KNEE;  Surgeon: Loanne Drilling;  Location: [facility];  Service: Orthopedics;  Laterality: Left;  LEFT KNEE ARTHROSCOPY WITH DEBRIDEMENT  . Total knee arthroplasty  12/20/2011    Procedure: TOTAL KNEE ARTHROPLASTY;  Surgeon: Loanne Drilling, MD;  Location: [facility];  Service: Orthopedics;  Laterality: Left;  . Back surgery      Assessment & Plan Clinical Impression: Patient is a 79 y.o. right-handed male with history of hypertension,paroxysmal atrial fibrillation with pacemaker. Patient on no anti-coagulation prior to admission secondary to inconsistent appointments and refused with anticoagulation.. Admitted 04/03/2013 with left-sided weakness and facial droop. Cranial CT scan showed right cortical and subcortical infarcts(right caudate head, right parietal lobe). Infarct felt to be embolic secondary to history of PAF. MRI not completed secondary to pacemaker. Carotid Doppler showed bilateral ICA stenosis 40-59%. Echocardiogram with ejection fraction 55% grade 1 diastolic dysfunction no wall motion abnormalities. CT angiogram head negative for posterior circulation without stenosis or major occlusion. Patient did not receive TPA. Neurology service followup recommendations of Xarelto for CVA prophylaxis. Patient is maintained on a regular diet. Patient maintained on chronic prednisone therapy for history of psoriatic arthritis as well as methotrexate.  Patient transferred to CIR on 04/06/2013 .   Patient currently requires min with mobility secondary to muscle weakness, decreased cardiorespiratoy endurance and decreased coordination.  Prior to hospitalization, patient was independent  with mobility and lived with Spouse in a House home.  Home access is 3 Stairs to enter.  Patient will benefit from skilled PT intervention to maximize safe functional mobility, minimize fall risk and decrease caregiver burden for planned discharge home with 24 hour supervision.  Anticipate patient will benefit from follow up OP at discharge.  PT - End of Session Activity Tolerance: Tolerates 30+ min activity with multiple rests Endurance Deficit: Yes PT Assessment Rehab Potential: Good Barriers to Discharge: Inaccessible home environment PT  Patient demonstrates impairments in the following area(s): Balance;Endurance;Motor;Perception;Safety PT Transfers Functional Problem(s): Bed Mobility;Bed to Chair;Car PT Locomotion Functional Problem(s): Ambulation;Stairs PT Plan PT Intensity: Minimum of 1-2 x/day ,45 to 90 minutes PT Frequency: 5 out of 7 days PT Duration Estimated Length of Stay: 5-7 days PT Treatment/Interventions: Warden/ranger;Ambulation/gait training;Discharge planning;DME/adaptive equipment instruction;Functional mobility training;Patient/family education;Neuromuscular re-education;Stair training;Therapeutic Exercise;UE/LE Coordination activities;UE/LE Strength taining/ROM PT Transfers Anticipated Outcome(s): I for transfers PT Locomotion Anticipated Outcome(s): I for ambulation, S for stairs PT Recommendation Follow Up Recommendations: Outpatient PT Patient destination: Home  Skilled Therapeutic Intervention PT evaluation completed and treatment plan initiated. Treatment focused on high level ambulation and balance activities. Pt ambulated on slalom course about 80 feet with min guard to min A and verbal cues. Performed cone taps to work on LE strengthening and balance. Pt requires occasional verbal cues to L inattention.   PT Evaluation Precautions/Restrictions Precautions Precautions: Fall;Other (comment) Precaution Comments: Left inattention  Restrictions Weight Bearing Restrictions: No General   Vital Signs  Pain Pain Assessment Pain Assessment: No/denies pain Home Living/Prior Functioning Home Living Available Help at Discharge: Available 24 hours/day;Family Type of Home: House Home Access: Stairs to enter Entrance Stairs-Number of Steps: 3  Entrance Stairs-Rails: Left;Right (utilizes R rail) Home Layout: Two  level;Other (Comment) (able to live on main level, attic) Alternate Level Stairs-Number of Steps: 3 steps to landing then 10 steps with R rail to attic Alternate Level  Stairs-Rails: Right  Lives With: Spouse Prior Function Level of Independence: Independent with transfers;Independent with gait  Able to Take Stairs?: Reciprically Driving: Yes Vocation: Part time employment Vision/Perception  Vision - History Baseline Vision: Wears glasses all the time Patient Visual Report: Peripheral vision impairment Vision - Assessment Visual Fields: Left visual field deficit  Cognition Overall Cognitive Status: Impaired/Different from baseline Arousal/Alertness: Awake/alert Orientation Level: Oriented X4 Awareness: Impaired Problem Solving: Impaired (mild impairment) Sensation Sensation Light Touch: Impaired by gross assessment Additional Comments: impaired B feet L worse than R Coordination Fine Motor Movements are Fluid and Coordinated: No Motor     Mobility Bed Mobility Supine to Sit: 5: Supervision Sit to Supine: 5: Supervision Transfers Transfers: Yes Sit to Stand: 4: Min guard Stand to Sit: 4: Min guard Stand Pivot Transfers: 4: Min Scientist, research (physical sciences) Ambulation/Gait Assistance: 4: Min Government social research officer (Feet): 150 Feet Ambulation/Gait Assistance Details: Verbal cues for precautions/safety Gait Gait Pattern: Impaired Stairs / Additional Locomotion Stairs: Yes Stair Management Technique: One rail Right Number of Stairs: 11 Wheelchair Mobility Wheelchair Mobility: No  Trunk/Postural Assessment  Cervical Assessment Cervical Assessment: Within Functional Limits Thoracic Assessment Thoracic Assessment: Within Functional Limits Lumbar Assessment Lumbar Assessment: Within Functional Limits Postural Control Postural Control: Deficits on evaluation  Balance Balance Balance Assessed: Yes Static Sitting Balance Static Sitting - Balance Support: Feet supported Static Sitting - Level of Assistance: 5: Stand by assistance Static Standing Balance Static Standing - Balance Support: No upper extremity supported Static  Standing - Level of Assistance: 5: Stand by assistance Dynamic Standing Balance Dynamic Standing - Balance Support: No upper extremity supported;During functional activity Dynamic Standing - Level of Assistance: 4: Min assist Extremity Assessment  B UEs refer to OT evaluation   RLE Assessment RLE Assessment: Exceptions to [facility] RLE AROM (degrees) Overall AROM Right Lower Extremity: Deficits RLE Overall AROM Comments: R knee ROM 10-85 degrees RLE Strength RLE Overall Strength: Within Functional Limits for tasks assessed RLE Overall Strength Comments: grossly 4/5 LLE Assessment LLE Assessment: Exceptions to WFL LLE AROM (degrees) Overall AROM Left Lower Extremity: Within functional limits for tasks assessed LLE Strength LLE Overall Strength: Deficits LLE Overall Strength Comments: grossly 4-/5 except ankle 3/5  FIM:  FIM - Bed/Chair Transfer Bed/Chair Transfer Assistive Devices: Bed rails;HOB elevated Bed/Chair Transfer: 5: Supine > Sit: Supervision (verbal cues/safety issues);5: Sit > Supine: Supervision (verbal cues/safety issues);4: Chair or W/C > Bed: Min A (steadying Pt. > 75%);4: Bed > Chair or W/C: Min A (steadying Pt. > 75%) FIM - Locomotion: Ambulation Locomotion: Ambulation Assistive Devices: Other (comment) (without assistive device) Ambulation/Gait Assistance: 4: Min guard Locomotion: Ambulation: 4: Travels 150 ft or more with minimal assistance (Pt.>75%) FIM - Locomotion: Stairs Locomotion: Building control surveyor: Hand rail - 1 Locomotion: Stairs: 2: Up and Down 4 - 11 stairs with minimal assistance (Pt.>75%)   Refer to Care Plan for Long Term Goals  Recommendations for other services: None  Discharge Criteria: Patient will be discharged from PT if patient refuses treatment 3 consecutive times without medical reason, if treatment goals not met, if there is a change in medical status, if patient makes no progress towards goals or if patient is discharged from  hospital.  The above assessment, treatment plan, treatment alternatives and goals were discussed and mutually agreed upon: by patient and by  family  Rayford Halsted 04/07/2013, 11:54 AM   Pt complains of pain bilateral 1st MTP joints with weight bearing activities, L worse than R, rated at 3/10.

## 2013-04-07 NOTE — Evaluation (Addendum)
Speech Language Pathology Assessment and Plan  Patient Details  Name: Christopher Baker MRN: [id number] Date of Birth: 1933-12-25  SLP Diagnosis: Cognitive Impairments  Rehab Potential: Excellent ELOS: 7-10 days   Today's Date: 04/07/2013 Time: 0905-1005 Time Calculation (min): 60 min  Problem List:  Patient Active Problem List   Diagnosis Date Noted  . CVA (cerebral infarction) 04/03/2013  . Occlusion and stenosis of carotid artery without mention of cerebral infarction 11/21/2012  . OA (osteoarthritis) of knee 12/20/2011  . Methotrexate, long term, current use 09/10/2011  . Knee pain, left 07/27/2011  . Pacemaker-Medtronic 09/08/2010  . UNSPECIFIED VISUAL DISTURBANCE 07/20/2010  . SYNCOPE 06/01/2010  . IRON DEFIC ANEMIA SEC DIET IRON INTAKE 04/20/2010  . PSORIASIS 04/20/2010  . HEADACHE, PERSISTENT 01/13/2010  . HEMATURIA UNSPECIFIED 11/19/2009  . PHLEBITIS AND THROMBOPHLEBITIS OF OTHER SITE 07/02/2009  . CELLULITIS, LEG, LEFT 07/02/2009  . DISUSE OSTEOPOROSIS 05/12/2009  . HYPERGLYCEMIA, BORDERLINE 01/10/2009  . SPONDYLOSIS, LUMBAR, WITH RADICULOPATHY 10/15/2008  . OTHER SPECIFIED DERMATOMYCOSES 06/24/2008  . EXTRINSIC ASTHMA, WITH EXACERBATION 05/01/2008  . CALCU GALLBLADD W/OTH CHOLECYSTITIS&OBSTRUCTION 02/20/2008  . VARICOSE VEINS LOWER EXTREMITIES W/INFLAMMATION 12/25/2007  . EDEMA 12/25/2007  . INSOMNIA WITH SLEEP APNEA UNSPECIFIED 08/31/2007  . GERD 03/23/2007  . FIBRILLATION, ATRIAL 01/31/2007  . PSORIATIC ARTHRITIS 01/31/2007  . ABDOMINAL BLOATING 01/31/2007  . HYPERLIPIDEMIA 01/19/2007  . HYPERTENSION 01/19/2007  . PROSTATE CANCER, HX OF 01/19/2007   Past Medical History:  Past Medical History  Diagnosis Date  . Hyperlipidemia   . HTN (hypertension)   . Status post placement of cardiac pacemaker DDD-  06-04-10-- LAST CHECK 09-08-10 IN EPIC    SECONDARY TO SYNCOPY AND BRADYCARDIA  . Restless leg syndrome   . Normal nuclear stress test 2008    LOW RISK--    DR  WALL  . Carotid stenosis, bilateral PER DR EARLY / DUPLEX  09-09-10      40 - 59%   BILATERALLY--- ASYMPTOMATIC  . History of echocardiogram 11-21-2006    EF 65%  . History of prostate cancer S/P RADIATION  [place]  6 YRS AGO  . Psoriasis   . Arthritis with psoriasis   . GERD (gastroesophageal reflux disease)     CONTROLLED W/ PROTONIX  . Borderline diabetes     DIET CONTROLLED - PT STATES HE IS NOT DIABETIC  . Lumbar spondylosis W/ RADICULOPATHY  . Iron deficiency   . Cataract immature LEFT EYE  . PAF (paroxysmal atrial fibrillation)   . SVT (supraventricular tachycardia)     S/P ABLATION   2008  . Coronary artery disease CARDIOLOGIST- DR Sharrell Ku    09-08-10 VISIT AND PACEMAKER CHECK  IN EPIC  . Cancer     HX OF PROSTATE CANCER--TX'D WITH RADIATION  . Sleep apnea TESTED YRS AGO-- NO CPAP RX GIVEN    PT STATES HE DOES NOT THINK HE STILL HAS SLEEP APNEA--DOES NOT USE CPAP   Past Surgical History:  Past Surgical History  Procedure Laterality Date  . Replacement total knee  1997    RIGHT  . Cardiac pacemaker placement  06-04-10    DDD/ AND REMOVAL LOOR RECORDER  . Loop recorder placement  01-30-10  . Lumbar laminectomy/ diskectomy/ fusion  05-19-10    L4 - 5  . Cholecystectomy  2009  . Cardiac electrophysiology study and ablation  2008    FOR SVT  . Prostate biopsy  2007  . Transurethral resection of prostate  2006  . Inguinal hernia repair  2005  LEFT/   DONE WITH PENILE PROSTESIS SURG.  . Removal and placement penile prosthesis implant   2005    AND LEFT CORPOROPLASTY /    DONE INGUINAL REPAIR  . Penile prosthesis implant  1995  . Knee arthroscopy  06/02/2011    Procedure: ARTHROSCOPY KNEE;  Surgeon: Loanne Drilling;  Location: [facility];  Service: Orthopedics;  Laterality: Left;  LEFT KNEE ARTHROSCOPY WITH DEBRIDEMENT  . Total knee arthroplasty  12/20/2011    Procedure: TOTAL KNEE ARTHROPLASTY;  Surgeon: Loanne Drilling, MD;  Location: [facility];   Service: Orthopedics;  Laterality: Left;  . Back surgery      Assessment / Plan / Recommendation Clinical Impression  The patient is a 79 year old right-handed male with history of hypertension, paroxysmal  atrial fibrillation with pacemaker.  He was admitted 10/14/201,4 with left-sided weakness and facial droop. Cranial CT scan showed right cortical and subcortical infarcts(right caudate head, right parietal lobe). Infarct felt to be embolic secondary to history of PAF. Patient did not receive TPA. Patient has maintained a regular diet. Patient was felt to be a good candidate for inpatient rehabilitation services and was admitted for comprehensive rehabilitation program.  Speech Evaluation completed and revealed complex problem solving deficit, specifically with abstract thinking, conceptualization, along with mild short term memory deficit.  The patient's wife was present during evaluation and felt that short term memory showed improvement, as evidenced during evaluation.  She stated that patient had shown some mild cognitive impairment pre-CVA and had been diagnosed with B-12 deficiency, for which he is receiving shots.  She stated a noted improvement in cognition after receiving B-12 shots.  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     SLP Assessment  Patient will need skilled Speech Lanaguage Pathology Services during CIR admission    Recommendations  Diet Recommendations: Regular;Thin liquid Liquid Administration via: Straw;Cup Supervision: Patient able to self feed Compensations: Small sips/bites Postural Changes and/or Swallow Maneuvers: Seated upright 90 degrees Oral Care Recommendations: Oral care BID Recommendations for Other Services: Neuropsych consult Patient destination: Home Follow up  Recommendations: Home Health SLP Equipment Recommended: None recommended by SLP    SLP Frequency 5 out of 7 days   SLP Treatment/Interventions Cognitive remediation/compensation;Therapeutic Activities;Patient/family education;Medication managment;Environmental controls    Pain Pain Assessment Pain Assessment: No/denies pain Prior Functioning Type of Home: House  Lives With: Spouse Available Help at Discharge: Available 24 hours/day;Family Vocation: Part time employment  Short Term Goals: Week 1: SLP Short Term Goal 1 (Week 1): Patient will demonstrate complex problem solving during functional activities with min A multimodal cueing, in order to return to prior level of independence.  SLP Short Term Goal 2 (Week 1): Patient will attend to left field of environment during functional tasks with min A multimodal cueing, in order to return to prior level of independence.  SLP Short Term Goal 3 (Week 1): Patient will participate in short term memory activities with min A multimodal cueing, in order to return to prior level of independence.   See FIM for current functional status Refer to Care Plan for Long Term Goals  Recommendations for other services: Neuropsych  Discharge Criteria: Patient will be discharged from SLP if patient refuses treatment 3 consecutive times without medical reason, if treatment goals not met, if there is a change in  medical status, if patient makes no progress towards goals or if patient is discharged from hospital.  The above assessment, treatment plan, treatment alternatives and goals were discussed and mutually agreed upon: by patient  Lenny Pastel 04/07/2013, 1:01 PM

## 2013-04-08 ENCOUNTER — Inpatient Hospital Stay (HOSPITAL_COMMUNITY): Payer: Medicare Other | Admitting: *Deleted

## 2013-04-08 MED ORDER — SOLIFENACIN SUCCINATE 5 MG PO TABS
10.0000 mg | ORAL_TABLET | ORAL | Status: DC
  Administered 2013-04-08 – 2013-04-13 (×6): 10 mg via ORAL
  Filled 2013-04-08 (×5): qty 2

## 2013-04-08 MED ORDER — PREDNISONE 10 MG PO TABS
10.0000 mg | ORAL_TABLET | Freq: Every day | ORAL | Status: DC
  Administered 2013-04-11 – 2013-04-14 (×4): 10 mg via ORAL
  Filled 2013-04-08 (×5): qty 1

## 2013-04-08 MED ORDER — SOLIFENACIN SUCCINATE 5 MG PO TABS
10.0000 mg | ORAL_TABLET | Freq: Every day | ORAL | Status: DC
  Filled 2013-04-08: qty 2

## 2013-04-08 MED ORDER — PREDNISONE 10 MG PO TABS
15.0000 mg | ORAL_TABLET | ORAL | Status: AC
  Administered 2013-04-08: 15 mg via ORAL
  Filled 2013-04-08: qty 1

## 2013-04-08 MED ORDER — MIRABEGRON ER 50 MG PO TB24
50.0000 mg | ORAL_TABLET | ORAL | Status: DC
  Administered 2013-04-08 – 2013-04-10 (×3): 50 mg via ORAL
  Filled 2013-04-08 (×4): qty 1

## 2013-04-08 MED ORDER — LISINOPRIL 10 MG PO TABS
10.0000 mg | ORAL_TABLET | Freq: Every day | ORAL | Status: DC
  Administered 2013-04-08 – 2013-04-12 (×5): 10 mg via ORAL
  Filled 2013-04-08 (×6): qty 1

## 2013-04-08 MED ORDER — PREDNISONE 20 MG PO TABS
20.0000 mg | ORAL_TABLET | Freq: Every day | ORAL | Status: DC
  Filled 2013-04-08 (×2): qty 1

## 2013-04-08 MED ORDER — PREDNISONE 20 MG PO TABS
20.0000 mg | ORAL_TABLET | Freq: Every day | ORAL | Status: AC
  Administered 2013-04-09 – 2013-04-10 (×2): 20 mg via ORAL
  Filled 2013-04-08 (×2): qty 1

## 2013-04-08 NOTE — Progress Notes (Signed)
HPI: Christopher Baker is a 79 y.o. right-handed male with history of hypertension,paroxysmal atrial fibrillation with pacemaker. Patient on no anti-coagulation prior to admission secondary to inconsistent appointments and refused with anticoagulation.. Admitted 04/03/2013 with left-sided weakness and facial droop. Cranial CT scan showed right cortical and subcortical infarcts(right caudate head, right parietal lobe). Infarct felt to be embolic secondary to history of PAF. MRI not completed secondary to pacemaker. Carotid Doppler showed bilateral ICA stenosis 40-59%. Echocardiogram with ejection fraction 55% grade 1 diastolic dysfunction no wall motion abnormalities. CT angiogram head negative for posterior circulation without stenosis or major occlusion. Patient did not receive TPA. Neurology service followup recommendations of Xarelto for CVA prophylaxis. Patient is maintained on a regular diet. Patient maintained on chronic prednisone therapy for history of psoriatic arthritis as well as methotrexate. Physical and occupational therapy evaluations completed an ongoing with recommendations for physical medicine rehabilitation consult to consider inpatient rehabilitation services. Patient was felt to be a good candidate for inpatient rehabilitation services and was admitted for comprehensive rehabilitation program  Subjective: Patient complains of left great toe pain. He states it started during therapy yesterday and worsened overnight.  obj: BP 159/74  Pulse 75  Temp(Src) 98.5 F (36.9 C) (Oral)  Resp 20  Ht 6\' 3"  (1.905 m)  Wt 227 lb 4.7 oz (103.1 kg)  BMI 28.41 kg/m2  SpO2 95%  Pleasant male in no acute distress. Chest clear to auscultation. Cardiac exam S1-S2 are regular. Abdominal exam overweight, and bowel sounds. Extremities no edema. Musculoskeletal swelling at the left first MTP joint with erythema and warmth.   A/P Medical Problem List and Plan:  1. Embolic right parietal and caudate  infarct  2. DVT Prophylaxis/Anticoagulation: Continues Xarelto  3. Pain Management: Tylenol as needed  4. Mood: Celexa 20 mg daily. Provide emotional support  5. Neuropsych: This patient is capable of making decisions on his own behalf.  6. Paroxysmal atrial fibrillation/pacemaker. Continue Xarelto. Cardiac rate control.  7. Hypertension. Norvasc 10 mg daily. Blood pressure 146-159/74-77. Will add low-dose ACE inhibitor. 8. Hyperlipidemia. Continue Lipitor, Zetia  9. Psoriatic arthritis. Continue prednisone. Discuss resuming weekly methotrexate  10. BPH/TURP. Patient will use his home dose of VESIcare. We'll continue Myrbetriq 11. Adhesive capsulitis right shoulder. Probably best for outpatient evaluation. 12. Gout- reviewed medication list. This is a new problem for him. He's been on prednisone for psoriatic arthritis for years. I'll increase prednisone to 20 mg daily for 3 days and then resume 5 mg.

## 2013-04-08 NOTE — Progress Notes (Signed)
Occupational Therapy Note Patient Details  Name: HENDRICK PAVICH MRN: [id number] Date of Birth: 1933-11-18 Today's Date: 04/08/2013  Time:  1330-1430  (60 min) Pain:  Both toes 3/10 Individual session  Addressed bathing and dressing shower level, transfers, safety, sit to stand, standing balance.  Pt. Was minimal assist with ambulation to shower.  Wife present.  Pt exhibited decreased attention to left when walking to shower and needed cues to attend.  LUE exhibited poor grasp of soap, washcloth with multiple drops.  Wife asking if shower stall or bathtub with HH shower would be better at home.  Advised on using tub with HH shower with tub transfer bench.  Shower stall has door and no HH shower hose.  Address further with ongoing treatments.  Wife returned demonstration safely for walking pt to bathroom with close supervision.  Checked her off.  Left pt in room with wife.  Pt. Still exhibits decreased attention to left and safety awareness, decreased short term memory.  Asked wife all night about shower and was it time.  Also gets up without calling.      Humberto Seals 04/08/2013, 6:41 PM

## 2013-04-09 ENCOUNTER — Inpatient Hospital Stay (HOSPITAL_COMMUNITY): Payer: Medicare Other

## 2013-04-09 ENCOUNTER — Inpatient Hospital Stay (HOSPITAL_COMMUNITY): Payer: Medicare Other | Admitting: Occupational Therapy

## 2013-04-09 DIAGNOSIS — I6340 Cerebral infarction due to embolism of unspecified cerebral artery: Secondary | ICD-10-CM

## 2013-04-09 DIAGNOSIS — F0150 Vascular dementia without behavioral disturbance: Secondary | ICD-10-CM

## 2013-04-09 DIAGNOSIS — G8110 Spastic hemiplegia affecting unspecified side: Secondary | ICD-10-CM

## 2013-04-09 LAB — COMPREHENSIVE METABOLIC PANEL
Alkaline Phosphatase: 85 U/L (ref 39–117)
BUN: 18 mg/dL (ref 6–23)
CO2: 27 mEq/L (ref 19–32)
Chloride: 105 mEq/L (ref 96–112)
Creatinine, Ser: 1.08 mg/dL (ref 0.50–1.35)
GFR calc Af Amer: 74 mL/min — ABNORMAL LOW (ref >90)
GFR calc non Af Amer: 64 mL/min — ABNORMAL LOW (ref >90)
Glucose, Bld: 95 mg/dL (ref 70–99)
Potassium: 3.7 mEq/L (ref 3.5–5.1)
Total Bilirubin: 0.4 mg/dL (ref 0.3–1.2)

## 2013-04-09 LAB — CBC WITH DIFFERENTIAL/PLATELET
Basophils Absolute: 0 10*3/uL (ref 0.0–0.1)
HCT: 41.3 % (ref 39.0–52.0)
Hemoglobin: 13.9 g/dL (ref 13.0–17.0)
Lymphs Abs: 1.1 10*3/uL (ref 0.7–4.0)
MCHC: 33.7 g/dL (ref 30.0–36.0)
MCV: 94.5 fL (ref 78.0–100.0)
Monocytes Absolute: 1.3 10*3/uL — ABNORMAL HIGH (ref 0.1–1.0)
Monocytes Relative: 14 % — ABNORMAL HIGH (ref 3–12)
Neutro Abs: 6.9 10*3/uL (ref 1.7–7.7)
Neutrophils Relative %: 73 % (ref 43–77)
Platelets: 190 10*3/uL (ref 150–400)
RBC: 4.37 MIL/uL (ref 4.22–5.81)

## 2013-04-09 NOTE — Progress Notes (Signed)
Physical Therapy Note  Patient Details  Name: Christopher Baker MRN: [id number] Date of Birth: [birth year] Today's Date: 04/09/2013  No pain reported AM or BM. 0905-0950 45 min 1330-1415 45 min Individual therapy  Treatment 1:  focused on neuromuscular re-education via forced use, demo, VCs for trunk shortenin/lengthening/rotating in sitting unsupported> unsupported, gait to/from room x 100' with min guard assist, step/taps onto 2" high stool, with LOB requiring min assist to regain, bil knee adduction in sitting with arms crossed across chest, bil shoulder adduction in sitting.  Treatment 2:  Berg balance test.  Patient demonstrates increased fall risk as noted by score of  37 /56 on Berg Balance Scale.  (<36= high risk for falls, close to 100%; 37-45 significant >80%; 46-51 moderate >50%; 52-55 lower >25%).  Gait as above on level tile, and up/down 5 steps with 2 rails, curb and ramp without AD min assist.  Pt alternated feet when descending, but had poor eccentric control L knee.  Transfers recliner> stand and stand> bed with min assist.  [name] 04/09/2013, 9:08 AM

## 2013-04-09 NOTE — IPOC Note (Signed)
Overall Plan of Care [facility]) Patient Details Name: Christopher Baker MRN: [id number] DOB: [birth year]  Admitting Diagnosis: R CVA  Hospital Problems: Principal Problem:   Embolic cerebral infarction Right MCA infarct Left Neglect  Left hemiparesis    Functional Problem List: Nursing    PT Balance;Endurance;Motor;Perception;Safety  OT Balance;Cognition;Endurance;Motor;Perception;Safety;Vision  SLP Cognition;Safety  TR         Basic ADL's: OT Grooming;Bathing;Dressing;Toileting     Advanced  ADL's: OT Simple Meal Preparation;Laundry;Light Housekeeping     Transfers: PT Bed Mobility;Bed to Chair;Car  OT Toilet;Tub/Shower     Locomotion: PT Ambulation;Stairs     Additional Impairments: OT Fuctional Use of Upper Extremity  SLP        TR      Anticipated Outcomes Item Anticipated Outcome  Self Feeding    Swallowing      Basic self-care  supeervision  Toileting  mod I   Bathroom Transfers supervision  Bowel/Bladder     Transfers  I for transfers  Locomotion  I for ambulation, S for stairs  Communication     Cognition     Pain     Safety/Judgment      Therapy Plan: PT Intensity: Minimum of 1-2 x/day ,45 to 90 minutes PT Frequency: 5 out of 7 days PT Duration Estimated Length of Stay: 5-7 days OT Intensity: Minimum of 1-2 x/day, 45 to 90 minutes OT Frequency: 5 out of 7 days OT Duration/Estimated Length of Stay: 5-7 days SLP Frequency: 5 out of 7 days SLP Duration/Estimated Length of Stay: 7-10 days       Team Interventions: Nursing Interventions Patient/Family Education  PT interventions Balance/vestibular training;Ambulation/gait training;Discharge planning;DME/adaptive equipment instruction;Functional mobility training;Patient/family education;Neuromuscular re-education;Stair training;Therapeutic Exercise;UE/LE Coordination activities;UE/LE Strength taining/ROM  OT Interventions Balance/vestibular training;Community reintegration;Discharge  planning;DME/adaptive equipment instruction;Functional mobility training;Neuromuscular re-education;Functional electrical stimulation;Patient/family education;Self Care/advanced ADL retraining;Splinting/orthotics;Therapeutic Activities;Therapeutic Exercise;UE/LE Strength taining/ROM;UE/LE Coordination activities;Visual/perceptual remediation/compensation  SLP Interventions Cognitive remediation/compensation;Therapeutic Activities;Patient/family education;Medication managment;Environmental controls  TR Interventions    SW/CM Interventions      Team Discharge Planning: Destination: PT-Home ,OT- Home , SLP-Home Projected Follow-up: PT-Outpatient PT, OT-  Outpatient OT, SLP-Home Health SLP Projected Equipment Needs: PT- , OT- Tub/shower bench, SLP-None recommended by SLP Patient/family involved in discharge planning: PT- Patient;Family member/caregiver,  OT-Patient;Family member/caregiver, SLP-Patient;Family member/caregiver  MD ELOS: 2 wks Medical Rehab Prognosis:  Good Assessment: 79 yo male admitted for sudden onset Left hemiparesis, w/u revealed R MCA infarct.  Also has L neglect and cognitive deficits.   Now requiring 24/7 Rehab RN,MD, as well as CIR level PT, OT and SLP.  Treatment team will focus on ADLs and mobility with goals set at    See Team Conference Notes for weekly updates to the plan of care

## 2013-04-09 NOTE — Progress Notes (Signed)
Occupational Therapy Session Note  Patient Details  Name: Christopher Baker MRN: [id number] Date of Birth: 1934-01-25  Today's Date: 04/09/2013 Time: 0806-0905 Time Calculation (min): 59 min  Skilled Therapeutic Interventions/Progress Updates:      Pt seen for BADL retraining of toileting, bathing, and dressing with a focus on dynamic standing balance, use of LUE and attention to left side.  Pt ambulated around room with close supervision with occasional cues to stand with a wider base of support. Pt was able to use left hand to hold his toothbrush and grasp washcloth. He needed assist with dressing as his lateral pinch is extremely weak.  Pt provided with wood clothespin to work on that movement.  He is improving with overall LUE strength, but continues to have difficulty extending his fingers.  Pt's PT had arrived for his next session.  Therapy Documentation Precautions:  Precautions Precautions: Fall;Other (comment) Precaution Comments: left field cut and inattention Restrictions Weight Bearing Restrictions: No   Pain: Pain Assessment Pain Assessment: No/denies pain ADL:  See FIM for current functional status  Therapy/Group: Individual Therapy  SAGUIER,JULIA 04/09/2013, 9:38 AM

## 2013-04-09 NOTE — Progress Notes (Signed)
HPI: Christopher Baker is a 79 y.o. right-handed male with history of hypertension,paroxysmal atrial fibrillation with pacemaker. Patient on no anti-coagulation prior to admission secondary to inconsistent appointments and refused with anticoagulation.. Admitted 04/03/2013 with left-sided weakness and facial droop. Cranial CT scan showed right cortical and subcortical infarcts(right caudate head, right parietal lobe). Infarct felt to be embolic secondary to history of PAF. MRI not completed secondary to pacemaker. Carotid Doppler showed bilateral ICA stenosis 40-59%. Echocardiogram with ejection fraction 55% grade 1 diastolic dysfunction no wall motion abnormalities. CT angiogram head negative for posterior circulation without stenosis or major occlusion. Patient did not receive TPA. Neurology service followup recommendations of Xarelto for CVA prophylaxis. Patient is maintained on a regular diet. Patient maintained on chronic prednisone therapy for history of psoriatic arthritis as well as methotrexate. Physical and occupational therapy evaluations completed an ongoing with recommendations for physical medicine rehabilitation consult to consider inpatient rehabilitation services. Patient was felt to be a good candidate for inpatient rehabilitation services and was admitted for comprehensive rehabilitation program  Subjective: Patient complains of left great toe pain. Overall improving Subjective/Complaints:   Objective: Vital Signs: Blood pressure 127/68, pulse 61, temperature 98.1 F (36.7 C), temperature source Oral, resp. rate 19, height 6\' 3"  (1.905 m), weight 103.1 kg (227 lb 4.7 oz), SpO2 96.00%. No results found. Results for orders placed during the hospital encounter of 04/06/13 (from the past 72 hour(s))  CBC WITH DIFFERENTIAL     Status: Abnormal   Collection Time    04/09/13  5:24 AM      Result Value Range   WBC 9.4  4.0 - 10.5 K/uL   RBC 4.37  4.22 - 5.81 MIL/uL   Hemoglobin 13.9  13.0  - 17.0 g/dL   HCT 82.9  56.2 - 13.0 %   MCV 94.5  78.0 - 100.0 fL   MCH 31.8  26.0 - 34.0 pg   MCHC 33.7  30.0 - 36.0 g/dL   RDW 86.5  78.4 - 69.6 %   Platelets 190  150 - 400 K/uL   Neutrophils Relative % 73  43 - 77 %   Neutro Abs 6.9  1.7 - 7.7 K/uL   Lymphocytes Relative 12  12 - 46 %   Lymphs Abs 1.1  0.7 - 4.0 K/uL   Monocytes Relative 14 (*) 3 - 12 %   Monocytes Absolute 1.3 (*) 0.1 - 1.0 K/uL   Eosinophils Relative 1  0 - 5 %   Eosinophils Absolute 0.1  0.0 - 0.7 K/uL   Basophils Relative 0  0 - 1 %   Basophils Absolute 0.0  0.0 - 0.1 K/uL  COMPREHENSIVE METABOLIC PANEL     Status: Abnormal   Collection Time    04/09/13  5:24 AM      Result Value Range   Sodium 140  135 - 145 mEq/L   Potassium 3.7  3.5 - 5.1 mEq/L   Chloride 105  96 - 112 mEq/L   CO2 27  19 - 32 mEq/L   Glucose, Bld 95  70 - 99 mg/dL   BUN 18  6 - 23 mg/dL   Creatinine, Ser 2.95  0.50 - 1.35 mg/dL   Calcium 9.1  8.4 - 28.4 mg/dL   Total Protein 5.9 (*) 6.0 - 8.3 g/dL   Albumin 3.0 (*) 3.5 - 5.2 g/dL   AST 20  0 - 37 U/L   ALT 32  0 - 53 U/L   Alkaline Phosphatase 85  39 - 117 U/L   Total Bilirubin 0.4  0.3 - 1.2 mg/dL   GFR calc non Af Amer 64 (*) >90 mL/min   GFR calc Af Amer 74 (*) >90 mL/min   Comment: (NOTE)     The eGFR has been calculated using the CKD EPI equation.     This calculation has not been validated in all clinical situations.     eGFR's persistently <90 mL/min signify possible Chronic Kidney     Disease.     HEENT: normal Cardio: RRR and no murmurs Resp: CTA B/L and unlabored GI: BS positive and non distended Extremity:  Pulses positive and Edema Left great toe  Skin:   Intact and Other erythema Left foot greatest at 1st MTP Neuro: Alert/Oriented, Abnormal Motor 2-/5 Left delt, bi, tri, grip, 3- Left HF, KE, ADF, Abnormal FMC Ataxic/ dec FMC and Tone  increased Left UE flexor tone, and LLE ext tone, Tone:  hyper reflexia, Inattention and Other Left field cut Musc/Skel:   Extremity tender Left 1st MTP Gen NAD   Assessment/Plan: 1. Functional deficits secondary to Right parietal, caudate infarct Left hemiparesis, left neglect , left field cut which require 3+ hours per day of interdisciplinary therapy in a comprehensive inpatient rehab setting. Physiatrist is providing close team supervision and 24 hour management of active medical problems listed below. Physiatrist and rehab team continue to assess barriers to discharge/monitor patient progress toward functional and medical goals. FIM: FIM - Bathing Bathing Steps Patient Completed: Chest;Left Arm;Abdomen;Front perineal area;Buttocks;Right upper leg;Left upper leg Bathing: 3: Mod-Patient completes 5-7 31f 10 parts or 50-74%  FIM - Upper Body Dressing/Undressing Upper body dressing/undressing steps patient completed: Pull shirt over trunk;Put head through opening of pull over shirt/dress;Thread/unthread right sleeve of pullover shirt/dresss Upper body dressing/undressing: 4: Min-Patient completed 75 plus % of tasks FIM - Lower Body Dressing/Undressing Lower body dressing/undressing steps patient completed: Thread/unthread right underwear leg;Thread/unthread left underwear leg;Thread/unthread right pants leg;Thread/unthread left pants leg;Don/Doff right shoe;Don/Doff left shoe Lower body dressing/undressing: 3: Mod-Patient completed 50-74% of tasks  FIM - Toileting Toileting steps completed by patient: Performs perineal hygiene Toileting Assistive Devices: Grab bar or rail for support Toileting: 5: Supervision: Safety issues/verbal cues  FIM - Diplomatic Services operational officer Devices: Grab bars Toilet Transfers: 5-From toilet/BSC: Supervision (verbal cues/safety issues);5-To toilet/BSC: Supervision (verbal cues/safety issues)  FIM - Press photographer Assistive Devices: Bed rails;HOB elevated Bed/Chair Transfer: 5: Bed > Chair or W/C: Supervision (verbal cues/safety  issues)  FIM - Locomotion: Ambulation Locomotion: Ambulation Assistive Devices: Other (comment) (without assistive device) Ambulation/Gait Assistance: 4: Min guard Locomotion: Ambulation: 4: Travels 150 ft or more with minimal assistance (Pt.>75%)  Comprehension Comprehension Mode: Auditory Comprehension: 5-Understands complex 90% of the time/Cues < 10% of the time  Expression Expression Mode: Verbal Expression: 5-Expresses complex 90% of the time/cues < 10% of the time  Social Interaction Social Interaction: 6-Interacts appropriately with others with medication or extra time (anti-anxiety, antidepressant).  Problem Solving Problem Solving: 5-Solves complex 90% of the time/cues < 10% of the time  Memory Memory: 5-Recognizes or recalls 90% of the time/requires cueing < 10% of the time    LOS (Days) 3 A FACE TO FACE EVALUATION WAS PERFORMED  KIRSTEINS,ANDREW E 04/09/2013, 8:15 AM    obj: BP 127/68  Pulse 61  Temp(Src) 98.1 F (36.7 C) (Oral)  Resp 19  Ht 6\' 3"  (1.905 m)  Wt 103.1 kg (227 lb 4.7 oz)  BMI 28.41 kg/m2  SpO2 96%  Pleasant male in no acute distress. Chest clear to auscultation. Cardiac exam S1-S2 are regular. Abdominal exam overweight, and bowel sounds. Extremities no edema. Musculoskeletal swelling at the left first MTP joint with erythema and warmth.   A/P Medical Problem List and Plan:  1. Embolic right parietal and caudate infarct  2. DVT Prophylaxis/Anticoagulation: Continues Xarelto  3. Pain Management: Tylenol as needed  4. Mood: Celexa 20 mg daily. Provide emotional support  5. Neuropsych: This patient is capable of making decisions on his own behalf.  6. Paroxysmal atrial fibrillation/pacemaker. Continue Xarelto. Cardiac rate control.  7. Hypertension. Norvasc 10 mg daily. Blood pressure 146-159/74-77. Will add low-dose ACE inhibitor. 8. Hyperlipidemia. Continue Lipitor, Zetia  9. Psoriatic arthritis. Continue prednisone. Discuss resuming  weekly methotrexate  10. BPH/TURP. Patient will use his home dose of VESIcare. We'll continue Myrbetriq 11. Adhesive capsulitis right shoulder. Cont PT/OT 12. Gout- reviewed medication list. This is a new problem for him. He's been on prednisone for psoriatic arthritis for years. I'll increase prednisone to 20 mg daily for 3 days and then resume 5 mg.

## 2013-04-09 NOTE — Progress Notes (Signed)
[facility] Individual Statement of Services  Patient Name:  Christopher Baker  Date:  04/09/2013  Welcome to the [facility]patient Rehabilitation [facility].  Our goal is to provide you with an individualized program based on your diagnosis and situation, designed to meet your specific needs.  With this comprehensive rehabilitation program, you will be expected to participate in at least 3 hours of rehabilitation therapies Monday-Friday, with modified therapy programming on the weekends.  Your rehabilitation program will include the following services:  Physical Therapy (PT), Occupational Therapy (OT), Speech Therapy (ST), 24 hour per day rehabilitation nursing, Neuropsychology, Case Management (Social Worker), Rehabilitation Medicine, Nutrition Services and Pharmacy Services  Weekly team conferences will be held on Wednesdays to discuss your progress.  Your Social Worker will talk with you frequently to get your input and to update you on team discussions.  Team conferences with you and your family in attendance may also be held.  Expected length of stay:  1 -2 weeks  Overall anticipated outcome:  Modified Independent  Depending on your progress and recovery, your program may change. Your Social Worker will coordinate services and will keep you informed of any changes. Your Social Worker's name and contact numbers are listed  below.  The following services may also be recommended but are not provided by the Inpatient Rehabilitation [facility]:   Driving Evaluations  Home Health Rehabiltiation Services  Outpatient Rehabilitation Services   Arrangements will be made to provide these services after discharge if needed.  Arrangements include referral to agencies that provide these services.  Your insurance has been verified to be:  [employer] Your primary doctor is:  Dr. Darryll Capers  Pertinent information will be shared with your doctor and your insurance company.  Social Worker:   Staci Acosta, LCSW  [phone] or ([phone]  Information discussed with and copy given to patient by: Elvera Lennox, 04/09/2013, 11:50 PM

## 2013-04-09 NOTE — Progress Notes (Signed)
Patient information reviewed and entered into eRehab system by Nicolas Sisler, RN, CRRN, PPS Coordinator.  Information including medical coding and functional independence measure will be reviewed and updated through discharge.     Per nursing patient was given "Data Collection Information Summary for Patients in Inpatient Rehabilitation Facilities with attached "Privacy Act Statement-Health Care Records" upon admission.  

## 2013-04-09 NOTE — Progress Notes (Signed)
Speech Language Pathology Daily Session Note  Patient Details  Name: Christopher Baker MRN: [id number] Date of Birth: 12-29-1933  Today's Date: 04/09/2013 Time: 4098-1191 Time Calculation (min): 45 min  Short Term Goals: Week 1: SLP Short Term Goal 1 (Week 1): Patient will demonstrate complex problem solving during functional activities with min A multimodal cueing, in order to return to prior level of independence.  SLP Short Term Goal 2 (Week 1): Patient will attend to left field of environment during functional tasks with min A multimodal cueing, in order to return to prior level of independence.  SLP Short Term Goal 3 (Week 1): Patient will participate in short term memory activities with min A multimodal cueing, in order to return to prior level of independence.   Skilled Therapeutic Interventions: Treatment focused on cognitive goals. SLP facilitated session with checkbook balancing activity. Pt completed task with Min cues. Pt required Mod cues for utilization of external aids for recall of new/daily information. SLP instructed pt to write down therapy activities on daily schedule to facilitate recall. Continue plan of care.   FIM:  Comprehension Comprehension Mode: Auditory Comprehension: 5-Understands complex 90% of the time/Cues < 10% of the time Expression Expression Mode: Verbal Expression: 5-Expresses complex 90% of the time/cues < 10% of the time Social Interaction Social Interaction: 6-Interacts appropriately with others with medication or extra time (anti-anxiety, antidepressant). Problem Solving Problem Solving: 5-Solves complex 90% of the time/cues < 10% of the time Memory Memory: 4-Recognizes or recalls 75 - 89% of the time/requires cueing 10 - 24% of the time  Pain Pain Assessment Pain Assessment: No/denies pain  Therapy/Group: Individual Therapy  [name] 04/09/2013, 12:16 PM  [name], M.A. CCC-SLP [phone]

## 2013-04-10 ENCOUNTER — Inpatient Hospital Stay (HOSPITAL_COMMUNITY): Payer: Medicare Other | Admitting: *Deleted

## 2013-04-10 ENCOUNTER — Inpatient Hospital Stay (HOSPITAL_COMMUNITY): Payer: Medicare Other

## 2013-04-10 ENCOUNTER — Inpatient Hospital Stay (HOSPITAL_COMMUNITY): Payer: Medicare Other | Admitting: Occupational Therapy

## 2013-04-10 NOTE — Progress Notes (Signed)
Speech Language Pathology Daily Session Note  Patient Details  Name: Christopher Baker MRN: [id number] Date of Birth: Nov 17, 1933  Today's Date: 04/10/2013 Time: 4098-1191 Time Calculation (min): 45 min  Short Term Goals: Week 1: SLP Short Term Goal 1 (Week 1): Patient will demonstrate complex problem solving during functional activities with min A multimodal cueing, in order to return to prior level of independence.  SLP Short Term Goal 2 (Week 1): Patient will attend to left field of environment during functional tasks with min A multimodal cueing, in order to return to prior level of independence.  SLP Short Term Goal 3 (Week 1): Patient will participate in short term memory activities with min A multimodal cueing, in order to return to prior level of independence.   Skilled Therapeutic Interventions: Treatment focused on cognitive goals. SLP facilitated session with scheduling task requiring complex problem solving skills and organizational skills. Pt sustained attention to task for 40 minutes without redirective cues, however required Max cues for complex problem solving, organization, and working memory. Although pt does endorse difficulty upon completion of task, he attributes it to his B12 deficiency and lacks intellectual awareness of cognitive changes s/p acute CVA. Continue plan of care.   FIM:  Comprehension Comprehension Mode: Auditory Comprehension: 5-Follows basic conversation/direction: With no assist Expression Expression Mode: Verbal Expression: 5-Expresses basic needs/ideas: With no assist Social Interaction Social Interaction: 6-Interacts appropriately with others with medication or extra time (anti-anxiety, antidepressant). Problem Solving Problem Solving: 5-Solves basic 90% of the time/requires cueing < 10% of the time Memory Memory: 4-Recognizes or recalls 75 - 89% of the time/requires cueing 10 - 24% of the time  Pain Pain Assessment Pain Assessment: No/denies  pain  Therapy/Group: Individual Therapy  [name] 04/10/2013, 2:16 PM  [name], M.A. CCC-SLP [phone]

## 2013-04-10 NOTE — Progress Notes (Signed)
Social Work Assessment and Plan  Patient Details  Name: Christopher Baker MRN: [id number] Date of Birth: [birth year]  Today's Date: 04/10/2013  Problem List:  Patient Active Problem List   Diagnosis Date Noted  . Embolic cerebral infarction 04/09/2013  . CVA (cerebral infarction) 04/03/2013  . Occlusion and stenosis of carotid artery without mention of cerebral infarction 11/21/2012  . OA (osteoarthritis) of knee 12/20/2011  . Methotrexate, long term, current use 09/10/2011  . Knee pain, left 07/27/2011  . Pacemaker-Medtronic 09/08/2010  . UNSPECIFIED VISUAL DISTURBANCE 07/20/2010  . SYNCOPE 06/01/2010  . IRON DEFIC ANEMIA SEC DIET IRON INTAKE 04/20/2010  . PSORIASIS 04/20/2010  . HEADACHE, PERSISTENT 01/13/2010  . HEMATURIA UNSPECIFIED 11/19/2009  . PHLEBITIS AND THROMBOPHLEBITIS OF OTHER SITE 07/02/2009  . CELLULITIS, LEG, LEFT 07/02/2009  . DISUSE OSTEOPOROSIS 05/12/2009  . HYPERGLYCEMIA, BORDERLINE 01/10/2009  . SPONDYLOSIS, LUMBAR, WITH RADICULOPATHY 10/15/2008  . OTHER SPECIFIED DERMATOMYCOSES 06/24/2008  . EXTRINSIC ASTHMA, WITH EXACERBATION 05/01/2008  . CALCU GALLBLADD W/OTH CHOLECYSTITIS&OBSTRUCTION 02/20/2008  . VARICOSE VEINS LOWER EXTREMITIES W/INFLAMMATION 12/25/2007  . EDEMA 12/25/2007  . INSOMNIA WITH SLEEP APNEA UNSPECIFIED 08/31/2007  . GERD 03/23/2007  . FIBRILLATION, ATRIAL 01/31/2007  . PSORIATIC ARTHRITIS 01/31/2007  . ABDOMINAL BLOATING 01/31/2007  . HYPERLIPIDEMIA 01/19/2007  . HYPERTENSION 01/19/2007  . PROSTATE CANCER, HX OF 01/19/2007   Past Medical History:  Past Medical History  Diagnosis Date  . Hyperlipidemia   . HTN (hypertension)   . Status post placement of cardiac pacemaker DDD-  06-04-10-- LAST CHECK 09-08-10 IN EPIC    SECONDARY TO SYNCOPY AND BRADYCARDIA  . Restless leg syndrome   . Normal nuclear stress test 2008    LOW RISK--   DR  WALL  . Carotid stenosis, bilateral PER DR EARLY / DUPLEX  09-09-10      40 - 59%    BILATERALLY--- ASYMPTOMATIC  . History of echocardiogram 11-21-2006    EF 65%  . History of prostate cancer S/P RADIATION  TX  6 YRS AGO  . Psoriasis   . Arthritis with psoriasis   . GERD (gastroesophageal reflux disease)     CONTROLLED W/ PROTONIX  . Borderline diabetes     DIET CONTROLLED - PT STATES HE IS NOT DIABETIC  . Lumbar spondylosis W/ RADICULOPATHY  . Iron deficiency   . Cataract immature LEFT EYE  . PAF (paroxysmal atrial fibrillation)   . SVT (supraventricular tachycardia)     S/P ABLATION   2008  . Coronary artery disease CARDIOLOGIST- DR Sharrell Ku    09-08-10 VISIT AND PACEMAKER CHECK  IN EPIC  . Cancer     HX OF PROSTATE CANCER--TX'D WITH RADIATION  . Sleep apnea TESTED YRS AGO-- NO CPAP RX GIVEN    PT STATES HE DOES NOT THINK HE STILL HAS SLEEP APNEA--DOES NOT USE CPAP   Past Surgical History:  Past Surgical History  Procedure Laterality Date  . Replacement total knee  1997    RIGHT  . Cardiac pacemaker placement  06-04-10    DDD/ AND REMOVAL LOOR RECORDER  . Loop recorder placement  01-30-10  . Lumbar laminectomy/ diskectomy/ fusion  05-19-10    L4 - 5  . Cholecystectomy  2009  . Cardiac electrophysiology study and ablation  2008    FOR SVT  . Prostate biopsy  2007  . Transurethral resection of prostate  2006  . Inguinal hernia repair  2005    LEFT/   DONE WITH PENILE PROSTESIS SURG.  . Removal and placement  penile prosthesis implant   2005    AND LEFT CORPOROPLASTY /    DONE INGUINAL REPAIR  . Penile prosthesis implant  1995  . Knee arthroscopy  06/02/2011    Procedure: ARTHROSCOPY KNEE;  Surgeon: Loanne Drilling;  Location: [facility];  Service: Orthopedics;  Laterality: Left;  LEFT KNEE ARTHROSCOPY WITH DEBRIDEMENT  . Total knee arthroplasty  12/20/2011    Procedure: TOTAL KNEE ARTHROPLASTY;  Surgeon: Loanne Drilling, MD;  Location: [facility];  Service: Orthopedics;  Laterality: Left;  . Back surgery     Social History:  reports that  he has quit smoking. His smoking use included Cigarettes. He has a 5.5 pack-year smoking history. He has never used smokeless tobacco. He reports that he drinks alcohol. He reports that he does not use illicit drugs.  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  Has people he trusts in place to run things while he can't. Legal Hisotry/Current Legal Issues: None Guardian/Conservator: N/A   Abuse/Neglect Physical Abuse: Denies Verbal Abuse: Denies Sexual Abuse: Denies Exploitation of patient/patient's resources: Denies Self-Neglect: Denies  Emotional Status Pt's affect, behavior and adjustment status: Pt is motivated to return to his previous functional level. Recent Psychosocial Issues: None noted Pyschiatric History: Pt noted to be on Celexa.  CSW to further assess this with pt. Substance Abuse History: None  Patient / Family Perceptions, Expectations & Goals Pt/Family understanding of illness & functional limitations: Pt is grateful to his wife for quickly recognizing symptoms stroke.  He was resistant at first to wife calling 911, but realizes now it was the best thing for him. Premorbid pt/family roles/activities: Pt runs his own business, helps his wife with household chores, and  plays golf 2-3 times per week. Anticipated changes in roles/activities/participation: Pt knows it will take some time to get back fully to his activities, but expects to soon. Pt/family expectations/goals: Pt wants "to be back at 100%."  He wants to get back to golfing.  Community Resources [employer]: None Premorbid Home Care/DME Agencies: None Transportation available at discharge: wife Resource referrals recommended: Neuropsychology;Support group (specify) ([facility])  Discharge Planning Living Arrangements: Spouse/significant other Support Systems: Spouse/significant other;Children;Friends/neighbors;Other (Comment) (employees) Type of Residence: Private residence (home association takes care of yard and exterior of home) Insurance Resources: Forensic psychologist) Surveyor, quantity Resources: Employment;Social Theatre manager Screen Referred: No Living Expenses: Database administrator Management: Patient;Spouse Does the patient have any problems obtaining your medications?: No Home Management: Pt and wife manage home together.  WIfe is able to pay household bills during this time. Patient/Family Preliminary Plans: Pt plans to go home with his wife at d/c from [facility]. Social Work Anticipated Follow Up Needs: HH/OP;Support Group ([facility]) Expected length of stay: ELOS 10 to 14 days  Clinical Impression CSW met with pt to complete assessment and to introduce self to pt and to educate him on CSW role while he's at CIR.  Pt's  wife was not present during assessment visit, so CSW will plan to meet with her at another time soon.  Pt reports she will be with him 24/7 at d/c, thought.  Pt wants to get back to 100% so that he can continue to run his business and play golf.  CSW encouraged pt to use this rehab time for his recovery and to be patient.  PT came in at the end of CSW visit and confirmed with pt that stroke can make him more tired and all the therapy they are putting pt through  brings even more fatigue, so she encouraged pt to rest when he can in between and at the end of therapies, keeping work time to a minimum.  Pt told CSW that he does have people who he trusts who can run things without him.  Pt lives on main floor in his home and does not need to go upstairs at this time.  CSW explained to pt that we would get any necessary equipment and arrange any follow up therapies.  Pt expressed understanding and appreciation.  CSW will continue to follow pt and will meet with pt's wife.  CSW to also continue to check in with pt on how he is feeling emotionally.  Kanetra Ho, [place] 04/10/2013, 12:15 AM

## 2013-04-10 NOTE — Progress Notes (Signed)
Physical Therapy Session Note  Patient Details  Name: Christopher Baker MRN: [id number] Date of Birth: [birth year]  Today's Date: 04/10/2013 Time:  8:05-9:05 ( ) and 14:20-14:45 ( )   Short Term Goals: Week 1:  PT Short Term Goal 1 (Week 1): Short term goals equal long term goals  Skilled Therapeutic Interventions/Progress Updates:  1:2   Tx focused on dynamic balance, stairs, and gait training. Wife present for tx this morning.  Dynamic sitting balance EOB with S.   All transferes, including toilet with Supervision. Pt needing safety cues throughout tx for falls risk reduction.   Gait in room and on unit x150' with min-guard/min A and no device, with cues for decreasing speed, especially during turns and busy environments. Pt had 3 LOB throughout, but able to recover with min A. Pt challenged to golf club in L hand while walking.   Dynamic balance tasks including each of the following with Min A overall: - side-stepping R/L x30' - retro-walking 2x30' - tandem walking x30' - walking with ball toss to self x100' - walking while kicking ball down hall x100' - alternating forwards step-taps to 6" with increased speed 2x20 - lateral step taps x20 R/L - putting golf x71min with pt retrieving balls  Stairs with 1 rail x10 with cues for safety and sequence, Min-guard A.   2:2  Pt up in recliner, ready for therapy, which focused on community mobility and dynamic gait.  Pt participated in community gait and mobility 2x300' with no device and min-guard>> close S over varying and uneven surfaces and inclines, including grass and mulch.  Pt able to navigate busy settings and tight spaces. Pt had no LOB, but needed 1 seated rest break due to fatgiue. Performed transfers from varying surfaces and furniture with S. Pt only needed cues for keeping wide BOS and to slow speed, especially during turns.  Dynamic gait in controlled environment including lateral and up/down head turns with min-guard  A. Pt with some difficulty maintaining straight path.  Pt left up in chair with all needs in reach.     Therapy Documentation Precautions:  Precautions Precautions: Fall;Other (comment) Precaution Comments: left field cut and inattention Restrictions Weight Bearing Restrictions: No Vital Signs: Therapy Vitals Temp: 97.4 F (36.3 C) Temp src: Oral Pulse Rate: 67 Resp: 19 BP: 136/73 mmHg Patient Position, if appropriate: Lying Oxygen Therapy SpO2: 96 % O2 Device: None (Room air) Pain: none     See FIM for current functional status  Therapy/Group: Individual Therapy  Clydene Laming, PT, DPT   04/10/2013, 8:28 AM

## 2013-04-10 NOTE — Progress Notes (Signed)
HPI: Christopher Baker is a 79 y.o. right-handed male with history of hypertension,paroxysmal atrial fibrillation with pacemaker. Patient on no anti-coagulation prior to admission secondary to inconsistent appointments and refused with anticoagulation.. Admitted 04/03/2013 with left-sided weakness and facial droop. Cranial CT scan showed right cortical and subcortical infarcts(right caudate head, right parietal lobe). Infarct felt to be embolic secondary to history of PAF. MRI not completed secondary to pacemaker. Carotid Doppler showed bilateral ICA stenosis 40-59%. Echocardiogram with ejection fraction 55% grade 1 diastolic dysfunction no wall motion abnormalities. CT angiogram head negative for posterior circulation without stenosis or major occlusion. Patient did not receive TPA. Neurology service followup recommendations of Xarelto for CVA prophylaxis. Patient is maintained on a regular diet. Patient maintained on chronic prednisone therapy for history of psoriatic arthritis as well as methotrexate. Physical and occupational therapy evaluations completed an ongoing with recommendations for physical medicine rehabilitation consult to consider inpatient rehabilitation services. Patient was felt to be a good candidate for inpatient rehabilitation services and was admitted for comprehensive rehabilitation program  Subjective: Patient complains of left great toe pain. Overall improving Subjective/Complaints:   Objective: Vital Signs: Blood pressure 136/73, pulse 67, temperature 97.4 F (36.3 C), temperature source Oral, resp. rate 19, height 6\' 3"  (1.905 m), weight 103.1 kg (227 lb 4.7 oz), SpO2 96.00%. No results found. Results for orders placed during the hospital encounter of 04/06/13 (from the past 72 hour(s))  CBC WITH DIFFERENTIAL     Status: Abnormal   Collection Time    04/09/13  5:24 AM      Result Value Range   WBC 9.4  4.0 - 10.5 K/uL   RBC 4.37  4.22 - 5.81 MIL/uL   Hemoglobin 13.9  13.0  - 17.0 g/dL   HCT 04.5  40.9 - 81.1 %   MCV 94.5  78.0 - 100.0 fL   MCH 31.8  26.0 - 34.0 pg   MCHC 33.7  30.0 - 36.0 g/dL   RDW 91.4  78.2 - 95.6 %   Platelets 190  150 - 400 K/uL   Neutrophils Relative % 73  43 - 77 %   Neutro Abs 6.9  1.7 - 7.7 K/uL   Lymphocytes Relative 12  12 - 46 %   Lymphs Abs 1.1  0.7 - 4.0 K/uL   Monocytes Relative 14 (*) 3 - 12 %   Monocytes Absolute 1.3 (*) 0.1 - 1.0 K/uL   Eosinophils Relative 1  0 - 5 %   Eosinophils Absolute 0.1  0.0 - 0.7 K/uL   Basophils Relative 0  0 - 1 %   Basophils Absolute 0.0  0.0 - 0.1 K/uL  COMPREHENSIVE METABOLIC PANEL     Status: Abnormal   Collection Time    04/09/13  5:24 AM      Result Value Range   Sodium 140  135 - 145 mEq/L   Potassium 3.7  3.5 - 5.1 mEq/L   Chloride 105  96 - 112 mEq/L   CO2 27  19 - 32 mEq/L   Glucose, Bld 95  70 - 99 mg/dL   BUN 18  6 - 23 mg/dL   Creatinine, Ser 2.13  0.50 - 1.35 mg/dL   Calcium 9.1  8.4 - 08.6 mg/dL   Total Protein 5.9 (*) 6.0 - 8.3 g/dL   Albumin 3.0 (*) 3.5 - 5.2 g/dL   AST 20  0 - 37 U/L   ALT 32  0 - 53 U/L   Alkaline Phosphatase 85  39 - 117 U/L   Total Bilirubin 0.4  0.3 - 1.2 mg/dL   GFR calc non Af Amer 64 (*) >90 mL/min   GFR calc Af Amer 74 (*) >90 mL/min   Comment: (NOTE)     The eGFR has been calculated using the CKD EPI equation.     This calculation has not been validated in all clinical situations.     eGFR's persistently <90 mL/min signify possible Chronic Kidney     Disease.     HEENT: normal Cardio: RRR and no murmurs Resp: CTA B/L and unlabored GI: BS positive and non distended Extremity:  Pulses positive and Edema Left great toe  Skin:   Intact and Other erythema Left foot greatest at 1st MTP Neuro: Alert/Oriented, Abnormal Motor 2-/5 Left delt, bi, tri, grip, 3- Left HF, KE, ADF, Abnormal FMC Ataxic/ dec FMC and Tone  increased Left UE flexor tone, and LLE ext tone, Tone:  hyper reflexia, Inattention and Other Left field cut Musc/Skel:   Extremity tender Left 1st MTP Gen NAD   Assessment/Plan: 1. Functional deficits secondary to Right parietal, caudate infarct Left hemiparesis, left neglect , left field cut which require 3+ hours per day of interdisciplinary therapy in a comprehensive inpatient rehab setting. Physiatrist is providing close team supervision and 24 hour management of active medical problems listed below. Physiatrist and rehab team continue to assess barriers to discharge/monitor patient progress toward functional and medical goals. FIM: FIM - Bathing Bathing Steps Patient Completed: Chest;Right Arm;Left Arm;Buttocks;Front perineal area;Left upper leg;Right lower leg (including foot);Abdomen;Right upper leg;Left lower leg (including foot) Bathing: 5: Supervision: Safety issues/verbal cues  FIM - Upper Body Dressing/Undressing Upper body dressing/undressing steps patient completed: Thread/unthread right sleeve of pullover shirt/dresss;Thread/unthread left sleeve of pullover shirt/dress;Put head through opening of pull over shirt/dress;Pull shirt over trunk Upper body dressing/undressing: 5: Supervision: Safety issues/verbal cues FIM - Lower Body Dressing/Undressing Lower body dressing/undressing steps patient completed: Thread/unthread right underwear leg;Thread/unthread right pants leg;Thread/unthread left pants leg;Don/Doff right shoe;Pull underwear up/down Lower body dressing/undressing: 3: Mod-Patient completed 50-74% of tasks  FIM - Toileting Toileting steps completed by patient: Adjust clothing prior to toileting;Performs perineal hygiene Toileting Assistive Devices: Grab bar or rail for support Toileting: 3: Mod-Patient completed 2 of 3 steps  FIM - Diplomatic Services operational officer Devices: Grab bars Toilet Transfers: 5-From toilet/BSC: Supervision (verbal cues/safety issues);5-To toilet/BSC: Supervision (verbal cues/safety issues)  FIM - Press photographer Assistive  Devices: Bed rails;HOB elevated Bed/Chair Transfer: 5: Bed > Chair or W/C: Supervision (verbal cues/safety issues)  FIM - Locomotion: Wheelchair Locomotion: Wheelchair: 0: Activity did not occur FIM - Locomotion: Ambulation Locomotion: Ambulation Assistive Devices: Other (comment) (none) Ambulation/Gait Assistance: 4: Min guard Locomotion: Ambulation: 4: Travels 150 ft or more with minimal assistance (Pt.>75%)  Comprehension Comprehension Mode: Auditory Comprehension: 5-Understands complex 90% of the time/Cues < 10% of the time  Expression Expression Mode: Verbal Expression: 5-Expresses complex 90% of the time/cues < 10% of the time  Social Interaction Social Interaction: 6-Interacts appropriately with others with medication or extra time (anti-anxiety, antidepressant).  Problem Solving Problem Solving: 5-Solves complex 90% of the time/cues < 10% of the time  Memory Memory: 5-Recognizes or recalls 90% of the time/requires cueing < 10% of the time    LOS (Days) 4 A FACE TO FACE EVALUATION WAS PERFORMED  KIRSTEINS,ANDREW E 04/10/2013, 7:39 AM    obj: BP 136/73  Pulse 67  Temp(Src) 97.4 F (36.3 C) (Oral)  Resp 19  Ht 6'  3" (1.905 m)  Wt 103.1 kg (227 lb 4.7 oz)  BMI 28.41 kg/m2  SpO2 96%  Pleasant male in no acute distress. Chest clear to auscultation. Cardiac exam S1-S2 are regular. Abdominal exam overweight, and bowel sounds. Extremities no edema. Musculoskeletal swelling at the left first MTP joint with erythema and warmth.   A/P Medical Problem List and Plan:  1. Embolic right parietal and caudate infarct  2. DVT Prophylaxis/Anticoagulation: Continues Xarelto  3. Pain Management: Tylenol as needed  4. Mood: Celexa 20 mg daily. Provide emotional support  5. Neuropsych: This patient is capable of making decisions on his own behalf.  6. Paroxysmal atrial fibrillation/pacemaker. Continue Xarelto. Cardiac rate control.  7. Hypertension. Norvasc 10 mg daily. Blood  pressure 146-159/74-77. Will add low-dose ACE inhibitor. 8. Hyperlipidemia. Continue Lipitor, Zetia  9. Psoriatic arthritis. Continue prednisone. Discuss resuming weekly methotrexate  10. BPH/TURP. Patient will use his home dose of VESIcare. We'll continue Myrbetriq 11. Adhesive capsulitis right shoulder. Cont PT/OT 12. Gout- reviewed medication list. This is a new problem for him. He's been on prednisone for psoriatic arthritis for years. increase prednisone to 20 mg daily for 3 days and then resume 5 mg, may need another burst if gout flares again

## 2013-04-10 NOTE — Progress Notes (Signed)
Occupational Therapy Session Note  Patient Details  Name: Christopher Baker MRN: [id number] Date of Birth: 03/03/1934  Today's Date: 04/10/2013 Time: 0102-7253 Time Calculation (min): 60 min  Short Term Goals: No short term goals set  Skilled Therapeutic Interventions/Progress Updates:    Pt was scheduled for B/D this am but he was already dressed and declined a shower. Pt went to gym to work on various visual scanning exercises and L hand FMC with grasping and releasing large pegs, shuffling cards, twisting bolts, fastening small PVC pipes together and squeezing clothespin. Pt has increased composite finger flex and extension but is still lacking active thumb movement.  He was able to quickly remove pegs from board, but had a difficult time replacing them.  He also worked on wall slides focusing on wrist and finger ext. He is progressing with his left hand movement well. He completed visual scanning sheets well and ambulated back to room with supervision with no LOB.  Pt resting in recliner at end of session.    Therapy Documentation Precautions:  Precautions Precautions: Fall;Other (comment) Precaution Comments: left field cut and inattention Restrictions Weight Bearing Restrictions: No  Pain: Pain Assessment Pain Assessment: No/denies pain  See FIM for current functional status  Therapy/Group: Individual Therapy  SAGUIER,JULIA 04/10/2013, 12:06 PM

## 2013-04-11 ENCOUNTER — Inpatient Hospital Stay (HOSPITAL_COMMUNITY): Payer: Medicare Other | Admitting: *Deleted

## 2013-04-11 ENCOUNTER — Inpatient Hospital Stay (HOSPITAL_COMMUNITY): Payer: Medicare Other

## 2013-04-11 ENCOUNTER — Inpatient Hospital Stay (HOSPITAL_COMMUNITY): Payer: Medicare Other | Admitting: Physical Therapy

## 2013-04-11 ENCOUNTER — Inpatient Hospital Stay (HOSPITAL_COMMUNITY): Payer: Medicare Other | Admitting: Occupational Therapy

## 2013-04-11 NOTE — Progress Notes (Signed)
Physical Therapy Note  Patient Details  Name: Christopher Baker MRN: [id number] Date of Birth: Jul 09, 1933 Today's Date: 04/11/2013  1405 -1500 (55 minutes) individual Pain: no reported pain Focus of treatment: Therapeutic exercise focused on left LE strengthening/control/ activity tolerance; Neuro LT LE Treatment: gait to /from PT- 120 feet SBA ; Nustep level 6 X 6 minutes with 2 rest breaks (pulse 87); bilateral prone hip extension 2 X 10, knee flexion 2X 10, sidelying left hip abduction X 10; Kinetron in standing without UE support 3 X 20 to challenge balance ( pt able to maintain midline with good left knee control in stance.     Letzy Gullickson,JIM 04/11/2013, 2:10 PM

## 2013-04-11 NOTE — Progress Notes (Signed)
Social Work Patient ID: Suzan Garibaldi, male   DOB: [birth year], 79 y.o.   MRN: [id number]  CSW met with pt to update him on team conference discussion.  Pt admits to having a left field cut and recognizes that this keeps him from being completely safe independently.  Pt will most likely reach a modified independent level, but will probably still need his wife's support and assistance.  He again stated that she will be able to provide this, however, CSW still needs to meet/talk with her to confirm, given pt does have significant memory issues and some cognitive deficits.  Pt will need a tub bench and outpatient therapies.  Pt was pleased with targeted d/c date of 04-14-13.  CSW to continue to follow and assist pt as needed.

## 2013-04-11 NOTE — Progress Notes (Signed)
Occupational Therapy Session Note  Patient Details  Name: Christopher Baker MRN: [id number] Date of Birth: 1933/11/18  Today's Date: 04/11/2013 Time: 0800-0915 Time Calculation (min): 75 min   Skilled Therapeutic Interventions/Progress Updates:      Pt seen for BADL retraining of toileting, bathing, and dressing with a focus on dynamic standing balance, L side awareness and functional use of LUE with family education with spouse.  Pt worked on ambulating around room, reaching into low drawers to retrieve clothing, gathered supplies, completed toileting and bathing without supervision.  He was able to shave himself with good control and left side awareness. He used his left hand to actively pull up his pants and don his socks. He then ambulated to tub room to practice tub bench transfers with only distant supervision and then walked back to his room with no LOB and good awareness of environment.  Pt resting in recliner with wife in the room at end of session.  Therapy Documentation Precautions:  Precautions Precautions: Fall;Other (comment) Precaution Comments: left field cut and inattention Restrictions Weight Bearing Restrictions: No  Pain: No c/o Pain.   ADL:  See FIM for current functional status  Therapy/Group: Individual Therapy  SAGUIER,JULIA 04/11/2013, 9:36 AM

## 2013-04-11 NOTE — Progress Notes (Signed)
Speech Language Pathology Daily Session Note  Patient Details  Name: Christopher Baker MRN: [id number] Date of Birth: January 24, 1934  Today's Date: 04/11/2013 Time: 0915-1000 Time Calculation (min): 45 min  Short Term Goals: Week 1: SLP Short Term Goal 1 (Week 1): Patient will demonstrate complex problem solving during functional activities with min A multimodal cueing, in order to return to prior level of independence.  SLP Short Term Goal 2 (Week 1): Patient will attend to left field of environment during functional tasks with min A multimodal cueing, in order to return to prior level of independence.  SLP Short Term Goal 3 (Week 1): Patient will participate in short term memory activities with min A multimodal cueing, in order to return to prior level of independence.   Skilled Therapeutic Interventions: Treatment focused on cognitive goals. SLP facilitated session with scheduling task requiring complex problem solving skills, working memory, and Equities trader. Pt sustained attention to task for 30 min without redirective cues. Today he performed this type of task more quickly, requiring Mod cues for complex problem solving and utilization of external aids to facilitate working memory. [name] present for session and said that he appeared to have increased difficulty with this task than he would have had prior to his CVA. Continue plan of care.   FIM:  Comprehension Comprehension Mode: Auditory Comprehension: 5-Follows basic conversation/direction: With no assist Expression Expression Mode: Verbal Expression: 5-Expresses basic needs/ideas: With no assist Social Interaction Social Interaction: 6-Interacts appropriately with others with medication or extra time (anti-anxiety, antidepressant). Problem Solving Problem Solving: 5-Solves basic 90% of the time/requires cueing < 10% of the time Memory Memory: 4-Recognizes or recalls 75 - 89% of the time/requires cueing 10 - 24% of the  time  Pain Pain Assessment Pain Assessment: No/denies pain  Therapy/Group: Individual Therapy  [name] 04/11/2013, 10:18 AM  [name], M.A. CCC-SLP [phone]

## 2013-04-11 NOTE — Progress Notes (Signed)
Physical Therapy Session Note  Patient Details  Name: TORRY ISTRE MRN: [id number] Date of Birth: [birth year]  Today's Date: 04/11/2013 Time: 1000-1025 Time Calculation (min): 25 min  Short Term Goals: Week 1:  PT Short Term Goal 1 (Week 1): Short term goals equal long term goals  Skilled Therapeutic Interventions/Progress Updates:    Patient received seated in recliner, wife present. Session focused on gait training, functional transfers, standing balance, and problem solving during way finding. Patient instructed in gait training and functional cognitive task, navigating from his room to the lobby/gift shop of the hospital. Patient supervision level for gait training with no noted LOB. Patient required mod verbal cues for determining his location in the hospital, the location of the gift shop, and for use of signs and maps to assist him in reaching the destination. When using signs and maps patient required mod verbal and visual cues to attend to objects/information on his L side. Patient performed gait training 200'x2 supervision level while navigating to lobby, verbal cues for safety. Patient instructed in utilization of signs/maps/ and memory to return from lobby>rehab unit modA. Patient required modA verbal cues to locate appropriate central elevators, cuing for use of signs and maps. Patient able to operate elevator and choose appropriate floor number. Patient was able to identify that his room was #[address] [place], mod verbal cues for identifying how to reach his room. In room patient instructed in single leg stance x3 trials B LE, able to maintain SLS 15sec x 3 trials R LE with B UE support initially on therapists shoulders but progressed to no UE support with no LOB. Patient able to achieve single leg stance on LLE with B UE support on therapists shoulders 10secs x 3 trials. Patient instructed in standing perturbation, therapist provided forces to patients trunk in different directions to challenge  patients balance and balance strategies, patient demonstrated intact stepping strategies when perturbations provided, educated patient and wife about various balance strategies. Ended session patient in room, seated in recliner all needs within reach and wife present.  Therapy Documentation Precautions:  Precautions Precautions: Fall;Other (comment) Precaution Comments: left field cut and inattention Restrictions Weight Bearing Restrictions: No   Pain: Pain Assessment Pain Assessment: No/denies pain Pain Score: 0-No pain   Locomotion : Ambulation Ambulation/Gait Assistance: 5: Supervision   See FIM for current functional status  Therapy/Group: Individual Therapy  Doralee Albino 04/11/2013, 11:56 AM

## 2013-04-11 NOTE — Progress Notes (Signed)
HPI: Christopher Baker is a 79 y.o. right-handed male with history of hypertension,paroxysmal atrial fibrillation with pacemaker. Patient on no anti-coagulation prior to admission secondary to inconsistent appointments and refused with anticoagulation.. Admitted 04/03/2013 with left-sided weakness and facial droop. Cranial CT scan showed right cortical and subcortical infarcts(right caudate head, right parietal lobe). Infarct felt to be embolic secondary to history of PAF. MRI not completed secondary to pacemaker. Carotid Doppler showed bilateral ICA stenosis 40-59%. Echocardiogram with ejection fraction 55% grade 1 diastolic dysfunction no wall motion abnormalities. CT angiogram head negative for posterior circulation without stenosis or major occlusion. Patient did not receive TPA. Neurology service followup recommendations of Xarelto for CVA prophylaxis. Patient is maintained on a regular diet. Patient maintained on chronic prednisone therapy for history of psoriatic arthritis as well as methotrexate. Physical and occupational therapy evaluations completed an ongoing with recommendations for physical medicine rehabilitation consult to consider inpatient rehabilitation services. Patient was felt to be a good candidate for inpatient rehabilitation services and was admitted for comprehensive rehabilitation program   Subjective/Complaints: Toe pain improved, no foot cramps Am BPs increased my be SE of Myrbetriq Objective: Vital Signs: Blood pressure 162/72, pulse 69, temperature 98.2 F (36.8 C), temperature source Oral, resp. rate 18, height 6\' 3"  (1.905 m), weight 103.1 kg (227 lb 4.7 oz), SpO2 96.00%. No results found. Results for orders placed during the hospital encounter of 04/06/13 (from the past 72 hour(s))  CBC WITH DIFFERENTIAL     Status: Abnormal   Collection Time    04/09/13  5:24 AM      Result Value Range   WBC 9.4  4.0 - 10.5 K/uL   RBC 4.37  4.22 - 5.81 MIL/uL   Hemoglobin 13.9  13.0  - 17.0 g/dL   HCT 21.3  08.6 - 57.8 %   MCV 94.5  78.0 - 100.0 fL   MCH 31.8  26.0 - 34.0 pg   MCHC 33.7  30.0 - 36.0 g/dL   RDW 46.9  62.9 - 52.8 %   Platelets 190  150 - 400 K/uL   Neutrophils Relative % 73  43 - 77 %   Neutro Abs 6.9  1.7 - 7.7 K/uL   Lymphocytes Relative 12  12 - 46 %   Lymphs Abs 1.1  0.7 - 4.0 K/uL   Monocytes Relative 14 (*) 3 - 12 %   Monocytes Absolute 1.3 (*) 0.1 - 1.0 K/uL   Eosinophils Relative 1  0 - 5 %   Eosinophils Absolute 0.1  0.0 - 0.7 K/uL   Basophils Relative 0  0 - 1 %   Basophils Absolute 0.0  0.0 - 0.1 K/uL  COMPREHENSIVE METABOLIC PANEL     Status: Abnormal   Collection Time    04/09/13  5:24 AM      Result Value Range   Sodium 140  135 - 145 mEq/L   Potassium 3.7  3.5 - 5.1 mEq/L   Chloride 105  96 - 112 mEq/L   CO2 27  19 - 32 mEq/L   Glucose, Bld 95  70 - 99 mg/dL   BUN 18  6 - 23 mg/dL   Creatinine, Ser 4.13  0.50 - 1.35 mg/dL   Calcium 9.1  8.4 - 24.4 mg/dL   Total Protein 5.9 (*) 6.0 - 8.3 g/dL   Albumin 3.0 (*) 3.5 - 5.2 g/dL   AST 20  0 - 37 U/L   ALT 32  0 - 53 U/L  Alkaline Phosphatase 85  39 - 117 U/L   Total Bilirubin 0.4  0.3 - 1.2 mg/dL   GFR calc non Af Amer 64 (*) >90 mL/min   GFR calc Af Amer 74 (*) >90 mL/min   Comment: (NOTE)     The eGFR has been calculated using the CKD EPI equation.     This calculation has not been validated in all clinical situations.     eGFR's persistently <90 mL/min signify possible Chronic Kidney     Disease.     HEENT: normal Cardio: RRR and no murmurs Resp: CTA B/L and unlabored GI: BS positive and non distended Extremity:  Pulses positive and Edema Left great toe  Skin:   Intact and Other erythema Left foot greatest at 1st MTP Neuro: Alert/Oriented, Abnormal Motor 2-/5 Left delt, bi, tri, grip, 3- Left HF, KE, ADF, Abnormal FMC Ataxic/ dec FMC and Tone  increased Left UE flexor tone, and LLE ext tone, Tone:  hyper reflexia, Inattention and Other Left field cut Musc/Skel:   Extremity tender Left 1st MTP Gen NAD   Assessment/Plan: 1. Functional deficits secondary to Right parietal, caudate infarct Left hemiparesis, left neglect , left field cut which require 3+ hours per day of interdisciplinary therapy in a comprehensive inpatient rehab setting. Physiatrist is providing close team supervision and 24 hour management of active medical problems listed below. Physiatrist and rehab team continue to assess barriers to discharge/monitor patient progress toward functional and medical goals. FIM: FIM - Bathing Bathing Steps Patient Completed: Chest;Right Arm;Left Arm;Buttocks;Front perineal area;Left upper leg;Right lower leg (including foot);Abdomen;Right upper leg;Left lower leg (including foot) Bathing: 5: Supervision: Safety issues/verbal cues  FIM - Upper Body Dressing/Undressing Upper body dressing/undressing steps patient completed: Thread/unthread right sleeve of pullover shirt/dresss;Thread/unthread left sleeve of pullover shirt/dress;Put head through opening of pull over shirt/dress;Pull shirt over trunk Upper body dressing/undressing: 5: Supervision: Safety issues/verbal cues FIM - Lower Body Dressing/Undressing Lower body dressing/undressing steps patient completed: Thread/unthread right underwear leg;Thread/unthread right pants leg;Thread/unthread left pants leg;Don/Doff right shoe;Pull underwear up/down Lower body dressing/undressing: 3: Mod-Patient completed 50-74% of tasks  FIM - Toileting Toileting steps completed by patient: Adjust clothing prior to toileting;Performs perineal hygiene Toileting Assistive Devices: Grab bar or rail for support Toileting: 4: Steadying assist  FIM - Diplomatic Services operational officer Devices: Grab bars Toilet Transfers: 5-From toilet/BSC: Supervision (verbal cues/safety issues);5-To toilet/BSC: Supervision (verbal cues/safety issues)  FIM - Banker Devices: Arm  rests Bed/Chair Transfer: 5: Bed > Chair or W/C: Supervision (verbal cues/safety issues)  FIM - Locomotion: Wheelchair Locomotion: Wheelchair: 0: Activity did not occur FIM - Locomotion: Ambulation Locomotion: Ambulation Assistive Devices: Other (comment) (none) Ambulation/Gait Assistance: 4: Min guard Locomotion: Ambulation: 4: Travels 150 ft or more with minimal assistance (Pt.>75%)  Comprehension Comprehension Mode: Auditory Comprehension: 5-Understands complex 90% of the time/Cues < 10% of the time  Expression Expression Mode: Verbal Expression: 5-Expresses complex 90% of the time/cues < 10% of the time  Social Interaction Social Interaction: 6-Interacts appropriately with others with medication or extra time (anti-anxiety, antidepressant).  Problem Solving Problem Solving: 5-Solves complex 90% of the time/cues < 10% of the time  Memory Memory: 4-Recognizes or recalls 75 - 89% of the time/requires cueing 10 - 24% of the time    LOS (Days) 5 A FACE TO FACE EVALUATION WAS PERFORMED  Masaji Billups E 04/11/2013, 8:02 AM    obj: BP 162/72  Pulse 69  Temp(Src) 98.2 F (36.8 C) (Oral)  Resp 18  Ht 6\' 3"  (1.905 m)  Wt 103.1 kg (227 lb 4.7 oz)  BMI 28.41 kg/m2  SpO2 96%  Pleasant male in no acute distress. Chest clear to auscultation. Cardiac exam S1-S2 are regular. Abdominal exam overweight, and bowel sounds. Extremities no edema. Musculoskeletal swelling at the left first MTP joint with erythema and warmth.   A/P Medical Problem List and Plan:  1. Embolic right parietal and caudate infarct  2. DVT Prophylaxis/Anticoagulation: Continues Xarelto  3. Pain Management: Tylenol as needed  4. Mood: Celexa 20 mg daily. Provide emotional support  5. Neuropsych: This patient is capable of making decisions on his own behalf.  6. Paroxysmal atrial fibrillation/pacemaker. Continue Xarelto. Cardiac rate control.  7. Hypertension. Norvasc 10 mg daily. Blood pressure  146-159/74-77. Will add low-dose ACE inhibitor. 8. Hyperlipidemia. Continue Lipitor, Zetia  9. Psoriatic arthritis. Continue prednisone. Discuss resuming weekly methotrexate  10. BPH/TURP. Patient will use his home dose of VESIcare. We'll discontinue Myrbetriq and monitor am BPs 11. Adhesive capsulitis right shoulder. Cont PT/OT 12. Gout- reviewed medication list. This is a new problem for him. He's been on prednisone for psoriatic arthritis for years. increase prednisone to 20 mg daily for 3 days and then resume 5 mg, may need another burst if gout flares again

## 2013-04-12 ENCOUNTER — Inpatient Hospital Stay (HOSPITAL_COMMUNITY): Payer: Medicare Other | Admitting: Occupational Therapy

## 2013-04-12 ENCOUNTER — Inpatient Hospital Stay (HOSPITAL_COMMUNITY): Payer: Medicare Other

## 2013-04-12 ENCOUNTER — Encounter (HOSPITAL_COMMUNITY): Payer: Medicare Other

## 2013-04-12 DIAGNOSIS — I6340 Cerebral infarction due to embolism of unspecified cerebral artery: Secondary | ICD-10-CM

## 2013-04-12 MED ORDER — LISINOPRIL 10 MG PO TABS
10.0000 mg | ORAL_TABLET | Freq: Two times a day (BID) | ORAL | Status: DC
  Administered 2013-04-12 – 2013-04-14 (×4): 10 mg via ORAL
  Filled 2013-04-12 (×6): qty 1

## 2013-04-12 NOTE — Progress Notes (Signed)
Reviewed and agree with the attached treatment note.  Ellese Julius, OTR/L 

## 2013-04-12 NOTE — Progress Notes (Signed)
Speech Language Pathology Daily Session Note  Patient Details  Name: Christopher Baker MRN: [id number] Date of Birth: 03/21/1934  Today's Date: 04/12/2013 Time: 4098-1191 Time Calculation (min): 45 min  Short Term Goals: Week 1: SLP Short Term Goal 1 (Week 1): Patient will demonstrate complex problem solving during functional activities with min A multimodal cueing, in order to return to prior level of independence.  SLP Short Term Goal 2 (Week 1): Patient will attend to left field of environment during functional tasks with min A multimodal cueing, in order to return to prior level of independence.  SLP Short Term Goal 3 (Week 1): Patient will participate in short term memory activities with min A multimodal cueing, in order to return to prior level of independence.   Skilled Therapeutic Interventions: Treatment focused on cognitive goals. SLP facilitated session with medication management task. Pt completed pill box task with Mod cues for working memory and utilization of external aid and stop notes to facilitate recall. Continue plan of care.   FIM:  Comprehension Comprehension Mode: Auditory Comprehension: 5-Follows basic conversation/direction: With extra time/assistive device Expression Expression Mode: Verbal Expression: 5-Expresses basic needs/ideas: With extra time/assistive device Social Interaction Social Interaction: 6-Interacts appropriately with others with medication or extra time (anti-anxiety, antidepressant). Problem Solving Problem Solving: 5-Solves basic 90% of the time/requires cueing < 10% of the time Memory Memory: 4-Recognizes or recalls 75 - 89% of the time/requires cueing 10 - 24% of the time  Pain Pain Assessment Pain Assessment: No/denies pain  Therapy/Group: Individual Therapy  Christopher Baker 04/12/2013, 9:37 AM  Christopher Baker, M.A. CCC-SLP [phone]

## 2013-04-12 NOTE — Progress Notes (Signed)
HPI: Christopher Baker is a 79 y.o. right-handed male with history of hypertension,paroxysmal atrial fibrillation with pacemaker. Patient on no anti-coagulation prior to admission secondary to inconsistent appointments and refused with anticoagulation.. Admitted 04/03/2013 with left-sided weakness and facial droop. Cranial CT scan showed right cortical and subcortical infarcts(right caudate head, right parietal lobe). Infarct felt to be embolic secondary to history of PAF. MRI not completed secondary to pacemaker. Carotid Doppler showed bilateral ICA stenosis 40-59%. Echocardiogram with ejection fraction 55% grade 1 diastolic dysfunction no wall motion abnormalities. CT angiogram head negative for posterior circulation without stenosis or major occlusion. Patient did not receive TPA. Neurology service followup recommendations of Xarelto for CVA prophylaxis. Patient is maintained on a regular diet. Patient maintained on chronic prednisone therapy for history of psoriatic arthritis as well as methotrexate. Physical and occupational therapy evaluations completed an ongoing with recommendations for physical medicine rehabilitation consult to consider inpatient rehabilitation services. Patient was felt to be a good candidate for inpatient rehabilitation services and was admitted for comprehensive rehabilitation program   Subjective/Complaints: AM BP still elevated No toe pain recurrence Objective: Vital Signs: Blood pressure 155/84, pulse 60, temperature 98.3 F (36.8 C), temperature source Oral, resp. rate 19, height 6\' 3"  (1.905 m), weight 103.3 kg (227 lb 11.8 oz), SpO2 95.00%. No results found. No results found for this or any previous visit (from the past 72 hour(s)).   HEENT: normal Cardio: RRR and no murmurs Resp: CTA B/L and unlabored GI: BS positive and non distended Extremity:  Pulses positive and Edema Left great toe  Skin:   Intact and Other erythema Left foot greatest at 1st MTP Neuro:  Alert/Oriented, Abnormal Motor 2-/5 Left delt, bi, tri, grip, 3- Left HF, KE, ADF, Abnormal FMC Ataxic/ dec FMC and Tone  increased Left UE flexor tone, and LLE ext tone, Tone:  hyper reflexia, Inattention and Other Left field cut Musc/Skel:  Extremity tender Left 1st MTP Gen NAD   Assessment/Plan: 1. Functional deficits secondary to Right parietal, caudate infarct Left hemiparesis, left neglect , left field cut which require 3+ hours per day of interdisciplinary therapy in a comprehensive inpatient rehab setting. Physiatrist is providing close team supervision and 24 hour management of active medical problems listed below. Physiatrist and rehab team continue to assess barriers to discharge/monitor patient progress toward functional and medical goals. Discussed D/C date with pt and wife FIM: FIM - Bathing Bathing Steps Patient Completed: Chest;Right Arm;Left Arm;Buttocks;Front perineal area;Left upper leg;Right lower leg (including foot);Abdomen;Right upper leg;Left lower leg (including foot) Bathing: 6: More than reasonable amount of time  FIM - Upper Body Dressing/Undressing Upper body dressing/undressing steps patient completed: Thread/unthread right sleeve of pullover shirt/dresss;Thread/unthread left sleeve of pullover shirt/dress;Put head through opening of pull over shirt/dress;Pull shirt over trunk Upper body dressing/undressing: 5: Supervision: Safety issues/verbal cues FIM - Lower Body Dressing/Undressing Lower body dressing/undressing steps patient completed: Thread/unthread right underwear leg;Thread/unthread right pants leg;Thread/unthread left pants leg;Don/Doff right shoe;Pull underwear up/down;Thread/unthread left underwear leg;Pull pants up/down;Don/Doff left shoe;Don/Doff left sock;Don/Doff right sock;Fasten/unfasten right shoe;Fasten/unfasten left shoe Lower body dressing/undressing: 5: Supervision: Safety issues/verbal cues  FIM - Toileting Toileting steps completed by  patient: Adjust clothing prior to toileting;Performs perineal hygiene;Adjust clothing after toileting Toileting Assistive Devices: Grab bar or rail for support Toileting: 6: More than reasonable amount of time  FIM - Diplomatic Services operational officer Devices: Grab bars Toilet Transfers: 5-From toilet/BSC: Supervision (verbal cues/safety issues);5-To toilet/BSC: Supervision (verbal cues/safety issues)  FIM - Banker  Devices: Arm rests Bed/Chair Transfer: 5: Chair or W/C > Bed: Supervision (verbal cues/safety issues);5: Bed > Chair or W/C: Supervision (verbal cues/safety issues)  FIM - Locomotion: Wheelchair Locomotion: Wheelchair: 0: Activity did not occur FIM - Locomotion: Ambulation Locomotion: Ambulation Assistive Devices: Other (comment) (none) Ambulation/Gait Assistance: 5: Supervision Locomotion: Ambulation: 5: Travels 150 ft or more with supervision/safety issues  Comprehension Comprehension Mode: Auditory Comprehension: 5-Understands complex 90% of the time/Cues < 10% of the time  Expression Expression Mode: Verbal Expression: 5-Expresses basic needs/ideas: With extra time/assistive device  Social Interaction Social Interaction: 6-Interacts appropriately with others with medication or extra time (anti-anxiety, antidepressant).  Problem Solving Problem Solving: 5-Solves complex 90% of the time/cues < 10% of the time  Memory Memory: 4-Recognizes or recalls 75 - 89% of the time/requires cueing 10 - 24% of the time    Medical Problem List and Plan:  1. Embolic right parietal and caudate infarct  2. DVT Prophylaxis/Anticoagulation: Continues Xarelto  3. Pain Management: Tylenol as needed  4. Mood: Celexa 20 mg daily. Provide emotional support  5. Neuropsych: This patient is capable of making decisions on his own behalf.  6. Paroxysmal atrial fibrillation/pacemaker. Continue Xarelto. Cardiac rate control.  7.  Hypertension. Norvasc 10 mg daily. Blood pressure 146-159/74-77. increaseACE inhibitor. 8. Hyperlipidemia. Continue Lipitor, Zetia  9. Psoriatic arthritis. Continue prednisone.  weekly methotrexate  10. BPH/TURP. Patient will use his home dose of VESIcare. We'll discontinue Myrbetriq and monitor am BPs 11. Adhesive capsulitis right shoulder. No c/os 12. Gout- improved after pred burst, back on home dose LOS (Days) 6 A FACE TO FACE EVALUATION WAS PERFORMED  KIRSTEINS,ANDREW E 04/12/2013, 8:33 AM    obj: BP 155/84  Pulse 60  Temp(Src) 98.3 F (36.8 C) (Oral)  Resp 19  Ht 6\' 3"  (1.905 m)  Wt 103.3 kg (227 lb 11.8 oz)  BMI 28.46 kg/m2  SpO2 95%  Pleasant male in no acute distress. Chest clear to auscultation. Cardiac exam S1-S2 are regular. Abdominal exam overweight, and bowel sounds. Extremities no edema. Musculoskeletal swelling at the left first MTP joint with erythema and warmth.

## 2013-04-12 NOTE — Progress Notes (Signed)
Nutrition Education Note  RD consulted for nutrition education regarding a Heart Healthy diet, per wife request.  Wife is VERY interested in outpatient education. Discussed with social Investment banker, operational to see if [facility] will pay for this. If not, provided local [facility] RD information to set up sessions via that avenue.   Lipid Panel     Component Value Date/Time   CHOL 169 04/04/2013 0530   TRIG 157* 04/04/2013 0530   HDL 50 04/04/2013 0530   CHOLHDL 3.4 04/04/2013 0530   VLDL 31 04/04/2013 0530   LDLCALC 88 04/04/2013 0530    RD provided "Heart Healthy Nutrition Therapy" handout from the [employer]. Reviewed patient's dietary recall. Provided examples on ways to decrease sodium and fat intake in diet. Discouraged intake of processed foods and use of salt shaker. Encouraged fresh fruits and vegetables as well as whole grain sources of carbohydrates to maximize fiber intake. Teach back method used.  Expect good compliance.  Body mass index is 28.46 kg/(m^2). Pt meets criteria for Overweight based on current BMI.  Current diet order is Heart Healthy, patient is consuming approximately 50-100% of meals at this time. Labs and medications reviewed. No further nutrition interventions warranted at this time. RD contact information provided. If additional nutrition issues arise, please re-consult RD.  Jarold Motto MS, RD, LDN Pager: [phone] After-hours pager: [phone]

## 2013-04-12 NOTE — Progress Notes (Signed)
Social Work Patient ID: Christopher Baker, male   DOB: [birth year], 79 y.o.   MRN: [id number]  CSW met with pt's wife to answer her questions and to update her on team conference and discuss d/c planning.  Wife is aware of pt's cognitive deficits now, but wasn't at first.  Her brother sustained a traumatic brain injury years ago, so she is familiar with dealing with deficits.  Wife plans to be with pt 24/7 and has little support for respite.  She is open to private duty sitters and so CSW provided private duty list.  CSW will also obtain handicap placard application for her to take to [facility].  CSW informed wife that outpatient therapies will be set up for pt and tub bench ordered.  Pt has used [facility] in the past, so CSW will order equipment from them.  CSW will also share [facility] support group information with pt/wife.  [name] was pleased that Neuropsychologist will assess pt today and inform him and wife of results of cognitive testing.  [name] is concerned that pt will not adhere to MD's restrictions re: not working and get upset with her.  CSW encouraged her to make the rehab staff "the bad guys" and just remind him that we all thought about what was best for the pt and his company.  Leavy Cella, Neuropsychologist, will recommend for outpt f/u if she feels it would be beneficial for pt.  CSW to continue to follow and assist as needed.

## 2013-04-12 NOTE — Progress Notes (Signed)
Occupational Therapy Session Note  Patient Details  Name: Christopher Baker MRN: [id number] Date of Birth: 1933-11-17  Today's Date: 04/12/2013 Time: 1400-1430 Time Calculation (min): 30 min  Short Term Goals: No STG set due to short LOS  Skilled Therapeutic Interventions/Progress Updates:    Pt seen 1:1 for focus in LUE strengthening, grasp and FMC. Pt lying supine in bed upon arrival. Pt able to sit EOB and apply both shoes on feet. Pt ambulated from room to gym with no LOB. Pt engaged in activities (resistive clothes pins, threading beads)  to improve grasp and strength in Left hand as well as performing [facility] activities. Pt reports decreased sensation in LUE impacting his ability to distinguish between sizes of items. Pt ambulated back to room with no LOB. Pt left sitting EOB with all needs in reach.   Therapy Documentation Precautions:  Precautions Precautions: Fall;Other (comment) Precaution Comments: left field cut and inattention Restrictions Weight Bearing Restrictions: No General:   Vital Signs:   Pain: Pain Assessment Pain Assessment: No/denies pain  See FIM for current functional status  Therapy/Group: Individual Therapy  Donna Christen 04/12/2013, 2:44 PM

## 2013-04-12 NOTE — Patient Care Conference (Signed)
Inpatient RehabilitationTeam Conference and Plan of Care Update Date: 04/12/2013   Time: 11:10 AM    Patient Name: Christopher Baker      Medical Record Number: [id number]  Date of Birth: 1933/12/28 Sex: Male         Room/Bed: 4M07C/4M07C-01 Payor Info: Payor: [employer] / Plan: BLUE MEDICARE / Product Type: *No Product type* /    Admitting Diagnosis: R CVA  Admit Date/Time:  04/06/2013  5:29 PM Admission Comments: No comment available   Primary Diagnosis:  Embolic cerebral infarction Principal Problem: Embolic cerebral infarction  Patient Active Problem List   Diagnosis Date Noted  . Embolic cerebral infarction 04/09/2013  . CVA (cerebral infarction) 04/03/2013  . Occlusion and stenosis of carotid artery without mention of cerebral infarction 11/21/2012  . OA (osteoarthritis) of knee 12/20/2011  . Methotrexate, long term, current use 09/10/2011  . Knee pain, left 07/27/2011  . Pacemaker-Medtronic 09/08/2010  . UNSPECIFIED VISUAL DISTURBANCE 07/20/2010  . SYNCOPE 06/01/2010  . IRON DEFIC ANEMIA SEC DIET IRON INTAKE 04/20/2010  . PSORIASIS 04/20/2010  . HEADACHE, PERSISTENT 01/13/2010  . HEMATURIA UNSPECIFIED 11/19/2009  . PHLEBITIS AND THROMBOPHLEBITIS OF OTHER SITE 07/02/2009  . CELLULITIS, LEG, LEFT 07/02/2009  . DISUSE OSTEOPOROSIS 05/12/2009  . HYPERGLYCEMIA, BORDERLINE 01/10/2009  . SPONDYLOSIS, LUMBAR, WITH RADICULOPATHY 10/15/2008  . OTHER SPECIFIED DERMATOMYCOSES 06/24/2008  . EXTRINSIC ASTHMA, WITH EXACERBATION 05/01/2008  . CALCU GALLBLADD W/OTH CHOLECYSTITIS&OBSTRUCTION 02/20/2008  . VARICOSE VEINS LOWER EXTREMITIES W/INFLAMMATION 12/25/2007  . EDEMA 12/25/2007  . INSOMNIA WITH SLEEP APNEA UNSPECIFIED 08/31/2007  . GERD 03/23/2007  . FIBRILLATION, ATRIAL 01/31/2007  . PSORIATIC ARTHRITIS 01/31/2007  . ABDOMINAL BLOATING 01/31/2007  . HYPERLIPIDEMIA 01/19/2007  . HYPERTENSION 01/19/2007  . PROSTATE CANCER, HX OF 01/19/2007     Expected Discharge Date: Expected Discharge Date: 04/14/13  Team Members Present: Physician leading conference: Dr. Claudette Laws Social Worker Present: Staci Acosta, LCSW Nurse Present: Other (comment) Tennis Must, RN) PT Present: Cyndia Skeeters, PT OT Present: Scherrie November, OT SLP Present: [name], SLP PPS Coordinator present : Edson Snowball, PT     Current Status/Progress Goal Weekly Team Focus  Medical   Elevated blood pressures in the morning, left field cut/left neglect appears to be improving  Maintain medical stability during rehabilitation stay  Adjust medications   Bowel/Bladder   pt continent of bowel and bladder  To continue continent to bowel and bladder      Swallow/Nutrition/ Hydration             ADL's   supervision with transfers, dressing, grooming; mod I bathing and toileting  mod I   LUE neuro re-ed, functional mobility, L visual scanning, pt education, ADL retraining   Mobility   Min-guard A gait and stairs, S transfers  Independent gait and transfers, S stairs  dynamic balance, safety training, gait and stairs, activity tolerance   Communication             Safety/Cognition/ Behavioral Observations  Max for intellectual awareness of cognitive deficits, complex problem solving  Min cues for complex problem solving/recall  external memory aids, complex problem solving, intellectual awareness   Pain   no complaints of pain other then gout in left big toe         Skin   scattered bruising to BUE with skin tears to LUE-tegaderm inplace  remain free of skin breakdown  assess and monitor skin qshift    Rehab Goals Patient on target to meet rehab  goals: Yes Rehab Goals Revised: None *See Care Plan and progress notes for long and short-term goals.  Barriers to Discharge: Still needs physical assistance, poor awareness of deficits, safety issues    Possible Resolutions to Barriers:  Continue rehabilitation program, arrange for  outpatient therapies post discharge    Discharge Planning/Teaching Needs:  Pt to go home with supervision from his wife.  Wife can be present for any family education she may need to receive.   Team Discussion:  Pt with left hemiparesis and left field cut still present.  Pt is currently lacking awareness of his cognitive deficits and brushes them off, although the wife is aware.  Pt also has significant memory issues.  Dr. Wynn Banker to inform pt that he cannot drive and should not be working until he follows up with him again.  Pt is supervision - min assist now with goals of modified independent.  Pt will need outpatient therapies and tub bench.  CSW is recommending pt for neuropsych for Thursday.  Revisions to Treatment Plan:  None   Continued Need for Acute Rehabilitation Level of Care: The patient requires daily medical management by a physician with specialized training in physical medicine and rehabilitation for the following conditions: Daily direction of a multidisciplinary physical rehabilitation program to ensure safe treatment while eliciting the highest outcome that is of practical value to the patient.: Yes Daily medical management of patient stability for increased activity during participation in an intensive rehabilitation regime.: Yes Daily analysis of laboratory values and/or radiology reports with any subsequent need for medication adjustment of medical intervention for : Neurological problems;Other  Keon Pender, [place] 04/12/2013, 9:22 AM

## 2013-04-12 NOTE — Progress Notes (Signed)
Occupational Therapy Session Note  Patient Details  Name: Christopher Baker MRN: [id number] Date of Birth: [birth year]  Today's Date: 04/12/2013 Time: 1030-1130 Time Calculation (min): 60 min  Short Term Goals: No short term goals set  Skilled Therapeutic Interventions/Progress Updates:      Pt seen for IADL activities of housekeeping (making bed and sweeping), laundry (folding towels and reaching to floor level), and meal prep (reaching and identifying items in cupboards) and discussing kitchen safety.  Pt demonstrated excellent visual scanning and good balance today.  To ensure safety against burns due to possible left inattention, recommended to pt and his wife that he always have full supervision with completing his IADL activities.  Pt then worked on L fine motor activities to develop  arom and strength.  Pt performed several a/arom exercises and theraputty exercises that he can do at home.  Pt provided with med soft theraputty.  He has improved arom of thumb in that he can move his thumb from the tip of his 2nd digit to 4th digit.  Pt returned to his room with his spouse.   Therapy Documentation Precautions:  Precautions Precautions: Fall;Other (comment) Precaution Comments: left field cut and inattention Restrictions Weight Bearing Restrictions: No    Pain: Pain Assessment Pain Assessment: No/denies pain  ADL:  See FIM for current functional status  Therapy/Group: Individual Therapy  SAGUIER,JULIA 04/12/2013, 11:50 AM

## 2013-04-12 NOTE — Progress Notes (Addendum)
Physical Therapy Note  Patient Details  Name: CLAUDIS GIOVANELLI MRN: [id number] Date of Birth: 1933/08/20 Today's Date: 04/12/2013  no pain reported  0950-1030 40 min 1505- 1545 40 min  individual therapy  Treatment 1:  neuromuscular re-education in sitting and standing for Fall Prevention I exs: knee ext/ hip abduction/knee flexion with 3# RLe, 2# L; mini squats, toes up/down; also bil shoulder adduction.  Pt demonstrated impulsivity by ambulating in gym with ankle weights on LEs, without instruction to do so.  No LOB.  Instructed pt in HC and HS stretches in standing.  Gait to/from therapy without AD, x 150' with supervision, no LOB.  Dynamic standing balance accepting external perturbations from all directions.  Pt demonstrated ankle strategy, hip strategy on L.  Stepping strategy not elicited.  Treatment 2:  Toilet transfer with supervision; min assist for clothing mgt on L.   Wife present for family ed: simulated car transfer, floor transfer, gait with SPC x 150' including turns, up/down 5 steps R rail, up/down curb with SPC. Pt had LOB from last step to floor, requiring min assist to regain.  Pt performed standing HS and HC stretches with cueing.  Gait on steps and curb with supervision.  Floor transfer with supervision, cues for technique.  Some LTGs downgraded to supervision due to pt's slight impulsivity, L inattention, and LOB transitioning from step > floor.  Aili Casillas 04/12/2013, 10:22 AM

## 2013-04-13 ENCOUNTER — Inpatient Hospital Stay (HOSPITAL_COMMUNITY): Payer: Medicare Other | Admitting: *Deleted

## 2013-04-13 ENCOUNTER — Inpatient Hospital Stay (HOSPITAL_COMMUNITY): Payer: Medicare Other | Admitting: Occupational Therapy

## 2013-04-13 ENCOUNTER — Inpatient Hospital Stay (HOSPITAL_COMMUNITY): Payer: Medicare Other

## 2013-04-13 DIAGNOSIS — I6340 Cerebral infarction due to embolism of unspecified cerebral artery: Secondary | ICD-10-CM

## 2013-04-13 DIAGNOSIS — G8110 Spastic hemiplegia affecting unspecified side: Secondary | ICD-10-CM

## 2013-04-13 MED ORDER — SOLIFENACIN SUCCINATE 10 MG PO TABS: 10.0000 mg | ORAL_TABLET | Freq: Every day | ORAL | Status: DC

## 2013-04-13 MED ORDER — AMLODIPINE BESYLATE 10 MG PO TABS: 10.0000 mg | ORAL_TABLET | Freq: Every day | ORAL | Status: DC

## 2013-04-13 MED ORDER — LISINOPRIL 10 MG PO TABS: 10.0000 mg | ORAL_TABLET | Freq: Two times a day (BID) | ORAL | Status: DC

## 2013-04-13 MED ORDER — RIVAROXABAN 20 MG PO TABS: 20.0000 mg | ORAL_TABLET | Freq: Every day | ORAL | Status: DC

## 2013-04-13 MED ORDER — MIRABEGRON ER 50 MG PO TB24
50.0000 mg | ORAL_TABLET | Freq: Every day | ORAL | Status: DC
  Administered 2013-04-13 – 2013-04-14 (×2): 50 mg via ORAL
  Filled 2013-04-13 (×4): qty 1

## 2013-04-13 NOTE — Progress Notes (Signed)
Social Work Discharge Note  The overall goal for the admission was met for:   Discharge location: Yes - Home with his wife for support  Length of Stay:  Yes - 8 days  Discharge activity level:  Yes - supervision - modified independent  Home/community participation:   Yes  Services provided included: MD, RD, PT, OT, SLP, RN, CM, Pharmacy and Neuropsych  Financial Services:  [facility] Medicare  Follow-up services arranged: Outpatient: [facility] for PT/OT/SLP, DME: tub bench and single point cane  Patient/Family request agency  DME: [facility]  Comments (or additional information):  Pt has information on [facility] support group and private duty list  Patient/Family verbalized understanding of follow-up arrangements: Yes  Individual responsible for coordination of the follow-up plan:  [name] will be coordinating the plan.    Confirmed correct DME delivered: Elvera Lennox 04/13/2013    Merrily Tegeler, [place]

## 2013-04-13 NOTE — Progress Notes (Signed)
Physical Therapy Discharge Summary  Patient Details  Name: Christopher Baker MRN: [id number] Date of Birth: 09-06-1933  Today's Date: 04/13/2013 Time: 1300-1400 Time Calculation (min): 60 min  Patient has met 7 of 8 long term goals due to improved activity tolerance, improved balance, improved postural control, increased strength, improved awareness and improved coordination.  Patient to discharge at an ambulatory level Modified Independent.   Patient's care partner is independent to provide the necessary cognitive assistance at discharge.  Reasons goals not met: Pt still needing supervision level of assist for car transfer, but wife will be able to provide this level of care.   Recommendation:  Patient will benefit from ongoing skilled PT services in outpatient setting to continue to advance safe functional mobility, address ongoing impairments in high-level balance and dynamic gait, and minimize fall risk.  Equipment: single point cane  Reasons for discharge: treatment goals met and discharge from hospital  Patient/family agrees with progress made and goals achieved: Yes  Tx complete this day, including completion of GDI and Berg balance assessments, which were discussed with pt/wife.  Gait in controlled and home settings with S and Mod I, respectively with SPC. Pt needing some reminders to use canes 100% of the time. Also performed floor transfer again with S with technique cues only.  Issued [facility] handout and for heel cord stretch, and performed all with 3# ankle weights.  Wife and pt encouraged to wait until in outpatient therapy before returning to going to driving range.  Pt/wife have no further questions or concerns, pt made Mod I in room until DC tomorrow.  PT Discharge Precautions/Restrictions Precautions Precautions: Fall Restrictions Weight Bearing Restrictions: No Vital Signs Therapy Vitals Temp: 98 F (36.7 C) Temp src: Oral Pulse Rate: 67 Resp: 18 BP: 139/74  mmHg Patient Position, if appropriate: Lying Oxygen Therapy SpO2: 96 % O2 Device: None (Room air) Pain Pain Assessment Pain Assessment: No/denies pain Vision/Perception  Vision - History Baseline Vision: Wears glasses all the time Patient Visual Report: Peripheral vision impairment Vision - Assessment Eye Alignment: Within Functional Limits Additional Comments: Pt has learned to compensate well for field cut with a larger head turn to left. He has demonstrated functional left side awareness. Perception Perception: Within Functional Limits Inattention/Neglect: Appears intact Praxis Praxis: Intact  Cognition Overall Cognitive Status: Impaired/Different from baseline Arousal/Alertness: Awake/alert Orientation Level: Oriented X4 Attention: Sustained Sustained Attention: Appears intact Memory: Impaired Memory Impairment: Decreased recall of new information;Decreased short term memory Decreased Short Term Memory: Verbal basic;Functional basic Awareness: Impaired Awareness Impairment: Intellectual impairment Problem Solving: Impaired Problem Solving Impairment: Verbal complex;Functional complex Safety/Judgment: Impaired Comments: Pt continually forgets to use [facility] Sensation Sensation Light Touch: Appears Intact Coordination Gross Motor Movements are Fluid and Coordinated: Yes Fine Motor Movements are Fluid and Coordinated: Yes Coordination and Movement Description: Coordination improved for bil LE movements and tasks Heel Shin Test: [facility] Motor  Motor Motor: Hemiplegia Motor - Skilled Clinical Observations: UE hemiplegia  Motor - Discharge Observations: Improved strength and coordination in LLE  Mobility Bed Mobility Supine to Sit: 7: Independent Sit to Supine: 7: Independent Transfers Stand Pivot Transfers: 6: Modified independent (Device/Increase time) Locomotion  Ambulation Ambulation: Yes Ambulation/Gait Assistance: 6: Modified independent (Device/Increase  time) Ambulation Distance (Feet): 200 Feet Assistive device: Straight cane Stairs / Additional Locomotion Stairs: Yes Stairs Assistance: 5: Supervision Stairs Assistance Details (indicate cue type and reason): Safety cues for rail Stair Management Technique: One rail Right Number of Stairs: 13 Height of Stairs: 6 Curb: 5:  Supervision Wheelchair Mobility Wheelchair Mobility: No  Trunk/Postural Assessment  Cervical Assessment Cervical Assessment: Within Functional Limits Thoracic Assessment Thoracic Assessment: Within Functional Limits Lumbar Assessment Lumbar Assessment: Within Functional Limits Postural Control Postural Control: Deficits on evaluation  Balance Balance Balance Assessed: Yes Standardized Balance Assessment Standardized Balance Assessment: Berg Balance Test;Dynamic Gait Index Berg Balance Test Sit to Stand: Able to stand without using hands and stabilize independently Standing Unsupported: Able to stand safely 2 minutes Sitting with Back Unsupported but Feet Supported on Floor or Stool: Able to sit safely and securely 2 minutes Stand to Sit: Sits safely with minimal use of hands Transfers: Able to transfer safely, minor use of hands Standing Unsupported with Eyes Closed: Able to stand 10 seconds safely Standing Ubsupported with Feet Together: Able to place feet together independently and stand for 1 minute with supervision From Standing, Reach Forward with Outstretched Arm: Can reach confidently >25 cm (10") From Standing Position, Pick up Object from Floor: Able to pick up shoe safely and easily From Standing Position, Turn to Look Behind Over each Shoulder: Looks behind from both sides and weight shifts well Turn 360 Degrees: Able to turn 360 degrees safely in 4 seconds or less Standing Unsupported, Alternately Place Feet on Step/Stool: Able to stand independently and safely and complete 8 steps in 20 seconds Standing Unsupported, One Foot in Front: Able to  place foot tandem independently and hold 30 seconds Standing on One Leg: Tries to lift leg/unable to hold 3 seconds but remains standing independently Total Score: 52 Dynamic Gait Index Level Surface: Mild Impairment Change in Gait Speed: Mild Impairment Gait with Horizontal Head Turns: Mild Impairment Gait with Vertical Head Turns: Mild Impairment Gait and Pivot Turn: Normal Step Over Obstacle: Normal Step Around Obstacles: Normal Steps: Moderate Impairment Total Score: 18 Extremity Assessment  RUE Assessment RUE Assessment: Within Functional Limits LUE Assessment LUE Assessment: Exceptions to WFL LUE AROM (degrees) Overall AROM Left Upper Extremity: Deficits (See OT assessment, decreased grip and pinch) LUE Strength Grip (lbs): 20 Lateral Pinch: 17 lbs 3 Point Pinch: 5 lbs RLE Assessment RLE Assessment: Within Functional Limits RLE Strength RLE Overall Strength: Within Functional Limits for tasks assessed LLE Assessment LLE Assessment: Within Functional Limits LLE AROM (degrees) Overall AROM Left Lower Extremity: Within functional limits for tasks assessed LLE Strength LLE Overall Strength: Within Functional Limits for tasks assessed  See FIM for current functional status  Clydene Laming, PT, DPT  04/13/2013, 3:55 PM

## 2013-04-13 NOTE — Progress Notes (Signed)
Speech Language Pathology Final Treatment Note & Discharge Summary  Patient Details  Name: Christopher Baker MRN: [id number] Date of Birth: [birth year]  Today's Date: 04/13/2013 Time: 1478-2956 Time Calculation (min): 45 min  Skilled Therapeutic Intervention: Treatment focused on pt/family education with pt's wife present. SLP facilitated session by providing education regarding current cognitive function with handouts provided for memory strategies and medication management. Pt participated in pill box task with Min cues for utilization of external aids and complex problem solving. He required Mod cues for intellectual awareness of cognitive deficits. All questions were answered, and pt/wife verbalized their understanding of the information provided.  Patient has met 3 of 3 long term goals.  Patient to discharge at overall Mod;Min level.  Reasons goals not met: N/A   Clinical Impression/Discharge Summary: Pt has reached 3/3 LTGs in speech therapy during this admission. He requires Min-Mod cues for complex problem solving, recall of information, and intellectual awareness of cognitive deficits. Pt/family education has been completed and pt is scheduled to discharge home with 24/7 assistance from his wife. Pt will benefit from continued OP SLP therapy to maximize functional independence.  Care Partner:  Caregiver Able to Provide Assistance: Yes  Type of Caregiver Assistance: Cognitive  Recommendation:  Outpatient SLP;24 hour supervision/assistance  Rationale for SLP Follow Up: Maximize cognitive function and independence   Equipment: None recommended by SLP   Reasons for discharge: Treatment goals met;Discharged from hospital   Patient/Family Agrees with Progress Made and Goals Achieved: Yes   See FIM for current functional status  [name] 04/13/2013, 9:35 AM  [name], M.A. CCC-SLP [phone]

## 2013-04-13 NOTE — Discharge Summary (Signed)
Discharge summary job # [id number]

## 2013-04-13 NOTE — Progress Notes (Signed)
Occupational Therapy Note  Patient Details  Name: Christopher Baker MRN: [id number] Date of Birth: 1933/06/28 Today's Date: 04/13/2013  Time: 1430-1500  (30 min) Pain: none Individual session   Engaged in functional mobility, therapeutic activities.  Wife, present and attentive to pt's session.  Performed LUE AROM with shoulder flexion, elbow extension, wrist extension.  Explained purpose and benefit.  Did ball catching and used 500 grams ball for grasp and waiter exercise, Did closed chain with isometric control with shoulder at 45 degrees abduction due to old RTC surgery.  Ambulated with [place] back to room with pt looking side to side to compensate for visual inattention/field cut.    Humberto Seals 04/13/2013, 4:38 PM

## 2013-04-13 NOTE — Progress Notes (Signed)
Occupational Therapy Discharge Summary  Patient Details  Name: Christopher Baker MRN: [id number] Date of Birth: 1933/06/26  Today's Date: 04/13/2013  Patient has met 14 of 14 long term goals due to improved activity tolerance, improved balance, postural control, ability to compensate for deficits, functional use of  LEFT upper extremity, improved attention, improved awareness and improved coordination.  Patient to discharge at overall Supervision level with IADLs and mod I with self care.  Patient's care partner is independent to provide the necessary physical and cognitive assistance at discharge.    Reasons goals not met: n/a  Recommendation:  Patient will benefit from ongoing skilled OT services in outpatient setting to continue to advance functional skills in the area of iADL.  Equipment: tub bench, theraputty, theraband  Reasons for discharge: treatment goals met  Patient/family agrees with progress made and goals achieved: Yes  OT Discharge Precautions/Restrictions    ADL  mod I overall with self care except supervision with shaving and tub bench transfer Vision/Perception  Vision - History Patient Visual Report: Peripheral vision impairment Vision - Assessment Eye Alignment: Within Functional Limits Ocular Range of Motion: Restricted on the left;Impaired-to be further tested in functional context Tracking/Visual Pursuits: Decreased smoothness of eye movement to LEFT superior field;Left eye does not track medially;Impaired - to be further tested in functional context Visual Fields: Left visual field deficit Additional Comments: Pt has learned to compensate well for field cut with a larger head turn to left. He has demonstrated functional left side awareness. Perception Inattention/Neglect: Appears intact Praxis Praxis: Intact  Cognition Overall Cognitive Status: Impaired/Different from baseline Arousal/Alertness: Awake/alert Orientation Level: Oriented X4 Attention:  Sustained Sustained Attention: Appears intact Memory: Impaired Memory Impairment: Decreased recall of new information;Decreased short term memory Decreased Short Term Memory: Verbal basic;Functional basic Awareness: Impaired Awareness Impairment: Intellectual impairment Problem Solving: Impaired Problem Solving Impairment: Verbal complex;Functional complex Safety/Judgment: Impaired Sensation Sensation Light Touch: Appears Intact Stereognosis: Impaired by gross assessment Hot/Cold: Appears Intact Proprioception: Impaired by gross assessment Coordination Gross Motor Movements are Fluid and Coordinated: No Fine Motor Movements are Fluid and Coordinated: No Coordination and Movement Description: coordination is improving but pt is not able to tie shoes or open very small containers Motor  Motor Motor: Hemiplegia Mobility    mod I with basic transfers Trunk/Postural Assessment  Cervical Assessment Cervical Assessment: Within Functional Limits Thoracic Assessment Thoracic Assessment: Within Functional Limits Lumbar Assessment Lumbar Assessment: Within Functional Limits Postural Control Postural Control: Deficits on evaluation Protective Responses: reaction to left has improved but needs to improve further to increase overall safety  Balance Static Sitting Balance Static Sitting - Level of Assistance: 7: Independent Static Standing Balance Static Standing - Level of Assistance: 7: Independent Dynamic Standing Balance Dynamic Standing - Level of Assistance: 6: Modified independent (Device/Increase time) Extremity/Trunk Assessment RUE Assessment RUE Assessment: Within Functional Limits LUE Assessment LUE Assessment: Exceptions to WFL LUE AROM (degrees) Overall AROM Left Upper Extremity: Deficits LUE Strength Grip (lbs): 20 Lateral Pinch: 17 lbs 3 Point Pinch: 5 lbs  See FIM for current functional status  SAGUIER,JULIA 04/13/2013, 11:56 AM

## 2013-04-13 NOTE — Progress Notes (Signed)
Occupational Therapy Session Note  Patient Details  Name: Christopher Baker MRN: [id number] Date of Birth: [birth year]  Today's Date: 04/13/2013 Time: 1000-1100 Time Calculation (min): 60 min  Short Term Goals: No short term goals set  Skilled Therapeutic Interventions/Progress Updates:      Pt had completed his toileting, bathing, and dressing with his wife this am and they reported that everything went well. Pt was eager to work on L hand exercises and exercises for B shoulders.  Pt provided with handouts: [facility] activities to do at home, rotator cuff strengthening with theraband, and theraputty exercises. Pt worked on RTC exercises and these were demonstrated to wife. They were both comfortable with the exercises. Pt has been using the theraputty in his room often. He worked on [facility] of fastening nuts and bolts.  Left grip and thumb strength was measured and it has improved dramatically from discharge.  Pt and his wife returned to room at end of session.  Therapy Documentation Precautions:  Precautions Precautions: Fall;Other (comment) Precaution Comments: left field cut and inattention Restrictions Weight Bearing Restrictions: No    Pain: Pain Assessment Pain Assessment: No/denies pain ADL:  See FIM for current functional status  Therapy/Group: Individual Therapy  SAGUIER,JULIA 04/13/2013, 11:46 AM

## 2013-04-13 NOTE — Progress Notes (Signed)
HPI: Christopher Baker is a 79 y.o. right-handed male with history of hypertension,paroxysmal atrial fibrillation with pacemaker. Patient on no anti-coagulation prior to admission secondary to inconsistent appointments and refused with anticoagulation.. Admitted 04/03/2013 with left-sided weakness and facial droop. Cranial CT scan showed right cortical and subcortical infarcts(right caudate head, right parietal lobe). Infarct felt to be embolic secondary to history of PAF. MRI not completed secondary to pacemaker. Carotid Doppler showed bilateral ICA stenosis 40-59%. Echocardiogram with ejection fraction 55% grade 1 diastolic dysfunction no wall motion abnormalities. CT angiogram head negative for posterior circulation without stenosis or major occlusion. Patient did not receive TPA. Neurology service followup recommendations of Xarelto for CVA prophylaxis. Patient is maintained on a regular diet. Patient maintained on chronic prednisone therapy for history of psoriatic arthritis as well as methotrexate. Physical and occupational therapy evaluations completed an ongoing with recommendations for physical medicine rehabilitation consult to consider inpatient rehabilitation services. Patient was felt to be a good candidate for inpatient rehabilitation services and was admitted for comprehensive rehabilitation program   Subjective/Complaints: Up 3-4 times last noc with bladder Objective: Vital Signs: Blood pressure 145/76, pulse 59, temperature 98.5 F (36.9 C), temperature source Oral, resp. rate 19, height 6\' 3"  (1.905 m), weight 103.3 kg (227 lb 11.8 oz), SpO2 93.00%. No results found. No results found for this or any previous visit (from the past 72 hour(s)).   HEENT: normal Cardio: RRR and no murmurs Resp: CTA B/L and unlabored GI: BS positive and non distended Extremity:  Pulses positive and Edema Left great toe  Skin:   Intact and Other erythema Left foot greatest at 1st MTP Neuro: Alert/Oriented,  Abnormal Motor 2-/5 Left delt, bi, tri, grip, 3- Left HF, KE, ADF, Abnormal FMC Ataxic/ dec FMC and Tone  increased Left UE flexor tone, and LLE ext tone, Tone:  hyper reflexia, Inattention and Other Left field cut Musc/Skel:  Extremity tender Left 1st MTP Gen NAD   Assessment/Plan: 1. Functional deficits secondary to Right parietal, caudate infarct Left hemiparesis, left neglect , left field cut which require 3+ hours per day of interdisciplinary therapy in a comprehensive inpatient rehab setting. Physiatrist is providing close team supervision and 24 hour management of active medical problems listed below. Physiatrist and rehab team continue to assess barriers to discharge/monitor patient progress toward functional and medical goals. Plan d/c in am FIM: FIM - Bathing Bathing Steps Patient Completed: Chest;Right Arm;Left Arm;Buttocks;Front perineal area;Left upper leg;Right lower leg (including foot);Abdomen;Right upper leg;Left lower leg (including foot) Bathing: 6: More than reasonable amount of time  FIM - Upper Body Dressing/Undressing Upper body dressing/undressing steps patient completed: Thread/unthread right sleeve of pullover shirt/dresss;Thread/unthread left sleeve of pullover shirt/dress;Put head through opening of pull over shirt/dress;Pull shirt over trunk Upper body dressing/undressing: 5: Supervision: Safety issues/verbal cues FIM - Lower Body Dressing/Undressing Lower body dressing/undressing steps patient completed: Thread/unthread right underwear leg;Thread/unthread right pants leg;Thread/unthread left pants leg;Don/Doff right shoe;Pull underwear up/down;Thread/unthread left underwear leg;Pull pants up/down;Don/Doff left shoe;Don/Doff left sock;Don/Doff right sock;Fasten/unfasten right shoe;Fasten/unfasten left shoe Lower body dressing/undressing: 5: Supervision: Safety issues/verbal cues  FIM - Toileting Toileting steps completed by patient: Adjust clothing prior to  toileting;Performs perineal hygiene;Adjust clothing after toileting Toileting Assistive Devices: Grab bar or rail for support Toileting: 2: Max-Patient completed 1 of 3 steps  FIM - Diplomatic Services operational officer Devices: Grab bars Toilet Transfers: 5-From toilet/BSC: Supervision (verbal cues/safety issues);5-To toilet/BSC: Supervision (verbal cues/safety issues)  FIM - Press photographer Assistive Devices: Arm rests Bed/Chair  Transfer: 5: Chair or W/C > Bed: Supervision (verbal cues/safety issues);5: Bed > Chair or W/C: Supervision (verbal cues/safety issues)  FIM - Locomotion: Wheelchair Locomotion: Wheelchair: 0: Activity did not occur FIM - Locomotion: Ambulation Locomotion: Ambulation Assistive Devices: Emergency planning/management officer Ambulation/Gait Assistance: 5: Supervision Locomotion: Ambulation: 5: Travels 150 ft or more with supervision/safety issues  Comprehension Comprehension Mode: Auditory Comprehension: 5-Follows basic conversation/direction: With extra time/assistive device  Expression Expression Mode: Verbal Expression: 5-Expresses basic needs/ideas: With extra time/assistive device  Social Interaction Social Interaction: 5-Interacts appropriately 90% of the time - Needs monitoring or encouragement for participation or interaction.  Problem Solving Problem Solving: 4-Solves basic 75 - 89% of the time/requires cueing 10 - 24% of the time  Memory Memory: 4-Recognizes or recalls 75 - 89% of the time/requires cueing 10 - 24% of the time    Medical Problem List and Plan:  1. Embolic right parietal and caudate infarct  2. DVT Prophylaxis/Anticoagulation: Continues Xarelto  3. Pain Management: Tylenol as needed  4. Mood: Celexa 20 mg daily. Provide emotional support  5. Neuropsych: This patient is capable of making decisions on his own behalf.  6. Paroxysmal atrial fibrillation/pacemaker. Continue Xarelto. Cardiac rate control.  7. Hypertension.  Norvasc 10 mg daily. Blood pressure 146-159/74-77. increaseACE inhibitor. 8. Hyperlipidemia. Continue Lipitor, Zetia  9. Psoriatic arthritis. Continue prednisone.  weekly methotrexate  10. BPH/TURP. Patient will use his home dose of VESIcare. We'll restart Myrbetriq  11. Adhesive capsulitis right shoulder. No c/os 12. Gout- improved after pred burst, back on home dose LOS (Days) 7 A FACE TO FACE EVALUATION WAS PERFORMED  KIRSTEINS,ANDREW E 04/13/2013, 7:28 AM

## 2013-04-13 NOTE — Discharge Summary (Signed)
Christopher Baker, Christopher Baker               ACCOUNT NO.:  [id number]  MEDICAL RECORD NO.:  [id number]  LOCATION:  4M07C                        FACILITY:  [facility]  PHYSICIAN:  Mariam Dollar, P.A.  DATE OF BIRTH:  22-Dec-1933  DATE OF ADMISSION:  04/06/2013 DATE OF DISCHARGE:  04/14/2013                              DISCHARGE SUMMARY   DISCHARGE DIAGNOSES: 1. Embolic right parietal cerebral vascular accident. 2. Xarelto for deep venous thrombosis prophylaxis. 3. Depression. 4. Paroxysmal atrial fibrillation with pacemaker. 5. Hypertension. 6. Hyperlipidemia. 7. Psoriatic arthritis. 8. Benign prostatic hypertrophy with transurethral resection of the     prostate. 9. Adhesive capsulitis right shoulder. 10.Gout.  HISTORY OF PRESENT ILLNESS:  This is a 79 year old right-handed male with history of hypertension, atrial fibrillation, and pacemaker on no anticoagulation prior to admission secondary to inconsistent appointments and refused anticoagulation.  Admitted on April 03, 2013, with left-sided weakness and facial droop.  Cranial CT scan showed right cortical and subcortical infarctions.  Infarct felt to be embolic secondary to history of PAF.  MRI not completed secondary to pacemaker. Carotid Doppler showed bilateral ICA stenosis 40-59%.  Echocardiogram with ejection fraction of 55%, grade 1 diastolic dysfunction.  No wall motion abnormalities.  CT angiogram of the head negative for posterior circulation without stenosis or major occlusion.  The patient did not receive tPA.  Neurology service followup recommendations of Xarelto for DVT prophylaxis.  The patient was maintained on a regular diet. Maintained on chronic prednisone therapy for history of psoriatic arthritis.  Physical and occupational therapy ongoing.  The patient was admitted for comprehensive rehab program.  PAST MEDICAL HISTORY:  See discharge diagnoses.  SOCIAL HISTORY:  Lives with his wife.  Functional history prior  to admission was independent, working full time.  Functional status upon admission to rehab services, min to mod assist.  REHABILITATION HOSPITAL COURSE:  The patient was admitted to inpatient rehab services with therapies initiated on a 3-hour daily basis consisting of physical therapy, occupational therapy, speech therapy, and rehabilitation nursing.  The following issues were addressed during the patient's rehabilitation stay.  Pertaining to [name]'s embolic right parietal infarction remained stable, maintained on Xarelto.  He will continue with followup of Neurology Services.  The patient maintained on Celexa for history of depression with emotional support provided.  He was attending full therapies.  Cardiac rate remained controlled.  Pacemaker in place.  Advised to continue Xarelto.  Blood pressures monitored on Norvasc 10 mg daily as well as lisinopril titrated to 10 mg twice daily.  There was no orthostatic changes.  The patient would follow up with his primary MD.  He remained on chronic prednisone for history psoriatic arthritis.  He was voiding without difficulty with history of BPH and TURP.  He continued on VESIcare as well as Myrbetriq.  The patient received weekly collaborative interdisciplinary team conferences to discuss estimated length of stay, family teaching, and any barriers to his discharge.  He was still somewhat impulsive with his ambulation needing some cues.  He is ambulating 150 feet with supervision, no loss of balance.  Occupational therapy, the patient was able to apply both shoes on his feet, engage in activities to improve grasp  an strength, he was advised no driving. Full family teaching was completed and plan will be discharged to home with ongoing therapies dictated per rehab services.  DISCHARGE MEDICATIONS:  At the time of dictation included; 1. Norvasc 10 mg p.o. daily. 2. Lipitor 20 mg p.o. daily. 3. Celexa 20 mg p.o. daily. 4. Voltaren  gel as needed. 5. Zetia 10 mg p.o. daily. 6. Niferex 150 mg p.o. b.i.d. 7. Lisinopril 10 mg p.o. b.i.d. 8. Myrbetriq 50 mg p.o. daily. 9. Protonix 40 mg p.o. b.i.d. 10.Prednisone 10 mg p.o. daily. 11.Xarelto 20 mg p.o. daily. 12.Requip 2 mg p.o. daily. 13.VESIcare 10 mg p.o. daily.  DIET:  Regular.  SPECIAL INSTRUCTIONS:  No driving.  The patient would follow up with Dr. Claudette Laws at the outpatient rehab center on May 11, 2013. Dr. Delia Heady in 1 month, Neurology Services.  Dr. Darryll Capers, call for appointment and medical management.     Mariam Dollar, P.A.     DA/MEDQ  D:  04/13/2013  T:  04/13/2013  Job:  [id number]  cc:   Erick Colace, M.D. Pramod P. Pearlean Brownie, MD Stacie Glaze, MD

## 2013-04-14 NOTE — Progress Notes (Signed)
Patient discharge to home today with wife. Left unit at 933 am. Assisted down by Hawa, NT.

## 2013-04-14 NOTE — Progress Notes (Signed)
HPI: Christopher Baker is a 79 y.o. right-handed male with history of hypertension,paroxysmal atrial fibrillation with pacemaker. Patient on no anti-coagulation prior to admission secondary to inconsistent appointments and refused with anticoagulation.. Admitted 04/03/2013 with left-sided weakness and facial droop. Cranial CT scan showed right cortical and subcortical infarcts(right caudate head, right parietal lobe). Infarct felt to be embolic secondary to history of PAF. MRI not completed secondary to pacemaker. Carotid Doppler showed bilateral ICA stenosis 40-59%. Echocardiogram with ejection fraction 55% grade 1 diastolic dysfunction no wall motion abnormalities. CT angiogram head negative for posterior circulation without stenosis or major occlusion. Patient did not receive TPA. Neurology service followup recommendations of Xarelto for CVA prophylaxis. Patient is maintained on a regular diet. Patient maintained on chronic prednisone therapy for history of psoriatic arthritis as well as methotrexate. Physical and occupational therapy evaluations completed an ongoing with recommendations for physical medicine rehabilitation consult to consider inpatient rehabilitation services. Patient was felt to be a good candidate for inpatient rehabilitation services and was admitted for comprehensive rehabilitation program   Subjective/Complaints: No issues overnight. Bladder not overactive. Discussed blood pressure her parameters with wife and patient. Objective: Vital Signs: Blood pressure 146/70, pulse 63, temperature 97.3 F (36.3 C), temperature source Oral, resp. rate 17, height 6\' 3"  (1.905 m), weight 103.3 kg (227 lb 11.8 oz), SpO2 96.00%. No results found. No results found for this or any previous visit (from the past 72 hour(s)).   HEENT: normal Cardio: RRR and no murmurs Resp: CTA B/L and unlabored GI: BS positive and non distended Extremity:  Pulses positive and Edema Left great toe  Skin:   Intact  and Other erythema Left foot greatest at 1st MTP Neuro: Alert/Oriented, Abnormal Motor 3-/5 Left delt, bi, tri, grip, 4- Left HF, KE, ADF, Abnormal FMC Ataxic/ dec FMC and Tone  increased Left UE flexor tone, and LLE ext tone, Tone:  hyper reflexia, Inattention and no evidence Left field cut with confrontation Musc/Skel:  Extremity tender Left 1st MTP Gen NAD   Assessment/Plan: 1. Functional deficits secondary to Right parietal, caudate infarct Left hemiparesis, left neglect , left field cut which require 3+ hours per day of interdisciplinary therapy in a comprehensive inpatient rehab setting. Stable for D/C today F/u PCP in 1-2 weeks F/u PM&R 3 weeks See D/C summary See D/C instructions No driving  No work No EtOH FIM: FIM - Bathing Bathing Steps Patient Completed: Chest;Right Arm;Left Arm;Buttocks;Front perineal area;Left upper leg;Right lower leg (including foot);Abdomen;Right upper leg;Left lower leg (including foot) Bathing: 6: More than reasonable amount of time  FIM - Upper Body Dressing/Undressing Upper body dressing/undressing steps patient completed: Thread/unthread right sleeve of pullover shirt/dresss;Thread/unthread left sleeve of pullover shirt/dress;Put head through opening of pull over shirt/dress;Pull shirt over trunk Upper body dressing/undressing: 7: Complete Independence: No helper FIM - Lower Body Dressing/Undressing Lower body dressing/undressing steps patient completed: Thread/unthread right underwear leg;Thread/unthread right pants leg;Thread/unthread left pants leg;Don/Doff right shoe;Pull underwear up/down;Thread/unthread left underwear leg;Pull pants up/down;Don/Doff left shoe;Don/Doff left sock;Don/Doff right sock;Fasten/unfasten right shoe;Fasten/unfasten left shoe Lower body dressing/undressing: 7: Complete Independence: No helper  FIM - Toileting Toileting steps completed by patient: Adjust clothing prior to toileting;Performs perineal hygiene;Adjust  clothing after toileting Toileting Assistive Devices: Grab bar or rail for support Toileting: 6: More than reasonable amount of time  FIM - Diplomatic Services operational officer Devices: Grab bars Toilet Transfers: 6-More than reasonable amt of time  FIM - Banker Devices: Arm rests Bed/Chair Transfer: 7: Supine > Sit:  No assist;7: Sit > Supine: No assist;6: Bed > Chair or W/C: No assist;6: Chair or W/C > Bed: No assist  FIM - Locomotion: Wheelchair Locomotion: Wheelchair: 0: Activity did not occur FIM - Locomotion: Ambulation Locomotion: Ambulation Assistive Devices: Emergency planning/management officer Ambulation/Gait Assistance: 6: Modified independent (Device/Increase time) Locomotion: Ambulation: 6: Travels 150 ft or more with assistive device/no helper  Comprehension Comprehension Mode: Auditory Comprehension: 5-Follows basic conversation/direction: With no assist  Expression Expression Mode: Verbal Expression: 5-Expresses basic needs/ideas: With extra time/assistive device  Social Interaction Social Interaction: 5-Interacts appropriately 90% of the time - Needs monitoring or encouragement for participation or interaction.  Problem Solving Problem Solving: 4-Solves basic 75 - 89% of the time/requires cueing 10 - 24% of the time  Memory Memory: 4-Recognizes or recalls 75 - 89% of the time/requires cueing 10 - 24% of the time    Medical Problem List and Plan:  1. Embolic right parietal and caudate infarct  2. DVT Prophylaxis/Anticoagulation: Continues Xarelto  3. Pain Management: Tylenol as needed  4. Mood: Celexa 20 mg daily. Provide emotional support  5. Neuropsych: This patient is capable of making decisions on his own behalf.  6. Paroxysmal atrial fibrillation/pacemaker. Continue Xarelto. Cardiac rate control.  7. Hypertension. Norvasc 10 mg daily. Blood pressure 146-159/74-77. increaseACE inhibitor. 8. Hyperlipidemia. Continue Lipitor,  Zetia  9. Psoriatic arthritis. Continue prednisone.  weekly methotrexate  10. BPH/TURP. Patient will use his home dose of VESIcare. We'll restart Myrbetriq  11. Adhesive capsulitis right shoulder. No c/os 12. Gout- improved after pred burst, back on home dose LOS (Days) 8 A FACE TO FACE EVALUATION WAS PERFORMED  KIRSTEINS,ANDREW E 04/14/2013, 9:16 AM

## 2013-04-16 NOTE — Consult Note (Signed)
NEUROCOGNITIVE TESTING - [facility]   Mr. Demarcus Thielke is a 79 year old, right-handed man, who was seen for a brief neurocognitive evaluation to assess cognitive and emotional functioning post-stroke.  According to his medical record, Mr. Baba was admitted on 04/03/2013 with left-sided weakness and facial droop.  Cranial CT demonstrated right cortical and subcortical infarcts (right caudate head and right parietal lobe).  MRI was not completed because of pacemaker.  Of note, his history is also significant for low B12 for which he has been on twice monthly injections for several months.    PROCEDURES: [3 units of 40981 on 04/12/2013]  The following tests were performed during today's visit: Mini Mental Status Examination (brief form), Repeatable Battery for the Assessment of Neuropsychological Status (RBANS, form A), Test of Practical Judgment, Geriatric Depression Scale (short form), and the Geriatric Anxiety Inventory.  Performance validity measures were also administered.  Test results are as follows:   MMSE -2 brief Raw Score = 14/16 Description = WNL   RBANS Indices Scaled Score Percentile Description  Immediate Memory  61 < 1 Profoundly Impaired  Visuospatial/Constructional 96 39 Average  Language 82 12 Below Average  Attention 85 16 Below Average  Delayed Memory 68 2 Profoundly Impaired  Total Score 73 4 Impaired   RBANS Subtests Raw Score Percentile Description  List Learning 15 1 Profoundly Impaired  Story Memory 8 < 1 Profoundly Impaired  Figure Copy 19 75 Average  Line Orientation 14 19 Below Average  Picture Naming 10 70 Average  Semantic Fluency 9 2 Profoundly Impaired  Digit Span 12 73 Average  Coding 18 < 1 Profoundly Impaired  List Recall 1 5 Impaired  List Recognition 14 < 1 Profoundly Impaired  Story Recall 4 1 Profoundly Impaired  Figure recall 11 34 Average   TOP-J Z Score = -0.83 Description = Below Average    GDS-short Raw Score = 2 Description = WNL   GAI Raw Score = 3 Description = WNL   Test results revealed intact mental status, but impairments in several domains, namely in memory and processing speed.  Of note, his visual memory was intact and representative of a relative area of strength.  On a measure designed to evaluate his ability to make sound judgments in everyday situations, Mr. Delpizzo's total score was in the below average range.  While he was able to come up with acceptable solutions to all problems, he demonstrated a tendency to be incomplete in his ability to formulate a detailed plan for resolution.  Mr. Michalsky's responses to self-report measures of mood symptoms were not suggestive of the presence of clinically significant depression or anxiety at this time.    IMPRESSIONS:  Mr. Cottingham overall cognitive functioning was indicative of changes, particularly in memory and processing speed, which are likely secondary to stroke.  Although he has a history of low B12, he has been treated for this for several months and as such, it is not likely significantly contributing to his current cognitive disruption.  Regarding ability to make sound judgments, his score was below average, indicating that he will likely not be susceptible to undue influence or make markedly poor decisions, but he may not be able to implement solutions and should leave that to others at this time.  At this time there does not appear to be significant mood disruption that is impacting his cognitive abilities.   In light of these findings, the following recommendations are provided and were discussed with Mr. Mannor and  his wife.  Significant time was also spent educating them about cognitive recovery in stroke.      RECOMMENDATIONS:  Recommendations for treatment team:     When interacting with Mr. Disney, directions and information should be provided in a simple, straight forward manner, and the treatment team  should avoid giving multiple instructions simultaneously.    Given his strength in visual memory, Mr. Marian may find benefit in writing down and visually studying important information.     Mr. Shomaker may also benefit from being provided with multiple trials to learn new skills given the noted memory inefficiencies.    Mr. Hayward requires more time than typical to process information. The treatment team may benefit from waiting for a verbal response to information before presenting    To the extent possible, multitasking should be avoided.   Performance will generally be best in a structured, routine, and familiar environment, as opposed to situations involving complex problems.   Recommendations for discharge planning:     Designate short, pre-determined periods of time (e.g. 30 minutes twice weekly) to answer co-worker's questions, unless emergencies require more immediate action.  This will hopefully serve to minimize stress to allow more focus on physical and cognitive recovery.    Complete a comprehensive neuropsychological evaluation in 2-3 months.  Per request, contact information for Dr. Wylene Simmer should be provided in his discharge paperwork for this purpose.     Maintain engagement in mentally, physically and cognitively stimulating activities.    Strive to maintain a healthy lifestyle (e.g., proper diet and exercise) in order to promote physical, cognitive and emotional health.   Leavy Cella, Psy.D.  Clinical Neuropsychologist

## 2013-04-17 ENCOUNTER — Ambulatory Visit: Payer: Medicare Other | Admitting: Speech Pathology

## 2013-04-17 ENCOUNTER — Ambulatory Visit: Payer: Medicare Other | Admitting: Occupational Therapy

## 2013-04-17 ENCOUNTER — Ambulatory Visit: Payer: Medicare Other | Attending: Physical Medicine & Rehabilitation

## 2013-04-17 DIAGNOSIS — R279 Unspecified lack of coordination: Secondary | ICD-10-CM | POA: Insufficient documentation

## 2013-04-17 DIAGNOSIS — R41841 Cognitive communication deficit: Secondary | ICD-10-CM | POA: Insufficient documentation

## 2013-04-17 DIAGNOSIS — Z5189 Encounter for other specified aftercare: Secondary | ICD-10-CM | POA: Insufficient documentation

## 2013-04-17 DIAGNOSIS — I69928 Other speech and language deficits following unspecified cerebrovascular disease: Secondary | ICD-10-CM | POA: Insufficient documentation

## 2013-04-18 ENCOUNTER — Ambulatory Visit: Payer: Medicare Other | Admitting: Occupational Therapy

## 2013-04-18 ENCOUNTER — Ambulatory Visit: Payer: Medicare Other

## 2013-04-18 NOTE — Discharge Summary (Signed)
Physician Discharge Summary  Christopher Baker WJX:[id number] DOB: 12/06/1933 DOA: 04/03/2013  PCP: Carrie Mew, MD  Admit date: 04/03/2013 Discharge date: 04/18/2013  Time spent: > 35 minutes  Recommendations for Outpatient Follow-up:  1. Please see recommendations made by neurology listed below.  Discharge Diagnoses:  Principal Problem:   CVA (cerebral infarction) Active Problems:   HYPERLIPIDEMIA   HYPERTENSION   FIBRILLATION, ATRIAL   EXTRINSIC ASTHMA, WITH EXACERBATION   GERD   Discharge Condition: Stable  Diet recommendation: Heart healthy  Filed Weights   04/03/13 1234  Weight: 102.286 kg (225 lb 8 oz)    History of present illness:  Based on stroke teams notes: [name] is a 79 y.o. male presenting with left hemiparesis-numbness, left face weakness. Imaging confirms a right cortical and subcortical infarcts (right caudate head, right parietal lobe). Infarct felt to be embolic secondary to hx PAF (not on anticoagulation per Dr. Marlis Edelson conversation with Dr. Ladona Ridgel due to inconsistent appts, refusal of anticoagulation). On no documented antithrombotics prior to admission. Now on xarelto for secondary stroke prevention. Patient with resultant left hemiparesis. Work up completed  Hospital Course:  1-Left side weakness and left facial droop: with stroke risk factors significant for HTN, HLD, PAF  -Patient out of therapeutic window for TPA  Per Neurology recommendations: TREATMENT/PLAN  Continue xarelto for secondary stroke prevention.  Recommend VVS evaluate current doppler reports and compare with previously done reports, either as an IP or OP. If increased in right ICA stenosis, may need to consider CEA post rehab  Rehab when bed available and insurance approval obtained. Patient has a 10-15% risk of having another stroke over the next year, the highest risk is within 2 weeks of the most recent stroke/TIA (risk of having a stroke following a stroke or  TIA is the same).  Ongoing risk factor control by Primary Care Physician  Stroke Service will sign off. Please call should any needs arise.  Follow up with Dr. Pearlean Brownie, Stroke Clinic, in 2 months.  - Xarelto to be initiated per cardiology and neurology notes.  - Cardiology recommended close follow up once discharged  2-HYPERLIPIDEMIA:  - Continue statins on discharge  3-HYPERTENSION:  - continue amlodipine on discharge, will need continued monitoring and adjustment for 2ary stroke prevention as outpatient.  4-Paroxysmal ATRIAL fibrillation: s/p pacemaker. Not using any anticoagulation.  - Cardiology consulted evaluated and recommended the following: 1. Stroke  2. S/p PPM with rare (<0.1%) atrial fib on PPM interogation  3. H/o non-compliance, not thought to be a good candidate for coumadin  4. H/o easy bruisibility, patient not previously inclined to pursue anti-coag.  Rec: agree with plans for Xarelto. He will need close followup. He is pending rehab. He has some neglect of left hand. Hopefully this will improve.    5-EXTRINSIC ASTHMA, WITH EXACERBATION:  - stable   6-GERD:  -Stable, continue PPI   7-psoriatic arthritis: The patient actively receiving methotrexate and prednisone as an outpatient.  -Will continue home regimen stable  Procedures:  None  Consultations:  Cardiology  Neurology  Discharge Exam: Filed Vitals:   04/06/13 1128  BP: 160/80  Pulse: 66  Temp: 97.8 F (36.6 C)  Resp: 20    General: Pt in NAD Cardiovascular: RRR, no MRG Respiratory: No increased work of breathing  Discharge Instructions  Discharge Orders   Future Appointments Provider Department Dept Phone   04/24/2013 8:00 AM Ellen Henri, CCC-SLP [facility] [phone]   04/24/2013 8:45 AM Victorino Dike  D Alderson, PT [facility] [phone]   04/24/2013 9:30 AM Trixie Deis [facility] [phone]   04/24/2013 3:00 PM Baker Pierini, FNP [facility] [phone]   04/26/2013 8:00 AM Mena Pauls, PT [facility] [phone]   04/26/2013 8:45 AM Ellen Henri, CCC-SLP [facility] [phone]   04/26/2013 9:30 AM Trixie Deis [facility] [phone]   04/27/2013 8:45 AM Colonel Bald, OT [facility] [phone]   04/30/2013 8:00 AM Barron Alvine, CCC-SLP [facility] [phone]   04/30/2013 8:45 AM Mena Pauls, PT [facility] [phone]   04/30/2013 9:30 AM Trixie Deis [facility] [phone]   05/02/2013 8:45 AM Ellen Henri, CCC-SLP [facility] [phone]   05/02/2013 9:30 AM Sallyanne Kuster, [place] [facility] [phone]   05/02/2013 10:15 AM Trixie Deis [facility] [phone]   05/03/2013 10:15 AM Kelli Churn [facility] [phone]   05/08/2013 8:00 AM Trixie Deis [facility] [phone]   05/08/2013 8:45 AM Sallyanne Kuster, PTA [facility] [phone]   05/08/2013 9:30 AM Ellen Henri, CCC-SLP [facility] [phone]   05/09/2013 11:00 AM Colonel Bald, OT [facility] [phone]   05/10/2013 8:00 AM Ellen Henri, CCC-SLP [facility] [phone]    05/10/2013 8:45 AM Trixie Deis [facility] [phone]   05/10/2013 9:30 AM Sallyanne Kuster, [place] [facility] [phone]   05/11/2013 1:15 PM Erick Colace, [facility] [phone]   05/14/2013 12:30 PM Mena Pauls, PT [facility] [phone]   05/14/2013 1:15 PM Trixie Deis [facility] [phone]   05/14/2013 2:00 PM Katherene Ponto [facility] [phone]   05/15/2013 10:15 AM Trixie Deis [facility] [phone]   05/16/2013 11:00 AM Cira Servant, DO [facility] Neurology [facility] [phone]   05/16/2013 1:15 PM Mena Pauls, PT [facility] [phone]   05/16/2013 2:00 PM Nona Dell [facility] [phone]   05/16/2013 2:45 PM Colonel Bald, [place] [facility] [phone]   05/21/2013 9:45 AM [facility] [phone]   06/04/2013 2:00 PM Stacie Glaze, MD [facility] [phone]   11/27/2013 3:00 PM [facility] [address] [phone]   11/27/2013 4:00 PM Larina Earthly, MD [facility] [phone]   12/27/2013 9:00 AM Adam Gus Rankin, DO [facility] Neurology [place] [phone]   Future Orders Complete By Expires   Diet - low sodium heart healthy  As directed    Increase activity slowly  As directed        Medication List    STOP taking these medications       amLODipine 5 MG tablet  Commonly known as:  NORVASC     nabumetone 750 MG tablet  Commonly known as:  RELAFEN     OXYBUTYNIN CHLORIDE PO      VESICARE 10 MG tablet  Generic drug:  solifenacin      TAKE these medications       atorvastatin 20 MG tablet  Commonly known as:  LIPITOR  Take 20 mg by mouth daily. TAKE ONE TABLET BY  MOUTH DAILY     citalopram 20 MG tablet  Commonly known as:  CELEXA  Take 1 tablet (20 mg total) by mouth daily.     clotrimazole-betamethasone cream  Commonly known as:  LOTRISONE  Apply 1 application topically 2 (two) times daily. For psoriasis.     Cyanocobalamin 500 MCG/0.1ML Soln  Place 500 mcg into the nose once a week.     diclofenac sodium 1 % Gel  Commonly known as:  VOLTAREN  Apply 1 application topically 2 (two) times daily as needed. Arthritis in both thumbs.     esomeprazole 40 MG capsule  Commonly known as:  NEXIUM  Take 1 capsule (40 mg total) by mouth 2 (two) times daily.     ezetimibe 10 MG tablet  Commonly known as:  ZETIA  Take 10 mg by mouth daily.     iron polysaccharides 150 MG capsule  Commonly known as:  NIFEREX  Take 150 mg by mouth 2 (two) times daily.     methotrexate 2.5 MG tablet  Commonly known as:  RHEUMATREX  TAKE FOUR TABLETS BY MOUTH ONCE A WEEK.     MYRBETRIQ 50 MG Tb24 tablet  Generic drug:  mirabegron ER  Take 50 mg by mouth daily.     predniSONE 5 MG tablet  Commonly known as:  DELTASONE  Take 5 mg by mouth daily.     rOPINIRole 2 MG tablet  Commonly known as:  REQUIP  Take 2 mg by mouth 2 (two) times daily.     triamcinolone cream 0.1 %  Commonly known as:  KENALOG  APPLY TO AFFECTED AREAS AS DIRECTED       Allergies  Allergen Reactions  . Ciprofloxacin Nausea Only    REACTION: GI upset  . Other     SCOLLOPS--SUDDEN GI ATTACK       Follow-up Information   Schedule an appointment as soon as possible for a visit with Gates Rigg, MD. (stroke clinic)    Specialties:  Neurology, Radiology   Contact information:   [address] [place] [address] [phone]        The results of significant  diagnostics from this hospitalization (including imaging, microbiology, ancillary and laboratory) are listed below for reference.    Significant Diagnostic Studies: Ct Angio Head W/cm &/or Wo Cm  04/04/2013   CLINICAL DATA:  79 year old male with recent right basal ganglia infarct. Presented with left-sided weakness. Subsequent encounter.  EXAM: CT ANGIOGRAPHY HEAD  TECHNIQUE: Multidetector CT imaging of the head was performed using the standard protocol during bolus administration of intravenous contrast. Multiplanar CT image reconstructions including MIPs were obtained to evaluate the vascular anatomy.  CONTRAST:  50mL OMNIPAQUE IOHEXOL 350 MG/ML SOLN  COMPARISON:  Intracranial MRA 01/15/2010, 11/05/2006.  FINDINGS: Left chest cardiac pacemaker on scout views.  Stable cytotoxic edema in the right caudate and anterior lentiform nuclei. No associated enhancement. Stable mild mass effect on the right frontal horn. No ventriculomegaly. Also, the small focus of cortical or subcortical white matter edema in the right parietal lobe is confirmed (series 8, image 21). No mass effect at that site. Stable and normal right hemisphere gray-white matter differentiation elsewhere. No acute intracranial hemorrhage identified. No midline shift, mass effect, or evidence of intracranial mass lesion. No abnormal enhancement identified.  Stable visualized osseous structures. Stable paranasal sinuses and mastoids. Stable orbit and scalp soft tissues.  VASCULAR FINDINGS:  Major intracranial venous structures are enhancing.  Patent distal vertebral arteries, the left  is dominant and the right functionally terminates in PICA. Normal left PICA origin. Patent basilar artery remarkable for a AE mid basilar fenestration. No stenosis. SCA and PCA origins are within normal limits. Normal right posterior communicating artery, the left is diminutive or absent. Bilateral PCA branches are within normal limits.  Negative distal cervical ICAs.  Bilateral ICA siphon calcified atherosclerosis. No hemodynamically significant stenosis results. Ophthalmic artery origins are within normal limits. Normal right posterior communicating artery origin. Normal carotid termini, MCA and ACA origins. Mildly dominant left ACA A1 segment. Normal anterior communicating artery and visualized ACA branches. Normal left MCA branches.  Right MCA M1 segment is patent and within normal limits. Right MCA bifurcation is patent the and within normal limits. No right MCA branch occlusion identified.  Review of the MIP images confirms the above findings.  IMPRESSION: 1. Stable right MCA territory infarcts with cytotoxic edema in the right basal ganglia and involving a small area of the right parietal lobe. No associated mass effect or hemorrhage.  2. No right MCA stenosis or major branch occlusion identified. Negative anterior circulation except for bilateral ICA siphon calcified plaque without hemodynamically significant stenosis.  3. Negative posterior circulation.   Electronically Signed   By: Augusto Gamble M.D.   On: 04/04/2013 18:18   Dg Chest 2 View  04/03/2013   CLINICAL DATA:  Stroke. Weakness. Hypertension.  EXAM: CHEST  2 VIEW  COMPARISON:  None.  FINDINGS: The heart size and mediastinal contours are within normal limits. Both lungs are clear. No pleural effusion or pneumothorax. Left anterior chest wall sequential pacemaker is stable in well positioned. The bony thorax is demineralized but intact.  IMPRESSION: No active cardiopulmonary disease.   Electronically Signed   By: Amie Portland M.D.   On: 04/03/2013 13:32   Ct Head Wo Contrast  04/04/2013   CLINICAL DATA:  Followup.  EXAM: CT HEAD WITHOUT CONTRAST  TECHNIQUE: Contiguous axial images were obtained from the base of the skull through the vertex without intravenous contrast.  COMPARISON:  04/03/2013  FINDINGS: No acute osseous findings. Mild inflammatory mucosal thickening in the paranasal sinuses. Opacified,  expanded air cell in the posterior left mastoid region.  Bilateral proptosis related to increase in retro-orbital fat.  Newly seen low-attenuation in the right caudate head, anterior right putamen, and intervening internal capsule. There is new subcortical low-attenuation in the right parietal region, 2 cm in maximal span. No contralateral ischemia detected. No hemorrhagic complication.  Critical Value/emergent results were called by telephone at the time of interpretation on 04/04/2013 at 6:39 AM to [facility] Schorr, who verbally acknowledged these results.  IMPRESSION: 1. Newly evident infarct of the right caudate head and anterior putamen. Likely 2nd focus of acute infarction involving the subcortical right parietal lobe. Multi focal, unilateral infarcts would favor thromboembolic disease from the right carotid system. 2. No complicating hemorrhage.   Electronically Signed   By: Tiburcio Pea M.D.   On: 04/04/2013 06:49   Ct Head Wo Contrast  04/03/2013   CLINICAL DATA:  Left-sided weakness.  EXAM: CT HEAD WITHOUT CONTRAST  TECHNIQUE: Contiguous axial images were obtained from the base of the skull through the vertex without intravenous contrast.  COMPARISON:  09/10/2010 head CT.  FINDINGS: No intracranial hemorrhage.  Small vessel disease type changes without CT evidence of large acute infarct. Streak artifact limits evaluation of the posterior fossa.  No hydrocephalus.  No intracranial mass lesion noted on this unenhanced exam.  Vascular calcifications. Minimal to mild paranasal sinus  mucosal thickening.  Mild exophthalmos.  IMPRESSION: No intracranial hemorrhage.  Small vessel disease type changes without CT evidence of large acute infarct. Streak artifact limits evaluation of the posterior fossa.   Electronically Signed   By: Bridgett Larsson M.D.   On: 04/03/2013 09:39    Microbiology: No results found for this or any previous visit (from the past 240 hour(s)).   Labs: Basic Metabolic Panel: No results  found for this basename: NA, K, CL, CO2, GLUCOSE, BUN, CREATININE, CALCIUM, MG, PHOS,  in the last 168 hours Liver Function Tests: No results found for this basename: AST, ALT, ALKPHOS, BILITOT, PROT, ALBUMIN,  in the last 168 hours No results found for this basename: LIPASE, AMYLASE,  in the last 168 hours No results found for this basename: AMMONIA,  in the last 168 hours CBC: No results found for this basename: WBC, NEUTROABS, HGB, HCT, MCV, PLT,  in the last 168 hours Cardiac Enzymes: No results found for this basename: CKTOTAL, CKMB, CKMBINDEX, TROPONINI,  in the last 168 hours BNP: BNP (last 3 results) No results found for this basename: PROBNP,  in the last 8760 hours CBG: No results found for this basename: GLUCAP,  in the last 168 hours     Signed:  Penny Pia  [facility] 04/18/2013, 7:25 PM

## 2013-04-24 ENCOUNTER — Ambulatory Visit: Payer: Medicare Other | Attending: Physical Medicine & Rehabilitation

## 2013-04-24 ENCOUNTER — Ambulatory Visit: Payer: Medicare Other | Admitting: Occupational Therapy

## 2013-04-24 ENCOUNTER — Ambulatory Visit (INDEPENDENT_AMBULATORY_CARE_PROVIDER_SITE_OTHER): Payer: Medicare Other | Admitting: Family

## 2013-04-24 ENCOUNTER — Ambulatory Visit: Payer: Medicare Other | Admitting: Speech Pathology

## 2013-04-24 ENCOUNTER — Encounter: Payer: Self-pay | Admitting: Family

## 2013-04-24 VITALS — BP 120/64 | HR 85 | Wt 229.0 lb

## 2013-04-24 DIAGNOSIS — R279 Unspecified lack of coordination: Secondary | ICD-10-CM | POA: Insufficient documentation

## 2013-04-24 DIAGNOSIS — R41841 Cognitive communication deficit: Secondary | ICD-10-CM | POA: Insufficient documentation

## 2013-04-24 DIAGNOSIS — I6350 Cerebral infarction due to unspecified occlusion or stenosis of unspecified cerebral artery: Secondary | ICD-10-CM

## 2013-04-24 DIAGNOSIS — I10 Essential (primary) hypertension: Secondary | ICD-10-CM

## 2013-04-24 DIAGNOSIS — I69928 Other speech and language deficits following unspecified cerebrovascular disease: Secondary | ICD-10-CM | POA: Insufficient documentation

## 2013-04-24 DIAGNOSIS — E785 Hyperlipidemia, unspecified: Secondary | ICD-10-CM

## 2013-04-24 DIAGNOSIS — I4891 Unspecified atrial fibrillation: Secondary | ICD-10-CM

## 2013-04-24 DIAGNOSIS — Z5189 Encounter for other specified aftercare: Secondary | ICD-10-CM | POA: Insufficient documentation

## 2013-04-24 DIAGNOSIS — I639 Cerebral infarction, unspecified: Secondary | ICD-10-CM

## 2013-04-24 DIAGNOSIS — G2581 Restless legs syndrome: Secondary | ICD-10-CM

## 2013-04-24 LAB — COMPREHENSIVE METABOLIC PANEL
ALT: 25 U/L (ref 0–53)
AST: 20 U/L (ref 0–37)
Alkaline Phosphatase: 83 U/L (ref 39–117)
Creatinine, Ser: 1.3 mg/dL (ref 0.4–1.5)
Glucose, Bld: 113 mg/dL — ABNORMAL HIGH (ref 70–99)
Potassium: 5.3 mEq/L — ABNORMAL HIGH (ref 3.5–5.1)
Sodium: 140 mEq/L (ref 135–145)
Total Bilirubin: 0.5 mg/dL (ref 0.3–1.2)
Total Protein: 6.3 g/dL (ref 6.0–8.3)

## 2013-04-24 MED ORDER — ROPINIROLE HCL 2 MG PO TABS: 2.0000 mg | ORAL_TABLET | Freq: Two times a day (BID) | ORAL | Status: DC

## 2013-04-24 NOTE — Progress Notes (Signed)
Subjective:    Patient ID: Christopher Baker, male    DOB: 27-[name]-1934, 79 y.o.   MRN: [id number]  HPI A 79 year old white male, nonsmoker, patient of Dr. Lovell Sheehan is in as a post hospital followup from 04/03/2013. Patient presented to the emergency department with left-sided weakness and facial drooping. Symptoms were present upon arising that morning. He was discharged to a rehabilitation facility on 04/06/2013. He has a history of atrial fibrillation, hyperlipidemia, hypertension, and has a pacemaker. In the past, he has been resistant to anticoagulation therapy. However today, he is currently on Xarelto. He is also under the care of neurology. He had carotid Doppler studies that showed a 40-59% blockage of the carotid arteries bilaterally. Overall, he is still a much better. He's been in outpatient rehabilitation for physical therapy and occupational therapy. Wife reports he has a delay in processing information but overall is back to normal. She has occasional concerns of lightheadedness during therapy. The pressure medication was changed to Norvasc 10 mg once daily. She's concerned that his blood pressure may be dropping too low. Blood pressures between 120-140 systolically over 60s to 70s.  Has been having increased restless leg issues. He currently takes Requip 2 mg a day. He is requesting to be able to take it twice a day.   Review of Systems  Constitutional: Negative.   HENT: Negative.   Respiratory: Negative.   Cardiovascular: Negative.   Gastrointestinal: Negative.   Endocrine: Negative.   Genitourinary: Negative.   Musculoskeletal: Negative.   Skin: Positive for color change.  Neurological: Positive for facial asymmetry. Negative for dizziness.       Left-sided residual weakness.  Psychiatric/Behavioral: Negative.    Past Medical History  Diagnosis Date  . Hyperlipidemia   . HTN (hypertension)   . Status post placement of cardiac pacemaker DDD-  06-04-10-- LAST CHECK 09-08-10 IN  EPIC    SECONDARY TO SYNCOPY AND BRADYCARDIA  . Restless leg syndrome   . Normal nuclear stress test 2008    LOW RISK--   DR  WALL  . Carotid stenosis, bilateral PER DR EARLY / DUPLEX  09-09-10      40 - 59%   BILATERALLY--- ASYMPTOMATIC  . History of echocardiogram 11-21-2006    EF 65%  . History of prostate cancer S/P RADIATION  TX  6 YRS AGO  . Psoriasis   . Arthritis with psoriasis   . GERD (gastroesophageal reflux disease)     CONTROLLED W/ PROTONIX  . Borderline diabetes     DIET CONTROLLED - PT STATES HE IS NOT DIABETIC  . Lumbar spondylosis W/ RADICULOPATHY  . Iron deficiency   . Cataract immature LEFT EYE  . PAF (paroxysmal atrial fibrillation)   . SVT (supraventricular tachycardia)     S/P ABLATION   2008  . Coronary artery disease CARDIOLOGIST- DR Sharrell Ku    09-08-10 VISIT AND PACEMAKER CHECK  IN EPIC  . Cancer     HX OF PROSTATE CANCER--TX'D WITH RADIATION  . Sleep apnea TESTED YRS AGO-- NO CPAP RX GIVEN    PT STATES HE DOES NOT THINK HE STILL HAS SLEEP APNEA--DOES NOT USE CPAP    History   Social History  . Marital Status: Married    Spouse Name: N/A    Number of Children: N/A  . Years of Education: N/A   Occupational History  . retired    Social History Main Topics  . Smoking status: Former Smoker -- 0.10 packs/day for 55 years  Types: Cigarettes  . Smokeless tobacco: Never Used     Comment: pt statest he smokes about "once a month"   . Alcohol Use: Yes     Comment: RARE  . Drug Use: No  . Sexual Activity: Yes   Other Topics Concern  . Not on file   Social History Narrative  . No narrative on file    Past Surgical History  Procedure Laterality Date  . Replacement total knee  1997    RIGHT  . Cardiac pacemaker placement  06-04-10    DDD/ AND REMOVAL LOOR RECORDER  . Loop recorder placement  01-30-10  . Lumbar laminectomy/ diskectomy/ fusion  05-19-10    L4 - 5  . Cholecystectomy  2009  . Cardiac electrophysiology study and  ablation  2008    FOR SVT  . Prostate biopsy  2007  . Transurethral resection of prostate  2006  . Inguinal hernia repair  2005    LEFT/   DONE WITH PENILE PROSTESIS SURG.  . Removal and placement penile prosthesis implant   2005    AND LEFT CORPOROPLASTY /    DONE INGUINAL REPAIR  . Penile prosthesis implant  1995  . Knee arthroscopy  06/02/2011    Procedure: ARTHROSCOPY KNEE;  Surgeon: Loanne Drilling;  Location: [facility];  Service: Orthopedics;  Laterality: Left;  LEFT KNEE ARTHROSCOPY WITH DEBRIDEMENT  . Total knee arthroplasty  12/20/2011    Procedure: TOTAL KNEE ARTHROPLASTY;  Surgeon: Loanne Drilling, MD;  Location: [facility];  Service: Orthopedics;  Laterality: Left;  . Back surgery      Family History  Problem Relation Age of Onset  . Stroke Mother   . Early death Father     Allergies  Allergen Reactions  . Ciprofloxacin Nausea Only    REACTION: GI upset  . Other     SCOLLOPS--SUDDEN GI ATTACK    Current Outpatient Prescriptions on File Prior to Visit  Medication Sig Dispense Refill  . amLODipine (NORVASC) 10 MG tablet Take 1 tablet (10 mg total) by mouth daily.  30 tablet  1  . atorvastatin (LIPITOR) 20 MG tablet Take 20 mg by mouth daily. TAKE ONE TABLET BY MOUTH DAILY      . citalopram (CELEXA) 20 MG tablet Take 1 tablet (20 mg total) by mouth daily.  30 tablet  3  . clotrimazole-betamethasone (LOTRISONE) cream Apply 1 application topically 2 (two) times daily. For psoriasis.      . Cyanocobalamin 500 MCG/0.1ML SOLN Place 500 mcg into the nose once a week.      . diclofenac sodium (VOLTAREN) 1 % GEL Apply 1 application topically 2 (two) times daily as needed. Arthritis in both thumbs.      . esomeprazole (NEXIUM) 40 MG capsule Take 1 capsule (40 mg total) by mouth 2 (two) times daily.  60 capsule  3  . ezetimibe (ZETIA) 10 MG tablet Take 10 mg by mouth daily.      . iron polysaccharides (NIFEREX) 150 MG capsule Take 150 mg by mouth 2 (two) times daily.       Marland Kitchen lisinopril (PRINIVIL,ZESTRIL) 10 MG tablet Take 1 tablet (10 mg total) by mouth 2 (two) times daily.  60 tablet  1  . methotrexate (RHEUMATREX) 2.5 MG tablet TAKE FOUR TABLETS BY MOUTH ONCE A WEEK.  16 tablet  10  . MYRBETRIQ 50 MG TB24 tablet Take 50 mg by mouth daily.       . predniSONE (DELTASONE) 5  MG tablet Take 5 mg by mouth daily.      . Rivaroxaban (XARELTO) 20 MG TABS tablet Take 1 tablet (20 mg total) by mouth daily with supper. This is the new medication for stroke prevention  30 tablet  1  . solifenacin (VESICARE) 10 MG tablet Take 1 tablet (10 mg total) by mouth daily. Use vesicare or ditropan--not both      . triamcinolone cream (KENALOG) 0.1 % APPLY TO AFFECTED AREAS AS DIRECTED  480 g  3   No current facility-administered medications on file prior to visit.    BP 120/64  Pulse 85  Wt 229 lb (103.874 kg)chart    Objective:   Physical Exam  Constitutional: He is oriented to person, place, and time. He appears well-developed and well-nourished.  HENT:  Right Ear: External ear normal.  Left Ear: External ear normal.  Nose: Nose normal.  Mouth/Throat: Oropharynx is clear and moist.  Neck: Normal range of motion. Neck supple.  Cardiovascular: Normal rate, regular rhythm and normal heart sounds.   Pulmonary/Chest: Effort normal and breath sounds normal.  Abdominal: Soft. Bowel sounds are normal.  Musculoskeletal: Normal range of motion. He exhibits no edema and no tenderness.  Grip strength strong bilaterally  Neurological: He is alert and oriented to person, place, and time. He displays normal reflexes. A cranial nerve deficit is present. Coordination normal.  Skin: Skin is warm and dry.  Psychiatric: He has a normal mood and affect.          Assessment & Plan:  Assessment: 1. Status post CVA 2. Long-term anticoagulation therapy 3. Paroxysmal atrial fibrillation 4. Hypertension 5. Hyperlipidemia 6. Restless leg syndrome  Plan: CMP sent today to monitor  kidney function. Will notify patient of results. Encouraged low cholesterol diet. Follow up with neurology as scheduled. Increase Requip to 2 mg twice a day. Consider Norvasc 5 mg twice a day in lieu of 10 mg once daily to help with lightheadedness. Followup with Dr. Lovell Sheehan as scheduled in December and sooner as needed.

## 2013-04-24 NOTE — Patient Instructions (Signed)
Stroke Prevention Some medical conditions and behaviors are associated with an increased chance of having a stroke. You may prevent a stroke by making healthy choices and managing medical conditions. Reduce your risk of having a stroke by:  Staying physically active. Get at least 30 minutes of activity on most or all days.  Not smoking. It may also be helpful to avoid exposure to secondhand smoke.  Limiting alcohol use. Moderate alcohol use is considered to be:  No more than 2 drinks per day for men.  No more than 1 drink per day for nonpregnant women.  Eating healthy foods.  Include 5 or more servings of fruits and vegetables a day.  Certain diets may be prescribed to address high blood pressure, high cholesterol, diabetes, or obesity.  Managing your cholesterol levels.  A low-saturated fat, low-trans fat, low-cholesterol, and high-fiber diet may control cholesterol levels.  Take any prescribed medicines to control cholesterol as directed by your caregiver.  Managing your diabetes.  A controlled-carbohydrate, controlled-sugar diet is recommended to manage diabetes.  Take any prescribed medicines to control diabetes as directed by your caregiver.  Controlling your high blood pressure (hypertension).  A low-salt (sodium), low-saturated fat, low-trans fat, and low-cholesterol diet is recommended to manage high blood pressure.  Take any prescribed medicines to control hypertension as directed by your caregiver.  Maintaining a healthy weight.  A reduced-calorie, low-sodium, low-saturated fat, low-trans fat, low-cholesterol diet is recommended to manage weight.  Stopping drug abuse.  Avoiding birth control pills.  Talk to your caregiver about the risks of taking birth control pills if you are over 35 years old, smoke, get migraines, or have ever had a blood clot.  Getting evaluated for sleep disorders (sleep apnea).  Talk to your caregiver about getting a sleep evaluation  if you snore a lot or have excessive sleepiness.  Taking medicines as directed by your caregiver.  For some people, aspirin or blood thinners (anticoagulants) are helpful in reducing the risk of forming abnormal blood clots that can lead to stroke. If you have the irregular heart rhythm of atrial fibrillation, you should be on a blood thinner unless there is a good reason you cannot take them.  Understand all your medicine instructions. SEEK IMMEDIATE MEDICAL CARE IF:   You have sudden weakness or numbness of the face, arm, or leg, especially on one side of the body.  You have sudden confusion.  You have trouble speaking (aphasia) or understanding.  You have sudden trouble seeing in one or both eyes.  You have sudden trouble walking.  You have dizziness.  You have a loss of balance or coordination.  You have a sudden, severe headache with no known cause.  You have new chest pain or an irregular heartbeat. Any of these symptoms may represent a serious problem that is an emergency. Do not wait to see if the symptoms will go away. Get medical help right away. Call your local emergency services (911 in [place] Do not drive yourself to the hospital. Document Released: 07/15/2004 Document Revised: 08/30/2011 Document Reviewed: 01/25/2011 [employer] Patient Information 2014 [employer].  

## 2013-04-26 ENCOUNTER — Ambulatory Visit: Payer: Medicare Other | Admitting: Speech Pathology

## 2013-04-26 ENCOUNTER — Ambulatory Visit: Payer: Medicare Other | Admitting: Occupational Therapy

## 2013-04-26 ENCOUNTER — Ambulatory Visit: Payer: Medicare Other

## 2013-04-27 ENCOUNTER — Ambulatory Visit: Payer: Medicare Other | Admitting: Occupational Therapy

## 2013-04-29 ENCOUNTER — Other Ambulatory Visit: Payer: Self-pay | Admitting: Internal Medicine

## 2013-04-30 ENCOUNTER — Ambulatory Visit: Payer: Medicare Other

## 2013-04-30 ENCOUNTER — Ambulatory Visit: Payer: Medicare Other | Admitting: Occupational Therapy

## 2013-05-02 ENCOUNTER — Ambulatory Visit: Payer: Medicare Other | Admitting: Physical Therapy

## 2013-05-02 ENCOUNTER — Other Ambulatory Visit: Payer: Self-pay | Admitting: Dermatology

## 2013-05-02 ENCOUNTER — Ambulatory Visit: Payer: Medicare Other | Admitting: Occupational Therapy

## 2013-05-02 ENCOUNTER — Ambulatory Visit: Payer: Medicare Other | Admitting: Speech Pathology

## 2013-05-03 ENCOUNTER — Ambulatory Visit: Payer: Medicare Other | Admitting: Occupational Therapy

## 2013-05-08 ENCOUNTER — Ambulatory Visit: Payer: Medicare Other | Admitting: Speech Pathology

## 2013-05-08 ENCOUNTER — Ambulatory Visit: Payer: Medicare Other | Admitting: Occupational Therapy

## 2013-05-08 ENCOUNTER — Ambulatory Visit: Payer: Medicare Other | Admitting: Physical Therapy

## 2013-05-09 ENCOUNTER — Ambulatory Visit: Payer: Medicare Other | Admitting: Occupational Therapy

## 2013-05-10 ENCOUNTER — Ambulatory Visit: Payer: Medicare Other | Admitting: Occupational Therapy

## 2013-05-10 ENCOUNTER — Ambulatory Visit: Payer: Medicare Other | Admitting: Physical Therapy

## 2013-05-10 ENCOUNTER — Ambulatory Visit: Payer: Medicare Other

## 2013-05-11 ENCOUNTER — Encounter: Payer: Medicare Other | Attending: Physical Medicine & Rehabilitation

## 2013-05-11 ENCOUNTER — Encounter: Payer: Self-pay | Admitting: Physical Medicine & Rehabilitation

## 2013-05-11 ENCOUNTER — Ambulatory Visit (HOSPITAL_BASED_OUTPATIENT_CLINIC_OR_DEPARTMENT_OTHER): Payer: Medicare Other | Admitting: Physical Medicine & Rehabilitation

## 2013-05-11 VITALS — BP 120/49 | HR 85 | Resp 14 | Wt 226.0 lb

## 2013-05-11 DIAGNOSIS — M25519 Pain in unspecified shoulder: Secondary | ICD-10-CM

## 2013-05-11 DIAGNOSIS — H539 Unspecified visual disturbance: Secondary | ICD-10-CM | POA: Insufficient documentation

## 2013-05-11 DIAGNOSIS — H5333 Simultaneous visual perception without fusion: Secondary | ICD-10-CM | POA: Insufficient documentation

## 2013-05-11 DIAGNOSIS — I69993 Ataxia following unspecified cerebrovascular disease: Secondary | ICD-10-CM | POA: Insufficient documentation

## 2013-05-11 DIAGNOSIS — I63511 Cerebral infarction due to unspecified occlusion or stenosis of right middle cerebral artery: Secondary | ICD-10-CM

## 2013-05-11 DIAGNOSIS — R29898 Other symptoms and signs involving the musculoskeletal system: Secondary | ICD-10-CM | POA: Insufficient documentation

## 2013-05-11 DIAGNOSIS — I6350 Cerebral infarction due to unspecified occlusion or stenosis of unspecified cerebral artery: Secondary | ICD-10-CM

## 2013-05-11 DIAGNOSIS — I69998 Other sequelae following unspecified cerebrovascular disease: Secondary | ICD-10-CM | POA: Insufficient documentation

## 2013-05-11 DIAGNOSIS — M1990 Unspecified osteoarthritis, unspecified site: Secondary | ICD-10-CM | POA: Insufficient documentation

## 2013-05-11 DIAGNOSIS — I69919 Unspecified symptoms and signs involving cognitive functions following unspecified cerebrovascular disease: Secondary | ICD-10-CM | POA: Insufficient documentation

## 2013-05-11 DIAGNOSIS — M25511 Pain in right shoulder: Secondary | ICD-10-CM

## 2013-05-11 DIAGNOSIS — M19039 Primary osteoarthritis, unspecified wrist: Secondary | ICD-10-CM

## 2013-05-11 DIAGNOSIS — G8110 Spastic hemiplegia affecting unspecified side: Secondary | ICD-10-CM

## 2013-05-11 NOTE — Progress Notes (Signed)
Subjective:    Patient ID: Christopher Baker, male    DOB: 08/31/1933, 79 y.o.   MRN: [id number] TEJON GRACIE is a 79 y.o. right-handed male with history of hypertension,paroxysmal atrial fibrillation with pacemaker. Patient on no anti-coagulation prior to admission secondary to inconsistent appointments and refused with anticoagulation.. Admitted 04/03/2013 with left-sided weakness and facial droop. Cranial CT scan showed right cortical and subcortical infarcts(right caudate head, right parietal lobe). Infarct felt to be embolic secondary to history of PAF. MRI not completed secondary to pacemaker. Carotid Doppler showed bilateral ICA stenosis 40-59%. Echocardiogram with ejection fraction 55% grade 1 diastolic dysfunction no wall motion abnormalities. CT angiogram head negative for posterior circulation without stenosis or major occlusion. Patient did not receive TPA. Neurology service followup recommendations of Xarelto for CVA prophylaxis. Patient is maintained on a regular diet. Patient maintained on chronic prednisone therapy for history of psoriatic arthritis as well as methotrexate. Physical and occupational therapy evaluations completed an ongoing with recommendations for physical medicine rehabilitation consult to consider inpatient rehabilitation services. Patient was felt to be a good candidate for inpatient rehabilitation services and was admitted for comprehensive rehabilitation program  HPI First visit after discharge from [facility] DATE OF ADMISSION: 04/06/2013  DATE OF DISCHARGE: 04/14/2013  Wife with multiple questions regarding work regarding therapy regarding pain medications regarding a form so they can get their money back for a cruise    Outpatient therapy 3 days a week PT OT speech therapy Has seen PA from primary care, only medication change was giving half of the amlodipine dose twice a day  Work meetings once a week Pain Inventory Average Pain 6 Pain Right Now 3 My pain is  sharp, dull and aching  In the last 24 hours, has pain interfered with the following? General activity 5 Relation with others 7 Enjoyment of life 3 What TIME of day is your pain at its worst? evening Sleep (in general) Good  Pain is worse with: inactivity, standing and some activites Pain improves with: heat/ice, therapy/exercise and medication Relief from Meds: 8  Mobility walk without assistance use a cane how many minutes can you walk? 30 ability to climb steps?  yes do you drive?  no Do you have any goals in this area?  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  yes  Neuro/Psych bladder control problems bowel control problems weakness numbness tremor trouble walking dizziness confusion depression  Prior Studies Any changes since last visit?  no  Physicians involved in your care Any changes since last visit?  no   Family History  Problem Relation Age of Onset  . Stroke Mother   . Early death Father    History   Social History  . Marital Status: Married    Spouse Name: N/A    Number of Children: N/A  . Years of Education: N/A   Occupational History  . retired    Social History Main Topics  . Smoking status: Former Smoker -- 0.10 packs/day for 55 years    Types: Cigarettes  . Smokeless tobacco: Never Used     Comment: pt statest he smokes about "once a month"   . Alcohol Use: Yes     Comment: RARE  . Drug Use: No  . Sexual Activity: Yes   Other Topics Concern  . None   Social History Narrative  . None   Past Surgical History  Procedure Laterality Date  . Replacement total knee  1997    RIGHT  . Cardiac pacemaker placement  06-04-10    DDD/ AND REMOVAL LOOR RECORDER  . Loop recorder placement  01-30-10  . Lumbar laminectomy/ diskectomy/ fusion  05-19-10    L4 - 5  . Cholecystectomy  2009  . Cardiac electrophysiology study and ablation  2008     FOR SVT  . Prostate biopsy  2007  . Transurethral resection of prostate  2006  . Inguinal hernia repair  2005    LEFT/   DONE WITH PENILE PROSTESIS SURG.  . Removal and placement penile prosthesis implant   2005    AND LEFT CORPOROPLASTY /    DONE INGUINAL REPAIR  . Penile prosthesis implant  1995  . Knee arthroscopy  06/02/2011    Procedure: ARTHROSCOPY KNEE;  Surgeon: Loanne Drilling;  Location: [facility];  Service: Orthopedics;  Laterality: Left;  LEFT KNEE ARTHROSCOPY WITH DEBRIDEMENT  . Total knee arthroplasty  12/20/2011    Procedure: TOTAL KNEE ARTHROPLASTY;  Surgeon: Loanne Drilling, MD;  Location: [facility];  Service: Orthopedics;  Laterality: Left;  . Back surgery     Past Medical History  Diagnosis Date  . Hyperlipidemia   . HTN (hypertension)   . Status post placement of cardiac pacemaker DDD-  06-04-10-- LAST CHECK 09-08-10 IN EPIC    SECONDARY TO SYNCOPY AND BRADYCARDIA  . Restless leg syndrome   . Normal nuclear stress test 2008    LOW RISK--   DR  WALL  . Carotid stenosis, bilateral PER DR EARLY / DUPLEX  09-09-10      40 - 59%   BILATERALLY--- ASYMPTOMATIC  . History of echocardiogram 11-21-2006    EF 65%  . History of prostate cancer S/P RADIATION  TX  6 YRS AGO  . Psoriasis   . Arthritis with psoriasis   . GERD (gastroesophageal reflux disease)     CONTROLLED W/ PROTONIX  . Borderline diabetes     DIET CONTROLLED - PT STATES HE IS NOT DIABETIC  . Lumbar spondylosis W/ RADICULOPATHY  . Iron deficiency   . Cataract immature LEFT EYE  . PAF (paroxysmal atrial fibrillation)   . SVT (supraventricular tachycardia)     S/P ABLATION   2008  . Coronary artery disease CARDIOLOGIST- DR Sharrell Ku    09-08-10 VISIT AND PACEMAKER CHECK  IN EPIC  . Cancer     HX OF PROSTATE CANCER--TX'D WITH RADIATION  . Sleep apnea TESTED YRS AGO-- NO CPAP RX GIVEN    PT STATES HE DOES NOT THINK HE STILL HAS SLEEP APNEA--DOES NOT USE CPAP   BP 120/49  Pulse 85   Resp 14  Wt 226 lb (102.513 kg)  SpO2 92%     Review of Systems  Genitourinary: Positive for difficulty urinating.  Neurological: Positive for dizziness, tremors, weakness and numbness.  Psychiatric/Behavioral: Positive for confusion and dysphoric mood.  All other systems reviewed and are negative.       Objective:   Physical Exam  Nursing note and vitals reviewed. Constitutional: He is oriented to person, place, and time. He appears well-nourished.  HENT:  Head: Normocephalic and atraumatic.  Eyes: Conjunctivae and EOM are normal. Pupils are equal, round, and reactive to light.  Neck: Normal range of motion.  Neurological: He is alert and oriented to person, place, and time. He displays no atrophy. No sensory deficit. He exhibits normal muscle tone. Coordination and gait abnormal.  Motor strength is 4/5 in the left deltoid, bicep, tricep, grip 5/5 in bilateral hip flexors knee extensors ankle dorsiflexors and plantar flexor 5/5 in the right deltoid, bicep and tricep and grip Ambulates with a straight cane but can also ambulate without cane indoors.  Decreased fine motor coordination left hand  Patient is forgetful about his medications as well as recent therapy and physician visits  Psychiatric: He has a normal mood and affect. His speech is delayed. He is slowed. He exhibits abnormal recent memory.      Assessment & Plan:  1. Right MCA infarct with cognitive deficits, visual perceptual deficits, residual left upper extremity weakness as well as decreased balance. Recommend outpatient PT OT and speech.  Recommend no driving  He can have a 1 hour at work meeting once a week until I reevaluate him in one month. Then I would hope he can go up to 2 one-hour word meanings per week  2. Osteoarthritis.Discussed reasons for no nonsteroidal anti-inflammatories. May use Tylenol 650 mg every 6 hours as needed  The shoulder pain is not adequately controlled would recommend cortisone  injection next visit

## 2013-05-14 ENCOUNTER — Ambulatory Visit: Payer: Medicare Other | Admitting: Speech Pathology

## 2013-05-14 ENCOUNTER — Ambulatory Visit: Payer: Medicare Other | Admitting: Occupational Therapy

## 2013-05-14 ENCOUNTER — Ambulatory Visit: Payer: Medicare Other

## 2013-05-15 ENCOUNTER — Ambulatory Visit: Payer: Medicare Other | Admitting: Occupational Therapy

## 2013-05-16 ENCOUNTER — Ambulatory Visit: Payer: Medicare Other

## 2013-05-16 ENCOUNTER — Ambulatory Visit: Payer: Medicare Other | Admitting: Occupational Therapy

## 2013-05-16 ENCOUNTER — Ambulatory Visit: Payer: Medicare Other | Admitting: Neurology

## 2013-05-21 ENCOUNTER — Ambulatory Visit (INDEPENDENT_AMBULATORY_CARE_PROVIDER_SITE_OTHER): Payer: Medicare Other | Admitting: Neurology

## 2013-05-21 ENCOUNTER — Ambulatory Visit: Payer: Medicare Other | Admitting: Neurology

## 2013-05-21 ENCOUNTER — Encounter: Payer: Self-pay | Admitting: Neurology

## 2013-05-21 ENCOUNTER — Other Ambulatory Visit (INDEPENDENT_AMBULATORY_CARE_PROVIDER_SITE_OTHER): Payer: Medicare Other

## 2013-05-21 VITALS — BP 90/50 | HR 72 | Temp 98.1°F | Ht 75.0 in | Wt 228.0 lb

## 2013-05-21 DIAGNOSIS — I10 Essential (primary) hypertension: Secondary | ICD-10-CM

## 2013-05-21 DIAGNOSIS — I639 Cerebral infarction, unspecified: Secondary | ICD-10-CM

## 2013-05-21 DIAGNOSIS — R251 Tremor, unspecified: Secondary | ICD-10-CM

## 2013-05-21 DIAGNOSIS — R259 Unspecified abnormal involuntary movements: Secondary | ICD-10-CM

## 2013-05-21 DIAGNOSIS — I6340 Cerebral infarction due to embolism of unspecified cerebral artery: Secondary | ICD-10-CM

## 2013-05-21 DIAGNOSIS — Z0000 Encounter for general adult medical examination without abnormal findings: Secondary | ICD-10-CM

## 2013-05-21 DIAGNOSIS — R413 Other amnesia: Secondary | ICD-10-CM

## 2013-05-21 DIAGNOSIS — E785 Hyperlipidemia, unspecified: Secondary | ICD-10-CM

## 2013-05-21 LAB — BASIC METABOLIC PANEL
BUN: 19 mg/dL (ref 6–23)
CO2: 27 mEq/L (ref 19–32)
Calcium: 9.4 mg/dL (ref 8.4–10.5)
GFR: 71.85 mL/min (ref >60.00)
Glucose, Bld: 93 mg/dL (ref 70–99)
Potassium: 4.1 mEq/L (ref 3.5–5.1)
Sodium: 142 mEq/L (ref 135–145)

## 2013-05-21 LAB — LIPID PANEL
Cholesterol: 130 mg/dL (ref 0–200)
LDL Cholesterol: 62 mg/dL (ref 0–99)
Total CHOL/HDL Ratio: 3
Triglycerides: 122 mg/dL (ref 0.0–149.0)

## 2013-05-21 LAB — CBC WITH DIFFERENTIAL/PLATELET
Basophils Relative: 0.1 % (ref 0.0–3.0)
Eosinophils Absolute: 0.1 10*3/uL (ref 0.0–0.7)
Eosinophils Relative: 1 % (ref 0.0–5.0)
HCT: 40.9 % (ref 39.0–52.0)
Hemoglobin: 13.8 g/dL (ref 13.0–17.0)
Lymphs Abs: 0.8 10*3/uL (ref 0.7–4.0)
MCHC: 33.8 g/dL (ref 30.0–36.0)
MCV: 95.2 fl (ref 78.0–100.0)
Monocytes Absolute: 1 10*3/uL (ref 0.1–1.0)
Platelets: 219 10*3/uL (ref 150.0–400.0)
WBC: 8.3 10*3/uL (ref 4.5–10.5)

## 2013-05-21 LAB — TSH: TSH: 1.91 u[IU]/mL (ref 0.35–5.50)

## 2013-05-21 LAB — HEPATIC FUNCTION PANEL
Alkaline Phosphatase: 62 U/L (ref 39–117)
Bilirubin, Direct: 0.1 mg/dL (ref 0.0–0.3)
Total Protein: 5.6 g/dL — ABNORMAL LOW (ref 6.0–8.3)

## 2013-05-21 NOTE — Patient Instructions (Signed)
Continue the Xarelto. Continue cholesterol control and blood pressure control Exercise regularly Mediterranean diet. Follow up as scheduled in a few months.

## 2013-05-21 NOTE — Progress Notes (Signed)
NEUROLOGY FOLLOW UP OFFICE NOTE  Christopher Baker [id number]  HISTORY OF PRESENT ILLNESS: Christopher Baker is a 79 year old right-handed man with history of hypertension, hyperlipidemia, paroxysmal atrial fibrillation with pacemaker but no AC, who presents for recent cardio-embolic strokes.  He is accompanied by his wife.  Records and images were personally reviewed where available.   He was admitted to the hospital on 04/03/13 with left hand and arm weakness and facial droop.  CT head revealed right basal ganglia and parietal lobe infarcts.  CTA of head revealed no major intracranial stenosis or occlusion.  Carotid dopplers revealed bilateral 40-59% ICA stenosis.  2D Echo revealed EF 55% and grade 1 diastolic dysfunction. Hgb A1c was 5.5.  LDL was 88.  He was found to have atrial fibrillation.  He was recommended to start Xarelto.  Blood pressures have been 120-140/60s-70s.  Since discharge and rehab, he is pretty much back to baseline.  Over the last 2 years, he has had problems with memory.  he was repeating things and exhibited mental fogginess and lack of motivation.  He was experiencing depression and insomnia as well.  Procedural tasks and executive functioning were intact.  He was found to have a B12 level of 52 and was started on B12 shots.  He and his wife had noticed improvement in his memory.  However, insomnia and depression were still both an issue.  He has been under a lot of stress.  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  He also purchased a new home while still trying to sell his other condo, resulting in 1-2 years of two house payments.  He scored well on mini mental status testing.  This past June, he was started on Celexa for depression and Proson for insomnia.  His urologist had started him on Vesicare and Myrbetriq to control urinary issues as a result of prostate cancer and radiation in 2007.  Over the summer, he exhibited extreme lethargy,  to the point where he was sleeping during the day, which he never previously done.  Sometimes, he was in such a deep sleep, it required repeated yelling and shaking to wake him up.  He seemed more "foggy" again.  One time, he couldn't figure out where to put the ice cream in the refrigerator.  When his wife told him to put it in the freezer, he was placing it in the vegetable drawer.  He decided to stop the Proson, and he and his wife noticed significant improvement in lethargy.  He did note some improvement in memory since starting B12 supplementation.  Last visit, MOCA score was 27/30.  Memory issues have been fairly stable, if not a little better.  Several months ago, he began noticing mild tremor in his hands, noticeable when he holds a utensil or writes.  He first noticed this about a month or so ago.  He has some balance issues due to bilateral knee replacement surgeries.  No family history of dementia or tremor (possibly his mother had tremor).  I questioned if the tremor was essential tremor vs secondary to Celexa.  PAST MEDICAL HISTORY: Past Medical History  Diagnosis Date  . Hyperlipidemia   . HTN (hypertension)   . Status post placement of cardiac pacemaker DDD-  06-04-10-- LAST CHECK 09-08-10 IN EPIC    SECONDARY TO SYNCOPY AND BRADYCARDIA  . Restless leg syndrome   . Normal nuclear stress test 2008    LOW  RISK--   DR  WALL  . Carotid stenosis, bilateral PER DR EARLY / DUPLEX  09-09-10      40 - 59%   BILATERALLY--- ASYMPTOMATIC  . History of echocardiogram 11-21-2006    EF 65%  . History of prostate cancer S/P RADIATION  [place]  6 YRS AGO  . Psoriasis   . Arthritis with psoriasis   . GERD (gastroesophageal reflux disease)     CONTROLLED W/ PROTONIX  . Borderline diabetes     DIET CONTROLLED - PT STATES HE IS NOT DIABETIC  . Lumbar spondylosis W/ RADICULOPATHY  . Iron deficiency   . Cataract immature LEFT EYE  . PAF (paroxysmal atrial fibrillation)   . SVT (supraventricular  tachycardia)     S/P ABLATION   2008  . Coronary artery disease CARDIOLOGIST- DR Sharrell Ku    09-08-10 VISIT AND PACEMAKER CHECK  IN EPIC  . Cancer     HX OF PROSTATE CANCER--TX'D WITH RADIATION  . Sleep apnea TESTED YRS AGO-- NO CPAP RX GIVEN    PT STATES HE DOES NOT THINK HE STILL HAS SLEEP APNEA--DOES NOT USE CPAP  . Stroke     MEDICATIONS: Current Outpatient Prescriptions on File Prior to Visit  Medication Sig Dispense Refill  . amLODipine (NORVASC) 10 MG tablet Take 1 tablet (10 mg total) by mouth daily.  30 tablet  1  . atorvastatin (LIPITOR) 20 MG tablet Take 20 mg by mouth daily. TAKE ONE TABLET BY MOUTH DAILY      . citalopram (CELEXA) 20 MG tablet TAKE 1 TABLET (20 MG TOTAL) BY MOUTH DAILY.  30 tablet  2  . clotrimazole-betamethasone (LOTRISONE) cream Apply 1 application topically 2 (two) times daily. For psoriasis.      Marland Kitchen diclofenac sodium (VOLTAREN) 1 % GEL Apply 1 application topically 2 (two) times daily as needed. Arthritis in both thumbs.      . esomeprazole (NEXIUM) 40 MG capsule Take 1 capsule (40 mg total) by mouth 2 (two) times daily.  60 capsule  3  . ezetimibe (ZETIA) 10 MG tablet Take 10 mg by mouth daily.      . iron polysaccharides (NIFEREX) 150 MG capsule Take 150 mg by mouth 2 (two) times daily.      Marland Kitchen lisinopril (PRINIVIL,ZESTRIL) 10 MG tablet Take 1 tablet (10 mg total) by mouth 2 (two) times daily.  60 tablet  1  . methotrexate (RHEUMATREX) 2.5 MG tablet TAKE FOUR TABLETS BY MOUTH ONCE A WEEK.  16 tablet  10  . MYRBETRIQ 50 MG TB24 tablet Take 50 mg by mouth daily.       . predniSONE (DELTASONE) 5 MG tablet Take 5 mg by mouth daily.      . Rivaroxaban (XARELTO) 20 MG TABS tablet Take 1 tablet (20 mg total) by mouth daily with supper. This is the new medication for stroke prevention  30 tablet  1  . solifenacin (VESICARE) 10 MG tablet Take 1 tablet (10 mg total) by mouth daily. Use vesicare or ditropan--not both      . triamcinolone cream (KENALOG) 0.1 %  APPLY TO AFFECTED AREAS AS DIRECTED  480 g  3   No current facility-administered medications on file prior to visit.    ALLERGIES: Allergies  Allergen Reactions  . Ciprofloxacin Nausea Only    REACTION: GI upset  . Other     SCOLLOPS--SUDDEN GI ATTACK    FAMILY HISTORY: Family History  Problem Relation Age of Onset  . Stroke Mother   .  Early death Father     SOCIAL HISTORY: History   Social History  . Marital Status: Married    Spouse Name: N/A    Number of Children: N/A  . Years of Education: N/A   Occupational History  . retired    Social History Main Topics  . Smoking status: Former Smoker -- 0.10 packs/day for 55 years    Types: Cigarettes  . Smokeless tobacco: Never Used     Comment: pt statest he smokes about "once a month"   . Alcohol Use: Yes     Comment: RARE  . Drug Use: No  . Sexual Activity: Yes   Other Topics Concern  . Not on file   Social History Narrative  . No narrative on file    REVIEW OF SYSTEMS: Constitutional: No fevers, chills, or sweats, no generalized fatigue, change in appetite Eyes: No visual changes, double vision, eye pain Ear, nose and throat: No hearing loss, ear pain, nasal congestion, sore throat Cardiovascular: No chest pain, palpitations Respiratory:  No shortness of breath at rest or with exertion, wheezes GastrointestinaI: No nausea, vomiting, diarrhea, abdominal pain, fecal incontinence Genitourinary:  No dysuria, urinary retention or frequency Musculoskeletal:  No neck pain, back pain Integumentary: No rash, pruritus, skin lesions Neurological: as above Psychiatric: No depression, insomnia, anxiety Endocrine: No palpitations, fatigue, diaphoresis, mood swings, change in appetite, change in weight, increased thirst Hematologic/Lymphatic:  No anemia, purpura, petechiae. Allergic/Immunologic: no itchy/runny eyes, nasal congestion, recent allergic reactions, rashes  PHYSICAL EXAM: Filed Vitals:   05/21/13 1520    BP: 90/50  Pulse: 72  Temp: 98.1 F (36.7 C)   General: No acute distress Head:  Normocephalic/atraumatic Neck: supple, no paraspinal tenderness, full range of motion Heart:  Regular rate and rhythm Lungs:  Clear to auscultation bilaterally Back: No paraspinal tenderness Neurological Exam: alert and oriented to person, place, and time. Speech fluent and not dysarthric, language intact.  CN II-XII intact. Bulk and tone normal, muscle strength 5/5 throughout.  Sensation to light touch, temperature and vibration intact.  Deep tendon reflexes 2+ throughout, toes downgoing.  Finger to nose intact.  Gait normal based, some unsteadiness with tandem, Romberg negative.  IMPRESSION:  1.  Recent cardio-embolic stroke 2.  Memory problems, possibly related to B12 deficiency. 3. Tremor.  Benign essential tremor vs related to Celexa.  Symptoms mild and Celexa is helping depression, so I wouldn't make any changes at this time.  PLAN: 1.  Continue Xarelto for anticoagulation and secondary stroke prevention. 2. Continue atorvastatin and blood pressure control. 3. Regular exercise and Mediterranean diet. 4. Continue B12 supplementation. 5. Follow up in a few months, as already scheduled, to re-assess memory.  Consider neuropsychological testing if persists or worse.  Shon Millet, DO  CC:  Darryll Capers, MD  Adline Mango, FNP

## 2013-05-21 NOTE — Addendum Note (Signed)
Addended by: Rossie Muskrat K on: 05/21/2013 11:00 AM   Modules accepted: Orders

## 2013-05-22 ENCOUNTER — Ambulatory Visit: Payer: Medicare Other

## 2013-05-22 ENCOUNTER — Ambulatory Visit: Payer: Medicare Other | Attending: Physical Medicine & Rehabilitation | Admitting: Occupational Therapy

## 2013-05-22 DIAGNOSIS — R279 Unspecified lack of coordination: Secondary | ICD-10-CM | POA: Insufficient documentation

## 2013-05-22 DIAGNOSIS — Z5189 Encounter for other specified aftercare: Secondary | ICD-10-CM | POA: Insufficient documentation

## 2013-05-22 DIAGNOSIS — I69928 Other speech and language deficits following unspecified cerebrovascular disease: Secondary | ICD-10-CM | POA: Insufficient documentation

## 2013-05-22 DIAGNOSIS — R41841 Cognitive communication deficit: Secondary | ICD-10-CM | POA: Insufficient documentation

## 2013-05-24 ENCOUNTER — Ambulatory Visit: Payer: Medicare Other

## 2013-05-24 ENCOUNTER — Ambulatory Visit: Payer: Medicare Other | Admitting: Occupational Therapy

## 2013-05-28 ENCOUNTER — Ambulatory Visit: Payer: Medicare Other

## 2013-05-28 ENCOUNTER — Ambulatory Visit: Payer: Medicare Other | Admitting: Occupational Therapy

## 2013-05-31 ENCOUNTER — Other Ambulatory Visit: Payer: Self-pay | Admitting: Internal Medicine

## 2013-06-01 ENCOUNTER — Ambulatory Visit: Payer: Medicare Other | Admitting: Physical Therapy

## 2013-06-01 ENCOUNTER — Ambulatory Visit: Payer: Medicare Other

## 2013-06-01 ENCOUNTER — Ambulatory Visit: Payer: Medicare Other | Admitting: Occupational Therapy

## 2013-06-04 ENCOUNTER — Encounter: Payer: Self-pay | Admitting: Internal Medicine

## 2013-06-04 ENCOUNTER — Ambulatory Visit (INDEPENDENT_AMBULATORY_CARE_PROVIDER_SITE_OTHER): Payer: Medicare Other | Admitting: Internal Medicine

## 2013-06-04 VITALS — BP 132/70 | HR 80 | Temp 97.9°F | Resp 18 | Ht 75.0 in | Wt 228.0 lb

## 2013-06-04 DIAGNOSIS — K219 Gastro-esophageal reflux disease without esophagitis: Secondary | ICD-10-CM

## 2013-06-04 DIAGNOSIS — Z0000 Encounter for general adult medical examination without abnormal findings: Secondary | ICD-10-CM

## 2013-06-04 DIAGNOSIS — L600 Ingrowing nail: Secondary | ICD-10-CM

## 2013-06-04 MED ORDER — PREDNISONE 5 MG PO TABS: 7.5000 mg | ORAL_TABLET | Freq: Every day | ORAL | Status: DC

## 2013-06-04 MED ORDER — PANTOPRAZOLE SODIUM 40 MG PO TBEC: 40.0000 mg | DELAYED_RELEASE_TABLET | Freq: Every day | ORAL | Status: DC

## 2013-06-04 NOTE — Progress Notes (Signed)
Pre visit review using our clinic review tool, if applicable. No additional management support is needed unless otherwise documented below in the visit note. 

## 2013-06-04 NOTE — Progress Notes (Signed)
   Subjective:    Patient ID: Christopher Baker, male    DOB: 10/07/1933, 79 y.o.   MRN: [id number]  HPI  CVA  OCT 13/14 and fell as a result AF , male and memory loss ER, admitted and then had rehab. 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  Patient was admitted with a cardioembolic stroke.  He has undergone rehabilitation both inpatient and continues outpatient he has good comprehension some persistent word searching but generally has made remarkable progress.  His lab work was reviewed and his cholesterol on the combination of Zetia and Lipitor is excellent his blood pressure stable his current medication.      Review of Systems  Constitutional: Negative for fever and fatigue.  HENT: Positive for hearing loss. Negative for congestion and postnasal drip.   Eyes: Negative for discharge, redness and visual disturbance.  Respiratory: Negative for cough, shortness of breath and wheezing.   Cardiovascular: Negative for leg swelling.  Gastrointestinal: Negative for abdominal pain, constipation and abdominal distention.  Genitourinary: Negative for urgency and frequency.  Musculoskeletal: Positive for arthralgias, gait problem, joint swelling and neck stiffness. Negative for neck pain.  Skin: Negative for color change and rash.  Neurological: Negative for weakness and light-headedness.  Hematological: Negative for adenopathy.  Psychiatric/Behavioral: Positive for confusion and dysphoric mood. Negative for behavioral problems.       Objective:   Physical Exam  Constitutional: He is oriented to person, place, and time. He appears well-developed and well-nourished.  HENT:  Head: Normocephalic and atraumatic.  Eyes: Conjunctivae are normal. Pupils are equal, round, and reactive to light.  Neck: Normal range of motion. Neck supple.  Cardiovascular:  Murmur heard. Irregular rate  Pulmonary/Chest: Effort normal and breath sounds normal.  2 plus edema  Abdominal: Soft. Bowel sounds  are normal.  Genitourinary:  Defer to Dr Christopher Baker  Musculoskeletal: He exhibits edema and tenderness.  Neurological: He is alert and oriented to person, place, and time.  Skin: Skin is warm and dry.          Assessment & Plan:   Patient presents for yearly preventative medicine examination.   all immunizations and health maintenance protocols were reviewed with the patient and they are up to date with these protocols.   screening laboratory values were reviewed with the patient including screening of hyperlipidemia PSA renal function and hepatic function.   There medications past medical history social history problem list and allergies were reviewed in detail.   Goals were established with regard to weight loss exercise diet in compliance with medications   Stable  HTN Stable GERD  Joint pain NSAIDs stopped due to oral anticoagulation Tylenol first choice Increased psoriatic pain Continue MXT andincreased prednisone to 7.5

## 2013-06-04 NOTE — Patient Instructions (Signed)
The patient is instructed to continue all medications as prescribed. Schedule followup with check out clerk upon leaving the clinic  

## 2013-06-05 ENCOUNTER — Ambulatory Visit: Payer: Medicare Other

## 2013-06-05 ENCOUNTER — Ambulatory Visit: Payer: Medicare Other | Admitting: Occupational Therapy

## 2013-06-05 ENCOUNTER — Ambulatory Visit: Payer: Medicare Other | Admitting: Speech Pathology

## 2013-06-07 ENCOUNTER — Ambulatory Visit: Payer: Medicare Other | Admitting: Occupational Therapy

## 2013-06-07 ENCOUNTER — Ambulatory Visit: Payer: Medicare Other | Admitting: Speech Pathology

## 2013-06-07 ENCOUNTER — Ambulatory Visit: Payer: Medicare Other | Admitting: Physical Therapy

## 2013-06-10 ENCOUNTER — Other Ambulatory Visit: Payer: Self-pay | Admitting: Internal Medicine

## 2013-06-11 ENCOUNTER — Ambulatory Visit: Payer: Medicare Other | Admitting: Occupational Therapy

## 2013-06-11 ENCOUNTER — Other Ambulatory Visit: Payer: Self-pay | Admitting: Physical Medicine and Rehabilitation

## 2013-06-11 ENCOUNTER — Ambulatory Visit: Payer: Medicare Other

## 2013-06-11 ENCOUNTER — Other Ambulatory Visit: Payer: Self-pay | Admitting: Internal Medicine

## 2013-06-12 ENCOUNTER — Ambulatory Visit: Payer: Medicare Other | Admitting: Occupational Therapy

## 2013-06-12 ENCOUNTER — Ambulatory Visit: Payer: Medicare Other | Admitting: Speech Pathology

## 2013-06-12 ENCOUNTER — Ambulatory Visit: Payer: Medicare Other

## 2013-06-13 ENCOUNTER — Telehealth: Payer: Self-pay | Admitting: Internal Medicine

## 2013-06-13 ENCOUNTER — Other Ambulatory Visit: Payer: Self-pay | Admitting: *Deleted

## 2013-06-13 MED ORDER — RIVAROXABAN 20 MG PO TABS: 20.0000 mg | ORAL_TABLET | Freq: Every day | ORAL | Status: DC

## 2013-06-13 NOTE — Telephone Encounter (Signed)
done

## 2013-06-13 NOTE — Telephone Encounter (Signed)
[facility] requesting refill of Rivaroxaban (XARELTO) 20 MG TABS tablet #30, last filled 05/07/13

## 2013-06-16 ENCOUNTER — Encounter (HOSPITAL_BASED_OUTPATIENT_CLINIC_OR_DEPARTMENT_OTHER): Payer: Self-pay | Admitting: Emergency Medicine

## 2013-06-16 ENCOUNTER — Other Ambulatory Visit: Payer: Self-pay | Admitting: Internal Medicine

## 2013-06-16 ENCOUNTER — Emergency Department (HOSPITAL_BASED_OUTPATIENT_CLINIC_OR_DEPARTMENT_OTHER)
Admission: EM | Admit: 2013-06-16 | Discharge: 2013-06-16 | Disposition: A | Discharge status: Home / Self Care | Payer: Medicare Other | Attending: Emergency Medicine | Admitting: Emergency Medicine

## 2013-06-16 ENCOUNTER — Encounter: Payer: Self-pay | Admitting: Internal Medicine

## 2013-06-16 DIAGNOSIS — Z8673 Personal history of transient ischemic attack (TIA), and cerebral infarction without residual deficits: Secondary | ICD-10-CM | POA: Insufficient documentation

## 2013-06-16 DIAGNOSIS — D509 Iron deficiency anemia, unspecified: Secondary | ICD-10-CM | POA: Insufficient documentation

## 2013-06-16 DIAGNOSIS — Z950 Presence of cardiac pacemaker: Secondary | ICD-10-CM | POA: Insufficient documentation

## 2013-06-16 DIAGNOSIS — I2510 Atherosclerotic heart disease of native coronary artery without angina pectoris: Secondary | ICD-10-CM | POA: Insufficient documentation

## 2013-06-16 DIAGNOSIS — M129 Arthropathy, unspecified: Secondary | ICD-10-CM | POA: Insufficient documentation

## 2013-06-16 DIAGNOSIS — Z7901 Long term (current) use of anticoagulants: Secondary | ICD-10-CM | POA: Insufficient documentation

## 2013-06-16 DIAGNOSIS — Z872 Personal history of diseases of the skin and subcutaneous tissue: Secondary | ICD-10-CM | POA: Insufficient documentation

## 2013-06-16 DIAGNOSIS — G2581 Restless legs syndrome: Secondary | ICD-10-CM | POA: Insufficient documentation

## 2013-06-16 DIAGNOSIS — Z87891 Personal history of nicotine dependence: Secondary | ICD-10-CM | POA: Insufficient documentation

## 2013-06-16 DIAGNOSIS — I10 Essential (primary) hypertension: Secondary | ICD-10-CM | POA: Insufficient documentation

## 2013-06-16 DIAGNOSIS — E785 Hyperlipidemia, unspecified: Secondary | ICD-10-CM | POA: Insufficient documentation

## 2013-06-16 DIAGNOSIS — J069 Acute upper respiratory infection, unspecified: Secondary | ICD-10-CM

## 2013-06-16 DIAGNOSIS — Z96659 Presence of unspecified artificial knee joint: Secondary | ICD-10-CM | POA: Insufficient documentation

## 2013-06-16 DIAGNOSIS — R52 Pain, unspecified: Secondary | ICD-10-CM | POA: Insufficient documentation

## 2013-06-16 DIAGNOSIS — I4891 Unspecified atrial fibrillation: Secondary | ICD-10-CM | POA: Insufficient documentation

## 2013-06-16 DIAGNOSIS — Z79899 Other long term (current) drug therapy: Secondary | ICD-10-CM | POA: Insufficient documentation

## 2013-06-16 DIAGNOSIS — K219 Gastro-esophageal reflux disease without esophagitis: Secondary | ICD-10-CM | POA: Insufficient documentation

## 2013-06-16 DIAGNOSIS — Z8546 Personal history of malignant neoplasm of prostate: Secondary | ICD-10-CM | POA: Insufficient documentation

## 2013-06-16 DIAGNOSIS — IMO0002 Reserved for concepts with insufficient information to code with codable children: Secondary | ICD-10-CM | POA: Insufficient documentation

## 2013-06-16 MED ORDER — OSELTAMIVIR PHOSPHATE 75 MG PO CAPS: 75.0000 mg | ORAL_CAPSULE | Freq: Two times a day (BID) | ORAL | Status: DC

## 2013-06-16 NOTE — Discharge Instructions (Signed)

## 2013-06-16 NOTE — ED Provider Notes (Signed)
CSN: [id number]     Arrival date & time 06/16/13  1349 History  This chart was scribed for Christopher Baker. Oletta Lamas, MD by Dorothey Baseman, ED Scribe. This patient was seen in room MH10/MH10 and the patient's care was started at 4:04 PM.    Chief Complaint  Patient presents with  . Cough  . Chills   The history is provided by the patient and the spouse. No language interpreter was used.   HPI Comments: Christopher Baker is a 79 y.o. male who presents to the Emergency Department complaining of a gradual onset, constant, dry cough with associated sore throat, postnasal drip, congestion, chills, and diffuse myalgias since yesterday. He reports taking Tylenol at home yesterday without significant relief. His wife reports that she has been sick with similar symptoms for the past week but more severe. He denies fever (patient is afebrile in the ED), sneezing, abdominal pain, nausea, emesis, diarrhea. He reports that he did receive a flu vaccination this year. Patient has an allergy to ciprofloxacin. Patient has a history of HTN, hyperlipidemia, PAF, SVT, CAD, and stroke.  Past Medical History  Diagnosis Date  . Hyperlipidemia   . HTN (hypertension)   . Status post placement of cardiac pacemaker DDD-  06-04-10-- LAST CHECK 09-08-10 IN EPIC    SECONDARY TO SYNCOPY AND BRADYCARDIA  . Restless leg syndrome   . Normal nuclear stress test 2008    LOW RISK--   DR  WALL  . Carotid stenosis, bilateral PER DR EARLY / DUPLEX  09-09-10      40 - 59%   BILATERALLY--- ASYMPTOMATIC  . History of echocardiogram 11-21-2006    EF 65%  . History of prostate cancer S/P RADIATION  TX  6 YRS AGO  . Psoriasis   . Arthritis with psoriasis   . GERD (gastroesophageal reflux disease)     CONTROLLED W/ PROTONIX  . Borderline diabetes     DIET CONTROLLED - PT STATES HE IS NOT DIABETIC  . Lumbar spondylosis W/ RADICULOPATHY  . Iron deficiency   . Cataract immature LEFT EYE  . PAF (paroxysmal atrial fibrillation)   . SVT  (supraventricular tachycardia)     S/P ABLATION   2008  . Coronary artery disease CARDIOLOGIST- DR Sharrell Ku    09-08-10 VISIT AND PACEMAKER CHECK  IN EPIC  . Cancer     HX OF PROSTATE CANCER--TX'D WITH RADIATION  . Sleep apnea TESTED YRS AGO-- NO CPAP RX GIVEN    PT STATES HE DOES NOT THINK HE STILL HAS SLEEP APNEA--DOES NOT USE CPAP  . Stroke    Past Surgical History  Procedure Laterality Date  . Replacement total knee  1997    RIGHT  . Cardiac pacemaker placement  06-04-10    DDD/ AND REMOVAL LOOR RECORDER  . Loop recorder placement  01-30-10  . Lumbar laminectomy/ diskectomy/ fusion  05-19-10    L4 - 5  . Cholecystectomy  2009  . Cardiac electrophysiology study and ablation  2008    FOR SVT  . Prostate biopsy  2007  . Transurethral resection of prostate  2006  . Inguinal hernia repair  2005    LEFT/   DONE WITH PENILE PROSTESIS SURG.  . Removal and placement penile prosthesis implant   2005    AND LEFT CORPOROPLASTY /    DONE INGUINAL REPAIR  . Penile prosthesis implant  1995  . Knee arthroscopy  06/02/2011    Procedure: ARTHROSCOPY KNEE;  Surgeon: Loanne Drilling;  Location: [facility];  Service: Orthopedics;  Laterality: Left;  LEFT KNEE ARTHROSCOPY WITH DEBRIDEMENT  . Total knee arthroplasty  12/20/2011    Procedure: TOTAL KNEE ARTHROPLASTY;  Surgeon: Loanne Drilling, MD;  Location: [facility];  Service: Orthopedics;  Laterality: Left;  . Back surgery     Family History  Problem Relation Age of Onset  . Stroke Mother   . Early death Father    History  Substance Use Topics  . Smoking status: Former Smoker -- 0.10 packs/day for 55 years    Types: Cigarettes  . Smokeless tobacco: Never Used     Comment: pt statest he smokes about "once a month"   . Alcohol Use: Yes     Comment: RARE    Review of Systems  A complete 10 system review of systems was obtained and all systems are negative except as noted in the HPI and PMH.   Allergies  Ciprofloxacin  and Other  Home Medications   Current Outpatient Rx  Name  Route  Sig  Dispense  Refill  . amLODipine (NORVASC) 10 MG tablet   Oral   Take 1 tablet (10 mg total) by mouth daily.   30 tablet   1   . atorvastatin (LIPITOR) 20 MG tablet   Oral   Take 20 mg by mouth daily. TAKE ONE TABLET BY MOUTH DAILY         . citalopram (CELEXA) 20 MG tablet      TAKE 1 TABLET (20 MG TOTAL) BY MOUTH DAILY.   30 tablet   2     CYCLE FILL MEDICATION. Authorization is required f ...   . clotrimazole-betamethasone (LOTRISONE) cream   Topical   Apply 1 application topically 2 (two) times daily. For psoriasis.         Marland Kitchen diclofenac sodium (VOLTAREN) 1 % GEL   Topical   Apply 1 application topically 2 (two) times daily as needed. Arthritis in both thumbs.         . ezetimibe (ZETIA) 10 MG tablet   Oral   Take 10 mg by mouth daily.         . iron polysaccharides (NIFEREX) 150 MG capsule   Oral   Take 150 mg by mouth 2 (two) times daily.         Marland Kitchen lisinopril (PRINIVIL,ZESTRIL) 10 MG tablet   Oral   Take 1 tablet (10 mg total) by mouth 2 (two) times daily.   60 tablet   1   . methotrexate (RHEUMATREX) 2.5 MG tablet      TAKE FOUR TABLETS BY MOUTH ONCE A WEEK.   16 tablet   9     CYCLE FILL MEDICATION. Authorization is required f ...   . methotrexate (RHEUMATREX) 2.5 MG tablet      TAKE FOUR TABLETS BY MOUTH ONCE A WEEK.   16 tablet   9     CYCLE FILL MEDICATION. Authorization is required f ...   . MYRBETRIQ 50 MG TB24 tablet   Oral   Take 50 mg by mouth daily.          Marland Kitchen oseltamivir (TAMIFLU) 75 MG capsule   Oral   Take 1 capsule (75 mg total) by mouth every 12 (twelve) hours.   10 capsule   0   . pantoprazole (PROTONIX) 40 MG tablet   Oral   Take 1 tablet (40 mg total) by mouth daily.   30 tablet   3   .  predniSONE (DELTASONE) 5 MG tablet   Oral   Take 1.5 tablets (7.5 mg total) by mouth daily.   45 tablet   11   . Rivaroxaban (XARELTO) 20 MG TABS  tablet   Oral   Take 1 tablet (20 mg total) by mouth daily with supper. This is the new medication for stroke prevention   30 tablet   6   . rOPINIRole (REQUIP) 2 MG tablet               . solifenacin (VESICARE) 10 MG tablet   Oral   Take 1 tablet (10 mg total) by mouth daily. Use vesicare or ditropan--not both         . triamcinolone cream (KENALOG) 0.1 %      APPLY TO AFFECTED AREAS AS DIRECTED   480 g   3     CYCLE FILL MEDICATION. Authorization is required f ...    Triage Vitals: BP 140/62  Pulse 81  Temp(Src) 98.1 F (36.7 C) (Oral)  Resp 18  SpO2 99%  Physical Exam  Nursing note and vitals reviewed. Constitutional: He is oriented to person, place, and time. He appears well-developed and well-nourished. No distress.  HENT:  Head: Normocephalic and atraumatic.  Eyes: Conjunctivae are normal.  Neck: Normal range of motion. Neck supple.  Cardiovascular: Normal rate, regular rhythm, normal heart sounds and intact distal pulses.   Pulmonary/Chest: Effort normal and breath sounds normal. No respiratory distress.  Abdominal: He exhibits no distension.  Musculoskeletal: Normal range of motion.  Neurological: He is alert and oriented to person, place, and time.  Skin: Skin is warm and dry.  Psychiatric: He has a normal mood and affect. His behavior is normal.    ED Course  Procedures (including critical care time)  DIAGNOSTIC STUDIES: Oxygen Saturation is 99% on room air, normal by my interpretation.    COORDINATION OF CARE: 4:14 PM- Discussed that symptoms are likely viral in nature. Will discharge patient with [facility] flu. Discussed treatment plan with patient at bedside and patient verbalized agreement.     Labs Review Labs Reviewed - No data to display Imaging Review No results found.  EKG Interpretation   None       MDM   1. URI (upper respiratory infection)    I personally performed the services described in this documentation, which was  scribed in my presence. The recorded information has been reviewed and considered.  Pt is well appearing.  Symptoms for about 12 hours only.  Discussed the lack of symptoms to suggest that this is influenza and that flue testing was not done at this point except for patient meeting certain criteria.  Pt was not ill enough to warrant risk of taking antivirals at this point given recent literature, however, pt and spouse seemed to highly desire some kind of treatment  They were agreeable to take Rx home and watch symptoms, if they worsened severely in the next 24 hours, could fill tamiflu Rx at that time.     Christopher Baker. Leslieanne Cobarrubias, MD 06/17/13 1235

## 2013-06-16 NOTE — ED Notes (Signed)
Patient here with 1 day of congestion, cough, chills and bodyaches. Reports chest hurts with the cough. No distress

## 2013-06-17 ENCOUNTER — Encounter (HOSPITAL_BASED_OUTPATIENT_CLINIC_OR_DEPARTMENT_OTHER): Payer: Self-pay | Admitting: Emergency Medicine

## 2013-06-18 ENCOUNTER — Encounter: Payer: Medicare Other | Admitting: Occupational Therapy

## 2013-06-18 ENCOUNTER — Telehealth: Payer: Self-pay | Admitting: Internal Medicine

## 2013-06-18 NOTE — Telephone Encounter (Signed)
[name] reports pt feeling light-headed this morning when he was sitting on the side of the bed trying to tie his shoes. BP was 113/40, she states his normal is 130s/60-70s.  He has a cold and maybe a low grade fever according to pt's wife. Advised pt's wife to contact Dr. York Ram office (as they prescribe his hypertensive medications) for advice. I advised them to call back if symptoms worsen/no result from Dr.Jjenkins office. She is agreeable to plan.

## 2013-06-18 NOTE — Telephone Encounter (Signed)
New message  Patients BP is running low 113/40, wife wants to know if she should bring him in. Please call and advise.

## 2013-06-19 ENCOUNTER — Other Ambulatory Visit: Payer: Self-pay | Admitting: *Deleted

## 2013-06-19 ENCOUNTER — Ambulatory Visit (INDEPENDENT_AMBULATORY_CARE_PROVIDER_SITE_OTHER): Payer: Medicare Other | Admitting: Internal Medicine

## 2013-06-19 ENCOUNTER — Encounter: Payer: Self-pay | Admitting: Internal Medicine

## 2013-06-19 VITALS — BP 106/54 | HR 83 | Temp 98.7°F | Wt 224.0 lb

## 2013-06-19 DIAGNOSIS — R6889 Other general symptoms and signs: Secondary | ICD-10-CM

## 2013-06-19 DIAGNOSIS — J111 Influenza due to unidentified influenza virus with other respiratory manifestations: Secondary | ICD-10-CM | POA: Insufficient documentation

## 2013-06-19 DIAGNOSIS — R42 Dizziness and giddiness: Secondary | ICD-10-CM | POA: Insufficient documentation

## 2013-06-19 DIAGNOSIS — R031 Nonspecific low blood-pressure reading: Secondary | ICD-10-CM | POA: Insufficient documentation

## 2013-06-19 DIAGNOSIS — Z79899 Other long term (current) drug therapy: Secondary | ICD-10-CM

## 2013-06-19 LAB — POCT INFLUENZA A/B: Influenza B, POC: POSITIVE

## 2013-06-19 MED ORDER — LISINOPRIL 10 MG PO TABS: 10.0000 mg | ORAL_TABLET | Freq: Two times a day (BID) | ORAL | Status: DC

## 2013-06-19 NOTE — Progress Notes (Signed)
Pre visit review using our clinic review tool, if applicable. No additional management support is needed unless otherwise documented below in the visit note. Chief Complaint  Patient presents with  . Low BP    Low BP started yesterday and lasted through the evening.    HPI: Patient comes in today for SDA for  new problem evaluation. pcp Dr Shela Commons  Here with wife helps with hx  Also cardiology   yeasterday 105/62 and light headed and woozy   ;Went to  [facility] for Skin cancer  ;113/40 pulse 105   Last night was 138/62 This am  106/54   Wife had flu this past week with bed ridden  shaking chills fever and now better.; yesterday he developed cough cold symptoms has been seen in the urgent care and was given a Tamiflu prescription in case but not taken.  And today had fever 100.1  Robu=itussin dm about everuy 4 yhours and also an over-the-counter cold medicine.  States that he has intermittent lightheadedness when he is getting up from a chair but no true vertigo syncope chest pain shortness of breath. Nose congestion no chest pain hemoptysis.  ROS: See pertinent positives and negatives per HPI.no vomitng rash  Feels tired no syncope   History of CVA felt to be thromboembolic  in October placed on lisinopril and amlodipine raised from 5 mg to 10 mg at that time. Last laboratory studies earlier in the month stable.  Did not receive flu vaccine this year   Past Medical History  Diagnosis Date  . Hyperlipidemia   . HTN (hypertension)   . Status post placement of cardiac pacemaker DDD-  06-04-10-- LAST CHECK 09-08-10 IN EPIC    SECONDARY TO SYNCOPY AND BRADYCARDIA  . Restless leg syndrome   . Normal nuclear stress test 2008    LOW RISK--   DR  WALL  . Carotid stenosis, bilateral PER DR EARLY / DUPLEX  09-09-10      40 - 59%   BILATERALLY--- ASYMPTOMATIC  . History of echocardiogram 11-21-2006    EF 65%  . History of prostate cancer S/P RADIATION  TX  6 YRS AGO  . Psoriasis   . Arthritis  with psoriasis   . GERD (gastroesophageal reflux disease)     CONTROLLED W/ PROTONIX  . Borderline diabetes     DIET CONTROLLED - PT STATES HE IS NOT DIABETIC  . Lumbar spondylosis W/ RADICULOPATHY  . Iron deficiency   . Cataract immature LEFT EYE  . PAF (paroxysmal atrial fibrillation)   . SVT (supraventricular tachycardia)     S/P ABLATION   2008  . Coronary artery disease CARDIOLOGIST- DR Sharrell Ku    09-08-10 VISIT AND PACEMAKER CHECK  IN EPIC  . Cancer     HX OF PROSTATE CANCER--TX'D WITH RADIATION  . Sleep apnea TESTED YRS AGO-- NO CPAP RX GIVEN    PT STATES HE DOES NOT THINK HE STILL HAS SLEEP APNEA--DOES NOT USE CPAP  . Stroke     Family History  Problem Relation Age of Onset  . Stroke Mother   . Early death Father     History   Social History  . Marital Status: Married    Spouse Name: N/A    Number of Children: N/A  . Years of Education: N/A   Occupational History  . retired    Social History Main Topics  . Smoking status: Former Smoker -- 0.10 packs/day for 55 years    Types: Cigarettes  .  Smokeless tobacco: Never Used     Comment: pt statest he smokes about "once a month"   . Alcohol Use: Yes     Comment: RARE  . Drug Use: No  . Sexual Activity: Yes   Other Topics Concern  . None   Social History Narrative  . None    Outpatient Encounter Prescriptions as of 06/19/2013  Medication Sig  . amLODipine (NORVASC) 10 MG tablet Take 1 tablet (10 mg total) by mouth daily.  Marland Kitchen atorvastatin (LIPITOR) 20 MG tablet Take 20 mg by mouth daily. TAKE ONE TABLET BY MOUTH DAILY  . citalopram (CELEXA) 20 MG tablet TAKE 1 TABLET (20 MG TOTAL) BY MOUTH DAILY.  . clotrimazole-betamethasone (LOTRISONE) cream Apply 1 application topically 2 (two) times daily. For psoriasis.  Marland Kitchen diclofenac sodium (VOLTAREN) 1 % GEL Apply 1 application topically 2 (two) times daily as needed. Arthritis in both thumbs.  . ezetimibe (ZETIA) 10 MG tablet Take 10 mg by mouth daily.  . iron  polysaccharides (NIFEREX) 150 MG capsule Take 150 mg by mouth 2 (two) times daily.  . methotrexate (RHEUMATREX) 2.5 MG tablet TAKE FOUR TABLETS BY MOUTH ONCE A WEEK.  . methotrexate (RHEUMATREX) 2.5 MG tablet TAKE FOUR TABLETS BY MOUTH ONCE A WEEK.  Marland Kitchen MYRBETRIQ 50 MG TB24 tablet Take 50 mg by mouth daily.   . pantoprazole (PROTONIX) 40 MG tablet Take 1 tablet (40 mg total) by mouth daily.  Marland Kitchen POLY-IRON 150 150 MG capsule TAKE ONE CAPSULE BY MOUTH TWICE DAILY  . predniSONE (DELTASONE) 5 MG tablet Take 1.5 tablets (7.5 mg total) by mouth daily.  . Rivaroxaban (XARELTO) 20 MG TABS tablet Take 1 tablet (20 mg total) by mouth daily with supper. This is the new medication for stroke prevention  . rOPINIRole (REQUIP) 2 MG tablet   . solifenacin (VESICARE) 10 MG tablet Take 1 tablet (10 mg total) by mouth daily. Use vesicare or ditropan--not both  . triamcinolone cream (KENALOG) 0.1 % APPLY TO AFFECTED AREAS AS DIRECTED  . [DISCONTINUED] lisinopril (PRINIVIL,ZESTRIL) 10 MG tablet Take 1 tablet (10 mg total) by mouth 2 (two) times daily.  Marland Kitchen oseltamivir (TAMIFLU) 75 MG capsule Take 1 capsule (75 mg total) by mouth every 12 (twelve) hours.    EXAM:  BP 106/54  Pulse 83  Temp(Src) 98.7 F (37.1 C) (Oral)  Wt 224 lb (101.606 kg)  SpO2 97%  Body mass index is 28 kg/(m^2). Repeat BP 135/60  WDWN in NAD  quiet respirations; mildly congested  somewhat hoarse. Non toxic . Verbal  No resp distress runnyu nose  Nl interaction and speech HEENT: Normocephalic ;atraumatic , Eyes;  PERRL lids and conjunctiva clear,,Ears: no deformities, canals nl, TM landmarks normal, Nose: no deformity clear  discharge  congested;face  Non tender Mouth : OP clear without lesion or edema .tongue midline  Neck: Supple without adenopathy or masses Chest:  Clear to A&P without wheezes rales or rhonchi CV:  S1-S2 no gallops or murmurs peripheral perfusion is normal Skin :nl perfusion and no acute rashes  Dressing patch on forehead   Ambulatory  PSYCH: pleasant and cooperative, no obvious depression or anxiety  ASSESSMENT AND PLAN:  Discussed the following assessment and plan:  Flu-like symptoms - no vaccine  wife had fever flu like illness recovering  - Plan: POCT Influenza A/B  Influenza with respiratory manifestations - pos poct a and b?  Intermittent lightheadedness  Low blood pressure, not hypotension - requires close observation cause of risk but could be  meds and illness  noeevidence of sepsis or other omnious findings .   High risk medication use - xeralto  mtx etc   -Patient advised to return or notify health care team  if symptoms worsen or persist or new concerns arise.  Patient Instructions  Suspect flu based on context and symptoms and positive test . . With fever cough congestion body aches and exposure to spouse with eye fever body aches for 5 days. Stop all cold and flu medications unless it is plain Mucinex or Tylenol. Some of these medicines can cause lightheadedness and medication interactions.  Tamiflu itself can cause dizziness and nausea but is probably worth the benefit over the risk.  We can consider decreasing or holding blood pressure medicine for a day or 2 if needed. Chest exam is clear today. Symptoms are getting worse contact the medical team for further instructions Meantime increase fluids and rest. Fever should be gone in 2-3 days.  Oseltamivir capsules What is this medicine? OSELTAMIVIR (os el TAM i vir) is an antiviral medicine. It is used to prevent and to treat some kinds of influenza or the flu. It will not work for colds or other viral infections. This medicine may be used for other purposes; ask your health care provider or pharmacist if you have questions. COMMON BRAND NAME(S): Tamiflu What should I tell my health care provider before I take this medicine? They need to know if you have any of the following conditions: -heart disease -immune system problems -kidney  disease -liver disease -lung disease -an unusual or allergic reaction to oseltamivir, other medicines, foods, dyes, or preservatives -pregnant or trying to get pregnant -breast-feeding How should I use this medicine? Take this medicine by mouth with a glass of water. Follow the directions on the prescription label. Start this medicine at the first sign of flu symptoms. You can take it with or without food. If it upsets your stomach, take it with food. Take your medicine at regular intervals. Do not take your medicine more often than directed. Take all of your medicine as directed even if you think you are better. Do not skip doses or stop your medicine early. Talk to your pediatrician regarding the use of this medicine in children. While this drug may be prescribed for children as young as 14 days for selected conditions, precautions do apply. Overdosage: If you think you have taken too much of this medicine contact a poison control center or emergency room at once. NOTE: This medicine is only for you. Do not share this medicine with others. What if I miss a dose? If you miss a dose, take it as soon as you remember. If it is almost time for your next dose (within 2 hours), take only that dose. Do not take double or extra doses. What may interact with this medicine? Interactions are not expected. This list may not describe all possible interactions. Give your health care provider a list of all the medicines, herbs, non-prescription drugs, or dietary supplements you use. Also tell them if you smoke, drink alcohol, or use illegal drugs. Some items may interact with your medicine. What should I watch for while using this medicine? Visit your doctor or health care professional for regular check ups. Tell your doctor if your symptoms do not start to get better or if they get worse. If you have the flu, you may be at an increased risk of developing seizures, confusion, or abnormal behavior. This occurs  early in the illness,  and more frequently in children and teens. These events are not common, but may result in accidental injury to the patient. Families and caregivers of patients should watch for signs of unusual behavior and contact a doctor or health care professional right away if the patient shows signs of unusual behavior. This medicine is not a substitute for the flu shot. Talk to your doctor each year about an annual flu shot. What side effects may I notice from receiving this medicine? Side effects that you should report to your doctor or health care professional as soon as possible: -allergic reactions like skin rash, itching or hives, swelling of the face, lips, or tongue -anxiety, confusion, unusual behavior -breathing problems -hallucination, loss of contact with reality -redness, blistering, peeling or loosening of the skin, including inside the mouth -seizures Side effects that usually do not require medical attention (report to your doctor or health care professional if they continue or are bothersome): -cough -diarrhea -dizziness -headache -nausea, vomiting -stomach pain This list may not describe all possible side effects. Call your doctor for medical advice about side effects. You may report side effects to FDA at 1-800-FDA-1088. Where should I keep my medicine? Keep out of the reach of children. Store at room temperature between 15 and 30 degrees C (59 and 86 degrees F). Throw away any unused medicine after the expiration date. NOTE: This sheet is a summary. It may not cover all possible information. If you have questions about this medicine, talk to your doctor, pharmacist, or health care provider.  2014, Elsevier/Gold Standard. (2011-06-11 19:43:38) Influenza, Adult Influenza ("the flu") is a viral infection of the respiratory tract. It occurs more often in winter months because people spend more time in close contact with one another. Influenza can make you feel very  sick. Influenza easily spreads from person to person (contagious). CAUSES  Influenza is caused by a virus that infects the respiratory tract. You can catch the virus by breathing in droplets from an infected person's cough or sneeze. You can also catch the virus by touching something that was recently contaminated with the virus and then touching your mouth, nose, or eyes. SYMPTOMS  Symptoms typically last 4 to 10 days and may include:  Fever.  Chills.  Headache, body aches, and muscle aches.  Sore throat.  Chest discomfort and cough.  Poor appetite.  Weakness or feeling tired.  Dizziness.  Nausea or vomiting. DIAGNOSIS  Diagnosis of influenza is often made based on your history and a physical exam. A nose or throat swab test can be done to confirm the diagnosis. RISKS AND COMPLICATIONS You may be at risk for a more severe case of influenza if you smoke cigarettes, have diabetes, have chronic heart disease (such as heart failure) or lung disease (such as asthma), or if you have a weakened immune system. Elderly people and pregnant women are also at risk for more serious infections. The most common complication of influenza is a lung infection (pneumonia). Sometimes, this complication can require emergency medical care and may be life-threatening. PREVENTION  An annual influenza vaccination (flu shot) is the best way to avoid getting influenza. An annual flu shot is now routinely recommended for all adults in the [place] TREATMENT  In mild cases, influenza goes away on its own. Treatment is directed at relieving symptoms. For more severe cases, your caregiver may prescribe antiviral medicines to shorten the sickness. Antibiotic medicines are not effective, because the infection is caused by a virus, not by bacteria. HOME  CARE INSTRUCTIONS  Only take over-the-counter or prescription medicines for pain, discomfort, or fever as directed by your caregiver.  Use a cool mist humidifier to  make breathing easier.  Get plenty of rest until your temperature returns to normal. This usually takes 3 to 4 days.  Drink enough fluids to keep your urine clear or pale yellow.  Cover your mouth and nose when coughing or sneezing, and wash your hands well to avoid spreading the virus.  Stay home from work or school until your fever has been gone for at least 1 full day. SEEK MEDICAL CARE IF:   You have chest pain or a deep cough that worsens or produces more mucus.  You have nausea, vomiting, or diarrhea. SEEK IMMEDIATE MEDICAL CARE IF:   You have difficulty breathing, shortness of breath, or your skin or nails turn bluish.  You have severe neck pain or stiffness.  You have a severe headache, facial pain, or earache.  You have a worsening or recurring fever.  You have nausea or vomiting that cannot be controlled. MAKE SURE YOU:  Understand these instructions.  Will watch your condition.  Will get help right away if you are not doing well or get worse. Document Released: 06/04/2000 Document Revised: 12/07/2011 Document Reviewed: 09/06/2011 [facility] Patient Information 2014 [place], [place].      Neta Mends. Panosh M.D.

## 2013-06-19 NOTE — Patient Instructions (Addendum)
Suspect flu based on context and symptoms and positive test . . With fever cough congestion body aches and exposure to spouse with eye fever body aches for 5 days. Stop all cold and flu medications unless it is plain Mucinex or Tylenol. Some of these medicines can cause lightheadedness and medication interactions.  Tamiflu itself can cause dizziness and nausea but is probably worth the benefit over the risk.  We can consider decreasing or holding blood pressure medicine for a day or 2 if needed. Chest exam is clear today. Symptoms are getting worse contact the medical team for further instructions Meantime increase fluids and rest. Fever should be gone in 2-3 days.  Oseltamivir capsules What is this medicine? OSELTAMIVIR (os el TAM i vir) is an antiviral medicine. It is used to prevent and to treat some kinds of influenza or the flu. It will not work for colds or other viral infections. This medicine may be used for other purposes; ask your health care provider or pharmacist if you have questions. COMMON BRAND NAME(S): Tamiflu What should I tell my health care provider before I take this medicine? They need to know if you have any of the following conditions: -heart disease -immune system problems -kidney disease -liver disease -lung disease -an unusual or allergic reaction to oseltamivir, other medicines, foods, dyes, or preservatives -pregnant or trying to get pregnant -breast-feeding How should I use this medicine? Take this medicine by mouth with a glass of water. Follow the directions on the prescription label. Start this medicine at the first sign of flu symptoms. You can take it with or without food. If it upsets your stomach, take it with food. Take your medicine at regular intervals. Do not take your medicine more often than directed. Take all of your medicine as directed even if you think you are better. Do not skip doses or stop your medicine early. Talk to your pediatrician  regarding the use of this medicine in children. While this drug may be prescribed for children as young as 14 days for selected conditions, precautions do apply. Overdosage: If you think you have taken too much of this medicine contact a poison control center or emergency room at once. NOTE: This medicine is only for you. Do not share this medicine with others. What if I miss a dose? If you miss a dose, take it as soon as you remember. If it is almost time for your next dose (within 2 hours), take only that dose. Do not take double or extra doses. What may interact with this medicine? Interactions are not expected. This list may not describe all possible interactions. Give your health care provider a list of all the medicines, herbs, non-prescription drugs, or dietary supplements you use. Also tell them if you smoke, drink alcohol, or use illegal drugs. Some items may interact with your medicine. What should I watch for while using this medicine? Visit your doctor or health care professional for regular check ups. Tell your doctor if your symptoms do not start to get better or if they get worse. If you have the flu, you may be at an increased risk of developing seizures, confusion, or abnormal behavior. This occurs early in the illness, and more frequently in children and teens. These events are not common, but may result in accidental injury to the patient. Families and caregivers of patients should watch for signs of unusual behavior and contact a doctor or health care professional right away if the patient shows signs of  unusual behavior. This medicine is not a substitute for the flu shot. Talk to your doctor each year about an annual flu shot. What side effects may I notice from receiving this medicine? Side effects that you should report to your doctor or health care professional as soon as possible: -allergic reactions like skin rash, itching or hives, swelling of the face, lips, or  tongue -anxiety, confusion, unusual behavior -breathing problems -hallucination, loss of contact with reality -redness, blistering, peeling or loosening of the skin, including inside the mouth -seizures Side effects that usually do not require medical attention (report to your doctor or health care professional if they continue or are bothersome): -cough -diarrhea -dizziness -headache -nausea, vomiting -stomach pain This list may not describe all possible side effects. Call your doctor for medical advice about side effects. You may report side effects to FDA at 1-800-FDA-1088. Where should I keep my medicine? Keep out of the reach of children. Store at room temperature between 15 and 30 degrees C (59 and 86 degrees F). Throw away any unused medicine after the expiration date. NOTE: This sheet is a summary. It may not cover all possible information. If you have questions about this medicine, talk to your doctor, pharmacist, or health care provider.  2014, Elsevier/Gold Standard. (2011-06-11 19:43:38) Influenza, Adult Influenza ("the flu") is a viral infection of the respiratory tract. It occurs more often in winter months because people spend more time in close contact with one another. Influenza can make you feel very sick. Influenza easily spreads from person to person (contagious). CAUSES  Influenza is caused by a virus that infects the respiratory tract. You can catch the virus by breathing in droplets from an infected person's cough or sneeze. You can also catch the virus by touching something that was recently contaminated with the virus and then touching your mouth, nose, or eyes. SYMPTOMS  Symptoms typically last 4 to 10 days and may include:  Fever.  Chills.  Headache, body aches, and muscle aches.  Sore throat.  Chest discomfort and cough.  Poor appetite.  Weakness or feeling tired.  Dizziness.  Nausea or vomiting. DIAGNOSIS  Diagnosis of influenza is often made  based on your history and a physical exam. A nose or throat swab test can be done to confirm the diagnosis. RISKS AND COMPLICATIONS You may be at risk for a more severe case of influenza if you smoke cigarettes, have diabetes, have chronic heart disease (such as heart failure) or lung disease (such as asthma), or if you have a weakened immune system. Elderly people and pregnant women are also at risk for more serious infections. The most common complication of influenza is a lung infection (pneumonia). Sometimes, this complication can require emergency medical care and may be life-threatening. PREVENTION  An annual influenza vaccination (flu shot) is the best way to avoid getting influenza. An annual flu shot is now routinely recommended for all adults in the [place] TREATMENT  In mild cases, influenza goes away on its own. Treatment is directed at relieving symptoms. For more severe cases, your caregiver may prescribe antiviral medicines to shorten the sickness. Antibiotic medicines are not effective, because the infection is caused by a virus, not by bacteria. HOME CARE INSTRUCTIONS  Only take over-the-counter or prescription medicines for pain, discomfort, or fever as directed by your caregiver.  Use a cool mist humidifier to make breathing easier.  Get plenty of rest until your temperature returns to normal. This usually takes 3 to 4 days.  Drink enough fluids to keep your urine clear or pale yellow.  Cover your mouth and nose when coughing or sneezing, and wash your hands well to avoid spreading the virus.  Stay home from work or school until your fever has been gone for at least 1 full day. SEEK MEDICAL CARE IF:   You have chest pain or a deep cough that worsens or produces more mucus.  You have nausea, vomiting, or diarrhea. SEEK IMMEDIATE MEDICAL CARE IF:   You have difficulty breathing, shortness of breath, or your skin or nails turn bluish.  You have severe neck pain or  stiffness.  You have a severe headache, facial pain, or earache.  You have a worsening or recurring fever.  You have nausea or vomiting that cannot be controlled. MAKE SURE YOU:  Understand these instructions.  Will watch your condition.  Will get help right away if you are not doing well or get worse. Document Released: 06/04/2000 Document Revised: 12/07/2011 Document Reviewed: 09/06/2011 [facility] Patient Information 2014 [place], [place].

## 2013-06-22 ENCOUNTER — Emergency Department (INDEPENDENT_AMBULATORY_CARE_PROVIDER_SITE_OTHER): Payer: Medicare PPO

## 2013-06-22 ENCOUNTER — Emergency Department (HOSPITAL_COMMUNITY)
Admission: EM | Admit: 2013-06-22 | Discharge: 2013-06-22 | Disposition: A | Discharge status: Home / Self Care | Payer: Medicare PPO | Source: Home / Self Care

## 2013-06-22 ENCOUNTER — Encounter (HOSPITAL_COMMUNITY): Payer: Self-pay | Admitting: Emergency Medicine

## 2013-06-22 ENCOUNTER — Ambulatory Visit: Payer: Commercial Managed Care - HMO | Admitting: Physical Medicine & Rehabilitation

## 2013-06-22 DIAGNOSIS — J45909 Unspecified asthma, uncomplicated: Secondary | ICD-10-CM

## 2013-06-22 DIAGNOSIS — J40 Bronchitis, not specified as acute or chronic: Secondary | ICD-10-CM

## 2013-06-22 MED ORDER — IPRATROPIUM BROMIDE 0.06 % NA SOLN: 2.0000 | Freq: Four times a day (QID) | NASAL | Status: DC

## 2013-06-22 MED ORDER — IPRATROPIUM-ALBUTEROL 0.5-2.5 (3) MG/3ML IN SOLN
RESPIRATORY_TRACT | Status: AC
  Filled 2013-06-22: qty 3

## 2013-06-22 MED ORDER — IPRATROPIUM-ALBUTEROL 0.5-2.5 (3) MG/3ML IN SOLN
3.0000 mL | Freq: Once | RESPIRATORY_TRACT | Status: AC
  Administered 2013-06-22: 3 mL via RESPIRATORY_TRACT

## 2013-06-22 MED ORDER — ALBUTEROL SULFATE HFA 108 (90 BASE) MCG/ACT IN AERS: 1.0000 | INHALATION_SPRAY | Freq: Four times a day (QID) | RESPIRATORY_TRACT | Status: DC | PRN

## 2013-06-22 NOTE — ED Provider Notes (Signed)
CSN: [id number]     Arrival date & time 06/22/13  1142 History   First MD Initiated Contact with Patient 06/22/13 1316     Chief Complaint  Patient presents with  . Influenza   (Consider location/radiation/quality/duration/timing/severity/associated sxs/prior Treatment) HPI Comments: 80 year old male presents with a chief complaint of burning in the substernal area when coughing.  6 days ago he went to the emergency department with flulike symptoms but did not meet criteria for flu testing. Per patient request he was given the Tamiflu prescription. He later went to his PCP in the office test was positive for flu. He has taken his Tamiflu. He states he feels better in general this concerned about the burning with cough. He also states that when he is asleep her supine he feels a rattle in his upper chest.   Past Medical History  Diagnosis Date  . Hyperlipidemia   . HTN (hypertension)   . Status post placement of cardiac pacemaker DDD-  06-04-10-- LAST CHECK 09-08-10 IN EPIC    SECONDARY TO SYNCOPY AND BRADYCARDIA  . Restless leg syndrome   . Normal nuclear stress test 2008    LOW RISK--   DR  WALL  . Carotid stenosis, bilateral PER DR EARLY / DUPLEX  09-09-10      40 - 59%   BILATERALLY--- ASYMPTOMATIC  . History of echocardiogram 11-21-2006    EF 65%  . History of prostate cancer S/P RADIATION  TX  6 YRS AGO  . Psoriasis   . Arthritis with psoriasis   . GERD (gastroesophageal reflux disease)     CONTROLLED W/ PROTONIX  . Borderline diabetes     DIET CONTROLLED - PT STATES HE IS NOT DIABETIC  . Lumbar spondylosis W/ RADICULOPATHY  . Iron deficiency   . Cataract immature LEFT EYE  . PAF (paroxysmal atrial fibrillation)   . SVT (supraventricular tachycardia)     S/P ABLATION   2008  . Coronary artery disease CARDIOLOGIST- DR Crissie Sickles    09-08-10 VISIT AND PACEMAKER CHECK  IN EPIC  . Cancer     HX OF PROSTATE CANCER--TX'D WITH RADIATION  . Sleep apnea TESTED YRS AGO-- NO CPAP  RX GIVEN    PT STATES HE DOES NOT THINK HE STILL HAS SLEEP APNEA--DOES NOT USE CPAP  . Stroke    Past Surgical History  Procedure Laterality Date  . Replacement total knee  1997    RIGHT  . Cardiac pacemaker placement  06-04-10    DDD/ AND REMOVAL LOOR RECORDER  . Loop recorder placement  01-30-10  . Lumbar laminectomy/ diskectomy/ fusion  05-19-10    L4 - 5  . Cholecystectomy  2009  . Cardiac electrophysiology study and ablation  2008    FOR SVT  . Prostate biopsy  2007  . Transurethral resection of prostate  2006  . Inguinal hernia repair  2005    LEFT/   DONE WITH PENILE PROSTESIS SURG.  . Removal and placement penile prosthesis implant   2005    AND LEFT CORPOROPLASTY /    DONE INGUINAL REPAIR  . Penile prosthesis implant  1995  . Knee arthroscopy  06/02/2011    Procedure: ARTHROSCOPY KNEE;  Surgeon: Gearlean Alf;  Location: [facility];  Service: Orthopedics;  Laterality: Left;  LEFT KNEE ARTHROSCOPY WITH DEBRIDEMENT  . Total knee arthroplasty  12/20/2011    Procedure: TOTAL KNEE ARTHROPLASTY;  Surgeon: Gearlean Alf, MD;  Location: [facility];  Service: Orthopedics;  Laterality:  Left;  . Back surgery     Family History  Problem Relation Age of Onset  . Stroke Mother   . Early death Father    History  Substance Use Topics  . Smoking status: Former Smoker -- 0.10 packs/day for 55 years    Types: Cigarettes  . Smokeless tobacco: Never Used     Comment: pt statest he smokes about "once a month"   . Alcohol Use: Yes     Comment: RARE    Review of Systems  Constitutional: Positive for activity change. Negative for fever, diaphoresis and fatigue.  HENT: Positive for congestion, postnasal drip, sore throat and trouble swallowing. Negative for ear pain, facial swelling and rhinorrhea.   Eyes: Negative for pain, discharge and redness.  Respiratory: Positive for cough. Negative for chest tightness and shortness of breath.   Cardiovascular: Negative.    Gastrointestinal: Negative.   Musculoskeletal: Negative.  Negative for neck pain and neck stiffness.  Neurological: Negative.     Allergies  Ciprofloxacin and Other  Home Medications   Current Outpatient Rx  Name  Route  Sig  Dispense  Refill  . albuterol (PROVENTIL HFA;VENTOLIN HFA) 108 (90 BASE) MCG/ACT inhaler   Inhalation   Inhale 1-2 puffs into the lungs every 6 (six) hours as needed for wheezing or shortness of breath.   1 Inhaler   0   . amLODipine (NORVASC) 10 MG tablet   Oral   Take 1 tablet (10 mg total) by mouth daily.   30 tablet   1   . atorvastatin (LIPITOR) 20 MG tablet   Oral   Take 20 mg by mouth daily. TAKE ONE TABLET BY MOUTH DAILY         . citalopram (CELEXA) 20 MG tablet      TAKE 1 TABLET (20 MG TOTAL) BY MOUTH DAILY.   30 tablet   2     CYCLE FILL MEDICATION. Authorization is required f ...   . clotrimazole-betamethasone (LOTRISONE) cream   Topical   Apply 1 application topically 2 (two) times daily. For psoriasis.         Marland Kitchen diclofenac sodium (VOLTAREN) 1 % GEL   Topical   Apply 1 application topically 2 (two) times daily as needed. Arthritis in both thumbs.         . ezetimibe (ZETIA) 10 MG tablet   Oral   Take 10 mg by mouth daily.         Marland Kitchen ipratropium (ATROVENT) 0.06 % nasal spray   Each Nare   Place 2 sprays into both nostrils 4 (four) times daily.   15 mL   12   . iron polysaccharides (NIFEREX) 150 MG capsule   Oral   Take 150 mg by mouth 2 (two) times daily.         Marland Kitchen lisinopril (PRINIVIL,ZESTRIL) 10 MG tablet   Oral   Take 1 tablet (10 mg total) by mouth 2 (two) times daily.   180 tablet   3   . methotrexate (RHEUMATREX) 2.5 MG tablet      TAKE FOUR TABLETS BY MOUTH ONCE A WEEK.   16 tablet   9     CYCLE FILL MEDICATION. Authorization is required f ...   . methotrexate (RHEUMATREX) 2.5 MG tablet      TAKE FOUR TABLETS BY MOUTH ONCE A WEEK.   16 tablet   9     CYCLE FILL MEDICATION. Authorization  is required f ...   . MYRBETRIQ  50 MG TB24 tablet   Oral   Take 50 mg by mouth daily.          Marland Kitchen oseltamivir (TAMIFLU) 75 MG capsule   Oral   Take 1 capsule (75 mg total) by mouth every 12 (twelve) hours.   10 capsule   0   . pantoprazole (PROTONIX) 40 MG tablet   Oral   Take 1 tablet (40 mg total) by mouth daily.   30 tablet   3   . POLY-IRON 150 150 MG capsule      TAKE ONE CAPSULE BY MOUTH TWICE DAILY   60 capsule   9     CYCLE FILL MEDICATION. Authorization is required f ...   . predniSONE (DELTASONE) 5 MG tablet   Oral   Take 1.5 tablets (7.5 mg total) by mouth daily.   45 tablet   11   . Rivaroxaban (XARELTO) 20 MG TABS tablet   Oral   Take 1 tablet (20 mg total) by mouth daily with supper. This is the new medication for stroke prevention   30 tablet   6   . rOPINIRole (REQUIP) 2 MG tablet               . solifenacin (VESICARE) 10 MG tablet   Oral   Take 1 tablet (10 mg total) by mouth daily. Use vesicare or ditropan--not both         . triamcinolone cream (KENALOG) 0.1 %      APPLY TO AFFECTED AREAS AS DIRECTED   480 g   3     CYCLE FILL MEDICATION. Authorization is required f ...    BP 136/71  Pulse 77  Temp(Src) 98 F (36.7 C) (Oral)  Resp 16  SpO2 99% Physical Exam  Nursing note and vitals reviewed. Constitutional: He is oriented to person, place, and time. He appears well-developed and well-nourished. No distress.  Appears generally well and exhibiting no observed symptoms during the interview.  HENT:  Mouth/Throat: No oropharyngeal exudate.  Bilateral TMs are normal Oropharynx with erythema and a copious amount of opaque PND and cobblestoning.  Eyes: Conjunctivae and EOM are normal.  Neck: Normal range of motion. Neck supple.  Cardiovascular: Normal rate, regular rhythm and normal heart sounds.   Pulmonary/Chest: Effort normal. No respiratory distress. He has wheezes. He has no rales.  Musculoskeletal: Normal range of motion.  He exhibits no edema.  Lymphadenopathy:    He has no cervical adenopathy.  Neurological: He is alert and oriented to person, place, and time.  Skin: Skin is warm and dry. No rash noted.  Psychiatric: He has a normal mood and affect.    ED Course  Procedures (including critical care time) Labs Review Labs Reviewed - No data to display Imaging Review Dg Chest 2 View  06/22/2013   CLINICAL DATA:  Diagnosed with flu 2 days ago, increasingly severe cough with chest pain in the retrosternal area  EXAM: CHEST  2 VIEW  COMPARISON:  04/03/2013  FINDINGS: Mild cardiac enlargement is stable. Cardiac pacer is in unchanged position. The vascular pattern is normal. The lungs are clear. There are no pleural effusions. On the lateral view, there is no abnormal opacity in the retrosternal area.  IMPRESSION: No active cardiopulmonary disease.   Electronically Signed   By: Skipper Cliche M.D.   On: 06/22/2013 14:08      MDM   1. RAD (reactive airway disease) with wheezing   2. Bronchitis  Post duo neb patient states she is breathing better and feels better. Auscultation reveals significantly decreased wheezing, improved air movement. Albuterol HFA 2 puffs every 4 hours when necessary Atrovent nasal spray as directed Tylenol every 4 hours when necessary Allegra 180 mg q d for drainage. For worsening new symptoms or problems may return or see your PCP  Janne Napoleon, NP 06/22/13 1435

## 2013-06-22 NOTE — ED Notes (Signed)
Patient transported to X-ray 

## 2013-06-22 NOTE — Discharge Instructions (Signed)
Bronchospasm, Adult A bronchospasm is when the tubes that carry air in and out of your lungs (airwarys) spasm or tighten. During a bronchospasm it is hard to breathe. This is because the airways get smaller. A bronchospasm can be triggered by:  Allergies. These may be to animals, pollen, food, or mold.  Infection. This is a common cause of bronchospasm.  Exercise.  Irritants. These include pollution, cigarette smoke, strong odors, aerosol sprays, and paint fumes.  Weather changes.  Stress.  Being emotional. HOME CARE   Always have a plan for getting help. Know when to call your doctor and local emergency services (911 in the [place] Know where you can get emergency care.  Only take medicines as told by your doctor.  If you were prescribed an inhaler or nebulizer machine, ask your doctor how to use it correctly. Always use a spacer with your inhaler if you were given one.  Stay calm during an attack. Try to relax and breathe more slowly.  Control your home environment:  Change your heating and air conditioning filter at least once a month.  Limit your use of fireplaces and wood stoves.  Do not  smoke. Do not  allow smoking in your home.  Avoid perfumes and fragrances.  Get rid of pests (such as roaches and mice) and their droppings.  Throw away plants if you see mold on them.  Keep your house clean and dust free.  Replace carpet with wood, tile, or vinyl flooring. Carpet can trap dander and dust.  Use allergy-proof pillows, mattress covers, and box spring covers.  Wash bed sheets and blankets every week in hot water. Dry them in a dryer.  Use blankets that are made of polyester or cotton.  Wash hands frequently. GET HELP IF:  You have muscle aches.  You have chest pain.  The thick spit you spit or cough up (sputum) changes from clear or white to yellow, green, gray, or bloody.  The thick spit you spit or cough up gets thicker.  There are problems that may be  related to the medicine you are given such as:  A rash.  Itching.  Swelling.  Trouble breathing. GET HELP RIGHT AWAY IF:  You feel you cannot breathe or catch your breath.  You cannot stop coughing.  Your treatment is not helping you breathe better. MAKE SURE YOU:   Understand these instructions.  Will watch your condition.  Will get help right away if you are not doing well or get worse. Document Released: 04/04/2009 Document Revised: 02/07/2013 Document Reviewed: 11/28/2012 [facility] Patient Information 2014 [place].  Bronchitis Bronchitis is the body's way of reacting to injury and/or infection (inflammation) of the bronchi. Bronchi are the air tubes that extend from the windpipe into the lungs. If the inflammation becomes severe, it may cause shortness of breath. CAUSES  Inflammation may be caused by:  A virus.  Germs (bacteria).  Dust.  Allergens.  Pollutants and many other irritants. The cells lining the bronchial tree are covered with tiny hairs (cilia). These constantly beat upward, away from the lungs, toward the mouth. This keeps the lungs free of pollutants. When these cells become too irritated and are unable to do their job, mucus begins to develop. This causes the characteristic cough of bronchitis. The cough clears the lungs when the cilia are unable to do their job. Without either of these protective mechanisms, the mucus would settle in the lungs. Then you would develop pneumonia. Smoking is a common cause  of bronchitis and can contribute to pneumonia. Stopping this habit is the single most important thing you can do to help yourself. TREATMENT   Your caregiver may prescribe an antibiotic if the cough is caused by bacteria. Also, medicines that open up your airways make it easier to breathe. Your caregiver may also recommend or prescribe an expectorant. It will loosen the mucus to be coughed up. Only take over-the-counter or prescription medicines  for pain, discomfort, or fever as directed by your caregiver.  Removing whatever causes the problem (smoking, for example) is critical to preventing the problem from getting worse.  Cough suppressants may be prescribed for relief of cough symptoms.  Inhaled medicines may be prescribed to help with symptoms now and to help prevent problems from returning.  For those with recurrent (chronic) bronchitis, there may be a need for steroid medicines. SEEK IMMEDIATE MEDICAL CARE IF:   During treatment, you develop more pus-like mucus (purulent sputum).  You have a fever.  You become progressively more ill.  You have increased difficulty breathing, wheezing, or shortness of breath. It is necessary to seek immediate medical care if you are elderly or sick from any other disease. MAKE SURE YOU:   Understand these instructions.  Will watch your condition.  Will get help right away if you are not doing well or get worse. Document Released: 06/07/2005 Document Revised: 02/07/2013 Document Reviewed: 01/30/2013 [facility] Patient Information 2014 [place].  Using Your Inhaler Proper inhaler technique is very important. Good technique assures that the medicine reaches the lungs. Poor technique results in depositing the medicine on the tongue and back of the throat rather than in the airways. STEPS TO FOLLOW IF USING INHALER WITHOUT EXTENSION TUBE: 1. Remove cap from inhaler. 2. Shake inhaler for 5 seconds before each inhalation (breathing in). 3. Position the inhaler so that the top of the canister faces up. 4. Put your index finger on the top of the medication canister. Your thumb supports the bottom of the inhaler. 5. Open your mouth. 6. Hold the inhaler 1 to 2 inches away from your open mouth. This allows the medicine to slow down before the medicine enters the mouth. 7. Exhale (breathe out) normally and as completely as possible. 8. Press the canister down with the index finger to  release the medication. 9. At the same time as the canister is pressed, inhale deeply and slowly until the lungs are completely filled. This should take 4 to 6 seconds. Keep your tongue down and out of the way. 10. Hold the medication in your lungs for up to 10 seconds (10 seconds is best). This helps the medicine get into the small airways of your lungs to work better. 11. Breathe out slowly, through pursed lips. Whistling is an example of pursed lips. 12. Wait at least 1 minute between puffs. Continue with the above steps until you have taken the number of puffs your caregiver has ordered. 13. Replace cap on inhaler. STEPS TO FOLLOW USING AN INHALER WITH AN EXTENSION (SPACER): 1.  Remove cap from inhaler. 2. Shake inhaler for 5 seconds before each inhalation (breathing in). 3. Your caregiver has asked you to use a spacer with your inhaler. A spacer is a plastic tube with a mouthpiece on one end and an opening that connects to the inhaler on the other end. A spacer helps you take the medicine better. 4. Place the open end of the spacer onto the mouthpiece of the inhaler. 5. Position the inhaler so that  the top of the canister faces up and the spacer mouthpiece faces you. 6. Put your index finger on the top of the medication canister. Your thumb supports the bottom of the inhaler and the spacer. 7. Exhale (breathe out) normally and as completely as possible. 8. Immediately after exhaling, place the spacer between your teeth and into your mouth. Close your mouth tightly around the spacer. 9. Press the canister down with the index finger to release the medication. 10. At the same time as the canister is pressed, inhale deeply and slowly until the lungs are completely filled. This should take 4 to 6 seconds. Keep your tongue down and out of the way. 11. Hold the medication in your lungs for up to 10 seconds (10 seconds is best). This helps the medicine get into the small airways of your lungs to work  better. Exhale. 12. Repeat inhaling deeply through the spacer mouthpiece. Again hold that breath for up to 10 seconds (10 seconds is best). Exhale slowly. If it is difficult to take this second deep breath through the spacer, breathe normally several times through the spacer. Remove the spacer from your mouth. 13. Wait at least 1 minute between puffs. Continue with the above steps until you have taken the number of puffs your caregiver has ordered. 14. Remove spacer from the inhaler and place cap on inhaler. If you are using different kinds of inhalers, use your quick relief medicine to open the airways 10 - 15 minutes before using a steroid. If you are unsure which inhalers to use and the order of using them, ask your caregiver, nurse, or respiratory therapist. If you are using a steroid inhaler, rinse your mouth with water after your last puff and then spit out the water. Do not swallow the water. Avoid the following:  Inhaling before or after starting the spray of medicine. It takes practice to coordinate your breathing with triggering the spray.  Inhaling through the nose (rather than the mouth) when triggering the spray. HOW TO DETERMINE IF YOUR INHALER IS FULL OR NEARLY EMPTY:  Determine when an inhaler is empty. You cannot know when an inhaler is empty by shaking it. A few inhalers are now being made with dose counters. Ask your caregiver for a prescription that has a dose counter if you feel you need that extra help.  If your inhaler does not have a counter, check the number of doses in the inhaler before you use it. The canister or box will list the number of doses in the canister. Divide the total number of doses in the canister by the number you will use each day to find how many days the canister will last. (For example, if your canister has 200 doses and you take 2 puffs, 4 times each day, which is 8 puffs a day. Dividing 200 by 8 equals 25. The canister should last 25 days.) Using a  calendar, count forward that many days to see when your inhaler will run out. Write the refill date on a calendar or your canister.  Remember, if you need to take extra doses, the inhaler will empty sooner than you figured. Be sure you have a refill before your canister runs out. Refill your inhaler 7 to 10 days before it runs out. HOME CARE INSTRUCTIONS   Do not use the inhaler more than your caregiver tells you. If you are still wheezing and are feeling tightness in your chest, call your caregiver.  Keep an adequate supply of  medication. This includes making sure the medicine is not expired, and you have a spare inhaler.  Follow your caregiver or inhaler insert directions for cleaning the inhaler and spacer. SEEK MEDICAL CARE IF:   Symptoms are only partially relieved with your inhaler.  You are having trouble using your inhaler.  You experience some increase in phlegm.  You develop a fever of 100.5 F (38.1 C). SEEK IMMEDIATE MEDICAL CARE IF:   You feel little or no relief with your inhalers. You are still wheezing and are feeling shortness of breath and/or tightness in your chest.  You have side effects such as dizziness, headaches or fast heart rate.  You have chills, fever, night sweats or an oral temperature above 102 F (38.9 C).  Phlegm production increases a lot, or there is blood in the phlegm. MAKE SURE YOU:   Understand these instructions.  Will watch your condition.  Will get help right away if you are not doing well or get worse. Document Released: 06/04/2000 Document Revised: 08/30/2011 Document Reviewed: 03/25/2009 [facility] Patient Information 2014 [place].  Upper Respiratory Infection, Adult An upper respiratory infection (URI) is also known as the common cold. It is often caused by a type of germ (virus). Colds are easily spread (contagious). You can pass it to others by kissing, coughing, sneezing, or drinking out of the same glass. Usually, you  get better in 1 or 2 weeks.  HOME CARE   Only take medicine as told by your doctor.  Use a warm mist humidifier or breathe in steam from a hot shower.  Drink enough water and fluids to keep your pee (urine) clear or pale yellow.  Get plenty of rest.  Return to work when your temperature is back to normal or as told by your doctor. You may use a face mask and wash your hands to stop your cold from spreading. GET HELP RIGHT AWAY IF:   After the first few days, you feel you are getting worse.  You have questions about your medicine.  You have chills, shortness of breath, or brown or red spit (mucus).  You have yellow or brown snot (nasal discharge) or pain in the face, especially when you bend forward.  You have a fever, puffy (swollen) neck, pain when you swallow, or white spots in the back of your throat.  You have a bad headache, ear pain, sinus pain, or chest pain.  You have a high-pitched whistling sound when you breathe in and out (wheezing).  You have a lasting cough or cough up blood.  You have sore muscles or a stiff neck. MAKE SURE YOU:   Understand these instructions.  Will watch your condition.  Will get help right away if you are not doing well or get worse. Document Released: 11/24/2007 Document Revised: 08/30/2011 Document Reviewed: 10/12/2010 [facility] Patient Information 2014 [place], [place].

## 2013-06-22 NOTE — ED Provider Notes (Signed)
Medical screening examination/treatment/procedure(s) were performed by resident physician or non-physician practitioner and as supervising physician I was immediately available for consultation/collaboration.   Pauline Good MD.   Billy Fischer, MD 06/22/13 [phone]

## 2013-06-22 NOTE — ED Notes (Signed)
Patient has center chest burning pain that is noted with coughing.  Minimal to no phlegm with cough.  Pain in chest for 2 days.

## 2013-06-26 ENCOUNTER — Telehealth: Payer: Self-pay | Admitting: Internal Medicine

## 2013-06-26 ENCOUNTER — Ambulatory Visit: Payer: Medicare PPO | Admitting: Occupational Therapy

## 2013-06-26 ENCOUNTER — Ambulatory Visit: Payer: Medicare PPO | Attending: Physical Medicine & Rehabilitation | Admitting: Speech Pathology

## 2013-06-26 ENCOUNTER — Other Ambulatory Visit: Payer: Self-pay | Admitting: *Deleted

## 2013-06-26 DIAGNOSIS — I69928 Other speech and language deficits following unspecified cerebrovascular disease: Secondary | ICD-10-CM | POA: Insufficient documentation

## 2013-06-26 DIAGNOSIS — Z5189 Encounter for other specified aftercare: Secondary | ICD-10-CM | POA: Insufficient documentation

## 2013-06-26 DIAGNOSIS — R279 Unspecified lack of coordination: Secondary | ICD-10-CM | POA: Insufficient documentation

## 2013-06-26 DIAGNOSIS — I639 Cerebral infarction, unspecified: Secondary | ICD-10-CM

## 2013-06-26 DIAGNOSIS — R41841 Cognitive communication deficit: Secondary | ICD-10-CM | POA: Insufficient documentation

## 2013-06-26 NOTE — Telephone Encounter (Signed)
Changed to [facility]  

## 2013-06-26 NOTE — Telephone Encounter (Signed)
Pt has switched to [facility] and needs all the RX's to go through [facility].

## 2013-06-29 ENCOUNTER — Other Ambulatory Visit: Payer: Self-pay | Admitting: *Deleted

## 2013-06-29 ENCOUNTER — Ambulatory Visit: Payer: Medicare PPO | Admitting: Occupational Therapy

## 2013-06-29 ENCOUNTER — Encounter: Payer: Self-pay | Admitting: Internal Medicine

## 2013-06-29 ENCOUNTER — Ambulatory Visit: Payer: Medicare PPO

## 2013-06-29 DIAGNOSIS — R55 Syncope and collapse: Secondary | ICD-10-CM

## 2013-06-29 DIAGNOSIS — I6350 Cerebral infarction due to unspecified occlusion or stenosis of unspecified cerebral artery: Secondary | ICD-10-CM

## 2013-06-29 DIAGNOSIS — K219 Gastro-esophageal reflux disease without esophagitis: Secondary | ICD-10-CM

## 2013-06-29 MED ORDER — AMLODIPINE BESYLATE 10 MG PO TABS: 10.0000 mg | ORAL_TABLET | Freq: Every day | ORAL | Status: DC

## 2013-06-29 MED ORDER — CLOTRIMAZOLE-BETAMETHASONE 1-0.05 % EX CREA: 1.0000 "application " | TOPICAL_CREAM | Freq: Two times a day (BID) | CUTANEOUS | Status: DC

## 2013-06-29 MED ORDER — ALBUTEROL SULFATE HFA 108 (90 BASE) MCG/ACT IN AERS: 1.0000 | INHALATION_SPRAY | Freq: Four times a day (QID) | RESPIRATORY_TRACT | Status: DC | PRN

## 2013-06-29 MED ORDER — PREDNISONE 5 MG PO TABS: 7.5000 mg | ORAL_TABLET | Freq: Every day | ORAL | Status: DC

## 2013-06-29 MED ORDER — SOLIFENACIN SUCCINATE 10 MG PO TABS: 10.0000 mg | ORAL_TABLET | Freq: Every day | ORAL | Status: DC

## 2013-06-29 MED ORDER — IPRATROPIUM BROMIDE 0.06 % NA SOLN: 2.0000 | Freq: Four times a day (QID) | NASAL | Status: DC

## 2013-06-29 MED ORDER — POLYSACCHARIDE IRON COMPLEX 150 MG PO CAPS: ORAL_CAPSULE | ORAL | Status: DC

## 2013-06-29 MED ORDER — MIRABEGRON ER 50 MG PO TB24: 50.0000 mg | ORAL_TABLET | Freq: Every day | ORAL | Status: DC

## 2013-06-29 MED ORDER — ATORVASTATIN CALCIUM 20 MG PO TABS: 20.0000 mg | ORAL_TABLET | Freq: Every day | ORAL | Status: DC

## 2013-06-29 MED ORDER — RIVAROXABAN 20 MG PO TABS: 20.0000 mg | ORAL_TABLET | Freq: Every day | ORAL | Status: DC

## 2013-06-29 MED ORDER — CITALOPRAM HYDROBROMIDE 20 MG PO TABS: ORAL_TABLET | ORAL | Status: DC

## 2013-06-29 MED ORDER — DICLOFENAC SODIUM 1 % TD GEL: 1.0000 "application " | Freq: Two times a day (BID) | TRANSDERMAL | Status: DC | PRN

## 2013-06-29 MED ORDER — METHOTREXATE 2.5 MG PO TABS: ORAL_TABLET | ORAL | Status: DC

## 2013-06-29 MED ORDER — LISINOPRIL 10 MG PO TABS: 10.0000 mg | ORAL_TABLET | Freq: Two times a day (BID) | ORAL | Status: DC

## 2013-06-29 MED ORDER — PANTOPRAZOLE SODIUM 40 MG PO TBEC: 40.0000 mg | DELAYED_RELEASE_TABLET | Freq: Every day | ORAL | Status: DC

## 2013-06-29 MED ORDER — EZETIMIBE 10 MG PO TABS: 10.0000 mg | ORAL_TABLET | Freq: Every day | ORAL | Status: DC

## 2013-06-29 MED ORDER — ROPINIROLE HCL 2 MG PO TABS: 2.0000 mg | ORAL_TABLET | Freq: Every day | ORAL | Status: DC

## 2013-06-29 NOTE — Telephone Encounter (Signed)
c 

## 2013-07-03 ENCOUNTER — Ambulatory Visit: Payer: Medicare PPO

## 2013-07-03 ENCOUNTER — Ambulatory Visit: Payer: Medicare PPO | Admitting: Occupational Therapy

## 2013-07-05 ENCOUNTER — Ambulatory Visit: Payer: Medicare PPO

## 2013-07-05 ENCOUNTER — Ambulatory Visit: Payer: Medicare PPO | Admitting: Occupational Therapy

## 2013-07-06 ENCOUNTER — Encounter: Payer: Self-pay | Admitting: Internal Medicine

## 2013-07-06 ENCOUNTER — Encounter: Payer: Self-pay | Admitting: Physical Medicine & Rehabilitation

## 2013-07-06 ENCOUNTER — Ambulatory Visit (HOSPITAL_BASED_OUTPATIENT_CLINIC_OR_DEPARTMENT_OTHER): Payer: Medicare PPO | Admitting: Physical Medicine & Rehabilitation

## 2013-07-06 ENCOUNTER — Encounter: Payer: Medicare PPO | Attending: Physical Medicine & Rehabilitation

## 2013-07-06 ENCOUNTER — Ambulatory Visit (INDEPENDENT_AMBULATORY_CARE_PROVIDER_SITE_OTHER): Payer: Medicare PPO | Admitting: Internal Medicine

## 2013-07-06 VITALS — BP 98/49 | HR 88 | Resp 14 | Ht 75.0 in | Wt 227.0 lb

## 2013-07-06 VITALS — BP 120/64 | HR 88 | Temp 98.2°F | Wt 231.0 lb

## 2013-07-06 DIAGNOSIS — M1990 Unspecified osteoarthritis, unspecified site: Secondary | ICD-10-CM | POA: Insufficient documentation

## 2013-07-06 DIAGNOSIS — I6340 Cerebral infarction due to embolism of unspecified cerebral artery: Secondary | ICD-10-CM

## 2013-07-06 DIAGNOSIS — R031 Nonspecific low blood-pressure reading: Secondary | ICD-10-CM

## 2013-07-06 DIAGNOSIS — H539 Unspecified visual disturbance: Secondary | ICD-10-CM | POA: Insufficient documentation

## 2013-07-06 DIAGNOSIS — I4891 Unspecified atrial fibrillation: Secondary | ICD-10-CM

## 2013-07-06 DIAGNOSIS — I10 Essential (primary) hypertension: Secondary | ICD-10-CM

## 2013-07-06 DIAGNOSIS — I6350 Cerebral infarction due to unspecified occlusion or stenosis of unspecified cerebral artery: Secondary | ICD-10-CM

## 2013-07-06 DIAGNOSIS — I69998 Other sequelae following unspecified cerebrovascular disease: Secondary | ICD-10-CM | POA: Insufficient documentation

## 2013-07-06 DIAGNOSIS — R29898 Other symptoms and signs involving the musculoskeletal system: Secondary | ICD-10-CM | POA: Insufficient documentation

## 2013-07-06 DIAGNOSIS — H5333 Simultaneous visual perception without fusion: Secondary | ICD-10-CM | POA: Insufficient documentation

## 2013-07-06 DIAGNOSIS — I69919 Unspecified symptoms and signs involving cognitive functions following unspecified cerebrovascular disease: Secondary | ICD-10-CM | POA: Insufficient documentation

## 2013-07-06 DIAGNOSIS — I639 Cerebral infarction, unspecified: Secondary | ICD-10-CM

## 2013-07-06 DIAGNOSIS — M25519 Pain in unspecified shoulder: Secondary | ICD-10-CM | POA: Insufficient documentation

## 2013-07-06 DIAGNOSIS — I69993 Ataxia following unspecified cerebrovascular disease: Secondary | ICD-10-CM | POA: Insufficient documentation

## 2013-07-06 MED ORDER — AMLODIPINE BESYLATE 5 MG PO TABS: 5.0000 mg | ORAL_TABLET | Freq: Every day | ORAL | Status: DC

## 2013-07-06 NOTE — Patient Instructions (Signed)
Limit your sodium (Salt) intake  Please check your blood pressure on a regular basis.  If it is consistently greater than 150/90, please make an office appointment.    It is important that you exercise regularly, at least 20 minutes 3 to 4 times per week.  If you develop chest pain or shortness of breath seek  medical attention.  Return in one month for follow-up

## 2013-07-06 NOTE — Progress Notes (Signed)
Pre visit review using our clinic review tool, if applicable. No additional management support is needed unless otherwise documented below in the visit note. 

## 2013-07-06 NOTE — Patient Instructions (Signed)
No driving May be left alone 2 hours at a time for next week, no stove use If this goes well may go up to 3 hours at a time  Return to clinic in one month We'll refer to neuropsychologist at that time  Continue outpatient speech therapy and occupational therapy

## 2013-07-06 NOTE — Progress Notes (Signed)
Subjective:    Patient ID: Christopher Baker, male    DOB: Dec 15, 1933, 80 y.o.   MRN: [id number] Christopher Baker is a 80 y.o. right-handed male with history of hypertension,paroxysmal atrial fibrillation with pacemaker. Patient on no anti-coagulation prior to admission secondary to inconsistent appointments and refused with anticoagulation.. Admitted 04/03/2013 with left-sided weakness and facial droop. Cranial CT scan showed right cortical and subcortical infarcts(right caudate head, right parietal lobe). Infarct felt to be embolic secondary to history of PAF. MRI not completed secondary to pacemaker. Carotid Doppler showed bilateral ICA stenosis 40-59%. Echocardiogram with ejection fraction 55% grade 1 diastolic dysfunction no wall motion abnormalities. CT angiogram head negative for posterior circulation without stenosis or major occlusion. Patient did not receive TPA. Neurology service followup recommendations of Xarelto for CVA prophylaxis. Patient is maintained on a regular diet. Patient maintained on chronic prednisone therapy for history of psoriatic arthritis as well as methotrexate. Physical and occupational therapy evaluations completed an ongoing with recommendations for physical medicine rehabilitation consult to consider inpatient rehabilitation services. Patient was felt to be a good candidate for inpatient rehabilitation services and was admitted for comprehensive rehabilitation program First visit after discharge from [facility]  DATE OF ADMISSION: 04/06/2013  DATE OF DISCHARGE: 04/14/2013  HPI CC LUE coordination  Cognitive word and math problems, compensating Struggling with deposit tickets at bank Needs assist with medication management  Outpatient therapy 3 days a week PT OT speech therapy  Pain Inventory Average Pain 0 Pain Right Now 0 My pain is intermittent and aching  In the last 24 hours, has pain interfered with the following? General activity 0 Relation with others  0 Enjoyment of life 0 What TIME of day is your pain at its worst? morning Sleep (in general) Good  Pain is worse with: weather Pain improves with: exercising Relief from Meds: no pain meds  Mobility walk without assistance how many minutes can you walk? 15 ability to climb steps?  yes do you drive?  no transfers alone Do you have any goals in this area?  yes  Function employed # of hrs/week 3 Do you have any goals in this area?  yes  Neuro/Psych tremor dizziness confusion  Prior Studies Any changes since last visit?  no  Physicians involved in your care Any changes since last visit?  no   Family History  Problem Relation Age of Onset  . Stroke Mother   . Early death Father    History   Social History  . Marital Status: Married    Spouse Name: N/A    Number of Children: N/A  . Years of Education: N/A   Occupational History  . retired    Social History Main Topics  . Smoking status: Former Smoker -- 0.10 packs/day for 55 years    Types: Cigarettes  . Smokeless tobacco: Never Used     Comment: pt statest he smokes about "once a month"   . Alcohol Use: Yes     Comment: RARE  . Drug Use: No  . Sexual Activity: Yes   Other Topics Concern  . None   Social History Narrative  . None   Past Surgical History  Procedure Laterality Date  . Replacement total knee  1997    RIGHT  . Cardiac pacemaker placement  06-04-10    DDD/ AND REMOVAL LOOR RECORDER  . Loop recorder placement  01-30-10  . Lumbar laminectomy/ diskectomy/ fusion  05-19-10    L4 - 5  . Cholecystectomy  2009  . Cardiac electrophysiology study and ablation  2008    FOR SVT  . Prostate biopsy  2007  . Transurethral resection of prostate  2006  . Inguinal hernia repair  2005    LEFT/   DONE WITH PENILE PROSTESIS SURG.  . Removal and placement penile prosthesis implant   2005    AND LEFT CORPOROPLASTY /    DONE INGUINAL REPAIR  . Penile prosthesis implant  1995  . Knee arthroscopy   06/02/2011    Procedure: ARTHROSCOPY KNEE;  Surgeon: Gearlean Alf;  Location: [facility];  Service: Orthopedics;  Laterality: Left;  LEFT KNEE ARTHROSCOPY WITH DEBRIDEMENT  . Total knee arthroplasty  12/20/2011    Procedure: TOTAL KNEE ARTHROPLASTY;  Surgeon: Gearlean Alf, MD;  Location: [facility];  Service: Orthopedics;  Laterality: Left;  . Back surgery     Past Medical History  Diagnosis Date  . Hyperlipidemia   . HTN (hypertension)   . Status post placement of cardiac pacemaker DDD-  06-04-10-- LAST CHECK 09-08-10 IN EPIC    SECONDARY TO SYNCOPY AND BRADYCARDIA  . Restless leg syndrome   . Normal nuclear stress test 2008    LOW RISK--   DR  WALL  . Carotid stenosis, bilateral PER DR EARLY / DUPLEX  09-09-10      40 - 59%   BILATERALLY--- ASYMPTOMATIC  . History of echocardiogram 11-21-2006    EF 65%  . History of prostate cancer S/P RADIATION  TX  6 YRS AGO  . Psoriasis   . Arthritis with psoriasis   . GERD (gastroesophageal reflux disease)     CONTROLLED W/ PROTONIX  . Borderline diabetes     DIET CONTROLLED - PT STATES HE IS NOT DIABETIC  . Lumbar spondylosis W/ RADICULOPATHY  . Iron deficiency   . Cataract immature LEFT EYE  . PAF (paroxysmal atrial fibrillation)   . SVT (supraventricular tachycardia)     S/P ABLATION   2008  . Coronary artery disease CARDIOLOGIST- DR Crissie Sickles    09-08-10 VISIT AND PACEMAKER CHECK  IN EPIC  . Cancer     HX OF PROSTATE CANCER--TX'D WITH RADIATION  . Sleep apnea TESTED YRS AGO-- NO CPAP RX GIVEN    PT STATES HE DOES NOT THINK HE STILL HAS SLEEP APNEA--DOES NOT USE CPAP  . Stroke    BP 98/49  Pulse 88  Resp 14  Ht 6\' 3"  (1.905 m)  Wt 227 lb (102.967 kg)  BMI 28.37 kg/m2  SpO2 98%      Review of Systems  Neurological: Positive for dizziness and tremors.  Psychiatric/Behavioral: Positive for confusion.  All other systems reviewed and are negative.       Objective:   Physical Exam  Nursing note and  vitals reviewed.  Constitutional: He is oriented to person, place, and time. He appears well-nourished.  HENT:  Head: Normocephalic and atraumatic.  Eyes: Conjunctivae and EOM are normal. Pupils are equal, round, and reactive to light.  Neck: Normal range of motion.  Neurological: He is alert and oriented to person, place, and time. He displays no atrophy. No sensory deficit. He exhibits normal muscle tone. Coordination and gait abnormal.  Motor strength is 4/5 in the left deltoid, bicep, tricep, grip 5/5 in bilateral hip flexors knee extensors ankle dorsiflexors and plantar flexor 5/5 in the right deltoid, bicep and tricep and grip Ambulates with a straight cane but can also ambulate without cane indoors.  Decreased fine motor  coordination left hand  Patient is forgetful about his medications as well as recent therapy and physician visits  Psychiatric: He has a normal mood and affect. His speech is delayed. He is slowed. He exhibits abnormal recent memory.  Remember 1/3 unrelated object      Assessment & Plan:  1. Right MCA infarct with cognitive deficits, visual perceptual deficits, residual left upper extremity weakness as well as decreased balance. Recommend outpatient  OT and speech.  Recommend no driving   Will need neuropsychology evaluation once OT and speech  Completed  2. History of hypertension however blood pressures are  running low. Patient will contact primary care physician for further instructions. In the meantime have recommended reducing lisinopril to once a day

## 2013-07-06 NOTE — Progress Notes (Signed)
Subjective:    Patient ID: Christopher Baker, male    DOB: 12/23/1933, 80 y.o.   MRN: [id number]  HPI  80 year old patient who has a history of PAF and status post right MCA cardioembolic stroke in October. Blood pressure medications were intensified at that time. He was seen last month in the setting of a URI with documented hypotension. His wife continues to document blood pressures closely with systolic readings occasionally in the 90s. He has occasional episodes of near syncope after stooping and then standing upright. There have been no falls.  BP Readings from Last 3 Encounters:  07/06/13 120/64  07/06/13 98/49  06/22/13 136/71   Past Medical History  Diagnosis Date  . Hyperlipidemia   . HTN (hypertension)   . Status post placement of cardiac pacemaker DDD-  06-04-10-- LAST CHECK 09-08-10 IN EPIC    SECONDARY TO SYNCOPY AND BRADYCARDIA  . Restless leg syndrome   . Normal nuclear stress test 2008    LOW RISK--   DR  WALL  . Carotid stenosis, bilateral PER DR EARLY / DUPLEX  09-09-10      40 - 59%   BILATERALLY--- ASYMPTOMATIC  . History of echocardiogram 11-21-2006    EF 65%  . History of prostate cancer S/P RADIATION  TX  6 YRS AGO  . Psoriasis   . Arthritis with psoriasis   . GERD (gastroesophageal reflux disease)     CONTROLLED W/ PROTONIX  . Borderline diabetes     DIET CONTROLLED - PT STATES HE IS NOT DIABETIC  . Lumbar spondylosis W/ RADICULOPATHY  . Iron deficiency   . Cataract immature LEFT EYE  . PAF (paroxysmal atrial fibrillation)   . SVT (supraventricular tachycardia)     S/P ABLATION   2008  . Coronary artery disease CARDIOLOGIST- DR Crissie Sickles    09-08-10 VISIT AND PACEMAKER CHECK  IN EPIC  . Cancer     HX OF PROSTATE CANCER--TX'D WITH RADIATION  . Sleep apnea TESTED YRS AGO-- NO CPAP RX GIVEN    PT STATES HE DOES NOT THINK HE STILL HAS SLEEP APNEA--DOES NOT USE CPAP  . Stroke     History   Social History  . Marital Status: Married    Spouse Name:  N/A    Number of Children: N/A  . Years of Education: N/A   Occupational History  . retired    Social History Main Topics  . Smoking status: Former Smoker -- 0.10 packs/day for 55 years    Types: Cigarettes  . Smokeless tobacco: Never Used     Comment: pt statest he smokes about "once a month"   . Alcohol Use: Yes     Comment: RARE  . Drug Use: No  . Sexual Activity: Yes   Other Topics Concern  . Not on file   Social History Narrative  . No narrative on file    Past Surgical History  Procedure Laterality Date  . Replacement total knee  1997    RIGHT  . Cardiac pacemaker placement  06-04-10    DDD/ AND REMOVAL LOOR RECORDER  . Loop recorder placement  01-30-10  . Lumbar laminectomy/ diskectomy/ fusion  05-19-10    L4 - 5  . Cholecystectomy  2009  . Cardiac electrophysiology study and ablation  2008    FOR SVT  . Prostate biopsy  2007  . Transurethral resection of prostate  2006  . Inguinal hernia repair  2005    LEFT/   DONE WITH  PENILE PROSTESIS SURG.  . Removal and placement penile prosthesis implant   2005    AND LEFT CORPOROPLASTY /    DONE INGUINAL REPAIR  . Penile prosthesis implant  1995  . Knee arthroscopy  06/02/2011    Procedure: ARTHROSCOPY KNEE;  Surgeon: Gearlean Alf;  Location: [facility];  Service: Orthopedics;  Laterality: Left;  LEFT KNEE ARTHROSCOPY WITH DEBRIDEMENT  . Total knee arthroplasty  12/20/2011    Procedure: TOTAL KNEE ARTHROPLASTY;  Surgeon: Gearlean Alf, MD;  Location: [facility];  Service: Orthopedics;  Laterality: Left;  . Back surgery      Family History  Problem Relation Age of Onset  . Stroke Mother   . Early death Father     Allergies  Allergen Reactions  . Ciprofloxacin Nausea Only    REACTION: GI upset  . Other     SCOLLOPS--SUDDEN GI ATTACK    Current Outpatient Prescriptions on File Prior to Visit  Medication Sig Dispense Refill  . albuterol (PROVENTIL HFA;VENTOLIN HFA) 108 (90 BASE) MCG/ACT  inhaler Inhale 1-2 puffs into the lungs every 6 (six) hours as needed for wheezing or shortness of breath.  3 Inhaler  3  . amLODipine (NORVASC) 10 MG tablet Take 1 tablet (10 mg total) by mouth daily.  90 tablet  3  . atorvastatin (LIPITOR) 20 MG tablet Take 1 tablet (20 mg total) by mouth daily. TAKE ONE TABLET BY MOUTH DAILY  90 tablet  3  . citalopram (CELEXA) 20 MG tablet TAKE 1 TABLET (20 MG TOTAL) BY MOUTH DAILY.  90 tablet  3  . clotrimazole-betamethasone (LOTRISONE) cream Apply 1 application topically 2 (two) times daily. For psoriasis.apply to affected are twice a day  90 g  3  . diclofenac sodium (VOLTAREN) 1 % GEL Apply 1 application topically 2 (two) times daily as needed. Arthritis in both thumbs.  300 g  3  . ezetimibe (ZETIA) 10 MG tablet Take 1 tablet (10 mg total) by mouth daily.  90 tablet  3  . ipratropium (ATROVENT) 0.06 % nasal spray Place 2 sprays into both nostrils 4 (four) times daily.  45 mL  3  . iron polysaccharides (NIFEREX) 150 MG capsule Take 150 mg by mouth 2 (two) times daily.      . iron polysaccharides (POLY-IRON 150) 150 MG capsule TAKE ONE CAPSULE BY MOUTH TWICE DAILY  180 capsule  3  . lisinopril (PRINIVIL,ZESTRIL) 10 MG tablet Take 1 tablet (10 mg total) by mouth 2 (two) times daily.  180 tablet  3  . methotrexate (RHEUMATREX) 2.5 MG tablet TAKE FOUR TABLETS BY MOUTH ONCE A WEEK.  16 tablet  9  . mirabegron ER (MYRBETRIQ) 50 MG TB24 tablet Take 1 tablet (50 mg total) by mouth daily.  90 tablet  3  . pantoprazole (PROTONIX) 40 MG tablet Take 1 tablet (40 mg total) by mouth daily.  90 tablet  3  . predniSONE (DELTASONE) 5 MG tablet Take 1.5 tablets (7.5 mg total) by mouth daily.  135 tablet  3  . Rivaroxaban (XARELTO) 20 MG TABS tablet Take 1 tablet (20 mg total) by mouth daily with supper. This is the new medication for stroke prevention  90 tablet  3  . rOPINIRole (REQUIP) 2 MG tablet Take 1 tablet (2 mg total) by mouth at bedtime.  90 tablet  3  . solifenacin  (VESICARE) 10 MG tablet Take 1 tablet (10 mg total) by mouth daily. Use vesicare or ditropan--not both  90 tablet  3  . triamcinolone cream (KENALOG) 0.1 % APPLY TO AFFECTED AREAS AS DIRECTED  480 g  3   No current facility-administered medications on file prior to visit.    BP 120/64  Pulse 88  Temp(Src) 98.2 F (36.8 C) (Oral)  Wt 231 lb (104.781 kg)  SpO2 95%       Review of Systems  Constitutional: Negative for fever, chills, appetite change and fatigue.  HENT: Negative for congestion, dental problem, ear pain, hearing loss, sore throat, tinnitus, trouble swallowing and voice change.   Eyes: Negative for pain, discharge and visual disturbance.  Respiratory: Negative for cough, chest tightness, wheezing and stridor.   Cardiovascular: Negative for chest pain, palpitations and leg swelling.  Gastrointestinal: Negative for nausea, vomiting, abdominal pain, diarrhea, constipation, blood in stool and abdominal distention.  Genitourinary: Negative for urgency, hematuria, flank pain, discharge, difficulty urinating and genital sores.  Musculoskeletal: Negative for arthralgias, back pain, gait problem, joint swelling, myalgias and neck stiffness.  Skin: Negative for rash.  Neurological: Positive for light-headedness. Negative for dizziness, syncope, speech difficulty, weakness, numbness and headaches.  Hematological: Negative for adenopathy. Does not bruise/bleed easily.  Psychiatric/Behavioral: Negative for behavioral problems and dysphoric mood. The patient is not nervous/anxious.        Objective:   Physical Exam  Constitutional: He is oriented to person, place, and time. He appears well-developed.  Blood pressure as low as 96/60 No orthostatic drop sitting to standing  HENT:  Head: Normocephalic.  Right Ear: External ear normal.  Left Ear: External ear normal.  Eyes: Conjunctivae and EOM are normal.  Neck: Normal range of motion.  Cardiovascular: Normal rate, regular  rhythm and normal heart sounds.   Pulmonary/Chest: Breath sounds normal.  Abdominal: Bowel sounds are normal.  Musculoskeletal: Normal range of motion. He exhibits no edema and no tenderness.  Neurological: He is alert and oriented to person, place, and time.  Psychiatric: He has a normal mood and affect. His behavior is normal.          Assessment & Plan:     Episodic   Episodic symptomatic hypotension. Will decrease amlodipine to 5 mg daily. Close home blood pressure monitoring encouraged Recheck one month  Paroxysmal atrial fibrillation Status post cardioembolic right MCA stroke. Continue anticoagulation

## 2013-07-10 ENCOUNTER — Ambulatory Visit: Payer: Medicare PPO | Admitting: Occupational Therapy

## 2013-07-10 ENCOUNTER — Ambulatory Visit: Payer: Medicare PPO

## 2013-07-12 ENCOUNTER — Ambulatory Visit: Payer: Medicare PPO | Admitting: Speech Pathology

## 2013-07-12 ENCOUNTER — Ambulatory Visit: Payer: Medicare PPO | Admitting: Occupational Therapy

## 2013-07-17 ENCOUNTER — Ambulatory Visit: Payer: Medicare PPO

## 2013-07-17 ENCOUNTER — Ambulatory Visit: Payer: Medicare PPO | Admitting: Occupational Therapy

## 2013-07-19 ENCOUNTER — Ambulatory Visit: Payer: Medicare PPO | Admitting: Occupational Therapy

## 2013-07-19 ENCOUNTER — Ambulatory Visit: Payer: Medicare PPO

## 2013-07-31 ENCOUNTER — Encounter: Payer: Self-pay | Admitting: Physical Medicine & Rehabilitation

## 2013-07-31 ENCOUNTER — Encounter: Payer: Medicare PPO | Attending: Physical Medicine & Rehabilitation

## 2013-07-31 ENCOUNTER — Ambulatory Visit (HOSPITAL_BASED_OUTPATIENT_CLINIC_OR_DEPARTMENT_OTHER): Payer: Medicare PPO | Admitting: Physical Medicine & Rehabilitation

## 2013-07-31 VITALS — BP 131/56 | HR 84 | Resp 14 | Ht 75.0 in | Wt 229.0 lb

## 2013-07-31 DIAGNOSIS — I69919 Unspecified symptoms and signs involving cognitive functions following unspecified cerebrovascular disease: Secondary | ICD-10-CM

## 2013-07-31 DIAGNOSIS — G8334 Monoplegia, unspecified affecting left nondominant side: Secondary | ICD-10-CM

## 2013-07-31 DIAGNOSIS — I69319 Unspecified symptoms and signs involving cognitive functions following cerebral infarction: Secondary | ICD-10-CM

## 2013-07-31 DIAGNOSIS — R29898 Other symptoms and signs involving the musculoskeletal system: Secondary | ICD-10-CM | POA: Insufficient documentation

## 2013-07-31 DIAGNOSIS — I69998 Other sequelae following unspecified cerebrovascular disease: Secondary | ICD-10-CM | POA: Insufficient documentation

## 2013-07-31 DIAGNOSIS — G8330 Monoplegia, unspecified affecting unspecified side: Secondary | ICD-10-CM

## 2013-07-31 DIAGNOSIS — H539 Unspecified visual disturbance: Secondary | ICD-10-CM | POA: Insufficient documentation

## 2013-07-31 NOTE — Progress Notes (Signed)
Subjective:    Patient ID: Christopher Baker, male    DOB: Feb 09, 1934, 80 y.o.   MRN: [id number]  HPI Done with PT and Speech Discussed Return to golf Dr Burnice Logan reduced amlodipine Asking about fatigue Discussed cognitive driving Seeing trainer  Pain Inventory Average Pain 2 Pain Right Now 2 My pain is intermittent and dull  In the last 24 hours, has pain interfered with the following? General activity 2 Relation with others 1 Enjoyment of life 2 What TIME of day is your pain at its worst? morning Sleep (in general) Fair  Pain is worse with: bending and inactivity Pain improves with: heat/ice and medication Relief from Meds: 6  Mobility walk without assistance how many minutes can you walk? 60 ability to climb steps?  yes do you drive?  no transfers alone  Function employed # of hrs/week 16 retired I need assistance with the following:  meal prep  Neuro/Psych dizziness  Prior Studies Any changes since last visit?  no  Physicians involved in your care Any changes since last visit?  no   Family History  Problem Relation Age of Onset  . Stroke Mother   . Early death Father    History   Social History  . Marital Status: Married    Spouse Name: N/A    Number of Children: N/A  . Years of Education: N/A   Occupational History  . retired    Social History Main Topics  . Smoking status: Former Smoker -- 0.10 packs/day for 55 years    Types: Cigarettes  . Smokeless tobacco: Never Used     Comment: pt statest he smokes about "once a month"   . Alcohol Use: Yes     Comment: RARE  . Drug Use: No  . Sexual Activity: Yes   Other Topics Concern  . None   Social History Narrative  . None   Past Surgical History  Procedure Laterality Date  . Replacement total knee  1997    RIGHT  . Cardiac pacemaker placement  06-04-10    DDD/ AND REMOVAL LOOR RECORDER  . Loop recorder placement  01-30-10  . Lumbar laminectomy/ diskectomy/ fusion  05-19-10   L4 - 5  . Cholecystectomy  2009  . Cardiac electrophysiology study and ablation  2008    FOR SVT  . Prostate biopsy  2007  . Transurethral resection of prostate  2006  . Inguinal hernia repair  2005    LEFT/   DONE WITH PENILE PROSTESIS SURG.  . Removal and placement penile prosthesis implant   2005    AND LEFT CORPOROPLASTY /    DONE INGUINAL REPAIR  . Penile prosthesis implant  1995  . Knee arthroscopy  06/02/2011    Procedure: ARTHROSCOPY KNEE;  Surgeon: Gearlean Alf;  Location: [facility];  Service: Orthopedics;  Laterality: Left;  LEFT KNEE ARTHROSCOPY WITH DEBRIDEMENT  . Total knee arthroplasty  12/20/2011    Procedure: TOTAL KNEE ARTHROPLASTY;  Surgeon: Gearlean Alf, MD;  Location: [facility];  Service: Orthopedics;  Laterality: Left;  . Back surgery     Past Medical History  Diagnosis Date  . Hyperlipidemia   . HTN (hypertension)   . Status post placement of cardiac pacemaker DDD-  06-04-10-- LAST CHECK 09-08-10 IN EPIC    SECONDARY TO SYNCOPY AND BRADYCARDIA  . Restless leg syndrome   . Normal nuclear stress test 2008    LOW RISK--   DR  WALL  . Carotid  stenosis, bilateral PER DR EARLY / DUPLEX  09-09-10      40 - 59%   BILATERALLY--- ASYMPTOMATIC  . History of echocardiogram 11-21-2006    EF 65%  . History of prostate cancer S/P RADIATION  TX  6 YRS AGO  . Psoriasis   . Arthritis with psoriasis   . GERD (gastroesophageal reflux disease)     CONTROLLED W/ PROTONIX  . Borderline diabetes     DIET CONTROLLED - PT STATES HE IS NOT DIABETIC  . Lumbar spondylosis W/ RADICULOPATHY  . Iron deficiency   . Cataract immature LEFT EYE  . PAF (paroxysmal atrial fibrillation)   . SVT (supraventricular tachycardia)     S/P ABLATION   2008  . Coronary artery disease CARDIOLOGIST- DR Crissie Sickles    09-08-10 VISIT AND PACEMAKER CHECK  IN EPIC  . Cancer     HX OF PROSTATE CANCER--TX'D WITH RADIATION  . Sleep apnea TESTED YRS AGO-- NO CPAP RX GIVEN    PT STATES  HE DOES NOT THINK HE STILL HAS SLEEP APNEA--DOES NOT USE CPAP  . Stroke    BP 131/56  Pulse 84  Resp 14  Ht 6\' 3"  (1.905 m)  Wt 229 lb (103.874 kg)  BMI 28.62 kg/m2  SpO2 97%  Opioid Risk Score:   Fall Risk Score: Moderate Fall Risk (6-13 points) (pt educated on fall risk, brochure given to pt.)    Review of Systems  Gastrointestinal: Positive for constipation.  Neurological: Positive for dizziness.  All other systems reviewed and are negative.       Objective:   Physical Exam  Neurological: He is alert and oriented to person, place, and time. He displays no atrophy. No sensory deficit. He exhibits normal muscle tone. Coordination and gait abnormal.  Motor strength is 4/5 in the left deltoid, bicep, tricep, grip 5/5 in bilateral hip flexors knee extensors ankle dorsiflexors and plantar flexor 5/5 in the right deltoid, bicep and tricep and grip Ambulates with a straight cane but can also ambulate without cane indoors.  Decreased fine motor coordination left hand  Patient is forgetful about his medications as well as recent therapy and physician visits  Psychiatric: He has a normal mood and affect. His speech is delayed. He is slowed. He exhibits abnormal recent memory.  Remember 1/3 unrelated object       Assessment & Plan:  Right MCA infarct with cognitive deficits, visual perceptual deficits, residual left upper extremity weakness as well as decreased balance. Recommend outpatient OT and speech.  Recommend no driving  Neuropsych eval Dr Valentina Shaggy to eval cognition RTC 6wks  Over half of the 25 min visit was spent counseling and coordinating care. Answered wife's list of questions

## 2013-08-06 ENCOUNTER — Other Ambulatory Visit: Payer: Self-pay | Admitting: Internal Medicine

## 2013-08-06 ENCOUNTER — Ambulatory Visit: Payer: Medicare PPO | Admitting: Physical Medicine & Rehabilitation

## 2013-08-14 ENCOUNTER — Other Ambulatory Visit: Payer: Self-pay | Admitting: Internal Medicine

## 2013-08-18 ENCOUNTER — Encounter: Payer: Self-pay | Admitting: Internal Medicine

## 2013-08-22 ENCOUNTER — Telehealth: Payer: Self-pay | Admitting: Internal Medicine

## 2013-08-22 DIAGNOSIS — K219 Gastro-esophageal reflux disease without esophagitis: Secondary | ICD-10-CM

## 2013-08-22 MED ORDER — LISINOPRIL 10 MG PO TABS: 10.0000 mg | ORAL_TABLET | Freq: Two times a day (BID) | ORAL | Status: DC

## 2013-08-22 MED ORDER — IPRATROPIUM BROMIDE 0.06 % NA SOLN: 2.0000 | Freq: Four times a day (QID) | NASAL | Status: DC

## 2013-08-22 MED ORDER — PANTOPRAZOLE SODIUM 40 MG PO TBEC: 40.0000 mg | DELAYED_RELEASE_TABLET | Freq: Every day | ORAL | Status: DC

## 2013-08-22 MED ORDER — ROPINIROLE HCL 2 MG PO TABS: 2.0000 mg | ORAL_TABLET | Freq: Every day | ORAL | Status: DC

## 2013-08-22 MED ORDER — AMLODIPINE BESYLATE 5 MG PO TABS: 5.0000 mg | ORAL_TABLET | Freq: Every day | ORAL | Status: DC

## 2013-08-22 MED ORDER — EZETIMIBE 10 MG PO TABS: 10.0000 mg | ORAL_TABLET | Freq: Every day | ORAL | Status: DC

## 2013-08-22 MED ORDER — CITALOPRAM HYDROBROMIDE 20 MG PO TABS: ORAL_TABLET | ORAL | Status: DC

## 2013-08-22 MED ORDER — METHOTREXATE 2.5 MG PO TABS: ORAL_TABLET | ORAL | Status: DC

## 2013-08-22 MED ORDER — PREDNISONE 5 MG PO TABS: 7.5000 mg | ORAL_TABLET | Freq: Every day | ORAL | Status: DC

## 2013-08-22 MED ORDER — ATORVASTATIN CALCIUM 20 MG PO TABS: 20.0000 mg | ORAL_TABLET | Freq: Every day | ORAL | Status: DC

## 2013-08-22 NOTE — Telephone Encounter (Signed)
[facility] - [place], [address] requesting refills of the following:  predniSONE (DELTASONE) 5 MG tablet rOPINIRole (REQUIP) 2 MG tablet ezetimibe (ZETIA) 10 MG tablet ipratropium (ATROVENT) 0.06 % nasal spray methotrexate (RHEUMATREX) 2.5 MG tablet pantoprazole (PROTONIX) 40 MG tablet

## 2013-08-22 NOTE — Telephone Encounter (Signed)
rx sent in electronically 

## 2013-08-22 NOTE — Telephone Encounter (Signed)
[facility] - [place], [place] - [address] requesting refills of the following:   predniSONE (DELTASONE) 5 MG tablet  rOPINIRole (REQUIP) 2 MG tablet  ezetimibe (ZETIA) 10 MG tablet  ipratropium (ATROVENT) 0.06 % nasal spray  methotrexate (RHEUMATREX) 2.5 MG tablet  pantoprazole (PROTONIX) 40 MG tablet lisinopril (PRINIVIL,ZESTRIL) 10 MG tablet amLODipine (NORVASC) 5 MG tablet atorvastatin (LIPITOR) 20 MG tablet citalopram (CELEXA) 20 MG tablet

## 2013-09-03 ENCOUNTER — Telehealth: Payer: Self-pay | Admitting: Internal Medicine

## 2013-09-03 NOTE — Telephone Encounter (Signed)
Christopher Baker from [facility], pt is needing syringes and informed [facility] that they requested the rx on my chart. Pharmacy has not received anything yet and is following up. Pharmacy was not sure the name of the rx because the pt did not provide him with that information.

## 2013-09-04 MED ORDER — "SYRINGE/NEEDLE (DISP) 25G X 5/8"" 3 ML MISC": 1.0000 | Status: DC

## 2013-09-04 NOTE — Telephone Encounter (Signed)
Spoke to pt's wife she said pt needs syringe and needles for Vit B 12 injections. Told her okay will send to pharmacy. Rx sent

## 2013-09-10 ENCOUNTER — Ambulatory Visit: Payer: Medicare PPO | Admitting: Physical Medicine & Rehabilitation

## 2013-09-13 DIAGNOSIS — I69919 Unspecified symptoms and signs involving cognitive functions following unspecified cerebrovascular disease: Secondary | ICD-10-CM

## 2013-09-19 ENCOUNTER — Other Ambulatory Visit: Payer: Self-pay | Admitting: Internal Medicine

## 2013-09-19 DIAGNOSIS — I69919 Unspecified symptoms and signs involving cognitive functions following unspecified cerebrovascular disease: Secondary | ICD-10-CM

## 2013-09-19 DIAGNOSIS — R51 Headache: Secondary | ICD-10-CM

## 2013-09-19 DIAGNOSIS — H539 Unspecified visual disturbance: Secondary | ICD-10-CM

## 2013-09-19 DIAGNOSIS — R55 Syncope and collapse: Secondary | ICD-10-CM

## 2013-09-19 DIAGNOSIS — R42 Dizziness and giddiness: Secondary | ICD-10-CM

## 2013-09-20 ENCOUNTER — Encounter: Payer: Self-pay | Admitting: Internal Medicine

## 2013-09-20 ENCOUNTER — Ambulatory Visit (INDEPENDENT_AMBULATORY_CARE_PROVIDER_SITE_OTHER): Payer: Medicare PPO | Admitting: Internal Medicine

## 2013-09-20 VITALS — BP 125/78 | HR 88 | Ht 75.0 in | Wt 226.0 lb

## 2013-09-20 DIAGNOSIS — I4891 Unspecified atrial fibrillation: Secondary | ICD-10-CM

## 2013-09-20 DIAGNOSIS — Z950 Presence of cardiac pacemaker: Secondary | ICD-10-CM

## 2013-09-20 DIAGNOSIS — R5381 Other malaise: Secondary | ICD-10-CM

## 2013-09-20 DIAGNOSIS — R5383 Other fatigue: Secondary | ICD-10-CM | POA: Insufficient documentation

## 2013-09-20 DIAGNOSIS — I10 Essential (primary) hypertension: Secondary | ICD-10-CM

## 2013-09-20 LAB — MDC_IDC_ENUM_SESS_TYPE_INCLINIC
Battery Impedance: 157 Ohm
Battery Remaining Longevity: 137 mo
Battery Voltage: 2.79 V
Brady Statistic AS VS Percent: 63 %
Date Time Interrogation Session: 20150402145738
Lead Channel Impedance Value: 432 Ohm
Lead Channel Pacing Threshold Amplitude: 1.5 V
Lead Channel Pacing Threshold Pulse Width: 0.4 ms
Lead Channel Pacing Threshold Pulse Width: 0.4 ms
Lead Channel Sensing Intrinsic Amplitude: 4 mV
Lead Channel Setting Pacing Amplitude: 4 V
Lead Channel Setting Pacing Pulse Width: 0.4 ms
Lead Channel Setting Sensing Sensitivity: 2.8 mV
MDC IDC MSMT LEADCHNL RA PACING THRESHOLD AMPLITUDE: 0.5 V
MDC IDC MSMT LEADCHNL RV IMPEDANCE VALUE: 391 Ohm
MDC IDC MSMT LEADCHNL RV SENSING INTR AMPL: 8 mV
MDC IDC SET LEADCHNL RA PACING AMPLITUDE: 2 V
MDC IDC STAT BRADY AP VP PERCENT: 1 %
MDC IDC STAT BRADY AP VS PERCENT: 35 %
MDC IDC STAT BRADY AS VP PERCENT: 0 %

## 2013-09-20 NOTE — Assessment & Plan Note (Signed)
The etiology of his fatigue and weakness is unclear. Most likely this is related to his multiple comorbidites but could be related to his statin therapy. I have asked the patient to stop his statin drug for a month. If he has no improvement then he has been instructed to restart his atorvastatin and if there is no improvement then he will restart his statin.

## 2013-09-20 NOTE — Assessment & Plan Note (Signed)
His blood pressure is well controlled and he will continue his current meds.

## 2013-09-20 NOTE — Assessment & Plan Note (Signed)
His [employer]tr[employer] DDD PM is working normally. Will recheck in several months. 

## 2013-09-20 NOTE — Progress Notes (Signed)
HPI [name] returns today for followup. He is a very pleasant 80 year old man with a history of unexplained syncope, symptomatic bradycardia, status post permanent pacemaker insertion, and hypertension. He has had a stroke as he previously had refused anti-coagulation despite his documented PAF.  He c/o weakness, and wonders if his statin drugs could be the cause.  He notes that he cannot drive the ball well and also has fatigue. No syncope. Allergies  Allergen Reactions  . Ciprofloxacin Nausea Only    REACTION: GI upset  . Other     SCOLLOPS--SUDDEN GI ATTACK     Current Outpatient Prescriptions  Medication Sig Dispense Refill  . amLODipine (NORVASC) 5 MG tablet Take 1 tablet (5 mg total) by mouth daily.  90 tablet  3  . atorvastatin (LIPITOR) 20 MG tablet Take 1 tablet (20 mg total) by mouth daily. TAKE ONE TABLET BY MOUTH DAILY  90 tablet  3  . citalopram (CELEXA) 20 MG tablet TAKE 1 TABLET (20 MG TOTAL) BY MOUTH DAILY.  90 tablet  3  . clotrimazole-betamethasone (LOTRISONE) cream Apply 1 application topically 2 (two) times daily. For psoriasis.apply to affected are twice a day  90 g  3  . Cyanocobalamin (VITAMIN B-12 IJ) Inject as directed. Every two weeks      . diclofenac sodium (VOLTAREN) 1 % GEL Apply 1 application topically 2 (two) times daily as needed. Arthritis in both thumbs.  300 g  3  . ezetimibe (ZETIA) 10 MG tablet Take 1 tablet (10 mg total) by mouth daily.  90 tablet  3  . ipratropium (ATROVENT) 0.06 % nasal spray Place 2 sprays into both nostrils 4 (four) times daily.  45 mL  3  . iron polysaccharides (NIFEREX) 150 MG capsule Take 150 mg by mouth 2 (two) times daily.      Marland Kitchen lisinopril (PRINIVIL,ZESTRIL) 10 MG tablet Take 1 tablet (10 mg total) by mouth 2 (two) times daily.  180 tablet  3  . methotrexate (RHEUMATREX) 2.5 MG tablet TAKE FOUR TABLETS BY MOUTH ONCE A WEEK.  16 tablet  3  . mirabegron ER (MYRBETRIQ) 50 MG TB24 tablet Take 1 tablet (50 mg total) by mouth  daily.  90 tablet  3  . pantoprazole (PROTONIX) 40 MG tablet Take 1 tablet (40 mg total) by mouth daily.  90 tablet  3  . predniSONE (DELTASONE) 5 MG tablet Take 1.5 tablets (7.5 mg total) by mouth daily.  135 tablet  3  . Rivaroxaban (XARELTO) 20 MG TABS tablet Take 1 tablet (20 mg total) by mouth daily with supper. This is the new medication for stroke prevention  90 tablet  3  . rOPINIRole (REQUIP) 2 MG tablet Take 1 tablet (2 mg total) by mouth at bedtime.  90 tablet  3  . solifenacin (VESICARE) 10 MG tablet Take 1 tablet (10 mg total) by mouth daily. Use vesicare or ditropan--not both  90 tablet  3  . SYRINGE-NEEDLE, DISP, 3 ML (BD ECLIPSE SYRINGE) 25G X 5/8" 3 ML MISC 1 each by Does not apply route every 14 (fourteen) days.  50 each  1   No current facility-administered medications for this visit.     Past Medical History  Diagnosis Date  . Hyperlipidemia   . HTN (hypertension)   . Status post placement of cardiac pacemaker DDD-  06-04-10-- LAST CHECK 09-08-10 IN EPIC    SECONDARY TO SYNCOPY AND BRADYCARDIA  . Restless leg syndrome   . Normal nuclear stress test 2008  LOW RISK--   DR  WALL  . Carotid stenosis, bilateral PER DR EARLY / DUPLEX  09-09-10      40 - 59%   BILATERALLY--- ASYMPTOMATIC  . History of echocardiogram 11-21-2006    EF 65%  . History of prostate cancer S/P RADIATION  TX  6 YRS AGO  . Psoriasis   . Arthritis with psoriasis   . GERD (gastroesophageal reflux disease)     CONTROLLED W/ PROTONIX  . Borderline diabetes     DIET CONTROLLED - PT STATES HE IS NOT DIABETIC  . Lumbar spondylosis W/ RADICULOPATHY  . Iron deficiency   . Cataract immature LEFT EYE  . PAF (paroxysmal atrial fibrillation)   . SVT (supraventricular tachycardia)     S/P ABLATION   2008  . Coronary artery disease CARDIOLOGIST- DR Crissie Sickles    09-08-10 VISIT AND PACEMAKER CHECK  IN EPIC  . Cancer     HX OF PROSTATE CANCER--TX'D WITH RADIATION  . Sleep apnea TESTED YRS AGO-- NO  CPAP RX GIVEN    PT STATES HE DOES NOT THINK HE STILL HAS SLEEP APNEA--DOES NOT USE CPAP  . Stroke     ROS:   All systems reviewed and negative except as noted in the HPI.   Past Surgical History  Procedure Laterality Date  . Replacement total knee  1997    RIGHT  . Cardiac pacemaker placement  06-04-10    DDD/ AND REMOVAL LOOR RECORDER  . Loop recorder placement  01-30-10  . Lumbar laminectomy/ diskectomy/ fusion  05-19-10    L4 - 5  . Cholecystectomy  2009  . Cardiac electrophysiology study and ablation  2008    FOR SVT  . Prostate biopsy  2007  . Transurethral resection of prostate  2006  . Inguinal hernia repair  2005    LEFT/   DONE WITH PENILE PROSTESIS SURG.  . Removal and placement penile prosthesis implant   2005    AND LEFT CORPOROPLASTY /    DONE INGUINAL REPAIR  . Penile prosthesis implant  1995  . Knee arthroscopy  06/02/2011    Procedure: ARTHROSCOPY KNEE;  Surgeon: Gearlean Alf;  Location: [facility];  Service: Orthopedics;  Laterality: Left;  LEFT KNEE ARTHROSCOPY WITH DEBRIDEMENT  . Total knee arthroplasty  12/20/2011    Procedure: TOTAL KNEE ARTHROPLASTY;  Surgeon: Gearlean Alf, MD;  Location: [facility];  Service: Orthopedics;  Laterality: Left;  . Back surgery       Family History  Problem Relation Age of Onset  . Stroke Mother   . Early death Father      History   Social History  . Marital Status: Married    Spouse Name: N/A    Number of Children: N/A  . Years of Education: N/A   Occupational History  . retired    Social History Main Topics  . Smoking status: Former Smoker -- 0.10 packs/day for 55 years    Types: Cigarettes  . Smokeless tobacco: Never Used  . Alcohol Use: Yes     Comment: RARE  . Drug Use: No  . Sexual Activity: Yes   Other Topics Concern  . Not on file   Social History Narrative  . No narrative on file     BP 125/78  Pulse 88  Ht 6\' 3"  (1.905 m)  Wt 226 lb (102.513 kg)  BMI 28.25  kg/m2  Physical Exam:  Well appearing 80 yo man, NAD HEENT: Unremarkable Neck:  7 cm JVD, no thyromegally Back:  No CVA tenderness Lungs:  Clear with no wheezes, well healed PPM incision. HEART:  Regular rate rhythm, no murmurs, no rubs, no clicks Abd:  soft, positive bowel sounds, no organomegally, no rebound, no guarding Ext:  2 plus pulses, no edema, no cyanosis, no clubbing Skin:  No rashes no nodules Neuro:  CN II through XII intact, motor grossly intact  DEVICE  Normal device function.  See PaceArt for details.   Assess/Plan:

## 2013-09-20 NOTE — Patient Instructions (Signed)
Your physician wants you to follow-up in: 12 months with Dr Knox Saliva will receive a reminder letter in the mail two months in advance. If you don't receive a letter, please call our office to schedule the follow-up appointment.    When you receive your box send transmission and then send another transmission in one month

## 2013-09-20 NOTE — Assessment & Plan Note (Signed)
He has maintained NSR approximately 96% of the time. He will continue his current meds including Xarelto.

## 2013-09-24 ENCOUNTER — Encounter: Payer: Medicare PPO | Attending: Physical Medicine & Rehabilitation

## 2013-09-24 ENCOUNTER — Ambulatory Visit (HOSPITAL_BASED_OUTPATIENT_CLINIC_OR_DEPARTMENT_OTHER): Payer: Medicare PPO | Admitting: Physical Medicine & Rehabilitation

## 2013-09-24 ENCOUNTER — Encounter: Payer: Self-pay | Admitting: Physical Medicine & Rehabilitation

## 2013-09-24 VITALS — BP 121/63 | HR 88 | Resp 14 | Ht 75.0 in | Wt 228.0 lb

## 2013-09-24 DIAGNOSIS — I69919 Unspecified symptoms and signs involving cognitive functions following unspecified cerebrovascular disease: Secondary | ICD-10-CM | POA: Diagnosis not present

## 2013-09-24 DIAGNOSIS — I69998 Other sequelae following unspecified cerebrovascular disease: Secondary | ICD-10-CM | POA: Insufficient documentation

## 2013-09-24 DIAGNOSIS — I69993 Ataxia following unspecified cerebrovascular disease: Secondary | ICD-10-CM | POA: Insufficient documentation

## 2013-09-24 DIAGNOSIS — H539 Unspecified visual disturbance: Secondary | ICD-10-CM | POA: Insufficient documentation

## 2013-09-24 DIAGNOSIS — M25519 Pain in unspecified shoulder: Secondary | ICD-10-CM | POA: Insufficient documentation

## 2013-09-24 DIAGNOSIS — I6350 Cerebral infarction due to unspecified occlusion or stenosis of unspecified cerebral artery: Secondary | ICD-10-CM | POA: Diagnosis not present

## 2013-09-24 DIAGNOSIS — H5333 Simultaneous visual perception without fusion: Secondary | ICD-10-CM | POA: Insufficient documentation

## 2013-09-24 DIAGNOSIS — M1990 Unspecified osteoarthritis, unspecified site: Secondary | ICD-10-CM | POA: Insufficient documentation

## 2013-09-24 DIAGNOSIS — I639 Cerebral infarction, unspecified: Secondary | ICD-10-CM

## 2013-09-24 DIAGNOSIS — R29898 Other symptoms and signs involving the musculoskeletal system: Secondary | ICD-10-CM | POA: Insufficient documentation

## 2013-09-24 NOTE — Patient Instructions (Signed)
We discussed results of cognitive testing  Any further questions may be directed to the neuropsychologist

## 2013-09-24 NOTE — Progress Notes (Signed)
Subjective:    Patient ID: NHIA HEAPHY, male    DOB: [birth year], 80 y.o.   MRN: [id number]  HPI F/U going to gym with trainer 56min twice a day, noting some balance problems, fell 2-3 days ago while vacuuming no injury. Seen by cardiology.   Discussed neuropsych evaluation with Dr. Valentina Shaggy Pain Inventory Average Pain 0 Pain Right Now 0 My pain is no pain  In the last 24 hours, has pain interfered with the following? General activity 0 Relation with others 0 Enjoyment of life 0 What TIME of day is your pain at its worst? evening Sleep (in general) Good  Pain is worse with: some activites Pain improves with: medication Relief from Meds: 7  Mobility walk without assistance ability to climb steps?  yes do you drive?  no transfers alone  Function employed # of hrs/week na what is your job? na  Neuro/Psych bladder control problems tremor dizziness  Prior Studies Any changes since last visit?  no  Physicians involved in your care Any changes since last visit?  no   Family History  Problem Relation Age of Onset  . Stroke Mother   . Early death Father    History   Social History  . Marital Status: Married    Spouse Name: N/A    Number of Children: N/A  . Years of Education: N/A   Occupational History  . retired    Social History Main Topics  . Smoking status: Former Smoker -- 0.10 packs/day for 55 years    Types: Cigarettes  . Smokeless tobacco: Never Used  . Alcohol Use: Yes     Comment: RARE  . Drug Use: No  . Sexual Activity: Yes   Other Topics Concern  . None   Social History Narrative  . None   Past Surgical History  Procedure Laterality Date  . Replacement total knee  1997    RIGHT  . Cardiac pacemaker placement  06-04-10    DDD/ AND REMOVAL LOOR RECORDER  . Loop recorder placement  01-30-10  . Lumbar laminectomy/ diskectomy/ fusion  05-19-10    L4 - 5  . Cholecystectomy  2009  . Cardiac electrophysiology study and ablation  2008     FOR SVT  . Prostate biopsy  2007  . Transurethral resection of prostate  2006  . Inguinal hernia repair  2005    LEFT/   DONE WITH PENILE PROSTESIS SURG.  . Removal and placement penile prosthesis implant   2005    AND LEFT CORPOROPLASTY /    DONE INGUINAL REPAIR  . Penile prosthesis implant  1995  . Knee arthroscopy  06/02/2011    Procedure: ARTHROSCOPY KNEE;  Surgeon: Gearlean Alf;  Location: [facility];  Service: Orthopedics;  Laterality: Left;  LEFT KNEE ARTHROSCOPY WITH DEBRIDEMENT  . Total knee arthroplasty  12/20/2011    Procedure: TOTAL KNEE ARTHROPLASTY;  Surgeon: Gearlean Alf, MD;  Location: [facility];  Service: Orthopedics;  Laterality: Left;  . Back surgery     Past Medical History  Diagnosis Date  . Hyperlipidemia   . HTN (hypertension)   . Status post placement of cardiac pacemaker DDD-  06-04-10-- LAST CHECK 09-08-10 IN EPIC    SECONDARY TO SYNCOPY AND BRADYCARDIA  . Restless leg syndrome   . Normal nuclear stress test 2008    LOW RISK--   DR  WALL  . Carotid stenosis, bilateral PER DR EARLY / DUPLEX  09-09-10  40 - 59%   BILATERALLY--- ASYMPTOMATIC  . History of echocardiogram 11-21-2006    EF 65%  . History of prostate cancer S/P RADIATION  TX  6 YRS AGO  . Psoriasis   . Arthritis with psoriasis   . GERD (gastroesophageal reflux disease)     CONTROLLED W/ PROTONIX  . Borderline diabetes     DIET CONTROLLED - PT STATES HE IS NOT DIABETIC  . Lumbar spondylosis W/ RADICULOPATHY  . Iron deficiency   . Cataract immature LEFT EYE  . PAF (paroxysmal atrial fibrillation)   . SVT (supraventricular tachycardia)     S/P ABLATION   2008  . Coronary artery disease CARDIOLOGIST- DR Crissie Sickles    09-08-10 VISIT AND PACEMAKER CHECK  IN EPIC  . Cancer     HX OF PROSTATE CANCER--TX'D WITH RADIATION  . Sleep apnea TESTED YRS AGO-- NO CPAP RX GIVEN    PT STATES HE DOES NOT THINK HE STILL HAS SLEEP APNEA--DOES NOT USE CPAP  . Stroke    BP 121/63   Pulse 88  Resp 14  Ht 6\' 3"  (1.905 m)  Wt 228 lb (103.42 kg)  BMI 28.50 kg/m2  SpO2 97%  Opioid Risk Score:   Fall Risk Score: High Fall Risk (>13 points) (pt educated and given brochure on fall risk previously)    Review of Systems  Genitourinary:       Bladder control problems  Skin: Positive for rash.  Neurological: Positive for dizziness and tremors.  Hematological: Bruises/bleeds easily.  All other systems reviewed and are negative.       Objective:   Physical Exam  Neurological: He is alert and oriented to person, place, and time. He displays no atrophy. No sensory deficit. He exhibits normal muscle tone. Coordination and gait abnormal.  Motor strength is 4/5 in the left deltoid, bicep, tricep, grip 5/5 in bilateral hip flexors knee extensors ankle dorsiflexors and plantar flexor 5/5 in the right deltoid, bicep and tricep and grip Ambulates without a straight cane   unable to do tandem gait  Decreased fine motor coordination left hand   Psychiatric: He has a normal mood and affect. His speech is delayed. He is slowed. He exhibits normal recent memory.         Assessment & Plan:  Right MCA infarct with cognitive deficits, visual perceptual deficits, residual left upper extremity weakness as well as decreased balance. Recommend continued home exercise program Recommend holding out for another 6 weeks for a behind the wheel driving evaluation Return to clinic 6 weeks

## 2013-10-03 ENCOUNTER — Telehealth: Payer: Self-pay

## 2013-10-03 NOTE — Telephone Encounter (Signed)
Patient wife called regarding patient.  He is having some discomfort with acid reflux that is worse than normal.  Patient has also had some issues with memory.  Advised patient wife to take him to the ER to be evaluated.  He has history of stroke.

## 2013-10-15 ENCOUNTER — Ambulatory Visit (INDEPENDENT_AMBULATORY_CARE_PROVIDER_SITE_OTHER): Payer: Commercial Managed Care - HMO | Admitting: Internal Medicine

## 2013-10-15 ENCOUNTER — Encounter: Payer: Self-pay | Admitting: Internal Medicine

## 2013-10-15 VITALS — BP 140/72 | HR 80 | Temp 97.9°F | Wt 230.0 lb

## 2013-10-15 DIAGNOSIS — R413 Other amnesia: Secondary | ICD-10-CM

## 2013-10-15 DIAGNOSIS — E538 Deficiency of other specified B group vitamins: Secondary | ICD-10-CM

## 2013-10-15 DIAGNOSIS — G4733 Obstructive sleep apnea (adult) (pediatric): Secondary | ICD-10-CM

## 2013-10-15 NOTE — Patient Instructions (Signed)
The patient is instructed to continue all medications as prescribed. Schedule followup with check out clerk upon leaving the clinic  

## 2013-10-15 NOTE — Progress Notes (Signed)
Subjective:    Patient ID: Christopher Baker, male    DOB: Aug 20, 1933, 80 y.o.   MRN: [id number]  HPI Has been running a pulse that has been elevated, di. Reassurance for  This but the family did not note if it is irregular or not. Blood pressure chart  Has a rate from 80 to 60. Increased day time somnolence. He has noted increased ay time somnolence Stopped the lipitor due to fatigue Needs sleep study and resumed the CPAP . Will place an order  Labs today for b12 and folate and thiamine      Review of Systems  Constitutional: Negative for fever and fatigue.  HENT: Negative for congestion, hearing loss and postnasal drip.   Eyes: Negative for discharge, redness and visual disturbance.  Respiratory: Negative for cough, shortness of breath and wheezing.   Cardiovascular: Negative for leg swelling.  Gastrointestinal: Negative for abdominal pain, constipation and abdominal distention.  Genitourinary: Negative for urgency and frequency.  Musculoskeletal: Negative for arthralgias, joint swelling and neck pain.  Skin: Negative for color change and rash.  Neurological: Negative for weakness and light-headedness.  Hematological: Negative for adenopathy.  Psychiatric/Behavioral: Negative for behavioral problems.   Past Medical History  Diagnosis Date  . Hyperlipidemia   . HTN (hypertension)   . Status post placement of cardiac pacemaker DDD-  06-04-10-- LAST CHECK 09-08-10 IN EPIC    SECONDARY TO SYNCOPY AND BRADYCARDIA  . Restless leg syndrome   . Normal nuclear stress test 2008    LOW RISK--   DR  WALL  . Carotid stenosis, bilateral PER DR EARLY / DUPLEX  09-09-10      40 - 59%   BILATERALLY--- ASYMPTOMATIC  . History of echocardiogram 11-21-2006    EF 65%  . History of prostate cancer S/P RADIATION  TX  6 YRS AGO  . Psoriasis   . Arthritis with psoriasis   . GERD (gastroesophageal reflux disease)     CONTROLLED W/ PROTONIX  . Borderline diabetes     DIET CONTROLLED - PT  STATES HE IS NOT DIABETIC  . Lumbar spondylosis W/ RADICULOPATHY  . Iron deficiency   . Cataract immature LEFT EYE  . PAF (paroxysmal atrial fibrillation)   . SVT (supraventricular tachycardia)     S/P ABLATION   2008  . Coronary artery disease CARDIOLOGIST- DR Crissie Sickles    09-08-10 VISIT AND PACEMAKER CHECK  IN EPIC  . Cancer     HX OF PROSTATE CANCER--TX'D WITH RADIATION  . Sleep apnea TESTED YRS AGO-- NO CPAP RX GIVEN    PT STATES HE DOES NOT THINK HE STILL HAS SLEEP APNEA--DOES NOT USE CPAP  . Stroke     History   Social History  . Marital Status: Married    Spouse Name: N/A    Number of Children: N/A  . Years of Education: N/A   Occupational History  . retired    Social History Main Topics  . Smoking status: Former Smoker -- 0.10 packs/day for 55 years    Types: Cigarettes  . Smokeless tobacco: Never Used  . Alcohol Use: Yes     Comment: RARE  . Drug Use: No  . Sexual Activity: Yes   Other Topics Concern  . Not on file   Social History Narrative  . No narrative on file    Past Surgical History  Procedure Laterality Date  . Replacement total knee  1997    RIGHT  . Cardiac pacemaker placement  06-04-10  DDD/ AND REMOVAL LOOR RECORDER  . Loop recorder placement  01-30-10  . Lumbar laminectomy/ diskectomy/ fusion  05-19-10    L4 - 5  . Cholecystectomy  2009  . Cardiac electrophysiology study and ablation  2008    FOR SVT  . Prostate biopsy  2007  . Transurethral resection of prostate  2006  . Inguinal hernia repair  2005    LEFT/   DONE WITH PENILE PROSTESIS SURG.  . Removal and placement penile prosthesis implant   2005    AND LEFT CORPOROPLASTY /    DONE INGUINAL REPAIR  . Penile prosthesis implant  1995  . Knee arthroscopy  06/02/2011    Procedure: ARTHROSCOPY KNEE;  Surgeon: Gearlean Alf;  Location: [facility];  Service: Orthopedics;  Laterality: Left;  LEFT KNEE ARTHROSCOPY WITH DEBRIDEMENT  . Total knee arthroplasty   12/20/2011    Procedure: TOTAL KNEE ARTHROPLASTY;  Surgeon: Gearlean Alf, MD;  Location: [facility];  Service: Orthopedics;  Laterality: Left;  . Back surgery      Family History  Problem Relation Age of Onset  . Stroke Mother   . Early death Father     Allergies  Allergen Reactions  . Ciprofloxacin Nausea Only    REACTION: GI upset  . Other     SCOLLOPS--SUDDEN GI ATTACK    Current Outpatient Prescriptions on File Prior to Visit  Medication Sig Dispense Refill  . amLODipine (NORVASC) 5 MG tablet Take 1 tablet (5 mg total) by mouth daily.  90 tablet  3  . atorvastatin (LIPITOR) 20 MG tablet Take 1 tablet (20 mg total) by mouth daily. TAKE ONE TABLET BY MOUTH DAILY  90 tablet  3  . citalopram (CELEXA) 20 MG tablet TAKE 1 TABLET (20 MG TOTAL) BY MOUTH DAILY.  90 tablet  3  . clotrimazole-betamethasone (LOTRISONE) cream Apply 1 application topically 2 (two) times daily. For psoriasis.apply to affected are twice a day  90 g  3  . Cyanocobalamin (VITAMIN B-12 IJ) Inject as directed. Every two weeks      . diclofenac sodium (VOLTAREN) 1 % GEL Apply 1 application topically 2 (two) times daily as needed. Arthritis in both thumbs.  300 g  3  . ezetimibe (ZETIA) 10 MG tablet Take 1 tablet (10 mg total) by mouth daily.  90 tablet  3  . ipratropium (ATROVENT) 0.06 % nasal spray Place 2 sprays into both nostrils 4 (four) times daily.  45 mL  3  . iron polysaccharides (NIFEREX) 150 MG capsule Take 150 mg by mouth 2 (two) times daily.      Marland Kitchen lisinopril (PRINIVIL,ZESTRIL) 10 MG tablet Take 1 tablet (10 mg total) by mouth 2 (two) times daily.  180 tablet  3  . methotrexate (RHEUMATREX) 2.5 MG tablet TAKE FOUR TABLETS BY MOUTH ONCE A WEEK.  16 tablet  3  . mirabegron ER (MYRBETRIQ) 50 MG TB24 tablet Take 1 tablet (50 mg total) by mouth daily.  90 tablet  3  . pantoprazole (PROTONIX) 40 MG tablet Take 1 tablet (40 mg total) by mouth daily.  90 tablet  3  . predniSONE (DELTASONE) 5 MG tablet Take 1.5  tablets (7.5 mg total) by mouth daily.  135 tablet  3  . Rivaroxaban (XARELTO) 20 MG TABS tablet Take 1 tablet (20 mg total) by mouth daily with supper. This is the new medication for stroke prevention  90 tablet  3  . rOPINIRole (REQUIP) 2 MG tablet Take 1  tablet (2 mg total) by mouth at bedtime.  90 tablet  3  . solifenacin (VESICARE) 10 MG tablet Take 1 tablet (10 mg total) by mouth daily. Use vesicare or ditropan--not both  90 tablet  3  . SYRINGE-NEEDLE, DISP, 3 ML (BD ECLIPSE SYRINGE) 25G X 5/8" 3 ML MISC 1 each by Does not apply route every 14 (fourteen) days.  50 each  1   No current facility-administered medications on file prior to visit.    BP 140/72  Pulse 80  Temp(Src) 97.9 F (36.6 C) (Oral)  Wt 230 lb (104.327 kg)       Objective:   Physical Exam  Nursing note and vitals reviewed. Constitutional: He is oriented to person, place, and time. He appears well-developed and well-nourished.  HENT:  Head: Normocephalic and atraumatic.  Eyes: Conjunctivae are normal. Pupils are equal, round, and reactive to light.  Neck: Normal range of motion. Neck supple.  Cardiovascular: Normal rate and regular rhythm.   Pulmonary/Chest: Effort normal and breath sounds normal.  Abdominal: Soft. Bowel sounds are normal.  Neurological: He is alert and oriented to person, place, and time.  Psychiatric: He has a normal mood and affect. His behavior is normal.          Assessment & Plan:  HTN Atrial fib Stress relief through golf No alcohol

## 2013-10-16 LAB — VITAMIN B12: Vitamin B-12: 478 pg/mL (ref 211–911)

## 2013-10-16 LAB — FOLATE: Folate: 13 ng/mL (ref >5.9)

## 2013-10-20 LAB — VITAMIN B1: VITAMIN B1 (THIAMINE): 10 nmol/L (ref 8–30)

## 2013-10-22 ENCOUNTER — Encounter: Payer: Self-pay | Admitting: Internal Medicine

## 2013-10-22 DIAGNOSIS — R413 Other amnesia: Secondary | ICD-10-CM

## 2013-10-22 DIAGNOSIS — G4723 Circadian rhythm sleep disorder, irregular sleep wake type: Secondary | ICD-10-CM

## 2013-10-22 DIAGNOSIS — E538 Deficiency of other specified B group vitamins: Secondary | ICD-10-CM

## 2013-10-29 ENCOUNTER — Encounter: Payer: Self-pay | Admitting: Vascular Surgery

## 2013-11-05 ENCOUNTER — Encounter: Payer: Self-pay | Admitting: Internal Medicine

## 2013-11-09 ENCOUNTER — Encounter: Payer: Self-pay | Admitting: Physical Medicine & Rehabilitation

## 2013-11-09 ENCOUNTER — Encounter: Payer: Medicare PPO | Attending: Physical Medicine & Rehabilitation

## 2013-11-09 ENCOUNTER — Ambulatory Visit (HOSPITAL_BASED_OUTPATIENT_CLINIC_OR_DEPARTMENT_OTHER): Payer: Medicare PPO | Admitting: Physical Medicine & Rehabilitation

## 2013-11-09 VITALS — BP 154/70 | HR 90 | Resp 14 | Ht 75.0 in | Wt 232.0 lb

## 2013-11-09 DIAGNOSIS — I69998 Other sequelae following unspecified cerebrovascular disease: Secondary | ICD-10-CM | POA: Insufficient documentation

## 2013-11-09 DIAGNOSIS — R29898 Other symptoms and signs involving the musculoskeletal system: Secondary | ICD-10-CM | POA: Insufficient documentation

## 2013-11-09 DIAGNOSIS — I69993 Ataxia following unspecified cerebrovascular disease: Secondary | ICD-10-CM | POA: Insufficient documentation

## 2013-11-09 DIAGNOSIS — M1990 Unspecified osteoarthritis, unspecified site: Secondary | ICD-10-CM | POA: Insufficient documentation

## 2013-11-09 DIAGNOSIS — M25519 Pain in unspecified shoulder: Secondary | ICD-10-CM | POA: Insufficient documentation

## 2013-11-09 DIAGNOSIS — H539 Unspecified visual disturbance: Secondary | ICD-10-CM | POA: Insufficient documentation

## 2013-11-09 DIAGNOSIS — I6340 Cerebral infarction due to embolism of unspecified cerebral artery: Secondary | ICD-10-CM

## 2013-11-09 DIAGNOSIS — I69919 Unspecified symptoms and signs involving cognitive functions following unspecified cerebrovascular disease: Secondary | ICD-10-CM | POA: Insufficient documentation

## 2013-11-09 DIAGNOSIS — H5333 Simultaneous visual perception without fusion: Secondary | ICD-10-CM | POA: Insufficient documentation

## 2013-11-09 NOTE — Progress Notes (Signed)
Subjective:    Patient ID: Christopher Baker, male    DOB: [birth year], 80 y.o.   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 General activity 0 Relation with others 0 Enjoyment of life 2 What TIME of day is your pain at its worst? daytime Sleep (in general) Good  Pain is worse with: some activites Pain improves with: medication Relief from Meds: 6  Mobility walk without assistance ability to climb steps?  yes do you drive?  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  no  Physicians involved in your care Any changes since last visit?  no   Family History  Problem Relation Age of Onset  . Stroke Mother   . Early death Father    History   Social History  . Marital Status: Married    Spouse Name: N/A    Number of Children: N/A  . Years of Education: N/A   Occupational History  . retired    Social History Main Topics  . Smoking status: Former Smoker -- 0.10 packs/day for 55 years    Types: Cigarettes  . Smokeless tobacco: Never Used  . Alcohol Use: Yes     Comment: RARE  . Drug Use: No  . Sexual Activity: Yes   Other Topics Concern  . None   Social History Narrative  . None   Past Surgical History  Procedure Laterality Date  . Replacement total knee  1997    RIGHT  . Cardiac pacemaker placement  06-04-10    DDD/ AND REMOVAL LOOR RECORDER  . Loop recorder placement  01-30-10  . Lumbar laminectomy/ diskectomy/ fusion  05-19-10    L4 - 5  . Cholecystectomy  2009  . Cardiac electrophysiology study  and ablation  2008    FOR SVT  . Prostate biopsy  2007  . Transurethral resection of prostate  2006  . Inguinal hernia repair  2005    LEFT/   DONE WITH PENILE PROSTESIS SURG.  . Removal and placement penile prosthesis implant   2005    AND LEFT CORPOROPLASTY /    DONE INGUINAL REPAIR  . Penile prosthesis implant  1995  . Knee arthroscopy  06/02/2011    Procedure: ARTHROSCOPY KNEE;  Surgeon: Gearlean Alf;  Location: [facility];  Service: Orthopedics;  Laterality: Left;  LEFT KNEE ARTHROSCOPY WITH DEBRIDEMENT  . Total knee arthroplasty  12/20/2011    Procedure: TOTAL KNEE ARTHROPLASTY;  Surgeon: Gearlean Alf, MD;  Location: [facility];  Service: Orthopedics;  Laterality: Left;  . Back surgery     Past Medical History  Diagnosis Date  . Hyperlipidemia   . HTN (hypertension)   . Status post placement of cardiac pacemaker DDD-  06-04-10-- LAST CHECK 09-08-10 IN EPIC    SECONDARY TO SYNCOPY AND BRADYCARDIA  . Restless leg syndrome   . Normal nuclear stress test 2008    LOW RISK--   DR  WALL  . Carotid stenosis, bilateral PER DR EARLY / DUPLEX  09-09-10  40 - 59%   BILATERALLY--- ASYMPTOMATIC  . History of echocardiogram 11-21-2006    EF 65%  . History of prostate cancer S/P RADIATION  TX  6 YRS AGO  . Psoriasis   . Arthritis with psoriasis   . GERD (gastroesophageal reflux disease)     CONTROLLED W/ PROTONIX  . Borderline diabetes     DIET CONTROLLED - PT STATES HE IS NOT DIABETIC  . Lumbar spondylosis W/ RADICULOPATHY  . Iron deficiency   . Cataract immature LEFT EYE  . PAF (paroxysmal atrial fibrillation)   . SVT (supraventricular tachycardia)     S/P ABLATION   2008  . Coronary artery disease CARDIOLOGIST- DR Crissie Sickles    09-08-10 VISIT AND PACEMAKER CHECK  IN EPIC  . Cancer     HX OF PROSTATE CANCER--TX'D WITH RADIATION  . Sleep apnea TESTED YRS AGO-- NO CPAP RX GIVEN    PT STATES HE DOES NOT THINK HE STILL HAS SLEEP APNEA--DOES NOT USE CPAP  .  Stroke    BP 154/70  Pulse 90  Resp 14  Ht 6\' 3"  (1.905 m)  Wt 232 lb (105.235 kg)  BMI 29.00 kg/m2  SpO2 96%  Opioid Risk Score:   Fall Risk Score: High Fall Risk (>13 points) (pt educated on fall risk, brochure given to pt previously)    Review of Systems  Respiratory: Positive for apnea.   Neurological: Positive for tremors.  All other systems reviewed and are negative.      Objective:   Physical Exam  Neurological: He is alert and oriented to person, place, and time. He displays no atrophy. No sensory deficit. He exhibits normal muscle tone. Coordination and gait abnormal.  Motor strength is 4+/5 in the left deltoid, bicep, tricep, grip 5/5 in bilateral hip flexors knee extensors ankle dorsiflexors and plantar flexor 5/5 in the right deltoid, bicep and tricep and grip Ambulates without device unable to do tandem gait    Decreased sensation to pinprick in the right hand compared to the left hand.    Assessment & Plan:  Right MCA infarct with cognitive deficits, STM problems , visual perceptual deficits have improved, residual left upper extremity weakness as well as decreased balance.  Recommend continued home exercise program  Rec Optho eval Recommend holding out for another 6 weeks for a behind the wheel driving evaluation  Return to clinic 8wks  EMG of both upper extremities if numbness tingling symptoms progress  2.. Fatigue hx of moderate OSA which in past has responded to wt loss, will be retested.

## 2013-11-09 NOTE — Patient Instructions (Signed)
Please get ophthalmology evaluation before the driver's evaluation

## 2013-11-13 ENCOUNTER — Encounter: Payer: Self-pay | Admitting: Internal Medicine

## 2013-11-27 ENCOUNTER — Ambulatory Visit: Payer: Commercial Managed Care - HMO | Admitting: Family

## 2013-11-27 ENCOUNTER — Other Ambulatory Visit (HOSPITAL_COMMUNITY): Payer: Commercial Managed Care - HMO

## 2013-12-05 ENCOUNTER — Other Ambulatory Visit: Payer: Self-pay | Admitting: Internal Medicine

## 2013-12-05 ENCOUNTER — Encounter: Payer: Self-pay | Admitting: Family

## 2013-12-06 ENCOUNTER — Other Ambulatory Visit (HOSPITAL_COMMUNITY): Payer: Self-pay

## 2013-12-06 ENCOUNTER — Ambulatory Visit (INDEPENDENT_AMBULATORY_CARE_PROVIDER_SITE_OTHER): Payer: Commercial Managed Care - HMO | Admitting: Family

## 2013-12-06 ENCOUNTER — Ambulatory Visit (HOSPITAL_COMMUNITY)
Admission: RE | Admit: 2013-12-06 | Discharge: 2013-12-06 | Disposition: A | Discharge status: Home / Self Care | Payer: Medicare PPO | Source: Ambulatory Visit | Attending: Family | Admitting: Family

## 2013-12-06 ENCOUNTER — Encounter: Payer: Self-pay | Admitting: Family

## 2013-12-06 VITALS — BP 143/71 | HR 61 | Resp 16 | Ht 75.0 in | Wt 229.0 lb

## 2013-12-06 DIAGNOSIS — I6529 Occlusion and stenosis of unspecified carotid artery: Secondary | ICD-10-CM | POA: Insufficient documentation

## 2013-12-06 DIAGNOSIS — I658 Occlusion and stenosis of other precerebral arteries: Secondary | ICD-10-CM | POA: Insufficient documentation

## 2013-12-06 NOTE — Progress Notes (Signed)
Established Carotid Patient   History of Present Illness  Christopher Baker is a 80 y.o. male patient of Dr. Donnetta Hutching who has known carotid stenosis. He returns today for follow up. He had a stroke on 04/03/13 as manifested by mild left sided weakness, denies that he had any speech difficulties, denies confusion at that time, denies any loss of vision in either eye. He has been through rehab and has no residual weakness in extremities, no other residual neurological deficits.   Patient has not had previous carotid artery intervention. He denies claudication symptoms with walking, denies non healing wounds.  Pt denies New Medical or Surgical History other than the above.  Pt Diabetic: No Pt smoker: former smoker, quit in 2000  Pt meds include: Statin : Yes ASA: No Other anticoagulants/antiplatelets: Xaralto for history of atrial fib, also has a pacemaker.   Past Medical History  Diagnosis Date  . Hyperlipidemia   . HTN (hypertension)   . Status post placement of cardiac pacemaker DDD-  06-04-10-- LAST CHECK 09-08-10 IN EPIC    SECONDARY TO SYNCOPY AND BRADYCARDIA  . Restless leg syndrome   . Normal nuclear stress test 2008    LOW RISK--   DR  WALL  . Carotid stenosis, bilateral PER DR EARLY / DUPLEX  09-09-10      40 - 59%   BILATERALLY--- ASYMPTOMATIC  . History of echocardiogram 11-21-2006    EF 65%  . History of prostate cancer S/P RADIATION  TX  6 YRS AGO  . Psoriasis   . Arthritis with psoriasis   . GERD (gastroesophageal reflux disease)     CONTROLLED W/ PROTONIX  . Borderline diabetes     DIET CONTROLLED - PT STATES HE IS NOT DIABETIC  . Lumbar spondylosis W/ RADICULOPATHY  . Iron deficiency   . Cataract immature LEFT EYE  . PAF (paroxysmal atrial fibrillation)   . SVT (supraventricular tachycardia)     S/P ABLATION   2008  . Coronary artery disease CARDIOLOGIST- DR Crissie Sickles    09-08-10 VISIT AND PACEMAKER CHECK  IN EPIC  . Cancer     HX OF PROSTATE  CANCER--TX'D WITH RADIATION  . Sleep apnea TESTED YRS AGO-- NO CPAP RX GIVEN    PT STATES HE DOES NOT THINK HE STILL HAS SLEEP APNEA--DOES NOT USE CPAP  . Stroke Oct. 14, 2014    Social History History  Substance Use Topics  . Smoking status: Former Smoker -- 0.10 packs/day for 55 years    Types: Cigarettes    Quit date: 12/07/1998  . Smokeless tobacco: Never Used  . Alcohol Use: Yes     Comment: RARE    Family History Family History  Problem Relation Age of Onset  . Stroke Mother   . Early death Father     Surgical History Past Surgical History  Procedure Laterality Date  . Replacement total knee  1997    RIGHT  . Cardiac pacemaker placement  06-04-10    DDD/ AND REMOVAL LOOR RECORDER  . Loop recorder placement  01-30-10  . Lumbar laminectomy/ diskectomy/ fusion  05-19-10    L4 - 5  . Cholecystectomy  2009  . Cardiac electrophysiology study and ablation  2008    FOR SVT  . Prostate biopsy  2007  . Transurethral resection of prostate  2006  . Inguinal hernia repair  2005    LEFT/   DONE WITH PENILE PROSTESIS SURG.  . Removal and placement penile prosthesis implant  2005    AND LEFT CORPOROPLASTY /    DONE INGUINAL REPAIR  . Penile prosthesis implant  1995  . Knee arthroscopy  06/02/2011    Procedure: ARTHROSCOPY KNEE;  Surgeon: Gearlean Alf;  Location: [facility];  Service: Orthopedics;  Laterality: Left;  LEFT KNEE ARTHROSCOPY WITH DEBRIDEMENT  . Total knee arthroplasty  12/20/2011    Procedure: TOTAL KNEE ARTHROPLASTY;  Surgeon: Gearlean Alf, MD;  Location: [facility];  Service: Orthopedics;  Laterality: Left;  . Back surgery    . Joint replacement    . Spine surgery      Allergies  Allergen Reactions  . Ciprofloxacin Nausea Only    REACTION: GI upset  . Other Other (See Comments)    SCOLLOPS--SUDDEN GI ATTACK    Current Outpatient Prescriptions  Medication Sig Dispense Refill  . amLODipine (NORVASC) 5 MG tablet Take 1 tablet (5 mg  total) by mouth daily.  90 tablet  3  . atorvastatin (LIPITOR) 20 MG tablet Take 1 tablet (20 mg total) by mouth daily. TAKE ONE TABLET BY MOUTH DAILY  90 tablet  3  . citalopram (CELEXA) 20 MG tablet TAKE 1 TABLET (20 MG TOTAL) BY MOUTH DAILY.  90 tablet  3  . clotrimazole-betamethasone (LOTRISONE) cream Apply 1 application topically 2 (two) times daily. For psoriasis.apply to affected are twice a day  90 g  3  . Cyanocobalamin (VITAMIN B-12 IJ) Inject as directed. Every two weeks      . diclofenac sodium (VOLTAREN) 1 % GEL Apply 1 application topically 2 (two) times daily as needed. Arthritis in both thumbs.  300 g  3  . ezetimibe (ZETIA) 10 MG tablet Take 1 tablet (10 mg total) by mouth daily.  90 tablet  3  . ipratropium (ATROVENT) 0.06 % nasal spray Place 2 sprays into both nostrils 4 (four) times daily.  45 mL  3  . iron polysaccharides (NIFEREX) 150 MG capsule Take 150 mg by mouth 2 (two) times daily.      Marland Kitchen lisinopril (PRINIVIL,ZESTRIL) 10 MG tablet Take 1 tablet (10 mg total) by mouth 2 (two) times daily.  180 tablet  3  . methotrexate (RHEUMATREX) 2.5 MG tablet TAKE FOUR TABLETS BY MOUTH ONCE A WEEK.  16 tablet  3  . mirabegron ER (MYRBETRIQ) 50 MG TB24 tablet Take 1 tablet (50 mg total) by mouth daily.  90 tablet  3  . pantoprazole (PROTONIX) 40 MG tablet Take 1 tablet (40 mg total) by mouth daily.  90 tablet  3  . predniSONE (DELTASONE) 5 MG tablet Take 1.5 tablets (7.5 mg total) by mouth daily.  135 tablet  3  . Rivaroxaban (XARELTO) 20 MG TABS tablet Take 1 tablet (20 mg total) by mouth daily with supper. This is the new medication for stroke prevention  90 tablet  3  . rOPINIRole (REQUIP) 2 MG tablet Take 1 tablet (2 mg total) by mouth at bedtime.  90 tablet  3  . solifenacin (VESICARE) 10 MG tablet Take 1 tablet (10 mg total) by mouth daily. Use vesicare or ditropan--not both  90 tablet  3  . SYRINGE-NEEDLE, DISP, 3 ML (BD ECLIPSE SYRINGE) 25G X 5/8" 3 ML MISC 1 each by Does not  apply route every 14 (fourteen) days.  50 each  1   No current facility-administered medications for this visit.    Review of Systems : See HPI for pertinent positives and negatives.  Physical Examination   Filed Vitals:  12/06/13 1140 12/06/13 1143  BP: 141/68 143/71  Pulse: 62 61  Resp:  16  Height:  6\' 3"  (1.905 m)  Weight:  229 lb (103.874 kg)  SpO2:  98%   Body mass index is 28.62 kg/(m^2).  General: WDWN male in NAD GAIT: normal Eyes: PERRLA Pulmonary:  Non-labored, CTAB, Negative  Rales, Negative rhonchi, & Negative wheezing.  Cardiac: regular Rhythm ,  Positive detected murmur. 1-2+ bilateral pretibial pitting edema, knee high compression hose in place.  VASCULAR EXAM Carotid Bruits Left Right   Transmitted cardiac murmur Transmitted cardiac murmur    Radial pulses are 2+ palpable and equal.                                                                                                                            LE Pulses LEFT RIGHT       POPLITEAL  not palpable   not palpable    Gastrointestinal: soft, nontender, BS WNL, no r/g,  negative masses.  Musculoskeletal: Negative muscle atrophy/wasting. M/S 5/5 throughout, Extremities without ischemic changes.  Neurologic: A&O X 3; Appropriate Affect ; SENSATION ;normal;  Speech is normal CN 2-12 intact, Pain and light touch intact in extremities, Motor exam as listed above.   Non-Invasive Vascular Imaging CAROTID DUPLEX 12/06/2013   CEREBROVASCULAR DUPLEX EVALUATION    INDICATION: Follow-up carotid disease     PREVIOUS INTERVENTION(S):     DUPLEX EXAM:     RIGHT  LEFT  Peak Systolic Velocities (cm/s) End Diastolic Velocities (cm/s) Plaque LOCATION Peak Systolic Velocities (cm/s) End Diastolic Velocities (cm/s) Plaque  92 14  CCA PROXIMAL 93 18   79 13  CCA MID 79 18   62 16  CCA DISTAL 65 15   167 26  ECA 175 25   150 43 HT ICA PROXIMAL 77 17 HT  180 49 HT ICA MID 185 48 HT  159 30  ICA DISTAL 141  30     2.9 ICA / CCA Ratio (PSV) 2.8  Antegrade  Vertebral Flow Antegrade   250 Brachial Systolic Pressure (mmHg) 539  Within normal limits  Brachial Artery Waveforms Within normal limits     Plaque Morphology:  HM = Homogeneous, HT = Heterogeneous, CP = Calcific Plaque, SP = Smooth Plaque, IP = Irregular Plaque     ADDITIONAL FINDINGS:     IMPRESSION: 1. Evidence of 40%-59% stenosis of the bilateral internal carotid artery. 2. Bilateral vertebral artery is antegrade.    Compared to the previous exam:  No significant change compared to prior exam.     Assessment: ABRIAN HANOVER is a 80 y.o. male who presents with asymptomatic 40 - 59 % Bilateral ICA  Stenosis. The  ICA stenosis is  Unchanged from previous exam. He had a stroke on 04/03/13 as manifested by mild left sided weakness, denies that he had any speech difficulties, denies confusion at that time, denies any loss of vision in either eye. He has  no residual neurological deficit. No further stroke or TIA activity since 04/03/13.  Plan: Based on today's exam and Duplex result, and after discussing with Dr. Donnetta Hutching, patient advised to follow-up in 1 year with Carotid Duplex scan.   I discussed in depth with the patient the nature of atherosclerosis, and emphasized the importance of maximal medical management including strict control of blood pressure, blood glucose, and lipid levels, obtaining regular exercise, and continued cessation of smoking.  The patient is aware that without maximal medical management the underlying atherosclerotic disease process will progress, limiting the benefit of any interventions. The patient was given information about stroke prevention and what symptoms should prompt the patient to seek immediate medical care. Thank you for allowing [place] to participate in this patient's care.  Clemon Chambers, RN, MSN, FNP-C [facility] Office: [phone]  Clinic Physician:  Early  12/06/2013 11:58 AM

## 2013-12-06 NOTE — Patient Instructions (Signed)
Stroke Prevention Some medical conditions and behaviors are associated with an increased chance of having a stroke. You may prevent a stroke by making healthy choices and managing medical conditions. HOW CAN I REDUCE MY RISK OF HAVING A STROKE?   Stay physically active. Get at least 30 minutes of activity on most or all days.  Do not smoke. It may also be helpful to avoid exposure to secondhand smoke.  Limit alcohol use. Moderate alcohol use is considered to be:  No more than 2 drinks per day for men.  No more than 1 drink per day for nonpregnant women.  Eat healthy foods. This involves  Eating 5 or more servings of fruits and vegetables a day.  Following a diet that addresses high blood pressure (hypertension), high cholesterol, diabetes, or obesity.  Manage your cholesterol levels.  A diet low in saturated fat, trans fat, and cholesterol and high in fiber may control cholesterol levels.  Take any prescribed medicines to control cholesterol as directed by your health care provider.  Manage your diabetes.  A controlled-carbohydrate, controlled-sugar diet is recommended to manage diabetes.  Take any prescribed medicines to control diabetes as directed by your health care provider.  Control your hypertension.  A low-salt (sodium), low-saturated fat, low-trans fat, and low-cholesterol diet is recommended to manage hypertension.  Take any prescribed medicines to control hypertension as directed by your health care provider.  Maintain a healthy weight.  A reduced-calorie, low-sodium, low-saturated fat, low-trans fat, low-cholesterol diet is recommended to manage weight.  Stop drug abuse.  Avoid taking birth control pills.  Talk to your health care provider about the risks of taking birth control pills if you are over 35 years old, smoke, get migraines, or have ever had a blood clot.  Get evaluated for sleep disorders (sleep apnea).  Talk to your health care provider about  getting a sleep evaluation if you snore a lot or have excessive sleepiness.  Take medicines as directed by your health care provider.  For some people, aspirin or blood thinners (anticoagulants) are helpful in reducing the risk of forming abnormal blood clots that can lead to stroke. If you have the irregular heart rhythm of atrial fibrillation, you should be on a blood thinner unless there is a good reason you cannot take them.  Understand all your medicine instructions.  Make sure that other other conditions (such as anemia or atherosclerosis) are addressed. SEEK IMMEDIATE MEDICAL CARE IF:   You have sudden weakness or numbness of the face, arm, or leg, especially on one side of the body.  Your face or eyelid droops to one side.  You have sudden confusion.  You have trouble speaking (aphasia) or understanding.  You have sudden trouble seeing in one or both eyes.  You have sudden trouble walking.  You have dizziness.  You have a loss of balance or coordination.  You have a sudden, severe headache with no known cause.  You have new chest pain or an irregular heartbeat. Any of these symptoms may represent a serious problem that is an emergency. Do not wait to see if the symptoms will go away. Get medical help at once. Call your local emergency services  (911 in [place] Do not drive yourself to the hospital. Document Released: 07/15/2004 Document Revised: 03/28/2013 Document Reviewed: 12/08/2012 [employer] Patient Information 2015 [employer]. This information is not intended to replace advice given to you by your health care provider. Make sure you discuss any questions you have with your health   care provider.  

## 2013-12-20 ENCOUNTER — Ambulatory Visit (HOSPITAL_BASED_OUTPATIENT_CLINIC_OR_DEPARTMENT_OTHER): Payer: Medicare PPO | Attending: Internal Medicine

## 2013-12-20 DIAGNOSIS — G4723 Circadian rhythm sleep disorder, irregular sleep wake type: Secondary | ICD-10-CM

## 2013-12-20 DIAGNOSIS — R413 Other amnesia: Secondary | ICD-10-CM

## 2013-12-20 DIAGNOSIS — I4949 Other premature depolarization: Secondary | ICD-10-CM | POA: Insufficient documentation

## 2013-12-20 DIAGNOSIS — E538 Deficiency of other specified B group vitamins: Secondary | ICD-10-CM

## 2013-12-20 DIAGNOSIS — G4730 Sleep apnea, unspecified: Principal | ICD-10-CM

## 2013-12-20 DIAGNOSIS — G4710 Hypersomnia, unspecified: Secondary | ICD-10-CM | POA: Insufficient documentation

## 2013-12-27 ENCOUNTER — Other Ambulatory Visit: Payer: Self-pay | Admitting: Internal Medicine

## 2013-12-27 ENCOUNTER — Encounter: Payer: Self-pay | Admitting: Neurology

## 2013-12-27 ENCOUNTER — Ambulatory Visit (INDEPENDENT_AMBULATORY_CARE_PROVIDER_SITE_OTHER): Payer: Medicare PPO | Admitting: Neurology

## 2013-12-27 VITALS — BP 110/52 | HR 66 | Temp 98.0°F | Resp 18 | Ht 75.0 in | Wt 232.2 lb

## 2013-12-27 DIAGNOSIS — I6340 Cerebral infarction due to embolism of unspecified cerebral artery: Secondary | ICD-10-CM

## 2013-12-27 DIAGNOSIS — R259 Unspecified abnormal involuntary movements: Secondary | ICD-10-CM

## 2013-12-27 DIAGNOSIS — I69919 Unspecified symptoms and signs involving cognitive functions following unspecified cerebrovascular disease: Secondary | ICD-10-CM

## 2013-12-27 DIAGNOSIS — R251 Tremor, unspecified: Secondary | ICD-10-CM

## 2013-12-27 NOTE — Progress Notes (Signed)
NEUROLOGY FOLLOW UP OFFICE NOTE  Christopher Baker [id number]  HISTORY OF PRESENT ILLNESS: Christopher Baker is a 80 year old right-handed man with history of hypertension, hyperlipidemia, paroxysmal atrial fibrillation with pacemaker but no AC, and prostate cancer status post radiation in 2007, who follows up for memory, cardio-embolic strokes and tremor.  He is accompanied by his wife.  Records and images were personally reviewed where available.    UPDATE: 10/15/13 LABS:  B12 478, folate 13, B1 10 05/21/13 LABS:  LDL 62, TSH 1.91 He underwent extensive rehab after the stroke and is doing well.  He still has short term memory issues.  He has some good days and bad days.  He continued to be fatigued despite discontinuing the sleep aid.  He had a sleep study and was found to have OSA.  He has a CPAP ordered.  He feels that he has trouble grasping objects with his left hand.  He has problems with his golf swing.  He still has mild tremor bilaterally.  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  Since the stroke, he is more irritable, particularly because he doesn't feel as independent anymore.  He had formal neuropsych testing performed by Dr. Valentina Baker earlier this year.  He reportedly did well.  No follow up was recommended. He reportedly had a formal driving evaluation earlier this week.  He did well.  He was told to drive with someone else in the car for a couple of months so he can regain confidence.  The only restriction was that he should not drive at night unless it is a well-lit road.  Marland Kitchen  HISTORY: I.  Cardio-embolic stroke He was admitted to the hospital on 04/03/13 with left hand and arm weakness and facial droop.  CT head revealed right basal ganglia and parietal lobe infarcts.  CTA of head revealed no major intracranial stenosis or occlusion.  Carotid dopplers revealed bilateral 40-59% ICA stenosis.  2D Echo  revealed EF 55% and grade 1 diastolic dysfunction. Hgb A1c was 5.5.  LDL was 88.  He was found to have atrial fibrillation.  He was recommended to start Xarelto.  He has been through rehab and is without residual weakness.  He did see vascular surgery regarding carotid disease.  Follow up carotid duplex in one year was recommended.  II. Memory problems Over the last 2.5 years, he has had problems with memory.  He was repeating things and exhibited mental fogginess and lack of motivation.  He was experiencing depression and insomnia as well.  Procedural tasks and executive functioning were intact.  He was found to have a B12 level of 52 and was started on B12 shots.  He and his wife had noticed improvement in his memory.  However, insomnia and depression were still both an issue.  He had been under a lot of stress.  He was started on Celexa for depression and Proson for insomnia.  Last year, he exhibited extreme lethargy, to the point where he was sleeping during the day, which he never previously done.  Sometimes, he was in such a deep sleep, it required repeated yelling and shaking to wake him up.  He seemed more "foggy" again.  One time, he couldn't figure out where to put the ice cream in the refrigerator.  When his wife told him to put it in the freezer, he was placing it  in the vegetable drawer.  He decided to stop the Proson, and he and his wife noticed significant improvement in lethargy.  He did note some improvement in memory since starting B12 supplementation.  Memory seemed worse after the stroke, so cognitive deficits were thought to be also related to stroke.  MOCA score on 02/22/13 was 27/30.    III Tremor Last year, he began noticing mild tremor in his hands, noticeable when he holds a utensil or writes.  He first noticed this about a month or so ago.  He has some balance issues due to bilateral knee replacement surgeries.  No family history of dementia or tremor (possibly his mother had  tremor).  PAST MEDICAL HISTORY: Past Medical History  Diagnosis Date  . Hyperlipidemia   . HTN (hypertension)   . Status post placement of cardiac pacemaker DDD-  06-04-10-- LAST CHECK 09-08-10 IN EPIC    SECONDARY TO SYNCOPY AND BRADYCARDIA  . Restless leg syndrome   . Normal nuclear stress test 2008    LOW RISK--   DR  WALL  . Carotid stenosis, bilateral PER DR EARLY / DUPLEX  09-09-10      40 - 59%   BILATERALLY--- ASYMPTOMATIC  . History of echocardiogram 11-21-2006    EF 65%  . History of prostate cancer S/P RADIATION  TX  6 YRS AGO  . Psoriasis   . Arthritis with psoriasis   . GERD (gastroesophageal reflux disease)     CONTROLLED W/ PROTONIX  . Borderline diabetes     DIET CONTROLLED - PT STATES HE IS NOT DIABETIC  . Lumbar spondylosis W/ RADICULOPATHY  . Iron deficiency   . Cataract immature LEFT EYE  . PAF (paroxysmal atrial fibrillation)   . SVT (supraventricular tachycardia)     S/P ABLATION   2008  . Coronary artery disease CARDIOLOGIST- DR Crissie Sickles    09-08-10 VISIT AND PACEMAKER CHECK  IN EPIC  . Cancer     HX OF PROSTATE CANCER--TX'D WITH RADIATION  . Sleep apnea TESTED YRS AGO-- NO CPAP RX GIVEN    PT STATES HE DOES NOT THINK HE STILL HAS SLEEP APNEA--DOES NOT USE CPAP  . Stroke Oct. 14, 2014    MEDICATIONS: Current Outpatient Prescriptions on File Prior to Visit  Medication Sig Dispense Refill  . amLODipine (NORVASC) 5 MG tablet Take 1 tablet (5 mg total) by mouth daily.  90 tablet  3  . atorvastatin (LIPITOR) 20 MG tablet Take 1 tablet (20 mg total) by mouth daily. TAKE ONE TABLET BY MOUTH DAILY  90 tablet  3  . citalopram (CELEXA) 20 MG tablet TAKE 1 TABLET (20 MG TOTAL) BY MOUTH DAILY.  90 tablet  3  . clotrimazole-betamethasone (LOTRISONE) cream Apply 1 application topically 2 (two) times daily. For psoriasis.apply to affected are twice a day  90 g  3  . Cyanocobalamin (VITAMIN B-12 IJ) Inject as directed. Every two weeks      . diclofenac sodium  (VOLTAREN) 1 % GEL Apply 1 application topically 2 (two) times daily as needed. Arthritis in both thumbs.  300 g  3  . ezetimibe (ZETIA) 10 MG tablet Take 1 tablet (10 mg total) by mouth daily.  90 tablet  3  . ipratropium (ATROVENT) 0.06 % nasal spray Place 2 sprays into both nostrils 4 (four) times daily.  45 mL  3  . iron polysaccharides (NIFEREX) 150 MG capsule Take 150 mg by mouth 2 (two) times daily.      Marland Kitchen  lisinopril (PRINIVIL,ZESTRIL) 10 MG tablet Take 1 tablet (10 mg total) by mouth 2 (two) times daily.  180 tablet  3  . methotrexate (RHEUMATREX) 2.5 MG tablet TAKE 4 TABLETS ONE TIME WEEKLY  52 tablet  3  . mirabegron ER (MYRBETRIQ) 50 MG TB24 tablet Take 1 tablet (50 mg total) by mouth daily.  90 tablet  3  . pantoprazole (PROTONIX) 40 MG tablet Take 1 tablet (40 mg total) by mouth daily.  90 tablet  3  . predniSONE (DELTASONE) 5 MG tablet Take 1.5 tablets (7.5 mg total) by mouth daily.  135 tablet  3  . Rivaroxaban (XARELTO) 20 MG TABS tablet Take 1 tablet (20 mg total) by mouth daily with supper. This is the new medication for stroke prevention  90 tablet  3  . rOPINIRole (REQUIP) 2 MG tablet Take 1 tablet (2 mg total) by mouth at bedtime.  90 tablet  3  . solifenacin (VESICARE) 10 MG tablet Take 1 tablet (10 mg total) by mouth daily. Use vesicare or ditropan--not both  90 tablet  3  . SYRINGE-NEEDLE, DISP, 3 ML (BD ECLIPSE SYRINGE) 25G X 5/8" 3 ML MISC 1 each by Does not apply route every 14 (fourteen) days.  50 each  1   No current facility-administered medications on file prior to visit.    ALLERGIES: Allergies  Allergen Reactions  . Ciprofloxacin Nausea Only    REACTION: GI upset  . Other Other (See Comments)    SCOLLOPS--SUDDEN GI ATTACK    FAMILY HISTORY: Family History  Problem Relation Age of Onset  . Stroke Mother   . Early death Father     SOCIAL HISTORY: History   Social History  . Marital Status: Married    Spouse Name: N/A    Number of Children: N/A  .  Years of Education: N/A   Occupational History  . retired    Social History Main Topics  . Smoking status: Former Smoker -- 0.10 packs/day for 55 years    Types: Cigarettes    Quit date: 12/07/1998  . Smokeless tobacco: Never Used  . Alcohol Use: Yes     Comment: RARE  . Drug Use: No  . Sexual Activity: Yes    Partners: Female   Other Topics Concern  . Not on file   Social History Narrative  . No narrative on file    REVIEW OF SYSTEMS: Constitutional: No fevers, chills, or sweats, no generalized fatigue, change in appetite Eyes: No visual changes, double vision, eye pain Ear, nose and throat: No hearing loss, ear pain, nasal congestion, sore throat Cardiovascular: No chest pain, palpitations Respiratory:  No shortness of breath at rest or with exertion, wheezes GastrointestinaI: No nausea, vomiting, diarrhea, abdominal pain, fecal incontinence Genitourinary:  No dysuria, urinary retention or frequency Musculoskeletal:  No neck pain, back pain Integumentary: No rash, pruritus, skin lesions Neurological: as above Psychiatric: No depression, insomnia, anxiety Endocrine: No palpitations, fatigue, diaphoresis, mood swings, change in appetite, change in weight, increased thirst Hematologic/Lymphatic:  No anemia, purpura, petechiae. Allergic/Immunologic: no itchy/runny eyes, nasal congestion, recent allergic reactions, rashes  PHYSICAL EXAM: Filed Vitals:   12/27/13 0901  BP: 110/52  Pulse: 66  Temp: 98 F (36.7 C)  Resp: 18   General: No acute distress Head:  Normocephalic/atraumatic Neck: supple, no paraspinal tenderness, full range of motion Heart:  Regular rate and rhythm Lungs:  Clear to auscultation bilaterally Back: No paraspinal tenderness Neurological Exam: alert and oriented to person, place, and time.  Attention span and concentration intact, recalled 2 of 5 words after 5 minutes, remote memory intact, fund of knowledge intact.  He had trouble with the trail  making test, but he was able to copy a cube and draw a clock correctly.  Abstract thinking intact.  Serial 7 subtraction intact.  Speech fluent and not dysarthric, naming intact, repeating intact, following commands intact, naming fluency intact.  MOCA 26/30.  CN II-XII intact. Fundoscopic exam unremarkable without vessel changes, exudates, hemorrhages or papilledema.  Bulk and tone normal, muscle strength 5/5 throughout.  Reduced finger-thumb tapping amplitude on the left.  Reduced pinprick sensation in hands and feet.  Reduced vibration sensation in the feet.  Deep tendon reflexes 2+ throughout, toes upgoing.  Finger to nose testing intact with fine postural and kinetic tremor.  Gait normal stance and stride except reduced right arm swing, some trouble with tandem walking. Romberg negative.  IMPRESSION: Mild cognitive impairment.  Initially thought to be related to B12 deficiency, later thought to be sequela of stroke.  MOCA appears stable when compared to assessment prior to the stroke.  He has some good days and bad days in regard to memory.  This may be related to OSA and whether he had sleep or not. Cardioembolic stroke.  He has subtle findings on exam, such as reduced right arm swing (which he says may be related to shoulder pain) and possible bilateral Babinski.   Essential tremor, stable  PLAN: 1.  Will repeat CT of head to look for evidence of new infarcts since prior stroke 2.  Continue Xarelto for secondary stroke prevention 3.  Continue atorvastatin 20mg  (LDL at goal of less than 100) 4.  Optimize blood pressure control, Mediterranean diet and continue smoking cessation 5.  Repeat carotid doppler in one year to re-evaluate carotid artery disease as per vascular surgery. 6.  Continue B12 supplementation 7.  Would restrict driving to only daylight hours.  He has an upcoming business trip next month to [facility].  I do not recommend driving the distance himself. 8.  Will get neuropsych  evaluation and formal driving evaluation for review 9.  He takes ropinirole for RLS.  He takes 1 but sometimes 2 pills.  Due to potential side effects of cognitive changes, would limit to no more than 1 pill. 10.  Follow up in 6 months.  60 minutes spent with patient and wife, over 50% spent counseling and coordinating care.  Metta Clines, DO  CC:  Benay Pillow, MD

## 2013-12-27 NOTE — Patient Instructions (Addendum)
1.  Since you have some subtle exam findings, we will get another CT of the head. [facility]  12/31/13 at 10:45am  If you need to  Change appt the number to call is [phone] 2.  I need the report from the driving evaluation and Dr. Valentina Shaggy.  No driving at night.  I would not drive alone to [facility]. 3.  Limit ropinirole to one pill. 4.  Follow up in 6 months.

## 2013-12-28 ENCOUNTER — Telehealth: Payer: Self-pay | Admitting: Cardiology

## 2013-12-28 NOTE — Telephone Encounter (Signed)
Called and spoke with pt about sending manual transmission from his carelink smart app. Informed pt that the manual transmission is needed so his app can receive the upgrades. Pt verbalized understanding.

## 2013-12-31 ENCOUNTER — Encounter: Payer: Self-pay | Admitting: Internal Medicine

## 2013-12-31 ENCOUNTER — Ambulatory Visit (HOSPITAL_COMMUNITY)
Admission: RE | Admit: 2013-12-31 | Discharge: 2013-12-31 | Disposition: A | Discharge status: Home / Self Care | Payer: Medicare PPO | Source: Ambulatory Visit | Attending: Neurology | Admitting: Neurology

## 2013-12-31 DIAGNOSIS — R251 Tremor, unspecified: Secondary | ICD-10-CM

## 2013-12-31 DIAGNOSIS — I69919 Unspecified symptoms and signs involving cognitive functions following unspecified cerebrovascular disease: Secondary | ICD-10-CM | POA: Insufficient documentation

## 2013-12-31 DIAGNOSIS — G4733 Obstructive sleep apnea (adult) (pediatric): Secondary | ICD-10-CM

## 2013-12-31 DIAGNOSIS — I6340 Cerebral infarction due to embolism of unspecified cerebral artery: Secondary | ICD-10-CM

## 2013-12-31 DIAGNOSIS — R259 Unspecified abnormal involuntary movements: Secondary | ICD-10-CM | POA: Insufficient documentation

## 2014-01-01 ENCOUNTER — Telehealth: Payer: Self-pay | Admitting: *Deleted

## 2014-01-01 DIAGNOSIS — G4730 Sleep apnea, unspecified: Secondary | ICD-10-CM

## 2014-01-01 DIAGNOSIS — G4710 Hypersomnia, unspecified: Secondary | ICD-10-CM

## 2014-01-01 NOTE — Telephone Encounter (Signed)
Message copied by Claudie Revering on Tue [name] 01, 2014  8:05 AM ------      Message from: JAFFE, ADAM R      Created: Tue [name] 01, 2014  7:03 AM       CT of head looks unchanged compared to prior scan.  No strokes seen that were not present on the previous scan.      ----- Message -----         From: Rad Results In Interface         Sent: 12/31/2013  11:43 AM           To: Dudley Major, DO                   ------

## 2014-01-01 NOTE — Sleep Study (Signed)
   NAME: Christopher Baker DATE OF BIRTH:  08-Oct-1933 MEDICAL RECORD NUMBER [id number]  LOCATION: [facility]  PHYSICIAN: Kathee Delton  DATE OF STUDY: 12/20/2013  SLEEP STUDY TYPE: Nocturnal Polysomnogram               REFERRING PHYSICIAN: Ricard Dillon, MD  INDICATION FOR STUDY: Hypersomnia with sleep apnea  EPWORTH SLEEPINESS SCORE:  9 HEIGHT: 6\' 3"  (190.5 cm)  WEIGHT: 227 lb (102.967 kg)    Body mass index is 28.37 kg/(m^2).  NECK SIZE: 17 in.  MEDICATIONS: Reviewed in the sleep record  SLEEP ARCHITECTURE: The patient had a total sleep time of 300 minutes, with no slow-wave sleep and decreased quantity of REM. Sleep onset latency was normal at 6.5 minutes, and REM onset was delayed until the very end of the study. Sleep efficiency was 63% during the diagnostic portion, and 82% during the titration portion.  RESPIRATORY DATA: The patient underwent a split night study where he was found to have 153 obstructive and central events in the first 125 minutes of sleep. This gave him an AHI of 73 of events per hour during the diagnostic portion of the study. The events occurred in all body positions, and there was very loud snoring noted throughout. By protocol, the patient was fitted with a Fisher Paykel large Simplus full face mask, and found to have an optimal CPAP pressure of 16 cm of water.  OXYGEN DATA: There was oxygen desaturation as low as 75% with the patient's obstructive and central events.  CARDIAC DATA: Occasional PVC noted  MOVEMENT/PARASOMNIA: Large numbers of leg jerks were noted during the night, however there was very little sleep disruption noted.  There were no abnormal behaviors seen.  IMPRESSION/ RECOMMENDATION:    1) split-night study reveals severe complex sleep apnea, with an AHI of 73 events per hour and oxygen desaturation as low as 75% during the diagnostic portion of the study. The patient was then fitted with a Fisher Paykel large Simplus  full face mask, and found to have an optimal pressure of 16 cm of water. The patient should also be encouraged to work on weight loss.  2) occasional PVC noted.     [place], American Board of Sleep Medicine  ELECTRONICALLY SIGNED ON:  01/01/2014, 9:50 AM [place] PH: [phone]   FX: [phone] [place]

## 2014-01-01 NOTE — Telephone Encounter (Signed)
Patient is aware of CT scan

## 2014-01-04 ENCOUNTER — Encounter: Payer: Self-pay | Admitting: Neurology

## 2014-01-04 ENCOUNTER — Encounter: Payer: Self-pay | Admitting: Cardiology

## 2014-01-08 ENCOUNTER — Telehealth: Payer: Self-pay | Admitting: Pulmonary Disease

## 2014-01-08 NOTE — Telephone Encounter (Signed)
Called spoke with pt. Made him aware [facility] has nothing available sooner. He did not want to r/s with another sleep doc. He will keep what he has scheduled. Nothing further needed

## 2014-01-08 NOTE — Telephone Encounter (Signed)
Appointment Request From: [name]      With Provider: Kathee Delton, MD [facility]]      Preferred Date Range: From 01/06/2014 To 01/21/2014      Preferred Times: Any      Reason for visit: Office Visit      Comments:   Back in May, Dr. Arnoldo Morale requested a Sleep Apnea test for me. The request was lost and I didn't get in for appt. until July 2. My sleep apnea is affecting my driving, my work, everything. I cannot wait until September 8 to get my equipment. I need it....now! Please can you find an earlier appt. I was told I would get in within about two weeks. This has been dragging on way too long. Thank you!

## 2014-01-10 ENCOUNTER — Ambulatory Visit: Payer: Commercial Managed Care - HMO | Admitting: Physical Medicine & Rehabilitation

## 2014-01-11 ENCOUNTER — Encounter: Payer: Self-pay | Admitting: Physical Medicine & Rehabilitation

## 2014-01-11 ENCOUNTER — Encounter: Payer: Medicare HMO | Attending: Physical Medicine & Rehabilitation

## 2014-01-11 ENCOUNTER — Ambulatory Visit (HOSPITAL_BASED_OUTPATIENT_CLINIC_OR_DEPARTMENT_OTHER): Payer: Commercial Managed Care - HMO | Admitting: Physical Medicine & Rehabilitation

## 2014-01-11 VITALS — BP 125/61 | HR 83 | Resp 14 | Ht 75.0 in | Wt 228.0 lb

## 2014-01-11 DIAGNOSIS — M1990 Unspecified osteoarthritis, unspecified site: Secondary | ICD-10-CM | POA: Diagnosis not present

## 2014-01-11 DIAGNOSIS — H5333 Simultaneous visual perception without fusion: Secondary | ICD-10-CM | POA: Insufficient documentation

## 2014-01-11 DIAGNOSIS — I69993 Ataxia following unspecified cerebrovascular disease: Secondary | ICD-10-CM | POA: Insufficient documentation

## 2014-01-11 DIAGNOSIS — M25519 Pain in unspecified shoulder: Secondary | ICD-10-CM | POA: Insufficient documentation

## 2014-01-11 DIAGNOSIS — I69919 Unspecified symptoms and signs involving cognitive functions following unspecified cerebrovascular disease: Secondary | ICD-10-CM | POA: Diagnosis not present

## 2014-01-11 DIAGNOSIS — I6990 Unspecified sequelae of unspecified cerebrovascular disease: Secondary | ICD-10-CM | POA: Diagnosis present

## 2014-01-11 DIAGNOSIS — H539 Unspecified visual disturbance: Secondary | ICD-10-CM | POA: Insufficient documentation

## 2014-01-11 DIAGNOSIS — R29898 Other symptoms and signs involving the musculoskeletal system: Secondary | ICD-10-CM | POA: Insufficient documentation

## 2014-01-11 DIAGNOSIS — I69998 Other sequelae following unspecified cerebrovascular disease: Secondary | ICD-10-CM | POA: Insufficient documentation

## 2014-01-11 NOTE — Progress Notes (Signed)
Subjective:    Patient ID: Christopher Baker, male    DOB: [birth year], 80 y.o.   MRN: [id number]  Admitted 04/03/2013 with left-sided weakness and facial droop. Cranial CT scan showed right cortical and subcortical infarcts(right caudate head, right parietal lobe). Infarct felt to be embolic secondary to history of PAF. 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 General activity 4 Relation with others 6 Enjoyment of life 4 What TIME of day is your pain at its worst? morning Sleep (in general) Fair  Pain is worse with: some activites Pain improves with: pacing activities Relief from Meds: 7  Mobility walk without assistance ability to climb steps?  yes do you drive?  yes transfers alone Do you have any goals in this area?  yes  Function employed # of hrs/week 10 disabled: date disabled 03/2013 I need assistance with the following:  dressing  Neuro/Psych tremor confusion  Prior Studies Any changes since last visit?  no  Physicians involved in your care Any changes since last visit?  no   Family History  Problem Relation Age of Onset  . Stroke Mother   . Early death Father    History   Social History  . Marital Status: Married    Spouse Name: N/A    Number of Children: N/A  . Years of Education: N/A   Occupational History  . retired    Social History Main Topics  . Smoking status: Former Smoker -- 0.10 packs/day for 55 years    Types: Cigarettes    Quit date: 12/07/1998  . Smokeless tobacco: Never Used  .  Alcohol Use: Yes     Comment: RARE  . Drug Use: No  . Sexual Activity: Yes    Partners: Female   Other Topics Concern  . None   Social History Narrative  . None   Past Surgical History  Procedure Laterality Date  . Replacement total knee  1997    RIGHT  . Cardiac pacemaker placement  06-04-10    DDD/ AND REMOVAL LOOR RECORDER  . Loop recorder placement  01-30-10  . Lumbar laminectomy/ diskectomy/ fusion  05-19-10    L4 - 5  . Cholecystectomy  2009  . Cardiac electrophysiology study and ablation  2008    FOR SVT  . Prostate biopsy  2007  . Transurethral resection of prostate  2006  . Inguinal hernia repair  2005    LEFT/   DONE WITH PENILE PROSTESIS SURG.  . Removal and placement penile prosthesis implant   2005    AND LEFT CORPOROPLASTY /    DONE INGUINAL REPAIR  . Penile prosthesis implant  1995  . Knee arthroscopy  06/02/2011    Procedure: ARTHROSCOPY KNEE;  Surgeon: Gearlean Alf;  Location: [facility];  Service: Orthopedics;  Laterality: Left;  LEFT KNEE ARTHROSCOPY WITH DEBRIDEMENT  . Total knee arthroplasty  12/20/2011    Procedure: TOTAL KNEE ARTHROPLASTY;  Surgeon: Gearlean Alf, MD;  Location: [facility];  Service:  Orthopedics;  Laterality: Left;  . Back surgery    . Joint replacement    . Spine surgery     Past Medical History  Diagnosis Date  . Hyperlipidemia   . HTN (hypertension)   . Status post placement of cardiac pacemaker DDD-  06-04-10-- LAST CHECK 09-08-10 IN EPIC    SECONDARY TO SYNCOPY AND BRADYCARDIA  . Restless leg syndrome   . Normal nuclear stress test 2008    LOW RISK--   DR  WALL  . Carotid stenosis, bilateral PER DR EARLY / DUPLEX  09-09-10      40 - 59%   BILATERALLY--- ASYMPTOMATIC  . History of echocardiogram 11-21-2006    EF 65%  . History of prostate cancer S/P RADIATION  TX  6 YRS AGO  . Psoriasis   . Arthritis with psoriasis   . GERD (gastroesophageal reflux disease)     CONTROLLED W/ PROTONIX  . Borderline  diabetes     DIET CONTROLLED - PT STATES HE IS NOT DIABETIC  . Lumbar spondylosis W/ RADICULOPATHY  . Iron deficiency   . Cataract immature LEFT EYE  . PAF (paroxysmal atrial fibrillation)   . SVT (supraventricular tachycardia)     S/P ABLATION   2008  . Coronary artery disease CARDIOLOGIST- DR Crissie Sickles    09-08-10 VISIT AND PACEMAKER CHECK  IN EPIC  . Cancer     HX OF PROSTATE CANCER--TX'D WITH RADIATION  . Sleep apnea TESTED YRS AGO-- NO CPAP RX GIVEN    PT STATES HE DOES NOT THINK HE STILL HAS SLEEP APNEA--DOES NOT USE CPAP  . Stroke Oct. 14, 2014   BP 125/61  Pulse 83  Resp 14  Ht 6\' 3"  (1.905 m)  Wt 228 lb (103.42 kg)  BMI 28.50 kg/m2  SpO2 97%  Opioid Risk Score:   Fall Risk Score: Moderate Fall Risk (6-13 points) (patient educated handout declined)   Review of Systems  Constitutional: Positive for unexpected weight change.  Respiratory: Positive for apnea.   Neurological: Positive for tremors.  Psychiatric/Behavioral: Positive for confusion.  All other systems reviewed and are negative.      Objective:   Physical Exam  Nursing note and vitals reviewed. Constitutional: He is oriented to person, place, and time. He appears well-developed and well-nourished.  HENT:  Head: Normocephalic and atraumatic.  Eyes: Conjunctivae and EOM are normal. Pupils are equal, round, and reactive to light.  Neurological: He is alert and oriented to person, place, and time.  Neurological: He is alert and oriented to person, place, and time. He displays no atrophy. No sensory deficit. He exhibits normal muscle tone. Coordination and gait abnormal.  Motor strength is 4+/5 in the left deltoid, bicep, tricep, grip 5/5 in bilateral hip flexors knee extensors ankle dorsiflexors and plantar flexor 5/5 in the right deltoid, bicep and tricep and grip Ambulates without device unable to do tandem gait   Psychiatric: He has a normal mood and affect.          Assessment & Plan:    Right MCA infarct with cognitive deficits, STM problems , visual perceptual deficits have improved, residual left upper extremity weakness as well as decreased balance.  Recommend continued home exercise program  Behind the wheel driver's evaluation report reviewed. Patient is safe for driving in familiar environments, daytime driving, no long distance driving.  We discussed the patient's residual deficits which are mainly with short-term memory. We discussed that fatigue will worsen his symptoms. We also discussed that  he may not get back to his pre-stroke baseline.  Return to clinic 8wks  EMG of both upper extremities if numbness tingling symptoms progress  2.. Fatigue hx of moderate OSA , hopefully patient will receive CPAP soon. He will followup with me in October. His fatigue may improve once he is regularly using CPAP.

## 2014-01-11 NOTE — Patient Instructions (Signed)
Short term memory may be a long term issue

## 2014-01-14 ENCOUNTER — Ambulatory Visit (INDEPENDENT_AMBULATORY_CARE_PROVIDER_SITE_OTHER): Payer: Commercial Managed Care - HMO | Admitting: Family

## 2014-01-14 ENCOUNTER — Telehealth: Payer: Self-pay | Admitting: Pulmonary Disease

## 2014-01-14 ENCOUNTER — Encounter: Payer: Self-pay | Admitting: Family

## 2014-01-14 VITALS — BP 120/70 | HR 89 | Temp 98.5°F | Ht 75.0 in | Wt 231.0 lb

## 2014-01-14 DIAGNOSIS — Z8673 Personal history of transient ischemic attack (TIA), and cerebral infarction without residual deficits: Secondary | ICD-10-CM

## 2014-01-14 DIAGNOSIS — E7800 Pure hypercholesterolemia, unspecified: Secondary | ICD-10-CM

## 2014-01-14 DIAGNOSIS — I10 Essential (primary) hypertension: Secondary | ICD-10-CM

## 2014-01-14 MED ORDER — ROPINIROLE HCL 2 MG PO TABS: 2.0000 mg | ORAL_TABLET | Freq: Every day | ORAL | Status: DC

## 2014-01-14 MED ORDER — "SYRINGE/NEEDLE (DISP) 25G X 5/8"" 3 ML MISC": 1.0000 | Status: DC

## 2014-01-14 MED ORDER — AMLODIPINE BESYLATE 5 MG PO TABS: 2.5000 mg | ORAL_TABLET | Freq: Every day | ORAL | Status: DC

## 2014-01-14 NOTE — Patient Instructions (Signed)
Peripheral Edema °You have swelling in your legs (peripheral edema). This swelling is due to excess accumulation of salt and water in your body. Edema may be a sign of heart, kidney or liver disease, or a side effect of a medication. It may also be due to problems in the leg veins. Elevating your legs and using special support stockings may be very helpful, if the cause of the swelling is due to poor venous circulation. Avoid long periods of standing, whatever the cause. °Treatment of edema depends on identifying the cause. Chips, pretzels, pickles and other salty foods should be avoided. Restricting salt in your diet is almost always needed. Water pills (diuretics) are often used to remove the excess salt and water from your body via urine. These medicines prevent the kidney from reabsorbing sodium. This increases urine flow. °Diuretic treatment may also result in lowering of potassium levels in your body. Potassium supplements may be needed if you have to use diuretics daily. Daily weights can help you keep track of your progress in clearing your edema. You should call your caregiver for follow up care as recommended. °SEEK IMMEDIATE MEDICAL CARE IF:  °· You have increased swelling, pain, redness, or heat in your legs. °· You develop shortness of breath, especially when lying down. °· You develop chest or abdominal pain, weakness, or fainting. °· You have a fever. °Document Released: 07/15/2004 Document Revised: 08/30/2011 Document Reviewed: 06/25/2009 °Exit[employer]® Patient Information ©2015 [employer]. This information is not intended to replace advice given to you by your health care provider. Make sure you discuss any questions you have with your health care provider. ° °

## 2014-01-14 NOTE — Telephone Encounter (Signed)
LM for Deb to return call.  First available sooner appt with Erhard is 02/19/2014

## 2014-01-14 NOTE — Progress Notes (Signed)
Subjective:    Patient ID: Christopher Baker, male    DOB: 1933/12/07, 80 y.o.   MRN: [id number]  HPI  80 year old white male, nonsmoker with a history of CVA, atrial fibrillation, hyperlipidemia, hypertension and is in today for recheck. Had a CVA last year and has recovered quite well. Underwent rehabilitation and is back attempting to play golf. Has concerns of dizziness when he bends forward. Is currently taking Lisinopril and Norvasc. Has peripheral edema.   Review of Systems  Constitutional: Negative.   HENT: Negative.   Respiratory: Negative.   Cardiovascular: Negative.   Gastrointestinal: Negative.   Endocrine: Negative.   Genitourinary: Negative.   Musculoskeletal: Negative.   Skin: Negative.   Allergic/Immunologic: Negative.   Neurological: Positive for dizziness and light-headedness.  Hematological: Negative.   Psychiatric/Behavioral: Negative.    Past Medical History  Diagnosis Date  . Hyperlipidemia   . HTN (hypertension)   . Status post placement of cardiac pacemaker DDD-  06-04-10-- LAST CHECK 09-08-10 IN EPIC    SECONDARY TO SYNCOPY AND BRADYCARDIA  . Restless leg syndrome   . Normal nuclear stress test 2008    LOW RISK--   DR  WALL  . Carotid stenosis, bilateral PER DR EARLY / DUPLEX  09-09-10      40 - 59%   BILATERALLY--- ASYMPTOMATIC  . History of echocardiogram 11-21-2006    EF 65%  . History of prostate cancer S/P RADIATION  TX  6 YRS AGO  . Psoriasis   . Arthritis with psoriasis   . GERD (gastroesophageal reflux disease)     CONTROLLED W/ PROTONIX  . Borderline diabetes     DIET CONTROLLED - PT STATES HE IS NOT DIABETIC  . Lumbar spondylosis W/ RADICULOPATHY  . Iron deficiency   . Cataract immature LEFT EYE  . PAF (paroxysmal atrial fibrillation)   . SVT (supraventricular tachycardia)     S/P ABLATION   2008  . Coronary artery disease CARDIOLOGIST- DR Crissie Sickles    09-08-10 VISIT AND PACEMAKER CHECK  IN EPIC  . Cancer     HX OF PROSTATE  CANCER--TX'D WITH RADIATION  . Sleep apnea TESTED YRS AGO-- NO CPAP RX GIVEN    PT STATES HE DOES NOT THINK HE STILL HAS SLEEP APNEA--DOES NOT USE CPAP  . Stroke Oct. 14, 2014    History   Social History  . Marital Status: Married    Spouse Name: N/A    Number of Children: N/A  . Years of Education: N/A   Occupational History  . retired    Social History Main Topics  . Smoking status: Former Smoker -- 0.10 packs/day for 55 years    Types: Cigarettes    Quit date: 12/07/1998  . Smokeless tobacco: Never Used  . Alcohol Use: Yes     Comment: RARE  . Drug Use: No  . Sexual Activity: Yes    Partners: Female   Other Topics Concern  . Not on file   Social History Narrative  . No narrative on file    Past Surgical History  Procedure Laterality Date  . Replacement total knee  1997    RIGHT  . Cardiac pacemaker placement  06-04-10    DDD/ AND REMOVAL LOOR RECORDER  . Loop recorder placement  01-30-10  . Lumbar laminectomy/ diskectomy/ fusion  05-19-10    L4 - 5  . Cholecystectomy  2009  . Cardiac electrophysiology study and ablation  2008    FOR SVT  . Prostate  biopsy  2007  . Transurethral resection of prostate  2006  . Inguinal hernia repair  2005    LEFT/   DONE WITH PENILE PROSTESIS SURG.  . Removal and placement penile prosthesis implant   2005    AND LEFT CORPOROPLASTY /    DONE INGUINAL REPAIR  . Penile prosthesis implant  1995  . Knee arthroscopy  06/02/2011    Procedure: ARTHROSCOPY KNEE;  Surgeon: Gearlean Alf;  Location: [facility];  Service: Orthopedics;  Laterality: Left;  LEFT KNEE ARTHROSCOPY WITH DEBRIDEMENT  . Total knee arthroplasty  12/20/2011    Procedure: TOTAL KNEE ARTHROPLASTY;  Surgeon: Gearlean Alf, MD;  Location: [facility];  Service: Orthopedics;  Laterality: Left;  . Back surgery    . Joint replacement    . Spine surgery      Family History  Problem Relation Age of Onset  . Stroke Mother   . Early death Father      Allergies  Allergen Reactions  . Ciprofloxacin Nausea Only    REACTION: GI upset  . Other Other (See Comments)    SCOLLOPS--SUDDEN GI ATTACK    Current Outpatient Prescriptions on File Prior to Visit  Medication Sig Dispense Refill  . atorvastatin (LIPITOR) 20 MG tablet Take 1 tablet (20 mg total) by mouth daily. TAKE ONE TABLET BY MOUTH DAILY  90 tablet  3  . citalopram (CELEXA) 20 MG tablet TAKE 1 TABLET (20 MG TOTAL) BY MOUTH DAILY.  90 tablet  3  . clotrimazole-betamethasone (LOTRISONE) cream Apply 1 application topically 2 (two) times daily. For psoriasis.apply to affected are twice a day  90 g  3  . Cyanocobalamin (VITAMIN B-12 IJ) Inject as directed. Every two weeks      . diclofenac sodium (VOLTAREN) 1 % GEL Apply 1 application topically 2 (two) times daily as needed. Arthritis in both thumbs.  300 g  3  . ezetimibe (ZETIA) 10 MG tablet Take 1 tablet (10 mg total) by mouth daily.  90 tablet  3  . ipratropium (ATROVENT) 0.06 % nasal spray Place 2 sprays into both nostrils 4 (four) times daily.  45 mL  3  . iron polysaccharides (NIFEREX) 150 MG capsule Take 150 mg by mouth 2 (two) times daily.      Marland Kitchen lisinopril (PRINIVIL,ZESTRIL) 10 MG tablet Take 1 tablet (10 mg total) by mouth 2 (two) times daily.  180 tablet  3  . methotrexate (RHEUMATREX) 2.5 MG tablet TAKE 4 TABLETS ONE TIME WEEKLY  52 tablet  3  . mirabegron ER (MYRBETRIQ) 50 MG TB24 tablet Take 1 tablet (50 mg total) by mouth daily.  90 tablet  3  . pantoprazole (PROTONIX) 40 MG tablet Take 1 tablet (40 mg total) by mouth daily.  90 tablet  3  . predniSONE (DELTASONE) 5 MG tablet Take 1.5 tablets (7.5 mg total) by mouth daily.  135 tablet  3  . Rivaroxaban (XARELTO) 20 MG TABS tablet Take 1 tablet (20 mg total) by mouth daily with supper. This is the new medication for stroke prevention  90 tablet  3  . solifenacin (VESICARE) 10 MG tablet Take 1 tablet (10 mg total) by mouth daily. Use vesicare or ditropan--not both  90  tablet  3  . ZETIA 10 MG tablet TAKE ONE TABLET BY MOUTH DAILY  30 tablet  5   No current facility-administered medications on file prior to visit.    BP 120/70  Pulse 89  Temp(Src) 98.5 F (  36.9 C) (Oral)  Ht 6\' 3"  (1.905 m)  Wt 231 lb (104.781 kg)  BMI 28.87 kg/m2  SpO2 96%chart    Objective:   Physical Exam  Constitutional: He is oriented to person, place, and time. He appears well-developed and well-nourished.  HENT:  Right Ear: External ear normal.  Left Ear: External ear normal.  Nose: Nose normal.  Mouth/Throat: Oropharynx is clear and moist.  Neck: Normal range of motion. Neck supple.  Cardiovascular: Normal rate, regular rhythm and normal heart sounds.   Pulmonary/Chest: Effort normal and breath sounds normal.  Abdominal: Soft. Bowel sounds are normal.  Musculoskeletal: Normal range of motion.  Neurological: He is alert and oriented to person, place, and time.  Skin: Skin is warm and dry.  Psychiatric: He has a normal mood and affect.          Assessment & Plan:  Christopher Baker was seen today for follow-up.  Diagnoses and associated orders for this visit:  Unspecified essential hypertension - Basic Metabolic Panel - Hepatic Function Panel  Pure hypercholesterolemia - Lipid Panel  History of CVA (cerebrovascular accident) - Lipid Panel  Other Orders - rOPINIRole (REQUIP) 2 MG tablet; Take 1 tablet (2 mg total) by mouth at bedtime. - SYRINGE-NEEDLE, DISP, 3 ML (BD ECLIPSE SYRINGE) 25G X 5/8" 3 ML MISC; 1 each by Does not apply route every 14 (fourteen) days. - amLODipine (NORVASC) 5 MG tablet; Take 0.5 tablets (2.5 mg total) by mouth daily.   Call the office with any questions or concerns. Continue current meds except decrease amlodipine to 2.5 mg once daily. Recheck in 3 weeks

## 2014-01-14 NOTE — Progress Notes (Signed)
Pre visit review using our clinic review tool, if applicable. No additional management support is needed unless otherwise documented below in the visit note. 

## 2014-01-15 ENCOUNTER — Telehealth: Payer: Self-pay | Admitting: Internal Medicine

## 2014-01-15 NOTE — Telephone Encounter (Signed)
Error/gd °

## 2014-01-19 ENCOUNTER — Encounter: Payer: Self-pay | Admitting: Family

## 2014-02-05 ENCOUNTER — Ambulatory Visit: Payer: Commercial Managed Care - HMO | Admitting: Family

## 2014-02-06 ENCOUNTER — Encounter: Payer: Self-pay | Admitting: Family

## 2014-02-06 ENCOUNTER — Ambulatory Visit: Payer: Commercial Managed Care - HMO | Admitting: Family

## 2014-02-18 ENCOUNTER — Encounter: Payer: Self-pay | Admitting: Family

## 2014-02-19 ENCOUNTER — Ambulatory Visit: Payer: Commercial Managed Care - HMO | Admitting: Family

## 2014-02-20 ENCOUNTER — Ambulatory Visit (INDEPENDENT_AMBULATORY_CARE_PROVIDER_SITE_OTHER): Payer: Commercial Managed Care - HMO | Admitting: Family

## 2014-02-20 ENCOUNTER — Encounter: Payer: Self-pay | Admitting: Family

## 2014-02-20 VITALS — BP 130/64 | HR 86 | Ht 75.0 in | Wt 226.1 lb

## 2014-02-20 DIAGNOSIS — I951 Orthostatic hypotension: Secondary | ICD-10-CM

## 2014-02-20 DIAGNOSIS — I10 Essential (primary) hypertension: Secondary | ICD-10-CM

## 2014-02-20 NOTE — Patient Instructions (Addendum)
1. Stop Amlodipine.   Low-Sodium Eating Plan Sodium raises blood pressure and causes water to be held in the body. Getting less sodium from food will help lower your blood pressure, reduce any swelling, and protect your heart, liver, and kidneys. We get sodium by adding salt (sodium chloride) to food. Most of our sodium comes from canned, boxed, and frozen foods. Restaurant foods, fast foods, and pizza are also very high in sodium. Even if you take medicine to lower your blood pressure or to reduce fluid in your body, getting less sodium from your food is important. WHAT IS MY PLAN? Most people should limit their sodium intake to 2,300 mg a day. Your health care provider recommends that you limit your sodium intake to __________ a day.  WHAT DO I NEED TO KNOW ABOUT THIS EATING PLAN? For the low-sodium eating plan, you will follow these general guidelines:  Choose foods with a % Daily Value for sodium of less than 5% (as listed on the food label).   Use salt-free seasonings or herbs instead of table salt or sea salt.   Check with your health care provider or pharmacist before using salt substitutes.   Eat fresh foods.  Eat more vegetables and fruits.  Limit canned vegetables. If you do use them, rinse them well to decrease the sodium.   Limit cheese to 1 oz (28 g) per day.   Eat lower-sodium products, often labeled as "lower sodium" or "no salt added."  Avoid foods that contain monosodium glutamate (MSG). MSG is sometimes added to [place] food and some canned foods.  Check food labels (Nutrition Facts labels) on foods to learn how much sodium is in one serving.  Eat more home-cooked food and less restaurant, buffet, and fast food.  When eating at a restaurant, ask that your food be prepared with less salt or none, if possible.  HOW DO I READ FOOD LABELS FOR SODIUM INFORMATION? The Nutrition Facts label lists the amount of sodium in one serving of the food. If you eat more  than one serving, you must multiply the listed amount of sodium by the number of servings. Food labels may also identify foods as:  Sodium free--Less than 5 mg in a serving.  Very low sodium--35 mg or less in a serving.  Low sodium--140 mg or less in a serving.  Light in sodium--50% less sodium in a serving. For example, if a food that usually has 300 mg of sodium is changed to become light in sodium, it will have 150 mg of sodium.  Reduced sodium--25% less sodium in a serving. For example, if a food that usually has 400 mg of sodium is changed to reduced sodium, it will have 300 mg of sodium. WHAT FOODS CAN I EAT? Grains Low-sodium cereals, including oats, puffed wheat and rice, and shredded wheat cereals. Low-sodium crackers. Unsalted rice and pasta. Lower-sodium bread.  Vegetables Frozen or fresh vegetables. Low-sodium or reduced-sodium canned vegetables. Low-sodium or reduced-sodium tomato sauce and paste. Low-sodium or reduced-sodium tomato and vegetable juices.  Fruits Fresh, frozen, and canned fruit. Fruit juice.  Meat and Other Protein Products Low-sodium canned tuna and salmon. Fresh or frozen meat, poultry, seafood, and fish. Lamb. Unsalted nuts. Dried beans, peas, and lentils without added salt. Unsalted canned beans. Homemade soups without salt. Eggs.  Dairy Milk. Soy milk. Ricotta cheese. Low-sodium or reduced-sodium cheeses. Yogurt.  Condiments Fresh and dried herbs and spices. Salt-free seasonings. Onion and garlic powders. Low-sodium varieties of mustard and ketchup. [place]  juice.  Fats and Oils Reduced-sodium salad dressings. Unsalted butter.  Other Unsalted popcorn and pretzels.  The items listed above may not be a complete list of recommended foods or beverages. Contact your dietitian for more options. WHAT FOODS ARE NOT RECOMMENDED? Grains Instant hot cereals. Bread stuffing, pancake, and biscuit mixes. Croutons. Seasoned rice or pasta mixes. Noodle soup  cups. Boxed or frozen macaroni and cheese. Self-rising flour. Regular salted crackers. Vegetables Regular canned vegetables. Regular canned tomato sauce and paste. Regular tomato and vegetable juices. Frozen vegetables in sauces. Salted french fries. Olives. Angie Fava. Relishes. Sauerkraut. Salsa. Meat and Other Protein Products Salted, canned, smoked, spiced, or pickled meats, seafood, or fish. Bacon, ham, sausage, hot dogs, corned beef, chipped beef, and packaged luncheon meats. Salt pork. Jerky. Pickled herring. Anchovies, regular canned tuna, and sardines. Salted nuts. Dairy Processed cheese and cheese spreads. Cheese curds. Blue cheese and cottage cheese. Buttermilk.  Condiments Onion and garlic salt, seasoned salt, table salt, and sea salt. Canned and packaged gravies. Worcestershire sauce. Tartar sauce. Barbecue sauce. Teriyaki sauce. Soy sauce, including reduced sodium. Steak sauce. Fish sauce. Oyster sauce. Cocktail sauce. Horseradish. Regular ketchup and mustard. Meat flavorings and tenderizers. Bouillon cubes. Hot sauce. Tabasco sauce. Marinades. Taco seasonings. Relishes. Fats and Oils Regular salad dressings. Salted butter. Margarine. Ghee. Bacon fat.  Other Potato and tortilla chips. Corn chips and puffs. Salted popcorn and pretzels. Canned or dried soups. Pizza. Frozen entrees and pot pies.  The items listed above may not be a complete list of foods and beverages to avoid. Contact your dietitian for more information. Document Released: 11/27/2001 Document Revised: 06/12/2013 Document Reviewed: 04/11/2013 [facility] Patient Information 2015 [place], [place]. This information is not intended to replace advice given to you by your health care provider. Make sure you discuss any questions you have with your health care provider.

## 2014-02-20 NOTE — Progress Notes (Signed)
Subjective:    Patient ID: Christopher Baker, male    DOB: [birth year], 80 y.o.   MRN: [id number]  HPI 80 year old white male, nonsmoker is in today for recheck of dizziness related to hypotension. He is currently taking amlodipine 2.5 mg once daily and lisinopril 10 mg. Patient had concerns that when he bent forward to play golf he would get lightheaded and dizzy. Therefore, we decreased amlodipine 2.5 mg. Continues to have dizziness when going from a sitting to standing position or leaning forward but has improved some.    Review of Systems  Constitutional: Negative.   HENT: Negative.   Respiratory: Negative.   Cardiovascular: Negative.   Gastrointestinal: Negative.   Endocrine: Negative.   Genitourinary: Negative.   Musculoskeletal: Negative.   Skin: Negative.   Allergic/Immunologic: Negative.   Neurological: Positive for dizziness.       When bending forward  Hematological: Negative.   Psychiatric/Behavioral: Negative.    Past Medical History  Diagnosis Date  . Hyperlipidemia   . HTN (hypertension)   . Status post placement of cardiac pacemaker DDD-  06-04-10-- LAST CHECK 09-08-10 IN EPIC    SECONDARY TO SYNCOPY AND BRADYCARDIA  . Restless leg syndrome   . Normal nuclear stress test 2008    LOW RISK--   DR  WALL  . Carotid stenosis, bilateral PER DR EARLY / DUPLEX  09-09-10      40 - 59%   BILATERALLY--- ASYMPTOMATIC  . History of echocardiogram 11-21-2006    EF 65%  . History of prostate cancer S/P RADIATION  TX  6 YRS AGO  . Psoriasis   . Arthritis with psoriasis   . GERD (gastroesophageal reflux disease)     CONTROLLED W/ PROTONIX  . Borderline diabetes     DIET CONTROLLED - PT STATES HE IS NOT DIABETIC  . Lumbar spondylosis W/ RADICULOPATHY  . Iron deficiency   . Cataract immature LEFT EYE  . PAF (paroxysmal atrial fibrillation)   . SVT (supraventricular tachycardia)     S/P ABLATION   2008  . Coronary artery disease CARDIOLOGIST- DR Crissie Sickles    09-08-10  VISIT AND PACEMAKER CHECK  IN EPIC  . Cancer     HX OF PROSTATE CANCER--TX'D WITH RADIATION  . Sleep apnea TESTED YRS AGO-- NO CPAP RX GIVEN    PT STATES HE DOES NOT THINK HE STILL HAS SLEEP APNEA--DOES NOT USE CPAP  . Stroke Oct. 14, 2014    History   Social History  . Marital Status: Married    Spouse Name: N/A    Number of Children: N/A  . Years of Education: N/A   Occupational History  . retired    Social History Main Topics  . Smoking status: Former Smoker -- 0.10 packs/day for 55 years    Types: Cigarettes    Quit date: 12/07/1998  . Smokeless tobacco: Never Used  . Alcohol Use: Yes     Comment: RARE  . Drug Use: No  . Sexual Activity: Yes    Partners: Female   Other Topics Concern  . Not on file   Social History Narrative  . No narrative on file    Past Surgical History  Procedure Laterality Date  . Replacement total knee  1997    RIGHT  . Cardiac pacemaker placement  06-04-10    DDD/ AND REMOVAL LOOR RECORDER  . Loop recorder placement  01-30-10  . Lumbar laminectomy/ diskectomy/ fusion  05-19-10    L4 - 5  .  Cholecystectomy  2009  . Cardiac electrophysiology study and ablation  2008    FOR SVT  . Prostate biopsy  2007  . Transurethral resection of prostate  2006  . Inguinal hernia repair  2005    LEFT/   DONE WITH PENILE PROSTESIS SURG.  . Removal and placement penile prosthesis implant   2005    AND LEFT CORPOROPLASTY /    DONE INGUINAL REPAIR  . Penile prosthesis implant  1995  . Knee arthroscopy  06/02/2011    Procedure: ARTHROSCOPY KNEE;  Surgeon: Gearlean Alf;  Location: [place];  Service: Orthopedics;  Laterality: Left;  LEFT KNEE ARTHROSCOPY WITH DEBRIDEMENT  . Total knee arthroplasty  12/20/2011    Procedure: TOTAL KNEE ARTHROPLASTY;  Surgeon: Gearlean Alf, MD;  Location: [facility];  Service: Orthopedics;  Laterality: Left;  . Back surgery    . Joint replacement    . Spine surgery      Family History  Problem  Relation Age of Onset  . Stroke Mother   . Early death Father     Allergies  Allergen Reactions  . Ciprofloxacin Nausea Only    REACTION: GI upset  . Other Other (See Comments)    SCOLLOPS--SUDDEN GI ATTACK    Current Outpatient Prescriptions on File Prior to Visit  Medication Sig Dispense Refill  . atorvastatin (LIPITOR) 20 MG tablet Take 1 tablet (20 mg total) by mouth daily. TAKE ONE TABLET BY MOUTH DAILY  90 tablet  3  . citalopram (CELEXA) 20 MG tablet TAKE 1 TABLET (20 MG TOTAL) BY MOUTH DAILY.  90 tablet  3  . clotrimazole-betamethasone (LOTRISONE) cream Apply 1 application topically 2 (two) times daily. For psoriasis.apply to affected are twice a day  90 g  3  . Cyanocobalamin (VITAMIN B-12 IJ) Inject as directed. Every two weeks      . diclofenac sodium (VOLTAREN) 1 % GEL Apply 1 application topically 2 (two) times daily as needed. Arthritis in both thumbs.  300 g  3  . ezetimibe (ZETIA) 10 MG tablet Take 1 tablet (10 mg total) by mouth daily.  90 tablet  3  . ipratropium (ATROVENT) 0.06 % nasal spray Place 2 sprays into both nostrils 4 (four) times daily.  45 mL  3  . iron polysaccharides (NIFEREX) 150 MG capsule Take 150 mg by mouth 2 (two) times daily.      Marland Kitchen lisinopril (PRINIVIL,ZESTRIL) 10 MG tablet Take 1 tablet (10 mg total) by mouth 2 (two) times daily.  180 tablet  3  . methotrexate (RHEUMATREX) 2.5 MG tablet TAKE 4 TABLETS ONE TIME WEEKLY  52 tablet  3  . mirabegron ER (MYRBETRIQ) 50 MG TB24 tablet Take 1 tablet (50 mg total) by mouth daily.  90 tablet  3  . pantoprazole (PROTONIX) 40 MG tablet Take 1 tablet (40 mg total) by mouth daily.  90 tablet  3  . predniSONE (DELTASONE) 5 MG tablet Take 1.5 tablets (7.5 mg total) by mouth daily.  135 tablet  3  . Rivaroxaban (XARELTO) 20 MG TABS tablet Take 1 tablet (20 mg total) by mouth daily with supper. This is the new medication for stroke prevention  90 tablet  3  . rOPINIRole (REQUIP) 2 MG tablet Take 1 tablet (2 mg  total) by mouth at bedtime.  90 tablet  3  . solifenacin (VESICARE) 10 MG tablet Take 1 tablet (10 mg total) by mouth daily. Use vesicare or ditropan--not both  90 tablet  3  . SYRINGE-NEEDLE, DISP, 3 ML (BD ECLIPSE SYRINGE) 25G X 5/8" 3 ML MISC 1 each by Does not apply route every 14 (fourteen) days.  100 each  1  . ZETIA 10 MG tablet TAKE ONE TABLET BY MOUTH DAILY  30 tablet  5   No current facility-administered medications on file prior to visit.    BP 130/64  Pulse 86  Ht 6\' 3"  (1.905 m)  Wt 226 lb 1.6 oz (102.558 kg)  BMI 28.26 kg/m2chart    Objective:   Physical Exam  Constitutional: He is oriented to person, place, and time. He appears well-developed and well-nourished.  HENT:  Right Ear: External ear normal.  Left Ear: External ear normal.  Mouth/Throat: Oropharynx is clear and moist.  Neck: Normal range of motion. Neck supple.  Cardiovascular: Normal rate, regular rhythm and normal heart sounds.   Pulmonary/Chest: Effort normal and breath sounds normal.  Musculoskeletal: Normal range of motion.  Neurological: He is alert and oriented to person, place, and time.  Skin: Skin is warm and dry.  Psychiatric: He has a normal mood and affect.          Assessment & Plan:  Trueman was seen today for follow-up.  Diagnoses and associated orders for this visit:  Unspecified essential hypertension  Orthostatic hypotension    discontinue amlodipine. Monitor blood pressure at home daily. Recheck in 3 weeks.

## 2014-02-20 NOTE — Progress Notes (Signed)
Pre visit review using our clinic review tool, if applicable. No additional management support is needed unless otherwise documented below in the visit note. 

## 2014-02-26 ENCOUNTER — Ambulatory Visit (INDEPENDENT_AMBULATORY_CARE_PROVIDER_SITE_OTHER): Payer: Commercial Managed Care - HMO | Admitting: Pulmonary Disease

## 2014-02-26 ENCOUNTER — Encounter: Payer: Self-pay | Admitting: Pulmonary Disease

## 2014-02-26 VITALS — BP 130/82 | HR 80 | Temp 98.4°F | Ht 75.0 in | Wt 228.0 lb

## 2014-02-26 DIAGNOSIS — G4733 Obstructive sleep apnea (adult) (pediatric): Secondary | ICD-10-CM | POA: Insufficient documentation

## 2014-02-26 NOTE — Patient Instructions (Signed)
Will start on cpap at a moderate pressure level.  Let me know if you are having tolerance issues. Work on weight loss followup with me again in 8 weeks.

## 2014-02-26 NOTE — Progress Notes (Signed)
Subjective:    Patient ID: Christopher Baker, male    DOB: 1934/05/21, 80 y.o.   MRN: [id number]  HPI The patient is a 80 year old male who I've been asked to see for management of obstructive sleep apnea. He has had a recent sleep study that showed severe OSA, with an AHI of 73 of events per hour and desaturation as low as 75%. His events were primarily obstructive in nature with some central events as well. He was started on CPAP and found to have an optimal pressure of around 16 cm of water. Currently, the wife is unsure if the patient's snores since she has a significant hearing deficit. She has seen an abnormal breathing pattern during sleep fairly frequently. The patient has frequent awakenings, and is not rested in the mornings upon arising. He will fall asleep during the day or night with any period of inactivity. He recently started back driving after his stroke, and does have some sleep pressure doing this. The patient states that his weight is down 7 pounds over the last few years, and his Epworth score is significantly abnormal.   Sleep Questionnaire What time do you typically go to bed?( Between what hours) 10:30pm 10:30pm at 1520 on 02/26/14 by Lilli Few, CMA How long does it take you to fall asleep? 30 mins 30 mins at 1520 on 02/26/14 by Lilli Few, CMA How many times during the night do you wake up? 4 4 at 1520 on 02/26/14 by Lilli Few, CMA What time do you get out of bed to start your day? 0630 0630 at 1520 on 02/26/14 by Lilli Few, CMA Do you drive or operate heavy machinery in your occupation? No No at 1520 on 02/26/14 by Lilli Few, CMA How much has your weight changed (up or down) over the past two years? (In pounds) 10 lb (4.536 kg) 10 lb (4.536 kg) at 1520 on 02/26/14 by Lilli Few, CMA Have you ever had a sleep study before? Yes Yes at 1520 on 02/26/14 by Lilli Few, CMA If yes, location of study?  [facility] at 1520 on 02/26/14 by Lilli Few, CMA If yes, date of study? December 20, 2013 December 20, 2013 at 1520 on 02/26/14 by Lilli Few, CMA Do you currently use CPAP? No No at 1520 on 02/26/14 by Lilli Few, CMA Do you wear oxygen at any time? No No at 1520 on 02/26/14 by Lilli Few, CMA   Review of Systems  Constitutional: Negative for fever and unexpected weight change.  HENT: Negative for congestion, dental problem, ear pain, nosebleeds, postnasal drip, rhinorrhea, sinus pressure, sneezing, sore throat and trouble swallowing.   Eyes: Negative for redness and itching.  Respiratory: Negative for cough, chest tightness, shortness of breath and wheezing.   Cardiovascular: Negative for palpitations and leg swelling.  Gastrointestinal: Negative for nausea and vomiting.  Genitourinary: Negative for dysuria.  Musculoskeletal: Negative for joint swelling.  Skin: Negative for rash.  Neurological: Negative for headaches.  Hematological: Does not bruise/bleed easily.  Psychiatric/Behavioral: Negative for dysphoric mood. The patient is not nervous/anxious.        Objective:   Physical Exam Constitutional:  Overweight male, no acute distress  HENT:  Nares patent without discharge, mild septal deviation to the left with narrowing.  Oropharynx without exudate, palate and uvula are elongated  Eyes:  Perrla, eomi, no scleral icterus  Neck:  No JVD, no TMG  Cardiovascular:  Normal rate,  regular rhythm, no rubs or gallops.  3/6 sem        Intact distal pulses  Pulmonary :  Normal breath sounds, no stridor or respiratory distress   No rales, rhonchi, or wheezing  Abdominal:  Soft, nondistended, bowel sounds present.  No tenderness noted.   Musculoskeletal:  2+ lower extremity edema noted.  Lymph Nodes:  No cervical lymphadenopathy noted  Skin:  No cyanosis noted  Neurologic:  Alert, appropriate, moves all 4 extremities without obvious  deficit.         Assessment & Plan:

## 2014-02-26 NOTE — Assessment & Plan Note (Signed)
The patient has very severe obstructive sleep apnea noted on his recent study, and will need to be started on CPAP for treatment. I have had a long discussion with him about sleep apnea, including its impact to his quality of life and cardiovascular health. He is willing to give CPAP a try, and will start at a moderate pressure level initially to help desensitization. I've also encouraged him to work aggressively on weight loss.

## 2014-02-27 ENCOUNTER — Telehealth: Payer: Self-pay | Admitting: Internal Medicine

## 2014-02-27 NOTE — Telephone Encounter (Signed)
Patient had a fall 3 weeks ago while caulking the tub and hit his ribs.  He is now concerned about his pacemaker function.  He refused to send a remote transmission and would prefer to be seen in office.  Office visit scheduled for 03/06/14.

## 2014-02-27 NOTE — Telephone Encounter (Signed)
New message           Pt recently fell and is concerned that he may have knocked a wire lose / pt is not having cp but does have pain on his side / this has been going on for about a week

## 2014-02-28 ENCOUNTER — Encounter: Payer: Self-pay | Admitting: Family

## 2014-02-28 NOTE — Telephone Encounter (Signed)
Please call pt and schedule appointment for him. He sent a My Chart message.

## 2014-03-01 ENCOUNTER — Encounter: Payer: Self-pay | Admitting: Family

## 2014-03-01 ENCOUNTER — Telehealth: Payer: Self-pay | Admitting: Pulmonary Disease

## 2014-03-01 ENCOUNTER — Ambulatory Visit (INDEPENDENT_AMBULATORY_CARE_PROVIDER_SITE_OTHER)
Admission: RE | Admit: 2014-03-01 | Discharge: 2014-03-01 | Disposition: A | Discharge status: Home / Self Care | Payer: Commercial Managed Care - HMO | Source: Ambulatory Visit | Attending: Family | Admitting: Family

## 2014-03-01 ENCOUNTER — Ambulatory Visit (INDEPENDENT_AMBULATORY_CARE_PROVIDER_SITE_OTHER): Payer: Commercial Managed Care - HMO | Admitting: Family

## 2014-03-01 VITALS — BP 128/82 | HR 98 | Ht 75.0 in | Wt 227.0 lb

## 2014-03-01 DIAGNOSIS — W010XXA Fall on same level from slipping, tripping and stumbling without subsequent striking against object, initial encounter: Secondary | ICD-10-CM

## 2014-03-01 DIAGNOSIS — Z23 Encounter for immunization: Secondary | ICD-10-CM

## 2014-03-01 DIAGNOSIS — R0781 Pleurodynia: Secondary | ICD-10-CM

## 2014-03-01 DIAGNOSIS — R296 Repeated falls: Secondary | ICD-10-CM

## 2014-03-01 DIAGNOSIS — R079 Chest pain, unspecified: Secondary | ICD-10-CM

## 2014-03-01 NOTE — Telephone Encounter (Signed)
Called [facility]---order is still processing with his insurance for authorization.   i have called and lmomtcb for the pts wife to make her aware.

## 2014-03-01 NOTE — Progress Notes (Signed)
Pre visit review using our clinic review tool, if applicable. No additional management support is needed unless otherwise documented below in the visit note. 

## 2014-03-01 NOTE — Patient Instructions (Signed)

## 2014-03-01 NOTE — Progress Notes (Signed)
Subjective:    Patient ID: Christopher Baker, male    DOB: 1933-08-08, 80 y.o.   MRN: [id number]  HPI  80 year old white male, nonsmoker is in today after a fall approximately 3 or 4 weeks ago and fell in the bathtub attempting to caulk. Reports have been left chest wall pain after hitting the side of the bathtub this persists. Rates the pain a 3/10, worse with movement or taking a deep breath. Has a pacemaker that is also concerned that he may be out of place. Has taken Tylenol relieves the pain.  Review of Systems  Constitutional: Negative.   HENT: Negative.   Respiratory: Negative.  Negative for shortness of breath.   Cardiovascular: Negative.  Negative for chest pain.  Gastrointestinal: Negative.   Endocrine: Negative.   Genitourinary: Negative.   Musculoskeletal: Positive for arthralgias.       Left chest wall pain  Skin: Negative.   Neurological: Negative.   Hematological: Negative.   Psychiatric/Behavioral: Negative.    Past Medical History  Diagnosis Date  . Hyperlipidemia   . HTN (hypertension)   . Status post placement of cardiac pacemaker DDD-  06-04-10-- LAST CHECK 09-08-10 IN EPIC    SECONDARY TO SYNCOPY AND BRADYCARDIA  . Restless leg syndrome   . Normal nuclear stress test 2008    LOW RISK--   DR  WALL  . Carotid stenosis, bilateral PER DR EARLY / DUPLEX  09-09-10      40 - 59%   BILATERALLY--- ASYMPTOMATIC  . History of echocardiogram 11-21-2006    EF 65%  . History of prostate cancer S/P RADIATION  TX  6 YRS AGO  . Psoriasis   . Arthritis with psoriasis   . GERD (gastroesophageal reflux disease)     CONTROLLED W/ PROTONIX  . Borderline diabetes     DIET CONTROLLED - PT STATES HE IS NOT DIABETIC  . Lumbar spondylosis W/ RADICULOPATHY  . Iron deficiency   . Cataract immature LEFT EYE  . PAF (paroxysmal atrial fibrillation)   . SVT (supraventricular tachycardia)     S/P ABLATION   2008  . Coronary artery disease CARDIOLOGIST- DR Crissie Sickles    09-08-10  VISIT AND PACEMAKER CHECK  IN EPIC  . Cancer     HX OF PROSTATE CANCER--TX'D WITH RADIATION  . Sleep apnea TESTED YRS AGO-- NO CPAP RX GIVEN    PT STATES HE DOES NOT THINK HE STILL HAS SLEEP APNEA--DOES NOT USE CPAP  . Stroke Oct. 14, 2014    History   Social History  . Marital Status: Married    Spouse Name: N/A    Number of Children: N/A  . Years of Education: N/A   Occupational History  . retired    Social History Main Topics  . Smoking status: Former Smoker -- 0.10 packs/day for 55 years    Types: Cigarettes    Quit date: 12/07/1998  . Smokeless tobacco: Never Used  . Alcohol Use: Yes     Comment: RARE  . Drug Use: No  . Sexual Activity: Yes    Partners: Female   Other Topics Concern  . Not on file   Social History Narrative  . No narrative on file    Past Surgical History  Procedure Laterality Date  . Replacement total knee  1997    RIGHT  . Cardiac pacemaker placement  06-04-10    DDD/ AND REMOVAL LOOR RECORDER  . Loop recorder placement  01-30-10  . Lumbar laminectomy/ diskectomy/  fusion  05-19-10    L4 - 5  . Cholecystectomy  2009  . Cardiac electrophysiology study and ablation  2008    FOR SVT  . Prostate biopsy  2007  . Transurethral resection of prostate  2006  . Inguinal hernia repair  2005    LEFT/   DONE WITH PENILE PROSTESIS SURG.  . Removal and placement penile prosthesis implant   2005    AND LEFT CORPOROPLASTY /    DONE INGUINAL REPAIR  . Penile prosthesis implant  1995  . Knee arthroscopy  06/02/2011    Procedure: ARTHROSCOPY KNEE;  Surgeon: Gearlean Alf;  Location: [facility];  Service: Orthopedics;  Laterality: Left;  LEFT KNEE ARTHROSCOPY WITH DEBRIDEMENT  . Total knee arthroplasty  12/20/2011    Procedure: TOTAL KNEE ARTHROPLASTY;  Surgeon: Gearlean Alf, MD;  Location: [facility];  Service: Orthopedics;  Laterality: Left;  . Back surgery    . Joint replacement    . Spine surgery      Family History  Problem  Relation Age of Onset  . Stroke Mother   . Early death Father     Allergies  Allergen Reactions  . Ciprofloxacin Nausea Only    REACTION: GI upset  . Other Other (See Comments)    SCOLLOPS--SUDDEN GI ATTACK    Current Outpatient Prescriptions on File Prior to Visit  Medication Sig Dispense Refill  . atorvastatin (LIPITOR) 20 MG tablet Take 1 tablet (20 mg total) by mouth daily. TAKE ONE TABLET BY MOUTH DAILY  90 tablet  3  . citalopram (CELEXA) 20 MG tablet TAKE 1 TABLET (20 MG TOTAL) BY MOUTH DAILY.  90 tablet  3  . clotrimazole-betamethasone (LOTRISONE) cream Apply 1 application topically 2 (two) times daily. For psoriasis.apply to affected are twice a day  90 g  3  . Cyanocobalamin (VITAMIN B-12 IJ) Inject as directed. Every two weeks      . diclofenac sodium (VOLTAREN) 1 % GEL Apply 1 application topically 2 (two) times daily as needed. Arthritis in both thumbs.  300 g  3  . ezetimibe (ZETIA) 10 MG tablet Take 1 tablet (10 mg total) by mouth daily.  90 tablet  3  . ipratropium (ATROVENT) 0.06 % nasal spray Place 2 sprays into both nostrils 4 (four) times daily.  45 mL  3  . iron polysaccharides (NIFEREX) 150 MG capsule Take 150 mg by mouth 2 (two) times daily.      Marland Kitchen lisinopril (PRINIVIL,ZESTRIL) 10 MG tablet Take 1 tablet (10 mg total) by mouth 2 (two) times daily.  180 tablet  3  . methotrexate (RHEUMATREX) 2.5 MG tablet TAKE 4 TABLETS ONE TIME WEEKLY  52 tablet  3  . mirabegron ER (MYRBETRIQ) 50 MG TB24 tablet Take 1 tablet (50 mg total) by mouth daily.  90 tablet  3  . pantoprazole (PROTONIX) 40 MG tablet Take 1 tablet (40 mg total) by mouth daily.  90 tablet  3  . predniSONE (DELTASONE) 5 MG tablet Take 1.5 tablets (7.5 mg total) by mouth daily.  135 tablet  3  . Rivaroxaban (XARELTO) 20 MG TABS tablet Take 1 tablet (20 mg total) by mouth daily with supper. This is the new medication for stroke prevention  90 tablet  3  . rOPINIRole (REQUIP) 2 MG tablet Take 1 tablet (2 mg  total) by mouth at bedtime.  90 tablet  3  . solifenacin (VESICARE) 10 MG tablet Take 1 tablet (10 mg total)  by mouth daily. Use vesicare or ditropan--not both  90 tablet  3  . SYRINGE-NEEDLE, DISP, 3 ML (BD ECLIPSE SYRINGE) 25G X 5/8" 3 ML MISC 1 each by Does not apply route every 14 (fourteen) days.  100 each  1   No current facility-administered medications on file prior to visit.    BP 128/82  Pulse 98  Ht 6\' 3"  (1.905 m)  Wt 227 lb (102.967 kg)  BMI 28.37 kg/m2chart    Objective:   Physical Exam  Constitutional: He appears well-developed and well-nourished.  Neck: Normal range of motion. Neck supple.  Cardiovascular: Normal rate, regular rhythm and normal heart sounds.   Pulmonary/Chest: Effort normal and breath sounds normal.    Skin: Skin is warm and dry.  Psychiatric: He has a normal mood and affect.          Assessment & Plan:  Efton was seen today for no specified reason.  Diagnoses and associated orders for this visit:  Rib pain on left side - DG Chest 2 View; Future  Fall from slipping, initial encounter - DG Chest 2 View; Future  Need for prophylactic vaccination and inoculation against influenza - Flu Vaccine QUAD 36+ mos PF IM (Fluarix Quad PF)   Call the office with any questions or concerns. Followup pending the results of the x-ray and sooner as needed.

## 2014-03-02 ENCOUNTER — Emergency Department (HOSPITAL_COMMUNITY)
Admission: EM | Admit: 2014-03-02 | Discharge: 2014-03-02 | Disposition: A | Discharge status: Home / Self Care | Payer: Medicare HMO | Attending: Emergency Medicine | Admitting: Emergency Medicine

## 2014-03-02 ENCOUNTER — Emergency Department (HOSPITAL_COMMUNITY): Payer: Medicare HMO

## 2014-03-02 ENCOUNTER — Encounter (HOSPITAL_COMMUNITY): Payer: Self-pay | Admitting: Emergency Medicine

## 2014-03-02 DIAGNOSIS — Z8673 Personal history of transient ischemic attack (TIA), and cerebral infarction without residual deficits: Secondary | ICD-10-CM | POA: Insufficient documentation

## 2014-03-02 DIAGNOSIS — R079 Chest pain, unspecified: Secondary | ICD-10-CM | POA: Insufficient documentation

## 2014-03-02 DIAGNOSIS — D509 Iron deficiency anemia, unspecified: Secondary | ICD-10-CM | POA: Insufficient documentation

## 2014-03-02 DIAGNOSIS — I2510 Atherosclerotic heart disease of native coronary artery without angina pectoris: Secondary | ICD-10-CM | POA: Insufficient documentation

## 2014-03-02 DIAGNOSIS — I4891 Unspecified atrial fibrillation: Secondary | ICD-10-CM | POA: Insufficient documentation

## 2014-03-02 DIAGNOSIS — M129 Arthropathy, unspecified: Secondary | ICD-10-CM | POA: Diagnosis not present

## 2014-03-02 DIAGNOSIS — Z8546 Personal history of malignant neoplasm of prostate: Secondary | ICD-10-CM | POA: Insufficient documentation

## 2014-03-02 DIAGNOSIS — Z872 Personal history of diseases of the skin and subcutaneous tissue: Secondary | ICD-10-CM | POA: Insufficient documentation

## 2014-03-02 DIAGNOSIS — Z87891 Personal history of nicotine dependence: Secondary | ICD-10-CM | POA: Diagnosis not present

## 2014-03-02 DIAGNOSIS — Z79899 Other long term (current) drug therapy: Secondary | ICD-10-CM | POA: Diagnosis not present

## 2014-03-02 DIAGNOSIS — E119 Type 2 diabetes mellitus without complications: Secondary | ICD-10-CM | POA: Insufficient documentation

## 2014-03-02 DIAGNOSIS — G2581 Restless legs syndrome: Secondary | ICD-10-CM | POA: Diagnosis not present

## 2014-03-02 DIAGNOSIS — I10 Essential (primary) hypertension: Secondary | ICD-10-CM | POA: Insufficient documentation

## 2014-03-02 DIAGNOSIS — Z7901 Long term (current) use of anticoagulants: Secondary | ICD-10-CM | POA: Diagnosis not present

## 2014-03-02 DIAGNOSIS — Z950 Presence of cardiac pacemaker: Secondary | ICD-10-CM | POA: Diagnosis not present

## 2014-03-02 DIAGNOSIS — E785 Hyperlipidemia, unspecified: Secondary | ICD-10-CM | POA: Insufficient documentation

## 2014-03-02 DIAGNOSIS — IMO0002 Reserved for concepts with insufficient information to code with codable children: Secondary | ICD-10-CM | POA: Diagnosis not present

## 2014-03-02 DIAGNOSIS — B029 Zoster without complications: Secondary | ICD-10-CM | POA: Insufficient documentation

## 2014-03-02 DIAGNOSIS — Z791 Long term (current) use of non-steroidal anti-inflammatories (NSAID): Secondary | ICD-10-CM | POA: Insufficient documentation

## 2014-03-02 DIAGNOSIS — K219 Gastro-esophageal reflux disease without esophagitis: Secondary | ICD-10-CM | POA: Diagnosis not present

## 2014-03-02 DIAGNOSIS — R011 Cardiac murmur, unspecified: Secondary | ICD-10-CM | POA: Insufficient documentation

## 2014-03-02 LAB — COMPREHENSIVE METABOLIC PANEL
ALBUMIN: 3 g/dL — AB (ref 3.5–5.2)
ALT: 19 U/L (ref 0–53)
AST: 17 U/L (ref 0–37)
Alkaline Phosphatase: 66 U/L (ref 39–117)
Anion gap: 10 (ref 5–15)
BUN: 26 mg/dL — ABNORMAL HIGH (ref 6–23)
CALCIUM: 9.3 mg/dL (ref 8.4–10.5)
CO2: 24 mEq/L (ref 19–32)
Chloride: 109 mEq/L (ref 96–112)
Creatinine, Ser: 1.05 mg/dL (ref 0.50–1.35)
GFR calc Af Amer: 76 mL/min — ABNORMAL LOW (ref >90)
GFR calc non Af Amer: 66 mL/min — ABNORMAL LOW (ref >90)
Glucose, Bld: 113 mg/dL — ABNORMAL HIGH (ref 70–99)
Potassium: 4.3 mEq/L (ref 3.7–5.3)
SODIUM: 143 meq/L (ref 137–147)
TOTAL PROTEIN: 5.5 g/dL — AB (ref 6.0–8.3)
Total Bilirubin: <0.2 mg/dL — ABNORMAL LOW (ref 0.3–1.2)

## 2014-03-02 LAB — CBC WITH DIFFERENTIAL/PLATELET
BASOS ABS: 0 10*3/uL (ref 0.0–0.1)
BASOS PCT: 0 % (ref 0–1)
EOS ABS: 0 10*3/uL (ref 0.0–0.7)
EOS PCT: 0 % (ref 0–5)
HCT: 35 % — ABNORMAL LOW (ref 39.0–52.0)
Hemoglobin: 11.2 g/dL — ABNORMAL LOW (ref 13.0–17.0)
Lymphocytes Relative: 9 % — ABNORMAL LOW (ref 12–46)
Lymphs Abs: 0.8 10*3/uL (ref 0.7–4.0)
MCH: 28.4 pg (ref 26.0–34.0)
MCHC: 32 g/dL (ref 30.0–36.0)
MCV: 88.6 fL (ref 78.0–100.0)
Monocytes Absolute: 0.7 10*3/uL (ref 0.1–1.0)
Monocytes Relative: 8 % (ref 3–12)
Neutro Abs: 7.7 10*3/uL (ref 1.7–7.7)
Neutrophils Relative %: 83 % — ABNORMAL HIGH (ref 43–77)
PLATELETS: 218 10*3/uL (ref 150–400)
RBC: 3.95 MIL/uL — ABNORMAL LOW (ref 4.22–5.81)
RDW: 15.8 % — ABNORMAL HIGH (ref 11.5–15.5)
WBC: 9.3 10*3/uL (ref 4.0–10.5)

## 2014-03-02 LAB — TROPONIN I
Troponin I: <0.3 ng/mL (ref <0.30)
Troponin I: <0.3 ng/mL (ref <0.30)

## 2014-03-02 LAB — PROTIME-INR
INR: 2.14 — AB (ref 0.00–1.49)
PROTHROMBIN TIME: 23.9 s — AB (ref 11.6–15.2)

## 2014-03-02 MED ORDER — VALACYCLOVIR HCL 1 G PO TABS: 1000.0000 mg | ORAL_TABLET | Freq: Three times a day (TID) | ORAL | Status: DC

## 2014-03-02 MED ORDER — MORPHINE SULFATE 4 MG/ML IJ SOLN
4.0000 mg | Freq: Once | INTRAMUSCULAR | Status: AC
  Administered 2014-03-02: 4 mg via INTRAVENOUS
  Filled 2014-03-02: qty 1

## 2014-03-02 MED ORDER — HYDROCODONE-ACETAMINOPHEN 5-325 MG PO TABS: 1.0000 | ORAL_TABLET | ORAL | Status: DC | PRN

## 2014-03-02 NOTE — ED Provider Notes (Signed)
Medical screening examination/treatment/procedure(s) were conducted as a shared visit with non-physician practitioner(s) and myself.  I personally evaluated the patient during the encounter.  Patient with four-day history of left-sided chest pain that is constant but waxes and wanes in severity. It is associated with pain with inspiration and movement. Denies any shortness of breath or nausea. Had a fall about a month ago and thinks he may have bruised his ribs. Saw PCP yesterday with negative Xray. History of bradycardia status post pacemaker history of stroke, atrial fibrillation on xarelto.   Patient has vesicular rash on left chest in dermatomal distribution and across his side and back in same pattern. Lungs are clear. EKG paced.  Troponin negative.  On xarelto, doubt PE.  Pain appears to be from zoster. Pain is reproducible and tender along vesicular rash. Not exertional. Constant pain x 5 days, worse today. Doubt PE or dissection.  D/w Dr. Radford Pax who agrees with negative enzymes and unchanged EKG, ACS is unlikely. Troponin negative x2. Treat for zoster ,return precautions discussed.  BP 165/76  Pulse 73  Temp(Src) 98.2 F (36.8 C) (Oral)  Resp 15  Ht 6\' 3"  (1.905 m)  Wt 227 lb (102.967 kg)  BMI 28.37 kg/m2  SpO2 97%    EKG Interpretation   Date/Time:  Saturday March 02 2014 08:30:18 EDT Ventricular Rate:  70 PR Interval:  175 QRS Duration: 173 QT Interval:  469 QTC Calculation: 506 R Axis:   -30 Text Interpretation:  A-V dual-paced complexes w/ some inhibition No  further analysis attempted due to paced rhythm AV dual-paced rhythm  Confirmed by Wyvonnia Dusky  MD, Patrick [phone]) on 03/02/2014 8:46:08 AM        Ezequiel Essex, MD 03/02/14 1815

## 2014-03-02 NOTE — ED Notes (Signed)
Pt to department via EMS- pt reports that he was up this morning at home and started having left sided chest pain. States that he has a rib contusion to the left side. States that he also had SOB and nausea. 324 asa and 2 nitro given en route.

## 2014-03-02 NOTE — ED Provider Notes (Signed)
CSN: [id number]     Arrival date & time 03/02/14  0827 History   First MD Initiated Contact with Patient 03/02/14 [phone]     Chief Complaint  Patient presents with  . Chest Pain   (Consider location/radiation/quality/duration/timing/severity/associated sxs/prior Treatment) HPI Christopher Baker is a 80 yo male with PMH: HTN, HLD, DM, CAD, presenting to the ED with chest pain and back pain, worse this am.  Pt states he woke up this morning and felt pressure in his chest and the same pressure in his back.  He clarifies that this pain is on his chest and on his back but does not go through his chest to his back.  As the morning progressed, he states he felt nauseated and vomited after drinking his coffee.  He rates the pressure as 7/10 this am, but 2/10 currently and that it seems to comes intermittently.  He denies fevers, chills, shortness of breath, abd pain, dizziness or weakness.   Past Medical History  Diagnosis Date  . Hyperlipidemia   . HTN (hypertension)   . Status post placement of cardiac pacemaker DDD-  06-04-10-- LAST CHECK 09-08-10 IN EPIC    SECONDARY TO SYNCOPY AND BRADYCARDIA  . Restless leg syndrome   . Normal nuclear stress test 2008    LOW RISK--   DR  WALL  . Carotid stenosis, bilateral PER DR EARLY / DUPLEX  09-09-10      40 - 59%   BILATERALLY--- ASYMPTOMATIC  . History of echocardiogram 11-21-2006    EF 65%  . History of prostate cancer S/P RADIATION  TX  6 YRS AGO  . Psoriasis   . Arthritis with psoriasis   . GERD (gastroesophageal reflux disease)     CONTROLLED W/ PROTONIX  . Borderline diabetes     DIET CONTROLLED - PT STATES HE IS NOT DIABETIC  . Lumbar spondylosis W/ RADICULOPATHY  . Iron deficiency   . Cataract immature LEFT EYE  . PAF (paroxysmal atrial fibrillation)   . SVT (supraventricular tachycardia)     S/P ABLATION   2008  . Coronary artery disease CARDIOLOGIST- DR Crissie Sickles    09-08-10 VISIT AND PACEMAKER CHECK  IN EPIC  . Cancer     HX OF  PROSTATE CANCER--TX'D WITH RADIATION  . Sleep apnea TESTED YRS AGO-- NO CPAP RX GIVEN    PT STATES HE DOES NOT THINK HE STILL HAS SLEEP APNEA--DOES NOT USE CPAP  . Stroke Oct. 14, 2014   Past Surgical History  Procedure Laterality Date  . Replacement total knee  1997    RIGHT  . Cardiac pacemaker placement  06-04-10    DDD/ AND REMOVAL LOOR RECORDER  . Loop recorder placement  01-30-10  . Lumbar laminectomy/ diskectomy/ fusion  05-19-10    L4 - 5  . Cholecystectomy  2009  . Cardiac electrophysiology study and ablation  2008    FOR SVT  . Prostate biopsy  2007  . Transurethral resection of prostate  2006  . Inguinal hernia repair  2005    LEFT/   DONE WITH PENILE PROSTESIS SURG.  . Removal and placement penile prosthesis implant   2005    AND LEFT CORPOROPLASTY /    DONE INGUINAL REPAIR  . Penile prosthesis implant  1995  . Knee arthroscopy  06/02/2011    Procedure: ARTHROSCOPY KNEE;  Surgeon: Gearlean Alf;  Location: [facility];  Service: Orthopedics;  Laterality: Left;  LEFT KNEE ARTHROSCOPY WITH DEBRIDEMENT  . Total  knee arthroplasty  12/20/2011    Procedure: TOTAL KNEE ARTHROPLASTY;  Surgeon: Gearlean Alf, MD;  Location: [facility];  Service: Orthopedics;  Laterality: Left;  . Back surgery    . Joint replacement    . Spine surgery     Family History  Problem Relation Age of Onset  . Stroke Mother   . Early death Father    History  Substance Use Topics  . Smoking status: Former Smoker -- 0.10 packs/day for 55 years    Types: Cigarettes    Quit date: 12/07/1998  . Smokeless tobacco: Never Used  . Alcohol Use: Yes     Comment: RARE    Review of Systems  Constitutional: Negative for fever and chills.  HENT: Negative for sore throat.   Eyes: Negative for visual disturbance.  Respiratory: Negative for cough and shortness of breath.   Cardiovascular: Positive for chest pain. Negative for leg swelling.  Gastrointestinal: Negative for nausea, vomiting  and diarrhea.  Genitourinary: Negative for dysuria.  Musculoskeletal: Negative for myalgias.  Skin: Negative for rash.  Neurological: Negative for weakness, numbness and headaches.      Allergies  Ciprofloxacin and Other  Home Medications   Prior to Admission medications   Medication Sig Start Date End Date Taking? Authorizing Provider  atorvastatin (LIPITOR) 20 MG tablet Take 1 tablet (20 mg total) by mouth daily. TAKE ONE TABLET BY MOUTH DAILY 08/22/13   Ricard Dillon, MD  citalopram (CELEXA) 20 MG tablet TAKE 1 TABLET (20 MG TOTAL) BY MOUTH DAILY. 08/22/13   Ricard Dillon, MD  clotrimazole-betamethasone (LOTRISONE) cream Apply 1 application topically 2 (two) times daily. For psoriasis.apply to affected are twice a day 06/29/13   Ricard Dillon, MD  Cyanocobalamin (VITAMIN B-12 IJ) Inject as directed. Every two weeks    Historical Provider, MD  diclofenac sodium (VOLTAREN) 1 % GEL Apply 1 application topically 2 (two) times daily as needed. Arthritis in both thumbs. 06/29/13   Ricard Dillon, MD  ezetimibe (ZETIA) 10 MG tablet Take 1 tablet (10 mg total) by mouth daily. 08/22/13   Ricard Dillon, MD  ipratropium (ATROVENT) 0.06 % nasal spray Place 2 sprays into both nostrils 4 (four) times daily. 08/22/13   Ricard Dillon, MD  iron polysaccharides (NIFEREX) 150 MG capsule Take 150 mg by mouth 2 (two) times daily.    Historical Provider, MD  lisinopril (PRINIVIL,ZESTRIL) 10 MG tablet Take 1 tablet (10 mg total) by mouth 2 (two) times daily. 08/22/13   Ricard Dillon, MD  methotrexate (RHEUMATREX) 2.5 MG tablet TAKE 4 TABLETS ONE TIME WEEKLY    Ricard Dillon, MD  mirabegron ER (MYRBETRIQ) 50 MG TB24 tablet Take 1 tablet (50 mg total) by mouth daily. 06/29/13   Ricard Dillon, MD  pantoprazole (PROTONIX) 40 MG tablet Take 1 tablet (40 mg total) by mouth daily. 08/22/13   Ricard Dillon, MD  predniSONE (DELTASONE) 5 MG tablet Take 1.5 tablets (7.5 mg total) by mouth daily. 08/22/13   Ricard Dillon, MD    Rivaroxaban (XARELTO) 20 MG TABS tablet Take 1 tablet (20 mg total) by mouth daily with supper. This is the new medication for stroke prevention 06/29/13   Ricard Dillon, MD  rOPINIRole (REQUIP) 2 MG tablet Take 1 tablet (2 mg total) by mouth at bedtime. 01/14/14   Timoteo Gaul, FNP  solifenacin (VESICARE) 10 MG tablet Take 1 tablet (10 mg total) by mouth daily. Use vesicare or ditropan--not  both 06/29/13   Ricard Dillon, MD  SYRINGE-NEEDLE, DISP, 3 ML (BD ECLIPSE SYRINGE) 25G X 5/8" 3 ML MISC 1 each by Does not apply route every 14 (fourteen) days. 01/14/14   Timoteo Gaul, FNP   BP 155/55  Pulse 68  Temp(Src) 98.2 F (36.8 C) (Oral)  Resp 18  Ht 6\' 3"  (1.905 m)  Wt 227 lb (102.967 kg)  BMI 28.37 kg/m2  SpO2 96% Physical Exam  Nursing note and vitals reviewed. Constitutional: He is oriented to person, place, and time. He appears well-developed and well-nourished. No distress.  HENT:  Head: Normocephalic and atraumatic.  Mouth/Throat: Oropharynx is clear and moist. No oropharyngeal exudate.  Eyes: Conjunctivae are normal. Pupils are equal, round, and reactive to light.  Neck: Neck supple. No thyromegaly present.  Cardiovascular: Normal rate, regular rhythm and intact distal pulses.  Exam reveals no gallop and no friction rub.   Murmur heard. Pulmonary/Chest: Effort normal and breath sounds normal. No respiratory distress. He has no wheezes. He has no rhonchi. He has no rales. He exhibits no tenderness.  Abdominal: Soft. There is no tenderness.  Musculoskeletal: He exhibits no tenderness.  Lymphadenopathy:    He has no cervical adenopathy.  Neurological: He is alert and oriented to person, place, and time.  Skin: Skin is warm and dry. Rash noted. Rash is vesicular. He is not diaphoretic.     Follows the dermatone, stops at the midline, skin intact, not draining  Psychiatric: He has a normal mood and affect.    ED Course  Procedures (including critical care time) Labs  Review Labs Reviewed  CBC WITH DIFFERENTIAL - Abnormal; Notable for the following:    RBC 3.95 (*)    Hemoglobin 11.2 (*)    HCT 35.0 (*)    RDW 15.8 (*)    Neutrophils Relative % 83 (*)    Lymphocytes Relative 9 (*)    All other components within normal limits  COMPREHENSIVE METABOLIC PANEL - Abnormal; Notable for the following:    Glucose, Bld 113 (*)    BUN 26 (*)    Total Protein 5.5 (*)    Albumin 3.0 (*)    Total Bilirubin <0.2 (*)    GFR calc non Af Amer 66 (*)    GFR calc Af Amer 76 (*)    All other components within normal limits  PROTIME-INR - Abnormal; Notable for the following:    Prothrombin Time 23.9 (*)    INR 2.14 (*)    All other components within normal limits  TROPONIN I  TROPONIN I   Imaging Review Dg Chest 2 View  03/01/2014   CLINICAL DATA:  Left side chest wall injury  EXAM: CHEST  2 VIEW  COMPARISON:  None.  FINDINGS: The heart size and mediastinal contours are within normal limits. Dual lead AICD. Both lungs are clear. The visualized skeletal structures are unremarkable.  IMPRESSION: No active cardiopulmonary disease.   Electronically Signed   By: Kathreen Devoid   On: 03/01/2014 16:45     EKG Interpretation   Date/Time:  Saturday March 02 2014 08:30:18 EDT Ventricular Rate:  70 PR Interval:  175 QRS Duration: 173 QT Interval:  469 QTC Calculation: 506 R Axis:   -30 Text Interpretation:  A-V dual-paced complexes w/ some inhibition No  further analysis attempted due to paced rhythm AV dual-paced rhythm  Confirmed by Wyvonnia Dusky  MD, [place] ([id number]) on 03/02/2014 8:46:08 AM      MDM   Final diagnoses:  Shingles   Chest pain in conjunction with back pain. Vesicular rash under left pectoralis and the same level on back, neither rash cross over midline.   Consider zoster vs musculoskeletal vs cardiac.    CBC, CMP, Troponin, PT-INR, CXR ordered and 2nd troponin re-checked, all without significant abnormality.   Pain managed in the ED.  Pt  appears safe to be discharge home  Chest pain is not likely of cardiac or pulmonary etiology based on presentation, only perc criteria is his age, VSS, no tracheal deviation, no JVD or new murmur, RRR, breath sounds equal bilaterally, EKG without acute abnormalities, negative troponin, and negative CXR.  Discharge instructions include prescription for Valtrex and pain meds, recommendation to follow-up with PCP and return for any worsening or concerning symptoms.  Return precautions provided.    Case has been discussed with and seen by Dr. Wyvonnia Dusky who agrees with the above plan to discharge.  Filed Vitals:   03/02/14 1014 03/02/14 1115 03/02/14 1211 03/02/14 1300  BP: 144/64 140/61 158/61 165/76  Pulse:  60 60 73  Temp:      TempSrc:      Resp:  16 16 15   Height:      Weight:      SpO2:  99% 99% 97%   Meds given in ED:  Medications  morphine 4 MG/ML injection 4 mg (4 mg Intravenous Given 03/02/14 1206)    Discharge Medication List as of 03/02/2014  1:11 PM    START taking these medications   Details  HYDROcodone-acetaminophen (NORCO/VICODIN) 5-325 MG per tablet Take 1-2 tablets by mouth every 4 (four) hours as needed for moderate pain or severe pain., Starting 03/02/2014, Until Discontinued, Print    valACYclovir (VALTREX) 1000 MG tablet Take 1 tablet (1,000 mg total) by mouth 3 (three) times daily., Starting 03/02/2014, Until Discontinued, Print           Britt Bottom, NP 03/05/14 [phone]

## 2014-03-02 NOTE — Discharge Instructions (Signed)
Please follow the directions provided.  Be sure to follow up with your primary care provider regarding the shingles.  Your caregiver has diagnosed you as having chest pain that seems to be related to the shingles, but does not require admission.  Your evaluation for an acute heart condition or other serious illness are reassuring at this point.    SEEK IMMEDIATE MEDICAL ATTENTION IF: You have severe chest pain, especially if the pain is crushing or pressure-like and spreads to the arms, back, neck, or jaw, or if you have sweating, nausea (feeling sick to your stomach), or shortness of breath. THIS IS AN EMERGENCY. Don't wait to see if the pain will go away. Get medical help at once. Call 911 or [phone] (operator). DO NOT drive yourself to the hospital.  Your chest pain gets worse and does not go away with rest.  You have an attack of chest pain lasting longer than usual, despite rest and treatment with the medications your caregiver has prescribed.  You wake from sleep with chest pain or shortness of breath.  You feel dizzy or faint.  You have chest pain not typical of your usual pain for which you originally saw your caregiver.  SEEK IMMEDIATE MEDICAL CARE IF:  You have facial pain, pain around the eye area, or loss of feeling on one side of your face.  You have ear pain or ringing in your ear.  You have loss of taste.  Your pain is not relieved with prescribed medicines.  Your redness or swelling spreads.  You have more pain and swelling.  Your condition is worsening or has changed.  You have a fever.

## 2014-03-04 ENCOUNTER — Telehealth: Payer: Self-pay | Admitting: Family

## 2014-03-04 ENCOUNTER — Encounter: Payer: Self-pay | Admitting: Pulmonary Disease

## 2014-03-04 NOTE — Telephone Encounter (Signed)
Caller: [name]/Spouse; Phone: [phone]; Reason for Call: Patient was diagnosed with shingles in the ER over the weekend.  Wife has questions - he is taking Valtrex and Hydrocodone for the shingles.  She would like to know if the patient should take Methotrexate like normal today?  Also should he start taking Neurontin?  Please contact spouse to answer these questions.

## 2014-03-04 NOTE — Telephone Encounter (Signed)
Spoke with pt's wife. Advised to continue with methotrexate. She also had questions concerning the amount of Hydrocodone to give pt and how often. Advised that he should take when he feels like he needs it and that she can give him 1/2 tablet if a whole tab if too much. Advised wife if nerve pain persists after rash has subsided and pt is still in need of pain meds, then Neurontin might be prescribed. [name] verbalized understanding

## 2014-03-04 NOTE — Telephone Encounter (Signed)
i have called an lmom for the pts wife to make her aware.

## 2014-03-05 ENCOUNTER — Other Ambulatory Visit: Payer: Self-pay | Admitting: Internal Medicine

## 2014-03-05 NOTE — Telephone Encounter (Signed)
Spoke with patients wife-she is aware that Christopher Baker is working with Insurance to get approval. Wife states she will call her Insurance to see if they can speed up approval and wife is aware to stay in contact with [employer]-phone number was given. If any other troubles pt or wife will contact our office.

## 2014-03-05 NOTE — ED Provider Notes (Signed)
Medical screening examination/treatment/procedure(s) were conducted as a shared visit with non-physician practitioner(s) and myself.  I personally evaluated the patient during the encounter.  See my additional note   EKG Interpretation   Date/Time:  Saturday March 02 2014 08:30:18 EDT Ventricular Rate:  70 PR Interval:  175 QRS Duration: 173 QT Interval:  469 QTC Calculation: 506 R Axis:   -30 Text Interpretation:  A-V dual-paced complexes w/ some inhibition No  further analysis attempted due to paced rhythm AV dual-paced rhythm  Confirmed by Wyvonnia Dusky  MD, [place] ([id number]) on 03/02/2014 8:46:08 AM       Ezequiel Essex, MD 03/05/14 1102

## 2014-03-11 ENCOUNTER — Ambulatory Visit (INDEPENDENT_AMBULATORY_CARE_PROVIDER_SITE_OTHER): Payer: Commercial Managed Care - HMO | Admitting: Physician Assistant

## 2014-03-11 ENCOUNTER — Encounter: Payer: Self-pay | Admitting: Physician Assistant

## 2014-03-11 VITALS — BP 118/76 | HR 60 | Temp 98.1°F | Resp 18 | Wt 224.4 lb

## 2014-03-11 DIAGNOSIS — B029 Zoster without complications: Secondary | ICD-10-CM

## 2014-03-11 NOTE — Progress Notes (Signed)
Subjective:    Patient ID: Christopher Baker, male    DOB: 07-22-1933, 80 y.o.   MRN: [id number]  HPI Patient is a 80 y.o. male presenting for ED follow up. Pt was seen in the ED on 03/02/2014. ED Course: HPI  Christopher Baker is a 80 yo male with PMH: HTN, HLD, DM, CAD, presenting to the ED with chest pain and back pain, worse this am. Pt states he woke up this morning and felt pressure in his chest and the same pressure in his back. He clarifies that this pain is on his chest and on his back but does not go through his chest to his back. As the morning progressed, he states he felt nauseated and vomited after drinking his coffee. He rates the pressure as 7/10 this am, but 2/10 currently and that it seems to comes intermittently. He denies fevers, chills, shortness of breath, abd pain, dizziness or weakness.   MDM Medical screening examination/treatment/procedure(s) were conducted as a shared visit with non-physician practitioner(s) and myself. I personally evaluated the patient during the encounter.  Patient with four-day history of left-sided chest pain that is constant but waxes and wanes in severity. It is associated with pain with inspiration and movement. Denies any shortness of breath or nausea. Had a fall about a month ago and thinks he may have bruised his ribs. Saw PCP yesterday with negative Xray.  History of bradycardia status post pacemaker history of stroke, atrial fibrillation on xarelto.  Patient has vesicular rash on left chest in dermatomal distribution and across his side and back in same pattern. Lungs are clear.  EKG paced. Troponin negative. On xarelto, doubt PE.  Pain appears to be from zoster. Pain is reproducible and tender along vesicular rash. Not exertional. Constant pain x 5 days, worse today. Doubt PE or dissection. D/w Dr. Radford Pax who agrees with negative enzymes and unchanged EKG, ACS is unlikely. Troponin negative x2.  Treat for zoster ,return precautions  discussed.  Pt has been taking Valtrex 3 times per day, since his ED visit. He has been tolerating this well and denies any adverse effects to the medication. He has been taking Hydrocodone occasionally, only at bedtime, however has been managing the pain mostly with OTC ibuprofen, 1 to 2 per day. He does not believe that the shingles is getting better, and actually thinks that the rash has gotten worse over the last week, and maybe only slightly better today than yesterday. He has also been complaining of an ocassionaly general headache and fatigue, which he was told may happen with the shingles at the ED. He states it is a 3/10 during the day, and around 8/10 at night. Patient denies fevers, chills, nausea, vomiting, diarrhea, shortness of breath, chest pain, syncope.   The pt had an elevated BP at the ED, however the pt feels this was a mistake, and not consistent with home BPs, nor other office visit BPs.The pt is currently normotensive.  Review of Systems As per HPI and are otherwise negative.    Past Medical History  Diagnosis Date  . Hyperlipidemia   . HTN (hypertension)   . Status post placement of cardiac pacemaker DDD-  06-04-10-- LAST CHECK 09-08-10 IN EPIC    SECONDARY TO SYNCOPY AND BRADYCARDIA  . Restless leg syndrome   . Normal nuclear stress test 2008    LOW RISK--   DR  WALL  . Carotid stenosis, bilateral PER DR EARLY / DUPLEX  09-09-10  40 - 59%   BILATERALLY--- ASYMPTOMATIC  . History of echocardiogram 11-21-2006    EF 65%  . History of prostate cancer S/P RADIATION  [place]  6 YRS AGO  . Psoriasis   . Arthritis with psoriasis   . GERD (gastroesophageal reflux disease)     CONTROLLED W/ PROTONIX  . Borderline diabetes     DIET CONTROLLED - PT STATES HE IS NOT DIABETIC  . Lumbar spondylosis W/ RADICULOPATHY  . Iron deficiency   . Cataract immature LEFT EYE  . PAF (paroxysmal atrial fibrillation)   . SVT (supraventricular tachycardia)     S/P ABLATION   2008  .  Coronary artery disease CARDIOLOGIST- DR Crissie Sickles    09-08-10 VISIT AND PACEMAKER CHECK  IN EPIC  . Cancer     HX OF PROSTATE CANCER--TX'D WITH RADIATION  . Sleep apnea TESTED YRS AGO-- NO CPAP RX GIVEN    PT STATES HE DOES NOT THINK HE STILL HAS SLEEP APNEA--DOES NOT USE CPAP  . Stroke Oct. 14, 2014    History   Social History  . Marital Status: Married    Spouse Name: N/A    Number of Children: N/A  . Years of Education: N/A   Occupational History  . retired    Social History Main Topics  . Smoking status: Former Smoker -- 0.10 packs/day for 55 years    Types: Cigarettes    Quit date: 12/07/1998  . Smokeless tobacco: Never Used  . Alcohol Use: Yes     Comment: RARE  . Drug Use: No  . Sexual Activity: Yes    Partners: Female   Other Topics Concern  . Not on file   Social History Narrative  . No narrative on file    Past Surgical History  Procedure Laterality Date  . Replacement total knee  1997    RIGHT  . Cardiac pacemaker placement  06-04-10    DDD/ AND REMOVAL LOOR RECORDER  . Loop recorder placement  01-30-10  . Lumbar laminectomy/ diskectomy/ fusion  05-19-10    L4 - 5  . Cholecystectomy  2009  . Cardiac electrophysiology study and ablation  2008    FOR SVT  . Prostate biopsy  2007  . Transurethral resection of prostate  2006  . Inguinal hernia repair  2005    LEFT/   DONE WITH PENILE PROSTESIS SURG.  . Removal and placement penile prosthesis implant   2005    AND LEFT CORPOROPLASTY /    DONE INGUINAL REPAIR  . Penile prosthesis implant  1995  . Knee arthroscopy  06/02/2011    Procedure: ARTHROSCOPY KNEE;  Surgeon: Gearlean Alf;  Location: [facility];  Service: Orthopedics;  Laterality: Left;  LEFT KNEE ARTHROSCOPY WITH DEBRIDEMENT  . Total knee arthroplasty  12/20/2011    Procedure: TOTAL KNEE ARTHROPLASTY;  Surgeon: Gearlean Alf, MD;  Location: [facility];  Service: Orthopedics;  Laterality: Left;  . Back surgery    . Joint  replacement    . Spine surgery      Family History  Problem Relation Age of Onset  . Stroke Mother   . Early death Father     Allergies  Allergen Reactions  . Ciprofloxacin Nausea Only    REACTION: GI upset  . Other Other (See Comments)    SCALLOPS--SUDDEN GI ATTACK    Current Outpatient Prescriptions on File Prior to Visit  Medication Sig Dispense Refill  . atorvastatin (LIPITOR) 20 MG tablet Take 20  mg by mouth daily.      . citalopram (CELEXA) 20 MG tablet Take 20 mg by mouth daily.      . clotrimazole-betamethasone (LOTRISONE) cream Apply 1 application topically 2 (two) times daily as needed (psoriasis).      . Cyanocobalamin (VITAMIN B-12 IJ) Inject 3 mLs as directed every 14 (fourteen) days. Every two weeks      . ezetimibe (ZETIA) 10 MG tablet Take 1 tablet (10 mg total) by mouth daily.  90 tablet  3  . ipratropium (ATROVENT) 0.06 % nasal spray Place 2 sprays into both nostrils 4 (four) times daily.  45 mL  3  . iron polysaccharides (NIFEREX) 150 MG capsule Take 150 mg by mouth 2 (two) times daily.      Marland Kitchen lisinopril (PRINIVIL,ZESTRIL) 10 MG tablet Take 1 tablet (10 mg total) by mouth 2 (two) times daily.  180 tablet  3  . methotrexate (RHEUMATREX) 2.5 MG tablet Take 10 mg by mouth once a week. Caution:Chemotherapy. Protect from light. Every monday      . mirabegron ER (MYRBETRIQ) 50 MG TB24 tablet Take 1 tablet (50 mg total) by mouth daily.  90 tablet  3  . pantoprazole (PROTONIX) 40 MG tablet Take 1 tablet (40 mg total) by mouth daily.  90 tablet  3  . predniSONE (DELTASONE) 5 MG tablet Take 1.5 tablets (7.5 mg total) by mouth daily.  135 tablet  3  . Rivaroxaban (XARELTO) 20 MG TABS tablet Take 1 tablet (20 mg total) by mouth daily with supper. This is the new medication for stroke prevention  90 tablet  3  . rOPINIRole (REQUIP) 2 MG tablet Take 1 tablet (2 mg total) by mouth at bedtime.  90 tablet  3  . solifenacin (VESICARE) 10 MG tablet Take 1 tablet (10 mg total) by  mouth daily. Use vesicare or ditropan--not both  90 tablet  3  . valACYclovir (VALTREX) 1000 MG tablet Take 1 tablet (1,000 mg total) by mouth 3 (three) times daily.  30 tablet  0  . XARELTO 20 MG TABS tablet TAKE 1 TABLET (20 MG TOTAL) BY MOUTH DAILY WITH SUPPER. THIS IS THE NEW MEDICATION FOR STROKE PREVENTION  30 tablet  4  . HYDROcodone-acetaminophen (NORCO/VICODIN) 5-325 MG per tablet Take 1-2 tablets by mouth every 4 (four) hours as needed for moderate pain or severe pain.  15 tablet  0   No current facility-administered medications on file prior to visit.    EXAM: BP 118/76  Pulse 60  Temp(Src) 98.1 F (36.7 C) (Oral)  Resp 18  Wt 224 lb 6.4 oz (101.787 kg)     Objective:   Physical Exam  Nursing note and vitals reviewed. Constitutional: He is oriented to person, place, and time. He appears well-developed and well-nourished. No distress.  HENT:  Head: Normocephalic and atraumatic.  Eyes: Conjunctivae and EOM are normal. Pupils are equal, round, and reactive to light.  Neck: Normal range of motion. Neck supple.  Cardiovascular: Normal rate, regular rhythm and intact distal pulses.   Pulmonary/Chest: Effort normal and breath sounds normal. No respiratory distress. He has no wheezes. He has no rales. He exhibits no tenderness.  Musculoskeletal: Normal range of motion.  Neurological: He is alert and oriented to person, place, and time.  Skin: Skin is warm and dry. Rash noted. He is not diaphoretic. No pallor.  Vesicular, erythematous rash in a band like pattern around the left chest wall, anterior and posterior. No surrounding erythema.  Psychiatric:  He has a normal mood and affect. His behavior is normal. Judgment and thought content normal.     Lab Results  Component Value Date   WBC 9.3 03/02/2014   HGB 11.2* 03/02/2014   HCT 35.0* 03/02/2014   PLT 218 03/02/2014   GLUCOSE 113* 03/02/2014   CHOL 130 05/21/2013   TRIG 122.0 05/21/2013   HDL 43.90 05/21/2013   LDLDIRECT 75.1  07/20/2010   LDLCALC 62 05/21/2013   ALT 19 03/02/2014   AST 17 03/02/2014   NA 143 03/02/2014   K 4.3 03/02/2014   CL 109 03/02/2014   CREATININE 1.05 03/02/2014   BUN 26* 03/02/2014   CO2 24 03/02/2014   TSH 1.91 05/21/2013   PSA 0.33 05/21/2013   INR 2.14* 03/02/2014   HGBA1C 5.5 04/04/2013        Assessment & Plan:  Diquan was seen today for hospitalization follow-up.  Diagnoses and associated orders for this visit:  Shingles Comments: Continue course of valtrex to completion. Ibuprofen for pain. Watchful waiting.    I spoke with Dr. Elease Hashimoto regarding this patient, and I agree with him that the patient likely has been adequately treated for shingles, however it may just take a while longer before the rash has healed and resolves. Also, as ibuprofen has been working to control his pain, he should continue this for the time being. If his pain persists despite resolution of his rash, he should return for evaluation of postherpetic neuralgia. Patient is amenable to this plan.  Return precautions provided, and patient handout on shingles.  Plan to follow up as needed, or for worsening or persistent symptoms despite treatment.  Patient Instructions  You can try restricting the Hydrocodone to bedtime to lessen the noticeable drowsiness it causes.  Otherwise, continue using Ibuprofen for the pain as this has been helping.  The rash may take a little longer to start healing. continue taking your Valtrex as directed until you have completed the entire course.  After the rash has healed, if you are still continuing to experience pain, return to clinic for evaluation of post herpetic neuralgia.  If emergency symptoms discussed during visit developed, seek medical attention immediately.  Followup as needed, or for worsening or persistent symptoms despite treatment.

## 2014-03-11 NOTE — Patient Instructions (Addendum)
You can try restricting the Hydrocodone to bedtime to lessen the noticeable drowsiness it causes.  Otherwise, continue using Ibuprofen for the pain as this has been helping.  The rash may take a little longer to start healing. continue taking your Valtrex as directed until you have completed the entire course.  After the rash has healed, if you are still continuing to experience pain, return to clinic for evaluation of post herpetic neuralgia.  If emergency symptoms discussed during visit developed, seek medical attention immediately.  Followup as needed, or for worsening or persistent symptoms despite treatment.    Shingles Shingles is caused by the same virus that causes chickenpox. The first feelings may be pain or tingling. A rash will follow in a couple days. The rash may occur on any area of the body. Long-lasting pain is more likely in an elderly person. It can last months to years. There are medicines that can help prevent pain if you start taking them early. HOME CARE   Take cool baths or place cool cloths on the rash as told by your doctor.  Take medicine only as told by your doctor.  Rest as told by your doctor.  Keep your rash clean with mild soap and cool water or as told by your doctor.  Do not scratch your rash. You may use calamine lotion to relieve itchy skin as told by your doctor.  Keep your rash covered with a loose bandage (dressing).  Avoid touching:  Babies.  Pregnant women.  Children with inflamed skin (eczema).  People who have gotten organ transplants.  People with chronic illnesses, such as leukemia or AIDS.  Wear loose-fitting clothing.  If the rash is on the face, you may need to see a specialist. Keep all appointments. Shingles must be kept away from the eyes, if possible.  Keep all follow-up visits as told by your doctor. GET HELP RIGHT AWAY IF:   You have any pain on the face or eye.  You lose feeling on one side of your face.  You  have ear pain or ringing in your ear.  You cannot taste as well.  Your medicines do not help the pain.  Your redness or puffiness (swelling) spreads.  You feel like you are getting worse.  You have a fever. MAKE SURE YOU:   Understand these instructions.  Will watch your condition.  Will get help right away if you are not doing well or get worse. Document Released: 11/24/2007 Document Revised: 10/22/2013 Document Reviewed: 11/24/2007 [facility] Patient Information 2015 [place], [place]. This information is not intended to replace advice given to you by your health care provider. Make sure you discuss any questions you have with your health care provider.

## 2014-03-13 ENCOUNTER — Ambulatory Visit: Payer: Commercial Managed Care - HMO | Admitting: Family

## 2014-03-14 ENCOUNTER — Encounter: Payer: Self-pay | Admitting: Family

## 2014-03-14 ENCOUNTER — Ambulatory Visit (INDEPENDENT_AMBULATORY_CARE_PROVIDER_SITE_OTHER): Payer: Commercial Managed Care - HMO | Admitting: Family

## 2014-03-14 VITALS — BP 118/60 | HR 89 | Ht 75.0 in | Wt 227.0 lb

## 2014-03-14 DIAGNOSIS — S139XXA Sprain of joints and ligaments of unspecified parts of neck, initial encounter: Secondary | ICD-10-CM

## 2014-03-14 DIAGNOSIS — I10 Essential (primary) hypertension: Secondary | ICD-10-CM

## 2014-03-14 DIAGNOSIS — S161XXA Strain of muscle, fascia and tendon at neck level, initial encounter: Secondary | ICD-10-CM

## 2014-03-14 NOTE — Progress Notes (Signed)
Pre visit review using our clinic review tool, if applicable. No additional management support is needed unless otherwise documented below in the visit note. 

## 2014-03-14 NOTE — Patient Instructions (Signed)

## 2014-03-14 NOTE — Progress Notes (Signed)
Subjective:    Patient ID: Christopher Baker, male    DOB: October 17, 1933, 80 y.o.   MRN: [id number]  HPI 80 year old white male, nonsmoker with a history of hypertension and is in today for recheck after being discontinued off amlodipine for hypotension. Reports is doing well. Not taken amlodipine. Rarely has dizzy spells since discontinuing the medication.  Does have concern of left neck pain is worse with movement. Thinks it may be related to the weight he slept. Symptoms ongoing x1 week but improving. Has been taking ibuprofen that helps. Reports increased stress.   Review of Systems  Constitutional: Negative.   HENT: Negative.   Respiratory: Negative.   Cardiovascular: Negative.   Gastrointestinal: Negative.   Endocrine: Negative.   Genitourinary: Negative.   Musculoskeletal: Positive for neck stiffness.       Worse with movement.  Skin: Negative.   Psychiatric/Behavioral: Negative.    Past Medical History  Diagnosis Date  . Hyperlipidemia   . HTN (hypertension)   . Status post placement of cardiac pacemaker DDD-  06-04-10-- LAST CHECK 09-08-10 IN EPIC    SECONDARY TO SYNCOPY AND BRADYCARDIA  . Restless leg syndrome   . Normal nuclear stress test 2008    LOW RISK--   DR  WALL  . Carotid stenosis, bilateral PER DR EARLY / DUPLEX  09-09-10      40 - 59%   BILATERALLY--- ASYMPTOMATIC  . History of echocardiogram 11-21-2006    EF 65%  . History of prostate cancer S/P RADIATION  [place]  6 YRS AGO  . Psoriasis   . Arthritis with psoriasis   . GERD (gastroesophageal reflux disease)     CONTROLLED W/ PROTONIX  . Borderline diabetes     DIET CONTROLLED - PT STATES HE IS NOT DIABETIC  . Lumbar spondylosis W/ RADICULOPATHY  . Iron deficiency   . Cataract immature LEFT EYE  . PAF (paroxysmal atrial fibrillation)   . SVT (supraventricular tachycardia)     S/P ABLATION   2008  . Coronary artery disease CARDIOLOGIST- DR Crissie Sickles    09-08-10 VISIT AND PACEMAKER CHECK  IN EPIC  .  Cancer     HX OF PROSTATE CANCER--TX'D WITH RADIATION  . Sleep apnea TESTED YRS AGO-- NO CPAP RX GIVEN    PT STATES HE DOES NOT THINK HE STILL HAS SLEEP APNEA--DOES NOT USE CPAP  . Stroke Oct. 14, 2014    History   Social History  . Marital Status: Married    Spouse Name: N/A    Number of Children: N/A  . Years of Education: N/A   Occupational History  . retired    Social History Main Topics  . Smoking status: Former Smoker -- 0.10 packs/day for 55 years    Types: Cigarettes    Quit date: 12/07/1998  . Smokeless tobacco: Never Used  . Alcohol Use: Yes     Comment: RARE  . Drug Use: No  . Sexual Activity: Yes    Partners: Female   Other Topics Concern  . Not on file   Social History Narrative  . No narrative on file    Past Surgical History  Procedure Laterality Date  . Replacement total knee  1997    RIGHT  . Cardiac pacemaker placement  06-04-10    DDD/ AND REMOVAL LOOR RECORDER  . Loop recorder placement  01-30-10  . Lumbar laminectomy/ diskectomy/ fusion  05-19-10    L4 - 5  . Cholecystectomy  2009  . Cardiac  electrophysiology study and ablation  2008    FOR SVT  . Prostate biopsy  2007  . Transurethral resection of prostate  2006  . Inguinal hernia repair  2005    LEFT/   DONE WITH PENILE PROSTESIS SURG.  . Removal and placement penile prosthesis implant   2005    AND LEFT CORPOROPLASTY /    DONE INGUINAL REPAIR  . Penile prosthesis implant  1995  . Knee arthroscopy  06/02/2011    Procedure: ARTHROSCOPY KNEE;  Surgeon: Gearlean Alf;  Location: [facility];  Service: Orthopedics;  Laterality: Left;  LEFT KNEE ARTHROSCOPY WITH DEBRIDEMENT  . Total knee arthroplasty  12/20/2011    Procedure: TOTAL KNEE ARTHROPLASTY;  Surgeon: Gearlean Alf, MD;  Location: [facility];  Service: Orthopedics;  Laterality: Left;  . Back surgery    . Joint replacement    . Spine surgery      Family History  Problem Relation Age of Onset  . Stroke Mother   .  Early death Father     Allergies  Allergen Reactions  . Ciprofloxacin Nausea Only    REACTION: GI upset  . Other Other (See Comments)    SCALLOPS--SUDDEN GI ATTACK    Current Outpatient Prescriptions on File Prior to Visit  Medication Sig Dispense Refill  . atorvastatin (LIPITOR) 20 MG tablet Take 20 mg by mouth daily.      . citalopram (CELEXA) 20 MG tablet Take 20 mg by mouth daily.      . clotrimazole-betamethasone (LOTRISONE) cream Apply 1 application topically 2 (two) times daily as needed (psoriasis).      . Cyanocobalamin (VITAMIN B-12 IJ) Inject 3 mLs as directed every 14 (fourteen) days. Every two weeks      . ezetimibe (ZETIA) 10 MG tablet Take 1 tablet (10 mg total) by mouth daily.  90 tablet  3  . HYDROcodone-acetaminophen (NORCO/VICODIN) 5-325 MG per tablet Take 1-2 tablets by mouth every 4 (four) hours as needed for moderate pain or severe pain.  15 tablet  0  . ipratropium (ATROVENT) 0.06 % nasal spray Place 2 sprays into both nostrils 4 (four) times daily.  45 mL  3  . iron polysaccharides (NIFEREX) 150 MG capsule Take 150 mg by mouth 2 (two) times daily.      Marland Kitchen lisinopril (PRINIVIL,ZESTRIL) 10 MG tablet Take 1 tablet (10 mg total) by mouth 2 (two) times daily.  180 tablet  3  . methotrexate (RHEUMATREX) 2.5 MG tablet Take 10 mg by mouth once a week. Caution:Chemotherapy. Protect from light. Every monday      . mirabegron ER (MYRBETRIQ) 50 MG TB24 tablet Take 1 tablet (50 mg total) by mouth daily.  90 tablet  3  . pantoprazole (PROTONIX) 40 MG tablet Take 1 tablet (40 mg total) by mouth daily.  90 tablet  3  . predniSONE (DELTASONE) 5 MG tablet Take 1.5 tablets (7.5 mg total) by mouth daily.  135 tablet  3  . Rivaroxaban (XARELTO) 20 MG TABS tablet Take 1 tablet (20 mg total) by mouth daily with supper. This is the new medication for stroke prevention  90 tablet  3  . rOPINIRole (REQUIP) 2 MG tablet Take 1 tablet (2 mg total) by mouth at bedtime.  90 tablet  3  .  solifenacin (VESICARE) 10 MG tablet Take 1 tablet (10 mg total) by mouth daily. Use vesicare or ditropan--not both  90 tablet  3  . valACYclovir (VALTREX) 1000 MG tablet Take  1 tablet (1,000 mg total) by mouth 3 (three) times daily.  30 tablet  0   No current facility-administered medications on file prior to visit.    BP 118/60  Pulse 89  Ht 6\' 3"  (1.905 m)  Wt 227 lb (102.967 kg)  BMI 28.37 kg/m2chart    Objective:   Physical Exam  Constitutional: He is oriented to person, place, and time. He appears well-developed and well-nourished.  Neck: Normal range of motion.  Tenderness to palpation of the left posterior neck. Worse with flexion.  Cardiovascular: Normal rate and regular rhythm.   Pulmonary/Chest: Effort normal and breath sounds normal.  Musculoskeletal: Normal range of motion.  Neurological: He is alert and oriented to person, place, and time.  Skin: Skin is warm and dry.  Psychiatric: He has a normal mood and affect.          Assessment & Plan:  Christopher Baker was seen today for follow-up.  Diagnoses and associated orders for this visit:  Unspecified essential hypertension  Strain of neck muscle, initial encounter    Call the  office with any questions or concerns. Offered a low-dose muscle relaxant the patient declined. We'll use ibuprofen and alternate ice and heat. Recheck as scheduled.

## 2014-03-21 ENCOUNTER — Ambulatory Visit (INDEPENDENT_AMBULATORY_CARE_PROVIDER_SITE_OTHER): Payer: Commercial Managed Care - HMO | Admitting: *Deleted

## 2014-03-21 ENCOUNTER — Encounter: Payer: Self-pay | Admitting: Internal Medicine

## 2014-03-21 DIAGNOSIS — I480 Paroxysmal atrial fibrillation: Secondary | ICD-10-CM

## 2014-03-21 LAB — MDC_IDC_ENUM_SESS_TYPE_INCLINIC
Battery Impedance: 180 Ohm
Battery Voltage: 2.79 V
Brady Statistic AP VS Percent: 25 %
Date Time Interrogation Session: 20151001154519
Lead Channel Impedance Value: 399 Ohm
Lead Channel Pacing Threshold Pulse Width: 0.4 ms
Lead Channel Pacing Threshold Pulse Width: 0.4 ms
Lead Channel Setting Pacing Amplitude: 2 V
Lead Channel Setting Pacing Amplitude: 2.5 V
Lead Channel Setting Sensing Sensitivity: 2.8 mV
MDC IDC MSMT BATTERY REMAINING LONGEVITY: 136 mo
MDC IDC MSMT LEADCHNL RA PACING THRESHOLD AMPLITUDE: 0.75 V
MDC IDC MSMT LEADCHNL RA SENSING INTR AMPL: 2.8 mV
MDC IDC MSMT LEADCHNL RV IMPEDANCE VALUE: 361 Ohm
MDC IDC MSMT LEADCHNL RV PACING THRESHOLD AMPLITUDE: 1.25 V
MDC IDC MSMT LEADCHNL RV SENSING INTR AMPL: 8 mV
MDC IDC SET LEADCHNL RV PACING PULSEWIDTH: 0.4 ms
MDC IDC STAT BRADY AP VP PERCENT: 1 %
MDC IDC STAT BRADY AS VP PERCENT: 1 %
MDC IDC STAT BRADY AS VS PERCENT: 74 %

## 2014-03-21 NOTE — Progress Notes (Signed)
Pacemaker check in clinic. Normal device function. Thresholds, sensing, impedances consistent with previous measurements. Device programmed to maximize longevity. 119 mode switches all < 1 minute.  10 high ventricular rates noted 3-10 seconds, max V 233bpm.   Device programmed at appropriate safety margins. Histogram distribution appropriate for patient activity level. Device programmed to optimize intrinsic conduction. Estimated longevity 11.5 years. Patient enrolled in remote follow-up/TTM's with [employer]. Plan to follow every 3 months remotely and see annually in office. Patient education completed.  [facility] 06/25/13.

## 2014-03-22 ENCOUNTER — Other Ambulatory Visit: Payer: Self-pay | Admitting: Internal Medicine

## 2014-03-26 ENCOUNTER — Telehealth: Payer: Self-pay

## 2014-03-26 NOTE — Telephone Encounter (Signed)
Fax received from [facility] for documentation of flu vaccine. Called pt to advise him that he was given a flu vaccine in the office on 03/01/2014, therefore he has received 2 flu vaccines

## 2014-03-29 ENCOUNTER — Encounter: Payer: Self-pay | Admitting: Physical Medicine & Rehabilitation

## 2014-03-29 ENCOUNTER — Ambulatory Visit (HOSPITAL_BASED_OUTPATIENT_CLINIC_OR_DEPARTMENT_OTHER): Payer: Commercial Managed Care - HMO | Admitting: Physical Medicine & Rehabilitation

## 2014-03-29 ENCOUNTER — Encounter: Payer: Commercial Managed Care - HMO | Attending: Physical Medicine & Rehabilitation

## 2014-03-29 ENCOUNTER — Telehealth: Payer: Self-pay | Admitting: Family

## 2014-03-29 VITALS — BP 145/68 | HR 81 | Resp 14 | Ht 75.0 in | Wt 223.2 lb

## 2014-03-29 DIAGNOSIS — I6991 Cognitive deficits following unspecified cerebrovascular disease: Secondary | ICD-10-CM | POA: Insufficient documentation

## 2014-03-29 DIAGNOSIS — Z8673 Personal history of transient ischemic attack (TIA), and cerebral infarction without residual deficits: Secondary | ICD-10-CM

## 2014-03-29 DIAGNOSIS — I69919 Unspecified symptoms and signs involving cognitive functions following unspecified cerebrovascular disease: Secondary | ICD-10-CM

## 2014-03-29 MED ORDER — CITALOPRAM HYDROBROMIDE 20 MG PO TABS: 30.0000 mg | ORAL_TABLET | Freq: Every day | ORAL | Status: DC

## 2014-03-29 NOTE — Telephone Encounter (Signed)
[place], [facility] requesting refill of TAC/EUCERIN 1:4 0.1% CRM # [id number], last filled 06/16/13

## 2014-03-29 NOTE — Patient Instructions (Signed)
May reduce celexa if increase sedation

## 2014-03-29 NOTE — Progress Notes (Signed)
Subjective:    Patient ID: Christopher Baker, male    DOB: 1934/05/18, 80 y.o.   MRN: [id number] Admitted 04/03/2013 with left-sided weakness and facial droop. Cranial CT scan showed right cortical and subcortical infarcts(right caudate head, right parietal lobe). Infarct felt to be embolic secondary to history of PAF.  HPI  Started with CPAP for sleep apnea Has shingles, first episode Left chest Not driving much, only daytime Still has STM loss, loses  Keys, wallets, etc 2 x per week Still with balance problems Less concerned about appearance, decreased attn to detail Not following through on projects  Pain Inventory Average Pain 2 Pain Right Now 0 My pain is intermittent, dull and aching  In the last 24 hours, has pain interfered with the following? General activity 1 Relation with others 0 Enjoyment of life 1 What TIME of day is your pain at its worst? night Sleep (in general) Fair  Pain is worse with: inactivity Pain improves with: therapy/exercise Relief from Meds: 9  Mobility walk without assistance  Function retired  Neuro/Psych tremor dizziness  Prior Studies Any changes since last visit?  no  Physicians involved in your care Any changes since last visit?  no   Family History  Problem Relation Age of Onset  . Stroke Mother   . Early death Father    History   Social History  . Marital Status: Married    Spouse Name: N/A    Number of Children: N/A  . Years of Education: N/A   Occupational History  . retired    Social History Main Topics  . Smoking status: Former Smoker -- 0.10 packs/day for 55 years    Types: Cigarettes    Quit date: 12/07/1998  . Smokeless tobacco: Never Used  . Alcohol Use: Yes     Comment: RARE  . Drug Use: No  . Sexual Activity: Yes    Partners: Female   Other Topics Concern  . None   Social History Narrative  . None   Past Surgical History  Procedure Laterality Date  . Replacement total knee  1997    RIGHT    . Cardiac pacemaker placement  06-04-10    DDD/ AND REMOVAL LOOR RECORDER  . Loop recorder placement  01-30-10  . Lumbar laminectomy/ diskectomy/ fusion  05-19-10    L4 - 5  . Cholecystectomy  2009  . Cardiac electrophysiology study and ablation  2008    FOR SVT  . Prostate biopsy  2007  . Transurethral resection of prostate  2006  . Inguinal hernia repair  2005    LEFT/   DONE WITH PENILE PROSTESIS SURG.  . Removal and placement penile prosthesis implant   2005    AND LEFT CORPOROPLASTY /    DONE INGUINAL REPAIR  . Penile prosthesis implant  1995  . Knee arthroscopy  06/02/2011    Procedure: ARTHROSCOPY KNEE;  Surgeon: Gearlean Alf;  Location: [place];  Service: Orthopedics;  Laterality: Left;  LEFT KNEE ARTHROSCOPY WITH DEBRIDEMENT  . Total knee arthroplasty  12/20/2011    Procedure: TOTAL KNEE ARTHROPLASTY;  Surgeon: Gearlean Alf, MD;  Location: [facility];  Service: Orthopedics;  Laterality: Left;  . Back surgery    . Joint replacement    . Spine surgery     Past Medical History  Diagnosis Date  . Hyperlipidemia   . HTN (hypertension)   . Status post placement of cardiac pacemaker DDD-  06-04-10-- LAST CHECK 09-08-10 IN  EPIC    SECONDARY TO SYNCOPY AND BRADYCARDIA  . Restless leg syndrome   . Normal nuclear stress test 2008    LOW RISK--   DR  WALL  . Carotid stenosis, bilateral PER DR EARLY / DUPLEX  09-09-10      40 - 59%   BILATERALLY--- ASYMPTOMATIC  . History of echocardiogram 11-21-2006    EF 65%  . History of prostate cancer S/P RADIATION  TX  6 YRS AGO  . Psoriasis   . Arthritis with psoriasis   . GERD (gastroesophageal reflux disease)     CONTROLLED W/ PROTONIX  . Borderline diabetes     DIET CONTROLLED - PT STATES HE IS NOT DIABETIC  . Lumbar spondylosis W/ RADICULOPATHY  . Iron deficiency   . Cataract immature LEFT EYE  . PAF (paroxysmal atrial fibrillation)   . SVT (supraventricular tachycardia)     S/P ABLATION   2008  . Coronary  artery disease CARDIOLOGIST- DR Crissie Sickles    09-08-10 VISIT AND PACEMAKER CHECK  IN EPIC  . Cancer     HX OF PROSTATE CANCER--TX'D WITH RADIATION  . Sleep apnea TESTED YRS AGO-- NO CPAP RX GIVEN    PT STATES HE DOES NOT THINK HE STILL HAS SLEEP APNEA--DOES NOT USE CPAP  . Stroke Oct. 14, 2014   BP 145/68  Pulse 81  Resp 14  Ht 6\' 3"  (1.905 m)  Wt 223 lb 3.2 oz (101.243 kg)  BMI 27.90 kg/m2  SpO2 97%  Opioid Risk Score:   Fall Risk Score: Low Fall Risk (0-5 points)   Review of Systems     Objective:   Physical Exam  Erythema Left chest, no open vesicles A few satellite lesion near scap Sensation intact bilateral upper extremity Motor strength is 4/5 in the left triceps otherwise 5/5 in the left upper extremity 5/5 bilateral lower extremities Latency of responses increased for questions. Difficulty remembering recent doctor visits, which doctor he saw first, but were recommendations. His wife had to fill him in  Able to follow and heel walk but not perform tandem gait    Assessment & Plan:  Right MCA infarct with cognitive deficits, STM problems , visual perceptual deficits have improved, residual left upper extremity weakness as well as decreased balance.  Recommend continued home exercise program   Long discussion with wife as well as the patient. Do not think cognitive deficits will resolve. There may be some improvement over time.  Also wife spoke to me regarding marital stress and had specific concerns that she was stressing him out and causing him to decline in function. We discussed that any social situation will not cause additional brain dysfunction but may reduce performance for a period of time  Over half of the 25 Christopher visit was spent counseling and coordinating care.

## 2014-04-03 MED ORDER — TRIAMCINOLONE ACETONIDE 0.1 % EX CREA: TOPICAL_CREAM | CUTANEOUS | Status: DC

## 2014-04-03 NOTE — Telephone Encounter (Signed)
Pharmacy clarification: Triamcinilone cream with Eucerin. Sent to pharmacy as directed by pharmacist

## 2014-04-05 ENCOUNTER — Other Ambulatory Visit: Payer: Self-pay

## 2014-04-12 ENCOUNTER — Encounter: Payer: Self-pay | Admitting: Family

## 2014-04-12 ENCOUNTER — Ambulatory Visit (INDEPENDENT_AMBULATORY_CARE_PROVIDER_SITE_OTHER): Payer: Medicare HMO | Admitting: Family

## 2014-04-12 VITALS — BP 120/60 | HR 92 | Ht 75.0 in | Wt 220.8 lb

## 2014-04-12 DIAGNOSIS — Z0000 Encounter for general adult medical examination without abnormal findings: Secondary | ICD-10-CM

## 2014-04-12 DIAGNOSIS — I4891 Unspecified atrial fibrillation: Secondary | ICD-10-CM

## 2014-04-12 DIAGNOSIS — I10 Essential (primary) hypertension: Secondary | ICD-10-CM

## 2014-04-12 DIAGNOSIS — Z8546 Personal history of malignant neoplasm of prostate: Secondary | ICD-10-CM

## 2014-04-12 DIAGNOSIS — B0223 Postherpetic polyneuropathy: Secondary | ICD-10-CM

## 2014-04-12 DIAGNOSIS — G4733 Obstructive sleep apnea (adult) (pediatric): Secondary | ICD-10-CM

## 2014-04-12 DIAGNOSIS — E785 Hyperlipidemia, unspecified: Secondary | ICD-10-CM

## 2014-04-12 DIAGNOSIS — R42 Dizziness and giddiness: Secondary | ICD-10-CM

## 2014-04-12 LAB — CBC WITH DIFFERENTIAL/PLATELET
BASOS PCT: 0.3 % (ref 0.0–3.0)
Basophils Absolute: 0 10*3/uL (ref 0.0–0.1)
EOS ABS: 0 10*3/uL (ref 0.0–0.7)
Eosinophils Relative: 0.2 % (ref 0.0–5.0)
HCT: 37.6 % — ABNORMAL LOW (ref 39.0–52.0)
Hemoglobin: 11.8 g/dL — ABNORMAL LOW (ref 13.0–17.0)
Lymphocytes Relative: 6.6 % — ABNORMAL LOW (ref 12.0–46.0)
Lymphs Abs: 0.6 10*3/uL — ABNORMAL LOW (ref 0.7–4.0)
MCHC: 31.3 g/dL (ref 30.0–36.0)
MCV: 89.6 fl (ref 78.0–100.0)
Monocytes Absolute: 0.7 10*3/uL (ref 0.1–1.0)
Monocytes Relative: 7.9 % (ref 3.0–12.0)
NEUTROS ABS: 7.5 10*3/uL (ref 1.4–7.7)
NEUTROS PCT: 85 % — AB (ref 43.0–77.0)
Platelets: 249 10*3/uL (ref 150.0–400.0)
RBC: 4.2 Mil/uL — AB (ref 4.22–5.81)
RDW: 17.9 % — ABNORMAL HIGH (ref 11.5–15.5)
WBC: 8.8 10*3/uL (ref 4.0–10.5)

## 2014-04-12 LAB — LIPID PANEL
CHOL/HDL RATIO: 2
Cholesterol: 123 mg/dL (ref 0–200)
HDL: 50.8 mg/dL (ref >39.00)
LDL Cholesterol: 61 mg/dL (ref 0–99)
NONHDL: 72.2
Triglycerides: 57 mg/dL (ref 0.0–149.0)
VLDL: 11.4 mg/dL (ref 0.0–40.0)

## 2014-04-12 LAB — HEPATIC FUNCTION PANEL
ALK PHOS: 67 U/L (ref 39–117)
ALT: 20 U/L (ref 0–53)
AST: 19 U/L (ref 0–37)
Albumin: 3.1 g/dL — ABNORMAL LOW (ref 3.5–5.2)
BILIRUBIN DIRECT: 0.1 mg/dL (ref 0.0–0.3)
Total Bilirubin: 0.7 mg/dL (ref 0.2–1.2)
Total Protein: 6.3 g/dL (ref 6.0–8.3)

## 2014-04-12 LAB — BASIC METABOLIC PANEL
BUN: 25 mg/dL — ABNORMAL HIGH (ref 6–23)
CALCIUM: 9.3 mg/dL (ref 8.4–10.5)
CO2: 23 mEq/L (ref 19–32)
Chloride: 111 mEq/L (ref 96–112)
Creatinine, Ser: 1.3 mg/dL (ref 0.4–1.5)
GFR: 55.65 mL/min — ABNORMAL LOW (ref >60.00)
Glucose, Bld: 95 mg/dL (ref 70–99)
Potassium: 4.7 mEq/L (ref 3.5–5.1)
SODIUM: 142 meq/L (ref 135–145)

## 2014-04-12 MED ORDER — GABAPENTIN 300 MG PO CAPS: 300.0000 mg | ORAL_CAPSULE | Freq: Every day | ORAL | Status: DC

## 2014-04-12 NOTE — Progress Notes (Signed)
Subjective:    Patient ID: Christopher Baker, male    DOB: January 04, 1934, 80 y.o.   MRN: [id number]  HPI  80 year old white male, nonsmoker, with a history of CVA, HTN, HLD, GERD, history of prostate cancer, OSA, and RA is in today for CPX. Continues to have achiness for the results of healing shingles to his left torso. The rash is completely resolved. Initial outbreak approximately 6 weeks ago. Tolerating medications well.  Patient presents for yearly preventative medicine examination. Medicare questionnaire was completed  All immunizations and health maintenance protocols were reviewed with the patient and needed orders were placed.  Appropriate screening laboratory values were ordered for the patient including screening of hyperlipidemia, renal function and hepatic function.  He currently sees urology for management of previous prostate cancer.  Medication reconciliation,  past medical history, social history, problem list and allergies were reviewed in detail with the patient  Goals were established with regard to weight loss, exercise, and  diet in compliance with medications  End of life planning was discussed.   Review of Systems  Constitutional: Negative.   HENT: Negative.   Eyes: Negative.   Respiratory: Negative.   Cardiovascular: Negative.   Gastrointestinal: Negative.   Endocrine: Negative.   Genitourinary: Negative.   Musculoskeletal: Negative.   Skin: Positive for rash.       Itchiness from healing shingles   Allergic/Immunologic: Negative.   Neurological: Positive for dizziness and light-headedness.       When leaning forward or going from a sitting to standing position.   Hematological: Negative.   Psychiatric/Behavioral: Negative.    Past Medical History  Diagnosis Date  . Hyperlipidemia   . HTN (hypertension)   . Status post placement of cardiac pacemaker DDD-  06-04-10-- LAST CHECK 09-08-10 IN EPIC    SECONDARY TO SYNCOPY AND BRADYCARDIA  . Restless leg  syndrome   . Normal nuclear stress test 2008    LOW RISK--   DR  WALL  . Carotid stenosis, bilateral PER DR EARLY / DUPLEX  09-09-10      40 - 59%   BILATERALLY--- ASYMPTOMATIC  . History of echocardiogram 11-21-2006    EF 65%  . History of prostate cancer S/P RADIATION  TX  6 YRS AGO  . Psoriasis   . Arthritis with psoriasis   . GERD (gastroesophageal reflux disease)     CONTROLLED W/ PROTONIX  . Borderline diabetes     DIET CONTROLLED - PT STATES HE IS NOT DIABETIC  . Lumbar spondylosis W/ RADICULOPATHY  . Iron deficiency   . Cataract immature LEFT EYE  . PAF (paroxysmal atrial fibrillation)   . SVT (supraventricular tachycardia)     S/P ABLATION   2008  . Coronary artery disease CARDIOLOGIST- DR Crissie Sickles    09-08-10 VISIT AND PACEMAKER CHECK  IN EPIC  . Cancer     HX OF PROSTATE CANCER--TX'D WITH RADIATION  . Sleep apnea TESTED YRS AGO-- NO CPAP RX GIVEN    PT STATES HE DOES NOT THINK HE STILL HAS SLEEP APNEA--DOES NOT USE CPAP  . Stroke Oct. 14, 2014    History   Social History  . Marital Status: Married    Spouse Name: N/A    Number of Children: N/A  . Years of Education: N/A   Occupational History  . retired    Social History Main Topics  . Smoking status: Former Smoker -- 0.10 packs/day for 55 years    Types: Cigarettes  Quit date: 12/07/1998  . Smokeless tobacco: Never Used  . Alcohol Use: Yes     Comment: RARE  . Drug Use: No  . Sexual Activity: Yes    Partners: Female   Other Topics Concern  . Not on file   Social History Narrative  . No narrative on file    Past Surgical History  Procedure Laterality Date  . Replacement total knee  1997    RIGHT  . Cardiac pacemaker placement  06-04-10    DDD/ AND REMOVAL LOOR RECORDER  . Loop recorder placement  01-30-10  . Lumbar laminectomy/ diskectomy/ fusion  05-19-10    L4 - 5  . Cholecystectomy  2009  . Cardiac electrophysiology study and ablation  2008    FOR SVT  . Prostate biopsy  2007    . Transurethral resection of prostate  2006  . Inguinal hernia repair  2005    LEFT/   DONE WITH PENILE PROSTESIS SURG.  . Removal and placement penile prosthesis implant   2005    AND LEFT CORPOROPLASTY /    DONE INGUINAL REPAIR  . Penile prosthesis implant  1995  . Knee arthroscopy  06/02/2011    Procedure: ARTHROSCOPY KNEE;  Surgeon: Gearlean Alf;  Location: [place];  Service: Orthopedics;  Laterality: Left;  LEFT KNEE ARTHROSCOPY WITH DEBRIDEMENT  . Total knee arthroplasty  12/20/2011    Procedure: TOTAL KNEE ARTHROPLASTY;  Surgeon: Gearlean Alf, MD;  Location: [facility];  Service: Orthopedics;  Laterality: Left;  . Back surgery    . Joint replacement    . Spine surgery      Family History  Problem Relation Age of Onset  . Stroke Mother   . Early death Father     Allergies  Allergen Reactions  . Ciprofloxacin Nausea Only    REACTION: GI upset  . Other Other (See Comments)    SCALLOPS--SUDDEN GI ATTACK  . Hydrocodone-Acetaminophen     Hallucinations in higher doses    Current Outpatient Prescriptions on File Prior to Visit  Medication Sig Dispense Refill  . atorvastatin (LIPITOR) 20 MG tablet Take 20 mg by mouth daily.      . citalopram (CELEXA) 20 MG tablet Take 1.5 tablets (30 mg total) by mouth daily.  45 tablet  2  . clotrimazole-betamethasone (LOTRISONE) cream Apply 1 application topically 2 (two) times daily as needed (psoriasis).      . Cyanocobalamin (VITAMIN B-12 IJ) Inject 3 mLs as directed every 14 (fourteen) days. Every two weeks      . ezetimibe (ZETIA) 10 MG tablet Take 1 tablet (10 mg total) by mouth daily.  90 tablet  3  . HYDROcodone-acetaminophen (NORCO/VICODIN) 5-325 MG per tablet Take 1-2 tablets by mouth every 4 (four) hours as needed for moderate pain or severe pain.  15 tablet  0  . ipratropium (ATROVENT) 0.06 % nasal spray Place 2 sprays into both nostrils 4 (four) times daily.  45 mL  3  . iron polysaccharides (NIFEREX) 150 MG  capsule Take 150 mg by mouth 2 (two) times daily.      Marland Kitchen lisinopril (PRINIVIL,ZESTRIL) 10 MG tablet Take 1 tablet (10 mg total) by mouth 2 (two) times daily.  180 tablet  3  . methotrexate (RHEUMATREX) 2.5 MG tablet Take 10 mg by mouth once a week. Caution:Chemotherapy. Protect from light. Every monday      . mirabegron ER (MYRBETRIQ) 50 MG TB24 tablet Take 1 tablet (50 mg total) by  mouth daily.  90 tablet  3  . pantoprazole (PROTONIX) 40 MG tablet Take 1 tablet (40 mg total) by mouth daily.  90 tablet  3  . POLY-IRON 150 150 MG capsule TAKE ONE CAPSULE BY MOUTH TWICE DAILY  180 capsule  1  . predniSONE (DELTASONE) 5 MG tablet Take 1.5 tablets (7.5 mg total) by mouth daily.  135 tablet  3  . Rivaroxaban (XARELTO) 20 MG TABS tablet Take 1 tablet (20 mg total) by mouth daily with supper. This is the new medication for stroke prevention  90 tablet  3  . rOPINIRole (REQUIP) 2 MG tablet Take 1 tablet (2 mg total) by mouth at bedtime.  90 tablet  3  . solifenacin (VESICARE) 10 MG tablet Take 1 tablet (10 mg total) by mouth daily. Use vesicare or ditropan--not both  90 tablet  3  . triamcinolone cream (KENALOG) 0.1 % Apply to affected area as directed  480 g  0   No current facility-administered medications on file prior to visit.    BP 120/60  Pulse 92  Ht 6\' 3"  (1.905 m)  Wt 220 lb 12.8 oz (100.154 kg)  BMI 27.60 kg/m2chart    Objective:   Physical Exam  Constitutional: He is oriented to person, place, and time. He appears well-developed and well-nourished.  HENT:  Head: Normocephalic.  Right Ear: External ear normal.  Left Ear: External ear normal.  Nose: Nose normal.  Mouth/Throat: Oropharynx is clear and moist.  Eyes: Conjunctivae and EOM are normal. Pupils are equal, round, and reactive to light.  Neck: Normal range of motion. Neck supple.  Cardiovascular: Normal rate, regular rhythm and normal heart sounds.   Pulmonary/Chest: Effort normal and breath sounds normal.  Abdominal:  Soft. Bowel sounds are normal.  Genitourinary: Penis normal.  Musculoskeletal: Normal range of motion.  Neurological: He is alert and oriented to person, place, and time.  Skin: Skin is warm and dry. Rash noted.  Healing rash  Psychiatric: He has a normal mood and affect.          Assessment & Plan:  Wilferd was seen today for transfer from [place].  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 Continue current medications except decrease lisinopril to 10 mg once daily to hopefully help with dizziness. Check blood pressure 2-3 times per week with a systolic goal of less than 145.

## 2014-04-12 NOTE — Addendum Note (Signed)
Addended byRoxy Cedar B on: 04/12/2014 10:32 AM   Modules accepted: Orders, Level of Service

## 2014-04-12 NOTE — Progress Notes (Signed)
Pre visit review using our clinic review tool, if applicable. No additional management support is needed unless otherwise documented below in the visit note. 

## 2014-04-12 NOTE — Patient Instructions (Signed)
Postherpetic Neuralgia Postherpetic neuralgia (PHN) is nerve pain that occurs after a shingles infection. Shingles is a painful rash that appears on one side of the body, usually on your trunk or face. Shingles is caused by the varicella-zoster virus. This is the same virus that causes chickenpox. In people who have had chickenpox, the virus can resurface years later and cause shingles. You may have PHN if you continue to have pain for 3 months after your shingles rash has gone away. PHN appears in the same area where you had the shingles rash. For most people, PHN goes away within 1 year.  Getting a vaccination for shingles can prevent PHN. This vaccine is recommended for people older than 50. It may prevent shingles and may also lower your risk of PHN if you do get shingles. CAUSES PHN is caused by damage to your nerves from the varicella-zoster virus. This damage makes your nerves overly sensitive.  RISK FACTORS Aging is the biggest risk factor for developing PHN. Most people who get PHN are older than 60. Other risk factors include:  Having very bad pain before your shingles rash starts.  Having a very bad rash.  Having shingles in the nerve that supplies your face and eye (trigeminal nerve). SIGNS AND SYMPTOMS Pain is the main symptom of PHN. The pain is often very bad and may be described as stabbing, burning, or feeling like an electric shock. The pain may come and go or may be there all the time. Pain may be triggered by light touches on the skin or changes in temperature. You may have itching along with the pain. DIAGNOSIS  Your health care provider may diagnose PHN based on your symptoms and your history of shingles. Lab studies and other diagnostic tests are usually not needed. TREATMENT  There is no cure for PHN. Treatment for PHN will focus on pain relief. Over-the-counter pain relievers do not usually relieve PHN pain. You may need to work with a pain specialist. Treatment may  include:  Antidepressant medicines to help with pain and improve sleep.  Antiseizure medicines to relieve nerve pain.  Strong pain relievers (opioids).  A numbing patch worn on the skin (lidocaine patch). HOME CARE INSTRUCTIONS It may take a long time to recover from PHN. Work closely with your health care provider, and have a good support system at home.   Take all medicines as directed by your health care provider.  Wear loose, comfortable clothing.  Cover sensitive areas with a dressing to reduce friction from clothing rubbing on the area.  If cold does not make your pain worse, try applying a cool compress or cooling gel pack to the area.  Talk to your health care provider if you feel depressed or desperate. Living with long-term pain can be depressing. SEEK MEDICAL CARE IF:  Your medicine is not helping.  You are struggling to manage your pain at home. Document Released: 08/28/2002 Document Revised: 10/22/2013 Document Reviewed: 05/29/2013 Exit[employer] Patient Information 2015 [employer]. This information is not intended to replace advice given to you by your health care provider. Make sure you discuss any questions you have with your health care provider.  

## 2014-04-14 ENCOUNTER — Encounter: Payer: Self-pay | Admitting: Pulmonary Disease

## 2014-04-15 ENCOUNTER — Telehealth: Payer: Self-pay | Admitting: Family

## 2014-04-15 NOTE — Telephone Encounter (Signed)
emmi emailed °

## 2014-04-17 ENCOUNTER — Encounter: Payer: Self-pay | Admitting: Physical Medicine & Rehabilitation

## 2014-04-18 ENCOUNTER — Encounter: Payer: Self-pay | Admitting: Physical Medicine & Rehabilitation

## 2014-04-29 ENCOUNTER — Encounter: Payer: Self-pay | Admitting: Pulmonary Disease

## 2014-04-29 ENCOUNTER — Ambulatory Visit (INDEPENDENT_AMBULATORY_CARE_PROVIDER_SITE_OTHER): Payer: Commercial Managed Care - HMO | Admitting: Pulmonary Disease

## 2014-04-29 VITALS — BP 122/66 | HR 82 | Temp 97.3°F | Ht 75.0 in | Wt 224.4 lb

## 2014-04-29 DIAGNOSIS — G4733 Obstructive sleep apnea (adult) (pediatric): Secondary | ICD-10-CM

## 2014-04-29 NOTE — Patient Instructions (Signed)
Will have your pressure changed over to the auto mode so that it can self regulate during the night. Let me know if you continue to have issues with keeping the mask on during the night. followup with me again in 48mos.

## 2014-04-29 NOTE — Progress Notes (Signed)
   Subjective:    Patient ID: Christopher Baker, male    DOB: 1933/09/05, 80 y.o.   MRN: [id number]  HPI Patient comes in today for follow-up of his known obstructive sleep apnea. He is wearing C Pap compliantly by his download, but is having issues with pulling the mask off during the night. His download shows breakthrough events despite his pressure setting, and he will obviously need increased pressure. He is having no issues with mask fit, although he does need to turn down his heated humidifier. Both he and his wife have seen significant improvement in his sleep and daytime alertness.   Review of Systems  Constitutional: Negative for fever and unexpected weight change.  HENT: Negative for congestion, dental problem, ear pain, nosebleeds, postnasal drip, rhinorrhea, sinus pressure, sneezing, sore throat and trouble swallowing.   Eyes: Negative for redness and itching.  Respiratory: Negative for cough, chest tightness, shortness of breath and wheezing.   Cardiovascular: Negative for palpitations and leg swelling.  Gastrointestinal: Negative for nausea and vomiting.  Genitourinary: Negative for dysuria.  Musculoskeletal: Negative for joint swelling.  Skin: Negative for rash.  Neurological: Negative for headaches.  Hematological: Does not bruise/bleed easily.  Psychiatric/Behavioral: Negative for dysphoric mood. The patient is not nervous/anxious.        Objective:   Physical Exam Well-developed male in no acute distress Nose without purulence or discharge noted Neck without lymphadenopathy or thyromegaly No skin breakdown or pressure necrosis from the C Pap mask Lower extremities without edema, no cyanosis Alert and oriented, moves all 4 extremities.       Assessment & Plan:

## 2014-04-29 NOTE — Assessment & Plan Note (Signed)
The patient overall has done fairly well with C Pap since starting on therapy, but is having issues with pulling off his mask during the night. I suspect this is related to inadequate pressure with breakthrough events, but I also went through the desensitization process with him as well.  Will have his machine set on the auto mode to better treat his apnea, and hopefully this will take care of his mask issue. He is to let me know if this does not help.

## 2014-05-06 ENCOUNTER — Encounter: Payer: Self-pay | Admitting: Physical Medicine & Rehabilitation

## 2014-05-06 ENCOUNTER — Encounter: Payer: Self-pay | Admitting: Family

## 2014-05-09 ENCOUNTER — Ambulatory Visit: Payer: Commercial Managed Care - HMO | Admitting: Family Medicine

## 2014-05-14 ENCOUNTER — Telehealth: Payer: Self-pay | Admitting: Family

## 2014-05-14 MED ORDER — CITALOPRAM HYDROBROMIDE 20 MG PO TABS: 30.0000 mg | ORAL_TABLET | Freq: Every day | ORAL | Status: DC

## 2014-05-14 NOTE — Telephone Encounter (Signed)
Done

## 2014-05-14 NOTE — Telephone Encounter (Signed)
[facility], [place] [phone] is requesting re-fill on citalopram (CELEXA) 20 MG tablet

## 2014-05-21 ENCOUNTER — Telehealth: Payer: Self-pay | Admitting: Neurology

## 2014-05-21 NOTE — Telephone Encounter (Signed)
Pt resch appt from 07-04-13 to 07-09-13

## 2014-06-17 ENCOUNTER — Other Ambulatory Visit: Payer: Self-pay | Admitting: *Deleted

## 2014-06-17 ENCOUNTER — Encounter (HOSPITAL_COMMUNITY): Payer: Self-pay | Admitting: Emergency Medicine

## 2014-06-17 ENCOUNTER — Telehealth: Payer: Self-pay

## 2014-06-17 DIAGNOSIS — E538 Deficiency of other specified B group vitamins: Secondary | ICD-10-CM | POA: Diagnosis present

## 2014-06-17 DIAGNOSIS — Z8673 Personal history of transient ischemic attack (TIA), and cerebral infarction without residual deficits: Secondary | ICD-10-CM

## 2014-06-17 DIAGNOSIS — Z96651 Presence of right artificial knee joint: Secondary | ICD-10-CM | POA: Diagnosis present

## 2014-06-17 DIAGNOSIS — Z7952 Long term (current) use of systemic steroids: Secondary | ICD-10-CM

## 2014-06-17 DIAGNOSIS — K219 Gastro-esophageal reflux disease without esophagitis: Secondary | ICD-10-CM | POA: Diagnosis present

## 2014-06-17 DIAGNOSIS — I480 Paroxysmal atrial fibrillation: Secondary | ICD-10-CM | POA: Diagnosis present

## 2014-06-17 DIAGNOSIS — Z87891 Personal history of nicotine dependence: Secondary | ICD-10-CM

## 2014-06-17 DIAGNOSIS — I10 Essential (primary) hypertension: Secondary | ICD-10-CM | POA: Diagnosis present

## 2014-06-17 DIAGNOSIS — G4089 Other seizures: Principal | ICD-10-CM | POA: Diagnosis present

## 2014-06-17 DIAGNOSIS — I959 Hypotension, unspecified: Secondary | ICD-10-CM | POA: Diagnosis present

## 2014-06-17 DIAGNOSIS — G2581 Restless legs syndrome: Secondary | ICD-10-CM | POA: Diagnosis present

## 2014-06-17 DIAGNOSIS — I429 Cardiomyopathy, unspecified: Secondary | ICD-10-CM | POA: Diagnosis present

## 2014-06-17 DIAGNOSIS — L4050 Arthropathic psoriasis, unspecified: Secondary | ICD-10-CM | POA: Diagnosis present

## 2014-06-17 DIAGNOSIS — Z881 Allergy status to other antibiotic agents status: Secondary | ICD-10-CM

## 2014-06-17 DIAGNOSIS — H269 Unspecified cataract: Secondary | ICD-10-CM | POA: Diagnosis present

## 2014-06-17 DIAGNOSIS — I739 Peripheral vascular disease, unspecified: Secondary | ICD-10-CM | POA: Diagnosis present

## 2014-06-17 DIAGNOSIS — Z9049 Acquired absence of other specified parts of digestive tract: Secondary | ICD-10-CM | POA: Diagnosis present

## 2014-06-17 DIAGNOSIS — Z923 Personal history of irradiation: Secondary | ICD-10-CM

## 2014-06-17 DIAGNOSIS — M479 Spondylosis, unspecified: Secondary | ICD-10-CM | POA: Diagnosis present

## 2014-06-17 DIAGNOSIS — M1990 Unspecified osteoarthritis, unspecified site: Secondary | ICD-10-CM | POA: Diagnosis present

## 2014-06-17 DIAGNOSIS — Z950 Presence of cardiac pacemaker: Secondary | ICD-10-CM

## 2014-06-17 DIAGNOSIS — R4182 Altered mental status, unspecified: Secondary | ICD-10-CM | POA: Diagnosis not present

## 2014-06-17 DIAGNOSIS — I2510 Atherosclerotic heart disease of native coronary artery without angina pectoris: Secondary | ICD-10-CM | POA: Diagnosis present

## 2014-06-17 DIAGNOSIS — E785 Hyperlipidemia, unspecified: Secondary | ICD-10-CM | POA: Diagnosis present

## 2014-06-17 DIAGNOSIS — Z8546 Personal history of malignant neoplasm of prostate: Secondary | ICD-10-CM

## 2014-06-17 DIAGNOSIS — G4730 Sleep apnea, unspecified: Secondary | ICD-10-CM | POA: Diagnosis present

## 2014-06-17 MED ORDER — PREDNISONE 5 MG PO TABS: 7.5000 mg | ORAL_TABLET | Freq: Every day | ORAL | Status: DC

## 2014-06-17 NOTE — ED Notes (Signed)
Spouse reported that pt. had an episode of confusion this evening with brief facial asymmetry , alert and oriented at arrival , speech clear , no facial asymmetry , no arm drift/equal strong grips and ambulatory.

## 2014-06-17 NOTE — Telephone Encounter (Signed)
Refill request for Prednisone 5mg  tab #135 sent to [facility]

## 2014-06-17 NOTE — Telephone Encounter (Signed)
Refill done by [facility] 06/17/14

## 2014-06-18 ENCOUNTER — Emergency Department (HOSPITAL_COMMUNITY): Payer: Medicare HMO

## 2014-06-18 ENCOUNTER — Encounter (HOSPITAL_COMMUNITY): Payer: Self-pay | Admitting: Emergency Medicine

## 2014-06-18 ENCOUNTER — Inpatient Hospital Stay (HOSPITAL_COMMUNITY)
Admission: EM | Admit: 2014-06-18 | Discharge: 2014-06-20 | DRG: 101 | Disposition: A | Discharge status: Home / Self Care | Payer: Medicare HMO | Attending: Internal Medicine | Admitting: Internal Medicine

## 2014-06-18 DIAGNOSIS — I739 Peripheral vascular disease, unspecified: Secondary | ICD-10-CM | POA: Diagnosis present

## 2014-06-18 DIAGNOSIS — Z79631 Long term (current) use of antimetabolite agent: Secondary | ICD-10-CM

## 2014-06-18 DIAGNOSIS — Z87891 Personal history of nicotine dependence: Secondary | ICD-10-CM | POA: Diagnosis not present

## 2014-06-18 DIAGNOSIS — I480 Paroxysmal atrial fibrillation: Secondary | ICD-10-CM | POA: Diagnosis present

## 2014-06-18 DIAGNOSIS — Z950 Presence of cardiac pacemaker: Secondary | ICD-10-CM

## 2014-06-18 DIAGNOSIS — K219 Gastro-esophageal reflux disease without esophagitis: Secondary | ICD-10-CM | POA: Diagnosis present

## 2014-06-18 DIAGNOSIS — Z8673 Personal history of transient ischemic attack (TIA), and cerebral infarction without residual deficits: Secondary | ICD-10-CM | POA: Diagnosis not present

## 2014-06-18 DIAGNOSIS — L4050 Arthropathic psoriasis, unspecified: Secondary | ICD-10-CM | POA: Diagnosis present

## 2014-06-18 DIAGNOSIS — I2510 Atherosclerotic heart disease of native coronary artery without angina pectoris: Secondary | ICD-10-CM | POA: Diagnosis present

## 2014-06-18 DIAGNOSIS — G2581 Restless legs syndrome: Secondary | ICD-10-CM | POA: Diagnosis present

## 2014-06-18 DIAGNOSIS — I10 Essential (primary) hypertension: Secondary | ICD-10-CM

## 2014-06-18 DIAGNOSIS — R410 Disorientation, unspecified: Secondary | ICD-10-CM

## 2014-06-18 DIAGNOSIS — Z923 Personal history of irradiation: Secondary | ICD-10-CM | POA: Diagnosis not present

## 2014-06-18 DIAGNOSIS — I482 Chronic atrial fibrillation, unspecified: Secondary | ICD-10-CM | POA: Diagnosis present

## 2014-06-18 DIAGNOSIS — Z79899 Other long term (current) drug therapy: Secondary | ICD-10-CM

## 2014-06-18 DIAGNOSIS — G459 Transient cerebral ischemic attack, unspecified: Secondary | ICD-10-CM | POA: Diagnosis present

## 2014-06-18 DIAGNOSIS — M1990 Unspecified osteoarthritis, unspecified site: Secondary | ICD-10-CM | POA: Diagnosis present

## 2014-06-18 DIAGNOSIS — G4730 Sleep apnea, unspecified: Secondary | ICD-10-CM | POA: Diagnosis present

## 2014-06-18 DIAGNOSIS — I4891 Unspecified atrial fibrillation: Secondary | ICD-10-CM

## 2014-06-18 DIAGNOSIS — E538 Deficiency of other specified B group vitamins: Secondary | ICD-10-CM | POA: Diagnosis present

## 2014-06-18 DIAGNOSIS — R569 Unspecified convulsions: Secondary | ICD-10-CM

## 2014-06-18 DIAGNOSIS — R609 Edema, unspecified: Secondary | ICD-10-CM

## 2014-06-18 DIAGNOSIS — I429 Cardiomyopathy, unspecified: Secondary | ICD-10-CM | POA: Diagnosis present

## 2014-06-18 DIAGNOSIS — G4089 Other seizures: Secondary | ICD-10-CM | POA: Diagnosis present

## 2014-06-18 DIAGNOSIS — R4182 Altered mental status, unspecified: Secondary | ICD-10-CM | POA: Diagnosis present

## 2014-06-18 DIAGNOSIS — Z9049 Acquired absence of other specified parts of digestive tract: Secondary | ICD-10-CM | POA: Diagnosis present

## 2014-06-18 DIAGNOSIS — M479 Spondylosis, unspecified: Secondary | ICD-10-CM | POA: Diagnosis present

## 2014-06-18 DIAGNOSIS — I959 Hypotension, unspecified: Secondary | ICD-10-CM | POA: Diagnosis present

## 2014-06-18 DIAGNOSIS — H269 Unspecified cataract: Secondary | ICD-10-CM | POA: Diagnosis present

## 2014-06-18 DIAGNOSIS — Z96651 Presence of right artificial knee joint: Secondary | ICD-10-CM | POA: Diagnosis present

## 2014-06-18 DIAGNOSIS — Z7952 Long term (current) use of systemic steroids: Secondary | ICD-10-CM | POA: Diagnosis not present

## 2014-06-18 DIAGNOSIS — Z881 Allergy status to other antibiotic agents status: Secondary | ICD-10-CM | POA: Diagnosis not present

## 2014-06-18 DIAGNOSIS — E785 Hyperlipidemia, unspecified: Secondary | ICD-10-CM | POA: Diagnosis present

## 2014-06-18 DIAGNOSIS — Z8546 Personal history of malignant neoplasm of prostate: Secondary | ICD-10-CM | POA: Diagnosis not present

## 2014-06-18 DIAGNOSIS — I359 Nonrheumatic aortic valve disorder, unspecified: Secondary | ICD-10-CM

## 2014-06-18 LAB — COMPREHENSIVE METABOLIC PANEL
ALT: 24 U/L (ref 0–53)
AST: 22 U/L (ref 0–37)
Albumin: 3.2 g/dL — ABNORMAL LOW (ref 3.5–5.2)
Alkaline Phosphatase: 68 U/L (ref 39–117)
Anion gap: 6 (ref 5–15)
BUN: 19 mg/dL (ref 6–23)
CALCIUM: 8.9 mg/dL (ref 8.4–10.5)
CO2: 27 mmol/L (ref 19–32)
Chloride: 110 mEq/L (ref 96–112)
Creatinine, Ser: 1.07 mg/dL (ref 0.50–1.35)
GFR calc Af Amer: 75 mL/min — ABNORMAL LOW (ref >90)
GFR calc non Af Amer: 64 mL/min — ABNORMAL LOW (ref >90)
Glucose, Bld: 134 mg/dL — ABNORMAL HIGH (ref 70–99)
Potassium: 4.4 mmol/L (ref 3.5–5.1)
SODIUM: 143 mmol/L (ref 135–145)
TOTAL PROTEIN: 5.5 g/dL — AB (ref 6.0–8.3)
Total Bilirubin: 0.3 mg/dL (ref 0.3–1.2)

## 2014-06-18 LAB — GLUCOSE, CAPILLARY
GLUCOSE-CAPILLARY: 130 mg/dL — AB (ref 70–99)
Glucose-Capillary: 81 mg/dL (ref 70–99)
Glucose-Capillary: 107 mg/dL — ABNORMAL HIGH (ref 70–99)

## 2014-06-18 LAB — HEMOGLOBIN A1C
Hgb A1c MFr Bld: 6.1 % — ABNORMAL HIGH (ref <5.7)
MEAN PLASMA GLUCOSE: 128 mg/dL — AB (ref <117)

## 2014-06-18 LAB — I-STAT TROPONIN, ED: Troponin i, poc: 0.01 ng/mL (ref 0.00–0.08)

## 2014-06-18 LAB — URINALYSIS, ROUTINE W REFLEX MICROSCOPIC
BILIRUBIN URINE: NEGATIVE
Glucose, UA: NEGATIVE mg/dL
Ketones, ur: NEGATIVE mg/dL
LEUKOCYTES UA: NEGATIVE
NITRITE: NEGATIVE
PH: 5.5 (ref 5.0–8.0)
Protein, ur: NEGATIVE mg/dL
Specific Gravity, Urine: 1.015 (ref 1.005–1.030)
Urobilinogen, UA: 0.2 mg/dL (ref 0.0–1.0)

## 2014-06-18 LAB — CBC WITH DIFFERENTIAL/PLATELET
Basophils Absolute: 0 10*3/uL (ref 0.0–0.1)
Basophils Relative: 0 % (ref 0–1)
EOS ABS: 0.1 10*3/uL (ref 0.0–0.7)
Eosinophils Relative: 1 % (ref 0–5)
HCT: 36 % — ABNORMAL LOW (ref 39.0–52.0)
Hemoglobin: 11.1 g/dL — ABNORMAL LOW (ref 13.0–17.0)
LYMPHS ABS: 0.8 10*3/uL (ref 0.7–4.0)
LYMPHS PCT: 10 % — AB (ref 12–46)
MCH: 27.1 pg (ref 26.0–34.0)
MCHC: 30.8 g/dL (ref 30.0–36.0)
MCV: 87.8 fL (ref 78.0–100.0)
Monocytes Absolute: 1 10*3/uL (ref 0.1–1.0)
Monocytes Relative: 13 % — ABNORMAL HIGH (ref 3–12)
Neutro Abs: 5.7 10*3/uL (ref 1.7–7.7)
Neutrophils Relative %: 76 % (ref 43–77)
Platelets: 183 10*3/uL (ref 150–400)
RBC: 4.1 MIL/uL — AB (ref 4.22–5.81)
RDW: 16 % — ABNORMAL HIGH (ref 11.5–15.5)
WBC: 7.6 10*3/uL (ref 4.0–10.5)

## 2014-06-18 LAB — URINE MICROSCOPIC-ADD ON

## 2014-06-18 LAB — RAPID URINE DRUG SCREEN, HOSP PERFORMED
AMPHETAMINES: NOT DETECTED
Barbiturates: NOT DETECTED
Benzodiazepines: NOT DETECTED
Cocaine: NOT DETECTED
Opiates: NOT DETECTED
Tetrahydrocannabinol: NOT DETECTED

## 2014-06-18 MED ORDER — RIVAROXABAN 20 MG PO TABS
20.0000 mg | ORAL_TABLET | Freq: Every day | ORAL | Status: DC
  Administered 2014-06-18 – 2014-06-19 (×2): 20 mg via ORAL
  Filled 2014-06-18 (×2): qty 1

## 2014-06-18 MED ORDER — STROKE: EARLY STAGES OF RECOVERY BOOK
Freq: Once | Status: AC
  Administered 2014-06-18: 10:00:00
  Filled 2014-06-18: qty 1

## 2014-06-18 MED ORDER — GABAPENTIN 300 MG PO CAPS
300.0000 mg | ORAL_CAPSULE | Freq: Every day | ORAL | Status: DC
  Administered 2014-06-18 – 2014-06-19 (×2): 300 mg via ORAL
  Filled 2014-06-18 (×2): qty 1

## 2014-06-18 MED ORDER — EZETIMIBE 10 MG PO TABS
10.0000 mg | ORAL_TABLET | Freq: Every day | ORAL | Status: DC
  Administered 2014-06-18 – 2014-06-20 (×3): 10 mg via ORAL
  Filled 2014-06-18 (×3): qty 1

## 2014-06-18 MED ORDER — CYANOCOBALAMIN 1000 MCG/ML IJ SOLN
1000.0000 ug | Freq: Once | INTRAMUSCULAR | Status: AC
  Administered 2014-06-18: 1000 ug via SUBCUTANEOUS
  Filled 2014-06-18: qty 1

## 2014-06-18 MED ORDER — PREDNISONE 5 MG PO TABS
7.5000 mg | ORAL_TABLET | Freq: Every day | ORAL | Status: DC
  Administered 2014-06-18 – 2014-06-20 (×3): 7.5 mg via ORAL
  Filled 2014-06-18 (×2): qty 1
  Filled 2014-06-18 (×2): qty 2

## 2014-06-18 MED ORDER — HYDRALAZINE HCL 20 MG/ML IJ SOLN: 10.0000 mg | Freq: Three times a day (TID) | INTRAMUSCULAR | Status: DC | PRN

## 2014-06-18 MED ORDER — POLYSACCHARIDE IRON COMPLEX 150 MG PO CAPS
150.0000 mg | ORAL_CAPSULE | Freq: Two times a day (BID) | ORAL | Status: DC
  Administered 2014-06-18 – 2014-06-20 (×5): 150 mg via ORAL
  Filled 2014-06-18 (×7): qty 1

## 2014-06-18 MED ORDER — ENOXAPARIN SODIUM 40 MG/0.4ML ~~LOC~~ SOLN: 40.0000 mg | SUBCUTANEOUS | Status: DC

## 2014-06-18 MED ORDER — ROPINIROLE HCL 1 MG PO TABS
2.0000 mg | ORAL_TABLET | Freq: Every day | ORAL | Status: DC
  Administered 2014-06-18 – 2014-06-19 (×2): 2 mg via ORAL
  Filled 2014-06-18 (×3): qty 2

## 2014-06-18 MED ORDER — LEVETIRACETAM IN NACL 500 MG/100ML IV SOLN
500.0000 mg | Freq: Two times a day (BID) | INTRAVENOUS | Status: DC
  Administered 2014-06-18 – 2014-06-20 (×5): 500 mg via INTRAVENOUS
  Filled 2014-06-18 (×7): qty 100

## 2014-06-18 MED ORDER — DARIFENACIN HYDROBROMIDE ER 15 MG PO TB24
15.0000 mg | ORAL_TABLET | Freq: Every day | ORAL | Status: DC
Start: 2014-06-18 — End: 2014-06-20
  Administered 2014-06-18 – 2014-06-20 (×3): 15 mg via ORAL
  Filled 2014-06-18 (×3): qty 1

## 2014-06-18 MED ORDER — MIRABEGRON ER 50 MG PO TB24
50.0000 mg | ORAL_TABLET | Freq: Every day | ORAL | Status: DC
  Administered 2014-06-18 – 2014-06-20 (×3): 50 mg via ORAL
  Filled 2014-06-18 (×3): qty 1

## 2014-06-18 MED ORDER — PANTOPRAZOLE SODIUM 40 MG PO TBEC
40.0000 mg | DELAYED_RELEASE_TABLET | Freq: Every day | ORAL | Status: DC
  Administered 2014-06-18 – 2014-06-20 (×3): 40 mg via ORAL
  Filled 2014-06-18 (×3): qty 1

## 2014-06-18 NOTE — Progress Notes (Addendum)
VASCULAR LAB PRELIMINARY  PRELIMINARY  PRELIMINARY  PRELIMINARY  Carotid duplex  completed.    Preliminary report:  Bilateral:  40-59% internal carotid artery stenosis.   Bilateral:  Vertebral artery flow is antegrade.    Tattiana Fakhouri, RVT 06/18/2014, 2:06 PM

## 2014-06-18 NOTE — H&P (Signed)
[facility] History and Physical  Patient: Christopher Baker  WUX:[id number]  DOB: 11/14/1933  DOS: the patient was seen and examined on 06/18/2014 PCP: CAMPBELL, Hillard Danker, FNP  Chief Complaint: acute change in mental status  HPI: Christopher Baker is a 80 y.o. male with Past medical history of hypertension, PVD, dyslipidemia, PPM implant for syncope, PAF . The pt has been presented with left-sided weakness. History was obtained from patient's wife. Today when they were having a dinner outside the patient started having difficulty remembering his regular order as well as today's date. He also started having left-sided droopiness when the wife asked him to smile. But he was still able to stick out his tongue straight. After reaching home he started having difficulty laying a simple games that he has been playing for many years. He was unable to recall the date and month. Apparently after that the patient become lethargic and slumped over and fell asleep. He continues to have a left facial droop. The speech was not slurred and there was no left-sided weakness. At the time of his evaluation patient symptoms completely resolved as per wife. 66 ago patient was taken off his blood pressure medication. Patient was initially on amlodipine and lisinopril, initially amlodipine and later on lisinopril was taken off off his list due to hypotension. Today after reaching home wife found the patient's blood pressure was significantly elevated 170/70.  The patient is coming from home. And at his baseline independent for most of his ADL.  Review of Systems: as mentioned in the history of present illness.  A Comprehensive review of the other systems is negative.  Past Medical History  Diagnosis Date  . Hyperlipidemia   . HTN (hypertension)   . Status post placement of cardiac pacemaker DDD-  06-04-10-- LAST CHECK 09-08-10 IN EPIC    SECONDARY TO SYNCOPY AND BRADYCARDIA  . Restless leg syndrome   .  Normal nuclear stress test 2008    LOW RISK--   DR  WALL  . Carotid stenosis, bilateral PER DR EARLY / DUPLEX  09-09-10      40 - 59%   BILATERALLY--- ASYMPTOMATIC  . History of echocardiogram 11-21-2006    EF 65%  . History of prostate cancer S/P RADIATION  TX  6 YRS AGO  . Psoriasis   . Arthritis with psoriasis   . GERD (gastroesophageal reflux disease)     CONTROLLED W/ PROTONIX  . Borderline diabetes     DIET CONTROLLED - PT STATES HE IS NOT DIABETIC  . Lumbar spondylosis W/ RADICULOPATHY  . Iron deficiency   . Cataract immature LEFT EYE  . PAF (paroxysmal atrial fibrillation)   . SVT (supraventricular tachycardia)     S/P ABLATION   2008  . Coronary artery disease CARDIOLOGIST- DR Crissie Sickles    09-08-10 VISIT AND PACEMAKER CHECK  IN EPIC  . Cancer     HX OF PROSTATE CANCER--TX'D WITH RADIATION  . Sleep apnea TESTED YRS AGO-- NO CPAP RX GIVEN    PT STATES HE DOES NOT THINK HE STILL HAS SLEEP APNEA--DOES NOT USE CPAP  . Stroke Oct. 14, 2014   Past Surgical History  Procedure Laterality Date  . Replacement total knee  1997    RIGHT  . Cardiac pacemaker placement  06-04-10    DDD/ AND REMOVAL LOOR RECORDER  . Loop recorder placement  01-30-10  . Lumbar laminectomy/ diskectomy/ fusion  05-19-10    L4 - 5  . Cholecystectomy  2009  .  Cardiac electrophysiology study and ablation  2008    FOR SVT  . Prostate biopsy  2007  . Transurethral resection of prostate  2006  . Inguinal hernia repair  2005    LEFT/   DONE WITH PENILE PROSTESIS SURG.  . Removal and placement penile prosthesis implant   2005    AND LEFT CORPOROPLASTY /    DONE INGUINAL REPAIR  . Penile prosthesis implant  1995  . Knee arthroscopy  06/02/2011    Procedure: ARTHROSCOPY KNEE;  Surgeon: Gearlean Alf;  Location: [place];  Service: Orthopedics;  Laterality: Left;  LEFT KNEE ARTHROSCOPY WITH DEBRIDEMENT  . Total knee arthroplasty  12/20/2011    Procedure: TOTAL KNEE ARTHROPLASTY;   Surgeon: Gearlean Alf, MD;  Location: [facility];  Service: Orthopedics;  Laterality: Left;  . Back surgery    . Joint replacement    . Spine surgery     Social History:  reports that he quit smoking about 15 years ago. His smoking use included Cigarettes. He has a 5.5 pack-year smoking history. He has never used smokeless tobacco. He reports that he drinks alcohol. He reports that he does not use illicit drugs.  Allergies  Allergen Reactions  . Ciprofloxacin Nausea Only    REACTION: GI upset  . Other Other (See Comments)    SCALLOPS--SUDDEN GI ATTACK  . Hydrocodone-Acetaminophen     Hallucinations in higher doses    Family History  Problem Relation Age of Onset  . Stroke Mother   . Early death Father     Prior to Admission medications   Medication Sig Start Date End Date Taking? Authorizing Provider  citalopram (CELEXA) 20 MG tablet Take 1.5 tablets (30 mg total) by mouth daily. 05/14/14  Yes Timoteo Gaul, FNP  clotrimazole-betamethasone (LOTRISONE) cream Apply 1 application topically 2 (two) times daily as needed (psoriasis).   Yes Historical Provider, MD  Cyanocobalamin (VITAMIN B-12 IJ) Inject 3 mLs as directed every 14 (fourteen) days. Every two weeks   Yes Historical Provider, MD  ezetimibe (ZETIA) 10 MG tablet Take 1 tablet (10 mg total) by mouth daily. 08/22/13  Yes Ricard Dillon, MD  gabapentin (NEURONTIN) 300 MG capsule Take 300 mg by mouth at bedtime.   Yes Historical Provider, MD  ipratropium (ATROVENT) 0.06 % nasal spray Place 2 sprays into both nostrils 4 (four) times daily. Patient taking differently: Place 2 sprays into both nostrils 4 (four) times daily as needed for rhinitis.  08/22/13  Yes Ricard Dillon, MD  iron polysaccharides (NIFEREX) 150 MG capsule Take 150 mg by mouth 2 (two) times daily.   Yes Historical Provider, MD  methotrexate (RHEUMATREX) 2.5 MG tablet Take 10 mg by mouth once a week. Caution:Chemotherapy. Protect from light. Every monday   Yes  Historical Provider, MD  mirabegron ER (MYRBETRIQ) 50 MG TB24 tablet Take 1 tablet (50 mg total) by mouth daily. 06/29/13  Yes Ricard Dillon, MD  pantoprazole (PROTONIX) 40 MG tablet Take 1 tablet (40 mg total) by mouth daily. 08/22/13  Yes Ricard Dillon, MD  predniSONE (DELTASONE) 5 MG tablet Take 1.5 tablets (7.5 mg total) by mouth daily. 06/17/14  Yes Timoteo Gaul, FNP  Rivaroxaban (XARELTO) 20 MG TABS tablet Take 1 tablet (20 mg total) by mouth daily with supper. This is the new medication for stroke prevention 06/29/13  Yes Ricard Dillon, MD  rOPINIRole (REQUIP) 2 MG tablet Take 1 tablet (2 mg total) by mouth at bedtime.  01/14/14  Yes Timoteo Gaul, FNP  solifenacin (VESICARE) 10 MG tablet Take 1 tablet (10 mg total) by mouth daily. Use vesicare or ditropan--not both 06/29/13  Yes Ricard Dillon, MD  triamcinolone cream (KENALOG) 0.1 % Apply to affected area as directed Patient taking differently: Apply 1 application topically daily as needed (rash). Apply to affected area as directed 04/03/14  Yes Timoteo Gaul, FNP  atorvastatin (LIPITOR) 20 MG tablet Take 20 mg by mouth daily.    Historical Provider, MD  lisinopril (PRINIVIL,ZESTRIL) 10 MG tablet Take 1 tablet (10 mg total) by mouth 2 (two) times daily. Patient not taking: Reported on 06/18/2014 08/22/13   Ricard Dillon, MD    Physical Exam: Filed Vitals:   06/18/14 0415 06/18/14 0430 06/18/14 0445 06/18/14 0627  BP: 167/82 170/84 172/92 184/72  Pulse: 69 66 71 64  Temp:      TempSrc:      Resp: 19 15 20 14   SpO2: 98% 97% 97% 98%    General: Alert, Awake and Oriented to Time, Place and Person. Appear in mild distress Eyes: PERRL ENT: Oral Mucosa clear moist. Neck: no JVD Cardiovascular: S1 and S2 Present, aortic systolic Murmur, Peripheral Pulses Present Respiratory: Bilateral Air entry equal and Decreased, basal Crackles, no wheezes Abdomen: Bowel Sound present, Soft and non tender Skin: no Rash Extremities:  bilateral Pedal edema, non calf tenderness Neurologic: Grossly no focal neuro deficit.  Labs on Admission:  CBC:  Recent Labs Lab 06/18/14 0011  WBC 7.6  NEUTROABS 5.7  HGB 11.1*  HCT 36.0*  MCV 87.8  PLT 183    CMP     Component Value Date/Time   NA 143 06/18/2014 0011   K 4.4 06/18/2014 0011   CL 110 06/18/2014 0011   CO2 27 06/18/2014 0011   GLUCOSE 134* 06/18/2014 0011   BUN 19 06/18/2014 0011   CREATININE 1.07 06/18/2014 0011   CALCIUM 8.9 06/18/2014 0011   PROT 5.5* 06/18/2014 0011   ALBUMIN 3.2* 06/18/2014 0011   AST 22 06/18/2014 0011   ALT 24 06/18/2014 0011   ALKPHOS 68 06/18/2014 0011   BILITOT 0.3 06/18/2014 0011   GFRNONAA 64* 06/18/2014 0011   GFRAA 75* 06/18/2014 0011    No results for input(s): LIPASE, AMYLASE in the last 168 hours. No results for input(s): AMMONIA in the last 168 hours.  No results for input(s): CKTOTAL, CKMB, CKMBINDEX, TROPONINI in the last 168 hours. BNP (last 3 results) No results for input(s): PROBNP in the last 8760 hours.  Radiological Exams on Admission: Dg Chest 2 View  06/18/2014   CLINICAL DATA:  Confusion.  EXAM: CHEST  2 VIEW  COMPARISON:  03/02/2014  FINDINGS: There is a dual-chamber pacer from the left; lead orientation is unchanged. Normal heart size and mediastinal contours. No acute infiltrate or edema. No effusion or pneumothorax. No acute osseous findings.  IMPRESSION: No active cardiopulmonary disease.   Electronically Signed   By: Jorje Guild M.D.   On: 06/18/2014 04:09   Ct Head (brain) Wo Contrast  06/18/2014   CLINICAL DATA:  Left-sided facial droop with subsequent resolution. Altered mental status.  EXAM: CT HEAD WITHOUT CONTRAST  TECHNIQUE: Contiguous axial images were obtained from the base of the skull through the vertex without intravenous contrast.  COMPARISON:  12/31/2013  FINDINGS: Skull and Sinuses:No acute fracture or destructive process.  There is chronic appearing mild mucosal thickening in  the bilateral paranasal sinuses. Chronic but notable irregularly-shaped polypoid structure from  the anterior left maxillary sinus.  Orbits: No acute abnormality.  Brain: No evidence of acute infarction, hemorrhage, hydrocephalus, or mass lesion/mass effect.  Remote perforator infarct involving the right caudate head and anterior putamen (with intervening white matter). Remote small cortical and subcortical infarct in the right parietal lobe, extending towards the right lateral ventricle. There is mild patchy bilateral small-vessel ischemic gliosis in the cerebral white matter.  IMPRESSION: 1. No acute intracranial findings. 2. Small remote infarcts in the right MCA territory.   Electronically Signed   By: Jorje Guild M.D.   On: 06/18/2014 04:02    EKG: Independently reviewed. Electronically paced.  Assessment/Plan Principal Problem:   Partial seizures Active Problems:   Essential hypertension   FIBRILLATION, ATRIAL   Pacemaker-Medtronic   Methotrexate, long term, current use   TIA (transient ischemic attack)   Vitamin B12 deficiency   1. Partial seizures The patient is presenting with complaints of left-sided facial droop and a near-syncopal be given. Neurologic has evaluated the patient and feels that the patient would have a partial seizure. With this the patient is currently admitted in telemetry unit. He will be started on Keppra. EEG in the morning. Serial neuro checks and seizure prophylaxis.  2. Possible TIA. Next and patient also had some left-sided facial droop. At present the CT of the head is negative for any get up from 180. Continue Xarelto serial neuro checks and monitor on telemetry and echocardiogram as well as carotid ultrasound.  3. B-12 deficiency. Patient is due for his B-12 injection. At present I would given subcutaneous B 12.  4. Hypertension. At present but is missing hypertension. May resume amlodipine later on.  Advance goals of care discussion: full  code   Consults: neurology  DVT Prophylaxis: chronic anticoagulation Nutrition: cardiac diet  Family Communication: wife was present at bedside, opportunity was given to ask question and all questions were answered satisfactorily at the time of interview. Disposition: Admitted to inpatient in telemetry unit.  Author: Berle Mull, MD [facility] Pager: [phone] 06/18/2014, 6:52 AM    If 7PM-7AM, please contact night-coverage [email or web address] Password [email or web address]

## 2014-06-18 NOTE — Evaluation (Signed)
Physical Therapy Evaluation Patient Details Name: Christopher Baker MRN: [id number] DOB: 1933/08/15 Today's Date: 06/18/2014   History of Present Illness  Patient is a 80 y/o male brought to ED due to confusion and left sided weakness. Wife reports when they were having a dinner outside the patient started having difficulty remembering his regular order as well as today's date. He also started having left-sided droopiness when the wife asked him to smile. Wife reports a near syncopal episode. Admitted for partial seizure vs TIA. Head CT- Small remote infarcts in the right MCA territory. Workup pending. PMH of right MCA territory stroke, HTN, HLD, atrial fibrillation on Xaralto, carotid stenosis bilaterally and CAD.    Clinical Impression  Patient tolerated ambulation and stair negotiation without difficulty. Mild gait deviations noted with higher level balance challenges but no overt LOB. Pt reports no deficits or symptoms. Pt is functioning close to baseline and does not require skilled therapy services. Discharge from therapy. Encourage ambulation with RN daily.    Follow Up Recommendations No PT follow up;Supervision - Intermittent    Equipment Recommendations  None recommended by PT    Recommendations for Other Services       Precautions / Restrictions Precautions Precautions: None Restrictions Weight Bearing Restrictions: No      Mobility  Bed Mobility Overal bed mobility: Modified Independent                Transfers Overall transfer level: Needs assistance Equipment used: None Transfers: Sit to/from Stand Sit to Stand: Independent            Ambulation/Gait Ambulation/Gait assistance: Modified independent (Device/Increase time) Ambulation Distance (Feet): 300 Feet Assistive device: None Gait Pattern/deviations: Step-through pattern;Decreased stride length   Gait velocity interpretation: Below normal speed for age/gender General Gait Details: Pt with steady  gait. Tolerated higher level balance challenges with mild deviations in gait. No LOB. Dyspnea present. 1 seated rest breaks. Vitals stable.  Stairs Stairs: Yes Stairs assistance: Supervision Stair Management: Alternating pattern;One rail Right Number of Stairs: 11 General stair comments: Supervision for safety.  Wheelchair Mobility    Modified Rankin (Stroke Patients Only)       Balance Overall balance assessment: Needs assistance Sitting-balance support: Feet supported;No upper extremity supported Sitting balance-Leahy Scale: Good     Standing balance support: During functional activity Standing balance-Leahy Scale: Good               High level balance activites: Direction changes;Turns;Sudden stops;Head turns               Pertinent Vitals/Pain Pain Assessment: No/denies pain    Home Living Family/patient expects to be discharged to:: Private residence Living Arrangements: Spouse/significant other Available Help at Discharge: Family;Available 24 hours/day Type of Home: House Home Access: Stairs to enter Entrance Stairs-Rails: Psychiatric nurse of Steps: 3 Home Layout: Two level;Able to live on main level with bedroom/bathroom Home Equipment: Gilford Rile - 2 wheels;Bedside commode      Prior Function Level of Independence: Independent               Hand Dominance   Dominant Hand: Right    Extremity/Trunk Assessment   Upper Extremity Assessment: Overall WFL for tasks assessed;Defer to OT evaluation           Lower Extremity Assessment: Overall WFL for tasks assessed         Communication   Communication: No difficulties  Cognition Arousal/Alertness: Awake/alert Behavior During Therapy: WFL for tasks assessed/performed Overall Cognitive Status:  Within Functional Limits for tasks assessed                      General Comments      Exercises        Assessment/Plan    PT Assessment Patent does not need  any further PT services  PT Diagnosis     PT Problem List    PT Treatment Interventions     PT Goals (Current goals can be found in the Care Plan section) Acute Rehab PT Goals PT Goal Formulation: All assessment and education complete, DC therapy    Frequency     Barriers to discharge        Co-evaluation               End of Session Equipment Utilized During Treatment: Gait belt Activity Tolerance: Patient tolerated treatment well Patient left: in chair;with call bell/phone within reach Nurse Communication: Mobility status         Time: 4920-1007 PT Time Calculation (min) (ACUTE ONLY): 20 min   Charges:   PT Evaluation $Initial PT Evaluation Tier I: 1 Procedure PT Treatments $Gait Training: 8-22 mins   PT G CodesCandy Sledge A 2014/07/06, 2:41 PM Candy Sledge, [place], DPT [phone]

## 2014-06-18 NOTE — ED Notes (Signed)
Patient transported to CT 

## 2014-06-18 NOTE — ED Notes (Signed)
Wife reports that pt had slight left facial droop while at dinner that has since resolved.  Pt remained altered for aproximmately 2.5 hrs.  Pt is now A&O x4 and at neurological baseline.  Pt has slight weakness and slight facial droop on left side which is affects from previous stroke.

## 2014-06-18 NOTE — Progress Notes (Signed)
Patient arrived from the ED. He denied any pain, appears in no distress. Safety precautions reviewed with patient. He verbalized understanding to call for assist. Call light with reach, alarm activated. TELE applied and confirmed. MD paged r/t his arrival. Will continue to monitor.   Ave Filter, RN

## 2014-06-18 NOTE — Progress Notes (Signed)
[facility] PROGRESS NOTE  ICHOLAS IRBY QMV:[id number] DOB: 1933-06-28 DOA: 06/18/2014 PCP: Donia Ast, FNP  Assessment/Plan: 1-Confusion, AMS:  TIA vs Seizure.  Started on Keppra.  Confusion resolved.  Unable to do MRI due to pacemaker.  Neurology following.  EEG order.  Stroke work up in process. ECHO and doppler ordered.   2-History of A fib on Xarelto.   3-B 12 deficiency:  Received B 12 injection.   4-HTN; permissive HTN.   5-Arthritis, psoriasis: on prednisone,   Code Status: full code.  Family Communication: care discussed with patient  Disposition Plan: remain inpatient    Consultants:  neurology  Procedures:  ECHO  Carotid doppler.   Antibiotics:  none  HPI/Subjective: He was oriented to place , time and situation.  He denies chest pain.  He was able to tell me about what happen yesterday at dinner.   Objective: Filed Vitals:   06/18/14 1333  BP: 177/77  Pulse: 71  Temp: 97.9 F (36.6 C)  Resp: 18    Intake/Output Summary (Last 24 hours) at 06/18/14 1406 Last data filed at 06/18/14 1348  Gross per 24 hour  Intake    480 ml  Output    750 ml  Net   -270 ml   Filed Weights   06/18/14 1146  Weight: 102.377 kg (225 lb 11.2 oz)    Exam:   General:  Alert in no distress.   Cardiovascular: S 1, S 2 RRR  Respiratory: CTA  Abdomen: BS present, soft, nt  Musculoskeletal: no edema.   Data Reviewed: Basic Metabolic Panel:  Recent Labs Lab 06/18/14 0011  NA 143  K 4.4  CL 110  CO2 27  GLUCOSE 134*  BUN 19  CREATININE 1.07  CALCIUM 8.9   Liver Function Tests:  Recent Labs Lab 06/18/14 0011  AST 22  ALT 24  ALKPHOS 68  BILITOT 0.3  PROT 5.5*  ALBUMIN 3.2*   No results for input(s): LIPASE, AMYLASE in the last 168 hours. No results for input(s): AMMONIA in the last 168 hours. CBC:  Recent Labs Lab 06/18/14 0011  WBC 7.6  NEUTROABS 5.7  HGB 11.1*  HCT 36.0*  MCV 87.8  PLT 183    Cardiac Enzymes: No results for input(s): CKTOTAL, CKMB, CKMBINDEX, TROPONINI in the last 168 hours. BNP (last 3 results) No results for input(s): PROBNP in the last 8760 hours. CBG:  Recent Labs Lab 06/18/14 1208  GLUCAP 81    No results found for this or any previous visit (from the past 240 hour(s)).   Studies: Dg Chest 2 View  06/18/2014   CLINICAL DATA:  Confusion.  EXAM: CHEST  2 VIEW  COMPARISON:  03/02/2014  FINDINGS: There is a dual-chamber pacer from the left; lead orientation is unchanged. Normal heart size and mediastinal contours. No acute infiltrate or edema. No effusion or pneumothorax. No acute osseous findings.  IMPRESSION: No active cardiopulmonary disease.   Electronically Signed   By: Jorje Guild M.D.   On: 06/18/2014 04:09   Ct Head (brain) Wo Contrast  06/18/2014   CLINICAL DATA:  Left-sided facial droop with subsequent resolution. Altered mental status.  EXAM: CT HEAD WITHOUT CONTRAST  TECHNIQUE: Contiguous axial images were obtained from the base of the skull through the vertex without intravenous contrast.  COMPARISON:  12/31/2013  FINDINGS: Skull and Sinuses:No acute fracture or destructive process.  There is chronic appearing mild mucosal thickening in the bilateral paranasal sinuses. Chronic but notable irregularly-shaped polypoid structure  from the anterior left maxillary sinus.  Orbits: No acute abnormality.  Brain: No evidence of acute infarction, hemorrhage, hydrocephalus, or mass lesion/mass effect.  Remote perforator infarct involving the right caudate head and anterior putamen (with intervening white matter). Remote small cortical and subcortical infarct in the right parietal lobe, extending towards the right lateral ventricle. There is mild patchy bilateral small-vessel ischemic gliosis in the cerebral white matter.  IMPRESSION: 1. No acute intracranial findings. 2. Small remote infarcts in the right MCA territory.   Electronically Signed   By:  Jorje Guild M.D.   On: 06/18/2014 04:02    Scheduled Meds: . darifenacin  15 mg Oral Daily  . ezetimibe  10 mg Oral Daily  . gabapentin  300 mg Oral QHS  . iron polysaccharides  150 mg Oral BID  . levETIRAcetam  500 mg Intravenous BID  . mirabegron ER  50 mg Oral Daily  . pantoprazole  40 mg Oral Daily  . predniSONE  7.5 mg Oral Q breakfast  . rivaroxaban  20 mg Oral Q supper  . rOPINIRole  2 mg Oral QHS   Continuous Infusions:   Principal Problem:   Partial seizures Active Problems:   Essential hypertension   FIBRILLATION, ATRIAL   Pacemaker-Medtronic   Methotrexate, long term, current use   TIA (transient ischemic attack)   Vitamin B12 deficiency    Time spent: 35 minutes.     Christopher Baker A  [facility] Pager [phone]. If 7PM-7AM, please contact night-coverage at [email or web address], password [email or web address] 06/18/2014, 2:06 PM  LOS: 0 days

## 2014-06-18 NOTE — Progress Notes (Deleted)
PT Cancellation Note  Patient Details Name: Christopher Baker MRN: [id number] DOB: September 22, 1933   Cancelled Treatment:    Reason Eval/Treat Not Completed: Patient not medically ready Pt does not have back brace yet, awaiting delivery of brace prior to PT evaluation. RN calling MD and biotech for order. Will follow up when pt has brace.   Candy Sledge A 06/18/2014, 11:28 AM  Candy Sledge, PT, DPT [phone]

## 2014-06-18 NOTE — Progress Notes (Signed)
  Echocardiogram 2D Echocardiogram has been performed.  Christopher Baker 06/18/2014, 4:55 PM

## 2014-06-18 NOTE — Progress Notes (Signed)
ANTICOAGULATION CONSULT NOTE - Initial Consult  Pharmacy Consult for Xarelto Indication: atrial fibrillation and secondary stroke prophylaxis  Labs:  Recent Labs  06/18/14 0011  HGB 11.1*  HCT 36.0*  PLT 183  CREATININE 1.07    Medical History: Past Medical History  Diagnosis Date  . Hyperlipidemia   . HTN (hypertension)   . Status post placement of cardiac pacemaker DDD-  06-04-10-- LAST CHECK 09-08-10 IN EPIC    SECONDARY TO SYNCOPY AND BRADYCARDIA  . Restless leg syndrome   . Normal nuclear stress test 2008    LOW RISK--   DR  WALL  . Carotid stenosis, bilateral PER DR EARLY / DUPLEX  09-09-10      40 - 59%   BILATERALLY--- ASYMPTOMATIC  . History of echocardiogram 11-21-2006    EF 65%  . History of prostate cancer S/P RADIATION  TX  6 YRS AGO  . Psoriasis   . Arthritis with psoriasis   . GERD (gastroesophageal reflux disease)     CONTROLLED W/ PROTONIX  . Borderline diabetes     DIET CONTROLLED - PT STATES HE IS NOT DIABETIC  . Lumbar spondylosis W/ RADICULOPATHY  . Iron deficiency   . Cataract immature LEFT EYE  . PAF (paroxysmal atrial fibrillation)   . SVT (supraventricular tachycardia)     S/P ABLATION   2008  . Coronary artery disease CARDIOLOGIST- DR Crissie Sickles    09-08-10 VISIT AND PACEMAKER CHECK  IN EPIC  . Cancer     HX OF PROSTATE CANCER--TX'D WITH RADIATION  . Sleep apnea TESTED YRS AGO-- NO CPAP RX GIVEN    PT STATES HE DOES NOT THINK HE STILL HAS SLEEP APNEA--DOES NOT USE CPAP  . Stroke Oct. 14, 2014    Assessment: Continuing PTA Xarelto for afib/secondary stroke prophylaxis. Hgb 11.1, renal function ok for age, other labs as above.   Goal of Therapy:  Monitor platelets by anticoagulation protocol: Yes   Plan:  -Continue Xarelto 20 mg daily with supper -Q72H CBC while inpatient -Monitor for bleeding  Narda Bonds 06/18/2014,6:55 AM

## 2014-06-18 NOTE — ED Notes (Signed)
Pt. Crawled out of the end of the bed to use the urinal.  Pt.'s call bell was right beside him.  Pt. Urinated onto the floor accidentally.  Talked with pt. About using call bell , that we want to help him with this.  Explained to pt. That we do not want him to fall and to call us when he needs to use the urinal.  He verbalized understanding.  Changed pt.s gown and housekeeping cleaned the floor.

## 2014-06-18 NOTE — Consult Note (Signed)
Reason for Consult: Altered mental status with left facial droop.  HPI:                                                                                                                                          Christopher Baker is an 80 y.o. male with a history of right MCA territory stroke, hypertension, hyperlipidemia, atrial fibrillation on Xaralto, carotid stenosis bilaterally and coronary artery disease, brought to the emergency room following an episode of confusion. Patient was unable to be a simple game that he and his wife plate for years. He also was confused regarding current month and date and was persistent with his responses. Afterwards he was lethargic and slumped over and fell asleep. There was left facial droop at one point. Speech was not slurred. CT scan of his head showed no acute intracranial abnormality. Previous right MCA territory infarctions were noted. Patient has an implanted cardiac pacemaker. His wife indicates patient has had low spells of transient confusion with difficulty performing task during the past 6 months, but not to this extent.  Past Medical History  Diagnosis Date  . Hyperlipidemia   . HTN (hypertension)   . Status post placement of cardiac pacemaker DDD-  06-04-10-- LAST CHECK 09-08-10 IN EPIC    SECONDARY TO SYNCOPY AND BRADYCARDIA  . Restless leg syndrome   . Normal nuclear stress test 2008    LOW RISK--   DR  WALL  . Carotid stenosis, bilateral PER DR EARLY / DUPLEX  09-09-10      40 - 59%   BILATERALLY--- ASYMPTOMATIC  . History of echocardiogram 11-21-2006    EF 65%  . History of prostate cancer S/P RADIATION  TX  6 YRS AGO  . Psoriasis   . Arthritis with psoriasis   . GERD (gastroesophageal reflux disease)     CONTROLLED W/ PROTONIX  . Borderline diabetes     DIET CONTROLLED - PT STATES HE IS NOT DIABETIC  . Lumbar spondylosis W/ RADICULOPATHY  . Iron deficiency   . Cataract immature LEFT EYE  . PAF (paroxysmal atrial fibrillation)   . SVT  (supraventricular tachycardia)     S/P ABLATION   2008  . Coronary artery disease CARDIOLOGIST- DR Crissie Sickles    09-08-10 VISIT AND PACEMAKER CHECK  IN EPIC  . Cancer     HX OF PROSTATE CANCER--TX'D WITH RADIATION  . Sleep apnea TESTED YRS AGO-- NO CPAP RX GIVEN    PT STATES HE DOES NOT THINK HE STILL HAS SLEEP APNEA--DOES NOT USE CPAP  . Stroke Oct. 14, 2014    Past Surgical History  Procedure Laterality Date  . Replacement total knee  1997    RIGHT  . Cardiac pacemaker placement  06-04-10    DDD/ AND REMOVAL LOOR RECORDER  . Loop recorder placement  01-30-10  . Lumbar laminectomy/ diskectomy/ fusion  05-19-10  L4 - 5  . Cholecystectomy  2009  . Cardiac electrophysiology study and ablation  2008    FOR SVT  . Prostate biopsy  2007  . Transurethral resection of prostate  2006  . Inguinal hernia repair  2005    LEFT/   DONE WITH PENILE PROSTESIS SURG.  . Removal and placement penile prosthesis implant   2005    AND LEFT CORPOROPLASTY /    DONE INGUINAL REPAIR  . Penile prosthesis implant  1995  . Knee arthroscopy  06/02/2011    Procedure: ARTHROSCOPY KNEE;  Surgeon: Gearlean Alf;  Location: [facility];  Service: Orthopedics;  Laterality: Left;  LEFT KNEE ARTHROSCOPY WITH DEBRIDEMENT  . Total knee arthroplasty  12/20/2011    Procedure: TOTAL KNEE ARTHROPLASTY;  Surgeon: Gearlean Alf, MD;  Location: [facility];  Service: Orthopedics;  Laterality: Left;  . Back surgery    . Joint replacement    . Spine surgery      Family History  Problem Relation Age of Onset  . Stroke Mother   . Early death Father     Social History:  reports that he quit smoking about 15 years ago. His smoking use included Cigarettes. He has a 5.5 pack-year smoking history. He has never used smokeless tobacco. He reports that he drinks alcohol. He reports that he does not use illicit drugs.  Allergies  Allergen Reactions  . Ciprofloxacin Nausea Only    REACTION: GI upset  . Other  Other (See Comments)    SCALLOPS--SUDDEN GI ATTACK  . Hydrocodone-Acetaminophen     Hallucinations in higher doses    MEDICATIONS:                                                                                                                     I have reviewed the patient's current medications.   ROS:                                                                                                                                       History obtained from spouse and the patient  General ROS: negative for - chills, fatigue, fever, night sweats, weight gain or weight loss Psychological ROS: negative for - behavioral disorder, hallucinations, memory difficulties, mood swings or suicidal ideation Ophthalmic ROS: negative for - blurry vision, double vision, eye pain or loss of vision ENT ROS: negative for -  epistaxis, nasal discharge, oral lesions, sore throat, tinnitus or vertigo Allergy and Immunology ROS: negative for - hives or itchy/watery eyes Hematological and Lymphatic ROS: negative for - bleeding problems, bruising or swollen lymph nodes Endocrine ROS: negative for - galactorrhea, hair pattern changes, polydipsia/polyuria or temperature intolerance Respiratory ROS: negative for - cough, hemoptysis, shortness of breath or wheezing Cardiovascular ROS: negative for - chest pain, dyspnea on exertion, edema or irregular heartbeat Gastrointestinal ROS: negative for - abdominal pain, diarrhea, hematemesis, nausea/vomiting or stool incontinence Genito-Urinary ROS: negative for - dysuria, hematuria, incontinence or urinary frequency/urgency Musculoskeletal ROS: negative for - joint swelling or muscular weakness Neurological ROS: as noted in HPI Dermatological ROS: negative for rash and skin lesion changes   Blood pressure 172/92, pulse 71, temperature 97.4 F (36.3 C), temperature source Oral, resp. rate 20, SpO2 97 %. Appearance was that of a Tylenol obese elderly-appearing man who was  alert and in no acute distress. HEENT: Unremarkable Neck was supple with no masses or tenderness. Extremities were normal in appearance with no discoloration or edema.  Neurologic Examination:                                                                                                      Mental Status: Alert, oriented, thought content appropriate.  Speech fluent without evidence of aphasia. Able to follow commands without difficulty. Cranial Nerves: II-Visual fields were normal. III/IV/VI-Pupils were equal and reacted. Extraocular movements were full and conjugate.    V/VII-no facial numbness and no facial weakness. VIII-normal. X-normal speech and symmetrical palatal movement. Motor: 5/5 bilaterally with normal tone and bulk Sensory: Normal throughout. Deep Tendon Reflexes: 1+ and symmetric. Plantars: Mute bilaterally Cerebellar: Normal finger-to-nose testing.  Lab Results  Component Value Date/Time   CHOL 123 04/12/2014 09:57 AM    Results for orders placed or performed during the hospital encounter of 06/18/14 (from the past 48 hour(s))  Comprehensive metabolic panel     Status: Abnormal   Collection Time: 06/18/14 12:11 AM  Result Value Ref Range   Sodium 143 135 - 145 mmol/L    Comment: Please note change in reference range.   Potassium 4.4 3.5 - 5.1 mmol/L    Comment: Please note change in reference range.   Chloride 110 96 - 112 mEq/L   CO2 27 19 - 32 mmol/L   Glucose, Bld 134 (H) 70 - 99 mg/dL   BUN 19 6 - 23 mg/dL   Creatinine, Ser 1.07 0.50 - 1.35 mg/dL   Calcium 8.9 8.4 - 10.5 mg/dL   Total Protein 5.5 (L) 6.0 - 8.3 g/dL   Albumin 3.2 (L) 3.5 - 5.2 g/dL   AST 22 0 - 37 U/L   ALT 24 0 - 53 U/L   Alkaline Phosphatase 68 39 - 117 U/L   Total Bilirubin 0.3 0.3 - 1.2 mg/dL   GFR calc non Af Amer 64 (L) >90 mL/min   GFR calc Af Amer 75 (L) >90 mL/min    Comment: (NOTE) The eGFR has been calculated using the CKD EPI equation. This calculation has not  been  validated in all clinical situations. eGFR's persistently <90 mL/min signify possible Chronic Kidney Disease.    Anion gap 6 5 - 15  CBC with Differential     Status: Abnormal   Collection Time: 06/18/14 12:11 AM  Result Value Ref Range   WBC 7.6 4.0 - 10.5 K/uL   RBC 4.10 (L) 4.22 - 5.81 MIL/uL   Hemoglobin 11.1 (L) 13.0 - 17.0 g/dL   HCT 36.0 (L) 39.0 - 52.0 %   MCV 87.8 78.0 - 100.0 fL   MCH 27.1 26.0 - 34.0 pg   MCHC 30.8 30.0 - 36.0 g/dL   RDW 16.0 (H) 11.5 - 15.5 %   Platelets 183 150 - 400 K/uL   Neutrophils Relative % 76 43 - 77 %   Neutro Abs 5.7 1.7 - 7.7 K/uL   Lymphocytes Relative 10 (L) 12 - 46 %   Lymphs Abs 0.8 0.7 - 4.0 K/uL   Monocytes Relative 13 (H) 3 - 12 %   Monocytes Absolute 1.0 0.1 - 1.0 K/uL   Eosinophils Relative 1 0 - 5 %   Eosinophils Absolute 0.1 0.0 - 0.7 K/uL   Basophils Relative 0 0 - 1 %   Basophils Absolute 0.0 0.0 - 0.1 K/uL  I-stat troponin, ED     Status: None   Collection Time: 06/18/14  3:26 AM  Result Value Ref Range   Troponin i, poc 0.01 0.00 - 0.08 ng/mL   Comment 3            Comment: Due to the release kinetics of cTnI, a negative result within the first hours of the onset of symptoms does not rule out myocardial infarction with certainty. If myocardial infarction is still suspected, repeat the test at appropriate intervals.   Urinalysis, Routine w reflex microscopic     Status: Abnormal   Collection Time: 06/18/14  3:51 AM  Result Value Ref Range   Color, Urine YELLOW YELLOW   APPearance CLOUDY (A) CLEAR   Specific Gravity, Urine 1.015 1.005 - 1.030   pH 5.5 5.0 - 8.0   Glucose, UA NEGATIVE NEGATIVE mg/dL   Hgb urine dipstick SMALL (A) NEGATIVE   Bilirubin Urine NEGATIVE NEGATIVE   Ketones, ur NEGATIVE NEGATIVE mg/dL   Protein, ur NEGATIVE NEGATIVE mg/dL   Urobilinogen, UA 0.2 0.0 - 1.0 mg/dL   Nitrite NEGATIVE NEGATIVE   Leukocytes, UA NEGATIVE NEGATIVE  Urine microscopic-add on     Status: None   Collection Time:  06/18/14  3:51 AM  Result Value Ref Range   WBC, UA 0-2 <3 WBC/hpf   RBC / HPF 0-2 <3 RBC/hpf    Dg Chest 2 View  06/18/2014   CLINICAL DATA:  Confusion.  EXAM: CHEST  2 VIEW  COMPARISON:  03/02/2014  FINDINGS: There is a dual-chamber pacer from the left; lead orientation is unchanged. Normal heart size and mediastinal contours. No acute infiltrate or edema. No effusion or pneumothorax. No acute osseous findings.  IMPRESSION: No active cardiopulmonary disease.   Electronically Signed   By: Jorje Guild M.D.   On: 06/18/2014 04:09   Ct Head (brain) Wo Contrast  06/18/2014   CLINICAL DATA:  Left-sided facial droop with subsequent resolution. Altered mental status.  EXAM: CT HEAD WITHOUT CONTRAST  TECHNIQUE: Contiguous axial images were obtained from the base of the skull through the vertex without intravenous contrast.  COMPARISON:  12/31/2013  FINDINGS: Skull and Sinuses:No acute fracture or destructive process.  There is chronic appearing mild  mucosal thickening in the bilateral paranasal sinuses. Chronic but notable irregularly-shaped polypoid structure from the anterior left maxillary sinus.  Orbits: No acute abnormality.  Brain: No evidence of acute infarction, hemorrhage, hydrocephalus, or mass lesion/mass effect.  Remote perforator infarct involving the right caudate head and anterior putamen (with intervening white matter). Remote small cortical and subcortical infarct in the right parietal lobe, extending towards the right lateral ventricle. There is mild patchy bilateral small-vessel ischemic gliosis in the cerebral white matter.  IMPRESSION: 1. No acute intracranial findings. 2. Small remote infarcts in the right MCA territory.   Electronically Signed   By: Jorje Guild M.D.   On: 06/18/2014 04:02   Assessment/Plan: 80 year old man with multiple medical problems including previous right MCA strokes 14 months ago, as well as atrial fibrillation on anticoagulation, presenting with  probable new onset partial seizure disorder, likely related to the venous right MCA cerebral infarctions. TIA cannot be ruled out. As well recurrent small right cerebral infarction cannot be ruled out. Unfortunately patient cannot undergo MRI study because of his pacemaker.  Recommendations: 1. Recommend starting Keppra 500 mg twice a day 2. EEG, routine adult study 3. Stroke/TIA workup to include carotid Doppler study, 2-D echocardiogram, fasting lipid panel, hemoglobin A1c 4. Continue anticoagulation with Jennye Moccasin  We will continue to follow this patient with you.  C.R. Nicole Kindred, MD [facility] [phone]  06/18/2014, 6:27 AM

## 2014-06-18 NOTE — ED Provider Notes (Signed)
CSN: [id number]     Arrival date & time 06/17/14  2338 History  This chart was scribe for Christopher Polus K Mearl Olver-Rasch, MD by Judithann Sauger, ED Scribe. The patient was seen in room B16C/B16C and the patient's care was started at 2:45 AM.    Chief Complaint  Patient presents with  . Altered Mental Status   Patient is a 80 y.o. male presenting with altered mental status. The history is provided by the patient. No language interpreter was used.  Altered Mental Status Presenting symptoms: behavior changes, confusion and memory loss   Severity:  Severe Most recent episode:  Yesterday Duration:  2 hours Timing:  Constant Progression:  Resolved Chronicity:  Recurrent Context: not dementia and not drug use   Context comment:  Stroke 14 months ago Associated symptoms: no light-headedness, no visual change and no weakness    HPI Comments: Christopher Baker is a 80 y.o. male who presents to the Emergency Department status post a possible TIA that lasted a couple hours. His wife reports associated confusing when ordering his meal yesterday night and how to play a game he is normally familiar with. She adds that he could not remember what day it was or what holiday had just passed. She states that his BP was elevated and his left face drooped. He reports frequent urination. He denies dysuria. He reports that he had a stroke 14 months ago and suffered short term memory.   PCP: Dr. Megan Salon  Past Medical History  Diagnosis Date  . Hyperlipidemia   . HTN (hypertension)   . Status post placement of cardiac pacemaker DDD-  06-04-10-- LAST CHECK 09-08-10 IN EPIC    SECONDARY TO SYNCOPY AND BRADYCARDIA  . Restless leg syndrome   . Normal nuclear stress test 2008    LOW RISK--   DR  WALL  . Carotid stenosis, bilateral PER DR EARLY / DUPLEX  09-09-10      40 - 59%   BILATERALLY--- ASYMPTOMATIC  . History of echocardiogram 11-21-2006    EF 65%  . History of prostate cancer S/P RADIATION  TX  6 YRS AGO  .  Psoriasis   . Arthritis with psoriasis   . GERD (gastroesophageal reflux disease)     CONTROLLED W/ PROTONIX  . Borderline diabetes     DIET CONTROLLED - PT STATES HE IS NOT DIABETIC  . Lumbar spondylosis W/ RADICULOPATHY  . Iron deficiency   . Cataract immature LEFT EYE  . PAF (paroxysmal atrial fibrillation)   . SVT (supraventricular tachycardia)     S/P ABLATION   2008  . Coronary artery disease CARDIOLOGIST- DR Crissie Sickles    09-08-10 VISIT AND PACEMAKER CHECK  IN EPIC  . Cancer     HX OF PROSTATE CANCER--TX'D WITH RADIATION  . Sleep apnea TESTED YRS AGO-- NO CPAP RX GIVEN    PT STATES HE DOES NOT THINK HE STILL HAS SLEEP APNEA--DOES NOT USE CPAP  . Stroke Oct. 14, 2014   Past Surgical History  Procedure Laterality Date  . Replacement total knee  1997    RIGHT  . Cardiac pacemaker placement  06-04-10    DDD/ AND REMOVAL LOOR RECORDER  . Loop recorder placement  01-30-10  . Lumbar laminectomy/ diskectomy/ fusion  05-19-10    L4 - 5  . Cholecystectomy  2009  . Cardiac electrophysiology study and ablation  2008    FOR SVT  . Prostate biopsy  2007  . Transurethral resection of prostate  2006  .  Inguinal hernia repair  2005    LEFT/   DONE WITH PENILE PROSTESIS SURG.  . Removal and placement penile prosthesis implant   2005    AND LEFT CORPOROPLASTY /    DONE INGUINAL REPAIR  . Penile prosthesis implant  1995  . Knee arthroscopy  06/02/2011    Procedure: ARTHROSCOPY KNEE;  Surgeon: Gearlean Alf;  Location: [facility];  Service: Orthopedics;  Laterality: Left;  LEFT KNEE ARTHROSCOPY WITH DEBRIDEMENT  . Total knee arthroplasty  12/20/2011    Procedure: TOTAL KNEE ARTHROPLASTY;  Surgeon: Gearlean Alf, MD;  Location: [facility];  Service: Orthopedics;  Laterality: Left;  . Back surgery    . Joint replacement    . Spine surgery     Family History  Problem Relation Age of Onset  . Stroke Mother   . Early death Father    History  Substance Use Topics  .  Smoking status: Former Smoker -- 0.10 packs/day for 55 years    Types: Cigarettes    Quit date: 12/07/1998  . Smokeless tobacco: Never Used  . Alcohol Use: Yes     Comment: RARE    Review of Systems  Genitourinary: Positive for frequency. Negative for dysuria.  Neurological: Positive for facial asymmetry. Negative for speech difficulty, weakness, light-headedness and numbness.  Psychiatric/Behavioral: Positive for memory loss and confusion.  All other systems reviewed and are negative.     Allergies  Ciprofloxacin; Other; and Hydrocodone-acetaminophen  Home Medications   Prior to Admission medications   Medication Sig Start Date End Date Taking? Authorizing Provider  atorvastatin (LIPITOR) 20 MG tablet Take 20 mg by mouth daily.    Historical Provider, MD  citalopram (CELEXA) 20 MG tablet Take 1.5 tablets (30 mg total) by mouth daily. 05/14/14   Timoteo Gaul, FNP  clotrimazole-betamethasone (LOTRISONE) cream Apply 1 application topically 2 (two) times daily as needed (psoriasis).    Historical Provider, MD  Cyanocobalamin (VITAMIN B-12 IJ) Inject 3 mLs as directed every 14 (fourteen) days. Every two weeks    Historical Provider, MD  ezetimibe (ZETIA) 10 MG tablet Take 1 tablet (10 mg total) by mouth daily. 08/22/13   Ricard Dillon, MD  ipratropium (ATROVENT) 0.06 % nasal spray Place 2 sprays into both nostrils 4 (four) times daily. 08/22/13   Ricard Dillon, MD  iron polysaccharides (NIFEREX) 150 MG capsule Take 150 mg by mouth 2 (two) times daily.    Historical Provider, MD  lisinopril (PRINIVIL,ZESTRIL) 10 MG tablet Take 1 tablet (10 mg total) by mouth 2 (two) times daily. 08/22/13   Ricard Dillon, MD  methotrexate (RHEUMATREX) 2.5 MG tablet Take 10 mg by mouth once a week. Caution:Chemotherapy. Protect from light. Every monday    Historical Provider, MD  mirabegron ER (MYRBETRIQ) 50 MG TB24 tablet Take 1 tablet (50 mg total) by mouth daily. 06/29/13   Ricard Dillon, MD   pantoprazole (PROTONIX) 40 MG tablet Take 1 tablet (40 mg total) by mouth daily. 08/22/13   Ricard Dillon, MD  predniSONE (DELTASONE) 5 MG tablet Take 1.5 tablets (7.5 mg total) by mouth daily. 06/17/14   Timoteo Gaul, FNP  Rivaroxaban (XARELTO) 20 MG TABS tablet Take 1 tablet (20 mg total) by mouth daily with supper. This is the new medication for stroke prevention 06/29/13   Ricard Dillon, MD  rOPINIRole (REQUIP) 2 MG tablet Take 1 tablet (2 mg total) by mouth at bedtime. 01/14/14   Timoteo Gaul,  FNP  solifenacin (VESICARE) 10 MG tablet Take 1 tablet (10 mg total) by mouth daily. Use vesicare or ditropan--not both 06/29/13   Ricard Dillon, MD  triamcinolone cream (KENALOG) 0.1 % Apply to affected area as directed 04/03/14   Timoteo Gaul, FNP   BP 154/123 mmHg  Pulse 71  Temp(Src) 97.4 F (36.3 C) (Oral)  Resp 20  SpO2 100% Physical Exam  Constitutional: He appears well-developed and well-nourished. No distress.  HENT:  Head: Normocephalic and atraumatic.  Mouth/Throat: Oropharynx is clear and moist.  Eyes: Conjunctivae and EOM are normal. Pupils are equal, round, and reactive to light.  Neck: Normal range of motion. Neck supple. No tracheal deviation present.  Cardiovascular: Normal rate, regular rhythm and intact distal pulses.   Pulmonary/Chest: Effort normal. No respiratory distress.  Lungs clear  Abdominal: Soft. Bowel sounds are normal. He exhibits no mass. There is no tenderness. There is no rebound.  Musculoskeletal: Normal range of motion.  Neurological: He is alert. He has normal reflexes. He displays normal reflexes. No cranial nerve deficit. He exhibits normal muscle tone. Coordination normal.  Skin: Skin is warm and dry.  Psychiatric: He has a normal mood and affect. His behavior is normal.  Nursing note and vitals reviewed.   ED Course  Procedures (including critical care time) DIAGNOSTIC STUDIES: Oxygen Saturation is 100% on RA, normal by my  interpretation.    COORDINATION OF CARE: 2:52 AM- Pt advised of plan for treatment and pt agrees.    Labs Review Labs Reviewed  COMPREHENSIVE METABOLIC PANEL - Abnormal; Notable for the following:    Glucose, Bld 134 (*)    Total Protein 5.5 (*)    Albumin 3.2 (*)    GFR calc non Af Amer 64 (*)    GFR calc Af Amer 75 (*)    All other components within normal limits  CBC WITH DIFFERENTIAL - Abnormal; Notable for the following:    RBC 4.10 (*)    Hemoglobin 11.1 (*)    HCT 36.0 (*)    RDW 16.0 (*)    Lymphocytes Relative 10 (*)    Monocytes Relative 13 (*)    All other components within normal limits    Imaging Review No results found.   EKG Interpretation None      MDM   Final diagnoses:  None   Per dr. Posey Pronto consult neuro obs tele  I personally performed the services described in this documentation, which was scribed in my presence. The recorded information has been reviewed and is accurate.    Carlisle Beers, MD 06/18/14 [phone]

## 2014-06-19 ENCOUNTER — Ambulatory Visit (HOSPITAL_COMMUNITY): Payer: Commercial Managed Care - HMO

## 2014-06-19 DIAGNOSIS — I482 Chronic atrial fibrillation: Secondary | ICD-10-CM

## 2014-06-19 LAB — GLUCOSE, CAPILLARY
GLUCOSE-CAPILLARY: 101 mg/dL — AB (ref 70–99)
GLUCOSE-CAPILLARY: 94 mg/dL (ref 70–99)
Glucose-Capillary: 115 mg/dL — ABNORMAL HIGH (ref 70–99)
Glucose-Capillary: 108 mg/dL — ABNORMAL HIGH (ref 70–99)

## 2014-06-19 LAB — BASIC METABOLIC PANEL
ANION GAP: 3 — AB (ref 5–15)
BUN: 14 mg/dL (ref 6–23)
CHLORIDE: 112 meq/L (ref 96–112)
CO2: 26 mmol/L (ref 19–32)
Calcium: 9.1 mg/dL (ref 8.4–10.5)
Creatinine, Ser: 1.03 mg/dL (ref 0.50–1.35)
GFR calc Af Amer: 78 mL/min — ABNORMAL LOW (ref >90)
GFR calc non Af Amer: 67 mL/min — ABNORMAL LOW (ref >90)
Glucose, Bld: 114 mg/dL — ABNORMAL HIGH (ref 70–99)
POTASSIUM: 4.2 mmol/L (ref 3.5–5.1)
Sodium: 141 mmol/L (ref 135–145)

## 2014-06-19 LAB — LIPID PANEL
Cholesterol: 146 mg/dL (ref 0–200)
HDL: 55 mg/dL (ref >39)
LDL Cholesterol: 78 mg/dL (ref 0–99)
Total CHOL/HDL Ratio: 2.7 RATIO
Triglycerides: 64 mg/dL (ref <150)
VLDL: 13 mg/dL (ref 0–40)

## 2014-06-19 MED ORDER — LISINOPRIL 10 MG PO TABS
10.0000 mg | ORAL_TABLET | Freq: Two times a day (BID) | ORAL | Status: DC
  Administered 2014-06-19: 10 mg via ORAL
  Filled 2014-06-19: qty 1

## 2014-06-19 MED ORDER — LISINOPRIL 10 MG PO TABS
10.0000 mg | ORAL_TABLET | Freq: Every day | ORAL | Status: DC
  Administered 2014-06-20: 10 mg via ORAL
  Filled 2014-06-19: qty 1

## 2014-06-19 NOTE — Procedures (Signed)
ELECTROENCEPHALOGRAM REPORT   Patient: Christopher Baker       Room #: 9C78 EEG No. ID: [id number] Age: 80 y.o.        Sex: male Referring Physician: Regalado Report Date:  06/19/2014        Interpreting Physician: Alexis Goodell  History: Christopher Baker is an 80 y.o. male with possible new onset focal seizure activity  Medications:  Scheduled: . darifenacin  15 mg Oral Daily  . ezetimibe  10 mg Oral Daily  . gabapentin  300 mg Oral QHS  . iron polysaccharides  150 mg Oral BID  . levETIRAcetam  500 mg Intravenous BID  . lisinopril  10 mg Oral BID  . mirabegron ER  50 mg Oral Daily  . pantoprazole  40 mg Oral Daily  . predniSONE  7.5 mg Oral Q breakfast  . rivaroxaban  20 mg Oral Q supper  . rOPINIRole  2 mg Oral QHS    Conditions of Recording:  This is a 16 channel EEG carried out with the patient in the awake and drowsy states.  Description:  The waking background activity consists of a low voltage, symmetrical, fairly well organized, 8 Hz alpha activity, seen from the parieto-occipital and posterior temporal regions.  Low voltage fast activity, poorly organized, is seen anteriorly and is at times superimposed on more posterior regions.  A mixture of theta and alpha rhythms are seen from the central and temporal regions. The patient drowses with slowing to irregular, low voltage theta and beta activity.   Stage II sleep is not obtained. Some leg movement was captured during the recording.  No epileptiform activity was noted.   Hyperventilation and intermittent photic stimulation were not performed.   IMPRESSION: This is a normal awake and drowsy electroencephalogram.  No epileptiform activity was noted.     Alexis Goodell, MD [facility] [phone] 06/19/2014, 1:03 PM

## 2014-06-19 NOTE — Progress Notes (Signed)
[facility] PROGRESS NOTE  Christopher Baker PNT:[id number] DOB: [birth year] DOA: 06/18/2014 PCP: Donia Ast, FNP  Assessment/Plan: 1-Confusion, AMS:  TIA vs Seizure.  Started on Keppra. Neurology recommend to continue with keppra.  Patient notice to be different today by wife, he is more angry. He was also unsteady and had lack of awareness to hygiene concerning.  Unable to do MRI due to pacemaker.  Neurology following.  EEG negative. Marland Kitchen  ECHO; mildly decrease EF and doppler 40 % stenosis. .  Plan to repeat CT head in am.   2-History of A fib on Xarelto.   3-B 12 deficiency:  Received B 12 injection.   4-HTN; permissive HTN. Resume low dose lisinopril.   5-Arthritis, psoriasis: on prednisone,   Code Status: full code.  Family Communication: care discussed with patient  Disposition Plan: remain inpatient    Consultants:  neurology  Procedures:  ECHO  Carotid doppler.   Antibiotics:  none  HPI/Subjective: He is alert answering questions. He is upset because he wanted to go home. He doesn't recall having any problem today while working with OT.    Objective: Filed Vitals:   06/19/14 1431  BP: 144/66  Pulse: 59  Temp: 97.9 F (36.6 C)  Resp: 18    Intake/Output Summary (Last 24 hours) at 06/19/14 1508 Last data filed at 06/19/14 1302  Gross per 24 hour  Intake    480 ml  Output      0 ml  Net    480 ml   Filed Weights   06/18/14 1146  Weight: 102.377 kg (225 lb 11.2 oz)    Exam:   General:  Alert in no distress.   Cardiovascular: S 1, S 2 RRR  Respiratory: CTA  Abdomen: BS present, soft, nt  Musculoskeletal: no edema.   Data Reviewed: Basic Metabolic Panel:  Recent Labs Lab 06/18/14 0011 06/19/14 0815  NA 143 141  K 4.4 4.2  CL 110 112  CO2 27 26  GLUCOSE 134* 114*  BUN 19 14  CREATININE 1.07 1.03  CALCIUM 8.9 9.1   Liver Function Tests:  Recent Labs Lab 06/18/14 0011  AST 22  ALT 24  ALKPHOS 68   BILITOT 0.3  PROT 5.5*  ALBUMIN 3.2*   No results for input(s): LIPASE, AMYLASE in the last 168 hours. No results for input(s): AMMONIA in the last 168 hours. CBC:  Recent Labs Lab 06/18/14 0011  WBC 7.6  NEUTROABS 5.7  HGB 11.1*  HCT 36.0*  MCV 87.8  PLT 183   Cardiac Enzymes: No results for input(s): CKTOTAL, CKMB, CKMBINDEX, TROPONINI in the last 168 hours. BNP (last 3 results) No results for input(s): PROBNP in the last 8760 hours. CBG:  Recent Labs Lab 06/18/14 1208 06/18/14 1648 06/18/14 2209 06/19/14 0633 06/19/14 1129  GLUCAP 81 107* 130* 101* 94    No results found for this or any previous visit (from the past 240 hour(s)).   Studies: Dg Chest 2 View  06/18/2014   CLINICAL DATA:  Confusion.  EXAM: CHEST  2 VIEW  COMPARISON:  03/02/2014  FINDINGS: There is a dual-chamber pacer from the left; lead orientation is unchanged. Normal heart size and mediastinal contours. No acute infiltrate or edema. No effusion or pneumothorax. No acute osseous findings.  IMPRESSION: No active cardiopulmonary disease.   Electronically Signed   By: Jorje Guild M.D.   On: 06/18/2014 04:09   Ct Head (brain) Wo Contrast  06/18/2014   CLINICAL DATA:  Left-sided  facial droop with subsequent resolution. Altered mental status.  EXAM: CT HEAD WITHOUT CONTRAST  TECHNIQUE: Contiguous axial images were obtained from the base of the skull through the vertex without intravenous contrast.  COMPARISON:  12/31/2013  FINDINGS: Skull and Sinuses:No acute fracture or destructive process.  There is chronic appearing mild mucosal thickening in the bilateral paranasal sinuses. Chronic but notable irregularly-shaped polypoid structure from the anterior left maxillary sinus.  Orbits: No acute abnormality.  Brain: No evidence of acute infarction, hemorrhage, hydrocephalus, or mass lesion/mass effect.  Remote perforator infarct involving the right caudate head and anterior putamen (with intervening white  matter). Remote small cortical and subcortical infarct in the right parietal lobe, extending towards the right lateral ventricle. There is mild patchy bilateral small-vessel ischemic gliosis in the cerebral white matter.  IMPRESSION: 1. No acute intracranial findings. 2. Small remote infarcts in the right MCA territory.   Electronically Signed   By: Jorje Guild M.D.   On: 06/18/2014 04:02    Scheduled Meds: . darifenacin  15 mg Oral Daily  . ezetimibe  10 mg Oral Daily  . gabapentin  300 mg Oral QHS  . iron polysaccharides  150 mg Oral BID  . levETIRAcetam  500 mg Intravenous BID  . [START ON 06/20/2014] lisinopril  10 mg Oral Daily  . mirabegron ER  50 mg Oral Daily  . pantoprazole  40 mg Oral Daily  . predniSONE  7.5 mg Oral Q breakfast  . rivaroxaban  20 mg Oral Q supper  . rOPINIRole  2 mg Oral QHS   Continuous Infusions:   Principal Problem:   Partial seizures Active Problems:   Essential hypertension   FIBRILLATION, ATRIAL   Pacemaker-[employer]   Methotrexate, long term, current use   TIA (transient ischemic attack)   Vitamin B12 deficiency    Time spent: 25 minutes.     [name]  [facility] Pager [phone]. If 7PM-7AM, please contact night-coverage at [email or web address], password [facility] 06/19/2014, 3:08 PM  LOS: 1 day

## 2014-06-19 NOTE — Progress Notes (Signed)
CARE MANAGEMENT NOTE 06/19/2014  Patient:  Christopher Baker, Christopher Baker   Account Number:  [id number]  Date Initiated:  06/19/2014  Documentation initiated by:  Olga Coaster  Subjective/Objective Assessment:   ADMITTED WITH NEW ONSET SEIZURE/ STROKE WORKUP     Action/Plan:   CM FOLLOWING FOR DCP   Anticipated DC Date:  06/22/2014   Anticipated DC Plan:  HOME      DC Planning Services  CM consult       Status of service:  In process, will continue to follow Medicare Important Message given?   (If response is "NO", the following Medicare IM given date fields will be blank)  Per UR Regulation:  Reviewed for med. necessity/level of care/duration of stay  Comments:  12/30/2015Mindi Slicker RN,BSN,MHA [phone]

## 2014-06-19 NOTE — Progress Notes (Signed)
OT NOTE  Pt noted to have x2 large LOB this AM with OT evaluation. Pt incontinence of bladder which pt reports has occurred on occasion since dx with prostate CA. Pt however not attempting to perform peri hygiene and needed max cueing to initiate. Lack of awareness to hygiene concerning.  Pt requires staff assistance due to LOB during session. Pt and RN LaQuitta educated on need for (A) for all transfers now at this time.    Jeri Modena   OTR/L Pager: [phone] Office: [phone] .

## 2014-06-19 NOTE — Progress Notes (Signed)
NEURO HOSPITALIST PROGRESS NOTE   SUBJECTIVE:                                                                                                                        Christopher Baker has no neurological complains this morning. No further spells of confusion noted. Tolerating keppra well. EEG pending. CUS without hemodynamically significant carotid disease. TTE unimpressive. Lipid panel pending. Unable to have MRI due to pacemaker.   OBJECTIVE:                                                                                                                           Vital signs in last 24 hours: Temp:  [97.3 F (36.3 C)-98.2 F (36.8 C)] 98 F (36.7 C) (12/30 0401) Pulse Rate:  [67-81] 70 (12/30 0401) Resp:  [14-22] 18 (12/30 0401) BP: (121-177)/(46-88) 173/75 mmHg (12/30 0401) SpO2:  [94 %-99 %] 98 % (12/30 0401) Weight:  [102.377 kg (225 lb 11.2 oz)] 102.377 kg (225 lb 11.2 oz) (12/29 1146)  Intake/Output from previous day: 12/29 0701 - 12/30 0700 In: 720 [P.O.:720] Out: 500 [Urine:500] Intake/Output this shift:   Nutritional status: Diet heart healthy/carb modified  Past Medical History  Diagnosis Date  . Hyperlipidemia   . HTN (hypertension)   . Status post placement of cardiac pacemaker DDD-  06-04-10-- LAST CHECK 09-08-10 IN EPIC    SECONDARY TO SYNCOPY AND BRADYCARDIA  . Restless leg syndrome   . Normal nuclear stress test 2008    LOW RISK--   DR  WALL  . Carotid stenosis, bilateral PER DR EARLY / DUPLEX  09-09-10      40 - 59%   BILATERALLY--- ASYMPTOMATIC  . History of echocardiogram 11-21-2006    EF 65%  . History of prostate cancer S/P RADIATION  TX  6 YRS AGO  . Psoriasis   . Arthritis with psoriasis   . GERD (gastroesophageal reflux disease)     CONTROLLED W/ PROTONIX  . Borderline diabetes     DIET CONTROLLED - PT STATES HE IS NOT DIABETIC  . Lumbar spondylosis W/ RADICULOPATHY  . Iron deficiency   . Cataract immature  LEFT EYE  . PAF (paroxysmal atrial fibrillation)   . SVT (supraventricular tachycardia)  S/P ABLATION   2008  . Coronary artery disease CARDIOLOGIST- DR Crissie Sickles    09-08-10 VISIT AND PACEMAKER CHECK  IN EPIC  . Cancer     HX OF PROSTATE CANCER--TX'D WITH RADIATION  . Sleep apnea TESTED YRS AGO-- NO CPAP RX GIVEN    PT STATES HE DOES NOT THINK HE STILL HAS SLEEP APNEA--DOES NOT USE CPAP  . Stroke Oct. 14, 2014    Physical exam: pleasant male in no apparent distress. Head: normocephalic. Neck: supple, no bruits, no JVD. Cardiac: no murmurs. Lungs: clear. Abdomen: soft, no tender, no mass. Extremities: no edema.  Neurologic Exam:  Mental Status: Alert, oriented, thought content appropriate. Speech fluent without evidence of aphasia. Able to follow commands without difficulty. Cranial Nerves: II-Visual fields were normal. III/IV/VI-Pupils were equal and reacted. Extraocular movements were full and conjugate.   V/VII-no facial numbness and no facial weakness. VIII-normal. X-normal speech and symmetrical palatal movement. Motor: 5/5 bilaterally with normal tone and bulk Sensory: Normal throughout. Deep Tendon Reflexes: 1+ and symmetric. Plantars: Mute bilaterally Cerebellar: Normal finger-to-nose testing. Gait: no tested due to safety reasons.  Lab Results: Lab Results  Component Value Date/Time   CHOL 123 04/12/2014 09:57 AM   Lipid Panel No results for input(s): CHOL, TRIG, HDL, CHOLHDL, VLDL, LDLCALC in the last 72 hours.  Studies/Results: Dg Chest 2 View  06/18/2014   CLINICAL DATA:  Confusion.  EXAM: CHEST  2 VIEW  COMPARISON:  03/02/2014  FINDINGS: There is a dual-chamber pacer from the left; lead orientation is unchanged. Normal heart size and mediastinal contours. No acute infiltrate or edema. No effusion or pneumothorax. No acute osseous findings.  IMPRESSION: No active cardiopulmonary disease.   Electronically Signed   By: Jorje Guild M.D.   On:  06/18/2014 04:09   Ct Head (brain) Wo Contrast  06/18/2014   CLINICAL DATA:  Left-sided facial droop with subsequent resolution. Altered mental status.  EXAM: CT HEAD WITHOUT CONTRAST  TECHNIQUE: Contiguous axial images were obtained from the base of the skull through the vertex without intravenous contrast.  COMPARISON:  12/31/2013  FINDINGS: Skull and Sinuses:No acute fracture or destructive process.  There is chronic appearing mild mucosal thickening in the bilateral paranasal sinuses. Chronic but notable irregularly-shaped polypoid structure from the anterior left maxillary sinus.  Orbits: No acute abnormality.  Brain: No evidence of acute infarction, hemorrhage, hydrocephalus, or mass lesion/mass effect.  Remote perforator infarct involving the right caudate head and anterior putamen (with intervening white matter). Remote small cortical and subcortical infarct in the right parietal lobe, extending towards the right lateral ventricle. There is mild patchy bilateral small-vessel ischemic gliosis in the cerebral white matter.  IMPRESSION: 1. No acute intracranial findings. 2. Small remote infarcts in the right MCA territory.   Electronically Signed   By: Jorje Guild M.D.   On: 06/18/2014 04:02    MEDICATIONS  Scheduled: . darifenacin  15 mg Oral Daily  . ezetimibe  10 mg Oral Daily  . gabapentin  300 mg Oral QHS  . iron polysaccharides  150 mg Oral BID  . levETIRAcetam  500 mg Intravenous BID  . mirabegron ER  50 mg Oral Daily  . pantoprazole  40 mg Oral Daily  . predniSONE  7.5 mg Oral Q breakfast  . rivaroxaban  20 mg Oral Q supper  . rOPINIRole  2 mg Oral QHS    ASSESSMENT/PLAN:                                                                                                            80 year old man with multiple medical problems including previous right MCA strokes  14 months ago, a presenting with probable new onset focal seizure with impairment of consciousness versus TIA. Patient started on keppra which he is tolerating well. EEG pending, but would be in favor of maintaining him on keppra for now regardless of the EEG results. Patient needs outpatient neurology follow up in 3-4 weeks. Neurology will sign off   Dorian Pod, MD [facility] [phone]  06/19/2014, 8:30 AM

## 2014-06-19 NOTE — Evaluation (Signed)
Occupational Therapy Evaluation Patient Details Name: Christopher Baker MRN: [id number] DOB: [birth year] Today's Date: 06/19/2014    History of Present Illness 80 yo male admitted due to confusion and L side weakness. PT with near syncopal episode per wife. CT (+) R MCA  PMH: R MCA, HTN HLD, AFIB CAD   Clinical Impression   PT admitted with R MCA. Pt currently with functional limitiations due to the deficits listed below (see OT problem list). t was mod I prior to admission.  Pt will benefit from skilled OT to increase their independence and safety with adls and balance to allow discharge d/c home with wife (A). OT to follow acutely to dynamic standing with ADLS     Follow Up Recommendations  No OT follow up    Equipment Recommendations  None recommended by OT    Recommendations for Other Services       Precautions / Restrictions Precautions Precautions: Fall Restrictions Weight Bearing Restrictions: No      Mobility Bed Mobility Overal bed mobility: Modified Independent             General bed mobility comments: requries incr time. Pt with x3 unsuccessful attempts. pt states "this bed is just so dang hard to get out of"  Transfers Overall transfer level: Needs assistance   Transfers: Sit to/from Stand Sit to Stand: Min guard         General transfer comment: Pt bracing bil LE against bed surface with posterior lean with initial standing.     Balance Overall balance assessment: Needs assistance         Standing balance support: Single extremity supported;During functional activity Standing balance-Leahy Scale: Fair                              ADL Overall ADL's : Needs assistance/impaired     Grooming: Oral care;Minimal assistance;Standing (due to LOB with head turn) Grooming Details (indicate cue type and reason): pt looking R and then L quickly with LOB posteriorly Upper Body Bathing: Supervision/ safety;Sitting   Lower Body Bathing:  Supervison/ safety;Sit to/from stand Lower Body Bathing Details (indicate cue type and reason): pt unsteady with standing and needed cues to return to seated for LB bathing Upper Body Dressing : Supervision/safety   Lower Body Dressing: Supervision/safety;Sit to/from stand Lower Body Dressing Details (indicate cue type and reason): pt encouraged to sit for dressing. pt attempting to static stand with L UE on sink surface to don pants Toilet Transfer: Min guard;Ambulation;Regular Glass blower/designer Details (indicate cue type and reason): static standing to void bladder.  Toileting- Clothing Manipulation and Hygiene: Min guard Toileting - Clothing Manipulation Details (indicate cue type and reason): pt incontinent at sink and then unaware of damp clothing. pt needed cueing to change clothing. pt states "i think this is my only clean pair i have left." OT attempting to encourage pt to change briefs because they are no longer "Clean". pt agreeable to don PJ pants and doff briefs. pt reports hx of urgency since prostate CA     Functional mobility during ADLs: Min guard General ADL Comments: Pt with LOB with head turn and attempting to pick object up off floor. Pt with large Max (A) LOB posteriorly. pt grabbing at shower curtain. pt states "I thought that would hold me but it didnt" Pt with incr fall risk. Pt was independent with PT yesterday but very unsteady this AM. Pt and RN educated  that (A) is required for all transfers at this time.     Vision                     Perception     Praxis      Pertinent Vitals/Pain Pain Assessment: No/denies pain     Hand Dominance Right   Extremity/Trunk Assessment Upper Extremity Assessment Upper Extremity Assessment: Overall WFL for tasks assessed   Lower Extremity Assessment Lower Extremity Assessment: Overall WFL for tasks assessed   Cervical / Trunk Assessment Cervical / Trunk Assessment: Normal (reports verbally a hx of bad  back)   Communication Communication Communication: No difficulties   Cognition Arousal/Alertness: Awake/alert Behavior During Therapy: WFL for tasks assessed/performed Overall Cognitive Status: Within Functional Limits for tasks assessed                     General Comments       Exercises       Shoulder Instructions      Home Living Family/patient expects to be discharged to:: Private residence Living Arrangements: Spouse/significant other Available Help at Discharge: Family;Available 24 hours/day Type of Home: House Home Access: Stairs to enter [employer] of Steps: 3 Entrance Stairs-Rails: Right;Left Home Layout: Two level;Able to live on main level with bedroom/bathroom   Alternate Level Stairs-Rails: Right Bathroom Shower/Tub: Occupational psychologist: Standard     Home Equipment: Environmental consultant - 2 wheels;Bedside commode          Prior Functioning/Environment Level of Independence: Independent             OT Diagnosis: Cognitive deficits;Generalized weakness   OT Problem List: Decreased strength;Decreased activity tolerance;Impaired balance (sitting and/or standing);Decreased cognition;Decreased safety awareness   OT Treatment/Interventions: Self-care/ADL training;Therapeutic exercise;DME and/or AE instruction;Therapeutic activities;Cognitive remediation/compensation;Patient/family education;Balance training    OT Goals(Current goals can be found in the care plan section) Acute Rehab OT Goals Patient Stated Goal: to return to playing golf OT Goal Formulation: With patient Time For Goal Achievement: 07/03/14 Potential to Achieve Goals: Good  OT Frequency: Min 2X/week   Barriers to D/C:            Co-evaluation              End of Session Equipment Utilized During Treatment: Gait belt Nurse Communication: Mobility status;Precautions  Activity Tolerance: Patient tolerated treatment well Patient left: in bed;with call  bell/phone within reach;with bed alarm set;with nursing/sitter in room   Time: 9381-0175 OT Time Calculation (min): 18 min Charges:  OT General Charges $OT Visit: 1 Procedure OT Evaluation $Initial OT Evaluation Tier I: 1 Procedure OT Treatments $Self Care/Home Management : 8-22 mins G-Codes:    Parke Poisson B 2014-07-15, 10:03 AM Pager: [phone]

## 2014-06-19 NOTE — Progress Notes (Signed)
EEG completed; results pending.    

## 2014-06-19 NOTE — Evaluation (Signed)
Physical Therapy Evaluation Patient Details Name: Christopher Baker MRN: [id number] DOB: 1933-06-25 Today's Date: 06/19/2014   History of Present Illness  80 yo male admitted due to confusion and L side weakness. PT with near syncopal episode per wife. CT (+) R MCA  PMH: R MCA, HTN HLD, AFIB CAD    Clinical Impression  Patient presents with balance deficits impacting safe mobility. Demonstrates steady gait without challenges to balance in wide hallways however exhibits mild deviations in gait path and drifting/staggering at times with higher level balance activities. Pt reports this as baseline. Tolerated stair negotiation with supervision for safety. Pt would benefit from skilled PT to improve higher level balance prior to return home so pt can maximize independence and minimize fall risk.    Follow Up Recommendations No PT follow up;Supervision - Intermittent    Equipment Recommendations  None recommended by PT    Recommendations for Other Services       Precautions / Restrictions Precautions Precautions: Fall Restrictions Weight Bearing Restrictions: No      Mobility  Bed Mobility Overal bed mobility: Modified Independent                Transfers Overall transfer level: Needs assistance Equipment used: None Transfers: Sit to/from Stand Sit to Stand: Min guard         General transfer comment: Min guard for safety. Stood from [employer], from toilet x1, from bench x2.  Ambulation/Gait Ambulation/Gait assistance: Supervision Ambulation Distance (Feet): 350 Feet Assistive device: None Gait Pattern/deviations: Step-through pattern;Decreased stride length;Drifts right/left     General Gait Details: Pt with steady gait without challenges. Mild drifting noted with head turns or distractions causing mild deviations in gait. No overt LOB. See balance section for details.  Stairs Stairs: Yes Stairs assistance: Supervision Stair Management: Alternating pattern;One rail  Right Number of Stairs: 11 General stair comments: Supervision for safety. Reports discomfort in lft knee during descent.  Wheelchair Mobility    Modified Rankin (Stroke Patients Only) Modified Rankin (Stroke Patients Only) Pre-Morbid Rankin Score: No symptoms Modified Rankin: Slight disability     Balance Overall balance assessment: Needs assistance Sitting-balance support: Feet supported;No upper extremity supported Sitting balance-Leahy Scale: Good     Standing balance support: During functional activity Standing balance-Leahy Scale: Fair               High level balance activites: Direction changes;Turns;Sudden stops;Head turns High Level Balance Comments: Performed above activities with drifting and mild staggering noted esp with head turns but no overt LOB. During walking around obstacles, pt bumping into door. Standardized Balance Assessment Standardized Balance Assessment : Dynamic Gait Index   Dynamic Gait Index Level Surface: Normal Change in Gait Speed: Normal Gait with Horizontal Head Turns: Mild Impairment Gait with Vertical Head Turns: Normal Gait and Pivot Turn: Normal Step Over Obstacle: Mild Impairment Step Around Obstacles: Mild Impairment Steps: Mild Impairment Total Score: 20       Pertinent Vitals/Pain Pain Assessment: No/denies pain    Home Living Family/patient expects to be discharged to:: Private residence Living Arrangements: Spouse/significant other Available Help at Discharge: Family;Available 24 hours/day Type of Home: House Home Access: Stairs to enter Entrance Stairs-Rails: Psychiatric nurse of Steps: 3 Home Layout: Two level;Able to live on main level with bedroom/bathroom Home Equipment: Gilford Rile - 2 wheels;Bedside commode;Cane - single point      Prior Function Level of Independence: Independent               Hand  Dominance   Dominant Hand: Right    Extremity/Trunk Assessment   Upper Extremity  Assessment: Defer to OT evaluation;Overall WFL for tasks assessed           Lower Extremity Assessment: Overall WFL for tasks assessed      Cervical / Trunk Assessment: Normal  Communication   Communication: No difficulties  Cognition Arousal/Alertness: Awake/alert Behavior During Therapy: WFL for tasks assessed/performed Overall Cognitive Status: Within Functional Limits for tasks assessed                      General Comments General comments (skin integrity, edema, etc.): Noted to have pitting edema Bil feet- reports as premorbid.    Exercises        Assessment/Plan    PT Assessment Patient needs continued PT services  PT Diagnosis Difficulty walking   PT Problem List Decreased balance;Decreased safety awareness;Decreased activity tolerance  PT Treatment Interventions Balance training;Gait training;Patient/family education;Therapeutic exercise;Therapeutic activities   PT Goals (Current goals can be found in the Care Plan section) Acute Rehab PT Goals Patient Stated Goal: to return home today PT Goal Formulation: With patient Time For Goal Achievement: 07/03/14 Potential to Achieve Goals: Good    Frequency Min 3X/week   Barriers to discharge        Co-evaluation               End of Session Equipment Utilized During Treatment: Gait belt Activity Tolerance: Patient tolerated treatment well Patient left: in bed;with call bell/phone within reach;with bed alarm set Nurse Communication: Mobility status         Time: 1771-1657 PT Time Calculation (min) (ACUTE ONLY): 17 min   Charges:   PT Evaluation $PT Re-evaluation: 1 Procedure PT Treatments $Gait Training: 8-22 mins   PT G CodesCandy Baker A 07-08-2014, 4:42 PM  Christopher Baker, PT, DPT [phone]

## 2014-06-20 ENCOUNTER — Inpatient Hospital Stay (HOSPITAL_COMMUNITY): Payer: Medicare HMO

## 2014-06-20 DIAGNOSIS — I480 Paroxysmal atrial fibrillation: Secondary | ICD-10-CM

## 2014-06-20 LAB — GLUCOSE, CAPILLARY
Glucose-Capillary: 106 mg/dL — ABNORMAL HIGH (ref 70–99)
Glucose-Capillary: 97 mg/dL (ref 70–99)

## 2014-06-20 MED ORDER — LISINOPRIL 10 MG PO TABS: 10.0000 mg | ORAL_TABLET | Freq: Every day | ORAL | Status: DC

## 2014-06-20 MED ORDER — LEVETIRACETAM 500 MG PO TABS: 250.0000 mg | ORAL_TABLET | Freq: Two times a day (BID) | ORAL | Status: DC

## 2014-06-20 MED ORDER — LEVETIRACETAM 500 MG PO TABS: 500.0000 mg | ORAL_TABLET | Freq: Two times a day (BID) | ORAL | Status: DC

## 2014-06-20 MED ORDER — LISINOPRIL 10 MG PO TABS
10.0000 mg | ORAL_TABLET | Freq: Every day | ORAL | Status: DC
Start: 2014-06-20 — End: 2014-06-20

## 2014-06-20 MED ORDER — LEVETIRACETAM 250 MG PO TABS: 250.0000 mg | ORAL_TABLET | Freq: Two times a day (BID) | ORAL | Status: DC

## 2014-06-20 NOTE — Discharge Summary (Signed)
Physician Discharge Summary  Christopher Baker PYP:[id number] DOB: January 24, 1934 DOA: 06/18/2014  PCP: Donia Ast, FNP  Admit date: 06/18/2014 Discharge date: 06/20/2014  Time spent: 35 minutes  Recommendations for Outpatient Follow-up:  1. Needs to follow up with primary cardiologist for further evaluation of cardiomyopathy.  2. Adjust BP medication as needed. Patient with history of orthostatic hypotension  Discharge Diagnoses:    Partial seizures   Essential hypertension   FIBRILLATION, ATRIAL   Pacemaker-[employer]   Methotrexate, long term, current use   TIA (transient ischemic attack)   Vitamin B12 deficiency   Discharge Condition: stable.   Diet recommendation: heart healthy  Filed Weights   06/18/14 1146  Weight: 102.377 kg (225 lb 11.2 oz)    History of present illness:  Christopher NEVILLS is a 80 y.o. male with Past medical history of hypertension, PVD, dyslipidemia, PPM implant for syncope, PAF . The pt has been presented with left-sided weakness. History was obtained from patient's wife. Today when they were having a dinner outside the patient started having difficulty remembering his regular order as well as today's date. He also started having left-sided droopiness when the wife asked him to smile. But he was still able to stick out his tongue straight. After reaching home he started having difficulty laying a simple games that he has been playing for many years. He was unable to recall the date and month. Apparently after that the patient become lethargic and slumped over and fell asleep. He continues to have a left facial droop. The speech was not slurred and there was no left-sided weakness. At the time of his evaluation patient symptoms completely resolved as per wife. 66 ago patient was taken off his blood pressure medication. Patient was initially on amlodipine and lisinopril, initially amlodipine and later on lisinopril was taken off off his list due to  hypotension. Today after reaching home wife found the patient's blood pressure was significantly elevated 170/70.  Hospital Course:  1-Confusion, AMS:  TIA vs Seizure.  Started on Keppra. Neurology recommend to continue with keppra.  Patient notice to be different today by wife, he is more angry. He was also unsteady and had lack of awareness to hygiene concerning.  Unable to do MRI due to pacemaker.  Neurology following.  EEG negative. Marland Kitchen  ECHO; mildly decrease EF and doppler 40 % stenosis. .  Repeated Ct head showed old stroke.  Back to baseline.  Discharge today after seen by neurology.   2-History of A fib on Xarelto.   3-B 12 deficiency:  Received B 12 injection.   4-HTN; permissive HTN. Resume low dose lisinopril.   5-Arthritis, psoriasis: on prednisone.  6-Cardiomyopathy:  Ef 45 to 50 %. Patient denies dyspnea, chest pain. Resume lisinopril. Needs follow up with primary cardiologist.   Procedures:  ECHO; Left ventricle: The cavity size was normal. Wall thickness was normal. Systolic function was mildly reduced. The estimated ejection fraction was in the range of 45% to 50%. Wall motion was normal; there were no regional wall motion abnormalities  Doppler; The vertebral arteries appear patent with antegrade flow. - Findings consistent with 60 - 37 percent stenosis involving the right internal carotid artery and the left internal carotid artery.  Consultations:  neurology  Discharge Exam: Filed Vitals:   06/20/14 0933  BP: 155/58  Pulse: 62  Temp: 98.1 F (36.7 C)  Resp: 18    General: Alert in no distress.  Cardiovascular: S 1, S 2 RRR Respiratory: cta  Discharge  Instructions   Discharge Instructions    Diet - low sodium heart healthy    Complete by:  As directed      Increase activity slowly    Complete by:  As directed           Current Discharge Medication List    START taking these medications   Details  levETIRAcetam  (KEPPRA) 500 MG tablet Take 1 tablet (500 mg total) by mouth 2 (two) times daily. Qty: 60 tablet, Refills: 0      CONTINUE these medications which have CHANGED   Details  lisinopril (PRINIVIL,ZESTRIL) 10 MG tablet Take 1 tablet (10 mg total) by mouth daily. Qty: 30 tablet, Refills: 0      CONTINUE these medications which have NOT CHANGED   Details  citalopram (CELEXA) 20 MG tablet Take 1.5 tablets (30 mg total) by mouth daily. Qty: 135 tablet, Refills: 1    clotrimazole-betamethasone (LOTRISONE) cream Apply 1 application topically 2 (two) times daily as needed (psoriasis).    Cyanocobalamin (VITAMIN B-12 IJ) Inject 3 mLs as directed every 14 (fourteen) days. Every two weeks    ezetimibe (ZETIA) 10 MG tablet Take 1 tablet (10 mg total) by mouth daily. Qty: 90 tablet, Refills: 3    gabapentin (NEURONTIN) 300 MG capsule Take 300 mg by mouth at bedtime.    ipratropium (ATROVENT) 0.06 % nasal spray Place 2 sprays into both nostrils 4 (four) times daily. Qty: 45 mL, Refills: 3    iron polysaccharides (NIFEREX) 150 MG capsule Take 150 mg by mouth 2 (two) times daily.    methotrexate (RHEUMATREX) 2.5 MG tablet Take 10 mg by mouth once a week. Caution:Chemotherapy. Protect from light. Every monday    mirabegron ER (MYRBETRIQ) 50 MG TB24 tablet Take 1 tablet (50 mg total) by mouth daily. Qty: 90 tablet, Refills: 3    pantoprazole (PROTONIX) 40 MG tablet Take 1 tablet (40 mg total) by mouth daily. Qty: 90 tablet, Refills: 3   Associated Diagnoses: GERD (gastroesophageal reflux disease)    predniSONE (DELTASONE) 5 MG tablet Take 1.5 tablets (7.5 mg total) by mouth daily. Qty: 135 tablet, Refills: 1    Rivaroxaban (XARELTO) 20 MG TABS tablet Take 1 tablet (20 mg total) by mouth daily with supper. This is the new medication for stroke prevention Qty: 90 tablet, Refills: 3    rOPINIRole (REQUIP) 2 MG tablet Take 1 tablet (2 mg total) by mouth at bedtime. Qty: 90 tablet, Refills: 3     solifenacin (VESICARE) 10 MG tablet Take 1 tablet (10 mg total) by mouth daily. Use vesicare or ditropan--not both Qty: 90 tablet, Refills: 3    triamcinolone cream (KENALOG) 0.1 % Apply to affected area as directed Qty: 480 g, Refills: 0    atorvastatin (LIPITOR) 20 MG tablet Take 20 mg by mouth daily.       Allergies  Allergen Reactions  . Ciprofloxacin Nausea Only    REACTION: GI upset  . Other Other (See Comments)    SCALLOPS--SUDDEN GI ATTACK  . Hydrocodone-Acetaminophen     Hallucinations in higher doses   Follow-up Information    Follow up with CAMPBELL, [place], FNP In 1 week.   Specialty:  Family Medicine   Contact information:   [place] [address] [phone]       Follow up with Cristopher Peru, MD In 1 week.   Specialty:  Cardiology   Contact information:   [address] [place]  [address] [phone]       Please follow up.   Why:  please follow up with your neurologist in 1-2 weeks.        The results of significant diagnostics from this hospitalization (including imaging, microbiology, ancillary and laboratory) are listed below for reference.    Significant Diagnostic Studies: Dg Chest 2 View  06/18/2014   CLINICAL DATA:  Confusion.  EXAM: CHEST  2 VIEW  COMPARISON:  03/02/2014  FINDINGS: There is a dual-chamber pacer from the left; lead orientation is unchanged. Normal heart size and mediastinal contours. No acute infiltrate or edema. No effusion or pneumothorax. No acute osseous findings.  IMPRESSION: No active cardiopulmonary disease.   Electronically Signed   By: Jorje Guild M.D.   On: 06/18/2014 04:09   Ct Head Wo Contrast  06/20/2014   CLINICAL DATA:  Intermittent bouts of confusion.  EXAM: CT HEAD WITHOUT CONTRAST  TECHNIQUE: Contiguous axial images were obtained from the base of the skull through the vertex without intravenous contrast.  COMPARISON:  06/18/2014.  12/31/2013.  FINDINGS: The  brain shows generalized atrophy. There is old right parietal cortical and subcortical infarction. There is old infarction in the right basal ganglia. No sign of acute infarction, mass lesion, hemorrhage, hydrocephalus or extra-axial collection. No calvarial abnormality. Sinuses, middle ears and mastoids do not show any significant disease.  IMPRESSION: Brain atrophy. Old right basal ganglia and right parietal strokes. No acute finding.   Electronically Signed   By: Nelson Chimes M.D.   On: 06/20/2014 07:50   Ct Head (brain) Wo Contrast  06/18/2014   CLINICAL DATA:  Left-sided facial droop with subsequent resolution. Altered mental status.  EXAM: CT HEAD WITHOUT CONTRAST  TECHNIQUE: Contiguous axial images were obtained from the base of the skull through the vertex without intravenous contrast.  COMPARISON:  12/31/2013  FINDINGS: Skull and Sinuses:No acute fracture or destructive process.  There is chronic appearing mild mucosal thickening in the bilateral paranasal sinuses. Chronic but notable irregularly-shaped polypoid structure from the anterior left maxillary sinus.  Orbits: No acute abnormality.  Brain: No evidence of acute infarction, hemorrhage, hydrocephalus, or mass lesion/mass effect.  Remote perforator infarct involving the right caudate head and anterior putamen (with intervening white matter). Remote small cortical and subcortical infarct in the right parietal lobe, extending towards the right lateral ventricle. There is mild patchy bilateral small-vessel ischemic gliosis in the cerebral white matter.  IMPRESSION: 1. No acute intracranial findings. 2. Small remote infarcts in the right MCA territory.   Electronically Signed   By: Jorje Guild M.D.   On: 06/18/2014 04:02    Microbiology: No results found for this or any previous visit (from the past 240 hour(s)).   Labs: Basic Metabolic Panel:  Recent Labs Lab 06/18/14 0011 06/19/14 0815  NA 143 141  K 4.4 4.2  CL 110 112  CO2 27 26   GLUCOSE 134* 114*  BUN 19 14  CREATININE 1.07 1.03  CALCIUM 8.9 9.1   Liver Function Tests:  Recent Labs Lab 06/18/14 0011  AST 22  ALT 24  ALKPHOS 68  BILITOT 0.3  PROT 5.5*  ALBUMIN 3.2*   No results for input(s): LIPASE, AMYLASE in the last 168 hours. No results for input(s): AMMONIA in the last 168 hours. CBC:  Recent Labs Lab 06/18/14 0011  WBC 7.6  NEUTROABS 5.7  HGB 11.1*  HCT 36.0*  MCV 87.8  PLT 183   Cardiac Enzymes: No results for input(s): CKTOTAL, CKMB, CKMBINDEX,  TROPONINI in the last 168 hours. BNP: BNP (last 3 results) No results for input(s): PROBNP in the last 8760 hours. CBG:  Recent Labs Lab 06/19/14 1129 06/19/14 1646 06/19/14 2152 06/20/14 0640 06/20/14 1130  GLUCAP 94 115* 108* 106* 97       Signed:  Sydnei Ohaver A  [facility] 06/20/2014, 1:42 PM

## 2014-06-20 NOTE — Progress Notes (Signed)
Pt discharged with his wife. Stable. Neuro intact.  IV discontinued. Discharge education and instructions provided with prescriptions with verbal understanding aware of follow up appts. Pt took all personal belongs home. No noted distress or complaints of pain or discomfort.

## 2014-06-20 NOTE — Progress Notes (Signed)
NEURO HOSPITALIST PROGRESS NOTE   SUBJECTIVE:                                                                                                                        Doing well. Wife is at the bedside and said that he is now back to baseline. No further spells of confusion.  Repeat CT brain today unchanged, no acute abnormality. EEG normal. On keppra 500 mg BID. Lipid profile: cholesterol 146, triglycerides 64, LDL 78, HDL 55 CUS without hemodynamically significant carotid disease. TTE unimpressive. OBJECTIVE:                                                                                                                           Vital signs in last 24 hours: Temp:  [97.9 F (36.6 C)-98.8 F (37.1 C)] 98.1 F (36.7 C) (12/31 0933) Pulse Rate:  [59-68] 62 (12/31 0933) Resp:  [18] 18 (12/31 0933) BP: (132-157)/(45-72) 155/58 mmHg (12/31 0933) SpO2:  [97 %-99 %] 97 % (12/31 0933)  Intake/Output from previous day: 12/30 0701 - 12/31 0700 In: 600 [P.O.:600] Out: 525 [Urine:525] Intake/Output this shift: Total I/O In: 360 [P.O.:360] Out: -  Nutritional status: Diet heart healthy/carb modified Diet - low sodium heart healthy  Past Medical History  Diagnosis Date  . Hyperlipidemia   . HTN (hypertension)   . Status post placement of cardiac pacemaker DDD-  06-04-10-- LAST CHECK 09-08-10 IN EPIC    SECONDARY TO SYNCOPY AND BRADYCARDIA  . Restless leg syndrome   . Normal nuclear stress test 2008    LOW RISK--   DR  WALL  . Carotid stenosis, bilateral PER DR EARLY / DUPLEX  09-09-10      40 - 59%   BILATERALLY--- ASYMPTOMATIC  . History of echocardiogram 11-21-2006    EF 65%  . History of prostate cancer S/P RADIATION  TX  6 YRS AGO  . Psoriasis   . Arthritis with psoriasis   . GERD (gastroesophageal reflux disease)     CONTROLLED W/ PROTONIX  . Borderline diabetes     DIET CONTROLLED - PT STATES HE IS NOT DIABETIC  . Lumbar spondylosis  W/ RADICULOPATHY  . Iron deficiency   . Cataract immature LEFT EYE  .  PAF (paroxysmal atrial fibrillation)   . SVT (supraventricular tachycardia)     S/P ABLATION   2008  . Coronary artery disease CARDIOLOGIST- DR Crissie Sickles    09-08-10 VISIT AND PACEMAKER CHECK  IN EPIC  . Cancer     HX OF PROSTATE CANCER--TX'D WITH RADIATION  . Sleep apnea TESTED YRS AGO-- NO CPAP RX GIVEN    PT STATES HE DOES NOT THINK HE STILL HAS SLEEP APNEA--DOES NOT USE CPAP  . Stroke Oct. 14, 2014  Physical exam: pleasant male in no apparent distress. Head: normocephalic. Neck: supple, no bruits, no JVD. Cardiac: no murmurs. Lungs: clear. Abdomen: soft, no tender, no mass. Extremities: no edema. Skin: no rash  Neurologic Exam:  Mental Status: Alert, oriented, thought content appropriate. Speech fluent without evidence of aphasia. Able to follow commands without difficulty. Cranial Nerves: II-Visual fields were normal. III/IV/VI-Pupils were equal and reacted. Extraocular movements were full and conjugate.   V/VII-no facial numbness and no facial weakness. VIII-normal. X-normal speech and symmetrical palatal movement. Motor: 5/5 bilaterally with normal tone and bulk Sensory: Normal throughout. Deep Tendon Reflexes: 1+ and symmetric. Plantars: Mute bilaterally Cerebellar: Normal finger-to-nose testing. Gait: no tested due to safety reasons.  Lab Results: Lab Results  Component Value Date/Time   CHOL 146 06/19/2014 08:15 AM   Lipid Panel  Recent Labs  06/19/14 0815  CHOL 146  TRIG 64  HDL 55  CHOLHDL 2.7  VLDL 13  LDLCALC 78    Studies/Results: Ct Head Wo Contrast  06/20/2014   CLINICAL DATA:  Intermittent bouts of confusion.  EXAM: CT HEAD WITHOUT CONTRAST  TECHNIQUE: Contiguous axial images were obtained from the base of the skull through the vertex without intravenous contrast.  COMPARISON:  06/18/2014.  12/31/2013.  FINDINGS: The brain shows generalized atrophy. There is old  right parietal cortical and subcortical infarction. There is old infarction in the right basal ganglia. No sign of acute infarction, mass lesion, hemorrhage, hydrocephalus or extra-axial collection. No calvarial abnormality. Sinuses, middle ears and mastoids do not show any significant disease.  IMPRESSION: Brain atrophy. Old right basal ganglia and right parietal strokes. No acute finding.   Electronically Signed   By: Nelson Chimes M.D.   On: 06/20/2014 07:50    MEDICATIONS                                                                                                                        Scheduled: . darifenacin  15 mg Oral Daily  . ezetimibe  10 mg Oral Daily  . gabapentin  300 mg Oral QHS  . iron polysaccharides  150 mg Oral BID  . levETIRAcetam  500 mg Intravenous BID  . lisinopril  10 mg Oral Daily  . mirabegron ER  50 mg Oral Daily  . pantoprazole  40 mg Oral Daily  . predniSONE  7.5 mg Oral Q breakfast  . rivaroxaban  20 mg Oral Q supper  . rOPINIRole  2 mg Oral QHS  ASSESSMENT/PLAN:                                                                                                           80 year old man with multiple medical problems including previous right MCA strokes 14 months ago, admitted after sustaining a transient episode of confusion while eating dinner at a [employer] with his wife. Neuro work up unrevealing. I had an ample conversation with patient and wife regarding the episode that prompted his admission to the hospital, and based upon her description of the event I am more inclined to believe that patient had a TIA and less likely a focal seizure, as the wife said that he was confused for almost one hour and was aware of his environment throughout the event. He has been having short tem memory difficulty since his stroke and can not entirely exclude vascular cognitive impairment. In any case, I don't feel strongly about maintaining him long term on keppra, but  advised to continue low dose keppra (250-500 mg BID) until he sees his neurologist in couple of weeks who will determine if further neurological testing like ambulatory 24 h  EEG or neuropsychological testing are prudent. From a neuro standpoint patient can go home today.     Dorian Pod, MD [facility] [phone]  06/20/2014, 1:37 PM

## 2014-06-20 NOTE — Progress Notes (Signed)
PT Cancellation Note  Patient Details Name: Christopher Baker MRN: [id number] DOB: 01-17-1934   Cancelled Treatment:    Reason Eval/Treat Not Completed: Patient declined, no reason specified Upon entering room, pt saturated in urine in pajamas. Demanding a shower but not allowing therapist to assist with clean up/changing. RN notified to send tech in to assist with clean up. Pt declined any mobility until having a shower.   Candy Sledge A 06/20/2014, 1:19 PM Candy Sledge, [place], DPT [phone]

## 2014-06-25 ENCOUNTER — Other Ambulatory Visit: Payer: Self-pay | Admitting: Internal Medicine

## 2014-06-25 ENCOUNTER — Ambulatory Visit (INDEPENDENT_AMBULATORY_CARE_PROVIDER_SITE_OTHER): Payer: PPO | Admitting: Internal Medicine

## 2014-06-25 ENCOUNTER — Encounter: Payer: Commercial Managed Care - HMO | Admitting: *Deleted

## 2014-06-25 ENCOUNTER — Ambulatory Visit (HOSPITAL_BASED_OUTPATIENT_CLINIC_OR_DEPARTMENT_OTHER): Payer: PPO | Admitting: Physical Medicine & Rehabilitation

## 2014-06-25 ENCOUNTER — Encounter: Payer: Self-pay | Admitting: Internal Medicine

## 2014-06-25 ENCOUNTER — Encounter: Payer: PPO | Attending: Physical Medicine & Rehabilitation

## 2014-06-25 ENCOUNTER — Telehealth: Payer: Self-pay | Admitting: Cardiology

## 2014-06-25 ENCOUNTER — Encounter: Payer: Self-pay | Admitting: Physical Medicine & Rehabilitation

## 2014-06-25 VITALS — BP 143/70 | HR 72 | Resp 14

## 2014-06-25 VITALS — BP 126/62 | HR 87 | Ht 75.5 in | Wt 225.0 lb

## 2014-06-25 DIAGNOSIS — I480 Paroxysmal atrial fibrillation: Secondary | ICD-10-CM

## 2014-06-25 DIAGNOSIS — I6991 Cognitive deficits following unspecified cerebrovascular disease: Secondary | ICD-10-CM | POA: Diagnosis present

## 2014-06-25 DIAGNOSIS — I10 Essential (primary) hypertension: Secondary | ICD-10-CM

## 2014-06-25 DIAGNOSIS — I69919 Unspecified symptoms and signs involving cognitive functions following unspecified cerebrovascular disease: Secondary | ICD-10-CM

## 2014-06-25 DIAGNOSIS — Z8673 Personal history of transient ischemic attack (TIA), and cerebral infarction without residual deficits: Secondary | ICD-10-CM | POA: Diagnosis not present

## 2014-06-25 DIAGNOSIS — Z950 Presence of cardiac pacemaker: Secondary | ICD-10-CM

## 2014-06-25 DIAGNOSIS — I6340 Cerebral infarction due to embolism of unspecified cerebral artery: Secondary | ICD-10-CM

## 2014-06-25 LAB — MDC_IDC_ENUM_SESS_TYPE_INCLINIC
Battery Remaining Longevity: 128 mo
Battery Voltage: 2.79 V
Brady Statistic AP VS Percent: 29 %
Brady Statistic AS VP Percent: 1 %
Brady Statistic AS VS Percent: 65 %
Date Time Interrogation Session: 20160105161201
Lead Channel Impedance Value: 395 Ohm
Lead Channel Impedance Value: 443 Ohm
Lead Channel Pacing Threshold Amplitude: 0.75 V
Lead Channel Pacing Threshold Pulse Width: 0.4 ms
Lead Channel Sensing Intrinsic Amplitude: 4 mV
Lead Channel Sensing Intrinsic Amplitude: 5.6 mV
Lead Channel Setting Pacing Amplitude: 2 V
MDC IDC MSMT BATTERY IMPEDANCE: 204 Ohm
MDC IDC MSMT LEADCHNL RV PACING THRESHOLD AMPLITUDE: 1.25 V
MDC IDC MSMT LEADCHNL RV PACING THRESHOLD PULSEWIDTH: 0.4 ms
MDC IDC SET LEADCHNL RV PACING AMPLITUDE: 2.5 V
MDC IDC SET LEADCHNL RV PACING PULSEWIDTH: 0.4 ms
MDC IDC SET LEADCHNL RV SENSING SENSITIVITY: 2.8 mV
MDC IDC STAT BRADY AP VP PERCENT: 4 %

## 2014-06-25 NOTE — Assessment & Plan Note (Signed)
He is maintaining NSR over 99% of the time. He will continue his current meds.

## 2014-06-25 NOTE — Assessment & Plan Note (Signed)
His blood pressure is well controlled. He will continue his current meds and maintain a low sodium diet. 

## 2014-06-25 NOTE — Patient Instructions (Addendum)
Your physician wants you to follow-up in: 12 months with Dr Knox Saliva will receive a reminder letter in the mail two months in advance. If you don't receive a letter, please call our office to schedule the follow-up appointment.    Remote monitoring is used to monitor your Pacemaker of ICD from home. This monitoring reduces the number of office visits required to check your device to one time per year. It allows [place] to keep an eye on the functioning of your device to ensure it is working properly. You are scheduled for a device check from home on 09/24/14. You may send your transmission at any time that day. If you have a wireless device, the transmission will be sent automatically. After your physician reviews your transmission, you will receive a postcard with your next transmission date.

## 2014-06-25 NOTE — Assessment & Plan Note (Signed)
His Medtronic dual-chamber pacemaker is working normally. We'll plan to recheck in several months.

## 2014-06-25 NOTE — Progress Notes (Signed)
HPI Mr. Bielinski returns today for followup. He is a very pleasant 81 year old man with a history of unexplained syncope, symptomatic bradycardia, status post permanent pacemaker insertion, and hypertension. He has had a stroke as he previously had refused anti-coagulation despite his documented PAF.   No syncope. He had a TIA several weeks ago. He admits to occaisionally missing his medications including Xarelto. He has also had uncontrolled HTN. He has had some chronic lower extremity edema but none in the past few days. Allergies  Allergen Reactions  . Ciprofloxacin Nausea Only    REACTION: GI upset  . Hydrocodone-Acetaminophen     Hallucinations in higher doses  . Other Other (See Comments)    SCALLOPS--SUDDEN GI ATTACK     Current Outpatient Prescriptions  Medication Sig Dispense Refill  . atorvastatin (LIPITOR) 20 MG tablet Take 20 mg by mouth daily.    . citalopram (CELEXA) 20 MG tablet Take 1.5 tablets (30 mg total) by mouth daily. 135 tablet 1  . clotrimazole-betamethasone (LOTRISONE) cream Apply 1 application topically 2 (two) times daily as needed (psoriasis).    . Cyanocobalamin (VITAMIN B-12 IJ) Inject 3 mLs as directed every 14 (fourteen) days. Every two weeks    . ezetimibe (ZETIA) 10 MG tablet Take 1 tablet (10 mg total) by mouth daily. 90 tablet 3  . gabapentin (NEURONTIN) 300 MG capsule Take 300 mg by mouth at bedtime.    Marland Kitchen ipratropium (ATROVENT) 0.06 % nasal spray Place 2 sprays into both nostrils 4 (four) times daily. (Patient taking differently: Place 2 sprays into both nostrils 4 (four) times daily as needed for rhinitis. ) 45 mL 3  . iron polysaccharides (NIFEREX) 150 MG capsule Take 150 mg by mouth 2 (two) times daily.    Marland Kitchen levETIRAcetam (KEPPRA) 250 MG tablet Take 1 tablet (250 mg total) by mouth 2 (two) times daily. (Patient taking differently: Take 250 mg by mouth at bedtime. ) 60 tablet 0  . lisinopril (PRINIVIL,ZESTRIL) 10 MG tablet Take 1 tablet (10 mg total) by  mouth daily. 30 tablet 0  . methotrexate (RHEUMATREX) 2.5 MG tablet Take 10 mg by mouth once a week. Caution:Chemotherapy. Protect from light. Every monday    . mirabegron ER (MYRBETRIQ) 50 MG TB24 tablet Take 1 tablet (50 mg total) by mouth daily. 90 tablet 3  . pantoprazole (PROTONIX) 40 MG tablet Take 1 tablet (40 mg total) by mouth daily. 90 tablet 3  . predniSONE (DELTASONE) 5 MG tablet Take 1.5 tablets (7.5 mg total) by mouth daily. 135 tablet 1  . Rivaroxaban (XARELTO) 20 MG TABS tablet Take 1 tablet (20 mg total) by mouth daily with supper. This is the new medication for stroke prevention 90 tablet 3  . rOPINIRole (REQUIP) 2 MG tablet Take 1 tablet (2 mg total) by mouth at bedtime. 90 tablet 3  . solifenacin (VESICARE) 10 MG tablet Take 1 tablet (10 mg total) by mouth daily. Use vesicare or ditropan--not both 90 tablet 3  . triamcinolone cream (KENALOG) 0.1 % Apply to affected area as directed (Patient taking differently: Apply 1 application topically daily as needed (rash). Apply to affected area as directed) 480 g 0   No current facility-administered medications for this visit.     Past Medical History  Diagnosis Date  . Hyperlipidemia   . HTN (hypertension)   . Status post placement of cardiac pacemaker DDD-  06-04-10-- LAST CHECK 09-08-10 IN EPIC    SECONDARY TO SYNCOPY AND BRADYCARDIA  . Restless leg syndrome   .  Normal nuclear stress test 2008    LOW RISK--   DR  WALL  . Carotid stenosis, bilateral PER DR EARLY / DUPLEX  09-09-10      40 - 59%   BILATERALLY--- ASYMPTOMATIC  . History of echocardiogram 11-21-2006    EF 65%  . History of prostate cancer S/P RADIATION  TX  6 YRS AGO  . Psoriasis   . Arthritis with psoriasis   . GERD (gastroesophageal reflux disease)     CONTROLLED W/ PROTONIX  . Borderline diabetes     DIET CONTROLLED - PT STATES HE IS NOT DIABETIC  . Lumbar spondylosis W/ RADICULOPATHY  . Iron deficiency   . Cataract immature LEFT EYE  . PAF  (paroxysmal atrial fibrillation)   . SVT (supraventricular tachycardia)     S/P ABLATION   2008  . Coronary artery disease CARDIOLOGIST- DR Crissie Sickles    09-08-10 VISIT AND PACEMAKER CHECK  IN EPIC  . Cancer     HX OF PROSTATE CANCER--TX'D WITH RADIATION  . Sleep apnea TESTED YRS AGO-- NO CPAP RX GIVEN    PT STATES HE DOES NOT THINK HE STILL HAS SLEEP APNEA--DOES NOT USE CPAP  . Stroke Oct. 14, 2014    ROS:   All systems reviewed and negative except as noted in the HPI.   Past Surgical History  Procedure Laterality Date  . Replacement total knee  1997    RIGHT  . Cardiac pacemaker placement  06-04-10    DDD/ AND REMOVAL LOOR RECORDER  . Loop recorder placement  01-30-10  . Lumbar laminectomy/ diskectomy/ fusion  05-19-10    L4 - 5  . Cholecystectomy  2009  . Cardiac electrophysiology study and ablation  2008    FOR SVT  . Prostate biopsy  2007  . Transurethral resection of prostate  2006  . Inguinal hernia repair  2005    LEFT/   DONE WITH PENILE PROSTESIS SURG.  . Removal and placement penile prosthesis implant   2005    AND LEFT CORPOROPLASTY /    DONE INGUINAL REPAIR  . Penile prosthesis implant  1995  . Knee arthroscopy  06/02/2011    Procedure: ARTHROSCOPY KNEE;  Surgeon: Gearlean Alf;  Location: [facility];  Service: Orthopedics;  Laterality: Left;  LEFT KNEE ARTHROSCOPY WITH DEBRIDEMENT  . Total knee arthroplasty  12/20/2011    Procedure: TOTAL KNEE ARTHROPLASTY;  Surgeon: Gearlean Alf, MD;  Location: [facility];  Service: Orthopedics;  Laterality: Left;  . Back surgery    . Joint replacement    . Spine surgery       Family History  Problem Relation Age of Onset  . Stroke Mother   . Early death Father      History   Social History  . Marital Status: Married    Spouse Name: N/A    Number of Children: N/A  . Years of Education: N/A   Occupational History  . retired    Social History Main Topics  . Smoking status: Former Smoker --  0.10 packs/day for 55 years    Types: Cigarettes    Quit date: 12/07/1998  . Smokeless tobacco: Never Used  . Alcohol Use: Yes     Comment: RARE  . Drug Use: No  . Sexual Activity:    Partners: Female   Other Topics Concern  . Not on file   Social History Narrative     BP 126/62 mmHg  Pulse 87  Ht  6' 3.5" (1.918 m)  Wt 225 lb (102.059 kg)  BMI 27.74 kg/m2  Physical Exam:  Well appearing 81 yo man, NAD HEENT: Unremarkable Neck:  7 cm JVD, no thyromegally Back:  No CVA tenderness Lungs:  Clear with no wheezes, well healed PPM incision. HEART:  Regular rate rhythm, no murmurs, no rubs, no clicks Abd:  soft, positive bowel sounds, no organomegally, no rebound, no guarding Ext:  2 plus pulses, no edema, no cyanosis, no clubbing Skin:  No rashes no nodules Neuro:  CN II through XII intact, motor grossly intact  DEVICE  Normal device function.  See PaceArt for details.   Assess/Plan:

## 2014-06-25 NOTE — Telephone Encounter (Signed)
Pt will have PPM checked during his post hosp appt w/ MD.

## 2014-06-25 NOTE — Patient Instructions (Signed)
From the hospital records that I have reviewed it appears that the episode on 12/28 was likely a TIA. You should discuss this further with your neurologist. Given that you're having some drowsiness as well as falling asleep during the day and feeling of mental cloudiness would stop the morning dose of Keppra. It is unlikely that your episode was a seizure  You may stop your nighttime dose of gabapentin for shingles pain and itching. If it recurs you may restart

## 2014-06-25 NOTE — Progress Notes (Signed)
Subjective:    Patient ID: Christopher Baker, male    DOB: [birth year], 81 y.o.   MRN: [id number]  HPI Driving independantly not much night time or interstate driving. Had an episode of confusion while out to eat, had left facial Weakness as well as difficulty with speech and increased confusion. Wife drove patient home took his blood pressure which is elevated then went down to the fire department to get it checked. It was still elevated and then they took him to the emergency room at [facility] on 06/17/2014. His wife did not note any weakness on the left side during that episode. No falls, was able to ambulate This episode lasted for about 1 hour, Patient was responsive during this episode, no shaking Hospitalized 12/28-12/31 Last Neuro note indicates work up for CVA and seizure negative, feeling is that episode was probably TIA.   Pain Inventory Average Pain 1 Pain Right Now 1 My pain is dull and aching  In the last 24 hours, has pain interfered with the following? General activity 0 Relation with others 0 Enjoyment of life 1 What TIME of day is your pain at its worst? morning Sleep (in general) Fair  Pain is worse with: some activites Pain improves with: heat/ice, therapy/exercise and medication Relief from Meds: n/a  Mobility walk without assistance how many minutes can you walk? 3 ability to climb steps?  yes do you drive?  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  yes  hosp  Physicians involved in your care Any changes since last visit?  no   Family History  Problem Relation Age of Onset  . Stroke Mother   . Early death Father    History   Social History  . Marital Status: Married    Spouse Name: N/A    Number of Children: N/A  . Years of Education: N/A   Occupational History  . retired    Social History Main Topics  . Smoking status: Former Smoker  -- 0.10 packs/day for 55 years    Types: Cigarettes    Quit date: 12/07/1998  . Smokeless tobacco: Never Used  . Alcohol Use: Yes     Comment: RARE  . Drug Use: No  . Sexual Activity:    Partners: Female   Other Topics Concern  . None   Social History Narrative   Past Surgical History  Procedure Laterality Date  . Replacement total knee  1997    RIGHT  . Cardiac pacemaker placement  06-04-10    DDD/ AND REMOVAL LOOR RECORDER  . Loop recorder placement  01-30-10  . Lumbar laminectomy/ diskectomy/ fusion  05-19-10    L4 - 5  . Cholecystectomy  2009  . Cardiac electrophysiology study and ablation  2008    FOR SVT  . Prostate biopsy  2007  . Transurethral resection of prostate  2006  . Inguinal hernia repair  2005    LEFT/   DONE WITH PENILE PROSTESIS SURG.  . Removal and placement penile prosthesis implant   2005    AND LEFT CORPOROPLASTY /    DONE INGUINAL REPAIR  . Penile prosthesis implant  1995  . Knee arthroscopy  06/02/2011    Procedure: ARTHROSCOPY KNEE;  Surgeon: Gearlean Alf;  Location: [place];  Service: Orthopedics;  Laterality: Left;  LEFT KNEE ARTHROSCOPY WITH DEBRIDEMENT  . Total knee arthroplasty  12/20/2011  Procedure: TOTAL KNEE ARTHROPLASTY;  Surgeon: Gearlean Alf, MD;  Location: [facility];  Service: Orthopedics;  Laterality: Left;  . Back surgery    . Joint replacement    . Spine surgery     Past Medical History  Diagnosis Date  . Hyperlipidemia   . HTN (hypertension)   . Status post placement of cardiac pacemaker DDD-  06-04-10-- LAST CHECK 09-08-10 IN EPIC    SECONDARY TO SYNCOPY AND BRADYCARDIA  . Restless leg syndrome   . Normal nuclear stress test 2008    LOW RISK--   DR  WALL  . Carotid stenosis, bilateral PER DR EARLY / DUPLEX  09-09-10      40 - 59%   BILATERALLY--- ASYMPTOMATIC  . History of echocardiogram 11-21-2006    EF 65%  . History of prostate cancer S/P RADIATION  TX  6 YRS AGO  . Psoriasis   . Arthritis  with psoriasis   . GERD (gastroesophageal reflux disease)     CONTROLLED W/ PROTONIX  . Borderline diabetes     DIET CONTROLLED - PT STATES HE IS NOT DIABETIC  . Lumbar spondylosis W/ RADICULOPATHY  . Iron deficiency   . Cataract immature LEFT EYE  . PAF (paroxysmal atrial fibrillation)   . SVT (supraventricular tachycardia)     S/P ABLATION   2008  . Coronary artery disease CARDIOLOGIST- DR Crissie Sickles    09-08-10 VISIT AND PACEMAKER CHECK  IN EPIC  . Cancer     HX OF PROSTATE CANCER--TX'D WITH RADIATION  . Sleep apnea TESTED YRS AGO-- NO CPAP RX GIVEN    PT STATES HE DOES NOT THINK HE STILL HAS SLEEP APNEA--DOES NOT USE CPAP  . Stroke Oct. 14, 2014   BP 143/70 mmHg  Pulse 72  Resp 14  SpO2 98%  Opioid Risk Score:   Fall Risk Score: Moderate Fall Risk (6-13 points)  Review of Systems  Genitourinary: Positive for difficulty urinating.  Neurological: Positive for dizziness and weakness.  All other systems reviewed and are negative.      Objective:   Physical Exam  Constitutional: He is oriented to person, place, and time. He appears well-developed and well-nourished.  HENT:  Head: Normocephalic and atraumatic.  Eyes: Conjunctivae and EOM are normal. Pupils are equal, round, and reactive to light.  Neurological: He is alert and oriented to person, place, and time.  Sensation intact bilateral upper extremity Motor strength is 4/5 in the left triceps otherwise 5/5 in the left upper extremity 5/5 bilateral lower extremities Latency of responses increased for questions.  Psychiatric: He has a normal mood and affect.  Nursing note and vitals reviewed.  Neuro:  Eyes without evidence of nystagmus   Cerebellar exam shows no evidence of ataxia on finger nose finger or heel to shin testing  Gait without evidence toe drag or knee instability.Ambulates without assisted device without difficulties.  Sensory exam is normal to  light touch in the upper and lower limbs   Cranial  nerves II- Visual fields are intact to confrontation testing, no blurring of vision III- no evidence of ptosis, upward, downward and medial gaze intact IV- no vertical diplopia or head tilt V- no facial numbness or masseter weakness VI- no pupil abduction weakness VII- no facial droop, good lid closure VII- normal auditory acuity IX- no pharygeal weakness, gag nl X- no pharyngeal weakness, no hoarseness XI- no trap or SCM weakness XII- no glossal weakness      Assessment & Plan:  1.  Right MCA infarct with cognitive deficits, short-term memory problems as well as minimal focal left upper cavity weakness. His exam has not changed since prior visit about 3 months ago. It appears that his mental status changes which occurred about 1 week ago or most likely a TIA rather than a seizure. He has been started on Keppra low dose 250 mg twice a day however complains of sedation during the day. I think it's safe to stop the morning dose of the Keppra and continue the evening dose until he follows up with neurology in 3 weeks.  2.  Post herpetic neuritis: Does not have actual pain but has persistent itching. He has been started on gabapentin for this and overall sleeping well at night however he would like to be off of as many medications as possible. I've instructed him to hold the gabapentin in the evening but if his symptoms increase he may resume.  Return to clinic 4 months  Over half of the 25 min visit was spent counseling and coordinating care.

## 2014-07-01 ENCOUNTER — Encounter: Payer: Self-pay | Admitting: Physical Medicine & Rehabilitation

## 2014-07-01 ENCOUNTER — Ambulatory Visit: Payer: Commercial Managed Care - HMO | Admitting: Physical Medicine & Rehabilitation

## 2014-07-04 ENCOUNTER — Ambulatory Visit: Payer: Medicare PPO | Admitting: Neurology

## 2014-07-05 ENCOUNTER — Other Ambulatory Visit: Payer: Self-pay

## 2014-07-05 MED ORDER — METHOTREXATE 2.5 MG PO TABS: 10.0000 mg | ORAL_TABLET | ORAL | Status: DC

## 2014-07-09 ENCOUNTER — Ambulatory Visit (INDEPENDENT_AMBULATORY_CARE_PROVIDER_SITE_OTHER): Payer: PPO | Admitting: Neurology

## 2014-07-09 ENCOUNTER — Encounter: Payer: Self-pay | Admitting: Neurology

## 2014-07-09 VITALS — BP 134/78 | HR 78 | Temp 97.6°F | Resp 18 | Ht 75.5 in | Wt 228.5 lb

## 2014-07-09 DIAGNOSIS — I6991 Cognitive deficits following unspecified cerebrovascular disease: Secondary | ICD-10-CM

## 2014-07-09 DIAGNOSIS — I480 Paroxysmal atrial fibrillation: Secondary | ICD-10-CM

## 2014-07-09 DIAGNOSIS — G459 Transient cerebral ischemic attack, unspecified: Secondary | ICD-10-CM

## 2014-07-09 DIAGNOSIS — I69919 Unspecified symptoms and signs involving cognitive functions following unspecified cerebrovascular disease: Secondary | ICD-10-CM

## 2014-07-09 NOTE — Progress Notes (Signed)
NEUROLOGY FOLLOW UP OFFICE NOTE  Christopher Baker [id number]  HISTORY OF PRESENT ILLNESS: Christopher Baker is a 81 year old right-handed man with hypertension, hyperlipidemia, paroxysmal atrial fibrillation, PPM implant for synocpe, and history of prostate cancer status post radiation in 2007, who follows up for vascular-related mild cognitive impairment, cardioembolic stroke and tremor.  UPDATE: He was admitted to [facility] on 06/18/14 for confusion.  Episode lasted about 1 hour.  He was at dinner with his wife and had difficulty ordering from the menu.  His wife noted a left facial droop.  He did not have any slurred speech, focal numbness or weakness of the extremities.  His blood pressure was found to be in the 190s/80s.  CT of head revealed no abnormality.  Repeated CT of the head the following day showed no acute stroke.  Echo showed mildly decreased EF of 45-50%.  Carotid doppler showed 40% bilateral ICA stenosis.  An EEG was performed, which was negative.  Differential diagnosis included TIA verse seizure and he was started on Keppra 250mg  twice daily.  He was also restarted on lisinopril, which had been discontinued earlier in the month.  Since discharge, he exhibited lethargy and irritability.  The Keppra was decreased to 250mg  once daily and he has exhibited improvement but still notes lethargy.  HISTORY: I.  Cardio-embolic stroke He was admitted to the hospital on 04/03/13 with left hand and arm weakness and facial droop.  CT head revealed right basal ganglia and parietal lobe infarcts.  He was found to have atrial fibrillation.  He was started on Xarelto.    II. Memory problems Over the last 2.5 years, he has had problems with memory.  He was repeating things and exhibited mental fogginess and lack of motivation.  He was experiencing depression and insomnia as well.  Procedural tasks and executive functioning were intact.  He was found to have a B12 level of 52 and was started on B12  shots.  He and his wife had noticed improvement in his memory.  However, insomnia and depression were still both an issue.  He had been under a lot of stress.  He was started on Celexa for depression and Proson for insomnia.  Last year, he exhibited extreme lethargy, to the point where he was sleeping during the day, which he never previously done.  Sometimes, he was in such a deep sleep, it required repeated yelling and shaking to wake him up.  He seemed more "foggy" again.  One time, he couldn't figure out where to put the ice cream in the refrigerator.  When his wife told him to put it in the freezer, he was placing it in the vegetable drawer.  He decided to stop the Proson, and he and his wife noticed significant improvement in lethargy.  He did note some improvement in memory since starting B12 supplementation.  Memory seemed worse after the stroke, so cognitive deficits were thought to be also related to stroke.  Since the stroke, he is more irritable, particularly because he doesn't feel as independent anymore.  MOCA from 12/27/13 was 26/30, which appeared stable.  He had formal neuropsych testing performed by Dr. Valentina Shaggy earlier this year.  He reportedly did well.  No follow up was recommended. He had a formal driving evaluation in July 2015.  He did well.  He was told to drive with someone else in the car for a couple of months so he can regain confidence.  The only restriction was that  he should not drive at night unless it is a well-lit road.  He has since started driving alone sometimes, but only locally and during daylight hours.    III Tremor Last year, he began noticing mild tremor in his hands, noticeable when he holds a utensil or writes.  He first noticed this about a month or so ago.  He has some balance issues due to bilateral knee replacement surgeries.  No family history of dementia or tremor (possibly his mother had tremor).  He takes ropinirole  PAST MEDICAL HISTORY: Past Medical  History  Diagnosis Date  . Hyperlipidemia   . HTN (hypertension)   . Status post placement of cardiac pacemaker DDD-  06-04-10-- LAST CHECK 09-08-10 IN EPIC    SECONDARY TO SYNCOPY AND BRADYCARDIA  . Restless leg syndrome   . Normal nuclear stress test 2008    LOW RISK--   DR  WALL  . Carotid stenosis, bilateral PER DR EARLY / DUPLEX  09-09-10      40 - 59%   BILATERALLY--- ASYMPTOMATIC  . History of echocardiogram 11-21-2006    EF 65%  . History of prostate cancer S/P RADIATION  TX  6 YRS AGO  . Psoriasis   . Arthritis with psoriasis   . GERD (gastroesophageal reflux disease)     CONTROLLED W/ PROTONIX  . Borderline diabetes     DIET CONTROLLED - PT STATES HE IS NOT DIABETIC  . Lumbar spondylosis W/ RADICULOPATHY  . Iron deficiency   . Cataract immature LEFT EYE  . PAF (paroxysmal atrial fibrillation)   . SVT (supraventricular tachycardia)     S/P ABLATION   2008  . Coronary artery disease CARDIOLOGIST- DR Crissie Sickles    09-08-10 VISIT AND PACEMAKER CHECK  IN EPIC  . Cancer     HX OF PROSTATE CANCER--TX'D WITH RADIATION  . Sleep apnea TESTED YRS AGO-- NO CPAP RX GIVEN    PT STATES HE DOES NOT THINK HE STILL HAS SLEEP APNEA--DOES NOT USE CPAP  . Stroke Oct. 14, 2014    MEDICATIONS: Current Outpatient Prescriptions on File Prior to Visit  Medication Sig Dispense Refill  . atorvastatin (LIPITOR) 20 MG tablet Take 20 mg by mouth daily.    . citalopram (CELEXA) 20 MG tablet Take 1.5 tablets (30 mg total) by mouth daily. 135 tablet 1  . clotrimazole-betamethasone (LOTRISONE) cream Apply 1 application topically 2 (two) times daily as needed (psoriasis).    . Cyanocobalamin (VITAMIN B-12 IJ) Inject 3 mLs as directed every 14 (fourteen) days. Every two weeks    . ezetimibe (ZETIA) 10 MG tablet Take 1 tablet (10 mg total) by mouth daily. 90 tablet 3  . gabapentin (NEURONTIN) 300 MG capsule Take 300 mg by mouth at bedtime.    Marland Kitchen ipratropium (ATROVENT) 0.06 % nasal spray Place 2  sprays into both nostrils 4 (four) times daily. (Patient taking differently: Place 2 sprays into both nostrils 4 (four) times daily as needed for rhinitis. ) 45 mL 3  . iron polysaccharides (NIFEREX) 150 MG capsule Take 150 mg by mouth 2 (two) times daily.    Marland Kitchen lisinopril (PRINIVIL,ZESTRIL) 10 MG tablet Take 1 tablet (10 mg total) by mouth daily. 30 tablet 0  . methotrexate (RHEUMATREX) 2.5 MG tablet Take 4 tablets (10 mg total) by mouth once a week. Caution:Chemotherapy. Protect from light. Every monday 4 tablet 1  . mirabegron ER (MYRBETRIQ) 50 MG TB24 tablet Take 1 tablet (50 mg total) by mouth daily. 90 tablet  3  . pantoprazole (PROTONIX) 40 MG tablet Take 1 tablet (40 mg total) by mouth daily. 90 tablet 3  . predniSONE (DELTASONE) 5 MG tablet Take 1.5 tablets (7.5 mg total) by mouth daily. 135 tablet 1  . Rivaroxaban (XARELTO) 20 MG TABS tablet Take 1 tablet (20 mg total) by mouth daily with supper. This is the new medication for stroke prevention 90 tablet 3  . rOPINIRole (REQUIP) 2 MG tablet Take 1 tablet (2 mg total) by mouth at bedtime. 90 tablet 3  . solifenacin (VESICARE) 10 MG tablet Take 1 tablet (10 mg total) by mouth daily. Use vesicare or ditropan--not both 90 tablet 3  . triamcinolone cream (KENALOG) 0.1 % Apply to affected area as directed (Patient taking differently: Apply 1 application topically daily as needed (rash). Apply to affected area as directed) 480 g 0   No current facility-administered medications on file prior to visit.    ALLERGIES: Allergies  Allergen Reactions  . Ciprofloxacin Nausea Only    REACTION: GI upset  . Hydrocodone-Acetaminophen     Hallucinations in higher doses  . Other Other (See Comments)    SCALLOPS--SUDDEN GI ATTACK    FAMILY HISTORY: Family History  Problem Relation Age of Onset  . Stroke Mother   . Early death Father     SOCIAL HISTORY: History   Social History  . Marital Status: Married    Spouse Name: N/A    Number of  Children: N/A  . Years of Education: N/A   Occupational History  . retired    Social History Main Topics  . Smoking status: Former Smoker -- 0.10 packs/day for 55 years    Types: Cigarettes    Quit date: 12/07/1998  . Smokeless tobacco: Never Used  . Alcohol Use: Yes     Comment: RARE  . Drug Use: No  . Sexual Activity:    Partners: Female   Other Topics Concern  . Not on file   Social History Narrative    REVIEW OF SYSTEMS: Constitutional: No fevers, chills, or sweats, no generalized fatigue, change in appetite Eyes: No visual changes, double vision, eye pain Ear, nose and throat: No hearing loss, ear pain, nasal congestion, sore throat Cardiovascular: No chest pain, palpitations Respiratory:  No shortness of breath at rest or with exertion, wheezes GastrointestinaI: No nausea, vomiting, diarrhea, abdominal pain, fecal incontinence Genitourinary:  No dysuria, urinary retention or frequency Musculoskeletal:  No neck pain, back pain Integumentary: No rash, pruritus, skin lesions Neurological: as above Psychiatric: No depression, insomnia, anxiety Endocrine: No palpitations, fatigue, diaphoresis, mood swings, change in appetite, change in weight, increased thirst Hematologic/Lymphatic:  No anemia, purpura, petechiae. Allergic/Immunologic: no itchy/runny eyes, nasal congestion, recent allergic reactions, rashes  PHYSICAL EXAM: Filed Vitals:   07/09/14 1521  BP: 134/78  Pulse: 78  Temp: 97.6 F (36.4 C)  Resp: 18   General: No acute distress Head:  Normocephalic/atraumatic Eyes:  Fundoscopic exam unremarkable without vessel changes, exudates, hemorrhages or papilledema. Neck: supple, no paraspinal tenderness, full range of motion Heart:  Regular rate and rhythm Lungs:  Clear to auscultation bilaterally Back: No paraspinal tenderness Neurological Exam: alert and oriented to person, place, and time. Attention span and concentration intact, delayed recall poor, remote  memory intact, fund of knowledge intact.  Speech fluent and not dysarthric, language intact.   Montreal Cognitive Assessment  07/09/2014  Visuospatial/ Executive (0/5) 4  Naming (0/3) 3  Attention: Read list of digits (0/2) 2  Attention: Read list of letters (  0/1) 1  Attention: Serial 7 subtraction starting at 100 (0/3) 3  Language: Repeat phrase (0/2) 2  Language : Fluency (0/1) 1  Abstraction (0/2) 2  Delayed Recall (0/5) 0  Orientation (0/6) 6  Total 24  Adjusted Score (based on education) 24   CN II-XII intact. Fundoscopic exam unremarkable without vessel changes, exudates, hemorrhages or papilledema.  Bulk and tone normal, muscle strength 5/5 throughout.  Reduced finger-thumb tapping speed on left.  Finger to nose testing intact with fine postural and kinetic tremor.  Gait normal stance and stride  IMPRESSION: I suspect that Mr. Vondrak had a TIA rather than a partial seizure. Vascular related cognitive impairment.  PLAN: 1.  We will discontinue Keppra. 2.  Continue anticoagulation 3.  Blood pressure control 4.  Limit independent driving only locally during daylight hours.  For long-distance driving, should be accompanied by somebody else beside him in the car. 5.  Follow up in 3 months.  45 minutes spent with patient, over 50% spent discussing diagnosis and coordinating care.  Metta Clines, DO  CC: Roxy Cedar

## 2014-07-09 NOTE — Patient Instructions (Signed)
I think you had a TIA rather than a seizure.  Therefore, we will discontinue the Keppra.   As for driving, continue driving locally only during daylight hours.  If you need to drive longer distances, you must be accompanied by somebody, such as your wife. I would have you follow up with me in 3 months.

## 2014-07-10 ENCOUNTER — Encounter: Payer: Self-pay | Admitting: Family

## 2014-07-10 ENCOUNTER — Ambulatory Visit (INDEPENDENT_AMBULATORY_CARE_PROVIDER_SITE_OTHER): Payer: PPO | Admitting: Family

## 2014-07-10 VITALS — BP 104/62 | HR 83 | Temp 97.5°F | Ht 75.5 in | Wt 229.0 lb

## 2014-07-10 DIAGNOSIS — I10 Essential (primary) hypertension: Secondary | ICD-10-CM

## 2014-07-10 DIAGNOSIS — S50821A Blister (nonthermal) of right forearm, initial encounter: Secondary | ICD-10-CM

## 2014-07-10 LAB — CBC WITH DIFFERENTIAL/PLATELET
BASOS PCT: 0.2 % (ref 0.0–3.0)
Basophils Absolute: 0 10*3/uL (ref 0.0–0.1)
EOS ABS: 0.1 10*3/uL (ref 0.0–0.7)
EOS PCT: 0.8 % (ref 0.0–5.0)
HCT: 37.6 % — ABNORMAL LOW (ref 39.0–52.0)
HEMOGLOBIN: 12.4 g/dL — AB (ref 13.0–17.0)
LYMPHS PCT: 10.2 % — AB (ref 12.0–46.0)
Lymphs Abs: 1 10*3/uL (ref 0.7–4.0)
MCHC: 33 g/dL (ref 30.0–36.0)
MCV: 84.3 fl (ref 78.0–100.0)
Monocytes Absolute: 0.9 10*3/uL (ref 0.1–1.0)
Monocytes Relative: 8.7 % (ref 3.0–12.0)
NEUTROS ABS: 8 10*3/uL — AB (ref 1.4–7.7)
Neutrophils Relative %: 80.1 % — ABNORMAL HIGH (ref 43.0–77.0)
Platelets: 235 10*3/uL (ref 150.0–400.0)
RBC: 4.46 Mil/uL (ref 4.22–5.81)
RDW: 17.5 % — ABNORMAL HIGH (ref 11.5–15.5)
WBC: 10 10*3/uL (ref 4.0–10.5)

## 2014-07-10 NOTE — Patient Instructions (Signed)

## 2014-07-10 NOTE — Progress Notes (Signed)
Pre visit review using our clinic review tool, if applicable. No additional management support is needed unless otherwise documented below in the visit note. 

## 2014-07-10 NOTE — Progress Notes (Signed)
Subjective:    Patient ID: Christopher Baker, male    DOB: 12-18-1933, 81 y.o.   MRN: [id number]  HPI 81 y.o. White male presents today with chief complaint of a "rash on my right arm". Pt states that he fell "months ago and tore off an inch of skin". He treated the torn area with "burn patches, guaze and vasoline dressings". He also reports being seen at the hospital for evaluation. Pt reports the wound was getting better and then at the beginning of the week the spot became red and "bubbled up". He reports it is tender to the touch and has gotten larger. Pt also reports putting a new skin lotion on the area. Pt complains of variations in blood pressure, states it got as high as 170/80 two days ago, but otherwise is 140's/ 60's. Pt reports the night before he had the high blood pressure she ate a large popcorn with salt at the movie theater. Denies fever, chills, SOB, and change in appetite.    Past Medical History  Diagnosis Date  . Hyperlipidemia   . HTN (hypertension)   . Status post placement of cardiac pacemaker DDD-  06-04-10-- LAST CHECK 09-08-10 IN EPIC    SECONDARY TO SYNCOPY AND BRADYCARDIA  . Restless leg syndrome   . Normal nuclear stress test 2008    LOW RISK--   DR  WALL  . Carotid stenosis, bilateral PER DR EARLY / DUPLEX  09-09-10      40 - 59%   BILATERALLY--- ASYMPTOMATIC  . History of echocardiogram 11-21-2006    EF 65%  . History of prostate cancer S/P RADIATION  TX  6 YRS AGO  . Psoriasis   . Arthritis with psoriasis   . GERD (gastroesophageal reflux disease)     CONTROLLED W/ PROTONIX  . Borderline diabetes     DIET CONTROLLED - PT STATES HE IS NOT DIABETIC  . Lumbar spondylosis W/ RADICULOPATHY  . Iron deficiency   . Cataract immature LEFT EYE  . PAF (paroxysmal atrial fibrillation)   . SVT (supraventricular tachycardia)     S/P ABLATION   2008  . Coronary artery disease CARDIOLOGIST- DR Crissie Sickles    09-08-10 VISIT AND PACEMAKER CHECK  IN EPIC  . Cancer     HX OF PROSTATE CANCER--TX'D WITH RADIATION  . Sleep apnea TESTED YRS AGO-- NO CPAP RX GIVEN    PT STATES HE DOES NOT THINK HE STILL HAS SLEEP APNEA--DOES NOT USE CPAP  . Stroke Oct. 14, 2014    History   Social History  . Marital Status: Married    Spouse Name: N/A    Number of Children: N/A  . Years of Education: N/A   Occupational History  . retired    Social History Main Topics  . Smoking status: Former Smoker -- 0.10 packs/day for 55 years    Types: Cigarettes    Quit date: 12/07/1998  . Smokeless tobacco: Never Used  . Alcohol Use: Yes     Comment: RARE  . Drug Use: No  . Sexual Activity:    Partners: Female   Other Topics Concern  . Not on file   Social History Narrative    Past Surgical History  Procedure Laterality Date  . Replacement total knee  1997    RIGHT  . Cardiac pacemaker placement  06-04-10    DDD/ AND REMOVAL LOOR RECORDER  . Loop recorder placement  01-30-10  . Lumbar laminectomy/ diskectomy/ fusion  05-19-10  L4 - 5  . Cholecystectomy  2009  . Cardiac electrophysiology study and ablation  2008    FOR SVT  . Prostate biopsy  2007  . Transurethral resection of prostate  2006  . Inguinal hernia repair  2005    LEFT/   DONE WITH PENILE PROSTESIS SURG.  . Removal and placement penile prosthesis implant   2005    AND LEFT CORPOROPLASTY /    DONE INGUINAL REPAIR  . Penile prosthesis implant  1995  . Knee arthroscopy  06/02/2011    Procedure: ARTHROSCOPY KNEE;  Surgeon: Gearlean Alf;  Location: [facility];  Service: Orthopedics;  Laterality: Left;  LEFT KNEE ARTHROSCOPY WITH DEBRIDEMENT  . Total knee arthroplasty  12/20/2011    Procedure: TOTAL KNEE ARTHROPLASTY;  Surgeon: Gearlean Alf, MD;  Location: [facility];  Service: Orthopedics;  Laterality: Left;  . Back surgery    . Joint replacement    . Spine surgery      Family History  Problem Relation Age of Onset  . Stroke Mother   . Early death Father     Allergies    Allergen Reactions  . Ciprofloxacin Nausea Only    REACTION: GI upset  . Hydrocodone-Acetaminophen     Hallucinations in higher doses  . Other Other (See Comments)    SCALLOPS--SUDDEN GI ATTACK    Current Outpatient Prescriptions on File Prior to Visit  Medication Sig Dispense Refill  . atorvastatin (LIPITOR) 20 MG tablet Take 20 mg by mouth daily.    . citalopram (CELEXA) 20 MG tablet Take 1.5 tablets (30 mg total) by mouth daily. 135 tablet 1  . clotrimazole-betamethasone (LOTRISONE) cream Apply 1 application topically 2 (two) times daily as needed (psoriasis).    . Cyanocobalamin (VITAMIN B-12 IJ) Inject 3 mLs as directed every 14 (fourteen) days. Every two weeks    . ezetimibe (ZETIA) 10 MG tablet Take 1 tablet (10 mg total) by mouth daily. 90 tablet 3  . gabapentin (NEURONTIN) 300 MG capsule Take 300 mg by mouth at bedtime.    Marland Kitchen ipratropium (ATROVENT) 0.06 % nasal spray Place 2 sprays into both nostrils 4 (four) times daily. (Patient taking differently: Place 2 sprays into both nostrils 4 (four) times daily as needed for rhinitis. ) 45 mL 3  . iron polysaccharides (NIFEREX) 150 MG capsule Take 150 mg by mouth 2 (two) times daily.    Marland Kitchen lisinopril (PRINIVIL,ZESTRIL) 10 MG tablet Take 1 tablet (10 mg total) by mouth daily. 30 tablet 0  . methotrexate (RHEUMATREX) 2.5 MG tablet Take 4 tablets (10 mg total) by mouth once a week. Caution:Chemotherapy. Protect from light. Every monday 4 tablet 1  . mirabegron ER (MYRBETRIQ) 50 MG TB24 tablet Take 1 tablet (50 mg total) by mouth daily. 90 tablet 3  . pantoprazole (PROTONIX) 40 MG tablet Take 1 tablet (40 mg total) by mouth daily. 90 tablet 3  . predniSONE (DELTASONE) 5 MG tablet Take 1.5 tablets (7.5 mg total) by mouth daily. 135 tablet 1  . Rivaroxaban (XARELTO) 20 MG TABS tablet Take 1 tablet (20 mg total) by mouth daily with supper. This is the new medication for stroke prevention 90 tablet 3  . rOPINIRole (REQUIP) 2 MG tablet Take 1  tablet (2 mg total) by mouth at bedtime. 90 tablet 3  . solifenacin (VESICARE) 10 MG tablet Take 1 tablet (10 mg total) by mouth daily. Use vesicare or ditropan--not both 90 tablet 3  . triamcinolone cream (KENALOG) 0.1 %  Apply to affected area as directed (Patient taking differently: Apply 1 application topically daily as needed (rash). Apply to affected area as directed) 480 g 0   No current facility-administered medications on file prior to visit.    BP 104/62 mmHg  Pulse 83  Temp(Src) 97.5 F (36.4 C) (Oral)  Ht 6' 3.5" (1.918 m)  Wt 229 lb (103.874 kg)  BMI 28.24 kg/m2chart Review of Systems  Constitutional: Negative.   HENT: Negative.   Respiratory: Negative.   Cardiovascular: Negative for chest pain and palpitations.       Reports fluctuation in blood pressure.   Musculoskeletal: Negative.   Skin: Positive for color change, rash and wound.       To right forearm   Neurological: Positive for dizziness.       When blood pressures are low and playing golf.   Psychiatric/Behavioral: Negative.        Objective:   Physical Exam  Constitutional: He is oriented to person, place, and time. He appears well-developed and well-nourished. He is active.  Cardiovascular: Normal rate, regular rhythm and normal pulses.   Pulmonary/Chest: Effort normal and breath sounds normal.  Abdominal: Soft. Normal appearance and bowel sounds are normal.  Neurological: He is alert and oriented to person, place, and time.  Skin: Lesion noted.  Red, raised, lesion to right forearm. Approximately 1 inch in diameter. Not warm to touch.   Psychiatric: He has a normal mood and affect. His speech is normal and behavior is normal.          Assessment & Plan:  Price was seen today for no specified reason.  Diagnoses and associated orders for this visit:  Essential hypertension  Blister of right forearm, initial encounter - CBC with Differential   - Be careful what lotions and moisturizers are  applied to skin, monitor for further reactions.  - Continue to monitor blood pressures, report abnormalities

## 2014-07-13 ENCOUNTER — Encounter: Payer: Self-pay | Admitting: Family

## 2014-07-13 ENCOUNTER — Encounter: Payer: Self-pay | Admitting: Neurology

## 2014-07-25 ENCOUNTER — Other Ambulatory Visit: Payer: Self-pay

## 2014-07-25 MED ORDER — "SYRINGE/NEEDLE (DISP) 25G X 5/8"" 1 ML MISC": Status: DC

## 2014-08-01 ENCOUNTER — Other Ambulatory Visit: Payer: Self-pay

## 2014-08-01 MED ORDER — CYANOCOBALAMIN 1000 MCG/ML IJ SOLN: INTRAMUSCULAR | Status: DC

## 2014-08-07 ENCOUNTER — Other Ambulatory Visit: Payer: Self-pay | Admitting: Internal Medicine

## 2014-08-07 ENCOUNTER — Other Ambulatory Visit: Payer: Self-pay | Admitting: Family

## 2014-08-07 NOTE — Telephone Encounter (Signed)
Last OV 06/25/14 with G.Lovena Le, MD - 1 yr F/U (06/2015) - no changes were made to this medication.  Refill sent in for:  Zetia 10 mg - 1 tablet QD - #30, 6 refills

## 2014-08-08 ENCOUNTER — Other Ambulatory Visit: Payer: Self-pay

## 2014-08-08 MED ORDER — PANTOPRAZOLE SODIUM 40 MG PO TBEC
40.0000 mg | DELAYED_RELEASE_TABLET | Freq: Every day | ORAL | Status: DC
Start: 2014-08-08 — End: 2015-10-22

## 2014-08-23 ENCOUNTER — Other Ambulatory Visit: Payer: Self-pay | Admitting: Family

## 2014-08-24 ENCOUNTER — Other Ambulatory Visit: Payer: Self-pay | Admitting: Family

## 2014-08-29 ENCOUNTER — Other Ambulatory Visit: Payer: Self-pay | Admitting: Family

## 2014-09-01 ENCOUNTER — Other Ambulatory Visit: Payer: Self-pay | Admitting: Family

## 2014-09-09 ENCOUNTER — Telehealth: Payer: Self-pay | Admitting: Internal Medicine

## 2014-09-09 NOTE — Telephone Encounter (Signed)
New Msg     Request for surgical clearance:  1. What type of surgery is being performed? colonoscopy  2. When is this surgery scheduled? 09/17/14   3. Are there any medications that need to be held prior to surgery and how long? Pt would need to hold Xarelto, instructions needed on the amount of days.   4. Name of physician performing surgery? Dr. Morene Crocker   5. What is your office phone and fax number?  Fax [phone]   Please Fax over surgical clearance.

## 2014-09-10 NOTE — Telephone Encounter (Signed)
Pt has a history of cardio-embolic stroke in 6825 and then was admitted in December 2015 for TIA.  Per Dr. Tanna Furry note in January, pt often missed doses of his Xarelto.  Given recent TIA, will defer to Dr. Lovena Le to determine if he is okay to hold his Xarelto x 24 hr or if they should do a screening colonoscopy while on Xarelto.

## 2014-09-10 NOTE — Telephone Encounter (Signed)
Discussed with Dr Lovena Le.  Okay to hold for 24 hours prior and resume at discretion of MD doing procedure

## 2014-09-11 ENCOUNTER — Other Ambulatory Visit: Payer: Self-pay | Admitting: Family

## 2014-09-12 ENCOUNTER — Encounter: Payer: Commercial Managed Care - HMO | Admitting: Internal Medicine

## 2014-09-16 ENCOUNTER — Telehealth: Payer: Self-pay | Admitting: Internal Medicine

## 2014-09-16 NOTE — Telephone Encounter (Signed)
New message     Pt want a  nurse to know Dr Earlean Shawl will fax labs to Dr Lovena Le today.  He is having a colonoscopy tomorrow. Wife said Dr Liliane Channel office will call us today but she wanted someone to watch out for the lab faxed.

## 2014-09-24 ENCOUNTER — Encounter: Payer: PPO | Admitting: *Deleted

## 2014-09-24 ENCOUNTER — Telehealth: Payer: Self-pay | Admitting: Cardiology

## 2014-09-24 NOTE — Telephone Encounter (Signed)
LMOVM reminding pt to send remote transmission.   

## 2014-09-25 ENCOUNTER — Encounter: Payer: Self-pay | Admitting: Cardiology

## 2014-10-02 ENCOUNTER — Telehealth: Payer: Self-pay

## 2014-10-02 ENCOUNTER — Other Ambulatory Visit: Payer: Self-pay | Admitting: Family

## 2014-10-02 MED ORDER — METHYLPREDNISOLONE 4 MG PO KIT: PACK | ORAL | Status: AC

## 2014-10-02 NOTE — Telephone Encounter (Signed)
Christopher Baker sent this message via MyChart through her chart. I called her and left a message on personally identified voicemail to advise that she should first complete the adult proxy form in Christopher Baker chart to give [place] permission to communicate with her via [place]. Also to advise her that when sending a message about him, she will need to send it through his chart, as the messages are permanently attached to the chart.  My husband, Christopher Baker, is showing signs of gout on outside of large toe on left foot -- warm, red circle, pain, tenderness. He is singing in the [place] annual show THIS Saturday and must wear tux and dress shoes. Is there anything he can do (or you can do) to stop this attack early and fast?? He had his first attack of gout in same spot while in the hospital after his stroke October 2014. I do not recall another attack since.  Please advise

## 2014-10-02 NOTE — Telephone Encounter (Signed)
Pt aware.

## 2014-10-02 NOTE — Telephone Encounter (Signed)
I have sent in medrol for relief.

## 2014-10-08 ENCOUNTER — Ambulatory Visit: Payer: Commercial Managed Care - HMO | Admitting: Family

## 2014-10-08 ENCOUNTER — Encounter: Payer: Self-pay | Admitting: Internal Medicine

## 2014-10-08 ENCOUNTER — Ambulatory Visit (INDEPENDENT_AMBULATORY_CARE_PROVIDER_SITE_OTHER): Payer: PPO | Admitting: Internal Medicine

## 2014-10-08 VITALS — BP 128/56 | HR 77 | Ht 75.0 in | Wt 230.4 lb

## 2014-10-08 DIAGNOSIS — R55 Syncope and collapse: Secondary | ICD-10-CM

## 2014-10-08 DIAGNOSIS — R609 Edema, unspecified: Secondary | ICD-10-CM

## 2014-10-08 DIAGNOSIS — R011 Cardiac murmur, unspecified: Secondary | ICD-10-CM | POA: Diagnosis not present

## 2014-10-08 DIAGNOSIS — I6340 Cerebral infarction due to embolism of unspecified cerebral artery: Secondary | ICD-10-CM

## 2014-10-08 DIAGNOSIS — I480 Paroxysmal atrial fibrillation: Secondary | ICD-10-CM

## 2014-10-08 DIAGNOSIS — Z950 Presence of cardiac pacemaker: Secondary | ICD-10-CM

## 2014-10-08 DIAGNOSIS — I10 Essential (primary) hypertension: Secondary | ICD-10-CM

## 2014-10-08 LAB — MDC_IDC_ENUM_SESS_TYPE_INCLINIC
Battery Impedance: 228 Ohm
Battery Remaining Longevity: 123 mo
Battery Voltage: 2.79 V
Brady Statistic AP VS Percent: 28 %
Date Time Interrogation Session: 20160419125453
Lead Channel Impedance Value: 409 Ohm
Lead Channel Pacing Threshold Amplitude: 0.75 V
Lead Channel Pacing Threshold Amplitude: 1.25 V
Lead Channel Pacing Threshold Pulse Width: 0.4 ms
Lead Channel Pacing Threshold Pulse Width: 0.4 ms
Lead Channel Setting Pacing Amplitude: 2.5 V
Lead Channel Setting Pacing Pulse Width: 0.4 ms
Lead Channel Setting Sensing Sensitivity: 2.8 mV
MDC IDC MSMT LEADCHNL RA SENSING INTR AMPL: 2.8 mV
MDC IDC MSMT LEADCHNL RV IMPEDANCE VALUE: 353 Ohm
MDC IDC MSMT LEADCHNL RV SENSING INTR AMPL: 5.6 mV
MDC IDC SET LEADCHNL RA PACING AMPLITUDE: 2 V
MDC IDC STAT BRADY AP VP PERCENT: 4 %
MDC IDC STAT BRADY AS VP PERCENT: 1 %
MDC IDC STAT BRADY AS VS PERCENT: 67 %

## 2014-10-08 LAB — BASIC METABOLIC PANEL
BUN: 22 mg/dL (ref 6–23)
CO2: 27 mEq/L (ref 19–32)
Calcium: 9.3 mg/dL (ref 8.4–10.5)
Chloride: 109 mEq/L (ref 96–112)
Creatinine, Ser: 1.05 mg/dL (ref 0.40–1.50)
GFR: 72.38 mL/min (ref >60.00)
Glucose, Bld: 105 mg/dL — ABNORMAL HIGH (ref 70–99)
POTASSIUM: 4.2 meq/L (ref 3.5–5.1)
SODIUM: 140 meq/L (ref 135–145)

## 2014-10-08 LAB — HEPATIC FUNCTION PANEL
ALBUMIN: 3.7 g/dL (ref 3.5–5.2)
ALT: 20 U/L (ref 0–53)
AST: 19 U/L (ref 0–37)
Alkaline Phosphatase: 70 U/L (ref 39–117)
BILIRUBIN TOTAL: 0.5 mg/dL (ref 0.2–1.2)
Bilirubin, Direct: 0.1 mg/dL (ref 0.0–0.3)
TOTAL PROTEIN: 6 g/dL (ref 6.0–8.3)

## 2014-10-08 LAB — TSH: TSH: 1.8 u[IU]/mL (ref 0.35–4.50)

## 2014-10-08 MED ORDER — FUROSEMIDE 20 MG PO TABS: 40.0000 mg | ORAL_TABLET | Freq: Every day | ORAL | Status: DC

## 2014-10-08 NOTE — Assessment & Plan Note (Signed)
He will continue systemic anticoagulation with Xarelto. Will follow.

## 2014-10-08 NOTE — Patient Instructions (Addendum)
Medication Instructions:  Your physician has recommended you make the following change in your medication:  1) START Lasix 40 mg daily - do NOT start until after complete 24 hour urine test.     Labwork: Your physician recommends that you return for lab work: TODAY (BMP,TSH, [place]  Your physician recommends that you return for lab work in: 24-hour urine to evaluate for protein.    Testing/Procedures: Your physician has requested that you have an echocardiogram. Echocardiography is a painless test that uses sound waves to create images of your heart. It provides your doctor with information about the size and shape of your heart and how well your heart's chambers and valves are working. This procedure takes approximately one hour. There are no restrictions for this procedure.   Follow-Up: Your physician wants you to follow-up in: 12 months with Dr. Lovena Le. You will receive a reminder letter in the mail two months in advance. If you don't receive a letter, please call our office to schedule the follow-up appointment.  Remote monitoring is used to monitor your Pacemaker or ICD from home. This monitoring reduces the number of office visits required to check your device to one time per year. It allows [place] to keep an eye on the functioning of your device to ensure it is working properly. You are scheduled for a device check from home on 01/07/2015. You may send your transmission at any time that day. If you have a wireless device, the transmission will be sent automatically. After your physician reviews your transmission, you will receive a postcard with your next transmission date.     Any Other Special Instructions Will Be Listed Below (If Applicable).

## 2014-10-08 NOTE — Progress Notes (Signed)
HPI [name] returns today for followup. He is a very pleasant 81 year old man with a history of unexplained syncope, symptomatic bradycardia, status post permanent pacemaker insertion, and hypertension. He has had a stroke as he previously had refused anti-coagulation despite his documented PAF.   No syncope. He had a TIA several weeks ago. He admits to occaisionally missing his medications including Xarelto. He has also had uncontrolled HTN. He has had some chronic lower extremity edema which has worsened significantly. No medical non- compliance. He admits to some dietary non-compliance.  Allergies  Allergen Reactions  . Ciprofloxacin Nausea Only    REACTION: GI upset  . Hydrocodone-Acetaminophen     Hallucinations in higher doses  . Other Other (See Comments)    SCALLOPS--SUDDEN GI ATTACK     Current Outpatient Prescriptions  Medication Sig Dispense Refill  . atorvastatin (LIPITOR) 20 MG tablet Take 20 mg by mouth daily.    . citalopram (CELEXA) 20 MG tablet Take 1.5 tablets (30 mg total) by mouth daily. 135 tablet 1  . clotrimazole-betamethasone (LOTRISONE) cream Apply 1 application topically 2 (two) times daily as needed (psoriasis).    . cyanocobalamin (,VITAMIN B-12,) 1000 MCG/ML injection Inject 6mL twice a month (Patient taking differently: Inject 76mL into the skin twice a month) 10 mL 2  . ezetimibe (ZETIA) 10 MG tablet Take 1 tablet (10 mg total) by mouth daily. 90 tablet 3  . ipratropium (ATROVENT) 0.06 % nasal spray Place 2 sprays into both nostrils 4 (four) times daily. (Patient taking differently: Place 2 sprays into both nostrils 4 (four) times daily as needed for rhinitis. ) 45 mL 3  . iron polysaccharides (NIFEREX) 150 MG capsule Take 150 mg by mouth 2 (two) times daily.    Marland Kitchen lisinopril (PRINIVIL,ZESTRIL) 10 MG tablet Take 1 tablet (10 mg total) by mouth daily. 30 tablet 0  . methotrexate (RHEUMATREX) 2.5 MG tablet Take 4 tablets (10 mg total) by mouth once a week.  Caution:Chemotherapy. Protect from light. Every monday 4 tablet 1  . methylPREDNISolone (MEDROL DOSEPAK) 4 MG tablet follow package directions 21 tablet 0  . mirabegron ER (MYRBETRIQ) 50 MG TB24 tablet Take 1 tablet (50 mg total) by mouth daily. 90 tablet 3  . pantoprazole (PROTONIX) 40 MG tablet Take 1 tablet (40 mg total) by mouth daily. 90 tablet 3  . predniSONE (DELTASONE) 5 MG tablet Take 1.5 tablets (7.5 mg total) by mouth daily. 135 tablet 1  . rOPINIRole (REQUIP) 2 MG tablet TAKE 1 TABLET (2 MG TOTAL) BY MOUTH 2 (TWO) TIMES DAILY. 60 tablet 3  . solifenacin (VESICARE) 10 MG tablet Take 1 tablet (10 mg total) by mouth daily. Use vesicare or ditropan--not both 90 tablet 3  . SYRINGE/NEEDLE, DISP, 1 ML 25G X 5/8" 1 ML MISC Use to inject 35ml of B-12 every two weeks 100 each 1  . triamcinolone cream (KENALOG) 0.1 % Apply to affected area as directed (Patient taking differently: Apply 1 application topically daily as needed (rash). Apply to affected area as directed) 480 g 0  . XARELTO 20 MG TABS tablet TAKE 1 TABLET (20 MG TOTAL) BY MOUTH DAILY WITH SUPPER. THIS IS THE NEW MEDICATION FOR STROKE PREVENTION 30 tablet 3  . furosemide (LASIX) 20 MG tablet Take 2 tablets (40 mg total) by mouth daily. 90 tablet 3   No current facility-administered medications for this visit.     Past Medical History  Diagnosis Date  . Hyperlipidemia   . HTN (hypertension)   .  Status post placement of cardiac pacemaker DDD-  06-04-10-- LAST CHECK 09-08-10 IN EPIC    SECONDARY TO SYNCOPY AND BRADYCARDIA  . Restless leg syndrome   . Normal nuclear stress test 2008    LOW RISK--   DR  WALL  . Carotid stenosis, bilateral PER DR EARLY / DUPLEX  09-09-10      40 - 59%   BILATERALLY--- ASYMPTOMATIC  . History of echocardiogram 11-21-2006    EF 65%  . History of prostate cancer S/P RADIATION  TX  6 YRS AGO  . Psoriasis   . Arthritis with psoriasis   . GERD (gastroesophageal reflux disease)     CONTROLLED W/  PROTONIX  . Borderline diabetes     DIET CONTROLLED - PT STATES HE IS NOT DIABETIC  . Lumbar spondylosis W/ RADICULOPATHY  . Iron deficiency   . Cataract immature LEFT EYE  . PAF (paroxysmal atrial fibrillation)   . SVT (supraventricular tachycardia)     S/P ABLATION   2008  . Coronary artery disease CARDIOLOGIST- DR Crissie Sickles    09-08-10 VISIT AND PACEMAKER CHECK  IN EPIC  . Cancer     HX OF PROSTATE CANCER--TX'D WITH RADIATION  . Sleep apnea TESTED YRS AGO-- NO CPAP RX GIVEN    PT STATES HE DOES NOT THINK HE STILL HAS SLEEP APNEA--DOES NOT USE CPAP  . Stroke Oct. 14, 2014    ROS:   All systems reviewed and negative except as noted in the HPI.   Past Surgical History  Procedure Laterality Date  . Replacement total knee  1997    RIGHT  . Cardiac pacemaker placement  06-04-10    DDD/ AND REMOVAL LOOR RECORDER  . Loop recorder placement  01-30-10  . Lumbar laminectomy/ diskectomy/ fusion  05-19-10    L4 - 5  . Cholecystectomy  2009  . Cardiac electrophysiology study and ablation  2008    FOR SVT  . Prostate biopsy  2007  . Transurethral resection of prostate  2006  . Inguinal hernia repair  2005    LEFT/   DONE WITH PENILE PROSTESIS SURG.  . Removal and placement penile prosthesis implant   2005    AND LEFT CORPOROPLASTY /    DONE INGUINAL REPAIR  . Penile prosthesis implant  1995  . Knee arthroscopy  06/02/2011    Procedure: ARTHROSCOPY KNEE;  Surgeon: Gearlean Alf;  Location: [facility];  Service: Orthopedics;  Laterality: Left;  LEFT KNEE ARTHROSCOPY WITH DEBRIDEMENT  . Total knee arthroplasty  12/20/2011    Procedure: TOTAL KNEE ARTHROPLASTY;  Surgeon: Gearlean Alf, MD;  Location: [facility];  Service: Orthopedics;  Laterality: Left;  . Back surgery    . Joint replacement    . Spine surgery       Family History  Problem Relation Age of Onset  . Stroke Mother   . Early death Father      History   Social History  . Marital Status:  Married    Spouse Name: N/A  . Number of Children: N/A  . Years of Education: N/A   Occupational History  . retired    Social History Main Topics  . Smoking status: Former Smoker -- 0.10 packs/day for 55 years    Types: Cigarettes    Quit date: 12/07/1998  . Smokeless tobacco: Never Used  . Alcohol Use: Yes     Comment: RARE  . Drug Use: No  . Sexual Activity:  Partners: Female   Other Topics Concern  . Not on file   Social History Narrative     BP 128/56 mmHg  Pulse 77  Ht 6\' 3"  (1.905 m)  Wt 230 lb 6.4 oz (104.509 kg)  BMI 28.80 kg/m2  Physical Exam:  stable appearing 81 yo man, NAD HEENT: Unremarkable Neck:  7 cm JVD, no thyromegally Back:  No CVA tenderness Lungs:  Clear with no wheezes, well healed PPM incision. HEART:  Regular rate rhythm, no murmurs, no rubs, no clicks Abd:  soft, positive bowel sounds, no organomegally, no rebound, no guarding Ext:  2 plus pulses, 3+ peripheral edema, no cyanosis, no clubbing Skin:  No rashes no nodules Neuro:  CN II through XII intact, motor grossly intact  DEVICE  Normal device function.  See PaceArt for details.   Assess/Plan:

## 2014-10-08 NOTE — Assessment & Plan Note (Signed)
His blood pressure is well controlled. Will follow. He is instructe to maintain a low sodium diet and continue his current meds.

## 2014-10-08 NOTE — Assessment & Plan Note (Signed)
His medtronic DDD PM is working normally. Will recheck in several months. 

## 2014-10-08 NOTE — Assessment & Plan Note (Signed)
In the interim, he has maintained NSR very nicely. Will follow.

## 2014-10-08 NOTE — Assessment & Plan Note (Signed)
His peripheral edema has worsened. I am concerned about nephrotic syndrome and have recommedned a 24 hour urine. Based on the results will plan diuretic therapy. He is encouraged to maintain a low sodium diet.

## 2014-10-10 ENCOUNTER — Ambulatory Visit (HOSPITAL_COMMUNITY): Payer: PPO | Attending: Cardiology | Admitting: Radiology

## 2014-10-10 DIAGNOSIS — E785 Hyperlipidemia, unspecified: Secondary | ICD-10-CM | POA: Diagnosis not present

## 2014-10-10 DIAGNOSIS — R011 Cardiac murmur, unspecified: Secondary | ICD-10-CM | POA: Insufficient documentation

## 2014-10-10 DIAGNOSIS — R55 Syncope and collapse: Secondary | ICD-10-CM

## 2014-10-10 DIAGNOSIS — Z87891 Personal history of nicotine dependence: Secondary | ICD-10-CM | POA: Diagnosis not present

## 2014-10-10 DIAGNOSIS — I480 Paroxysmal atrial fibrillation: Secondary | ICD-10-CM

## 2014-10-10 NOTE — Progress Notes (Signed)
Echocardiogram performed.  

## 2014-10-11 ENCOUNTER — Ambulatory Visit (INDEPENDENT_AMBULATORY_CARE_PROVIDER_SITE_OTHER): Payer: PPO | Admitting: Family

## 2014-10-11 ENCOUNTER — Encounter: Payer: PPO | Admitting: Internal Medicine

## 2014-10-11 ENCOUNTER — Encounter: Payer: Self-pay | Admitting: Family

## 2014-10-11 VITALS — BP 134/70 | HR 78 | Temp 98.3°F | Resp 20 | Ht 75.0 in | Wt 226.0 lb

## 2014-10-11 DIAGNOSIS — F32A Depression, unspecified: Secondary | ICD-10-CM

## 2014-10-11 DIAGNOSIS — F329 Major depressive disorder, single episode, unspecified: Secondary | ICD-10-CM | POA: Diagnosis not present

## 2014-10-11 DIAGNOSIS — R609 Edema, unspecified: Secondary | ICD-10-CM | POA: Diagnosis not present

## 2014-10-11 DIAGNOSIS — E7800 Pure hypercholesterolemia, unspecified: Secondary | ICD-10-CM

## 2014-10-11 DIAGNOSIS — K5900 Constipation, unspecified: Secondary | ICD-10-CM

## 2014-10-11 DIAGNOSIS — I10 Essential (primary) hypertension: Secondary | ICD-10-CM | POA: Diagnosis not present

## 2014-10-11 LAB — PROTEIN, URINE, 24 HOUR
Protein, 24H Urine: 90 mg/d (ref <150)
Protein, Urine: 15 mg/dL (ref 5–25)

## 2014-10-11 NOTE — Patient Instructions (Signed)

## 2014-10-11 NOTE — Progress Notes (Signed)
Pre visit review using our clinic review tool, if applicable. No additional management support is needed unless otherwise documented below in the visit note. 

## 2014-10-11 NOTE — Progress Notes (Signed)
Subjective:    Patient ID: Christopher Baker, male    DOB: 12-May-1934, 81 y.o.   MRN: [id number]  HPI  81 year old white male, nonsmoker with a history of hypertension, atrial fibrillation , peripheral edema , depression , hypercholesterolemia his in today for recheck. He's been under the care of cardiology for management of peripheral edema. He's had 2-3+ pitting edema. His on Lasix 20 mg twice a day and his decreased his weight by 4 pounds since Tuesday when he started the medication. He is scheduled to have an echocardiogram. Also has complaints of constipation since his colonoscopy about a month ago. Wife started Colace approximately one week ago when he had a normal bowel movement this morning. Reports being able to pass gas well. Denies any blood in stool or dark black stool.   Review of Systems  Constitutional: Negative.   HENT: Negative.   Cardiovascular: Positive for leg swelling. Negative for chest pain and palpitations.  Gastrointestinal: Negative.   Endocrine: Negative.   Genitourinary: Negative.   Musculoskeletal: Negative.   Allergic/Immunologic: Negative.   Neurological: Negative.   Psychiatric/Behavioral: Negative.    Past Medical History  Diagnosis Date  . Hyperlipidemia   . HTN (hypertension)   . Status post placement of cardiac pacemaker DDD-  06-04-10-- LAST CHECK 09-08-10 IN EPIC    SECONDARY TO SYNCOPY AND BRADYCARDIA  . Restless leg syndrome   . Normal nuclear stress test 2008    LOW RISK--   DR  WALL  . Carotid stenosis, bilateral PER DR EARLY / DUPLEX  09-09-10      40 - 59%   BILATERALLY--- ASYMPTOMATIC  . History of echocardiogram 11-21-2006    EF 65%  . History of prostate cancer S/P RADIATION  TX  6 YRS AGO  . Psoriasis   . Arthritis with psoriasis   . GERD (gastroesophageal reflux disease)     CONTROLLED W/ PROTONIX  . Borderline diabetes     DIET CONTROLLED - PT STATES HE IS NOT DIABETIC  . Lumbar spondylosis W/ RADICULOPATHY  . Iron deficiency     . Cataract immature LEFT EYE  . PAF (paroxysmal atrial fibrillation)   . SVT (supraventricular tachycardia)     S/P ABLATION   2008  . Coronary artery disease CARDIOLOGIST- DR Crissie Sickles    09-08-10 VISIT AND PACEMAKER CHECK  IN EPIC  . Cancer     HX OF PROSTATE CANCER--TX'D WITH RADIATION  . Sleep apnea TESTED YRS AGO-- NO CPAP RX GIVEN    PT STATES HE DOES NOT THINK HE STILL HAS SLEEP APNEA--DOES NOT USE CPAP  . Stroke Oct. 14, 2014    History   Social History  . Marital Status: Married    Spouse Name: N/A  . Number of Children: N/A  . Years of Education: N/A   Occupational History  . retired    Social History Main Topics  . Smoking status: Former Smoker -- 0.10 packs/day for 55 years    Types: Cigarettes    Quit date: 12/07/1998  . Smokeless tobacco: Never Used  . Alcohol Use: Yes     Comment: RARE  . Drug Use: No  . Sexual Activity:    Partners: Female   Other Topics Concern  . Not on file   Social History Narrative    Past Surgical History  Procedure Laterality Date  . Replacement total knee  1997    RIGHT  . Cardiac pacemaker placement  06-04-10    DDD/ AND REMOVAL  LOOR RECORDER  . Loop recorder placement  01-30-10  . Lumbar laminectomy/ diskectomy/ fusion  05-19-10    L4 - 5  . Cholecystectomy  2009  . Cardiac electrophysiology study and ablation  2008    FOR SVT  . Prostate biopsy  2007  . Transurethral resection of prostate  2006  . Inguinal hernia repair  2005    LEFT/   DONE WITH PENILE PROSTESIS SURG.  . Removal and placement penile prosthesis implant   2005    AND LEFT CORPOROPLASTY /    DONE INGUINAL REPAIR  . Penile prosthesis implant  1995  . Knee arthroscopy  06/02/2011    Procedure: ARTHROSCOPY KNEE;  Surgeon: Gearlean Alf;  Location: [facility];  Service: Orthopedics;  Laterality: Left;  LEFT KNEE ARTHROSCOPY WITH DEBRIDEMENT  . Total knee arthroplasty  12/20/2011    Procedure: TOTAL KNEE ARTHROPLASTY;  Surgeon:  Gearlean Alf, MD;  Location: [facility];  Service: Orthopedics;  Laterality: Left;  . Back surgery    . Joint replacement    . Spine surgery      Family History  Problem Relation Age of Onset  . Stroke Mother   . Early death Father     Allergies  Allergen Reactions  . Ciprofloxacin Nausea Only    REACTION: GI upset  . Hydrocodone-Acetaminophen     Hallucinations in higher doses  . Other Other (See Comments)    SCALLOPS--SUDDEN GI ATTACK    Current Outpatient Prescriptions on File Prior to Visit  Medication Sig Dispense Refill  . atorvastatin (LIPITOR) 20 MG tablet Take 20 mg by mouth daily.    . citalopram (CELEXA) 20 MG tablet Take 1.5 tablets (30 mg total) by mouth daily. 135 tablet 1  . clotrimazole-betamethasone (LOTRISONE) cream Apply 1 application topically 2 (two) times daily as needed (psoriasis).    . cyanocobalamin (,VITAMIN B-12,) 1000 MCG/ML injection Inject 63mL twice a month (Patient taking differently: Inject 8mL into the skin twice a month) 10 mL 2  . ezetimibe (ZETIA) 10 MG tablet Take 1 tablet (10 mg total) by mouth daily. 90 tablet 3  . furosemide (LASIX) 20 MG tablet Take 2 tablets (40 mg total) by mouth daily. 90 tablet 3  . ipratropium (ATROVENT) 0.06 % nasal spray Place 2 sprays into both nostrils 4 (four) times daily. (Patient taking differently: Place 2 sprays into both nostrils 4 (four) times daily as needed for rhinitis. ) 45 mL 3  . iron polysaccharides (NIFEREX) 150 MG capsule Take 150 mg by mouth 2 (two) times daily.    Marland Kitchen lisinopril (PRINIVIL,ZESTRIL) 10 MG tablet Take 1 tablet (10 mg total) by mouth daily. 30 tablet 0  . methotrexate (RHEUMATREX) 2.5 MG tablet Take 4 tablets (10 mg total) by mouth once a week. Caution:Chemotherapy. Protect from light. Every monday 4 tablet 1  . mirabegron ER (MYRBETRIQ) 50 MG TB24 tablet Take 1 tablet (50 mg total) by mouth daily. 90 tablet 3  . pantoprazole (PROTONIX) 40 MG tablet Take 1 tablet (40 mg total) by mouth  daily. 90 tablet 3  . predniSONE (DELTASONE) 5 MG tablet Take 1.5 tablets (7.5 mg total) by mouth daily. 135 tablet 1  . rOPINIRole (REQUIP) 2 MG tablet TAKE 1 TABLET (2 MG TOTAL) BY MOUTH 2 (TWO) TIMES DAILY. 60 tablet 3  . solifenacin (VESICARE) 10 MG tablet Take 1 tablet (10 mg total) by mouth daily. Use vesicare or ditropan--not both 90 tablet 3  . SYRINGE/NEEDLE, DISP, 1  ML 25G X 5/8" 1 ML MISC Use to inject 41ml of B-12 every two weeks 100 each 1  . triamcinolone cream (KENALOG) 0.1 % Apply to affected area as directed (Patient taking differently: Apply 1 application topically daily as needed (rash). Apply to affected area as directed) 480 g 0  . XARELTO 20 MG TABS tablet TAKE 1 TABLET (20 MG TOTAL) BY MOUTH DAILY WITH SUPPER. THIS IS THE NEW MEDICATION FOR STROKE PREVENTION 30 tablet 3   No current facility-administered medications on file prior to visit.    BP 134/70 mmHg  Pulse 78  Temp(Src) 98.3 F (36.8 C) (Oral)  Resp 20  Ht 6\' 3"  (1.905 m)  Wt 226 lb (102.513 kg)  BMI 28.25 kg/m2  SpO2 98%chart    Objective:   Physical Exam  Constitutional: He is oriented to person, place, and time. He appears well-developed and well-nourished.  HENT:  Right Ear: External ear normal.  Left Ear: External ear normal.  Nose: Nose normal.  Mouth/Throat: Oropharynx is clear and moist.  Neck: Normal range of motion. Neck supple.  Cardiovascular: Normal rate and normal heart sounds.   Pulmonary/Chest: Effort normal and breath sounds normal.  Abdominal: Soft. Bowel sounds are normal.  Musculoskeletal: He exhibits edema. He exhibits no tenderness.  Neurological: He is alert and oriented to person, place, and time.  Skin: Skin is warm and dry.  Psychiatric: He has a normal mood and affect.          Assessment & Plan:  Dvid was seen today for follow-up.  Diagnoses and all orders for this visit:  Pure hypercholesterolemia  Depression  Essential hypertension  Peripheral  edema  Constipation, unspecified constipation type   continue current medication. Follow-up cardiology as scheduled. Elevate legs.  Low-sodium diet. Follow-up in 6 months. Follow with specialist as scheduled and sooner as needed.

## 2014-10-14 ENCOUNTER — Encounter: Payer: Self-pay | Admitting: Internal Medicine

## 2014-10-14 DIAGNOSIS — R609 Edema, unspecified: Secondary | ICD-10-CM

## 2014-10-15 ENCOUNTER — Ambulatory Visit (INDEPENDENT_AMBULATORY_CARE_PROVIDER_SITE_OTHER): Payer: PPO | Admitting: Neurology

## 2014-10-15 ENCOUNTER — Encounter: Payer: Self-pay | Admitting: Neurology

## 2014-10-15 VITALS — BP 108/68 | HR 68 | Resp 18 | Ht 75.0 in | Wt 221.6 lb

## 2014-10-15 DIAGNOSIS — R251 Tremor, unspecified: Secondary | ICD-10-CM | POA: Diagnosis not present

## 2014-10-15 DIAGNOSIS — I6991 Cognitive deficits following unspecified cerebrovascular disease: Secondary | ICD-10-CM | POA: Diagnosis not present

## 2014-10-15 DIAGNOSIS — I6340 Cerebral infarction due to embolism of unspecified cerebral artery: Secondary | ICD-10-CM | POA: Diagnosis not present

## 2014-10-15 DIAGNOSIS — I69919 Unspecified symptoms and signs involving cognitive functions following unspecified cerebrovascular disease: Secondary | ICD-10-CM

## 2014-10-15 MED ORDER — FUROSEMIDE 20 MG PO TABS: 40.0000 mg | ORAL_TABLET | Freq: Two times a day (BID) | ORAL | Status: DC

## 2014-10-15 NOTE — Progress Notes (Signed)
NEUROLOGY FOLLOW UP OFFICE NOTE  Christopher Baker [id number]  HISTORY OF PRESENT ILLNESS: Christopher Baker is a 81 year old right-handed man with hypertension, hyperlipidemia, paroxysmal atrial fibrillation, PPM implant for synocpe, and history of prostate cancer status post radiation in 2007, who follows up for vascular-related mild cognitive impairment, cardioembolic stroke and tremor.  UPDATE: No new changes.  He is still driving during the day.  If he or his wife feel he is not too alert, she will take over the driving.  He notes increased unsteadiness on his feet, especially when he is playing golf.  HISTORY: I.  Cardio-embolic stroke He was admitted to the hospital on 04/03/13 with left hand and arm weakness and facial droop.  CT head revealed right basal ganglia and parietal lobe infarcts.  He was found to have atrial fibrillation.  He was started on Xarelto.  He was admitted to [facility] on 06/18/14 for confusion.  Episode lasted about 1 hour.  He was at dinner with his wife and had difficulty ordering from the menu.  His wife noted a left facial droop.  He did not have any slurred speech, focal numbness or weakness of the extremities.  His blood pressure was found to be in the 190s/80s.  CT of head revealed no abnormality.  Repeated CT of the head the following day showed no acute stroke.  Echo showed mildly decreased EF of 45-50%.  Carotid doppler showed 40% bilateral ICA stenosis.  An EEG was performed, which was negative.  Differential diagnosis included TIA verse seizure and he was started on Keppra 250mg  twice daily.  He was also restarted on lisinopril, which had been discontinued earlier in the month.  Since discharge, he exhibited lethargy and irritability.  Keppra was subsequently discontinued as a TIA was suspected rather than partial seizure.  II. Memory problems Over the last 2.5 years, he has had problems with memory.  He was repeating things and exhibited mental fogginess and  lack of motivation.  He was experiencing depression and insomnia as well.  Procedural tasks and executive functioning were intact.  He was found to have a B12 level of 52 and was started on B12 shots.  He and his wife had noticed improvement in his memory.  However, insomnia and depression were still both an issue.  He had been under a lot of stress.  He was started on Celexa for depression and Proson for insomnia.  Last year, he exhibited extreme lethargy, to the point where he was sleeping during the day, which he never previously done.  Sometimes, he was in such a deep sleep, it required repeated yelling and shaking to wake him up.  He seemed more "foggy" again.  One time, he couldn't figure out where to put the ice cream in the refrigerator.  When his wife told him to put it in the freezer, he was placing it in the vegetable drawer.  He decided to stop the Proson, and he and his wife noticed significant improvement in lethargy.  He did note some improvement in memory since starting B12 supplementation.  Memory seemed worse after the stroke, so cognitive deficits were thought to be also related to stroke.  Since the stroke, he is more irritable, particularly because he doesn't feel as independent anymore.  MOCA from 12/27/13 was 26/30, which appeared stable.  He had formal neuropsych testing performed by Dr. Valentina Shaggy earlier this year.  He reportedly did well.  No follow up was recommended. He had a  formal driving evaluation in July 2015.  He did well.  He was told to drive with someone else in the car for a couple of months so he can regain confidence.  The only restriction was that he should not drive at night unless it is a well-lit road.  He has since started driving alone sometimes, but only locally and during daylight hours.    III Tremor Last year, he began noticing mild tremor in his hands, noticeable when he holds a utensil or writes.  He first noticed this about a month or so ago.  He has some balance  issues due to bilateral knee replacement surgeries.  No family history of dementia or tremor (possibly his mother had tremor).  He takes ropinirole  PAST MEDICAL HISTORY: Past Medical History  Diagnosis Date  . Hyperlipidemia   . HTN (hypertension)   . Status post placement of cardiac pacemaker DDD-  06-04-10-- LAST CHECK 09-08-10 IN EPIC    SECONDARY TO SYNCOPY AND BRADYCARDIA  . Restless leg syndrome   . Normal nuclear stress test 2008    LOW RISK--   DR  WALL  . Carotid stenosis, bilateral PER DR EARLY / DUPLEX  09-09-10      40 - 59%   BILATERALLY--- ASYMPTOMATIC  . History of echocardiogram 11-21-2006    EF 65%  . History of prostate cancer S/P RADIATION  TX  6 YRS AGO  . Psoriasis   . Arthritis with psoriasis   . GERD (gastroesophageal reflux disease)     CONTROLLED W/ PROTONIX  . Borderline diabetes     DIET CONTROLLED - PT STATES HE IS NOT DIABETIC  . Lumbar spondylosis W/ RADICULOPATHY  . Iron deficiency   . Cataract immature LEFT EYE  . PAF (paroxysmal atrial fibrillation)   . SVT (supraventricular tachycardia)     S/P ABLATION   2008  . Coronary artery disease CARDIOLOGIST- DR Crissie Sickles    09-08-10 VISIT AND PACEMAKER CHECK  IN EPIC  . Cancer     HX OF PROSTATE CANCER--TX'D WITH RADIATION  . Sleep apnea TESTED YRS AGO-- NO CPAP RX GIVEN    PT STATES HE DOES NOT THINK HE STILL HAS SLEEP APNEA--DOES NOT USE CPAP  . Stroke Oct. 14, 2014    MEDICATIONS: Current Outpatient Prescriptions on File Prior to Visit  Medication Sig Dispense Refill  . atorvastatin (LIPITOR) 20 MG tablet Take 20 mg by mouth daily.    . citalopram (CELEXA) 20 MG tablet Take 1.5 tablets (30 mg total) by mouth daily. 135 tablet 1  . clotrimazole-betamethasone (LOTRISONE) cream Apply 1 application topically 2 (two) times daily as needed (psoriasis).    . cyanocobalamin (,VITAMIN B-12,) 1000 MCG/ML injection Inject 54mL twice a month (Patient taking differently: Inject 54mL into the skin twice a  month) 10 mL 2  . docusate sodium (COLACE) 100 MG capsule Take 100 mg by mouth daily.    Marland Kitchen ezetimibe (ZETIA) 10 MG tablet Take 1 tablet (10 mg total) by mouth daily. 90 tablet 3  . furosemide (LASIX) 20 MG tablet Take 2 tablets (40 mg total) by mouth 2 (two) times daily. 360 tablet 3  . ipratropium (ATROVENT) 0.06 % nasal spray Place 2 sprays into both nostrils 4 (four) times daily. (Patient taking differently: Place 2 sprays into both nostrils 4 (four) times daily as needed for rhinitis. ) 45 mL 3  . iron polysaccharides (NIFEREX) 150 MG capsule Take 150 mg by mouth 2 (two) times daily.    Marland Kitchen  lisinopril (PRINIVIL,ZESTRIL) 10 MG tablet Take 1 tablet (10 mg total) by mouth daily. 30 tablet 0  . methotrexate (RHEUMATREX) 2.5 MG tablet Take 4 tablets (10 mg total) by mouth once a week. Caution:Chemotherapy. Protect from light. Every monday 4 tablet 1  . mirabegron ER (MYRBETRIQ) 50 MG TB24 tablet Take 1 tablet (50 mg total) by mouth daily. 90 tablet 3  . pantoprazole (PROTONIX) 40 MG tablet Take 1 tablet (40 mg total) by mouth daily. 90 tablet 3  . predniSONE (DELTASONE) 5 MG tablet Take 1.5 tablets (7.5 mg total) by mouth daily. 135 tablet 1  . rOPINIRole (REQUIP) 2 MG tablet TAKE 1 TABLET (2 MG TOTAL) BY MOUTH 2 (TWO) TIMES DAILY. 60 tablet 3  . solifenacin (VESICARE) 10 MG tablet Take 1 tablet (10 mg total) by mouth daily. Use vesicare or ditropan--not both 90 tablet 3  . SYRINGE/NEEDLE, DISP, 1 ML 25G X 5/8" 1 ML MISC Use to inject 57ml of B-12 every two weeks 100 each 1  . triamcinolone cream (KENALOG) 0.1 % Apply to affected area as directed (Patient taking differently: Apply 1 application topically daily as needed (rash). Apply to affected area as directed) 480 g 0  . XARELTO 20 MG TABS tablet TAKE 1 TABLET (20 MG TOTAL) BY MOUTH DAILY WITH SUPPER. THIS IS THE NEW MEDICATION FOR STROKE PREVENTION 30 tablet 3   No current facility-administered medications on file prior to visit.     ALLERGIES: Allergies  Allergen Reactions  . Ciprofloxacin Nausea Only    REACTION: GI upset  . Hydrocodone-Acetaminophen     Hallucinations in higher doses  . Other Other (See Comments)    SCALLOPS--SUDDEN GI ATTACK    FAMILY HISTORY: Family History  Problem Relation Age of Onset  . Stroke Mother   . Early death Father     SOCIAL HISTORY: History   Social History  . Marital Status: Married    Spouse Name: N/A  . Number of Children: N/A  . Years of Education: N/A   Occupational History  . retired    Social History Main Topics  . Smoking status: Former Smoker -- 0.10 packs/day for 55 years    Types: Cigarettes    Quit date: 12/07/1998  . Smokeless tobacco: Never Used  . Alcohol Use: Yes     Comment: RARE  . Drug Use: No  . Sexual Activity:    Partners: Female   Other Topics Concern  . Not on file   Social History Narrative    REVIEW OF SYSTEMS: Constitutional: No fevers, chills, or sweats, no generalized fatigue, change in appetite Eyes: No visual changes, double vision, eye pain Ear, nose and throat: No hearing loss, ear pain, nasal congestion, sore throat Cardiovascular: No chest pain, palpitations Respiratory:  No shortness of breath at rest or with exertion, wheezes GastrointestinaI: No nausea, vomiting, diarrhea, abdominal pain, fecal incontinence Genitourinary:  No dysuria, urinary retention or frequency Musculoskeletal:  No neck pain, back pain Integumentary: No rash, pruritus, skin lesions Neurological: as above Psychiatric: No depression, insomnia, anxiety Endocrine: No palpitations, fatigue, diaphoresis, mood swings, change in appetite, change in weight, increased thirst Hematologic/Lymphatic:  No anemia, purpura, petechiae. Allergic/Immunologic: no itchy/runny eyes, nasal congestion, recent allergic reactions, rashes  PHYSICAL EXAM: Filed Vitals:   10/15/14 1401  BP: 108/68  Pulse: 68  Resp: 18   General: No acute distress Head:   Normocephalic/atraumatic Eyes:  Fundoscopic exam unremarkable without vessel changes, exudates, hemorrhages or papilledema. Neck: supple, no paraspinal tenderness, full  range of motion Heart:  Regular rate and rhythm Lungs:  Clear to auscultation bilaterally Back: No paraspinal tenderness Neurological Exam: alert and oriented to person, place, and time. Attention span and concentration intact, delayed recall poor, remote memory intact, fund of knowledge intact.  Speech fluent and not dysarthric, language intact.   CN II-XII intact. Fundoscopic exam unremarkable without vessel changes, exudates, hemorrhages or papilledema.  Bulk and tone normal, muscle strength 5/5 throughout.  Reduced finger-thumb tapping speed bilaterally.  Finger to nose testing intact with fine postural and kinetic tremor.  Reduced pinprick and vibration sensation in feet.  Deep tendon reflexes trace throughout except absent in ankles and right patellars.  Gait normal stance and stride.  Unable to walk in tandem.  IMPRESSION: Cerebrovascular disease with recurrent TIA Mild cognitive impairment, possibly vascular related. Tremor  PLAN: Continue anticoagulation Blood pressure control Limit independent driving only locally during daylight hours.  For long-distance driving, should be accompanied by somebody else beside him in the car. Will review Dr. Monico Hoar evaluation again and determine if he should have a repeat neuropsychological evaluation. Follow up in 6 months.  30 minutes spent with patient, over 50% spent discussing plan.  Metta Clines, DO

## 2014-10-15 NOTE — Patient Instructions (Signed)
Follow up in 6 months Will get Dr. Monico Hoar note and get back to you if testing should be repeated.

## 2014-10-16 ENCOUNTER — Encounter: Payer: Self-pay | Admitting: Internal Medicine

## 2014-10-22 ENCOUNTER — Encounter: Payer: PPO | Attending: Physical Medicine & Rehabilitation

## 2014-10-22 ENCOUNTER — Ambulatory Visit (HOSPITAL_BASED_OUTPATIENT_CLINIC_OR_DEPARTMENT_OTHER): Payer: PPO | Admitting: Physical Medicine & Rehabilitation

## 2014-10-22 ENCOUNTER — Encounter: Payer: Self-pay | Admitting: Physical Medicine & Rehabilitation

## 2014-10-22 VITALS — BP 113/56 | HR 78 | Resp 14

## 2014-10-22 DIAGNOSIS — I69398 Other sequelae of cerebral infarction: Secondary | ICD-10-CM

## 2014-10-22 DIAGNOSIS — R269 Unspecified abnormalities of gait and mobility: Secondary | ICD-10-CM | POA: Diagnosis not present

## 2014-10-22 DIAGNOSIS — I69919 Unspecified symptoms and signs involving cognitive functions following unspecified cerebrovascular disease: Secondary | ICD-10-CM

## 2014-10-22 DIAGNOSIS — I6991 Cognitive deficits following unspecified cerebrovascular disease: Secondary | ICD-10-CM

## 2014-10-22 NOTE — Patient Instructions (Signed)
I believe the staring off type symptoms are mainly fatigue related. This is very common post stroke even in patients who have had good recoveries.  You need to be very careful on uneven surfaces because of residual balance problems from the stroke.

## 2014-10-22 NOTE — Progress Notes (Signed)
Subjective:    Patient ID: Christopher Baker, male    DOB: 12-Dec-1933, 81 y.o.   MRN: [id number]  HPI Driving on good days Still poor balance, bumping into door jam Problems with uneven surfaces, golfing again  He has followed up with both his PCP as well as his cardiologist in regards to his lower extremity edema. He is on Xarelto  He has not fallen recently but had a near fall on the golf course while stepping out of his cart  Pain Inventory Average Pain 0 Pain Right Now 2 My pain is dull and aching  In the last 24 hours, has pain interfered with the following? General activity 5 Relation with others 5 Enjoyment of life 5 What TIME of day is your pain at its worst? night Sleep (in general) Fair  Pain is worse with: walking, bending, sitting, inactivity, standing and some activites Pain improves with: rest, therapy/exercise and pacing activities Relief from Meds: 0  Mobility walk without assistance how many minutes can you walk? 30 ability to climb steps?  yes do you drive?  yes  Function retired  Neuro/Psych weakness dizziness  Prior Studies Any changes since last visit?  no  Physicians involved in your care Any changes since last visit?  no   Family History  Problem Relation Age of Onset  . Stroke Mother   . Early death Father    History   Social History  . Marital Status: Married    Spouse Name: N/A  . Number of Children: N/A  . Years of Education: N/A   Occupational History  . retired    Social History Main Topics  . Smoking status: Former Smoker -- 0.10 packs/day for 55 years    Types: Cigarettes    Quit date: 12/07/1998  . Smokeless tobacco: Never Used  . Alcohol Use: Yes     Comment: RARE  . Drug Use: No  . Sexual Activity:    Partners: Female   Other Topics Concern  . None   Social History Narrative   Past Surgical History  Procedure Laterality Date  . Replacement total knee  1997    RIGHT  . Cardiac pacemaker placement   06-04-10    DDD/ AND REMOVAL LOOR RECORDER  . Loop recorder placement  01-30-10  . Lumbar laminectomy/ diskectomy/ fusion  05-19-10    L4 - 5  . Cholecystectomy  2009  . Cardiac electrophysiology study and ablation  2008    FOR SVT  . Prostate biopsy  2007  . Transurethral resection of prostate  2006  . Inguinal hernia repair  2005    LEFT/   DONE WITH PENILE PROSTESIS SURG.  . Removal and placement penile prosthesis implant   2005    AND LEFT CORPOROPLASTY /    DONE INGUINAL REPAIR  . Penile prosthesis implant  1995  . Knee arthroscopy  06/02/2011    Procedure: ARTHROSCOPY KNEE;  Surgeon: Gearlean Alf;  Location: [facility];  Service: Orthopedics;  Laterality: Left;  LEFT KNEE ARTHROSCOPY WITH DEBRIDEMENT  . Total knee arthroplasty  12/20/2011    Procedure: TOTAL KNEE ARTHROPLASTY;  Surgeon: Gearlean Alf, MD;  Location: [facility];  Service: Orthopedics;  Laterality: Left;  . Back surgery    . Joint replacement    . Spine surgery     Past Medical History  Diagnosis Date  . Hyperlipidemia   . HTN (hypertension)   . Status post placement of cardiac pacemaker DDD-  06-04-10-- LAST CHECK 09-08-10 IN EPIC    SECONDARY TO SYNCOPY AND BRADYCARDIA  . Restless leg syndrome   . Normal nuclear stress test 2008    LOW RISK--   DR  WALL  . Carotid stenosis, bilateral PER DR EARLY / DUPLEX  09-09-10      40 - 59%   BILATERALLY--- ASYMPTOMATIC  . History of echocardiogram 11-21-2006    EF 65%  . History of prostate cancer S/P RADIATION  TX  6 YRS AGO  . Psoriasis   . Arthritis with psoriasis   . GERD (gastroesophageal reflux disease)     CONTROLLED W/ PROTONIX  . Borderline diabetes     DIET CONTROLLED - PT STATES HE IS NOT DIABETIC  . Lumbar spondylosis W/ RADICULOPATHY  . Iron deficiency   . Cataract immature LEFT EYE  . PAF (paroxysmal atrial fibrillation)   . SVT (supraventricular tachycardia)     S/P ABLATION   2008  . Coronary artery disease CARDIOLOGIST- DR  Crissie Sickles    09-08-10 VISIT AND PACEMAKER CHECK  IN EPIC  . Cancer     HX OF PROSTATE CANCER--TX'D WITH RADIATION  . Sleep apnea TESTED YRS AGO-- NO CPAP RX GIVEN    PT STATES HE DOES NOT THINK HE STILL HAS SLEEP APNEA--DOES NOT USE CPAP  . Stroke Oct. 14, 2014   BP 113/56 mmHg  Pulse 78  Resp 14  SpO2 98%  Opioid Risk Score:   Fall Risk Score: Low Fall Risk (0-5 points)`1  Depression screen PHQ 2/9  Depression screen [facility] 2/9 04/12/2014 06/04/2013  Decreased Interest 1 0  Down, Depressed, Hopeless 1 1  PHQ - 2 Score 2 1  Altered sleeping 1 -  Tired, decreased energy 1 -  Change in appetite 0 -  Feeling bad or failure about yourself  0 -  Trouble concentrating 1 -  Moving slowly or fidgety/restless 0 -  Suicidal thoughts 0 -  PHQ-9 Score 5 -     Review of Systems  Constitutional: Negative.   HENT: Negative.   Eyes: Negative.   Respiratory: Negative.   Cardiovascular: Positive for leg swelling.       Ankle swelling  Gastrointestinal: Negative.   Endocrine: Negative.   Genitourinary: Negative.   Musculoskeletal: Negative.   Skin: Negative.   Allergic/Immunologic: Negative.   Neurological: Positive for dizziness.  Hematological: Negative.   Psychiatric/Behavioral: Negative.        Objective:   Physical Exam  Constitutional: He is oriented to person, place, and time. He appears well-developed and well-nourished.  Neurological: He is alert and oriented to person, place, and time.  Psychiatric: He has a normal mood and affect.  Nursing note and vitals reviewed.  Motor strength is 5/5 bilateral deltoids, biceps, triceps, grip, hip flexor, knee extensor, ankle dorsal flexor plantar flexor Finger-thumb opposition is equal bilaterally No evidence of dysdiadochokinesis on rapid alternating supination and pronation bilateral upper extremities He has decreased rapidity of toe tapping on the left side compared to the right side  Visual fields are intact  confrontation testing Responses are mildly delayed otherwise mood and affect are without evidence of lability or agitation The patient ambulates without evidence of toe drag or knee instability is able to toe walk and heel walk has difficulty with tandem gait     Assessment & Plan:  1. History of right MCA distribution infarct. He's had excellent recovery of his hemiparesis as well as left neglect. He has some residual balance deficits as  well as residual higher-level cognitive deficits. We discussed that his balance would be more affected on uneven surfaces such as on the golf course. We also discussed how before he gets out of the cart he has to survey the terrain to make sure he is not on a slope.We discussed that these deficits that are residual from the stroke aren't likely to be permanent. Also discussed recommendations to continue general conditioning program that he performs with the assistance of a trainer  Patient will follow up with neurology as well as with cardiology and primary care physician  Physical medicine and rehabilitation follow-up on as-needed basis

## 2014-10-24 ENCOUNTER — Telehealth: Payer: Self-pay | Admitting: Family

## 2014-10-24 DIAGNOSIS — R609 Edema, unspecified: Secondary | ICD-10-CM

## 2014-10-24 NOTE — Telephone Encounter (Signed)
Since Christopher Baker's (and my) appts. with you April 22, he has been on 40-80 mg. of Lasix daily (pretty much my decision on how much each day).  He has lost 12.5 lbs.  Legs look better, feet are still very swollen.  Christopher Baker continues to battle dizziness, light-headedness. [facility]. Dr. Wynelle Link talked with our joint friend, Christopher Baker's trainer/owner at [employer].  Both concerned Christopher Baker is not seeing a lymphedema specialist and recommended  [facility] for thorough examination and possible treatment.  I am turning to you for advice, and possible referral for [facility]    Thanks for "listening" and advice.  Question #2:  Christopher Baker continuing (off and on) to battle constipation.  He is now taking one Colace each night.  Christopher Baker on me that he is taking two iron pills (one a.m. and one p.m.) daily.  If I recall correctly, iron can cause constipation.  Does he need two a day at this point?  Dr. Arnoldo Morale put him on iron pills -- we think -- before diagnosing his severe B-12 deficiency.  Pls. advise.   RESPONSE: I have advised that Christopher Baker cannot send msgs through her mychart account. I will make referral to Lymphedema specialist as requested. I truly believe his issues is volume overload not lymphedema but we can consult. Decrease iron to once a day.

## 2014-10-28 ENCOUNTER — Ambulatory Visit: Payer: Commercial Managed Care - HMO | Admitting: Pulmonary Disease

## 2014-10-29 ENCOUNTER — Encounter: Payer: Self-pay | Admitting: Internal Medicine

## 2014-10-31 ENCOUNTER — Encounter (HOSPITAL_COMMUNITY): Payer: Self-pay | Admitting: *Deleted

## 2014-10-31 ENCOUNTER — Emergency Department (HOSPITAL_COMMUNITY)
Admission: EM | Admit: 2014-10-31 | Discharge: 2014-10-31 | Disposition: A | Discharge status: Home / Self Care | Payer: PPO | Attending: Emergency Medicine | Admitting: Emergency Medicine

## 2014-10-31 ENCOUNTER — Emergency Department (HOSPITAL_COMMUNITY): Payer: PPO

## 2014-10-31 DIAGNOSIS — E611 Iron deficiency: Secondary | ICD-10-CM | POA: Insufficient documentation

## 2014-10-31 DIAGNOSIS — K219 Gastro-esophageal reflux disease without esophagitis: Secondary | ICD-10-CM | POA: Diagnosis not present

## 2014-10-31 DIAGNOSIS — I10 Essential (primary) hypertension: Secondary | ICD-10-CM | POA: Diagnosis not present

## 2014-10-31 DIAGNOSIS — Z8739 Personal history of other diseases of the musculoskeletal system and connective tissue: Secondary | ICD-10-CM | POA: Diagnosis not present

## 2014-10-31 DIAGNOSIS — Z8546 Personal history of malignant neoplasm of prostate: Secondary | ICD-10-CM | POA: Diagnosis not present

## 2014-10-31 DIAGNOSIS — Z950 Presence of cardiac pacemaker: Secondary | ICD-10-CM | POA: Insufficient documentation

## 2014-10-31 DIAGNOSIS — G459 Transient cerebral ischemic attack, unspecified: Secondary | ICD-10-CM | POA: Insufficient documentation

## 2014-10-31 DIAGNOSIS — I2510 Atherosclerotic heart disease of native coronary artery without angina pectoris: Secondary | ICD-10-CM | POA: Diagnosis not present

## 2014-10-31 DIAGNOSIS — E785 Hyperlipidemia, unspecified: Secondary | ICD-10-CM | POA: Diagnosis not present

## 2014-10-31 DIAGNOSIS — Z8673 Personal history of transient ischemic attack (TIA), and cerebral infarction without residual deficits: Secondary | ICD-10-CM | POA: Insufficient documentation

## 2014-10-31 DIAGNOSIS — Z79899 Other long term (current) drug therapy: Secondary | ICD-10-CM | POA: Insufficient documentation

## 2014-10-31 DIAGNOSIS — Z872 Personal history of diseases of the skin and subcutaneous tissue: Secondary | ICD-10-CM | POA: Insufficient documentation

## 2014-10-31 DIAGNOSIS — M6281 Muscle weakness (generalized): Secondary | ICD-10-CM | POA: Diagnosis present

## 2014-10-31 DIAGNOSIS — Z87891 Personal history of nicotine dependence: Secondary | ICD-10-CM | POA: Insufficient documentation

## 2014-10-31 DIAGNOSIS — Z7952 Long term (current) use of systemic steroids: Secondary | ICD-10-CM | POA: Insufficient documentation

## 2014-10-31 LAB — I-STAT CHEM 8, ED
BUN: 29 mg/dL — ABNORMAL HIGH (ref 6–20)
Calcium, Ion: 1.22 mmol/L (ref 1.13–1.30)
Chloride: 104 mmol/L (ref 101–111)
Creatinine, Ser: 1.6 mg/dL — ABNORMAL HIGH (ref 0.61–1.24)
GLUCOSE: 122 mg/dL — AB (ref 65–99)
HCT: 32 % — ABNORMAL LOW (ref 39.0–52.0)
HEMOGLOBIN: 10.9 g/dL — AB (ref 13.0–17.0)
POTASSIUM: 3.9 mmol/L (ref 3.5–5.1)
Sodium: 142 mmol/L (ref 135–145)
TCO2: 24 mmol/L (ref 0–100)

## 2014-10-31 LAB — CBC
HCT: 32 % — ABNORMAL LOW (ref 39.0–52.0)
Hemoglobin: 9.8 g/dL — ABNORMAL LOW (ref 13.0–17.0)
MCH: 25.5 pg — ABNORMAL LOW (ref 26.0–34.0)
MCHC: 30.6 g/dL (ref 30.0–36.0)
MCV: 83.1 fL (ref 78.0–100.0)
PLATELETS: 250 10*3/uL (ref 150–400)
RBC: 3.85 MIL/uL — AB (ref 4.22–5.81)
RDW: 16.5 % — ABNORMAL HIGH (ref 11.5–15.5)
WBC: 11.4 10*3/uL — AB (ref 4.0–10.5)

## 2014-10-31 LAB — COMPREHENSIVE METABOLIC PANEL
ALT: 23 U/L (ref 17–63)
ANION GAP: 10 (ref 5–15)
AST: 24 U/L (ref 15–41)
Albumin: 3.6 g/dL (ref 3.5–5.0)
Alkaline Phosphatase: 64 U/L (ref 38–126)
BUN: 30 mg/dL — ABNORMAL HIGH (ref 6–20)
CO2: 24 mmol/L (ref 22–32)
CREATININE: 1.61 mg/dL — AB (ref 0.61–1.24)
Calcium: 9 mg/dL (ref 8.9–10.3)
Chloride: 105 mmol/L (ref 101–111)
GFR calc Af Amer: 45 mL/min — ABNORMAL LOW (ref >60)
GFR calc non Af Amer: 39 mL/min — ABNORMAL LOW (ref >60)
Glucose, Bld: 125 mg/dL — ABNORMAL HIGH (ref 65–99)
Potassium: 3.9 mmol/L (ref 3.5–5.1)
Sodium: 139 mmol/L (ref 135–145)
TOTAL PROTEIN: 5.7 g/dL — AB (ref 6.5–8.1)
Total Bilirubin: 0.4 mg/dL (ref 0.3–1.2)

## 2014-10-31 LAB — DIFFERENTIAL
BASOS ABS: 0 10*3/uL (ref 0.0–0.1)
BASOS PCT: 0 % (ref 0–1)
Eosinophils Absolute: 0 10*3/uL (ref 0.0–0.7)
Eosinophils Relative: 0 % (ref 0–5)
LYMPHS PCT: 7 % — AB (ref 12–46)
Lymphs Abs: 0.8 10*3/uL (ref 0.7–4.0)
MONO ABS: 1.1 10*3/uL — AB (ref 0.1–1.0)
Monocytes Relative: 10 % (ref 3–12)
Neutro Abs: 9.4 10*3/uL — ABNORMAL HIGH (ref 1.7–7.7)
Neutrophils Relative %: 83 % — ABNORMAL HIGH (ref 43–77)

## 2014-10-31 LAB — APTT: APTT: 29 s (ref 24–37)

## 2014-10-31 LAB — I-STAT TROPONIN, ED: Troponin i, poc: 0.01 ng/mL (ref 0.00–0.08)

## 2014-10-31 LAB — PROTIME-INR
INR: 1.78 — ABNORMAL HIGH (ref 0.00–1.49)
Prothrombin Time: 20.9 seconds — ABNORMAL HIGH (ref 11.6–15.2)

## 2014-10-31 MED ORDER — SODIUM CHLORIDE 0.9 % IV BOLUS (SEPSIS)
500.0000 mL | Freq: Once | INTRAVENOUS | Status: AC
  Administered 2014-10-31: 500 mL via INTRAVENOUS

## 2014-10-31 NOTE — ED Notes (Addendum)
Per pt and wife: pt was trying to get something out of a cabinet and was unable to keep his left arm up. Wife states symptoms started at 48. Wife reported some left side facial droop at 1915. Pt has hx of right side brain stroke in 2014, deficits include memory loss and balance. Pt presents with equal grips, no facial droop, equal strength in all extremities. Pt is Pt A&Ox4, respirations equal and unlabored, skin warm and dry

## 2014-10-31 NOTE — ED Notes (Signed)
MD at bedside. 

## 2014-10-31 NOTE — ED Notes (Signed)
Pt. Is going to CT. 

## 2014-10-31 NOTE — Discharge Instructions (Signed)
Transient Ischemic Attack  A transient ischemic attack (TIA) is a "warning stroke" that causes stroke-like symptoms. Unlike a stroke, a TIA does not cause permanent damage to the brain. The symptoms of a TIA can happen very fast and do not last long. It is important to know the symptoms of a TIA and what to do. This can help prevent a major stroke or death.  CAUSES   · A TIA is caused by a temporary blockage in an artery in the brain or neck (carotid artery). The blockage does not allow the brain to get the blood supply it needs and can cause different symptoms. The blockage can be caused by either:  ¨ A blood clot.  ¨ Fatty buildup (plaque) in a neck or brain artery.  RISK FACTORS  · High blood pressure (hypertension).  · High cholesterol.  · Diabetes mellitus.  · Heart disease.  · The build up of plaque in the blood vessels (peripheral artery disease or atherosclerosis).  · The build up of plaque in the blood vessels providing blood and oxygen to the brain (carotid artery stenosis).  · An abnormal heart rhythm (atrial fibrillation).  · Obesity.  · Smoking.  · Taking oral contraceptives (especially in combination with smoking).  · Physical inactivity.  · A diet high in fats, salt (sodium), and calories.  · Alcohol use.  · Use of illegal drugs (especially cocaine and methamphetamine).  · Being male.  · Being African American.  · Being over the age of 55.  · Family history of stroke.  · Previous history of blood clots, stroke, TIA, or heart attack.  · Sickle cell disease.  SYMPTOMS   TIA symptoms are the same as a stroke but are temporary. These symptoms usually develop suddenly, or may be newly present upon awakening from sleep:  · Sudden weakness or numbness of the face, arm, or leg, especially on one side of the body.  · Sudden trouble walking or difficulty moving arms or legs.  · Sudden confusion.  · Sudden personality changes.  · Trouble speaking (aphasia) or understanding.  · Difficulty swallowing.  · Sudden  trouble seeing in one or both eyes.  · Double vision.  · Dizziness.  · Loss of balance or coordination.  · Sudden severe headache with no known cause.  · Trouble reading or writing.  · Loss of bowel or bladder control.  · Loss of consciousness.  DIAGNOSIS   Your caregiver may be able to determine the presence or absence of a TIA based on your symptoms, history, and physical exam. Computed tomography (CT scan) of the brain is usually performed to help identify a TIA. Other tests may be done to diagnose a TIA. These tests may include:  · Electrocardiography.  · Continuous heart monitoring.  · Echocardiography.  · Carotid ultrasonography.  · Magnetic resonance imaging (MRI).  · A scan of the brain circulation.  · Blood tests.  PREVENTION   The risk of a TIA can be decreased by appropriately treating high blood pressure, high cholesterol, diabetes, heart disease, and obesity and by quitting smoking, limiting alcohol, and staying physically active.  TREATMENT   Time is of the essence. Since the symptoms of TIA are the same as a stroke, it is important to seek treatment as soon as possible because you may need a medicine to dissolve the clot (thrombolytic) that cannot be given if too much time has passed. Treatment options vary. Treatment options may include rest, oxygen, intravenous (IV) fluids,   and medicines to thin the blood (anticoagulants). Medicines and diet may be used to address diabetes, high blood pressure, and other risk factors. Measures will be taken to prevent short-term and long-term complications, including infection from breathing foreign material into the lungs (aspiration pneumonia), blood clots in the legs, and falls. Treatment options include procedures to either remove plaque in the carotid arteries or dilate carotid arteries that have narrowed due to plaque. Those procedures are:  · Carotid endarterectomy.  · Carotid angioplasty and stenting.  HOME CARE INSTRUCTIONS   · Take all medicines prescribed  by your caregiver. Follow the directions carefully. Medicines may be used to control risk factors for a stroke. Be sure you understand all your medicine instructions.  · You may be told to take aspirin or the anticoagulant warfarin. Warfarin needs to be taken exactly as instructed.  ¨ Taking too much or too little warfarin is dangerous. Too much warfarin increases the risk of bleeding. Too little warfarin continues to allow the risk for blood clots. While taking warfarin, you will need to have regular blood tests to measure your blood clotting time. A PT blood test measures how long it takes for blood to clot. Your PT is used to calculate another value called an INR. Your PT and INR help your caregiver to adjust your dose of warfarin. The dose can change for many reasons. It is critically important that you take warfarin exactly as prescribed.  ¨ Many foods, especially foods high in vitamin K can interfere with warfarin and affect the PT and INR. Foods high in vitamin K include spinach, kale, broccoli, cabbage, collard and turnip greens, brussels sprouts, peas, cauliflower, seaweed, and parsley as well as beef and pork liver, green tea, and soybean oil. You should eat a consistent amount of foods high in vitamin K. Avoid major changes in your diet, or notify your caregiver before changing your diet. Arrange a visit with a dietitian to answer your questions.  ¨ Many medicines can interfere with warfarin and affect the PT and INR. You must tell your caregiver about any and all medicines you take, this includes all vitamins and supplements. Be especially cautious with aspirin and anti-inflammatory medicines. Do not take or discontinue any prescribed or over-the-counter medicine except on the advice of your caregiver or pharmacist.  ¨ Warfarin can have side effects, such as excessive bruising or bleeding. You will need to hold pressure over cuts for longer than usual. Your caregiver or pharmacist will discuss other  potential side effects.  ¨ Avoid sports or activities that may cause injury or bleeding.  ¨ Be mindful when shaving, flossing your teeth, or handling sharp objects.  ¨ Alcohol can change the body's ability to handle warfarin. It is best to avoid alcoholic drinks or consume only very small amounts while taking warfarin. Notify your caregiver if you change your alcohol intake.  ¨ Notify your dentist or other caregivers before procedures.  · Eat a diet that includes 5 or more servings of fruits and vegetables each day. This may reduce the risk of stroke. Certain diets may be prescribed to address high blood pressure, high cholesterol, diabetes, or obesity.  ¨ A low-sodium, low-saturated fat, low-trans fat, low-cholesterol diet is recommended to manage high blood pressure.  ¨ A low-saturated fat, low-trans fat, low-cholesterol, and high-fiber diet may control cholesterol levels.  ¨ A controlled-carbohydrate, controlled-sugar diet is recommended to manage diabetes.  ¨ A reduced-calorie, low-sodium, low-saturated fat, low-trans fat, low-cholesterol diet is recommended to manage obesity.  ·   Maintain a healthy weight.  · Stay physically active. It is recommended that you get at least 30 minutes of activity on most or all days.  · Do not smoke.  · Limit alcohol use even if you are not taking warfarin. Moderate alcohol use is considered to be:  ¨ No more than 2 drinks each day for men.  ¨ No more than 1 drink each day for nonpregnant women.  · Stop drug abuse.  · Home safety. A safe home environment is important to reduce the risk of falls. Your caregiver may arrange for specialists to evaluate your home. Having grab bars in the bedroom and bathroom is often important. Your caregiver may arrange for equipment to be used at home, such as raised toilets and a seat for the shower.  · Follow all instructions for follow-up with your caregiver. This is very important. This includes any referrals and lab tests. Proper follow up can  prevent a stroke or another TIA from occurring.  SEEK MEDICAL CARE IF:  · You have personality changes.  · You have difficulty swallowing.  · You are seeing double.  · You have dizziness.  · You have a fever.  · You have skin breakdown.  SEEK IMMEDIATE MEDICAL CARE IF:   Any of these symptoms may represent a serious problem that is an emergency. Do not wait to see if the symptoms will go away. Get medical help right away. Call your local emergency services (911 in [place] Do not drive yourself to the hospital.  · You have sudden weakness or numbness of the face, arm, or leg, especially on one side of the body.  · You have sudden trouble walking or difficulty moving arms or legs.  · You have sudden confusion.  · You have trouble speaking (aphasia) or understanding.  · You have sudden trouble seeing in one or both eyes.  · You have a loss of balance or coordination.  · You have a sudden, severe headache with no known cause.  · You have new chest pain or an irregular heartbeat.  · You have a partial or total loss of consciousness.  MAKE SURE YOU:   · Understand these instructions.  · Will watch your condition.  · Will get help right away if you are not doing well or get worse.  Document Released: 03/17/2005 Document Revised: 06/12/2013 Document Reviewed: 09/12/2013  [employer]® Patient Information ©2015 [employer]. This information is not intended to replace advice given to you by your health care provider. Make sure you discuss any questions you have with your health care provider.

## 2014-11-04 ENCOUNTER — Other Ambulatory Visit: Payer: Self-pay | Admitting: *Deleted

## 2014-11-04 DIAGNOSIS — R609 Edema, unspecified: Secondary | ICD-10-CM

## 2014-11-06 NOTE — ED Provider Notes (Signed)
CSN: [id number]     Arrival date & time 10/31/14  2031 History   First MD Initiated Contact with Patient 10/31/14 2035     Chief Complaint  Patient presents with  . Extremity Weakness     (Consider location/radiation/quality/duration/timing/severity/associated sxs/prior Treatment) HPI   81 year old male with left upper extremity weakness. Symptom onset shortly after 1900 today. Proximal left arm weakness. Also questionable facial droop. Symptoms have resolved. Currently no complaints. No change in speech or mentation during this per wife. Patient with a past history of stroke. Repeat evaluation this past December while admitted. He is currently answer relative. Reports compliance with his medications.  Past Medical History  Diagnosis Date  . Hyperlipidemia   . HTN (hypertension)   . Status post placement of cardiac pacemaker DDD-  06-04-10-- LAST CHECK 09-08-10 IN EPIC    SECONDARY TO SYNCOPY AND BRADYCARDIA  . Restless leg syndrome   . Normal nuclear stress test 2008    LOW RISK--   DR  WALL  . Carotid stenosis, bilateral PER DR EARLY / DUPLEX  09-09-10      40 - 59%   BILATERALLY--- ASYMPTOMATIC  . History of echocardiogram 11-21-2006    EF 65%  . History of prostate cancer S/P RADIATION  TX  6 YRS AGO  . Psoriasis   . Arthritis with psoriasis   . GERD (gastroesophageal reflux disease)     CONTROLLED W/ PROTONIX  . Borderline diabetes     DIET CONTROLLED - PT STATES HE IS NOT DIABETIC  . Lumbar spondylosis W/ RADICULOPATHY  . Iron deficiency   . Cataract immature LEFT EYE  . PAF (paroxysmal atrial fibrillation)   . SVT (supraventricular tachycardia)     S/P ABLATION   2008  . Coronary artery disease CARDIOLOGIST- DR Crissie Sickles    09-08-10 VISIT AND PACEMAKER CHECK  IN EPIC  . Cancer     HX OF PROSTATE CANCER--TX'D WITH RADIATION  . Sleep apnea TESTED YRS AGO-- NO CPAP RX GIVEN    PT STATES HE DOES NOT THINK HE STILL HAS SLEEP APNEA--DOES NOT USE CPAP  . Stroke Oct.  14, 2014   Past Surgical History  Procedure Laterality Date  . Replacement total knee  1997    RIGHT  . Cardiac pacemaker placement  06-04-10    DDD/ AND REMOVAL LOOR RECORDER  . Loop recorder placement  01-30-10  . Lumbar laminectomy/ diskectomy/ fusion  05-19-10    L4 - 5  . Cholecystectomy  2009  . Cardiac electrophysiology study and ablation  2008    FOR SVT  . Prostate biopsy  2007  . Transurethral resection of prostate  2006  . Inguinal hernia repair  2005    LEFT/   DONE WITH PENILE PROSTESIS SURG.  . Removal and placement penile prosthesis implant   2005    AND LEFT CORPOROPLASTY /    DONE INGUINAL REPAIR  . Penile prosthesis implant  1995  . Knee arthroscopy  06/02/2011    Procedure: ARTHROSCOPY KNEE;  Surgeon: Gearlean Alf;  Location: [facility];  Service: Orthopedics;  Laterality: Left;  LEFT KNEE ARTHROSCOPY WITH DEBRIDEMENT  . Total knee arthroplasty  12/20/2011    Procedure: TOTAL KNEE ARTHROPLASTY;  Surgeon: Gearlean Alf, MD;  Location: [facility];  Service: Orthopedics;  Laterality: Left;  . Back surgery    . Joint replacement    . Spine surgery     Family History  Problem Relation Age of Onset  .  Stroke Mother   . Early death Father    History  Substance Use Topics  . Smoking status: Former Smoker -- 0.10 packs/day for 55 years    Types: Cigarettes    Quit date: 12/07/1998  . Smokeless tobacco: Never Used  . Alcohol Use: Yes     Comment: RARE    Review of Systems  All systems reviewed and negative, other than as noted in HPI.   Allergies  Ciprofloxacin; Hydrocodone-acetaminophen; and Other  Home Medications   Prior to Admission medications   Medication Sig Start Date End Date Taking? Authorizing Provider  atorvastatin (LIPITOR) 20 MG tablet Take 20 mg by mouth daily.    Historical Provider, MD  citalopram (CELEXA) 20 MG tablet Take 1.5 tablets (30 mg total) by mouth daily. 05/14/14   Kennyth Arnold, FNP   clotrimazole-betamethasone (LOTRISONE) cream Apply 1 application topically 2 (two) times daily as needed (psoriasis).    Historical Provider, MD  cyanocobalamin (,VITAMIN B-12,) 1000 MCG/ML injection Inject 40mL twice a month Patient taking differently: Inject 48mL into the skin twice a month 08/01/14   Kennyth Arnold, FNP  docusate sodium (COLACE) 100 MG capsule Take 100 mg by mouth daily.    Historical Provider, MD  ezetimibe (ZETIA) 10 MG tablet Take 1 tablet (10 mg total) by mouth daily. 08/22/13   Ricard Dillon, MD  furosemide (LASIX) 20 MG tablet Take 2 tablets (40 mg total) by mouth 2 (two) times daily. 10/15/14   Evans Lance, MD  ipratropium (ATROVENT) 0.06 % nasal spray Place 2 sprays into both nostrils 4 (four) times daily. Patient taking differently: Place 2 sprays into both nostrils 4 (four) times daily as needed for rhinitis.  08/22/13   Ricard Dillon, MD  iron polysaccharides (NIFEREX) 150 MG capsule Take 150 mg by mouth 2 (two) times daily.    Historical Provider, MD  lisinopril (PRINIVIL,ZESTRIL) 10 MG tablet Take 1 tablet (10 mg total) by mouth daily. 06/20/14   Belkys A Regalado, MD  methotrexate (RHEUMATREX) 2.5 MG tablet Take 4 tablets (10 mg total) by mouth once a week. Caution:Chemotherapy. Protect from light. Every monday 07/05/14   Kennyth Arnold, FNP  mirabegron ER (MYRBETRIQ) 50 MG TB24 tablet Take 1 tablet (50 mg total) by mouth daily. 06/29/13   Ricard Dillon, MD  pantoprazole (PROTONIX) 40 MG tablet Take 1 tablet (40 mg total) by mouth daily. 08/08/14   Kennyth Arnold, FNP  predniSONE (DELTASONE) 5 MG tablet Take 1.5 tablets (7.5 mg total) by mouth daily. 06/17/14   Kennyth Arnold, FNP  rOPINIRole (REQUIP) 2 MG tablet TAKE 1 TABLET (2 MG TOTAL) BY MOUTH 2 (TWO) TIMES DAILY. 08/26/14   Kennyth Arnold, FNP  solifenacin (VESICARE) 10 MG tablet Take 1 tablet (10 mg total) by mouth daily. Use vesicare or ditropan--not both 06/29/13   Ricard Dillon, MD  SYRINGE/NEEDLE, DISP, 1 ML 25G  X 5/8" 1 ML MISC Use to inject 56ml of B-12 every two weeks 07/25/14   Kennyth Arnold, FNP  triamcinolone cream (KENALOG) 0.1 % Apply to affected area as directed Patient taking differently: Apply 1 application topically daily as needed (rash). Apply to affected area as directed 04/03/14   Kennyth Arnold, FNP  XARELTO 20 MG TABS tablet TAKE 1 TABLET (20 MG TOTAL) BY MOUTH DAILY WITH SUPPER. THIS IS THE NEW MEDICATION FOR STROKE PREVENTION 09/02/14   Kennyth Arnold, FNP   BP 145/55 mmHg  Pulse 60  Temp(Src) 98.1 F (36.7 C) (Oral)  Resp 20  SpO2 99% Physical Exam  Constitutional: He appears well-developed and well-nourished. No distress.  HENT:  Head: Normocephalic and atraumatic.  Eyes: Conjunctivae are normal. Right eye exhibits no discharge. Left eye exhibits no discharge.  Neck: Neck supple.  Cardiovascular: Normal rate, regular rhythm and normal heart sounds.  Exam reveals no gallop and no friction rub.   No murmur heard. Pulmonary/Chest: Effort normal and breath sounds normal. No respiratory distress.  Abdominal: Soft. He exhibits no distension. There is no tenderness.  Musculoskeletal: He exhibits no edema or tenderness.  Neurological: He is alert. No cranial nerve deficit. He exhibits normal muscle tone. Coordination normal.  Speech clear. Content appropriate. Follows commands. No pronator drift. Good finger to nose testing bilaterally. No focal motor deficit. Sensation is intact to light touch. Gait is steady.  Skin: Skin is warm and dry.  Psychiatric: He has a normal mood and affect. His behavior is normal. Thought content normal.  Nursing note and vitals reviewed.   ED Course  Procedures (including critical care time) Labs Review Labs Reviewed  PROTIME-INR - Abnormal; Notable for the following:    Prothrombin Time 20.9 (*)    INR 1.78 (*)    All other components within normal limits  CBC - Abnormal; Notable for the following:    WBC 11.4 (*)    RBC 3.85 (*)    Hemoglobin  9.8 (*)    HCT 32.0 (*)    MCH 25.5 (*)    RDW 16.5 (*)    All other components within normal limits  DIFFERENTIAL - Abnormal; Notable for the following:    Neutrophils Relative % 83 (*)    Neutro Abs 9.4 (*)    Lymphocytes Relative 7 (*)    Monocytes Absolute 1.1 (*)    All other components within normal limits  COMPREHENSIVE METABOLIC PANEL - Abnormal; Notable for the following:    Glucose, Bld 125 (*)    BUN 30 (*)    Creatinine, Ser 1.61 (*)    Total Protein 5.7 (*)    GFR calc non Af Amer 39 (*)    GFR calc Af Amer 45 (*)    All other components within normal limits  I-STAT CHEM 8, ED - Abnormal; Notable for the following:    BUN 29 (*)    Creatinine, Ser 1.60 (*)    Glucose, Bld 122 (*)    Hemoglobin 10.9 (*)    HCT 32.0 (*)    All other components within normal limits  APTT  I-STAT TROPOININ, ED    Imaging Review No results found.   EKG Interpretation   Date/Time:  Thursday Oct 31 2014 20:50:07 EDT Ventricular Rate:  60 PR Interval:  430 QRS Duration: 99 QT Interval:  414 QTC Calculation: 414 R Axis:   22 Text Interpretation:  Sinus rhythm Prolonged PR interval Borderline  repolarization abnormality ED PHYSICIAN INTERPRETATION AVAILABLE IN [facility] Confirmed by TEST, Record ([id number]) on 11/02/2014 5:15:54 PM      MDM   Final diagnoses:  Transient cerebral ischemia, unspecified transient cerebral ischemia type    81 year old male with transient left upper extremity weakness which has since resolved and has not recurred. Past history of CVA. Recent admission this past December and had echo and carotid Dopplers. He is on Xarelto. CT of the head does not show anything acute today. Probably not much utility in admitting patient to the hospital at this time with results symptoms  and recent workup. Continued Xarelto and outpatient follow-up.It has been determined that no acute conditions requiring further emergency intervention are present at this time. The  patient has been advised of the diagnosis and plan. I reviewed any labs and imaging including any potential incidental findings. We have discussed signs and symptoms that warrant return to the ED and they are listed in the discharge instructions.      Virgel Manifold, MD 11/06/14 1359

## 2014-11-12 ENCOUNTER — Ambulatory Visit: Payer: PPO | Admitting: Pulmonary Disease

## 2014-11-14 ENCOUNTER — Encounter: Payer: Self-pay | Admitting: Pulmonary Disease

## 2014-11-14 ENCOUNTER — Ambulatory Visit (INDEPENDENT_AMBULATORY_CARE_PROVIDER_SITE_OTHER): Payer: PPO | Admitting: Pulmonary Disease

## 2014-11-14 VITALS — BP 120/60 | HR 88 | Temp 98.3°F | Ht 75.0 in | Wt 221.0 lb

## 2014-11-14 DIAGNOSIS — G4733 Obstructive sleep apnea (adult) (pediatric): Secondary | ICD-10-CM | POA: Diagnosis not present

## 2014-11-14 NOTE — Patient Instructions (Signed)
Will arrange for mask fitting session at the sleep center.  Please call if you continue to struggle with cpap followup again with Dr. Halford Chessman in 22mos

## 2014-11-14 NOTE — Assessment & Plan Note (Signed)
The patient has been very compliant with C Pap by his download, but continues to struggle with his mask fit and excessive leaking. His download shows breakthrough events as expected with his mask leak, but the majority of them are central and nature. He did have a small to moderate number of central apneas on his original study, and has had a stroke in the past which may predispose him to central events. For now, I would like to focus on getting a good fitting mass that is comfortable for him, and see if we can adequately control his apnea with a standard C Pap device.

## 2014-11-14 NOTE — Progress Notes (Signed)
   Subjective:    Patient ID: Christopher Baker, male    DOB: 11/06/1933, 81 y.o.   MRN: [id number]  HPI Patient comes in today for follow-up of his obstructive sleep apnea. He is wearing C Pap compliantly by his download, however is having significant mask leak which is leading to breakthrough events.  Surprisingly, the majority of his breakthrough events are central in nature. The patient admits that he is having significant issues with mask fit, and it is very uncomfortable. This is keeping him from wearing the mask consistently all night. When he is able to wear the mass the majority of the night, he sees a definite improvement in his sleep and daytime alertness.   Review of Systems  Constitutional: Negative for fever and unexpected weight change.  HENT: Positive for postnasal drip. Negative for congestion, dental problem, ear pain, nosebleeds, rhinorrhea, sinus pressure, sneezing, sore throat and trouble swallowing.   Eyes: Negative for redness and itching.  Respiratory: Negative for cough, chest tightness, shortness of breath and wheezing.   Cardiovascular: Positive for leg swelling. Negative for palpitations.  Gastrointestinal: Negative for nausea and vomiting.  Genitourinary: Negative for dysuria.  Musculoskeletal: Negative for joint swelling.  Skin: Negative for rash.  Neurological: Negative for headaches.  Hematological: Bruises/bleeds easily.  Psychiatric/Behavioral: Negative for dysphoric mood. The patient is not nervous/anxious.        Objective:   Physical Exam Well-developed male in no acute distress Nose without purulence or discharge noted Neck without lymphadenopathy or thyromegaly No skin breakdown or pressure necrosis from the C Pap mask Lower extremities with 1+ edema bilaterally, no cyanosis Alert and oriented, moves all 4 extremities.       Assessment & Plan:

## 2014-11-25 ENCOUNTER — Ambulatory Visit (HOSPITAL_BASED_OUTPATIENT_CLINIC_OR_DEPARTMENT_OTHER): Payer: PPO | Attending: Pulmonary Disease | Admitting: Radiology

## 2014-11-25 DIAGNOSIS — G4733 Obstructive sleep apnea (adult) (pediatric): Secondary | ICD-10-CM

## 2014-11-25 DIAGNOSIS — Z9989 Dependence on other enabling machines and devices: Principal | ICD-10-CM

## 2014-12-10 ENCOUNTER — Encounter: Payer: Self-pay | Admitting: Family

## 2014-12-11 ENCOUNTER — Inpatient Hospital Stay (HOSPITAL_COMMUNITY)
Admission: EM | Admit: 2014-12-11 | Discharge: 2014-12-13 | DRG: 378 | Disposition: A | Discharge status: Home / Self Care | Payer: PPO | Attending: Internal Medicine | Admitting: Internal Medicine

## 2014-12-11 ENCOUNTER — Inpatient Hospital Stay (HOSPITAL_COMMUNITY): Payer: PPO

## 2014-12-11 ENCOUNTER — Encounter (HOSPITAL_COMMUNITY): Payer: Self-pay | Admitting: Nurse Practitioner

## 2014-12-11 DIAGNOSIS — K922 Gastrointestinal hemorrhage, unspecified: Principal | ICD-10-CM | POA: Diagnosis present

## 2014-12-11 DIAGNOSIS — R911 Solitary pulmonary nodule: Secondary | ICD-10-CM | POA: Diagnosis not present

## 2014-12-11 DIAGNOSIS — G2581 Restless legs syndrome: Secondary | ICD-10-CM | POA: Diagnosis present

## 2014-12-11 DIAGNOSIS — Z923 Personal history of irradiation: Secondary | ICD-10-CM | POA: Diagnosis not present

## 2014-12-11 DIAGNOSIS — Z96651 Presence of right artificial knee joint: Secondary | ICD-10-CM | POA: Diagnosis present

## 2014-12-11 DIAGNOSIS — H269 Unspecified cataract: Secondary | ICD-10-CM | POA: Diagnosis present

## 2014-12-11 DIAGNOSIS — I4891 Unspecified atrial fibrillation: Secondary | ICD-10-CM | POA: Diagnosis not present

## 2014-12-11 DIAGNOSIS — K317 Polyp of stomach and duodenum: Secondary | ICD-10-CM | POA: Diagnosis present

## 2014-12-11 DIAGNOSIS — I6523 Occlusion and stenosis of bilateral carotid arteries: Secondary | ICD-10-CM | POA: Diagnosis present

## 2014-12-11 DIAGNOSIS — R531 Weakness: Secondary | ICD-10-CM

## 2014-12-11 DIAGNOSIS — K921 Melena: Secondary | ICD-10-CM | POA: Diagnosis not present

## 2014-12-11 DIAGNOSIS — Z96652 Presence of left artificial knee joint: Secondary | ICD-10-CM | POA: Diagnosis present

## 2014-12-11 DIAGNOSIS — F0150 Vascular dementia without behavioral disturbance: Secondary | ICD-10-CM | POA: Diagnosis present

## 2014-12-11 DIAGNOSIS — D649 Anemia, unspecified: Secondary | ICD-10-CM | POA: Diagnosis present

## 2014-12-11 DIAGNOSIS — K2960 Other gastritis without bleeding: Secondary | ICD-10-CM | POA: Diagnosis present

## 2014-12-11 DIAGNOSIS — Z87891 Personal history of nicotine dependence: Secondary | ICD-10-CM | POA: Diagnosis not present

## 2014-12-11 DIAGNOSIS — D62 Acute posthemorrhagic anemia: Secondary | ICD-10-CM | POA: Diagnosis present

## 2014-12-11 DIAGNOSIS — Z981 Arthrodesis status: Secondary | ICD-10-CM

## 2014-12-11 DIAGNOSIS — Z7901 Long term (current) use of anticoagulants: Secondary | ICD-10-CM

## 2014-12-11 DIAGNOSIS — Z950 Presence of cardiac pacemaker: Secondary | ICD-10-CM | POA: Diagnosis present

## 2014-12-11 DIAGNOSIS — E785 Hyperlipidemia, unspecified: Secondary | ICD-10-CM | POA: Diagnosis present

## 2014-12-11 DIAGNOSIS — K219 Gastro-esophageal reflux disease without esophagitis: Secondary | ICD-10-CM | POA: Diagnosis present

## 2014-12-11 DIAGNOSIS — G4733 Obstructive sleep apnea (adult) (pediatric): Secondary | ICD-10-CM | POA: Diagnosis present

## 2014-12-11 DIAGNOSIS — I480 Paroxysmal atrial fibrillation: Secondary | ICD-10-CM | POA: Diagnosis present

## 2014-12-11 DIAGNOSIS — L4050 Arthropathic psoriasis, unspecified: Secondary | ICD-10-CM | POA: Diagnosis present

## 2014-12-11 DIAGNOSIS — Z8673 Personal history of transient ischemic attack (TIA), and cerebral infarction without residual deficits: Secondary | ICD-10-CM | POA: Diagnosis not present

## 2014-12-11 DIAGNOSIS — I10 Essential (primary) hypertension: Secondary | ICD-10-CM | POA: Diagnosis present

## 2014-12-11 DIAGNOSIS — I69919 Unspecified symptoms and signs involving cognitive functions following unspecified cerebrovascular disease: Secondary | ICD-10-CM

## 2014-12-11 DIAGNOSIS — I2510 Atherosclerotic heart disease of native coronary artery without angina pectoris: Secondary | ICD-10-CM | POA: Diagnosis present

## 2014-12-11 DIAGNOSIS — D72829 Elevated white blood cell count, unspecified: Secondary | ICD-10-CM

## 2014-12-11 DIAGNOSIS — Z79899 Other long term (current) drug therapy: Secondary | ICD-10-CM

## 2014-12-11 DIAGNOSIS — R9431 Abnormal electrocardiogram [ECG] [EKG]: Secondary | ICD-10-CM | POA: Diagnosis present

## 2014-12-11 DIAGNOSIS — I482 Chronic atrial fibrillation, unspecified: Secondary | ICD-10-CM | POA: Diagnosis present

## 2014-12-11 DIAGNOSIS — R9389 Abnormal findings on diagnostic imaging of other specified body structures: Secondary | ICD-10-CM | POA: Diagnosis present

## 2014-12-11 DIAGNOSIS — Z8546 Personal history of malignant neoplasm of prostate: Secondary | ICD-10-CM | POA: Diagnosis not present

## 2014-12-11 DIAGNOSIS — R509 Fever, unspecified: Secondary | ICD-10-CM

## 2014-12-11 DIAGNOSIS — R938 Abnormal findings on diagnostic imaging of other specified body structures: Secondary | ICD-10-CM | POA: Diagnosis not present

## 2014-12-11 LAB — URINALYSIS, ROUTINE W REFLEX MICROSCOPIC
Bilirubin Urine: NEGATIVE
GLUCOSE, UA: NEGATIVE mg/dL
KETONES UR: NEGATIVE mg/dL
Leukocytes, UA: NEGATIVE
Nitrite: NEGATIVE
Protein, ur: NEGATIVE mg/dL
SPECIFIC GRAVITY, URINE: 1.014 (ref 1.005–1.030)
Urobilinogen, UA: 1 mg/dL (ref 0.0–1.0)
pH: 7 (ref 5.0–8.0)

## 2014-12-11 LAB — I-STAT TROPONIN, ED: TROPONIN I, POC: 0.03 ng/mL (ref 0.00–0.08)

## 2014-12-11 LAB — BASIC METABOLIC PANEL
ANION GAP: 8 (ref 5–15)
BUN: 19 mg/dL (ref 6–20)
CHLORIDE: 107 mmol/L (ref 101–111)
CO2: 24 mmol/L (ref 22–32)
Calcium: 9.3 mg/dL (ref 8.9–10.3)
Creatinine, Ser: 1.19 mg/dL (ref 0.61–1.24)
GFR calc Af Amer: >60 mL/min (ref >60)
GFR calc non Af Amer: 56 mL/min — ABNORMAL LOW (ref >60)
Glucose, Bld: 131 mg/dL — ABNORMAL HIGH (ref 65–99)
Potassium: 4.3 mmol/L (ref 3.5–5.1)
Sodium: 139 mmol/L (ref 135–145)

## 2014-12-11 LAB — URINE MICROSCOPIC-ADD ON

## 2014-12-11 LAB — POC OCCULT BLOOD, ED: FECAL OCCULT BLD: POSITIVE — AB

## 2014-12-11 LAB — CBC
HEMATOCRIT: 31.2 % — AB (ref 39.0–52.0)
HEMOGLOBIN: 9.6 g/dL — AB (ref 13.0–17.0)
MCH: 24.7 pg — AB (ref 26.0–34.0)
MCHC: 30.8 g/dL (ref 30.0–36.0)
MCV: 80.4 fL (ref 78.0–100.0)
PLATELETS: 253 10*3/uL (ref 150–400)
RBC: 3.88 MIL/uL — ABNORMAL LOW (ref 4.22–5.81)
RDW: 17.3 % — ABNORMAL HIGH (ref 11.5–15.5)
WBC: 19.6 10*3/uL — AB (ref 4.0–10.5)

## 2014-12-11 LAB — TSH: TSH: 1.542 u[IU]/mL (ref 0.350–4.500)

## 2014-12-11 LAB — TROPONIN I: Troponin I: 0.04 ng/mL — ABNORMAL HIGH (ref <0.031)

## 2014-12-11 MED ORDER — ACETAMINOPHEN 325 MG PO TABS: 650.0000 mg | ORAL_TABLET | Freq: Four times a day (QID) | ORAL | Status: DC | PRN

## 2014-12-11 MED ORDER — ONDANSETRON HCL 4 MG PO TABS: 4.0000 mg | ORAL_TABLET | Freq: Four times a day (QID) | ORAL | Status: DC | PRN

## 2014-12-11 MED ORDER — ONDANSETRON HCL 4 MG/2ML IJ SOLN: 4.0000 mg | Freq: Four times a day (QID) | INTRAMUSCULAR | Status: DC | PRN

## 2014-12-11 MED ORDER — ROPINIROLE HCL 1 MG PO TABS
2.0000 mg | ORAL_TABLET | Freq: Two times a day (BID) | ORAL | Status: DC
  Administered 2014-12-11 – 2014-12-12 (×2): 2 mg via ORAL
  Filled 2014-12-11 (×5): qty 2

## 2014-12-11 MED ORDER — ONDANSETRON HCL 4 MG/2ML IJ SOLN: 4.0000 mg | Freq: Three times a day (TID) | INTRAMUSCULAR | Status: DC | PRN

## 2014-12-11 MED ORDER — ACETAMINOPHEN 650 MG RE SUPP: 650.0000 mg | Freq: Four times a day (QID) | RECTAL | Status: DC | PRN

## 2014-12-11 MED ORDER — PREDNISONE 5 MG PO TABS
7.5000 mg | ORAL_TABLET | Freq: Every day | ORAL | Status: DC
  Administered 2014-12-12 – 2014-12-13 (×2): 7.5 mg via ORAL
  Filled 2014-12-11 (×2): qty 1

## 2014-12-11 MED ORDER — PANTOPRAZOLE SODIUM 40 MG PO TBEC
40.0000 mg | DELAYED_RELEASE_TABLET | Freq: Two times a day (BID) | ORAL | Status: DC
  Administered 2014-12-11 – 2014-12-12 (×2): 40 mg via ORAL
  Filled 2014-12-11 (×2): qty 1

## 2014-12-11 MED ORDER — ATORVASTATIN CALCIUM 20 MG PO TABS
20.0000 mg | ORAL_TABLET | Freq: Every day | ORAL | Status: DC
Start: 2014-12-12 — End: 2014-12-13
  Administered 2014-12-12 – 2014-12-13 (×2): 20 mg via ORAL
  Filled 2014-12-11 (×2): qty 1

## 2014-12-11 MED ORDER — CITALOPRAM HYDROBROMIDE 20 MG PO TABS
30.0000 mg | ORAL_TABLET | Freq: Every day | ORAL | Status: DC
  Administered 2014-12-12 – 2014-12-13 (×2): 30 mg via ORAL
  Filled 2014-12-11 (×2): qty 1

## 2014-12-11 MED ORDER — EZETIMIBE 10 MG PO TABS
10.0000 mg | ORAL_TABLET | Freq: Every day | ORAL | Status: DC
Start: 2014-12-11 — End: 2014-12-13
  Administered 2014-12-11 – 2014-12-12 (×2): 10 mg via ORAL
  Filled 2014-12-11 (×3): qty 1

## 2014-12-11 MED ORDER — SODIUM CHLORIDE 0.9 % IV SOLN
INTRAVENOUS | Status: DC
  Administered 2014-12-11 – 2014-12-12 (×2): via INTRAVENOUS

## 2014-12-11 MED ORDER — IPRATROPIUM BROMIDE 0.06 % NA SOLN
2.0000 | Freq: Four times a day (QID) | NASAL | Status: DC
  Administered 2014-12-12 – 2014-12-13 (×3): 2 via NASAL
  Filled 2014-12-11: qty 15

## 2014-12-11 MED ORDER — MIRABEGRON ER 50 MG PO TB24
50.0000 mg | ORAL_TABLET | Freq: Every day | ORAL | Status: DC
  Administered 2014-12-11 – 2014-12-12 (×2): 50 mg via ORAL
  Filled 2014-12-11 (×3): qty 1

## 2014-12-11 NOTE — ED Notes (Signed)
Pt reports generalized weakness and fatigue, mild SOB over past few days. He denies any pain. hes also had some constipation and noticed his stools looked tarry.  He is A&O, breathing easily

## 2014-12-11 NOTE — ED Provider Notes (Signed)
CSN: [id number]     Arrival date & time 12/11/14  1246 History   First MD Initiated Contact with Patient 12/11/14 1325     Chief Complaint  Patient presents with  . Weakness     (Consider location/radiation/quality/duration/timing/severity/associated sxs/prior Treatment) HPI Comments: The patient is a 81 year old male, he has a history of stroke and transient ischemic attack, has been anticoagulated with Xarelto for over 1-1/2 years. He states that over the last 3 days he has developed generalized weakness with dark tarry stools, feels generally weak, there is no focal weakness, no sick B, no lightheadedness, no shortness of breath. The symptoms are persistent, nothing makes it better or worse, no associated fevers chills nausea vomiting swelling rashes headaches blurred vision chest pain or shortness of breath.  Patient is a 81 y.o. male presenting with weakness. The history is provided by the patient.  Weakness    Past Medical History  Diagnosis Date  . Hyperlipidemia   . HTN (hypertension)   . Status post placement of cardiac pacemaker DDD-  06-04-10-- LAST CHECK 09-08-10 IN EPIC    SECONDARY TO SYNCOPY AND BRADYCARDIA  . Restless leg syndrome   . Normal nuclear stress test 2008    LOW RISK--   DR  WALL  . Carotid stenosis, bilateral PER DR EARLY / DUPLEX  09-09-10      40 - 59%   BILATERALLY--- ASYMPTOMATIC  . History of echocardiogram 11-21-2006    EF 65%  . History of prostate cancer S/P RADIATION  [place]  6 YRS AGO  . Psoriasis   . Arthritis with psoriasis   . GERD (gastroesophageal reflux disease)     CONTROLLED W/ PROTONIX  . Borderline diabetes     DIET CONTROLLED - PT STATES HE IS NOT DIABETIC  . Lumbar spondylosis W/ RADICULOPATHY  . Iron deficiency   . Cataract immature LEFT EYE  . PAF (paroxysmal atrial fibrillation)   . SVT (supraventricular tachycardia)     S/P ABLATION   2008  . Coronary artery disease CARDIOLOGIST- DR Crissie Sickles    09-08-10 VISIT AND  PACEMAKER CHECK  IN EPIC  . Cancer     HX OF PROSTATE CANCER--TX'D WITH RADIATION  . Sleep apnea TESTED YRS AGO-- NO CPAP RX GIVEN    PT STATES HE DOES NOT THINK HE STILL HAS SLEEP APNEA--DOES NOT USE CPAP  . Stroke Oct. 14, 2014   Past Surgical History  Procedure Laterality Date  . Replacement total knee  1997    RIGHT  . Cardiac pacemaker placement  06-04-10    DDD/ AND REMOVAL LOOR RECORDER  . Loop recorder placement  01-30-10  . Lumbar laminectomy/ diskectomy/ fusion  05-19-10    L4 - 5  . Cholecystectomy  2009  . Cardiac electrophysiology study and ablation  2008    FOR SVT  . Prostate biopsy  2007  . Transurethral resection of prostate  2006  . Inguinal hernia repair  2005    LEFT/   DONE WITH PENILE PROSTESIS SURG.  . Removal and placement penile prosthesis implant   2005    AND LEFT CORPOROPLASTY /    DONE INGUINAL REPAIR  . Penile prosthesis implant  1995  . Knee arthroscopy  06/02/2011    Procedure: ARTHROSCOPY KNEE;  Surgeon: Gearlean Alf;  Location: [facility];  Service: Orthopedics;  Laterality: Left;  LEFT KNEE ARTHROSCOPY WITH DEBRIDEMENT  . Total knee arthroplasty  12/20/2011    Procedure: TOTAL KNEE ARTHROPLASTY;  Surgeon: Gearlean Alf, MD;  Location: [facility];  Service: Orthopedics;  Laterality: Left;  . Back surgery    . Joint replacement    . Spine surgery     Family History  Problem Relation Age of Onset  . Stroke Mother   . Early death Father    History  Substance Use Topics  . Smoking status: Former Smoker -- 0.10 packs/day for 55 years    Types: Cigarettes    Quit date: 12/07/1998  . Smokeless tobacco: Never Used  . Alcohol Use: Yes     Comment: RARE    Review of Systems  Neurological: Positive for weakness.  All other systems reviewed and are negative.     Allergies  Ciprofloxacin; Hydrocodone-acetaminophen; and Other  Home Medications   Prior to Admission medications   Medication Sig Start Date End Date Taking?  Authorizing Provider  atorvastatin (LIPITOR) 20 MG tablet Take 20 mg by mouth daily.   Yes Historical Provider, MD  citalopram (CELEXA) 20 MG tablet Take 1.5 tablets (30 mg total) by mouth daily. 05/14/14  Yes Kennyth Arnold, FNP  clotrimazole-betamethasone (LOTRISONE) cream Apply 1 application topically 2 (two) times daily as needed (psoriasis).   Yes Historical Provider, MD  cyanocobalamin (,VITAMIN B-12,) 1000 MCG/ML injection Inject 77mL twice a month Patient taking differently: Inject 33mL into the skin twice a month 08/01/14  Yes Kennyth Arnold, FNP  docusate sodium (COLACE) 100 MG capsule Take 100 mg by mouth. Every other day   Yes Historical Provider, MD  ezetimibe (ZETIA) 10 MG tablet Take 1 tablet (10 mg total) by mouth daily. 08/22/13  Yes Ricard Dillon, MD  furosemide (LASIX) 20 MG tablet Take 2 tablets (40 mg total) by mouth 2 (two) times daily. Patient taking differently: Take 40 mg by mouth daily.  10/15/14  Yes Evans Lance, MD  ipratropium (ATROVENT) 0.06 % nasal spray Place 2 sprays into both nostrils 4 (four) times daily. Patient taking differently: Place 2 sprays into both nostrils 4 (four) times daily as needed for rhinitis.  08/22/13  Yes Ricard Dillon, MD  iron polysaccharides (NIFEREX) 150 MG capsule Take 150 mg by mouth 2 (two) times daily.   Yes Historical Provider, MD  lisinopril (PRINIVIL,ZESTRIL) 10 MG tablet Take 1 tablet (10 mg total) by mouth daily. 06/20/14  Yes Belkys A Regalado, MD  methotrexate (RHEUMATREX) 2.5 MG tablet Take 4 tablets (10 mg total) by mouth once a week. Caution:Chemotherapy. Protect from light. Every monday 07/05/14  Yes Kennyth Arnold, FNP  mirabegron ER (MYRBETRIQ) 50 MG TB24 tablet Take 1 tablet (50 mg total) by mouth daily. 06/29/13  Yes Ricard Dillon, MD  pantoprazole (PROTONIX) 40 MG tablet Take 1 tablet (40 mg total) by mouth daily. 08/08/14  Yes Kennyth Arnold, FNP  predniSONE (DELTASONE) 5 MG tablet Take 1.5 tablets (7.5 mg total) by mouth  daily. 06/17/14  Yes Kennyth Arnold, FNP  rOPINIRole (REQUIP) 2 MG tablet TAKE 1 TABLET (2 MG TOTAL) BY MOUTH 2 (TWO) TIMES DAILY. 08/26/14  Yes Kennyth Arnold, FNP  SYRINGE/NEEDLE, DISP, 1 ML 25G X 5/8" 1 ML MISC Use to inject 1ml of B-12 every two weeks 07/25/14  Yes Kennyth Arnold, FNP  triamcinolone cream (KENALOG) 0.1 % Apply to affected area as directed Patient taking differently: Apply 1 application topically daily as needed (rash). Apply to affected area as directed 04/03/14  Yes Kennyth Arnold, FNP  XARELTO 20 MG TABS tablet TAKE  1 TABLET (20 MG TOTAL) BY MOUTH DAILY WITH SUPPER. THIS IS THE NEW MEDICATION FOR STROKE PREVENTION 09/02/14  Yes Kennyth Arnold, FNP   BP 131/99 mmHg  Pulse 76  Temp(Src) 99.1 F (37.3 C) (Oral)  Resp 21  Ht 6' 3.75" (1.924 m)  SpO2 100% Physical Exam  Constitutional: He appears well-developed and well-nourished. No distress.  HENT:  Head: Normocephalic and atraumatic.  Mouth/Throat: Oropharynx is clear and moist. No oropharyngeal exudate.  Eyes: Conjunctivae and EOM are normal. Pupils are equal, round, and reactive to light. Right eye exhibits no discharge. Left eye exhibits no discharge. No scleral icterus.  Neck: Normal range of motion. Neck supple. No JVD present. No thyromegaly present.  Cardiovascular: Normal rate, regular rhythm, normal heart sounds and intact distal pulses.  Exam reveals no gallop and no friction rub.   No murmur heard. Pulmonary/Chest: Effort normal and breath sounds normal. No respiratory distress. He has no wheezes. He has no rales.  Abdominal: Soft. Bowel sounds are normal. He exhibits no distension and no mass. There is no tenderness.  Musculoskeletal: Normal range of motion. He exhibits no edema or tenderness.  Lymphadenopathy:    He has no cervical adenopathy.  Neurological: He is alert. Coordination normal.  No focal weakness, normal strength in all 4 extremities, cranial nerves III through XII are normal, finger-nose-finger  is normal, speech is clear and goal-directed.  Skin: Skin is warm and dry. No rash noted. No erythema.  Psychiatric: He has a normal mood and affect. His behavior is normal.  Nursing note and vitals reviewed.   ED Course  Procedures (including critical care time) Labs Review Labs Reviewed  BASIC METABOLIC PANEL - Abnormal; Notable for the following:    Glucose, Bld 131 (*)    GFR calc non Af Amer 56 (*)    All other components within normal limits  CBC - Abnormal; Notable for the following:    WBC 19.6 (*)    RBC 3.88 (*)    Hemoglobin 9.6 (*)    HCT 31.2 (*)    MCH 24.7 (*)    RDW 17.3 (*)    All other components within normal limits  URINALYSIS, ROUTINE W REFLEX MICROSCOPIC (NOT AT [facility]) - Abnormal; Notable for the following:    Hgb urine dipstick TRACE (*)    All other components within normal limits  POC OCCULT BLOOD, ED - Abnormal; Notable for the following:    Fecal Occult Bld POSITIVE (*)    All other components within normal limits  URINE MICROSCOPIC-ADD ON  I-STAT TROPOININ, ED    Imaging Review No results found.   EKG Interpretation   Date/Time:  Wednesday December 11 2014 12:55:16 EDT Ventricular Rate:  91 PR Interval:  364 QRS Duration: 94 QT Interval:  354 QTC Calculation: 435 R Axis:   34 Text Interpretation:  Sinus rhythm with 1st degree A-V block ST \\T \ T wave  abnormality, consider lateral ischemia Abnormal ECG Since last tracing ST  \T\ T wave abnormalities are now seen. Confirmed by Sabra Heck  MD, [place]  [phone]) on 12/11/2014 1:52:00 PM      MDM   Final diagnoses:  Gastrointestinal hemorrhage, unspecified gastritis, unspecified gastrointestinal hemorrhage type  Weakness    Possible lower gastrointestinal bleed, hemoglobin slightly lower, otherwise labs are unremarkable except for a leukocytosis which is unexplained based on the patient's symptoms. Check Hemoccults, cardiac monitoring, EKG shows some nonspecific ST and T-wave abnormalities, cardiac  sources of generalized weakness could be possible.  Labs show just a leukocytosis, no obvious etiology, he is anemic, he does have gastritis or bleeding, generalized weakness may be related to cardiac findings including abnormal EKG, this was discussed with Dr. Conley Canal who will see the patient in consultation and admission. Holding orders written.    Noemi Chapel, MD 12/11/14 [phone]

## 2014-12-11 NOTE — ED Notes (Signed)
Attempted to start IV X1, able to obtain blood sample. Pt requesting hold off on IV until needed.

## 2014-12-11 NOTE — Consult Note (Signed)
Referring Provider:  Dr. Doree Barthel Primary Care Physician:  Haywood Pao, MD Primary Gastroenterologist:  Dr. Earlie Raveling  Reason for Consultation:  Heme positive stool and anemia  HPI: Christopher Baker is a 81 y.o. male admitted through the emergency room today because of severe weakness, in association with dark stools while on Xarelto for a history of A. fib and a previous CVA..   The patient has had a stroke and thus has some short-term memory impairment, so his history may not be entirely reliable, but he indicates he's had dark stools for a long time (on iron supplementation), and that his stools are formed, not tarry, in character. Moreover, his admission hemoglobin of 9.6 is essentially unchanged from a month earlier when it was 9.8, and his BUN was normal at 19 at the time of admission. On the other hand, he reports he was having multiple stools each day, despite a baseline history of constipation.  However, his stool was Hemoccult-positive in the ER.  The patient is not on any ulcerogenic medications as an outpatient. Moreover, he is on Protonix 40 mg daily.  The patient felt weak globally, which is what prompted admission. He had felt like that for several days. He did not pass out.  Although the patient has had several colonoscopies by his primary gastroenterologist, Dr. Earlie Raveling, from review of the record it does not actually appear he has ever had an upper endoscopy. His most recent colonoscopy was just a few months ago and was negative except for some sigmoid diverticulosis and internal hemorrhoids.  The patient does not have any localizing GI tract symptoms, such as anorexia, weight loss, dysphagia, reflux, abdominal pain, nausea, or significant change in bowel habits although he does have a tendency toward constipation and possibly somewhat more frequent stools recently than normal.  The patient believes he's been on Xarelto for about a year and a half, roughly from  the time of his CVA, by his report.   His hemoglobin in December 2015 was 11.1, and in January of this year was 12.4, so he is currently about 3 g below his baseline from 6 months ago. Iron level most recently was 46 which is in the low-normal range, while on iron supplementation.     Past Medical History  Diagnosis Date  . Hyperlipidemia   . HTN (hypertension)   . Status post placement of cardiac pacemaker DDD-  06-04-10-- LAST CHECK 09-08-10 IN EPIC    SECONDARY TO SYNCOPY AND BRADYCARDIA  . Restless leg syndrome   . Carotid stenosis, bilateral PER DR EARLY / DUPLEX  09-09-10      40 - 59%   BILATERALLY--- ASYMPTOMATIC  . History of prostate cancer S/P RADIATION  TX  6 YRS AGO  . Arthritis with psoriasis   . GERD (gastroesophageal reflux disease)     CONTROLLED W/ PROTONIX  . Borderline diabetes     DIET CONTROLLED - PT STATES HE IS NOT DIABETIC  . Lumbar spondylosis W/ RADICULOPATHY  . Iron deficiency   . Cataract immature LEFT EYE  . PAF (paroxysmal atrial fibrillation)   . SVT (supraventricular tachycardia)     S/P ABLATION   2008  . Sleep apnea TESTED YRS AGO-- NO CPAP RX GIVEN    PT STATES HE DOES NOT THINK HE STILL HAS SLEEP APNEA--DOES NOT USE CPAP  . Stroke Oct. 14, 2014    Past Surgical History  Procedure Laterality Date  . Replacement total knee  1997  RIGHT  . Cardiac pacemaker placement  06-04-10    DDD/ AND REMOVAL LOOR RECORDER  . Loop recorder placement  01-30-10  . Lumbar laminectomy/ diskectomy/ fusion  05-19-10    L4 - 5  . Cholecystectomy  2009  . Cardiac electrophysiology study and ablation  2008    FOR SVT  . Prostate biopsy  2007  . Transurethral resection of prostate  2006  . Inguinal hernia repair  2005    LEFT/   DONE WITH PENILE PROSTESIS SURG.  . Removal and placement penile prosthesis implant   2005    AND LEFT CORPOROPLASTY /    DONE INGUINAL REPAIR  . Penile prosthesis implant  1995  . Knee arthroscopy  06/02/2011    Procedure:  ARTHROSCOPY KNEE;  Surgeon: Gearlean Alf;  Location: [facility];  Service: Orthopedics;  Laterality: Left;  LEFT KNEE ARTHROSCOPY WITH DEBRIDEMENT  . Total knee arthroplasty  12/20/2011    Procedure: TOTAL KNEE ARTHROPLASTY;  Surgeon: Gearlean Alf, MD;  Location: [facility];  Service: Orthopedics;  Laterality: Left;  . Back surgery    . Joint replacement    . Spine surgery      Prior to Admission medications   Medication Sig Start Date End Date Taking? Authorizing Provider  atorvastatin (LIPITOR) 20 MG tablet Take 20 mg by mouth daily.   Yes Historical Provider, MD  citalopram (CELEXA) 20 MG tablet Take 1.5 tablets (30 mg total) by mouth daily. 05/14/14  Yes Kennyth Arnold, FNP  clotrimazole-betamethasone (LOTRISONE) cream Apply 1 application topically 2 (two) times daily as needed (psoriasis).   Yes Historical Provider, MD  cyanocobalamin (,VITAMIN B-12,) 1000 MCG/ML injection Inject 67mL twice a month Patient taking differently: Inject 27mL into the skin twice a month 08/01/14  Yes Kennyth Arnold, FNP  docusate sodium (COLACE) 100 MG capsule Take 100 mg by mouth. Every other day   Yes Historical Provider, MD  ezetimibe (ZETIA) 10 MG tablet Take 1 tablet (10 mg total) by mouth daily. 08/22/13  Yes Ricard Dillon, MD  furosemide (LASIX) 20 MG tablet Take 2 tablets (40 mg total) by mouth 2 (two) times daily. Patient taking differently: Take 40 mg by mouth daily.  10/15/14  Yes Evans Lance, MD  ipratropium (ATROVENT) 0.06 % nasal spray Place 2 sprays into both nostrils 4 (four) times daily. Patient taking differently: Place 2 sprays into both nostrils 4 (four) times daily as needed for rhinitis.  08/22/13  Yes Ricard Dillon, MD  iron polysaccharides (NIFEREX) 150 MG capsule Take 150 mg by mouth 2 (two) times daily.   Yes Historical Provider, MD  lisinopril (PRINIVIL,ZESTRIL) 10 MG tablet Take 1 tablet (10 mg total) by mouth daily. 06/20/14  Yes Belkys A Regalado, MD  methotrexate  (RHEUMATREX) 2.5 MG tablet Take 4 tablets (10 mg total) by mouth once a week. Caution:Chemotherapy. Protect from light. Every monday 07/05/14  Yes Kennyth Arnold, FNP  mirabegron ER (MYRBETRIQ) 50 MG TB24 tablet Take 1 tablet (50 mg total) by mouth daily. 06/29/13  Yes Ricard Dillon, MD  pantoprazole (PROTONIX) 40 MG tablet Take 1 tablet (40 mg total) by mouth daily. 08/08/14  Yes Kennyth Arnold, FNP  predniSONE (DELTASONE) 5 MG tablet Take 1.5 tablets (7.5 mg total) by mouth daily. 06/17/14  Yes Kennyth Arnold, FNP  rOPINIRole (REQUIP) 2 MG tablet TAKE 1 TABLET (2 MG TOTAL) BY MOUTH 2 (TWO) TIMES DAILY. 08/26/14  Yes Kennyth Arnold, FNP  SYRINGE/NEEDLE, DISP, 1 ML 25G X 5/8" 1 ML MISC Use to inject 66ml of B-12 every two weeks 07/25/14  Yes Kennyth Arnold, FNP  triamcinolone cream (KENALOG) 0.1 % Apply to affected area as directed Patient taking differently: Apply 1 application topically daily as needed (rash). Apply to affected area as directed 04/03/14  Yes Kennyth Arnold, FNP  XARELTO 20 MG TABS tablet TAKE 1 TABLET (20 MG TOTAL) BY MOUTH DAILY WITH SUPPER. THIS IS THE NEW MEDICATION FOR STROKE PREVENTION 09/02/14  Yes Kennyth Arnold, FNP    Current Facility-Administered Medications  Medication Dose Route Frequency Provider Last Rate Last Dose  . 0.9 %  sodium chloride infusion   Intravenous Continuous Delfina Redwood, MD 100 mL/hr at 12/11/14 2011    . acetaminophen (TYLENOL) tablet 650 mg  650 mg Oral Q6H PRN Delfina Redwood, MD       Or  . acetaminophen (TYLENOL) suppository 650 mg  650 mg Rectal Q6H PRN Delfina Redwood, MD      . Derrill Memo ON 12/12/2014] atorvastatin (LIPITOR) tablet 20 mg  20 mg Oral Daily Delfina Redwood, MD      . Derrill Memo ON 12/12/2014] citalopram (CELEXA) tablet 30 mg  30 mg Oral Daily Delfina Redwood, MD      . ezetimibe (ZETIA) tablet 10 mg  10 mg Oral Daily Delfina Redwood, MD   10 mg at 12/11/14 2144  . [START ON 12/12/2014] ipratropium (ATROVENT) 0.06 % nasal  spray 2 spray  2 spray Each Nare QID Delfina Redwood, MD      . mirabegron ER [facility]) tablet 50 mg  50 mg Oral Daily Delfina Redwood, MD   50 mg at 12/11/14 2144  . ondansetron (ZOFRAN) tablet 4 mg  4 mg Oral Q6H PRN Delfina Redwood, MD       Or  . ondansetron [facility]) injection 4 mg  4 mg Intravenous Q6H PRN Delfina Redwood, MD      . pantoprazole (PROTONIX) EC tablet 40 mg  40 mg Oral BID Delfina Redwood, MD   40 mg at 12/11/14 2144  . [START ON 12/12/2014] predniSONE (DELTASONE) tablet 7.5 mg  7.5 mg Oral Daily Delfina Redwood, MD      . rOPINIRole (REQUIP) tablet 2 mg  2 mg Oral BID Delfina Redwood, MD   2 mg at 12/11/14 1904    Allergies as of 12/11/2014 - Review Complete 12/11/2014  Allergen Reaction Noted  . Ciprofloxacin Nausea Only   . Hydrocodone-acetaminophen  03/21/2014  . Other Other (See Comments) 12/16/2011    Family History  Problem Relation Age of Onset  . Stroke Mother   . Early death Father     History   Social History  . Marital Status: Married    Spouse Name: N/A  . Number of Children: N/A  . Years of Education: N/A   Occupational History  . retired    Social History Main Topics  . Smoking status: Former Smoker -- 0.10 packs/day for 55 years    Types: Cigarettes    Quit date: 12/07/1998  . Smokeless tobacco: Never Used  . Alcohol Use: Yes     Comment: RARE  . Drug Use: No  . Sexual Activity:    Partners: Female   Other Topics Concern  . Not on file   Social History Narrative    Review of Systems: Please see history of present illness for GI review of systems.  As noted above, no issue with syncope, but rather, just generalized weakness over the past several days.  Physical Exam: Vital signs in last 24 hours: Temp:  [98.4 F (36.9 C)-99.1 F (37.3 C)] 98.4 F (36.9 C) (06/22 2019) Pulse Rate:  [62-93] 62 (06/22 2019) Resp:  [14-22] 18 (06/22 2019) BP: (119-145)/(44-99) 145/55 mmHg (06/22 2019) SpO2:  [96 %-100 %]  98 % (06/22 2019) Weight:  [97.3 kg (214 lb 8.1 oz)] 97.3 kg (214 lb 8.1 oz) (06/22 2019) Last BM Date: 12/11/14 General:   Alert,  Well-developed, well-nourished, pleasant and cooperative in NAD Head:  Normocephalic and atraumatic. Eyes:  Sclera clear, no icterus.   Conjunctiva pink. Mouth:   No ulcerations or lesions.  Oropharynx pink & moist. Neck:   No masses or thyromegaly. Lungs:  Clear throughout to auscultation.   No wheezes, crackles, or rhonchi. No evident respiratory distress. Heart:   Regular rate and rhythm; no murmurs, clicks, rubs,  or gallops. Abdomen:  Soft, nontender, mildly tympanitic, and nondistended. No masses, hepatosplenomegaly or ventral hernias noted. Normal bowel sounds, without bruits, guarding, or rebound.   Rectal:  Per ER physician, showed heme positive stool   Msk:   Symmetrical without gross deformities. Pulses:  Normal radial pulse is noted. Extremities:   Without clubbing, cyanosis, or edema. Neurologic:  Alert and coherent;  seems to have some short-term memory impairment as reported in the record, but no obvious focal neurologic deficit noted. Skin:  Intact without significant lesions or rashes. Cervical Nodes:  No significant cervical adenopathy. Psych:   Alert and cooperative. Normal mood and affect.  Intake/Output from previous day:   Intake/Output this shift: Total I/O In: 240 [P.O.:240] Out: -   Lab Results:  Recent Labs  12/11/14 1300  WBC 19.6*  HGB 9.6*  HCT 31.2*  PLT 253   BMET  Recent Labs  12/11/14 1300  NA 139  K 4.3  CL 107  CO2 24  GLUCOSE 131*  BUN 19  CREATININE 1.19  CALCIUM 9.3   LFT No results for input(s): PROT, ALBUMIN, AST, ALT, ALKPHOS, BILITOT, BILIDIR, IBILI in the last 72 hours. PT/INR No results for input(s): LABPROT, INR in the last 72 hours.  Studies/Results: X-ray Chest Pa And Lateral  12/11/2014   CLINICAL DATA:  Acute onset of low grade fever, leukocytosis and weakness. Initial encounter.   EXAM: CHEST  2 VIEW  COMPARISON:  Chest radiograph performed 06/18/2014  FINDINGS: The lungs are well-aerated. Pulmonary vascularity is at the upper limits of normal. A nonspecific 1.1 cm nodule is noted at the right upper lung zone. There is no evidence of pleural effusion or pneumothorax.  The heart is borderline normal in size. A pacemaker is noted at the left chest wall, with leads ending at the right atrium and right ventricle. No acute osseous abnormalities are seen.  IMPRESSION: 1. No acute cardiopulmonary process seen. 2. Nonspecific 1.1 cm nodule at the right upper lung zone. CT of the chest would be helpful for further evaluation, on an elective nonemergent basis.   Electronically Signed   By: Garald Balding M.D.   On: 12/11/2014 21:37    Impression: 1. Heme positive stool 2. Anemia, not clearly due to iron deficiency, stable over the past month, but hemoglobin down from 6 months ago 3. Dark stools, possibly related to iron 4. Chronic anticoagulation; on PPI therapy 5. History of SVT, history of bradycardia status post pacemaker, history of embolic CVA (per outside records and patient) with short-term  memory loss 6. Past history of adenomatous colon polyps with recently negative colonoscopy (sigmoid diverticulosis present)  Plan: Although it is not clear that this patient is having active GI blood loss (BUN normal, hemoglobin stable, dark stools potentially accounted for by iron supplementation; however, he is Hemoccult-positive), I nonetheless feel that endoscopic evaluation is appropriate in view of his heme positivity, his chronic anemia, and his need for ongoing anticoagulation. I have reviewed the nature, purpose, and risks of upper endoscopy with the patient, who seems to understand and is agreeable to proceeding. Colonoscopy not needed in view of the fact it was performed just 3 months ago. Consideration could be given to capsule endoscopy evaluation of the small bowel if the upper  endoscopy is unrevealing.   LOS: 0 days   Donnel Venuto V  12/11/2014, 10:14 PM   Pager [phone] If no answer or after 5 PM call [phone]

## 2014-12-11 NOTE — H&P (Signed)
[facility] History and Physical  Christopher Baker JJO:[id number] DOB: 12/06/1933 DOA: 12/11/2014  Referring physician: Sabra Heck PCP: Haywood Pao, MD   Chief Complaint: weakness and black tarry stools  HPI: Christopher Baker is a 81 y.o. male  With h/o embolic stroke, PAF, pacemaker, on xarelto, HTN, GERD, psoriatic arthritis, HLD came to ED with complaints of severe weakness, generalized. Has had black tarry stools x3 days. No n/v. Reports fever of 101 this am. No cough. Had recent colonoscopy by Dr. Earlean Shawl which showed diverticula, and EGD in 2011.  Denies abdominal pain.  Has orthostatic dizziness.  In ED, afebrile with normal vital signs. BUN normal. hgb 9.5. UA negative for infection. WBC 19K.  ST with new nonspecific lateral ST depression, but patient denies chest pain.  Has h/o memory problems after previous CVA, so much of history is per chart review and wife.  Stool heme positive by Ed[facility]   Review of Systems:  Complete systems reviewed. As above, otherwise negative.   Past Medical History  Diagnosis Date  . Hyperlipidemia   . HTN (hypertension)   . Status post placement of cardiac pacemaker DDD-  06-04-10-- LAST CHECK 09-08-10 IN EPIC    SECONDARY TO SYNCOPY AND BRADYCARDIA  . Restless leg syndrome   . Normal nuclear stress test 2008    LOW RISK--   DR  WALL  . Carotid stenosis, bilateral PER DR EARLY / DUPLEX  09-09-10      40 - 59%   BILATERALLY--- ASYMPTOMATIC  . History of echocardiogram 11-21-2006    EF 65%  . History of prostate cancer S/P RADIATION  TX  6 YRS AGO  . Psoriasis   . Arthritis with psoriasis   . GERD (gastroesophageal reflux disease)     CONTROLLED W/ PROTONIX  . Borderline diabetes     DIET CONTROLLED - PT STATES HE IS NOT DIABETIC  . Lumbar spondylosis W/ RADICULOPATHY  . Iron deficiency   . Cataract immature LEFT EYE  . PAF (paroxysmal atrial fibrillation)   . SVT (supraventricular tachycardia)     S/P ABLATION   2008  . Coronary artery  disease CARDIOLOGIST- DR Crissie Sickles    09-08-10 VISIT AND PACEMAKER CHECK  IN EPIC  . Cancer     HX OF PROSTATE CANCER--TX'D WITH RADIATION  . Sleep apnea TESTED YRS AGO-- NO CPAP RX GIVEN    PT STATES HE DOES NOT THINK HE STILL HAS SLEEP APNEA--DOES NOT USE CPAP  . Stroke Oct. 14, 2014   Past Surgical History  Procedure Laterality Date  . Replacement total knee  1997    RIGHT  . Cardiac pacemaker placement  06-04-10    DDD/ AND REMOVAL LOOR RECORDER  . Loop recorder placement  01-30-10  . Lumbar laminectomy/ diskectomy/ fusion  05-19-10    L4 - 5  . Cholecystectomy  2009  . Cardiac electrophysiology study and ablation  2008    FOR SVT  . Prostate biopsy  2007  . Transurethral resection of prostate  2006  . Inguinal hernia repair  2005    LEFT/   DONE WITH PENILE PROSTESIS SURG.  . Removal and placement penile prosthesis implant   2005    AND LEFT CORPOROPLASTY /    DONE INGUINAL REPAIR  . Penile prosthesis implant  1995  . Knee arthroscopy  06/02/2011    Procedure: ARTHROSCOPY KNEE;  Surgeon: Gearlean Alf;  Location: [place];  Service: Orthopedics;  Laterality: Left;  LEFT KNEE ARTHROSCOPY  WITH DEBRIDEMENT  . Total knee arthroplasty  12/20/2011    Procedure: TOTAL KNEE ARTHROPLASTY;  Surgeon: Gearlean Alf, MD;  Location: [facility];  Service: Orthopedics;  Laterality: Left;  . Back surgery    . Joint replacement    . Spine surgery     Social History:  reports that he quit smoking about 16 years ago. His smoking use included Cigarettes. He has a 5.5 pack-year smoking history. He has never used smokeless tobacco. He reports that he drinks alcohol. He reports that he does not use illicit drugs.  Allergies  Allergen Reactions  . Ciprofloxacin Nausea Only    REACTION: GI upset  . Hydrocodone-Acetaminophen     Hallucinations in higher doses  . Other Other (See Comments)    SCALLOPS--SUDDEN GI ATTACK    Family History  Problem Relation Age of Onset  .  Stroke Mother   . Early death Father     Prior to Admission medications   Medication Sig Start Date End Date Taking? Authorizing Provider  atorvastatin (LIPITOR) 20 MG tablet Take 20 mg by mouth daily.   Yes Historical Provider, MD  citalopram (CELEXA) 20 MG tablet Take 1.5 tablets (30 mg total) by mouth daily. 05/14/14  Yes Kennyth Arnold, FNP  clotrimazole-betamethasone (LOTRISONE) cream Apply 1 application topically 2 (two) times daily as needed (psoriasis).   Yes Historical Provider, MD  cyanocobalamin (,VITAMIN B-12,) 1000 MCG/ML injection Inject 19mL twice a month Patient taking differently: Inject 28mL into the skin twice a month 08/01/14  Yes Kennyth Arnold, FNP  docusate sodium (COLACE) 100 MG capsule Take 100 mg by mouth. Every other day   Yes Historical Provider, MD  ezetimibe (ZETIA) 10 MG tablet Take 1 tablet (10 mg total) by mouth daily. 08/22/13  Yes Ricard Dillon, MD  furosemide (LASIX) 20 MG tablet Take 2 tablets (40 mg total) by mouth 2 (two) times daily. Patient taking differently: Take 40 mg by mouth daily.  10/15/14  Yes Evans Lance, MD  ipratropium (ATROVENT) 0.06 % nasal spray Place 2 sprays into both nostrils 4 (four) times daily. Patient taking differently: Place 2 sprays into both nostrils 4 (four) times daily as needed for rhinitis.  08/22/13  Yes Ricard Dillon, MD  iron polysaccharides (NIFEREX) 150 MG capsule Take 150 mg by mouth 2 (two) times daily.   Yes Historical Provider, MD  lisinopril (PRINIVIL,ZESTRIL) 10 MG tablet Take 1 tablet (10 mg total) by mouth daily. 06/20/14  Yes Belkys A Regalado, MD  methotrexate (RHEUMATREX) 2.5 MG tablet Take 4 tablets (10 mg total) by mouth once a week. Caution:Chemotherapy. Protect from light. Every monday 07/05/14  Yes Kennyth Arnold, FNP  mirabegron ER (MYRBETRIQ) 50 MG TB24 tablet Take 1 tablet (50 mg total) by mouth daily. 06/29/13  Yes Ricard Dillon, MD  pantoprazole (PROTONIX) 40 MG tablet Take 1 tablet (40 mg total) by mouth  daily. 08/08/14  Yes Kennyth Arnold, FNP  predniSONE (DELTASONE) 5 MG tablet Take 1.5 tablets (7.5 mg total) by mouth daily. 06/17/14  Yes Kennyth Arnold, FNP  rOPINIRole (REQUIP) 2 MG tablet TAKE 1 TABLET (2 MG TOTAL) BY MOUTH 2 (TWO) TIMES DAILY. 08/26/14  Yes Kennyth Arnold, FNP  SYRINGE/NEEDLE, DISP, 1 ML 25G X 5/8" 1 ML MISC Use to inject 43ml of B-12 every two weeks 07/25/14  Yes Kennyth Arnold, FNP  triamcinolone cream (KENALOG) 0.1 % Apply to affected area as directed Patient taking differently: Apply 1  application topically daily as needed (rash). Apply to affected area as directed 04/03/14  Yes Kennyth Arnold, FNP  XARELTO 20 MG TABS tablet TAKE 1 TABLET (20 MG TOTAL) BY MOUTH DAILY WITH SUPPER. THIS IS THE NEW MEDICATION FOR STROKE PREVENTION 09/02/14  Yes Kennyth Arnold, FNP   Physical Exam: Filed Vitals:   12/11/14 1730 12/11/14 1745 12/11/14 1800 12/11/14 1815  BP: 137/50 142/63 136/62 137/59  Pulse: 72 65 73 72  Temp:      TempSrc:      Resp: 20 16 19 19   Height:      SpO2: 98% 98% 98% 99%    Wt Readings from Last 3 Encounters:  11/14/14 100.245 kg (221 lb)  10/15/14 100.517 kg (221 lb 9.6 oz)  10/11/14 102.513 kg (226 lb)  BP 145/55 mmHg  Pulse 62  Temp(Src) 98.4 F (36.9 C) (Oral)  Resp 18  Ht 6\' 3"  (1.905 m)  Wt 97.3 kg (214 lb 8.1 oz)  BMI 26.81 kg/m2  SpO2 98%  General Appearance:    Alert, cooperative, no distress, appears stated age  Head:    Normocephalic, without obvious abnormality, atraumatic  Eyes:    PERRL, conjunctiva/corneas clear, EOM's intact,  Nose:   Nares normal, septum midline, mucosa normal, no drainage    or sinus tenderness  Throat:   Lips, mucosa, and tongue normal; teeth and gums normal  Neck:   Supple, symmetrical, trachea midline, no adenopathy;    thyroid:  no enlargement/tenderness/nodules; no carotid   bruit or JVD  Back:     Symmetric, no curvature, ROM normal, no CVA tenderness  Lungs:     Clear to auscultation bilaterally,  respirations unlabored  Chest Wall:    No tenderness or deformity   Heart:    Regular rate and rhythm, S1 and S2 normal, no murmur, rub   or gallop  Abdomen:     Soft, non-tender, bowel sounds active all four quadrants,    no masses, no organomegaly  Genitalia:    deferred  Rectal:    Per EDP dark heme positive stool   guaiac negative stool  Extremities:   Extremities normal, atraumatic, no cyanosis or edema  Pulses:   2+ and symmetric all extremities  Skin:   Skin color, texture, turgor normal, no rashes or lesions  Lymph nodes:   Cervical, supraclavicular, and axillary nodes normal  Neurologic:   CNII-XII intact, normal strength, sensation and reflexes    throughout    Psych: normal affect.        Labs on Admission:  Basic Metabolic Panel:  Recent Labs Lab 12/11/14 1300  NA 139  K 4.3  CL 107  CO2 24  GLUCOSE 131*  BUN 19  CREATININE 1.19  CALCIUM 9.3   Liver Function Tests: No results for input(s): AST, ALT, ALKPHOS, BILITOT, PROT, ALBUMIN in the last 168 hours. No results for input(s): LIPASE, AMYLASE in the last 168 hours. No results for input(s): AMMONIA in the last 168 hours. CBC:  Recent Labs Lab 12/11/14 1300  WBC 19.6*  HGB 9.6*  HCT 31.2*  MCV 80.4  PLT 253   Cardiac Enzymes: No results for input(s): CKTOTAL, CKMB, CKMBINDEX, TROPONINI in the last 168 hours.  BNP (last 3 results) No results for input(s): BNP in the last 8760 hours.  ProBNP (last 3 results) No results for input(s): PROBNP in the last 8760 hours.  CBG: No results for input(s): GLUCAP in the last 168 hours.  Radiological Exams on  Admission: No results found.  EKG: tracing reviewed.  NSR with new lateral ischemic changes.  Assessment/Plan Principal Problem:   GI bleed on xarelto: likely upper.  HD stable and hgb > 9. Bid PPI. Consulted [facility].  Will need EGD. IVF, serial h/h. NPO after midnight. Hold xarelto  Active Problems:   Anemia: acute on chronic secondary to acute  blood loss.    Leukocytosis: with reported fever. None now. ua neg. Will get CXR and monitor for signs of infection.    Abnormal EKG: no CP. Serial enzymes and repeat EKG in am    Hyperlipidemia   Essential hypertension   FIBRILLATION, ATRIAL   GERD   PSORIATIC ARTHRITIS   PROSTATE CANCER, HX OF   Pacemaker-Medtronic   Embolic cerebral infarction   Cognitive deficits as late effect of cerebrovascular disease   OSA (obstructive sleep apnea) on cpap   Weakness generalized: PT eval secondary to bleeding   Restless leg syndrome: continue meds   Code Status: stable Family Communication: wife Disposition Plan: tele. Likely 3 day admission  Time spent: 60 min  [facility]

## 2014-12-12 ENCOUNTER — Inpatient Hospital Stay (HOSPITAL_COMMUNITY): Admission: RE | Admit: 2014-12-12 | Payer: PPO | Source: Ambulatory Visit

## 2014-12-12 ENCOUNTER — Ambulatory Visit: Payer: Commercial Managed Care - HMO | Admitting: Family

## 2014-12-12 ENCOUNTER — Encounter (HOSPITAL_COMMUNITY): Payer: Self-pay | Admitting: *Deleted

## 2014-12-12 ENCOUNTER — Encounter (HOSPITAL_COMMUNITY)
Admission: EM | Disposition: A | Discharge status: Home / Self Care | Payer: Self-pay | Source: Home / Self Care | Attending: Internal Medicine

## 2014-12-12 ENCOUNTER — Inpatient Hospital Stay (HOSPITAL_COMMUNITY): Payer: PPO

## 2014-12-12 DIAGNOSIS — R9389 Abnormal findings on diagnostic imaging of other specified body structures: Secondary | ICD-10-CM | POA: Diagnosis present

## 2014-12-12 DIAGNOSIS — R938 Abnormal findings on diagnostic imaging of other specified body structures: Secondary | ICD-10-CM

## 2014-12-12 HISTORY — PX: ESOPHAGOGASTRODUODENOSCOPY: SHX5428

## 2014-12-12 LAB — TROPONIN I
TROPONIN I: 0.03 ng/mL (ref <0.031)
Troponin I: 0.03 ng/mL (ref <0.031)

## 2014-12-12 LAB — CBC WITH DIFFERENTIAL/PLATELET
BASOS PCT: 0 % (ref 0–1)
Basophils Absolute: 0 10*3/uL (ref 0.0–0.1)
EOS ABS: 0 10*3/uL (ref 0.0–0.7)
Eosinophils Relative: 0 % (ref 0–5)
HCT: 29.1 % — ABNORMAL LOW (ref 39.0–52.0)
Hemoglobin: 8.9 g/dL — ABNORMAL LOW (ref 13.0–17.0)
LYMPHS ABS: 0.6 10*3/uL — AB (ref 0.7–4.0)
Lymphocytes Relative: 5 % — ABNORMAL LOW (ref 12–46)
MCH: 24.9 pg — AB (ref 26.0–34.0)
MCHC: 30.6 g/dL (ref 30.0–36.0)
MCV: 81.5 fL (ref 78.0–100.0)
Monocytes Absolute: 1.2 10*3/uL — ABNORMAL HIGH (ref 0.1–1.0)
Monocytes Relative: 10 % (ref 3–12)
Neutro Abs: 10.2 10*3/uL — ABNORMAL HIGH (ref 1.7–7.7)
Neutrophils Relative %: 85 % — ABNORMAL HIGH (ref 43–77)
PLATELETS: 200 10*3/uL (ref 150–400)
RBC: 3.57 MIL/uL — AB (ref 4.22–5.81)
RDW: 17.2 % — ABNORMAL HIGH (ref 11.5–15.5)
WBC: 12.1 10*3/uL — AB (ref 4.0–10.5)

## 2014-12-12 LAB — TYPE AND SCREEN
ABO/RH(D): A POS
ANTIBODY SCREEN: NEGATIVE

## 2014-12-12 SURGERY — EGD (ESOPHAGOGASTRODUODENOSCOPY)
Anesthesia: Moderate Sedation

## 2014-12-12 MED ORDER — MIDAZOLAM HCL 10 MG/2ML IJ SOLN
INTRAMUSCULAR | Status: DC | PRN
  Administered 2014-12-12 (×2): 2 mg via INTRAVENOUS
  Administered 2014-12-12: 1 mg via INTRAVENOUS

## 2014-12-12 MED ORDER — SODIUM CHLORIDE 0.9 % IJ SOLN
PREFILLED_SYRINGE | INTRAMUSCULAR | Status: DC | PRN
  Administered 2014-12-12: 3 mL

## 2014-12-12 MED ORDER — PANTOPRAZOLE SODIUM 40 MG IV SOLR
40.0000 mg | INTRAVENOUS | Status: DC
  Administered 2014-12-12: 40 mg via INTRAVENOUS
  Filled 2014-12-12 (×2): qty 40

## 2014-12-12 MED ORDER — FENTANYL CITRATE (PF) 100 MCG/2ML IJ SOLN
INTRAMUSCULAR | Status: DC | PRN
  Administered 2014-12-12 (×4): 25 ug via INTRAVENOUS

## 2014-12-12 MED ORDER — EPINEPHRINE HCL 0.1 MG/ML IJ SOSY
PREFILLED_SYRINGE | INTRAMUSCULAR | Status: AC
  Filled 2014-12-12: qty 10

## 2014-12-12 MED ORDER — SODIUM CHLORIDE 0.9 % IV SOLN
INTRAVENOUS | Status: DC
Start: 2014-12-12 — End: 2014-12-12

## 2014-12-12 MED ORDER — MIDAZOLAM HCL 5 MG/ML IJ SOLN
INTRAMUSCULAR | Status: AC
  Filled 2014-12-12: qty 2

## 2014-12-12 MED ORDER — FENTANYL CITRATE (PF) 100 MCG/2ML IJ SOLN
INTRAMUSCULAR | Status: AC
  Filled 2014-12-12: qty 2

## 2014-12-12 MED ORDER — DIPHENHYDRAMINE HCL 50 MG/ML IJ SOLN
INTRAMUSCULAR | Status: AC
  Filled 2014-12-12: qty 1

## 2014-12-12 NOTE — Progress Notes (Signed)
RT spoke with patient about wearing CPAP and the patient stated that he did not want to wear CPAP tonight.  RT will continue to monitor.

## 2014-12-12 NOTE — Op Note (Signed)
[facility] [place], [address]   ENDOSCOPY PROCEDURE REPORT  PATIENT: Christopher Baker, Christopher Baker  MR#: [id number] BIRTHDATE: March 29, 1934 , 81  yrs. old GENDER: male ENDOSCOPIST: Wilford Corner, MD REFERRED BY: PROCEDURE DATE:  2014/12/21 PROCEDURE:  EGD w/ biopsy ASA CLASS:     Class III INDICATIONS:  hemoccult positive stool and anemia. MEDICATIONS: Fentanyl 100 mcg IV and Versed 5 mg IV TOPICAL ANESTHETIC: Cetacaine Spray  DESCRIPTION OF PROCEDURE: After the risks benefits and alternatives of the procedure were thoroughly explained, informed consent was obtained.  The Pentax Gastroscope [id number] endoscope was introduced through the mouth and advanced to the second portion of the duodenum , Without limitations.  The instrument was slowly withdrawn as the mucosa was fully examined. Estimated blood loss is zero unless otherwise noted in this procedure report.   Esophagus and GEJ (42 cm from the incisors) normal in appearance. Mild patchy erythema in antrum consistent with mild antral gastritis. Small amount of clear bile in stomach. Duodenal bulb and 2nd portion of the duodenum normal in appearance. On retroflexion a 1.5 cm semi-pedunculated polypoid lesion noted without active bleeding in the cardia of the stomach. Biopsies were taken and due to post-biopsy bleeding 3 cc of diluted epinephrine (1:10,000U) mixture was injected into the base of the lesion with good blanching and hemostasis noted.             The scope was then withdrawn from the patient and the procedure completed.  COMPLICATIONS: There were no immediate complications.  ENDOSCOPIC IMPRESSION:     Medium-sized polypoid lesion (no active bleeding prior to biopsies) - s/p biopsies and epi injection Antral gastritis  RECOMMENDATIONS:     F/U on path; Continue to hold Xarelto; Supportive care   eSigned:  Wilford Corner, MD 12/21/2014 3:42 PM    CC:  CPT CODES: ICD  CODES:  The ICD and CPT codes recommended by this software are interpretations from the data that the clinical staff has captured with the software.  The verification of the translation of this report to the ICD and CPT codes and modifiers is the sole responsibility of the health care institution and practicing physician where this report was generated.  [employer] will not be held responsible for the validity of the ICD and CPT codes included on this report.  AMA assumes no liability for data contained or not contained herein. CPT is a Designer, television/film set of the [employer].  PATIENT NAME:  Christopher Baker, Christopher Baker MR#: [id number]

## 2014-12-12 NOTE — Progress Notes (Signed)
Spoke with patient about trying to wear his CPAP tonight and he refused. Instructed him to inform us if he changes his mind at any time. RT will continue to monitor.

## 2014-12-12 NOTE — Progress Notes (Signed)
[facility] PROGRESS NOTE  QUIRINO KAKOS KPT:[id number] DOB: Jun 11, 1934 DOA: 12/11/2014 PCP: Haywood Pao, MD  Assessment/Plan:  Principal Problem:   GI bleed: s/p polypectomy: monitor h/h. 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 No further fevers. CXR without infiltrate   Restless leg syndrome   Anemia   Abnormal EKG CXR with nodule will get CT chest  Code Status:  full Family Communication:   Disposition Plan:  home  Consultants:    Procedures:     Antibiotics:    HPI/Subjective:   Objective: Filed Vitals:   12/12/14 1550  BP: 199/62  Pulse: 62  Temp:   Resp: 22    Intake/Output Summary (Last 24 hours) at 12/12/14 1551 Last data filed at 12/12/14 1239  Gross per 24 hour  Intake 1206.67 ml  Output   1125 ml  Net  81.67 ml   Filed Weights   12/11/14 2019  Weight: 97.3 kg (214 lb 8.1 oz)    Exam:   General:  Sleepy after procedure  Cardiovascular: RRR without MGR  Respiratory: CTA without WRR  Abdomen: S, NT, ND  Ext:  No CCE  Basic Metabolic Panel:  Recent Labs Lab 12/11/14 1300  NA 139  K 4.3  CL 107  CO2 24  GLUCOSE 131*  BUN 19  CREATININE 1.19  CALCIUM 9.3   Liver Function Tests: No results for input(s): AST, ALT, ALKPHOS, BILITOT, PROT, ALBUMIN in the last 168 hours. No results for input(s): LIPASE, AMYLASE in the last 168 hours. No results for input(s): AMMONIA in the last 168 hours. CBC:  Recent Labs Lab 12/11/14 1300 12/12/14 0652  WBC 19.6* 12.1*  NEUTROABS  --  10.2*  HGB 9.6* 8.9*  HCT 31.2* 29.1*  MCV 80.4 81.5  PLT 253 200   Cardiac Enzymes:  Recent Labs Lab 12/11/14 1842  12/12/14 0035 12/12/14 0652  TROPONINI 0.04* 0.03 0.03   BNP (last 3 results) No results for input(s): BNP in the last 8760 hours.  ProBNP (last 3 results) No results for input(s): PROBNP in the last 8760 hours.  CBG: No results for input(s): GLUCAP in the last 168 hours.  No results found for this or any previous visit (from the past 240 hour(s)).   Studies: X-ray Chest Pa And Lateral  12/11/2014   CLINICAL DATA:  Acute onset of low grade fever, leukocytosis and weakness. Initial encounter.  EXAM: CHEST  2 VIEW  COMPARISON:  Chest radiograph performed 06/18/2014  FINDINGS: The lungs are well-aerated. Pulmonary vascularity is at the upper limits of normal. A nonspecific 1.1 cm nodule is noted at the right upper lung zone. There is no evidence of pleural effusion or pneumothorax.  The heart is borderline normal in size. A pacemaker is noted at the left chest wall, with leads ending at the right atrium and right ventricle. No acute osseous abnormalities are seen.  IMPRESSION: 1. No acute cardiopulmonary process seen. 2. Nonspecific 1.1 cm nodule at the right upper lung zone. CT of the chest would be helpful for further evaluation, on an elective nonemergent basis.   Electronically Signed   By: Garald Balding M.D.   On: 12/11/2014 21:37    Scheduled  Meds: . [MAR Hold] atorvastatin  20 mg Oral Daily  . [MAR Hold] citalopram  30 mg Oral Daily  . [MAR Hold] ezetimibe  10 mg Oral Daily  . [MAR Hold] ipratropium  2 spray Each Nare QID  . [MAR Hold] mirabegron ER  50 mg Oral Daily  . [MAR Hold] pantoprazole  40 mg Oral BID  . [MAR Hold] predniSONE  7.5 mg Oral Daily  . [MAR Hold] rOPINIRole  2 mg Oral BID   Continuous Infusions: . sodium chloride 100 mL/hr at 12/12/14 0551  . sodium chloride      Time spent: 35 minutes  [facility] Pager [phone]. If 7PM-7AM, please contact night-coverage at [email or web address], password [facility] 12/12/2014, 3:51 PM  LOS: 1 day

## 2014-12-12 NOTE — H&P (View-Only) (Signed)
Referring Provider:  Dr. Doree Barthel Primary Care Physician:  Haywood Pao, MD Primary Gastroenterologist:  Dr. Earlie Raveling  Reason for Consultation:  Heme positive stool and anemia  HPI: Christopher Baker is a 81 y.o. male admitted through the emergency room today because of severe weakness, in association with dark stools while on Xarelto for a history of A. fib and a previous CVA..   The patient has had a stroke and thus has some short-term memory impairment, so his history may not be entirely reliable, but he indicates he's had dark stools for a long time (on iron supplementation), and that his stools are formed, not tarry, in character. Moreover, his admission hemoglobin of 9.6 is essentially unchanged from a month earlier when it was 9.8, and his BUN was normal at 19 at the time of admission. On the other hand, he reports he was having multiple stools each day, despite a baseline history of constipation.  However, his stool was Hemoccult-positive in the ER.  The patient is not on any ulcerogenic medications as an outpatient. Moreover, he is on Protonix 40 mg daily.  The patient felt weak globally, which is what prompted admission. He had felt like that for several days. He did not pass out.  Although the patient has had several colonoscopies by his primary gastroenterologist, Dr. Earlie Raveling, from review of the record it does not actually appear he has ever had an upper endoscopy. His most recent colonoscopy was just a few months ago and was negative except for some sigmoid diverticulosis and internal hemorrhoids.  The patient does not have any localizing GI tract symptoms, such as anorexia, weight loss, dysphagia, reflux, abdominal pain, nausea, or significant change in bowel habits although he does have a tendency toward constipation and possibly somewhat more frequent stools recently than normal.  The patient believes he's been on Xarelto for about a year and a half, roughly from  the time of his CVA, by his report.   His hemoglobin in December 2015 was 11.1, and in January of this year was 12.4, so he is currently about 3 g below his baseline from 6 months ago. Iron level most recently was 46 which is in the low-normal range, while on iron supplementation.     Past Medical History  Diagnosis Date  . Hyperlipidemia   . HTN (hypertension)   . Status post placement of cardiac pacemaker DDD-  06-04-10-- LAST CHECK 09-08-10 IN EPIC    SECONDARY TO SYNCOPY AND BRADYCARDIA  . Restless leg syndrome   . Carotid stenosis, bilateral PER DR EARLY / DUPLEX  09-09-10      40 - 59%   BILATERALLY--- ASYMPTOMATIC  . History of prostate cancer S/P RADIATION  TX  6 YRS AGO  . Arthritis with psoriasis   . GERD (gastroesophageal reflux disease)     CONTROLLED W/ PROTONIX  . Borderline diabetes     DIET CONTROLLED - PT STATES HE IS NOT DIABETIC  . Lumbar spondylosis W/ RADICULOPATHY  . Iron deficiency   . Cataract immature LEFT EYE  . PAF (paroxysmal atrial fibrillation)   . SVT (supraventricular tachycardia)     S/P ABLATION   2008  . Sleep apnea TESTED YRS AGO-- NO CPAP RX GIVEN    PT STATES HE DOES NOT THINK HE STILL HAS SLEEP APNEA--DOES NOT USE CPAP  . Stroke Oct. 14, 2014    Past Surgical History  Procedure Laterality Date  . Replacement total knee  1997  RIGHT  . Cardiac pacemaker placement  06-04-10    DDD/ AND REMOVAL LOOR RECORDER  . Loop recorder placement  01-30-10  . Lumbar laminectomy/ diskectomy/ fusion  05-19-10    L4 - 5  . Cholecystectomy  2009  . Cardiac electrophysiology study and ablation  2008    FOR SVT  . Prostate biopsy  2007  . Transurethral resection of prostate  2006  . Inguinal hernia repair  2005    LEFT/   DONE WITH PENILE PROSTESIS SURG.  . Removal and placement penile prosthesis implant   2005    AND LEFT CORPOROPLASTY /    DONE INGUINAL REPAIR  . Penile prosthesis implant  1995  . Knee arthroscopy  06/02/2011    Procedure:  ARTHROSCOPY KNEE;  Surgeon: Gearlean Alf;  Location: [facility];  Service: Orthopedics;  Laterality: Left;  LEFT KNEE ARTHROSCOPY WITH DEBRIDEMENT  . Total knee arthroplasty  12/20/2011    Procedure: TOTAL KNEE ARTHROPLASTY;  Surgeon: Gearlean Alf, MD;  Location: [facility];  Service: Orthopedics;  Laterality: Left;  . Back surgery    . Joint replacement    . Spine surgery      Prior to Admission medications   Medication Sig Start Date End Date Taking? Authorizing Provider  atorvastatin (LIPITOR) 20 MG tablet Take 20 mg by mouth daily.   Yes Historical Provider, MD  citalopram (CELEXA) 20 MG tablet Take 1.5 tablets (30 mg total) by mouth daily. 05/14/14  Yes Kennyth Arnold, FNP  clotrimazole-betamethasone (LOTRISONE) cream Apply 1 application topically 2 (two) times daily as needed (psoriasis).   Yes Historical Provider, MD  cyanocobalamin (,VITAMIN B-12,) 1000 MCG/ML injection Inject 37mL twice a month Patient taking differently: Inject 77mL into the skin twice a month 08/01/14  Yes Kennyth Arnold, FNP  docusate sodium (COLACE) 100 MG capsule Take 100 mg by mouth. Every other day   Yes Historical Provider, MD  ezetimibe (ZETIA) 10 MG tablet Take 1 tablet (10 mg total) by mouth daily. 08/22/13  Yes Ricard Dillon, MD  furosemide (LASIX) 20 MG tablet Take 2 tablets (40 mg total) by mouth 2 (two) times daily. Patient taking differently: Take 40 mg by mouth daily.  10/15/14  Yes Evans Lance, MD  ipratropium (ATROVENT) 0.06 % nasal spray Place 2 sprays into both nostrils 4 (four) times daily. Patient taking differently: Place 2 sprays into both nostrils 4 (four) times daily as needed for rhinitis.  08/22/13  Yes Ricard Dillon, MD  iron polysaccharides (NIFEREX) 150 MG capsule Take 150 mg by mouth 2 (two) times daily.   Yes Historical Provider, MD  lisinopril (PRINIVIL,ZESTRIL) 10 MG tablet Take 1 tablet (10 mg total) by mouth daily. 06/20/14  Yes Belkys A Regalado, MD  methotrexate  (RHEUMATREX) 2.5 MG tablet Take 4 tablets (10 mg total) by mouth once a week. Caution:Chemotherapy. Protect from light. Every monday 07/05/14  Yes Kennyth Arnold, FNP  mirabegron ER (MYRBETRIQ) 50 MG TB24 tablet Take 1 tablet (50 mg total) by mouth daily. 06/29/13  Yes Ricard Dillon, MD  pantoprazole (PROTONIX) 40 MG tablet Take 1 tablet (40 mg total) by mouth daily. 08/08/14  Yes Kennyth Arnold, FNP  predniSONE (DELTASONE) 5 MG tablet Take 1.5 tablets (7.5 mg total) by mouth daily. 06/17/14  Yes Kennyth Arnold, FNP  rOPINIRole (REQUIP) 2 MG tablet TAKE 1 TABLET (2 MG TOTAL) BY MOUTH 2 (TWO) TIMES DAILY. 08/26/14  Yes Kennyth Arnold, FNP  SYRINGE/NEEDLE, DISP, 1 ML 25G X 5/8" 1 ML MISC Use to inject 70ml of B-12 every two weeks 07/25/14  Yes Kennyth Arnold, FNP  triamcinolone cream (KENALOG) 0.1 % Apply to affected area as directed Patient taking differently: Apply 1 application topically daily as needed (rash). Apply to affected area as directed 04/03/14  Yes Kennyth Arnold, FNP  XARELTO 20 MG TABS tablet TAKE 1 TABLET (20 MG TOTAL) BY MOUTH DAILY WITH SUPPER. THIS IS THE NEW MEDICATION FOR STROKE PREVENTION 09/02/14  Yes Kennyth Arnold, FNP    Current Facility-Administered Medications  Medication Dose Route Frequency Provider Last Rate Last Dose  . 0.9 %  sodium chloride infusion   Intravenous Continuous Delfina Redwood, MD 100 mL/hr at 12/11/14 2011    . acetaminophen (TYLENOL) tablet 650 mg  650 mg Oral Q6H PRN Delfina Redwood, MD       Or  . acetaminophen (TYLENOL) suppository 650 mg  650 mg Rectal Q6H PRN Delfina Redwood, MD      . Derrill Memo ON 12/12/2014] atorvastatin (LIPITOR) tablet 20 mg  20 mg Oral Daily Delfina Redwood, MD      . Derrill Memo ON 12/12/2014] citalopram (CELEXA) tablet 30 mg  30 mg Oral Daily Delfina Redwood, MD      . ezetimibe (ZETIA) tablet 10 mg  10 mg Oral Daily Delfina Redwood, MD   10 mg at 12/11/14 2144  . [START ON 12/12/2014] ipratropium (ATROVENT) 0.06 % nasal  spray 2 spray  2 spray Each Nare QID Delfina Redwood, MD      . mirabegron ER [facility]) tablet 50 mg  50 mg Oral Daily Delfina Redwood, MD   50 mg at 12/11/14 2144  . ondansetron (ZOFRAN) tablet 4 mg  4 mg Oral Q6H PRN Delfina Redwood, MD       Or  . ondansetron [facility]) injection 4 mg  4 mg Intravenous Q6H PRN Delfina Redwood, MD      . pantoprazole (PROTONIX) EC tablet 40 mg  40 mg Oral BID Delfina Redwood, MD   40 mg at 12/11/14 2144  . [START ON 12/12/2014] predniSONE (DELTASONE) tablet 7.5 mg  7.5 mg Oral Daily Delfina Redwood, MD      . rOPINIRole (REQUIP) tablet 2 mg  2 mg Oral BID Delfina Redwood, MD   2 mg at 12/11/14 1904    Allergies as of 12/11/2014 - Review Complete 12/11/2014  Allergen Reaction Noted  . Ciprofloxacin Nausea Only   . Hydrocodone-acetaminophen  03/21/2014  . Other Other (See Comments) 12/16/2011    Family History  Problem Relation Age of Onset  . Stroke Mother   . Early death Father     History   Social History  . Marital Status: Married    Spouse Name: N/A  . Number of Children: N/A  . Years of Education: N/A   Occupational History  . retired    Social History Main Topics  . Smoking status: Former Smoker -- 0.10 packs/day for 55 years    Types: Cigarettes    Quit date: 12/07/1998  . Smokeless tobacco: Never Used  . Alcohol Use: Yes     Comment: RARE  . Drug Use: No  . Sexual Activity:    Partners: Female   Other Topics Concern  . Not on file   Social History Narrative    Review of Systems: Please see history of present illness for GI review of systems.  As noted above, no issue with syncope, but rather, just generalized weakness over the past several days.  Physical Exam: Vital signs in last 24 hours: Temp:  [98.4 F (36.9 C)-99.1 F (37.3 C)] 98.4 F (36.9 C) (06/22 2019) Pulse Rate:  [62-93] 62 (06/22 2019) Resp:  [14-22] 18 (06/22 2019) BP: (119-145)/(44-99) 145/55 mmHg (06/22 2019) SpO2:  [96 %-100 %]  98 % (06/22 2019) Weight:  [97.3 kg (214 lb 8.1 oz)] 97.3 kg (214 lb 8.1 oz) (06/22 2019) Last BM Date: 12/11/14 General:   Alert,  Well-developed, well-nourished, pleasant and cooperative in NAD Head:  Normocephalic and atraumatic. Eyes:  Sclera clear, no icterus.   Conjunctiva pink. Mouth:   No ulcerations or lesions.  Oropharynx pink & moist. Neck:   No masses or thyromegaly. Lungs:  Clear throughout to auscultation.   No wheezes, crackles, or rhonchi. No evident respiratory distress. Heart:   Regular rate and rhythm; no murmurs, clicks, rubs,  or gallops. Abdomen:  Soft, nontender, mildly tympanitic, and nondistended. No masses, hepatosplenomegaly or ventral hernias noted. Normal bowel sounds, without bruits, guarding, or rebound.   Rectal:  Per ER physician, showed heme positive stool   Msk:   Symmetrical without gross deformities. Pulses:  Normal radial pulse is noted. Extremities:   Without clubbing, cyanosis, or edema. Neurologic:  Alert and coherent;  seems to have some short-term memory impairment as reported in the record, but no obvious focal neurologic deficit noted. Skin:  Intact without significant lesions or rashes. Cervical Nodes:  No significant cervical adenopathy. Psych:   Alert and cooperative. Normal mood and affect.  Intake/Output from previous day:   Intake/Output this shift: Total I/O In: 240 [P.O.:240] Out: -   Lab Results:  Recent Labs  12/11/14 1300  WBC 19.6*  HGB 9.6*  HCT 31.2*  PLT 253   BMET  Recent Labs  12/11/14 1300  NA 139  K 4.3  CL 107  CO2 24  GLUCOSE 131*  BUN 19  CREATININE 1.19  CALCIUM 9.3   LFT No results for input(s): PROT, ALBUMIN, AST, ALT, ALKPHOS, BILITOT, BILIDIR, IBILI in the last 72 hours. PT/INR No results for input(s): LABPROT, INR in the last 72 hours.  Studies/Results: X-ray Chest Pa And Lateral  12/11/2014   CLINICAL DATA:  Acute onset of low grade fever, leukocytosis and weakness. Initial encounter.   EXAM: CHEST  2 VIEW  COMPARISON:  Chest radiograph performed 06/18/2014  FINDINGS: The lungs are well-aerated. Pulmonary vascularity is at the upper limits of normal. A nonspecific 1.1 cm nodule is noted at the right upper lung zone. There is no evidence of pleural effusion or pneumothorax.  The heart is borderline normal in size. A pacemaker is noted at the left chest wall, with leads ending at the right atrium and right ventricle. No acute osseous abnormalities are seen.  IMPRESSION: 1. No acute cardiopulmonary process seen. 2. Nonspecific 1.1 cm nodule at the right upper lung zone. CT of the chest would be helpful for further evaluation, on an elective nonemergent basis.   Electronically Signed   By: Garald Balding M.D.   On: 12/11/2014 21:37    Impression: 1. Heme positive stool 2. Anemia, not clearly due to iron deficiency, stable over the past month, but hemoglobin down from 6 months ago 3. Dark stools, possibly related to iron 4. Chronic anticoagulation; on PPI therapy 5. History of SVT, history of bradycardia status post pacemaker, history of embolic CVA (per outside records and patient) with short-term  memory loss 6. Past history of adenomatous colon polyps with recently negative colonoscopy (sigmoid diverticulosis present)  Plan: Although it is not clear that this patient is having active GI blood loss (BUN normal, hemoglobin stable, dark stools potentially accounted for by iron supplementation; however, he is Hemoccult-positive), I nonetheless feel that endoscopic evaluation is appropriate in view of his heme positivity, his chronic anemia, and his need for ongoing anticoagulation. I have reviewed the nature, purpose, and risks of upper endoscopy with the patient, who seems to understand and is agreeable to proceeding. Colonoscopy not needed in view of the fact it was performed just 3 months ago. Consideration could be given to capsule endoscopy evaluation of the small bowel if the upper  endoscopy is unrevealing.   LOS: 0 days   Jermaine Tholl V  12/11/2014, 10:14 PM   Pager [phone] If no answer or after 5 PM call [phone]

## 2014-12-12 NOTE — Interval H&P Note (Signed)
History and Physical Interval Note:  12/12/2014 3:00 PM  Christopher Baker  has presented today for surgery, with the diagnosis of Anemia and heme positive stool  The various methods of treatment have been discussed with the patient and family. After consideration of risks, benefits and other options for treatment, the patient has consented to  Procedure(s): ESOPHAGOGASTRODUODENOSCOPY (EGD) (N/A) as a surgical intervention .  The patient's history has been reviewed, patient examined, no change in status, stable for surgery.  I have reviewed the patient's chart and labs.  Questions were answered to the patient's satisfaction.     [place] C.

## 2014-12-12 NOTE — Brief Op Note (Addendum)
Medium-sized nonbleeding polypoid lesion in proximal stomach - s/p biopsies and diluted epi mixture injected due to bleeding post-biopsies. Would continue to hold Xarelto today. Follow H/Hs. Would watch in hospital overnight and if Hgb stable consider d/c in next 1-2 days. If Xarelto needs to be restarted at discharge then may need to keep in the hospital until Saturday unless Hgb rising. F/U on path. Clear liquid diet today and advance tomorrow as tolerated.

## 2014-12-13 ENCOUNTER — Encounter (HOSPITAL_COMMUNITY): Payer: Self-pay | Admitting: Gastroenterology

## 2014-12-13 DIAGNOSIS — I4891 Unspecified atrial fibrillation: Secondary | ICD-10-CM

## 2014-12-13 DIAGNOSIS — R911 Solitary pulmonary nodule: Secondary | ICD-10-CM | POA: Insufficient documentation

## 2014-12-13 DIAGNOSIS — K921 Melena: Secondary | ICD-10-CM

## 2014-12-13 LAB — CBC
HEMATOCRIT: 28.4 % — AB (ref 39.0–52.0)
HEMOGLOBIN: 8.6 g/dL — AB (ref 13.0–17.0)
MCH: 24.4 pg — ABNORMAL LOW (ref 26.0–34.0)
MCHC: 30.3 g/dL (ref 30.0–36.0)
MCV: 80.5 fL (ref 78.0–100.0)
Platelets: 226 10*3/uL (ref 150–400)
RBC: 3.53 MIL/uL — ABNORMAL LOW (ref 4.22–5.81)
RDW: 17.1 % — ABNORMAL HIGH (ref 11.5–15.5)
WBC: 9.6 10*3/uL (ref 4.0–10.5)

## 2014-12-13 NOTE — Discharge Summary (Signed)
Physician Discharge Summary  Christopher Baker YDX:[id number] DOB: [birth year] DOA: 12/11/2014  PCP: Haywood Pao, MD  Admit date: 12/11/2014 Discharge date: 12/13/2014  Time spent: greater than 30 minutes  Recommendations for Outpatient Follow-up:  1. F/u Dr. Earlean Shawl in July 2. Repeat CT chest 6 months, PCP to arrange 3. 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 male  With h/o embolic stroke, PAF, pacemaker, on xarelto, HTN, GERD, psoriatic arthritis, HLD came to ED with complaints of severe weakness, generalized. Has had black tarry stools x3 days. No n/v. Reports fever of 101 this am. No cough. Had recent colonoscopy by Dr. Earlean Shawl which showed diverticula, and EGD in 2011. Denies abdominal pain. Has orthostatic dizziness. In ED, afebrile with normal vital signs. BUN normal. hgb 9.5. UA negative for infection. WBC 19K. ST with new nonspecific lateral ST depression, but patient denies chest pain. Has h/o memory problems after previous CVA, so much of history is per chart review and wife. Stool heme positive by [facility] Course:  GI bleed: admitted to [facility]  Xarelto GI consulted.  EGD showed gastritis medium sized polypoid lesion which was biopsied. Polyp biopsied.  Path benign.  No further bleeding and Dr. Michail Sermon cleared resumption of  xarelto  By discharge, with IVF Weakness improved   Leukocytosis: improved. No further fevers. CXR without infiltrate. CT chest without infiltrate   Anemia: did not require transfusion   Abnormal EKG  CXR with nodule.  CT chest showed 5 mm nodule will need repeat CT chest.  Procedures:  EGD with biopsy of polyp  Consultations:  GI  Discharge Exam: Filed Vitals:   12/13/14 0815  BP: 159/55  Pulse: 63  Temp: 99.1 F (37.3 C)  Resp: 19    General: a and o Cardiovascular: RRR Respiratory: CTA  Discharge Instructions   Discharge Instructions    Diet - low sodium heart healthy    Complete by:  As directed      Discharge instructions    Complete by:  As directed   Repeat hemoglobin level next week with Dr. Osborne Casco.  Repeat CT scan chest in 6 months     Increase activity slowly    Complete by:  As directed           Current Discharge Medication List    CONTINUE these medications which have NOT CHANGED   Details  atorvastatin (LIPITOR) 20 MG tablet Take 20 mg by mouth daily.    citalopram (CELEXA) 20 MG tablet Take 1.5 tablets (30 mg total) by mouth daily. Qty: 135 tablet, Refills: 1    clotrimazole-betamethasone (LOTRISONE) cream Apply 1 application topically 2 (two) times daily as needed (psoriasis).    cyanocobalamin (,VITAMIN B-12,) 1000 MCG/ML injection Inject 10mL twice a month Qty: 10 mL, Refills: 2  docusate sodium (COLACE) 100 MG capsule Take 100 mg by mouth. Every other day    ezetimibe (ZETIA) 10 MG tablet Take 1 tablet (10 mg total) by mouth daily. Qty: 90 tablet, Refills: 3    furosemide (LASIX) 20 MG tablet Take 2 tablets (40 mg total) by mouth 2 (two) times daily. Qty: 360 tablet, Refills: 3   Associated Diagnoses: Edema    ipratropium (ATROVENT) 0.06 % nasal spray Place 2 sprays into both nostrils 4 (four) times daily. Qty: 45 mL, Refills: 3    iron polysaccharides (NIFEREX) 150 MG capsule Take 150 mg by mouth 2 (two) times daily.     lisinopril (PRINIVIL,ZESTRIL) 10 MG tablet Take 1 tablet (10 mg total) by mouth daily. Qty: 30 tablet, Refills: 0    methotrexate (RHEUMATREX) 2.5 MG tablet Take 4 tablets (10 mg total) by mouth once a week. Caution:Chemotherapy. Protect from light. Every monday Qty: 4 tablet, Refills: 1    mirabegron ER (MYRBETRIQ) 50 MG TB24 tablet Take 1 tablet (50 mg total) by mouth daily. Qty: 90 tablet, Refills: 3    pantoprazole (PROTONIX) 40 MG tablet Take 1 tablet (40 mg total) by mouth daily. Qty: 90 tablet, Refills: 3    predniSONE (DELTASONE) 5 MG tablet Take 1.5 tablets (7.5 mg total) by mouth daily. Qty: 135 tablet, Refills: 1    rOPINIRole (REQUIP) 2 MG tablet TAKE 1 TABLET (2 MG TOTAL) BY MOUTH 2 (TWO) TIMES DAILY. Qty: 60 tablet, Refills: 3    SYRINGE/NEEDLE, DISP, 1 ML 25G X 5/8" 1 ML MISC Use to inject 27ml of B-12 every two weeks Qty: 100 each, Refills: 1    triamcinolone cream (KENALOG) 0.1 % Apply to affected area as directed Qty: 480 g, Refills: 0    XARELTO 20 MG TABS tablet TAKE 1 TABLET (20 MG TOTAL) BY MOUTH DAILY WITH SUPPER. THIS IS THE NEW MEDICATION FOR STROKE PREVENTION Qty: 30 tablet, Refills: 3       Allergies  Allergen Reactions  . Ciprofloxacin Nausea Only    REACTION: GI upset  . Hydrocodone-Acetaminophen     Hallucinations in higher doses  . Other Other (See Comments)    SCALLOPS--SUDDEN GI ATTACK   Follow-up Information    Follow up with Haywood Pao, MD.   Specialty:  Internal Medicine   Why:  for hemoglobin check and repeat CT scan in 6 months   Contact information:   [place] [address] [phone]        The results of significant diagnostics from this hospitalization (including imaging, microbiology, ancillary and laboratory) are listed below for reference.    Significant Diagnostic Studies: X-ray Chest Pa And Lateral  12/11/2014   CLINICAL DATA:  Acute onset of low grade fever, leukocytosis and weakness.  Initial encounter.  EXAM: CHEST  2 VIEW  COMPARISON:  Chest radiograph performed 06/18/2014  FINDINGS: The lungs are well-aerated. Pulmonary vascularity is at the upper limits of normal. A nonspecific 1.1 cm nodule is noted at the right upper lung zone. There is no evidence of pleural effusion or pneumothorax.  The heart is borderline normal in size. A pacemaker is noted at the left chest wall, with leads ending at the right atrium and right ventricle. No acute osseous abnormalities are seen.  IMPRESSION: 1. No acute cardiopulmonary process seen. 2. Nonspecific 1.1 cm nodule at the right upper lung zone. CT of the chest would be helpful for further evaluation, on an elective nonemergent basis.   Electronically Signed  By: Garald Balding M.D.   On: 12/11/2014 21:37   Ct Chest Wo Contrast  12/12/2014   CLINICAL DATA:  Followup lung nodule.  Fever.  EXAM: CT CHEST WITHOUT CONTRAST  TECHNIQUE: Multidetector CT imaging of the chest was performed following the standard protocol without IV contrast.  COMPARISON:  Chest radiograph 12/11/2014, CT thorax 11/17/2006  FINDINGS: Mediastinum/Nodes: No axillary or supraclavicular lymphadenopathy. No mediastinal hilar adenopathy. Pacer leads noted. No pericardial fluid. Coronary calcifications present. Esophagus normal.  Lungs/Pleura: Mild bibasilar atelectasis and small effusions. 5 mm nodule in the superior segment of the right middle lobe on image 33, series 3. This may correspond to the nodule on comparison radiograph. This nodule is not found on comparison CT 2008.  Upper abdomen: Multiple cystic lesions within the liver are unchanged.  Musculoskeletal: Bulky is spurring of the thoracic spine.  IMPRESSION: 1. No acute cardiopulmonary findings. 2. Mild basilar atelectasis and small effusions. 3. 5 mm right lower lobe pulmonary nodule. If the patient is at high risk for bronchogenic carcinoma, follow-up chest CT at 6-12 months is recommended. If the patient is at low risk  for bronchogenic carcinoma, follow-up chest CT at 12 months is recommended. This recommendation follows the consensus statement: Guidelines for Management of Small Pulmonary Nodules Detected on CT Scans: A Statement from the Reading as published in Radiology 2005;237:395-400.   Electronically Signed   By: Suzy Bouchard M.D.   On: 12/12/2014 21:11    Microbiology: No results found for this or any previous visit (from the past 240 hour(s)).   Labs: Basic Metabolic Panel:  Recent Labs Lab 12/11/14 1300  NA 139  K 4.3  CL 107  CO2 24  GLUCOSE 131*  BUN 19  CREATININE 1.19  CALCIUM 9.3   Liver Function Tests: No results for input(s): AST, ALT, ALKPHOS, BILITOT, PROT, ALBUMIN in the last 168 hours. No results for input(s): LIPASE, AMYLASE in the last 168 hours. No results for input(s): AMMONIA in the last 168 hours. CBC:  Recent Labs Lab 12/11/14 1300 12/12/14 0652 12/13/14 0551  WBC 19.6* 12.1* 9.6  NEUTROABS  --  10.2*  --   HGB 9.6* 8.9* 8.6*  HCT 31.2* 29.1* 28.4*  MCV 80.4 81.5 80.5  PLT 253 200 226   Cardiac Enzymes:  Recent Labs Lab 12/11/14 1842 12/12/14 0035 12/12/14 0652  TROPONINI 0.04* 0.03 0.03   BNP: BNP (last 3 results) No results for input(s): BNP in the last 8760 hours.  ProBNP (last 3 results) No results for input(s): PROBNP in the last 8760 hours.  CBG: No results for input(s): GLUCAP in the last 168 hours.     SignedDelfina Redwood  [facility] 12/13/2014, 10:42 AM

## 2014-12-13 NOTE — Care Management Note (Signed)
Case Management Note  Patient Details  Name: Christopher Baker MRN: [id number] Date of Birth: June 12, 1934  Subjective/Objective:                 CM following for progression and d/c planning.   Action/Plan: No d/c needs identified, pt for d/c to home today , wife to assist as needed.   Expected Discharge Date:  12/13/14               Expected Discharge Plan:  Home/Self Care  In-House Referral:  NA  Discharge planning Services  NA  Post Acute Care Choice:    Choice offered to:  NA  DME Arranged:    DME Agency:     HH Arranged:    HH Agency:     Status of Service:     Medicare Important Message Given:  N/A - LOS <3 / Initial given by admissions Date Medicare IM Given:    Medicare IM give by:    Date Additional Medicare IM Given:    Additional Medicare Important Message give by:     If discussed at [place] of Stay Meetings, dates discussed:    Additional Comments:  Adron Bene, RN 12/13/2014, 12:05 PM

## 2014-12-13 NOTE — Progress Notes (Signed)
Patient ID: Christopher Baker, male   DOB: [birth year], 81 y.o.   MRN: [id number] [facility] Gastroenterology Progress Note  Christopher Baker 81 y.o. [birth year]   Subjective: Sitting in chair reading. No complaints. Nonbloody stool overnight.  Objective: Vital signs in last 24 hours: Filed Vitals:   12/13/14 0815  BP: 159/55  Pulse: 63  Temp: 99.1 F (37.3 C)  Resp: 19    Physical Exam: Gen: alert, no acute distress CV: RRR Chest: CTA B Abd: soft, nontender, nondistended, +BS  Lab Results:  Recent Labs  12/11/14 1300  NA 139  K 4.3  CL 107  CO2 24  GLUCOSE 131*  BUN 19  CREATININE 1.19  CALCIUM 9.3   No results for input(s): AST, ALT, ALKPHOS, BILITOT, PROT, ALBUMIN in the last 72 hours.  Recent Labs  12/12/14 0652 12/13/14 0551  WBC 12.1* 9.6  NEUTROABS 10.2*  --   HGB 8.9* 8.6*  HCT 29.1* 28.4*  MCV 81.5 80.5  PLT 200 226   No results for input(s): LABPROT, INR in the last 72 hours.    Assessment/Plan: S/P GI bleed on Xarelto. Hgb stable. Stomach polyp hyperplastic on biopsy. Advance diet. Ok to resume Xarelto but needs Hgb check by his regular doctor on Monday. Ok to go home from GI standpoint this afternoon with f/u with Dr. Earlean Shawl in July. Needs to take a PPI QD at home. Will sign off. Call if questions.   [place] C. 12/13/2014, 10:06 AM  Pager [phone]  If no answer or after 5 PM call [phone]

## 2014-12-16 ENCOUNTER — Other Ambulatory Visit: Payer: Self-pay

## 2014-12-17 ENCOUNTER — Encounter: Payer: PPO | Admitting: Vascular Surgery

## 2014-12-17 ENCOUNTER — Encounter (HOSPITAL_COMMUNITY): Payer: PPO

## 2014-12-24 ENCOUNTER — Encounter: Payer: PPO | Admitting: Vascular Surgery

## 2014-12-24 ENCOUNTER — Encounter (HOSPITAL_COMMUNITY): Payer: PPO

## 2014-12-25 ENCOUNTER — Other Ambulatory Visit: Payer: Self-pay | Admitting: Family

## 2014-12-25 NOTE — Telephone Encounter (Signed)
Prescription sent in  

## 2014-12-25 NOTE — Telephone Encounter (Signed)
Yes thanks 

## 2014-12-25 NOTE — Telephone Encounter (Signed)
Not scheduled to establish until May 2017 but can you approve this patient's refill?

## 2015-01-07 ENCOUNTER — Encounter: Payer: PPO | Admitting: *Deleted

## 2015-01-07 ENCOUNTER — Telehealth: Payer: Self-pay | Admitting: Cardiology

## 2015-01-07 NOTE — Telephone Encounter (Signed)
LMOVM reminding pt to send remote transmission.   

## 2015-01-08 ENCOUNTER — Encounter: Payer: Self-pay | Admitting: Cardiology

## 2015-01-20 ENCOUNTER — Encounter: Payer: Self-pay | Admitting: Internal Medicine

## 2015-01-20 ENCOUNTER — Other Ambulatory Visit: Payer: Self-pay | Admitting: Family

## 2015-01-20 ENCOUNTER — Telehealth: Payer: Self-pay | Admitting: Internal Medicine

## 2015-01-20 NOTE — Telephone Encounter (Signed)
New Message  Pt c/o medication issue:  1. Name of Medication: Xarelto  2. How are you currently taking this medication (dosage and times per day)? yes  3. Are you having a reaction (difficulty breathing--STAT)?   4. What is your medication issue?  Pt wife called states that her husband is on Xarelto was in the hospital for Upper GI bleed that they believe is due to Xarelto. Pt has just lost a layer of skin because of the bruising. Pt wife states that they have heard a lot of bad things about Xarelto and believe that the medication may not be a good fit for him to use as a blood thinner. Please call back to discuss.

## 2015-01-22 MED ORDER — APIXABAN 5 MG PO TABS: 5.0000 mg | ORAL_TABLET | Freq: Two times a day (BID) | ORAL | Status: DC

## 2015-01-22 NOTE — Telephone Encounter (Signed)
Pt aware of suggestions by Dr. Lovena Le that he switch from Xarelto to Eliquis 5 mg BID.  Spoke with pt and wife, would like to to stop Xarelto and start Eliquis 5mg  BID.  Eliquis Rx sent to preferred pharmacy.  Pt samples and discount card placed at front desk. Pt aware and no additional questions at this time.  During conversation pt wife stated that pt has seen Dr. Earlean Shawl and may have a possible procedure to remove part of his stomach. Told that if they need clearance Dr. Earlean Shawl office will need to contact our office.

## 2015-01-23 ENCOUNTER — Other Ambulatory Visit: Payer: Self-pay | Admitting: Family

## 2015-01-24 ENCOUNTER — Encounter (HOSPITAL_COMMUNITY)
Admission: RE | Admit: 2015-01-24 | Discharge: 2015-01-24 | Disposition: A | Discharge status: Home / Self Care | Payer: PPO | Source: Ambulatory Visit | Attending: Internal Medicine | Admitting: Internal Medicine

## 2015-01-24 DIAGNOSIS — D649 Anemia, unspecified: Secondary | ICD-10-CM | POA: Insufficient documentation

## 2015-01-24 MED ORDER — FERUMOXYTOL INJECTION 510 MG/17 ML
510.0000 mg | INTRAVENOUS | Status: DC
  Administered 2015-01-24: 510 mg via INTRAVENOUS
  Filled 2015-01-24: qty 17

## 2015-01-24 NOTE — Discharge Instructions (Signed)

## 2015-01-27 ENCOUNTER — Telehealth: Payer: Self-pay | Admitting: Family Medicine

## 2015-01-27 ENCOUNTER — Other Ambulatory Visit: Payer: Self-pay | Admitting: *Deleted

## 2015-01-27 MED ORDER — PREDNISONE 5 MG PO TABS: 7.5000 mg | ORAL_TABLET | Freq: Every day | ORAL | Status: DC

## 2015-01-27 NOTE — Telephone Encounter (Signed)
Refill request for Prednisone 5 mg take 1.5 mg po qd and send to [facility].

## 2015-01-27 NOTE — Telephone Encounter (Signed)
Rx called sent in

## 2015-01-31 ENCOUNTER — Ambulatory Visit (HOSPITAL_COMMUNITY)
Admission: RE | Admit: 2015-01-31 | Discharge: 2015-01-31 | Disposition: A | Discharge status: Home / Self Care | Payer: PPO | Source: Ambulatory Visit | Attending: Internal Medicine | Admitting: Internal Medicine

## 2015-01-31 DIAGNOSIS — D649 Anemia, unspecified: Secondary | ICD-10-CM | POA: Insufficient documentation

## 2015-01-31 MED ORDER — SODIUM CHLORIDE 0.9 % IV SOLN
510.0000 mg | INTRAVENOUS | Status: DC
  Administered 2015-01-31: 510 mg via INTRAVENOUS
  Filled 2015-01-31: qty 17

## 2015-02-06 ENCOUNTER — Telehealth: Payer: Self-pay | Admitting: Cardiology

## 2015-02-06 ENCOUNTER — Encounter: Payer: PPO | Admitting: *Deleted

## 2015-02-06 NOTE — Telephone Encounter (Signed)
Confirmed remote transmission w/ pt wife.   

## 2015-02-10 ENCOUNTER — Encounter: Payer: Self-pay | Admitting: Cardiology

## 2015-02-20 ENCOUNTER — Other Ambulatory Visit: Payer: Self-pay | Admitting: Urology

## 2015-02-20 DIAGNOSIS — Z923 Personal history of irradiation: Secondary | ICD-10-CM

## 2015-02-20 DIAGNOSIS — R319 Hematuria, unspecified: Secondary | ICD-10-CM

## 2015-02-23 ENCOUNTER — Other Ambulatory Visit: Payer: Self-pay | Admitting: Physical Medicine & Rehabilitation

## 2015-02-25 ENCOUNTER — Ambulatory Visit
Admission: RE | Admit: 2015-02-25 | Discharge: 2015-02-25 | Disposition: A | Discharge status: Home / Self Care | Payer: PPO | Source: Ambulatory Visit | Attending: Urology | Admitting: Urology

## 2015-02-25 DIAGNOSIS — Z923 Personal history of irradiation: Secondary | ICD-10-CM

## 2015-02-25 DIAGNOSIS — R319 Hematuria, unspecified: Secondary | ICD-10-CM

## 2015-02-25 MED ORDER — IOPAMIDOL (ISOVUE-300) INJECTION 61%
100.0000 mL | Freq: Once | INTRAVENOUS | Status: AC | PRN
Start: 2015-02-25 — End: 2015-02-25
  Administered 2015-02-25: 100 mL via INTRAVENOUS

## 2015-02-27 ENCOUNTER — Other Ambulatory Visit: Payer: Self-pay | Admitting: Physical Medicine & Rehabilitation

## 2015-03-03 ENCOUNTER — Other Ambulatory Visit: Payer: Self-pay | Admitting: Family Medicine

## 2015-03-03 NOTE — Telephone Encounter (Signed)
Pt has upcoming appt with Dr. Yong Channel to establish on 10/27/15.  Do you wish to fill until that appointment?

## 2015-03-05 MED ORDER — CITALOPRAM HYDROBROMIDE 20 MG PO TABS: 30.0000 mg | ORAL_TABLET | Freq: Every day | ORAL | Status: DC

## 2015-03-19 ENCOUNTER — Encounter: Payer: Self-pay | Admitting: *Deleted

## 2015-03-25 ENCOUNTER — Other Ambulatory Visit: Payer: Self-pay | Admitting: Internal Medicine

## 2015-03-25 NOTE — Telephone Encounter (Signed)
Send to pcp

## 2015-03-25 NOTE — Telephone Encounter (Signed)
Should this be sent to pcp? I dont see where Dr Lovena Le has been checking lipids. Please advise. Thanks, MI

## 2015-03-28 ENCOUNTER — Other Ambulatory Visit: Payer: Self-pay

## 2015-03-28 MED ORDER — EZETIMIBE 10 MG PO TABS: 10.0000 mg | ORAL_TABLET | Freq: Every day | ORAL | Status: DC

## 2015-03-28 NOTE — Telephone Encounter (Signed)
Questions answered for Tier exception of Zetia 10mg , from [facility]

## 2015-04-04 ENCOUNTER — Telehealth: Payer: Self-pay

## 2015-04-04 NOTE — Telephone Encounter (Signed)
Tier exception for Zetia denied by [employer]. Patient notified.

## 2015-04-06 ENCOUNTER — Other Ambulatory Visit: Payer: Self-pay | Admitting: Family

## 2015-04-14 ENCOUNTER — Telehealth: Payer: Self-pay

## 2015-04-14 NOTE — Telephone Encounter (Signed)
Message in inbox stating no additional encounter notes found. Closing encounter.

## 2015-04-14 NOTE — Telephone Encounter (Signed)
I received a call from the Cardiology office. They wanted to inform you that patient has No Showed to both appts that have been scheduled. Thanks!

## 2015-04-15 ENCOUNTER — Ambulatory Visit (INDEPENDENT_AMBULATORY_CARE_PROVIDER_SITE_OTHER): Payer: PPO | Admitting: Neurology

## 2015-04-15 ENCOUNTER — Encounter: Payer: Self-pay | Admitting: Neurology

## 2015-04-15 VITALS — BP 118/64 | HR 85 | Wt 221.4 lb

## 2015-04-15 DIAGNOSIS — G3184 Mild cognitive impairment, so stated: Secondary | ICD-10-CM | POA: Diagnosis not present

## 2015-04-15 DIAGNOSIS — G609 Hereditary and idiopathic neuropathy, unspecified: Secondary | ICD-10-CM | POA: Diagnosis not present

## 2015-04-15 DIAGNOSIS — I679 Cerebrovascular disease, unspecified: Secondary | ICD-10-CM | POA: Diagnosis not present

## 2015-04-15 DIAGNOSIS — R251 Tremor, unspecified: Secondary | ICD-10-CM | POA: Diagnosis not present

## 2015-04-15 DIAGNOSIS — I69919 Unspecified symptoms and signs involving cognitive functions following unspecified cerebrovascular disease: Secondary | ICD-10-CM

## 2015-04-15 DIAGNOSIS — I69319 Unspecified symptoms and signs involving cognitive functions following cerebral infarction: Secondary | ICD-10-CM | POA: Insufficient documentation

## 2015-04-15 NOTE — Patient Instructions (Addendum)
1.  Continue Eliquis, Lipitor and blood pressure control 2.  Follow up in one year

## 2015-04-15 NOTE — Progress Notes (Signed)
NEUROLOGY FOLLOW UP OFFICE NOTE  Christopher Baker [id number]  HISTORY OF PRESENT ILLNESS: Christopher Baker is a 81 year old right-handed man with hypertension, hyperlipidemia, paroxysmal atrial fibrillation, PPM implant for synocpe, and history of prostate cancer status post radiation in 2007, who follows up for vascular-related mild cognitive impairment, cardioembolic stroke and tremor.  UPDATE: In June, he was hospitalized for GI bleed.  He was switched from warfarin to Xarelto.  He is feeling much better.  HISTORY: I.  Cardio-embolic stroke He was admitted to the hospital on 04/03/13 with left hand and arm weakness and facial droop.  CT head revealed right basal ganglia and parietal lobe infarcts.  He was found to have atrial fibrillation.  He was started on Xarelto.  He was admitted to [facility] on 06/18/14 for confusion.  Episode lasted about 1 hour.  He was at dinner with his wife and had difficulty ordering from the menu.  His wife noted a left facial droop.  He did not have any slurred speech, focal numbness or weakness of the extremities.  His blood pressure was found to be in the 190s/80s.  CT of head revealed no abnormality.  Repeated CT of the head the following day showed no acute stroke.  Echo showed mildly decreased EF of 45-50%.  Carotid doppler showed 40% bilateral ICA stenosis.  An EEG was performed, which was negative.  Differential diagnosis included TIA verse seizure and he was started on Keppra 250mg  twice daily.  He was also restarted on lisinopril, which had been discontinued earlier in the month.  Since discharge, he exhibited lethargy and irritability.  Keppra was subsequently discontinued as a TIA was suspected rather than partial seizure.  II. Vascular cognitive impairment Over the last 2.5 years, he has had problems with memory.  He was repeating things and exhibited mental fogginess and lack of motivation.  He was experiencing depression and insomnia as well.  Procedural  tasks and executive functioning were intact.  He was found to have a B12 level of 52 and was started on B12 shots.  He and his wife had noticed improvement in his memory.  However, insomnia and depression were still both an issue.  He had been under a lot of stress.  He was started on Celexa for depression and Proson for insomnia.  Last year, he exhibited extreme lethargy, to the point where he was sleeping during the day, which he never previously done.  Sometimes, he was in such a deep sleep, it required repeated yelling and shaking to wake him up.  He seemed more "foggy" again.  One time, he couldn't figure out where to put the ice cream in the refrigerator.  When his wife told him to put it in the freezer, he was placing it in the vegetable drawer.  He decided to stop the Proson, and he and his wife noticed significant improvement in lethargy.  He did note some improvement in memory since starting B12 supplementation.  Memory seemed worse after the stroke, so cognitive deficits were thought to be also related to stroke.  He had a formal driving evaluation in July 2015.  He did well.  He was told to drive with someone else in the car for a couple of months so he can regain confidence.  The only restriction was that he should not drive at night unless it is a well-lit road.  He has since started driving alone sometimes, but only locally and during daylight hours.  He had formal neuropsych testing performed by Dr. Valentina Shaggy in January 2016.  Profile was consistent with outcome of a right hemispheric stroke: deficits in visual processing speed, non-dominant hand fine motor coordination/speed, and visual spatial dysfunction.  III Tremor He notes mild tremor in his hands, noticeable when he holds a utensil or writes.  He first noticed this about a month or so ago.  He has some balance issues due to bilateral knee replacement surgeries.  No family history of dementia or tremor (possibly his mother had tremor).   He takes ropinirole for RLS  PAST MEDICAL HISTORY: Past Medical History  Diagnosis Date  . Hyperlipidemia   . HTN (hypertension)   . Status post placement of cardiac pacemaker DDD-  06-04-10-- LAST CHECK 09-08-10 IN EPIC    SECONDARY TO SYNCOPY AND BRADYCARDIA  . Restless leg syndrome   . Carotid stenosis, bilateral PER DR EARLY / DUPLEX  09-09-10      40 - 59%   BILATERALLY--- ASYMPTOMATIC  . History of prostate cancer S/P RADIATION  [place]  6 YRS AGO  . Arthritis with psoriasis ([place])   . GERD (gastroesophageal reflux disease)     CONTROLLED W/ PROTONIX  . Borderline diabetes     DIET CONTROLLED - PT STATES HE IS NOT DIABETIC  . Lumbar spondylosis W/ RADICULOPATHY  . Iron deficiency   . Cataract immature LEFT EYE  . PAF (paroxysmal atrial fibrillation) ([place])   . SVT (supraventricular tachycardia) ([place])     S/P ABLATION   2008  . Sleep apnea TESTED YRS AGO-- NO CPAP RX GIVEN    PT STATES HE DOES NOT THINK HE STILL HAS SLEEP APNEA--DOES NOT USE CPAP  . Stroke [facility]) Oct. 14, 2014    MEDICATIONS: Current Outpatient Prescriptions on File Prior to Visit  Medication Sig Dispense Refill  . apixaban (ELIQUIS) 5 MG TABS tablet Take 1 tablet (5 mg total) by mouth 2 (two) times daily. 60 tablet 11  . atorvastatin (LIPITOR) 20 MG tablet Take 20 mg by mouth daily.    . citalopram (CELEXA) 20 MG tablet Take 1.5 tablets (30 mg total) by mouth daily. 135 tablet 1  . clotrimazole-betamethasone (LOTRISONE) cream Apply 1 application topically 2 (two) times daily as needed (psoriasis).    . cyanocobalamin (,VITAMIN B-12,) 1000 MCG/ML injection Inject 31mL twice a month (Patient taking differently: Inject 56mL into the skin twice a month) 10 mL 2  . docusate sodium (COLACE) 100 MG capsule Take 100 mg by mouth. Every other day    . ezetimibe (ZETIA) 10 MG tablet Take 1 tablet (10 mg total) by mouth daily. 90 tablet 1  . furosemide (LASIX) 20 MG tablet Take 2 tablets (40 mg total) by mouth 2 (two) times  daily. (Patient taking differently: Take 40 mg by mouth daily. ) 360 tablet 3  . ipratropium (ATROVENT) 0.06 % nasal spray Place 2 sprays into both nostrils 4 (four) times daily. (Patient taking differently: Place 2 sprays into both nostrils 4 (four) times daily as needed for rhinitis. ) 45 mL 3  . iron polysaccharides (NIFEREX) 150 MG capsule Take 150 mg by mouth 2 (two) times daily.    Marland Kitchen lisinopril (PRINIVIL,ZESTRIL) 10 MG tablet Take 1 tablet (10 mg total) by mouth daily. 30 tablet 0  . methotrexate (RHEUMATREX) 2.5 MG tablet Take 4 tablets (10 mg total) by mouth once a week. Caution:Chemotherapy. Protect from light. Every monday 4 tablet 1  . mirabegron ER (MYRBETRIQ) 50 MG TB24 tablet Take  1 tablet (50 mg total) by mouth daily. 90 tablet 3  . pantoprazole (PROTONIX) 40 MG tablet Take 1 tablet (40 mg total) by mouth daily. 90 tablet 3  . predniSONE (DELTASONE) 5 MG tablet Take 1.5 tablets (7.5 mg total) by mouth daily. 135 tablet 0  . rOPINIRole (REQUIP) 2 MG tablet TAKE 1 TABLET (2 MG TOTAL) BY MOUTH 2 (TWO) TIMES DAILY. 60 tablet 2  . SYRINGE/NEEDLE, DISP, 1 ML 25G X 5/8" 1 ML MISC Use to inject 59ml of B-12 every two weeks 100 each 1  . triamcinolone cream (KENALOG) 0.1 % Apply to affected area as directed (Patient taking differently: Apply 1 application topically daily as needed (rash). Apply to affected area as directed) 480 g 0   No current facility-administered medications on file prior to visit.    ALLERGIES: Allergies  Allergen Reactions  . Ciprofloxacin Nausea Only    REACTION: GI upset  . Hydrocodone-Acetaminophen     Hallucinations in higher doses  . Other Other (See Comments)    SCALLOPS--SUDDEN GI ATTACK    FAMILY HISTORY: Family History  Problem Relation Age of Onset  . Stroke Mother   . Early death Father     SOCIAL HISTORY: Social History   Social History  . Marital Status: Married    Spouse Name: N/A  . Number of Children: N/A  . Years of Education: N/A     Occupational History  . retired    Social History Main Topics  . Smoking status: Former Smoker -- 0.10 packs/day for 55 years    Types: Cigarettes    Quit date: 12/07/1998  . Smokeless tobacco: Never Used  . Alcohol Use: Yes     Comment: RARE  . Drug Use: No  . Sexual Activity:    Partners: Female   Other Topics Concern  . Not on file   Social History Narrative    REVIEW OF SYSTEMS: Constitutional: No fevers, chills, or sweats, no generalized fatigue, change in appetite Eyes: No visual changes, double vision, eye pain Ear, nose and throat: No hearing loss, ear pain, nasal congestion, sore throat Cardiovascular: No chest pain, palpitations Respiratory:  No shortness of breath at rest or with exertion, wheezes GastrointestinaI: No nausea, vomiting, diarrhea, abdominal pain, fecal incontinence Genitourinary:  No dysuria, urinary retention or frequency Musculoskeletal:  No neck pain, back pain Integumentary: No rash, pruritus, skin lesions Neurological: as above Psychiatric: No depression, insomnia, anxiety Endocrine: No palpitations, fatigue, diaphoresis, mood swings, change in appetite, change in weight, increased thirst Hematologic/Lymphatic:  No anemia, purpura, petechiae. Allergic/Immunologic: no itchy/runny eyes, nasal congestion, recent allergic reactions, rashes  PHYSICAL EXAM: Filed Vitals:   04/15/15 1420  BP: 118/64  Pulse: 85   General: No acute distress.  Patient appears well-groomed.   Head:  Normocephalic/atraumatic Eyes:  Fundoscopic exam unremarkable without vessel changes, exudates, hemorrhages or papilledema. Neck: supple, no paraspinal tenderness, full range of motion Heart:  Regular rate and rhythm Lungs:  Clear to auscultation bilaterally Back: No paraspinal tenderness Neurological Exam: alert and oriented to person, place, and time. Attention span and concentration intact, delayed recall poor, remote memory intact, fund of knowledge intact.   Speech fluent and not dysarthric, language intact.    Montreal Cognitive Assessment  04/15/2015 07/09/2014  Visuospatial/ Executive (0/5) 4 4  Naming (0/3) 3 3  Attention: Read list of digits (0/2) 2 2  Attention: Read list of letters (0/1) 1 1  Attention: Serial 7 subtraction starting at 100 (0/3) 3 3  Language: Repeat phrase (0/2) 2 2  Language : Fluency (0/1) 1 1  Abstraction (0/2) 2 2  Delayed Recall (0/5) 4 0  Orientation (0/6) 5 6  Total 27 24  Adjusted Score (based on education) 27 24   CN II-XII intact. Fundoscopic exam unremarkable without vessel changes, exudates, hemorrhages or papilledema.  Bulk and tone normal, muscle strength 5/5 throughout.  Reduced finger-thumb tapping speed bilaterally.  Finger to nose testing intact with fine postural and kinetic tremor.  Reduced pinprick and vibration sensation in feet and hands.  Deep tendon reflexes trace throughout except absent in ankles and right patellars.  Gait normal stance and stride.    IMPRESSION: Cerebrovascular disease with recurrent TIA Mild cognitive impairment, vascular related. Tremor Idiopathic peripheral neuropathy  PLAN: Continue Xarelto, blood pressure management and statin Follow up in one year  21 minutes spent face to face with patient, over 50% spent discussing management.  Metta Clines, DO  CC:  Dutch Quint

## 2015-04-22 ENCOUNTER — Encounter: Payer: Self-pay | Admitting: *Deleted

## 2015-05-02 ENCOUNTER — Other Ambulatory Visit: Payer: Self-pay | Admitting: *Deleted

## 2015-05-02 MED ORDER — PREDNISONE 5 MG PO TABS: 7.5000 mg | ORAL_TABLET | Freq: Every day | ORAL | Status: DC

## 2015-05-19 ENCOUNTER — Encounter: Payer: Self-pay | Admitting: *Deleted

## 2015-05-29 ENCOUNTER — Other Ambulatory Visit: Payer: Self-pay | Admitting: Physician Assistant

## 2015-05-30 ENCOUNTER — Other Ambulatory Visit: Payer: Self-pay | Admitting: Physician Assistant

## 2015-05-30 ENCOUNTER — Telehealth: Payer: Self-pay | Admitting: Internal Medicine

## 2015-05-30 DIAGNOSIS — M19011 Primary osteoarthritis, right shoulder: Secondary | ICD-10-CM

## 2015-05-30 NOTE — Telephone Encounter (Signed)
Request for surgical clearance:  1. What type of surgery is being performed? CT Arthrogram of rt shoulder  2. When is this surgery scheduled? pending  3. Are there any medications that need to be held prior to surgery and how long?Eliquis for 48hrs   4. Name of physician performing surgery? [facility]   5. What is your office phone and fax number? Fax [phone] 6.

## 2015-05-30 NOTE — Telephone Encounter (Signed)
Pt has a CHADS score of 4, including a history of CVA and TIA.  Would request holding Eliquis for 36 hours (3 doses) given his high risk and restart ASAP after procedure.  Will fax to [place].

## 2015-06-17 ENCOUNTER — Encounter: Payer: Self-pay | Admitting: *Deleted

## 2015-06-18 ENCOUNTER — Telehealth: Payer: Self-pay | Admitting: Internal Medicine

## 2015-06-18 ENCOUNTER — Ambulatory Visit (INDEPENDENT_AMBULATORY_CARE_PROVIDER_SITE_OTHER): Payer: PPO | Admitting: Pulmonary Disease

## 2015-06-18 ENCOUNTER — Encounter: Payer: Self-pay | Admitting: Pulmonary Disease

## 2015-06-18 VITALS — BP 118/64 | HR 75 | Ht 75.0 in | Wt 227.6 lb

## 2015-06-18 DIAGNOSIS — Z9989 Dependence on other enabling machines and devices: Principal | ICD-10-CM

## 2015-06-18 DIAGNOSIS — G4733 Obstructive sleep apnea (adult) (pediatric): Secondary | ICD-10-CM | POA: Diagnosis not present

## 2015-06-18 NOTE — Telephone Encounter (Signed)
Patients wife called and asked if we had received a clearance request for shoulder surgery from [facility] for a surgery on 06-26-14.  I advised that I would send to Dr and nurse to check.

## 2015-06-18 NOTE — Progress Notes (Signed)
Current Outpatient Prescriptions on File Prior to Visit  Medication Sig  . apixaban (ELIQUIS) 5 MG TABS tablet Take 1 tablet (5 mg total) by mouth 2 (two) times daily.  Marland Kitchen atorvastatin (LIPITOR) 20 MG tablet Take 20 mg by mouth daily.  . citalopram (CELEXA) 20 MG tablet Take 1.5 tablets (30 mg total) by mouth daily.  . clotrimazole-betamethasone (LOTRISONE) cream Apply 1 application topically 2 (two) times daily as needed (psoriasis).  . cyanocobalamin (,VITAMIN B-12,) 1000 MCG/ML injection Inject 16mL twice a month (Patient taking differently: Inject 61mL into the skin twice a month)  . docusate sodium (COLACE) 100 MG capsule Take 100 mg by mouth as needed. Every other day  . ezetimibe (ZETIA) 10 MG tablet Take 1 tablet (10 mg total) by mouth daily.  . furosemide (LASIX) 20 MG tablet Take 2 tablets (40 mg total) by mouth 2 (two) times daily. (Patient taking differently: Take 40 mg by mouth daily. )  . ipratropium (ATROVENT) 0.06 % nasal spray Place 2 sprays into both nostrils 4 (four) times daily. (Patient taking differently: Place 2 sprays into both nostrils 4 (four) times daily as needed for rhinitis. )  . lisinopril (PRINIVIL,ZESTRIL) 10 MG tablet Take 1 tablet (10 mg total) by mouth daily.  . methotrexate (RHEUMATREX) 2.5 MG tablet Take 4 tablets (10 mg total) by mouth once a week. Caution:Chemotherapy. Protect from light. Every monday  . mirabegron ER (MYRBETRIQ) 50 MG TB24 tablet Take 1 tablet (50 mg total) by mouth daily.  . pantoprazole (PROTONIX) 40 MG tablet Take 1 tablet (40 mg total) by mouth daily.  . predniSONE (DELTASONE) 5 MG tablet Take 1.5 tablets (7.5 mg total) by mouth daily.  Marland Kitchen rOPINIRole (REQUIP) 2 MG tablet TAKE 1 TABLET (2 MG TOTAL) BY MOUTH 2 (TWO) TIMES DAILY.  . SYRINGE/NEEDLE, DISP, 1 ML 25G X 5/8" 1 ML MISC Use to inject 11ml of B-12 every two weeks  . triamcinolone cream (KENALOG) 0.1 % Apply to affected area as directed (Patient taking differently: Apply 1 application  topically daily as needed (rash). Apply to affected area as directed)   No current facility-administered medications on file prior to visit.     Chief Complaint  Patient presents with  . Follow-up    Pt wears CPAP most night x 2-5 hours. Pt recently received a new mask. DME [facility].       Tests PSG 12/21/13 >> AHI 73 Echo 10/10/14 >> EF [id number], grade diastolic dysfx, mod AS, mild MR, PAS 29 mmHg Auto CPAP 05/19/15 to 06/17/15 >> used on 25 of 30 nights with average 2 hrs and 37 min.  Average AHI is 15.8 with median CPAP 8 cm H2O and 95 th percentile CPAP 10 cm H20.   Past medical hx HLD, HTN, s/p PM, PAF, CVA, RLS, Prostate cancer, Psoriatic arthritis, Lumbar spondylosis  Past surgical hx, Allergies, Family hx, Social hx all reviewed.  Vital Signs BP 118/64 mmHg  Pulse 75  Ht 6\' 3"  (1.905 m)  Wt 227 lb 9.6 oz (103.239 kg)  BMI 28.45 kg/m2  SpO2 98%  History of Present Illness Christopher Baker is a 81 y.o. male with OSA.  He was previously followed by Dr. Gwenette Greet.  He changed DME's to [facility] and likes this better.  He recently got a new mask and this fits better.  He is using CPAP more.  His main issue is that he goes to bathroom and forgets to put mask back on.  He feels better when he  uses CPAP.  He had CT chest in June 2016 which showed small lung nodule.  He has hx of smoking.  Physical Exam  General - No distress ENT - No sinus tenderness, no oral exudate, no LAN Cardiac - s1s2 regular, no murmur Chest - No wheeze/rales/dullness Back - No focal tenderness Abd - Soft, non-tender Ext - No edema Neuro - Normal strength Skin - No rashes Psych - normal mood, and behavior   Assessment/Plan  Obstructive sleep apnea. Plan: - discussed techniques to ensure he uses CPAP on more regular basis - continue auto CPAP setting - reviewed health effects of untreated sleep apnea  Lung nodule with hx of smoking. Plan: - he will check with his PCP about scheduling f/u CT  chest   Patient Instructions  Follow up in 1 year     Chesley Mires, MD [place] Pager:  [phone]

## 2015-06-18 NOTE — Telephone Encounter (Signed)
New Message  Pt request a call back to discuss if being off of eliquis for 5 days is ok for the surgery that is scheduled. Pt request a call back for instruction clarification.

## 2015-06-18 NOTE — Patient Instructions (Signed)
Follow up in 1 year.

## 2015-06-19 ENCOUNTER — Other Ambulatory Visit: Payer: PPO

## 2015-06-19 NOTE — H&P (Signed)
Christopher Baker is an 81 y.o. male.    Chief Complaint: right shoulder pain  HPI: Pt is a 81 y.o. male complaining of right shoulder pain for multiple years. Pain had continually increased since the beginning. X-rays in the clinic show end-stage arthritic changes of the right shoulder. Pt has tried various conservative treatments which have failed to alleviate their symptoms, including injections and therapy. Various options are discussed with the patient. Risks, benefits and expectations were discussed with the patient. Patient understand the risks, benefits and expectations and wishes to proceed with surgery.   PCP:  Haywood Pao, MD  D/C Plans: Home  PMH: Past Medical History  Diagnosis Date  . Hyperlipidemia   . HTN (hypertension)   . Status post placement of cardiac pacemaker DDD-  06-04-10-- LAST CHECK 09-08-10 IN EPIC    SECONDARY TO SYNCOPY AND BRADYCARDIA  . Restless leg syndrome   . Carotid stenosis, bilateral PER DR EARLY / DUPLEX  09-09-10      40 - 59%   BILATERALLY--- ASYMPTOMATIC  . History of prostate cancer S/P RADIATION  TX  6 YRS AGO  . Arthritis with psoriasis ([place])   . GERD (gastroesophageal reflux disease)     CONTROLLED W/ PROTONIX  . Borderline diabetes     DIET CONTROLLED - PT STATES HE IS NOT DIABETIC  . Lumbar spondylosis W/ RADICULOPATHY  . Iron deficiency   . Cataract immature LEFT EYE  . PAF (paroxysmal atrial fibrillation) ([place])   . SVT (supraventricular tachycardia) ([place])     S/P ABLATION   2008  . Sleep apnea TESTED YRS AGO-- NO CPAP RX GIVEN    PT STATES HE DOES NOT THINK HE STILL HAS SLEEP APNEA--DOES NOT USE CPAP  . Stroke [facility]) Oct. 14, 2014    PSH: Past Surgical History  Procedure Laterality Date  . Replacement total knee  1997    RIGHT  . Cardiac pacemaker placement  06-04-10    DDD/ AND REMOVAL LOOR RECORDER  . Loop recorder placement  01-30-10  . Lumbar laminectomy/ diskectomy/ fusion  05-19-10    L4 - 5  . Cholecystectomy   2009  . Cardiac electrophysiology study and ablation  2008    FOR SVT  . Prostate biopsy  2007  . Transurethral resection of prostate  2006  . Inguinal hernia repair  2005    LEFT/   DONE WITH PENILE PROSTESIS SURG.  . Removal and placement penile prosthesis implant   2005    AND LEFT CORPOROPLASTY /    DONE INGUINAL REPAIR  . Penile prosthesis implant  1995  . Knee arthroscopy  06/02/2011    Procedure: ARTHROSCOPY KNEE;  Surgeon: Gearlean Alf;  Location: [facility];  Service: Orthopedics;  Laterality: Left;  LEFT KNEE ARTHROSCOPY WITH DEBRIDEMENT  . Total knee arthroplasty  12/20/2011    Procedure: TOTAL KNEE ARTHROPLASTY;  Surgeon: Gearlean Alf, MD;  Location: [facility];  Service: Orthopedics;  Laterality: Left;  . Back surgery    . Joint replacement    . Spine surgery    . Esophagogastroduodenoscopy N/A 12/12/2014    Procedure: ESOPHAGOGASTRODUODENOSCOPY (EGD);  Surgeon: Wilford Corner, MD;  Location: [facility] ENDOSCOPY;  Service: Endoscopy;  Laterality: N/A;    Social History:  reports that he quit smoking about 16 years ago. His smoking use included Cigarettes. He has a 5.5 pack-year smoking history. He has never used smokeless tobacco. He reports that he drinks alcohol. He reports that he does  not use illicit drugs.  Allergies:  Allergies  Allergen Reactions  . Ciprofloxacin Nausea Only    REACTION: GI upset  . Hydrocodone-Acetaminophen     Hallucinations in higher doses  . Other Other (See Comments)    SCALLOPS--SUDDEN GI ATTACK    Medications: No current facility-administered medications for this encounter.   Current Outpatient Prescriptions  Medication Sig Dispense Refill  . apixaban (ELIQUIS) 5 MG TABS tablet Take 1 tablet (5 mg total) by mouth 2 (two) times daily. 60 tablet 11  . atorvastatin (LIPITOR) 20 MG tablet Take 20 mg by mouth daily.    . citalopram (CELEXA) 20 MG tablet Take 1.5 tablets (30 mg total) by mouth daily. 135 tablet 1  .  clotrimazole-betamethasone (LOTRISONE) cream Apply 1 application topically 2 (two) times daily as needed (psoriasis).    . cyanocobalamin (,VITAMIN B-12,) 1000 MCG/ML injection Inject 78mL twice a month (Patient taking differently: Inject 69mL into the skin twice a month) 10 mL 2  . docusate sodium (COLACE) 100 MG capsule Take 100 mg by mouth as needed. Every other day    . ezetimibe (ZETIA) 10 MG tablet Take 1 tablet (10 mg total) by mouth daily. 90 tablet 1  . furosemide (LASIX) 20 MG tablet Take 2 tablets (40 mg total) by mouth 2 (two) times daily. (Patient taking differently: Take 40 mg by mouth daily. ) 360 tablet 3  . ipratropium (ATROVENT) 0.06 % nasal spray Place 2 sprays into both nostrils 4 (four) times daily. (Patient taking differently: Place 2 sprays into both nostrils 4 (four) times daily as needed for rhinitis. ) 45 mL 3  . lisinopril (PRINIVIL,ZESTRIL) 10 MG tablet Take 1 tablet (10 mg total) by mouth daily. 30 tablet 0  . methotrexate (RHEUMATREX) 2.5 MG tablet Take 4 tablets (10 mg total) by mouth once a week. Caution:Chemotherapy. Protect from light. Every monday 4 tablet 1  . mirabegron ER (MYRBETRIQ) 50 MG TB24 tablet Take 1 tablet (50 mg total) by mouth daily. 90 tablet 3  . pantoprazole (PROTONIX) 40 MG tablet Take 1 tablet (40 mg total) by mouth daily. 90 tablet 3  . predniSONE (DELTASONE) 5 MG tablet Take 1.5 tablets (7.5 mg total) by mouth daily. 135 tablet 0  . rOPINIRole (REQUIP) 2 MG tablet TAKE 1 TABLET (2 MG TOTAL) BY MOUTH 2 (TWO) TIMES DAILY. 60 tablet 2  . SYRINGE/NEEDLE, DISP, 1 ML 25G X 5/8" 1 ML MISC Use to inject 27ml of B-12 every two weeks 100 each 1  . triamcinolone cream (KENALOG) 0.1 % Apply to affected area as directed (Patient taking differently: Apply 1 application topically daily as needed (rash). Apply to affected area as directed) 480 g 0    No results found for this or any previous visit (from the past 48 hour(s)). No results found.  ROS: Pain with  rom of the right upper extremity  Physical Exam:  Alert and oriented 81 y.o. male in no acute distress Cranial nerves 2-12 intact Cervical spine: full rom with no tenderness, nv intact distally Chest: active breath sounds bilaterally, no wheeze rhonchi or rales Heart: regular rate and rhythm, no murmur Abd: non tender non distended with active bowel sounds Hip is stable with rom  Right shoulder with moderate decreased rom Crepitus with rom No rashes or edema nv intact distally Strength of ER and IR 4/5   Assessment/Plan Assessment: right shoulder endstage osteoarthritis  Plan: Patient will undergo a right total vs reverse total shoulder by Dr. Veverly Fells at  [facility]. Risks benefits and expectations were discussed with the patient. Patient understand risks, benefits and expectations and wishes to proceed.

## 2015-06-19 NOTE — Telephone Encounter (Signed)
lmom stating Dr Lovena Le is not here to give the clearance until nest Thurs and I will fax over at that time.  His Eliquis was held for 3 days previously for arthrogram of shoulder.    Expand All Collapse All   Pt has a CHADS score of 4, including a history of CVA and TIA. Would request holding Eliquis for 36 hours (3 doses) given his high risk and restart ASAP after procedure.

## 2015-06-20 ENCOUNTER — Inpatient Hospital Stay: Admission: RE | Admit: 2015-06-20 | Payer: PPO | Source: Ambulatory Visit

## 2015-06-20 NOTE — Pre-Procedure Instructions (Signed)
Christopher Baker  06/20/2015      [facility] - [place], [address] [place] [address] Phone: [phone] Fax: [place], [place] [address] [place] [place] [address] Phone: [phone] Fax: [phone]    Your procedure is scheduled on Friday, [name]. 6th   Report to [facility] at 12:00 Noon   Call this number if you have problems the morning of surgery:  [phone]   Remember:  Do not eat food or drink liquids after midnight Thursday.  Take these medicines the morning of surgery with A SIP OF WATER : Celexa, Protonix.  Please use your inhaler the morning of surgery.   Do not wear jewelry - no rings or watches.  Do not wear lotions or colognes.  You may NOT wear deodorant the day of surgery.              Men may shave face and neck.  Do not bring valuables to the hospital.  [employer] [facility] is not responsible for any belongings or valuables.  Contacts, dentures or bridgework may not be worn into surgery.  Leave your suitcase in the car.  After surgery it may be brought to your room. For patients admitted to the hospital, discharge time will be determined by your treatment team.    Name and phone number of your driver:     Please read over the following fact sheets that you were given. Pain Booklet, Coughing and Deep Breathing, MRSA Information and Surgical Site Infection Prevention

## 2015-06-20 NOTE — Telephone Encounter (Signed)
Lm on VM for Christopher Baker at [employer] the same message that [place] left on 12/29. Dr. Lovena Le will not be in office until 1/5.

## 2015-06-20 NOTE — Telephone Encounter (Signed)
Margarita Grizzle from Dr. Gilberto Better office calling to clarify  holding Eliquis.  States the CT is on Wed 1/4 and then surgery on 1/6.  Nothing was clarified about stopping prior to surgery.  Spoke w/Hao Meng,PA/flex who states he should stop 3 doses prior to CT.  May take AM dose of Eliquis on Monday 1/2; hold PM dose and both doses on Tuesday 1/3.  Eliquis should not be restarted until after surgery and Dr. Veverly Fells approves the restart. Notified Margarita Grizzle who states she will notify patient. She has the clearance from Dr. Lovena Le.

## 2015-06-20 NOTE — Telephone Encounter (Signed)
Follow up    Office calling back regarding message on  12/28 .   Patient has upcoming surgery

## 2015-06-24 ENCOUNTER — Other Ambulatory Visit: Payer: Self-pay | Admitting: Orthopedic Surgery

## 2015-06-24 ENCOUNTER — Telehealth: Payer: Self-pay | Admitting: Internal Medicine

## 2015-06-24 ENCOUNTER — Telehealth: Payer: Self-pay | Admitting: Pulmonary Disease

## 2015-06-24 ENCOUNTER — Encounter (HOSPITAL_COMMUNITY)
Admission: RE | Admit: 2015-06-24 | Discharge: 2015-06-24 | Disposition: A | Discharge status: Home / Self Care | Payer: PPO | Source: Ambulatory Visit | Attending: Orthopedic Surgery | Admitting: Orthopedic Surgery

## 2015-06-24 ENCOUNTER — Encounter (HOSPITAL_COMMUNITY): Payer: Self-pay

## 2015-06-24 DIAGNOSIS — E785 Hyperlipidemia, unspecified: Secondary | ICD-10-CM | POA: Diagnosis not present

## 2015-06-24 DIAGNOSIS — G8918 Other acute postprocedural pain: Secondary | ICD-10-CM | POA: Diagnosis not present

## 2015-06-24 DIAGNOSIS — I480 Paroxysmal atrial fibrillation: Secondary | ICD-10-CM | POA: Diagnosis not present

## 2015-06-24 DIAGNOSIS — R918 Other nonspecific abnormal finding of lung field: Secondary | ICD-10-CM | POA: Diagnosis not present

## 2015-06-24 DIAGNOSIS — I739 Peripheral vascular disease, unspecified: Secondary | ICD-10-CM | POA: Diagnosis not present

## 2015-06-24 DIAGNOSIS — R7303 Prediabetes: Secondary | ICD-10-CM | POA: Diagnosis not present

## 2015-06-24 DIAGNOSIS — I10 Essential (primary) hypertension: Secondary | ICD-10-CM | POA: Diagnosis not present

## 2015-06-24 DIAGNOSIS — R911 Solitary pulmonary nodule: Secondary | ICD-10-CM

## 2015-06-24 DIAGNOSIS — Z950 Presence of cardiac pacemaker: Secondary | ICD-10-CM | POA: Diagnosis not present

## 2015-06-24 DIAGNOSIS — G4733 Obstructive sleep apnea (adult) (pediatric): Secondary | ICD-10-CM | POA: Diagnosis not present

## 2015-06-24 DIAGNOSIS — G2581 Restless legs syndrome: Secondary | ICD-10-CM | POA: Diagnosis not present

## 2015-06-24 DIAGNOSIS — M19011 Primary osteoarthritis, right shoulder: Secondary | ICD-10-CM | POA: Diagnosis not present

## 2015-06-24 DIAGNOSIS — Z7952 Long term (current) use of systemic steroids: Secondary | ICD-10-CM | POA: Diagnosis not present

## 2015-06-24 DIAGNOSIS — Z883 Allergy status to other anti-infective agents status: Secondary | ICD-10-CM | POA: Diagnosis not present

## 2015-06-24 DIAGNOSIS — Z886 Allergy status to analgesic agent status: Secondary | ICD-10-CM | POA: Diagnosis not present

## 2015-06-24 DIAGNOSIS — I350 Nonrheumatic aortic (valve) stenosis: Secondary | ICD-10-CM | POA: Diagnosis not present

## 2015-06-24 DIAGNOSIS — Z7901 Long term (current) use of anticoagulants: Secondary | ICD-10-CM | POA: Diagnosis not present

## 2015-06-24 DIAGNOSIS — L409 Psoriasis, unspecified: Secondary | ICD-10-CM | POA: Diagnosis not present

## 2015-06-24 DIAGNOSIS — K219 Gastro-esophageal reflux disease without esophagitis: Secondary | ICD-10-CM | POA: Diagnosis not present

## 2015-06-24 DIAGNOSIS — Z96611 Presence of right artificial shoulder joint: Secondary | ICD-10-CM | POA: Diagnosis not present

## 2015-06-24 DIAGNOSIS — Z471 Aftercare following joint replacement surgery: Secondary | ICD-10-CM | POA: Diagnosis not present

## 2015-06-24 DIAGNOSIS — Z87891 Personal history of nicotine dependence: Secondary | ICD-10-CM | POA: Diagnosis not present

## 2015-06-24 DIAGNOSIS — Z8546 Personal history of malignant neoplasm of prostate: Secondary | ICD-10-CM | POA: Diagnosis not present

## 2015-06-24 DIAGNOSIS — Z8673 Personal history of transient ischemic attack (TIA), and cerebral infarction without residual deficits: Secondary | ICD-10-CM | POA: Diagnosis not present

## 2015-06-24 HISTORY — DX: Cardiac arrhythmia, unspecified: I49.9

## 2015-06-24 LAB — BASIC METABOLIC PANEL
Anion gap: 5 (ref 5–15)
BUN: 19 mg/dL (ref 6–20)
CHLORIDE: 109 mmol/L (ref 101–111)
CO2: 28 mmol/L (ref 22–32)
Calcium: 9.1 mg/dL (ref 8.9–10.3)
Creatinine, Ser: 1.26 mg/dL — ABNORMAL HIGH (ref 0.61–1.24)
GFR calc Af Amer: >60 mL/min (ref >60)
GFR calc non Af Amer: 52 mL/min — ABNORMAL LOW (ref >60)
GLUCOSE: 98 mg/dL (ref 65–99)
POTASSIUM: 4.4 mmol/L (ref 3.5–5.1)
Sodium: 142 mmol/L (ref 135–145)

## 2015-06-24 LAB — CBC
HCT: 34.2 % — ABNORMAL LOW (ref 39.0–52.0)
Hemoglobin: 10.9 g/dL — ABNORMAL LOW (ref 13.0–17.0)
MCH: 28.8 pg (ref 26.0–34.0)
MCHC: 31.9 g/dL (ref 30.0–36.0)
MCV: 90.5 fL (ref 78.0–100.0)
Platelets: 210 10*3/uL (ref 150–400)
RBC: 3.78 MIL/uL — AB (ref 4.22–5.81)
RDW: 16 % — ABNORMAL HIGH (ref 11.5–15.5)
WBC: 9.7 10*3/uL (ref 4.0–10.5)

## 2015-06-24 LAB — PROTIME-INR
INR: 1.01 (ref 0.00–1.49)
PROTHROMBIN TIME: 13.5 s (ref 11.6–15.2)

## 2015-06-24 LAB — SURGICAL PCR SCREEN
MRSA, PCR: NEGATIVE
Staphylococcus aureus: NEGATIVE

## 2015-06-24 NOTE — Progress Notes (Addendum)
Currently seeing Dr. Beckie Salts - who put the [employer] Pacer in 2011.  Had stroke in 03/2013. Has some short term memory loss.. Pacer/ICD sheet faxed to [facility] Friday.  LOV was April 2016.  Cardiac, medical & GI clearances inside chart.  In 1997, with 1st knee replacement, he had staph.  Also a pain medication, per wife, made him combative (not sure which one). Since he had prostate surgery, he does have occasional incontinence now.   PCP is [facility] Sleep Study was done.  Dr. Halford Chessman - lov was 06/18/2015.  Came out negative. Neuro MD is Dr. Loretta Plume. Was told by office to have last dose of Eliquis on Sunday, and to restart after surgery.  I have called his wife [name] and let her know.

## 2015-06-24 NOTE — Telephone Encounter (Signed)
Please send order for CT chest w/o contrast to follow up lung nodule.

## 2015-06-24 NOTE — Telephone Encounter (Signed)
Spoke with Debbie.  Gave instructions that were given to pt from phone note on 12/28.

## 2015-06-24 NOTE — Telephone Encounter (Signed)
Needs to check on the status of the CT.   She talked to [place] at [facility] about keeping the appt tomorrow if we will order it  Call pt back at [phone]

## 2015-06-24 NOTE — Telephone Encounter (Signed)
New message     Request for surgical clearance:  1. What type of surgery is being performed? Shoulder repair   2. When is this surgery scheduled? 1.6.2017   3. Are there any medications that need to be held prior to surgery and how long? eliquis    4. Name of physician performing surgery? Dr. Veverly Fells   5. What is your office phone and fax number? Fax # [phone] / [phone]- phone

## 2015-06-24 NOTE — Telephone Encounter (Signed)
Patient has appointment with [facility] tomorrow, he was supposed to have scan done on his shoulder, but surgeon cancelled the scan, so he asked [facility] if he could keep that appointment and have his CT of his chest done instead.  They require an order from our office to do the CT.  Patient's PCP told them to have Dr. Juanetta Gosling office order the CT.  Patient is having shoulder replacement surgery on Friday and wife wants to have results before he goes into surgery on Friday to make sure there will not be any complications during surgery (i.e. PE issues).  Patient is requesting that Dr. Halford Chessman place order for CT so patient can have CT tomorrow.  Dr. Halford Chessman, ok for order?  Please advise.

## 2015-06-24 NOTE — Telephone Encounter (Signed)
Order entered STAT so PA can be done prior to patient's appointment tomorrow afternoon. Called and advised patient that order has been placed. Patient requesting that after CT has been read by Dr. Halford Chessman, it gets faxed to Dr. Lynnette Caffey at [facility] prior to patient's surgery on Friday morning.  FYI to Dr. Halford Chessman

## 2015-06-25 ENCOUNTER — Other Ambulatory Visit: Payer: PPO

## 2015-06-25 ENCOUNTER — Ambulatory Visit
Admission: RE | Admit: 2015-06-25 | Discharge: 2015-06-25 | Disposition: A | Discharge status: Home / Self Care | Payer: PPO | Source: Ambulatory Visit | Attending: Pulmonary Disease | Admitting: Pulmonary Disease

## 2015-06-25 ENCOUNTER — Inpatient Hospital Stay: Admission: RE | Admit: 2015-06-25 | Payer: PPO | Source: Ambulatory Visit

## 2015-06-25 DIAGNOSIS — R918 Other nonspecific abnormal finding of lung field: Secondary | ICD-10-CM | POA: Diagnosis not present

## 2015-06-25 DIAGNOSIS — R911 Solitary pulmonary nodule: Secondary | ICD-10-CM

## 2015-06-25 NOTE — Progress Notes (Signed)
Anesthesia Chart Review:  Pt is 82 year old male scheduled for R total shoulder arthroplasty vs reverse total shoulder on 06/27/2015 with Dr. Veverly Fells.   Cardiologist is Dr. Cristopher Peru, last office visit 10/08/14.  PMH includes:  PAF, SVT, bradycardia, pacemaker, HTN, hyperlipidemia, OSA (not on CPAP), stroke (2014), B carotid stenosis, GERD, prostate cancer, borderline DM, psoriatic arthritis. Former smoker. BMI 29. L TKA 12/20/11.   Medications include: eliquis, lipitor, zetia, lasix, atrovent, lisinopril, methotrexate, protonix, prednisone. Last dose of eliquis 06/22/15.   Preoperative labs reviewed.    EKG 12/12/14: Sinus rhythm with 1st degree A-V block. Nonspecific ST and T wave abnormality  Echo 10/10/14:  - Left ventricle: The cavity size was at the upper limits of normal. Wall thickness was normal. The estimated ejection fraction was 50%. Diffuse hypokinesis. Doppler parameters are consistent with abnormal left ventricular relaxation (grade 1 diastolic dysfunction). - Aortic valve: Trileaflet; moderately calcified leaflets. There was moderate stenosis. Mean gradient (S): 24 mm Hg. Valve area (VTI): 1.2 cm^2. - Mitral valve: There was mild regurgitation. - Left atrium: The atrium was mildly dilated. - Right ventricle: The cavity size was normal. Pacer wire or catheter noted in right ventricle. Systolic function was normal. - Tricuspid valve: Peak RV-RA gradient (S): 26 mm Hg. - Pulmonary arteries: PA peak pressure: 29 mm Hg (S). - Inferior vena cava: The vessel was normal in size. The respirophasic diameter changes were in the normal range (>= 50%), consistent with normal central venous pressure. - Impressions: Borderline dilated LV with EF 50%, mild diffuse hypokinesis.Normal RV size and systolic function. Moderate aortic stenosis.Mild MR.  Pt has cardiac clearance from Dr. Lovena Le (found in media tab).   If no changes, I anticipate pt can proceed with surgery as scheduled.   Willeen Cass, FNP-BC [facility]/Anesthesiology Phone: [phone] 06/25/2015 3:59 PM

## 2015-06-26 ENCOUNTER — Telehealth: Payer: Self-pay | Admitting: Pulmonary Disease

## 2015-06-26 MED ORDER — CHLORHEXIDINE GLUCONATE 4 % EX LIQD: 60.0000 mL | Freq: Once | CUTANEOUS | Status: DC

## 2015-06-26 MED ORDER — CEFAZOLIN SODIUM-DEXTROSE 2-3 GM-% IV SOLR
2.0000 g | INTRAVENOUS | Status: AC
  Administered 2015-06-27: 2 g via INTRAVENOUS
  Filled 2015-06-26: qty 50

## 2015-06-26 NOTE — Telephone Encounter (Signed)
CT chest 06/25/15 >> stable 5 mm nodule RLL since 2009, mild emphysema   Will have my nurse inform pt that CT chest did not show any changes to lung nodules, and these have been stable since 2009.  These are benign, and no additional radiographic follow up is needed for these.  Nothing seen on CT chest that would prohibit him from having surgery.

## 2015-06-26 NOTE — Telephone Encounter (Signed)
Pts wife is aware of results.

## 2015-06-27 ENCOUNTER — Encounter (HOSPITAL_COMMUNITY): Payer: Self-pay | Admitting: *Deleted

## 2015-06-27 ENCOUNTER — Inpatient Hospital Stay (HOSPITAL_COMMUNITY)
Admission: RE | Admit: 2015-06-27 | Discharge: 2015-06-29 | DRG: 483 | Disposition: A | Discharge status: Home / Self Care | Payer: PPO | Source: Ambulatory Visit | Attending: Orthopedic Surgery | Admitting: Orthopedic Surgery

## 2015-06-27 ENCOUNTER — Inpatient Hospital Stay (HOSPITAL_COMMUNITY): Payer: PPO | Admitting: Anesthesiology

## 2015-06-27 ENCOUNTER — Encounter (HOSPITAL_COMMUNITY)
Admission: RE | Disposition: A | Discharge status: Home / Self Care | Payer: Self-pay | Source: Ambulatory Visit | Attending: Orthopedic Surgery

## 2015-06-27 ENCOUNTER — Inpatient Hospital Stay (HOSPITAL_COMMUNITY): Payer: PPO | Admitting: Emergency Medicine

## 2015-06-27 ENCOUNTER — Inpatient Hospital Stay (HOSPITAL_COMMUNITY): Payer: PPO

## 2015-06-27 DIAGNOSIS — I10 Essential (primary) hypertension: Secondary | ICD-10-CM | POA: Diagnosis present

## 2015-06-27 DIAGNOSIS — Z7952 Long term (current) use of systemic steroids: Secondary | ICD-10-CM | POA: Diagnosis not present

## 2015-06-27 DIAGNOSIS — Z883 Allergy status to other anti-infective agents status: Secondary | ICD-10-CM

## 2015-06-27 DIAGNOSIS — Z886 Allergy status to analgesic agent status: Secondary | ICD-10-CM | POA: Diagnosis not present

## 2015-06-27 DIAGNOSIS — I350 Nonrheumatic aortic (valve) stenosis: Secondary | ICD-10-CM | POA: Diagnosis present

## 2015-06-27 DIAGNOSIS — G2581 Restless legs syndrome: Secondary | ICD-10-CM | POA: Diagnosis present

## 2015-06-27 DIAGNOSIS — Z7901 Long term (current) use of anticoagulants: Secondary | ICD-10-CM | POA: Diagnosis not present

## 2015-06-27 DIAGNOSIS — M19011 Primary osteoarthritis, right shoulder: Secondary | ICD-10-CM | POA: Diagnosis not present

## 2015-06-27 DIAGNOSIS — Z950 Presence of cardiac pacemaker: Secondary | ICD-10-CM

## 2015-06-27 DIAGNOSIS — I480 Paroxysmal atrial fibrillation: Secondary | ICD-10-CM | POA: Diagnosis present

## 2015-06-27 DIAGNOSIS — K219 Gastro-esophageal reflux disease without esophagitis: Secondary | ICD-10-CM | POA: Diagnosis present

## 2015-06-27 DIAGNOSIS — Z96611 Presence of right artificial shoulder joint: Secondary | ICD-10-CM

## 2015-06-27 DIAGNOSIS — I739 Peripheral vascular disease, unspecified: Secondary | ICD-10-CM | POA: Diagnosis not present

## 2015-06-27 DIAGNOSIS — Z96619 Presence of unspecified artificial shoulder joint: Secondary | ICD-10-CM

## 2015-06-27 DIAGNOSIS — Z8673 Personal history of transient ischemic attack (TIA), and cerebral infarction without residual deficits: Secondary | ICD-10-CM | POA: Diagnosis not present

## 2015-06-27 DIAGNOSIS — L409 Psoriasis, unspecified: Secondary | ICD-10-CM | POA: Diagnosis present

## 2015-06-27 DIAGNOSIS — E785 Hyperlipidemia, unspecified: Secondary | ICD-10-CM | POA: Diagnosis present

## 2015-06-27 DIAGNOSIS — R918 Other nonspecific abnormal finding of lung field: Secondary | ICD-10-CM | POA: Diagnosis not present

## 2015-06-27 DIAGNOSIS — Z87891 Personal history of nicotine dependence: Secondary | ICD-10-CM | POA: Diagnosis not present

## 2015-06-27 DIAGNOSIS — G4733 Obstructive sleep apnea (adult) (pediatric): Secondary | ICD-10-CM | POA: Diagnosis present

## 2015-06-27 DIAGNOSIS — R7303 Prediabetes: Secondary | ICD-10-CM | POA: Diagnosis not present

## 2015-06-27 DIAGNOSIS — Z8546 Personal history of malignant neoplasm of prostate: Secondary | ICD-10-CM | POA: Diagnosis not present

## 2015-06-27 DIAGNOSIS — Z471 Aftercare following joint replacement surgery: Secondary | ICD-10-CM | POA: Diagnosis not present

## 2015-06-27 DIAGNOSIS — G8918 Other acute postprocedural pain: Secondary | ICD-10-CM | POA: Diagnosis not present

## 2015-06-27 HISTORY — PX: TOTAL SHOULDER ARTHROPLASTY: SHX126

## 2015-06-27 SURGERY — ARTHROPLASTY, SHOULDER, TOTAL
Anesthesia: Regional | Site: Shoulder | Laterality: Right

## 2015-06-27 MED ORDER — ONDANSETRON HCL 4 MG PO TABS
4.0000 mg | ORAL_TABLET | Freq: Four times a day (QID) | ORAL | Status: DC | PRN
Start: 2015-06-27 — End: 2015-06-29

## 2015-06-27 MED ORDER — LIDOCAINE HCL (CARDIAC) 20 MG/ML IV SOLN
INTRAVENOUS | Status: DC | PRN
  Administered 2015-06-27: 40 mg via INTRAVENOUS

## 2015-06-27 MED ORDER — PROPOFOL 10 MG/ML IV BOLUS
INTRAVENOUS | Status: DC | PRN
  Administered 2015-06-27 (×2): 50 mg via INTRAVENOUS
  Administered 2015-06-27: 100 mg via INTRAVENOUS

## 2015-06-27 MED ORDER — HYDROCODONE-ACETAMINOPHEN 5-325 MG PO TABS: 1.0000 | ORAL_TABLET | Freq: Four times a day (QID) | ORAL | Status: DC | PRN

## 2015-06-27 MED ORDER — TRIAMCINOLONE ACETONIDE 0.1 % EX CREA
1.0000 "application " | TOPICAL_CREAM | Freq: Every day | CUTANEOUS | Status: DC | PRN
  Filled 2015-06-27: qty 15

## 2015-06-27 MED ORDER — PROPOFOL 10 MG/ML IV BOLUS
INTRAVENOUS | Status: AC
  Filled 2015-06-27: qty 20

## 2015-06-27 MED ORDER — METHOCARBAMOL 1000 MG/10ML IJ SOLN
500.0000 mg | Freq: Four times a day (QID) | INTRAVENOUS | Status: DC | PRN
  Filled 2015-06-27: qty 5

## 2015-06-27 MED ORDER — MORPHINE SULFATE (PF) 2 MG/ML IV SOLN: 2.0000 mg | INTRAVENOUS | Status: DC | PRN

## 2015-06-27 MED ORDER — DEXAMETHASONE SODIUM PHOSPHATE 4 MG/ML IJ SOLN
INTRAMUSCULAR | Status: AC
  Filled 2015-06-27: qty 1

## 2015-06-27 MED ORDER — ONDANSETRON HCL 4 MG/2ML IJ SOLN
INTRAMUSCULAR | Status: AC
  Filled 2015-06-27: qty 2

## 2015-06-27 MED ORDER — POLYETHYLENE GLYCOL 3350 17 G PO PACK: 17.0000 g | PACK | Freq: Every day | ORAL | Status: DC | PRN

## 2015-06-27 MED ORDER — CLOTRIMAZOLE 1 % EX CREA
TOPICAL_CREAM | Freq: Two times a day (BID) | CUTANEOUS | Status: DC
  Administered 2015-06-27 – 2015-06-29 (×3): via TOPICAL
  Filled 2015-06-27: qty 15

## 2015-06-27 MED ORDER — BUPIVACAINE-EPINEPHRINE (PF) 0.25% -1:200000 IJ SOLN
INTRAMUSCULAR | Status: AC
  Filled 2015-06-27: qty 30

## 2015-06-27 MED ORDER — LACTATED RINGERS IV SOLN
INTRAVENOUS | Status: DC
  Administered 2015-06-27: 13:00:00 via INTRAVENOUS

## 2015-06-27 MED ORDER — METHOTREXATE 2.5 MG PO TABS: 10.0000 mg | ORAL_TABLET | ORAL | Status: DC

## 2015-06-27 MED ORDER — MIDAZOLAM HCL 2 MG/2ML IJ SOLN
INTRAMUSCULAR | Status: AC
  Filled 2015-06-27: qty 2

## 2015-06-27 MED ORDER — FENTANYL CITRATE (PF) 250 MCG/5ML IJ SOLN
INTRAMUSCULAR | Status: AC
  Filled 2015-06-27: qty 5

## 2015-06-27 MED ORDER — ACETAMINOPHEN 650 MG RE SUPP: 650.0000 mg | Freq: Four times a day (QID) | RECTAL | Status: DC | PRN

## 2015-06-27 MED ORDER — EZETIMIBE 10 MG PO TABS
10.0000 mg | ORAL_TABLET | Freq: Every day | ORAL | Status: DC
  Administered 2015-06-28 – 2015-06-29 (×2): 10 mg via ORAL
  Filled 2015-06-27 (×2): qty 1

## 2015-06-27 MED ORDER — GLYCOPYRROLATE 0.2 MG/ML IJ SOLN
INTRAMUSCULAR | Status: AC
  Filled 2015-06-27: qty 2

## 2015-06-27 MED ORDER — ROPIVACAINE HCL 5 MG/ML IJ SOLN
INTRAMUSCULAR | Status: DC | PRN
  Administered 2015-06-27: 25 mL via PERINEURAL

## 2015-06-27 MED ORDER — DOCUSATE SODIUM 100 MG PO CAPS: 100.0000 mg | ORAL_CAPSULE | Freq: Two times a day (BID) | ORAL | Status: DC

## 2015-06-27 MED ORDER — ROPINIROLE HCL 1 MG PO TABS
2.0000 mg | ORAL_TABLET | Freq: Every evening | ORAL | Status: DC
  Administered 2015-06-27 – 2015-06-28 (×2): 2 mg via ORAL
  Filled 2015-06-27 (×2): qty 2

## 2015-06-27 MED ORDER — METHOCARBAMOL 500 MG PO TABS
500.0000 mg | ORAL_TABLET | Freq: Four times a day (QID) | ORAL | Status: DC | PRN
  Administered 2015-06-28 – 2015-06-29 (×4): 500 mg via ORAL
  Filled 2015-06-27 (×4): qty 1

## 2015-06-27 MED ORDER — CEFAZOLIN SODIUM-DEXTROSE 2-3 GM-% IV SOLR
2.0000 g | Freq: Four times a day (QID) | INTRAVENOUS | Status: AC
  Administered 2015-06-27 – 2015-06-28 (×3): 2 g via INTRAVENOUS
  Filled 2015-06-27 (×3): qty 50

## 2015-06-27 MED ORDER — TRAMADOL HCL 50 MG PO TABS
50.0000 mg | ORAL_TABLET | Freq: Four times a day (QID) | ORAL | Status: DC | PRN
  Administered 2015-06-27 – 2015-06-29 (×5): 100 mg via ORAL
  Filled 2015-06-27 (×5): qty 2

## 2015-06-27 MED ORDER — TRAMADOL HCL 50 MG PO TABS: 50.0000 mg | ORAL_TABLET | Freq: Four times a day (QID) | ORAL | Status: DC | PRN

## 2015-06-27 MED ORDER — METOCLOPRAMIDE HCL 5 MG PO TABS: 5.0000 mg | ORAL_TABLET | Freq: Three times a day (TID) | ORAL | Status: DC | PRN

## 2015-06-27 MED ORDER — NEOSTIGMINE METHYLSULFATE 10 MG/10ML IV SOLN
INTRAVENOUS | Status: DC | PRN
  Administered 2015-06-27: 3 mg via INTRAVENOUS

## 2015-06-27 MED ORDER — GLYCOPYRROLATE 0.2 MG/ML IJ SOLN
INTRAMUSCULAR | Status: DC | PRN
  Administered 2015-06-27: 0.4 mg via INTRAVENOUS

## 2015-06-27 MED ORDER — ACETAMINOPHEN 325 MG PO TABS
650.0000 mg | ORAL_TABLET | Freq: Four times a day (QID) | ORAL | Status: DC | PRN
  Administered 2015-06-28: 650 mg via ORAL
  Filled 2015-06-27: qty 2

## 2015-06-27 MED ORDER — BISACODYL 10 MG RE SUPP: 10.0000 mg | Freq: Every day | RECTAL | Status: DC | PRN

## 2015-06-27 MED ORDER — ROCURONIUM BROMIDE 100 MG/10ML IV SOLN
INTRAVENOUS | Status: DC | PRN
  Administered 2015-06-27: 10 mg via INTRAVENOUS
  Administered 2015-06-27: 30 mg via INTRAVENOUS

## 2015-06-27 MED ORDER — DOCUSATE SODIUM 100 MG PO CAPS
100.0000 mg | ORAL_CAPSULE | Freq: Two times a day (BID) | ORAL | Status: DC
  Administered 2015-06-27 – 2015-06-29 (×4): 100 mg via ORAL
  Filled 2015-06-27 (×4): qty 1

## 2015-06-27 MED ORDER — ATORVASTATIN CALCIUM 20 MG PO TABS
20.0000 mg | ORAL_TABLET | Freq: Every day | ORAL | Status: DC
  Administered 2015-06-27 – 2015-06-28 (×2): 20 mg via ORAL
  Filled 2015-06-27 (×2): qty 1

## 2015-06-27 MED ORDER — CITALOPRAM HYDROBROMIDE 20 MG PO TABS
30.0000 mg | ORAL_TABLET | Freq: Every day | ORAL | Status: DC
  Administered 2015-06-27 – 2015-06-29 (×3): 30 mg via ORAL
  Filled 2015-06-27 (×3): qty 1

## 2015-06-27 MED ORDER — HYDROMORPHONE HCL 1 MG/ML IJ SOLN: 0.2500 mg | INTRAMUSCULAR | Status: DC | PRN

## 2015-06-27 MED ORDER — IPRATROPIUM BROMIDE 0.06 % NA SOLN
2.0000 | Freq: Four times a day (QID) | NASAL | Status: DC | PRN
  Filled 2015-06-27: qty 15

## 2015-06-27 MED ORDER — PANTOPRAZOLE SODIUM 40 MG PO TBEC
40.0000 mg | DELAYED_RELEASE_TABLET | Freq: Every day | ORAL | Status: DC
  Administered 2015-06-28 – 2015-06-29 (×2): 40 mg via ORAL
  Filled 2015-06-27 (×2): qty 1

## 2015-06-27 MED ORDER — SODIUM CHLORIDE 0.9 % IR SOLN
Status: DC | PRN
  Administered 2015-06-27: 1000 mL

## 2015-06-27 MED ORDER — SODIUM CHLORIDE 0.9 % IV SOLN
INTRAVENOUS | Status: DC
Start: 2015-06-27 — End: 2015-06-29
  Administered 2015-06-27: 21:00:00 via INTRAVENOUS

## 2015-06-27 MED ORDER — METOCLOPRAMIDE HCL 5 MG/ML IJ SOLN: 5.0000 mg | Freq: Three times a day (TID) | INTRAMUSCULAR | Status: DC | PRN

## 2015-06-27 MED ORDER — ROCURONIUM BROMIDE 50 MG/5ML IV SOLN
INTRAVENOUS | Status: AC
  Filled 2015-06-27: qty 1

## 2015-06-27 MED ORDER — PHENOL 1.4 % MT LIQD
1.0000 | OROMUCOSAL | Status: DC | PRN
Start: 2015-06-27 — End: 2015-06-29

## 2015-06-27 MED ORDER — ONDANSETRON HCL 4 MG/2ML IJ SOLN
INTRAMUSCULAR | Status: DC | PRN
  Administered 2015-06-27: 4 mg via INTRAVENOUS

## 2015-06-27 MED ORDER — PREDNISONE 5 MG PO TABS
7.5000 mg | ORAL_TABLET | Freq: Every day | ORAL | Status: DC
  Administered 2015-06-28 – 2015-06-29 (×2): 7.5 mg via ORAL
  Filled 2015-06-27 (×2): qty 2

## 2015-06-27 MED ORDER — PHENYLEPHRINE HCL 10 MG/ML IJ SOLN
10.0000 mg | INTRAVENOUS | Status: DC | PRN
  Administered 2015-06-27: 75 ug/min via INTRAVENOUS

## 2015-06-27 MED ORDER — BUPIVACAINE-EPINEPHRINE 0.25% -1:200000 IJ SOLN
INTRAMUSCULAR | Status: DC | PRN
  Administered 2015-06-27: 9 mL

## 2015-06-27 MED ORDER — FENTANYL CITRATE (PF) 100 MCG/2ML IJ SOLN: 100.0000 ug | Freq: Once | INTRAMUSCULAR | Status: DC

## 2015-06-27 MED ORDER — FENTANYL CITRATE (PF) 100 MCG/2ML IJ SOLN
INTRAMUSCULAR | Status: DC | PRN
  Administered 2015-06-27 (×3): 50 ug via INTRAVENOUS

## 2015-06-27 MED ORDER — MENTHOL 3 MG MT LOZG: 1.0000 | LOZENGE | OROMUCOSAL | Status: DC | PRN

## 2015-06-27 MED ORDER — APIXABAN 5 MG PO TABS
5.0000 mg | ORAL_TABLET | Freq: Two times a day (BID) | ORAL | Status: DC
  Administered 2015-06-28 – 2015-06-29 (×3): 5 mg via ORAL
  Filled 2015-06-27 (×3): qty 1

## 2015-06-27 MED ORDER — FUROSEMIDE 40 MG PO TABS
40.0000 mg | ORAL_TABLET | Freq: Every day | ORAL | Status: DC
  Administered 2015-06-28 – 2015-06-29 (×2): 40 mg via ORAL
  Filled 2015-06-27 (×2): qty 1

## 2015-06-27 MED ORDER — APIXABAN 5 MG PO TABS: 5.0000 mg | ORAL_TABLET | Freq: Two times a day (BID) | ORAL | Status: DC

## 2015-06-27 MED ORDER — ONDANSETRON HCL 4 MG/2ML IJ SOLN: 4.0000 mg | Freq: Four times a day (QID) | INTRAMUSCULAR | Status: DC | PRN

## 2015-06-27 MED ORDER — METHOCARBAMOL 500 MG PO TABS: 500.0000 mg | ORAL_TABLET | Freq: Three times a day (TID) | ORAL | Status: DC | PRN

## 2015-06-27 MED ORDER — LISINOPRIL 10 MG PO TABS
10.0000 mg | ORAL_TABLET | Freq: Every day | ORAL | Status: DC
Start: 2015-06-28 — End: 2015-06-29
  Administered 2015-06-28 – 2015-06-29 (×2): 10 mg via ORAL
  Filled 2015-06-27 (×2): qty 1

## 2015-06-27 MED ORDER — FENTANYL CITRATE (PF) 100 MCG/2ML IJ SOLN
INTRAMUSCULAR | Status: AC
  Filled 2015-06-27: qty 2

## 2015-06-27 MED ORDER — MIRABEGRON ER 25 MG PO TB24
50.0000 mg | ORAL_TABLET | Freq: Every day | ORAL | Status: DC
  Administered 2015-06-27 – 2015-06-29 (×3): 50 mg via ORAL
  Filled 2015-06-27 (×3): qty 2

## 2015-06-27 SURGICAL SUPPLY — 82 items
BIT DRILL 170X2.5X (BIT) IMPLANT
BIT DRL 170X2.5X (BIT) ×1
BLADE SAW SAG 73X25 THK (BLADE) ×2
BLADE SAW SGTL 73X25 THK (BLADE) ×1 IMPLANT
BUR SURG 4X8 MED (BURR) IMPLANT
BURR SURG 4MMX8MM MEDIUM (BURR)
BURR SURG 4X8 MED (BURR)
CAPT SHLDR REVTOTAL 1 ×2 IMPLANT
CLOSURE STERI-STRIP 1/2X4 (GAUZE/BANDAGES/DRESSINGS) ×1
CLOSURE WOUND 1/2 X4 (GAUZE/BANDAGES/DRESSINGS)
CLSR STERI-STRIP ANTIMIC 1/2X4 (GAUZE/BANDAGES/DRESSINGS) ×1 IMPLANT
COVER SURGICAL LIGHT HANDLE (MISCELLANEOUS) ×3 IMPLANT
DRAPE IMP U-DRAPE 54X76 (DRAPES) ×3 IMPLANT
DRAPE INCISE IOBAN 66X45 STRL (DRAPES) ×9 IMPLANT
DRAPE ORTHO SPLIT 77X108 STRL (DRAPES) ×6
DRAPE SURG ORHT 6 SPLT 77X108 (DRAPES) IMPLANT
DRAPE U-SHAPE 47X51 STRL (DRAPES) ×3 IMPLANT
DRAPE X-RAY CASS 24X20 (DRAPES) IMPLANT
DRILL 2.5 (BIT) ×3
DRILL BIT 5/64 (BIT) ×3 IMPLANT
DRSG ADAPTIC 3X8 NADH LF (GAUZE/BANDAGES/DRESSINGS) ×3 IMPLANT
DRSG PAD ABDOMINAL 8X10 ST (GAUZE/BANDAGES/DRESSINGS) ×6 IMPLANT
DURAPREP 26ML APPLICATOR (WOUND CARE) ×3 IMPLANT
ELECT BLADE 4.0 EZ CLEAN MEGAD (MISCELLANEOUS) ×3
ELECT NDL TIP 2.8 STRL (NEEDLE) ×1 IMPLANT
ELECT NEEDLE TIP 2.8 STRL (NEEDLE) ×3 IMPLANT
ELECT REM PT RETURN 9FT ADLT (ELECTROSURGICAL) ×3
ELECTRODE BLDE 4.0 EZ CLN MEGD (MISCELLANEOUS) ×1 IMPLANT
ELECTRODE REM PT RTRN 9FT ADLT (ELECTROSURGICAL) ×1 IMPLANT
GAUZE SPONGE 4X4 12PLY STRL (GAUZE/BANDAGES/DRESSINGS) ×1 IMPLANT
GLOVE BIOGEL PI ORTHO PRO 7.5 (GLOVE) ×2
GLOVE BIOGEL PI ORTHO PRO SZ8 (GLOVE) ×2
GLOVE ORTHO TXT STRL SZ7.5 (GLOVE) ×3 IMPLANT
GLOVE PI ORTHO PRO STRL 7.5 (GLOVE) ×1 IMPLANT
GLOVE PI ORTHO PRO STRL SZ8 (GLOVE) ×1 IMPLANT
GLOVE SURG ORTHO 8.5 STRL (GLOVE) ×6 IMPLANT
GOWN STRL REUS W/ TWL XL LVL3 (GOWN DISPOSABLE) ×3 IMPLANT
GOWN STRL REUS W/TWL XL LVL3 (GOWN DISPOSABLE) ×9
HANDPIECE INTERPULSE COAX TIP (DISPOSABLE)
KIT BASIN OR (CUSTOM PROCEDURE TRAY) ×3 IMPLANT
KIT ROOM TURNOVER OR (KITS) ×3 IMPLANT
MANIFOLD NEPTUNE II (INSTRUMENTS) ×3 IMPLANT
NDL 1/2 CIR MAYO (NEEDLE) ×1 IMPLANT
NDL HYPO 25GX1X1/2 BEV (NEEDLE) ×1 IMPLANT
NDL SUT 6 .5 CRC .975X.05 MAYO (NEEDLE) ×1 IMPLANT
NEEDLE 1/2 CIR MAYO (NEEDLE) ×3 IMPLANT
NEEDLE HYPO 25GX1X1/2 BEV (NEEDLE) ×3 IMPLANT
NEEDLE MAYO TAPER (NEEDLE) ×3
NS IRRIG 1000ML POUR BTL (IV SOLUTION) ×3 IMPLANT
PACK SHOULDER (CUSTOM PROCEDURE TRAY) ×3 IMPLANT
PACK UNIVERSAL I (CUSTOM PROCEDURE TRAY) ×3 IMPLANT
PAD ARMBOARD 7.5X6 YLW CONV (MISCELLANEOUS) ×6 IMPLANT
PIN GUIDE 1.2 (PIN) ×2 IMPLANT
PIN GUIDE GLENOPHERE 1.5MX300M (PIN) ×2 IMPLANT
PIN METAGLENE 2.5 (PIN) ×4 IMPLANT
SET HNDPC FAN SPRY TIP SCT (DISPOSABLE) IMPLANT
SLING ARM IMMOBILIZER LRG (SOFTGOODS) ×3 IMPLANT
SLING ARM IMMOBILIZER MED (SOFTGOODS) IMPLANT
SMARTMIX MINI TOWER (MISCELLANEOUS)
SPONGE GAUZE 4X4 12PLY STER LF (GAUZE/BANDAGES/DRESSINGS) ×2 IMPLANT
SPONGE LAP 18X18 X RAY DECT (DISPOSABLE) ×3 IMPLANT
SPONGE LAP 4X18 X RAY DECT (DISPOSABLE) ×3 IMPLANT
SPONGE SURGIFOAM ABS GEL SZ50 (HEMOSTASIS) IMPLANT
STRIP CLOSURE SKIN 1/2X4 (GAUZE/BANDAGES/DRESSINGS) ×1 IMPLANT
SUCTION FRAZIER TIP 10 FR DISP (SUCTIONS) ×3 IMPLANT
SUT FIBERWIRE #2 38 T-5 BLUE (SUTURE) ×12
SUT MNCRL AB 4-0 PS2 18 (SUTURE) ×3 IMPLANT
SUT VIC AB 0 CT1 27 (SUTURE) ×3
SUT VIC AB 0 CT1 27XBRD ANBCTR (SUTURE) ×1 IMPLANT
SUT VIC AB 2-0 CT1 27 (SUTURE) ×3
SUT VIC AB 2-0 CT1 TAPERPNT 27 (SUTURE) ×1 IMPLANT
SUT VICRYL AB 2 0 TIES (SUTURE) ×3 IMPLANT
SUTURE FIBERWR #2 38 T-5 BLUE (SUTURE) ×2 IMPLANT
SYR CONTROL 10ML LL (SYRINGE) ×3 IMPLANT
TOWEL OR 17X24 6PK STRL BLUE (TOWEL DISPOSABLE) ×3 IMPLANT
TOWEL OR 17X26 10 PK STRL BLUE (TOWEL DISPOSABLE) ×3 IMPLANT
TOWER SMARTMIX MINI (MISCELLANEOUS) ×1 IMPLANT
TRAY FOLEY CATH 16FRSI W/METER (SET/KITS/TRAYS/PACK) ×1 IMPLANT
TUBE CONNECTING 12'X1/4 (SUCTIONS) ×1
TUBE CONNECTING 12X1/4 (SUCTIONS) ×1 IMPLANT
WATER STERILE IRR 1000ML POUR (IV SOLUTION) ×3 IMPLANT
YANKAUER SUCT BULB TIP NO VENT (SUCTIONS) ×4 IMPLANT

## 2015-06-27 NOTE — Anesthesia Preprocedure Evaluation (Addendum)
Anesthesia Evaluation  Patient identified by MRN, date of birth, ID band Patient awake    Reviewed: Allergy & Precautions, H&P , NPO status , Patient's Chart, lab work & pertinent test results  Airway Mallampati: I  TM Distance: >3 FB Neck ROM: Full    Dental no notable dental hx. (+) Teeth Intact, Dental Advisory Given   Pulmonary asthma , former smoker,    Pulmonary exam normal breath sounds clear to auscultation       Cardiovascular hypertension, Pt. on medications + Peripheral Vascular Disease  + dysrhythmias Atrial Fibrillation + pacemaker + Valvular Problems/Murmurs AS  Rhythm:Regular Rate:Normal     Neuro/Psych  Headaches, TIACVA, Residual Symptoms negative psych ROS   GI/Hepatic Neg liver ROS, GERD  Medicated and Controlled,  Endo/Other  negative endocrine ROS  Renal/GU negative Renal ROS  negative genitourinary   Musculoskeletal  (+) Arthritis , Osteoarthritis,    Abdominal   Peds  Hematology negative hematology ROS (+)   Anesthesia Other Findings   Reproductive/Obstetrics negative OB ROS                          Anesthesia Physical Anesthesia Plan  ASA: III  Anesthesia Plan: General and Regional   Post-op Pain Management: GA combined w/ Regional for post-op pain   Induction: Intravenous  Airway Management Planned: Oral ETT  Additional Equipment: Arterial line  Intra-op Plan:   Post-operative Plan: Extubation in OR  Informed Consent: I have reviewed the patients History and Physical, chart, labs and discussed the procedure including the risks, benefits and alternatives for the proposed anesthesia with the patient or authorized representative who has indicated his/her understanding and acceptance.   Dental advisory given  Plan Discussed with: CRNA  Anesthesia Plan Comments:        Anesthesia Quick Evaluation

## 2015-06-27 NOTE — Interval H&P Note (Signed)
History and Physical Interval Note:  06/27/2015 12:08 PM  Christopher Baker  has presented today for surgery, with the diagnosis of right shoulder end stage Osteoarthritis  The various methods of treatment have been discussed with the patient and family. After consideration of risks, benefits and other options for treatment, the patient has consented to  Procedure(s): RIGHT TOTAL SHOULDER ARTHROPLASTY VS REVERSE TOTAL SHOULDER (Right) as a surgical intervention .  The patient's history has been reviewed, patient examined, no change in status, stable for surgery.  I have reviewed the patient's chart and labs.  Questions were answered to the patient's satisfaction.     Calyx Hawker,STEVEN R

## 2015-06-27 NOTE — Anesthesia Procedure Notes (Addendum)
Anesthesia Regional Block:  Interscalene brachial plexus block  Pre-Anesthetic Checklist: ,, timeout performed, Correct Patient, Correct Site, Correct Laterality, Correct Procedure, Correct Position, site marked, Risks and benefits discussed,  Surgical consent,  Pre-op evaluation,  At surgeon's request and post-op pain management  Laterality: Right  Prep: chloraprep       Needles:  Injection technique: Single-shot  Needle Type: Echogenic Stimulator Needle     Needle Length: 9cm 9 cm Needle Gauge: 21 and 21 G    Additional Needles:  Procedures: ultrasound guided (picture in chart) and nerve stimulator Interscalene brachial plexus block  Nerve Stimulator or Paresthesia:  Response: deltoid, 0.5 mA,   Additional Responses:   Narrative:  Start time: 06/27/2015 12:40 PM End time: 06/27/2015 12:47 PM Injection made incrementally with aspirations every 5 mL.  Performed by: Personally  Anesthesiologist: Suzette Battiest   Procedure Name: Intubation Date/Time: 06/27/2015 1:09 PM Performed by: Kyung Rudd Pre-anesthesia Checklist: Patient identified, Emergency Drugs available, Suction available, Patient being monitored and Timeout performed Patient Re-evaluated:Patient Re-evaluated prior to inductionOxygen Delivery Method: Circle system utilized Preoxygenation: Pre-oxygenation with 100% oxygen Intubation Type: IV induction Ventilation: Mask ventilation without difficulty Laryngoscope Size: Mac and 4 Grade View: Grade I Tube type: Oral Tube size: 7.5 mm Number of attempts: 1 Airway Equipment and Method: Stylet Placement Confirmation: ETT inserted through vocal cords under direct vision,  positive ETCO2 and breath sounds checked- equal and bilateral Secured at: 22 cm Tube secured with: Tape Dental Injury: Teeth and Oropharynx as per pre-operative assessment

## 2015-06-27 NOTE — Transfer of Care (Signed)
Immediate Anesthesia Transfer of Care Note  Patient: Christopher Baker  Procedure(s) Performed: Procedure(s): REVERSE TOTAL SHOULDER ARTHROPLASTY (Right)  Patient Location: PACU  Anesthesia Type:GA combined with regional for post-op pain  Level of Consciousness: awake, alert  and oriented  Airway & Oxygen Therapy: Patient Spontanous Breathing and Patient connected to nasal cannula oxygen  Post-op Assessment: Report given to RN, Post -op Vital signs reviewed and stable and Patient moving all extremities  Post vital signs: Reviewed and stable  Last Vitals:  Filed Vitals:   06/27/15 1250 06/27/15 1255  BP:  147/69  Pulse: 60 68  Temp:    Resp: 13 14    Complications: No apparent anesthesia complications

## 2015-06-27 NOTE — Anesthesia Postprocedure Evaluation (Signed)
Anesthesia Post Note  Patient: Christopher Baker  Procedure(s) Performed: Procedure(s) (LRB): REVERSE TOTAL SHOULDER ARTHROPLASTY (Right)  Patient location during evaluation: PACU Anesthesia Type: General and Regional Level of consciousness: awake and alert Pain management: pain level controlled Vital Signs Assessment: post-procedure vital signs reviewed and stable Respiratory status: spontaneous breathing, nonlabored ventilation, respiratory function stable and patient connected to nasal cannula oxygen Cardiovascular status: blood pressure returned to baseline and stable Postop Assessment: no signs of nausea or vomiting Anesthetic complications: no    Last Vitals:  Filed Vitals:   06/27/15 1600 06/27/15 1611  BP:  105/54  Pulse: 65   Temp:  36.6 C  Resp: 18     Last Pain: There were no vitals filed for this visit.               Nicholis Stepanek,W. EDMOND

## 2015-06-27 NOTE — Brief Op Note (Signed)
06/27/2015  3:47 PM  PATIENT:  [name]  82 y.o. male  PRE-OPERATIVE DIAGNOSIS:  right shoulder end stage Osteoarthritis, rotator cuff insufficiency  POST-OPERATIVE DIAGNOSIS:  right shoulder end stage Osteoarthritis, rotator cuff insufficiency  PROCEDURE:  Procedure(s): REVERSE TOTAL SHOULDER ARTHROPLASTY (Right) De Puy Delta Xtend  SURGEON:  Surgeon(s) and Role:    * Netta Cedars, MD - Primary  PHYSICIAN ASSISTANT:   ASSISTANTS: Ventura Bruns, PA-C   ANESTHESIA:   regional and general  EBL:  Total I/O In: 750 [I.V.:750] Out: 200 [Blood:200]  BLOOD ADMINISTERED:none  DRAINS: none   LOCAL MEDICATIONS USED:  MARCAINE     SPECIMEN:  No Specimen  DISPOSITION OF SPECIMEN:  N/A  COUNTS:  YES  TOURNIQUET:  * No tourniquets in log *  DICTATION: .Other Dictation: Dictation Number [id number]  PLAN OF CARE: Admit to inpatient   PATIENT DISPOSITION:  PACU - hemodynamically stable.   Delay start of Pharmacological VTE agent (>24hrs) due to surgical blood loss or risk of bleeding: no

## 2015-06-27 NOTE — BH Specialist Note (Signed)
Christopher Baker with [employer] in to interrogate pacer, no new recommendations, Dr. Deatra Canter in for Dr. Cheri Kearns. [place] and made aware.

## 2015-06-28 LAB — BASIC METABOLIC PANEL
ANION GAP: 7 (ref 5–15)
BUN: 17 mg/dL (ref 6–20)
CALCIUM: 8.5 mg/dL — AB (ref 8.9–10.3)
CHLORIDE: 110 mmol/L (ref 101–111)
CO2: 24 mmol/L (ref 22–32)
Creatinine, Ser: 1.15 mg/dL (ref 0.61–1.24)
GFR calc non Af Amer: 59 mL/min — ABNORMAL LOW (ref >60)
Glucose, Bld: 125 mg/dL — ABNORMAL HIGH (ref 65–99)
POTASSIUM: 4.4 mmol/L (ref 3.5–5.1)
SODIUM: 141 mmol/L (ref 135–145)

## 2015-06-28 LAB — HEMOGLOBIN AND HEMATOCRIT, BLOOD
HCT: 31.3 % — ABNORMAL LOW (ref 39.0–52.0)
Hemoglobin: 9.7 g/dL — ABNORMAL LOW (ref 13.0–17.0)

## 2015-06-28 NOTE — Op Note (Signed)
NAMEJANICE, Christopher Baker               ACCOUNT NO.:  [id number]  MEDICAL RECORD NO.:  [id number]  LOCATION:  5N01C                        FACILITY:  [facility]  PHYSICIAN:  Christopher Baker, M.D. DATE OF BIRTH:  01/26/1934  DATE OF PROCEDURE:  06/27/2015 DATE OF DISCHARGE:                              OPERATIVE REPORT   PREOPERATIVE DIAGNOSIS:  Right shoulder end-stage arthritis and rotator cuff insufficiency.  POSTOPERATIVE DIAGNOSIS:  Right shoulder end-stage arthritis and rotator cuff insufficiency.  PROCEDURE PERFORMED:  Right reverse total shoulder arthroplasty, using DePuy Delta Xtend prosthesis.  ATTENDING SURGEON:  Christopher Baker, M.D.  ASSISTANT:  Charletta Cousin Dixon, [place], who scrubbed the entire procedure and necessary for satisfactory completion of surgery.  ANESTHESIA:  General anesthesia was used plus interscalene block.  ESTIMATED BLOOD LOSS:  Minimal.  FLUID REPLACEMENT:  1200 mL crystalloids.  INSTRUMENT COUNTS:  Correct.  COMPLICATIONS:  There were no complications.  ANTIBIOTICS:  Perioperative antibiotics were given.  INDICATIONS:  The patient is a 82 year old male, with a history of worsening right shoulder pain and dysfunction, secondary to combination of severe primary glenohumeral arthritis as well as a rotator cuff insufficiency.  The patient has had progressive pain, despite conservative management, desires operative treatment to restore function and eliminate pain.  Informed consent obtained.  DESCRIPTION OF PROCEDURE:  After an adequate level of anesthesia achieved, the patient was positioned in the modified beach-chair position.  Right shoulder correctly identified and sterilely prepped and draped in the usual manner.  Time-out was called.  We entered the patient's shoulder using a standard deltopectoral approach, starting at the coracoid process, extending down to the anterior humerus. Dissection down to the subcutaneous tissues using the Bovie.   We identified the cephalic vein, took it laterally to the deltoid, pectoralis taken medially, the upper centimeter of pectoralis was released.  Conjoined tendon was identified and retracted medially.  We released the subscapularis off the lesser tuberosity and tagged for repair at the end.  We then progressively released the inferior capsule off the medial inferior humeral neck, with progressive external rotation.  We then released some damaged and attenuated rotator cuff off the supraspinatus infraspinatus area and then used a Brown retractor to exposed the proximal humerus, which was delivered out of the wound.  We started the reamers, with a size 6 reamer and reamed up to a size 16 mm diameter for our stem and then we went ahead and did our metaphyseal reamer for the size 2 right set on 0 degrees and this was placed in 10 degrees of retroversion.  Once we had our humeral preparation complete, we placed our trial implant in, so a 16 body size 2 right set on 0, and placed in 10 degrees retroversion.  We impacted that in position, then subluxed the shoulder posteriorly and did a 360-degree capsular removal as well as glenoid labrum removal and biceps tenodesis and tenotomy, removed the intra-articular portion of the biceps.  We then identified the inferior starting point for our guide pin for a medical in preparation.  We were careful to place that metaglene guide at the inferior portion of the glenoid, with good bony support.  We drilled the guide  pin and reamed for the metaglene, drilled our central peg hole and impacted the metaglene into position.  We were able to get a 48 inferiorly, with good purchase and a 36 into the base of the coracoid. There was good purchase in 18 anteriorly and posteriorly.  We had great support and security of the metaglene.  We then screwed a 42 standard glenosphere into position, impacting that and screwing into position. The right axillary nerve was  protected during entire procedure and checked once we had the glenosphere in place to make sure it was free and clear.  Next, we went ahead and trailed with a 42+ 6 poly trial, which gave [place] excellent balance and soft tissue tension.  We had a negative sulcus, negative gapping with external rotation.  A nice tight conjoined.  We removed all the trial components on the humeral side.  We first drilled holes in the lesser tuberosity and placed #2 FiberWire suture for repair of the subscapularis.  We then went ahead and used impaction grafting technique to place our 16 HA-coated body with epi to right metaphyseal component, which was also HA-coated and that was again placed on the 0 setting for the implant and screwed into position and then inserted in 10 degrees retroversion.  Once that was impacted into place, with excellent security, we then went ahead and selected our 42+ 6 poly and impacted that on the humeral side and then reduced the shoulder with a nice snap and pleased with our soft tissue balancing, did not feel too tight.  It was nice and stable with  negative sulcus, negative gap with external rotation and then tight conjoined.  Axillary nerve was not under undue tension.  We thoroughly irrigated the shoulder and then repaired the subscapularis anatomically back to the lesser tuberosity to bone with #2 FiberWire.  We also then repaired the deltopectoral interval with #0 Vicryl suture followed by 2-0 Vicryl subcutaneous closure, and 4-0 Monocryl for skin.  Steri-Strips applied followed by sterile dressing.  The patient tolerated the surgery well.     Christopher Baker, M.D.     SRN/MEDQ  D:  06/27/2015  T:  06/28/2015  Job:  [id number]

## 2015-06-28 NOTE — Evaluation (Signed)
Occupational Therapy Evaluation Patient Details Name: Christopher Baker MRN: [id number] DOB: 1934/01/28 Today's Date: 06/28/2015    History of Present Illness s/p R reverse TSA. PMH: CVA with residual cognitive deficits, B TKA, back sx.   Clinical Impression   Pt was independent in ADL prior to admission.  Presents with decreased safety awareness and memory deficits, pain and R UE limitations.  Wife present for education (see ADL comments below). Will continue to follow acutely.    Follow Up Recommendations  Supervision/Assistance - 24 hour , progress to OP therapy with MD's guidance   Equipment Recommendations  None recommended by OT    Recommendations for Other Services       Precautions / Restrictions Precautions Precautions: Fall;Shoulder Type of Shoulder Precautions: conservative, may perform lap slides, hand to face and gentle rotation using L hand to assist R Shoulder Interventions: Shoulder sling/immobilizer;Off for dressing/bathing/exercises Precaution Booklet Issued: Yes (comment) Precaution Comments: pt attempting to get OOB with SCDs applied and IV upon OTs entry, RN informed of need for alarm when wife is not in room Required Braces or Orthoses: Sling Restrictions Weight Bearing Restrictions: Yes (NWB R UE)      Mobility Bed Mobility               General bed mobility comments: pt seated at EOB upon arrival, educated pt to get OOB to L side to avoid weight bearing on R UE  Transfers Overall transfer level: Needs assistance Equipment used: None Transfers: Sit to/from Stand Sit to Stand: Supervision              Balance                                            ADL Overall ADL's : Needs assistance/impaired Eating/Feeding: Set up;Sitting   Grooming: Wash/dry hands;Standing;Supervision/safety   Upper Body Bathing: Moderate assistance;Sitting   Lower Body Bathing: Minimal assistance;Sit to/from stand   Upper Body Dressing :  Minimal assistance;Sitting   Lower Body Dressing: Moderate assistance;Sit to/from stand   Toilet Transfer: Supervision/safety;Ambulation;Comfort height toilet   Toileting- Clothing Manipulation and Hygiene: Supervision/safety;Sit to/from stand       Functional mobility during ADLs: Supervision/safety General ADL Comments: Educated pt and wife in compensatory strategies for bathing and dressing. Instructed in donning and doffing sling, positiong of R UE in bed and chair.     Vision     Perception     Praxis      Pertinent Vitals/Pain Pain Assessment: 0-10 Pain Score: 6  Pain Location: R shoulder Pain Descriptors / Indicators: Throbbing Pain Intervention(s): Limited activity within patient's tolerance;Monitored during session;Repositioned;Ice applied;Patient requesting pain meds-RN notified;RN gave pain meds during session     Hand Dominance Right   Extremity/Trunk Assessment Upper Extremity Assessment Upper Extremity Assessment: RUE deficits/detail RUE Deficits / Details: full AROM elbow to hand RUE: Unable to fully assess due to immobilization;Unable to fully assess due to pain RUE Coordination: decreased gross motor   Lower Extremity Assessment Lower Extremity Assessment: Overall WFL for tasks assessed       Communication Communication Communication: No difficulties   Cognition Arousal/Alertness: Awake/alert Behavior During Therapy: WFL for tasks assessed/performed Overall Cognitive Status: History of cognitive impairments - at baseline       Memory: Decreased short-term memory             General Comments  Exercises       Shoulder Instructions      Home Living Family/patient expects to be discharged to:: Private residence Living Arrangements: Spouse/significant other Available Help at Discharge: Family;Available 24 hours/day Type of Home: House             Bathroom Shower/Tub: Walk-in Psychologist, prison and probation services: Standard     Home  Equipment: Environmental consultant - 2 wheels;Shower seat;Bedside commode          Prior Functioning/Environment Level of Independence: Independent        Comments: pt enjoys playing golf, drives short distances    OT Diagnosis: Generalized weakness;Acute pain;Cognitive deficits   OT Problem List: Decreased strength;Decreased activity tolerance;Decreased coordination;Decreased cognition;Decreased safety awareness;Decreased knowledge of precautions;Impaired UE functional use;Pain;Decreased range of motion   OT Treatment/Interventions: Self-care/ADL training;Therapeutic exercise;Patient/family education;Therapeutic activities    OT Goals(Current goals can be found in the care plan section) Acute Rehab OT Goals Patient Stated Goal: regain use of R UE OT Goal Formulation: With patient Time For Goal Achievement: 07/05/15 Potential to Achieve Goals: Good ADL Goals Pt Will Perform Upper Body Bathing: with min assist;sitting;with caregiver independent in assisting Pt Will Perform Lower Body Dressing: with min assist;with caregiver independent in assisting;sit to/from stand Pt/caregiver will Perform Home Exercise Program: Increased ROM;Right Upper extremity (lap slides, AAROM hand to mouth/rotation, AROM elbow to hand) Additional ADL Goal #1: Pt and wife will don and doff sling independently. Additional ADL Goal #2: Pt and wife will be knowledgeable in positioning R UE in bed and chair.  OT Frequency: Min 2X/week   Barriers to D/C:            Co-evaluation              End of Session Equipment Utilized During Treatment: Gait belt Nurse Communication: Mobility status;Patient requests pain meds (need for chair and bed alarms)  Activity Tolerance: Patient tolerated treatment well Patient left: in chair;with call bell/phone within reach;with nursing/sitter in room;with family/visitor present   Time: 1021-1100 OT Time Calculation (min): 39 min Charges:  OT General Charges $OT Visit: 1  Procedure OT Evaluation $OT Eval Moderate Complexity: 1 Procedure OT Treatments $Self Care/Home Management : 8-22 mins $Therapeutic Exercise: 8-22 mins G-Codes:    Malka So 06/28/2015, 11:16 AM  [phone]

## 2015-06-28 NOTE — Progress Notes (Signed)
PT Cancellation/DC Note  Patient Details Name: Christopher Baker MRN: [id number] DOB: June 05, 1934   Cancelled Treatment:    Reason Eval/Treat Not Completed: Other (comment) (Pt screened by Nestor Lewandowsky, OT and reports no PT needs )  Discussed pt with OT and she reports the pt is at baseline function and does not have any PT needs at this time.  OT to follow for shoulder care and ADLs.     Melvern Banker 06/28/2015, 1:17 PM Lavonia Dana, [place] 06/28/2015

## 2015-06-28 NOTE — Care Management Utilization Note (Signed)
Utilization review completed. Bular Hickok, RN Case Manager [phone]. 

## 2015-06-28 NOTE — Progress Notes (Signed)
   Subjective: 1 Day Post-Op Procedure(s) (LRB): REVERSE TOTAL SHOULDER ARTHROPLASTY (Right)  C/o moderate pain this morning to right shoulder Sling in place Denies any numbness or tingling distally Otherwise doing fair Patient reports pain as moderate.  Objective:   VITALS:   Filed Vitals:   06/28/15 0008 06/28/15 0514  BP: 126/49 137/57  Pulse: 74 79  Temp: 100.1 F (37.8 C) 98.8 F (37.1 C)  Resp: 18 18    Right shoulder incision healing well nv intact distally No rashes or edema Sling in place  LABS  Recent Labs  06/28/15 0423  HGB 9.7*  HCT 31.3*     Recent Labs  06/28/15 0423  NA 141  K 4.4  BUN 17  CREATININE 1.15  GLUCOSE 125*     Assessment/Plan: 1 Day Post-Op Procedure(s) (LRB): REVERSE TOTAL SHOULDER ARTHROPLASTY (Right) PT/OT Pain management Plan for d/c home tomorrow after therapy F/u in 2 weeks    Merla Riches, MPAS, PA-C  06/28/2015, 7:49 AM

## 2015-06-29 NOTE — Discharge Instructions (Signed)
°  Ice to the shoulder.  Ok to remove the sling in the house.  Must be worn out of the house.  Hug a pillow in the house.  It is better to sleep upright in a recliner or propped up in bed or on a sofa.  Keep the shoulder incision covered and clean and dry for one week, then ok to get it wet in the shower.  Do exercises every hour while awake, lap slides, gentle rotation exercises, ok moving the hand to the face  Follow up with Dr Veverly Fells in two weeks  [phone]

## 2015-06-29 NOTE — Progress Notes (Signed)
Subjective: 2 Days Post-Op Procedure(s) (LRB): REVERSE TOTAL SHOULDER ARTHROPLASTY (Right) Patient reports pain as moderate.  Tolerating medications and Po's. Progressing with PT. Patient and wife both report they are ready for D/C home. Denies any sOB, CP, or F/C.   Objective: Vital signs in last 24 hours: Temp:  [97.7 F (36.5 C)-99.7 F (37.6 C)] 97.7 F (36.5 C) (01/08 0541) Pulse Rate:  [77] 77 (01/08 0541) Resp:  [18] 18 (01/08 0541) BP: (150-166)/(56-71) 150/71 mmHg (01/08 0541) SpO2:  [93 %-94 %] 93 % (01/08 0541)  Intake/Output from previous day:   Intake/Output this shift: Total I/O In: 240 [P.O.:240] Out: -    Recent Labs  06/28/15 0423  HGB 9.7*    Recent Labs  06/28/15 0423  HCT 31.3*    Recent Labs  06/28/15 0423  NA 141  K 4.4  CL 110  CO2 24  BUN 17  CREATININE 1.15  GLUCOSE 125*  CALCIUM 8.5*   No results for input(s): LABPT, INR in the last 72 hours.  Well nourished. Alert and oriented x3. RRR, Lungs clear, BS x4. Abdomen soft and non tender.Right shoulder dressing C/D/I. No DVT signs. Compartment soft. No signs of infection.  Right UE grossly neurovascular intact.  Assessment/Plan: 2 Days Post-Op Procedure(s) (LRB): REVERSE TOTAL SHOULDER ARTHROPLASTY (Right) PT Plan to D/c home with family F/u with Dr. Veverly Fells in office Follow instructions Use sling as directed Take medications as directed Cautioned about poor weather conditions and Safety. STILWELL, BRYSON L 06/29/2015, 11:16 AM

## 2015-06-29 NOTE — Progress Notes (Signed)
Orthopedic Tech Progress Note Patient Details:  Christopher Baker [birth year] [id number]  Ortho Devices Type of Ortho Device: Sling immobilizer Ortho Device/Splint Location: rue Ortho Device/Splint Interventions: Application   Epic Tribbett 06/29/2015, 1:50 PM

## 2015-06-29 NOTE — Progress Notes (Signed)
Patient discharged home with wife. Prescriptions given. Education given on the administration and time interval of the pain medication. Regular shoulder sling replaced with shoulder sling with waist strap per recommendation of occupational therapist. No complaints of pain before discharge.

## 2015-06-29 NOTE — Progress Notes (Signed)
Occupational Therapy Treatment Patient Details Name: Christopher Baker MRN: [id number] DOB: [birth year] Today's Date: 06/29/2015    History of present illness s/p R reverse TSA. PMH: CVA with residual cognitive deficits, B TKA, back sx.   OT comments  Pt progressing towards acute OT goals. Focus of session was review of exercises, sling management, and ADL education. Pt needing max multimodal cues at times during exercises for correct technique. Spouse present and included in education as well. Session details below. Pt would benefit from waist strap for sling especially to facilitate proper positioning when coming to a standing position. Nursing notified. OT to continue to follow acutely.   Follow Up Recommendations  Supervision/Assistance - 24 hour    Equipment Recommendations  None recommended by OT    Recommendations for Other Services      Precautions / Restrictions Precautions Precautions: Fall;Shoulder Type of Shoulder Precautions: conservative, may perform lap slides, hand to face and gentle rotation using L hand to assist R Shoulder Interventions: Shoulder sling/immobilizer;Off for dressing/bathing/exercises Precaution Booklet Issued: Yes (comment) Precaution Comments: pt attempting to get OOB with SCDs applied and IV upon OTs entry, RN informed of need for alarm when wife is not in room Required Braces or Orthoses: Sling Restrictions Weight Bearing Restrictions: Yes       Mobility Bed Mobility               General bed mobility comments: in recliner  Transfers Overall transfer level: Needs assistance Equipment used: None Transfers: Sit to/from Stand Sit to Stand: Min assist         General transfer comment: min A needed to stand from recliner. Educated cg on how to facilitate sit<>stands at home. Pt would benefit from waist strap on sling to maintain position of RUE during standing.     Balance Overall balance assessment: Needs assistance Sitting-balance  support: Feet supported Sitting balance-Leahy Scale: Good     Standing balance support: Single extremity supported;No upper extremity supported Standing balance-Leahy Scale: Fair Standing balance comment: intermittent LUE support at times.                    ADL Overall ADL's : Needs assistance/impaired                     Lower Body Dressing: Moderate assistance;Sit to/from stand Lower Body Dressing Details (indicate cue type and reason): assist to don and min assist to power up to standing             Functional mobility during ADLs: Supervision/safety General ADL Comments: Pt with RUE propped onto arm rest upon therapist arrival. Educated pt on not resting arm on arm rest due to NWB status, repositioned. Reviewed UB dressing technique and recommendation for button up shirt is possible. Pt needing min A to stand from recliner. Educated on boosting height of seats with pillows and educated cg on how to assist pt safey with standing. ADL education reviewed with spouse present. Spouse assisting 24 hours at d/c. Pt completed in-room ambulation at min guard level some unsteadiness noted. Encouraged to use furniture surfaces on left side to assist with balance as needed at home.       Vision                     Perception     Praxis      Cognition   Behavior During Therapy: [facility] for tasks assessed/performed Overall Cognitive Status: History of cognitive impairments -  at baseline       Memory: Decreased short-term memory;Decreased recall of precautions               Extremity/Trunk Assessment               Exercises Other Exercises Other Exercises: Pt's spouse reports she recently went through exercises with pt. Pt completing return demonstration of exercises with max multimodal cueing needed at times for technique.    Shoulder Instructions       General Comments      Pertinent Vitals/ Pain       Pain Assessment: Faces Faces Pain  Scale: Hurts little more Pain Location: "it's ok, not too too bad" Pain Intervention(s): Monitored during session;Limited activity within patient's tolerance;Repositioned  Home Living                                          Prior Functioning/Environment              Frequency Min 2X/week     Progress Toward Goals  OT Goals(current goals can now be found in the care plan section)  Progress towards OT goals: Progressing toward goals  Acute Rehab OT Goals Patient Stated Goal: regain use of R UE OT Goal Formulation: With patient Time For Goal Achievement: 07/05/15 Potential to Achieve Goals: Good ADL Goals Pt Will Perform Upper Body Bathing: with min assist;sitting;with caregiver independent in assisting Pt Will Perform Lower Body Dressing: with min assist;with caregiver independent in assisting;sit to/from stand Pt/caregiver will Perform Home Exercise Program: Increased ROM;Right Upper extremity Additional ADL Goal #1: Pt and wife will don and doff sling independently. Additional ADL Goal #2: Pt and wife will be knowledgeable in positioning R UE in bed and chair.  Plan Discharge plan remains appropriate    Co-evaluation                 End of Session Equipment Utilized During Treatment: Other (comment) (sling)   Activity Tolerance Patient tolerated treatment well   Patient Left in chair;with call bell/phone within reach;with family/visitor present   Nurse Communication Other (comment) (needs waist strap for sling, nurse to call [facility])        Time: 1233-1310 OT Time Calculation (min): 37 min  Charges: OT General Charges $OT Visit: 1 Procedure OT Treatments $Self Care/Home Management : 8-22 mins $Therapeutic Exercise: 8-22 mins  Hortencia Pilar 06/29/2015, 1:26 PM

## 2015-06-30 ENCOUNTER — Other Ambulatory Visit: Payer: PPO

## 2015-07-01 ENCOUNTER — Encounter (HOSPITAL_COMMUNITY): Payer: Self-pay | Admitting: Orthopedic Surgery

## 2015-07-01 NOTE — Discharge Summary (Signed)
Physician Discharge Summary  Patient ID: Christopher Baker MRN: [id number] DOB/AGE: Feb 10, 1934 82 y.o.  Admit date: 06/27/2015 Discharge date: 07/01/2015  Admission Diagnoses: shoulder OA  Discharge Diagnoses:  Active Problems:   S/P shoulder replacement   Discharged Condition: Good  Hospital Course:  Christopher Baker is a 82 y.o. who was admitted to [facility]. They were brought to the operating room on 06/27/2015 and underwent Procedure(s): REVERSE TOTAL SHOULDER ARTHROPLASTY.  Patient tolerated the procedure well and was later transferred to the recovery room and then to the orthopaedic floor for postoperative care.  They were given PO and IV analgesics for pain control following their surgery.  They were given 24 hours of postoperative antibiotics of  Anti-infectives    Start     Dose/Rate Route Frequency Ordered Stop   06/27/15 1900  ceFAZolin (ANCEF) IVPB 2 g/50 mL premix     2 g 100 mL/hr over 30 Minutes Intravenous Every 6 hours 06/27/15 1645 06/28/15 0830   06/27/15 0900  ceFAZolin (ANCEF) IVPB 2 g/50 mL premix     2 g 100 mL/hr over 30 Minutes Intravenous To [facility]Stay [facility] 06/26/15 1310 06/27/15 1320    .   PT and OT were ordered for total joint protocol.  Discharge planning consulted to help with postop disposition and equipment needs.  Patient had a good night on the evening of surgery and started to get up OOB with therapy on day one.   Continued to work with therapy into day two.  Dressing was with normal limits.  The patient had progressed with therapy and meeting their goals. Patient was seen in rounds and was ready to go home.  Consults:n/a  Significant Diagnostic Studies: routine  Treatments: routine  Discharge Exam: Blood pressure 150/71, pulse 77, temperature 97.7 F (36.5 C), temperature source Oral, resp. rate 18, weight 104.801 kg (231 lb 0.7 oz), SpO2 93 %.  Well nourished. Alert and oriented x3. RRR, Lungs clear, BS x4. Abdomen soft and non tender.  Right shoulder dressing C/D/I. No DVT signs. Compartment soft. No signs of infection.  Right UE grossly neurovascular intact.  Disposition: 01-Home or Self Care     Medication List    TAKE these medications        apixaban 5 MG Tabs tablet  Commonly known as:  ELIQUIS  Take 1 tablet (5 mg total) by mouth 2 (two) times daily.     atorvastatin 20 MG tablet  Commonly known as:  LIPITOR  Take 20 mg by mouth daily.     citalopram 20 MG tablet  Commonly known as:  CELEXA  Take 1.5 tablets (30 mg total) by mouth daily.     clotrimazole-betamethasone cream  Commonly known as:  LOTRISONE  Apply 1 application topically 2 (two) times daily as needed (psoriasis).     cyanocobalamin 1000 MCG/ML injection  Commonly known as:  (VITAMIN B-12)  Inject 66mL twice a month     docusate sodium 100 MG capsule  Commonly known as:  COLACE  Take 100 mg by mouth as needed. Every other day     ezetimibe 10 MG tablet  Commonly known as:  ZETIA  Take 1 tablet (10 mg total) by mouth daily.     furosemide 20 MG tablet  Commonly known as:  LASIX  Take 2 tablets (40 mg total) by mouth 2 (two) times daily.     ipratropium 0.06 % nasal spray  Commonly known as:  ATROVENT  Place 2 sprays into  both nostrils 4 (four) times daily.     lisinopril 10 MG tablet  Commonly known as:  PRINIVIL,ZESTRIL  Take 1 tablet (10 mg total) by mouth daily.     methocarbamol 500 MG tablet  Commonly known as:  ROBAXIN  Take 1 tablet (500 mg total) by mouth 3 (three) times daily as needed.     methotrexate 2.5 MG tablet  Commonly known as:  RHEUMATREX  Take 4 tablets (10 mg total) by mouth once a week. Caution:Chemotherapy. Protect from light. Every monday     mirabegron ER 50 MG Tb24 tablet  Commonly known as:  MYRBETRIQ  Take 1 tablet (50 mg total) by mouth daily.     pantoprazole 40 MG tablet  Commonly known as:  PROTONIX  Take 1 tablet (40 mg total) by mouth daily.     predniSONE 5 MG tablet  Commonly  known as:  DELTASONE  Take 1.5 tablets (7.5 mg total) by mouth daily.     rOPINIRole 2 MG tablet  Commonly known as:  REQUIP  TAKE 1 TABLET (2 MG TOTAL) BY MOUTH 2 (TWO) TIMES DAILY.     SYRINGE/NEEDLE (DISP) 1 ML 25G X 5/8" 1 ML Misc  Use to inject 72ml of B-12 every two weeks     traMADol 50 MG tablet  Commonly known as:  ULTRAM  Take 1-2 tablets (50-100 mg total) by mouth every 6 (six) hours as needed.     triamcinolone cream 0.1 %  Commonly known as:  KENALOG  Apply to affected area as directed         Signed: Magnolia Mattila L 07/01/2015, 1:01 PM

## 2015-07-02 ENCOUNTER — Other Ambulatory Visit (HOSPITAL_COMMUNITY): Payer: Self-pay | Admitting: Internal Medicine

## 2015-07-02 DIAGNOSIS — R918 Other nonspecific abnormal finding of lung field: Secondary | ICD-10-CM

## 2015-07-03 ENCOUNTER — Other Ambulatory Visit (HOSPITAL_COMMUNITY): Payer: Self-pay | Admitting: Orthopedic Surgery

## 2015-07-03 ENCOUNTER — Ambulatory Visit (HOSPITAL_COMMUNITY)
Admission: RE | Admit: 2015-07-03 | Discharge: 2015-07-03 | Disposition: A | Discharge status: Home / Self Care | Payer: PPO | Source: Ambulatory Visit | Attending: Cardiology | Admitting: Cardiology

## 2015-07-03 DIAGNOSIS — Z96611 Presence of right artificial shoulder joint: Secondary | ICD-10-CM | POA: Diagnosis not present

## 2015-07-03 DIAGNOSIS — M7989 Other specified soft tissue disorders: Secondary | ICD-10-CM | POA: Insufficient documentation

## 2015-07-03 DIAGNOSIS — I10 Essential (primary) hypertension: Secondary | ICD-10-CM | POA: Diagnosis not present

## 2015-07-03 DIAGNOSIS — E785 Hyperlipidemia, unspecified: Secondary | ICD-10-CM | POA: Insufficient documentation

## 2015-07-03 DIAGNOSIS — M79601 Pain in right arm: Secondary | ICD-10-CM | POA: Diagnosis not present

## 2015-07-07 DIAGNOSIS — G4733 Obstructive sleep apnea (adult) (pediatric): Secondary | ICD-10-CM | POA: Diagnosis not present

## 2015-07-08 ENCOUNTER — Ambulatory Visit (HOSPITAL_COMMUNITY): Admission: RE | Admit: 2015-07-08 | Payer: PPO | Source: Ambulatory Visit

## 2015-07-09 ENCOUNTER — Encounter: Payer: Self-pay | Admitting: Family Medicine

## 2015-07-10 ENCOUNTER — Telehealth: Payer: Self-pay | Admitting: Internal Medicine

## 2015-07-10 DIAGNOSIS — Z96611 Presence of right artificial shoulder joint: Secondary | ICD-10-CM | POA: Diagnosis not present

## 2015-07-10 DIAGNOSIS — Z471 Aftercare following joint replacement surgery: Secondary | ICD-10-CM | POA: Diagnosis not present

## 2015-07-10 DIAGNOSIS — M19011 Primary osteoarthritis, right shoulder: Secondary | ICD-10-CM | POA: Diagnosis not present

## 2015-07-10 NOTE — Telephone Encounter (Signed)
NEw Message  Called to speak w/ pt wife about sched device chk- pt wife stated that it was checked when he was in the hosp for surgery- she is following up on if our office received the pacer check. Please call back and discuss.

## 2015-07-10 NOTE — Telephone Encounter (Signed)
Left message on patient's answering machine that we do not typically get checks done at the hospital pre or post surgery.  I said I would have Pamala Hurry call tomorrow to walk them through a remote transmission from home or we could have her set him up in the device clinic for a check.  He is due to see Dr Lovena Le in April.

## 2015-07-11 NOTE — Telephone Encounter (Signed)
Spoke w/ pt wife and informed her that pt insurance should pay for a remote transmission since pt device was interrogated at the hospital 2 weeks ago. Pt wife stated that she was unable to hook up home monitor at the current time so she agreed to take the tech service number and call them later to set up the home monitor.

## 2015-07-15 DIAGNOSIS — Z471 Aftercare following joint replacement surgery: Secondary | ICD-10-CM | POA: Diagnosis not present

## 2015-07-15 DIAGNOSIS — M19011 Primary osteoarthritis, right shoulder: Secondary | ICD-10-CM | POA: Diagnosis not present

## 2015-07-15 DIAGNOSIS — Z96611 Presence of right artificial shoulder joint: Secondary | ICD-10-CM | POA: Diagnosis not present

## 2015-07-16 DIAGNOSIS — M25611 Stiffness of right shoulder, not elsewhere classified: Secondary | ICD-10-CM | POA: Diagnosis not present

## 2015-07-18 DIAGNOSIS — M25611 Stiffness of right shoulder, not elsewhere classified: Secondary | ICD-10-CM | POA: Diagnosis not present

## 2015-07-21 DIAGNOSIS — M25611 Stiffness of right shoulder, not elsewhere classified: Secondary | ICD-10-CM | POA: Diagnosis not present

## 2015-07-22 DIAGNOSIS — Z8546 Personal history of malignant neoplasm of prostate: Secondary | ICD-10-CM | POA: Diagnosis not present

## 2015-07-22 DIAGNOSIS — N3040 Irradiation cystitis without hematuria: Secondary | ICD-10-CM | POA: Diagnosis not present

## 2015-07-22 DIAGNOSIS — F0280 Dementia in other diseases classified elsewhere without behavioral disturbance: Secondary | ICD-10-CM | POA: Diagnosis not present

## 2015-07-23 ENCOUNTER — Telehealth: Payer: Self-pay | Admitting: Internal Medicine

## 2015-07-23 NOTE — Telephone Encounter (Signed)
Pt is calling from his therapist cell phone. He is at therapy and had some problems. The therapist wanted him tocall and see if it is all right for him to do therapy today.

## 2015-07-23 NOTE — Telephone Encounter (Signed)
LMOM to return call to device clinic to discuss therapy session and problems today.

## 2015-07-28 DIAGNOSIS — M5136 Other intervertebral disc degeneration, lumbar region: Secondary | ICD-10-CM | POA: Diagnosis not present

## 2015-07-28 DIAGNOSIS — M545 Low back pain: Secondary | ICD-10-CM | POA: Diagnosis not present

## 2015-07-28 DIAGNOSIS — L4059 Other psoriatic arthropathy: Secondary | ICD-10-CM | POA: Diagnosis not present

## 2015-07-28 DIAGNOSIS — M25611 Stiffness of right shoulder, not elsewhere classified: Secondary | ICD-10-CM | POA: Diagnosis not present

## 2015-07-28 DIAGNOSIS — M2550 Pain in unspecified joint: Secondary | ICD-10-CM | POA: Diagnosis not present

## 2015-07-30 ENCOUNTER — Encounter: Payer: Self-pay | Admitting: Neurology

## 2015-07-30 DIAGNOSIS — M25611 Stiffness of right shoulder, not elsewhere classified: Secondary | ICD-10-CM | POA: Diagnosis not present

## 2015-08-01 ENCOUNTER — Encounter: Payer: Self-pay | Admitting: Cardiology

## 2015-08-04 DIAGNOSIS — M25611 Stiffness of right shoulder, not elsewhere classified: Secondary | ICD-10-CM | POA: Diagnosis not present

## 2015-08-06 DIAGNOSIS — M25611 Stiffness of right shoulder, not elsewhere classified: Secondary | ICD-10-CM | POA: Diagnosis not present

## 2015-08-07 ENCOUNTER — Other Ambulatory Visit: Payer: Self-pay | Admitting: Family

## 2015-08-07 DIAGNOSIS — G4733 Obstructive sleep apnea (adult) (pediatric): Secondary | ICD-10-CM | POA: Diagnosis not present

## 2015-08-11 DIAGNOSIS — M5136 Other intervertebral disc degeneration, lumbar region: Secondary | ICD-10-CM | POA: Diagnosis not present

## 2015-08-12 DIAGNOSIS — M19011 Primary osteoarthritis, right shoulder: Secondary | ICD-10-CM | POA: Diagnosis not present

## 2015-08-12 DIAGNOSIS — Z96611 Presence of right artificial shoulder joint: Secondary | ICD-10-CM | POA: Diagnosis not present

## 2015-08-12 DIAGNOSIS — Z471 Aftercare following joint replacement surgery: Secondary | ICD-10-CM | POA: Diagnosis not present

## 2015-08-24 ENCOUNTER — Other Ambulatory Visit: Payer: Self-pay | Admitting: Family

## 2015-08-25 ENCOUNTER — Other Ambulatory Visit: Payer: Self-pay | Admitting: Family

## 2015-08-25 DIAGNOSIS — G4733 Obstructive sleep apnea (adult) (pediatric): Secondary | ICD-10-CM | POA: Diagnosis not present

## 2015-08-25 DIAGNOSIS — M069 Rheumatoid arthritis, unspecified: Secondary | ICD-10-CM | POA: Diagnosis not present

## 2015-08-25 DIAGNOSIS — Z6829 Body mass index (BMI) 29.0-29.9, adult: Secondary | ICD-10-CM | POA: Diagnosis not present

## 2015-08-25 DIAGNOSIS — I480 Paroxysmal atrial fibrillation: Secondary | ICD-10-CM | POA: Diagnosis not present

## 2015-08-25 DIAGNOSIS — R918 Other nonspecific abnormal finding of lung field: Secondary | ICD-10-CM | POA: Diagnosis not present

## 2015-08-25 DIAGNOSIS — D509 Iron deficiency anemia, unspecified: Secondary | ICD-10-CM | POA: Diagnosis not present

## 2015-08-25 DIAGNOSIS — Z7901 Long term (current) use of anticoagulants: Secondary | ICD-10-CM | POA: Diagnosis not present

## 2015-08-25 DIAGNOSIS — E440 Moderate protein-calorie malnutrition: Secondary | ICD-10-CM | POA: Diagnosis not present

## 2015-08-25 DIAGNOSIS — Z1389 Encounter for screening for other disorder: Secondary | ICD-10-CM | POA: Diagnosis not present

## 2015-08-25 DIAGNOSIS — D692 Other nonthrombocytopenic purpura: Secondary | ICD-10-CM | POA: Diagnosis not present

## 2015-08-25 DIAGNOSIS — I10 Essential (primary) hypertension: Secondary | ICD-10-CM | POA: Diagnosis not present

## 2015-08-25 DIAGNOSIS — I6990 Unspecified sequelae of unspecified cerebrovascular disease: Secondary | ICD-10-CM | POA: Diagnosis not present

## 2015-09-01 ENCOUNTER — Other Ambulatory Visit: Payer: Self-pay | Admitting: Family

## 2015-09-06 ENCOUNTER — Other Ambulatory Visit: Payer: Self-pay | Admitting: Family

## 2015-09-08 ENCOUNTER — Ambulatory Visit: Payer: PPO | Admitting: Physical Therapy

## 2015-09-08 DIAGNOSIS — H2512 Age-related nuclear cataract, left eye: Secondary | ICD-10-CM | POA: Diagnosis not present

## 2015-09-08 DIAGNOSIS — H25012 Cortical age-related cataract, left eye: Secondary | ICD-10-CM | POA: Diagnosis not present

## 2015-09-10 ENCOUNTER — Ambulatory Visit: Payer: PPO | Admitting: Rehabilitative and Restorative Service Providers"

## 2015-09-10 DIAGNOSIS — M5416 Radiculopathy, lumbar region: Secondary | ICD-10-CM | POA: Diagnosis not present

## 2015-09-10 DIAGNOSIS — M5136 Other intervertebral disc degeneration, lumbar region: Secondary | ICD-10-CM | POA: Diagnosis not present

## 2015-09-10 DIAGNOSIS — M47816 Spondylosis without myelopathy or radiculopathy, lumbar region: Secondary | ICD-10-CM | POA: Diagnosis not present

## 2015-09-17 ENCOUNTER — Telehealth: Payer: Self-pay | Admitting: General Practice

## 2015-09-17 ENCOUNTER — Telehealth: Payer: Self-pay | Admitting: Internal Medicine

## 2015-09-17 DIAGNOSIS — M5416 Radiculopathy, lumbar region: Secondary | ICD-10-CM | POA: Diagnosis not present

## 2015-09-17 NOTE — Telephone Encounter (Signed)
2nd call----Amanda at [facility] has heard back regarding this patient.  Does he need to hold his ELIQUIS prior to injection tomorrow.  Please call ASAP.

## 2015-09-17 NOTE — Telephone Encounter (Signed)
ERROR WRONG PROVIDER.

## 2015-09-17 NOTE — Telephone Encounter (Signed)
1. What  office are you calling from? [place]   2. What is your office phone and fax number? [phone]   3. What type of procedure is the patient having performed? Pain injection    4. What date is procedure scheduled? 3/31   5. What is your question (ex. Antibiotics prior to procedure, holding medication-we need to know how long dentist wants pt to hold med)? Can the pt hold his Eliquis today since he will be receiving the shot on 3/31  6.

## 2015-09-17 NOTE — Telephone Encounter (Signed)
Spoke with Dr Lovena Le, it is okay for him to hold his Eliquis tonight, tomorrow and Friday.  Restart at Orthopedic's recommendation after injection  Spoke with Mrs. Stough and she is aware.  She will speak with Christopher Baker in the morning and I will fax over the request.

## 2015-09-19 DIAGNOSIS — M4726 Other spondylosis with radiculopathy, lumbar region: Secondary | ICD-10-CM | POA: Diagnosis not present

## 2015-09-19 DIAGNOSIS — M5416 Radiculopathy, lumbar region: Secondary | ICD-10-CM | POA: Diagnosis not present

## 2015-09-19 DIAGNOSIS — Z981 Arthrodesis status: Secondary | ICD-10-CM | POA: Diagnosis not present

## 2015-09-22 DIAGNOSIS — M5416 Radiculopathy, lumbar region: Secondary | ICD-10-CM | POA: Diagnosis not present

## 2015-09-23 DIAGNOSIS — H578 Other specified disorders of eye and adnexa: Secondary | ICD-10-CM | POA: Diagnosis not present

## 2015-09-23 DIAGNOSIS — H25012 Cortical age-related cataract, left eye: Secondary | ICD-10-CM | POA: Diagnosis not present

## 2015-09-23 DIAGNOSIS — H25812 Combined forms of age-related cataract, left eye: Secondary | ICD-10-CM | POA: Diagnosis not present

## 2015-09-23 DIAGNOSIS — H2512 Age-related nuclear cataract, left eye: Secondary | ICD-10-CM | POA: Diagnosis not present

## 2015-09-24 DIAGNOSIS — M19011 Primary osteoarthritis, right shoulder: Secondary | ICD-10-CM | POA: Diagnosis not present

## 2015-09-24 DIAGNOSIS — Z96611 Presence of right artificial shoulder joint: Secondary | ICD-10-CM | POA: Diagnosis not present

## 2015-09-24 DIAGNOSIS — Z471 Aftercare following joint replacement surgery: Secondary | ICD-10-CM | POA: Diagnosis not present

## 2015-09-24 DIAGNOSIS — M5416 Radiculopathy, lumbar region: Secondary | ICD-10-CM | POA: Diagnosis not present

## 2015-09-29 DIAGNOSIS — M5416 Radiculopathy, lumbar region: Secondary | ICD-10-CM | POA: Diagnosis not present

## 2015-10-01 ENCOUNTER — Other Ambulatory Visit: Payer: Self-pay | Admitting: Family

## 2015-10-02 ENCOUNTER — Other Ambulatory Visit: Payer: Self-pay | Admitting: Family

## 2015-10-06 ENCOUNTER — Other Ambulatory Visit: Payer: Self-pay | Admitting: Family

## 2015-10-06 DIAGNOSIS — M5416 Radiculopathy, lumbar region: Secondary | ICD-10-CM | POA: Diagnosis not present

## 2015-10-07 DIAGNOSIS — H25011 Cortical age-related cataract, right eye: Secondary | ICD-10-CM | POA: Diagnosis not present

## 2015-10-07 DIAGNOSIS — H2511 Age-related nuclear cataract, right eye: Secondary | ICD-10-CM | POA: Diagnosis not present

## 2015-10-07 DIAGNOSIS — H578 Other specified disorders of eye and adnexa: Secondary | ICD-10-CM | POA: Diagnosis not present

## 2015-10-07 DIAGNOSIS — H25811 Combined forms of age-related cataract, right eye: Secondary | ICD-10-CM | POA: Diagnosis not present

## 2015-10-09 DIAGNOSIS — M5416 Radiculopathy, lumbar region: Secondary | ICD-10-CM | POA: Diagnosis not present

## 2015-10-10 ENCOUNTER — Other Ambulatory Visit: Payer: Self-pay | Admitting: Family

## 2015-10-11 ENCOUNTER — Other Ambulatory Visit: Payer: Self-pay | Admitting: Family

## 2015-10-13 DIAGNOSIS — M5416 Radiculopathy, lumbar region: Secondary | ICD-10-CM | POA: Diagnosis not present

## 2015-10-13 DIAGNOSIS — M5136 Other intervertebral disc degeneration, lumbar region: Secondary | ICD-10-CM | POA: Diagnosis not present

## 2015-10-13 DIAGNOSIS — M47816 Spondylosis without myelopathy or radiculopathy, lumbar region: Secondary | ICD-10-CM | POA: Diagnosis not present

## 2015-10-15 DIAGNOSIS — M5416 Radiculopathy, lumbar region: Secondary | ICD-10-CM | POA: Diagnosis not present

## 2015-10-20 ENCOUNTER — Telehealth: Payer: Self-pay

## 2015-10-20 DIAGNOSIS — M5416 Radiculopathy, lumbar region: Secondary | ICD-10-CM | POA: Diagnosis not present

## 2015-10-20 NOTE — Telephone Encounter (Addendum)
[name] called. Pt is starting to have weakness in both legs. Pt is now using a cane almost 100 % of the time, due to fear of falling. Pt has had to give up golf, choir, etc. Pt has been evaluated by PCP. Pt has an October appointment scheduled, was given next available - however, that is not until August. Pt was added to wait list as well. Please advise.   Back surgery 5 years ago at [facility]   [name] [phone]

## 2015-10-20 NOTE — Telephone Encounter (Signed)
He is on the wait-list, which is good.  We will get him in when a slot opens up.  What did his PCP tell them?  I would really defer to the PCP since the PCP examined him.  In the meantime, I would send him for physical therapy.

## 2015-10-22 ENCOUNTER — Other Ambulatory Visit: Payer: Self-pay | Admitting: Family Medicine

## 2015-10-22 DIAGNOSIS — M5136 Other intervertebral disc degeneration, lumbar region: Secondary | ICD-10-CM | POA: Diagnosis not present

## 2015-10-22 DIAGNOSIS — M2550 Pain in unspecified joint: Secondary | ICD-10-CM | POA: Diagnosis not present

## 2015-10-22 DIAGNOSIS — M545 Low back pain: Secondary | ICD-10-CM | POA: Diagnosis not present

## 2015-10-22 DIAGNOSIS — L4059 Other psoriatic arthropathy: Secondary | ICD-10-CM | POA: Diagnosis not present

## 2015-10-22 MED ORDER — PANTOPRAZOLE SODIUM 40 MG PO TBEC: 40.0000 mg | DELAYED_RELEASE_TABLET | Freq: Every day | ORAL | Status: AC

## 2015-10-23 DIAGNOSIS — M5416 Radiculopathy, lumbar region: Secondary | ICD-10-CM | POA: Diagnosis not present

## 2015-10-27 ENCOUNTER — Other Ambulatory Visit: Payer: Self-pay | Admitting: Internal Medicine

## 2015-10-27 ENCOUNTER — Ambulatory Visit: Payer: PPO | Admitting: Family Medicine

## 2015-10-28 ENCOUNTER — Ambulatory Visit: Payer: PPO | Admitting: Neurology

## 2015-10-28 NOTE — Telephone Encounter (Signed)
40 mg daily

## 2015-10-28 NOTE — Telephone Encounter (Signed)
Per patients wife, the patient is taking 40mg  qd. Last refill was for 40mg  bid with a note(take 40mg  qd). Please advise on how this should be ordered. Thanks, MI

## 2015-10-29 DIAGNOSIS — D692 Other nonthrombocytopenic purpura: Secondary | ICD-10-CM | POA: Diagnosis not present

## 2015-10-29 DIAGNOSIS — L720 Epidermal cyst: Secondary | ICD-10-CM | POA: Diagnosis not present

## 2015-10-29 DIAGNOSIS — L738 Other specified follicular disorders: Secondary | ICD-10-CM | POA: Diagnosis not present

## 2015-10-29 DIAGNOSIS — D2239 Melanocytic nevi of other parts of face: Secondary | ICD-10-CM | POA: Diagnosis not present

## 2015-10-29 DIAGNOSIS — Z961 Presence of intraocular lens: Secondary | ICD-10-CM | POA: Diagnosis not present

## 2015-10-29 DIAGNOSIS — L814 Other melanin hyperpigmentation: Secondary | ICD-10-CM | POA: Diagnosis not present

## 2015-10-29 DIAGNOSIS — Z85828 Personal history of other malignant neoplasm of skin: Secondary | ICD-10-CM | POA: Diagnosis not present

## 2015-10-29 DIAGNOSIS — D225 Melanocytic nevi of trunk: Secondary | ICD-10-CM | POA: Diagnosis not present

## 2015-10-29 DIAGNOSIS — L565 Disseminated superficial actinic porokeratosis (DSAP): Secondary | ICD-10-CM | POA: Diagnosis not present

## 2015-10-29 DIAGNOSIS — L821 Other seborrheic keratosis: Secondary | ICD-10-CM | POA: Diagnosis not present

## 2015-10-30 DIAGNOSIS — M5416 Radiculopathy, lumbar region: Secondary | ICD-10-CM | POA: Diagnosis not present

## 2015-10-31 ENCOUNTER — Telehealth: Payer: Self-pay | Admitting: Pharmacist

## 2015-10-31 NOTE — Telephone Encounter (Signed)
Received fax from [facility]  Pt is scheduled for an injection on 5/19 and needs clearance to stop Eliquis x 3 days prior.  Reviewed chart.  Pt does have a CHADS score of 4 including CVA.  Dr. Lovena Le gave okay to hold x 2 1/2 days in March for same procedure.  Will fax back clearance for pt to do the same this time.  He will take his last dose of Eliquis on morning of 5/16.

## 2015-11-05 ENCOUNTER — Ambulatory Visit: Payer: PPO | Admitting: Neurology

## 2015-11-07 DIAGNOSIS — M961 Postlaminectomy syndrome, not elsewhere classified: Secondary | ICD-10-CM | POA: Diagnosis not present

## 2015-11-07 DIAGNOSIS — M5416 Radiculopathy, lumbar region: Secondary | ICD-10-CM | POA: Diagnosis not present

## 2015-11-11 DIAGNOSIS — M5416 Radiculopathy, lumbar region: Secondary | ICD-10-CM | POA: Diagnosis not present

## 2015-11-14 DIAGNOSIS — M5416 Radiculopathy, lumbar region: Secondary | ICD-10-CM | POA: Diagnosis not present

## 2015-11-18 DIAGNOSIS — M5416 Radiculopathy, lumbar region: Secondary | ICD-10-CM | POA: Diagnosis not present

## 2015-11-21 DIAGNOSIS — M5416 Radiculopathy, lumbar region: Secondary | ICD-10-CM | POA: Diagnosis not present

## 2015-11-24 DIAGNOSIS — M47816 Spondylosis without myelopathy or radiculopathy, lumbar region: Secondary | ICD-10-CM | POA: Diagnosis not present

## 2015-11-26 DIAGNOSIS — G4733 Obstructive sleep apnea (adult) (pediatric): Secondary | ICD-10-CM | POA: Diagnosis not present

## 2015-12-08 DIAGNOSIS — D485 Neoplasm of uncertain behavior of skin: Secondary | ICD-10-CM | POA: Diagnosis not present

## 2015-12-08 DIAGNOSIS — L720 Epidermal cyst: Secondary | ICD-10-CM | POA: Diagnosis not present

## 2015-12-08 DIAGNOSIS — Z85828 Personal history of other malignant neoplasm of skin: Secondary | ICD-10-CM | POA: Diagnosis not present

## 2015-12-11 ENCOUNTER — Encounter: Payer: Self-pay | Admitting: Physical Therapy

## 2015-12-11 ENCOUNTER — Ambulatory Visit: Payer: PPO | Attending: Physical Medicine and Rehabilitation | Admitting: Physical Therapy

## 2015-12-11 DIAGNOSIS — M545 Low back pain: Secondary | ICD-10-CM | POA: Insufficient documentation

## 2015-12-11 DIAGNOSIS — R2689 Other abnormalities of gait and mobility: Secondary | ICD-10-CM | POA: Diagnosis not present

## 2015-12-11 DIAGNOSIS — M6281 Muscle weakness (generalized): Secondary | ICD-10-CM

## 2015-12-11 DIAGNOSIS — R2681 Unsteadiness on feet: Secondary | ICD-10-CM | POA: Diagnosis not present

## 2015-12-11 DIAGNOSIS — R252 Cramp and spasm: Secondary | ICD-10-CM | POA: Insufficient documentation

## 2015-12-11 NOTE — Therapy (Signed)
[place], [place], [address] Phone: [phone]   Fax:  [phone]  Physical Therapy Evaluation  Patient Details  Name: Christopher Baker MRN: [id number] Date of Birth: November 30, 1933 Referring Provider: Suella Broad Md.  Encounter Date: 12/11/2015      PT End of Session - 12/11/15 1330    Visit Number 1   Number of Visits 17   Date for PT Re-Evaluation 02/05/16   Authorization Type [employer]: Kx mod by 15th visit; progress note/ G code by 10th visit   Activity Tolerance Patient tolerated treatment well;Other (comment)  limited balance   Behavior During Therapy [facility] for tasks assessed/performed      Past Medical History  Diagnosis Date  . Hyperlipidemia   . HTN (hypertension)   . Status post placement of cardiac pacemaker DDD-  06-04-10-- LAST CHECK 09-08-10 IN EPIC    SECONDARY TO SYNCOPY AND BRADYCARDIA  . Restless leg syndrome   . Carotid stenosis, bilateral PER DR EARLY / DUPLEX  09-09-10      40 - 59%   BILATERALLY--- ASYMPTOMATIC  . History of prostate cancer S/P RADIATION  TX  6 YRS AGO  . Arthritis with psoriasis ([place])   . GERD (gastroesophageal reflux disease)     CONTROLLED W/ PROTONIX  . Borderline diabetes     DIET CONTROLLED - PT STATES HE IS NOT DIABETIC  . Lumbar spondylosis W/ RADICULOPATHY  . Iron deficiency   . Cataract immature LEFT EYE  . PAF (paroxysmal atrial fibrillation) ([place])   . SVT (supraventricular tachycardia) ([place])     S/P ABLATION   2008  . Sleep apnea TESTED YRS AGO-- NO CPAP RX GIVEN    PT STATES HE DOES NOT THINK HE STILL HAS SLEEP APNEA--DOES NOT USE CPAP  . Dysrhythmia     a-fib  . Stroke [facility]) Oct. 14, 2014    light left hand, balance issues, short term memory loss 2014  . Cancer [facility])     prostate  2008    Past Surgical History  Procedure Laterality Date  . Replacement total knee  1997    RIGHT  . Cardiac pacemaker placement  06-04-10     DDD/ AND REMOVAL LOOR RECORDER  . Loop recorder placement  01-30-10  . Lumbar laminectomy/ diskectomy/ fusion  05-19-10    L4 - 5  . Cholecystectomy  2009  . Cardiac electrophysiology study and ablation  2008    FOR SVT  . Prostate biopsy  2007  . Transurethral resection of prostate  2006  . Inguinal hernia repair  2005    LEFT/   DONE WITH PENILE PROSTESIS SURG.  . Removal and placement penile prosthesis implant   2005    AND LEFT CORPOROPLASTY /    DONE INGUINAL REPAIR  . Penile prosthesis implant  1995  . Knee arthroscopy  06/02/2011    Procedure: ARTHROSCOPY KNEE;  Surgeon: Gearlean Alf;  Location: [place];  Service: Orthopedics;  Laterality: Left;  LEFT KNEE ARTHROSCOPY WITH DEBRIDEMENT  . Total knee arthroplasty  12/20/2011    Procedure: TOTAL KNEE ARTHROPLASTY;  Surgeon: Gearlean Alf, MD;  Location: [facility];  Service: Orthopedics;  Laterality: Left;  . Back surgery    . Joint replacement    . Spine surgery    . Esophagogastroduodenoscopy N/A 12/12/2014    Procedure: ESOPHAGOGASTRODUODENOSCOPY (EGD);  Surgeon: Wilford Corner, MD;  Location: [facility] ENDOSCOPY;  Service: Endoscopy;  Laterality: N/A;  .  Total shoulder arthroplasty Right 06/27/2015    Procedure: REVERSE TOTAL SHOULDER ARTHROPLASTY;  Surgeon: Netta Cedars, MD;  Location: [place];  Service: Orthopedics;  Laterality: Right;    There were no vitals filed for this visit.       Subjective Assessment - 12/11/15 1202    Subjective pt is a 82 y.o with CC of low back pain and feeling off balance. per pts wife he is getting an epidural injection the low back tomorrow. Pain in the back started non-traumatically, with pain fluctuating in nature. pt reports back pain has been going on for the last 2-3 years has a hx of lumbar fusion. pt has hx of stroke in 2014 which has caused his balance to be of. pain in the back has gotten better, but the biggest issues is feeling. reports biggest issue is going from sitting to  standing feeling woosey.     Pertinent History R sided stroke, multiple back injections, hx of prostate cx.    Limitations Lifting;Standing;Walking;House hold activities   How long can you sit comfortably? unlimited   How long can you stand comfortably? 15 min    How long can you walk comfortably? 10-15 min   Diagnostic tests no recent imaging on back   Patient Stated Goals to improve balance, get stronger, to decrease pain, to get back to playing golf.    Currently in Pain? Yes   Pain Score 1    Pain Location Back   Pain Orientation Lower;Right   Pain Descriptors / Indicators Aching;Burning   Pain Type Chronic pain   Pain Radiating Towards R knee occasionally but has gottne better since the fusion   Pain Onset More than a month ago   Pain Frequency Intermittent   Aggravating Factors  prolonged standing/ walking, hitting golf balls,    Pain Relieving Factors exercises given from previous PT, laying down and resting            [facility] PT Assessment - 12/11/15 1215    Assessment   Medical Diagnosis Lumbar spine OA, Degenerative disc disease, and gait/ balance disorder   Referring Provider Suella Broad Md.   Onset Date/Surgical Date --  2-3 years, balance since strok in 2014   Hand Dominance Right   Next MD Visit 12/12/2015  getting epidural injection   Prior Therapy yes for his shoulder    Precautions   Precautions None   Restrictions   Weight Bearing Restrictions No   Balance Screen   Has the patient fallen in the past 6 months Yes   How many times? 3   Has the patient had a decrease in activity level because of a fear of falling?  No   Is the patient reluctant to leave their home because of a fear of falling?  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[place], movies, table games,    Cognition   Memory Appears intact  wife reports she helps him  Observation/Other Assessments   Focus on Therapeutic Outcomes (FOTO)  33% limited  predicted 28% limited   Posture/Postural Control   Posture/Postural Control Postural limitations   Postural Limitations Rounded Shoulders;Forward head  posterior trunk lean in standing   ROM / Strength   AROM / PROM / Strength AROM;Strength   AROM   AROM Assessment Site Lumbar   Lumbar Flexion 52   Lumbar Extension 10   Lumbar - Right Side Bend 10   Lumbar - Left Side Bend 10   Lumbar - Right Rotation 3   Strength   Strength Assessment Site Hip;Knee   Right/Left Hip Right;Left   Right Hip Flexion 3+/5   Right Hip Extension 3+/5   Right Hip ABduction 3-/5   Right Hip ADduction 4/5   Left Hip Flexion 3+/5   Left Hip Extension 3+/5   Left Hip ABduction 3/5   Left Hip ADduction 4/5   Right/Left Knee Right;Left   Right Knee Flexion 4+/5   Right Knee Extension 4+/5   Left Knee Flexion 4+/5   Left Knee Extension 4+/5   Palpation   Spinal mobility hypomobility of L1-L5 PAIVM P>A   Palpation comment  tenderness upon palpation at the L hip musculautre with multiple triger points noted especially in the abductors and extensors with tightness in the lumbar paraspinasl bil with L>R   Ambulation/Gait   Ambulation/Gait Yes   Assistive device Straight cane   Gait Pattern Step-to pattern;Decreased arm swing - right;Decreased arm swing - left;Decreased step length - left;Decreased step length - right;Shuffle;Trendelenburg;Antalgic;Trunk flexed;Narrow base of support;Decreased trunk rotation                            PT Education - 12/11/15 1329    Education provided Yes   Education Details evaluation findings, POC, goals, HEP with proper form and treatment rationale, plan for next visit with regard to testing balance and making appropriate goals. discussed possible orthostatic issues with dizzininess going from sit to standing and reported he should speak to his cardiologist or PCP.    Person(s) Educated Patient;Spouse   Methods Explanation;Verbal cues;Handout   Comprehension Verbalized understanding;Verbal cues required          PT Short Term Goals - 12/11/15 1346    PT SHORT TERM GOAL #1   Title pt will be I with inital HEP (01/10/2016)   Time 4   Period Weeks   Status New   PT SHORT TERM GOAL #2   Title pt will be able to verbalize and demonstrate proper posture and lifting and carrying techniques to prevent and reduce low back pain (01/10/2016)   Time 4   Period Weeks   Status New   PT SHORT TERM GOAL #3   Title pt will improve bil hip abductor/ extensor strength to >/= 4-/5 with >/= 2/10 pain in the low back to promote functional stability (01/10/2016)   Time 4   Period Weeks   Status New   PT SHORT TERM GOAL #4   Title berg balance testing will be performed and goals well be made   Time 2   Period Weeks   Status New   PT SHORT TERM GOAL #5   Title pt will improve trunk mobility by >/= 5 degrees in all planes with </= 2/10 pain and to promote functional progression (01/10/2016)   Time 4   Period Weeks   Status New  PT Long Term Goals - 12/11/15 1349    PT LONG TERM GOAL #1   Title pt will be I with all HEP given as of last visit (02/05/2016)   Time 8   Period Weeks   Status New   PT LONG TERM GOAL #2   Title He will improve trunk flexion and side bending by >/= 10 degrees with </= 1/10 pain </= low guard with no report of feeling like he will fall to promote functional trunk mobility and safety during ADLS ([id number])    Time 8   Period Weeks   Status New   PT LONG TERM GOAL #3   Title pt will improve bil hip strength to >/= 4/5 to for functional endurance with walking/ standing and promote safety ( 02/05/2016)   Time 8   Period Weeks   Status New   PT LONG TERM GOAL #4   Title pt will be able to walk/ stand for >/= 45 minutes with LRAD with </= 1/10 pain to assist with functional endruance for ADLs and assist with pt's goal of returning to playing golf (02/05/2016)   Period Weeks   Status New   PT LONG TERM GOAL #5   Title at discharge pt with improve his FOTO score to >/= 72 to demonstrate improvement in function (02/05/2016)   Period Weeks   Status New               Plan - 12/11/15 1333    Clinical Impression Statement Christopher Baker presents to OPPT as a high complexity evaluation with CC of chronic low back pain with unsteadiness and feeling off balance. pt rpeorts hx of 3 falls in the last 6 months. He demonstrates limited trunk mobility in all planes demonstating moderate guarding due to fear of falling. MMT revealed weakness of the bil hips with L>R. due to time constraints Balance was not tested today but will be next visit.  He demonstrates antalgic gait pattern with trendelenberg gait pattern exhibit limited step length and minimal trunk rotation with moderate postural sway that is worse with sit > standing transitions. tenderness upon palpation at the L hip musculautre with multiple triger points noted especially in the abductors and extensors with tightness in the lumbar paraspinasl bil with L>R. pt would benefit from physical therapy to decrease low back pain, reduce muscle tightness, improve trunk mobility, increase LE strength, improve static and dynamic balance for safety and to maximize his function by addressing the impairments listed.   High complexity evaluation due to hx of stroke, hypertension, Cancer, age, eval findings of  limited balance, weakness, abnormal posture, abnormal gait, fluctuatin  gpain in back and unpredictable unsteadiness   Rehab Potential Good   PT Frequency 3x / week   PT Duration 8 weeks   PT Treatment/Interventions ADLs/Self Care Home Management;Cryotherapy;Electrical Stimulation;Iontophoresis 4mg /ml Dexamethasone;Moist Heat;Therapeutic exercise;Manual techniques;Vasopneumatic Device;Taping;Dry needling;Ultrasound;Therapeutic activities;Patient/family education;Passive range of motion   PT Next Visit Plan assess/ review HEP, Berg balance testing/ TUG make goals based on findings, core strengthening, balance training, LE strengthening, (HX OF CX: NO MHP OR E-STIM)   PT Home Exercise Plan clam shells, bridges, hamstring stretching, SLR   Consulted and Agree with Plan of Care Patient      Patient will benefit from skilled therapeutic intervention in order to improve the following deficits and impairments:  Pain, Improper body mechanics, Postural dysfunction, Decreased balance, Decreased endurance, Decreased activity tolerance, Difficulty walking, Decreased range of motion, Abnormal gait, Hypomobility, Increased fascial restricitons, Decreased strength, Decreased  mobility  Visit Diagnosis: Bilateral low back pain, with sciatica presence unspecified - Plan: PT plan of care cert/re-cert  Muscle weakness (generalized) - Plan: PT plan of care cert/re-cert  Cramp and spasm - Plan: PT plan of care cert/re-cert  Unsteadiness on feet - Plan: PT plan of care cert/re-cert  Other abnormalities of gait and mobility - Plan: PT plan of care cert/re-cert      G-Codes - [id number]    Functional Assessment Tool Used FOTO/ clinical judgement   Functional Limitation Mobility: Walking and moving around   Mobility: Walking and Moving Around Current Status [id number]) At least 20 percent but less than 40 percent impaired, limited or restricted   Mobility: Walking and Moving Around Goal Status [phone]) At least 1 percent but less than 20 percent impaired, limited or restricted        Problem List Patient Active Problem List   Diagnosis Date Noted  . S/P shoulder replacement 06/27/2015  . Residual cognitive deficit as late effect of stroke 04/15/2015  . Lung nodule   . Abnormal CXR 12/12/2014  . GI bleed 12/11/2014  . Weakness generalized 12/11/2014  . Leukocytosis 12/11/2014  . Restless leg syndrome 12/11/2014  . Anemia 12/11/2014  . Abnormal EKG 12/11/2014  . Gait disturbance, post-stroke 10/22/2014  . TIA (transient ischemic attack) 06/18/2014  . Partial seizures ([place]) 06/18/2014  . Seizure ([place]) 06/18/2014  . Vitamin B12 deficiency 06/18/2014  . OSA (obstructive sleep apnea) 02/26/2014  . Cognitive deficits as late effect of cerebrovascular disease 01/11/2014  . Fatigue 09/20/2013  . Flu-like symptoms 06/19/2013  . Intermittent lightheadedness 06/19/2013  . Low blood pressure, not hypotension 06/19/2013  . Influenza with respiratory manifestations 06/19/2013  . Embolic cerebral infarction ([place]) 04/09/2013  . CVA (cerebral infarction) 04/03/2013  . Occlusion and stenosis of carotid artery without mention of cerebral infarction 11/21/2012  . OA (osteoarthritis) of knee 12/20/2011  . Methotrexate, long term, current use 09/10/2011  . Knee pain, left 07/27/2011  . Pacemaker-Medtronic 09/08/2010  . UNSPECIFIED VISUAL DISTURBANCE 07/20/2010  . SYNCOPE 06/01/2010  . IRON DEFIC ANEMIA [place] DIET IRON INTAKE 04/20/2010  . PSORIASIS 04/20/2010  . HEADACHE, PERSISTENT 01/13/2010  . HEMATURIA UNSPECIFIED 11/19/2009  . PHLEBITIS AND THROMBOPHLEBITIS OF OTHER SITE 07/02/2009  . CELLULITIS, LEG, LEFT 07/02/2009  . DISUSE OSTEOPOROSIS 05/12/2009  . HYPERGLYCEMIA, BORDERLINE 01/10/2009  . SPONDYLOSIS, LUMBAR, WITH RADICULOPATHY 10/15/2008  . OTHER SPECIFIED DERMATOMYCOSES 06/24/2008  . EXTRINSIC ASTHMA, WITH EXACERBATION 05/01/2008  . CALCU GALLBLADD W/OTH CHOLECYSTITIS&OBSTRUCTION 02/20/2008  . VARICOSE VEINS LOWER EXTREMITIES W/INFLAMMATION 12/25/2007  .  EDEMA 12/25/2007  . INSOMNIA WITH SLEEP APNEA UNSPECIFIED 08/31/2007  . GERD 03/23/2007  . FIBRILLATION, ATRIAL 01/31/2007  . PSORIATIC ARTHRITIS 01/31/2007  . ABDOMINAL BLOATING 01/31/2007  . Hyperlipidemia 01/19/2007  . Essential hypertension 01/19/2007  . PROSTATE CANCER, HX OF 01/19/2007   [place]arr [place] PT, DPT, LAT, ATC  12/11/2015  2:22 PM      [facility] [address] [place], [place], [address] Phone: [phone]   Fax:  [phone]  Name: Christopher Baker MRN: [id number] Date of Birth: [birth year]

## 2015-12-12 DIAGNOSIS — M961 Postlaminectomy syndrome, not elsewhere classified: Secondary | ICD-10-CM | POA: Diagnosis not present

## 2015-12-12 DIAGNOSIS — M47816 Spondylosis without myelopathy or radiculopathy, lumbar region: Secondary | ICD-10-CM | POA: Diagnosis not present

## 2015-12-15 ENCOUNTER — Ambulatory Visit (INDEPENDENT_AMBULATORY_CARE_PROVIDER_SITE_OTHER): Payer: PPO | Admitting: Internal Medicine

## 2015-12-15 ENCOUNTER — Encounter: Payer: Self-pay | Admitting: Internal Medicine

## 2015-12-15 VITALS — BP 124/58 | HR 86 | Ht 75.0 in | Wt 221.1 lb

## 2015-12-15 DIAGNOSIS — R001 Bradycardia, unspecified: Secondary | ICD-10-CM | POA: Diagnosis not present

## 2015-12-15 DIAGNOSIS — Z950 Presence of cardiac pacemaker: Secondary | ICD-10-CM | POA: Diagnosis not present

## 2015-12-15 LAB — CUP PACEART INCLINIC DEVICE CHECK
Brady Statistic AP VS Percent: 17 %
Brady Statistic AS VS Percent: 73 %
Date Time Interrogation Session: 20170626170956
Implantable Lead Implant Date: 20111215
Implantable Lead Location: 753859
Implantable Lead Model: 4076
Implantable Lead Model: 4076
Lead Channel Impedance Value: 409 Ohm
Lead Channel Pacing Threshold Amplitude: 0.75 V
Lead Channel Pacing Threshold Amplitude: 1.25 V
Lead Channel Pacing Threshold Pulse Width: 0.4 ms
Lead Channel Pacing Threshold Pulse Width: 0.4 ms
Lead Channel Sensing Intrinsic Amplitude: 2.8 mV
Lead Channel Setting Pacing Amplitude: 2.5 V
Lead Channel Setting Pacing Pulse Width: 0.4 ms
MDC IDC LEAD IMPLANT DT: 20111215
MDC IDC LEAD LOCATION: 753860
MDC IDC MSMT BATTERY IMPEDANCE: 324 Ohm
MDC IDC MSMT BATTERY REMAINING LONGEVITY: 113 mo
MDC IDC MSMT BATTERY VOLTAGE: 2.79 V
MDC IDC MSMT LEADCHNL RA PACING THRESHOLD AMPLITUDE: 0.625 V
MDC IDC MSMT LEADCHNL RA PACING THRESHOLD PULSEWIDTH: 0.4 ms
MDC IDC MSMT LEADCHNL RA PACING THRESHOLD PULSEWIDTH: 0.4 ms
MDC IDC MSMT LEADCHNL RV IMPEDANCE VALUE: 393 Ohm
MDC IDC MSMT LEADCHNL RV PACING THRESHOLD AMPLITUDE: 1.625 V
MDC IDC MSMT LEADCHNL RV SENSING INTR AMPL: 5.6 mV
MDC IDC SET LEADCHNL RA PACING AMPLITUDE: 2 V
MDC IDC SET LEADCHNL RV SENSING SENSITIVITY: 2.8 mV
MDC IDC STAT BRADY AP VP PERCENT: 6 %
MDC IDC STAT BRADY AS VP PERCENT: 4 %

## 2015-12-15 NOTE — Patient Instructions (Signed)
Medication Instructions:  Your physician has recommended you make the following change in your medication:  DECREASE  ZESTRIL TO 1/2 TAB DAILY IF  NO  IMPROVEMENT  WITH SYMPTOMS RETURN TO ORIGINAL   DOSE   Labwork: NONE Testing/Procedures: NONE  Follow-Up: Your physician wants you to follow-up in: REMOTE DEVICE   CHECK  03-15-16 AND   SEE DR  Lovena Le IN   A  YEAR You will receive a reminder letter in the mail two months in advance. If you don't receive a letter, please call our office to schedule the follow-up appointment.  Any Other Special Instructions Will Be Listed Below (If Applicable).     If you need a refill on your cardiac medications before your next appointment, please call your pharmacy.

## 2015-12-15 NOTE — Progress Notes (Signed)
HPI [name] returns today for followup. He is a very pleasant 82 year old man with a history of unexplained syncope, symptomatic bradycardia, status post permanent pacemaker insertion, and hypertension. He has had a stroke as he previously had refused anti-coagulation despite his documented PAF. He admits to occaisionally missing his medications including Xarelto. He has also had uncontrolled HTN. He has had some chronic lower extremity edema which has improved with lasix. He admits to some dietary non-compliance. He notes orthostatic symptoms.  Allergies  Allergen Reactions  . Ciprofloxacin Nausea Only    REACTION: GI upset  . Hydrocodone-Acetaminophen     Hallucinations in higher doses  . Other Other (See Comments)    SCALLOPS--SUDDEN GI ATTACK     Current Outpatient Prescriptions  Medication Sig Dispense Refill  . apixaban (ELIQUIS) 5 MG TABS tablet Take 1 tablet (5 mg total) by mouth 2 (two) times daily. 60 tablet 11  . atorvastatin (LIPITOR) 20 MG tablet Take 20 mg by mouth daily.    . citalopram (CELEXA) 20 MG tablet Take 20 mg by mouth daily. Take 2 tablets (40mg ) daily    . clotrimazole-betamethasone (LOTRISONE) cream Apply 1 application topically 2 (two) times daily as needed (psoriasis).    Marland Kitchen docusate sodium (COLACE) 100 MG capsule Take 100 mg by mouth as needed. Reported on 12/11/2015    . ezetimibe (ZETIA) 10 MG tablet Take 1 tablet (10 mg total) by mouth daily. 90 tablet 1  . folic acid (FOLVITE) 1 MG tablet Take 1 mg by mouth daily.    . furosemide (LASIX) 20 MG tablet TAKE 2 TABLETS (40 MG TOTAL) BY MOUTH DAILY. 60 tablet 1  . lisinopril (PRINIVIL,ZESTRIL) 10 MG tablet Take 10 mg by mouth daily.    . methotrexate (RHEUMATREX) 2.5 MG tablet Take 4 tablets (10 mg total) by mouth once a week. Caution:Chemotherapy. Protect from light. Every monday 4 tablet 1  . mirabegron ER (MYRBETRIQ) 50 MG TB24 tablet Take 1 tablet (50 mg total) by mouth daily. 90 tablet 3  . pantoprazole  (PROTONIX) 40 MG tablet Take 1 tablet (40 mg total) by mouth daily. 90 tablet 3  . predniSONE (DELTASONE) 5 MG tablet Take 1.5 tablets (7.5 mg total) by mouth daily. 135 tablet 0  . triamcinolone cream (KENALOG) 0.1 % Apply to affected area as directed (Patient taking differently: Apply 1 application topically daily as needed (rash). Apply to affected area as directed) 480 g 0   No current facility-administered medications for this visit.     Past Medical History  Diagnosis Date  . Hyperlipidemia   . HTN (hypertension)   . Status post placement of cardiac pacemaker DDD-  06-04-10-- LAST CHECK 09-08-10 IN EPIC    SECONDARY TO SYNCOPY AND BRADYCARDIA  . Restless leg syndrome   . Carotid stenosis, bilateral PER DR EARLY / DUPLEX  09-09-10      40 - 59%   BILATERALLY--- ASYMPTOMATIC  . History of prostate cancer S/P RADIATION  TX  6 YRS AGO  . Arthritis with psoriasis ([place])   . GERD (gastroesophageal reflux disease)     CONTROLLED W/ PROTONIX  . Borderline diabetes     DIET CONTROLLED - PT STATES HE IS NOT DIABETIC  . Lumbar spondylosis W/ RADICULOPATHY  . Iron deficiency   . Cataract immature LEFT EYE  . PAF (paroxysmal atrial fibrillation) ([place])   . SVT (supraventricular tachycardia) ([place])     S/P ABLATION   2008  . Sleep apnea TESTED YRS AGO-- NO CPAP  RX GIVEN    PT STATES HE DOES NOT THINK HE STILL HAS SLEEP APNEA--DOES NOT USE CPAP  . Dysrhythmia     a-fib  . Stroke [facility]) Oct. 14, 2014    light left hand, balance issues, short term memory loss 2014  . Cancer [facility])     prostate  2008    ROS:   All systems reviewed and negative except as noted in the HPI.   Past Surgical History  Procedure Laterality Date  . Replacement total knee  1997    RIGHT  . Cardiac pacemaker placement  06-04-10    DDD/ AND REMOVAL LOOR RECORDER  . Loop recorder placement  01-30-10  . Lumbar laminectomy/ diskectomy/ fusion  05-19-10    L4 - 5  . Cholecystectomy  2009  . Cardiac  electrophysiology study and ablation  2008    FOR SVT  . Prostate biopsy  2007  . Transurethral resection of prostate  2006  . Inguinal hernia repair  2005    LEFT/   DONE WITH PENILE PROSTESIS SURG.  . Removal and placement penile prosthesis implant   2005    AND LEFT CORPOROPLASTY /    DONE INGUINAL REPAIR  . Penile prosthesis implant  1995  . Knee arthroscopy  06/02/2011    Procedure: ARTHROSCOPY KNEE;  Surgeon: Gearlean Alf;  Location: [place];  Service: Orthopedics;  Laterality: Left;  LEFT KNEE ARTHROSCOPY WITH DEBRIDEMENT  . Total knee arthroplasty  12/20/2011    Procedure: TOTAL KNEE ARTHROPLASTY;  Surgeon: Gearlean Alf, MD;  [facility];  Service: Orthopedics;  Laterality: Left;  . Back surgery    . Joint replacement    . Spine surgery    . Esophagogastroduodenoscopy N/A 12/12/2014    Procedure: ESOPHAGOGASTRODUODENOSCOPY (EGD);  Surgeon: Wilford Corner, MD;  Location[facility] END[facility]COPY;  Service: Endoscopy;  Laterality: N/A;  . Total shoulder arthroplasty Right 06/27/2015    Procedure: REVERSE TOTAL SHOULDER ARTHROPLASTY;  Surgeon: Netta Cedars, MD;  Location: [place];  Service: Orthopedics;  Laterality: Right;     Family History  Problem Relation Age of Onset  . Stroke Mother   . Early death Father      Social History   Social History  . Marital Status: Married    Spouse Name: N/A  . Number of Children: N/A  . Years of Education: N/A   Occupational History  . retired    Social History Main Topics  . Smoking status: Former Smoker -- 0.10 packs/day for 55 years    Types: Cigarettes    Quit date: 12/07/1998  . Smokeless tobacco: Never Used  . Alcohol Use: 3.0 oz/week    5 Shots of liquor per week     Comment: gin and tonic  . Drug Use: No  . Sexual Activity:    Partners: Female   Other Topics Concern  . Not on file   Social History Narrative     BP 124/58 mmHg  Pulse 86  Ht 6\' 3"  (1.905 m)  Wt 221 lb 1.9 oz (100.299 kg)  BMI  27.64 kg/m2  Physical Exam:  stable appearing 82 yo man, NAD HEENT: Unremarkable Neck:  7 cm JVD, no thyromegally Back:  No CVA tenderness Lungs:  Clear with no wheezes, well healed PPM incision. HEART:  Regular rate rhythm, no murmurs, no rubs, no clicks Abd:  soft, positive bowel sounds, no organomegally, no rebound, no guarding Ext:  2 plus pulses, + peripheral edema, no cyanosis,  no clubbing Skin:  No rashes no nodules Neuro:  CN II through XII intact, motor grossly intact  ECG- NSR with ventricular pacing  DEVICE  Normal device function.  See PaceArt for details.   Assess/Plan: 1. PAF - he is maintaining NSR over 99% 2. Orthostasis - we have asked him to reduce his dose of Zestril in half. If he is not improved, then he will go back to full strength. 3. HTN - his blood pressure is good today. No change in meds. He is encouraged to reduce his salt intake. 4. PPM - his medtronic DDD PM is working normally. Will recheck in several months

## 2015-12-26 ENCOUNTER — Ambulatory Visit: Payer: PPO | Attending: Physical Medicine and Rehabilitation | Admitting: Physical Therapy

## 2015-12-26 DIAGNOSIS — R252 Cramp and spasm: Secondary | ICD-10-CM | POA: Diagnosis not present

## 2015-12-26 DIAGNOSIS — R2681 Unsteadiness on feet: Secondary | ICD-10-CM | POA: Diagnosis not present

## 2015-12-26 DIAGNOSIS — M6281 Muscle weakness (generalized): Secondary | ICD-10-CM | POA: Insufficient documentation

## 2015-12-26 DIAGNOSIS — M5136 Other intervertebral disc degeneration, lumbar region: Secondary | ICD-10-CM | POA: Diagnosis not present

## 2015-12-26 DIAGNOSIS — R2689 Other abnormalities of gait and mobility: Secondary | ICD-10-CM | POA: Diagnosis not present

## 2015-12-26 DIAGNOSIS — M47816 Spondylosis without myelopathy or radiculopathy, lumbar region: Secondary | ICD-10-CM | POA: Diagnosis not present

## 2015-12-26 DIAGNOSIS — M545 Low back pain: Secondary | ICD-10-CM | POA: Insufficient documentation

## 2015-12-26 NOTE — Therapy (Signed)
[place], [place], [address] Phone: [phone]   Fax:  [phone]  Physical Therapy Treatment  Patient Details  Name: Christopher Baker MRN: [id number] Date of Birth: 25-Mar-1934 Referring Provider: Suella Broad Md.  Encounter Date: 12/26/2015      PT End of Session - 12/26/15 0854    Visit Number 2   Number of Visits 17   Date for PT Re-Evaluation 02/05/16   Authorization Type [employer]: Kx mod by 15th visit; progress note/ G code by 10th visit   PT Start Time 0813  pt arrived 13 minutes late today   PT Stop Time 0853   PT Time Calculation (min) 40 min   Activity Tolerance Patient tolerated treatment well   Behavior During Therapy [facility] for tasks assessed/performed      Past Medical History  Diagnosis Date  . Hyperlipidemia   . HTN (hypertension)   . Status post placement of cardiac pacemaker DDD-  06-04-10-- LAST CHECK 09-08-10 IN EPIC    SECONDARY TO SYNCOPY AND BRADYCARDIA  . Restless leg syndrome   . Carotid stenosis, bilateral PER DR EARLY / DUPLEX  09-09-10      40 - 59%   BILATERALLY--- ASYMPTOMATIC  . History of prostate cancer S/P RADIATION  [place]  6 YRS AGO  . Arthritis with psoriasis ([place])   . GERD (gastroesophageal reflux disease)     CONTROLLED W/ PROTONIX  . Borderline diabetes     DIET CONTROLLED - PT STATES HE IS NOT DIABETIC  . Lumbar spondylosis W/ RADICULOPATHY  . Iron deficiency   . Cataract immature LEFT EYE  . PAF (paroxysmal atrial fibrillation) ([place])   . SVT (supraventricular tachycardia) ([place])     S/P ABLATION   2008  . Sleep apnea TESTED YRS AGO-- NO CPAP RX GIVEN    PT STATES HE DOES NOT THINK HE STILL HAS SLEEP APNEA--DOES NOT USE CPAP  . Dysrhythmia     a-fib  . Stroke [employer] [facility]) Oct. 14, 2014    light left hand, balance issues, short term memory loss 2014  . Cancer [facility])     prostate  2008    Past Surgical History  Procedure Laterality Date  .  Replacement total knee  1997    RIGHT  . Cardiac pacemaker placement  06-04-10    DDD/ AND REMOVAL LOOR RECORDER  . Loop recorder placement  01-30-10  . Lumbar laminectomy/ diskectomy/ fusion  05-19-10    L4 - 5  . Cholecystectomy  2009  . Cardiac electrophysiology study and ablation  2008    FOR SVT  . Prostate biopsy  2007  . Transurethral resection of prostate  2006  . Inguinal hernia repair  2005    LEFT/   DONE WITH PENILE PROSTESIS SURG.  . Removal and placement penile prosthesis implant   2005    AND LEFT CORPOROPLASTY /    DONE INGUINAL REPAIR  . Penile prosthesis implant  1995  . Knee arthroscopy  06/02/2011    Procedure: ARTHROSCOPY KNEE;  Surgeon: Gearlean Alf;  Location: [facility];  Service: Orthopedics;  Laterality: Left;  LEFT KNEE ARTHROSCOPY WITH DEBRIDEMENT  . Total knee arthroplasty  12/20/2011    Procedure: TOTAL KNEE ARTHROPLASTY;  Surgeon: Gearlean Alf, MD;  [facility];  Service: Orthopedics;  Laterality: Left;  . Back surgery    . Joint replacement    . Spine surgery    . Esophagogastroduodenoscopy N/A 12/12/2014  Procedure: ESOPHAGOGASTRODUODENOSCOPY (EGD);  Surgeon: Wilford Corner, MD;  Location: [facility] ENDOSCOPY;  Service: Endoscopy;  Laterality: N/A;  . Total shoulder arthroplasty Right 06/27/2015    Procedure: REVERSE TOTAL SHOULDER ARTHROPLASTY;  Surgeon: Netta Cedars, MD;  Location: [place];  Service: Orthopedics;  Laterality: Right;    There were no vitals filed for this visit.      Subjective Assessment - 12/26/15 0815    Subjective "I feel like the back pain is getting better since the last session. States they spoke with their cardiologist about him being dizzy and they reduced his BP meds to 1/2 to see if it would reduce his dizziness with transition.   Pain Score 3    Pain Location Back   Pain Orientation Lower;Right   Pain Descriptors / Indicators Aching   Pain Type Chronic pain   Pain Onset More than a month ago    Pain Frequency Intermittent   Aggravating Factors  moving around, getting up and walking,    Pain Relieving Factors exercise and stretching            [facility] PT Assessment - 12/26/15 0001    Berg Balance Test   Sit to Stand Able to stand using hands after several tries   Standing Unsupported Able to stand 2 minutes with supervision   Sitting with Back Unsupported but Feet Supported on Floor or Stool Able to sit safely and securely 2 minutes   Stand to Sit Sits independently, has uncontrolled descent   Transfers Able to transfer with verbal cueing and /or supervision   Standing Unsupported with Eyes Closed Able to stand 10 seconds with supervision   Standing Ubsupported with Feet Together Able to place feet together independently and stand for 1 minute with supervision   From Standing, Reach Forward with Outstretched Arm Can reach forward >12 cm safely (5")   From Standing Position, Pick up Object from Floor Able to pick up shoe, needs supervision   From Standing Position, Turn to Look Behind Over each Shoulder Turn sideways only but maintains balance   Turn 360 Degrees Able to turn 360 degrees safely but slowly   Standing Unsupported, Alternately Place Feet on Step/Stool Able to complete >2 steps/needs minimal assist   Standing Unsupported, One Foot in [employer] balance while stepping or standing   Standing on One Leg Tries to lift leg/unable to hold 3 seconds but remains standing independently   Total Score 30                     [facility] Adult PT Treatment/Exercise - 12/26/15 0001    Lumbar Exercises: Seated   Sit to Stand 10 reps   Sit to Stand Limitations verbal cues and for proper form with nose over toes technique   Other Seated Lumbar Exercises hip scooting forward and backward 1 x 10                PT Education - 12/26/15 0852    Education provided Yes   Education Details based on [employer] findings pt would benefit with keeping his cane with him at  all times for safety due to being a high fall risk. proper mechanics of sit to stand to avoid using the table for support and to use nose of toes technique. HEP review due to pain not being consistent since evaluation.   Person(s) Educated Patient;Spouse   Methods Explanation;Verbal cues;Demonstration   Comprehension Verbalized understanding;Verbal cues required;Returned demonstration  PT Short Term Goals - 12/26/15 0856    PT SHORT TERM GOAL #1   Title pt will be I with inital HEP (01/10/2016)   Time 4   Period Weeks   Status On-going   PT SHORT TERM GOAL #2   Title pt will be able to verbalize and demonstrate proper posture and lifting and carrying techniques to prevent and reduce low back pain (01/10/2016)   Time 4   Period Weeks   Status On-going   PT SHORT TERM GOAL #3   Title pt will improve bil hip abductor/ extensor strength to >/= 4-/5 with >/= 2/10 pain in the low back to promote functional stability (01/10/2016)   Time 4   Period Weeks   Status On-going   PT SHORT TERM GOAL #4   Title berg balance testing will be performed and goals well be made   Time 2   Period Weeks   Status Achieved   PT SHORT TERM GOAL #5   Title pt will improve trunk mobility by >/= 5 degrees in all planes with </= 2/10 pain and to promote functional progression (01/10/2016)   Time 4   Period Weeks   Status On-going           PT Long Term Goals - 12/26/15 0857    PT LONG TERM GOAL #1   Title pt will be I with all HEP given as of last visit (02/05/2016)   Time 8   Period Weeks   Status On-going   PT LONG TERM GOAL #2   Title He will improve trunk flexion and side bending by >/= 10 degrees with </= 1/10 pain </= low guard with no report of feeling like he will fall to promote functional trunk mobility and safety during ADLS ([id number])   Time 8   Period Weeks   Status On-going   PT LONG TERM GOAL #3   Title pt will improve bil hip strength to >/= 4/5 to for functional endurance  with walking/ standing and promote safety ( 02/05/2016)   Time 8   Period Weeks   Status On-going   PT LONG TERM GOAL #4   Title pt will be able to walk/ stand for >/= 45 minutes with LRAD with </= 1/10 pain to assist with functional endruance for ADLs and assist with pt's goal of returning to playing golf (02/05/2016)   Time 8   Period Weeks   Status On-going   PT LONG TERM GOAL #5   Title at discharge pt with improve his FOTO score to >/= 72 to demonstrate improvement in function (02/05/2016)   Time 8   Period Weeks   Status On-going   Additional Long Term Goals   Additional Long Term Goals Yes   PT LONG TERM GOAL #6   Title pt will improve his Berg balance score to >/= 45/56 to promote safety with walking/ standing and help with pt's goal with returning to playing golf (02/05/2016)   Baseline inital sore 30/56   Time 8   Period Weeks   Status New               Plan - 12/26/15 0854    Clinical Impression Statement Mr. Hee reports he hasn't been as consistent with his HEP but reports his back pain is doing better possibly due to the injection he received from his MD. due to pt arriving late focused on assessing his balance, reviewing HEP and practicing proper sit to stand mechanics due  to pt utilizing the back of his kness on the table for stability. based on berg testing pt scored 30/56 indicating a high fall risk, educated that pt needs to use his SPC at all times which he demonstrated understanding.    PT Next Visit Plan assess/ review HEcore strengthening, balance training, LE strengthening, (HX OF CX: NO MHP OR E-STIM)   PT Home Exercise Plan reviewed HEP   Consulted and Agree with Plan of Care Patient;Family member/caregiver   Family Member Consulted wife      Patient will benefit from skilled therapeutic intervention in order to improve the following deficits and impairments:  Pain, Improper body mechanics, Postural dysfunction, Decreased balance, Decreased endurance,  Decreased activity tolerance, Difficulty walking, Decreased range of motion, Abnormal gait, Hypomobility, Increased fascial restricitons, Decreased strength, Decreased mobility  Visit Diagnosis: Bilateral low back pain, with sciatica presence unspecified  Muscle weakness (generalized)  Cramp and spasm  Unsteadiness on feet  Other abnormalities of gait and mobility     Problem List Patient Active Problem List   Diagnosis Date Noted  . S/P shoulder replacement 06/27/2015  . Residual cognitive deficit as late effect of stroke 04/15/2015  . Lung nodule   . Abnormal CXR 12/12/2014  . GI bleed 12/11/2014  . Weakness generalized 12/11/2014  . Leukocytosis 12/11/2014  . Restless leg syndrome 12/11/2014  . Anemia 12/11/2014  . Abnormal EKG 12/11/2014  . Gait disturbance, post-stroke 10/22/2014  . TIA (transient ischemic attack) 06/18/2014  . Partial seizures ([place]) 06/18/2014  . Seizure ([place]) 06/18/2014  . Vitamin B12 deficiency 06/18/2014  . OSA (obstructive sleep apnea) 02/26/2014  . Cognitive deficits as late effect of cerebrovascular disease 01/11/2014  . Fatigue 09/20/2013  . Flu-like symptoms 06/19/2013  . Intermittent lightheadedness 06/19/2013  . Low blood pressure, not hypotension 06/19/2013  . Influenza with respiratory manifestations 06/19/2013  . Embolic cerebral infarction ([place]) 04/09/2013  . CVA (cerebral infarction) 04/03/2013  . Occlusion and stenosis of carotid artery without mention of cerebral infarction 11/21/2012  . OA (osteoarthritis) of knee 12/20/2011  . Methotrexate, long term, current use 09/10/2011  . Knee pain, left 07/27/2011  . Pacemaker-Medtronic 09/08/2010  . UNSPECIFIED VISUAL DISTURBANCE 07/20/2010  . SYNCOPE 06/01/2010  . IRON DEFIC ANEMIA [place] DIET IRON INTAKE 04/20/2010  . PSORIASIS 04/20/2010  . HEADACHE, PERSISTENT 01/13/2010  . HEMATURIA UNSPECIFIED 11/19/2009  . PHLEBITIS AND THROMBOPHLEBITIS OF OTHER SITE 07/02/2009  . CELLULITIS,  LEG, LEFT 07/02/2009  . DISUSE OSTEOPOROSIS 05/12/2009  . HYPERGLYCEMIA, BORDERLINE 01/10/2009  . SPONDYLOSIS, LUMBAR, WITH RADICULOPATHY 10/15/2008  . OTHER SPECIFIED DERMATOMYCOSES 06/24/2008  . EXTRINSIC ASTHMA, WITH EXACERBATION 05/01/2008  . CALCU GALLBLADD W/OTH CHOLECYSTITIS&OBSTRUCTION 02/20/2008  . VARICOSE VEINS LOWER EXTREMITIES W/INFLAMMATION 12/25/2007  . EDEMA 12/25/2007  . INSOMNIA WITH SLEEP APNEA UNSPECIFIED 08/31/2007  . GERD 03/23/2007  . FIBRILLATION, ATRIAL 01/31/2007  . PSORIATIC ARTHRITIS 01/31/2007  . ABDOMINAL BLOATING 01/31/2007  . Hyperlipidemia 01/19/2007  . Essential hypertension 01/19/2007  . PROSTATE CANCER, HX OF 01/19/2007   Starr Lake PT, DPT, LAT, ATC  12/26/2015  9:02 AM      [facility] [facility] [address] [place], [place], [address] Phone: [phone]   Fax:  [phone]  Name: Christopher Baker MRN: [place]:[id number] Date of Birth: August 02, 1933

## 2015-12-29 ENCOUNTER — Ambulatory Visit: Payer: PPO | Admitting: Physical Therapy

## 2015-12-29 DIAGNOSIS — R2681 Unsteadiness on feet: Secondary | ICD-10-CM

## 2015-12-29 DIAGNOSIS — R2689 Other abnormalities of gait and mobility: Secondary | ICD-10-CM

## 2015-12-29 DIAGNOSIS — M545 Low back pain: Secondary | ICD-10-CM | POA: Diagnosis not present

## 2015-12-29 DIAGNOSIS — R252 Cramp and spasm: Secondary | ICD-10-CM

## 2015-12-29 DIAGNOSIS — M6281 Muscle weakness (generalized): Secondary | ICD-10-CM

## 2015-12-29 NOTE — Therapy (Signed)
Christopher Baker, [place], [address] Phone: [phone]   Fax:  [phone]  Physical Therapy Treatment  Patient Details  Name: Christopher Baker MRN: [id number] Date of Birth: 02-29-1934 Referring Provider: Suella Broad Md.  Encounter Date: 12/29/2015      PT End of Session - 12/29/15 1341    Visit Number 3   Number of Visits 17   Date for PT Re-Evaluation 02/05/16   Authorization Type [employer]: Kx mod by 15th visit; progress note/ G code by 10th visit   PT Start Time 1334   PT Stop Time 1418   PT Time Calculation (min) 44 min   Activity Tolerance Patient tolerated treatment well   Behavior During Therapy [facility] for tasks assessed/performed      Past Medical History  Diagnosis Date  . Hyperlipidemia   . HTN (hypertension)   . Status post placement of cardiac pacemaker DDD-  06-04-10-- LAST CHECK 09-08-10 IN EPIC    SECONDARY TO SYNCOPY AND BRADYCARDIA  . Restless leg syndrome   . Carotid stenosis, bilateral PER DR EARLY / DUPLEX  09-09-10      40 - 59%   BILATERALLY--- ASYMPTOMATIC  . History of prostate cancer S/P RADIATION  TX  6 YRS AGO  . Arthritis with psoriasis ([place])   . GERD (gastroesophageal reflux disease)     CONTROLLED W/ PROTONIX  . Borderline diabetes     DIET CONTROLLED - PT STATES HE IS NOT DIABETIC  . Lumbar spondylosis W/ RADICULOPATHY  . Iron deficiency   . Cataract immature LEFT EYE  . PAF (paroxysmal atrial fibrillation) ([place])   . SVT (supraventricular tachycardia) ([place])     S/P ABLATION   2008  . Sleep apnea TESTED YRS AGO-- NO CPAP RX GIVEN    PT STATES HE DOES NOT THINK HE STILL HAS SLEEP APNEA--DOES NOT USE CPAP  . Dysrhythmia     a-fib  . Stroke [facility]) Oct. 14, 2014    light left hand, balance issues, short term memory loss 2014  . Cancer [facility])     prostate  2008    Past Surgical History  Procedure Laterality Date  . Replacement total knee  1997    RIGHT  .  Cardiac pacemaker placement  06-04-10    DDD/ AND REMOVAL LOOR RECORDER  . Loop recorder placement  01-30-10  . Lumbar laminectomy/ diskectomy/ fusion  05-19-10    L4 - 5  . Cholecystectomy  2009  . Cardiac electrophysiology study and ablation  2008    FOR SVT  . Prostate biopsy  2007  . Transurethral resection of prostate  2006  . Inguinal hernia repair  2005    LEFT/   DONE WITH PENILE PROSTESIS SURG.  . Removal and placement penile prosthesis implant   2005    AND LEFT CORPOROPLASTY /    DONE INGUINAL REPAIR  . Penile prosthesis implant  1995  . Knee arthroscopy  06/02/2011    Procedure: ARTHROSCOPY KNEE;  Surgeon: Gearlean Alf;  Location: [place];  Service: Orthopedics;  Laterality: Left;  LEFT KNEE ARTHROSCOPY WITH DEBRIDEMENT  . Total knee arthroplasty  12/20/2011    Procedure: TOTAL KNEE ARTHROPLASTY;  Surgeon: Gearlean Alf, MD;  [facility];  Service: Orthopedics;  Laterality: Left;  . Back surgery    . Joint replacement    . Spine surgery    . Esophagogastroduodenoscopy N/A 12/12/2014    Procedure: ESOPHAGOGASTRODUODENOSCOPY (EGD);  Surgeon: Wilford Corner, MD;  Location: [facility] ENDOSCOPY;  Service: Endoscopy;  Laterality: N/A;  . Total shoulder arthroplasty Right 06/27/2015    Procedure: REVERSE TOTAL SHOULDER ARTHROPLASTY;  Surgeon: Netta Cedars, MD;  Location: [facility];  Service: Orthopedics;  Laterality: Right;    There were no vitals filed for this visit.      Subjective Assessment - 12/29/15 1335    Subjective "back is doing okay"    Currently in Pain? Yes   Pain Location Back   Pain Orientation Lower;Right   Pain Descriptors / Indicators Aching   Pain Type Chronic pain   Pain Onset More than a month ago   Pain Frequency Intermittent   Aggravating Factors  moving around, getting up/ walking   Pain Relieving Factors exercise and stretch                         [facility] Adult PT Treatment/Exercise - 12/29/15 1342    Lumbar  Exercises: Aerobic   Stationary Bike Nu-Step L 5 x 6 min   LE only   Lumbar Exercises: Standing   Heel Raises 2 seconds;15 reps   Lumbar Exercises: Seated   Sit to Stand --  2 x 8 with table lowered between sets   Sit to Stand Limitations verbal cues no to use table on back of legs   Other Seated Lumbar Exercises hip scooting forward and backward 1 x 10   Lumbar Exercises: Supine   Bent Knee Raise 20 reps;1 second   Bent Knee Raise Limitations verbal cues to keep core tight, and how to pull the belly button and activate the transverse abdominus   Bridge 10 reps  with glute set for 1 sec x 2 sets   Bridge Limitations keeping ball between knees  verbal cues keep core tight, and for proper breathing   Other Supine Lumbar Exercises clam shells 2 x 10   red theraband              Balance Exercises - 12/29/15 1412    Balance Exercises: Standing   Standing Eyes Opened Narrow base of support (BOS);Solid surface;Head turns;3 reps  10 head turns slowly    Tandem Stance Eyes open;2 reps;30 secs  modified tandem minimal postural sway           PT Education - 12/29/15 1423    Education provided Yes   Education Details HEP review   Person(s) Educated Patient   Methods Explanation   Comprehension Verbalized understanding          PT Short Term Goals - 12/26/15 0856    PT SHORT TERM GOAL #1   Title pt will be I with inital HEP (01/10/2016)   Time 4   Period Weeks   Status On-going   PT SHORT TERM GOAL #2   Title pt will be able to verbalize and demonstrate proper posture and lifting and carrying techniques to prevent and reduce low back pain (01/10/2016)   Time 4   Period Weeks   Status On-going   PT SHORT TERM GOAL #3   Title pt will improve bil hip abductor/ extensor strength to >/= 4-/5 with >/= 2/10 pain in the low back to promote functional stability (01/10/2016)   Time 4   Period Weeks   Status On-going   PT SHORT TERM GOAL #4   Title berg balance testing will be  performed and goals well be made   Time 2   Period Weeks  Status Achieved   PT SHORT TERM GOAL #5   Title pt will improve trunk mobility by >/= 5 degrees in all planes with </= 2/10 pain and to promote functional progression (01/10/2016)   Time 4   Period Weeks   Status On-going           PT Long Term Goals - 12/26/15 0857    PT LONG TERM GOAL #1   Title pt will be I with all HEP given as of last visit (02/05/2016)   Time 8   Period Weeks   Status On-going   PT LONG TERM GOAL #2   Title He will improve trunk flexion and side bending by >/= 10 degrees with </= 1/10 pain </= low guard with no report of feeling like he will fall to promote functional trunk mobility and safety during ADLS ([id number])   Time 8   Period Weeks   Status On-going   PT LONG TERM GOAL #3   Title pt will improve bil hip strength to >/= 4/5 to for functional endurance with walking/ standing and promote safety ( 02/05/2016)   Time 8   Period Weeks   Status On-going   PT LONG TERM GOAL #4   Title pt will be able to walk/ stand for >/= 45 minutes with LRAD with </= 1/10 pain to assist with functional endruance for ADLs and assist with pt's goal of returning to playing golf (02/05/2016)   Time 8   Period Weeks   Status On-going   PT LONG TERM GOAL #5   Title at discharge pt with improve his FOTO score to >/= 72 to demonstrate improvement in function (02/05/2016)   Time 8   Period Weeks   Status On-going   Additional Long Term Goals   Additional Long Term Goals Yes   PT LONG TERM GOAL #6   Title pt will improve his Berg balance score to >/= 45/56 to promote safety with walking/ standing and help with pt's goal with returning to playing golf (02/05/2016)   Baseline inital sore 30/56   Time 8   Period Weeks   Status New               Plan - 12/29/15 1421    Clinical Impression Statement Christopher Baker states he hasn't been as consistent with his HEP as he could be. Reviewed his HEP again. He was able  to do all exercises requiring verbal cues for form and proper breathing; pt did require rest breaks due to fatiguing quickly. balance training he demonstrated moderate postural sway with modified tandem with the LLE leading compared bil.    PT Next Visit Plan core strengthening, balance training, LE strengthening, (HX OF CX: NO MHP OR E-STIM)   Consulted and Agree with Plan of Care Patient      Patient will benefit from skilled therapeutic intervention in order to improve the following deficits and impairments:  Pain, Improper body mechanics, Postural dysfunction, Decreased balance, Decreased endurance, Decreased activity tolerance, Difficulty walking, Decreased range of motion, Abnormal gait, Hypomobility, Increased fascial restricitons, Decreased strength, Decreased mobility  Visit Diagnosis: Bilateral low back pain, with sciatica presence unspecified  Muscle weakness (generalized)  Cramp and spasm  Unsteadiness on feet  Other abnormalities of gait and mobility     Problem List Patient Active Problem List   Diagnosis Date Noted  . S/P shoulder replacement 06/27/2015  . Residual cognitive deficit as late effect of stroke 04/15/2015  . Lung nodule   .  Abnormal CXR 12/12/2014  . GI bleed 12/11/2014  . Weakness generalized 12/11/2014  . Leukocytosis 12/11/2014  . Restless leg syndrome 12/11/2014  . Anemia 12/11/2014  . Abnormal EKG 12/11/2014  . Gait disturbance, post-stroke 10/22/2014  . TIA (transient ischemic attack) 06/18/2014  . Partial seizures ([place]) 06/18/2014  . Seizure ([place]) 06/18/2014  . Vitamin B12 deficiency 06/18/2014  . OSA (obstructive sleep apnea) 02/26/2014  . Cognitive deficits as late effect of cerebrovascular disease 01/11/2014  . Fatigue 09/20/2013  . Flu-like symptoms 06/19/2013  . Intermittent lightheadedness 06/19/2013  . Low blood pressure, not hypotension 06/19/2013  . Influenza with respiratory manifestations 06/19/2013  . Embolic cerebral  infarction ([place]) 04/09/2013  . CVA (cerebral infarction) 04/03/2013  . Occlusion and stenosis of carotid artery without mention of cerebral infarction 11/21/2012  . OA (osteoarthritis) of knee 12/20/2011  . Methotrexate, long term, current use 09/10/2011  . Knee pain, left 07/27/2011  . Pacemaker-Medtronic 09/08/2010  . UNSPECIFIED VISUAL DISTURBANCE 07/20/2010  . SYNCOPE 06/01/2010  . IRON DEFIC ANEMIA [place] DIET IRON INTAKE 04/20/2010  . PSORIASIS 04/20/2010  . HEADACHE, PERSISTENT 01/13/2010  . HEMATURIA UNSPECIFIED 11/19/2009  . PHLEBITIS AND THROMBOPHLEBITIS OF OTHER SITE 07/02/2009  . CELLULITIS, LEG, LEFT 07/02/2009  . DISUSE OSTEOPOROSIS 05/12/2009  . HYPERGLYCEMIA, BORDERLINE 01/10/2009  . SPONDYLOSIS, LUMBAR, WITH RADICULOPATHY 10/15/2008  . OTHER SPECIFIED DERMATOMYCOSES 06/24/2008  . EXTRINSIC ASTHMA, WITH EXACERBATION 05/01/2008  . CALCU GALLBLADD W/OTH CHOLECYSTITIS&OBSTRUCTION 02/20/2008  . VARICOSE VEINS LOWER EXTREMITIES W/INFLAMMATION 12/25/2007  . EDEMA 12/25/2007  . INSOMNIA WITH SLEEP APNEA UNSPECIFIED 08/31/2007  . GERD 03/23/2007  . FIBRILLATION, ATRIAL 01/31/2007  . PSORIATIC ARTHRITIS 01/31/2007  . ABDOMINAL BLOATING 01/31/2007  . Hyperlipidemia 01/19/2007  . Essential hypertension 01/19/2007  . PROSTATE CANCER, HX OF 01/19/2007   Starr Lake PT, DPT, LAT, ATC  12/29/2015  2:24 PM      [facility] [address] [place], [place], [address] Phone: [phone]   Fax:  [phone]  Name: Christopher Baker MRN: [id number]:[id number] Date of Birth: 1934/01/21

## 2015-12-31 ENCOUNTER — Ambulatory Visit: Payer: PPO | Admitting: Physical Therapy

## 2015-12-31 DIAGNOSIS — R2689 Other abnormalities of gait and mobility: Secondary | ICD-10-CM

## 2015-12-31 DIAGNOSIS — R252 Cramp and spasm: Secondary | ICD-10-CM

## 2015-12-31 DIAGNOSIS — M545 Low back pain: Secondary | ICD-10-CM | POA: Diagnosis not present

## 2015-12-31 DIAGNOSIS — R2681 Unsteadiness on feet: Secondary | ICD-10-CM

## 2015-12-31 DIAGNOSIS — M6281 Muscle weakness (generalized): Secondary | ICD-10-CM

## 2015-12-31 NOTE — Therapy (Signed)
[place], [place], [address] Phone: [phone]   Fax:  [phone]  Physical Therapy Treatment  Patient Details  Name: Christopher Baker MRN: [id number] Date of Birth: [birth year] Referring Provider: Suella Broad Md.  Encounter Date: 12/31/2015      PT End of Session - 12/31/15 1424    Visit Number 4   Number of Visits 17   Date for PT Re-Evaluation 02/05/16   Authorization Type [employer]: Kx mod by 15th visit; progress note/ G code by 10th visit   PT Start Time 1330   PT Stop Time 1418   PT Time Calculation (min) 48 min   Activity Tolerance Patient tolerated treatment well   Behavior During Therapy [facility] for tasks assessed/performed      Past Medical History  Diagnosis Date  . Hyperlipidemia   . HTN (hypertension)   . Status post placement of cardiac pacemaker DDD-  06-04-10-- LAST CHECK 09-08-10 IN EPIC    SECONDARY TO SYNCOPY AND BRADYCARDIA  . Restless leg syndrome   . Carotid stenosis, bilateral PER DR EARLY / DUPLEX  09-09-10      40 - 59%   BILATERALLY--- ASYMPTOMATIC  . History of prostate cancer S/P RADIATION  [place]  6 YRS AGO  . Arthritis with psoriasis ([place])   . GERD (gastroesophageal reflux disease)     CONTROLLED W/ PROTONIX  . Borderline diabetes     DIET CONTROLLED - PT STATES HE IS NOT DIABETIC  . Lumbar spondylosis W/ RADICULOPATHY  . Iron deficiency   . Cataract immature LEFT EYE  . PAF (paroxysmal atrial fibrillation) ([place])   . SVT (supraventricular tachycardia) ([place])     S/P ABLATION   2008  . Sleep apnea TESTED YRS AGO-- NO CPAP RX GIVEN    PT STATES HE DOES NOT THINK HE STILL HAS SLEEP APNEA--DOES NOT USE CPAP  . Dysrhythmia     a-fib  . Stroke [facility] Oct. 14, 2014    light left hand, balance issues, short term memory loss 2014  . Cancer [facility])     prostate  2008    Past Surgical History  Procedure Laterality Date  . Replacement total knee  1997    RIGHT  .  Cardiac pacemaker placement  06-04-10    DDD/ AND REMOVAL LOOR RECORDER  . Loop recorder placement  01-30-10  . Lumbar laminectomy/ diskectomy/ fusion  05-19-10    L4 - 5  . Cholecystectomy  2009  . Cardiac electrophysiology study and ablation  2008    FOR SVT  . Prostate biopsy  2007  . Transurethral resection of prostate  2006  . Inguinal hernia repair  2005    LEFT/   DONE WITH PENILE PROSTESIS SURG.  . Removal and placement penile prosthesis implant   2005    AND LEFT CORPOROPLASTY /    DONE INGUINAL REPAIR  . Penile prosthesis implant  1995  . Knee arthroscopy  06/02/2011    Procedure: ARTHROSCOPY KNEE;  Surgeon: Gearlean Alf;  Location: [place];  Service: Orthopedics;  Laterality: Left;  LEFT KNEE ARTHROSCOPY WITH DEBRIDEMENT  . Total knee arthroplasty  12/20/2011    Procedure: TOTAL KNEE ARTHROPLASTY;  Surgeon: Gearlean Alf, MD;  Location: [facility];  Service: Orthopedics;  Laterality: Left;  . Back surgery    . Joint replacement    . Spine surgery    . Esophagogastroduodenoscopy N/A 12/12/2014    Procedure: ESOPHAGOGASTRODUODENOSCOPY (EGD);  Surgeon: Wilford Corner, MD;  Location: [facility] ENDOSCOPY;  Service: Endoscopy;  Laterality: N/A;  . Total shoulder arthroplasty Right 06/27/2015    Procedure: REVERSE TOTAL SHOULDER ARTHROPLASTY;  Surgeon: Netta Cedars, MD;  Location: [place];  Service: Orthopedics;  Laterality: Right;    There were no vitals filed for this visit.      Subjective Assessment - 12/31/15 1331    Subjective " I was alittle sore after the last session, but I am  doing pretty good today"    Currently in Pain? No/denies                         [facility] Adult PT Treatment/Exercise - 12/31/15 1337    Lumbar Exercises: Aerobic   Stationary Bike Nu-Step L 5 x 7 min  LE only   Lumbar Exercises: Standing   Other Standing Lumbar Exercises hip abduction 2 x 10 , extension 2 x 10 bil   Lumbar Exercises: Seated   Hip Flexion on Ball  AROM;Both;10 reps  seated on dyna disc   Sit to Stand 10 reps  x 2 set of 10 table lowered between sets   Sit to Stand Limitations verbal cues to try and catch regain balance instead of just sitting back down when he losses his balance.    Lumbar Exercises: Supine   Bent Knee Raise 20 reps;1 second   Bent Knee Raise Limitations verbal/ tactile cues for abdominal draw in manuever   Bridge 20 reps  2 sets   Straight Leg Raise 15 reps  3#, bil             Balance Exercises - 12/31/15 1413    Balance Exercises: Standing   Standing Eyes Opened Narrow base of support (BOS);Solid surface;Head turns;3 reps;Other (comment)  x10, mild postural sway   Standing Eyes Closed Narrow base of support (BOS);Solid surface;2 reps;30 secs  increased postural sway, required +1 min assist x1             PT Short Term Goals - 12/26/15 0856    PT SHORT TERM GOAL #1   Title pt will be I with inital HEP (01/10/2016)   Time 4   Period Weeks   Status On-going   PT SHORT TERM GOAL #2   Title pt will be able to verbalize and demonstrate proper posture and lifting and carrying techniques to prevent and reduce low back pain (01/10/2016)   Time 4   Period Weeks   Status On-going   PT SHORT TERM GOAL #3   Title pt will improve bil hip abductor/ extensor strength to >/= 4-/5 with >/= 2/10 pain in the low back to promote functional stability (01/10/2016)   Time 4   Period Weeks   Status On-going   PT SHORT TERM GOAL #4   Title berg balance testing will be performed and goals well be made   Time 2   Period Weeks   Status Achieved   PT SHORT TERM GOAL #5   Title pt will improve trunk mobility by >/= 5 degrees in all planes with </= 2/10 pain and to promote functional progression (01/10/2016)   Time 4   Period Weeks   Status On-going           PT Long Term Goals - 12/26/15 0857    PT LONG TERM GOAL #1   Title pt will be I with all HEP given as of last visit (02/05/2016)   Time 8  Period  Weeks   Status On-going   PT LONG TERM GOAL #2   Title He will improve trunk flexion and side bending by >/= 10 degrees with </= 1/10 pain </= low guard with no report of feeling like he will fall to promote functional trunk mobility and safety during ADLS ([id number])   Time 8   Period Weeks   Status On-going   PT LONG TERM GOAL #3   Title pt will improve bil hip strength to >/= 4/5 to for functional endurance with walking/ standing and promote safety ( 02/05/2016)   Time 8   Period Weeks   Status On-going   PT LONG TERM GOAL #4   Title pt will be able to walk/ stand for >/= 45 minutes with LRAD with </= 1/10 pain to assist with functional endruance for ADLs and assist with pt's goal of returning to playing golf (02/05/2016)   Time 8   Period Weeks   Status On-going   PT LONG TERM GOAL #5   Title at discharge pt with improve his FOTO score to >/= 72 to demonstrate improvement in function (02/05/2016)   Time 8   Period Weeks   Status On-going   Additional Long Term Goals   Additional Long Term Goals Yes   PT LONG TERM GOAL #6   Title pt will improve his Berg balance score to >/= 45/56 to promote safety with walking/ standing and help with pt's goal with returning to playing golf (02/05/2016)   Baseline inital sore 30/56   Time 8   Period Weeks   Status New               Plan - 12/31/15 1425    Clinical Impression Statement Mr. Macklin demonstrated improvement with rhomberg balance with head turns demonstrating decreased postural sway, advanced to eyes closed which pt required +1 min assist 1x to maintain balance. He peformed/complete all exercises with decreased fatigue. pt requires verbal cues to go slow with supine<>sit transitoions to avoid torquing the back and to decrease dizziness.    PT Next Visit Plan core strengthening, progress balance training PRN, LE strengthening, (HX OF CX: NO MHP OR E-STIM)   Consulted and Agree with Plan of Care Patient      Patient will  benefit from skilled therapeutic intervention in order to improve the following deficits and impairments:  Pain, Improper body mechanics, Postural dysfunction, Decreased balance, Decreased endurance, Decreased activity tolerance, Difficulty walking, Decreased range of motion, Abnormal gait, Hypomobility, Increased fascial restricitons, Decreased strength, Decreased mobility  Visit Diagnosis: Bilateral low back pain, with sciatica presence unspecified  Muscle weakness (generalized)  Cramp and spasm  Unsteadiness on feet  Other abnormalities of gait and mobility     Problem List Patient Active Problem List   Diagnosis Date Noted  . S/P shoulder replacement 06/27/2015  . Residual cognitive deficit as late effect of stroke 04/15/2015  . Lung nodule   . Abnormal CXR 12/12/2014  . GI bleed 12/11/2014  . Weakness generalized 12/11/2014  . Leukocytosis 12/11/2014  . Restless leg syndrome 12/11/2014  . Anemia 12/11/2014  . Abnormal EKG 12/11/2014  . Gait disturbance, post-stroke 10/22/2014  . TIA (transient ischemic attack) 06/18/2014  . Partial seizures ([place]) 06/18/2014  . Seizure ([place]) 06/18/2014  . Vitamin B12 deficiency 06/18/2014  . OSA (obstructive sleep apnea) 02/26/2014  . Cognitive deficits as late effect of cerebrovascular disease 01/11/2014  . Fatigue 09/20/2013  . Flu-like symptoms 06/19/2013  . Intermittent lightheadedness 06/19/2013  .  Low blood pressure, not hypotension 06/19/2013  . Influenza with respiratory manifestations 06/19/2013  . Embolic cerebral infarction ([place]) 04/09/2013  . CVA (cerebral infarction) 04/03/2013  . Occlusion and stenosis of carotid artery without mention of cerebral infarction 11/21/2012  . OA (osteoarthritis) of knee 12/20/2011  . Methotrexate, long term, current use 09/10/2011  . Knee pain, left 07/27/2011  . Pacemaker-Medtronic 09/08/2010  . UNSPECIFIED VISUAL DISTURBANCE 07/20/2010  . SYNCOPE 06/01/2010  . IRON DEFIC ANEMIA [place]  DIET IRON INTAKE 04/20/2010  . PSORIASIS 04/20/2010  . HEADACHE, PERSISTENT 01/13/2010  . HEMATURIA UNSPECIFIED 11/19/2009  . PHLEBITIS AND THROMBOPHLEBITIS OF OTHER SITE 07/02/2009  . CELLULITIS, LEG, LEFT 07/02/2009  . DISUSE OSTEOPOROSIS 05/12/2009  . HYPERGLYCEMIA, BORDERLINE 01/10/2009  . SPONDYLOSIS, LUMBAR, WITH RADICULOPATHY 10/15/2008  . OTHER SPECIFIED DERMATOMYCOSES 06/24/2008  . EXTRINSIC ASTHMA, WITH EXACERBATION 05/01/2008  . CALCU GALLBLADD W/OTH CHOLECYSTITIS&OBSTRUCTION 02/20/2008  . VARICOSE VEINS LOWER EXTREMITIES W/INFLAMMATION 12/25/2007  . EDEMA 12/25/2007  . INSOMNIA WITH SLEEP APNEA UNSPECIFIED 08/31/2007  . GERD 03/23/2007  . FIBRILLATION, ATRIAL 01/31/2007  . PSORIATIC ARTHRITIS 01/31/2007  . ABDOMINAL BLOATING 01/31/2007  . Hyperlipidemia 01/19/2007  . Essential hypertension 01/19/2007  . PROSTATE CANCER, HX OF 01/19/2007   Starr Lake PT, DPT, LAT, ATC  12/31/2015  2:30 PM      [facility] [address] [place], [place], [address] Phone: [phone]   Fax:  [phone]  Name: Christopher Baker MRN: [id number] Date of Birth: 12-Sep-1933

## 2016-01-02 ENCOUNTER — Ambulatory Visit: Payer: PPO | Admitting: Physical Therapy

## 2016-01-02 DIAGNOSIS — R2681 Unsteadiness on feet: Secondary | ICD-10-CM

## 2016-01-02 DIAGNOSIS — R2689 Other abnormalities of gait and mobility: Secondary | ICD-10-CM

## 2016-01-02 DIAGNOSIS — M6281 Muscle weakness (generalized): Secondary | ICD-10-CM

## 2016-01-02 DIAGNOSIS — M545 Low back pain: Secondary | ICD-10-CM | POA: Diagnosis not present

## 2016-01-02 DIAGNOSIS — R252 Cramp and spasm: Secondary | ICD-10-CM

## 2016-01-02 NOTE — Therapy (Signed)
[place], [place], [address] Phone: [phone]   Fax:  [phone]  Physical Therapy Treatment  Patient Details  Name: Christopher Baker MRN: [id number] Date of Birth: Nov 12, 1933 Referring Provider: Suella Broad Md.  Encounter Date: 01/02/2016      PT End of Session - 01/02/16 0850    Visit Number 5   Number of Visits 17   Date for PT Re-Evaluation 02/05/16   Authorization Type [employer]: Kx mod by 15th visit; progress note/ G code by 10th visit   PT Start Time 0845   PT Stop Time 0930   PT Time Calculation (min) 45 min      Past Medical History  Diagnosis Date  . Hyperlipidemia   . HTN (hypertension)   . Status post placement of cardiac pacemaker DDD-  06-04-10-- LAST CHECK 09-08-10 IN EPIC    SECONDARY TO SYNCOPY AND BRADYCARDIA  . Restless leg syndrome   . Carotid stenosis, bilateral PER DR EARLY / DUPLEX  09-09-10      40 - 59%   BILATERALLY--- ASYMPTOMATIC  . History of prostate cancer S/P RADIATION  TX  6 YRS AGO  . Arthritis with psoriasis ([place])   . GERD (gastroesophageal reflux disease)     CONTROLLED W/ PROTONIX  . Borderline diabetes     DIET CONTROLLED - PT STATES HE IS NOT DIABETIC  . Lumbar spondylosis W/ RADICULOPATHY  . Iron deficiency   . Cataract immature LEFT EYE  . PAF (paroxysmal atrial fibrillation) ([place])   . SVT (supraventricular tachycardia) ([place])     S/P ABLATION   2008  . Sleep apnea TESTED YRS AGO-- NO CPAP RX GIVEN    PT STATES HE DOES NOT THINK HE STILL HAS SLEEP APNEA--DOES NOT USE CPAP  . Dysrhythmia     a-fib  . Stroke [facility]) Oct. 14, 2014    light left hand, balance issues, short term memory loss 2014  . Cancer [facility])     prostate  2008    Past Surgical History  Procedure Laterality Date  . Replacement total knee  1997    RIGHT  . Cardiac pacemaker placement  06-04-10    DDD/ AND REMOVAL LOOR RECORDER  . Loop recorder placement  01-30-10  .  Lumbar laminectomy/ diskectomy/ fusion  05-19-10    L4 - 5  . Cholecystectomy  2009  . Cardiac electrophysiology study and ablation  2008    FOR SVT  . Prostate biopsy  2007  . Transurethral resection of prostate  2006  . Inguinal hernia repair  2005    LEFT/   DONE WITH PENILE PROSTESIS SURG.  . Removal and placement penile prosthesis implant   2005    AND LEFT CORPOROPLASTY /    DONE INGUINAL REPAIR  . Penile prosthesis implant  1995  . Knee arthroscopy  06/02/2011    Procedure: ARTHROSCOPY KNEE;  Surgeon: Gearlean Alf;  Location: [place];  Service: Orthopedics;  Laterality: Left;  LEFT KNEE ARTHROSCOPY WITH DEBRIDEMENT  . Total knee arthroplasty  12/20/2011    Procedure: TOTAL KNEE ARTHROPLASTY;  Surgeon: Gearlean Alf, MD;  [facility];  Service: Orthopedics;  Laterality: Left;  . Back surgery    . Joint replacement    . Spine surgery    . Esophagogastroduodenoscopy N/A 12/12/2014    Procedure: ESOPHAGOGASTRODUODENOSCOPY (EGD);  Surgeon: Wilford Corner, MD;  Location: [facility] ENDOSCOPY;  Service: Endoscopy;  Laterality: N/A;  . Total  shoulder arthroplasty Right 06/27/2015    Procedure: REVERSE TOTAL SHOULDER ARTHROPLASTY;  Surgeon: Netta Cedars, MD;  Location: [place];  Service: Orthopedics;  Laterality: Right;    There were no vitals filed for this visit.          [facility] PT Assessment - 01/02/16 0001    Precautions   Precautions ICD/Pacemaker                     [facility] Adult PT Treatment/Exercise - 01/02/16 0001    High Level Balance   High Level Balance Activities Side stepping;Turns;Direction changes;Backward walking   High Level Balance Comments rhomberg with head turns and trunk rotations , tandem balance multiple trials. standing on foam normal and narrow balance with head turns and trunk rotations. marching on foam.    Lumbar Exercises: Aerobic   Stationary Bike Nu-Step L 5 x 7 min  LE only   Lumbar Exercises: Standing   Other  Standing Lumbar Exercises hip abduction 2 x 10 , extension 2 x 10 bil, single march 10 x 2 each   Lumbar Exercises: Seated   Sit to Stand 20 reps  2 sets from elevated seat                  PT Short Term Goals - 12/26/15 [id number]    PT SHORT TERM GOAL #1   Title pt will be I with inital HEP (01/10/2016)   Time 4   Period Weeks   Status On-going   PT SHORT TERM GOAL #2   Title pt will be able to verbalize and demonstrate proper posture and lifting and carrying techniques to prevent and reduce low back pain (01/10/2016)   Time 4   Period Weeks   Status On-going   PT SHORT TERM GOAL #3   Title pt will improve bil hip abductor/ extensor strength to >/= 4-/5 with >/= 2/10 pain in the low back to promote functional stability (01/10/2016)   Time 4   Period Weeks   Status On-going   PT SHORT TERM GOAL #4   Title berg balance testing will be performed and goals well be made   Time 2   Period Weeks   Status Achieved   PT SHORT TERM GOAL #5   Title pt will improve trunk mobility by >/= 5 degrees in all planes with </= 2/10 pain and to promote functional progression (01/10/2016)   Time 4   Period Weeks   Status On-going           PT Long Term Goals - 12/26/15 0857    PT LONG TERM GOAL #1   Title pt will be I with all HEP given as of last visit (02/05/2016)   Time 8   Period Weeks   Status On-going   PT LONG TERM GOAL #2   Title He will improve trunk flexion and side bending by >/= 10 degrees with </= 1/10 pain </= low guard with no report of feeling like he will fall to promote functional trunk mobility and safety during ADLS ([id number])   Time 8   Period Weeks   Status On-going   PT LONG TERM GOAL #3   Title pt will improve bil hip strength to >/= 4/5 to for functional endurance with walking/ standing and promote safety ( 02/05/2016)   Time 8   Period Weeks   Status On-going   PT LONG TERM GOAL #4   Title pt will be able to walk/ stand  for >/= 45 minutes with LRAD with  </= 1/10 pain to assist with functional endruance for ADLs and assist with pt's goal of returning to playing golf (02/05/2016)   Time 8   Period Weeks   Status On-going   PT LONG TERM GOAL #5   Title at discharge pt with improve his FOTO score to >/= 72 to demonstrate improvement in function (02/05/2016)   Time 8   Period Weeks   Status On-going   Additional Long Term Goals   Additional Long Term Goals Yes   PT LONG TERM GOAL #6   Title pt will improve his Berg balance score to >/= 45/56 to promote safety with walking/ standing and help with pt's goal with returning to playing golf (02/05/2016)   Baseline inital sore 30/56   Time 8   Period Weeks   Status New               Plan - 01/02/16 0935    Clinical Impression Statement Pt requested to focus balance today. We spent the treatment in parallel bars challenging pt's static and dynamic balance. Added foam pad for rhomberg stance and pertubations with pt requiring mutiple episodes of UE assist  to recover from LOB . After Nustep warm up pt attempting sit-stand and lost his balance backward landing back in the Nustep seat. Educated on proper form for sit to stand to ensure he does not loose his balance. Also worked on Chief Technology Officer where pt tends to use left knee more than right. Reversed foot placement to encourage more weight through RLE. Pt reports fatigue, no pain at end of treatment.    PT Next Visit Plan core strengthening, progress balance training PRN, LE strengthening, (HX OF CX and PACEMAKER : NO MHP OR E-STIM)      Patient will benefit from skilled therapeutic intervention in order to improve the following deficits and impairments:  Pain, Improper body mechanics, Postural dysfunction, Decreased balance, Decreased endurance, Decreased activity tolerance, Difficulty walking, Decreased range of motion, Abnormal gait, Hypomobility, Increased fascial restricitons, Decreased strength, Decreased mobility  Visit  Diagnosis: Bilateral low back pain, with sciatica presence unspecified  Muscle weakness (generalized)  Cramp and spasm  Other abnormalities of gait and mobility  Unsteadiness on feet     Problem List Patient Active Problem List   Diagnosis Date Noted  . S/P shoulder replacement 06/27/2015  . Residual cognitive deficit as late effect of stroke 04/15/2015  . Lung nodule   . Abnormal CXR 12/12/2014  . GI bleed 12/11/2014  . Weakness generalized 12/11/2014  . Leukocytosis 12/11/2014  . Restless leg syndrome 12/11/2014  . Anemia 12/11/2014  . Abnormal EKG 12/11/2014  . Gait disturbance, post-stroke 10/22/2014  . TIA (transient ischemic attack) 06/18/2014  . Partial seizures ([place]) 06/18/2014  . Seizure ([place]) 06/18/2014  . Vitamin B12 deficiency 06/18/2014  . OSA (obstructive sleep apnea) 02/26/2014  . Cognitive deficits as late effect of cerebrovascular disease 01/11/2014  . Fatigue 09/20/2013  . Flu-like symptoms 06/19/2013  . Intermittent lightheadedness 06/19/2013  . Low blood pressure, not hypotension 06/19/2013  . Influenza with respiratory manifestations 06/19/2013  . Embolic cerebral infarction ([place]) 04/09/2013  . CVA (cerebral infarction) 04/03/2013  . Occlusion and stenosis of carotid artery without mention of cerebral infarction 11/21/2012  . OA (osteoarthritis) of knee 12/20/2011  . Methotrexate, long term, current use 09/10/2011  . Knee pain, left 07/27/2011  . Pacemaker-Medtronic 09/08/2010  . UNSPECIFIED VISUAL DISTURBANCE 07/20/2010  . SYNCOPE 06/01/2010  . IRON  [place] DIET IRON INTAKE 04/20/2010  . PSORIASIS 04/20/2010  . HEADACHE, PERSISTENT 01/13/2010  . HEMATURIA UNSPECIFIED 11/19/2009  . PHLEBITIS AND THROMBOPHLEBITIS OF OTHER SITE 07/02/2009  . CELLULITIS, LEG, LEFT 07/02/2009  . DISUSE OSTEOPOROSIS 05/12/2009  . HYPERGLYCEMIA, BORDERLINE 01/10/2009  . SPONDYLOSIS, LUMBAR, WITH RADICULOPATHY 10/15/2008  . OTHER SPECIFIED DERMATOMYCOSES  06/24/2008  . EXTRINSIC ASTHMA, WITH EXACERBATION 05/01/2008  . CALCU GALLBLADD W/OTH CHOLECYSTITIS&OBSTRUCTION 02/20/2008  . VARICOSE VEINS LOWER EXTREMITIES W/INFLAMMATION 12/25/2007  . EDEMA 12/25/2007  . INSOMNIA WITH SLEEP APNEA UNSPECIFIED 08/31/2007  . GERD 03/23/2007  . FIBRILLATION, ATRIAL 01/31/2007  . PSORIATIC ARTHRITIS 01/31/2007  . ABDOMINAL BLOATING 01/31/2007  . Hyperlipidemia 01/19/2007  . Essential hypertension 01/19/2007  . PROSTATE CANCER, HX OF 01/19/2007    Dorene Ar, PTA 01/02/2016, 9:49 AM  [place], [place], [address] Phone: [phone]   Fax:  [phone]  Name: ARIYON BRUNEY MRN: [id number] Date of Birth: 04-[name]-1934

## 2016-01-05 ENCOUNTER — Ambulatory Visit: Payer: PPO | Admitting: Physical Therapy

## 2016-01-05 NOTE — Therapy (Signed)
[place], [place], [address] Phone: [phone]   Fax:  [phone]  Physical Therapy Treatment  Patient Details  Name: Christopher Baker MRN: [id number] Date of Birth: 05-Dec-1933 Referring Provider: Suella Broad Md.  Encounter Date: 01/05/2016      PT End of Session - 01/05/16 1353    Visit Number 6   Number of Visits 17   Date for PT Re-Evaluation 02/05/16   Authorization Type [employer]: Kx mod by 15th visit; progress note/ G code by 10th visit   PT Start Time 1346  pt arrived 10 minutes late then spent time in the bathrom   PT Stop Time 1412  due to pt arriving late 10 minutes late  and multiple trips to the bathroom  with > 20 mindecided to reschedule today   PT Time Calculation (min) 26 min   Activity Tolerance Patient tolerated treatment well   Behavior During Therapy [facility] for tasks assessed/performed      Past Medical History  Diagnosis Date  . Hyperlipidemia   . HTN (hypertension)   . Status post placement of cardiac pacemaker DDD-  06-04-10-- LAST CHECK 09-08-10 IN EPIC    SECONDARY TO SYNCOPY AND BRADYCARDIA  . Restless leg syndrome   . Carotid stenosis, bilateral PER DR EARLY / DUPLEX  09-09-10      40 - 59%   BILATERALLY--- ASYMPTOMATIC  . History of prostate cancer S/P RADIATION  [place]  6 YRS AGO  . Arthritis with psoriasis ([place])   . GERD (gastroesophageal reflux disease)     CONTROLLED W/ PROTONIX  . Borderline diabetes     DIET CONTROLLED - PT STATES HE IS NOT DIABETIC  . Lumbar spondylosis W/ RADICULOPATHY  . Iron deficiency   . Cataract immature LEFT EYE  . PAF (paroxysmal atrial fibrillation) ([place])   . SVT (supraventricular tachycardia) ([place])     S/P ABLATION   2008  . Sleep apnea TESTED YRS AGO-- NO CPAP RX GIVEN    PT STATES HE DOES NOT THINK HE STILL HAS SLEEP APNEA--DOES NOT USE CPAP  . Dysrhythmia     a-fib  . Stroke [facility]) Oct. 14, 2014    light left hand,  balance issues, short term memory loss 2014  . Cancer [facility])     prostate  2008    Past Surgical History  Procedure Laterality Date  . Replacement total knee  1997    RIGHT  . Cardiac pacemaker placement  06-04-10    DDD/ AND REMOVAL LOOR RECORDER  . Loop recorder placement  01-30-10  . Lumbar laminectomy/ diskectomy/ fusion  05-19-10    L4 - 5  . Cholecystectomy  2009  . Cardiac electrophysiology study and ablation  2008    FOR SVT  . Prostate biopsy  2007  . Transurethral resection of prostate  2006  . Inguinal hernia repair  2005    LEFT/   DONE WITH PENILE PROSTESIS SURG.  . Removal and placement penile prosthesis implant   2005    AND LEFT CORPOROPLASTY /    DONE INGUINAL REPAIR  . Penile prosthesis implant  1995  . Knee arthroscopy  06/02/2011    Procedure: ARTHROSCOPY KNEE;  Surgeon: Gearlean Alf;  Location: [place];  Service: Orthopedics;  Laterality: Left;  LEFT KNEE ARTHROSCOPY WITH DEBRIDEMENT  . Total knee arthroplasty  12/20/2011    Procedure: TOTAL KNEE ARTHROPLASTY;  Surgeon: Gearlean Alf, MD;  [facility];  Service: Orthopedics;  Laterality: Left;  . Back surgery    . Joint replacement    . Spine surgery    . Esophagogastroduodenoscopy N/A 12/12/2014    Procedure: ESOPHAGOGASTRODUODENOSCOPY (EGD);  Surgeon: Wilford Corner, MD;  Location[facility] END[facility]COPY;  Service: Endoscopy;  Laterality: N/A;  . Total shoulder arthroplasty Right 06/27/2015    Procedure: REVERSE TOTAL SHOULDER ARTHROPLASTY;  Surgeon: Netta Cedars, MD;  Location: [place];  Service: Orthopedics;  Laterality: Right;    There were no vitals filed for this visit.      Subjective Assessment - 01/05/16 1409    Subjective "I am a little sore today rated at 3/10   Currently in Pain? Yes   Pain Score 3    Pain Location Back   Pain Orientation Lower;Right   Pain Descriptors / Indicators Aching   Pain Type Chronic pain   Pain Frequency Intermittent   Aggravating Factors   twisting   Pain Relieving Factors stretching and exercise                         [facility] Adult PT Treatment/Exercise - 01/05/16 1353    Lumbar Exercises: Aerobic   Stationary Bike Nu-Step L6 x 4 min   Lumbar Exercises: Standing   Other Standing Lumbar Exercises --                  PT Short Term Goals - 12/26/15 0856    PT SHORT TERM GOAL #1   Title pt will be I with inital HEP (01/10/2016)   Time 4   Period Weeks   Status On-going   PT SHORT TERM GOAL #2   Title pt will be able to verbalize and demonstrate proper posture and lifting and carrying techniques to prevent and reduce low back pain (01/10/2016)   Time 4   Period Weeks   Status On-going   PT SHORT TERM GOAL #3   Title pt will improve bil hip abductor/ extensor strength to >/= 4-/5 with >/= 2/10 pain in the low back to promote functional stability (01/10/2016)   Time 4   Period Weeks   Status On-going   PT SHORT TERM GOAL #4   Title berg balance testing will be performed and goals well be made   Time 2   Period Weeks   Status Achieved   PT SHORT TERM GOAL #5   Title pt will improve trunk mobility by >/= 5 degrees in all planes with </= 2/10 pain and to promote functional progression (01/10/2016)   Time 4   Period Weeks   Status On-going           PT Long Term Goals - 12/26/15 0857    PT LONG TERM GOAL #1   Title pt will be I with all HEP given as of last visit (02/05/2016)   Time 8   Period Weeks   Status On-going   PT LONG TERM GOAL #2   Title He will improve trunk flexion and side bending by >/= 10 degrees with </= 1/10 pain </= low guard with no report of feeling like he will fall to promote functional trunk mobility and safety during ADLS ([id number])   Time 8   Period Weeks   Status On-going   PT LONG TERM GOAL #3   Title pt will improve bil hip strength to >/= 4/5 to for functional endurance with walking/ standing and promote safety ( 02/05/2016)   Time 8   Period  Weeks    Status On-going   PT LONG TERM GOAL #4   Title pt will be able to walk/ stand for >/= 45 minutes with LRAD with </= 1/10 pain to assist with functional endruance for ADLs and assist with pt's goal of returning to playing golf (02/05/2016)   Time 8   Period Weeks   Status On-going   PT LONG TERM GOAL #5   Title at discharge pt with improve his FOTO score to >/= 72 to demonstrate improvement in function (02/05/2016)   Time 8   Period Weeks   Status On-going   Additional Long Term Goals   Additional Long Term Goals Yes   PT LONG TERM GOAL #6   Title pt will improve his Berg balance score to >/= 45/56 to promote safety with walking/ standing and help with pt's goal with returning to playing golf (02/05/2016)   Baseline inital sore 30/56   Time 8   Period Weeks   Status New               Plan - 01/05/16 1413    Clinical Impression Statement pt arrived 10 minutes late then took multiple trips to the bathroom accounting for >/= 20 minutes. pt report his stomach was feeling alittle off. based on pt missing most of todays visit from running late and multiple trips to the bathroom decided to reschedule to another visit.       Patient will benefit from skilled therapeutic intervention in order to improve the following deficits and impairments:  Pain, Improper body mechanics, Postural dysfunction, Decreased balance, Decreased endurance, Decreased activity tolerance, Difficulty walking, Decreased range of motion, Abnormal gait, Hypomobility, Increased fascial restricitons, Decreased strength, Decreased mobility  Visit Diagnosis: Bilateral low back pain, with sciatica presence unspecified  Muscle weakness (generalized)  Cramp and spasm  Other abnormalities of gait and mobility  Unsteadiness on feet     Problem List Patient Active Problem List   Diagnosis Date Noted  . S/P shoulder replacement 06/27/2015  . Residual cognitive deficit as late effect of stroke 04/15/2015  . Lung  nodule   . Abnormal CXR 12/12/2014  . GI bleed 12/11/2014  . Weakness generalized 12/11/2014  . Leukocytosis 12/11/2014  . Restless leg syndrome 12/11/2014  . Anemia 12/11/2014  . Abnormal EKG 12/11/2014  . Gait disturbance, post-stroke 10/22/2014  . TIA (transient ischemic attack) 06/18/2014  . Partial seizures ([place]) 06/18/2014  . Seizure ([place]) 06/18/2014  . Vitamin B12 deficiency 06/18/2014  . OSA (obstructive sleep apnea) 02/26/2014  . Cognitive deficits as late effect of cerebrovascular disease 01/11/2014  . Fatigue 09/20/2013  . Flu-like symptoms 06/19/2013  . Intermittent lightheadedness 06/19/2013  . Low blood pressure, not hypotension 06/19/2013  . Influenza with respiratory manifestations 06/19/2013  . Embolic cerebral infarction ([place]) 04/09/2013  . CVA (cerebral infarction) 04/03/2013  . Occlusion and stenosis of carotid artery without mention of cerebral infarction 11/21/2012  . OA (osteoarthritis) of knee 12/20/2011  . Methotrexate, long term, current use 09/10/2011  . Knee pain, left 07/27/2011  . Pacemaker-Medtronic 09/08/2010  . UNSPECIFIED VISUAL DISTURBANCE 07/20/2010  . SYNCOPE 06/01/2010  . IRON DEFIC ANEMIA [place] DIET IRON INTAKE 04/20/2010  . PSORIASIS 04/20/2010  . HEADACHE, PERSISTENT 01/13/2010  . HEMATURIA UNSPECIFIED 11/19/2009  . PHLEBITIS AND THROMBOPHLEBITIS OF OTHER SITE 07/02/2009  . CELLULITIS, LEG, LEFT 07/02/2009  . DISUSE OSTEOPOROSIS 05/12/2009  . HYPERGLYCEMIA, BORDERLINE 01/10/2009  . SPONDYLOSIS, LUMBAR, WITH RADICULOPATHY 10/15/2008  . OTHER SPECIFIED DERMATOMYCOSES 06/24/2008  . EXTRINSIC  ASTHMA, WITH EXACERBATION 05/01/2008  . CALCU GALLBLADD W/OTH CHOLECYSTITIS&OBSTRUCTION 02/20/2008  . VARICOSE VEINS LOWER EXTREMITIES W/INFLAMMATION 12/25/2007  . EDEMA 12/25/2007  . INSOMNIA WITH SLEEP APNEA UNSPECIFIED 08/31/2007  . GERD 03/23/2007  . FIBRILLATION, ATRIAL 01/31/2007  . PSORIATIC ARTHRITIS 01/31/2007  . ABDOMINAL BLOATING  01/31/2007  . Hyperlipidemia 01/19/2007  . Essential hypertension 01/19/2007  . PROSTATE CANCER, HX OF 01/19/2007   Starr Lake PT, DPT, LAT, ATC  01/05/2016  2:19 PM      [facility] [address] [place], [place], [address] Phone: [phone]   Fax:  [phone]  Name: Christopher Baker MRN: [id number] Date of Birth: Apr 24, 1934

## 2016-01-07 ENCOUNTER — Telehealth: Payer: Self-pay | Admitting: Physical Therapy

## 2016-01-07 ENCOUNTER — Ambulatory Visit: Payer: PPO | Admitting: Physical Therapy

## 2016-01-07 NOTE — Telephone Encounter (Signed)
LVM regarding missing 1:30 appointment time and remind that he has another appointment on Friday at 8:45. If he cannot make it to call us let us know so we can cancel his appointment.

## 2016-01-09 ENCOUNTER — Ambulatory Visit: Payer: PPO | Admitting: Physical Therapy

## 2016-01-09 DIAGNOSIS — M545 Low back pain: Secondary | ICD-10-CM

## 2016-01-09 DIAGNOSIS — R2689 Other abnormalities of gait and mobility: Secondary | ICD-10-CM

## 2016-01-09 DIAGNOSIS — M6281 Muscle weakness (generalized): Secondary | ICD-10-CM

## 2016-01-09 DIAGNOSIS — R252 Cramp and spasm: Secondary | ICD-10-CM

## 2016-01-09 DIAGNOSIS — R2681 Unsteadiness on feet: Secondary | ICD-10-CM

## 2016-01-09 NOTE — Therapy (Signed)
[place], [place], [address] Phone: [phone]   Fax:  [phone]  Physical Therapy Treatment  Patient Details  Name: Christopher Baker MRN: [id number] Date of Birth: 1933/11/02 Referring Provider: Suella Broad Md.  Encounter Date: 01/09/2016      PT End of Session - 01/09/16 0853    Visit Number 7   Number of Visits 17   Date for PT Re-Evaluation 02/05/16   Authorization Type [employer]: Kx mod by 15th visit; progress note/ G code by 10th visit   PT Start Time 0845   PT Stop Time 0927   PT Time Calculation (min) 42 min      Past Medical History  Diagnosis Date  . Hyperlipidemia   . HTN (hypertension)   . Status post placement of cardiac pacemaker DDD-  06-04-10-- LAST CHECK 09-08-10 IN EPIC    SECONDARY TO SYNCOPY AND BRADYCARDIA  . Restless leg syndrome   . Carotid stenosis, bilateral PER DR EARLY / DUPLEX  09-09-10      40 - 59%   BILATERALLY--- ASYMPTOMATIC  . History of prostate cancer S/P RADIATION  [place]  6 YRS AGO  . Arthritis with psoriasis ([place])   . GERD (gastroesophageal reflux disease)     CONTROLLED W/ PROTONIX  . Borderline diabetes     DIET CONTROLLED - PT STATES HE IS NOT DIABETIC  . Lumbar spondylosis W/ RADICULOPATHY  . Iron deficiency   . Cataract immature LEFT EYE  . PAF (paroxysmal atrial fibrillation) ([place])   . SVT (supraventricular tachycardia) ([place])     S/P ABLATION   2008  . Sleep apnea TESTED YRS AGO-- NO CPAP RX GIVEN    PT STATES HE DOES NOT THINK HE STILL HAS SLEEP APNEA--DOES NOT USE CPAP  . Dysrhythmia     a-fib  . Stroke [facility]) Oct. 14, 2014    light left hand, balance issues, short term memory loss 2014  . Cancer [facility])     prostate  2008    Past Surgical History  Procedure Laterality Date  . Replacement total knee  1997    RIGHT  . Cardiac pacemaker placement  06-04-10    DDD/ AND REMOVAL LOOR RECORDER  . Loop recorder placement  01-30-10  .  Lumbar laminectomy/ diskectomy/ fusion  05-19-10    L4 - 5  . Cholecystectomy  2009  . Cardiac electrophysiology study and ablation  2008    FOR SVT  . Prostate biopsy  2007  . Transurethral resection of prostate  2006  . Inguinal hernia repair  2005    LEFT/   DONE WITH PENILE PROSTESIS SURG.  . Removal and placement penile prosthesis implant   2005    AND LEFT CORPOROPLASTY /    DONE INGUINAL REPAIR  . Penile prosthesis implant  1995  . Knee arthroscopy  06/02/2011    Procedure: ARTHROSCOPY KNEE;  Surgeon: Gearlean Alf;  Location: [place];  Service: Orthopedics;  Laterality: Left;  LEFT KNEE ARTHROSCOPY WITH DEBRIDEMENT  . Total knee arthroplasty  12/20/2011    Procedure: TOTAL KNEE ARTHROPLASTY;  Surgeon: Gearlean Alf, MD;  [facility];  Service: Orthopedics;  Laterality: Left;  . Back surgery    . Joint replacement    . Spine surgery    . Esophagogastroduodenoscopy N/A 12/12/2014    Procedure: ESOPHAGOGASTRODUODENOSCOPY (EGD);  Surgeon: Wilford Corner, MD;  Location: [facility] ENDOSCOPY;  Service: Endoscopy;  Laterality: N/A;  . Total  shoulder arthroplasty Right 06/27/2015    Procedure: REVERSE TOTAL SHOULDER ARTHROPLASTY;  Surgeon: Netta Cedars, MD;  Location: [place];  Service: Orthopedics;  Laterality: Right;    There were no vitals filed for this visit.      Subjective Assessment - 01/09/16 0949    Currently in Pain? No/denies            [facility] PT Assessment - 01/09/16 0001    Strength   Right Hip Flexion 4-/5   Right Hip ABduction 4/5   Left Hip Flexion 4-/5   Left Hip ABduction 4-/5                     [facility] Adult PT Treatment/Exercise - 01/09/16 0001    Lumbar Exercises: Aerobic   Stationary Bike Nustep L5 x 8 minutes   Lumbar Exercises: Seated   Sit to Stand 10 reps  2 sets from mat    Sit to Stand Limitations CGA/min assist for occassional LOB posterior after standing, or unable to stand up completely   Lumbar Exercises:  Supine   Bent Knee Raise 20 reps;1 second   Bridge 20 reps  2 sets   Straight Leg Raise 20 reps  2 x 10   Other Supine Lumbar Exercises clam shells 2 x 10   green theraband              Balance Exercises - 01/09/16 0927    Balance Exercises: Standing   Standing Eyes Opened Head turns;Solid surface;Other reps (comment);Narrow base of support (BOS)  and trunk rotations    Tandem Stance Eyes open;10 secs  each leg back, no UE   SLS Eyes open;Upper extremity support 1;1 rep;30 secs   Other Standing Exercises Alternating and unilateral step taps 6 inch step             PT Short Term Goals - 01/09/16 0945    PT SHORT TERM GOAL #1   Title pt will be I with inital HEP (01/10/2016)   Time 4   Period Weeks   Status Achieved   PT SHORT TERM GOAL #2   Title pt will be able to verbalize and demonstrate proper posture and lifting and carrying techniques to prevent and reduce low back pain (01/10/2016)   Time 4   Period Weeks   Status On-going   PT SHORT TERM GOAL #3   Title pt will improve bil hip abductor/ extensor strength to >/= 4-/5 with >/= 2/10 pain in the low back to promote functional stability (01/10/2016)   Time 4   Period Weeks   Status Partially Met   PT SHORT TERM GOAL #4   Title berg balance testing will be performed and goals well be made   Time 2   Period Weeks   Status Achieved   PT SHORT TERM GOAL #5   Title pt will improve trunk mobility by >/= 5 degrees in all planes with </= 2/10 pain and to promote functional progression (01/10/2016)   Time 4   Period Weeks   Status Unable to assess           PT Long Term Goals - 12/26/15 0857    PT LONG TERM GOAL #1   Title pt will be I with all HEP given as of last visit (02/05/2016)   Time 8   Period Weeks   Status On-going   PT LONG TERM GOAL #2   Title He will improve trunk flexion and side bending by >/=  10 degrees with </= 1/10 pain </= low guard with no report of feeling like he will fall to promote  functional trunk mobility and safety during ADLS (10/22/5463)   Time 8   Period Weeks   Status On-going   PT LONG TERM GOAL #3   Title pt will improve bil hip strength to >/= 4/5 to for functional endurance with walking/ standing and promote safety ( 02/05/2016)   Time 8   Period Weeks   Status On-going   PT LONG TERM GOAL #4   Title pt will be able to walk/ stand for >/= 45 minutes with LRAD with </= 1/10 pain to assist with functional endruance for ADLs and assist with pt's goal of returning to playing golf (02/05/2016)   Time 8   Period Weeks   Status On-going   PT LONG TERM GOAL #5   Title at discharge pt with improve his FOTO score to >/= 72 to demonstrate improvement in function (02/05/2016)   Time 8   Period Weeks   Status On-going   Additional Long Term Goals   Additional Long Term Goals Yes   PT LONG TERM GOAL #6   Title pt will improve his Berg balance score to >/= 45/56 to promote safety with walking/ standing and help with pt's goal with returning to playing golf (02/05/2016)   Baseline inital sore 30/56   Time 8   Period Weeks   Status New               Plan - 01/09/16 0934    Clinical Impression Statement Pt reports no back pain and no recent LOB. His hip strength has improved and he shows improved sit-stand eccentric control. He continues to have occassional LOB posterior upon standing. He also demonstrates unsafe attempts at standing from Nustep and was educated on proper sequencing to ensure safe rise from chair. No increased pain today. STG#1, #3 partially Met.    PT Next Visit Plan core strengthening, progress balance training PRN, LE strengthening, (HX OF CX and PACEMAKER : NO MHP OR E-STIM)      Patient will benefit from skilled therapeutic intervention in order to improve the following deficits and impairments:  Pain, Improper body mechanics, Postural dysfunction, Decreased balance, Decreased endurance, Decreased activity tolerance, Difficulty walking,  Decreased range of motion, Abnormal gait, Hypomobility, Increased fascial restricitons, Decreased strength, Decreased mobility  Visit Diagnosis: Bilateral low back pain, with sciatica presence unspecified  Muscle weakness (generalized)  Cramp and spasm  Other abnormalities of gait and mobility  Unsteadiness on feet     Problem List Patient Active Problem List   Diagnosis Date Noted  . S/P shoulder replacement 06/27/2015  . Residual cognitive deficit as late effect of stroke 04/15/2015  . Lung nodule   . Abnormal CXR 12/12/2014  . GI bleed 12/11/2014  . Weakness generalized 12/11/2014  . Leukocytosis 12/11/2014  . Restless leg syndrome 12/11/2014  . Anemia 12/11/2014  . Abnormal EKG 12/11/2014  . Gait disturbance, post-stroke 10/22/2014  . TIA (transient ischemic attack) 06/18/2014  . Partial seizures ([place]) 06/18/2014  . Seizure ([place]) 06/18/2014  . Vitamin B12 deficiency 06/18/2014  . OSA (obstructive sleep apnea) 02/26/2014  . Cognitive deficits as late effect of cerebrovascular disease 01/11/2014  . Fatigue 09/20/2013  . Flu-like symptoms 06/19/2013  . Intermittent lightheadedness 06/19/2013  . Low blood pressure, not hypotension 06/19/2013  . Influenza with respiratory manifestations 06/19/2013  . Embolic cerebral infarction ([place]) 04/09/2013  . CVA (cerebral infarction) 04/03/2013  .  Occlusion and stenosis of carotid artery without mention of cerebral infarction 11/21/2012  . OA (osteoarthritis) of knee 12/20/2011  . Methotrexate, long term, current use 09/10/2011  . Knee pain, left 07/27/2011  . Pacemaker-Medtronic 09/08/2010  . UNSPECIFIED VISUAL DISTURBANCE 07/20/2010  . SYNCOPE 06/01/2010  . IRON DEFIC ANEMIA [place] DIET IRON INTAKE 04/20/2010  . PSORIASIS 04/20/2010  . HEADACHE, PERSISTENT 01/13/2010  . HEMATURIA UNSPECIFIED 11/19/2009  . PHLEBITIS AND THROMBOPHLEBITIS OF OTHER SITE 07/02/2009  . CELLULITIS, LEG, LEFT 07/02/2009  . DISUSE OSTEOPOROSIS  05/12/2009  . HYPERGLYCEMIA, BORDERLINE 01/10/2009  . SPONDYLOSIS, LUMBAR, WITH RADICULOPATHY 10/15/2008  . OTHER SPECIFIED DERMATOMYCOSES 06/24/2008  . EXTRINSIC ASTHMA, WITH EXACERBATION 05/01/2008  . CALCU GALLBLADD W/OTH CHOLECYSTITIS&OBSTRUCTION 02/20/2008  . VARICOSE VEINS LOWER EXTREMITIES W/INFLAMMATION 12/25/2007  . EDEMA 12/25/2007  . INSOMNIA WITH SLEEP APNEA UNSPECIFIED 08/31/2007  . GERD 03/23/2007  . FIBRILLATION, ATRIAL 01/31/2007  . PSORIATIC ARTHRITIS 01/31/2007  . ABDOMINAL BLOATING 01/31/2007  . Hyperlipidemia 01/19/2007  . Essential hypertension 01/19/2007  . PROSTATE CANCER, HX OF 01/19/2007    Dorene Ar, PTA 01/09/2016, 9:54 AM  [facility] [address] [place], [place], [address] Phone: [phone]   Fax:  [phone]  Name: DRAYLEN LOBUE MRN: [id number] Date of Birth: 04-20-1934

## 2016-01-12 ENCOUNTER — Encounter: Payer: PPO | Admitting: Physical Therapy

## 2016-01-14 ENCOUNTER — Encounter: Payer: PPO | Admitting: Physical Therapy

## 2016-01-16 ENCOUNTER — Encounter: Payer: PPO | Admitting: Physical Therapy

## 2016-01-19 ENCOUNTER — Ambulatory Visit: Payer: PPO | Admitting: Physical Therapy

## 2016-01-20 ENCOUNTER — Telehealth: Payer: Self-pay | Admitting: Physical Therapy

## 2016-01-20 NOTE — Telephone Encounter (Signed)
LVM regarding pt missing his second scheduled visit on 01/19/2016 at 1:30 and that he has another scheduled visit on 01/21/2016 at 1:30. If he misses that visit it being his 3rd consecutive  no call/ no show that per our policy he will be discharged. In order to avoid that from occurring he could call and cancel if he can't make it or show up to his appointment.

## 2016-01-21 ENCOUNTER — Ambulatory Visit: Payer: PPO | Attending: Physical Medicine and Rehabilitation | Admitting: Physical Therapy

## 2016-01-21 DIAGNOSIS — M545 Low back pain: Secondary | ICD-10-CM | POA: Diagnosis not present

## 2016-01-21 DIAGNOSIS — R2681 Unsteadiness on feet: Secondary | ICD-10-CM

## 2016-01-21 DIAGNOSIS — R252 Cramp and spasm: Secondary | ICD-10-CM

## 2016-01-21 DIAGNOSIS — M6281 Muscle weakness (generalized): Secondary | ICD-10-CM | POA: Diagnosis not present

## 2016-01-21 DIAGNOSIS — R2689 Other abnormalities of gait and mobility: Secondary | ICD-10-CM

## 2016-01-21 NOTE — Therapy (Signed)
[place], [place], [address] Phone: [phone]   Fax:  [phone]  Physical Therapy Treatment  Patient Details  Name: Christopher Baker MRN: [id number] Date of Birth: 1933/09/28 Referring Provider: Suella Broad Md.  Encounter Date: 01/21/2016      PT End of Session - 01/21/16 1418    Visit Number 8   Number of Visits 17   Date for PT Re-Evaluation 02/05/16   Authorization Type [employer]: Kx mod by 15th visit; progress note/ G code by 10th visit   PT Start Time 1330   PT Stop Time 1411   PT Time Calculation (min) 41 min   Activity Tolerance Patient tolerated treatment well   Behavior During Therapy [facility] for tasks assessed/performed      Past Medical History:  Diagnosis Date  . Arthritis with psoriasis ([place])   . Borderline diabetes    DIET CONTROLLED - PT STATES Christopher Baker IS NOT DIABETIC  . Cancer [facility])    prostate  2008  . Carotid stenosis, bilateral PER DR EARLY / DUPLEX  09-09-10     40 - 59%   BILATERALLY--- ASYMPTOMATIC  . Cataract immature LEFT EYE  . Dysrhythmia    a-fib  . GERD (gastroesophageal reflux disease)    CONTROLLED W/ PROTONIX  . History of prostate cancer S/P RADIATION  TX  6 YRS AGO  . HTN (hypertension)   . Hyperlipidemia   . Iron deficiency   . Lumbar spondylosis W/ RADICULOPATHY  . PAF (paroxysmal atrial fibrillation) ([place])   . Restless leg syndrome   . Sleep apnea TESTED YRS AGO-- NO CPAP RX GIVEN   PT STATES Christopher Baker DOES NOT THINK Christopher Baker STILL HAS SLEEP APNEA--DOES NOT USE CPAP  . Status post placement of cardiac pacemaker DDD-  06-04-10-- LAST CHECK 09-08-10 IN EPIC   SECONDARY TO SYNCOPY AND BRADYCARDIA  . Stroke [facility]) Oct. 14, 2014   light left hand, balance issues, short term memory loss 2014  . SVT (supraventricular tachycardia) ([place])    S/P ABLATION   2008    Past Surgical History:  Procedure Laterality Date  . BACK SURGERY    . CARDIAC ELECTROPHYSIOLOGY STUDY  AND ABLATION  2008   FOR SVT  . CARDIAC PACEMAKER PLACEMENT  06-04-10   DDD/ AND REMOVAL LOOR RECORDER  . CHOLECYSTECTOMY  2009  . ESOPHAGOGASTRODUODENOSCOPY N/A 12/12/2014   Procedure: ESOPHAGOGASTRODUODENOSCOPY (EGD);  Surgeon: Wilford Corner, MD;  Location: [facility] ENDOSCOPY;  Service: Endoscopy;  Laterality: N/A;  . INGUINAL HERNIA REPAIR  2005   LEFT/   DONE WITH PENILE PROSTESIS SURG.  . JOINT REPLACEMENT    . KNEE ARTHROSCOPY  06/02/2011   Procedure: ARTHROSCOPY KNEE;  Surgeon: Gearlean Alf;  Location: [place];  Service: Orthopedics;  Laterality: Left;  LEFT KNEE ARTHROSCOPY WITH DEBRIDEMENT  . LOOP RECORDER PLACEMENT  01-30-10  . LUMBAR LAMINECTOMY/ DISKECTOMY/ FUSION  05-19-10   L4 - 5  . PENILE PROSTHESIS IMPLANT  1995  . PROSTATE BIOPSY  2007  . REMOVAL AND PLACEMENT PENILE PROSTHESIS IMPLANT   2005   AND LEFT CORPOROPLASTY /    DONE INGUINAL REPAIR  . REPLACEMENT TOTAL KNEE  1997   RIGHT  . SPINE SURGERY    . TOTAL KNEE ARTHROPLASTY  12/20/2011   Procedure: TOTAL KNEE ARTHROPLASTY;  Surgeon: Gearlean Alf, MD;  Location: [facility];  Service: Orthopedics;  Laterality: Left;  . TOTAL SHOULDER ARTHROPLASTY Right 06/27/2015   Procedure: REVERSE TOTAL  SHOULDER ARTHROPLASTY;  Surgeon: Netta Cedars, MD;  Location: [place];  Service: Orthopedics;  Laterality: Right;  . TRANSURETHRAL RESECTION OF PROSTATE  2006    There were no vitals filed for this visit.      Subjective Assessment - 01/21/16 1330    Subjective "I was on vacation that was I wasn't here for the last couple of visits, feel like my balance getting better gradually"    Currently in Pain? Yes   Pain Score 2    Pain Location Back   Pain Orientation Right;Lower   Pain Descriptors / Indicators Tightness;Sore   Pain Onset More than a month ago   Pain Frequency Intermittent   Aggravating Factors  twisting             [facility] PT Assessment - 01/21/16 0001      Observation/Other Assessments    Focus on Therapeutic Outcomes (FOTO)  39% limited      Strength   Right Hip Flexion 4-/5   Right Hip Extension 3+/5   Right Hip ABduction 3+/5   Right Hip ADduction 5/5   Left Hip Flexion 4/5   Left Hip Extension 3+/5   Left Hip ABduction 3+/5   Left Hip ADduction 5/5   Right Knee Flexion 4+/5   Right Knee Extension 4+/5   Left Knee Flexion 4+/5   Left Knee Extension 4+/5                     [facility] Adult PT Treatment/Exercise - 01/21/16 1351      Knee/Hip Exercises: Standing   Hip Abduction AROM;Stengthening;Both;2 sets;10 reps   Hip Extension AROM;Stengthening;Both;2 sets;10 reps   Other Standing Knee Exercises standing marching 2 x 10 with 1 HHA, 2 x 10 with CGA +1 with pt having no HHA     Knee/Hip Exercises: Sidelying   Hip ABduction AROM;Strengthening;Right;Left;2 sets;15 reps  verbal/ tactile cues for form    Hip ADduction AROM;Strengthening;Both;1 set;15 reps             Balance Exercises - 01/21/16 1406      Balance Exercises: Standing   Standing Eyes Opened Head turns;Solid surface;Other reps (comment);Narrow base of support (BOS)   Tandem Stance Eyes open;10 secs  with trunk turns to L/R   SLS Eyes open;Upper extremity support 1;1 rep;30 secs           PT Education - 01/21/16 1405    Education provided Yes   Education Details HEP review and updated HEP with proper form and treatment rationale   Person(s) Educated Patient   Methods Explanation;Verbal cues;Handout   Comprehension Verbalized understanding;Verbal cues required          PT Short Term Goals - 01/21/16 1420      PT SHORT TERM GOAL #1   Title pt will be I with inital HEP (01/10/2016)   Time 4   Period Weeks   Status Achieved     PT SHORT TERM GOAL #2   Title pt will be able to verbalize and demonstrate proper posture and lifting and carrying techniques to prevent and reduce low back pain (01/10/2016)   Time 4   Period Weeks   Status Partially Met     PT SHORT TERM  GOAL #3   Title pt will improve bil hip abductor/ extensor strength to >/= 4-/5 with >/= 2/10 pain in the low back to promote functional stability (01/10/2016)   Time 4   Period Weeks   Status Partially Met  PT SHORT TERM GOAL #4   Title berg balance testing will be performed and goals well be made   Time 2   Period Weeks   Status Achieved     PT SHORT TERM GOAL #5   Title pt will improve trunk mobility by >/= 5 degrees in all planes with </= 2/10 pain and to promote functional progression (01/10/2016)   Time 4   Period Weeks   Status On-going           PT Long Term Goals - 01/21/16 1420      PT LONG TERM GOAL #1   Title pt will be I with all HEP given as of last visit (02/05/2016)   Time 8   Period Weeks   Status On-going     PT LONG TERM GOAL #2   Title Christopher Baker will improve trunk flexion and side bending by >/= 10 degrees with </= 1/10 pain </= low guard with no report of feeling like Christopher Baker will fall to promote functional trunk mobility and safety during ADLS (2/95/2841)   Time 8   Period Weeks   Status On-going     PT LONG TERM GOAL #3   Title pt will improve bil hip strength to >/= 4/5 to for functional endurance with walking/ standing and promote safety ( 02/05/2016)   Time 8   Period Weeks   Status On-going     PT LONG TERM GOAL #4   Title pt will be able to walk/ stand for >/= 45 minutes with LRAD with </= 1/10 pain to assist with functional endruance for ADLs and assist with pt's goal of returning to playing golf (02/05/2016)   Time 8   Period Weeks   Status On-going     PT LONG TERM GOAL #5   Title at discharge pt with improve his FOTO score to >/= 72 to demonstrate improvement in function (02/05/2016)   Time 8   Period Weeks   Status On-going     PT LONG TERM GOAL #6   Title pt will improve his Berg balance score to >/= 45/56 to promote safety with walking/ standing and help with pt's goal with returning to playing golf (02/05/2016)   Time 8   Period Weeks    Status Unable to assess               Plan - 01/21/16 1418    Clinical Impression Statement Christopher Baker states Christopher Baker was unable to make last appointments due to being out of town. Christopher Baker continues ot demosntrate weakness of bil hip abductores but is demonstrating improvement with standing balance. Christopher Baker required multiple verbal/ tactile cues for form with hip strengthening in standing/ sidelying. pt reported some soreness post session but not worse than when Christopher Baker arrived.    PT Next Visit Plan core strengthening, progress balance training PRN, LE strengthening, (HX OF CX and PACEMAKER : NO MHP OR E-STIM)   Consulted and Agree with Plan of Care Patient      Patient will benefit from skilled therapeutic intervention in order to improve the following deficits and impairments:  Pain, Improper body mechanics, Postural dysfunction, Decreased balance, Decreased endurance, Decreased activity tolerance, Difficulty walking, Decreased range of motion, Abnormal gait, Hypomobility, Increased fascial restricitons, Decreased strength, Decreased mobility  Visit Diagnosis: Bilateral low back pain, with sciatica presence unspecified  Muscle weakness (generalized)  Cramp and spasm  Other abnormalities of gait and mobility  Unsteadiness on feet     Problem List Patient Active  Problem List   Diagnosis Date Noted  . S/P shoulder replacement 06/27/2015  . Residual cognitive deficit as late effect of stroke 04/15/2015  . Lung nodule   . Abnormal CXR 12/12/2014  . GI bleed 12/11/2014  . Weakness generalized 12/11/2014  . Leukocytosis 12/11/2014  . Restless leg syndrome 12/11/2014  . Anemia 12/11/2014  . Abnormal EKG 12/11/2014  . Gait disturbance, post-stroke 10/22/2014  . TIA (transient ischemic attack) 06/18/2014  . Partial seizures ([place]) 06/18/2014  . Seizure ([place]) 06/18/2014  . Vitamin B12 deficiency 06/18/2014  . OSA (obstructive sleep apnea) 02/26/2014  . Cognitive deficits as late effect of  cerebrovascular disease 01/11/2014  . Fatigue 09/20/2013  . Flu-like symptoms 06/19/2013  . Intermittent lightheadedness 06/19/2013  . Low blood pressure, not hypotension 06/19/2013  . Influenza with respiratory manifestations 06/19/2013  . Embolic cerebral infarction ([place]) 04/09/2013  . CVA (cerebral infarction) 04/03/2013  . Occlusion and stenosis of carotid artery without mention of cerebral infarction 11/21/2012  . OA (osteoarthritis) of knee 12/20/2011  . Methotrexate, long term, current use 09/10/2011  . Knee pain, left 07/27/2011  . Pacemaker-Medtronic 09/08/2010  . UNSPECIFIED VISUAL DISTURBANCE 07/20/2010  . SYNCOPE 06/01/2010  . IRON DEFIC ANEMIA [place] DIET IRON INTAKE 04/20/2010  . PSORIASIS 04/20/2010  . HEADACHE, PERSISTENT 01/13/2010  . HEMATURIA UNSPECIFIED 11/19/2009  . PHLEBITIS AND THROMBOPHLEBITIS OF OTHER SITE 07/02/2009  . CELLULITIS, LEG, LEFT 07/02/2009  . DISUSE OSTEOPOROSIS 05/12/2009  . HYPERGLYCEMIA, BORDERLINE 01/10/2009  . SPONDYLOSIS, LUMBAR, WITH RADICULOPATHY 10/15/2008  . OTHER SPECIFIED DERMATOMYCOSES 06/24/2008  . EXTRINSIC ASTHMA, WITH EXACERBATION 05/01/2008  . CALCU GALLBLADD W/OTH CHOLECYSTITIS&OBSTRUCTION 02/20/2008  . VARICOSE VEINS LOWER EXTREMITIES W/INFLAMMATION 12/25/2007  . EDEMA 12/25/2007  . INSOMNIA WITH SLEEP APNEA UNSPECIFIED 08/31/2007  . GERD 03/23/2007  . FIBRILLATION, ATRIAL 01/31/2007  . PSORIATIC ARTHRITIS 01/31/2007  . ABDOMINAL BLOATING 01/31/2007  . Hyperlipidemia 01/19/2007  . Essential hypertension 01/19/2007  . PROSTATE CANCER, HX OF 01/19/2007   Starr Lake PT, DPT, LAT, ATC  01/21/16  2:22 PM      [facility] [address] [place], [place], [address] Phone: [phone]   Fax:  [phone]  Name: Christopher Baker MRN: [id number] Date of Birth: August 30, 1933

## 2016-01-22 DIAGNOSIS — G4733 Obstructive sleep apnea (adult) (pediatric): Secondary | ICD-10-CM | POA: Diagnosis not present

## 2016-01-22 DIAGNOSIS — N529 Male erectile dysfunction, unspecified: Secondary | ICD-10-CM | POA: Diagnosis not present

## 2016-01-22 DIAGNOSIS — N486 Induration penis plastica: Secondary | ICD-10-CM | POA: Diagnosis not present

## 2016-01-22 DIAGNOSIS — Z8546 Personal history of malignant neoplasm of prostate: Secondary | ICD-10-CM | POA: Diagnosis not present

## 2016-01-22 DIAGNOSIS — N3941 Urge incontinence: Secondary | ICD-10-CM | POA: Diagnosis not present

## 2016-01-22 DIAGNOSIS — R319 Hematuria, unspecified: Secondary | ICD-10-CM | POA: Diagnosis not present

## 2016-01-23 ENCOUNTER — Ambulatory Visit: Payer: PPO | Admitting: Physical Therapy

## 2016-01-23 DIAGNOSIS — M545 Low back pain: Secondary | ICD-10-CM | POA: Diagnosis not present

## 2016-01-23 DIAGNOSIS — M6281 Muscle weakness (generalized): Secondary | ICD-10-CM

## 2016-01-23 DIAGNOSIS — R2681 Unsteadiness on feet: Secondary | ICD-10-CM

## 2016-01-23 DIAGNOSIS — R252 Cramp and spasm: Secondary | ICD-10-CM

## 2016-01-23 DIAGNOSIS — R2689 Other abnormalities of gait and mobility: Secondary | ICD-10-CM

## 2016-01-23 NOTE — Therapy (Signed)
[place], [place], [address] Phone: [phone]   Fax:  [phone]  Physical Therapy Treatment  Patient Details  Name: Christopher Baker MRN: [id number] Date of Birth: 05-05-1934 Referring Provider: Suella Broad Md.  Encounter Date: 01/23/2016      PT End of Session - 01/23/16 0859    Visit Number 9   Number of Visits 17   Date for PT Re-Evaluation 02/05/16   Authorization Type [employer]: Kx mod by 15th visit; progress note/ G code by 10th visit   PT Start Time 0849   PT Stop Time 0931   PT Time Calculation (min) 42 min      Past Medical History:  Diagnosis Date  . Arthritis with psoriasis ([place])   . Borderline diabetes    DIET CONTROLLED - PT STATES HE IS NOT DIABETIC  . Cancer [facility])    prostate  2008  . Carotid stenosis, bilateral PER DR EARLY / DUPLEX  09-09-10     40 - 59%   BILATERALLY--- ASYMPTOMATIC  . Cataract immature LEFT EYE  . Dysrhythmia    a-fib  . GERD (gastroesophageal reflux disease)    CONTROLLED W/ PROTONIX  . History of prostate cancer S/P RADIATION  TX  6 YRS AGO  . HTN (hypertension)   . Hyperlipidemia   . Iron deficiency   . Lumbar spondylosis W/ RADICULOPATHY  . PAF (paroxysmal atrial fibrillation) ([place])   . Restless leg syndrome   . Sleep apnea TESTED YRS AGO-- NO CPAP RX GIVEN   PT STATES HE DOES NOT THINK HE STILL HAS SLEEP APNEA--DOES NOT USE CPAP  . Status post placement of cardiac pacemaker DDD-  06-04-10-- LAST CHECK 09-08-10 IN EPIC   SECONDARY TO SYNCOPY AND BRADYCARDIA  . Stroke [facility]) Oct. 14, 2014   light left hand, balance issues, short term memory loss 2014  . SVT (supraventricular tachycardia) ([place])    S/P ABLATION   2008    Past Surgical History:  Procedure Laterality Date  . BACK SURGERY    . CARDIAC ELECTROPHYSIOLOGY STUDY AND ABLATION  2008   FOR SVT  . CARDIAC PACEMAKER PLACEMENT  06-04-10   DDD/ AND REMOVAL LOOR RECORDER  .  CHOLECYSTECTOMY  2009  . ESOPHAGOGASTRODUODENOSCOPY N/A 12/12/2014   Procedure: ESOPHAGOGASTRODUODENOSCOPY (EGD);  Surgeon: Wilford Corner, MD;  Location[facility] ENDOSCOPY;  Service: Endoscopy;  Laterality: N/A;  . INGUINAL HERNIA REPAIR  2005   LEFT/   DONE WITH PENILE PROSTESIS SURG.  . JOINT REPLACEMENT    . KNEE ARTHROSCOPY  06/02/2011   Procedure: ARTHROSCOPY KNEE;  Surgeon: Gearlean Alf;  Location: [place];  Service: Orthopedics;  Laterality: Left;  LEFT KNEE ARTHROSCOPY WITH DEBRIDEMENT  . LOOP RECORDER PLACEMENT  01-30-10  . LUMBAR LAMINECTOMY/ DISKECTOMY/ FUSION  05-19-10   L4 - 5  . PENILE PROSTHESIS IMPLANT  1995  . PROSTATE BIOPSY  2007  . REMOVAL AND PLACEMENT PENILE PROSTHESIS IMPLANT   2005   AND LEFT CORPOROPLASTY /    DONE INGUINAL REPAIR  . REPLACEMENT TOTAL KNEE  1997   RIGHT  . SPINE SURGERY    . TOTAL KNEE ARTHROPLASTY  12/20/2011   Procedure: TOTAL KNEE ARTHROPLASTY;  Surgeon: Gearlean Alf, MD;  Location[facility];  Service: Orthopedics;  Laterality: Left;  . TOTAL SHOULDER ARTHROPLASTY Right 06/27/2015   Procedure: REVERSE TOTAL SHOULDER ARTHROPLASTY;  Surgeon: Netta Cedars, MD;  Location: [place];  Service: Orthopedics;  Laterality: Right;  .  TRANSURETHRAL RESECTION OF PROSTATE  2006    There were no vitals filed for this visit.      Subjective Assessment - 01/23/16 0859    Subjective I have not fallen. I have never been able to lift up in my heels and toes.    Currently in Pain? Yes   Pain Score 1    Pain Location Back   Pain Orientation Mid;Lower;Right   Pain Descriptors / Indicators Sore   Pain Type Chronic pain   Aggravating Factors  when I use my legs   Pain Relieving Factors rest                         [facility] Adult PT Treatment/Exercise - 01/23/16 0001      High Level Balance   High Level Balance Comments alternating step taps to 8 inch step, also unilateral progressing from bil UE to 1 UE to No UE support,  repeated from airex pad      Lumbar Exercises: Standing   Heel Raises 2 seconds;15 reps   Heel Raises Limitations small ROM, also toe raises x 20 small ROM     Lumbar Exercises: Seated   Sit to Stand 10 reps  from standard chair   Sit to Stand Limitations CGA/min assist for occassional LOB posterior after standing, or unable to stand up completely   Other Seated Lumbar Exercises Seated heel raises with 5# on ankles x 20, Seated toe raises with 5# on feet x20     Knee/Hip Exercises: Standing   Hip Abduction AROM;Stengthening;Both;2 sets;10 reps   Abduction Limitations reduced to 1 UE    Hip Extension AROM;Stengthening;Both;2 sets;10 reps   Extension Limitations reduced to 1 UE                   PT Short Term Goals - 01/21/16 1420      PT SHORT TERM GOAL #1   Title pt will be I with inital HEP (01/10/2016)   Time 4   Period Weeks   Status Achieved     PT SHORT TERM GOAL #2   Title pt will be able to verbalize and demonstrate proper posture and lifting and carrying techniques to prevent and reduce low back pain (01/10/2016)   Time 4   Period Weeks   Status Partially Met     PT SHORT TERM GOAL #3   Title pt will improve bil hip abductor/ extensor strength to >/= 4-/5 with >/= 2/10 pain in the low back to promote functional stability (01/10/2016)   Time 4   Period Weeks   Status Partially Met     PT SHORT TERM GOAL #4   Title berg balance testing will be performed and goals well be made   Time 2   Period Weeks   Status Achieved     PT SHORT TERM GOAL #5   Title pt will improve trunk mobility by >/= 5 degrees in all planes with </= 2/10 pain and to promote functional progression (01/10/2016)   Time 4   Period Weeks   Status On-going           PT Long Term Goals - 01/21/16 1420      PT LONG TERM GOAL #1   Title pt will be I with all HEP given as of last visit (02/05/2016)   Time 8   Period Weeks   Status On-going     PT LONG TERM GOAL #2  Title He will  improve trunk flexion and side bending by >/= 10 degrees with </= 1/10 pain </= low guard with no report of feeling like he will fall to promote functional trunk mobility and safety during ADLS (4/53/6468)   Time 8   Period Weeks   Status On-going     PT LONG TERM GOAL #3   Title pt will improve bil hip strength to >/= 4/5 to for functional endurance with walking/ standing and promote safety ( 02/05/2016)   Time 8   Period Weeks   Status On-going     PT LONG TERM GOAL #4   Title pt will be able to walk/ stand for >/= 45 minutes with LRAD with </= 1/10 pain to assist with functional endruance for ADLs and assist with pt's goal of returning to playing golf (02/05/2016)   Time 8   Period Weeks   Status On-going     PT LONG TERM GOAL #5   Title at discharge pt with improve his FOTO score to >/= 72 to demonstrate improvement in function (02/05/2016)   Time 8   Period Weeks   Status On-going     PT LONG TERM GOAL #6   Title pt will improve his Berg balance score to >/= 45/56 to promote safety with walking/ standing and help with pt's goal with returning to playing golf (02/05/2016)   Time 8   Period Weeks   Status Unable to assess               Plan - 01/23/16 0927    Clinical Impression Statement Pt reports 1-2/10 pain initiailly with standing hip flexion exercises. After rest break he reported no pain throughout the rest of treatment. Continued standing balance and hip strengthening. Also PF and DF strength. Pt demonstates minimal ROM during standing heel and toe raises. Toes see to slap the ground as he walks maybe due to DF weakness.    PT Next Visit Plan core strengthening, progress balance training PRN, LE strengthening, DF/PF strength (HX OF CX and PACEMAKER : NO MHP OR E-STIM)      Patient will benefit from skilled therapeutic intervention in order to improve the following deficits and impairments:  Pain, Improper body mechanics, Postural dysfunction, Decreased balance,  Decreased endurance, Decreased activity tolerance, Difficulty walking, Decreased range of motion, Abnormal gait, Hypomobility, Increased fascial restricitons, Decreased strength, Decreased mobility  Visit Diagnosis: Bilateral low back pain, with sciatica presence unspecified  Muscle weakness (generalized)  Cramp and spasm  Other abnormalities of gait and mobility  Unsteadiness on feet     Problem List Patient Active Problem List   Diagnosis Date Noted  . S/P shoulder replacement 06/27/2015  . Residual cognitive deficit as late effect of stroke 04/15/2015  . Lung nodule   . Abnormal CXR 12/12/2014  . GI bleed 12/11/2014  . Weakness generalized 12/11/2014  . Leukocytosis 12/11/2014  . Restless leg syndrome 12/11/2014  . Anemia 12/11/2014  . Abnormal EKG 12/11/2014  . Gait disturbance, post-stroke 10/22/2014  . TIA (transient ischemic attack) 06/18/2014  . Partial seizures ([place]) 06/18/2014  . Seizure ([place]) 06/18/2014  . Vitamin B12 deficiency 06/18/2014  . OSA (obstructive sleep apnea) 02/26/2014  . Cognitive deficits as late effect of cerebrovascular disease 01/11/2014  . Fatigue 09/20/2013  . Flu-like symptoms 06/19/2013  . Intermittent lightheadedness 06/19/2013  . Low blood pressure, not hypotension 06/19/2013  . Influenza with respiratory manifestations 06/19/2013  . Embolic cerebral infarction ([place]) 04/09/2013  . CVA (cerebral infarction)  04/03/2013  . Occlusion and stenosis of carotid artery without mention of cerebral infarction 11/21/2012  . OA (osteoarthritis) of knee 12/20/2011  . Methotrexate, long term, current use 09/10/2011  . Knee pain, left 07/27/2011  . Pacemaker-Medtronic 09/08/2010  . UNSPECIFIED VISUAL DISTURBANCE 07/20/2010  . SYNCOPE 06/01/2010  . IRON DEFIC ANEMIA [place] DIET IRON INTAKE 04/20/2010  . PSORIASIS 04/20/2010  . HEADACHE, PERSISTENT 01/13/2010  . HEMATURIA UNSPECIFIED 11/19/2009  . PHLEBITIS AND THROMBOPHLEBITIS OF OTHER SITE  07/02/2009  . CELLULITIS, LEG, LEFT 07/02/2009  . DISUSE OSTEOPOROSIS 05/12/2009  . HYPERGLYCEMIA, BORDERLINE 01/10/2009  . SPONDYLOSIS, LUMBAR, WITH RADICULOPATHY 10/15/2008  . OTHER SPECIFIED DERMATOMYCOSES 06/24/2008  . EXTRINSIC ASTHMA, WITH EXACERBATION 05/01/2008  . CALCU GALLBLADD W/OTH CHOLECYSTITIS&OBSTRUCTION 02/20/2008  . VARICOSE VEINS LOWER EXTREMITIES W/INFLAMMATION 12/25/2007  . EDEMA 12/25/2007  . INSOMNIA WITH SLEEP APNEA UNSPECIFIED 08/31/2007  . GERD 03/23/2007  . FIBRILLATION, ATRIAL 01/31/2007  . PSORIATIC ARTHRITIS 01/31/2007  . ABDOMINAL BLOATING 01/31/2007  . Hyperlipidemia 01/19/2007  . Essential hypertension 01/19/2007  . PROSTATE CANCER, HX OF 01/19/2007    Dorene Ar, PTA 01/23/2016, 9:37 AM  [place], [place], [address] Phone: [phone]   Fax:  [phone]  Name: IGOR BISHOP MRN: [id number] Date of Birth: Jun 21, 1934

## 2016-01-26 ENCOUNTER — Ambulatory Visit: Payer: PPO | Admitting: Physical Therapy

## 2016-01-26 ENCOUNTER — Encounter (HOSPITAL_BASED_OUTPATIENT_CLINIC_OR_DEPARTMENT_OTHER): Payer: Self-pay | Admitting: Emergency Medicine

## 2016-01-26 DIAGNOSIS — S61412A Laceration without foreign body of left hand, initial encounter: Secondary | ICD-10-CM | POA: Diagnosis not present

## 2016-01-26 DIAGNOSIS — W228XXA Striking against or struck by other objects, initial encounter: Secondary | ICD-10-CM | POA: Diagnosis not present

## 2016-01-26 DIAGNOSIS — Z23 Encounter for immunization: Secondary | ICD-10-CM | POA: Insufficient documentation

## 2016-01-26 DIAGNOSIS — Z87891 Personal history of nicotine dependence: Secondary | ICD-10-CM | POA: Diagnosis not present

## 2016-01-26 DIAGNOSIS — Y999 Unspecified external cause status: Secondary | ICD-10-CM | POA: Diagnosis not present

## 2016-01-26 DIAGNOSIS — M545 Low back pain: Secondary | ICD-10-CM | POA: Diagnosis not present

## 2016-01-26 DIAGNOSIS — Y939 Activity, unspecified: Secondary | ICD-10-CM | POA: Diagnosis not present

## 2016-01-26 DIAGNOSIS — I10 Essential (primary) hypertension: Secondary | ICD-10-CM | POA: Insufficient documentation

## 2016-01-26 DIAGNOSIS — M6281 Muscle weakness (generalized): Secondary | ICD-10-CM

## 2016-01-26 DIAGNOSIS — Z8546 Personal history of malignant neoplasm of prostate: Secondary | ICD-10-CM | POA: Diagnosis not present

## 2016-01-26 DIAGNOSIS — S6992XA Unspecified injury of left wrist, hand and finger(s), initial encounter: Secondary | ICD-10-CM | POA: Diagnosis not present

## 2016-01-26 DIAGNOSIS — R252 Cramp and spasm: Secondary | ICD-10-CM

## 2016-01-26 DIAGNOSIS — R2689 Other abnormalities of gait and mobility: Secondary | ICD-10-CM

## 2016-01-26 DIAGNOSIS — Y92009 Unspecified place in unspecified non-institutional (private) residence as the place of occurrence of the external cause: Secondary | ICD-10-CM | POA: Insufficient documentation

## 2016-01-26 DIAGNOSIS — R2681 Unsteadiness on feet: Secondary | ICD-10-CM

## 2016-01-26 MED ORDER — TETANUS-DIPHTH-ACELL PERTUSSIS 5-2.5-18.5 LF-MCG/0.5 IM SUSP
0.5000 mL | Freq: Once | INTRAMUSCULAR | Status: AC
  Administered 2016-01-26: 0.5 mL via INTRAMUSCULAR

## 2016-01-26 MED ORDER — TETANUS-DIPHTH-ACELL PERTUSSIS 5-2.5-18.5 LF-MCG/0.5 IM SUSP
INTRAMUSCULAR | Status: AC
  Filled 2016-01-26: qty 0.5

## 2016-01-26 NOTE — ED Triage Notes (Signed)
Pt hit the back of his left hand on an iron railing tonight. Skin tear to back of left hand.

## 2016-01-26 NOTE — Therapy (Signed)
[place], [place], [address] Phone: [phone]   Fax:  [phone]  Physical Therapy Treatment  Patient Details  Name: Christopher Baker MRN: [id number] Date of Birth: 1934-05-06 Referring Provider: Suella Broad Md.  Encounter Date: 01/26/2016      PT End of Session - 01/26/16 1419    Visit Number 10   Number of Visits 17   Date for PT Re-Evaluation 02/05/16   Authorization Type [employer]: Kx mod by 15th visit; progress note/ G code by 10th visit   PT Start Time 1330   PT Stop Time 1410   PT Time Calculation (min) 40 min   Activity Tolerance Patient tolerated treatment well   Behavior During Therapy [facility] for tasks assessed/performed      Past Medical History:  Diagnosis Date  . Arthritis with psoriasis ([place])   . Borderline diabetes    DIET CONTROLLED - PT STATES HE IS NOT DIABETIC  . Cancer [facility])    prostate  2008  . Carotid stenosis, bilateral PER DR EARLY / DUPLEX  09-09-10     40 - 59%   BILATERALLY--- ASYMPTOMATIC  . Cataract immature LEFT EYE  . Dysrhythmia    a-fib  . GERD (gastroesophageal reflux disease)    CONTROLLED W/ PROTONIX  . History of prostate cancer S/P RADIATION  TX  6 YRS AGO  . HTN (hypertension)   . Hyperlipidemia   . Iron deficiency   . Lumbar spondylosis W/ RADICULOPATHY  . PAF (paroxysmal atrial fibrillation) ([place])   . Restless leg syndrome   . Sleep apnea TESTED YRS AGO-- NO CPAP RX GIVEN   PT STATES HE DOES NOT THINK HE STILL HAS SLEEP APNEA--DOES NOT USE CPAP  . Status post placement of cardiac pacemaker DDD-  06-04-10-- LAST CHECK 09-08-10 IN EPIC   SECONDARY TO SYNCOPY AND BRADYCARDIA  . Stroke [facility]) Oct. 14, 2014   light left hand, balance issues, short term memory loss 2014  . SVT (supraventricular tachycardia) ([place])    S/P ABLATION   2008    Past Surgical History:  Procedure Laterality Date  . BACK SURGERY    . CARDIAC ELECTROPHYSIOLOGY  STUDY AND ABLATION  2008   FOR SVT  . CARDIAC PACEMAKER PLACEMENT  06-04-10   DDD/ AND REMOVAL LOOR RECORDER  . CHOLECYSTECTOMY  2009  . ESOPHAGOGASTRODUODENOSCOPY N/A 12/12/2014   Procedure: ESOPHAGOGASTRODUODENOSCOPY (EGD);  Surgeon: Wilford Corner, MD;  Location: [facility] ENDOSCOPY;  Service: Endoscopy;  Laterality: N/A;  . INGUINAL HERNIA REPAIR  2005   LEFT/   DONE WITH PENILE PROSTESIS SURG.  . JOINT REPLACEMENT    . KNEE ARTHROSCOPY  06/02/2011   Procedure: ARTHROSCOPY KNEE;  Surgeon: Gearlean Alf;  Location: [place];  Service: Orthopedics;  Laterality: Left;  LEFT KNEE ARTHROSCOPY WITH DEBRIDEMENT  . LOOP RECORDER PLACEMENT  01-30-10  . LUMBAR LAMINECTOMY/ DISKECTOMY/ FUSION  05-19-10   L4 - 5  . PENILE PROSTHESIS IMPLANT  1995  . PROSTATE BIOPSY  2007  . REMOVAL AND PLACEMENT PENILE PROSTHESIS IMPLANT   2005   AND LEFT CORPOROPLASTY /    DONE INGUINAL REPAIR  . REPLACEMENT TOTAL KNEE  1997   RIGHT  . SPINE SURGERY    . TOTAL KNEE ARTHROPLASTY  12/20/2011   Procedure: TOTAL KNEE ARTHROPLASTY;  Surgeon: Gearlean Alf, MD;  Location: [facility];  Service: Orthopedics;  Laterality: Left;  . TOTAL SHOULDER ARTHROPLASTY Right 06/27/2015   Procedure: REVERSE TOTAL  SHOULDER ARTHROPLASTY;  Surgeon: Netta Cedars, MD;  Location: [place];  Service: Orthopedics;  Laterality: Right;  . TRANSURETHRAL RESECTION OF PROSTATE  2006    There were no vitals filed for this visit.      Subjective Assessment - 01/26/16 1333    Subjective "I am having some pain in the front of my R thigh/ groin"   Currently in Pain? Yes   Pain Score 2    Pain Location Back   Pain Descriptors / Indicators Aching   Pain Type Chronic pain   Pain Frequency Intermittent                         [facility] Adult PT Treatment/Exercise - 01/26/16 1339      Lumbar Exercises: Aerobic   Stationary Bike Nustep L5 x 8 minutes  LE only     Knee/Hip Exercises: Standing   Hip Abduction  AROM;Stengthening;Both;2 sets;10 reps   Hip Extension AROM;Stengthening;Both;2 sets;10 reps   Other Standing Knee Exercises standing marching in // 2 x 10 for form, 4 x 30 sec with alternating speed +1 supervision for safety keeping red line between feet to keep feet apart  tactile cueing to touch green theraband tied to bar             Balance Exercises - 01/26/16 1348      Balance Exercises: Standing   Standing Eyes Opened Solid surface;30 secs;Other (comment);5 reps;Wide (BOA);Narrow base of support (BOS)  rotation with feet gradually adductin between sets in //    Tandem Stance Eyes open;Eyes closed;3 reps  in //   SLS Eyes open;Upper extremity support 1;1 rep;30 secs  in //             PT Short Term Goals - 01/21/16 1420      PT SHORT TERM GOAL #1   Title pt will be I with inital HEP (01/10/2016)   Time 4   Period Weeks   Status Achieved     PT SHORT TERM GOAL #2   Title pt will be able to verbalize and demonstrate proper posture and lifting and carrying techniques to prevent and reduce low back pain (01/10/2016)   Time 4   Period Weeks   Status Partially Met     PT SHORT TERM GOAL #3   Title pt will improve bil hip abductor/ extensor strength to >/= 4-/5 with >/= 2/10 pain in the low back to promote functional stability (01/10/2016)   Time 4   Period Weeks   Status Partially Met     PT SHORT TERM GOAL #4   Title berg balance testing will be performed and goals well be made   Time 2   Period Weeks   Status Achieved     PT SHORT TERM GOAL #5   Title pt will improve trunk mobility by >/= 5 degrees in all planes with </= 2/10 pain and to promote functional progression (01/10/2016)   Time 4   Period Weeks   Status On-going           PT Long Term Goals - 01/21/16 1420      PT LONG TERM GOAL #1   Title pt will be I with all HEP given as of last visit (02/05/2016)   Time 8   Period Weeks   Status On-going     PT LONG TERM GOAL #2   Title He will  improve trunk flexion and side bending by >/= 10  degrees with </= 1/10 pain </= low guard with no report of feeling like he will fall to promote functional trunk mobility and safety during ADLS (02/17/5620)   Time 8   Period Weeks   Status On-going     PT LONG TERM GOAL #3   Title pt will improve bil hip strength to >/= 4/5 to for functional endurance with walking/ standing and promote safety ( 02/05/2016)   Time 8   Period Weeks   Status On-going     PT LONG TERM GOAL #4   Title pt will be able to walk/ stand for >/= 45 minutes with LRAD with </= 1/10 pain to assist with functional endruance for ADLs and assist with pt's goal of returning to playing golf (02/05/2016)   Time 8   Period Weeks   Status On-going     PT LONG TERM GOAL #5   Title at discharge pt with improve his FOTO score to >/= 72 to demonstrate improvement in function (02/05/2016)   Time 8   Period Weeks   Status On-going     PT LONG TERM GOAL #6   Title pt will improve his Berg balance score to >/= 45/56 to promote safety with walking/ standing and help with pt's goal with returning to playing golf (02/05/2016)   Time 8   Period Weeks   Status Unable to assess               Plan - 02/12/2016 1417    Clinical Impression Statement Mr. Vo reports he has been doing alot at the gym to get stronger in his legs and states he is very sore today. due to pt being more sore in the legs opted to focus on balance stand an dynamic in the //. pt performed well requiring minimal verbal/ tactile cues for form and safety.    PT Next Visit Plan core strengthening, progress balance training PRN, LE strengthening, DF/PF strength (HX OF CX and PACEMAKER : NO MHP OR E-STIM)   Consulted and Agree with Plan of Care Patient      Patient will benefit from skilled therapeutic intervention in order to improve the following deficits and impairments:  Pain, Improper body mechanics, Postural dysfunction, Decreased balance, Decreased  endurance, Decreased activity tolerance, Difficulty walking, Decreased range of motion, Abnormal gait, Hypomobility, Increased fascial restricitons, Decreased strength, Decreased mobility  Visit Diagnosis: Bilateral low back pain, with sciatica presence unspecified  Muscle weakness (generalized)  Cramp and spasm  Other abnormalities of gait and mobility  Unsteadiness on feet       G-Codes - 02/12/2016 1420    Functional Assessment Tool Used FOTO/ clinical judgement   Functional Limitation Mobility: Walking and moving around   Mobility: Walking and Moving Around Current Status [phone]) At least 20 percent but less than 40 percent impaired, limited or restricted   Mobility: Walking and Moving Around Goal Status [phone]) At least 1 percent but less than 20 percent impaired, limited or restricted      Problem List Patient Active Problem List   Diagnosis Date Noted  . S/P shoulder replacement 06/27/2015  . Residual cognitive deficit as late effect of stroke 04/15/2015  . Lung nodule   . Abnormal CXR 12/12/2014  . GI bleed 12/11/2014  . Weakness generalized 12/11/2014  . Leukocytosis 12/11/2014  . Restless leg syndrome 12/11/2014  . Anemia 12/11/2014  . Abnormal EKG 12/11/2014  . Gait disturbance, post-stroke 10/22/2014  . TIA (transient ischemic attack) 06/18/2014  . Partial seizures (  [place]) 06/18/2014  . Seizure ([place]) 06/18/2014  . Vitamin B12 deficiency 06/18/2014  . OSA (obstructive sleep apnea) 02/26/2014  . Cognitive deficits as late effect of cerebrovascular disease 01/11/2014  . Fatigue 09/20/2013  . Flu-like symptoms 06/19/2013  . Intermittent lightheadedness 06/19/2013  . Low blood pressure, not hypotension 06/19/2013  . Influenza with respiratory manifestations 06/19/2013  . Embolic cerebral infarction ([place]) 04/09/2013  . CVA (cerebral infarction) 04/03/2013  . Occlusion and stenosis of carotid artery without mention of cerebral infarction 11/21/2012  . OA  (osteoarthritis) of knee 12/20/2011  . Methotrexate, long term, current use 09/10/2011  . Knee pain, left 07/27/2011  . Pacemaker-Medtronic 09/08/2010  . UNSPECIFIED VISUAL DISTURBANCE 07/20/2010  . SYNCOPE 06/01/2010  . IRON DEFIC ANEMIA [place] DIET IRON INTAKE 04/20/2010  . PSORIASIS 04/20/2010  . HEADACHE, PERSISTENT 01/13/2010  . HEMATURIA UNSPECIFIED 11/19/2009  . PHLEBITIS AND THROMBOPHLEBITIS OF OTHER SITE 07/02/2009  . CELLULITIS, LEG, LEFT 07/02/2009  . DISUSE OSTEOPOROSIS 05/12/2009  . HYPERGLYCEMIA, BORDERLINE 01/10/2009  . SPONDYLOSIS, LUMBAR, WITH RADICULOPATHY 10/15/2008  . OTHER SPECIFIED DERMATOMYCOSES 06/24/2008  . EXTRINSIC ASTHMA, WITH EXACERBATION 05/01/2008  . CALCU GALLBLADD W/OTH CHOLECYSTITIS&OBSTRUCTION 02/20/2008  . VARICOSE VEINS LOWER EXTREMITIES W/INFLAMMATION 12/25/2007  . EDEMA 12/25/2007  . INSOMNIA WITH SLEEP APNEA UNSPECIFIED 08/31/2007  . GERD 03/23/2007  . FIBRILLATION, ATRIAL 01/31/2007  . PSORIATIC ARTHRITIS 01/31/2007  . ABDOMINAL BLOATING 01/31/2007  . Hyperlipidemia 01/19/2007  . Essential hypertension 01/19/2007  . PROSTATE CANCER, HX OF 01/19/2007   Starr Lake PT, DPT, LAT, ATC  01/26/16  2:20 PM      [facility] [address] [place], [place], [address] Phone: [phone]   Fax:  [phone]  Name: BARUCH LEWERS MRN: [id number] Date of Birth: 11-Aug-1933

## 2016-01-27 ENCOUNTER — Encounter (HOSPITAL_BASED_OUTPATIENT_CLINIC_OR_DEPARTMENT_OTHER): Payer: Self-pay | Admitting: Emergency Medicine

## 2016-01-27 ENCOUNTER — Emergency Department (HOSPITAL_BASED_OUTPATIENT_CLINIC_OR_DEPARTMENT_OTHER)
Admission: EM | Admit: 2016-01-27 | Discharge: 2016-01-27 | Disposition: A | Discharge status: Home / Self Care | Payer: PPO | Attending: Emergency Medicine | Admitting: Emergency Medicine

## 2016-01-27 DIAGNOSIS — S61412A Laceration without foreign body of left hand, initial encounter: Secondary | ICD-10-CM

## 2016-01-27 MED ORDER — BACITRACIN ZINC 500 UNIT/GM EX OINT
TOPICAL_OINTMENT | Freq: Two times a day (BID) | CUTANEOUS | Status: DC
  Administered 2016-01-27: 04:00:00 via TOPICAL

## 2016-01-27 NOTE — ED Provider Notes (Addendum)
[place] DEPT [facility] Provider Note   CSN: [id number] Arrival date & time: 01/26/16  2315  First Provider Contact:  First MD Initiated Contact with Patient 01/27/16 0236        History   Chief Complaint Chief Complaint  Patient presents with  . Hand Injury    HPI Christopher Baker is a 82 y.o. male.  The history is provided by the patient and the spouse.  Hand Injury   The incident occurred 3 to 5 hours ago. The incident occurred at home (banged dorsum of left hand on metal railing causing a skin tear). The injury mechanism was a direct blow. The pain is present in the left hand. The quality of the pain is described as aching. The pain is mild. The pain has been constant since the incident. Pertinent negatives include no malaise/fatigue. He reports no foreign bodies present. The symptoms are aggravated by palpation. He has tried nothing for the symptoms. The treatment provided no relief.    Past Medical History:  Diagnosis Date  . Arthritis with psoriasis ([place])   . Borderline diabetes    DIET CONTROLLED - PT STATES HE IS NOT DIABETIC  . Cancer [facility])    prostate  2008  . Carotid stenosis, bilateral PER DR EARLY / DUPLEX  09-09-10     40 - 59%   BILATERALLY--- ASYMPTOMATIC  . Cataract immature LEFT EYE  . Dysrhythmia    a-fib  . GERD (gastroesophageal reflux disease)    CONTROLLED W/ PROTONIX  . History of prostate cancer S/P RADIATION  TX  6 YRS AGO  . HTN (hypertension)   . Hyperlipidemia   . Iron deficiency   . Lumbar spondylosis W/ RADICULOPATHY  . PAF (paroxysmal atrial fibrillation) ([place])   . Restless leg syndrome   . Sleep apnea TESTED YRS AGO-- NO CPAP RX GIVEN   PT STATES HE DOES NOT THINK HE STILL HAS SLEEP APNEA--DOES NOT USE CPAP  . Status post placement of cardiac pacemaker DDD-  06-04-10-- LAST CHECK 09-08-10 IN EPIC   SECONDARY TO SYNCOPY AND BRADYCARDIA  . Stroke [facility]) Oct. 14, 2014   light left hand, balance issues, short term memory loss 2014  . SVT  (supraventricular tachycardia) ([place])    S/P ABLATION   2008    Patient Active Problem List   Diagnosis Date Noted  . S/P shoulder replacement 06/27/2015  . Residual cognitive deficit as late effect of stroke 04/15/2015  . Lung nodule   . Abnormal CXR 12/12/2014  . GI bleed 12/11/2014  . Weakness generalized 12/11/2014  . Leukocytosis 12/11/2014  . Restless leg syndrome 12/11/2014  . Anemia 12/11/2014  . Abnormal EKG 12/11/2014  . Gait disturbance, post-stroke 10/22/2014  . TIA (transient ischemic attack) 06/18/2014  . Partial seizures ([place]) 06/18/2014  . Seizure ([place]) 06/18/2014  . Vitamin B12 deficiency 06/18/2014  . OSA (obstructive sleep apnea) 02/26/2014  . Cognitive deficits as late effect of cerebrovascular disease 01/11/2014  . Fatigue 09/20/2013  . Flu-like symptoms 06/19/2013  . Intermittent lightheadedness 06/19/2013  . Low blood pressure, not hypotension 06/19/2013  . Influenza with respiratory manifestations 06/19/2013  . Embolic cerebral infarction ([place]) 04/09/2013  . CVA (cerebral infarction) 04/03/2013  . Occlusion and stenosis of carotid artery without mention of cerebral infarction 11/21/2012  . OA (osteoarthritis) of knee 12/20/2011  . Methotrexate, long term, current use 09/10/2011  . Knee pain, left 07/27/2011  . Pacemaker-Medtronic 09/08/2010  . UNSPECIFIED VISUAL DISTURBANCE 07/20/2010  . SYNCOPE 06/01/2010  .  IRON DEFIC ANEMIA [place] DIET IRON INTAKE 04/20/2010  . PSORIASIS 04/20/2010  . HEADACHE, PERSISTENT 01/13/2010  . HEMATURIA UNSPECIFIED 11/19/2009  . PHLEBITIS AND THROMBOPHLEBITIS OF OTHER SITE 07/02/2009  . CELLULITIS, LEG, LEFT 07/02/2009  . DISUSE OSTEOPOROSIS 05/12/2009  . HYPERGLYCEMIA, BORDERLINE 01/10/2009  . SPONDYLOSIS, LUMBAR, WITH RADICULOPATHY 10/15/2008  . OTHER SPECIFIED DERMATOMYCOSES 06/24/2008  . EXTRINSIC ASTHMA, WITH EXACERBATION 05/01/2008  . CALCU GALLBLADD W/OTH CHOLECYSTITIS&OBSTRUCTION 02/20/2008  . VARICOSE VEINS  LOWER EXTREMITIES W/INFLAMMATION 12/25/2007  . EDEMA 12/25/2007  . INSOMNIA WITH SLEEP APNEA UNSPECIFIED 08/31/2007  . GERD 03/23/2007  . FIBRILLATION, ATRIAL 01/31/2007  . PSORIATIC ARTHRITIS 01/31/2007  . ABDOMINAL BLOATING 01/31/2007  . Hyperlipidemia 01/19/2007  . Essential hypertension 01/19/2007  . PROSTATE CANCER, HX OF 01/19/2007    Past Surgical History:  Procedure Laterality Date  . BACK SURGERY    . CARDIAC ELECTROPHYSIOLOGY STUDY AND ABLATION  2008   FOR SVT  . CARDIAC PACEMAKER PLACEMENT  06-04-10   DDD/ AND REMOVAL LOOR RECORDER  . CHOLECYSTECTOMY  2009  . ESOPHAGOGASTRODUODENOSCOPY N/A 12/12/2014   Procedure: ESOPHAGOGASTRODUODENOSCOPY (EGD);  Surgeon: Wilford Corner, MD;  Location: [facility] ENDOSCOPY;  Service: Endoscopy;  Laterality: N/A;  . INGUINAL HERNIA REPAIR  2005   LEFT/   DONE WITH PENILE PROSTESIS SURG.  . JOINT REPLACEMENT    . KNEE ARTHROSCOPY  06/02/2011   Procedure: ARTHROSCOPY KNEE;  Surgeon: Gearlean Alf;  Location: [place];  Service: Orthopedics;  Laterality: Left;  LEFT KNEE ARTHROSCOPY WITH DEBRIDEMENT  . LOOP RECORDER PLACEMENT  01-30-10  . LUMBAR LAMINECTOMY/ DISKECTOMY/ FUSION  05-19-10   L4 - 5  . PENILE PROSTHESIS IMPLANT  1995  . PROSTATE BIOPSY  2007  . REMOVAL AND PLACEMENT PENILE PROSTHESIS IMPLANT   2005   AND LEFT CORPOROPLASTY /    DONE INGUINAL REPAIR  . REPLACEMENT TOTAL KNEE  1997   RIGHT  . SPINE SURGERY    . TOTAL KNEE ARTHROPLASTY  12/20/2011   Procedure: TOTAL KNEE ARTHROPLASTY;  Surgeon: Gearlean Alf, MD;  Location[facility];  Service: Orthopedics;  Laterality: Left;  . TOTAL SHOULDER ARTHROPLASTY Right 06/27/2015   Procedure: REVERSE TOTAL SHOULDER ARTHROPLASTY;  Surgeon: Netta Cedars, MD;  Location: [place];  Service: Orthopedics;  Laterality: Right;  . TRANSURETHRAL RESECTION OF PROSTATE  2006       Home Medications    Prior to Admission medications   Medication Sig Start Date End Date Taking?  Authorizing Provider  apixaban (ELIQUIS) 5 MG TABS tablet Take 1 tablet (5 mg total) by mouth 2 (two) times daily. 01/22/15   Evans Lance, MD  atorvastatin (LIPITOR) 20 MG tablet Take 20 mg by mouth daily.    Historical Provider, MD  citalopram (CELEXA) 20 MG tablet Take 20 mg by mouth daily. Take 2 tablets (40mg ) daily    Historical Provider, MD  clotrimazole-betamethasone (LOTRISONE) cream Apply 1 application topically 2 (two) times daily as needed (psoriasis).    Historical Provider, MD  docusate sodium (COLACE) 100 MG capsule Take 100 mg by mouth as needed. Reported on 12/11/2015    Historical Provider, MD  ezetimibe (ZETIA) 10 MG tablet Take 1 tablet (10 mg total) by mouth daily. 03/28/15   Kennyth Arnold, FNP  folic acid (FOLVITE) 1 MG tablet Take 1 mg by mouth daily.    Historical Provider, MD  furosemide (LASIX) 20 MG tablet TAKE 2 TABLETS (40 MG TOTAL) BY MOUTH DAILY. 10/28/15   Evans Lance, MD  lisinopril (PRINIVIL,ZESTRIL) 10 MG tablet Take 10 mg by mouth daily.    Historical Provider, MD  methotrexate (RHEUMATREX) 2.5 MG tablet Take 4 tablets (10 mg total) by mouth once a week. Caution:Chemotherapy. Protect from light. Every monday 07/05/14   Kennyth Arnold, FNP  mirabegron ER (MYRBETRIQ) 50 MG TB24 tablet Take 1 tablet (50 mg total) by mouth daily. 06/29/13   Ricard Dillon, MD  pantoprazole (PROTONIX) 40 MG tablet Take 1 tablet (40 mg total) by mouth daily. 10/22/15   Kennyth Arnold, FNP  predniSONE (DELTASONE) 5 MG tablet Take 1.5 tablets (7.5 mg total) by mouth daily. 05/02/15   Kennyth Arnold, FNP  triamcinolone cream (KENALOG) 0.1 % Apply to affected area as directed Patient taking differently: Apply 1 application topically daily as needed (rash). Apply to affected area as directed 04/03/14   Kennyth Arnold, FNP    Family History Family History  Problem Relation Age of Onset  . Stroke Mother   . Early death Father     Social History Social History  Substance Use Topics  .  Smoking status: Former Smoker    Packs/day: 0.10    Years: 55.00    Types: Cigarettes    Quit date: 12/07/1998  . Smokeless tobacco: Never Used  . Alcohol use 3.0 oz/week    5 Shots of liquor per week     Comment: gin and tonic     Allergies   Ciprofloxacin; Hydrocodone-acetaminophen; and Other   Review of Systems Review of Systems  Constitutional: Negative for malaise/fatigue.  Skin: Negative for wound.  All other systems reviewed and are negative.    Physical Exam Updated Vital Signs BP 148/74 (BP Location: Left Arm)   Pulse 68   Temp 97.5 F (36.4 C) (Oral)   Resp 18   Ht 6\' 3"  (1.905 m)   Wt 225 lb (102.1 kg)   SpO2 100%   BMI 28.12 kg/m   Physical Exam  Constitutional: He appears well-developed and well-nourished.  HENT:  Head: Normocephalic and atraumatic.  Mouth/Throat: No oropharyngeal exudate.  Eyes: Pupils are equal, round, and reactive to light.  Neck: Normal range of motion. Neck supple.  Cardiovascular: Normal rate, regular rhythm and intact distal pulses.   Pulmonary/Chest: Effort normal and breath sounds normal.  Abdominal: Soft. Bowel sounds are normal.  Musculoskeletal: Normal range of motion.       Arms: Neurological: He is alert. He has normal reflexes.  Skin: Skin is warm and dry. Capillary refill takes less than 2 seconds.     ED Treatments / Results  Labs (all labs ordered are listed, but only abnormal results are displayed) Labs Reviewed - No data to display  EKG  EKG Interpretation None       Radiology No results found.  Procedures Procedures (including critical care time)  Medications Ordered in ED Medications  bacitracin ointment ( Topical Given 01/27/16 0332)  Tdap (BOOSTRIX) injection 0.5 mL (0.5 mLs Intramuscular Given 01/26/16 2338)   Vitals:   01/26/16 2325 01/27/16 0400  BP: 153/81 148/74  Pulse: 63 68  Resp: 20 18  Temp: 97.8 F (36.6 C) 97.5 F (36.4 C)   Medications  Tdap (BOOSTRIX) injection 0.5 mL  (0.5 mLs Intramuscular Given 01/26/16 2338)       Initial Impression / Assessment and Plan / ED Course  I have reviewed the triage vital signs and the nursing notes.  Pertinent labs & imaging results that were available during my care of the  patient were reviewed by me and considered in my medical decision making (see chart for details).  Clinical Course   Wound care provided   Final Clinical Impressions(s) / ED Diagnoses   Final diagnoses:  Skin tear of hand without complication, left, initial encounter    New Prescriptions Discharge Medication List as of 01/27/2016  3:58 AM    All questions answered to patient's satisfaction. Based on history and exam patient has been appropriately medically screened and emergency conditions excluded. Patient is stable for discharge at this time. Follow up with your PMDfor recheck in 2 daysand strict return precautions given.   Veatrice Kells, MD 01/27/16 0730    Oswin Johal, MD 01/27/16 [phone]

## 2016-01-27 NOTE — ED Notes (Signed)
EMT at [facility]

## 2016-01-27 NOTE — ED Notes (Signed)
MD at bedside. 

## 2016-01-27 NOTE — ED Notes (Signed)
Pt alert, NAD, calm, interactive, resps e/u, reports hit hand, denies fall, skin tear noted to L hand, no active bleeding, (denies: h/o DM, nvd, dizziness). Pt updated, wife at [facility]. PCP is Dr. Osborne Casco. Meets with PT regularly. EDP into room, at [facility].

## 2016-01-28 ENCOUNTER — Ambulatory Visit: Payer: PPO | Admitting: Physical Therapy

## 2016-01-28 DIAGNOSIS — M545 Low back pain: Secondary | ICD-10-CM | POA: Diagnosis not present

## 2016-01-28 DIAGNOSIS — R2689 Other abnormalities of gait and mobility: Secondary | ICD-10-CM

## 2016-01-28 DIAGNOSIS — R252 Cramp and spasm: Secondary | ICD-10-CM

## 2016-01-28 DIAGNOSIS — R2681 Unsteadiness on feet: Secondary | ICD-10-CM

## 2016-01-28 DIAGNOSIS — M6281 Muscle weakness (generalized): Secondary | ICD-10-CM

## 2016-01-28 NOTE — Therapy (Signed)
[place], [place], [address] Phone: [phone]   Fax:  [phone]  Physical Therapy Treatment  Patient Details  Name: Christopher Baker MRN: [id number] Date of Birth: 1934/06/17 Referring Provider: Suella Broad Md.  Encounter Date: 01/28/2016      PT End of Session - 01/28/16 1456    Visit Number 11   Number of Visits 17   Date for PT Re-Evaluation 02/05/16   Authorization Type [employer]: Kx mod by 15th visit; progress note/ G code by 20th visit   PT Start Time 1336   PT Stop Time 1415   PT Time Calculation (min) 39 min   Equipment Utilized During Treatment Gait belt   Activity Tolerance Patient tolerated treatment well   Behavior During Therapy WFL for tasks assessed/performed      Past Medical History:  Diagnosis Date  . Arthritis with psoriasis ([place])   . Borderline diabetes    DIET CONTROLLED - PT STATES HE IS NOT DIABETIC  . Cancer [facility])    prostate  2008  . Carotid stenosis, bilateral PER DR EARLY / DUPLEX  09-09-10     40 - 59%   BILATERALLY--- ASYMPTOMATIC  . Cataract immature LEFT EYE  . Dysrhythmia    a-fib  . GERD (gastroesophageal reflux disease)    CONTROLLED W/ PROTONIX  . History of prostate cancer S/P RADIATION  TX  6 YRS AGO  . HTN (hypertension)   . Hyperlipidemia   . Iron deficiency   . Lumbar spondylosis W/ RADICULOPATHY  . PAF (paroxysmal atrial fibrillation) ([place])   . Restless leg syndrome   . Sleep apnea TESTED YRS AGO-- NO CPAP RX GIVEN   PT STATES HE DOES NOT THINK HE STILL HAS SLEEP APNEA--DOES NOT USE CPAP  . Status post placement of cardiac pacemaker DDD-  06-04-10-- LAST CHECK 09-08-10 IN EPIC   SECONDARY TO SYNCOPY AND BRADYCARDIA  . Stroke [facility]) Oct. 14, 2014   light left hand, balance issues, short term memory loss 2014  . SVT (supraventricular tachycardia) ([place])    S/P ABLATION   2008    Past Surgical History:  Procedure Laterality Date  .  BACK SURGERY    . CARDIAC ELECTROPHYSIOLOGY STUDY AND ABLATION  2008   FOR SVT  . CARDIAC PACEMAKER PLACEMENT  06-04-10   DDD/ AND REMOVAL LOOR RECORDER  . CHOLECYSTECTOMY  2009  . ESOPHAGOGASTRODUODENOSCOPY N/A 12/12/2014   Procedure: ESOPHAGOGASTRODUODENOSCOPY (EGD);  Surgeon: Wilford Corner, MD;  Location: [facility] ENDOSCOPY;  Service: Endoscopy;  Laterality: N/A;  . INGUINAL HERNIA REPAIR  2005   LEFT/   DONE WITH PENILE PROSTESIS SURG.  . JOINT REPLACEMENT    . KNEE ARTHROSCOPY  06/02/2011   Procedure: ARTHROSCOPY KNEE;  Surgeon: Gearlean Alf;  Location: [place];  Service: Orthopedics;  Laterality: Left;  LEFT KNEE ARTHROSCOPY WITH DEBRIDEMENT  . LOOP RECORDER PLACEMENT  01-30-10  . LUMBAR LAMINECTOMY/ DISKECTOMY/ FUSION  05-19-10   L4 - 5  . PENILE PROSTHESIS IMPLANT  1995  . PROSTATE BIOPSY  2007  . REMOVAL AND PLACEMENT PENILE PROSTHESIS IMPLANT   2005   AND LEFT CORPOROPLASTY /    DONE INGUINAL REPAIR  . REPLACEMENT TOTAL KNEE  1997   RIGHT  . SPINE SURGERY    . TOTAL KNEE ARTHROPLASTY  12/20/2011   Procedure: TOTAL KNEE ARTHROPLASTY;  Surgeon: Gearlean Alf, MD;  Location: [facility];  Service: Orthopedics;  Laterality: Left;  . TOTAL SHOULDER  ARTHROPLASTY Right 06/27/2015   Procedure: REVERSE TOTAL SHOULDER ARTHROPLASTY;  Surgeon: Netta Cedars, MD;  Location: [place];  Service: Orthopedics;  Laterality: Right;  . TRANSURETHRAL RESECTION OF PROSTATE  2006    There were no vitals filed for this visit.      Subjective Assessment - 01/28/16 1357    Subjective "Still alittle sore in the R groin, but getting better" pt reports slipping on his steps the other day at home and hit his L hand on a bolt, he went to the ER and got it treated.    Currently in Pain? Yes   Pain Score 2    Pain Location Back   Pain Orientation Right   Pain Descriptors / Indicators Sore   Pain Type Chronic pain   Pain Onset More than a month ago   Pain Frequency Intermittent                          [facility] Adult PT Treatment/Exercise - 01/28/16 1401      Lumbar Exercises: Aerobic   Stationary Bike Nustep L6 x 53mnutes, L3 x 4 min     Lumbar Exercises: Standing   Heel Raises 10 reps     Knee/Hip Exercises: Standing   Hip Abduction AROM;Stengthening;2 sets;10 reps;Knee straight  in //   Hip Extension AROM;Stengthening;Both;2 sets;10 reps  in //   Functional Squat 2 sets;10 reps in //  With bil HHA   Gait Training forward walking/ retrowalking in //   pt takes significantly smaller steps backward requiring cues   Other Standing Knee Exercises standing marching in // 2 x 10 for form, 4 x 30 sec with alternating speed +1 supervision for safety keeping red line between feet to keep feet apart             Balance Exercises - 01/28/16 1455      Balance Exercises: Standing   Standing Eyes Opened Solid surface;30 secs;Other (comment);5 reps;Wide (BOA);Narrow base of support (BOS)   Tandem Stance Eyes open;Eyes closed;3 reps   Gait with Head Turns Forward;4 reps  x 20 ft 4 x looking up/ down 4 x looking L/R             PT Short Term Goals - 01/21/16 1420      PT SHORT TERM GOAL #1   Title pt will be I with inital HEP (01/10/2016)   Time 4   Period Weeks   Status Achieved     PT SHORT TERM GOAL #2   Title pt will be able to verbalize and demonstrate proper posture and lifting and carrying techniques to prevent and reduce low back pain (01/10/2016)   Time 4   Period Weeks   Status Partially Met     PT SHORT TERM GOAL #3   Title pt will improve bil hip abductor/ extensor strength to >/= 4-/5 with >/= 2/10 pain in the low back to promote functional stability (01/10/2016)   Time 4   Period Weeks   Status Partially Met     PT SHORT TERM GOAL #4   Title berg balance testing will be performed and goals well be made   Time 2   Period Weeks   Status Achieved     PT SHORT TERM GOAL #5   Title pt will improve trunk mobility by >/=  5 degrees in all planes with </= 2/10 pain and to promote functional progression (01/10/2016)   Time 4  Period Weeks   Status On-going           PT Long Term Goals - 01/21/16 1420      PT LONG TERM GOAL #1   Title pt will be I with all HEP given as of last visit (02/05/2016)   Time 8   Period Weeks   Status On-going     PT LONG TERM GOAL #2   Title He will improve trunk flexion and side bending by >/= 10 degrees with </= 1/10 pain </= low guard with no report of feeling like he will fall to promote functional trunk mobility and safety during ADLS (2/72/5366)   Time 8   Period Weeks   Status On-going     PT LONG TERM GOAL #3   Title pt will improve bil hip strength to >/= 4/5 to for functional endurance with walking/ standing and promote safety ( 02/05/2016)   Time 8   Period Weeks   Status On-going     PT LONG TERM GOAL #4   Title pt will be able to walk/ stand for >/= 45 minutes with LRAD with </= 1/10 pain to assist with functional endruance for ADLs and assist with pt's goal of returning to playing golf (02/05/2016)   Time 8   Period Weeks   Status On-going     PT LONG TERM GOAL #5   Title at discharge pt with improve his FOTO score to >/= 72 to demonstrate improvement in function (02/05/2016)   Time 8   Period Weeks   Status On-going     PT LONG TERM GOAL #6   Title pt will improve his Berg balance score to >/= 45/56 to promote safety with walking/ standing and help with pt's goal with returning to playing golf (02/05/2016)   Time 8   Period Weeks   Status Unable to assess               Plan - 01/28/16 1457    Clinical Impression Statement Mr. Chalk reports slipping since last session while walking down the stairs, he didn't fall but hit his hand on a bolt causing a wound requiring a ED visit. Focused on standing hip strengthening, and gait training with head turns which he required +1 CGA for safety. continued balance training which pt is improving but  requires verbal cues for a wider BOS.    PT Next Visit Plan core strengthening, progress balance training PRN, LE strengthening, DF/PF strength, update HEP PRN (HX OF CX and PACEMAKER : NO MHP OR E-STIM)   Consulted and Agree with Plan of Care Patient      Patient will benefit from skilled therapeutic intervention in order to improve the following deficits and impairments:  Pain, Improper body mechanics, Postural dysfunction, Decreased balance, Decreased endurance, Decreased activity tolerance, Difficulty walking, Decreased range of motion, Abnormal gait, Hypomobility, Increased fascial restricitons, Decreased strength, Decreased mobility  Visit Diagnosis: Bilateral low back pain, with sciatica presence unspecified  Muscle weakness (generalized)  Cramp and spasm  Other abnormalities of gait and mobility  Unsteadiness on feet     Problem List Patient Active Problem List   Diagnosis Date Noted  . S/P shoulder replacement 06/27/2015  . Residual cognitive deficit as late effect of stroke 04/15/2015  . Lung nodule   . Abnormal CXR 12/12/2014  . GI bleed 12/11/2014  . Weakness generalized 12/11/2014  . Leukocytosis 12/11/2014  . Restless leg syndrome 12/11/2014  . Anemia 12/11/2014  . Abnormal EKG 12/11/2014  .  Gait disturbance, post-stroke 10/22/2014  . TIA (transient ischemic attack) 06/18/2014  . Partial seizures ([place]) 06/18/2014  . Seizure ([place]) 06/18/2014  . Vitamin B12 deficiency 06/18/2014  . OSA (obstructive sleep apnea) 02/26/2014  . Cognitive deficits as late effect of cerebrovascular disease 01/11/2014  . Fatigue 09/20/2013  . Flu-like symptoms 06/19/2013  . Intermittent lightheadedness 06/19/2013  . Low blood pressure, not hypotension 06/19/2013  . Influenza with respiratory manifestations 06/19/2013  . Embolic cerebral infarction ([place]) 04/09/2013  . CVA (cerebral infarction) 04/03/2013  . Occlusion and stenosis of carotid artery without mention of cerebral  infarction 11/21/2012  . OA (osteoarthritis) of knee 12/20/2011  . Methotrexate, long term, current use 09/10/2011  . Knee pain, left 07/27/2011  . Pacemaker-Medtronic 09/08/2010  . UNSPECIFIED VISUAL DISTURBANCE 07/20/2010  . SYNCOPE 06/01/2010  . IRON DEFIC ANEMIA [place] DIET IRON INTAKE 04/20/2010  . PSORIASIS 04/20/2010  . HEADACHE, PERSISTENT 01/13/2010  . HEMATURIA UNSPECIFIED 11/19/2009  . PHLEBITIS AND THROMBOPHLEBITIS OF OTHER SITE 07/02/2009  . CELLULITIS, LEG, LEFT 07/02/2009  . DISUSE OSTEOPOROSIS 05/12/2009  . HYPERGLYCEMIA, BORDERLINE 01/10/2009  . SPONDYLOSIS, LUMBAR, WITH RADICULOPATHY 10/15/2008  . OTHER SPECIFIED DERMATOMYCOSES 06/24/2008  . EXTRINSIC ASTHMA, WITH EXACERBATION 05/01/2008  . CALCU GALLBLADD W/OTH CHOLECYSTITIS&OBSTRUCTION 02/20/2008  . VARICOSE VEINS LOWER EXTREMITIES W/INFLAMMATION 12/25/2007  . EDEMA 12/25/2007  . INSOMNIA WITH SLEEP APNEA UNSPECIFIED 08/31/2007  . GERD 03/23/2007  . FIBRILLATION, ATRIAL 01/31/2007  . PSORIATIC ARTHRITIS 01/31/2007  . ABDOMINAL BLOATING 01/31/2007  . Hyperlipidemia 01/19/2007  . Essential hypertension 01/19/2007  . PROSTATE CANCER, HX OF 01/19/2007   Starr Lake PT, DPT, LAT, ATC  01/28/16  3:01 PM      [facility] [address] [place], [place], [address] Phone: [phone]   Fax:  [phone]  Name: Christopher Baker MRN: [id number] Date of Birth: 10/04/1933

## 2016-01-29 ENCOUNTER — Telehealth: Payer: Self-pay | Admitting: Pharmacist

## 2016-01-29 ENCOUNTER — Ambulatory Visit: Payer: PPO | Admitting: Neurology

## 2016-01-29 NOTE — Telephone Encounter (Signed)
Received fax from [facility].  Pt is scheduled for an injection on 02/06/16 and needs clearance to stop Eliquis x 3 days prior.  Reviewed chart.  Pt does have a CHADS score of 4 including CVA.  Dr. Lovena Le gave okay to hold x 2 1/2 days in March for same procedure. Will fax back clearance for pt to do the same this time.  He will take his last dose of Eliquis on morning of 8/15.

## 2016-01-30 ENCOUNTER — Ambulatory Visit: Payer: PPO | Admitting: Physical Therapy

## 2016-01-30 DIAGNOSIS — M6281 Muscle weakness (generalized): Secondary | ICD-10-CM

## 2016-01-30 DIAGNOSIS — M545 Low back pain: Secondary | ICD-10-CM | POA: Diagnosis not present

## 2016-01-30 DIAGNOSIS — R252 Cramp and spasm: Secondary | ICD-10-CM

## 2016-01-30 DIAGNOSIS — R2689 Other abnormalities of gait and mobility: Secondary | ICD-10-CM

## 2016-01-30 DIAGNOSIS — R2681 Unsteadiness on feet: Secondary | ICD-10-CM

## 2016-01-30 NOTE — Therapy (Signed)
[place], [place], [address] Phone: [phone]   Fax:  [phone]  Physical Therapy Treatment  Patient Details  Name: Christopher Baker MRN: [id number] Date of Birth: 12-09-1933 Referring Provider: Suella Broad Md.  Encounter Date: 01/30/2016      PT End of Session - 01/30/16 0908    Visit Number 12   Number of Visits 17   Date for PT Re-Evaluation 02/05/16   Authorization Type [employer]: Kx mod by 15th visit; progress note/ G code by 20th visit   PT Start Time 0901  pt arrived 16 min late today   PT Stop Time 0931   PT Time Calculation (min) 30 min   Activity Tolerance Patient tolerated treatment well   Behavior During Therapy [facility] for tasks assessed/performed      Past Medical History:  Diagnosis Date  . Arthritis with psoriasis ([place])   . Borderline diabetes    DIET CONTROLLED - PT STATES HE IS NOT DIABETIC  . Cancer [facility])    prostate  2008  . Carotid stenosis, bilateral PER DR EARLY / DUPLEX  09-09-10     40 - 59%   BILATERALLY--- ASYMPTOMATIC  . Cataract immature LEFT EYE  . Dysrhythmia    a-fib  . GERD (gastroesophageal reflux disease)    CONTROLLED W/ PROTONIX  . History of prostate cancer S/P RADIATION  TX  6 YRS AGO  . HTN (hypertension)   . Hyperlipidemia   . Iron deficiency   . Lumbar spondylosis W/ RADICULOPATHY  . PAF (paroxysmal atrial fibrillation) ([place])   . Restless leg syndrome   . Sleep apnea TESTED YRS AGO-- NO CPAP RX GIVEN   PT STATES HE DOES NOT THINK HE STILL HAS SLEEP APNEA--DOES NOT USE CPAP  . Status post placement of cardiac pacemaker DDD-  06-04-10-- LAST CHECK 09-08-10 IN EPIC   SECONDARY TO SYNCOPY AND BRADYCARDIA  . Stroke [facility]) Oct. 14, 2014   light left hand, balance issues, short term memory loss 2014  . SVT (supraventricular tachycardia) ([place])    S/P ABLATION   2008    Past Surgical History:  Procedure Laterality Date  . BACK SURGERY     . CARDIAC ELECTROPHYSIOLOGY STUDY AND ABLATION  2008   FOR SVT  . CARDIAC PACEMAKER PLACEMENT  06-04-10   DDD/ AND REMOVAL LOOR RECORDER  . CHOLECYSTECTOMY  2009  . ESOPHAGOGASTRODUODENOSCOPY N/A 12/12/2014   Procedure: ESOPHAGOGASTRODUODENOSCOPY (EGD);  Surgeon: Wilford Corner, MD;  Location: [facility] ENDOSCOPY;  Service: Endoscopy;  Laterality: N/A;  . INGUINAL HERNIA REPAIR  2005   LEFT/   DONE WITH PENILE PROSTESIS SURG.  . JOINT REPLACEMENT    . KNEE ARTHROSCOPY  06/02/2011   Procedure: ARTHROSCOPY KNEE;  Surgeon: Gearlean Alf;  Location: [place];  Service: Orthopedics;  Laterality: Left;  LEFT KNEE ARTHROSCOPY WITH DEBRIDEMENT  . LOOP RECORDER PLACEMENT  01-30-10  . LUMBAR LAMINECTOMY/ DISKECTOMY/ FUSION  05-19-10   L4 - 5  . PENILE PROSTHESIS IMPLANT  1995  . PROSTATE BIOPSY  2007  . REMOVAL AND PLACEMENT PENILE PROSTHESIS IMPLANT   2005   AND LEFT CORPOROPLASTY /    DONE INGUINAL REPAIR  . REPLACEMENT TOTAL KNEE  1997   RIGHT  . SPINE SURGERY    . TOTAL KNEE ARTHROPLASTY  12/20/2011   Procedure: TOTAL KNEE ARTHROPLASTY;  Surgeon: Gearlean Alf, MD;  [facility];  Service: Orthopedics;  Laterality: Left;  . TOTAL SHOULDER ARTHROPLASTY  Right 06/27/2015   Procedure: REVERSE TOTAL SHOULDER ARTHROPLASTY;  Surgeon: Netta Cedars, MD;  Location: [place];  Service: Orthopedics;  Laterality: Right;  . TRANSURETHRAL RESECTION OF PROSTATE  2006    There were no vitals filed for this visit.      Subjective Assessment - 01/30/16 0906    Subjective "I am feeling a little sore today in my back at about a 4/10" pt arrived to therapy without his cane stating he simply forgot it.    Currently in Pain? Yes   Pain Score 4    Pain Location Back   Pain Orientation Right   Pain Onset More than a month ago   Pain Frequency Intermittent   Aggravating Factors  N/A   Pain Relieving Factors resting            [facility] PT Assessment - 01/30/16 0915      Berg Balance Test    Sit to Stand Able to stand  independently using hands   Standing Unsupported Able to stand safely 2 minutes   Sitting with Back Unsupported but Feet Supported on Floor or Stool Able to sit safely and securely 2 minutes   Stand to Sit Controls descent by using hands   Transfers Able to transfer safely, definite need of hands   Standing Unsupported with Eyes Closed Able to stand 10 seconds with supervision   Standing Ubsupported with Feet Together Able to place feet together independently and stand for 1 minute with supervision   From Standing, Reach Forward with Outstretched Arm Can reach forward >12 cm safely (5")   From Standing Position, Pick up Object from Floor Able to pick up shoe, needs supervision   From Standing Position, Turn to Look Behind Over each Shoulder Looks behind one side only/other side shows less weight shift   Turn 360 Degrees Able to turn 360 degrees safely but slowly   Standing Unsupported, Alternately Place Feet on Step/Stool Able to complete 4 steps without aid or supervision   Standing Unsupported, One Foot in [employer] balance while stepping or standing  held 6 sec bil   Standing on One Leg Tries to lift leg/unable to hold 3 seconds but remains standing independently   Total Score 37                     [facility] Adult PT Treatment/Exercise - 01/30/16 0907      Lumbar Exercises: Aerobic   Stationary Bike Nustep L4 x 6 min     Knee/Hip Exercises: Aerobic   Nustep L4 x 8 min  LE only                PT Education - 01/30/16 0933    Education provided Yes   Education Details to talk to his personal trainer to tone down his exercise routine due to pt reporting he has no strength to walk out of the gym requireing multiple rest breaks to walk to the his car, or requires assistance from others to avoid from falling; he is doing too much.    Person(s) Educated Patient   Methods Explanation;Verbal cues   Comprehension Verbalized  understanding;Verbal cues required          PT Short Term Goals - 01/21/16 1420      PT SHORT TERM GOAL #1   Title pt will be I with inital HEP (01/10/2016)   Time 4   Period Weeks   Status Achieved     PT SHORT TERM  GOAL #2   Title pt will be able to verbalize and demonstrate proper posture and lifting and carrying techniques to prevent and reduce low back pain (01/10/2016)   Time 4   Period Weeks   Status Partially Met     PT SHORT TERM GOAL #3   Title pt will improve bil hip abductor/ extensor strength to >/= 4-/5 with >/= 2/10 pain in the low back to promote functional stability (01/10/2016)   Time 4   Period Weeks   Status Partially Met     PT SHORT TERM GOAL #4   Title berg balance testing will be performed and goals well be made   Time 2   Period Weeks   Status Achieved     PT SHORT TERM GOAL #5   Title pt will improve trunk mobility by >/= 5 degrees in all planes with </= 2/10 pain and to promote functional progression (01/10/2016)   Time 4   Period Weeks   Status On-going           PT Long Term Goals - 01/21/16 1420      PT LONG TERM GOAL #1   Title pt will be I with all HEP given as of last visit (02/05/2016)   Time 8   Period Weeks   Status On-going     PT LONG TERM GOAL #2   Title He will improve trunk flexion and side bending by >/= 10 degrees with </= 1/10 pain </= low guard with no report of feeling like he will fall to promote functional trunk mobility and safety during ADLS (3/00/9233)   Time 8   Period Weeks   Status On-going     PT LONG TERM GOAL #3   Title pt will improve bil hip strength to >/= 4/5 to for functional endurance with walking/ standing and promote safety ( 02/05/2016)   Time 8   Period Weeks   Status On-going     PT LONG TERM GOAL #4   Title pt will be able to walk/ stand for >/= 45 minutes with LRAD with </= 1/10 pain to assist with functional endruance for ADLs and assist with pt's goal of returning to playing golf  (02/05/2016)   Time 8   Period Weeks   Status On-going     PT LONG TERM GOAL #5   Title at discharge pt with improve his FOTO score to >/= 72 to demonstrate improvement in function (02/05/2016)   Time 8   Period Weeks   Status On-going     PT LONG TERM GOAL #6   Title pt will improve his Berg balance score to >/= 45/56 to promote safety with walking/ standing and help with pt's goal with returning to playing golf (02/05/2016)   Time 8   Period Weeks   Status Unable to assess               Plan - 01/30/16 0932    Clinical Impression Statement Limited session due to pt running 16 minutes late today. reassessed Merrilee Jansky which pt is demonstrating improvement from 30/56 to 37/56. Plan to continue with balance and strengthening.    PT Next Visit Plan core strengthening, progress balance training PRN, LE strengthening, DF/PF strength, update HEP PRN (HX OF CX and PACEMAKER : NO MHP OR E-STIM)   Consulted and Agree with Plan of Care Patient      Patient will benefit from skilled therapeutic intervention in order to improve the following deficits and impairments:  Pain, Improper body mechanics, Postural dysfunction, Decreased balance, Decreased endurance, Decreased activity tolerance, Difficulty walking, Decreased range of motion, Abnormal gait, Hypomobility, Increased fascial restricitons, Decreased strength, Decreased mobility  Visit Diagnosis: Bilateral low back pain, with sciatica presence unspecified  Muscle weakness (generalized)  Cramp and spasm  Other abnormalities of gait and mobility  Unsteadiness on feet     Problem List Patient Active Problem List   Diagnosis Date Noted  . S/P shoulder replacement 06/27/2015  . Residual cognitive deficit as late effect of stroke 04/15/2015  . Lung nodule   . Abnormal CXR 12/12/2014  . GI bleed 12/11/2014  . Weakness generalized 12/11/2014  . Leukocytosis 12/11/2014  . Restless leg syndrome 12/11/2014  . Anemia 12/11/2014  .  Abnormal EKG 12/11/2014  . Gait disturbance, post-stroke 10/22/2014  . TIA (transient ischemic attack) 06/18/2014  . Partial seizures ([place]) 06/18/2014  . Seizure ([place]) 06/18/2014  . Vitamin B12 deficiency 06/18/2014  . OSA (obstructive sleep apnea) 02/26/2014  . Cognitive deficits as late effect of cerebrovascular disease 01/11/2014  . Fatigue 09/20/2013  . Flu-like symptoms 06/19/2013  . Intermittent lightheadedness 06/19/2013  . Low blood pressure, not hypotension 06/19/2013  . Influenza with respiratory manifestations 06/19/2013  . Embolic cerebral infarction ([place]) 04/09/2013  . CVA (cerebral infarction) 04/03/2013  . Occlusion and stenosis of carotid artery without mention of cerebral infarction 11/21/2012  . OA (osteoarthritis) of knee 12/20/2011  . Methotrexate, long term, current use 09/10/2011  . Knee pain, left 07/27/2011  . Pacemaker-Medtronic 09/08/2010  . UNSPECIFIED VISUAL DISTURBANCE 07/20/2010  . SYNCOPE 06/01/2010  . IRON DEFIC ANEMIA [place] DIET IRON INTAKE 04/20/2010  . PSORIASIS 04/20/2010  . HEADACHE, PERSISTENT 01/13/2010  . HEMATURIA UNSPECIFIED 11/19/2009  . PHLEBITIS AND THROMBOPHLEBITIS OF OTHER SITE 07/02/2009  . CELLULITIS, LEG, LEFT 07/02/2009  . DISUSE OSTEOPOROSIS 05/12/2009  . HYPERGLYCEMIA, BORDERLINE 01/10/2009  . SPONDYLOSIS, LUMBAR, WITH RADICULOPATHY 10/15/2008  . OTHER SPECIFIED DERMATOMYCOSES 06/24/2008  . EXTRINSIC ASTHMA, WITH EXACERBATION 05/01/2008  . CALCU GALLBLADD W/OTH CHOLECYSTITIS&OBSTRUCTION 02/20/2008  . VARICOSE VEINS LOWER EXTREMITIES W/INFLAMMATION 12/25/2007  . EDEMA 12/25/2007  . INSOMNIA WITH SLEEP APNEA UNSPECIFIED 08/31/2007  . GERD 03/23/2007  . FIBRILLATION, ATRIAL 01/31/2007  . PSORIATIC ARTHRITIS 01/31/2007  . ABDOMINAL BLOATING 01/31/2007  . Hyperlipidemia 01/19/2007  . Essential hypertension 01/19/2007  . PROSTATE CANCER, HX OF 01/19/2007   Starr Lake PT, DPT, LAT, ATC  01/30/16  9:36 AM      [facility] [address] [place], [place], [address] Phone: [phone]   Fax:  [phone]  Name: ORVILLE MENA MRN: [id number] Date of Birth: January 20, 1934

## 2016-02-02 ENCOUNTER — Ambulatory Visit: Payer: PPO | Admitting: Physical Therapy

## 2016-02-02 ENCOUNTER — Telehealth: Payer: Self-pay | Admitting: Pulmonary Disease

## 2016-02-02 ENCOUNTER — Ambulatory Visit: Payer: PPO | Admitting: Neurology

## 2016-02-02 ENCOUNTER — Telehealth: Payer: Self-pay | Admitting: *Deleted

## 2016-02-02 DIAGNOSIS — M545 Low back pain: Secondary | ICD-10-CM | POA: Diagnosis not present

## 2016-02-02 DIAGNOSIS — R2681 Unsteadiness on feet: Secondary | ICD-10-CM

## 2016-02-02 DIAGNOSIS — M6281 Muscle weakness (generalized): Secondary | ICD-10-CM

## 2016-02-02 DIAGNOSIS — R252 Cramp and spasm: Secondary | ICD-10-CM

## 2016-02-02 DIAGNOSIS — R2689 Other abnormalities of gait and mobility: Secondary | ICD-10-CM

## 2016-02-02 NOTE — Telephone Encounter (Signed)
[facility] Reports   Date/Time Report Action User  12/15/2015 2:50 PM After Visit Summary Printed Richmond Campbell, LPN  Patient Instructions   Medication Instructions:  Your physician has recommended you make the following change in your medication:  DECREASE  ZESTRIL TO 1/2 TAB DAILY IF  NO  IMPROVEMENT  WITH SYMPTOMS RETURN TO ORIGINAL   DOSE    PT CALLED AND ASK IF THE LISINOPRIL COULD BE WRITTEN FOR 5 MG INSTEAD OF 10 MG AND HAVING TO CUT IT, SHE SAID THAT ITS HARD TO CUT IN HALF. PLEASE ADVISE, PT IS LEAVING OUT OF TOWN Friday. THANKS

## 2016-02-02 NOTE — Therapy (Signed)
[place], [place], [address] Phone: [phone]   Fax:  [phone]  Physical Therapy Treatment  Patient Details  Name: Christopher Baker MRN: [id number] Date of Birth: August 16, 1933 Referring Provider: Suella Broad Md.  Encounter Date: 02/02/2016      PT End of Session - 02/02/16 0931    Visit Number 13   Number of Visits 17   Date for PT Re-Evaluation 02/05/16   PT Start Time 0900   PT Stop Time 0925   PT Time Calculation (min) 25 min   Activity Tolerance Patient tolerated treatment well   Behavior During Therapy [facility] for tasks assessed/performed      Past Medical History:  Diagnosis Date  . Arthritis with psoriasis ([place])   . Borderline diabetes    DIET CONTROLLED - PT STATES HE IS NOT DIABETIC  . Cancer [facility])    prostate  2008  . Carotid stenosis, bilateral PER DR EARLY / DUPLEX  09-09-10     40 - 59%   BILATERALLY--- ASYMPTOMATIC  . Cataract immature LEFT EYE  . Dysrhythmia    a-fib  . GERD (gastroesophageal reflux disease)    CONTROLLED W/ PROTONIX  . History of prostate cancer S/P RADIATION  TX  6 YRS AGO  . HTN (hypertension)   . Hyperlipidemia   . Iron deficiency   . Lumbar spondylosis W/ RADICULOPATHY  . PAF (paroxysmal atrial fibrillation) ([place])   . Restless leg syndrome   . Sleep apnea TESTED YRS AGO-- NO CPAP RX GIVEN   PT STATES HE DOES NOT THINK HE STILL HAS SLEEP APNEA--DOES NOT USE CPAP  . Status post placement of cardiac pacemaker DDD-  06-04-10-- LAST CHECK 09-08-10 IN EPIC   SECONDARY TO SYNCOPY AND BRADYCARDIA  . Stroke [facility]) Oct. 14, 2014   light left hand, balance issues, short term memory loss 2014  . SVT (supraventricular tachycardia) ([place])    S/P ABLATION   2008    Past Surgical History:  Procedure Laterality Date  . BACK SURGERY    . CARDIAC ELECTROPHYSIOLOGY STUDY AND ABLATION  2008   FOR SVT  . CARDIAC PACEMAKER PLACEMENT  06-04-10   DDD/ AND REMOVAL LOOR  RECORDER  . CHOLECYSTECTOMY  2009  . ESOPHAGOGASTRODUODENOSCOPY N/A 12/12/2014   Procedure: ESOPHAGOGASTRODUODENOSCOPY (EGD);  Surgeon: Wilford Corner, MD;  Location: [facility] ENDOSCOPY;  Service: Endoscopy;  Laterality: N/A;  . INGUINAL HERNIA REPAIR  2005   LEFT/   DONE WITH PENILE PROSTESIS SURG.  . JOINT REPLACEMENT    . KNEE ARTHROSCOPY  06/02/2011   Procedure: ARTHROSCOPY KNEE;  Surgeon: Gearlean Alf;  Location: [place];  Service: Orthopedics;  Laterality: Left;  LEFT KNEE ARTHROSCOPY WITH DEBRIDEMENT  . LOOP RECORDER PLACEMENT  01-30-10  . LUMBAR LAMINECTOMY/ DISKECTOMY/ FUSION  05-19-10   L4 - 5  . PENILE PROSTHESIS IMPLANT  1995  . PROSTATE BIOPSY  2007  . REMOVAL AND PLACEMENT PENILE PROSTHESIS IMPLANT   2005   AND LEFT CORPOROPLASTY /    DONE INGUINAL REPAIR  . REPLACEMENT TOTAL KNEE  1997   RIGHT  . SPINE SURGERY    . TOTAL KNEE ARTHROPLASTY  12/20/2011   Procedure: TOTAL KNEE ARTHROPLASTY;  Surgeon: Gearlean Alf, MD;  Location: [facility];  Service: Orthopedics;  Laterality: Left;  . TOTAL SHOULDER ARTHROPLASTY Right 06/27/2015   Procedure: REVERSE TOTAL SHOULDER ARTHROPLASTY;  Surgeon: Netta Cedars, MD;  Location: [place];  Service: Orthopedics;  Laterality: Right;  .  TRANSURETHRAL RESECTION OF PROSTATE  2006    There were no vitals filed for this visit.      Subjective Assessment - 02/02/16 0903    Subjective Pain about the same.  I think I am doing a little better with balance.     Currently in Pain? Yes   Pain Score 4    Pain Location Back   Pain Orientation Right   Pain Descriptors / Indicators Sore   Pain Onset More than a month ago   Pain Frequency Intermittent   Pain Relieving Factors resting                         [facility] Adult PT Treatment/Exercise - 02/02/16 0001      Ambulation/Gait   Pre-Gait Activities in parallel bars,  cues to keep patient focused on task   Gait Comments intermittant CGA,  cues for technique  brief  rests     Lumbar Exercises: Aerobic   Stationary Bike Nustep L 6 X 5 minutes  fatigued                  PT Short Term Goals - 02/02/16 0932      PT SHORT TERM GOAL #1   Title pt will be I with inital HEP (01/10/2016)   Status Achieved     PT SHORT TERM GOAL #2   Title pt will be able to verbalize and demonstrate proper posture and lifting and carrying techniques to prevent and reduce low back pain (01/10/2016)   Time 4   Period Weeks   Status Unable to assess     PT SHORT TERM GOAL #3   Title pt will improve bil hip abductor/ extensor strength to >/= 4-/5 with >/= 2/10 pain in the low back to promote functional stability (01/10/2016)   Time 4   Period Weeks   Status Unable to assess     PT SHORT TERM GOAL #4   Title berg balance testing will be performed and goals well be made   Time 2   Period Weeks   Status Achieved     PT SHORT TERM GOAL #5   Title pt will improve trunk mobility by >/= 5 degrees in all planes with </= 2/10 pain and to promote functional progression (01/10/2016)   Time 4   Period Weeks   Status Unable to assess           PT Long Term Goals - 01/21/16 1420      PT LONG TERM GOAL #1   Title pt will be I with all HEP given as of last visit (02/05/2016)   Time 8   Period Weeks   Status On-going     PT LONG TERM GOAL #2   Title He will improve trunk flexion and side bending by >/= 10 degrees with </= 1/10 pain </= low guard with no report of feeling like he will fall to promote functional trunk mobility and safety during ADLS ([id number])   Time 8   Period Weeks   Status On-going     PT LONG TERM GOAL #3   Title pt will improve bil hip strength to >/= 4/5 to for functional endurance with walking/ standing and promote safety ( 02/05/2016)   Time 8   Period Weeks   Status On-going     PT LONG TERM GOAL #4   Title pt will be able to walk/ stand for >/= 45 minutes with LRAD  with </= 1/10 pain to assist with functional endruance for ADLs and  assist with pt's goal of returning to playing golf (02/05/2016)   Time 8   Period Weeks   Status On-going     PT LONG TERM GOAL #5   Title at discharge pt with improve his FOTO score to >/= 72 to demonstrate improvement in function (02/05/2016)   Time 8   Period Weeks   Status On-going     PT LONG TERM GOAL #6   Title pt will improve his Berg balance score to >/= 45/56 to promote safety with walking/ standing and help with pt's goal with returning to playing golf (02/05/2016)   Time 8   Period Weeks   Status Unable to assess             Patient will benefit from skilled therapeutic intervention in order to improve the following deficits and impairments:     Visit Diagnosis: Bilateral low back pain, with sciatica presence unspecified  Muscle weakness (generalized)  Cramp and spasm  Other abnormalities of gait and mobility  Unsteadiness on feet     Problem List Patient Active Problem List   Diagnosis Date Noted  . S/P shoulder replacement 06/27/2015  . Residual cognitive deficit as late effect of stroke 04/15/2015  . Lung nodule   . Abnormal CXR 12/12/2014  . GI bleed 12/11/2014  . Weakness generalized 12/11/2014  . Leukocytosis 12/11/2014  . Restless leg syndrome 12/11/2014  . Anemia 12/11/2014  . Abnormal EKG 12/11/2014  . Gait disturbance, post-stroke 10/22/2014  . TIA (transient ischemic attack) 06/18/2014  . Partial seizures ([place]) 06/18/2014  . Seizure ([place]) 06/18/2014  . Vitamin B12 deficiency 06/18/2014  . OSA (obstructive sleep apnea) 02/26/2014  . Cognitive deficits as late effect of cerebrovascular disease 01/11/2014  . Fatigue 09/20/2013  . Flu-like symptoms 06/19/2013  . Intermittent lightheadedness 06/19/2013  . Low blood pressure, not hypotension 06/19/2013  . Influenza with respiratory manifestations 06/19/2013  . Embolic cerebral infarction ([place]) 04/09/2013  . CVA (cerebral infarction) 04/03/2013  . Occlusion and stenosis of carotid  artery without mention of cerebral infarction 11/21/2012  . OA (osteoarthritis) of knee 12/20/2011  . Methotrexate, long term, current use 09/10/2011  . Knee pain, left 07/27/2011  . Pacemaker-Medtronic 09/08/2010  . UNSPECIFIED VISUAL DISTURBANCE 07/20/2010  . SYNCOPE 06/01/2010  . IRON DEFIC ANEMIA [place] DIET IRON INTAKE 04/20/2010  . PSORIASIS 04/20/2010  . HEADACHE, PERSISTENT 01/13/2010  . HEMATURIA UNSPECIFIED 11/19/2009  . PHLEBITIS AND THROMBOPHLEBITIS OF OTHER SITE 07/02/2009  . CELLULITIS, LEG, LEFT 07/02/2009  . DISUSE OSTEOPOROSIS 05/12/2009  . HYPERGLYCEMIA, BORDERLINE 01/10/2009  . SPONDYLOSIS, LUMBAR, WITH RADICULOPATHY 10/15/2008  . OTHER SPECIFIED DERMATOMYCOSES 06/24/2008  . EXTRINSIC ASTHMA, WITH EXACERBATION 05/01/2008  . CALCU GALLBLADD W/OTH CHOLECYSTITIS&OBSTRUCTION 02/20/2008  . VARICOSE VEINS LOWER EXTREMITIES W/INFLAMMATION 12/25/2007  . EDEMA 12/25/2007  . INSOMNIA WITH SLEEP APNEA UNSPECIFIED 08/31/2007  . GERD 03/23/2007  . FIBRILLATION, ATRIAL 01/31/2007  . PSORIATIC ARTHRITIS 01/31/2007  . ABDOMINAL BLOATING 01/31/2007  . Hyperlipidemia 01/19/2007  . Essential hypertension 01/19/2007  . PROSTATE CANCER, HX OF 01/19/2007    [facility] 02/02/2016, 9:34 AM  [facility] [address] [place], [place], [address] Phone: [phone]   Fax:  [phone]  Name: AIME BABBS MRN: [id number] Date of Birth: Sep 26, 1933   Melvenia Needles, PTA 02/02/16 9:34 AM Phone: [phone] Fax: [phone]

## 2016-02-02 NOTE — Telephone Encounter (Signed)
Called and spoke with pts wife and she stated that the pt uses [facility] now for his CPAP supplies.  She stated that she spoke with Maudie Mercury from [place] and kim is sending over the medicare form that needs to be signed by [facility] so the pt is able to get his brown climate air tube for his CPAP.  Will forward to Ashtyn to follow up on and to see if this form has been received.  ashtyn please advise. thanks

## 2016-02-03 MED ORDER — LISINOPRIL 5 MG PO TABS: 5.0000 mg | ORAL_TABLET | Freq: Every day | ORAL | 2 refills | Status: DC

## 2016-02-03 NOTE — Telephone Encounter (Signed)
Ok to fill as he is requesting

## 2016-02-03 NOTE — Telephone Encounter (Signed)
February 03, 2016  Dionicio Stall, RN      8:10 AM  Note    Ok to fill as he is requesting     Will fill & let pt know   Pt aware

## 2016-02-04 NOTE — Telephone Encounter (Signed)
Form was received but was sent with Dr Loren Racer name. Spoke with Jeani Hawking and she is faxing over new form.

## 2016-02-05 ENCOUNTER — Ambulatory Visit: Payer: PPO | Admitting: Physical Therapy

## 2016-02-05 ENCOUNTER — Encounter: Payer: Self-pay | Admitting: Physical Therapy

## 2016-02-05 DIAGNOSIS — R2689 Other abnormalities of gait and mobility: Secondary | ICD-10-CM

## 2016-02-05 DIAGNOSIS — M545 Low back pain: Secondary | ICD-10-CM | POA: Diagnosis not present

## 2016-02-05 DIAGNOSIS — R2681 Unsteadiness on feet: Secondary | ICD-10-CM

## 2016-02-05 DIAGNOSIS — M6281 Muscle weakness (generalized): Secondary | ICD-10-CM

## 2016-02-05 DIAGNOSIS — R252 Cramp and spasm: Secondary | ICD-10-CM

## 2016-02-05 NOTE — Therapy (Addendum)
[place], [place], [address] Phone: [phone]   Fax:  [phone]  Physical Therapy Treatment  Patient Details  Name: Christopher Baker MRN: [id number] Date of Birth: 10/18/1933 Referring Provider: Suella Broad Md.  Encounter Date: 02/05/2016      PT End of Session - 02/05/16 1545    Visit Number 14   Number of Visits 27   Date for PT Re-Evaluation 03/29/2016   Authorization Type [employer]: Kx mod by 15th visit; progress note/ G code by 20th visit   PT Start Time 1500   PT Stop Time 1345   PT Time Calculation (min) 1365 min   Equipment Utilized During Treatment Gait belt   Activity Tolerance Patient tolerated treatment well   Behavior During Therapy WFL for tasks assessed/performed      Past Medical History:  Diagnosis Date  . Arthritis with psoriasis ([place])   . Borderline diabetes    DIET CONTROLLED - PT STATES HE IS NOT DIABETIC  . Cancer [facility])    prostate  2008  . Carotid stenosis, bilateral PER DR EARLY / DUPLEX  09-09-10     40 - 59%   BILATERALLY--- ASYMPTOMATIC  . Cataract immature LEFT EYE  . Dysrhythmia    a-fib  . GERD (gastroesophageal reflux disease)    CONTROLLED W/ PROTONIX  . History of prostate cancer S/P RADIATION  TX  6 YRS AGO  . HTN (hypertension)   . Hyperlipidemia   . Iron deficiency   . Lumbar spondylosis W/ RADICULOPATHY  . PAF (paroxysmal atrial fibrillation) ([place])   . Restless leg syndrome   . Sleep apnea TESTED YRS AGO-- NO CPAP RX GIVEN   PT STATES HE DOES NOT THINK HE STILL HAS SLEEP APNEA--DOES NOT USE CPAP  . Status post placement of cardiac pacemaker DDD-  06-04-10-- LAST CHECK 09-08-10 IN EPIC   SECONDARY TO SYNCOPY AND BRADYCARDIA  . Stroke [facility]) Oct. 14, 2014   light left hand, balance issues, short term memory loss 2014  . SVT (supraventricular tachycardia) ([place])    S/P ABLATION   2008    Past Surgical History:  Procedure Laterality Date   . BACK SURGERY    . CARDIAC ELECTROPHYSIOLOGY STUDY AND ABLATION  2008   FOR SVT  . CARDIAC PACEMAKER PLACEMENT  06-04-10   DDD/ AND REMOVAL LOOR RECORDER  . CHOLECYSTECTOMY  2009  . ESOPHAGOGASTRODUODENOSCOPY N/A 12/12/2014   Procedure: ESOPHAGOGASTRODUODENOSCOPY (EGD);  Surgeon: Wilford Corner, MD;  Location: [facility] ENDOSCOPY;  Service: Endoscopy;  Laterality: N/A;  . INGUINAL HERNIA REPAIR  2005   LEFT/   DONE WITH PENILE PROSTESIS SURG.  . JOINT REPLACEMENT    . KNEE ARTHROSCOPY  06/02/2011   Procedure: ARTHROSCOPY KNEE;  Surgeon: Gearlean Alf;  Location: [place];  Service: Orthopedics;  Laterality: Left;  LEFT KNEE ARTHROSCOPY WITH DEBRIDEMENT  . LOOP RECORDER PLACEMENT  01-30-10  . LUMBAR LAMINECTOMY/ DISKECTOMY/ FUSION  05-19-10   L4 - 5  . PENILE PROSTHESIS IMPLANT  1995  . PROSTATE BIOPSY  2007  . REMOVAL AND PLACEMENT PENILE PROSTHESIS IMPLANT   2005   AND LEFT CORPOROPLASTY /    DONE INGUINAL REPAIR  . REPLACEMENT TOTAL KNEE  1997   RIGHT  . SPINE SURGERY    . TOTAL KNEE ARTHROPLASTY  12/20/2011   Procedure: TOTAL KNEE ARTHROPLASTY;  Surgeon: Gearlean Alf, MD;  Location: [facility];  Service: Orthopedics;  Laterality: Left;  . TOTAL SHOULDER  ARTHROPLASTY Right 06/27/2015   Procedure: REVERSE TOTAL SHOULDER ARTHROPLASTY;  Surgeon: Netta Cedars, MD;  Location: [place];  Service: Orthopedics;  Laterality: Right;  . TRANSURETHRAL RESECTION OF PROSTATE  2006    There were no vitals filed for this visit.      Subjective Assessment - 02/05/16 1512    Subjective Pt c/o back pain of 2/10. Pt reports he feels like he has improved since beginning therapy.    Pertinent History R sided stroke, multiple back injections, hx of prostate cx.    Limitations Lifting;Standing;Walking;House hold activities   Currently in Pain? Yes   Pain Score 2    Pain Location Back   Pain Orientation Right   Pain Descriptors / Indicators Aching   Pain Type Chronic pain   Pain Onset  More than a month ago   Pain Frequency Intermittent   Aggravating Factors  N/A   Pain Relieving Factors resting   Multiple Pain Sites No                         [facility] Adult PT Treatment/Exercise - 02/05/16 0001      Ambulation/Gait   Pre-Gait Activities Parallel bars     Lumbar Exercises: Aerobic   Stationary Bike Nustep L 5 x 5             Balance Exercises - 02/05/16 1527      Balance Exercises: Standing   Standing Eyes Opened Solid surface;30 secs;Other (comment);5 reps;Wide (BOA);Narrow base of support (BOS)   Standing Eyes Closed Foam/compliant surface;10 secs  with CGA   Tandem Stance Eyes open;Eyes closed;3 reps   Other Standing Exercises Tapping on 6 inch step with CGA     OTAGO PROGRAM   Hip ABductor 20 reps   Ankle Plantorflexors --  20 reps   Backwards Walking --  10 feet with CGA   Sideways Walking --  side stepping 10 feet x 4 with CGA   Tandem Walk --  with CGA in parallel bars           PT Education - 02/05/16 1540    Education Details Pt reports fatigue at end of session. Pt edu in energy conservation techniques and deep breathing.     Person(s) Educated Patient   Methods Demonstration;Explanation   Comprehension Verbalized understanding;Returned demonstration          PT Short Term Goals - 02/05/16 1551      PT SHORT TERM GOAL #1   Title pt will be I with inital HEP (01/10/2016)   Time 4   Period Weeks   Status Achieved     PT SHORT TERM GOAL #2   Title pt will be able to verbalize and demonstrate proper posture and lifting and carrying techniques to prevent and reduce low back pain (01/10/2016)   Time 4   Period Weeks   Status Unable to assess     PT SHORT TERM GOAL #3   Title pt will improve bil hip abductor/ extensor strength to >/= 4-/5 with >/= 2/10 pain in the low back to promote functional stability (01/10/2016)   Time 4   Period Weeks   Status On-going     PT SHORT TERM GOAL #4   Title berg balance  testing will be performed and goals well be made   Period Weeks   Status Achieved     PT SHORT TERM GOAL #5   Title pt will improve trunk mobility by >/=  5 degrees in all planes with </= 2/10 pain and to promote functional progression (01/10/2016)   Time 4   Period Weeks   Status Unable to assess           PT Long Term Goals - 01/21/16 1420      PT LONG TERM GOAL #1   Title pt will be I with all HEP given as of last visit (02/05/2016)   Time 8   Period Weeks   Status On-going     PT LONG TERM GOAL #2   Title He will improve trunk flexion and side bending by >/= 10 degrees with </= 1/10 pain </= low guard with no report of feeling like he will fall to promote functional trunk mobility and safety during ADLS ([id number])   Time 8   Period Weeks   Status On-going     PT LONG TERM GOAL #3   Title pt will improve bil hip strength to >/= 4/5 to for functional endurance with walking/ standing and promote safety ( 02/05/2016)   Time 8   Period Weeks   Status On-going     PT LONG TERM GOAL #4   Title pt will be able to walk/ stand for >/= 45 minutes with LRAD with </= 1/10 pain to assist with functional endruance for ADLs and assist with pt's goal of returning to playing golf (02/05/2016)   Time 8   Period Weeks   Status On-going     PT LONG TERM GOAL #5   Title at discharge pt with improve his FOTO score to >/= 72 to demonstrate improvement in function (02/05/2016)   Time 8   Period Weeks   Status On-going     PT LONG TERM GOAL #6   Title pt will improve his Berg balance score to >/= 45/56 to promote safety with walking/ standing and help with pt's goal with returning to playing golf (02/05/2016)   Time 8   Period Weeks   Status Unable to assess               Plan - 02/05/16 1545    Clinical Impression Statement Pt tolerated treatment well requiring 4 rest breaks between exercises. Pt still presenting with increased lateral sway and decreased foot clearence during amb.  Pt would benefit from continued PT to progress strengthening and standing balance as well as gait training.    Rehab Potential Good   PT Frequency 3x / week   PT Duration 8 weeks   PT Treatment/Interventions ADLs/Self Care Home Management;Cryotherapy;Electrical Stimulation;Iontophoresis 4mg /ml Dexamethasone;Moist Heat;Therapeutic exercise;Manual techniques;Vasopneumatic Device;Taping;Dry needling;Ultrasound;Therapeutic activities;Patient/family education;Passive range of motion   PT Next Visit Plan core strengthening, progress balance training PRN, LE strengthening, DF/PF strength, update HEP PRN (HX OF CX and PACEMAKER : NO MHP OR E-STIM)   PT Home Exercise Plan Continue HEP   Consulted and Agree with Plan of Care Patient      Patient will benefit from skilled therapeutic intervention in order to improve the following deficits and impairments:  Pain, Improper body mechanics, Postural dysfunction, Decreased balance, Decreased endurance, Decreased activity tolerance, Difficulty walking, Decreased range of motion, Abnormal gait, Hypomobility, Increased fascial restricitons, Decreased strength, Decreased mobility  Visit Diagnosis: Bilateral low back pain, with sciatica presence unspecified  Muscle weakness (generalized)  Cramp and spasm  Other abnormalities of gait and mobility  Unsteadiness on feet     Problem List Patient Active Problem List   Diagnosis Date Noted  . S/P shoulder replacement 06/27/2015  .  Residual cognitive deficit as late effect of stroke 04/15/2015  . Lung nodule   . Abnormal CXR 12/12/2014  . GI bleed 12/11/2014  . Weakness generalized 12/11/2014  . Leukocytosis 12/11/2014  . Restless leg syndrome 12/11/2014  . Anemia 12/11/2014  . Abnormal EKG 12/11/2014  . Gait disturbance, post-stroke 10/22/2014  . TIA (transient ischemic attack) 06/18/2014  . Partial seizures ([place]) 06/18/2014  . Seizure ([place]) 06/18/2014  . Vitamin B12 deficiency 06/18/2014  . OSA  (obstructive sleep apnea) 02/26/2014  . Cognitive deficits as late effect of cerebrovascular disease 01/11/2014  . Fatigue 09/20/2013  . Flu-like symptoms 06/19/2013  . Intermittent lightheadedness 06/19/2013  . Low blood pressure, not hypotension 06/19/2013  . Influenza with respiratory manifestations 06/19/2013  . Embolic cerebral infarction ([place]) 04/09/2013  . CVA (cerebral infarction) 04/03/2013  . Occlusion and stenosis of carotid artery without mention of cerebral infarction 11/21/2012  . OA (osteoarthritis) of knee 12/20/2011  . Methotrexate, long term, current use 09/10/2011  . Knee pain, left 07/27/2011  . Pacemaker-Medtronic 09/08/2010  . UNSPECIFIED VISUAL DISTURBANCE 07/20/2010  . SYNCOPE 06/01/2010  . IRON DEFIC ANEMIA [place] DIET IRON INTAKE 04/20/2010  . PSORIASIS 04/20/2010  . HEADACHE, PERSISTENT 01/13/2010  . HEMATURIA UNSPECIFIED 11/19/2009  . PHLEBITIS AND THROMBOPHLEBITIS OF OTHER SITE 07/02/2009  . CELLULITIS, LEG, LEFT 07/02/2009  . DISUSE OSTEOPOROSIS 05/12/2009  . HYPERGLYCEMIA, BORDERLINE 01/10/2009  . SPONDYLOSIS, LUMBAR, WITH RADICULOPATHY 10/15/2008  . OTHER SPECIFIED DERMATOMYCOSES 06/24/2008  . EXTRINSIC ASTHMA, WITH EXACERBATION 05/01/2008  . CALCU GALLBLADD W/OTH CHOLECYSTITIS&OBSTRUCTION 02/20/2008  . VARICOSE VEINS LOWER EXTREMITIES W/INFLAMMATION 12/25/2007  . EDEMA 12/25/2007  . INSOMNIA WITH SLEEP APNEA UNSPECIFIED 08/31/2007  . GERD 03/23/2007  . FIBRILLATION, ATRIAL 01/31/2007  . PSORIATIC ARTHRITIS 01/31/2007  . ABDOMINAL BLOATING 01/31/2007  . Hyperlipidemia 01/19/2007  . Essential hypertension 01/19/2007  . PROSTATE CANCER, HX OF 01/19/2007    Oretha Caprice 02/05/2016, 3:54 PM  [facility] [address] [place], [place], [address] Phone: [phone]   Fax:  [phone]  Name: THALMUS BELOTTI MRN: [id number] Date of Birth: 1933-11-28  Kearney Hard, PT 02/05/16 3:54  PM      [place] PT, DPT, LAT, ATC  02/16/16  12:49 PM

## 2016-02-06 DIAGNOSIS — M47816 Spondylosis without myelopathy or radiculopathy, lumbar region: Secondary | ICD-10-CM | POA: Diagnosis not present

## 2016-02-06 DIAGNOSIS — M5136 Other intervertebral disc degeneration, lumbar region: Secondary | ICD-10-CM | POA: Diagnosis not present

## 2016-02-06 NOTE — Telephone Encounter (Signed)
Ashtyn please advise if this form was received.  thanks

## 2016-02-10 ENCOUNTER — Encounter: Payer: PPO | Admitting: Physical Therapy

## 2016-02-10 ENCOUNTER — Ambulatory Visit: Payer: PPO | Admitting: Physical Therapy

## 2016-02-10 DIAGNOSIS — R2681 Unsteadiness on feet: Secondary | ICD-10-CM

## 2016-02-10 DIAGNOSIS — M545 Low back pain: Secondary | ICD-10-CM

## 2016-02-10 DIAGNOSIS — M6281 Muscle weakness (generalized): Secondary | ICD-10-CM

## 2016-02-10 DIAGNOSIS — R2689 Other abnormalities of gait and mobility: Secondary | ICD-10-CM

## 2016-02-10 DIAGNOSIS — R252 Cramp and spasm: Secondary | ICD-10-CM

## 2016-02-10 NOTE — Patient Instructions (Signed)
Hip Flexor Stretch    Lying on back near edge of bed, bend one leg, foot flat. Hang other leg over edge, relaxed, thigh resting entirely on bed for __1__ minutes. Repeat ___2_ times. Do _2___ sessions per day. Advanced Exercise: Bend knee back keeping thigh in contact with bed.-USE BELT

## 2016-02-10 NOTE — Therapy (Signed)
[place], [place], [address] Phone: [phone]   Fax:  [phone]  Physical Therapy Treatment  Patient Details  Name: Christopher Baker MRN: [id number] Date of Birth: 02/02/1934 Referring Provider: Suella Broad Md.  Encounter Date: 02/10/2016      PT End of Session - 02/10/16 1029    Visit Number 15   Number of Visits 17   Date for PT Re-Evaluation 02/05/16   Authorization Type [employer]: Kx mod by 15th visit; progress note/ G code by 20th visit   PT Start Time 1025  10 minutes late   PT Stop Time 1115   PT Time Calculation (min) 50 min      Past Medical History:  Diagnosis Date  . Arthritis with psoriasis ([place])   . Borderline diabetes    DIET CONTROLLED - PT STATES HE IS NOT DIABETIC  . Cancer [facility])    prostate  2008  . Carotid stenosis, bilateral PER DR EARLY / DUPLEX  09-09-10     40 - 59%   BILATERALLY--- ASYMPTOMATIC  . Cataract immature LEFT EYE  . Dysrhythmia    a-fib  . GERD (gastroesophageal reflux disease)    CONTROLLED W/ PROTONIX  . History of prostate cancer S/P RADIATION  TX  6 YRS AGO  . HTN (hypertension)   . Hyperlipidemia   . Iron deficiency   . Lumbar spondylosis W/ RADICULOPATHY  . PAF (paroxysmal atrial fibrillation) ([place])   . Restless leg syndrome   . Sleep apnea TESTED YRS AGO-- NO CPAP RX GIVEN   PT STATES HE DOES NOT THINK HE STILL HAS SLEEP APNEA--DOES NOT USE CPAP  . Status post placement of cardiac pacemaker DDD-  06-04-10-- LAST CHECK 09-08-10 IN EPIC   SECONDARY TO SYNCOPY AND BRADYCARDIA  . Stroke [facility]) Oct. 14, 2014   light left hand, balance issues, short term memory loss 2014  . SVT (supraventricular tachycardia) ([place])    S/P ABLATION   2008    Past Surgical History:  Procedure Laterality Date  . BACK SURGERY    . CARDIAC ELECTROPHYSIOLOGY STUDY AND ABLATION  2008   FOR SVT  . CARDIAC PACEMAKER PLACEMENT  06-04-10   DDD/ AND REMOVAL  LOOR RECORDER  . CHOLECYSTECTOMY  2009  . ESOPHAGOGASTRODUODENOSCOPY N/A 12/12/2014   Procedure: ESOPHAGOGASTRODUODENOSCOPY (EGD);  Surgeon: Wilford Corner, MD;  Location: [facility] ENDOSCOPY;  Service: Endoscopy;  Laterality: N/A;  . INGUINAL HERNIA REPAIR  2005   LEFT/   DONE WITH PENILE PROSTESIS SURG.  . JOINT REPLACEMENT    . KNEE ARTHROSCOPY  06/02/2011   Procedure: ARTHROSCOPY KNEE;  Surgeon: Gearlean Alf;  Location: [place];  Service: Orthopedics;  Laterality: Left;  LEFT KNEE ARTHROSCOPY WITH DEBRIDEMENT  . LOOP RECORDER PLACEMENT  01-30-10  . LUMBAR LAMINECTOMY/ DISKECTOMY/ FUSION  05-19-10   L4 - 5  . PENILE PROSTHESIS IMPLANT  1995  . PROSTATE BIOPSY  2007  . REMOVAL AND PLACEMENT PENILE PROSTHESIS IMPLANT   2005   AND LEFT CORPOROPLASTY /    DONE INGUINAL REPAIR  . REPLACEMENT TOTAL KNEE  1997   RIGHT  . SPINE SURGERY    . TOTAL KNEE ARTHROPLASTY  12/20/2011   Procedure: TOTAL KNEE ARTHROPLASTY;  Surgeon: Gearlean Alf, MD;  Location: [facility];  Service: Orthopedics;  Laterality: Left;  . TOTAL SHOULDER ARTHROPLASTY Right 06/27/2015   Procedure: REVERSE TOTAL SHOULDER ARTHROPLASTY;  Surgeon: Netta Cedars, MD;  Location: [place];  Service:  Orthopedics;  Laterality: Right;  . TRANSURETHRAL RESECTION OF PROSTATE  2006    There were no vitals filed for this visit.      Subjective Assessment - 02/10/16 1027    Subjective hip still bothers me 4.5/10 started during balance exercises and has not stopped.    Currently in Pain? Yes   Pain Score 4    Pain Location Back   Aggravating Factors  exercise, getting up and down, getting out of bed   Pain Relieving Factors resting            [facility] PT Assessment - 02/10/16 0001      AROM   Lumbar Flexion 65   Lumbar - Right Side Bend 20   Lumbar - Left Side Bend 20     Strength   Right Hip ABduction 4-/5   Left Hip ABduction 4-/5                     [facility] Adult PT Treatment/Exercise - 02/10/16  0001      Transfers   Transfers Supine to Sit   Supine to Sit 6: Modified independent (Device/Increase time)   Comments requires multiple attempts and cues for proper technique     Lumbar Exercises: Stretches   Active Hamstring Stretch 3 reps;30 seconds   Active Hamstring Stretch Limitations edge of seat focusing on mechanics   Single Knee to Chest Stretch 2 reps;30 seconds   Piriformis Stretch 2 reps;30 seconds   Piriformis Stretch Limitations also passive hip adductor stretch 2 x 30 sec, also hip flexor stretch edge of mat table with opposite knee to chest      Knee/Hip Exercises: Supine   Heel Slides 5 reps   Heel Slides Limitations increased pain after 3 reps    Hip Adduction Isometric 10 reps   Other Supine Knee/Hip Exercises right bent knee raise- increased pain     Modalities   Modalities Moist Heat     Moist Heat Therapy   Number Minutes Moist Heat 15 Minutes   Moist Heat Location Hip  groin                PT Education - 02/10/16 1100    Education provided Yes   Education Details hip flexor stretch with knee flexion   Person(s) Educated Patient   Methods Explanation;Handout   Comprehension Verbalized understanding          PT Short Term Goals - 02/10/16 1141      PT SHORT TERM GOAL #1   Title pt will be I with inital HEP (01/10/2016)   Status Achieved     PT SHORT TERM GOAL #2   Title pt will be able to verbalize and demonstrate proper posture and lifting and carrying techniques to prevent and reduce low back pain (01/10/2016)   Time 4   Period Weeks   Status Unable to assess     PT SHORT TERM GOAL #3   Title pt will improve bil hip abductor/ extensor strength to >/= 4-/5 with >/= 2/10 pain in the low back to promote functional stability (01/10/2016)   Time 4   Period Weeks   Status Partially Met     PT SHORT TERM GOAL #4   Title berg balance testing will be performed and goals well be made   Status Achieved     PT SHORT TERM GOAL #5    Title pt will improve trunk mobility by >/= 5 degrees in all planes with </=  2/10 pain and to promote functional progression (01/10/2016)   Time 4   Period Weeks   Status Partially Met           PT Long Term Goals - 01/21/16 1420      PT LONG TERM GOAL #1   Title pt will be I with all HEP given as of last visit (02/05/2016)   Time 8   Period Weeks   Status On-going     PT LONG TERM GOAL #2   Title He will improve trunk flexion and side bending by >/= 10 degrees with </= 1/10 pain </= low guard with no report of feeling like he will fall to promote functional trunk mobility and safety during ADLS (12/16/3660)   Time 8   Period Weeks   Status On-going     PT LONG TERM GOAL #3   Title pt will improve bil hip strength to >/= 4/5 to for functional endurance with walking/ standing and promote safety ( 02/05/2016)   Time 8   Period Weeks   Status On-going     PT LONG TERM GOAL #4   Title pt will be able to walk/ stand for >/= 45 minutes with LRAD with </= 1/10 pain to assist with functional endruance for ADLs and assist with pt's goal of returning to playing golf (02/05/2016)   Time 8   Period Weeks   Status On-going     PT LONG TERM GOAL #5   Title at discharge pt with improve his FOTO score to >/= 72 to demonstrate improvement in function (02/05/2016)   Time 8   Period Weeks   Status On-going     PT LONG TERM GOAL #6   Title pt will improve his Berg balance score to >/= 45/56 to promote safety with walking/ standing and help with pt's goal with returning to playing golf (02/05/2016)   Time 8   Period Weeks   Status Unable to assess               Plan - 02/10/16 1126    Clinical Impression Statement Pt 10 minutes late to appointment. He is concerned about the hip pain that began 3 weeks ago when doing balance exercises. Pt has pain with right hip flexion. We added right hip flexor stretch to HEP with good tolerance. Trial of HMP for anterior hip and groin pain. HE reports  using heat at home as well. hip strength improved, trunk mobility improved -see objective measures. STG3 3, #5 Met.    PT Next Visit Plan ankle strength/balance/ CORE/ LE strengthening, (HX OF CX and PACEMAKER) NO HMP OR ESTIM)  , try ice on hip flexor   PT Home Exercise Plan hip flexor stretch      Patient will benefit from skilled therapeutic intervention in order to improve the following deficits and impairments:  Pain, Improper body mechanics, Postural dysfunction, Decreased balance, Decreased endurance, Decreased activity tolerance, Difficulty walking, Decreased range of motion, Abnormal gait, Hypomobility, Increased fascial restricitons, Decreased strength, Decreased mobility  Visit Diagnosis: Bilateral low back pain, with sciatica presence unspecified  Muscle weakness (generalized)  Cramp and spasm  Other abnormalities of gait and mobility  Unsteadiness on feet     Problem List Patient Active Problem List   Diagnosis Date Noted  . S/P shoulder replacement 06/27/2015  . Residual cognitive deficit as late effect of stroke 04/15/2015  . Lung nodule   . Abnormal CXR 12/12/2014  . GI bleed 12/11/2014  . Weakness generalized  12/11/2014  . Leukocytosis 12/11/2014  . Restless leg syndrome 12/11/2014  . Anemia 12/11/2014  . Abnormal EKG 12/11/2014  . Gait disturbance, post-stroke 10/22/2014  . TIA (transient ischemic attack) 06/18/2014  . Partial seizures ([place]) 06/18/2014  . Seizure ([place]) 06/18/2014  . Vitamin B12 deficiency 06/18/2014  . OSA (obstructive sleep apnea) 02/26/2014  . Cognitive deficits as late effect of cerebrovascular disease 01/11/2014  . Fatigue 09/20/2013  . Flu-like symptoms 06/19/2013  . Intermittent lightheadedness 06/19/2013  . Low blood pressure, not hypotension 06/19/2013  . Influenza with respiratory manifestations 06/19/2013  . Embolic cerebral infarction ([place]) 04/09/2013  . CVA (cerebral infarction) 04/03/2013  . Occlusion and stenosis of  carotid artery without mention of cerebral infarction 11/21/2012  . OA (osteoarthritis) of knee 12/20/2011  . Methotrexate, long term, current use 09/10/2011  . Knee pain, left 07/27/2011  . Pacemaker-Medtronic 09/08/2010  . UNSPECIFIED VISUAL DISTURBANCE 07/20/2010  . SYNCOPE 06/01/2010  . IRON DEFIC ANEMIA [place] DIET IRON INTAKE 04/20/2010  . PSORIASIS 04/20/2010  . HEADACHE, PERSISTENT 01/13/2010  . HEMATURIA UNSPECIFIED 11/19/2009  . PHLEBITIS AND THROMBOPHLEBITIS OF OTHER SITE 07/02/2009  . CELLULITIS, LEG, LEFT 07/02/2009  . DISUSE OSTEOPOROSIS 05/12/2009  . HYPERGLYCEMIA, BORDERLINE 01/10/2009  . SPONDYLOSIS, LUMBAR, WITH RADICULOPATHY 10/15/2008  . OTHER SPECIFIED DERMATOMYCOSES 06/24/2008  . EXTRINSIC ASTHMA, WITH EXACERBATION 05/01/2008  . CALCU GALLBLADD W/OTH CHOLECYSTITIS&OBSTRUCTION 02/20/2008  . VARICOSE VEINS LOWER EXTREMITIES W/INFLAMMATION 12/25/2007  . EDEMA 12/25/2007  . INSOMNIA WITH SLEEP APNEA UNSPECIFIED 08/31/2007  . GERD 03/23/2007  . FIBRILLATION, ATRIAL 01/31/2007  . PSORIATIC ARTHRITIS 01/31/2007  . ABDOMINAL BLOATING 01/31/2007  . Hyperlipidemia 01/19/2007  . Essential hypertension 01/19/2007  . PROSTATE CANCER, HX OF 01/19/2007    Dorene Ar, PTA 02/10/2016, 11:42 AM  [facility] [address] [place], [place], [address] Phone: [phone]   Fax:  [phone]  Name: Christopher Baker MRN: [id number] Date of Birth: 23-Sep-1933

## 2016-02-10 NOTE — Telephone Encounter (Signed)
I have not seen this form come through Dr Juanetta Gosling cubby.  Dr Halford Chessman has not given me anything regarding this patient.

## 2016-02-10 NOTE — Telephone Encounter (Signed)
Called and spoke with Jeani Hawking at [facility] and she is faxing this form now.  Will hold in triage until form is received.

## 2016-02-11 ENCOUNTER — Other Ambulatory Visit: Payer: Self-pay | Admitting: Internal Medicine

## 2016-02-11 NOTE — Telephone Encounter (Signed)
Christopher Baker please advise if you have seen this form. Thanks.

## 2016-02-11 NOTE — Telephone Encounter (Signed)
Spoke with Jenny Reichmann at [place] and he states will have the form faxed  Will hold until received

## 2016-02-11 NOTE — Telephone Encounter (Signed)
No we have not received this form, there is no form in his cubby

## 2016-02-12 ENCOUNTER — Other Ambulatory Visit: Payer: Self-pay | Admitting: *Deleted

## 2016-02-12 ENCOUNTER — Ambulatory Visit: Payer: PPO | Admitting: Physical Therapy

## 2016-02-12 DIAGNOSIS — R252 Cramp and spasm: Secondary | ICD-10-CM

## 2016-02-12 DIAGNOSIS — M545 Low back pain: Secondary | ICD-10-CM | POA: Diagnosis not present

## 2016-02-12 DIAGNOSIS — R2689 Other abnormalities of gait and mobility: Secondary | ICD-10-CM

## 2016-02-12 DIAGNOSIS — R2681 Unsteadiness on feet: Secondary | ICD-10-CM

## 2016-02-12 DIAGNOSIS — M6281 Muscle weakness (generalized): Secondary | ICD-10-CM

## 2016-02-12 MED ORDER — FUROSEMIDE 20 MG PO TABS: 40.0000 mg | ORAL_TABLET | Freq: Every day | ORAL | 9 refills | Status: DC

## 2016-02-12 NOTE — Therapy (Signed)
[place], [place], [address] Phone: [phone]   Fax:  [phone]  Physical Therapy Treatment  Patient Details  Name: Christopher Baker MRN: [id number] Date of Birth: 10-20-1933 Referring Provider: Suella Broad Md.  Encounter Date: 02/12/2016      PT End of Session - 02/12/16 1020    Visit Number 16   Number of Visits 17   Date for PT Re-Evaluation 02/05/16   Authorization Type [employer]: Kx mod by 15th visit; progress note/ G code by 20th visit   PT Start Time 1018   PT Stop Time 1059   PT Time Calculation (min) 41 min      Past Medical History:  Diagnosis Date  . Arthritis with psoriasis ([place])   . Borderline diabetes    DIET CONTROLLED - PT STATES HE IS NOT DIABETIC  . Cancer [facility])    prostate  2008  . Carotid stenosis, bilateral PER DR EARLY / DUPLEX  09-09-10     40 - 59%   BILATERALLY--- ASYMPTOMATIC  . Cataract immature LEFT EYE  . Dysrhythmia    a-fib  . GERD (gastroesophageal reflux disease)    CONTROLLED W/ PROTONIX  . History of prostate cancer S/P RADIATION  [place]  6 YRS AGO  . HTN (hypertension)   . Hyperlipidemia   . Iron deficiency   . Lumbar spondylosis W/ RADICULOPATHY  . PAF (paroxysmal atrial fibrillation) ([place])   . Restless leg syndrome   . Sleep apnea TESTED YRS AGO-- NO CPAP RX GIVEN   PT STATES HE DOES NOT THINK HE STILL HAS SLEEP APNEA--DOES NOT USE CPAP  . Status post placement of cardiac pacemaker DDD-  06-04-10-- LAST CHECK 09-08-10 IN EPIC   SECONDARY TO SYNCOPY AND BRADYCARDIA  . Stroke [facility]) Oct. 14, 2014   light left hand, balance issues, short term memory loss 2014  . SVT (supraventricular tachycardia) ([place])    S/P ABLATION   2008    Past Surgical History:  Procedure Laterality Date  . BACK SURGERY    . CARDIAC ELECTROPHYSIOLOGY STUDY AND ABLATION  2008   FOR SVT  . CARDIAC PACEMAKER PLACEMENT  06-04-10   DDD/ AND REMOVAL LOOR RECORDER  .  CHOLECYSTECTOMY  2009  . ESOPHAGOGASTRODUODENOSCOPY N/A 12/12/2014   Procedure: ESOPHAGOGASTRODUODENOSCOPY (EGD);  Surgeon: Wilford Corner, MD;  Location: [facility] ENDOSCOPY;  Service: Endoscopy;  Laterality: N/A;  . INGUINAL HERNIA REPAIR  2005   LEFT/   DONE WITH PENILE PROSTESIS SURG.  . JOINT REPLACEMENT    . KNEE ARTHROSCOPY  06/02/2011   Procedure: ARTHROSCOPY KNEE;  Surgeon: Gearlean Alf;  Location: [place];  Service: Orthopedics;  Laterality: Left;  LEFT KNEE ARTHROSCOPY WITH DEBRIDEMENT  . LOOP RECORDER PLACEMENT  01-30-10  . LUMBAR LAMINECTOMY/ DISKECTOMY/ FUSION  05-19-10   L4 - 5  . PENILE PROSTHESIS IMPLANT  1995  . PROSTATE BIOPSY  2007  . REMOVAL AND PLACEMENT PENILE PROSTHESIS IMPLANT   2005   AND LEFT CORPOROPLASTY /    DONE INGUINAL REPAIR  . REPLACEMENT TOTAL KNEE  1997   RIGHT  . SPINE SURGERY    . TOTAL KNEE ARTHROPLASTY  12/20/2011   Procedure: TOTAL KNEE ARTHROPLASTY;  Surgeon: Gearlean Alf, MD;  [facility];  Service: Orthopedics;  Laterality: Left;  . TOTAL SHOULDER ARTHROPLASTY Right 06/27/2015   Procedure: REVERSE TOTAL SHOULDER ARTHROPLASTY;  Surgeon: Netta Cedars, MD;  Location: [place];  Service: Orthopedics;  Laterality: Right;  .  TRANSURETHRAL RESECTION OF PROSTATE  2006    There were no vitals filed for this visit.      Subjective Assessment - 02/12/16 1050    Subjective Hip is still the same.    Currently in Pain? Yes   Pain Score 2    Pain Location Hip  and back                         [facility] Adult PT Treatment/Exercise - 02/12/16 0001      Lumbar Exercises: Stretches   Piriformis Stretch Limitations also passive hip adductor stretch 2 x 30 sec, also hip flexor stretch edge of mat table and knee flexion over pressue     Manual Therapy   Manual Therapy Soft tissue mobilization   Soft tissue mobilization Proximal hip flexor and adductors, into thigh             Balance Exercises - 02/12/16 1053       Balance Exercises: Standing   SLS with Vectors Solid surface  3 way hip bilateral with No UE assist CGA x 10 reps each bil   Tandem Gait Forward;3 reps   Retro Gait 2 reps   Other Standing Exercises Alternating and unilateral step taps 6 inch step, side stepping no UE support             PT Short Term Goals - 02/10/16 1141      PT SHORT TERM GOAL #1   Title pt will be I with inital HEP (01/10/2016)   Status Achieved     PT SHORT TERM GOAL #2   Title pt will be able to verbalize and demonstrate proper posture and lifting and carrying techniques to prevent and reduce low back pain (01/10/2016)   Time 4   Period Weeks   Status Unable to assess     PT SHORT TERM GOAL #3   Title pt will improve bil hip abductor/ extensor strength to >/= 4-/5 with >/= 2/10 pain in the low back to promote functional stability (01/10/2016)   Time 4   Period Weeks   Status Partially Met     PT SHORT TERM GOAL #4   Title berg balance testing will be performed and goals well be made   Status Achieved     PT SHORT TERM GOAL #5   Title pt will improve trunk mobility by >/= 5 degrees in all planes with </= 2/10 pain and to promote functional progression (01/10/2016)   Time 4   Period Weeks   Status Partially Met           PT Long Term Goals - 01/21/16 1420      PT LONG TERM GOAL #1   Title pt will be I with all HEP given as of last visit (02/05/2016)   Time 8   Period Weeks   Status On-going     PT LONG TERM GOAL #2   Title He will improve trunk flexion and side bending by >/= 10 degrees with </= 1/10 pain </= low guard with no report of feeling like he will fall to promote functional trunk mobility and safety during ADLS (0/31/5945)   Time 8   Period Weeks   Status On-going     PT LONG TERM GOAL #3   Title pt will improve bil hip strength to >/= 4/5 to for functional endurance with walking/ standing and promote safety ( 02/05/2016)   Time 8   Period Weeks  Status On-going     PT  LONG TERM GOAL #4   Title pt will be able to walk/ stand for >/= 45 minutes with LRAD with </= 1/10 pain to assist with functional endruance for ADLs and assist with pt's goal of returning to playing golf (02/05/2016)   Time 8   Period Weeks   Status On-going     PT LONG TERM GOAL #5   Title at discharge pt with improve his FOTO score to >/= 72 to demonstrate improvement in function (02/05/2016)   Time 8   Period Weeks   Status On-going     PT LONG TERM GOAL #6   Title pt will improve his Berg balance score to >/= 45/56 to promote safety with walking/ standing and help with pt's goal with returning to playing golf (02/05/2016)   Time 8   Period Weeks   Status Unable to assess               Plan - 02/12/16 1120    Clinical Impression Statement Pt reprots no change in hip pain however he rates his pain less. Soft tisuue work and passive right hip stretching focused today with pt reporting no hip pain at end of treatment. We also worked on balance in the parallel bars focusing on no UE support.    PT Next Visit Plan FOTO //Assess groin pain,  ankle strength/balance/ CORE/ LE strengthening, (HX OF CX and PACEMAKER) NO HMP OR ESTIM)  , try ice on hip flexor, manual to hip flexor and adductors   PT Home Exercise Plan hip flexor stretch      Patient will benefit from skilled therapeutic intervention in order to improve the following deficits and impairments:  Pain, Improper body mechanics, Postural dysfunction, Decreased balance, Decreased endurance, Decreased activity tolerance, Difficulty walking, Decreased range of motion, Abnormal gait, Hypomobility, Increased fascial restricitons, Decreased strength, Decreased mobility  Visit Diagnosis: Bilateral low back pain, with sciatica presence unspecified  Muscle weakness (generalized)  Cramp and spasm  Other abnormalities of gait and mobility  Unsteadiness on feet     Problem List Patient Active Problem List   Diagnosis Date  Noted  . S/P shoulder replacement 06/27/2015  . Residual cognitive deficit as late effect of stroke 04/15/2015  . Lung nodule   . Abnormal CXR 12/12/2014  . GI bleed 12/11/2014  . Weakness generalized 12/11/2014  . Leukocytosis 12/11/2014  . Restless leg syndrome 12/11/2014  . Anemia 12/11/2014  . Abnormal EKG 12/11/2014  . Gait disturbance, post-stroke 10/22/2014  . TIA (transient ischemic attack) 06/18/2014  . Partial seizures ([place]) 06/18/2014  . Seizure ([place]) 06/18/2014  . Vitamin B12 deficiency 06/18/2014  . OSA (obstructive sleep apnea) 02/26/2014  . Cognitive deficits as late effect of cerebrovascular disease 01/11/2014  . Fatigue 09/20/2013  . Flu-like symptoms 06/19/2013  . Intermittent lightheadedness 06/19/2013  . Low blood pressure, not hypotension 06/19/2013  . Influenza with respiratory manifestations 06/19/2013  . Embolic cerebral infarction ([place]) 04/09/2013  . CVA (cerebral infarction) 04/03/2013  . Occlusion and stenosis of carotid artery without mention of cerebral infarction 11/21/2012  . OA (osteoarthritis) of knee 12/20/2011  . Methotrexate, long term, current use 09/10/2011  . Knee pain, left 07/27/2011  . Pacemaker-Medtronic 09/08/2010  . UNSPECIFIED VISUAL DISTURBANCE 07/20/2010  . SYNCOPE 06/01/2010  . IRON DEFIC ANEMIA [place] DIET IRON INTAKE 04/20/2010  . PSORIASIS 04/20/2010  . HEADACHE, PERSISTENT 01/13/2010  . HEMATURIA UNSPECIFIED 11/19/2009  . PHLEBITIS AND THROMBOPHLEBITIS OF OTHER SITE 07/02/2009  .  CELLULITIS, LEG, LEFT 07/02/2009  . DISUSE OSTEOPOROSIS 05/12/2009  . HYPERGLYCEMIA, BORDERLINE 01/10/2009  . SPONDYLOSIS, LUMBAR, WITH RADICULOPATHY 10/15/2008  . OTHER SPECIFIED DERMATOMYCOSES 06/24/2008  . EXTRINSIC ASTHMA, WITH EXACERBATION 05/01/2008  . CALCU GALLBLADD W/OTH CHOLECYSTITIS&OBSTRUCTION 02/20/2008  . VARICOSE VEINS LOWER EXTREMITIES W/INFLAMMATION 12/25/2007  . EDEMA 12/25/2007  . INSOMNIA WITH SLEEP APNEA UNSPECIFIED  08/31/2007  . GERD 03/23/2007  . FIBRILLATION, ATRIAL 01/31/2007  . PSORIATIC ARTHRITIS 01/31/2007  . ABDOMINAL BLOATING 01/31/2007  . Hyperlipidemia 01/19/2007  . Essential hypertension 01/19/2007  . PROSTATE CANCER, HX OF 01/19/2007    Dorene Ar, PTA 02/12/2016, 11:25 AM  [place], [place], [address] Phone: [phone]   Fax:  [phone]  Name: HEBERTO STURDEVANT MRN: [id number] Date of Birth: 01-02-1934

## 2016-02-12 NOTE — Telephone Encounter (Signed)
Rodena Piety, have you received a CMN for this patient?  Please advise.

## 2016-02-13 MED ORDER — FUROSEMIDE 20 MG PO TABS: 40.0000 mg | ORAL_TABLET | Freq: Every day | ORAL | 9 refills | Status: DC

## 2016-02-13 NOTE — Addendum Note (Signed)
Addended by: Derl Barrow on: 02/13/2016 08:09 AM   Modules accepted: Orders

## 2016-02-16 ENCOUNTER — Ambulatory Visit: Payer: PPO | Admitting: Physical Therapy

## 2016-02-16 DIAGNOSIS — R2681 Unsteadiness on feet: Secondary | ICD-10-CM

## 2016-02-16 DIAGNOSIS — M6281 Muscle weakness (generalized): Secondary | ICD-10-CM

## 2016-02-16 DIAGNOSIS — R2689 Other abnormalities of gait and mobility: Secondary | ICD-10-CM

## 2016-02-16 DIAGNOSIS — M545 Low back pain: Secondary | ICD-10-CM

## 2016-02-16 DIAGNOSIS — R252 Cramp and spasm: Secondary | ICD-10-CM

## 2016-02-16 NOTE — Therapy (Signed)
[place], [place], [address] Phone: [phone]   Fax:  [phone]  Physical Therapy Treatment  Patient Details  Name: Christopher Baker MRN: [id number] Date of Birth: 1933-08-01 Referring Provider: Suella Broad Md.  Encounter Date: 02/16/2016      PT End of Session - 02/16/16 1354    Visit Number 17   Number of Visits 27   Date for PT Re-Evaluation 03/29/16   Authorization Type [employer]: Kx mod by 15th visit; progress note/ G code by 20th visit   PT Start Time 0128   PT Stop Time 0225   PT Time Calculation (min) 57 min      Past Medical History:  Diagnosis Date  . Arthritis with psoriasis ([place])   . Borderline diabetes    DIET CONTROLLED - PT STATES HE IS NOT DIABETIC  . Cancer [facility])    prostate  2008  . Carotid stenosis, bilateral PER DR EARLY / DUPLEX  09-09-10     40 - 59%   BILATERALLY--- ASYMPTOMATIC  . Cataract immature LEFT EYE  . Dysrhythmia    a-fib  . GERD (gastroesophageal reflux disease)    CONTROLLED W/ PROTONIX  . History of prostate cancer S/P RADIATION  TX  6 YRS AGO  . HTN (hypertension)   . Hyperlipidemia   . Iron deficiency   . Lumbar spondylosis W/ RADICULOPATHY  . PAF (paroxysmal atrial fibrillation) ([place])   . Restless leg syndrome   . Sleep apnea TESTED YRS AGO-- NO CPAP RX GIVEN   PT STATES HE DOES NOT THINK HE STILL HAS SLEEP APNEA--DOES NOT USE CPAP  . Status post placement of cardiac pacemaker DDD-  06-04-10-- LAST CHECK 09-08-10 IN EPIC   SECONDARY TO SYNCOPY AND BRADYCARDIA  . Stroke [facility]) Oct. 14, 2014   light left hand, balance issues, short term memory loss 2014  . SVT (supraventricular tachycardia) ([place])    S/P ABLATION   2008    Past Surgical History:  Procedure Laterality Date  . BACK SURGERY    . CARDIAC ELECTROPHYSIOLOGY STUDY AND ABLATION  2008   FOR SVT  . CARDIAC PACEMAKER PLACEMENT  06-04-10   DDD/ AND REMOVAL LOOR RECORDER  .  CHOLECYSTECTOMY  2009  . ESOPHAGOGASTRODUODENOSCOPY N/A 12/12/2014   Procedure: ESOPHAGOGASTRODUODENOSCOPY (EGD);  Surgeon: Wilford Corner, MD;  Location[facility] END[facility]COPY;  Service: Endoscopy;  Laterality: N/A;  . INGUINAL HERNIA REPAIR  2005   LEFT/   DONE WITH PENILE PROSTESIS SURG.  . JOINT REPLACEMENT    . KNEE ARTHROSCOPY  06/02/2011   Procedure: ARTHROSCOPY KNEE;  Surgeon: Gearlean Alf;  Location: [place];  Service: Orthopedics;  Laterality: Left;  LEFT KNEE ARTHROSCOPY WITH DEBRIDEMENT  . LOOP RECORDER PLACEMENT  01-30-10  . LUMBAR LAMINECTOMY/ DISKECTOMY/ FUSION  05-19-10   L4 - 5  . PENILE PROSTHESIS IMPLANT  1995  . PROSTATE BIOPSY  2007  . REMOVAL AND PLACEMENT PENILE PROSTHESIS IMPLANT   2005   AND LEFT CORPOROPLASTY /    DONE INGUINAL REPAIR  . REPLACEMENT TOTAL KNEE  1997   RIGHT  . SPINE SURGERY    . TOTAL KNEE ARTHROPLASTY  12/20/2011   Procedure: TOTAL KNEE ARTHROPLASTY;  Surgeon: Gearlean Alf, MD;  Location: [facility];  Service: Orthopedics;  Laterality: Left;  . TOTAL SHOULDER ARTHROPLASTY Right 06/27/2015   Procedure: REVERSE TOTAL SHOULDER ARTHROPLASTY;  Surgeon: Netta Cedars, MD;  Location: [place];  Service: Orthopedics;  Laterality: Right;  .  TRANSURETHRAL RESECTION OF PROSTATE  2006    There were no vitals filed for this visit.      Subjective Assessment - 02/16/16 1406    Subjective back is not hurting ,much today. Still can go up to 6/10. Hip hurts when I bend it.             [facility] PT Assessment - 02/16/16 0001      Strength   Right Hip Extension 4-/5   Right Hip ABduction 4-/5   Left Hip Extension 4-/5   Left Hip ABduction 4-/5     Berg Balance Test   Sit to Stand Able to stand without using hands and stabilize independently   Standing Unsupported Able to stand safely 2 minutes   Sitting with Back Unsupported but Feet Supported on Floor or Stool Able to sit safely and securely 2 minutes   Stand to Sit Sits safely with minimal  use of hands   Transfers Able to transfer safely, minor use of hands   Standing Unsupported with Eyes Closed Able to stand 10 seconds safely   Standing Ubsupported with Feet Together Able to place feet together independently and stand 1 minute safely   From Standing, Reach Forward with Outstretched Arm Can reach forward >12 cm safely (5")   From Standing Position, Pick up Object from Floor Able to pick up shoe safely and easily   From Standing Position, Turn to Look Behind Over each Shoulder Looks behind one side only/other side shows less weight shift   Turn 360 Degrees Able to turn 360 degrees safely but slowly   Standing Unsupported, Alternately Place Feet on Step/Stool Able to stand independently and safely and complete 8 steps in 20 seconds   Standing Unsupported, One Foot in Front Able to plae foot ahead of the other independently and hold 30 seconds   Standing on One Leg Tries to lift leg/unable to hold 3 seconds but remains standing independently   Total Score 48                     [facility] Adult PT Treatment/Exercise - 02/16/16 0001      Therapeutic Activites    Therapeutic Activities Lifting   Lifting pt able to return demonstration of lifting knee level to waist level also ankle level to waist level with good body mechanics.      Lumbar Exercises: Stretches   Pelvic Tilt 10 seconds;5 reps     Lumbar Exercises: Aerobic   Stationary Bike Nustep L 5 x 5     Lumbar Exercises: Supine   Clam 20 reps     Lumbar Exercises: Prone   Straight Leg Raise 20 reps     Knee/Hip Exercises: Sidelying   Hip ABduction 20 reps   Clams x 10x2 and x 10x2 reverse clam                  PT Short Term Goals - 02/16/16 1421      PT SHORT TERM GOAL #1   Title pt will be I with inital HEP (01/10/2016)   Time 4   Period Weeks   Status Achieved     PT SHORT TERM GOAL #2   Title pt will be able to verbalize and demonstrate proper posture and lifting and carrying  techniques to prevent and reduce low back pain (01/10/2016)   Time 4   Period Weeks   Status Achieved     PT SHORT TERM GOAL #3  Title pt will improve bil hip abductor/ extensor strength to >/= 4-/5 with >/= 2/10 pain in the low back to promote functional stability (01/10/2016)   Baseline pain on right low back with testing   Time 4   Period Weeks   Status Partially Met     PT SHORT TERM GOAL #4   Title berg balance testing will be performed and goals well be made   Time 2   Period Weeks   Status Achieved     PT SHORT TERM GOAL #5   Title pt will improve trunk mobility by >/= 5 degrees in all planes with </= 2/10 pain and to promote functional progression (01/10/2016)   Time 4   Period Weeks   Status Achieved           PT Long Term Goals - 02/16/16 1431      PT LONG TERM GOAL #1   Title pt will be I with all HEP given as of last visit (02/05/2016)   Time 8   Period Weeks   Status On-going     PT LONG TERM GOAL #2   Title He will improve trunk flexion and side bending by >/= 10 degrees with </= 1/10 pain </= low guard with no report of feeling like he will fall to promote functional trunk mobility and safety during ADLS (2/56/3893)   Time 8   Period Weeks   Status Partially Met     PT LONG TERM GOAL #3   Title pt will improve bil hip strength to >/= 4/5 to for functional endurance with walking/ standing and promote safety ( 02/05/2016)   Time 8   Period Weeks   Status On-going     PT LONG TERM GOAL #4   Title pt will be able to walk/ stand for >/= 45 minutes with LRAD with </= 1/10 pain to assist with functional endruance for ADLs and assist with pt's goal of returning to playing golf (02/05/2016)   Baseline 15 minutes max   Time 8   Period Weeks   Status On-going     PT LONG TERM GOAL #5   Title at discharge pt with improve his FOTO score to >/= 72 to demonstrate improvement in function (02/05/2016)   Time 8   Period Weeks   Status Unable to assess     PT LONG  TERM GOAL #6   Title pt will improve his Berg balance score to >/= 45/56 to promote safety with walking/ standing and help with pt's goal with returning to playing golf (02/05/2016)   Time 8   Period Weeks   Status Achieved               Plan - 02/16/16 1407    Clinical Impression Statement pt reports manual to hip helpful for short period of time after last visit. He plans to talk to MD about hip pain. He has never had an xray on his hip. His overall back pain has improved to allow 15 minutes of walking or standing prior to low back pain rising to 6/10. He has improved his BERG score from 30/56 on initial eval to 48/56 today. Hip hip strength and improved to 4-/5 for bilateral abduction and extension. He can demonstrate proper lifting from ankle level.    PT Next Visit Plan did pt call MD regarding hip? FOTO and GCode Soon/  continue Core/LE strengthening, SLS and tandem balance exercises ( HX of CX and PACEMAKER)  Patient will benefit from skilled therapeutic intervention in order to improve the following deficits and impairments:  Pain, Improper body mechanics, Postural dysfunction, Decreased balance, Decreased endurance, Decreased activity tolerance, Difficulty walking, Decreased range of motion, Abnormal gait, Hypomobility, Increased fascial restricitons, Decreased strength, Decreased mobility  Visit Diagnosis: Bilateral low back pain, with sciatica presence unspecified  Muscle weakness (generalized)  Cramp and spasm  Other abnormalities of gait and mobility  Unsteadiness on feet     Problem List Patient Active Problem List   Diagnosis Date Noted  . S/P shoulder replacement 06/27/2015  . Residual cognitive deficit as late effect of stroke 04/15/2015  . Lung nodule   . Abnormal CXR 12/12/2014  . GI bleed 12/11/2014  . Weakness generalized 12/11/2014  . Leukocytosis 12/11/2014  . Restless leg syndrome 12/11/2014  . Anemia 12/11/2014  . Abnormal EKG 12/11/2014   . Gait disturbance, post-stroke 10/22/2014  . TIA (transient ischemic attack) 06/18/2014  . Partial seizures ([place]) 06/18/2014  . Seizure ([place]) 06/18/2014  . Vitamin B12 deficiency 06/18/2014  . OSA (obstructive sleep apnea) 02/26/2014  . Cognitive deficits as late effect of cerebrovascular disease 01/11/2014  . Fatigue 09/20/2013  . Flu-like symptoms 06/19/2013  . Intermittent lightheadedness 06/19/2013  . Low blood pressure, not hypotension 06/19/2013  . Influenza with respiratory manifestations 06/19/2013  . Embolic cerebral infarction ([place]) 04/09/2013  . CVA (cerebral infarction) 04/03/2013  . Occlusion and stenosis of carotid artery without mention of cerebral infarction 11/21/2012  . OA (osteoarthritis) of knee 12/20/2011  . Methotrexate, long term, current use 09/10/2011  . Knee pain, left 07/27/2011  . Pacemaker-Medtronic 09/08/2010  . UNSPECIFIED VISUAL DISTURBANCE 07/20/2010  . SYNCOPE 06/01/2010  . IRON DEFIC ANEMIA [place] DIET IRON INTAKE 04/20/2010  . PSORIASIS 04/20/2010  . HEADACHE, PERSISTENT 01/13/2010  . HEMATURIA UNSPECIFIED 11/19/2009  . PHLEBITIS AND THROMBOPHLEBITIS OF OTHER SITE 07/02/2009  . CELLULITIS, LEG, LEFT 07/02/2009  . DISUSE OSTEOPOROSIS 05/12/2009  . HYPERGLYCEMIA, BORDERLINE 01/10/2009  . SPONDYLOSIS, LUMBAR, WITH RADICULOPATHY 10/15/2008  . OTHER SPECIFIED DERMATOMYCOSES 06/24/2008  . EXTRINSIC ASTHMA, WITH EXACERBATION 05/01/2008  . CALCU GALLBLADD W/OTH CHOLECYSTITIS&OBSTRUCTION 02/20/2008  . VARICOSE VEINS LOWER EXTREMITIES W/INFLAMMATION 12/25/2007  . EDEMA 12/25/2007  . INSOMNIA WITH SLEEP APNEA UNSPECIFIED 08/31/2007  . GERD 03/23/2007  . FIBRILLATION, ATRIAL 01/31/2007  . PSORIATIC ARTHRITIS 01/31/2007  . ABDOMINAL BLOATING 01/31/2007  . Hyperlipidemia 01/19/2007  . Essential hypertension 01/19/2007  . PROSTATE CANCER, HX OF 01/19/2007    Dorene Ar, PTA 02/16/2016, 2:39 PM  [facility] [address] [place], [place], [address] Phone: [phone]   Fax:  [phone]  Name: LOWERY PAULLIN MRN: [id number] Date of Birth: March 28, 1934

## 2016-02-16 NOTE — Addendum Note (Signed)
Addended by: Larey Days on: 02/16/2016 12:50 PM   Modules accepted: Orders

## 2016-02-17 ENCOUNTER — Encounter: Payer: PPO | Admitting: Physical Therapy

## 2016-02-17 NOTE — Telephone Encounter (Signed)
This order was in Dr. Juanetta Gosling CMN folder and he signed it 02/12/2016 and it was faxed 02/16/2016 to [facility]

## 2016-02-19 ENCOUNTER — Ambulatory Visit: Payer: PPO | Admitting: Physical Therapy

## 2016-02-19 DIAGNOSIS — M6281 Muscle weakness (generalized): Secondary | ICD-10-CM

## 2016-02-19 DIAGNOSIS — R2681 Unsteadiness on feet: Secondary | ICD-10-CM

## 2016-02-19 DIAGNOSIS — M545 Low back pain: Secondary | ICD-10-CM | POA: Diagnosis not present

## 2016-02-19 DIAGNOSIS — R2689 Other abnormalities of gait and mobility: Secondary | ICD-10-CM

## 2016-02-19 DIAGNOSIS — R252 Cramp and spasm: Secondary | ICD-10-CM

## 2016-02-19 NOTE — Therapy (Signed)
[place], [place], [address] Phone: [phone]   Fax:  [phone]  Physical Therapy Treatment  Patient Details  Name: Christopher Baker MRN: [id number] Date of Birth: 1933/11/27 Referring Provider: Suella Broad Md.  Encounter Date: 02/19/2016      PT End of Session - 02/19/16 1303    Visit Number 18   Number of Visits 27   Date for PT Re-Evaluation 03/29/16   Authorization Type [employer]: Kx mod by 15th visit; progress note/ G code by 20th visit   PT Start Time 1022   PT Stop Time 1100   PT Time Calculation (min) 38 min      Past Medical History:  Diagnosis Date  . Arthritis with psoriasis ([place])   . Borderline diabetes    DIET CONTROLLED - PT STATES HE IS NOT DIABETIC  . Cancer [facility])    prostate  2008  . Carotid stenosis, bilateral PER DR EARLY / DUPLEX  09-09-10     40 - 59%   BILATERALLY--- ASYMPTOMATIC  . Cataract immature LEFT EYE  . Dysrhythmia    a-fib  . GERD (gastroesophageal reflux disease)    CONTROLLED W/ PROTONIX  . History of prostate cancer S/P RADIATION  TX  6 YRS AGO  . HTN (hypertension)   . Hyperlipidemia   . Iron deficiency   . Lumbar spondylosis W/ RADICULOPATHY  . PAF (paroxysmal atrial fibrillation) ([place])   . Restless leg syndrome   . Sleep apnea TESTED YRS AGO-- NO CPAP RX GIVEN   PT STATES HE DOES NOT THINK HE STILL HAS SLEEP APNEA--DOES NOT USE CPAP  . Status post placement of cardiac pacemaker DDD-  06-04-10-- LAST CHECK 09-08-10 IN EPIC   SECONDARY TO SYNCOPY AND BRADYCARDIA  . Stroke [facility]) Oct. 14, 2014   light left hand, balance issues, short term memory loss 2014  . SVT (supraventricular tachycardia) ([place])    S/P ABLATION   2008    Past Surgical History:  Procedure Laterality Date  . BACK SURGERY    . CARDIAC ELECTROPHYSIOLOGY STUDY AND ABLATION  2008   FOR SVT  . CARDIAC PACEMAKER PLACEMENT  06-04-10   DDD/ AND REMOVAL LOOR RECORDER  .  CHOLECYSTECTOMY  2009  . ESOPHAGOGASTRODUODENOSCOPY N/A 12/12/2014   Procedure: ESOPHAGOGASTRODUODENOSCOPY (EGD);  Surgeon: Wilford Corner, MD;  Location: [facility] ENDOSCOPY;  Service: Endoscopy;  Laterality: N/A;  . INGUINAL HERNIA REPAIR  2005   LEFT/   DONE WITH PENILE PROSTESIS SURG.  . JOINT REPLACEMENT    . KNEE ARTHROSCOPY  06/02/2011   Procedure: ARTHROSCOPY KNEE;  Surgeon: Gearlean Alf;  Location: [place];  Service: Orthopedics;  Laterality: Left;  LEFT KNEE ARTHROSCOPY WITH DEBRIDEMENT  . LOOP RECORDER PLACEMENT  01-30-10  . LUMBAR LAMINECTOMY/ DISKECTOMY/ FUSION  05-19-10   L4 - 5  . PENILE PROSTHESIS IMPLANT  1995  . PROSTATE BIOPSY  2007  . REMOVAL AND PLACEMENT PENILE PROSTHESIS IMPLANT   2005   AND LEFT CORPOROPLASTY /    DONE INGUINAL REPAIR  . REPLACEMENT TOTAL KNEE  1997   RIGHT  . SPINE SURGERY    . TOTAL KNEE ARTHROPLASTY  12/20/2011   Procedure: TOTAL KNEE ARTHROPLASTY;  Surgeon: Gearlean Alf, MD;  [facility];  Service: Orthopedics;  Laterality: Left;  . TOTAL SHOULDER ARTHROPLASTY Right 06/27/2015   Procedure: REVERSE TOTAL SHOULDER ARTHROPLASTY;  Surgeon: Netta Cedars, MD;  Location: [place];  Service: Orthopedics;  Laterality: Right;  .  TRANSURETHRAL RESECTION OF PROSTATE  2006    There were no vitals filed for this visit.      Subjective Assessment - 02/19/16 1026    Currently in Pain? Yes   Pain Score 4    Pain Location Back   Aggravating Factors  working with trainer, bicycle   Pain Relieving Factors resting                          [facility] Adult PT Treatment/Exercise - 02/19/16 0001      Lumbar Exercises: Supine   Clam 20 reps   Heel Slides 10 reps   Bent Knee Raise 20 reps   Bridge 10 reps   Bridge Limitations over physioball x 20,   Straight Leg Raise 10 reps   Straight Leg Raises Limitations increases LBP so disc   Other Supine Lumbar Exercises physioball hamsting curls with core brace, roll ball R and L  x 20 each     Knee/Hip Exercises: Supine   Bridges Limitations 10 reps on ball     Knee/Hip Exercises: Sidelying   Hip ABduction Both;20 reps   Clams x 10x2 and x 10x2 reverse clam                  PT Short Term Goals - 02/16/16 1421      PT SHORT TERM GOAL #1   Title pt will be I with inital HEP (01/10/2016)   Time 4   Period Weeks   Status Achieved     PT SHORT TERM GOAL #2   Title pt will be able to verbalize and demonstrate proper posture and lifting and carrying techniques to prevent and reduce low back pain (01/10/2016)   Time 4   Period Weeks   Status Achieved     PT SHORT TERM GOAL #3   Title pt will improve bil hip abductor/ extensor strength to >/= 4-/5 with >/= 2/10 pain in the low back to promote functional stability (01/10/2016)   Baseline pain on right low back with testing   Time 4   Period Weeks   Status Partially Met     PT SHORT TERM GOAL #4   Title berg balance testing will be performed and goals well be made   Time 2   Period Weeks   Status Achieved     PT SHORT TERM GOAL #5   Title pt will improve trunk mobility by >/= 5 degrees in all planes with </= 2/10 pain and to promote functional progression (01/10/2016)   Time 4   Period Weeks   Status Achieved           PT Long Term Goals - 02/16/16 1431      PT LONG TERM GOAL #1   Title pt will be I with all HEP given as of last visit (02/05/2016)   Time 8   Period Weeks   Status On-going     PT LONG TERM GOAL #2   Title He will improve trunk flexion and side bending by >/= 10 degrees with </= 1/10 pain </= low guard with no report of feeling like he will fall to promote functional trunk mobility and safety during ADLS (09/21/4740)   Time 8   Period Weeks   Status Partially Met     PT LONG TERM GOAL #3   Title pt will improve bil hip strength to >/= 4/5 to for functional endurance with walking/ standing and promote safety (  02/05/2016)   Time 8   Period Weeks   Status On-going      PT LONG TERM GOAL #4   Title pt will be able to walk/ stand for >/= 45 minutes with LRAD with </= 1/10 pain to assist with functional endruance for ADLs and assist with pt's goal of returning to playing golf (02/05/2016)   Baseline 15 minutes max   Time 8   Period Weeks   Status On-going     PT LONG TERM GOAL #5   Title at discharge pt with improve his FOTO score to >/= 72 to demonstrate improvement in function (02/05/2016)   Time 8   Period Weeks   Status Unable to assess     PT LONG TERM GOAL #6   Title pt will improve his Berg balance score to >/= 45/56 to promote safety with walking/ standing and help with pt's goal with returning to playing golf (02/05/2016)   Time 8   Period Weeks   Status Achieved               Plan - 02/19/16 1303    Clinical Impression Statement Pt will see Dr Nelva Bush tomorrow regarding hip pain. We are focusing core and hip strength and balance. He is seeing a personal trainer 2 x per week for LE strength. He has more back pain today after his personal trainer session this morning. Hooklying core exercises increased LBP. Core exercises on physioball are more comfortable.    PT Next Visit Plan what did MD say Re: Hip///FOTO and GCode Soon/  continue Core/LE strengthening, SLS and tandem balance exercises ( HX of CX and PACEMAKER)       Patient will benefit from skilled therapeutic intervention in order to improve the following deficits and impairments:  Pain, Improper body mechanics, Postural dysfunction, Decreased balance, Decreased endurance, Decreased activity tolerance, Difficulty walking, Decreased range of motion, Abnormal gait, Hypomobility, Increased fascial restricitons, Decreased strength, Decreased mobility  Visit Diagnosis: Bilateral low back pain, with sciatica presence unspecified  Muscle weakness (generalized)  Cramp and spasm  Other abnormalities of gait and mobility  Unsteadiness on feet     Problem List Patient Active Problem  List   Diagnosis Date Noted  . S/P shoulder replacement 06/27/2015  . Residual cognitive deficit as late effect of stroke 04/15/2015  . Lung nodule   . Abnormal CXR 12/12/2014  . GI bleed 12/11/2014  . Weakness generalized 12/11/2014  . Leukocytosis 12/11/2014  . Restless leg syndrome 12/11/2014  . Anemia 12/11/2014  . Abnormal EKG 12/11/2014  . Gait disturbance, post-stroke 10/22/2014  . TIA (transient ischemic attack) 06/18/2014  . Partial seizures ([place]) 06/18/2014  . Seizure ([place]) 06/18/2014  . Vitamin B12 deficiency 06/18/2014  . OSA (obstructive sleep apnea) 02/26/2014  . Cognitive deficits as late effect of cerebrovascular disease 01/11/2014  . Fatigue 09/20/2013  . Flu-like symptoms 06/19/2013  . Intermittent lightheadedness 06/19/2013  . Low blood pressure, not hypotension 06/19/2013  . Influenza with respiratory manifestations 06/19/2013  . Embolic cerebral infarction ([place]) 04/09/2013  . CVA (cerebral infarction) 04/03/2013  . Occlusion and stenosis of carotid artery without mention of cerebral infarction 11/21/2012  . OA (osteoarthritis) of knee 12/20/2011  . Methotrexate, long term, current use 09/10/2011  . Knee pain, left 07/27/2011  . Pacemaker-Medtronic 09/08/2010  . UNSPECIFIED VISUAL DISTURBANCE 07/20/2010  . SYNCOPE 06/01/2010  . IRON DEFIC ANEMIA [place] DIET IRON INTAKE 04/20/2010  . PSORIASIS 04/20/2010  . HEADACHE, PERSISTENT 01/13/2010  . HEMATURIA UNSPECIFIED  11/19/2009  . PHLEBITIS AND THROMBOPHLEBITIS OF OTHER SITE 07/02/2009  . CELLULITIS, LEG, LEFT 07/02/2009  . DISUSE OSTEOPOROSIS 05/12/2009  . HYPERGLYCEMIA, BORDERLINE 01/10/2009  . SPONDYLOSIS, LUMBAR, WITH RADICULOPATHY 10/15/2008  . OTHER SPECIFIED DERMATOMYCOSES 06/24/2008  . EXTRINSIC ASTHMA, WITH EXACERBATION 05/01/2008  . CALCU GALLBLADD W/OTH CHOLECYSTITIS&OBSTRUCTION 02/20/2008  . VARICOSE VEINS LOWER EXTREMITIES W/INFLAMMATION 12/25/2007  . EDEMA 12/25/2007  . INSOMNIA WITH SLEEP  APNEA UNSPECIFIED 08/31/2007  . GERD 03/23/2007  . FIBRILLATION, ATRIAL 01/31/2007  . PSORIATIC ARTHRITIS 01/31/2007  . ABDOMINAL BLOATING 01/31/2007  . Hyperlipidemia 01/19/2007  . Essential hypertension 01/19/2007  . PROSTATE CANCER, HX OF 01/19/2007    Dorene Ar, PTA 02/19/2016, 1:13 PM  [facility] [address] [place], [place], [address] Phone: [phone]   Fax:  [phone]  Name: Christopher Baker MRN: [id number] Date of Birth: May 02, 1934

## 2016-02-20 DIAGNOSIS — M5136 Other intervertebral disc degeneration, lumbar region: Secondary | ICD-10-CM | POA: Diagnosis not present

## 2016-02-20 DIAGNOSIS — M1611 Unilateral primary osteoarthritis, right hip: Secondary | ICD-10-CM | POA: Diagnosis not present

## 2016-02-26 ENCOUNTER — Other Ambulatory Visit: Payer: Self-pay | Admitting: *Deleted

## 2016-02-26 ENCOUNTER — Ambulatory Visit: Payer: PPO | Admitting: Physical Therapy

## 2016-02-26 DIAGNOSIS — M1611 Unilateral primary osteoarthritis, right hip: Secondary | ICD-10-CM | POA: Diagnosis not present

## 2016-02-26 MED ORDER — APIXABAN 5 MG PO TABS: 5.0000 mg | ORAL_TABLET | Freq: Two times a day (BID) | ORAL | 9 refills | Status: DC

## 2016-03-01 ENCOUNTER — Ambulatory Visit: Payer: PPO | Attending: Physical Medicine and Rehabilitation | Admitting: Physical Therapy

## 2016-03-01 DIAGNOSIS — R2689 Other abnormalities of gait and mobility: Secondary | ICD-10-CM

## 2016-03-01 DIAGNOSIS — R2681 Unsteadiness on feet: Secondary | ICD-10-CM | POA: Diagnosis not present

## 2016-03-01 DIAGNOSIS — M545 Low back pain: Secondary | ICD-10-CM

## 2016-03-01 DIAGNOSIS — M6281 Muscle weakness (generalized): Secondary | ICD-10-CM | POA: Diagnosis not present

## 2016-03-01 DIAGNOSIS — R252 Cramp and spasm: Secondary | ICD-10-CM

## 2016-03-01 NOTE — Therapy (Signed)
[place], [place], [address] Phone: [phone]   Fax:  [phone]  Physical Therapy Treatment  Patient Details  Name: Christopher Baker MRN: [id number] Date of Birth: 03/05/1934 Referring Provider: Suella Broad Md.  Encounter Date: 03/01/2016      PT End of Session - 03/01/16 1414    Visit Number 19   Number of Visits 27   Date for PT Re-Evaluation 03/29/16   PT Start Time [id number]   PT Stop Time 1435   PT Time Calculation (min) 60 min   Activity Tolerance Patient tolerated treatment well   Behavior During Therapy WFL for tasks assessed/performed      Past Medical History:  Diagnosis Date  . Arthritis with psoriasis ([place])   . Borderline diabetes    DIET CONTROLLED - PT STATES HE IS NOT DIABETIC  . Cancer [facility])    prostate  2008  . Carotid stenosis, bilateral PER DR EARLY / DUPLEX  09-09-10     40 - 59%   BILATERALLY--- ASYMPTOMATIC  . Cataract immature LEFT EYE  . Dysrhythmia    a-fib  . GERD (gastroesophageal reflux disease)    CONTROLLED W/ PROTONIX  . History of prostate cancer S/P RADIATION  [place]  6 YRS AGO  . HTN (hypertension)   . Hyperlipidemia   . Iron deficiency   . Lumbar spondylosis W/ RADICULOPATHY  . PAF (paroxysmal atrial fibrillation) ([place])   . Restless leg syndrome   . Sleep apnea TESTED YRS AGO-- NO CPAP RX GIVEN   PT STATES HE DOES NOT THINK HE STILL HAS SLEEP APNEA--DOES NOT USE CPAP  . Status post placement of cardiac pacemaker DDD-  06-04-10-- LAST CHECK 09-08-10 IN EPIC   SECONDARY TO SYNCOPY AND BRADYCARDIA  . Stroke [facility]) Oct. 14, 2014   light left hand, balance issues, short term memory loss 2014  . SVT (supraventricular tachycardia) ([place])    S/P ABLATION   2008    Past Surgical History:  Procedure Laterality Date  . BACK SURGERY    . CARDIAC ELECTROPHYSIOLOGY STUDY AND ABLATION  2008   FOR SVT  . CARDIAC PACEMAKER PLACEMENT  06-04-10   DDD/ AND REMOVAL LOOR  RECORDER  . CHOLECYSTECTOMY  2009  . ESOPHAGOGASTRODUODENOSCOPY N/A 12/12/2014   Procedure: ESOPHAGOGASTRODUODENOSCOPY (EGD);  Surgeon: Wilford Corner, MD;  Location: [facility] ENDOSCOPY;  Service: Endoscopy;  Laterality: N/A;  . INGUINAL HERNIA REPAIR  2005   LEFT/   DONE WITH PENILE PROSTESIS SURG.  . JOINT REPLACEMENT    . KNEE ARTHROSCOPY  06/02/2011   Procedure: ARTHROSCOPY KNEE;  Surgeon: Gearlean Alf;  Location: [place];  Service: Orthopedics;  Laterality: Left;  LEFT KNEE ARTHROSCOPY WITH DEBRIDEMENT  . LOOP RECORDER PLACEMENT  01-30-10  . LUMBAR LAMINECTOMY/ DISKECTOMY/ FUSION  05-19-10   L4 - 5  . PENILE PROSTHESIS IMPLANT  1995  . PROSTATE BIOPSY  2007  . REMOVAL AND PLACEMENT PENILE PROSTHESIS IMPLANT   2005   AND LEFT CORPOROPLASTY /    DONE INGUINAL REPAIR  . REPLACEMENT TOTAL KNEE  1997   RIGHT  . SPINE SURGERY    . TOTAL KNEE ARTHROPLASTY  12/20/2011   Procedure: TOTAL KNEE ARTHROPLASTY;  Surgeon: Gearlean Alf, MD;  Location: [facility];  Service: Orthopedics;  Laterality: Left;  . TOTAL SHOULDER ARTHROPLASTY Right 06/27/2015   Procedure: REVERSE TOTAL SHOULDER ARTHROPLASTY;  Surgeon: Netta Cedars, MD;  Location: [place] [facility];  Service: Orthopedics;  Laterality: Right;  .  TRANSURETHRAL RESECTION OF PROSTATE  2006    There were no vitals filed for this visit.      Subjective Assessment - 03/01/16 1337    Subjective Has a shot Friday. (Anterior hip right)  Unable to exercise at the gym this morning.    Currently in Pain? Yes   Pain Score 2   shoots up to 8-9/10, lasts 2-4 minutes   Pain Location Hip  Back 3/10 at the most   Pain Orientation Anterior   Pain Descriptors / Indicators Cramping;Spasm   Aggravating Factors  ramdom spasms,  tried ice pack but that increased spasms.     Pain Relieving Factors rubbing it   Multiple Pain Sites No                         [facility] Adult PT Treatment/Exercise - 03/01/16 0001      Moist Heat Therapy    Number Minutes Moist Heat 10 Minutes   Moist Heat Location Hip  anterior     Manual Therapy   Manual Therapy Soft tissue mobilization  manual and instrument assist,  PROM Right hip all motions wi   Manual therapy comments manual trigger point release Anterior hip Rectus femoris, by Vania Rea PT                  PT Short Term Goals - 03/01/16 1820      PT SHORT TERM GOAL #1   Title pt will be I with inital HEP (01/10/2016)   Status Achieved     PT SHORT TERM GOAL #2   Title pt will be able to verbalize and demonstrate proper posture and lifting and carrying techniques to prevent and reduce low back pain (01/10/2016)   Status Achieved     PT SHORT TERM GOAL #3   Title pt will improve bil hip abductor/ extensor strength to >/= 4-/5 with >/= 2/10 pain in the low back to promote functional stability (01/10/2016)   Time 4   Period Weeks   Status Unable to assess     PT SHORT TERM GOAL #4   Title berg balance testing will be performed and goals well be made   Status Achieved     PT SHORT TERM GOAL #5   Title pt will improve trunk mobility by >/= 5 degrees in all planes with </= 2/10 pain and to promote functional progression (01/10/2016)   Status Achieved           PT Long Term Goals - 03/01/16 1821      PT LONG TERM GOAL #1   Title pt will be I with all HEP given as of last visit (02/05/2016)   Time 8   Period Weeks   Status On-going     PT LONG TERM GOAL #2   Title He will improve trunk flexion and side bending by >/= 10 degrees with </= 1/10 pain </= low guard with no report of feeling like he will fall to promote functional trunk mobility and safety during ADLS ([id number])   Time 8   Period Weeks   Status Unable to assess     PT LONG TERM GOAL #3   Title pt will improve bil hip strength to >/= 4/5 to for functional endurance with walking/ standing and promote safety ( 02/05/2016)   Time 8   Period Weeks   Status Unable to assess     PT LONG TERM GOAL #4    Title  pt will be able to walk/ stand for >/= 45 minutes with LRAD with </= 1/10 pain to assist with functional endruance for ADLs and assist with pt's goal of returning to playing golf (02/05/2016)   Time 8   Period Weeks   Status On-going     PT LONG TERM GOAL #5   Title at discharge pt with improve his FOTO score to >/= 72 to demonstrate improvement in function (02/05/2016)   Time 8   Period Weeks   Status Unable to assess     PT LONG TERM GOAL #6   Title pt will improve his Berg balance score to >/= 45/56 to promote safety with walking/ standing and help with pt's goal with returning to playing golf (02/05/2016)   Status Achieved               Plan - 03/01/16 1818    Clinical Impression Statement Manual most of session with gentle stretches and triggerpoint release.  Heat helpful.  Patient has been having increased spasms.  Fewer spasms with session   PT Next Visit Plan  manual as needed,   Re: Hip///FOTO and GCode Soon/  continue Core/LE strengthening, SLS and tandem balance exercises ( HX of CX and PACEMAKER)    PT Home Exercise Plan use heat,  No exercises that hurt for the rest of the day   Consulted and Agree with Plan of Care Patient      Patient will benefit from skilled therapeutic intervention in order to improve the following deficits and impairments:  Pain, Improper body mechanics, Postural dysfunction, Decreased balance, Decreased endurance, Decreased activity tolerance, Difficulty walking, Decreased range of motion, Abnormal gait, Hypomobility, Increased fascial restricitons, Decreased strength, Decreased mobility  Visit Diagnosis: Bilateral low back pain, with sciatica presence unspecified  Muscle weakness (generalized)  Cramp and spasm  Other abnormalities of gait and mobility  Unsteadiness on feet     Problem List Patient Active Problem List   Diagnosis Date Noted  . S/P shoulder replacement 06/27/2015  . Residual cognitive deficit as late effect of  stroke 04/15/2015  . Lung nodule   . Abnormal CXR 12/12/2014  . GI bleed 12/11/2014  . Weakness generalized 12/11/2014  . Leukocytosis 12/11/2014  . Restless leg syndrome 12/11/2014  . Anemia 12/11/2014  . Abnormal EKG 12/11/2014  . Gait disturbance, post-stroke 10/22/2014  . TIA (transient ischemic attack) 06/18/2014  . Partial seizures ([place]) 06/18/2014  . Seizure ([place]) 06/18/2014  . Vitamin B12 deficiency 06/18/2014  . OSA (obstructive sleep apnea) 02/26/2014  . Cognitive deficits as late effect of cerebrovascular disease 01/11/2014  . Fatigue 09/20/2013  . Flu-like symptoms 06/19/2013  . Intermittent lightheadedness 06/19/2013  . Low blood pressure, not hypotension 06/19/2013  . Influenza with respiratory manifestations 06/19/2013  . Embolic cerebral infarction ([place]) 04/09/2013  . CVA (cerebral infarction) 04/03/2013  . Occlusion and stenosis of carotid artery without mention of cerebral infarction 11/21/2012  . OA (osteoarthritis) of knee 12/20/2011  . Methotrexate, long term, current use 09/10/2011  . Knee pain, left 07/27/2011  . Pacemaker-Medtronic 09/08/2010  . UNSPECIFIED VISUAL DISTURBANCE 07/20/2010  . SYNCOPE 06/01/2010  . IRON DEFIC ANEMIA [place] DIET IRON INTAKE 04/20/2010  . PSORIASIS 04/20/2010  . HEADACHE, PERSISTENT 01/13/2010  . HEMATURIA UNSPECIFIED 11/19/2009  . PHLEBITIS AND THROMBOPHLEBITIS OF OTHER SITE 07/02/2009  . CELLULITIS, LEG, LEFT 07/02/2009  . DISUSE OSTEOPOROSIS 05/12/2009  . HYPERGLYCEMIA, BORDERLINE 01/10/2009  . SPONDYLOSIS, LUMBAR, WITH RADICULOPATHY 10/15/2008  . OTHER SPECIFIED DERMATOMYCOSES 06/24/2008  .  EXTRINSIC ASTHMA, WITH EXACERBATION 05/01/2008  . CALCU GALLBLADD W/OTH CHOLECYSTITIS&OBSTRUCTION 02/20/2008  . VARICOSE VEINS LOWER EXTREMITIES W/INFLAMMATION 12/25/2007  . EDEMA 12/25/2007  . INSOMNIA WITH SLEEP APNEA UNSPECIFIED 08/31/2007  . GERD 03/23/2007  . FIBRILLATION, ATRIAL 01/31/2007  . PSORIATIC ARTHRITIS 01/31/2007   . ABDOMINAL BLOATING 01/31/2007  . Hyperlipidemia 01/19/2007  . Essential hypertension 01/19/2007  . PROSTATE CANCER, HX OF 01/19/2007    [facility] 03/01/2016, 6:22 PM  [facility] [address] [place], [place], [address] Phone: [phone]   Fax:  [phone]  Name: JONHATAN GILKISON MRN: [id number] Date of Birth: 12-28-1933   Melvenia Needles, PTA 03/01/16 6:22 PM Phone: [phone] Fax: [phone]

## 2016-03-03 ENCOUNTER — Encounter: Payer: Self-pay | Admitting: Neurology

## 2016-03-03 ENCOUNTER — Ambulatory Visit: Payer: PPO | Admitting: Physical Therapy

## 2016-03-03 ENCOUNTER — Ambulatory Visit (INDEPENDENT_AMBULATORY_CARE_PROVIDER_SITE_OTHER): Payer: PPO | Admitting: Neurology

## 2016-03-03 VITALS — HR 77 | Ht 75.0 in | Wt 228.0 lb

## 2016-03-03 DIAGNOSIS — M545 Low back pain: Secondary | ICD-10-CM | POA: Diagnosis not present

## 2016-03-03 DIAGNOSIS — I679 Cerebrovascular disease, unspecified: Secondary | ICD-10-CM | POA: Diagnosis not present

## 2016-03-03 DIAGNOSIS — IMO0001 Reserved for inherently not codable concepts without codable children: Secondary | ICD-10-CM

## 2016-03-03 DIAGNOSIS — F32A Depression, unspecified: Secondary | ICD-10-CM

## 2016-03-03 DIAGNOSIS — M6281 Muscle weakness (generalized): Secondary | ICD-10-CM

## 2016-03-03 DIAGNOSIS — I480 Paroxysmal atrial fibrillation: Secondary | ICD-10-CM

## 2016-03-03 DIAGNOSIS — R2681 Unsteadiness on feet: Secondary | ICD-10-CM

## 2016-03-03 DIAGNOSIS — G4733 Obstructive sleep apnea (adult) (pediatric): Secondary | ICD-10-CM | POA: Diagnosis not present

## 2016-03-03 DIAGNOSIS — R2689 Other abnormalities of gait and mobility: Secondary | ICD-10-CM

## 2016-03-03 DIAGNOSIS — F329 Major depressive disorder, single episode, unspecified: Secondary | ICD-10-CM

## 2016-03-03 DIAGNOSIS — R252 Cramp and spasm: Secondary | ICD-10-CM

## 2016-03-03 DIAGNOSIS — G609 Hereditary and idiopathic neuropathy, unspecified: Secondary | ICD-10-CM

## 2016-03-03 DIAGNOSIS — R404 Transient alteration of awareness: Secondary | ICD-10-CM | POA: Diagnosis not present

## 2016-03-03 DIAGNOSIS — M1611 Unilateral primary osteoarthritis, right hip: Secondary | ICD-10-CM | POA: Diagnosis not present

## 2016-03-03 DIAGNOSIS — G3184 Mild cognitive impairment, so stated: Secondary | ICD-10-CM | POA: Diagnosis not present

## 2016-03-03 MED ORDER — SERTRALINE HCL 25 MG PO TABS: ORAL_TABLET | ORAL | 0 refills | Status: DC

## 2016-03-03 NOTE — Therapy (Addendum)
[place], [place], [address] Phone: [phone]   Fax:  [phone]  Physical Therapy Treatment  Patient Details  Name: Christopher Baker MRN: [id number] Date of Birth: Jun 18, 1934 Referring Provider: Suella Broad Md.  Encounter Date: 03/03/2016      PT End of Session - 03/03/16 1359    Visit Number 20   Number of Visits 27   Date for PT Re-Evaluation 03/29/16   Authorization Type [employer]: Kx mod by 15th visit; progress note/ G code by 20th visit   PT Start Time 0132   PT Stop Time 0215   PT Time Calculation (min) 43 min      Past Medical History:  Diagnosis Date  . Arthritis with psoriasis ([place])   . Borderline diabetes    DIET CONTROLLED - PT STATES HE IS NOT DIABETIC  . Cancer [facility])    prostate  2008  . Carotid stenosis, bilateral PER DR EARLY / DUPLEX  09-09-10     40 - 59%   BILATERALLY--- ASYMPTOMATIC  . Cataract immature LEFT EYE  . Dysrhythmia    a-fib  . GERD (gastroesophageal reflux disease)    CONTROLLED W/ PROTONIX  . History of prostate cancer S/P RADIATION  [place]  6 YRS AGO  . HTN (hypertension)   . Hyperlipidemia   . Iron deficiency   . Lumbar spondylosis W/ RADICULOPATHY  . PAF (paroxysmal atrial fibrillation) ([place])   . Restless leg syndrome   . Sleep apnea TESTED YRS AGO-- NO CPAP RX GIVEN   PT STATES HE DOES NOT THINK HE STILL HAS SLEEP APNEA--DOES NOT USE CPAP  . Status post placement of cardiac pacemaker DDD-  06-04-10-- LAST CHECK 09-08-10 IN EPIC   SECONDARY TO SYNCOPY AND BRADYCARDIA  . Stroke [facility]) Oct. 14, 2014   light left hand, balance issues, short term memory loss 2014  . SVT (supraventricular tachycardia) ([place])    S/P ABLATION   2008    Past Surgical History:  Procedure Laterality Date  . BACK SURGERY    . CARDIAC ELECTROPHYSIOLOGY STUDY AND ABLATION  2008   FOR SVT  . CARDIAC PACEMAKER PLACEMENT  06-04-10   DDD/ AND REMOVAL LOOR RECORDER  .  CHOLECYSTECTOMY  2009  . ESOPHAGOGASTRODUODENOSCOPY N/A 12/12/2014   Procedure: ESOPHAGOGASTRODUODENOSCOPY (EGD);  Surgeon: Wilford Corner, MD;  Location: [facility] ENDOSCOPY;  Service: Endoscopy;  Laterality: N/A;  . INGUINAL HERNIA REPAIR  2005   LEFT/   DONE WITH PENILE PROSTESIS SURG.  . JOINT REPLACEMENT    . KNEE ARTHROSCOPY  06/02/2011   Procedure: ARTHROSCOPY KNEE;  Surgeon: Gearlean Alf;  Location: [place];  Service: Orthopedics;  Laterality: Left;  LEFT KNEE ARTHROSCOPY WITH DEBRIDEMENT  . LOOP RECORDER PLACEMENT  01-30-10  . LUMBAR LAMINECTOMY/ DISKECTOMY/ FUSION  05-19-10   L4 - 5  . PENILE PROSTHESIS IMPLANT  1995  . PROSTATE BIOPSY  2007  . REMOVAL AND PLACEMENT PENILE PROSTHESIS IMPLANT   2005   AND LEFT CORPOROPLASTY /    DONE INGUINAL REPAIR  . REPLACEMENT TOTAL KNEE  1997   RIGHT  . SPINE SURGERY    . TOTAL KNEE ARTHROPLASTY  12/20/2011   Procedure: TOTAL KNEE ARTHROPLASTY;  Surgeon: Gearlean Alf, MD;  [facility];  Service: Orthopedics;  Laterality: Left;  . TOTAL SHOULDER ARTHROPLASTY Right 06/27/2015   Procedure: REVERSE TOTAL SHOULDER ARTHROPLASTY;  Surgeon: Netta Cedars, MD;  Location: [place];  Service: Orthopedics;  Laterality: Right;  .  TRANSURETHRAL RESECTION OF PROSTATE  2006    There were no vitals filed for this visit.      Subjective Assessment - 03/03/16 1357    Subjective I still have back pain but it's much less.    Currently in Pain? Yes   Pain Score 7    Pain Location Hip   Pain Orientation Right;Anterior   Pain Descriptors / Indicators Throbbing;Sharp   Aggravating Factors  hip flexion   Pain Relieving Factors massage, heat             [facility] PT Assessment - 03/03/16 0001      Observation/Other Assessments   Focus on Therapeutic Outcomes (FOTO)  65% limited, 33% limited on eval     AROM   Lumbar Flexion 70   Lumbar - Right Side Bend 35   Lumbar - Left Side Bend 35     Strength   Right Hip Flexion 2+/5    Right Hip Extension 4/5   Right Hip ABduction 4-/5   Left Hip Extension 4-/5   Left Hip ABduction 4/5     Berg Balance Test   Sit to Stand Able to stand without using hands and stabilize independently   Standing Unsupported Able to stand safely 2 minutes   Sitting with Back Unsupported but Feet Supported on Floor or Stool Able to sit safely and securely 2 minutes   Stand to Sit Sits safely with minimal use of hands   Transfers Able to transfer safely, minor use of hands   Standing Unsupported with Eyes Closed Able to stand 10 seconds safely   Standing Ubsupported with Feet Together Able to place feet together independently and stand 1 minute safely   From Standing, Reach Forward with Outstretched Arm Can reach forward >12 cm safely (5")   From Standing Position, Pick up Object from Floor Able to pick up shoe safely and easily   From Standing Position, Turn to Look Behind Over each Shoulder Looks behind one side only/other side shows less weight shift   Turn 360 Degrees Able to turn 360 degrees safely but slowly   Standing Unsupported, Alternately Place Feet on Step/Stool Able to stand independently and complete 8 steps >20 seconds  right hip pain limiting.    Standing Unsupported, One Foot in Front Able to plae foot ahead of the other independently and hold 30 seconds   Standing on One Leg Tries to lift leg/unable to hold 3 seconds but remains standing independently   Total Score 47                     [facility] Adult PT Treatment/Exercise - 03/03/16 0001      Knee/Hip Exercises: Standing   SLS with Vectors 3 way hip bilateral     Knee/Hip Exercises: Supine   Bridges Limitations 10 x 2                  PT Short Term Goals - 03/03/16 1417      PT SHORT TERM GOAL #1   Title pt will be I with inital HEP (01/10/2016)   Time 4   Period Weeks   Status Achieved     PT SHORT TERM GOAL #2   Title pt will be able to verbalize and demonstrate proper posture and  lifting and carrying techniques to prevent and reduce low back pain (01/10/2016)   Time 4   Period Weeks   Status Achieved     PT SHORT TERM GOAL #  3   Title pt will improve bil hip abductor/ extensor strength to >/= 4-/5 with >/= 2/10 pain in the low back to promote functional stability (01/10/2016)   Time 4   Period Weeks   Status Achieved     PT SHORT TERM GOAL #5   Title pt will improve trunk mobility by >/= 5 degrees in all planes with </= 2/10 pain and to promote functional progression (01/10/2016)   Time 4   Period Weeks   Status Achieved           PT Long Term Goals - 03/03/16 1400      PT LONG TERM GOAL #1   Title pt will be I with all HEP given as of last visit (02/05/2016)   Time 8   Period Weeks   Status On-going     PT LONG TERM GOAL #2   Title He will improve trunk flexion and side bending by >/= 10 degrees with </= 1/10 pain </= low guard with no report of feeling like he will fall to promote functional trunk mobility and safety during ADLS (4/85/4627)   Time 8   Period Weeks   Status Achieved     PT LONG TERM GOAL #3   Title pt will improve bil hip strength to >/= 4/5 to for functional endurance with walking/ standing and promote safety ( 02/05/2016)   Time 8   Period Weeks   Status Partially Met     PT LONG TERM GOAL #4   Title pt will be able to walk/ stand for >/= 45 minutes with LRAD with </= 1/10 pain to assist with functional endruance for ADLs and assist with pt's goal of returning to playing golf (02/05/2016)   Baseline 15 minutes max- limited by hip pain now.    Time 8   Period Weeks   Status On-going     PT LONG TERM GOAL #5   Title at discharge pt with improve his FOTO score to >/= 72 to demonstrate improvement in function (02/05/2016)   Time 8   Period Weeks   Status On-going     PT LONG TERM GOAL #6   Title pt will improve his Berg balance score to >/= 45/56 to promote safety with walking/ standing and help with pt's goal with returning to  playing golf (02/05/2016)   Baseline inital sore 30/56,  48/56 Best , NOW 47/56   Status Achieved               Plan - 03/03/16 1418    Clinical Impression Statement Mr. Laban reports his low back pain is intermittent and much better overall. He c/o anterior right hip pain that has progressively worsened. His hip extension and abduction strength have improved bilateral however his right hip flexion is 2+/5. He  received an injection recently which did not improve symptoms he feels may have worsened his pain. He will see Dr Nelva Bush today for follow up. His BERG balance score improved from 37/56  to 47/56. LTG# 3 Partially Met.    PT Next Visit Plan  manual as needed,   Re: Hip///FOTO and GCode Soon/  continue Core/LE strengthening, SLS and tandem balance exercises ( HX of CX and PACEMAKER) ; continue to assess Hip pain. see what MD said. PT INTERESTED IN GROUP AFTER PT [place]      Patient will benefit from skilled therapeutic intervention in order to improve the following deficits and impairments:  Pain, Improper body mechanics, Postural dysfunction, Decreased  balance, Decreased endurance, Decreased activity tolerance, Difficulty walking, Decreased range of motion, Abnormal gait, Hypomobility, Increased fascial restricitons, Decreased strength, Decreased mobility  Visit Diagnosis: Bilateral low back pain, with sciatica presence unspecified  Muscle weakness (generalized)  Cramp and spasm  Other abnormalities of gait and mobility  Unsteadiness on feet     Problem List Patient Active Problem List   Diagnosis Date Noted  . S/P shoulder replacement 06/27/2015  . Residual cognitive deficit as late effect of stroke 04/15/2015  . Lung nodule   . Abnormal CXR 12/12/2014  . GI bleed 12/11/2014  . Weakness generalized 12/11/2014  . Leukocytosis 12/11/2014  . Restless leg syndrome 12/11/2014  . Anemia 12/11/2014  . Abnormal EKG 12/11/2014  . Gait disturbance, post-stroke  10/22/2014  . TIA (transient ischemic attack) 06/18/2014  . Partial seizures ([place]) 06/18/2014  . Seizure ([place]) 06/18/2014  . Vitamin B12 deficiency 06/18/2014  . OSA (obstructive sleep apnea) 02/26/2014  . Cognitive deficits as late effect of cerebrovascular disease 01/11/2014  . Fatigue 09/20/2013  . Flu-like symptoms 06/19/2013  . Intermittent lightheadedness 06/19/2013  . Low blood pressure, not hypotension 06/19/2013  . Influenza with respiratory manifestations 06/19/2013  . Embolic cerebral infarction ([place]) 04/09/2013  . CVA (cerebral infarction) 04/03/2013  . Occlusion and stenosis of carotid artery without mention of cerebral infarction 11/21/2012  . OA (osteoarthritis) of knee 12/20/2011  . Methotrexate, long term, current use 09/10/2011  . Knee pain, left 07/27/2011  . Pacemaker-Medtronic 09/08/2010  . UNSPECIFIED VISUAL DISTURBANCE 07/20/2010  . SYNCOPE 06/01/2010  . IRON DEFIC ANEMIA [place] DIET IRON INTAKE 04/20/2010  . PSORIASIS 04/20/2010  . HEADACHE, PERSISTENT 01/13/2010  . HEMATURIA UNSPECIFIED 11/19/2009  . PHLEBITIS AND THROMBOPHLEBITIS OF OTHER SITE 07/02/2009  . CELLULITIS, LEG, LEFT 07/02/2009  . DISUSE OSTEOPOROSIS 05/12/2009  . HYPERGLYCEMIA, BORDERLINE 01/10/2009  . SPONDYLOSIS, LUMBAR, WITH RADICULOPATHY 10/15/2008  . OTHER SPECIFIED DERMATOMYCOSES 06/24/2008  . EXTRINSIC ASTHMA, WITH EXACERBATION 05/01/2008  . CALCU GALLBLADD W/OTH CHOLECYSTITIS&OBSTRUCTION 02/20/2008  . VARICOSE VEINS LOWER EXTREMITIES W/INFLAMMATION 12/25/2007  . EDEMA 12/25/2007  . INSOMNIA WITH SLEEP APNEA UNSPECIFIED 08/31/2007  . GERD 03/23/2007  . FIBRILLATION, ATRIAL 01/31/2007  . PSORIATIC ARTHRITIS 01/31/2007  . ABDOMINAL BLOATING 01/31/2007  . Hyperlipidemia 01/19/2007  . Essential hypertension 01/19/2007  . PROSTATE CANCER, HX OF 01/19/2007    Dorene Ar, PTA 03/03/2016, 2:39 PM  [facility] [address] [place], [place], [address] Phone: [phone]   Fax:  [phone]  Name: Christopher Baker MRN: [id number] Date of Birth: 03/23/1934

## 2016-03-03 NOTE — Patient Instructions (Signed)
1.  Decrease citalopram to 1 tablet daily for 2 weeks, then stop 2.  Once you stopped the citalopram, start sertraline 25mg .  Take 1 tablet daily for 7 days, then increase to 2 tablets daily. 3.  We will get a routine EEG to evaluate for increased risk for seizures 4.  We will send you for formal neuropsychological testing to assess memory better.  Follow up afterwards.

## 2016-03-03 NOTE — Progress Notes (Signed)
NEUROLOGY FOLLOW UP OFFICE NOTE  Christopher Baker DS:[id number]  HISTORY OF PRESENT ILLNESS: Christopher Baker is an 82 year old right-handed man with hypertension, hyperlipidemia, paroxysmal atrial fibrillation, PPM implant for synocpe, and history of prostate cancer status post radiation in 2007, who I see for vascular-related mild cognitive impairment, cardioembolic stroke and tremor, now presents for weakness in the legs.  UPDATE: He was doing well for a year after his stroke, but he has steadily declined over the past year.  He has history of low back pain status post lumbar fusion.  Over the past year, he has had increased balance problems with low back pain with pain radiating down the right leg.  His legs feel weak.  Pain is worse when standing and walking.  He also notes right groin pain.  He was evaluated by his neurosurgeon and reportedly has arthritis but nothing surgical to be done.  He is followed by orthopedist and pain specialist.  He has had epidural injections as well as injection in the right hip, which has only exacerbated pain.  He is undergoing PT, which is helpful.  As a result, he no longer is able to enjoy activities such as golf.  He is depressed about limitations such as urinary incontinence which hinders them from going out.  As a result, he has been feeling more depressed.  He does take citalopram 40mg  daily.  He has increased word-finding difficulties.  His wife also reports spells where he seems like he is in a trance.  He will briefly just stare.  He is responsive when spoken to and it only lasts a couple of seconds, but it occurs daily.  HISTORY: I.  Cardio-embolic stroke He was admitted to the hospital on 04/03/13 with left hand and arm weakness and facial droop.  CT head revealed right basal ganglia and parietal lobe infarcts.  He was found to have atrial fibrillation.  He was started on Xarelto.  He was admitted to [facility] on 06/18/14 for confusion.  Episode lasted  about 1 hour.  He was at dinner with his wife and had difficulty ordering from the menu.  His wife noted a left facial droop.  He did not have any slurred speech, focal numbness or weakness of the extremities.  His blood pressure was found to be in the 190s/80s.  CT of head revealed no abnormality.  Repeated CT of the head the following day showed no acute stroke.  Echo showed mildly decreased EF of 45-50%.  Carotid doppler showed 40% bilateral ICA stenosis.  An EEG was performed, which was negative.  Differential diagnosis included TIA verse seizure and he was started on Keppra 250mg  twice daily.  He was also restarted on lisinopril, which had been discontinued earlier in the month.  Since discharge, he exhibited lethargy and irritability.  Keppra was subsequently discontinued as a TIA was suspected rather than partial seizure.  II. Vascular cognitive impairment Over the last 2.5 years, he has had problems with memory.  He was repeating things and exhibited mental fogginess and lack of motivation.  He was experiencing depression and insomnia as well.  Procedural tasks and executive functioning were intact.  He was found to have a B12 level of 52 and was started on B12 shots.  He and his wife had noticed improvement in his memory.  However, insomnia and depression were still both an issue.  He had been under a lot of stress.  He was started on Celexa for depression and  Proson for insomnia.  Last year, he exhibited extreme lethargy, to the point where he was sleeping during the day, which he never previously done.  Sometimes, he was in such a deep sleep, it required repeated yelling and shaking to wake him up.  He seemed more "foggy" again.  One time, he couldn't figure out where to put the ice cream in the refrigerator.  When his wife told him to put it in the freezer, he was placing it in the vegetable drawer.  He decided to stop the Proson, and he and his wife noticed significant improvement in lethargy.  He  did note some improvement in memory since starting B12 supplementation.  Memory seemed worse after the stroke, so cognitive deficits were thought to be also related to stroke.  He had a formal driving evaluation in July 2015.  He was told to drive with someone else in the car for a couple of months so he can regain confidence.  The only restriction was that he should not drive at night unless it is a well-lit road.  He has since started driving alone sometimes, but only locally and during daylight hours.    He had formal neuropsych testing performed by Dr. Valentina Shaggy in January 2016.  Profile was consistent with outcome of a right hemispheric stroke: deficits in visual processing speed, non-dominant hand fine motor coordination/speed, and visual spatial dysfunction.  III Tremor He notes mild tremor in his hands, noticeable when he holds a utensil or writes.  He first noticed this about a month or so ago.  He has some balance issues due to bilateral knee replacement surgeries.  No family history of dementia or tremor (possibly his mother had tremor).  He takes ropinirole for RLS  PAST MEDICAL HISTORY: Past Medical History:  Diagnosis Date  . Arthritis with psoriasis ([place])   . Borderline diabetes    DIET CONTROLLED - PT STATES HE IS NOT DIABETIC  . Cancer [facility])    prostate  2008  . Carotid stenosis, bilateral PER DR EARLY / DUPLEX  09-09-10     40 - 59%   BILATERALLY--- ASYMPTOMATIC  . Cataract immature LEFT EYE  . Dysrhythmia    a-fib  . GERD (gastroesophageal reflux disease)    CONTROLLED W/ PROTONIX  . History of prostate cancer S/P RADIATION  TX  6 YRS AGO  . HTN (hypertension)   . Hyperlipidemia   . Iron deficiency   . Lumbar spondylosis W/ RADICULOPATHY  . PAF (paroxysmal atrial fibrillation) ([place])   . Restless leg syndrome   . Sleep apnea TESTED YRS AGO-- NO CPAP RX GIVEN   PT STATES HE DOES NOT THINK HE STILL HAS SLEEP APNEA--DOES NOT USE CPAP  . Status post placement of cardiac  pacemaker DDD-  06-04-10-- LAST CHECK 09-08-10 IN EPIC   SECONDARY TO SYNCOPY AND BRADYCARDIA  . Stroke [facility]) Oct. 14, 2014   light left hand, balance issues, short term memory loss 2014  . SVT (supraventricular tachycardia) ([place])    S/P ABLATION   2008    MEDICATIONS: Current Outpatient Prescriptions on File Prior to Visit  Medication Sig Dispense Refill  . apixaban (ELIQUIS) 5 MG TABS tablet Take 1 tablet (5 mg total) by mouth 2 (two) times daily. 60 tablet 9  . atorvastatin (LIPITOR) 20 MG tablet Take 20 mg by mouth daily.    . clotrimazole-betamethasone (LOTRISONE) cream Apply 1 application topically 2 (two) times daily as needed (psoriasis).    Marland Kitchen docusate sodium (COLACE)  100 MG capsule Take 100 mg by mouth as needed. Reported on 12/11/2015    . ezetimibe (ZETIA) 10 MG tablet Take 1 tablet (10 mg total) by mouth daily. 90 tablet 1  . folic acid (FOLVITE) 1 MG tablet Take 1 mg by mouth daily.    . furosemide (LASIX) 20 MG tablet Take 2 tablets (40 mg total) by mouth daily. 60 tablet 9  . lisinopril (PRINIVIL,ZESTRIL) 5 MG tablet Take 1 tablet (5 mg total) by mouth daily. 90 tablet 2  . methotrexate (RHEUMATREX) 2.5 MG tablet Take 4 tablets (10 mg total) by mouth once a week. Caution:Chemotherapy. Protect from light. Every monday 4 tablet 1  . mirabegron ER (MYRBETRIQ) 50 MG TB24 tablet Take 1 tablet (50 mg total) by mouth daily. 90 tablet 3  . pantoprazole (PROTONIX) 40 MG tablet Take 1 tablet (40 mg total) by mouth daily. 90 tablet 3  . predniSONE (DELTASONE) 5 MG tablet Take 1.5 tablets (7.5 mg total) by mouth daily. 135 tablet 0  . triamcinolone cream (KENALOG) 0.1 % Apply to affected area as directed (Patient taking differently: Apply 1 application topically daily as needed (rash). Apply to affected area as directed) 480 g 0   No current facility-administered medications on file prior to visit.     ALLERGIES: Allergies  Allergen Reactions  . Ciprofloxacin Nausea Only     REACTION: GI upset  . Hydrocodone-Acetaminophen     Hallucinations in higher doses  . Other Other (See Comments)    SCALLOPS--SUDDEN GI ATTACK    FAMILY HISTORY: Family History  Problem Relation Age of Onset  . Stroke Mother   . Early death Father     SOCIAL HISTORY: Social History   Social History  . Marital status: Married    Spouse name: N/A  . Number of children: N/A  . Years of education: N/A   Occupational History  . retired    Social History Main Topics  . Smoking status: Former Smoker    Packs/day: 0.10    Years: 55.00    Types: Cigarettes    Quit date: 12/07/1998  . Smokeless tobacco: Never Used  . Alcohol use 3.0 oz/week    5 Shots of liquor per week     Comment: gin and tonic  . Drug use: No  . Sexual activity: Yes    Partners: Female   Other Topics Concern  . Not on file   Social History Narrative  . No narrative on file    REVIEW OF SYSTEMS: Constitutional: No fevers, chills, or sweats, no generalized fatigue, change in appetite Eyes: No visual changes, double vision, eye pain Ear, nose and throat: No hearing loss, ear pain, nasal congestion, sore throat Cardiovascular: No chest pain, palpitations Respiratory:  No shortness of breath at rest or with exertion, wheezes GastrointestinaI: No nausea, vomiting, diarrhea, abdominal pain, fecal incontinence Genitourinary:  urinaryfrequency Musculoskeletal:  No neck pain, back pain Integumentary: No rash, pruritus, skin lesions Neurological: as above Psychiatric: depression Endocrine: No palpitations, fatigue, diaphoresis, mood swings, change in appetite, change in weight, increased thirst Hematologic/Lymphatic:  No purpura, petechiae. Allergic/Immunologic: no itchy/runny eyes, nasal congestion, recent allergic reactions, rashes  PHYSICAL EXAM: Vitals:   03/03/16 0914  Pulse: 77   General: No acute distress.  Patient appears well-groomed. . Head:  Normocephalic/atraumatic Eyes:  Fundi examined  but not visualized Neck: supple, no paraspinal tenderness, full range of motion Heart:  Regular rate and rhythm.  Lungs:  Clear to auscultation bilaterally Back: No paraspinal  tenderness Neurological Exam: alert and oriented to person, place, and time (except year). Attention span and concentration intact, delayed recall poor, remote memory intact, fund of knowledge intact.  Speech fluent and not dysarthric, language intact.    Montreal Cognitive Assessment  03/03/2016 04/15/2015 07/09/2014  Visuospatial/ Executive (0/5) 4 4 4   Naming (0/3) 3 3 3   Attention: Read list of digits (0/2) 2 2 2   Attention: Read list of letters (0/1) 1 1 1   Attention: Serial 7 subtraction starting at 100 (0/3) 3 3 3   Language: Repeat phrase (0/2) 2 2 2   Language : Fluency (0/1) 0 1 1  Abstraction (0/2) 2 2 2   Delayed Recall (0/5) 0 4 0  Orientation (0/6) 5 5 6   Total 22 27 24   Adjusted Score (based on education) 22 27 24   . CN II-XII intact. Bulk and tone normal, muscle strength 5/5 throughout.  Reduced finger-thumb tapping speed bilaterally.  Finger to nose testing intact with fine postural and kinetic tremor.  Reduced pinprick and vibration sensation in feet and hands.  Deep tendon reflexes trace throughout except absent in ankles and right patellars.  Gait normal stance and stride.    IMPRESSION: 1.  Bilateral leg weakness and gait instability, likely related to lumbar spine disease, bilateral knee replacement and underlying peripheral neuropathy. 2.  Cerebrovascular disease with recurrent TIA, hyperlipidemia 3.  Mild cognitive impairment, vascular related.  Reports worsening memory.  May be related to depression 4.  Staring spells.  May be behavioral, related to depression, but seizure should be evaluated.  PLAN: 1.  We will taper off of citalopram and then start sertraline 50mg  daily and see if he responds better. 2.  I would like to set him up for neuropsychological testing in order to sort out likely  cause of memory (depression-related, vascular or neurodegenerative), which may alter management. 3.  We will get a routine EEG 4.  Will get notes/imaging reports of lumbar spine from neurosurgery and orthopedist 5.  He is on anticoagulation for secondary stroke prevention.  He is on statin therapy as managed by PCP.  LDL goal should be less than 70.  Maintain blood pressure control. 6.  Back and hip pain treated as per orthopedist/pain specialist 7.  Follow up after testing.  27 minutes spent face to face with patient, over 50% spent counseling.  Metta Clines, DO  CC:  Domenick Gong, MD

## 2016-03-03 NOTE — Progress Notes (Signed)
Chart forwarded.  

## 2016-03-05 ENCOUNTER — Other Ambulatory Visit: Payer: Self-pay | Admitting: Physical Medicine and Rehabilitation

## 2016-03-05 DIAGNOSIS — I6990 Unspecified sequelae of unspecified cerebrovascular disease: Secondary | ICD-10-CM | POA: Diagnosis not present

## 2016-03-05 DIAGNOSIS — I480 Paroxysmal atrial fibrillation: Secondary | ICD-10-CM | POA: Diagnosis not present

## 2016-03-05 DIAGNOSIS — G4733 Obstructive sleep apnea (adult) (pediatric): Secondary | ICD-10-CM | POA: Diagnosis not present

## 2016-03-05 DIAGNOSIS — R918 Other nonspecific abnormal finding of lung field: Secondary | ICD-10-CM | POA: Diagnosis not present

## 2016-03-05 DIAGNOSIS — Z6829 Body mass index (BMI) 29.0-29.9, adult: Secondary | ICD-10-CM | POA: Diagnosis not present

## 2016-03-05 DIAGNOSIS — E440 Moderate protein-calorie malnutrition: Secondary | ICD-10-CM | POA: Diagnosis not present

## 2016-03-05 DIAGNOSIS — E7800 Pure hypercholesterolemia, unspecified: Secondary | ICD-10-CM | POA: Diagnosis not present

## 2016-03-05 DIAGNOSIS — M1611 Unilateral primary osteoarthritis, right hip: Secondary | ICD-10-CM

## 2016-03-05 DIAGNOSIS — F321 Major depressive disorder, single episode, moderate: Secondary | ICD-10-CM | POA: Diagnosis not present

## 2016-03-05 DIAGNOSIS — I10 Essential (primary) hypertension: Secondary | ICD-10-CM | POA: Diagnosis not present

## 2016-03-05 DIAGNOSIS — D692 Other nonthrombocytopenic purpura: Secondary | ICD-10-CM | POA: Diagnosis not present

## 2016-03-05 DIAGNOSIS — D509 Iron deficiency anemia, unspecified: Secondary | ICD-10-CM | POA: Diagnosis not present

## 2016-03-05 DIAGNOSIS — N3281 Overactive bladder: Secondary | ICD-10-CM | POA: Diagnosis not present

## 2016-03-08 ENCOUNTER — Ambulatory Visit: Payer: PPO | Admitting: Physical Therapy

## 2016-03-08 ENCOUNTER — Ambulatory Visit
Admission: RE | Admit: 2016-03-08 | Discharge: 2016-03-08 | Disposition: A | Discharge status: Home / Self Care | Payer: PPO | Source: Ambulatory Visit | Attending: Physical Medicine and Rehabilitation | Admitting: Physical Medicine and Rehabilitation

## 2016-03-08 DIAGNOSIS — M25551 Pain in right hip: Secondary | ICD-10-CM | POA: Diagnosis not present

## 2016-03-08 DIAGNOSIS — M545 Low back pain: Secondary | ICD-10-CM

## 2016-03-08 DIAGNOSIS — M1611 Unilateral primary osteoarthritis, right hip: Secondary | ICD-10-CM

## 2016-03-08 DIAGNOSIS — R2681 Unsteadiness on feet: Secondary | ICD-10-CM

## 2016-03-08 DIAGNOSIS — M6281 Muscle weakness (generalized): Secondary | ICD-10-CM

## 2016-03-08 DIAGNOSIS — R2689 Other abnormalities of gait and mobility: Secondary | ICD-10-CM

## 2016-03-08 DIAGNOSIS — R252 Cramp and spasm: Secondary | ICD-10-CM

## 2016-03-08 NOTE — Therapy (Signed)
[place], [place], [address] Phone: [phone]   Fax:  [phone]  Physical Therapy Treatment  Patient Details  Name: Christopher Baker MRN: [id number] Date of Birth: [birth year] Referring Provider: Suella Broad Md.  Encounter Date: 03/08/2016      PT End of Session - 03/08/16 1436    Visit Number 21   Number of Visits 27   Date for PT Re-Evaluation 03/29/16   PT Start Time 1330   PT Stop Time 1415   PT Time Calculation (min) 45 min   Activity Tolerance Patient tolerated treatment well   Behavior During Therapy [facility] for tasks assessed/performed      Past Medical History:  Diagnosis Date  . Arthritis with psoriasis ([place])   . Borderline diabetes    DIET CONTROLLED - PT STATES HE IS NOT DIABETIC  . Cancer [facility])    prostate  2008  . Carotid stenosis, bilateral PER DR EARLY / DUPLEX  09-09-10     40 - 59%   BILATERALLY--- ASYMPTOMATIC  . Cataract immature LEFT EYE  . Dysrhythmia    a-fib  . GERD (gastroesophageal reflux disease)    CONTROLLED W/ PROTONIX  . History of prostate cancer S/P RADIATION  [place]  6 YRS AGO  . HTN (hypertension)   . Hyperlipidemia   . Iron deficiency   . Lumbar spondylosis W/ RADICULOPATHY  . PAF (paroxysmal atrial fibrillation) ([place])   . Restless leg syndrome   . Sleep apnea TESTED YRS AGO-- NO CPAP RX GIVEN   PT STATES HE DOES NOT THINK HE STILL HAS SLEEP APNEA--DOES NOT USE CPAP  . Status post placement of cardiac pacemaker DDD-  06-04-10-- LAST CHECK 09-08-10 IN EPIC   SECONDARY TO SYNCOPY AND BRADYCARDIA  . Stroke [facility]) Oct. 14, 2014   light left hand, balance issues, short term memory loss 2014  . SVT (supraventricular tachycardia) ([place])    S/P ABLATION   2008    Past Surgical History:  Procedure Laterality Date  . BACK SURGERY    . CARDIAC ELECTROPHYSIOLOGY STUDY AND ABLATION  2008   FOR SVT  . CARDIAC PACEMAKER PLACEMENT  06-04-10   DDD/ AND REMOVAL LOOR  RECORDER  . CHOLECYSTECTOMY  2009  . ESOPHAGOGASTRODUODENOSCOPY N/A 12/12/2014   Procedure: ESOPHAGOGASTRODUODENOSCOPY (EGD);  Surgeon: Wilford Corner, MD;  [facility] ENDOSCOPY;  Service: Endoscopy;  Laterality: N/A;  . INGUINAL HERNIA REPAIR  2005   LEFT/   DONE WITH PENILE PROSTESIS SURG.  . JOINT REPLACEMENT    . KNEE ARTHROSCOPY  06/02/2011   Procedure: ARTHROSCOPY KNEE;  Surgeon: Gearlean Alf;  Location: [place];  Service: Orthopedics;  Laterality: Left;  LEFT KNEE ARTHROSCOPY WITH DEBRIDEMENT  . LOOP RECORDER PLACEMENT  01-30-10  . LUMBAR LAMINECTOMY/ DISKECTOMY/ FUSION  05-19-10   L4 - 5  . PENILE PROSTHESIS IMPLANT  1995  . PROSTATE BIOPSY  2007  . REMOVAL AND PLACEMENT PENILE PROSTHESIS IMPLANT   2005   AND LEFT CORPOROPLASTY /    DONE INGUINAL REPAIR  . REPLACEMENT TOTAL KNEE  1997   RIGHT  . SPINE SURGERY    . TOTAL KNEE ARTHROPLASTY  12/20/2011   Procedure: TOTAL KNEE ARTHROPLASTY;  Surgeon: Gearlean Alf, MD;  Location: [facility] ORS;  Service: Orthopedics;  Laterality: Left;  . TOTAL SHOULDER ARTHROPLASTY Right 06/27/2015   Procedure: REVERSE TOTAL SHOULDER ARTHROPLASTY;  Surgeon: Netta Cedars, MD;  Location: [place];  Service: Orthopedics;  Laterality: Right;  .  TRANSURETHRAL RESECTION OF PROSTATE  2006    There were no vitals filed for this visit.      Subjective Assessment - 03/08/16 1338    Subjective Had a CT scan this morning.  The MD will get the results tomorrow.    Pain Score 5    Pain Location Hip   Pain Orientation Right;Anterior   Pain Descriptors / Indicators Sharp   Aggravating Factors  in /out of bed/car, on /off equipment.    Pain Relieving Factors Staying off it.,  elevation. warmth   Multiple Pain Sites No                         [facility] Adult PT Treatment/Exercise - 03/08/16 0001      Lumbar Exercises: Stretches   Double Knee to Chest Stretch --  10 X legs on ball     Lumbar Exercises: Aerobic   Stationary  Bike Nustep 6 miniutes.      Lumbar Exercises: Supine   Clam 1 second;10 reps  blue band,  cues for abdominals.    Other Supine Lumbar Exercises Dead bug 5 X.  Stopped due to anterior hip pain.       Knee/Hip Exercises: Standing   SLS with Vectors 3 way hip bilateral  2 sets of 10 X  no pain     Knee/Hip Exercises: Supine   Hip Adduction Isometric 1 set;10 reps   Bridges Limitations 5 second hold,  pillow squeeze.   Other Supine Knee/Hip Exercises Decompression 5 X 5 seconds 2 sets,  Right leg on pillow,  cues    Other Supine Knee/Hip Exercises Hip IR/ER 10 X AROM     Manual Therapy   Manual therapy comments Passive hip abduction 10 X                  PT Short Term Goals - 03/03/16 1417      PT SHORT TERM GOAL #1   Title pt will be I with inital HEP (01/10/2016)   Time 4   Period Weeks   Status Achieved     PT SHORT TERM GOAL #2   Title pt will be able to verbalize and demonstrate proper posture and lifting and carrying techniques to prevent and reduce low back pain (01/10/2016)   Time 4   Period Weeks   Status Achieved     PT SHORT TERM GOAL #3   Title pt will improve bil hip abductor/ extensor strength to >/= 4-/5 with >/= 2/10 pain in the low back to promote functional stability (01/10/2016)   Time 4   Period Weeks   Status Achieved     PT SHORT TERM GOAL #5   Title pt will improve trunk mobility by >/= 5 degrees in all planes with </= 2/10 pain and to promote functional progression (01/10/2016)   Time 4   Period Weeks   Status Achieved           PT Long Term Goals - 03/08/16 1441      PT LONG TERM GOAL #1   Title pt will be I with all HEP given as of last visit (02/05/2016)   Time 8   Period Weeks   Status On-going     PT LONG TERM GOAL #2   Title He will improve trunk flexion and side bending by >/= 10 degrees with </= 1/10 pain </= low guard with no report of feeling like he will fall to  promote functional trunk mobility and safety during ADLS  (10/21/7739)   Time 8   Period Weeks   Status Achieved     PT LONG TERM GOAL #3   Title pt will improve bil hip strength to >/= 4/5 to for functional endurance with walking/ standing and promote safety ( 02/05/2016)   Time 8   Period Weeks   Status Partially Met     PT LONG TERM GOAL #4   Title pt will be able to walk/ stand for >/= 45 minutes with LRAD with </= 1/10 pain to assist with functional endruance for ADLs and assist with pt's goal of returning to playing golf (02/05/2016)   Baseline up to 7/10   Time 8   Period Weeks   Status On-going     PT LONG TERM GOAL #5   Title at discharge pt with improve his FOTO score to >/= 72 to demonstrate improvement in function (02/05/2016)   Time 8   Period Weeks   Status On-going     PT LONG TERM GOAL #6   Title pt will improve his Berg balance score to >/= 45/56 to promote safety with walking/ standing and help with pt's goal with returning to playing golf (02/05/2016)   Time 8   Period Weeks   Status Achieved               Plan - 03/08/16 1436    Clinical Impression Statement Stabilization/  strengthening focus without increasing his pain was focus today.  He continues to use hands to move right leg on and off the mat/equipment.  He has been able to return to his full program at the gym.    PT Next Visit Plan There is lumbar vs hip in FOTO.  KX modifier.   stabilization.Hip strength.   PT Home Exercise Plan continue   Consulted and Agree with Plan of Care Patient      Patient will benefit from skilled therapeutic intervention in order to improve the following deficits and impairments:  Pain, Improper body mechanics, Postural dysfunction, Decreased balance, Decreased endurance, Decreased activity tolerance, Difficulty walking, Decreased range of motion, Abnormal gait, Hypomobility, Increased fascial restricitons, Decreased strength, Decreased mobility  Visit Diagnosis: Bilateral low back pain, with sciatica presence  unspecified  Muscle weakness (generalized)  Cramp and spasm  Other abnormalities of gait and mobility  Unsteadiness on feet     Problem List Patient Active Problem List   Diagnosis Date Noted  . S/P shoulder replacement 06/27/2015  . Residual cognitive deficit as late effect of stroke 04/15/2015  . Lung nodule   . Abnormal CXR 12/12/2014  . GI bleed 12/11/2014  . Weakness generalized 12/11/2014  . Leukocytosis 12/11/2014  . Restless leg syndrome 12/11/2014  . Anemia 12/11/2014  . Abnormal EKG 12/11/2014  . Gait disturbance, post-stroke 10/22/2014  . TIA (transient ischemic attack) 06/18/2014  . Partial seizures ([place]) 06/18/2014  . Seizure ([place]) 06/18/2014  . Vitamin B12 deficiency 06/18/2014  . OSA (obstructive sleep apnea) 02/26/2014  . Cognitive deficits as late effect of cerebrovascular disease 01/11/2014  . Fatigue 09/20/2013  . Flu-like symptoms 06/19/2013  . Intermittent lightheadedness 06/19/2013  . Low blood pressure, not hypotension 06/19/2013  . Influenza with respiratory manifestations 06/19/2013  . Embolic cerebral infarction ([place]) 04/09/2013  . CVA (cerebral infarction) 04/03/2013  . Occlusion and stenosis of carotid artery without mention of cerebral infarction 11/21/2012  . OA (osteoarthritis) of knee 12/20/2011  . Methotrexate, long term, current use 09/10/2011  .  Knee pain, left 07/27/2011  . Pacemaker-Medtronic 09/08/2010  . UNSPECIFIED VISUAL DISTURBANCE 07/20/2010  . SYNCOPE 06/01/2010  . IRON DEFIC ANEMIA [place] DIET IRON INTAKE 04/20/2010  . PSORIASIS 04/20/2010  . HEADACHE, PERSISTENT 01/13/2010  . HEMATURIA UNSPECIFIED 11/19/2009  . PHLEBITIS AND THROMBOPHLEBITIS OF OTHER SITE 07/02/2009  . CELLULITIS, LEG, LEFT 07/02/2009  . DISUSE OSTEOPOROSIS 05/12/2009  . HYPERGLYCEMIA, BORDERLINE 01/10/2009  . SPONDYLOSIS, LUMBAR, WITH RADICULOPATHY 10/15/2008  . OTHER SPECIFIED DERMATOMYCOSES 06/24/2008  . EXTRINSIC ASTHMA, WITH EXACERBATION  05/01/2008  . CALCU GALLBLADD W/OTH CHOLECYSTITIS&OBSTRUCTION 02/20/2008  . VARICOSE VEINS LOWER EXTREMITIES W/INFLAMMATION 12/25/2007  . EDEMA 12/25/2007  . INSOMNIA WITH SLEEP APNEA UNSPECIFIED 08/31/2007  . GERD 03/23/2007  . FIBRILLATION, ATRIAL 01/31/2007  . PSORIATIC ARTHRITIS 01/31/2007  . ABDOMINAL BLOATING 01/31/2007  . Hyperlipidemia 01/19/2007  . Essential hypertension 01/19/2007  . PROSTATE CANCER, HX OF 01/19/2007    Bensyn Bornemann PTA 03/08/2016, 2:44 PM  [facility] [address] [place], [place], [address] Phone: [phone]   Fax:  [phone]  Name: Christopher Baker MRN: [id number] Date of Birth: 1934-01-23

## 2016-03-09 ENCOUNTER — Other Ambulatory Visit: Payer: PPO

## 2016-03-09 DIAGNOSIS — G4733 Obstructive sleep apnea (adult) (pediatric): Secondary | ICD-10-CM | POA: Diagnosis not present

## 2016-03-10 ENCOUNTER — Ambulatory Visit: Payer: PPO | Admitting: Physical Therapy

## 2016-03-10 DIAGNOSIS — M6281 Muscle weakness (generalized): Secondary | ICD-10-CM

## 2016-03-10 DIAGNOSIS — R2689 Other abnormalities of gait and mobility: Secondary | ICD-10-CM

## 2016-03-10 DIAGNOSIS — M545 Low back pain: Secondary | ICD-10-CM | POA: Diagnosis not present

## 2016-03-10 DIAGNOSIS — R252 Cramp and spasm: Secondary | ICD-10-CM

## 2016-03-10 DIAGNOSIS — R2681 Unsteadiness on feet: Secondary | ICD-10-CM

## 2016-03-10 NOTE — Therapy (Signed)
[place], [place], [address] Phone: [phone]   Fax:  [phone]  Physical Therapy Treatment  Patient Details  Name: Christopher Baker MRN: [id number] Date of Birth: 22-Nov-1933 Referring Provider: Suella Broad Md.  Encounter Date: 03/10/2016      PT End of Session - 03/10/16 1339    Visit Number 22   Number of Visits 27   Date for PT Re-Evaluation 03/29/16   Authorization Type [employer]: Kx mod by 15th visit; progress note/ G code by 20th visit   PT Start Time 0136   PT Stop Time 0214   PT Time Calculation (min) 38 min      Past Medical History:  Diagnosis Date  . Arthritis with psoriasis ([place])   . Borderline diabetes    DIET CONTROLLED - PT STATES HE IS NOT DIABETIC  . Cancer [facility])    prostate  2008  . Carotid stenosis, bilateral PER DR EARLY / DUPLEX  09-09-10     40 - 59%   BILATERALLY--- ASYMPTOMATIC  . Cataract immature LEFT EYE  . Dysrhythmia    a-fib  . GERD (gastroesophageal reflux disease)    CONTROLLED W/ PROTONIX  . History of prostate cancer S/P RADIATION  TX  6 YRS AGO  . HTN (hypertension)   . Hyperlipidemia   . Iron deficiency   . Lumbar spondylosis W/ RADICULOPATHY  . PAF (paroxysmal atrial fibrillation) ([place])   . Restless leg syndrome   . Sleep apnea TESTED YRS AGO-- NO CPAP RX GIVEN   PT STATES HE DOES NOT THINK HE STILL HAS SLEEP APNEA--DOES NOT USE CPAP  . Status post placement of cardiac pacemaker DDD-  06-04-10-- LAST CHECK 09-08-10 IN EPIC   SECONDARY TO SYNCOPY AND BRADYCARDIA  . Stroke [facility]) Oct. 14, 2014   light left hand, balance issues, short term memory loss 2014  . SVT (supraventricular tachycardia) ([place])    S/P ABLATION   2008    Past Surgical History:  Procedure Laterality Date  . BACK SURGERY    . CARDIAC ELECTROPHYSIOLOGY STUDY AND ABLATION  2008   FOR SVT  . CARDIAC PACEMAKER PLACEMENT  06-04-10   DDD/ AND REMOVAL LOOR RECORDER  .  CHOLECYSTECTOMY  2009  . ESOPHAGOGASTRODUODENOSCOPY N/A 12/12/2014   Procedure: ESOPHAGOGASTRODUODENOSCOPY (EGD);  Surgeon: Wilford Corner, MD;  Location: [facility] ENDOSCOPY;  Service: Endoscopy;  Laterality: N/A;  . INGUINAL HERNIA REPAIR  2005   LEFT/   DONE WITH PENILE PROSTESIS SURG.  . JOINT REPLACEMENT    . KNEE ARTHROSCOPY  06/02/2011   Procedure: ARTHROSCOPY KNEE;  Surgeon: Gearlean Alf;  Location: [place];  Service: Orthopedics;  Laterality: Left;  LEFT KNEE ARTHROSCOPY WITH DEBRIDEMENT  . LOOP RECORDER PLACEMENT  01-30-10  . LUMBAR LAMINECTOMY/ DISKECTOMY/ FUSION  05-19-10   L4 - 5  . PENILE PROSTHESIS IMPLANT  1995  . PROSTATE BIOPSY  2007  . REMOVAL AND PLACEMENT PENILE PROSTHESIS IMPLANT   2005   AND LEFT CORPOROPLASTY /    DONE INGUINAL REPAIR  . REPLACEMENT TOTAL KNEE  1997   RIGHT  . SPINE SURGERY    . TOTAL KNEE ARTHROPLASTY  12/20/2011   Procedure: TOTAL KNEE ARTHROPLASTY;  Surgeon: Gearlean Alf, MD;  Location: [facility];  Service: Orthopedics;  Laterality: Left;  . TOTAL SHOULDER ARTHROPLASTY Right 06/27/2015   Procedure: REVERSE TOTAL SHOULDER ARTHROPLASTY;  Surgeon: Netta Cedars, MD;  Location: [place];  Service: Orthopedics;  Laterality: Right;  .  TRANSURETHRAL RESECTION OF PROSTATE  2006    There were no vitals filed for this visit.      Subjective Assessment - 03/10/16 1354    Currently in Pain? Yes   Pain Score 2    Pain Location Hip   Pain Orientation Right;Anterior   Pain Descriptors / Indicators Sharp                         [facility] Adult PT Treatment/Exercise - 03/10/16 0001      Lumbar Exercises: Aerobic   Stationary Bike Rec bike L2 x 5 minutes     Lumbar Exercises: Supine   Clam 20 reps;3 seconds  blue band,  cues for abdominals.    Clam Limitations 30 reps total   Bridge Limitations over physioball x 20,   Other Supine Lumbar Exercises ball squeeze x 30   Other Supine Lumbar Exercises physioball hamsting curls  with core brace, roll ball R and L x 20 each     Knee/Hip Exercises: Supine   Straight Leg Raises 10 reps   Straight Leg Raises Limitations with ab set, left only      Knee/Hip Exercises: Sidelying   Hip ABduction Right;2 sets;10 reps   Clams 10 x 2 with 3#, reverse clam 10 x 2 AROM                  PT Short Term Goals - 03/03/16 1417      PT SHORT TERM GOAL #1   Title pt will be I with inital HEP (01/10/2016)   Time 4   Period Weeks   Status Achieved     PT SHORT TERM GOAL #2   Title pt will be able to verbalize and demonstrate proper posture and lifting and carrying techniques to prevent and reduce low back pain (01/10/2016)   Time 4   Period Weeks   Status Achieved     PT SHORT TERM GOAL #3   Title pt will improve bil hip abductor/ extensor strength to >/= 4-/5 with >/= 2/10 pain in the low back to promote functional stability (01/10/2016)   Time 4   Period Weeks   Status Achieved     PT SHORT TERM GOAL #5   Title pt will improve trunk mobility by >/= 5 degrees in all planes with </= 2/10 pain and to promote functional progression (01/10/2016)   Time 4   Period Weeks   Status Achieved           PT Long Term Goals - 03/08/16 1441      PT LONG TERM GOAL #1   Title pt will be I with all HEP given as of last visit (02/05/2016)   Time 8   Period Weeks   Status On-going     PT LONG TERM GOAL #2   Title He will improve trunk flexion and side bending by >/= 10 degrees with </= 1/10 pain </= low guard with no report of feeling like he will fall to promote functional trunk mobility and safety during ADLS (3/97/6734)   Time 8   Period Weeks   Status Achieved     PT LONG TERM GOAL #3   Title pt will improve bil hip strength to >/= 4/5 to for functional endurance with walking/ standing and promote safety ( 02/05/2016)   Time 8   Period Weeks   Status Partially Met     PT LONG TERM GOAL #4   Title  pt will be able to walk/ stand for >/= 45 minutes with LRAD  with </= 1/10 pain to assist with functional endruance for ADLs and assist with pt's goal of returning to playing golf (02/05/2016)   Baseline up to 7/10   Time 8   Period Weeks   Status On-going     PT LONG TERM GOAL #5   Title at discharge pt with improve his FOTO score to >/= 72 to demonstrate improvement in function (02/05/2016)   Time 8   Period Weeks   Status On-going     PT LONG TERM GOAL #6   Title pt will improve his Berg balance score to >/= 45/56 to promote safety with walking/ standing and help with pt's goal with returning to playing golf (02/05/2016)   Time 8   Period Weeks   Status Achieved               Plan - 03/10/16 1400    Clinical Impression Statement Pt has not received results of CT scan. He plans to call MD today. No change in hip pain. Pt reports no lumbar pain recently. Focused pain free therex today until pt gets CT scan results. no additional goals achieved.    PT Next Visit Plan There is lumbar vs hip in FOTO.  KX modifier.   stabilization.Hip strength.? Results of CT scan?       Patient will benefit from skilled therapeutic intervention in order to improve the following deficits and impairments:  Pain, Improper body mechanics, Postural dysfunction, Decreased balance, Decreased endurance, Decreased activity tolerance, Difficulty walking, Decreased range of motion, Abnormal gait, Hypomobility, Increased fascial restricitons, Decreased strength, Decreased mobility  Visit Diagnosis: No diagnosis found.     Problem List Patient Active Problem List   Diagnosis Date Noted  . S/P shoulder replacement 06/27/2015  . Residual cognitive deficit as late effect of stroke 04/15/2015  . Lung nodule   . Abnormal CXR 12/12/2014  . GI bleed 12/11/2014  . Weakness generalized 12/11/2014  . Leukocytosis 12/11/2014  . Restless leg syndrome 12/11/2014  . Anemia 12/11/2014  . Abnormal EKG 12/11/2014  . Gait disturbance, post-stroke 10/22/2014  . TIA  (transient ischemic attack) 06/18/2014  . Partial seizures ([place]) 06/18/2014  . Seizure ([place]) 06/18/2014  . Vitamin B12 deficiency 06/18/2014  . OSA (obstructive sleep apnea) 02/26/2014  . Cognitive deficits as late effect of cerebrovascular disease 01/11/2014  . Fatigue 09/20/2013  . Flu-like symptoms 06/19/2013  . Intermittent lightheadedness 06/19/2013  . Low blood pressure, not hypotension 06/19/2013  . Influenza with respiratory manifestations 06/19/2013  . Embolic cerebral infarction ([place]) 04/09/2013  . CVA (cerebral infarction) 04/03/2013  . Occlusion and stenosis of carotid artery without mention of cerebral infarction 11/21/2012  . OA (osteoarthritis) of knee 12/20/2011  . Methotrexate, long term, current use 09/10/2011  . Knee pain, left 07/27/2011  . Pacemaker-Medtronic 09/08/2010  . UNSPECIFIED VISUAL DISTURBANCE 07/20/2010  . SYNCOPE 06/01/2010  . IRON DEFIC ANEMIA [place] DIET IRON INTAKE 04/20/2010  . PSORIASIS 04/20/2010  . HEADACHE, PERSISTENT 01/13/2010  . HEMATURIA UNSPECIFIED 11/19/2009  . PHLEBITIS AND THROMBOPHLEBITIS OF OTHER SITE 07/02/2009  . CELLULITIS, LEG, LEFT 07/02/2009  . DISUSE OSTEOPOROSIS 05/12/2009  . HYPERGLYCEMIA, BORDERLINE 01/10/2009  . SPONDYLOSIS, LUMBAR, WITH RADICULOPATHY 10/15/2008  . OTHER SPECIFIED DERMATOMYCOSES 06/24/2008  . EXTRINSIC ASTHMA, WITH EXACERBATION 05/01/2008  . CALCU GALLBLADD W/OTH CHOLECYSTITIS&OBSTRUCTION 02/20/2008  . VARICOSE VEINS LOWER EXTREMITIES W/INFLAMMATION 12/25/2007  . EDEMA 12/25/2007  . INSOMNIA WITH SLEEP APNEA UNSPECIFIED 08/31/2007  .  GERD 03/23/2007  . FIBRILLATION, ATRIAL 01/31/2007  . PSORIATIC ARTHRITIS 01/31/2007  . ABDOMINAL BLOATING 01/31/2007  . Hyperlipidemia 01/19/2007  . Essential hypertension 01/19/2007  . PROSTATE CANCER, HX OF 01/19/2007    Dorene Ar, PTA 03/10/2016, 2:31 PM  [facility] [address] [place],  [place], [address] Phone: [phone]   Fax:  [phone]  Name: Christopher Baker MRN: [id number] Date of Birth: Apr 04, 1934

## 2016-03-11 ENCOUNTER — Encounter: Payer: Self-pay | Admitting: Psychology

## 2016-03-11 ENCOUNTER — Ambulatory Visit (INDEPENDENT_AMBULATORY_CARE_PROVIDER_SITE_OTHER): Payer: PPO | Admitting: Psychology

## 2016-03-11 DIAGNOSIS — I69919 Unspecified symptoms and signs involving cognitive functions following unspecified cerebrovascular disease: Secondary | ICD-10-CM | POA: Diagnosis not present

## 2016-03-11 DIAGNOSIS — F329 Major depressive disorder, single episode, unspecified: Secondary | ICD-10-CM

## 2016-03-11 DIAGNOSIS — F32A Depression, unspecified: Secondary | ICD-10-CM

## 2016-03-11 DIAGNOSIS — I679 Cerebrovascular disease, unspecified: Secondary | ICD-10-CM

## 2016-03-11 NOTE — Progress Notes (Signed)
NEUROPSYCHOLOGICAL INTERVIEW (CPT: [id number])  Name: Christopher Baker Date of Birth: [birth year] Date of Interview: 03/11/2016  Reason for Referral:  Christopher Baker is a 82 y.o., right-handed male who is referred for neuropsychological evaluation by Dr. Metta Clines of [facility] due to concerns about memory decline. This patient is unaccompanied in the office for today's visit.  History of Presenting Problem:  [name] sustained a cardio-embolic stroke in [id number] with left hand and arm weakness and facial droop. CT of the head revealed right basal ganglia and parietal lobe infarcts. In 05/2014, he had an episode of confusion and left facial droop and was admitted to [facility]. CTs of the head revealed no abnormality. TIA was suspected. The patient also has a history of B12 deficiency and did report improvement in memory after starting B12 injections.   The patient reportedly had neuropsychological evaluation performed by Dr. Valentina Shaggy in January 2016; however, these records are not available for my review. According to Dr. Georgie Chard notes, his neuropsychological profile was consistent with right hemishpere stroke, with  deficits in visual processing speed, non-dominant hand fine motor coordination/speed, and visual-spatial dysfunction.   On 03/03/2016, at his visit with Dr. Tomi Likens, he scored 22/30 on the MoCA. At that time, he reported steady decline over last year in physical functioning. His wife reported staring spells of brief duration, and an EEG has been scheduled. The patient endorsed depression secondary to reduced physical functioning.   At today's appointment, the patient complains of short term memory difficulties, which he believes predated his stroke. (His records do indicate subjective cognitive complaints upon his first visit with Dr. Tomi Likens in 02/2013-prior to his stroke.) He believes his memory difficulties have worsened over time. When asked about recent staring spells, he reported  that in the middle of a task, such as getting dressed or putting on his shoes, he will find himself not knowing what he was just doing.  Upon direct questioning, the patient reported the following:   Forgetting recent conversations/events: Yes Repeating statements/questions: Yes Misplacing/losing items: Yes Forgetting appointments or other obligations: Yes (wife does)  Forgetting to take medications: Yes (wife does)  Difficulty concentrating: Yes Starting but not finishing tasks: Yes Distracted easily: Yes Processing information more slowly: Yes  Word-finding difficulty: Yes Writing difficulty: No Spelling difficulty: No Comprehension difficulty: Sometimes  Getting lost when driving: No Making wrong turns when driving: No Uncertain about directions when driving or passenger: No Any MVAs in the past year: No   Current Functioning: [name] lives with his wife in their own home. 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 He denies any problems with this. He continues to drive. His wife manages his appointments and his medications. He assists his wife with meal preparation.  Physically, the patient reports reduced functioning secondary to a bad back/hip. He has to use a cane to ambulate. His balance is "way off". He has not had any recent falls prior to this appointment. He reported "near falls" though. [NOTE: The patient sustained a fall in the waiting room after leaving my office. He apparently fell forward onto the carpet and did hit his face and bloodied his nose. Nurses came to his attention immediately, and Dr. Tomi Likens evaluated him prior to his leaving our office.]   He has been doing physical therapy which he feels is helpful. He has been unable to play golf in about two years. As a result of reduced physical ability,  he is feeling depressed. He reported some past history of depression as well. He has never had mental health  treatment, however. He denied any significant anxiety or psychosocial stress. He endorsed significant sleep difficulty (mostly with staying asleep). He does have a CPAP which he tries to use nightly. His appetite is adequate and improved. He denies suicidal ideation or intention. He was recently tapered off citalopram and has started sertraline to see if he has a better response to this.  Of note, he endorsed visual illusions or hallucinations. He reported that he has been seeing "shadows" and will think he saw someone running through the door in his peripheral vision. He finds this disconcerting. He thinks it seems to be improving since he underwent cataract surgery recently.   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 Marital history: Married to his current wife x38 years. This is his third marriage. Two children (in their 33s), four grandchildren. Alcohol/Tobacco/Substances: Occasional alcohol. "I do enjoy an evening cocktail." Never has been a heavy drinker. Former smoker (quit at least 20 years ago). No substance abuse.   Medical History: Past Medical History:  Diagnosis Date  . Arthritis with psoriasis ([place])   . Borderline diabetes    DIET CONTROLLED - PT STATES HE IS NOT DIABETIC  . Cancer [facility])    prostate  2008  . Carotid stenosis, bilateral PER DR EARLY / DUPLEX  09-09-10     40 - 59%   BILATERALLY--- ASYMPTOMATIC  . Cataract immature LEFT EYE  . Dysrhythmia    a-fib  . GERD (gastroesophageal reflux disease)    CONTROLLED W/ PROTONIX  . History of prostate cancer S/P RADIATION  TX  6 YRS AGO  . HTN (hypertension)   . Hyperlipidemia   . Iron deficiency   . Lumbar spondylosis W/ RADICULOPATHY  . PAF (paroxysmal atrial fibrillation) ([place])   . Restless  leg syndrome   . Sleep apnea TESTED YRS AGO-- NO CPAP RX GIVEN   PT STATES HE DOES NOT THINK HE STILL HAS SLEEP APNEA--DOES NOT USE CPAP  . Status post placement of cardiac pacemaker DDD-  06-04-10-- LAST CHECK 09-08-10 IN EPIC   SECONDARY TO SYNCOPY AND BRADYCARDIA  . Stroke [facility]) Oct. 14, 2014   light left hand, balance issues, short term memory loss 2014  . SVT (supraventricular tachycardia) ([place])    S/P ABLATION   2008     Current Medications:  Outpatient Encounter Prescriptions as of 03/11/2016  Medication Sig  . apixaban (ELIQUIS) 5 MG TABS tablet Take 1 tablet (5 mg total) by mouth 2 (two) times daily.  Marland Kitchen atorvastatin (LIPITOR) 20 MG tablet Take 20 mg by mouth daily.  . clotrimazole-betamethasone (LOTRISONE) cream Apply 1 application topically 2 (two) times daily as needed (psoriasis).  Marland Kitchen docusate sodium (COLACE) 100 MG capsule Take 100 mg by mouth as needed. Reported on 12/11/2015  . ezetimibe (ZETIA) 10 MG tablet Take 1 tablet (10 mg total) by mouth daily.  . folic acid (FOLVITE) 1 MG tablet Take 1 mg by mouth daily.  . furosemide (LASIX) 20 MG tablet Take 2 tablets (40 mg total) by mouth daily.  Marland Kitchen lisinopril (PRINIVIL,ZESTRIL) 5 MG tablet Take 1 tablet (5 mg total) by mouth daily.  . methotrexate (RHEUMATREX) 2.5 MG tablet Take  4 tablets (10 mg total) by mouth once a week. Caution:Chemotherapy. Protect from light. Every monday  . mirabegron ER (MYRBETRIQ) 50 MG TB24 tablet Take 1 tablet (50 mg total) by mouth daily.  . pantoprazole (PROTONIX) 40 MG tablet Take 1 tablet (40 mg total) by mouth daily.  . predniSONE (DELTASONE) 5 MG tablet Take 1.5 tablets (7.5 mg total) by mouth daily.  . sertraline (ZOLOFT) 25 MG tablet Take 1 tablet daily for 7 days, then increase to 2 tablets daily  . triamcinolone cream (KENALOG) 0.1 % Apply to affected area as directed (Patient taking differently: Apply 1 application topically daily as needed (rash). Apply to affected area as directed)   No  facility-administered encounter medications on file as of 03/11/2016.      Behavioral Observations:   Appearance: Neatly and appropriately dressed and groomed, appearing somwhat younger than his chronological age Gait: Ambulated with a cane, moderate unsteadiness Speech: Fluent; normal rate, rhythm and volume Thought process: Linear, goal directed Affect: Mildly blunted, generally euthymic Interpersonal: Pleasant, appropriate   TESTING: There is medical necessity to proceed with neuropsychological assessment as the results will be used to aid in differential diagnosis and clinical decision-making and to inform specific treatment recommendations. Per the patient and medical records reviewed, there has been a change in cognitive functioning and a reasonable suspicion of dementia.   PLAN: The patient will return for a full battery of neuropsychological testing with a psychometrician under my supervision. Education regarding testing procedures was provided. Subsequently, the patient will see this provider for a follow-up session at which time his test performances and my impressions and treatment recommendations will be reviewed in detail.   Full neuropsychological evaluation report to follow.

## 2016-03-12 ENCOUNTER — Ambulatory Visit
Admission: RE | Admit: 2016-03-12 | Discharge: 2016-03-12 | Disposition: A | Discharge status: Home / Self Care | Payer: PPO | Source: Ambulatory Visit | Attending: Internal Medicine | Admitting: Internal Medicine

## 2016-03-12 ENCOUNTER — Other Ambulatory Visit: Payer: Self-pay | Admitting: Internal Medicine

## 2016-03-12 DIAGNOSIS — Z7901 Long term (current) use of anticoagulants: Secondary | ICD-10-CM | POA: Diagnosis not present

## 2016-03-12 DIAGNOSIS — S0030XA Unspecified superficial injury of nose, initial encounter: Secondary | ICD-10-CM | POA: Diagnosis not present

## 2016-03-12 DIAGNOSIS — R51 Headache: Secondary | ICD-10-CM | POA: Diagnosis not present

## 2016-03-12 DIAGNOSIS — S299XXA Unspecified injury of thorax, initial encounter: Secondary | ICD-10-CM | POA: Diagnosis not present

## 2016-03-12 DIAGNOSIS — R609 Edema, unspecified: Secondary | ICD-10-CM

## 2016-03-12 DIAGNOSIS — D509 Iron deficiency anemia, unspecified: Secondary | ICD-10-CM | POA: Diagnosis not present

## 2016-03-12 DIAGNOSIS — I10 Essential (primary) hypertension: Secondary | ICD-10-CM | POA: Diagnosis not present

## 2016-03-12 DIAGNOSIS — T148XXA Other injury of unspecified body region, initial encounter: Secondary | ICD-10-CM

## 2016-03-12 DIAGNOSIS — Z6829 Body mass index (BMI) 29.0-29.9, adult: Secondary | ICD-10-CM | POA: Diagnosis not present

## 2016-03-12 DIAGNOSIS — S0993XA Unspecified injury of face, initial encounter: Secondary | ICD-10-CM | POA: Diagnosis not present

## 2016-03-15 ENCOUNTER — Encounter: Payer: PPO | Admitting: *Deleted

## 2016-03-15 ENCOUNTER — Ambulatory Visit (INDEPENDENT_AMBULATORY_CARE_PROVIDER_SITE_OTHER): Payer: PPO | Admitting: Psychology

## 2016-03-15 DIAGNOSIS — I10 Essential (primary) hypertension: Secondary | ICD-10-CM | POA: Diagnosis not present

## 2016-03-15 DIAGNOSIS — I69919 Unspecified symptoms and signs involving cognitive functions following unspecified cerebrovascular disease: Secondary | ICD-10-CM

## 2016-03-15 DIAGNOSIS — D509 Iron deficiency anemia, unspecified: Secondary | ICD-10-CM | POA: Diagnosis not present

## 2016-03-15 NOTE — Progress Notes (Signed)
   Neuropsychology Note  Christopher Baker returned today for 2 hours of neuropsychological testing with technician, Christopher Baker, BS, under the supervision of Christopher Baker. The patient did not appear overtly distressed by the testing session, per behavioral observation or via self-report to the technician. Rest breaks were offered. Christopher Baker will return within 2 weeks for a feedback session with Christopher Baker at which time his test performances, clinical impressions and treatment recommendations will be reviewed in detail. The patient understands he can contact our office should he require our assistance before this time.  Full report to follow.

## 2016-03-16 ENCOUNTER — Ambulatory Visit: Payer: PPO | Admitting: Physical Therapy

## 2016-03-16 DIAGNOSIS — L4059 Other psoriatic arthropathy: Secondary | ICD-10-CM | POA: Diagnosis not present

## 2016-03-16 DIAGNOSIS — M545 Low back pain: Secondary | ICD-10-CM | POA: Diagnosis not present

## 2016-03-16 DIAGNOSIS — M6281 Muscle weakness (generalized): Secondary | ICD-10-CM

## 2016-03-16 DIAGNOSIS — R252 Cramp and spasm: Secondary | ICD-10-CM

## 2016-03-16 DIAGNOSIS — D509 Iron deficiency anemia, unspecified: Secondary | ICD-10-CM | POA: Diagnosis not present

## 2016-03-16 DIAGNOSIS — R2689 Other abnormalities of gait and mobility: Secondary | ICD-10-CM

## 2016-03-16 DIAGNOSIS — R2681 Unsteadiness on feet: Secondary | ICD-10-CM

## 2016-03-16 NOTE — Therapy (Addendum)
[place], [place], [address] Phone: [phone]   Fax:  [phone]  Physical Therapy Treatment  Patient Details  Name: Christopher Baker MRN: [id number] Date of Birth: 1933-11-09 Referring Provider: Suella Broad Md.  Encounter Date: 03/16/2016      PT End of Session - 03/16/16 1331    Visit Number 23   Number of Visits 27   Date for PT Re-Evaluation 03/29/16   Authorization Type [employer]: Kx mod by 15th visit; progress note/ G code by 20th visit   PT Start Time 1328   PT Stop Time 1410   PT Time Calculation (min) 42 min   Activity Tolerance Patient tolerated treatment well   Behavior During Therapy WFL for tasks assessed/performed      Past Medical History:  Diagnosis Date  . Arthritis with psoriasis ([place])   . Borderline diabetes    DIET CONTROLLED - PT STATES HE IS NOT DIABETIC  . Cancer [facility])    prostate  2008  . Carotid stenosis, bilateral PER DR EARLY / DUPLEX  09-09-10     40 - 59%   BILATERALLY--- ASYMPTOMATIC  . Cataract immature LEFT EYE  . Dysrhythmia    a-fib  . GERD (gastroesophageal reflux disease)    CONTROLLED W/ PROTONIX  . History of prostate cancer S/P RADIATION  TX  6 YRS AGO  . HTN (hypertension)   . Hyperlipidemia   . Iron deficiency   . Lumbar spondylosis W/ RADICULOPATHY  . PAF (paroxysmal atrial fibrillation) ([place])   . Restless leg syndrome   . Sleep apnea TESTED YRS AGO-- NO CPAP RX GIVEN   PT STATES HE DOES NOT THINK HE STILL HAS SLEEP APNEA--DOES NOT USE CPAP  . Status post placement of cardiac pacemaker DDD-  06-04-10-- LAST CHECK 09-08-10 IN EPIC   SECONDARY TO SYNCOPY AND BRADYCARDIA  . Stroke [facility]) Oct. 14, 2014   light left hand, balance issues, short term memory loss 2014  . SVT (supraventricular tachycardia) ([place])    S/P ABLATION   2008    Past Surgical History:  Procedure Laterality Date  . BACK SURGERY    . CARDIAC ELECTROPHYSIOLOGY  STUDY AND ABLATION  2008   FOR SVT  . CARDIAC PACEMAKER PLACEMENT  06-04-10   DDD/ AND REMOVAL LOOR RECORDER  . CHOLECYSTECTOMY  2009  . ESOPHAGOGASTRODUODENOSCOPY N/A 12/12/2014   Procedure: ESOPHAGOGASTRODUODENOSCOPY (EGD);  Surgeon: Wilford Corner, MD;  Location: [facility] ENDOSCOPY;  Service: Endoscopy;  Laterality: N/A;  . INGUINAL HERNIA REPAIR  2005   LEFT/   DONE WITH PENILE PROSTESIS SURG.  . JOINT REPLACEMENT    . KNEE ARTHROSCOPY  06/02/2011   Procedure: ARTHROSCOPY KNEE;  Surgeon: Gearlean Alf;  Location: [place];  Service: Orthopedics;  Laterality: Left;  LEFT KNEE ARTHROSCOPY WITH DEBRIDEMENT  . LOOP RECORDER PLACEMENT  01-30-10  . LUMBAR LAMINECTOMY/ DISKECTOMY/ FUSION  05-19-10   L4 - 5  . PENILE PROSTHESIS IMPLANT  1995  . PROSTATE BIOPSY  2007  . REMOVAL AND PLACEMENT PENILE PROSTHESIS IMPLANT   2005   AND LEFT CORPOROPLASTY /    DONE INGUINAL REPAIR  . REPLACEMENT TOTAL KNEE  1997   RIGHT  . SPINE SURGERY    . TOTAL KNEE ARTHROPLASTY  12/20/2011   Procedure: TOTAL KNEE ARTHROPLASTY;  Surgeon: Gearlean Alf, MD;  Location: [facility];  Service: Orthopedics;  Laterality: Left;  . TOTAL SHOULDER ARTHROPLASTY Right 06/27/2015   Procedure: REVERSE TOTAL  SHOULDER ARTHROPLASTY;  Surgeon: Netta Cedars, MD;  Location: [place];  Service: Orthopedics;  Laterality: Right;  . TRANSURETHRAL RESECTION OF PROSTATE  2006    There were no vitals filed for this visit.      Subjective Assessment - 03/16/16 1331    Subjective I fell on my face, knee, chest while walking through the lobby in my doctor's office.    Currently in Pain? Yes   Pain Score 2    Pain Location Back  and right hip   Pain Descriptors / Indicators Dull   Aggravating Factors  fall    Pain Relieving Factors resting, elevation, warmth                         [facility] Adult PT Treatment/Exercise - 03/16/16 0001      Lumbar Exercises: Aerobic   Stationary Bike Nustep L3 LE only 5  minutes      Lumbar Exercises: Supine   Clam 20 reps;3 seconds  blue band,  cues for abdominals.    Clam Limitations 30 reps total   Bridge Limitations Glute Bridge 10 x 2    Other Supine Lumbar Exercises ball squeeze x 30     Knee/Hip Exercises: Supine   Straight Leg Raises 10 reps   Straight Leg Raises Limitations with ab set, left only      Knee/Hip Exercises: Sidelying   Hip ABduction Right;2 sets;10 reps   Clams 10 x 2 with 3#, reverse clam 10 x 2 AROM                PT Education - 03/16/16 1443    Education provided Yes   Education Details that pt needs to be bringing his wife in with him for the rest of his visits, discussed with pt concerns of todays session findings regarding RLE compared bil and that plan to place him no hold until he returns back to his MD for further assessment.    Person(s) Educated Patient   Methods Explanation;Verbal cues   Comprehension Verbalized understanding;Verbal cues required          PT Short Term Goals - 03/03/16 1417      PT SHORT TERM GOAL #1   Title pt will be I with inital HEP (01/10/2016)   Time 4   Period Weeks   Status Achieved     PT SHORT TERM GOAL #2   Title pt will be able to verbalize and demonstrate proper posture and lifting and carrying techniques to prevent and reduce low back pain (01/10/2016)   Time 4   Period Weeks   Status Achieved     PT SHORT TERM GOAL #3   Title pt will improve bil hip abductor/ extensor strength to >/= 4-/5 with >/= 2/10 pain in the low back to promote functional stability (01/10/2016)   Time 4   Period Weeks   Status Achieved     PT SHORT TERM GOAL #5   Title pt will improve trunk mobility by >/= 5 degrees in all planes with </= 2/10 pain and to promote functional progression (01/10/2016)   Time 4   Period Weeks   Status Achieved           PT Long Term Goals - 03/08/16 1441      PT LONG TERM GOAL #1   Title pt will be I with all HEP given as of last visit (02/05/2016)    Time 8   Period Weeks  Status On-going     PT LONG TERM GOAL #2   Title He will improve trunk flexion and side bending by >/= 10 degrees with </= 1/10 pain </= low guard with no report of feeling like he will fall to promote functional trunk mobility and safety during ADLS (0/53/9767)   Time 8   Period Weeks   Status Achieved     PT LONG TERM GOAL #3   Title pt will improve bil hip strength to >/= 4/5 to for functional endurance with walking/ standing and promote safety ( 02/05/2016)   Time 8   Period Weeks   Status Partially Met     PT LONG TERM GOAL #4   Title pt will be able to walk/ stand for >/= 45 minutes with LRAD with </= 1/10 pain to assist with functional endruance for ADLs and assist with pt's goal of returning to playing golf (02/05/2016)   Baseline up to 7/10   Time 8   Period Weeks   Status On-going     PT LONG TERM GOAL #5   Title at discharge pt with improve his FOTO score to >/= 72 to demonstrate improvement in function (02/05/2016)   Time 8   Period Weeks   Status On-going     PT LONG TERM GOAL #6   Title pt will improve his Berg balance score to >/= 45/56 to promote safety with walking/ standing and help with pt's goal with returning to playing golf (02/05/2016)   Time 8   Period Weeks   Status Achieved               Plan - 03/16/16 1429    Clinical Impression Statement Mr. Fiumara continues to demostrate pain in the R proximal hip noticed during active flexion activities. Upon further assessment and palpation revealed minimal soreness at the rectus femoris/ and TFL, but noted increased soreness progressing medial to the rectus femoris which was similar to the pain he noted having. Palpation of the R femoral artery in the femoral triangle, pt reported a pinching with soreness. Also significant swelling is palbale and observable in the RLE compared bil with palpable warmth that was different compared bil. plan to place pt on hold until he has been  assessed further by his referring MD. Discussed this with pt and he demonstrated understanding.    PT Next Visit Plan There is lumbar vs hip in FOTO.  KX modifier.   stabilization.Hip strength. On hold until seen by referring MD   Consulted and Agree with Plan of Care Patient      Patient will benefit from skilled therapeutic intervention in order to improve the following deficits and impairments:  Pain, Improper body mechanics, Postural dysfunction, Decreased balance, Decreased endurance, Decreased activity tolerance, Difficulty walking, Decreased range of motion, Abnormal gait, Hypomobility, Increased fascial restricitons, Decreased strength, Decreased mobility  Visit Diagnosis: Bilateral low back pain, with sciatica presence unspecified  Muscle weakness (generalized)  Cramp and spasm  Other abnormalities of gait and mobility  Unsteadiness on feet     Problem List Patient Active Problem List   Diagnosis Date Noted  . S/P shoulder replacement 06/27/2015  . Residual cognitive deficit as late effect of stroke 04/15/2015  . Lung nodule   . Abnormal CXR 12/12/2014  . GI bleed 12/11/2014  . Weakness generalized 12/11/2014  . Leukocytosis 12/11/2014  . Restless leg syndrome 12/11/2014  . Anemia 12/11/2014  . Abnormal EKG 12/11/2014  . Gait disturbance, post-stroke 10/22/2014  . TIA (  transient ischemic attack) 06/18/2014  . Partial seizures ([place]) 06/18/2014  . Seizure ([place]) 06/18/2014  . Vitamin B12 deficiency 06/18/2014  . OSA (obstructive sleep apnea) 02/26/2014  . Cognitive deficits as late effect of cerebrovascular disease 01/11/2014  . Fatigue 09/20/2013  . Flu-like symptoms 06/19/2013  . Intermittent lightheadedness 06/19/2013  . Low blood pressure, not hypotension 06/19/2013  . Influenza with respiratory manifestations 06/19/2013  . Embolic cerebral infarction ([place]) 04/09/2013  . CVA (cerebral infarction) 04/03/2013  . Occlusion and stenosis of carotid artery without  mention of cerebral infarction 11/21/2012  . OA (osteoarthritis) of knee 12/20/2011  . Methotrexate, long term, current use 09/10/2011  . Knee pain, left 07/27/2011  . Pacemaker-[employer] 09/08/2010  . UNSPECIFIED VISUAL DISTURBANCE 07/20/2010  . SYNCOPE 06/01/2010  . IRON DEFIC ANEMIA [place] DIET IRON INTAKE 04/20/2010  . PSORIASIS 04/20/2010  . HEADACHE, PERSISTENT 01/13/2010  . HEMATURIA UNSPECIFIED 11/19/2009  . PHLEBITIS AND THROMBOPHLEBITIS OF OTHER SITE 07/02/2009  . CELLULITIS, LEG, LEFT 07/02/2009  . DISUSE OSTEOPOROSIS 05/12/2009  . HYPERGLYCEMIA, BORDERLINE 01/10/2009  . SPONDYLOSIS, LUMBAR, WITH RADICULOPATHY 10/15/2008  . OTHER SPECIFIED DERMATOMYCOSES 06/24/2008  . EXTRINSIC ASTHMA, WITH EXACERBATION 05/01/2008  . CALCU GALLBLADD W/OTH CHOLECYSTITIS&OBSTRUCTION 02/20/2008  . VARICOSE VEINS LOWER EXTREMITIES W/INFLAMMATION 12/25/2007  . EDEMA 12/25/2007  . INSOMNIA WITH SLEEP APNEA UNSPECIFIED 08/31/2007  . GERD 03/23/2007  . FIBRILLATION, ATRIAL 01/31/2007  . PSORIATIC ARTHRITIS 01/31/2007  . ABDOMINAL BLOATING 01/31/2007  . Hyperlipidemia 01/19/2007  . Essential hypertension 01/19/2007  . PROSTATE CANCER, HX OF 01/19/2007   Starr Lake PT, DPT, LAT, ATC  03/16/16  2:51 PM  Hessie Diener, PTA 03/17/16 8:19 AM Phone: [phone] Fax: [facility] [address] [place], [place], [address] Phone: [phone]   Fax:  [phone]  Name: Christopher Baker MRN: [id number] Date of Birth: 18-[name]-1934

## 2016-03-17 ENCOUNTER — Other Ambulatory Visit: Payer: Self-pay | Admitting: *Deleted

## 2016-03-17 ENCOUNTER — Ambulatory Visit (INDEPENDENT_AMBULATORY_CARE_PROVIDER_SITE_OTHER): Payer: PPO | Admitting: Neurology

## 2016-03-17 DIAGNOSIS — R569 Unspecified convulsions: Secondary | ICD-10-CM

## 2016-03-18 ENCOUNTER — Ambulatory Visit: Payer: PPO | Admitting: Physical Therapy

## 2016-03-18 DIAGNOSIS — M1611 Unilateral primary osteoarthritis, right hip: Secondary | ICD-10-CM | POA: Diagnosis not present

## 2016-03-18 DIAGNOSIS — M5416 Radiculopathy, lumbar region: Secondary | ICD-10-CM | POA: Diagnosis not present

## 2016-03-18 DIAGNOSIS — M47816 Spondylosis without myelopathy or radiculopathy, lumbar region: Secondary | ICD-10-CM | POA: Diagnosis not present

## 2016-03-19 ENCOUNTER — Encounter: Payer: Self-pay | Admitting: Cardiology

## 2016-03-21 NOTE — Progress Notes (Signed)
NEUROPSYCHOLOGICAL EVALUATION   Name:    Christopher Baker  Date of Birth:   02-May-1934 Date of Interview:  03/11/2016 Date of Testing:  03/15/2016   Date of Feedback:  03/22/2016       Background Information:  Reason for Referral:  Christopher Baker is a 82 y.o., right-handed, married male referred by Dr. Metta Clines to assess his current level of cognitive functioning and assist in differential diagnosis. The current evaluation consisted of a review of available medical records, an interview with the patient, and the completion of a neuropsychological testing battery. Informed consent was obtained.  History of Presenting Problem:  Christopher Baker sustained a cardio-embolic stroke in [id number] with left hand and arm weakness and facial droop. CT of the head revealed right basal ganglia and parietal lobe infarcts. In 05/2014, he had an episode of confusion and left facial droop and was admitted to [facility]. CTs of the head revealed no abnormality. TIA was suspected. The patient also has a history of B12 deficiency and did report improvement in memory after starting B12 injections.   The patient reportedly had neuropsychological evaluation performed by Dr. Valentina Shaggy in January 2016; however, these records are not available for my review. According to Dr. Georgie Chard notes, his neuropsychological profile was consistent with right hemishpere stroke, with  deficits in visual processing speed, non-dominant hand fine motor coordination/speed, and visual-spatial dysfunction.   On 03/03/2016, at his visit with Dr. Tomi Likens, he scored 22/30 on the MoCA. At that time, he reported steady decline over last year in physical functioning. His wife reported staring spells of brief duration, and an EEG has been scheduled. The patient endorsed depression secondary to reduced physical functioning.   At today's appointment, the patient complains of short term memory difficulties, which he believes predated his stroke. (His records  do indicate subjective cognitive complaints upon his first visit with Dr. Tomi Likens in 02/2013-prior to his stroke.) He believes his memory difficulties have worsened over time. When asked about recent staring spells, he reported that in the middle of a task, such as getting dressed or putting on his shoes, he will find himself not knowing what he was just doing.  Upon direct questioning, the patient reported the following:   Forgetting recent conversations/events: Yes Repeating statements/questions: Yes Misplacing/losing items: Yes Forgetting appointments or other obligations: Yes (wife does)  Forgetting to take medications: Yes (wife does)  Difficulty concentrating: Yes Starting but not finishing tasks: Yes Distracted easily: Yes Processing information more slowly: Yes  Word-finding difficulty: Yes Writing difficulty: No Spelling difficulty: No Comprehension difficulty: Sometimes  Getting lost when driving: No Making wrong turns when driving: No Uncertain about directions when driving or passenger: No Any MVAs in the past year: No   Current Functioning: Christopher Baker lives with his wife in their own home. 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 He denies any problems with this. He continues to drive. His wife manages his appointments and his medications. He assists his wife with meal preparation.  Physically, the patient reports reduced functioning secondary to a bad back/hip. He has to use a cane to ambulate. His balance is "way off". He has not had any recent falls prior to this appointment. He reported "near falls" though. [NOTE: The patient sustained a fall in the waiting room after leaving my office on 03/11/2016. He apparently fell forward onto the carpet and did hit his face and bloodied his nose. Nurses  came to his attention immediately, and Dr. Tomi Likens evaluated him prior to his leaving our office.]   He has been doing  physical therapy which he feels is helpful. He has been unable to play golf in about two years. As a result of reduced physical ability, he is feeling depressed. He reported some past history of depression as well. He has never had mental health treatment, however. He denied any significant anxiety or psychosocial stress. He endorsed significant sleep difficulty (mostly with staying asleep). He does have a CPAP which he tries to use nightly. His appetite is adequate and improved. He denies suicidal ideation or intention. He was recently tapered off citalopram and has started sertraline to see if he has a better response to this.  Of note, he endorsed visual illusions or hallucinations. He reported that he has been seeing "shadows" and will think he saw someone running through the door in his peripheral vision. He finds this disconcerting. He thinks it seems to be improving since he underwent cataract surgery recently.  Also of note, the patient's wife reported at his testing appointment on 03/15/2016, that recent labwork revealed very low level of vitamin B12.   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 Marital history: Married to his current wife x38 years. This is his third marriage. Two children (in their 108s), four grandchildren. Alcohol/Tobacco/Substances: Occasional alcohol. "I do enjoy an evening cocktail." Never has been a heavy drinker. Former smoker (quit at least 20 years ago). No substance abuse.   Medical History:  Past Medical History:  Diagnosis Date  . Arthritis with psoriasis ([place])   . Borderline diabetes    DIET CONTROLLED - PT STATES HE IS NOT DIABETIC  . Cancer [facility])    prostate  2008  . Carotid stenosis, bilateral PER DR EARLY / DUPLEX  09-09-10       40 - 59%   BILATERALLY--- ASYMPTOMATIC  . Cataract immature LEFT EYE  . Dysrhythmia    a-fib  . GERD (gastroesophageal reflux disease)    CONTROLLED W/ PROTONIX  . History of prostate cancer S/P RADIATION  [place]  6 YRS AGO  . HTN (hypertension)   . Hyperlipidemia   . Iron deficiency   . Lumbar spondylosis W/ RADICULOPATHY  . PAF (paroxysmal atrial fibrillation) ([place])   . Restless leg syndrome   . Sleep apnea TESTED YRS AGO-- NO CPAP RX GIVEN   PT STATES HE DOES NOT THINK HE STILL HAS SLEEP APNEA--DOES NOT USE CPAP  . Status post placement of cardiac pacemaker DDD-  06-04-10-- LAST CHECK 09-08-10 IN EPIC   SECONDARY TO SYNCOPY AND BRADYCARDIA  . Stroke [facility]) Oct. 14, 2014   light left hand, balance issues, short term memory loss 2014  . SVT (supraventricular tachycardia) ([place])    S/P ABLATION   2008    Current medications:  Outpatient Encounter Prescriptions as of 03/22/2016  Medication Sig  . apixaban (ELIQUIS) 5 MG TABS tablet Take 1 tablet (5 mg total) by mouth 2 (two) times daily.  Marland Kitchen atorvastatin (LIPITOR) 20 MG tablet Take 20 mg by mouth daily.  . clotrimazole-betamethasone (LOTRISONE) cream Apply 1 application topically 2 (two) times daily as needed (psoriasis).  Marland Kitchen docusate sodium (COLACE) 100 MG capsule Take 100 mg by mouth as needed. Reported  on 12/11/2015  . ezetimibe (ZETIA) 10 MG tablet Take 1 tablet (10 mg total) by mouth daily.  . folic acid (FOLVITE) 1 MG tablet Take 1 mg by mouth daily.  . furosemide (LASIX) 20 MG tablet Take 2 tablets (40 mg total) by mouth daily.  Marland Kitchen lisinopril (PRINIVIL,ZESTRIL) 5 MG tablet Take 1 tablet (5 mg total) by mouth daily.  . methotrexate (RHEUMATREX) 2.5 MG tablet Take 4 tablets (10 mg total) by mouth once a week. Caution:Chemotherapy. Protect from light. Every monday  . mirabegron ER (MYRBETRIQ) 50 MG TB24 tablet Take 1 tablet (50 mg total) by mouth daily.  . pantoprazole (PROTONIX) 40 MG tablet Take 1 tablet (40 mg total) by mouth  daily.  . predniSONE (DELTASONE) 5 MG tablet Take 1.5 tablets (7.5 mg total) by mouth daily.  . sertraline (ZOLOFT) 25 MG tablet Take 1 tablet daily for 7 days, then increase to 2 tablets daily  . triamcinolone cream (KENALOG) 0.1 % Apply to affected area as directed (Patient taking differently: Apply 1 application topically daily as needed (rash). Apply to affected area as directed)   No facility-administered encounter medications on file as of 03/22/2016.      Current Examination:  Behavioral Observations:  Appearance: Neatly and appropriately dressed and groomed, appearing somwhat younger than his chronological age Gait: Ambulated with a cane, moderate unsteadiness Speech: Fluent; normal rate, rhythm and volume Thought process: Linear, goal directed Affect: Mildly blunted, generally euthymic Interpersonal: Pleasant, appropriate Orientation: Oriented to person, place, and month and year. Disoriented to date (one day off) and day of the week. Inaccurately named the current President (reported Tawni Pummel) but accurately named his predecessor.  Tests Administered: . Test of Premorbid Functioning (TOPF) . Wechsler Adult Intelligence Scale-Fourth Edition (WAIS-IV): Similarities, Coding and Digit Span subtests . [employer] Verbal Learning Test - 2nd Edition (CVLT-2) Short Form . Repeatable Battery for the Assessment of Neuropsychological Status (RBANS) Form A:  Figure Copy and Recall subtests, Story Memory and Recall subtests . Neuropsychological Assessment Battery (NAB) Language Module, Form 1: Naming Subtest . [place] Diagnostic Aphasia Examination: Complex Ideational Material subtest . Controlled Oral Word Association Test (COWAT) . Trail Making Test A and B . Clock drawing test . Geriatric Depression Scale (GDS) 15 Item . Generalized Anxiety Disorder - 7 item screener (GAD-7)  Test Results: Note: Standardized scores are presented only for use by appropriately trained professionals and to  allow for any future test-retest comparison. These scores should not be interpreted without consideration of all the information that is contained in the rest of the report. The most recent standardization samples from the test publisher or other sources were used whenever possible to derive standard scores; scores were corrected for age, gender, ethnicity and education when available.   Test Scores:   Test Name Standardized Score Descriptor  TOPF SS= 117 High average  WAIS-IV Subtests    Similarities ss= 12 High average  Coding ss= 6 Low average  Digit Span Forward ss= 11 Average  Digit Span Backward ss= 13 High average  RBANS Subtests    Figure Copy Z= 1.4 Superior  Figure Recall Z= -0.3 Average  Story Memory Z= -2.6 Severely impaired  Story Recall Z= -2.3 Impaired  CVLT-II Scores    Trial 1 Z= -2 Impaired  Trial 4 Z= -2.5 Impaired  Trials 1-4 total T= 27 Impaired  SD Free Recall Z= -2 Impaired  LD Free Recall Z= -1 Low average  LD Cued Recall Z= -1.5 Borderline  Recognition Discriminability (6/9 hits,  3 false positives) Z= -0.5 Average  Forced Choice Recognition Raw= 9/9 WNL  NAB Naming T= 31 Borderline  BDAE Subtest    Complex Ideational Material Raw= 8/12 Impaired  COWAT-FAS T= 42 Low average  COWAT-Animals T= 33 Borderline  Trail Making Test A 0 errors T= 49 Average  Trail Making Test B 5 errors T= 32 Borderline  Clock Drawing  WNL   GDS-15 4/15 WNL   GAD-7 10/21 Moderate      Description of Test Results:  Premorbid verbal intellectual abilities were estimated to have been within the high average range based on a test of word reading. Psychomotor processing speed was low average. Auditory attention and working memory were average to high average, respectively. Visual-spatial construction was superior, representing a clear area of relative strength. Language abilities were below expectation. Specifically, confrontation naming and semantic verbal fluency were both  borderline impaired, and auditory comprehension of complex ideational material was impaired. With regard to verbal memory, encoding and acquisition of non-contextual information (i.e., word list) was impaired (3, 4, 4, 3/9 items recalled across Trials 1-4, respectively). After a brief distracter task, free recall was impaired (2/9 items recalled). After a delay, free recall was low average (1/9 items recalled). With semantic cueing, he recalled one additional word (borderline impaired). Performance on a yes/no recognition task was average overall, although he demonstrated impaired number of hits (6/9). On another verbal memory test, encoding and acquisition of contextual auditory information (i.e., short story) was severely impaired. After a delay, free recall was impaired. With regard to non-verbal memory, delayed free recall of visual information was average, representing a clear strength for non-verbal memory retrieval over verbal memory retrieval. Executive functioning was variable. Mental flexibility and set-shifting were borderline impaired on Trails B; of note, he completed five set-loss errors on this task. Verbal fluency with phonemic search restrictions was low average. Verbal abstract reasoning was high average. Performance on a clock drawing task was generally intact. On a self-report questionnaire of mood, the patient's responses were not  indicative of clinically significant depression at the present time. However, on another measure of anxiety symptoms, he did endorse a moderate level of generalized anxiety. Symptoms endorsed included: nervousness, difficulty relaxing, increased irritability, and, to a lesser extent, excessive worrying, inability to control worrying, restlessness, and fear of the worst happening.   Clinical Impressions: Mild dementia, unspecified, likely multifactorial (cerebrovascular disease, vitamin B12 deficiency, sleep apnea, rule-out left hemisphere stroke/multiple infarcts);  Adjustment disorder with mixed depression and anxiety. The patient's current cognitive profile, interesting, is indicative of more left hemisphere involvement than right, which is surprising given his history of right-hemisphere stroke in 03/2013 and the reported findings from previous neuropsychological evaluation in 06/2014 (these records unfortunately not available for my review). 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 It seems that his visual-spatial functioning has clearly improved, which is surprising given that testing in 06/2014 still apparently showed trouble in this domain (as most improvement is typically seen in the one year post stroke). Meanwhile, a decline in verbal memory and language since his last evaluation is apparent, raising suspicion for more recent left hemisphere involvement. I suspect vascular damage in the left hemisphere over Alzheimer's disease, given that his non-verbal memory is so intact and his overall cognitive profile is so lateralizing. There is also likely some degree of frontal-subcortical dysfunction related to cerebrovascular disease in general. Low vitamin B12 and inadequately treated  sleep apnea are also likely contributing to cognitive dysfunction.  Overall, I would call this dementia based on the level of impairment in verbal memory and semantic retrieval, but he clearly has high cognitive reserve and is able to function relatively well in spite of these deficits. I am hopeful that cognition may be enhanced with vitamin B12 supplementation and more consistent CPAP treatment. The patient is also presenting with adjustment reaction to physical limitations and declining function.   Recommendations/Plan: Based on the findings of the present evaluation, the following recommendations are offered:  1. Compensatory strategies for memory  deficits were reviewed with the patient and his wife. 2. Optimal control of vascular risk factors is necessary to reduce the risk of future stroke and further vascular cognitive impairment. 3. Treatment for sleep apnea with consistent CPAP use is highly recommended. I provided written information to the patient. 4. Vitamin B12 supplementation may also enhance cognitive function although the research is more clear about cognitive benefits of CPAP use. 5. Retesting in 6 mos-1 year after implementing the above recommendations will be useful in tracking cognitive status, assessing any progression or improvement in symptoms and further assisting with treatment planning. 6. The patient recently started sertraline for adjustment related depression/anxiety, and the effectiveness of this should be monitored. 7. The patient was encouraged to participate in activities that involve safe cardiovascular exercise (commended for physical therapy), social interaction and mental stimulation.   Feedback to Patient: Christopher Baker and his wife returned for a feedback appointment on 03/22/2016 to review the results of his neuropsychological evaluation with this provider. 60 minutes face-to-face time was spent reviewing his test results, my impressions and my recommendations as detailed above.    Total time spent on this patient's case: [id number] unit for interview with psychologist; [phone] units of testing by psychometrician under psychologist's supervision; [phone] units for medical record review, scoring of neuropsychological tests, interpretation of test results, preparation of this report, and review of results to the patient by psychologist.      Thank you for your referral of Christopher Baker. Please feel free to contact me if you have any questions or concerns regarding this report.

## 2016-03-22 ENCOUNTER — Ambulatory Visit (INDEPENDENT_AMBULATORY_CARE_PROVIDER_SITE_OTHER): Payer: PPO | Admitting: Psychology

## 2016-03-22 ENCOUNTER — Encounter: Payer: Self-pay | Admitting: Psychology

## 2016-03-22 DIAGNOSIS — I69919 Unspecified symptoms and signs involving cognitive functions following unspecified cerebrovascular disease: Secondary | ICD-10-CM

## 2016-03-22 DIAGNOSIS — I679 Cerebrovascular disease, unspecified: Secondary | ICD-10-CM

## 2016-03-22 DIAGNOSIS — F4323 Adjustment disorder with mixed anxiety and depressed mood: Secondary | ICD-10-CM

## 2016-03-23 ENCOUNTER — Telehealth: Payer: Self-pay | Admitting: Psychology

## 2016-03-23 ENCOUNTER — Ambulatory Visit: Payer: PPO | Attending: Physical Medicine and Rehabilitation | Admitting: Physical Therapy

## 2016-03-23 DIAGNOSIS — R252 Cramp and spasm: Secondary | ICD-10-CM | POA: Insufficient documentation

## 2016-03-23 DIAGNOSIS — R2689 Other abnormalities of gait and mobility: Secondary | ICD-10-CM | POA: Insufficient documentation

## 2016-03-23 DIAGNOSIS — R2681 Unsteadiness on feet: Secondary | ICD-10-CM | POA: Insufficient documentation

## 2016-03-23 DIAGNOSIS — E559 Vitamin D deficiency, unspecified: Secondary | ICD-10-CM | POA: Diagnosis not present

## 2016-03-23 DIAGNOSIS — D509 Iron deficiency anemia, unspecified: Secondary | ICD-10-CM | POA: Diagnosis not present

## 2016-03-23 DIAGNOSIS — M6281 Muscle weakness (generalized): Secondary | ICD-10-CM | POA: Insufficient documentation

## 2016-03-23 DIAGNOSIS — D519 Vitamin B12 deficiency anemia, unspecified: Secondary | ICD-10-CM | POA: Diagnosis not present

## 2016-03-23 DIAGNOSIS — Z6829 Body mass index (BMI) 29.0-29.9, adult: Secondary | ICD-10-CM | POA: Diagnosis not present

## 2016-03-23 NOTE — Telephone Encounter (Signed)
Patient wife Christopher Baker would like a call back from you she has some questions please call her at [phone] thank you

## 2016-03-23 NOTE — Therapy (Signed)
[place], [place], [address] Phone: [phone]   Fax:  [phone]  Physical Therapy Treatment / discharge Note  Patient Details  Name: JOZSEF WESCOAT MRN: [id number] Date of Birth: [birth year] Referring Provider: Suella Broad Md.  Encounter Date: 03/23/2016      PT End of Session - 03/23/16 1433    Visit Number 24   Number of Visits 27   Date for PT Re-Evaluation 03/29/16   Authorization Type [employer]: Kx mod by 15th visit; progress note/ G code by 20th visit   PT Start Time 1343  pt arrived 13 min late   PT Stop Time 1415   PT Time Calculation (min) 32 min   Activity Tolerance Patient tolerated treatment well   Behavior During Therapy [facility] for tasks assessed/performed      Past Medical History:  Diagnosis Date  . Arthritis with psoriasis ([place])   . Borderline diabetes    DIET CONTROLLED - PT STATES HE IS NOT DIABETIC  . Cancer [facility])    prostate  2008  . Carotid stenosis, bilateral PER DR EARLY / DUPLEX  09-09-10     40 - 59%   BILATERALLY--- ASYMPTOMATIC  . Cataract immature LEFT EYE  . Dysrhythmia    a-fib  . GERD (gastroesophageal reflux disease)    CONTROLLED W/ PROTONIX  . History of prostate cancer S/P RADIATION  TX  6 YRS AGO  . HTN (hypertension)   . Hyperlipidemia   . Iron deficiency   . Lumbar spondylosis W/ RADICULOPATHY  . PAF (paroxysmal atrial fibrillation) ([place])   . Restless leg syndrome   . Sleep apnea TESTED YRS AGO-- NO CPAP RX GIVEN   PT STATES HE DOES NOT THINK HE STILL HAS SLEEP APNEA--DOES NOT USE CPAP  . Status post placement of cardiac pacemaker DDD-  06-04-10-- LAST CHECK 09-08-10 IN EPIC   SECONDARY TO SYNCOPY AND BRADYCARDIA  . Stroke [facility]) Oct. 14, 2014   light left hand, balance issues, short term memory loss 2014  . SVT (supraventricular tachycardia) ([place])    S/P ABLATION   2008    Past Surgical History:  Procedure Laterality Date  . BACK  SURGERY    . CARDIAC ELECTROPHYSIOLOGY STUDY AND ABLATION  2008   FOR SVT  . CARDIAC PACEMAKER PLACEMENT  06-04-10   DDD/ AND REMOVAL LOOR RECORDER  . CHOLECYSTECTOMY  2009  . ESOPHAGOGASTRODUODENOSCOPY N/A 12/12/2014   Procedure: ESOPHAGOGASTRODUODENOSCOPY (EGD);  Surgeon: Wilford Corner, MD;  Location: [facility] ENDOSCOPY;  Service: Endoscopy;  Laterality: N/A;  . INGUINAL HERNIA REPAIR  2005   LEFT/   DONE WITH PENILE PROSTESIS SURG.  . JOINT REPLACEMENT    . KNEE ARTHROSCOPY  06/02/2011   Procedure: ARTHROSCOPY KNEE;  Surgeon: Gearlean Alf;  Location: [place];  Service: Orthopedics;  Laterality: Left;  LEFT KNEE ARTHROSCOPY WITH DEBRIDEMENT  . LOOP RECORDER PLACEMENT  01-30-10  . LUMBAR LAMINECTOMY/ DISKECTOMY/ FUSION  05-19-10   L4 - 5  . PENILE PROSTHESIS IMPLANT  1995  . PROSTATE BIOPSY  2007  . REMOVAL AND PLACEMENT PENILE PROSTHESIS IMPLANT   2005   AND LEFT CORPOROPLASTY /    DONE INGUINAL REPAIR  . REPLACEMENT TOTAL KNEE  1997   RIGHT  . SPINE SURGERY    . TOTAL KNEE ARTHROPLASTY  12/20/2011   Procedure: TOTAL KNEE ARTHROPLASTY;  Surgeon: Gearlean Alf, MD;  Location: [facility];  Service: Orthopedics;  Laterality: Left;  . TOTAL  SHOULDER ARTHROPLASTY Right 06/27/2015   Procedure: REVERSE TOTAL SHOULDER ARTHROPLASTY;  Surgeon: Netta Cedars, MD;  Location: [place];  Service: Orthopedics;  Laterality: Right;  . TRANSURETHRAL RESECTION OF PROSTATE  2006    There were no vitals filed for this visit.      Subjective Assessment - 03/23/16 1348    Subjective per pt's wife he has a hx of 3 falls in the last 2-3 weeks, and reports he has had a barrage of testing for blood and neurological testing which could be a series of small TIA's in the L brain.   Currently in Pain? Yes   Pain Score 0-No pain   Pain Location Back   Pain Orientation Right   Pain Descriptors / Indicators Aching   Pain Type Chronic pain   Pain Onset More than a month ago   Pain Frequency  Intermittent   Multiple Pain Sites Yes   Pain Score 2   Pain Location Hip   Pain Orientation Right   Pain Onset More than a month ago   Pain Frequency Intermittent            [facility] PT Assessment - 03/23/16 0001      Observation/Other Assessments   Focus on Therapeutic Outcomes (FOTO)  39% limited     AROM   Lumbar Flexion 70   Lumbar - Right Side Bend 35   Lumbar - Left Side Bend 35     Berg Balance Test   Sit to Stand Able to stand without using hands and stabilize independently   Standing Unsupported Able to stand safely 2 minutes   Sitting with Back Unsupported but Feet Supported on Floor or Stool Able to sit safely and securely 2 minutes   Stand to Sit Sits safely with minimal use of hands   Transfers Able to transfer safely, minor use of hands   Standing Unsupported with Eyes Closed Able to stand 10 seconds safely   Standing Ubsupported with Feet Together Able to place feet together independently and stand 1 minute safely   From Standing, Reach Forward with Outstretched Arm Can reach forward >12 cm safely (5")   From Standing Position, Pick up Object from Floor Able to pick up shoe safely and easily   From Standing Position, Turn to Look Behind Over each Shoulder Looks behind one side only/other side shows less weight shift   Turn 360 Degrees Able to turn 360 degrees safely but slowly   Standing Unsupported, Alternately Place Feet on Step/Stool Able to stand independently and complete 8 steps >20 seconds   Standing Unsupported, One Foot in Front Able to plae foot ahead of the other independently and hold 30 seconds   Standing on One Leg Tries to lift leg/unable to hold 3 seconds but remains standing independently   Total Score 47                               PT Short Term Goals - 03/23/16 1428      PT SHORT TERM GOAL #1   Title pt will be I with inital HEP (01/10/2016)   Time 4   Period Weeks   Status Achieved     PT SHORT TERM GOAL #2    Title pt will be able to verbalize and demonstrate proper posture and lifting and carrying techniques to prevent and reduce low back pain (01/10/2016)   Time 4   Period Weeks   Status  Achieved     PT SHORT TERM GOAL #3   Title pt will improve bil hip abductor/ extensor strength to >/= 4-/5 with >/= 2/10 pain in the low back to promote functional stability (01/10/2016)   Time 4   Period Weeks   Status Achieved     PT SHORT TERM GOAL #4   Title berg balance testing will be performed and goals well be made   Time 2   Period Weeks   Status Achieved     PT SHORT TERM GOAL #5   Title pt will improve trunk mobility by >/= 5 degrees in all planes with </= 2/10 pain and to promote functional progression (01/10/2016)   Time 4   Period Weeks   Status Achieved           PT Long Term Goals - 03/23/16 1428      PT LONG TERM GOAL #1   Title pt will be I with all HEP given as of last visit (02/05/2016)   Time 8   Period Weeks   Status Achieved     PT LONG TERM GOAL #2   Title He will improve trunk flexion and side bending by >/= 10 degrees with </= 1/10 pain </= low guard with no report of feeling like he will fall to promote functional trunk mobility and safety during ADLS (7/56/4332)   Time 8   Period Weeks   Status Achieved     PT LONG TERM GOAL #3   Title pt will improve bil hip strength to >/= 4/5 to for functional endurance with walking/ standing and promote safety ( 02/05/2016)   Time 8   Period Weeks   Status Partially Met     PT LONG TERM GOAL #4   Title pt will be able to walk/ stand for >/= 45 minutes with LRAD with </= 1/10 pain to assist with functional endruance for ADLs and assist with pt's goal of returning to playing golf (02/05/2016)   Time 8   Period Weeks   Status On-going     PT LONG TERM GOAL #5   Title at discharge pt with improve his FOTO score to >/= 72 to demonstrate improvement in function (02/05/2016)   Time 8   Period Weeks   Status Partially Met      PT LONG TERM GOAL #6   Title pt will improve his Berg balance score to >/= 45/56 to promote safety with walking/ standing and help with pt's goal with returning to playing golf (02/05/2016)   Time 8   Period Weeks   Status Achieved               Plan - 03/23/16 1434    Clinical Impression Statement Mr. Fandrich has made progress with physical therapy improving his Berg balance and LE strength as well as trunk mobility. discussed pt progression and that based on function and low back pain he is doing well and that his recent bought of falls may be non-musculoskeletal based. he has met or partially met all goals and plans to come to the group class and will be discharged from PT today.    PT Next Visit Plan discharge   Consulted and Agree with Plan of Care Patient      Patient will benefit from skilled therapeutic intervention in order to improve the following deficits and impairments:  Pain, Improper body mechanics, Postural dysfunction, Decreased balance, Decreased endurance, Decreased activity tolerance, Difficulty walking, Decreased range of motion, Abnormal  gait, Hypomobility, Increased fascial restricitons, Decreased strength, Decreased mobility  Visit Diagnosis: Muscle weakness (generalized)  Cramp and spasm  Other abnormalities of gait and mobility  Unsteadiness on feet       G-Codes - 2016/04/18 1430    Functional Assessment Tool Used FOTO/ clinical judgement   Functional Limitation Mobility: Walking and moving around   Mobility: Walking and Moving Around Goal Status [phone]) At least 1 percent but less than 20 percent impaired, limited or restricted   Mobility: Walking and Moving Around Discharge Status [id number]) At least 20 percent but less than 40 percent impaired, limited or restricted      Problem List Patient Active Problem List   Diagnosis Date Noted  . S/P shoulder replacement 06/27/2015  . Residual cognitive deficit as late effect of stroke 04/15/2015  . Lung  nodule   . Abnormal CXR 12/12/2014  . GI bleed 12/11/2014  . Weakness generalized 12/11/2014  . Leukocytosis 12/11/2014  . Restless leg syndrome 12/11/2014  . Anemia 12/11/2014  . Abnormal EKG 12/11/2014  . Gait disturbance, post-stroke 10/22/2014  . TIA (transient ischemic attack) 06/18/2014  . Partial seizures ([place]) 06/18/2014  . Seizure ([place]) 06/18/2014  . Vitamin B12 deficiency 06/18/2014  . OSA (obstructive sleep apnea) 02/26/2014  . Cognitive deficits as late effect of cerebrovascular disease 01/11/2014  . Fatigue 09/20/2013  . Flu-like symptoms 06/19/2013  . Intermittent lightheadedness 06/19/2013  . Low blood pressure, not hypotension 06/19/2013  . Influenza with respiratory manifestations 06/19/2013  . Embolic cerebral infarction ([place]) 04/09/2013  . CVA (cerebral infarction) 04/03/2013  . Occlusion and stenosis of carotid artery without mention of cerebral infarction 11/21/2012  . OA (osteoarthritis) of knee 12/20/2011  . Methotrexate, long term, current use 09/10/2011  . Knee pain, left 07/27/2011  . Pacemaker-Medtronic 09/08/2010  . UNSPECIFIED VISUAL DISTURBANCE 07/20/2010  . SYNCOPE 06/01/2010  . IRON DEFIC ANEMIA [place] DIET IRON INTAKE 04/20/2010  . PSORIASIS 04/20/2010  . HEADACHE, PERSISTENT 01/13/2010  . HEMATURIA UNSPECIFIED 11/19/2009  . PHLEBITIS AND THROMBOPHLEBITIS OF OTHER SITE 07/02/2009  . CELLULITIS, LEG, LEFT 07/02/2009  . DISUSE OSTEOPOROSIS 05/12/2009  . HYPERGLYCEMIA, BORDERLINE 01/10/2009  . SPONDYLOSIS, LUMBAR, WITH RADICULOPATHY 10/15/2008  . OTHER SPECIFIED DERMATOMYCOSES 06/24/2008  . EXTRINSIC ASTHMA, WITH EXACERBATION 05/01/2008  . CALCU GALLBLADD W/OTH CHOLECYSTITIS&OBSTRUCTION 02/20/2008  . VARICOSE VEINS LOWER EXTREMITIES W/INFLAMMATION 12/25/2007  . EDEMA 12/25/2007  . INSOMNIA WITH SLEEP APNEA UNSPECIFIED 08/31/2007  . GERD 03/23/2007  . FIBRILLATION, ATRIAL 01/31/2007  . PSORIATIC ARTHRITIS 01/31/2007  . ABDOMINAL BLOATING  01/31/2007  . Hyperlipidemia 01/19/2007  . Essential hypertension 01/19/2007  . PROSTATE CANCER, HX OF 01/19/2007   Starr [place] PT, DPT, LAT, ATC  18-Apr-2016  2:39 PM      [facility] [address] [place], [place], [address] Phone: [phone]   Fax:  [phone]  Name: KEELAN TRIPODI MRN: [id number] Date of Birth: 1934/04/14   PHYSICAL THERAPY DISCHARGE SUMMARY  Visits from Start of Care: 24  Current functional level related to goals / functional outcomes: FOTO 39% limited   Remaining deficits: Intermittent L hip soreness and weakness, recent bought of falls and instability which pt may benefit from stability of a rolling walker.    Education / Equipment: HEP, Hotel manager, theraband  Plan: Patient agrees to discharge.  Patient goals were partially met. Patient is being discharged due to meeting the stated rehab goals.  ?????

## 2016-03-23 NOTE — Progress Notes (Signed)
Spoke with patient's wife, [name], via phone today. She noted that the patient has been falling more recently. He has had three falls in the past 2 weeks -- the first was in our office on 9/21, and he had one a few days later and then one yesterday. I advised that I will make Dr. Tomi Likens aware.

## 2016-03-24 ENCOUNTER — Encounter: Payer: Self-pay | Admitting: Neurology

## 2016-03-24 ENCOUNTER — Ambulatory Visit (INDEPENDENT_AMBULATORY_CARE_PROVIDER_SITE_OTHER): Payer: PPO | Admitting: Neurology

## 2016-03-24 VITALS — BP 116/64 | HR 82 | Wt 225.0 lb

## 2016-03-24 DIAGNOSIS — R2681 Unsteadiness on feet: Secondary | ICD-10-CM

## 2016-03-24 DIAGNOSIS — F4323 Adjustment disorder with mixed anxiety and depressed mood: Secondary | ICD-10-CM

## 2016-03-24 DIAGNOSIS — I69919 Unspecified symptoms and signs involving cognitive functions following unspecified cerebrovascular disease: Secondary | ICD-10-CM | POA: Diagnosis not present

## 2016-03-24 DIAGNOSIS — E538 Deficiency of other specified B group vitamins: Secondary | ICD-10-CM

## 2016-03-24 DIAGNOSIS — R296 Repeated falls: Secondary | ICD-10-CM

## 2016-03-24 DIAGNOSIS — R6889 Other general symptoms and signs: Secondary | ICD-10-CM

## 2016-03-24 DIAGNOSIS — I480 Paroxysmal atrial fibrillation: Secondary | ICD-10-CM

## 2016-03-24 MED ORDER — ENDURANCE FOUR LEG SEAT CANE MISC
1.0000 | 0 refills | Status: DC
Start: 2016-03-24 — End: 2017-01-13

## 2016-03-24 NOTE — Procedures (Signed)
ELECTROENCEPHALOGRAM REPORT  Date of Study: 03/17/2016  Patient's Name: Christopher Baker MRN: [id number] Date of Birth: June 21, 1934   Clinical History: 82 year old man with vascular dementia presents for staring spells.  Medications: ELIQUIS LIPITOR [place] KENALOG  Technical Summary: A multichannel digital EEG recording measured by the international 10-20 system with electrodes applied with paste and impedances below 5000 ohms performed as portable with EKG monitoring in an awake and drowsy patient.  Photic stimulation was performed.  The digital EEG was referentially recorded, reformatted, and digitally filtered in a variety of bipolar and referential montages for optimal display.   Description: The patient is awake and drowsy during the recording.  During maximal wakefulness, there is a symmetric, medium voltage 8 Hz posterior dominant rhythm that attenuates with eye opening. This is admixed with a fair amount of diffuse 4-5 Hz theta and 2-3 Hz delta slowing of the waking background.  During drowsiness and sleep, there is an increase in theta slowing of the background.  Vertex waves were seen.  Photic stimulation did not elicit any abnormalities.  There were no epileptiform discharges or electrographic seizures seen.    EKG lead was unremarkable.  Impression: This awake and drowsy EEG is mildly abnormal due to diffuse slowing of the waking background.  Clinical Correlation of the above findings indicates diffuse cerebral dysfunction that is non-specific in etiology and can be seen with hypoxic/ischemic injury, toxic/metabolic encephalopathies, dementia, neurodegenerative disorders, or medication effect.  The absence of epileptiform discharges does not rule out a clinical diagnosis of epilepsy.  Clinical correlation is advised.  Metta Clines, DO

## 2016-03-24 NOTE — Progress Notes (Signed)
Chart forwarded.  

## 2016-03-24 NOTE — Patient Instructions (Addendum)
1.  I will order you a 4 prong cane for walking so you are more steady 2.  Continue Eliquis and cholesterol medication 3.  Continue B12 shots 4.  We will check a carotid doppler 5.  No driving 6.  24 hour ambulatory EEG 7.  Mediterranean diet    Why follow it? Research shows. . Those who follow the Mediterranean diet have a reduced risk of heart disease  . The diet is associated with a reduced incidence of Parkinson's and Alzheimer's diseases . People following the diet may have longer life expectancies and lower rates of chronic diseases  . The Dietary Guidelines for Americans recommends the Mediterranean diet as an eating plan to promote health and prevent disease  What Is the Mediterranean Diet?  . Healthy eating plan based on typical foods and recipes of Mediterranean-style cooking . The diet is primarily a plant based diet; these foods should make up a majority of meals   Starches - Plant based foods should make up a majority of meals - They are an important sources of vitamins, minerals, energy, antioxidants, and fiber - Choose whole grains, foods high in fiber and minimally processed items  - Typical grain sources include wheat, oats, barley, corn, brown rice, bulgar, farro, millet, polenta, couscous  - Various types of beans include chickpeas, lentils, fava beans, black beans, white beans   Fruits  Veggies - Large quantities of antioxidant rich fruits & veggies; 6 or more servings  - Vegetables can be eaten raw or lightly drizzled with oil and cooked  - Vegetables common to the traditional Mediterranean Diet include: artichokes, arugula, beets, broccoli, brussel sprouts, cabbage, carrots, celery, collard greens, cucumbers, eggplant, kale, leeks, lemons, lettuce, mushrooms, okra, onions, peas, peppers, potatoes, pumpkin, radishes, rutabaga, shallots, spinach, sweet potatoes, turnips, zucchini - Fruits common to the Mediterranean Diet include: apples, apricots, avocados, cherries,  clementines, dates, figs, grapefruits, grapes, melons, nectarines, oranges, peaches, pears, pomegranates, strawberries, tangerines  Fats - Replace butter and margarine with healthy oils, such as olive oil, canola oil, and tahini  - Limit nuts to no more than a handful a day  - Nuts include walnuts, almonds, pecans, pistachios, pine nuts  - Limit or avoid candied, honey roasted or heavily salted nuts - Olives are central to the [employer] - can be eaten whole or used in a variety of dishes   Meats Protein - Limiting red meat: no more than a few times a month - When eating red meat: choose lean cuts and keep the portion to the size of deck of cards - Eggs: approx. 0 to 4 times a week  - Fish and lean poultry: at least 2 a week  - Healthy protein sources include, chicken, [place], lean beef, lamb - Increase intake of seafood such as tuna, salmon, trout, mackerel, shrimp, scallops - Avoid or limit high fat processed meats such as sausage and bacon  Dairy - Include moderate amounts of low fat dairy products  - Focus on healthy dairy such as fat free yogurt, skim milk, low or reduced fat cheese - Limit dairy products higher in fat such as whole or 2% milk, cheese, ice cream  Alcohol - Moderate amounts of red wine is ok  - No more than 5 oz daily for women (all ages) and men older than age 50  - No more than 10 oz of wine daily for men younger than 75  Other - Limit sweets and other desserts  - Use herbs and spices  instead of salt to flavor foods  - Herbs and spices common to the traditional Mediterranean Diet include: basil, bay leaves, chives, cloves, cumin, fennel, garlic, lavender, marjoram, mint, oregano, parsley, pepper, rosemary, sage, savory, sumac, tarragon, thyme   It's not just a diet, it's a lifestyle:  . The Mediterranean diet includes lifestyle factors typical of those in the region  . Foods, drinks and meals are best eaten with others and savored . Daily physical activity is  important for overall good health . This could be strenuous exercise like running and aerobics . This could also be more leisurely activities such as walking, housework, yard-work, or taking the stairs . Moderation is the key; a balanced and healthy diet accommodates most foods and drinks . Consider portion sizes and frequency of consumption of certain foods   Meal Ideas & Options:  . Breakfast:  o Whole wheat toast or whole wheat English muffins with peanut butter & hard boiled egg o Steel cut oats topped with apples & cinnamon and skim milk  o Fresh fruit: banana, strawberries, melon, berries, peaches  o Smoothies: strawberries, bananas, greek yogurt, peanut butter o Low fat greek yogurt with blueberries and granola  o Egg white omelet with spinach and mushrooms o Breakfast couscous: whole wheat couscous, apricots, skim milk, cranberries  . Sandwiches:  o Hummus and grilled vegetables (peppers, zucchini, squash) on whole wheat bread   o Grilled chicken on whole wheat pita with lettuce, tomatoes, cucumbers or tzatziki  o Tuna salad on whole wheat bread: tuna salad made with greek yogurt, olives, red peppers, capers, green onions o Garlic rosemary lamb pita: lamb sauted with garlic, rosemary, salt & pepper; add lettuce, cucumber, greek yogurt to pita - flavor with lemon juice and black pepper  . Seafood:  o Mediterranean grilled salmon, seasoned with garlic, basil, parsley, lemon juice and black pepper o Shrimp, lemon, and spinach whole-grain pasta salad made with low fat greek yogurt  o Seared scallops with lemon orzo  o Seared tuna steaks seasoned salt, pepper, coriander topped with tomato mixture of olives, tomatoes, olive oil, minced garlic, parsley, green onions and cappers  . Meats:  o Herbed greek chicken salad with kalamata olives, cucumber, feta  o Red bell peppers stuffed with spinach, bulgur, lean ground beef (or lentils) & topped with feta   o Kebabs: skewers of chicken,  tomatoes, onions, zucchini, squash  o [place] burgers: made with red onions, mint, dill, lemon juice, feta cheese topped with roasted red peppers . Vegetarian o Cucumber salad: cucumbers, artichoke hearts, celery, red onion, feta cheese, tossed in olive oil & lemon juice  o Hummus and whole grain pita points with a greek salad (lettuce, tomato, feta, olives, cucumbers, red onion) o Lentil soup with celery, carrots made with vegetable broth, garlic, salt and pepper  o Tabouli salad: parsley, bulgur, mint, scallions, cucumbers, tomato, radishes, lemon juice, olive oil, salt and pepper. 8.  CT of head 9.  Referral to Behavioral medicine.  Continue sertraline. 10.  Follow up in 6 months.

## 2016-03-24 NOTE — Progress Notes (Addendum)
NEUROLOGY FOLLOW UP OFFICE NOTE  Christopher Baker Justice:[id number]  HISTORY OF PRESENT ILLNESS: Christopher Baker is an 82 year old right-handed man with hypertension, hyperlipidemia, paroxysmal atrial fibrillation, PPM implant for synocpe, and history of prostate cancer status post radiation in 2007, who follows up for vascular dementia and falls.   UPDATE: He underwent neuropsychological testing on 03/15/16, which revealed mild dementia, likely multifactorial (cerebrovascular, OSA, B12 deficiency).  He demonstrated frontal-subcortical involvement.  However, he demonstrated decline in verbal memory and language since his prior evaluation, suggesting left hemispheric involvement rather than right hemispheric involvement.  This appears to be more likely due to cerebrovascular disease rather than Alzheimer's.  He has had 3 recent falls.  He tripped and fell in our waiting room on 03/11/16.  He had another fall a few days later and his last fall was two days ago.  He finished PT yesterday.    He reportedly has brief staring spells, lasting just seconds.  Routine EEG was performed on 03/17/16, which showed some diffuse slowing but no epileptiform discharges.  He was recently found to have low B12 of 200 and was restarted on injections 2 weeks ago.  He has OSA but is noncompliant with the CPAP.   HISTORY: I.  Cardio-embolic stroke He was admitted to the hospital on 04/03/13 with left hand and arm weakness and facial droop.  CT head revealed right basal ganglia and parietal lobe infarcts.  He was found to have atrial fibrillation.  He was started on Xarelto.  He was admitted to [facility] on 06/18/14 for confusion.  Episode lasted about 1 hour.  He was at dinner with his wife and had difficulty ordering from the menu.  His wife noted a left facial droop.  He did not have any slurred speech, focal numbness or weakness of the extremities.  His blood pressure was found to be in the 190s/80s.  CT of head revealed no  abnormality.  Repeated CT of the head the following day showed no acute stroke.  Echo showed mildly decreased EF of 45-50%.  Carotid doppler showed 40% bilateral ICA stenosis.  An EEG was performed, which was negative.  Differential diagnosis included TIA verse seizure and he was started on Keppra 250mg  twice daily.  He was also restarted on lisinopril, which had been discontinued earlier in the month.  Since discharge, he exhibited lethargy and irritability.  Keppra was subsequently discontinued as a TIA was suspected rather than partial seizure.   II. Vascular cognitive impairment Over the last 2.5 years, he has had problems with memory.  He was repeating things and exhibited mental fogginess and lack of motivation.  He was experiencing depression and insomnia as well.  Procedural tasks and executive functioning were intact.  He was found to have a B12 level of 52 and was started on B12 shots.  He and his wife had noticed improvement in his memory.  However, insomnia and depression were still both an issue.  He had been under a lot of stress.  He was started on Celexa for depression and Proson for insomnia.  Last year, he exhibited extreme lethargy, to the point where he was sleeping during the day, which he never previously done.  Sometimes, he was in such a deep sleep, it required repeated yelling and shaking to wake him up.  He seemed more "foggy" again.  One time, he couldn't figure out where to put the ice cream in the refrigerator.  When his wife told him to  put it in the freezer, he was placing it in the vegetable drawer.  He decided to stop the Proson, and he and his wife noticed significant improvement in lethargy.  He did note some improvement in memory since starting B12 supplementation.  Memory seemed worse after the stroke, so cognitive deficits were thought to be also related to stroke.   He had a formal driving evaluation in July 2015.  He was told to drive with someone else in the car for a  couple of months so he can regain confidence.  The only restriction was that he should not drive at night unless it is a well-lit road.  He has since started driving alone sometimes, but only locally and during daylight hours.     He had formal neuropsych testing performed by Dr. Valentina Shaggy in January 2016.  Profile was consistent with outcome of a right hemispheric stroke: deficits in visual processing speed, non-dominant hand fine motor coordination/speed, and visual spatial dysfunction.   III Tremor He notes mild tremor in his hands, noticeable when he holds a utensil or writes.  He first noticed this about a month or so ago.  He has some balance issues due to bilateral knee replacement surgeries.  No family history of dementia or tremor (possibly his mother had tremor).  He takes ropinirole for RLS  IV Leg weakness/gait instability: Over the past year, he has had increased balance problems with low back pain with pain radiating down the right leg.  His legs feel weak.  Pain is worse when standing and walking.  He also notes right groin pain.  He was evaluated by his neurosurgeon and reportedly has arthritis but nothing surgical to be done.  He is followed by orthopedist and pain specialist.  He has had epidural injections as well as injection in the right hip, which has only exacerbated pain.  He is undergoing PT, which is helpful.  PAST MEDICAL HISTORY: Past Medical History:  Diagnosis Date  . Arthritis with psoriasis ([place])   . Borderline diabetes    DIET CONTROLLED - PT STATES HE IS NOT DIABETIC  . Cancer [facility])    prostate  2008  . Carotid stenosis, bilateral PER DR EARLY / DUPLEX  09-09-10     40 - 59%   BILATERALLY--- ASYMPTOMATIC  . Cataract immature LEFT EYE  . Dysrhythmia    a-fib  . GERD (gastroesophageal reflux disease)    CONTROLLED W/ PROTONIX  . History of prostate cancer S/P RADIATION  [place]  6 YRS AGO  . HTN (hypertension)   . Hyperlipidemia   . Iron deficiency   . Lumbar  spondylosis W/ RADICULOPATHY  . PAF (paroxysmal atrial fibrillation) ([place])   . Restless leg syndrome   . Sleep apnea TESTED YRS AGO-- NO CPAP RX GIVEN   PT STATES HE DOES NOT THINK HE STILL HAS SLEEP APNEA--DOES NOT USE CPAP  . Status post placement of cardiac pacemaker DDD-  06-04-10-- LAST CHECK 09-08-10 IN EPIC   SECONDARY TO SYNCOPY AND BRADYCARDIA  . Stroke [facility]) Oct. 14, 2014   light left hand, balance issues, short term memory loss 2014  . SVT (supraventricular tachycardia) ([place])    S/P ABLATION   2008    MEDICATIONS: Current Outpatient Prescriptions on File Prior to Visit  Medication Sig Dispense Refill  . apixaban (ELIQUIS) 5 MG TABS tablet Take 1 tablet (5 mg total) by mouth 2 (two) times daily. 60 tablet 9  . atorvastatin (LIPITOR) 20 MG tablet Take 20  mg by mouth daily.    . clotrimazole-betamethasone (LOTRISONE) cream Apply 1 application topically 2 (two) times daily as needed (psoriasis).    Marland Kitchen docusate sodium (COLACE) 100 MG capsule Take 100 mg by mouth as needed. Reported on 12/11/2015    . ezetimibe (ZETIA) 10 MG tablet Take 1 tablet (10 mg total) by mouth daily. 90 tablet 1  . folic acid (FOLVITE) 1 MG tablet Take 1 mg by mouth daily.    . furosemide (LASIX) 20 MG tablet Take 2 tablets (40 mg total) by mouth daily. 60 tablet 9  . lisinopril (PRINIVIL,ZESTRIL) 5 MG tablet Take 1 tablet (5 mg total) by mouth daily. 90 tablet 2  . methotrexate (RHEUMATREX) 2.5 MG tablet Take 4 tablets (10 mg total) by mouth once a week. Caution:Chemotherapy. Protect from light. Every monday 4 tablet 1  . mirabegron ER (MYRBETRIQ) 50 MG TB24 tablet Take 1 tablet (50 mg total) by mouth daily. 90 tablet 3  . pantoprazole (PROTONIX) 40 MG tablet Take 1 tablet (40 mg total) by mouth daily. 90 tablet 3  . predniSONE (DELTASONE) 5 MG tablet Take 1.5 tablets (7.5 mg total) by mouth daily. 135 tablet 0  . sertraline (ZOLOFT) 25 MG tablet Take 1 tablet daily for 7 days, then increase to 2 tablets  daily 60 tablet 0  . triamcinolone cream (KENALOG) 0.1 % Apply to affected area as directed (Patient taking differently: Apply 1 application topically daily as needed (rash). Apply to affected area as directed) 480 g 0   No current facility-administered medications on file prior to visit.     ALLERGIES: Allergies  Allergen Reactions  . Ciprofloxacin Nausea Only    REACTION: GI upset  . Hydrocodone-Acetaminophen     Hallucinations in higher doses  . Other Other (See Comments)    SCALLOPS--SUDDEN GI ATTACK    FAMILY HISTORY: Family History  Problem Relation Age of Onset  . Stroke Mother   . Early death Father     SOCIAL HISTORY: Social History   Social History  . Marital status: Married    Spouse name: N/A  . Number of children: N/A  . Years of education: N/A   Occupational History  . retired    Social History Main Topics  . Smoking status: Former Smoker    Packs/day: 0.10    Years: 55.00    Types: Cigarettes    Quit date: 12/07/1998  . Smokeless tobacco: Never Used  . Alcohol use 3.0 oz/week    5 Shots of liquor per week     Comment: gin and tonic  . Drug use: No  . Sexual activity: Yes    Partners: Female   Other Topics Concern  . Not on file   Social History Narrative  . No narrative on file    REVIEW OF SYSTEMS: Constitutional: No fevers, chills, or sweats, no generalized fatigue, change in appetite Eyes: No visual changes, double vision, eye pain Ear, nose and throat: No hearing loss, ear pain, nasal congestion, sore throat Cardiovascular: No chest pain, palpitations Respiratory:  No shortness of breath at rest or with exertion, wheezes GastrointestinaI: No nausea, vomiting, diarrhea, abdominal pain, fecal incontinence Genitourinary:  No dysuria, urinary retention or frequency Musculoskeletal:  No neck pain, back pain Integumentary: No rash, pruritus, skin lesions Neurological: as above Psychiatric: No depression, insomnia, anxiety Endocrine: No  palpitations, fatigue, diaphoresis, mood swings, change in appetite, change in weight, increased thirst Hematologic/Lymphatic:  No purpura, petechiae. Allergic/Immunologic: no itchy/runny eyes, nasal congestion, recent  allergic reactions, rashes  PHYSICAL EXAM: Vitals:   03/24/16 0827  BP: 116/64  Pulse: 82   General: No acute distress.  Patient appears well-groomed.  normal body habitus. Head:  Normocephalic/atraumatic Eyes:  Fundi examined but not visualized  IMPRESSION: Dementia, possibly cerebrovascular Cerebrovascular disease:  PAF, HTN, HLD Frequent falls B12 deficiency Staring spells anxiety  PLAN: 1.  Given potential left hemispheric involvement, we will check CT of the brain to look for evidence of stroke or increased burden of cerebrovascular ischemic changes involving the left hemisphere. 2.  B12 supplementation.  I usually start with 1000 mcg daily for 7 days, then 1084mcg weekly for 4 weeks, then monthly for 1 year, followed by oral supplementation. 3.  Appropriate use of CPAP for OSA 4.  Sertraline for adjustment related anxiety and depression 5.  On Eliquis for secondary stroke prevention and statin for hyperlipidemia, as managed by PCP (LDL goal should be less than 70). 6.  Will check 24 hour ambulatory EEG to capture staring spell (they occur daily) 7.  Mediterranean diet 8.   Instructed no driving 9.  Will order a 4-prong walking cane for better support and steadiness. 10.  We discussed starting a cholinesterase inhibitor or Namenda.  There is some modest efficacy but data is inconclusive.  They would like to hold off on starting any other new medications for now while so much is going on. 11.  Follow up in 6 months.  30 minutes spent face to face with patient, 100% spent counseling.   Metta Clines, DO  CC:  Domenick Gong, MD

## 2016-03-30 ENCOUNTER — Ambulatory Visit: Payer: PPO | Admitting: Neurology

## 2016-03-30 DIAGNOSIS — R6889 Other general symptoms and signs: Secondary | ICD-10-CM

## 2016-03-31 ENCOUNTER — Other Ambulatory Visit: Payer: PPO

## 2016-03-31 ENCOUNTER — Ambulatory Visit
Admission: RE | Admit: 2016-03-31 | Discharge: 2016-03-31 | Disposition: A | Discharge status: Home / Self Care | Payer: PPO | Source: Ambulatory Visit | Attending: Neurology | Admitting: Neurology

## 2016-03-31 DIAGNOSIS — R413 Other amnesia: Secondary | ICD-10-CM | POA: Diagnosis not present

## 2016-03-31 DIAGNOSIS — I69919 Unspecified symptoms and signs involving cognitive functions following unspecified cerebrovascular disease: Secondary | ICD-10-CM

## 2016-04-01 ENCOUNTER — Telehealth: Payer: Self-pay | Admitting: Neurology

## 2016-04-01 DIAGNOSIS — I69919 Unspecified symptoms and signs involving cognitive functions following unspecified cerebrovascular disease: Secondary | ICD-10-CM

## 2016-04-01 MED ORDER — DONEPEZIL HCL 5 MG PO TABS: 5.0000 mg | ORAL_TABLET | Freq: Every day | ORAL | 0 refills | Status: DC

## 2016-04-01 NOTE — Addendum Note (Signed)
Addended by: Gerda Diss A on: 04/01/2016 01:26 PM   Modules accepted: Orders

## 2016-04-01 NOTE — Telephone Encounter (Signed)
Attempted to reach [facility] [place] to schedule carotid. Had to leave message with Solmon Ice (Areatha Keas) Main: [phone]. Requested she contact me or patient to schedule. For behavioral health pt will need to call and schedule. They should contact [phone]. Pt was placed in a 9 a.m. Slot for tomorrow morning. Did attempt tor each pt with update. Will call again before the end of the day.

## 2016-04-01 NOTE — Telephone Encounter (Signed)
Pt scheduled for Doppler for tomorrow.

## 2016-04-01 NOTE — Procedures (Signed)
ELECTROENCEPHALOGRAM REPORT  Dates of Recording: 03/30/2016 at 13:00:39 to 03/31/2016 at 13:41:57  Patient's Name: Christopher Baker MRN: [id number] Date of Birth: 1934-03-13  Procedure: 24-hour ambulatory EEG  History: 82 year old man with cerebrovascular disease and dementia presenting with frequent head drops  Medications:  Hamel Winchester  Technical Summary: This is a 24-hour multichannel digital EEG recording measured by the international 10-20 system with electrodes applied with paste and impedances below 5000 ohms performed as portable with EKG monitoring.  The digital EEG was referentially recorded, reformatted, and digitally filtered in a variety of bipolar and referential montages for optimal display.    DESCRIPTION OF RECORDING: During maximal wakefulness, the background activity consisted of a symmetric 8Hz  posterior dominant rhythm which was reactive to eye opening.  There was some admixed 5-6 Hz theta.  There were no epileptiform discharges or focal slowing seen in wakefulness.  During the recording, the patient progresses through wakefulness, drowsiness and Stage I sleep.  Stage II sleep was not appreciated.  Again, there were no epileptiform discharges seen.  Events: Several habitual spells were captured, where he seemed to doze off with head drop, such as at 14:50 on 10/10, 19:48 on 10/10, 09:13 on 10/11, 09:36 on 10/11 and 10:32 on 10/11, lasting between 20-40 seconds.  There were no electrographic seizures seen.  EKG lead was unremarkable.  IMPRESSION: This 24-hour ambulatory EEG study is normal.  Several of the patient's habitual spells were captured with no electrographic correlate, indicating that these spells are not epileptic.  Metta Clines, DO

## 2016-04-01 NOTE — Addendum Note (Signed)
Addended by: Gerda Diss A on: 04/01/2016 02:36 PM   Modules accepted: Orders

## 2016-04-01 NOTE — Telephone Encounter (Signed)
I spoke with Mrs. Alspaugh regarding test results. 1.  EEG showed no seizure activity with his dozing spells. 2.  CT of head showed new stroke in right parietal region, which wouldn't explain the language disorder.  It also shows progressed atrophy since 10/2014.  Therefore, he may have an underlying neurodegenerative disease such as Alzheimer's.  I would like her and her husband to follow up with me ASAP to discuss further, including management.  I would like to see them within the next week (may schedule at noon next Thursday, if need to).  I want to start Aricept and then Namenda.  We also need to follow up on carotid doppler (hasn't been scheduled yet) and they have not heard from [facility] for an appointment.

## 2016-04-02 ENCOUNTER — Ambulatory Visit: Payer: PPO | Admitting: Neurology

## 2016-04-02 ENCOUNTER — Ambulatory Visit (HOSPITAL_COMMUNITY)
Admission: RE | Admit: 2016-04-02 | Discharge: 2016-04-02 | Disposition: A | Discharge status: Home / Self Care | Payer: PPO | Source: Ambulatory Visit | Attending: Cardiology | Admitting: Cardiology

## 2016-04-02 DIAGNOSIS — I69919 Unspecified symptoms and signs involving cognitive functions following unspecified cerebrovascular disease: Secondary | ICD-10-CM | POA: Diagnosis not present

## 2016-04-02 DIAGNOSIS — I6523 Occlusion and stenosis of bilateral carotid arteries: Secondary | ICD-10-CM | POA: Insufficient documentation

## 2016-04-02 NOTE — Progress Notes (Deleted)
Venous duplex negative for DVT. 

## 2016-04-06 DIAGNOSIS — E559 Vitamin D deficiency, unspecified: Secondary | ICD-10-CM | POA: Diagnosis not present

## 2016-04-06 DIAGNOSIS — G4733 Obstructive sleep apnea (adult) (pediatric): Secondary | ICD-10-CM | POA: Diagnosis not present

## 2016-04-06 DIAGNOSIS — D509 Iron deficiency anemia, unspecified: Secondary | ICD-10-CM | POA: Diagnosis not present

## 2016-04-06 DIAGNOSIS — D519 Vitamin B12 deficiency anemia, unspecified: Secondary | ICD-10-CM | POA: Diagnosis not present

## 2016-04-08 ENCOUNTER — Ambulatory Visit (INDEPENDENT_AMBULATORY_CARE_PROVIDER_SITE_OTHER): Payer: PPO | Admitting: Neurology

## 2016-04-08 ENCOUNTER — Encounter: Payer: Self-pay | Admitting: Neurology

## 2016-04-08 VITALS — BP 138/72 | HR 93 | Ht 75.0 in | Wt 222.2 lb

## 2016-04-08 DIAGNOSIS — F0150 Vascular dementia without behavioral disturbance: Secondary | ICD-10-CM

## 2016-04-08 DIAGNOSIS — R6889 Other general symptoms and signs: Secondary | ICD-10-CM | POA: Diagnosis not present

## 2016-04-08 DIAGNOSIS — E538 Deficiency of other specified B group vitamins: Secondary | ICD-10-CM | POA: Diagnosis not present

## 2016-04-08 MED ORDER — DONEPEZIL HCL 5 MG PO TABS: 5.0000 mg | ORAL_TABLET | Freq: Every day | ORAL | 0 refills | Status: DC

## 2016-04-08 NOTE — Progress Notes (Addendum)
NEUROLOGY FOLLOW UP OFFICE NOTE  Christopher Baker [id number]  HISTORY OF PRESENT ILLNESS: Christopher Baker is an 82 year old right-handed man with hypertension, hyperlipidemia, paroxysmal atrial fibrillation, PPM implant for syncope, and history of prostate cancer status post radiation in 2007, who follows up for vascular dementia and falls.  He is accompanied by his wife who supplements history.   UPDATE: 24 hour ambulatory EEG from 03/30/16 to 03/31/16 was performed and demonstrated mild diffuse slowing but otherwise unremarkable.  CT of head was performed on 03/31/16 was personally reviewed. It did not demonstrate a left hemispheric stroke, but it did show progressive atrophy compared to prior study from 10/31/14, and possible subacute or progression of chronic ischemia in the right parietal cortex.  Carotid doppler from 04/02/16 showed stable 40-59% left ICA stenosis but progressed right ICA stenosis at 60-79% (from 40-59% on 06/18/14).  Since starting B12, he feels better and more clear.  HISTORY: I.  Cardio-embolic stroke He was admitted to the hospital on 04/03/13 with left hand and arm weakness and facial droop.  CT head revealed right basal ganglia and parietal lobe infarcts.  He was found to have atrial fibrillation.  He was started on Xarelto.  He was admitted to [facility] on 06/18/14 for confusion.  Episode lasted about 1 hour.  He was at dinner with his wife and had difficulty ordering from the menu.  His wife noted a left facial droop.  He did not have any slurred speech, focal numbness or weakness of the extremities.  His blood pressure was found to be in the 190s/80s.  CT of head revealed no abnormality.  Repeated CT of the head the following day showed no acute stroke.  Echo showed mildly decreased EF of 45-50%.  Carotid doppler showed 40% bilateral ICA stenosis.  An EEG was performed, which was negative.  Differential diagnosis included TIA verse seizure and he was started on Keppra  249m twice daily.  He was also restarted on lisinopril, which had been discontinued earlier in the month.  Since discharge, he exhibited lethargy and irritability.  Keppra was subsequently discontinued as a TIA was suspected rather than partial seizure.   II. Vascular cognitive impairment Over the last 2.5 years, he has had problems with memory.  He was repeating things and exhibited mental fogginess and lack of motivation.  He was experiencing depression and insomnia as well.  Procedural tasks and executive functioning were intact.  He was found to have a B12 level of 52 and was started on B12 shots.  He and his wife had noticed improvement in his memory.  However, insomnia and depression were still both an issue.  He had been under a lot of stress.  He was started on Celexa for depression and Proson for insomnia.  Last year, he exhibited extreme lethargy, to the point where he was sleeping during the day, which he never previously done.  Sometimes, he was in such a deep sleep, it required repeated yelling and shaking to wake him up.  He seemed more "foggy" again.  One time, he couldn't figure out where to put the ice cream in the refrigerator.  When his wife told him to put it in the freezer, he was placing it in the vegetable drawer.  He decided to stop the Proson, and he and his wife noticed significant improvement in lethargy.  He did note some improvement in memory since starting B12 supplementation.  Memory seemed worse after the stroke, so cognitive deficits  were thought to be also related to stroke.   He had a formal driving evaluation in July 2015.  He was told to drive with someone else in the car for a couple of months so he can regain confidence.  The only restriction was that he should not drive at night unless it is a well-lit road.  He has since started driving alone sometimes, but only locally and during daylight hours.     He had formal neuropsych testing performed by Dr. Valentina Shaggy in January  2016.  Profile was consistent with outcome of a right hemispheric stroke: deficits in visual processing speed, non-dominant hand fine motor coordination/speed, and visual spatial dysfunction.  He underwent repeat neuropsychological testing on 03/15/16, which revealed mild dementia, likely multifactorial (cerebrovascular, OSA, B12 deficiency).  He demonstrated frontal-subcortical involvement.  However, he demonstrated decline in verbal memory and language since his prior evaluation, suggesting left hemispheric involvement rather than right hemispheric involvement.  This appears to be more likely due to cerebrovascular disease rather than Alzheimer's.  He reportedly has brief staring spells, lasting just seconds.  Routine EEG was performed on 03/17/16, which showed some diffuse slowing but no epileptiform discharges.   III Tremor He notes mild tremor in his hands, noticeable when he holds a utensil or writes.  He first noticed this about a month or so ago.  He has some balance issues due to bilateral knee replacement surgeries.  No family history of dementia or tremor (possibly his mother had tremor).  He takes ropinirole for RLS   IV Leg weakness/gait instability: Over the past year, he has had increased balance problems with low back pain with pain radiating down the right leg.  His legs feel weak.  Pain is worse when standing and walking.  He also notes right groin pain.  He was evaluated by his neurosurgeon and reportedly has arthritis but nothing surgical to be done.  He is followed by orthopedist and pain specialist.  He has had epidural injections as well as injection in the right hip, which has only exacerbated pain.  He is undergoing PT, which is helpful.  PAST MEDICAL HISTORY: Past Medical History:  Diagnosis Date  . Arthritis with psoriasis ([place])   . Borderline diabetes    DIET CONTROLLED - PT STATES HE IS NOT DIABETIC  . Cancer [facility])    prostate  2008  . Carotid stenosis, bilateral PER DR  EARLY / DUPLEX  09-09-10     40 - 59%   BILATERALLY--- ASYMPTOMATIC  . Cataract immature LEFT EYE  . Dysrhythmia    a-fib  . GERD (gastroesophageal reflux disease)    CONTROLLED W/ PROTONIX  . History of prostate cancer S/P RADIATION  TX  6 YRS AGO  . HTN (hypertension)   . Hyperlipidemia   . Iron deficiency   . Lumbar spondylosis W/ RADICULOPATHY  . PAF (paroxysmal atrial fibrillation) ([place])   . Restless leg syndrome   . Sleep apnea TESTED YRS AGO-- NO CPAP RX GIVEN   PT STATES HE DOES NOT THINK HE STILL HAS SLEEP APNEA--DOES NOT USE CPAP  . Status post placement of cardiac pacemaker DDD-  06-04-10-- LAST CHECK 09-08-10 IN EPIC   SECONDARY TO SYNCOPY AND BRADYCARDIA  . Stroke [facility]) Oct. 14, 2014   light left hand, balance issues, short term memory loss 2014  . SVT (supraventricular tachycardia) ([place])    S/P ABLATION   2008    MEDICATIONS: Current Outpatient Prescriptions on File Prior to Visit  Medication  Sig Dispense Refill  . apixaban (ELIQUIS) 5 MG TABS tablet Take 1 tablet (5 mg total) by mouth 2 (two) times daily. 60 tablet 9  . atorvastatin (LIPITOR) 20 MG tablet Take 20 mg by mouth daily.    . clotrimazole-betamethasone (LOTRISONE) cream Apply 1 application topically 2 (two) times daily as needed (psoriasis).    . Cyanocobalamin (B-12) 1000 MCG/ML KIT Inject as directed.    . docusate sodium (COLACE) 100 MG capsule Take 100 mg by mouth as needed. Reported on 12/11/2015    . ezetimibe (ZETIA) 10 MG tablet Take 1 tablet (10 mg total) by mouth daily. 90 tablet 1  . folic acid (FOLVITE) 1 MG tablet Take 1 mg by mouth daily.    . furosemide (LASIX) 20 MG tablet Take 2 tablets (40 mg total) by mouth daily. 60 tablet 9  . lisinopril (PRINIVIL,ZESTRIL) 5 MG tablet Take 1 tablet (5 mg total) by mouth daily. 90 tablet 2  . methotrexate (RHEUMATREX) 2.5 MG tablet Take 4 tablets (10 mg total) by mouth once a week. Caution:Chemotherapy. Protect from light. Every monday 4 tablet 1  .  mirabegron ER (MYRBETRIQ) 50 MG TB24 tablet Take 1 tablet (50 mg total) by mouth daily. 90 tablet 3  . Misc. Devices (ENDURANCE FOUR LEG SEAT CANE) MISC 1 each by Does not apply route as directed. 1 each 0  . pantoprazole (PROTONIX) 40 MG tablet Take 1 tablet (40 mg total) by mouth daily. 90 tablet 3  . predniSONE (DELTASONE) 5 MG tablet Take 1.5 tablets (7.5 mg total) by mouth daily. 135 tablet 0  . sertraline (ZOLOFT) 25 MG tablet Take 1 tablet daily for 7 days, then increase to 2 tablets daily 60 tablet 0  . triamcinolone cream (KENALOG) 0.1 % Apply to affected area as directed (Patient taking differently: Apply 1 application topically daily as needed (rash). Apply to affected area as directed) 480 g 0   No current facility-administered medications on file prior to visit.     ALLERGIES: Allergies  Allergen Reactions  . Ciprofloxacin Nausea Only    REACTION: GI upset  . Hydrocodone-Acetaminophen     Hallucinations in higher doses  . Other Other (See Comments)    SCALLOPS--SUDDEN GI ATTACK    FAMILY HISTORY: Family History  Problem Relation Age of Onset  . Stroke Mother   . Early death Father     SOCIAL HISTORY: Social History   Social History  . Marital status: Married    Spouse name: N/A  . Number of children: N/A  . Years of education: N/A   Occupational History  . retired    Social History Main Topics  . Smoking status: Former Smoker    Packs/day: 0.10    Years: 55.00    Types: Cigarettes    Quit date: 12/07/1998  . Smokeless tobacco: Never Used  . Alcohol use 3.0 oz/week    5 Shots of liquor per week     Comment: gin and tonic  . Drug use: No  . Sexual activity: Yes    Partners: Female   Other Topics Concern  . Not on file   Social History Narrative  . No narrative on file    REVIEW OF SYSTEMS: Constitutional: No fevers, chills, or sweats, no generalized fatigue, change in appetite Eyes: No visual changes, double vision, eye pain Ear, nose and  throat: No hearing loss, ear pain, nasal congestion, sore throat Cardiovascular: No chest pain, palpitations Respiratory:  No shortness of breath at rest  or with exertion, wheezes GastrointestinaI: No nausea, vomiting, diarrhea, abdominal pain, fecal incontinence Genitourinary:  No dysuria, urinary retention or frequency Musculoskeletal:  No neck pain, back pain Integumentary: No rash, pruritus, skin lesions Neurological: as above Psychiatric: No depression, insomnia, anxiety Endocrine: No palpitations, fatigue, diaphoresis, mood swings, change in appetite, change in weight, increased thirst Hematologic/Lymphatic:  No purpura, petechiae. Allergic/Immunologic: no itchy/runny eyes, nasal congestion, recent allergic reactions, rashes  PHYSICAL EXAM: Vitals:   04/08/16 1206  BP: 138/72  Pulse: 93   General: No acute distress.  Patient appears well-groomed.   Head:  Normocephalic/atraumatic  IMPRESSION: 1.  Vascular dementia (mild), but cannot rule out underlying neurodegenerative disease such as Alzheimer's.  Since there is no corresponding stroke to account for the language impairment on neuropsychological testing, and repeat head CT has shown progression of generalized cerebral atrophy, underlying Alzheimer's disease in addition to vascular dementia, is possible. 2.  Spells of decreased attention.  I do not suspect seizure or TIAs.  May be symptom of dementia or other medical comorbidities. 3.  B12 deficiency  PLAN: We will initiate Aricept, followed by addition of Namenda in 2 months (if tolerating the Aricept). Eliquis Statin therapy Blood pressure control Due to recurrent episodes of brief decreased alertness, advised not to drive. Repeat carotid doppler in one year Follow up in 6 to 9 months.  28 minutes spent face to face with patient, 100% spent counseling, explaining test results and answering questions.  Metta Clines, DO  CC:  Domenick Gong, MD

## 2016-04-08 NOTE — Patient Instructions (Signed)
1.  We will start donepezil (Aricept) 5mg  daily for four weeks.  If you are tolerating the medication, then after four weeks, we will increase the dose to 10mg  daily.  Side effects include nausea, vomiting, diarrhea, vivid dreams, and muscle cramps.  Please call the clinic if you experience any of these symptoms. 2.  Will get a Home Health assessment to evaluate for safety and assess for appropriate walking device. 3.  Since you briefly lose awareness, I would not drive. 4.  Follow up in 6 to 9 months (or sooner if needed)

## 2016-04-08 NOTE — Addendum Note (Signed)
Addended by: Chester Holstein on: 04/08/2016 03:06 PM   Modules accepted: Orders

## 2016-04-13 ENCOUNTER — Ambulatory Visit: Payer: PPO | Admitting: Neurology

## 2016-04-14 DIAGNOSIS — H1132 Conjunctival hemorrhage, left eye: Secondary | ICD-10-CM | POA: Diagnosis not present

## 2016-04-16 ENCOUNTER — Telehealth: Payer: Self-pay | Admitting: Neurology

## 2016-04-16 DIAGNOSIS — E785 Hyperlipidemia, unspecified: Secondary | ICD-10-CM | POA: Diagnosis not present

## 2016-04-16 DIAGNOSIS — R2681 Unsteadiness on feet: Secondary | ICD-10-CM | POA: Diagnosis not present

## 2016-04-16 DIAGNOSIS — F0150 Vascular dementia without behavioral disturbance: Secondary | ICD-10-CM | POA: Diagnosis not present

## 2016-04-16 DIAGNOSIS — L4050 Arthropathic psoriasis, unspecified: Secondary | ICD-10-CM | POA: Diagnosis not present

## 2016-04-16 DIAGNOSIS — Z950 Presence of cardiac pacemaker: Secondary | ICD-10-CM | POA: Diagnosis not present

## 2016-04-16 DIAGNOSIS — I6523 Occlusion and stenosis of bilateral carotid arteries: Secondary | ICD-10-CM | POA: Diagnosis not present

## 2016-04-16 DIAGNOSIS — E538 Deficiency of other specified B group vitamins: Secondary | ICD-10-CM | POA: Diagnosis not present

## 2016-04-16 DIAGNOSIS — I10 Essential (primary) hypertension: Secondary | ICD-10-CM | POA: Diagnosis not present

## 2016-04-16 DIAGNOSIS — I69311 Memory deficit following cerebral infarction: Secondary | ICD-10-CM | POA: Diagnosis not present

## 2016-04-16 DIAGNOSIS — G2581 Restless legs syndrome: Secondary | ICD-10-CM | POA: Diagnosis not present

## 2016-04-16 DIAGNOSIS — I480 Paroxysmal atrial fibrillation: Secondary | ICD-10-CM | POA: Diagnosis not present

## 2016-04-16 DIAGNOSIS — M4726 Other spondylosis with radiculopathy, lumbar region: Secondary | ICD-10-CM | POA: Diagnosis not present

## 2016-04-16 DIAGNOSIS — G4733 Obstructive sleep apnea (adult) (pediatric): Secondary | ICD-10-CM | POA: Diagnosis not present

## 2016-04-16 NOTE — Telephone Encounter (Signed)
Christopher Baker 10/15/1933. A RN called from [place] called to confirm that  Dr. Tomi Likens was  the ordering Physician for him. She said a home health nurse was to go out to his house today. Thanks

## 2016-04-21 DIAGNOSIS — G4733 Obstructive sleep apnea (adult) (pediatric): Secondary | ICD-10-CM | POA: Diagnosis not present

## 2016-04-21 DIAGNOSIS — I6523 Occlusion and stenosis of bilateral carotid arteries: Secondary | ICD-10-CM | POA: Diagnosis not present

## 2016-04-21 DIAGNOSIS — F0150 Vascular dementia without behavioral disturbance: Secondary | ICD-10-CM | POA: Diagnosis not present

## 2016-04-21 DIAGNOSIS — I69311 Memory deficit following cerebral infarction: Secondary | ICD-10-CM | POA: Diagnosis not present

## 2016-04-21 DIAGNOSIS — R2681 Unsteadiness on feet: Secondary | ICD-10-CM | POA: Diagnosis not present

## 2016-04-21 DIAGNOSIS — E785 Hyperlipidemia, unspecified: Secondary | ICD-10-CM | POA: Diagnosis not present

## 2016-04-21 DIAGNOSIS — I480 Paroxysmal atrial fibrillation: Secondary | ICD-10-CM | POA: Diagnosis not present

## 2016-04-21 DIAGNOSIS — L4050 Arthropathic psoriasis, unspecified: Secondary | ICD-10-CM | POA: Diagnosis not present

## 2016-04-21 DIAGNOSIS — G2581 Restless legs syndrome: Secondary | ICD-10-CM | POA: Diagnosis not present

## 2016-04-21 DIAGNOSIS — E538 Deficiency of other specified B group vitamins: Secondary | ICD-10-CM | POA: Diagnosis not present

## 2016-04-21 DIAGNOSIS — I10 Essential (primary) hypertension: Secondary | ICD-10-CM | POA: Diagnosis not present

## 2016-04-21 DIAGNOSIS — Z950 Presence of cardiac pacemaker: Secondary | ICD-10-CM | POA: Diagnosis not present

## 2016-04-21 DIAGNOSIS — M4726 Other spondylosis with radiculopathy, lumbar region: Secondary | ICD-10-CM | POA: Diagnosis not present

## 2016-04-23 ENCOUNTER — Other Ambulatory Visit: Payer: Self-pay | Admitting: Neurology

## 2016-04-23 DIAGNOSIS — M1611 Unilateral primary osteoarthritis, right hip: Secondary | ICD-10-CM | POA: Diagnosis not present

## 2016-04-23 MED ORDER — STEP N REST WALKER MISC: 0 refills | Status: DC

## 2016-04-23 NOTE — Telephone Encounter (Signed)
Home health requested walker for pt due to falls. Walker rx faxed to [facility] (Fax: [phone] [place]: [phone])

## 2016-04-23 NOTE — Addendum Note (Signed)
Addended by: Gerda Diss A on: 04/23/2016 03:29 PM   Modules accepted: Orders

## 2016-04-27 DIAGNOSIS — E538 Deficiency of other specified B group vitamins: Secondary | ICD-10-CM | POA: Diagnosis not present

## 2016-04-27 DIAGNOSIS — G4733 Obstructive sleep apnea (adult) (pediatric): Secondary | ICD-10-CM | POA: Diagnosis not present

## 2016-04-27 DIAGNOSIS — M4726 Other spondylosis with radiculopathy, lumbar region: Secondary | ICD-10-CM | POA: Diagnosis not present

## 2016-04-27 DIAGNOSIS — L4050 Arthropathic psoriasis, unspecified: Secondary | ICD-10-CM | POA: Diagnosis not present

## 2016-04-27 DIAGNOSIS — Z950 Presence of cardiac pacemaker: Secondary | ICD-10-CM | POA: Diagnosis not present

## 2016-04-27 DIAGNOSIS — I69311 Memory deficit following cerebral infarction: Secondary | ICD-10-CM | POA: Diagnosis not present

## 2016-04-27 DIAGNOSIS — I6523 Occlusion and stenosis of bilateral carotid arteries: Secondary | ICD-10-CM | POA: Diagnosis not present

## 2016-04-27 DIAGNOSIS — I480 Paroxysmal atrial fibrillation: Secondary | ICD-10-CM | POA: Diagnosis not present

## 2016-04-27 DIAGNOSIS — G2581 Restless legs syndrome: Secondary | ICD-10-CM | POA: Diagnosis not present

## 2016-04-27 DIAGNOSIS — R2681 Unsteadiness on feet: Secondary | ICD-10-CM | POA: Diagnosis not present

## 2016-04-27 DIAGNOSIS — E785 Hyperlipidemia, unspecified: Secondary | ICD-10-CM | POA: Diagnosis not present

## 2016-04-27 DIAGNOSIS — F0150 Vascular dementia without behavioral disturbance: Secondary | ICD-10-CM | POA: Diagnosis not present

## 2016-04-27 DIAGNOSIS — I10 Essential (primary) hypertension: Secondary | ICD-10-CM | POA: Diagnosis not present

## 2016-04-29 DIAGNOSIS — I6789 Other cerebrovascular disease: Secondary | ICD-10-CM | POA: Diagnosis not present

## 2016-04-29 DIAGNOSIS — M5416 Radiculopathy, lumbar region: Secondary | ICD-10-CM | POA: Diagnosis not present

## 2016-04-29 DIAGNOSIS — R2689 Other abnormalities of gait and mobility: Secondary | ICD-10-CM | POA: Diagnosis not present

## 2016-04-29 DIAGNOSIS — M47816 Spondylosis without myelopathy or radiculopathy, lumbar region: Secondary | ICD-10-CM | POA: Diagnosis not present

## 2016-04-29 DIAGNOSIS — R269 Unspecified abnormalities of gait and mobility: Secondary | ICD-10-CM | POA: Diagnosis not present

## 2016-04-29 DIAGNOSIS — M48061 Spinal stenosis, lumbar region without neurogenic claudication: Secondary | ICD-10-CM | POA: Diagnosis not present

## 2016-05-04 DIAGNOSIS — E538 Deficiency of other specified B group vitamins: Secondary | ICD-10-CM | POA: Diagnosis not present

## 2016-05-04 DIAGNOSIS — I480 Paroxysmal atrial fibrillation: Secondary | ICD-10-CM | POA: Diagnosis not present

## 2016-05-04 DIAGNOSIS — L4050 Arthropathic psoriasis, unspecified: Secondary | ICD-10-CM | POA: Diagnosis not present

## 2016-05-04 DIAGNOSIS — I6523 Occlusion and stenosis of bilateral carotid arteries: Secondary | ICD-10-CM | POA: Diagnosis not present

## 2016-05-04 DIAGNOSIS — R2681 Unsteadiness on feet: Secondary | ICD-10-CM | POA: Diagnosis not present

## 2016-05-04 DIAGNOSIS — I10 Essential (primary) hypertension: Secondary | ICD-10-CM | POA: Diagnosis not present

## 2016-05-04 DIAGNOSIS — F0150 Vascular dementia without behavioral disturbance: Secondary | ICD-10-CM | POA: Diagnosis not present

## 2016-05-04 DIAGNOSIS — E785 Hyperlipidemia, unspecified: Secondary | ICD-10-CM | POA: Diagnosis not present

## 2016-05-04 DIAGNOSIS — G4733 Obstructive sleep apnea (adult) (pediatric): Secondary | ICD-10-CM | POA: Diagnosis not present

## 2016-05-04 DIAGNOSIS — M4726 Other spondylosis with radiculopathy, lumbar region: Secondary | ICD-10-CM | POA: Diagnosis not present

## 2016-05-04 DIAGNOSIS — I69311 Memory deficit following cerebral infarction: Secondary | ICD-10-CM | POA: Diagnosis not present

## 2016-05-04 DIAGNOSIS — Z950 Presence of cardiac pacemaker: Secondary | ICD-10-CM | POA: Diagnosis not present

## 2016-05-04 DIAGNOSIS — G2581 Restless legs syndrome: Secondary | ICD-10-CM | POA: Diagnosis not present

## 2016-05-07 DIAGNOSIS — G4733 Obstructive sleep apnea (adult) (pediatric): Secondary | ICD-10-CM | POA: Diagnosis not present

## 2016-05-10 DIAGNOSIS — E538 Deficiency of other specified B group vitamins: Secondary | ICD-10-CM | POA: Diagnosis not present

## 2016-05-10 DIAGNOSIS — F0150 Vascular dementia without behavioral disturbance: Secondary | ICD-10-CM | POA: Diagnosis not present

## 2016-05-10 DIAGNOSIS — M4726 Other spondylosis with radiculopathy, lumbar region: Secondary | ICD-10-CM | POA: Diagnosis not present

## 2016-05-10 DIAGNOSIS — L4050 Arthropathic psoriasis, unspecified: Secondary | ICD-10-CM | POA: Diagnosis not present

## 2016-05-10 DIAGNOSIS — I6523 Occlusion and stenosis of bilateral carotid arteries: Secondary | ICD-10-CM | POA: Diagnosis not present

## 2016-05-10 DIAGNOSIS — R2681 Unsteadiness on feet: Secondary | ICD-10-CM | POA: Diagnosis not present

## 2016-05-10 DIAGNOSIS — G2581 Restless legs syndrome: Secondary | ICD-10-CM | POA: Diagnosis not present

## 2016-05-10 DIAGNOSIS — I69311 Memory deficit following cerebral infarction: Secondary | ICD-10-CM | POA: Diagnosis not present

## 2016-05-10 DIAGNOSIS — I10 Essential (primary) hypertension: Secondary | ICD-10-CM | POA: Diagnosis not present

## 2016-05-10 DIAGNOSIS — Z950 Presence of cardiac pacemaker: Secondary | ICD-10-CM | POA: Diagnosis not present

## 2016-05-10 DIAGNOSIS — I480 Paroxysmal atrial fibrillation: Secondary | ICD-10-CM | POA: Diagnosis not present

## 2016-05-10 DIAGNOSIS — E785 Hyperlipidemia, unspecified: Secondary | ICD-10-CM | POA: Diagnosis not present

## 2016-05-10 DIAGNOSIS — G4733 Obstructive sleep apnea (adult) (pediatric): Secondary | ICD-10-CM | POA: Diagnosis not present

## 2016-05-11 ENCOUNTER — Other Ambulatory Visit: Payer: Self-pay

## 2016-05-11 MED ORDER — DONEPEZIL HCL 10 MG PO TABS: 10.0000 mg | ORAL_TABLET | Freq: Every day | ORAL | 0 refills | Status: DC

## 2016-05-18 DIAGNOSIS — M5416 Radiculopathy, lumbar region: Secondary | ICD-10-CM | POA: Diagnosis not present

## 2016-06-02 DIAGNOSIS — M2550 Pain in unspecified joint: Secondary | ICD-10-CM | POA: Diagnosis not present

## 2016-06-02 DIAGNOSIS — L4059 Other psoriatic arthropathy: Secondary | ICD-10-CM | POA: Diagnosis not present

## 2016-06-02 DIAGNOSIS — M5136 Other intervertebral disc degeneration, lumbar region: Secondary | ICD-10-CM | POA: Diagnosis not present

## 2016-06-02 DIAGNOSIS — M545 Low back pain: Secondary | ICD-10-CM | POA: Diagnosis not present

## 2016-06-02 DIAGNOSIS — E663 Overweight: Secondary | ICD-10-CM | POA: Diagnosis not present

## 2016-06-02 DIAGNOSIS — Z6828 Body mass index (BMI) 28.0-28.9, adult: Secondary | ICD-10-CM | POA: Diagnosis not present

## 2016-06-04 DIAGNOSIS — R319 Hematuria, unspecified: Secondary | ICD-10-CM | POA: Diagnosis not present

## 2016-06-17 DIAGNOSIS — G4733 Obstructive sleep apnea (adult) (pediatric): Secondary | ICD-10-CM | POA: Diagnosis not present

## 2016-06-23 DIAGNOSIS — R319 Hematuria, unspecified: Secondary | ICD-10-CM | POA: Diagnosis not present

## 2016-06-23 DIAGNOSIS — N390 Urinary tract infection, site not specified: Secondary | ICD-10-CM | POA: Diagnosis not present

## 2016-06-25 ENCOUNTER — Telehealth: Payer: Self-pay | Admitting: Neurology

## 2016-06-25 NOTE — Telephone Encounter (Signed)
[name] called and said he is going through some medication changes and would like a call back/Dawn  CB# [phone] or [phone]

## 2016-06-25 NOTE — Telephone Encounter (Signed)
Spouse states since Aricept was increased to 10mg  on 06/15/16 the patient has been sleeping a lot more during the day. Methotrexate was increased to 6 tabs weekly due to arthritis around the same time also. Spouse wanted to make MD aware and request to know if meds need to be adjusted. Willing to come in for OV if there is a need.

## 2016-06-28 NOTE — Telephone Encounter (Signed)
To see if it is due to the Aricept, have him stop the Aricept.  Contact us in 2 or 3 days and if symptoms resolved, we can start a different but similar medication.

## 2016-06-29 NOTE — Telephone Encounter (Signed)
Called patient. No answer. Will try later.  

## 2016-06-29 NOTE — Telephone Encounter (Signed)
Spoke to spouse. Gave med instructions. Spouse verbalized understanding. States patient was doing so well when he was taking Aricept 5mg . Will c/b in 2-3 days w/ update.

## 2016-07-12 ENCOUNTER — Encounter: Payer: Self-pay | Admitting: Neurology

## 2016-07-15 ENCOUNTER — Encounter (HOSPITAL_COMMUNITY): Payer: Self-pay

## 2016-07-15 ENCOUNTER — Emergency Department (HOSPITAL_COMMUNITY)
Admission: EM | Admit: 2016-07-15 | Discharge: 2016-07-15 | Disposition: A | Discharge status: Home / Self Care | Payer: PPO | Attending: Emergency Medicine | Admitting: Emergency Medicine

## 2016-07-15 DIAGNOSIS — Z8546 Personal history of malignant neoplasm of prostate: Secondary | ICD-10-CM | POA: Diagnosis not present

## 2016-07-15 DIAGNOSIS — Y929 Unspecified place or not applicable: Secondary | ICD-10-CM | POA: Insufficient documentation

## 2016-07-15 DIAGNOSIS — Z79899 Other long term (current) drug therapy: Secondary | ICD-10-CM | POA: Diagnosis not present

## 2016-07-15 DIAGNOSIS — Y999 Unspecified external cause status: Secondary | ICD-10-CM | POA: Insufficient documentation

## 2016-07-15 DIAGNOSIS — S81812A Laceration without foreign body, left lower leg, initial encounter: Secondary | ICD-10-CM | POA: Diagnosis not present

## 2016-07-15 DIAGNOSIS — I10 Essential (primary) hypertension: Secondary | ICD-10-CM | POA: Diagnosis not present

## 2016-07-15 DIAGNOSIS — W231XXA Caught, crushed, jammed, or pinched between stationary objects, initial encounter: Secondary | ICD-10-CM | POA: Diagnosis not present

## 2016-07-15 DIAGNOSIS — Z96611 Presence of right artificial shoulder joint: Secondary | ICD-10-CM | POA: Diagnosis not present

## 2016-07-15 DIAGNOSIS — Z950 Presence of cardiac pacemaker: Secondary | ICD-10-CM | POA: Insufficient documentation

## 2016-07-15 DIAGNOSIS — Z96653 Presence of artificial knee joint, bilateral: Secondary | ICD-10-CM | POA: Insufficient documentation

## 2016-07-15 DIAGNOSIS — Z87891 Personal history of nicotine dependence: Secondary | ICD-10-CM | POA: Insufficient documentation

## 2016-07-15 DIAGNOSIS — Z8673 Personal history of transient ischemic attack (TIA), and cerebral infarction without residual deficits: Secondary | ICD-10-CM | POA: Diagnosis not present

## 2016-07-15 DIAGNOSIS — Y939 Activity, unspecified: Secondary | ICD-10-CM | POA: Insufficient documentation

## 2016-07-15 MED ORDER — LIDOCAINE-EPINEPHRINE-TETRACAINE (LET) SOLUTION
3.0000 mL | Freq: Once | NASAL | Status: AC
  Administered 2016-07-15: 3 mL via TOPICAL
  Filled 2016-07-15: qty 3

## 2016-07-15 NOTE — ED Triage Notes (Signed)
Patient here with leg laceration after cutting same on car door. Bleeding controlled with dressing, minimal to n o bleeding

## 2016-07-15 NOTE — ED Provider Notes (Signed)
[facility] DEPT Provider Note   CSN: [id number] Arrival date & time: 07/15/16  1209  By signing my name below, I, Christopher Baker, attest that this documentation has been prepared under the direction and in the presence of Christopher Moras, PA-C . Electronically Signed: Higinio Baker, Scribe. 07/15/2016. 1:02 PM.  History   Chief Complaint Chief Complaint  Patient presents with  . leg laceration/ cut on car door   The history is provided by the patient. No language interpreter was used.   HPI Comments: Christopher Baker is a 83 y.o. male with PMHx of stroke, SVT and prostate cancer, who presents to the Emergency Department complaining of sudden onset, laceration to his left leg that occurred at ~11:00 AM this morning. Pt reports he was "getting out of a high truck" this morning when his door suddenly shut, causing a laceration on his left lateral calf. He states he wrapped the area immediately after to stop the bleeding and did not wash his wound out. He notes he is currently taking Eliquis for a prior stroke. Pt states his last tetanus immunization was in 8/17. He denies any other complaints.   Past Medical History:  Diagnosis Date  . Arthritis with psoriasis ([place])   . Borderline diabetes    DIET CONTROLLED - PT STATES HE IS NOT DIABETIC  . Cancer [facility])    prostate  2008  . Carotid stenosis, bilateral PER DR EARLY / DUPLEX  09-09-10     40 - 59%   BILATERALLY--- ASYMPTOMATIC  . Cataract immature LEFT EYE  . Dysrhythmia    a-fib  . GERD (gastroesophageal reflux disease)    CONTROLLED W/ PROTONIX  . History of prostate cancer S/P RADIATION  TX  6 YRS AGO  . HTN (hypertension)   . Hyperlipidemia   . Iron deficiency   . Lumbar spondylosis W/ RADICULOPATHY  . PAF (paroxysmal atrial fibrillation) ([place])   . Restless leg syndrome   . Sleep apnea TESTED YRS AGO-- NO CPAP RX GIVEN   PT STATES HE DOES NOT THINK HE STILL HAS SLEEP APNEA--DOES NOT USE CPAP  . Status post placement of cardiac  pacemaker DDD-  06-04-10-- LAST CHECK 09-08-10 IN EPIC   SECONDARY TO SYNCOPY AND BRADYCARDIA  . Stroke [facility]) Oct. 14, 2014   light left hand, balance issues, short term memory loss 2014  . SVT (supraventricular tachycardia) ([place])    S/P ABLATION   2008    Patient Active Problem List   Diagnosis Date Noted  . S/P shoulder replacement 06/27/2015  . Residual cognitive deficit as late effect of stroke 04/15/2015  . Lung nodule   . Abnormal CXR 12/12/2014  . GI bleed 12/11/2014  . Weakness generalized 12/11/2014  . Leukocytosis 12/11/2014  . Restless leg syndrome 12/11/2014  . Anemia 12/11/2014  . Abnormal EKG 12/11/2014  . Gait disturbance, post-stroke 10/22/2014  . TIA (transient ischemic attack) 06/18/2014  . Partial seizures ([place]) 06/18/2014  . Seizure ([place]) 06/18/2014  . Vitamin B12 deficiency 06/18/2014  . OSA (obstructive sleep apnea) 02/26/2014  . Cognitive deficits as late effect of cerebrovascular disease 01/11/2014  . Fatigue 09/20/2013  . Flu-like symptoms 06/19/2013  . Intermittent lightheadedness 06/19/2013  . Low blood pressure, not hypotension 06/19/2013  . Influenza with respiratory manifestations 06/19/2013  . Embolic cerebral infarction ([place]) 04/09/2013  . CVA (cerebral infarction) 04/03/2013  . Occlusion and stenosis of carotid artery without mention of cerebral infarction 11/21/2012  . OA (osteoarthritis) of knee 12/20/2011  . Methotrexate, long  term, current use 09/10/2011  . Knee pain, left 07/27/2011  . Pacemaker-[employer]tronic 09/08/2010  . UNSPECIFIED VISUAL DISTURBANCE 07/20/2010  . SYNCOPE 06/01/2010  . IRON DEFIC ANEMIA [place] DIET IRON INTAKE 04/20/2010  . PSORIASIS 04/20/2010  . HEADACHE, PERSISTENT 01/13/2010  . HEMATURIA UNSPECIFIED 11/19/2009  . PHLEBITIS AND THROMBOPHLEBITIS OF OTHER SITE 07/02/2009  . CELLULITIS, LEG, LEFT 07/02/2009  . DISUSE OSTEOPOROSIS 05/12/2009  . HYPERGLYCEMIA, BORDERLINE 01/10/2009  . SPONDYLOSIS, LUMBAR, WITH  RADICULOPATHY 10/15/2008  . OTHER SPECIFIED DERMATOMYCOSES 06/24/2008  . EXTRINSIC ASTHMA, WITH EXACERBATION 05/01/2008  . CALCU GALLBLADD W/OTH CHOLECYSTITIS&OBSTRUCTION 02/20/2008  . VARICOSE VEINS LOWER EXTREMITIES W/INFLAMMATION 12/25/2007  . EDEMA 12/25/2007  . INSOMNIA WITH SLEEP APNEA UNSPECIFIED 08/31/2007  . GERD 03/23/2007  . FIBRILLATION, ATRIAL 01/31/2007  . PSORIATIC ARTHRITIS 01/31/2007  . ABDOMINAL BLOATING 01/31/2007  . Hyperlipidemia 01/19/2007  . Essential hypertension 01/19/2007  . PROSTATE CANCER, HX OF 01/19/2007    Past Surgical History:  Procedure Laterality Date  . BACK SURGERY    . CARDIAC ELECTROPHYSIOLOGY STUDY AND ABLATION  2008   FOR SVT  . CARDIAC PACEMAKER PLACEMENT  06-04-10   DDD/ AND REMOVAL LOOR RECORDER  . CHOLECYSTECTOMY  2009  . ESOPHAGOGASTRODUODENOSCOPY N/A 12/12/2014   Procedure: ESOPHAGOGASTRODUODENOSCOPY (EGD);  Surgeon: Wilford Corner, MD;  Location[facility] ENDOSCOPY;  Service: Endoscopy;  Laterality: N/A;  . INGUINAL HERNIA REPAIR  2005   LEFT/   DONE WITH PENILE PROSTESIS SURG.  . JOINT REPLACEMENT    . KNEE ARTHROSCOPY  06/02/2011   Procedure: ARTHROSCOPY KNEE;  Surgeon: Gearlean Alf;  Location: [facility];  Service: Orthopedics;  Laterality: Left;  LEFT KNEE ARTHROSCOPY WITH DEBRIDEMENT  . LOOP RECORDER PLACEMENT  01-30-10  . LUMBAR LAMINECTOMY/ DISKECTOMY/ FUSION  05-19-10   L4 - 5  . PENILE PROSTHESIS IMPLANT  1995  . PROSTATE BIOPSY  2007  . REMOVAL AND PLACEMENT PENILE PROSTHESIS IMPLANT   2005   AND LEFT CORPOROPLASTY /    DONE INGUINAL REPAIR  . REPLACEMENT TOTAL KNEE  1997   RIGHT  . SPINE SURGERY    . TOTAL KNEE ARTHROPLASTY  12/20/2011   Procedure: TOTAL KNEE ARTHROPLASTY;  Surgeon: Gearlean Alf, MD;  Location: [facility] ORS;  Service: Orthopedics;  Laterality: Left;  . TOTAL SHOULDER ARTHROPLASTY Right 06/27/2015   Procedure: REVERSE TOTAL SHOULDER ARTHROPLASTY;  Surgeon: Netta Cedars, MD;  Location: [facility];   Service: Orthopedics;  Laterality: Right;  . TRANSURETHRAL RESECTION OF PROSTATE  2006    Home Medications    Prior to Admission medications   Medication Sig Start Date End Date Taking? Authorizing Provider  apixaban (ELIQUIS) 5 MG TABS tablet Take 1 tablet (5 mg total) by mouth 2 (two) times daily. 02/26/16   Evans Lance, MD  atorvastatin (LIPITOR) 20 MG tablet Take 20 mg by mouth daily.    Historical Provider, MD  clotrimazole-betamethasone (LOTRISONE) cream Apply 1 application topically 2 (two) times daily as needed (psoriasis).    Historical Provider, MD  Cyanocobalamin (B-12) 1000 MCG/ML KIT Inject as directed.    Historical Provider, MD  docusate sodium (COLACE) 100 MG capsule Take 100 mg by mouth as needed. Reported on 12/11/2015    Historical Provider, MD  donepezil (ARICEPT) 10 MG tablet Take 1 tablet (10 mg total) by mouth at bedtime. 05/11/16   Pieter Partridge, DO  ezetimibe (ZETIA) 10 MG tablet Take 1 tablet (10 mg total) by mouth daily. 03/28/15   Kennyth Arnold, FNP  folic acid (FOLVITE)  1 MG tablet Take 1 mg by mouth daily.    Historical Provider, MD  furosemide (LASIX) 20 MG tablet Take 2 tablets (40 mg total) by mouth daily. 02/13/16   Evans Lance, MD  lisinopril (PRINIVIL,ZESTRIL) 5 MG tablet Take 1 tablet (5 mg total) by mouth daily. 02/03/16   Evans Lance, MD  methotrexate (RHEUMATREX) 2.5 MG tablet Take 4 tablets (10 mg total) by mouth once a week. Caution:Chemotherapy. Protect from light. Every monday 07/05/14   Kennyth Arnold, FNP  mirabegron ER (MYRBETRIQ) 50 MG TB24 tablet Take 1 tablet (50 mg total) by mouth daily. 06/29/13   Ricard Dillon, MD  Misc. Devices (ENDURANCE FOUR LEG SEAT CANE) MISC 1 each by Does not apply route as directed. 03/24/16   Pieter Partridge, DO  Misc. Devices (STEP N REST WALKER) MISC Use as directed 04/23/16   Pieter Partridge, DO  pantoprazole (PROTONIX) 40 MG tablet Take 1 tablet (40 mg total) by mouth daily. 10/22/15   Kennyth Arnold, FNP  predniSONE  (DELTASONE) 5 MG tablet Take 1.5 tablets (7.5 mg total) by mouth daily. 05/02/15   Kennyth Arnold, FNP  sertraline (ZOLOFT) 25 MG tablet Take 2 tablets (50 mg total) by mouth daily. 04/23/16   Pieter Partridge, DO  triamcinolone cream (KENALOG) 0.1 % Apply to affected area as directed Patient taking differently: Apply 1 application topically daily as needed (rash). Apply to affected area as directed 04/03/14   Kennyth Arnold, FNP    Family History Family History  Problem Relation Age of Onset  . Stroke Mother   . Early death Father     Social History Social History  Substance Use Topics  . Smoking status: Former Smoker    Packs/day: 0.10    Years: 55.00    Types: Cigarettes    Quit date: 12/07/1998  . Smokeless tobacco: Never Used  . Alcohol use 3.0 oz/week    5 Shots of liquor per week     Comment: gin and tonic     Allergies   Ciprofloxacin; Hydrocodone-acetaminophen; and Other   Review of Systems Review of Systems  Constitutional: Negative for fever.  Skin: Positive for wound.   Physical Exam Updated Vital Signs BP 128/67 (BP Location: Left Arm)   Pulse 68   Temp 98.1 F (36.7 C) (Oral)   Resp 18   SpO2 99%   Physical Exam  Constitutional: He is oriented to person, place, and time. He appears well-developed and well-nourished.  HENT:  Head: Normocephalic.  Eyes: EOM are normal.  Neck: Normal range of motion.  Pulmonary/Chest: Effort normal.  Abdominal: He exhibits no distension.  Musculoskeletal: Normal range of motion.  Neurological: He is alert and oriented to person, place, and time.  Skin:  LLE lateral calf: There is a V shaped 3 cm superficial skin tear without any foreign object noted. Not actively bleeding.   Psychiatric: He has a normal mood and affect.  Nursing note and vitals reviewed.  ED Treatments / Results  DIAGNOSTIC STUDIES:  Oxygen Saturation is 99% on RA, normal by my interpretation.    COORDINATION OF CARE:  1:00 PM Discussed  treatment Baker with pt at bedside and pt agreed to Baker.  Labs (all labs ordered are listed, but only abnormal results are displayed) Labs Reviewed - No data to display  EKG  EKG Interpretation None       Radiology No results found.  Procedures Procedures (including critical care time)  LACERATION REPAIR Performed by: Christopher Baker Authorized byDomenic Baker Consent: Verbal consent obtained. Risks and benefits: risks, benefits and alternatives were discussed Consent given by: patient Patient identity confirmed: provided demographic data Prepped and Draped in normal sterile fashion Wound explored  Laceration Location: L lateral calf  Laceration Length: 3cm  No Foreign Bodies seen or palpated  Anesthesia: local infiltration  Local anesthetic: LET   Anesthetic total: 3 ml  Irrigation method: syringe Amount of cleaning: standard  Skin closure: dermabond/sterile strip  Number of sutures: sterile strip  Technique: sterile strip  Patient tolerance: Patient tolerated the procedure well with no immediate complications.   Medications Ordered in ED Medications - No data to display  Initial Impression / Assessment and Baker / ED Course  I have reviewed the triage vital signs and the nursing notes.  Pertinent labs & imaging results that were available during my care of the patient were reviewed by me and considered in my medical decision making (see chart for details).     BP 128/67 (BP Location: Left Arm)   Pulse 68   Temp 98.1 F (36.7 C) (Oral)   Resp 18   SpO2 99%    I personally performed the services described in this documentation, which was scribed in my presence. The recorded information has been reviewed and is accurate.     Final Clinical Impressions(s) / ED Diagnoses   Final diagnoses:  Noninfected skin tear of left lower extremity, initial encounter    New Prescriptions New Prescriptions   No medications on file     Christopher Moras,  PA-C 07/15/16 [place], MD 07/15/16 1433

## 2016-07-15 NOTE — ED Notes (Signed)
Pt is in stable condition upon d/c and is escorted from ED via wheelchair. 

## 2016-07-20 DIAGNOSIS — G4733 Obstructive sleep apnea (adult) (pediatric): Secondary | ICD-10-CM | POA: Diagnosis not present

## 2016-07-20 DIAGNOSIS — M533 Sacrococcygeal disorders, not elsewhere classified: Secondary | ICD-10-CM | POA: Diagnosis not present

## 2016-07-21 DIAGNOSIS — Z87442 Personal history of urinary calculi: Secondary | ICD-10-CM | POA: Diagnosis not present

## 2016-07-21 DIAGNOSIS — N3040 Irradiation cystitis without hematuria: Secondary | ICD-10-CM | POA: Diagnosis not present

## 2016-07-21 DIAGNOSIS — R310 Gross hematuria: Secondary | ICD-10-CM | POA: Diagnosis not present

## 2016-07-21 DIAGNOSIS — N281 Cyst of kidney, acquired: Secondary | ICD-10-CM | POA: Diagnosis not present

## 2016-07-21 DIAGNOSIS — R109 Unspecified abdominal pain: Secondary | ICD-10-CM | POA: Diagnosis not present

## 2016-07-21 DIAGNOSIS — N390 Urinary tract infection, site not specified: Secondary | ICD-10-CM | POA: Diagnosis not present

## 2016-07-23 ENCOUNTER — Telehealth: Payer: Self-pay | Admitting: Neurology

## 2016-07-23 MED ORDER — DONEPEZIL HCL 10 MG PO TABS: 10.0000 mg | ORAL_TABLET | Freq: Every day | ORAL | 3 refills | Status: DC

## 2016-07-23 MED ORDER — DONEPEZIL HCL 5 MG PO TABS: 5.0000 mg | ORAL_TABLET | Freq: Every day | ORAL | 0 refills | Status: DC

## 2016-07-23 NOTE — Telephone Encounter (Signed)
Received note from after hours clinic that patient's wife called and she wanted patient back on Aricept. They had d/c medication due to sleepiness. Patient had also been on tramadol at the same time.  Per Dr. Tomi Likens okay to restart Aricept 5 mg for one month then increase back to Aricept 10 mg tablets. Wife is okay with this plan. RX sent to pharmacy. They will keep their appt in April as scheduled.

## 2016-07-26 ENCOUNTER — Other Ambulatory Visit: Payer: Self-pay | Admitting: Neurology

## 2016-08-04 DIAGNOSIS — N281 Cyst of kidney, acquired: Secondary | ICD-10-CM | POA: Diagnosis not present

## 2016-08-05 ENCOUNTER — Telehealth: Payer: Self-pay | Admitting: Internal Medicine

## 2016-08-05 DIAGNOSIS — M961 Postlaminectomy syndrome, not elsewhere classified: Secondary | ICD-10-CM | POA: Diagnosis not present

## 2016-08-05 DIAGNOSIS — M5416 Radiculopathy, lumbar region: Secondary | ICD-10-CM | POA: Diagnosis not present

## 2016-08-05 DIAGNOSIS — M47816 Spondylosis without myelopathy or radiculopathy, lumbar region: Secondary | ICD-10-CM | POA: Diagnosis not present

## 2016-08-05 NOTE — Telephone Encounter (Signed)
New Message   Dr Nelva Bush @ [facility] [phone] told them to call you about okaying procedure.  I explained we are not allowed to take surgical medical clearance.  And will a neuro stimulation unit effect his pacemaker?

## 2016-08-06 NOTE — Telephone Encounter (Signed)
Called, pt unavailable. Left voice message to call back. Informed I tried to reach [place] today, but could not reach anyone. Informed I will forward pacemaker question to [facility].

## 2016-08-10 NOTE — Telephone Encounter (Signed)
Spoke to wife regarding patient's neuro stimulator. I informed her that [employer]T recommends that a representative be present the first time the stimulator is used to make sure there is no interference. I told her that once she has a date she could call back and we would make sure that industry is present. Wife voiced understanding. She said that patient has an appt on 3/1 and she will call back with a date at that time.

## 2016-08-19 DIAGNOSIS — N3289 Other specified disorders of bladder: Secondary | ICD-10-CM | POA: Diagnosis not present

## 2016-08-19 DIAGNOSIS — R350 Frequency of micturition: Secondary | ICD-10-CM | POA: Diagnosis not present

## 2016-08-19 DIAGNOSIS — F0280 Dementia in other diseases classified elsewhere without behavioral disturbance: Secondary | ICD-10-CM | POA: Diagnosis not present

## 2016-08-19 DIAGNOSIS — Z8546 Personal history of malignant neoplasm of prostate: Secondary | ICD-10-CM | POA: Diagnosis not present

## 2016-08-19 DIAGNOSIS — G4733 Obstructive sleep apnea (adult) (pediatric): Secondary | ICD-10-CM | POA: Diagnosis not present

## 2016-08-20 DIAGNOSIS — I10 Essential (primary) hypertension: Secondary | ICD-10-CM | POA: Diagnosis not present

## 2016-08-20 DIAGNOSIS — Z125 Encounter for screening for malignant neoplasm of prostate: Secondary | ICD-10-CM | POA: Diagnosis not present

## 2016-08-20 DIAGNOSIS — E559 Vitamin D deficiency, unspecified: Secondary | ICD-10-CM | POA: Diagnosis not present

## 2016-08-20 DIAGNOSIS — D519 Vitamin B12 deficiency anemia, unspecified: Secondary | ICD-10-CM | POA: Diagnosis not present

## 2016-08-20 DIAGNOSIS — E7800 Pure hypercholesterolemia, unspecified: Secondary | ICD-10-CM | POA: Diagnosis not present

## 2016-08-23 ENCOUNTER — Other Ambulatory Visit: Payer: Self-pay | Admitting: Neurology

## 2016-08-27 DIAGNOSIS — G250 Essential tremor: Secondary | ICD-10-CM | POA: Diagnosis not present

## 2016-08-27 DIAGNOSIS — G4733 Obstructive sleep apnea (adult) (pediatric): Secondary | ICD-10-CM | POA: Diagnosis not present

## 2016-08-27 DIAGNOSIS — L4059 Other psoriatic arthropathy: Secondary | ICD-10-CM | POA: Diagnosis not present

## 2016-08-27 DIAGNOSIS — Z7901 Long term (current) use of anticoagulants: Secondary | ICD-10-CM | POA: Diagnosis not present

## 2016-08-27 DIAGNOSIS — I6990 Unspecified sequelae of unspecified cerebrovascular disease: Secondary | ICD-10-CM | POA: Diagnosis not present

## 2016-08-27 DIAGNOSIS — Z6829 Body mass index (BMI) 29.0-29.9, adult: Secondary | ICD-10-CM | POA: Diagnosis not present

## 2016-08-27 DIAGNOSIS — F321 Major depressive disorder, single episode, moderate: Secondary | ICD-10-CM | POA: Diagnosis not present

## 2016-08-27 DIAGNOSIS — I69959 Hemiplegia and hemiparesis following unspecified cerebrovascular disease affecting unspecified side: Secondary | ICD-10-CM | POA: Diagnosis not present

## 2016-08-27 DIAGNOSIS — I480 Paroxysmal atrial fibrillation: Secondary | ICD-10-CM | POA: Diagnosis not present

## 2016-08-27 DIAGNOSIS — D692 Other nonthrombocytopenic purpura: Secondary | ICD-10-CM | POA: Diagnosis not present

## 2016-08-27 DIAGNOSIS — E440 Moderate protein-calorie malnutrition: Secondary | ICD-10-CM | POA: Diagnosis not present

## 2016-08-27 DIAGNOSIS — Z0000 Encounter for general adult medical examination without abnormal findings: Secondary | ICD-10-CM | POA: Diagnosis not present

## 2016-09-06 DIAGNOSIS — Z8546 Personal history of malignant neoplasm of prostate: Secondary | ICD-10-CM | POA: Diagnosis not present

## 2016-09-06 DIAGNOSIS — N281 Cyst of kidney, acquired: Secondary | ICD-10-CM | POA: Diagnosis not present

## 2016-09-06 DIAGNOSIS — R310 Gross hematuria: Secondary | ICD-10-CM | POA: Diagnosis not present

## 2016-09-06 DIAGNOSIS — R911 Solitary pulmonary nodule: Secondary | ICD-10-CM | POA: Diagnosis not present

## 2016-09-06 DIAGNOSIS — N200 Calculus of kidney: Secondary | ICD-10-CM | POA: Diagnosis not present

## 2016-09-07 DIAGNOSIS — E663 Overweight: Secondary | ICD-10-CM | POA: Diagnosis not present

## 2016-09-07 DIAGNOSIS — M2550 Pain in unspecified joint: Secondary | ICD-10-CM | POA: Diagnosis not present

## 2016-09-07 DIAGNOSIS — M545 Low back pain: Secondary | ICD-10-CM | POA: Diagnosis not present

## 2016-09-07 DIAGNOSIS — M5136 Other intervertebral disc degeneration, lumbar region: Secondary | ICD-10-CM | POA: Diagnosis not present

## 2016-09-07 DIAGNOSIS — Z6828 Body mass index (BMI) 28.0-28.9, adult: Secondary | ICD-10-CM | POA: Diagnosis not present

## 2016-09-07 DIAGNOSIS — L4059 Other psoriatic arthropathy: Secondary | ICD-10-CM | POA: Diagnosis not present

## 2016-09-17 DIAGNOSIS — G4733 Obstructive sleep apnea (adult) (pediatric): Secondary | ICD-10-CM | POA: Diagnosis not present

## 2016-09-17 DIAGNOSIS — I350 Nonrheumatic aortic (valve) stenosis: Secondary | ICD-10-CM | POA: Diagnosis not present

## 2016-09-17 DIAGNOSIS — Z881 Allergy status to other antibiotic agents status: Secondary | ICD-10-CM | POA: Diagnosis not present

## 2016-09-17 DIAGNOSIS — N3020 Other chronic cystitis without hematuria: Secondary | ICD-10-CM | POA: Diagnosis not present

## 2016-09-17 DIAGNOSIS — K219 Gastro-esophageal reflux disease without esophagitis: Secondary | ICD-10-CM | POA: Diagnosis not present

## 2016-09-17 DIAGNOSIS — D509 Iron deficiency anemia, unspecified: Secondary | ICD-10-CM | POA: Diagnosis not present

## 2016-09-17 DIAGNOSIS — I480 Paroxysmal atrial fibrillation: Secondary | ICD-10-CM | POA: Diagnosis not present

## 2016-09-17 DIAGNOSIS — E663 Overweight: Secondary | ICD-10-CM | POA: Diagnosis not present

## 2016-09-17 DIAGNOSIS — E538 Deficiency of other specified B group vitamins: Secondary | ICD-10-CM | POA: Diagnosis not present

## 2016-09-17 DIAGNOSIS — Z950 Presence of cardiac pacemaker: Secondary | ICD-10-CM | POA: Diagnosis not present

## 2016-09-17 DIAGNOSIS — Z87891 Personal history of nicotine dependence: Secondary | ICD-10-CM | POA: Diagnosis not present

## 2016-09-17 DIAGNOSIS — N3010 Interstitial cystitis (chronic) without hematuria: Secondary | ICD-10-CM | POA: Diagnosis not present

## 2016-09-17 DIAGNOSIS — Z885 Allergy status to narcotic agent status: Secondary | ICD-10-CM | POA: Diagnosis not present

## 2016-09-17 DIAGNOSIS — N3289 Other specified disorders of bladder: Secondary | ICD-10-CM | POA: Diagnosis not present

## 2016-09-17 DIAGNOSIS — D303 Benign neoplasm of bladder: Secondary | ICD-10-CM | POA: Diagnosis not present

## 2016-09-17 DIAGNOSIS — Z888 Allergy status to other drugs, medicaments and biological substances status: Secondary | ICD-10-CM | POA: Diagnosis not present

## 2016-09-17 DIAGNOSIS — I10 Essential (primary) hypertension: Secondary | ICD-10-CM | POA: Diagnosis not present

## 2016-09-17 DIAGNOSIS — M1990 Unspecified osteoarthritis, unspecified site: Secondary | ICD-10-CM | POA: Diagnosis not present

## 2016-09-17 DIAGNOSIS — Z7902 Long term (current) use of antithrombotics/antiplatelets: Secondary | ICD-10-CM | POA: Diagnosis not present

## 2016-09-17 DIAGNOSIS — E785 Hyperlipidemia, unspecified: Secondary | ICD-10-CM | POA: Diagnosis not present

## 2016-09-20 DIAGNOSIS — G4733 Obstructive sleep apnea (adult) (pediatric): Secondary | ICD-10-CM | POA: Diagnosis not present

## 2016-09-22 ENCOUNTER — Ambulatory Visit: Payer: PPO | Admitting: Neurology

## 2016-09-23 ENCOUNTER — Encounter: Payer: Self-pay | Admitting: Neurology

## 2016-09-23 ENCOUNTER — Ambulatory Visit (INDEPENDENT_AMBULATORY_CARE_PROVIDER_SITE_OTHER): Payer: PPO | Admitting: Neurology

## 2016-09-23 VITALS — BP 150/80 | HR 83 | Wt 230.1 lb

## 2016-09-23 DIAGNOSIS — E538 Deficiency of other specified B group vitamins: Secondary | ICD-10-CM

## 2016-09-23 DIAGNOSIS — Z9989 Dependence on other enabling machines and devices: Secondary | ICD-10-CM

## 2016-09-23 DIAGNOSIS — F0150 Vascular dementia without behavioral disturbance: Secondary | ICD-10-CM | POA: Diagnosis not present

## 2016-09-23 DIAGNOSIS — G4733 Obstructive sleep apnea (adult) (pediatric): Secondary | ICD-10-CM

## 2016-09-23 DIAGNOSIS — I10 Essential (primary) hypertension: Secondary | ICD-10-CM | POA: Diagnosis not present

## 2016-09-23 NOTE — Progress Notes (Addendum)
NEUROLOGY FOLLOW UP OFFICE NOTE  Christopher Baker [id number]  HISTORY OF PRESENT ILLNESS: Christopher Baker is an 83 year old right-handed man with hypertension, hyperlipidemia, paroxysmal atrial fibrillation, PPM implant for syncope, and history of prostate cancer status post radiation in 2007, who follows up for vascular dementia and falls.  He is accompanied by his wife who supplements history.   UPDATE: He responded well to Aricept.  He was more alert and was thinking more clear.  After Aricept was increased from 33m to 154m he had increased daytime somnolence.  In January, he was asked to discontinue the medication to see if symptoms resolved.  We restarted it and he began to become somnolent when he reached 1045mbut has since adjusted to it and is doing well. He is getting monthly B12 shots.  Recent level from PCP office was normal. He is now using his CPAP regularly He has not had any recurrent staring spells. He had a bladder tumor resected which, thankfully, is not cancerous. Regarding his chronic back pain, he plans to see Dr. RamNelva Bushout a spinal stimulator.  HISTORY: I.  Cardio-embolic stroke He was admitted to the hospital on 04/03/13 with left hand and arm weakness and facial droop.  CT head revealed right basal ganglia and parietal lobe infarcts.  He was found to have atrial fibrillation.  He was started on Xarelto.  He was admitted to [facility] 06/18/14 for confusion.  Episode lasted about 1 hour.  He was at dinner with his wife and had difficulty ordering from the menu.  His wife noted a left facial droop.  He did not have any slurred speech, focal numbness or weakness of the extremities.  His blood pressure was found to be in the 190s/80s.  CT of head revealed no abnormality.  Repeated CT of the head the following day showed no acute stroke.  Echo showed mildly decreased EF of 45-50%.  Carotid doppler showed 40% bilateral ICA stenosis.  An EEG was performed, which was  negative.  Differential diagnosis included TIA verse seizure and he was started on Keppra 250m53mice daily.  He was also restarted on lisinopril, which had been discontinued earlier in the month.  Since discharge, he exhibited lethargy and irritability.  Keppra was subsequently discontinued as a TIA was suspected rather than partial seizure.   II. Vascular cognitive impairment Over the last 3 years, he has had problems with memory.  He was repeating things and exhibited mental fogginess and lack of motivation.  He was experiencing depression and insomnia as well.  Procedural tasks and executive functioning were intact.  He was found to have a B12 level of 52 and was started on B12 shots.  He and his wife had noticed improvement in his memory.  However, insomnia and depression were still both an issue.  He had been under a lot of stress.  He was started on Celexa for depression and Proson for insomnia.  Last year, he exhibited extreme lethargy, to the point where he was sleeping during the day, which he never previously done.  Sometimes, he was in such a deep sleep, it required repeated yelling and shaking to wake him up.  He seemed more "foggy" again.  One time, he couldn't figure out where to put the ice cream in the refrigerator.  When his wife told him to put it in the freezer, he was placing it in the vegetable drawer.  He decided to stop the Proson, and he and  his wife noticed significant improvement in lethargy.  He did note some improvement in memory since starting B12 supplementation.  Memory seemed worse after the stroke, so cognitive deficits were thought to be also related to stroke.   He had a formal driving evaluation in July 2015.  He was told to drive with someone else in the car for a couple of months so he can regain confidence.  The only restriction was that he should not drive at night unless it is a well-lit road.  He has since started driving alone sometimes, but only locally and during  daylight hours.     He had formal neuropsych testing performed by Dr. Valentina Shaggy in January 2016.  Profile was consistent with outcome of a right hemispheric stroke: deficits in visual processing speed, non-dominant hand fine motor coordination/speed, and visual spatial dysfunction.  He underwent repeat neuropsychological testing on 03/15/16, which revealed mild dementia, likely multifactorial (cerebrovascular, OSA, B12 deficiency).  He demonstrated frontal-subcortical involvement.  However, he demonstrated decline in verbal memory and language since his prior evaluation, suggesting left hemispheric involvement rather than right hemispheric involvement.  This appears to be more likely due to cerebrovascular disease rather than Alzheimer's.   III Staring Spells: He reportedly has brief staring spells, lasting just seconds.  Routine EEG was performed on 03/17/16, which showed some diffuse slowing but no epileptiform discharges.  24 hour ambulatory EEG from 03/30/16 to 03/31/16 was performed and demonstrated mild diffuse slowing but otherwise unremarkable.  CT of head was performed on 03/31/16 was personally reviewed. It did not demonstrate a left hemispheric stroke, but it did show progressive atrophy compared to prior study from 10/31/14, and possible subacute or progression of chronic ischemia in the right parietal cortex.  Carotid doppler from 04/02/16 showed stable 40-59% left ICA stenosis but progressed right ICA stenosis at 60-79% (from 40-59% on 06/18/14).  Since starting B12, he feels better and more clear.   IV Tremor He notes mild tremor in his hands, noticeable when he holds a utensil or writes.  He first noticed this about a month or so ago.  He has some balance issues due to bilateral knee replacement surgeries.  No family history of dementia or tremor (possibly his mother had tremor).  He takes ropinirole for RLS   V Leg weakness/gait instability: He has had increased balance problems with low back  pain with pain radiating down the right leg.  His legs feel weak.  Pain is worse when standing and walking.  He also notes right groin pain.  He was evaluated by his neurosurgeon and reportedly has arthritis but nothing surgical to be done.  He is followed by orthopedist and pain specialist.  He has had epidural injections as well as injection in the right hip, which has only exacerbated pain.  1.  Vascular dementia (mild), but cannot rule out underlying neurodegenerative disease such as Alzheimer's.  Since there is no corresponding stroke to account for the language impairment on neuropsychological testing, and repeat head CT has shown progression of generalized cerebral atrophy, underlying Alzheimer's disease in addition to vascular dementia, is possible. 2.  Spells of decreased attention.  I do not suspect seizure or TIAs.  May be symptom of dementia or other medical comorbidities. 3.  B12 deficiency  PAST MEDICAL HISTORY: Past Medical History:  Diagnosis Date  . Arthritis with psoriasis ([place])   . Borderline diabetes    DIET CONTROLLED - PT STATES HE IS NOT DIABETIC  . Cancer ([place])  prostate  2008  . Carotid stenosis, bilateral PER DR EARLY / DUPLEX  09-09-10     40 - 59%   BILATERALLY--- ASYMPTOMATIC  . Cataract immature LEFT EYE  . Dysrhythmia    a-fib  . GERD (gastroesophageal reflux disease)    CONTROLLED W/ PROTONIX  . History of prostate cancer S/P RADIATION  [place]  6 YRS AGO  . HTN (hypertension)   . Hyperlipidemia   . Iron deficiency   . Lumbar spondylosis W/ RADICULOPATHY  . PAF (paroxysmal atrial fibrillation) ([place])   . Restless leg syndrome   . Sleep apnea TESTED YRS AGO-- NO CPAP RX GIVEN   PT STATES HE DOES NOT THINK HE STILL HAS SLEEP APNEA--DOES NOT USE CPAP  . Status post placement of cardiac pacemaker DDD-  06-04-10-- LAST CHECK 09-08-10 IN EPIC   SECONDARY TO SYNCOPY AND BRADYCARDIA  . Stroke [facility]) Oct. 14, 2014   light left hand, balance issues, short term memory  loss 2014  . SVT (supraventricular tachycardia) ([place])    S/P ABLATION   2008    MEDICATIONS: Current Outpatient Prescriptions on File Prior to Visit  Medication Sig Dispense Refill  . apixaban (ELIQUIS) 5 MG TABS tablet Take 1 tablet (5 mg total) by mouth 2 (two) times daily. 60 tablet 9  . atorvastatin (LIPITOR) 20 MG tablet Take 20 mg by mouth daily.    . clotrimazole-betamethasone (LOTRISONE) cream Apply 1 application topically 2 (two) times daily as needed (psoriasis).    . Cyanocobalamin (B-12) 1000 MCG/ML KIT Inject as directed.    . docusate sodium (COLACE) 100 MG capsule Take 100 mg by mouth as needed. Reported on 12/11/2015    . donepezil (ARICEPT) 10 MG tablet Take 1 tablet (10 mg total) by mouth at bedtime. 30 tablet 3  . ezetimibe (ZETIA) 10 MG tablet Take 1 tablet (10 mg total) by mouth daily. 90 tablet 1  . folic acid (FOLVITE) 1 MG tablet Take 1 mg by mouth daily.    . furosemide (LASIX) 20 MG tablet Take 2 tablets (40 mg total) by mouth daily. 60 tablet 9  . lisinopril (PRINIVIL,ZESTRIL) 5 MG tablet Take 1 tablet (5 mg total) by mouth daily. 90 tablet 2  . methotrexate (RHEUMATREX) 2.5 MG tablet Take 4 tablets (10 mg total) by mouth once a week. Caution:Chemotherapy. Protect from light. Every monday 4 tablet 1  . Misc. Devices (ENDURANCE FOUR LEG SEAT CANE) MISC 1 each by Does not apply route as directed. 1 each 0  . Misc. Devices (STEP N REST WALKER) MISC Use as directed 1 each 0  . pantoprazole (PROTONIX) 40 MG tablet Take 1 tablet (40 mg total) by mouth daily. 90 tablet 3  . predniSONE (DELTASONE) 5 MG tablet Take 1.5 tablets (7.5 mg total) by mouth daily. 135 tablet 0  . sertraline (ZOLOFT) 25 MG tablet TAKE TWO TABLETS BY MOUTH DAILY 60 tablet 2  . triamcinolone cream (KENALOG) 0.1 % Apply to affected area as directed (Patient taking differently: Apply 1 application topically daily as needed (rash). Apply to affected area as directed) 480 g 0   No current  facility-administered medications on file prior to visit.     ALLERGIES: Allergies  Allergen Reactions  . Ciprofloxacin Nausea Only    REACTION: GI upset  . Hydrocodone-Acetaminophen     Hallucinations in higher doses  . Other Other (See Comments)    SCALLOPS--SUDDEN GI ATTACK    FAMILY HISTORY: Family History  Problem Relation Age of Onset  .  Stroke Mother   . Early death Father     SOCIAL HISTORY: Social History   Social History  . Marital status: Married    Spouse name: N/A  . Number of children: N/A  . Years of education: N/A   Occupational History  . retired    Social History Main Topics  . Smoking status: Former Smoker    Packs/day: 0.10    Years: 55.00    Types: Cigarettes    Quit date: 12/07/1998  . Smokeless tobacco: Never Used  . Alcohol use 3.0 oz/week    5 Shots of liquor per week     Comment: gin and tonic  . Drug use: No  . Sexual activity: Yes    Partners: Female   Other Topics Concern  . Not on file   Social History Narrative  . No narrative on file    REVIEW OF SYSTEMS: Constitutional: No fevers, chills, or sweats, no generalized fatigue, change in appetite Eyes: No visual changes, double vision, eye pain Ear, nose and throat: No hearing loss, ear pain, nasal congestion, sore throat Cardiovascular: No chest pain, palpitations Respiratory:  No shortness of breath at rest or with exertion, wheezes GastrointestinaI: No nausea, vomiting, diarrhea, abdominal pain, fecal incontinence Genitourinary:  Dysuria (secondary to recent bladder surgery) Musculoskeletal:  Back pain Integumentary: No rash, pruritus, skin lesions Neurological: as above Psychiatric: No depression, insomnia, anxiety Endocrine: No palpitations, fatigue, diaphoresis, mood swings, change in appetite, change in weight, increased thirst Hematologic/Lymphatic:  No purpura, petechiae. Allergic/Immunologic: no itchy/runny eyes, nasal congestion, recent allergic reactions,  rashes  PHYSICAL EXAM: Vitals:   09/23/16 1428  BP: (!) 150/80  Pulse: 83   General: No acute distress.  Patient appears well-groomed.  normal body habitus. Head:  Normocephalic/atraumatic Eyes:  Fundi examined but not visualized Neck: supple, no paraspinal tenderness, full range of motion Heart:  Regular rate and rhythm Lungs:  Clear to auscultation bilaterally Back: No paraspinal tenderness Neurological Exam: alert and oriented to person, place, and time. Attention span and concentration intact, delayed recall poor, remote memory intact, fund of knowledge intact.  Speech fluent and not dysarthric, language intact.   CN II-XII intact. Bulk and tone normal, muscle strength 5/5 throughout.  Reduced finger-thumb tapping speed bilaterally.  Finger to nose testing intact with fine postural and kinetic tremor.  Reduced pinprick and vibration sensation in feet and hands.  Deep tendon reflexes trace throughout except absent in ankles and right patellars.  Antalgic gait.  IMPRESSION: 1.  Vascular dementia (mild), but cannot rule out underlying neurodegenerative disease such as Alzheimer's.  Since there is no corresponding stroke to account for the language impairment on neuropsychological testing, and repeat head CT has shown progression of generalized cerebral atrophy, underlying Alzheimer's disease in addition to vascular dementia, is possible. 2.  Spells of decreased attention, improved.  I do not suspect seizure or TIAs.  May be symptom of dementia or OSA, which are being treated. 3.  B12 deficiency 4.  OSA 5.  HTN  PLAN: 1.  Aricept 60m at bedtime 2.  Continue monthly B12 shots with PCP 3.  Continue use of CPAP 4.  4.  As he is no longer having spells, I advised that he may start driving with his wife in the passenger seat, but only during daylight hours and off-highway driving 5.  Repeat neurocognitive testing with Dr. BSi Raiderin 6 months.  Follow up with me soon afterwards. 6.  Follow up  BP with PCP  27 minutes  spent face to face with patient, over 50% spent discussing diagnosis and managment.   Christopher Clines, DO  CC: Domenick Gong, MD

## 2016-09-23 NOTE — Patient Instructions (Signed)
1.  Continue Aricept 10mg  at bedtime 2.  Continue B12 shots every month. 3.  Continue using CPAP 4.  You may start driving with your wife in the passenger seat, but only during daylight hours and off-highway driving 5.  Repeat neurocognitive testing with Dr. Si Raider in 6 months.  Follow up with me soon afterwards.

## 2016-10-05 DIAGNOSIS — R310 Gross hematuria: Secondary | ICD-10-CM | POA: Diagnosis not present

## 2016-10-06 DIAGNOSIS — R197 Diarrhea, unspecified: Secondary | ICD-10-CM | POA: Diagnosis not present

## 2016-10-07 DIAGNOSIS — M47816 Spondylosis without myelopathy or radiculopathy, lumbar region: Secondary | ICD-10-CM | POA: Diagnosis not present

## 2016-10-07 DIAGNOSIS — M5416 Radiculopathy, lumbar region: Secondary | ICD-10-CM | POA: Diagnosis not present

## 2016-10-07 DIAGNOSIS — M961 Postlaminectomy syndrome, not elsewhere classified: Secondary | ICD-10-CM | POA: Diagnosis not present

## 2016-10-12 DIAGNOSIS — N3289 Other specified disorders of bladder: Secondary | ICD-10-CM | POA: Diagnosis not present

## 2016-10-12 DIAGNOSIS — F0280 Dementia in other diseases classified elsewhere without behavioral disturbance: Secondary | ICD-10-CM | POA: Diagnosis not present

## 2016-10-12 DIAGNOSIS — R350 Frequency of micturition: Secondary | ICD-10-CM | POA: Diagnosis not present

## 2016-10-12 DIAGNOSIS — N529 Male erectile dysfunction, unspecified: Secondary | ICD-10-CM | POA: Diagnosis not present

## 2016-10-12 DIAGNOSIS — Z8546 Personal history of malignant neoplasm of prostate: Secondary | ICD-10-CM | POA: Diagnosis not present

## 2016-10-18 ENCOUNTER — Telehealth: Payer: Self-pay | Admitting: Pharmacist

## 2016-10-18 NOTE — Telephone Encounter (Signed)
Received fax from [facility] Dr. Earlean Shawl for clearance to hold Eliquis for colonoscopy. Pt on Eliquis for Afib with history of stroke. Per protocol ok to hold 24 hours prior to procedure and resume ASAP after procedure given history of stroke. Faxed back to number provided [phone]

## 2016-10-22 DIAGNOSIS — M47816 Spondylosis without myelopathy or radiculopathy, lumbar region: Secondary | ICD-10-CM | POA: Diagnosis not present

## 2016-10-22 DIAGNOSIS — M5416 Radiculopathy, lumbar region: Secondary | ICD-10-CM | POA: Diagnosis not present

## 2016-10-22 DIAGNOSIS — M961 Postlaminectomy syndrome, not elsewhere classified: Secondary | ICD-10-CM | POA: Diagnosis not present

## 2016-10-28 DIAGNOSIS — G4733 Obstructive sleep apnea (adult) (pediatric): Secondary | ICD-10-CM | POA: Diagnosis not present

## 2016-11-09 DIAGNOSIS — M9904 Segmental and somatic dysfunction of sacral region: Secondary | ICD-10-CM | POA: Diagnosis not present

## 2016-11-09 DIAGNOSIS — M9905 Segmental and somatic dysfunction of pelvic region: Secondary | ICD-10-CM | POA: Diagnosis not present

## 2016-11-09 DIAGNOSIS — M9903 Segmental and somatic dysfunction of lumbar region: Secondary | ICD-10-CM | POA: Diagnosis not present

## 2016-11-09 DIAGNOSIS — M5136 Other intervertebral disc degeneration, lumbar region: Secondary | ICD-10-CM | POA: Diagnosis not present

## 2016-11-10 DIAGNOSIS — M5136 Other intervertebral disc degeneration, lumbar region: Secondary | ICD-10-CM | POA: Diagnosis not present

## 2016-11-10 DIAGNOSIS — M9904 Segmental and somatic dysfunction of sacral region: Secondary | ICD-10-CM | POA: Diagnosis not present

## 2016-11-10 DIAGNOSIS — M9905 Segmental and somatic dysfunction of pelvic region: Secondary | ICD-10-CM | POA: Diagnosis not present

## 2016-11-10 DIAGNOSIS — M9903 Segmental and somatic dysfunction of lumbar region: Secondary | ICD-10-CM | POA: Diagnosis not present

## 2016-11-11 ENCOUNTER — Telehealth: Payer: Self-pay | Admitting: Internal Medicine

## 2016-11-11 NOTE — Telephone Encounter (Signed)
Called, spoke with pt's wife, [name]. Informed pt should hold Eliquis 24 hours prior to procedure. The doctor performing colonoscopy will give instructions when pt should start the medication back. [name] verbalized understanding and thanked me for calling.

## 2016-11-11 NOTE — Telephone Encounter (Signed)
Follow Up:    Please,wife said she lost instructions on what pt should do about stopping his Eliquis for his Colonoscopy.

## 2016-11-12 DIAGNOSIS — M9905 Segmental and somatic dysfunction of pelvic region: Secondary | ICD-10-CM | POA: Diagnosis not present

## 2016-11-12 DIAGNOSIS — M5136 Other intervertebral disc degeneration, lumbar region: Secondary | ICD-10-CM | POA: Diagnosis not present

## 2016-11-12 DIAGNOSIS — M9904 Segmental and somatic dysfunction of sacral region: Secondary | ICD-10-CM | POA: Diagnosis not present

## 2016-11-12 DIAGNOSIS — M9903 Segmental and somatic dysfunction of lumbar region: Secondary | ICD-10-CM | POA: Diagnosis not present

## 2016-11-16 DIAGNOSIS — M9903 Segmental and somatic dysfunction of lumbar region: Secondary | ICD-10-CM | POA: Diagnosis not present

## 2016-11-16 DIAGNOSIS — M9905 Segmental and somatic dysfunction of pelvic region: Secondary | ICD-10-CM | POA: Diagnosis not present

## 2016-11-16 DIAGNOSIS — M9904 Segmental and somatic dysfunction of sacral region: Secondary | ICD-10-CM | POA: Diagnosis not present

## 2016-11-16 DIAGNOSIS — M5136 Other intervertebral disc degeneration, lumbar region: Secondary | ICD-10-CM | POA: Diagnosis not present

## 2016-11-17 DIAGNOSIS — M9904 Segmental and somatic dysfunction of sacral region: Secondary | ICD-10-CM | POA: Diagnosis not present

## 2016-11-17 DIAGNOSIS — M5136 Other intervertebral disc degeneration, lumbar region: Secondary | ICD-10-CM | POA: Diagnosis not present

## 2016-11-17 DIAGNOSIS — Z5181 Encounter for therapeutic drug level monitoring: Secondary | ICD-10-CM | POA: Diagnosis not present

## 2016-11-17 DIAGNOSIS — M9903 Segmental and somatic dysfunction of lumbar region: Secondary | ICD-10-CM | POA: Diagnosis not present

## 2016-11-17 DIAGNOSIS — M9905 Segmental and somatic dysfunction of pelvic region: Secondary | ICD-10-CM | POA: Diagnosis not present

## 2016-11-18 ENCOUNTER — Telehealth: Payer: Self-pay

## 2016-11-18 DIAGNOSIS — K621 Rectal polyp: Secondary | ICD-10-CM | POA: Diagnosis not present

## 2016-11-18 DIAGNOSIS — R197 Diarrhea, unspecified: Secondary | ICD-10-CM | POA: Diagnosis not present

## 2016-11-18 DIAGNOSIS — Z86010 Personal history of colonic polyps: Secondary | ICD-10-CM | POA: Diagnosis not present

## 2016-11-18 DIAGNOSIS — Z1211 Encounter for screening for malignant neoplasm of colon: Secondary | ICD-10-CM | POA: Diagnosis not present

## 2016-11-18 DIAGNOSIS — K648 Other hemorrhoids: Secondary | ICD-10-CM | POA: Diagnosis not present

## 2016-11-18 DIAGNOSIS — D128 Benign neoplasm of rectum: Secondary | ICD-10-CM | POA: Diagnosis not present

## 2016-11-18 NOTE — Telephone Encounter (Signed)
Sent phone note date 10/18/16 from Eino Farber, [facility] (r/t Eliquis for colonscopy) and clearance document to medical records to be faxed to [facility] [phone].

## 2016-11-23 ENCOUNTER — Telehealth: Payer: Self-pay | Admitting: Internal Medicine

## 2016-11-23 NOTE — Telephone Encounter (Signed)
New message     Pt c/o swelling: STAT is pt has developed SOB within 24 hours  1. How long have you been experiencing swelling? A few days  2. Where is the swelling located? Feet and legs   3.  Are you currently taking a "fluid pill"? yes  4.  Are you currently SOB?  no  5.  Have you traveled recently? no

## 2016-11-23 NOTE — Telephone Encounter (Signed)
Patient having edema BLE for about a year and his lasix is not helping. Patient has some SOB due to back pain, but denies any other symptoms. Patient has appointment this week with Tommye Standard PA.

## 2016-11-24 NOTE — Progress Notes (Signed)
Cardiology Office Note Date:  11/25/2016  Patient ID:  Christopher Baker, Christopher Baker November 20, 1933, MRN [id number] PCP:  Christopher Pao, MD  Electrophysiologist: Dr. Lovena Baker   Chief Complaint: annual visit, edema  History of Present Illness: Christopher Baker is a 83 y.o. male with history of symptomatic bradycardia w/PPM, unexplained syncope, HTN, CVA, PAFib, comes in today to be seen for Dr. Lovena Baker.  Last seen by him in June 2017, at that time he noted some medication/dietary non-compliance, had symptoms of orthostasis and his ACE was reduced.    He comes today accompanied by his wife who since his CVA keeps track of him, his medicines, though he is AAO x3, has short term memory issues.  His primary limiting and concerns are 3.  Most of all his severe and chronic back pain, this has almost become debilitating to him, ambulating with a walker 2/2 to it.  #2 is is urinary incontinence since his CVA and prostate surgery worse.  And 3 is his Baker swelling.  It is described as chronic by record and patient/wife, while concerned, he has not taken his lasix at all in "months and months" because it aggravates his incontinence.  He denies any kind of SOB, no DOE, no nighttime symptoms of PND or orthopnea.  No CP, no dizziness, near syncope or syncope.  He bruises easily and has intermittent hematuria and + FOB both being monitored seeing urology and GI, recently having a colonoscopy he reports was OK and is back on his Eliquis, neither MD have recommended it be stopped.  He denies any nose bleeding or bleeding otherwise.  They prefer in-clinic pacer check to remotes, discussed benefits of remote monitoring but they do not want to and prefer coming in.   Device information: MDT dual chamber PPM implanted 06/05/10, Dr. Lovena Baker   Past Medical History:  Diagnosis Date  . Arthritis with psoriasis ([place])   . Borderline diabetes    DIET CONTROLLED - PT STATES HE IS NOT DIABETIC  . Cancer [facility])    prostate  2008  .  Carotid stenosis, bilateral PER DR EARLY / DUPLEX  09-09-10     40 - 59%   BILATERALLY--- ASYMPTOMATIC  . Cataract immature LEFT EYE  . Dysrhythmia    a-fib  . GERD (gastroesophageal reflux disease)    CONTROLLED W/ PROTONIX  . History of prostate cancer S/P RADIATION  TX  6 YRS AGO  . HTN (hypertension)   . Hyperlipidemia   . Iron deficiency   . Lumbar spondylosis W/ RADICULOPATHY  . PAF (paroxysmal atrial fibrillation) ([place])   . Restless leg syndrome   . Sleep apnea TESTED YRS AGO-- NO CPAP RX GIVEN   PT STATES HE DOES NOT THINK HE STILL HAS SLEEP APNEA--DOES NOT USE CPAP  . Status post placement of cardiac pacemaker DDD-  06-04-10-- LAST CHECK 09-08-10 IN EPIC   SECONDARY TO SYNCOPY AND BRADYCARDIA  . Stroke [facility]) Oct. 14, 2014   light left hand, balance issues, short term memory loss 2014  . SVT (supraventricular tachycardia) ([place])    S/P ABLATION   2008    Past Surgical History:  Procedure Laterality Date  . BACK SURGERY    . CARDIAC ELECTROPHYSIOLOGY STUDY AND ABLATION  2008   FOR SVT  . CARDIAC PACEMAKER PLACEMENT  06-04-10   DDD/ AND REMOVAL LOOR RECORDER  . CHOLECYSTECTOMY  2009  . ESOPHAGOGASTRODUODENOSCOPY N/A 12/12/2014   Procedure: ESOPHAGOGASTRODUODENOSCOPY (EGD);  Surgeon: Wilford Corner, MD;  Location: [facility] ENDOSCOPY;  Service: Endoscopy;  Laterality: N/A;  . INGUINAL HERNIA REPAIR  2005   LEFT/   DONE WITH PENILE PROSTESIS SURG.  . JOINT REPLACEMENT    . KNEE ARTHROSCOPY  06/02/2011   Procedure: ARTHROSCOPY KNEE;  Surgeon: Gearlean Alf;  Location: [place];  Service: Orthopedics;  Laterality: Left;  LEFT KNEE ARTHROSCOPY WITH DEBRIDEMENT  . LOOP RECORDER PLACEMENT  01-30-10  . LUMBAR LAMINECTOMY/ DISKECTOMY/ FUSION  05-19-10   L4 - 5  . PENILE PROSTHESIS IMPLANT  1995  . PROSTATE BIOPSY  2007  . REMOVAL AND PLACEMENT PENILE PROSTHESIS IMPLANT   2005   AND LEFT CORPOROPLASTY /    DONE INGUINAL REPAIR  . REPLACEMENT TOTAL KNEE  1997    RIGHT  . SPINE SURGERY    . TOTAL KNEE ARTHROPLASTY  12/20/2011   Procedure: TOTAL KNEE ARTHROPLASTY;  Surgeon: Gearlean Alf, MD;  Location: [facility];  Service: Orthopedics;  Laterality: Left;  . TOTAL SHOULDER ARTHROPLASTY Right 06/27/2015   Procedure: REVERSE TOTAL SHOULDER ARTHROPLASTY;  Surgeon: Netta Cedars, MD;  Location: [place];  Service: Orthopedics;  Laterality: Right;  . TRANSURETHRAL RESECTION OF PROSTATE  2006    Current Outpatient Prescriptions  Medication Sig Dispense Refill  . amoxicillin (AMOXIL) 500 MG capsule     . apixaban (ELIQUIS) 5 MG TABS tablet Take 1 tablet (5 mg total) by mouth 2 (two) times daily. 60 tablet 9  . atorvastatin (LIPITOR) 20 MG tablet Take 20 mg by mouth daily.    . Cyanocobalamin (B-12) 1000 MCG/ML KIT Inject as directed.    . donepezil (ARICEPT) 10 MG tablet Take 1 tablet (10 mg total) by mouth at bedtime. 30 tablet 3  . ezetimibe (ZETIA) 10 MG tablet Take 1 tablet (10 mg total) by mouth daily. 90 tablet 1  . folic acid (FOLVITE) 1 MG tablet Take 1 mg by mouth daily.    . furosemide (LASIX) 20 MG tablet Take 2 tablets (40 mg total) by mouth daily. 60 tablet 9  . IRON PO Take 65 mg by mouth daily.    Marland Kitchen lisinopril (PRINIVIL,ZESTRIL) 5 MG tablet Take 1 tablet (5 mg total) by mouth daily. 90 tablet 2  . methotrexate (RHEUMATREX) 2.5 MG tablet Take 4 tablets (10 mg total) by mouth once a week. Caution:Chemotherapy. Protect from light. Every monday 4 tablet 1  . Misc. Devices (ENDURANCE FOUR LEG SEAT CANE) MISC 1 each by Does not apply route as directed. 1 each 0  . Misc. Devices (STEP N REST WALKER) MISC Use as directed 1 each 0  . oxybutynin (DITROPAN) 5 MG tablet Take 5 mg by mouth 2 (two) times daily.     . pantoprazole (PROTONIX) 40 MG tablet Take 1 tablet (40 mg total) by mouth daily. 90 tablet 3  . predniSONE (DELTASONE) 5 MG tablet Take 1.5 tablets (7.5 mg total) by mouth daily. 135 tablet 0  . sertraline (ZOLOFT) 25 MG tablet TAKE TWO TABLETS BY  MOUTH DAILY 60 tablet 2  . traMADol (ULTRAM) 50 MG tablet     . Vitamin D, Ergocalciferol, (DRISDOL) 50000 units CAPS capsule Take 50,000 Units by mouth every 7 (seven) days.     No current facility-administered medications for this visit.     Allergies:   Ciprofloxacin; Hydrocodone-acetaminophen; and Other   Social History:  The patient  reports that he quit smoking about 17 years ago. His smoking use included Cigarettes. He has a 5.50 pack-year smoking history. He has never used smokeless  tobacco. He reports that he drinks about 3.0 oz of alcohol per week . He reports that he does not use drugs.   Family History:  The patient's family history includes Early death in his father; Stroke in his mother.  ROS:  Please see the history of present illness.   All other systems are reviewed and otherwise negative.   PHYSICAL EXAM:  VS:  BP 118/68   Pulse 76   Ht 6' 3.5" (1.918 m)   Wt 220 lb (99.8 kg)   BMI 27.14 kg/m  BMI: Body mass index is 27.14 kg/m. Well nourished, well developed, in no acute distress  HEENT: normocephalic, atraumatic  Neck: no JVD, carotid bruits or masses Cardiac:  RRR; 2/6 SM, no rubs, or gallops Lungs:  CTA b/l, no wheezing, rhonchi or rales  Abd: soft, nontender MS: no deformity or atrophy Ext: 2+ edema b/l Baker edema to just below the knees  Skin: warm and dry, no rash Neuro:  No gross deficits appreciated Psych: euthymic mood, full affect  PPM site is stable, no tethering or discomfort   EKG:  Done today and reviewed by myself shows SR, marked 1st degree AVblock, PR 469m,ST/T changes, appears similar to his last, more pronounced PPM interrogation done today and reviewed by myself: Battery and lead measurements are good, RV lead capture management threshold was 1.25-1.5, in office is 0.75, outputs were programmed to 2.5V, capture management was programmed to monitor.  + Mode switch episodes, <1%, 61 high V rate episodes (in the last year).  Longest 16  seconds, no EGMs, markers only and suggest true NSVT   10/10/14: TTE Study Conclusions - Left ventricle: The cavity size was at the upper limits of   normal. Wall thickness was normal. The estimated ejection   fraction was 50%. Diffuse hypokinesis. Doppler parameters are   consistent with abnormal left ventricular relaxation (grade 1   diastolic dysfunction). - Aortic valve: Trileaflet; moderately calcified leaflets. There   was moderate stenosis. Mean gradient (S): 24 mm Hg. Valve area   (VTI): 1.2 cm^2. - Mitral valve: There was mild regurgitation. - Left atrium: The atrium was mildly dilated. - Right ventricle: The cavity size was normal. Pacer wire or   catheter noted in right ventricle. Systolic function was normal. - Tricuspid valve: Peak RV-RA gradient (S): 26 mm Hg. - Pulmonary arteries: PA peak pressure: 29 mm Hg (S). - Inferior vena cava: The vessel was normal in size. The   respirophasic diameter changes were in the normal range (>= 50%),   consistent with normal central venous pressure. Impressions: - Borderline dilated LV with EF 50%, mild diffuse hypokinesis.   Normal RV size and systolic function. Moderate aortic stenosis.   Mild MR.   Recent Labs: No results found for requested labs within last 8760 hours.  No results found for requested labs within last 8760 hours.   CrCl cannot be calculated (Patient's most recent lab result is older than the maximum 21 days allowed.).   Wt Readings from Last 3 Encounters:  11/25/16 220 lb (99.8 kg)  09/23/16 230 lb 2 oz (104.4 kg)  04/08/16 222 lb 3 oz (100.8 kg)     Other studies reviewed: Additional studies/records reviewed today include: summarized above  ASSESSMENT AND PLAN:  1. CHF     Significant Baker edema, at his basline for about a year, no SOB     He has not been on lasix at all for many months, lengthy discussion and he is agreeable  to resuming his lasix 66m daily  2. Paroxysmal AFib     CHA2DS2Vasc is at  least 4, on Eliquis  3. PPM     MVP is on, marked PR, 28% V paced as programmed, 1st degree block is similar to last year     Device function is intact      4. HTN     Looks OK today  5. SM on exam, sounds of AS     Will get an echo  6.  NSVT     Check BMET/ mag, check echo  Disposition: F/u with BMET/ in a week on lasix, echo doppler, he wants in-clinic pacer checks, plan for 6 months.  I will discuss pacer programing with Dr. TLovena Baker though he his not clearly or at all symptomatic that I can tell and very sedentary given his severe back issues. Will see him in 3 months, sooner if needed.  Current medicines are reviewed at length with the patient today.  The patient did not have any concerns regarding medicines.  SHaywood Lasso PA-C 11/25/2016 11:19 AM     [facility] [address] [phone](office)  [phone])

## 2016-11-25 ENCOUNTER — Ambulatory Visit (INDEPENDENT_AMBULATORY_CARE_PROVIDER_SITE_OTHER): Payer: PPO | Admitting: Physician Assistant

## 2016-11-25 VITALS — BP 118/68 | HR 76 | Ht 75.5 in | Wt 220.0 lb

## 2016-11-25 DIAGNOSIS — I10 Essential (primary) hypertension: Secondary | ICD-10-CM

## 2016-11-25 DIAGNOSIS — I480 Paroxysmal atrial fibrillation: Secondary | ICD-10-CM

## 2016-11-25 DIAGNOSIS — I472 Ventricular tachycardia: Secondary | ICD-10-CM

## 2016-11-25 DIAGNOSIS — Z950 Presence of cardiac pacemaker: Secondary | ICD-10-CM | POA: Diagnosis not present

## 2016-11-25 DIAGNOSIS — R011 Cardiac murmur, unspecified: Secondary | ICD-10-CM

## 2016-11-25 DIAGNOSIS — I509 Heart failure, unspecified: Secondary | ICD-10-CM | POA: Diagnosis not present

## 2016-11-25 DIAGNOSIS — I4891 Unspecified atrial fibrillation: Secondary | ICD-10-CM | POA: Diagnosis not present

## 2016-11-25 DIAGNOSIS — I4729 Other ventricular tachycardia: Secondary | ICD-10-CM

## 2016-11-25 NOTE — Patient Instructions (Signed)
Medication Instructions:   START TAKING LASIX 20 MG TWICE A DAY   ( TAKE ONE IN THE AM AND ONE IN THE MIDDAY)  If you need a refill on your cardiac medications before your next appointment, please call your pharmacy.  Labwork:  BMET  AND MAG TODAY   ON SAME DAY AS ECHO REPEAT BMET    Testing/Procedures: Your physician has requested that you have an echocardiogram. Echocardiography is a painless test that uses sound waves to create images of your heart. It provides your doctor with information about the size and shape of your heart and how well your heart's chambers and valves are working. This procedure takes approximately one hour. There are no restrictions for this procedure.    Follow-Up:  IN 3 MONTHS WITH RENEE    Your physician wants you to follow-up in:  IN  [place] will receive a reminder letter in the mail two months in advance. If you don't receive a letter, please call our office to schedule the follow-up appointment.  '   Any Other Special Instructions Will Be Listed Below (If Applicable).

## 2016-11-26 ENCOUNTER — Other Ambulatory Visit: Payer: Self-pay | Admitting: Internal Medicine

## 2016-11-26 LAB — BASIC METABOLIC PANEL
BUN/Creatinine Ratio: 25 — ABNORMAL HIGH (ref 10–24)
BUN: 24 mg/dL (ref 8–27)
CALCIUM: 9.6 mg/dL (ref 8.6–10.2)
CHLORIDE: 110 mmol/L — AB (ref 96–106)
CO2: 22 mmol/L (ref 18–29)
Creatinine, Ser: 0.96 mg/dL (ref 0.76–1.27)
GFR calc non Af Amer: 74 mL/min/{1.73_m2} (ref >59)
GFR, EST AFRICAN AMERICAN: 85 mL/min/{1.73_m2} (ref >59)
Glucose: 96 mg/dL (ref 65–99)
POTASSIUM: 4.4 mmol/L (ref 3.5–5.2)
Sodium: 146 mmol/L — ABNORMAL HIGH (ref 134–144)

## 2016-11-26 LAB — MAGNESIUM: MAGNESIUM: 2.3 mg/dL (ref 1.6–2.3)

## 2016-11-27 ENCOUNTER — Other Ambulatory Visit: Payer: Self-pay | Admitting: Neurology

## 2016-12-06 ENCOUNTER — Telehealth: Payer: Self-pay | Admitting: Physician Assistant

## 2016-12-06 NOTE — Telephone Encounter (Signed)
New message     If no answer on home call cell she is returning your call for lab results

## 2016-12-06 NOTE — Telephone Encounter (Signed)
LMOVM TO CALL BACK  

## 2016-12-07 ENCOUNTER — Telehealth: Payer: Self-pay | Admitting: *Deleted

## 2016-12-07 MED ORDER — METOPROLOL SUCCINATE ER 25 MG PO TB24
25.0000 mg | ORAL_TABLET | Freq: Every day | ORAL | 1 refills | Status: DC
Start: 2016-12-07 — End: 2017-06-20

## 2016-12-07 NOTE — Telephone Encounter (Signed)
SPOKE TO PT WIFE ABOUT RESULTS OF BMET AND MAG DRAWN ON 6 -7 -18 AND FOLLOW UP BMET ON 6-20. WIFE  MENTIONED SWELLING  IN LEGS AND FEET  HAVE NOT GONE DOWN YET.  BUT PT HAS NOT BEING TAKING LASIX 20 MG TWICE A DAY. PT HAS ONLY BEEN TAKING ONE TABLET MIDDAY.   PT WIFE WAS ENCOURAGE TO MAKE SURE HE TAKE LASIX 20 MG TWICE A DAY MONITOR SWELLING AND CONTACT OFFICE BACK WITH UPDATE ON ANY CHANGES.  ALSO DISCUSSED NEW MEDICATION TOPROL XL 25 MG ONCE A DAY

## 2016-12-07 NOTE — Telephone Encounter (Signed)
-----   Message from [facility], [place] sent at 11/26/2016  1:05 PM EDT ----- Please let the patient know his labs looked OK.  No changes to the plan we discussed.  thanks [employer]

## 2016-12-07 NOTE — Telephone Encounter (Signed)
-----   Message from [facility], [place] sent at 11/26/2016  1:05 PM EDT ----- Please let the patient know his labs looked OK.  No changes to the plan we discussed.  thanks [employer]

## 2016-12-08 ENCOUNTER — Other Ambulatory Visit: Payer: PPO

## 2016-12-08 ENCOUNTER — Ambulatory Visit (HOSPITAL_COMMUNITY): Payer: PPO | Attending: Internal Medicine

## 2016-12-13 ENCOUNTER — Telehealth: Payer: Self-pay | Admitting: *Deleted

## 2016-12-13 DIAGNOSIS — G4733 Obstructive sleep apnea (adult) (pediatric): Secondary | ICD-10-CM | POA: Diagnosis not present

## 2016-12-13 NOTE — Telephone Encounter (Signed)
LMOVM TO CALL CLINIC BACK ABOUT MISSED ECHO AND REPEAT LAB WORK TO SEE IF NEEDS TO BE RESCHEDULE

## 2016-12-15 ENCOUNTER — Other Ambulatory Visit (HOSPITAL_COMMUNITY): Payer: PPO

## 2016-12-15 ENCOUNTER — Telehealth: Payer: Self-pay | Admitting: *Deleted

## 2016-12-15 NOTE — Telephone Encounter (Signed)
SPOKE TO PT WIFE REASSURE APPT ON 6-28  FOR LABS AND ECHO AND SAID THAT HAS TO BE RESCHEDULED DUE TO AN APPT SHE FORGOT ABOUT AND SAID  LIFE IS TOUGH RIGHT NOW SO FORGIVE HER MEMORY.  SHE WILL CONTACT [facility] SCHEDULERS FOR A  RESCHEDULE APPT FOR ECHO AND LABS

## 2016-12-16 ENCOUNTER — Other Ambulatory Visit: Payer: PPO

## 2016-12-16 ENCOUNTER — Other Ambulatory Visit (HOSPITAL_COMMUNITY): Payer: PPO

## 2016-12-16 DIAGNOSIS — L4059 Other psoriatic arthropathy: Secondary | ICD-10-CM | POA: Diagnosis not present

## 2016-12-16 DIAGNOSIS — E663 Overweight: Secondary | ICD-10-CM | POA: Diagnosis not present

## 2016-12-16 DIAGNOSIS — M545 Low back pain: Secondary | ICD-10-CM | POA: Diagnosis not present

## 2016-12-16 DIAGNOSIS — Z6828 Body mass index (BMI) 28.0-28.9, adult: Secondary | ICD-10-CM | POA: Diagnosis not present

## 2016-12-16 DIAGNOSIS — M5136 Other intervertebral disc degeneration, lumbar region: Secondary | ICD-10-CM | POA: Diagnosis not present

## 2016-12-16 DIAGNOSIS — M2550 Pain in unspecified joint: Secondary | ICD-10-CM | POA: Diagnosis not present

## 2016-12-17 ENCOUNTER — Telehealth: Payer: Self-pay | Admitting: Internal Medicine

## 2016-12-17 DIAGNOSIS — M961 Postlaminectomy syndrome, not elsewhere classified: Secondary | ICD-10-CM | POA: Diagnosis not present

## 2016-12-17 DIAGNOSIS — M1611 Unilateral primary osteoarthritis, right hip: Secondary | ICD-10-CM | POA: Diagnosis not present

## 2016-12-17 DIAGNOSIS — M5416 Radiculopathy, lumbar region: Secondary | ICD-10-CM | POA: Diagnosis not present

## 2016-12-17 DIAGNOSIS — M47816 Spondylosis without myelopathy or radiculopathy, lumbar region: Secondary | ICD-10-CM | POA: Diagnosis not present

## 2016-12-17 NOTE — Telephone Encounter (Signed)
New message     Pt wife is calling asking for a call back from RN. She did not say what it was but she would talk to RN.

## 2016-12-17 NOTE — Telephone Encounter (Signed)
Follow Up   Pt is calling back

## 2016-12-17 NOTE — Telephone Encounter (Signed)
Called, spoke with pt's wife, [name] (on [place]). Wife stated pt has been of Eliquis for 3 weeks (Eliquis on hold for TENS procedure). Pt has had 2 cancellations of this  Procedure. Wife stated she will call me back to discuss further, pt and wife are at the dr's office now.

## 2016-12-19 ENCOUNTER — Other Ambulatory Visit: Payer: Self-pay | Admitting: Family

## 2016-12-21 DIAGNOSIS — Z6828 Body mass index (BMI) 28.0-28.9, adult: Secondary | ICD-10-CM | POA: Diagnosis not present

## 2016-12-21 DIAGNOSIS — Z7901 Long term (current) use of anticoagulants: Secondary | ICD-10-CM | POA: Diagnosis not present

## 2016-12-21 DIAGNOSIS — I509 Heart failure, unspecified: Secondary | ICD-10-CM | POA: Diagnosis not present

## 2016-12-21 DIAGNOSIS — R600 Localized edema: Secondary | ICD-10-CM | POA: Diagnosis not present

## 2016-12-21 DIAGNOSIS — E440 Moderate protein-calorie malnutrition: Secondary | ICD-10-CM | POA: Diagnosis not present

## 2016-12-23 NOTE — Telephone Encounter (Signed)
Pt will wait to have TENS procedure until next month. Pt will start back on Eliquis.

## 2016-12-27 ENCOUNTER — Telehealth: Payer: Self-pay | Admitting: Internal Medicine

## 2016-12-27 ENCOUNTER — Other Ambulatory Visit: Payer: PPO

## 2016-12-27 ENCOUNTER — Ambulatory Visit (HOSPITAL_COMMUNITY): Payer: PPO | Attending: Cardiovascular Disease

## 2016-12-27 ENCOUNTER — Other Ambulatory Visit: Payer: Self-pay

## 2016-12-27 ENCOUNTER — Telehealth: Payer: Self-pay

## 2016-12-27 ENCOUNTER — Encounter (INDEPENDENT_AMBULATORY_CARE_PROVIDER_SITE_OTHER): Payer: Self-pay

## 2016-12-27 DIAGNOSIS — Z950 Presence of cardiac pacemaker: Secondary | ICD-10-CM | POA: Insufficient documentation

## 2016-12-27 DIAGNOSIS — I350 Nonrheumatic aortic (valve) stenosis: Secondary | ICD-10-CM | POA: Insufficient documentation

## 2016-12-27 DIAGNOSIS — I340 Nonrheumatic mitral (valve) insufficiency: Secondary | ICD-10-CM | POA: Diagnosis not present

## 2016-12-27 DIAGNOSIS — I110 Hypertensive heart disease with heart failure: Secondary | ICD-10-CM | POA: Insufficient documentation

## 2016-12-27 DIAGNOSIS — Z8673 Personal history of transient ischemic attack (TIA), and cerebral infarction without residual deficits: Secondary | ICD-10-CM | POA: Diagnosis not present

## 2016-12-27 DIAGNOSIS — I509 Heart failure, unspecified: Secondary | ICD-10-CM

## 2016-12-27 DIAGNOSIS — I4891 Unspecified atrial fibrillation: Secondary | ICD-10-CM | POA: Insufficient documentation

## 2016-12-27 NOTE — Telephone Encounter (Signed)
Spoke with pt and wife Louretta Parma) in the office (pt here for lab appt).  Pt reported did not take Lasix for 2 weeks because it caused too many trips to the bathroom. Pt stated het developed weeping edema. Pt called on call dr with PCP on Saturday. [name] stated PCP increased Lasix to 80 mg daily. [name] stated he accidently gave her husband 120 mg of Lasix for 3 days (7/1-7/3). [name] realized the the mistake on Tuesday (7/4). [name] stated she called pt's PCP to report and ask how long pt will be taking 80 mg daily. Nurse stated it could be forever, it is uncertain to know. Pt concerned if he needs additional lab work. Informed he is here today for a BMET, ordered by Tommye Standard, PA. This will show kidney function and electrolytes. Informed Dr. Lovena Le will review labs and give recommendation. Informed and gave a list of potassium rich food to eat. Pt and wife verbalized understanding and thanked me for speaking with them.

## 2016-12-27 NOTE — Telephone Encounter (Signed)
Mrs. Hippert (pt wife) calling, states that patient was on an "overdose of Lasix"  Saturday, Sunday and Monday 160 mg of Lasix. Mrs. Formosa requests that the labwork check whatever could have been affected by "Overdose" today.

## 2016-12-28 LAB — BASIC METABOLIC PANEL
BUN/Creatinine Ratio: 24 (ref 10–24)
BUN: 27 mg/dL (ref 8–27)
CHLORIDE: 105 mmol/L (ref 96–106)
CO2: 21 mmol/L (ref 20–29)
CREATININE: 1.12 mg/dL (ref 0.76–1.27)
Calcium: 9.4 mg/dL (ref 8.6–10.2)
GFR calc Af Amer: 71 mL/min/{1.73_m2} (ref >59)
GFR calc non Af Amer: 61 mL/min/{1.73_m2} (ref >59)
GLUCOSE: 106 mg/dL — AB (ref 65–99)
Potassium: 4.3 mmol/L (ref 3.5–5.2)
Sodium: 142 mmol/L (ref 134–144)

## 2016-12-29 ENCOUNTER — Other Ambulatory Visit: Payer: Self-pay | Admitting: Internal Medicine

## 2016-12-29 NOTE — Telephone Encounter (Signed)
Encourage patient not to miss his lasix. GT

## 2016-12-30 DIAGNOSIS — I4891 Unspecified atrial fibrillation: Secondary | ICD-10-CM | POA: Diagnosis not present

## 2016-12-30 DIAGNOSIS — Z8546 Personal history of malignant neoplasm of prostate: Secondary | ICD-10-CM | POA: Diagnosis not present

## 2016-12-30 DIAGNOSIS — R600 Localized edema: Secondary | ICD-10-CM | POA: Diagnosis not present

## 2016-12-30 DIAGNOSIS — M1990 Unspecified osteoarthritis, unspecified site: Secondary | ICD-10-CM | POA: Diagnosis not present

## 2016-12-30 DIAGNOSIS — Z96619 Presence of unspecified artificial shoulder joint: Secondary | ICD-10-CM | POA: Diagnosis not present

## 2016-12-30 DIAGNOSIS — I890 Lymphedema, not elsewhere classified: Secondary | ICD-10-CM | POA: Diagnosis not present

## 2016-12-30 DIAGNOSIS — R413 Other amnesia: Secondary | ICD-10-CM | POA: Diagnosis not present

## 2016-12-30 DIAGNOSIS — L97212 Non-pressure chronic ulcer of right calf with fat layer exposed: Secondary | ICD-10-CM | POA: Diagnosis not present

## 2016-12-30 DIAGNOSIS — E7800 Pure hypercholesterolemia, unspecified: Secondary | ICD-10-CM | POA: Diagnosis not present

## 2016-12-30 DIAGNOSIS — I6930 Unspecified sequelae of cerebral infarction: Secondary | ICD-10-CM | POA: Diagnosis not present

## 2016-12-30 DIAGNOSIS — Z950 Presence of cardiac pacemaker: Secondary | ICD-10-CM | POA: Diagnosis not present

## 2016-12-30 DIAGNOSIS — I872 Venous insufficiency (chronic) (peripheral): Secondary | ICD-10-CM | POA: Diagnosis not present

## 2016-12-31 DIAGNOSIS — M25561 Pain in right knee: Secondary | ICD-10-CM | POA: Diagnosis not present

## 2017-01-04 ENCOUNTER — Other Ambulatory Visit: Payer: Self-pay | Admitting: Neurology

## 2017-01-04 DIAGNOSIS — R300 Dysuria: Secondary | ICD-10-CM | POA: Diagnosis not present

## 2017-01-04 DIAGNOSIS — R310 Gross hematuria: Secondary | ICD-10-CM | POA: Diagnosis not present

## 2017-01-04 DIAGNOSIS — R828 Abnormal findings on cytological and histological examination of urine: Secondary | ICD-10-CM | POA: Diagnosis not present

## 2017-01-04 DIAGNOSIS — N3040 Irradiation cystitis without hematuria: Secondary | ICD-10-CM | POA: Diagnosis not present

## 2017-01-04 DIAGNOSIS — N3289 Other specified disorders of bladder: Secondary | ICD-10-CM | POA: Diagnosis not present

## 2017-01-04 DIAGNOSIS — Z8546 Personal history of malignant neoplasm of prostate: Secondary | ICD-10-CM | POA: Diagnosis not present

## 2017-01-05 DIAGNOSIS — R600 Localized edema: Secondary | ICD-10-CM | POA: Diagnosis not present

## 2017-01-05 DIAGNOSIS — Z7901 Long term (current) use of anticoagulants: Secondary | ICD-10-CM | POA: Diagnosis not present

## 2017-01-05 DIAGNOSIS — I890 Lymphedema, not elsewhere classified: Secondary | ICD-10-CM | POA: Diagnosis not present

## 2017-01-05 DIAGNOSIS — I872 Venous insufficiency (chronic) (peripheral): Secondary | ICD-10-CM | POA: Diagnosis not present

## 2017-01-05 DIAGNOSIS — I6930 Unspecified sequelae of cerebral infarction: Secondary | ICD-10-CM | POA: Diagnosis not present

## 2017-01-12 DIAGNOSIS — I872 Venous insufficiency (chronic) (peripheral): Secondary | ICD-10-CM | POA: Diagnosis not present

## 2017-01-12 DIAGNOSIS — R600 Localized edema: Secondary | ICD-10-CM | POA: Diagnosis not present

## 2017-01-12 DIAGNOSIS — I890 Lymphedema, not elsewhere classified: Secondary | ICD-10-CM | POA: Diagnosis not present

## 2017-01-12 DIAGNOSIS — I509 Heart failure, unspecified: Secondary | ICD-10-CM | POA: Diagnosis not present

## 2017-01-12 DIAGNOSIS — I6930 Unspecified sequelae of cerebral infarction: Secondary | ICD-10-CM | POA: Diagnosis not present

## 2017-01-12 DIAGNOSIS — L97211 Non-pressure chronic ulcer of right calf limited to breakdown of skin: Secondary | ICD-10-CM | POA: Diagnosis not present

## 2017-01-12 DIAGNOSIS — L97221 Non-pressure chronic ulcer of left calf limited to breakdown of skin: Secondary | ICD-10-CM | POA: Diagnosis not present

## 2017-01-12 NOTE — Progress Notes (Signed)
Cardiology Office Note Date:  01/12/2017  Patient ID:  Christopher Baker, Christopher Baker 12-Aug-1933, MRN [id number] PCP:  Haywood Pao, MD  Electrophysiologist: Dr. Lovena Le   Chief Complaint: echo results  History of Present Illness: Christopher Baker is a 83 y.o. male with history of symptomatic bradycardia w/PPM, unexplained syncope, HTN, CVA, PAFib, comes in today to be seen for Dr. Lovena Le.  Last seen by him in June 2017, at that time he noted some medication/dietary non-compliance, had symptoms of orthostasis and his ACE was reduced.    He was most recently seen by myself last month, he was accompanied by his wife who since his CVA keeps track of him, his medicines, though he is AAO x3, has short term memory issues.  His primary limiting and concerns are 3.  Most of all his severe and chronic back pain, this has almost become debilitating to him, ambulating with a walker 2/2 to it.  #2 is is urinary incontinence since his CVA and prostate surgery worse.  And 3 is his LE swelling.  It is described as chronic by record and patient/wife, while concerned, he has not taken his lasix at all in "months and months" because it aggravates his incontinence.  He denies any kind of SOB, no DOE, no nighttime symptoms of PND or orthopnea.  No CP, no dizziness, near syncope or syncope.  He comes in today again to be seen for Dr. Lovena Le, to discuss his echo results and management recommendations, his echo noted LVEF down to 20-25%, mild-nod AS (suspect worse given low EF), and evaluation of his response to lasix.  He comes in accompanied by his wife.  After his last visit he resisted the lasix for a couple weeks, until his PMD insisted that he get started and increased it to 79m which he says has been taking for about 2 weeks now.  Since his last visit he has been seen regarding his back pain with plans for an eventual implanted nerve stimulator.  Initially they need to place temporoary wires to ensure he will respond to  the therapy prior to implant, for this he eeded to be off his Eliquis a week (was cleared by Dr.Taylor), he did this and then the case cancelled due a trip/fall accident with new acute pain.  Rescheduled, remained ff his Eliquis and again ended up cancelled.  Plans to reschedule though concerns about so long off a/c, planned to reschedule for 01/28/17, to hold Eliquis 01/21/17.  Unfortunately then developed acute and sever cystitis associated with bloody henaturia/clots and unable to control, required him to again come off his Eliquis, only resuming yesterday.  Symptom-wise, his back remains his limitation.  It is difficult to know his exertional acpacity from a cardiac standpoint.  He is unaware of his AF, no palpitations, no rest SOB, no symptoms of PND or orthopnea, no CP, no near syncope or syncope.  His wife did not start him on the metoprolol, explaining that her mother is near death and this has been consuming, her husband's care has become increasingly complicated and could not recall why he needed another medicine and just wanted to wait for tiday to revisit this.  So far no hematuria after a day of Eliquis, no bleeding otherwise.   Device information: MDT dual chamber PPM implanted 06/05/10, Dr. TLovena Le  Past Medical History:  Diagnosis Date  . [place]thritis [place]   . Borderline diabetes    DIET CONTROLLED - PT STATES HE IS NOT  DIABETIC  . Cancer [facility])    prostate  2008  . Carotid stenosis, bilateral PER DR EARLY / DUPLEX  09-09-10     40 - 59%   BILATERALLY--- ASYMPTOMATIC  . Cataract immature LEFT EYE  . Dysrhythmia    a-fib  . GERD (gastroesophageal reflux disease)    CONTROLLED W/ PROTONIX  . History of prostate cancer S/P RADIATION  TX  6 YRS AGO  . HTN (hypertension)   . Hyperlipidemia   . Iron deficiency   . Lumbar spondylosis W/ RADICULOPATHY  . PAF (paroxysmal atrial fibrillation) ([place])   . Restless leg syndrome   . Sleep apnea TESTED YRS AGO-- NO CPAP RX  GIVEN   PT STATES HE DOES NOT THINK HE STILL HAS SLEEP APNEA--DOES NOT USE CPAP  . Status post placement of cardiac pacemaker DDD-  06-04-10-- LAST CHECK 09-08-10 IN EPIC   SECONDARY TO SYNCOPY AND BRADYCARDIA  . Stroke [facility]) Oct. 14, 2014   light left hand, balance issues, short term memory loss 2014  . SVT (supraventricular tachycardia) ([place])    S/P ABLATION   2008    Past Surgical History:  Procedure Laterality Date  . BACK SURGERY    . CARDIAC ELECTROPHYSIOLOGY STUDY AND ABLATION  2008   FOR SVT  . CARDIAC PACEMAKER PLACEMENT  06-04-10   DDD/ AND REMOVAL LOOR RECORDER  . CHOLECYSTECTOMY  2009  . ESOPHAGOGASTRODUODENOSCOPY N/A 12/12/2014   Procedure: ESOPHAGOGASTRODUODENOSCOPY (EGD);  Surgeon: Wilford Corner, MD;  Location: [facility] ENDOSCOPY;  Service: Endoscopy;  Laterality: N/A;  . INGUINAL HERNIA REPAIR  2005   LEFT/   DONE WITH PENILE PROSTESIS SURG.  . JOINT REPLACEMENT    . KNEE ARTHROSCOPY  06/02/2011   Procedure: ARTHROSCOPY KNEE;  Surgeon: Gearlean Alf;  Location: [place];  Service: Orthopedics;  Laterality: Left;  LEFT KNEE ARTHROSCOPY WITH DEBRIDEMENT  . LOOP RECORDER PLACEMENT  01-30-10  . LUMBAR LAMINECTOMY/ DISKECTOMY/ FUSION  05-19-10   L4 - 5  . PENILE PROSTHESIS IMPLANT  1995  . PROSTATE BIOPSY  2007  . REMOVAL AND PLACEMENT PENILE PROSTHESIS IMPLANT   2005   AND LEFT CORPOROPLASTY /    DONE INGUINAL REPAIR  . REPLACEMENT TOTAL KNEE  1997   RIGHT  . SPINE SURGERY    . TOTAL KNEE ARTHROPLASTY  12/20/2011   Procedure: TOTAL KNEE ARTHROPLASTY;  Surgeon: Gearlean Alf, MD;  Location: [facility];  Service: Orthopedics;  Laterality: Left;  . TOTAL SHOULDER ARTHROPLASTY Right 06/27/2015   Procedure: REVERSE TOTAL SHOULDER ARTHROPLASTY;  Surgeon: Netta Cedars, MD;  Location: [place];  Service: Orthopedics;  Laterality: Right;  . TRANSURETHRAL RESECTION OF PROSTATE  2006    Current Outpatient Prescriptions  Medication Sig Dispense Refill  . amoxicillin  (AMOXIL) 500 MG capsule     . atorvastatin (LIPITOR) 20 MG tablet Take 20 mg by mouth daily.    . Cyanocobalamin (B-12) 1000 MCG/ML KIT Inject as directed.    . donepezil (ARICEPT) 10 MG tablet Take 1 tablet (10 mg total) by mouth at bedtime. 30 tablet 3  . ELIQUIS 5 MG TABS tablet TAKE 1 TABLET (5 MG TOTAL) BY MOUTH 2 (TWO) TIMES DAILY. 60 tablet 5  . ezetimibe (ZETIA) 10 MG tablet Take 1 tablet (10 mg total) by mouth daily. 90 tablet 1  . folic acid (FOLVITE) 1 MG tablet Take 1 mg by mouth daily.    . furosemide (LASIX) 20 MG tablet Take 2 tablets (40 mg total) by mouth  daily. 60 tablet 9  . IRON PO Take 65 mg by mouth daily.    Marland Kitchen lisinopril (PRINIVIL,ZESTRIL) 5 MG tablet TAKE ONE TABLET BY MOUTH DAILY 90 tablet 3  . methotrexate (RHEUMATREX) 2.5 MG tablet Take 4 tablets (10 mg total) by mouth once a week. Caution:Chemotherapy. Protect from light. Every monday 4 tablet 1  . metoprolol succinate (TOPROL XL) 25 MG 24 hr tablet Take 1 tablet (25 mg total) by mouth daily. 90 tablet 1  . Misc. Devices (ENDURANCE FOUR LEG SEAT CANE) MISC 1 each by Does not apply route as directed. 1 each 0  . Misc. Devices (STEP N REST WALKER) MISC Use as directed 1 each 0  . oxybutynin (DITROPAN) 5 MG tablet Take 5 mg by mouth 2 (two) times daily.     . pantoprazole (PROTONIX) 40 MG tablet Take 1 tablet (40 mg total) by mouth daily. 90 tablet 3  . predniSONE (DELTASONE) 5 MG tablet Take 1.5 tablets (7.5 mg total) by mouth daily. 135 tablet 0  . sertraline (ZOLOFT) 25 MG tablet TAKE TWO TABLETS BY MOUTH DAILY 60 tablet 1  . traMADol (ULTRAM) 50 MG tablet     . Vitamin D, Ergocalciferol, (DRISDOL) 50000 units CAPS capsule Take 50,000 Units by mouth every 7 (seven) days.     No current facility-administered medications for this visit.     Allergies:   Ciprofloxacin; Hydrocodone-acetaminophen; and Other   Social History:  The patient  reports that he quit smoking about 18 years ago. His smoking use included  Cigarettes. He has a 5.50 pack-year smoking history. He has never used smokeless tobacco. He reports that he drinks about 3.0 oz of alcohol per week . He reports that he does not use drugs.   Family History:  The patient's family history includes Early death in his father; Stroke in his mother.  ROS:  Please see the history of present illness.   All other systems are reviewed and otherwise negative.   PHYSICAL EXAM:  VS:  There were no vitals taken for this visit. BMI: There is no height or weight on file to calculate BMI. Well nourished, well developed, in no acute distress  HEENT: normocephalic, atraumatic  Neck: no JVD, carotid bruits or masses Cardiac:  IRRR; 2/6 SM, no rubs, or gallops Lungs:  CTA b/l, no wheezing, rhonchi or rales  Abd: soft, nontender MS: no deformity, age appropriate atrophy Ext: He is wearing compression wraps, edema is noted though much improved, 1+  Skin: warm and dry, no rash Neuro:  No gross deficits appreciated Psych: euthymic mood, full affect  PPM site is stable, no tethering or discomfort   EKG:  Done today and reviewed by myself shows SR, marked 1st degree AVblock, PR 472m,ST/T changes, appears similar to his last, more pronounced PPM interrogation done today by industry and reviewed by myself: battery is stable lead measuremenets are OK, R waves decreased from last visit impedence and threshold stable, he is in AFib, appers to have been since mid April, rate controlled, V pacing % is increased to about 50%.  One HVR, is NSVT 5 seconds.  Programmed DDD paced AV 1848m sensed AV 16062ms discussed previously with Dr. TayLovena Le 12/27/16: TTE Study Conclusions - Left ventricle: The cavity size was mildly dilated. Wall   thickness was normal. Systolic function was severely reduced. The   estimated ejection fraction was in the range of 20% to 25%.   Severe diffuse hypokinesis with no identifiable regional  variations. - Ventricular septum: Septal  motion showed abnormal function,   dyssynergy, and paradox. These changes are consistent with   intraventricular conduction delay. - Aortic valve: Transvalvular velocity was increased less than   expected, due to low cardiac output. There was mild to moderate   stenosis. Valve area (VTI): 1.37 cm^2. Valve area (Vmax): 1.45   cm^2. Valve area (Vmean): 1.36 cm^2. - Mitral valve: There was mild regurgitation. - Left atrium: The atrium was moderately dilated. - Right atrium: The atrium was mildly dilated. Impressions: - Compared to 2016 images, there has been a marked reduction in   left ventricular systolic function.   10/10/14: TTE Study Conclusions - Left ventricle: The cavity size was at the upper limits of   normal. Wall thickness was normal. The estimated ejection   fraction was 50%. Diffuse hypokinesis. Doppler parameters are   consistent with abnormal left ventricular relaxation (grade 1   diastolic dysfunction). - Aortic valve: Trileaflet; moderately calcified leaflets. There   was moderate stenosis. Mean gradient (S): 24 mm Hg. Valve area   (VTI): 1.2 cm^2. - Mitral valve: There was mild regurgitation. - Left atrium: The atrium was mildly dilated. - Right ventricle: The cavity size was normal. Pacer wire or   catheter noted in right ventricle. Systolic function was normal. - Tricuspid valve: Peak RV-RA gradient (S): 26 mm Hg. - Pulmonary arteries: PA peak pressure: 29 mm Hg (S). - Inferior vena cava: The vessel was normal in size. The   respirophasic diameter changes were in the normal range (>= 50%),   consistent with normal central venous pressure. Impressions: - Borderline dilated LV with EF 50%, mild diffuse hypokinesis.   Normal RV size and systolic function. Moderate aortic stenosis.   Mild MR.   Recent Labs: 11/25/2016: Magnesium 2.3 12/27/2016: BUN 27; Creatinine, Ser 1.12; Potassium 4.3; Sodium 142  No results found for requested labs within last 8760 hours.    CrCl cannot be calculated (Unknown ideal weight.).   Wt Readings from Last 3 Encounters:  11/25/16 220 lb (99.8 kg)  09/23/16 230 lb 2 oz (104.4 kg)  04/08/16 222 lb 3 oz (100.8 kg)     Other studies reviewed: Additional studies/records reviewed today include: summarized above  ASSESSMENT AND PLAN:  1. CHF, new CM     Case/echo reviewed with Dr. Lovena Le, recommended pacer reprogramming as above, medical management and follow echo, no cath for now    His edema is markedly improved, but has 1+ remaining, weight is down 22 lbs by his home scale    BMETtoday for further recommendations on his lasix, continue 53m daily for now    Start the Toprol 268mdaily     2. Paroxysmal AFib     CHA2DS2Vasc is at least 4, on Eliquis     Unfortunately he has been in AF for about a month, and OFF his Eliquis 3 of the last 4 weeks, only back on yesterday     Rate is controlled  3. PPM     They prefer in-clinic pacer check to remotes, discussed benefits of remote monitoring but they do not want to and prefer coming in.      Device function is intact      4. HTN     Looks OK today  5. SM on exam, of AS     Likely underestimated given low EF  6.  NSVT       Start BB as previously instructed  Disposition: Again his primary  physical limitation is his back, he is at this time scheduled for 1st step towards nerve stimulator implant.  He will need to again stop his Eliquis, I am concerned about this having been off for the last 3 weeks.  They tell me the ultimate surgery will eventually be an in-patient procedure with general anesthesia, I have suggested given his ne CM and acute CHF (though much improved) I would want to defer to Dr. Lovena Le his recommendations regarding surgery timing and risk assessment.   In regards to his AF, he is asymptomatic, and has been off his a/c, rate controlled, no plans for now for rhythm control, also discussed would need to have uninterrupted a/c afterwards for a month,  and would  make sure his hematuria does not re-occur before committing.  I will have him back in 3 weeks, will discuss with Dr. Lovena Le, the case as well.  The patient's wife will touch base mid week next week if we have not contacted her regarding 8/10 procedure.  Current medicines are reviewed at length with the patient today.  The patient did not have any concerns regarding medicines.  Haywood Lasso, PA-C 01/12/2017 5:53 AM     [address] [place] [address] [phone] (office)  [phone] (fax)

## 2017-01-13 ENCOUNTER — Ambulatory Visit (INDEPENDENT_AMBULATORY_CARE_PROVIDER_SITE_OTHER): Payer: PPO | Admitting: Physician Assistant

## 2017-01-13 DIAGNOSIS — I428 Other cardiomyopathies: Secondary | ICD-10-CM | POA: Diagnosis not present

## 2017-01-13 DIAGNOSIS — Z79899 Other long term (current) drug therapy: Secondary | ICD-10-CM | POA: Diagnosis not present

## 2017-01-13 DIAGNOSIS — I4729 Other ventricular tachycardia: Secondary | ICD-10-CM

## 2017-01-13 DIAGNOSIS — I4891 Unspecified atrial fibrillation: Secondary | ICD-10-CM

## 2017-01-13 DIAGNOSIS — I481 Persistent atrial fibrillation: Secondary | ICD-10-CM | POA: Diagnosis not present

## 2017-01-13 DIAGNOSIS — I472 Ventricular tachycardia: Secondary | ICD-10-CM

## 2017-01-13 DIAGNOSIS — I5023 Acute on chronic systolic (congestive) heart failure: Secondary | ICD-10-CM

## 2017-01-13 DIAGNOSIS — I350 Nonrheumatic aortic (valve) stenosis: Secondary | ICD-10-CM | POA: Diagnosis not present

## 2017-01-13 DIAGNOSIS — Z950 Presence of cardiac pacemaker: Secondary | ICD-10-CM

## 2017-01-13 DIAGNOSIS — I4819 Other persistent atrial fibrillation: Secondary | ICD-10-CM

## 2017-01-13 LAB — BASIC METABOLIC PANEL
BUN/Creatinine Ratio: 24 (ref 10–24)
BUN: 27 mg/dL (ref 8–27)
CO2: 22 mmol/L (ref 20–29)
CREATININE: 1.12 mg/dL (ref 0.76–1.27)
Calcium: 10.1 mg/dL (ref 8.6–10.2)
Chloride: 107 mmol/L — ABNORMAL HIGH (ref 96–106)
GFR, EST AFRICAN AMERICAN: 71 mL/min/{1.73_m2} (ref >59)
GFR, EST NON AFRICAN AMERICAN: 61 mL/min/{1.73_m2} (ref >59)
GLUCOSE: 97 mg/dL (ref 65–99)
Potassium: 4.7 mmol/L (ref 3.5–5.2)
SODIUM: 143 mmol/L (ref 134–144)

## 2017-01-13 NOTE — Patient Instructions (Addendum)
Medication Instructions:   Your physician recommends that you continue on your current medications as directed. Please refer to the Current Medication list given to you today.   If you need a refill on your cardiac medications before your next appointment, please call your pharmacy.  Labwork: NONE ORDERED  TODAY    Testing/Procedures: NONE ORDERED  TODAY    Follow-Up: IN ONE MONTH WITH TAYLOR OR URSUY ON SAME DAY AS TAYLOR PER URSUY   Any Other Special Instructions Will Be Listed Below (If Applicable).

## 2017-01-20 ENCOUNTER — Telehealth: Payer: Self-pay | Admitting: *Deleted

## 2017-01-20 NOTE — Telephone Encounter (Signed)
SPOKE TO PT PER URUSY AND TAYLOR FOR PT TO HOLD ELIQUIS 3 DAYS PRIOR TO SURGERY.  PT STATED THAT HIS SURGERY WAS SCHEDULED FOR TOMORROW BUT WILL CONTACT SURGEON OFFICE  SO THAT THEY'LL KNOW HIS MEDICATION INSTRUCTIONS.

## 2017-01-28 ENCOUNTER — Other Ambulatory Visit: Payer: Self-pay | Admitting: Neurology

## 2017-02-01 ENCOUNTER — Other Ambulatory Visit: Payer: Self-pay | Admitting: Neurology

## 2017-02-02 ENCOUNTER — Encounter: Payer: Self-pay | Admitting: Physician Assistant

## 2017-02-04 DIAGNOSIS — M961 Postlaminectomy syndrome, not elsewhere classified: Secondary | ICD-10-CM | POA: Diagnosis not present

## 2017-02-04 DIAGNOSIS — M5416 Radiculopathy, lumbar region: Secondary | ICD-10-CM | POA: Diagnosis not present

## 2017-02-11 ENCOUNTER — Encounter: Payer: Self-pay | Admitting: Internal Medicine

## 2017-02-11 ENCOUNTER — Ambulatory Visit (INDEPENDENT_AMBULATORY_CARE_PROVIDER_SITE_OTHER): Payer: PPO | Admitting: Internal Medicine

## 2017-02-11 VITALS — BP 122/68 | HR 79 | Ht 75.5 in | Wt 217.6 lb

## 2017-02-11 DIAGNOSIS — I428 Other cardiomyopathies: Secondary | ICD-10-CM

## 2017-02-11 DIAGNOSIS — I509 Heart failure, unspecified: Secondary | ICD-10-CM | POA: Diagnosis not present

## 2017-02-11 DIAGNOSIS — I480 Paroxysmal atrial fibrillation: Secondary | ICD-10-CM | POA: Diagnosis not present

## 2017-02-11 DIAGNOSIS — R001 Bradycardia, unspecified: Secondary | ICD-10-CM | POA: Diagnosis not present

## 2017-02-11 MED ORDER — FUROSEMIDE 40 MG PO TABS: 40.0000 mg | ORAL_TABLET | Freq: Every day | ORAL | 11 refills | Status: DC

## 2017-02-11 NOTE — Patient Instructions (Addendum)
Medication Instructions:  Your physician has recommended you make the following change in your medication: j 1.  Take furosemide (Lasix) 40 mg (one tablet) by mouth daily.  You may take an extra tablet as needed for swelling.    Labwork: None ordered.  Testing/Procedures: None ordered.  Follow-Up:  You will be contacted by the A-fib clinic on Monday to come in for an appointment that same day February 14, 2017.  Then you will be admitted to [facility] for Tikosyn load.  Follow up with the device clinic in 6 months.     Any Other Special Instructions Will Be Listed Below (If Applicable).   If you need a refill on your cardiac medications before your next appointment, please call your pharmacy.

## 2017-02-11 NOTE — Progress Notes (Signed)
HPI Mr. Czaja returns today for follow-up of his atrial fibrillation and symptomatic sinus node dysfunction. He is an 83 year old man with a history of paroxysmal atrial fibrillation, syncope, sinus node dysfunction, status post pacemaker insertion, remote stroke, who has had worsening problems with back pain and is considering a spinal stimulator. He is also had some peripheral edema recently and newly diagnosed worsening LV dysfunction. The patient has been in atrial fibrillation for several months. His ventricular rate is controlled and he is not particularly dyspneic. His surgeons have instructed him to have his "atrial fibrillation taking care of" before having any spinal procedures done. He has not had syncope recently. Allergies  Allergen Reactions  . Ciprofloxacin Nausea Only    REACTION: GI upset  . Hydrocodone-Acetaminophen     Hallucinations in higher doses  . Other Other (See Comments)    SCALLOPS--SUDDEN GI ATTACK  . Shellfish Allergy     Sudden GI attack     Current Outpatient Prescriptions  Medication Sig Dispense Refill  . acetaminophen (TYLENOL) 500 MG tablet Take 1,000 mg by mouth 2 (two) times daily.    Marland Kitchen amoxicillin (AMOXIL) 500 MG capsule as directed. For dental appts    . atorvastatin (LIPITOR) 20 MG tablet Take 20 mg by mouth daily.    . Cyanocobalamin (B-12) 1000 MCG/ML KIT Inject as directed.    . donepezil (ARICEPT) 10 MG tablet TAKE ONE TABLET BY MOUTH EVERY NIGHT AT BEDTIME 90 tablet 2  . ELIQUIS 5 MG TABS tablet TAKE 1 TABLET (5 MG TOTAL) BY MOUTH 2 (TWO) TIMES DAILY. 60 tablet 5  . ezetimibe (ZETIA) 10 MG tablet Take 1 tablet (10 mg total) by mouth daily. 90 tablet 1  . folic acid (FOLVITE) 1 MG tablet Take 1 mg by mouth daily.    . IRON PO Take 65 mg by mouth daily.    Marland Kitchen lisinopril (PRINIVIL,ZESTRIL) 5 MG tablet TAKE ONE TABLET BY MOUTH DAILY 90 tablet 3  . methotrexate (RHEUMATREX) 2.5 MG tablet Take 4 tablets (10 mg total) by mouth once a week.  Caution:Chemotherapy. Protect from light. Every monday 4 tablet 1  . metoprolol succinate (TOPROL XL) 25 MG 24 hr tablet Take 1 tablet (25 mg total) by mouth daily. 90 tablet 1  . Misc. Devices (STEP N REST WALKER) MISC Use as directed 1 each 0  . oxybutynin (DITROPAN) 5 MG tablet Take 5 mg by mouth 2 (two) times daily.     . pantoprazole (PROTONIX) 40 MG tablet Take 1 tablet (40 mg total) by mouth daily. 90 tablet 3  . predniSONE (DELTASONE) 5 MG tablet Take 5 mg by mouth daily.    . sertraline (ZOLOFT) 25 MG tablet TAKE TWO TABLETS BY MOUTH DAILY 60 tablet 2  . Vitamin D, Ergocalciferol, (DRISDOL) 50000 units CAPS capsule Take 50,000 Units by mouth every 7 (seven) days.    . furosemide (LASIX) 40 MG tablet Take 1 tablet (40 mg total) by mouth daily. Take an extra 40 mg daily as needed for swelling 45 tablet 11   No current facility-administered medications for this visit.      Past Medical History:  Diagnosis Date  . Arthritis with psoriasis ([place])   . Borderline diabetes    DIET CONTROLLED - PT STATES HE IS NOT DIABETIC  . Cancer [facility])    prostate  2008  . Carotid stenosis, bilateral PER DR EARLY / DUPLEX  09-09-10     40 - 59%   BILATERALLY---  ASYMPTOMATIC  . Cataract immature LEFT EYE  . Dysrhythmia    a-fib  . GERD (gastroesophageal reflux disease)    CONTROLLED W/ PROTONIX  . History of prostate cancer S/P RADIATION  TX  6 YRS AGO  . HTN (hypertension)   . Hyperlipidemia   . Iron deficiency   . Lumbar spondylosis W/ RADICULOPATHY  . PAF (paroxysmal atrial fibrillation) ([place])   . Restless leg syndrome   . Sleep apnea TESTED YRS AGO-- NO CPAP RX GIVEN   PT STATES HE DOES NOT THINK HE STILL HAS SLEEP APNEA--DOES NOT USE CPAP  . Status post placement of cardiac pacemaker DDD-  06-04-10-- LAST CHECK 09-08-10 IN EPIC   SECONDARY TO SYNCOPY AND BRADYCARDIA  . Stroke [facility]) Oct. 14, 2014   light left hand, balance issues, short term memory loss 2014  . SVT (supraventricular  tachycardia) ([place])    S/P ABLATION   2008    ROS:   All systems reviewed and negative except as noted in the HPI.   Past Surgical History:  Procedure Laterality Date  . BACK SURGERY    . CARDIAC ELECTROPHYSIOLOGY STUDY AND ABLATION  2008   FOR SVT  . CARDIAC PACEMAKER PLACEMENT  06-04-10   DDD/ AND REMOVAL LOOR RECORDER  . CHOLECYSTECTOMY  2009  . ESOPHAGOGASTRODUODENOSCOPY N/A 12/12/2014   Procedure: ESOPHAGOGASTRODUODENOSCOPY (EGD);  Surgeon: Wilford Corner, MD;  Location: [facility] ENDOSCOPY;  Service: Endoscopy;  Laterality: N/A;  . INGUINAL HERNIA REPAIR  2005   LEFT/   DONE WITH PENILE PROSTESIS SURG.  . JOINT REPLACEMENT    . KNEE ARTHROSCOPY  06/02/2011   Procedure: ARTHROSCOPY KNEE;  Surgeon: Gearlean Alf;  Location: [place];  Service: Orthopedics;  Laterality: Left;  LEFT KNEE ARTHROSCOPY WITH DEBRIDEMENT  . LOOP RECORDER PLACEMENT  01-30-10  . LUMBAR LAMINECTOMY/ DISKECTOMY/ FUSION  05-19-10   L4 - 5  . PENILE PROSTHESIS IMPLANT  1995  . PROSTATE BIOPSY  2007  . REMOVAL AND PLACEMENT PENILE PROSTHESIS IMPLANT   2005   AND LEFT CORPOROPLASTY /    DONE INGUINAL REPAIR  . REPLACEMENT TOTAL KNEE  1997   RIGHT  . SPINE SURGERY    . TOTAL KNEE ARTHROPLASTY  12/20/2011   Procedure: TOTAL KNEE ARTHROPLASTY;  Surgeon: Gearlean Alf, MD;  [facility];  Service: Orthopedics;  Laterality: Left;  . TOTAL SHOULDER ARTHROPLASTY Right 06/27/2015   Procedure: REVERSE TOTAL SHOULDER ARTHROPLASTY;  Surgeon: Netta Cedars, MD;  Location: [place];  Service: Orthopedics;  Laterality: Right;  . TRANSURETHRAL RESECTION OF PROSTATE  2006     Family History  Problem Relation Age of Onset  . Stroke Mother   . Early death Father      Social History   Social History  . Marital status: Married    Spouse name: N/A  . Number of children: N/A  . Years of education: N/A   Occupational History  . retired    Social History Main Topics  . Smoking status: Former  Smoker    Packs/day: 0.10    Years: 55.00    Types: Cigarettes    Quit date: 12/07/1998  . Smokeless tobacco: Never Used  . Alcohol use 3.0 oz/week    5 Shots of liquor per week     Comment: gin and tonic  . Drug use: No  . Sexual activity: Yes    Partners: Female   Other Topics Concern  . Not on file   Social History Narrative  .  No narrative on file     BP 122/68   Pulse 79   Ht 6' 3.5" (1.918 m)   Wt 217 lb 9.6 oz (98.7 kg)   SpO2 97%   BMI 26.84 kg/m   Physical Exam:  stable appearing 83 year old man, NAD HEENT: Unremarkable Neck:  7 cm JVD, no thyromegally Lymphatics:  No adenopathy Back:  No CVA tenderness Lungs:  Clear, with no wheezes, rales, or rhonchi. HEART:  Regular rate rhythm, no murmurs, no rubs, no clicks Abd:  soft, positive bowel sounds, no organomegally, no rebound, no guarding Ext:  2 plus pulses, trace peripheral edema, no cyanosis, no clubbing Skin:  No rashes no nodules Neuro:  CN II through XII intact, motor grossly intact   DEVICE  Normal device function.  See PaceArt for details. Underlying rhythm is atrial fibrillation  Assess/Plan: 1. Persistent atrial fibrillation - I discussed the treatment options with the patient and his wife in detail. He will be admitted to the hospital early next week for initiation of dofetilide. I would anticipate that he remain on systemic anticoagulation for approximately 4 weeks after return to sinus rhythm if possible, before considering spinal stimulator surgery. 2. Chronic systolic heart failure - his EF appears to have worsened however he does not have worsening heart failure symptoms at this time. He will continue his current medications including his diuretic therapy. I would anticipate up titration of his medical therapy as tolerated in the future. He is encouraged to maintain a low-sodium diet. He has reduced his Lasix back to 40 mg a day. 3. Permanent pacemaker - his Medtronic dual-chamber pacemaker is  working normally. We'll plan to recheck in several months. 4. Hypertensive heart disease - his blood pressure is well controlled. No change in medical therapy.  Cristopher Peru, M.D.

## 2017-02-14 ENCOUNTER — Telehealth: Payer: Self-pay | Admitting: Internal Medicine

## 2017-02-14 ENCOUNTER — Telehealth: Payer: Self-pay | Admitting: Pharmacist

## 2017-02-14 LAB — CUP PACEART INCLINIC DEVICE CHECK
Battery Impedance: 616 Ohm
Brady Statistic AP VP Percent: 40 %
Brady Statistic AS VP Percent: 32 %
Brady Statistic AS VS Percent: 28 %
Date Time Interrogation Session: 20180824162610
Implantable Lead Implant Date: 20111215
Implantable Lead Location: 753860
Implantable Lead Model: 4076
Implantable Lead Model: 4076
Lead Channel Impedance Value: 378 Ohm
Lead Channel Pacing Threshold Amplitude: 1.25 V
Lead Channel Pacing Threshold Pulse Width: 0.46 ms
Lead Channel Sensing Intrinsic Amplitude: 0.7 mV
Lead Channel Sensing Intrinsic Amplitude: 5.6 mV
Lead Channel Setting Pacing Pulse Width: 0.46 ms
MDC IDC LEAD IMPLANT DT: 20111215
MDC IDC LEAD LOCATION: 753859
MDC IDC MSMT BATTERY REMAINING LONGEVITY: 65 mo
MDC IDC MSMT BATTERY VOLTAGE: 2.78 V
MDC IDC MSMT LEADCHNL RA IMPEDANCE VALUE: 399 Ohm
MDC IDC MSMT LEADCHNL RV PACING THRESHOLD AMPLITUDE: 1.125 V
MDC IDC MSMT LEADCHNL RV PACING THRESHOLD PULSEWIDTH: 0.4 ms
MDC IDC PG IMPLANT DT: 20111215
MDC IDC SET LEADCHNL RA PACING AMPLITUDE: 2 V
MDC IDC SET LEADCHNL RV PACING AMPLITUDE: 2.5 V
MDC IDC SET LEADCHNL RV SENSING SENSITIVITY: 2 mV
MDC IDC STAT BRADY AP VS PERCENT: 1 %

## 2017-02-14 NOTE — Telephone Encounter (Addendum)
Talked with patients wife - pending tikosyn admission - pt has not checked with insurance for cost of drug which they are already in the doughnut hole for eliquis. Insurance has not been notified of admission nor are there any appointment available today for [place] admission. Patient's wife will talk with insurance company and be back in touch once they know if tikosyn will be affordable or if we need to look at a different treatment plan.

## 2017-02-14 NOTE — Telephone Encounter (Signed)
Patient missed a dose of Eliquis last week - would prefer to avoid TEE prior to admission. Admission is scheduled for 9/10 per pt request. Tikosyn is affordable for patient.

## 2017-02-14 NOTE — Telephone Encounter (Signed)
Patient wife calling, states that she was supposed to hear back from Dr. Tanna Furry office in regards to a procedure scheduled for today.

## 2017-02-14 NOTE — Telephone Encounter (Signed)
Medication list reviewed in anticipation of upcoming Tikosyn initiation. Patient is not taking any contraindicated medications, although both Aricept and Zoloft are QTc prolonging medications. Unlikely that pt would be able to change Aricept to an alternative, but would recommend that pt f/u with PCP to see if he is able to switch to another antidepressant. Duloxetine does not cause QTc prolongation and avoids an interaction with Tikosyn. Patient is anticoagulated on Eliquis on the appropriate dose. Mg and K both at goal within the past few months.  Patient missed a dose of Eliquis last week - to be admitted 9/10 for Tikosyn to ensure 3 weeks of uninterrupted anticoagulation prior to Tikosyn load. Patient will need to be counseled to avoid use of Benadryl while on Tikosyn and in the 2-3 days prior to Tikosyn initiation.

## 2017-02-15 ENCOUNTER — Telehealth: Payer: Self-pay

## 2017-02-15 NOTE — Telephone Encounter (Signed)
I have filled out a coverage determination request form for Eliquis that was faxed to the office from [place]. Awaiting response.

## 2017-02-15 NOTE — Telephone Encounter (Signed)
Received a denial on the pts coverage determination for Eliquis from [place].  I have completed a redetermination form and have faxed it back to [facility].

## 2017-02-16 ENCOUNTER — Telehealth: Payer: Self-pay | Admitting: *Deleted

## 2017-02-16 NOTE — Telephone Encounter (Signed)
[employer] coverage determination form for TIKOSYN 500 MCG CAPSULES. Note from pharmacist states patient will be admitted for tikosyn load on 02/28/2017 per Dr Lovena Le. I have completed forms and will get his signature.

## 2017-02-16 NOTE — Telephone Encounter (Signed)
Completed coverage determination forms for patients TIKOSYN done with Dr Forde Dandy signature and faxed to [place].

## 2017-02-22 NOTE — Telephone Encounter (Signed)
**Note De-Identified  Obfuscation** The pt has received an Approval on the coverage determination request for Tikosyn from [employer]. Approval good from 02/16/2017 until 06/20/2017.

## 2017-02-24 ENCOUNTER — Ambulatory Visit: Payer: PPO | Admitting: Physician Assistant

## 2017-02-28 ENCOUNTER — Ambulatory Visit (HOSPITAL_COMMUNITY)
Admission: RE | Admit: 2017-02-28 | Discharge: 2017-02-28 | Disposition: A | Discharge status: Home / Self Care | Payer: PPO | Source: Ambulatory Visit | Attending: Nurse Practitioner | Admitting: Nurse Practitioner

## 2017-02-28 ENCOUNTER — Encounter (HOSPITAL_COMMUNITY): Payer: Self-pay | Admitting: General Practice

## 2017-02-28 ENCOUNTER — Inpatient Hospital Stay (HOSPITAL_COMMUNITY)
Admission: AD | Admit: 2017-02-28 | Discharge: 2017-03-03 | DRG: 309 | Disposition: A | Discharge status: Home / Self Care | Payer: PPO | Source: Ambulatory Visit | Attending: Internal Medicine | Admitting: Internal Medicine

## 2017-02-28 ENCOUNTER — Encounter (HOSPITAL_COMMUNITY): Payer: Self-pay | Admitting: Nurse Practitioner

## 2017-02-28 VITALS — BP 110/56 | HR 64 | Ht 75.5 in | Wt 219.4 lb

## 2017-02-28 DIAGNOSIS — Z8546 Personal history of malignant neoplasm of prostate: Secondary | ICD-10-CM | POA: Diagnosis not present

## 2017-02-28 DIAGNOSIS — Z8673 Personal history of transient ischemic attack (TIA), and cerebral infarction without residual deficits: Secondary | ICD-10-CM | POA: Diagnosis not present

## 2017-02-28 DIAGNOSIS — I639 Cerebral infarction, unspecified: Secondary | ICD-10-CM | POA: Diagnosis not present

## 2017-02-28 DIAGNOSIS — Z7901 Long term (current) use of anticoagulants: Secondary | ICD-10-CM | POA: Diagnosis not present

## 2017-02-28 DIAGNOSIS — E785 Hyperlipidemia, unspecified: Secondary | ICD-10-CM | POA: Diagnosis not present

## 2017-02-28 DIAGNOSIS — M549 Dorsalgia, unspecified: Secondary | ICD-10-CM | POA: Diagnosis not present

## 2017-02-28 DIAGNOSIS — I429 Cardiomyopathy, unspecified: Secondary | ICD-10-CM | POA: Diagnosis not present

## 2017-02-28 DIAGNOSIS — I5022 Chronic systolic (congestive) heart failure: Secondary | ICD-10-CM | POA: Diagnosis present

## 2017-02-28 DIAGNOSIS — K219 Gastro-esophageal reflux disease without esophagitis: Secondary | ICD-10-CM | POA: Diagnosis not present

## 2017-02-28 DIAGNOSIS — Z96653 Presence of artificial knee joint, bilateral: Secondary | ICD-10-CM | POA: Diagnosis not present

## 2017-02-28 DIAGNOSIS — I110 Hypertensive heart disease with heart failure: Secondary | ICD-10-CM | POA: Diagnosis not present

## 2017-02-28 DIAGNOSIS — I255 Ischemic cardiomyopathy: Secondary | ICD-10-CM | POA: Diagnosis not present

## 2017-02-28 DIAGNOSIS — I10 Essential (primary) hypertension: Secondary | ICD-10-CM | POA: Diagnosis not present

## 2017-02-28 DIAGNOSIS — Z923 Personal history of irradiation: Secondary | ICD-10-CM

## 2017-02-28 DIAGNOSIS — D649 Anemia, unspecified: Secondary | ICD-10-CM | POA: Diagnosis not present

## 2017-02-28 DIAGNOSIS — F329 Major depressive disorder, single episode, unspecified: Secondary | ICD-10-CM | POA: Diagnosis present

## 2017-02-28 DIAGNOSIS — Z7189 Other specified counseling: Secondary | ICD-10-CM

## 2017-02-28 DIAGNOSIS — Z7952 Long term (current) use of systemic steroids: Secondary | ICD-10-CM | POA: Diagnosis not present

## 2017-02-28 DIAGNOSIS — Z981 Arthrodesis status: Secondary | ICD-10-CM

## 2017-02-28 DIAGNOSIS — I481 Persistent atrial fibrillation: Principal | ICD-10-CM

## 2017-02-28 DIAGNOSIS — Z96611 Presence of right artificial shoulder joint: Secondary | ICD-10-CM | POA: Diagnosis present

## 2017-02-28 DIAGNOSIS — Z823 Family history of stroke: Secondary | ICD-10-CM

## 2017-02-28 DIAGNOSIS — I4819 Other persistent atrial fibrillation: Secondary | ICD-10-CM

## 2017-02-28 DIAGNOSIS — Z79899 Other long term (current) drug therapy: Secondary | ICD-10-CM | POA: Diagnosis not present

## 2017-02-28 DIAGNOSIS — I4891 Unspecified atrial fibrillation: Secondary | ICD-10-CM | POA: Diagnosis not present

## 2017-02-28 DIAGNOSIS — Z87891 Personal history of nicotine dependence: Secondary | ICD-10-CM

## 2017-02-28 DIAGNOSIS — Z23 Encounter for immunization: Secondary | ICD-10-CM

## 2017-02-28 DIAGNOSIS — G4733 Obstructive sleep apnea (adult) (pediatric): Secondary | ICD-10-CM | POA: Diagnosis not present

## 2017-02-28 HISTORY — DX: Dependence on other enabling machines and devices: Z99.89

## 2017-02-28 HISTORY — DX: Other chronic pain: G89.29

## 2017-02-28 HISTORY — DX: Anxiety disorder, unspecified: F41.9

## 2017-02-28 HISTORY — DX: Personal history of other diseases of the musculoskeletal system and connective tissue: Z87.39

## 2017-02-28 HISTORY — DX: Low back pain: M54.5

## 2017-02-28 HISTORY — DX: Major depressive disorder, single episode, unspecified: F32.9

## 2017-02-28 HISTORY — DX: Obstructive sleep apnea (adult) (pediatric): G47.33

## 2017-02-28 HISTORY — DX: Presence of cardiac pacemaker: Z95.0

## 2017-02-28 HISTORY — DX: Unspecified osteoarthritis, unspecified site: M19.90

## 2017-02-28 HISTORY — DX: Low back pain, unspecified: M54.50

## 2017-02-28 HISTORY — DX: Depression, unspecified: F32.A

## 2017-02-28 LAB — BASIC METABOLIC PANEL
Anion gap: 6 (ref 5–15)
BUN: 24 mg/dL — AB (ref 6–20)
CALCIUM: 9.5 mg/dL (ref 8.9–10.3)
CO2: 26 mmol/L (ref 22–32)
CREATININE: 1.3 mg/dL — AB (ref 0.61–1.24)
Chloride: 109 mmol/L (ref 101–111)
GFR calc non Af Amer: 50 mL/min — ABNORMAL LOW (ref >60)
GFR, EST AFRICAN AMERICAN: 58 mL/min — AB (ref >60)
Glucose, Bld: 110 mg/dL — ABNORMAL HIGH (ref 65–99)
Potassium: 4.1 mmol/L (ref 3.5–5.1)
SODIUM: 141 mmol/L (ref 135–145)

## 2017-02-28 LAB — MAGNESIUM: Magnesium: 2.3 mg/dL (ref 1.7–2.4)

## 2017-02-28 MED ORDER — PANTOPRAZOLE SODIUM 40 MG PO TBEC
40.0000 mg | DELAYED_RELEASE_TABLET | Freq: Every day | ORAL | Status: DC
  Administered 2017-03-01 – 2017-03-03 (×3): 40 mg via ORAL
  Filled 2017-02-28 (×3): qty 1

## 2017-02-28 MED ORDER — METOPROLOL SUCCINATE ER 25 MG PO TB24
25.0000 mg | ORAL_TABLET | Freq: Every day | ORAL | Status: DC
  Administered 2017-03-01 – 2017-03-03 (×3): 25 mg via ORAL
  Filled 2017-02-28 (×3): qty 1

## 2017-02-28 MED ORDER — VITAMIN D (ERGOCALCIFEROL) 1.25 MG (50000 UNIT) PO CAPS
50000.0000 [IU] | ORAL_CAPSULE | ORAL | Status: DC
  Filled 2017-02-28 (×2): qty 1

## 2017-02-28 MED ORDER — DOFETILIDE 500 MCG PO CAPS
500.0000 ug | ORAL_CAPSULE | Freq: Two times a day (BID) | ORAL | Status: DC
  Administered 2017-02-28 – 2017-03-02 (×5): 500 ug via ORAL
  Filled 2017-02-28 (×5): qty 1

## 2017-02-28 MED ORDER — SODIUM CHLORIDE 0.9 % IV SOLN: 250.0000 mL | INTRAVENOUS | Status: DC | PRN

## 2017-02-28 MED ORDER — OXYBUTYNIN CHLORIDE 5 MG PO TABS
5.0000 mg | ORAL_TABLET | Freq: Two times a day (BID) | ORAL | Status: DC
  Administered 2017-02-28 – 2017-03-03 (×6): 5 mg via ORAL
  Filled 2017-02-28 (×6): qty 1

## 2017-02-28 MED ORDER — APIXABAN 5 MG PO TABS
5.0000 mg | ORAL_TABLET | Freq: Two times a day (BID) | ORAL | Status: DC
  Administered 2017-02-28 – 2017-03-03 (×6): 5 mg via ORAL
  Filled 2017-02-28 (×6): qty 1

## 2017-02-28 MED ORDER — FUROSEMIDE 40 MG PO TABS
40.0000 mg | ORAL_TABLET | Freq: Every day | ORAL | Status: DC
  Administered 2017-03-01 – 2017-03-03 (×3): 40 mg via ORAL
  Filled 2017-02-28 (×3): qty 1

## 2017-02-28 MED ORDER — ACETAMINOPHEN 325 MG PO TABS
650.0000 mg | ORAL_TABLET | Freq: Two times a day (BID) | ORAL | Status: DC | PRN
  Administered 2017-02-28 – 2017-03-03 (×4): 650 mg via ORAL
  Filled 2017-02-28 (×4): qty 2

## 2017-02-28 MED ORDER — ACETAMINOPHEN 500 MG PO TABS: 1000.0000 mg | ORAL_TABLET | Freq: Two times a day (BID) | ORAL | Status: DC

## 2017-02-28 MED ORDER — EZETIMIBE 10 MG PO TABS
10.0000 mg | ORAL_TABLET | Freq: Every day | ORAL | Status: DC
  Administered 2017-03-01 – 2017-03-03 (×3): 10 mg via ORAL
  Filled 2017-02-28 (×3): qty 1

## 2017-02-28 MED ORDER — DULOXETINE HCL 30 MG PO CPEP
30.0000 mg | ORAL_CAPSULE | Freq: Every day | ORAL | Status: DC
  Administered 2017-03-01 – 2017-03-03 (×3): 30 mg via ORAL
  Filled 2017-02-28 (×3): qty 1

## 2017-02-28 MED ORDER — ATORVASTATIN CALCIUM 20 MG PO TABS
20.0000 mg | ORAL_TABLET | Freq: Every day | ORAL | Status: DC
  Administered 2017-03-01 – 2017-03-03 (×3): 20 mg via ORAL
  Filled 2017-02-28 (×3): qty 1

## 2017-02-28 MED ORDER — PREDNISONE 5 MG PO TABS
5.0000 mg | ORAL_TABLET | Freq: Every day | ORAL | Status: DC
  Administered 2017-03-01 – 2017-03-03 (×3): 5 mg via ORAL
  Filled 2017-02-28 (×3): qty 1

## 2017-02-28 MED ORDER — SODIUM CHLORIDE 0.9% FLUSH: 3.0000 mL | INTRAVENOUS | Status: DC | PRN

## 2017-02-28 MED ORDER — SODIUM CHLORIDE 0.9% FLUSH
3.0000 mL | Freq: Two times a day (BID) | INTRAVENOUS | Status: DC
  Administered 2017-03-01 – 2017-03-03 (×4): 3 mL via INTRAVENOUS

## 2017-02-28 MED ORDER — DONEPEZIL HCL 10 MG PO TABS
10.0000 mg | ORAL_TABLET | Freq: Every day | ORAL | Status: DC
  Administered 2017-02-28 – 2017-03-02 (×3): 10 mg via ORAL
  Filled 2017-02-28 (×3): qty 1

## 2017-02-28 MED ORDER — LISINOPRIL 5 MG PO TABS
5.0000 mg | ORAL_TABLET | Freq: Every day | ORAL | Status: DC
  Administered 2017-03-01 – 2017-03-03 (×3): 5 mg via ORAL
  Filled 2017-02-28 (×3): qty 1

## 2017-02-28 MED ORDER — FOLIC ACID 1 MG PO TABS
1.0000 mg | ORAL_TABLET | Freq: Every day | ORAL | Status: DC
  Administered 2017-03-01 – 2017-03-03 (×3): 1 mg via ORAL
  Filled 2017-02-28 (×3): qty 1

## 2017-02-28 NOTE — H&P (Signed)
H&P   Patient ID: [name]; [id number]; 06-10-1934   Admit date: (Not on file) Date of Consult: 02/28/2017  Primary Care Provider: Haywood Pao, MD Primary Electrophysiologist:  Dr. Lovena Le   Patient Profile:   Christopher Baker is a 83 y.o. male with a hx of symptomatic bradycardia w/PPM. Syncope, HTN, CVA, severe chronic back pain, urinary incontinence s/p CVA,  Paroxysmal Afib, presumed NICM, VHD, with at least mod AS, depression,  who is being admitetd today for Tikosyn initiation  History of Present Illness:   [name] was referred to the AFib clinic by dr. Lovena Le to arrange admission for Tikosyn initiation.  He was seen today by D. Kayleen Memos, NP who noted the patient was originally planned for 3 weeks ago but had missed one dose of Eliquis, and was rescheduled for today, she reported the patient denied any missed doses in the last 3 weeks.   His meds were screened by PharmD and it was noted that he is on Aricept and Zoloft and she suggested to try to get pt off Zoloft as there were no alternatives for Aricept. He continued on Zoloft but Roderic Palau, NP reported talking to to Dr. Tomi Likens, who started drug 4 years ago after he had depression 2/2 stroke. Dr. Tomi Likens was OK to stop the drug as of this am and start duloxetine (cymbalta ) 30 mg to start tomorrow am. In d/w Roderic Palau, NP, this was discussed with [facility] PharmD and felt to be acceptable.  The patient/wife confirms no missed Eliquis doses in 3 weeks, the pt made aware of planned DCCV Wed if not in SR (risks and benefits were discussed and he is agreeable to proceed if needed), the patient is aware of potential QT prolongation and arrhythmia risks, need for hospitalization for initiation, he wants to proceed.   Device information: MDT dual chamber PPM implanted 06/05/10, Dr. Lovena Le  Past Medical History:  Diagnosis Date  . Arthritis with psoriasis ([place])   . Borderline diabetes    DIET CONTROLLED - PT STATES HE IS  NOT DIABETIC  . Cancer [facility])    prostate  2008  . Carotid stenosis, bilateral PER DR EARLY / DUPLEX  09-09-10     40 - 59%   BILATERALLY--- ASYMPTOMATIC  . Cataract immature LEFT EYE  . Dysrhythmia    a-fib  . GERD (gastroesophageal reflux disease)    CONTROLLED W/ PROTONIX  . History of prostate cancer S/P RADIATION  [place]  6 YRS AGO  . HTN (hypertension)   . Hyperlipidemia   . Iron deficiency   . Lumbar spondylosis W/ RADICULOPATHY  . PAF (paroxysmal atrial fibrillation) ([place])   . Restless leg syndrome   . Sleep apnea TESTED YRS AGO-- NO CPAP RX GIVEN   PT STATES HE DOES NOT THINK HE STILL HAS SLEEP APNEA--DOES NOT USE CPAP  . Status post placement of cardiac pacemaker DDD-  06-04-10-- LAST CHECK 09-08-10 IN EPIC   SECONDARY TO SYNCOPY AND BRADYCARDIA  . Stroke [facility]) Oct. 14, 2014   light left hand, balance issues, short term memory loss 2014  . SVT (supraventricular tachycardia) ([place])    S/P ABLATION   2008    Past Surgical History:  Procedure Laterality Date  . BACK SURGERY    . CARDIAC ELECTROPHYSIOLOGY STUDY AND ABLATION  2008   FOR SVT  . CARDIAC PACEMAKER PLACEMENT  06-04-10   DDD/ AND REMOVAL LOOR RECORDER  . CHOLECYSTECTOMY  2009  . ESOPHAGOGASTRODUODENOSCOPY N/A 12/12/2014  Procedure: ESOPHAGOGASTRODUODENOSCOPY (EGD);  Surgeon: Wilford Corner, MD;  Location[facility] ENDOSCOPY;  Service: Endoscopy;  Laterality: N/A;  . INGUINAL HERNIA REPAIR  2005   LEFT/   DONE WITH PENILE PROSTESIS SURG.  . JOINT REPLACEMENT    . KNEE ARTHROSCOPY  06/02/2011   Procedure: ARTHROSCOPY KNEE;  Surgeon: Gearlean Alf;  Location: [place];  Service: Orthopedics;  Laterality: Left;  LEFT KNEE ARTHROSCOPY WITH DEBRIDEMENT  . LOOP RECORDER PLACEMENT  01-30-10  . LUMBAR LAMINECTOMY/ DISKECTOMY/ FUSION  05-19-10   L4 - 5  . PENILE PROSTHESIS IMPLANT  1995  . PROSTATE BIOPSY  2007  . REMOVAL AND PLACEMENT PENILE PROSTHESIS IMPLANT   2005   AND LEFT CORPOROPLASTY /    DONE  INGUINAL REPAIR  . REPLACEMENT TOTAL KNEE  1997   RIGHT  . SPINE SURGERY    . TOTAL KNEE ARTHROPLASTY  12/20/2011   Procedure: TOTAL KNEE ARTHROPLASTY;  Surgeon: Gearlean Alf, MD;  Location: [facility];  Service: Orthopedics;  Laterality: Left;  . TOTAL SHOULDER ARTHROPLASTY Right 06/27/2015   Procedure: REVERSE TOTAL SHOULDER ARTHROPLASTY;  Surgeon: Netta Cedars, MD;  Location: [place];  Service: Orthopedics;  Laterality: Right;  . TRANSURETHRAL RESECTION OF PROSTATE  2006       Inpatient Medications: Scheduled Meds:  Continuous Infusions:  PRN Meds:   Allergies:    Allergies  Allergen Reactions  . Ciprofloxacin Nausea Only    REACTION: GI upset  . Hydrocodone-Acetaminophen     Hallucinations in higher doses  . Other Other (See Comments)    SCALLOPS--SUDDEN GI ATTACK  . Shellfish Allergy     Sudden GI attack    Social History:   Social History   Social History  . Marital status: Married    Spouse name: N/A  . Number of children: N/A  . Years of education: N/A   Occupational History  . retired    Social History Main Topics  . Smoking status: Former Smoker    Packs/day: 0.10    Years: 55.00    Types: Cigarettes    Quit date: 12/07/1998  . Smokeless tobacco: Never Used  . Alcohol use 3.0 oz/week    5 Shots of liquor per week     Comment: gin and tonic  . Drug use: No  . Sexual activity: Yes    Partners: Female   Other Topics Concern  . Not on file   Social History Narrative  . No narrative on file    Family History:   Family History  Problem Relation Age of Onset  . Stroke Mother   . Early death Father      ROS:  Please see the history of present illness.  ROS  All other ROS reviewed and negative.     Physical Exam/Data:   BP (!) 143/54 (BP Location: Left Arm)   Pulse 64   Ht 6\' 3"  (1.905 m)   Wt 219 lb 7 oz (99.5 kg)   SpO2 100%   BMI 27.43 kg/m   General:  Well nourished, well developed, in no acute distress HEENT: normal Lymph: no  adenopathy Neck: no JVD Endocrine:  No thryomegaly Vascular: No carotid bruits; FA pulses 2+ bilaterally without bruits  Cardiac:   RRR; 2/6 SM Lungs: CTA b/l, no wheezing, rhonchi or rales  Abd: soft, nontender Ext: 2+ edema Musculoskeletal:  No deformities, BUE and BLE strength normal and equal Skin: warm and dry  Neuro:  CNs 2-12 intact, no focal abnormalities  noted Psych:  Normal affect   EKG:  Today's EKG was reviewed with Dr. Caryl Comes demonstrates:   AF, V paced, QT is acceptable given paced QRS  11/25/16 EKG in SR/not paced also reviewed with Dr. Caryl Comes, QT also acceptable Telemetry:  Telemetry was personally reviewed and demonstrates:  AF, V paced  Relevant CV Studies:  12/27/16: TTE Study Conclusions - Left ventricle: The cavity size was mildly dilated. Wall thickness was normal. Systolic function was severely reduced. The estimated ejection fraction was in the range of 20% to 25%. Severe diffuse hypokinesis with no identifiable regional variations. - Ventricular septum: Septal motion showed abnormal function, dyssynergy, and paradox. These changes are consistent with intraventricular conduction delay. - Aortic valve: Transvalvular velocity was increased less than expected, due to low cardiac output. There was mild to moderate stenosis. Valve area (VTI): 1.37 cm^2. Valve area (Vmax): 1.45 cm^2. Valve area (Vmean): 1.36 cm^2. - Mitral valve: There was mild regurgitation. - Left atrium: The atrium was moderately dilated. - Right atrium: The atrium was mildly dilated. Impressions: - Compared to 2016 images, there has been a marked reduction in left ventricular systolic function.   Laboratory Data:  Chemistry Recent Labs Lab 02/28/17 0945  NA 141  K 4.1  CL 109  CO2 26  GLUCOSE 110*  BUN 24*  CREATININE 1.30*  CALCIUM 9.5  GFRNONAA 50*  GFRAA 58*  ANIONGAP 6    No results for input(s): PROT, ALBUMIN, AST, ALT, ALKPHOS, BILITOT in the last  168 hours. HematologyNo results for input(s): WBC, RBC, HGB, HCT, MCV, MCH, MCHC, RDW, PLT in the last 168 hours. Cardiac EnzymesNo results for input(s): TROPONINI in the last 168 hours. No results for input(s): TROPIPOC in the last 168 hours.  BNPNo results for input(s): BNP, PROBNP in the last 168 hours.  DDimer No results for input(s): DDIMER in the last 168 hours.  Radiology/Studies:  No results found.  Assessment and Plan:   1. persistent AFib, Tikosyn initiation     CHA2DS2Vasc is at least 6, on Eliquis, appropriately dosed at 5mg  BID     Creat 1.30 (cal Cr. Cl = 63)     K+ 4.1     Mag 2.3     EKGs are reviewed with Dr. Caryl Comes and OK to proceed     Plan for 527mcg BID  2. CM  EF 25%  Mild to mod AS     fluid status, chronically edematous     Continue home lasix and follow (didnt take yet today)  3. HTN     Looks OK  4. Chronic severe back pain     Tylenol as needed, is what he uses at home  5. OSA, compliance with CPAP      The patient's wife will bring his home machine  6. Depression     Stopping zoloft >> Cymbalata as per out patient notes/plan     For questions or updates, please contact [place] Please consult [email or web address] for contact info under Cardiology/STEMI. Daytime calls, contact the Day Call APP (6a-8a) or assigned team (Teams A-D) provider (7:30a - 5p). All other daytime calls (7:30-5p), contact the Card Master @ [phone].   Nighttime calls, contact the assigned APP (5p-8p) or MD (6:30p-8p). Overnight calls (8p-6a), contact the on call Fellow @ [phone].   Signed, Tommye Standard, PA-C Seen and agree Admitted for dofetilide for atrial fibrillation LA size is enlarged ( 45/1.9/42) LV severely depressed   He may be a candidate for entresto will defer  to Dr Elliot Cousin  Is he candidate for carvedilol?

## 2017-02-28 NOTE — Progress Notes (Signed)
Primary Care Physician: Tisovec, Fransico Him, MD Referring Physician: Dr. Avie Echevaria is a 83 y.o. male with a h/o symptomatic bradycardia w/PPM, unexplained syncope, HTN, CVA, persistent afib with EF of 20-25%, chronic LLE , comes in today to be seen for Dr. Lovena Le to be admitted for [place]. This was originally planned for 3 weeks ago but had missed one dose of eliquis. He is here today stating no missed doses. Meds were screened by PharmD and it was noted that he is on Aricept and Zoloft and she suggested to try to get pt off Zoloft as there were no alternatives for Aricept. He continues on Zoloft but I did talk to to Dr. Tomi Likens, who started drug 4 years ago after he had depression 2/2 stroke. Dr. Tomi Likens was OK to stop the drug as of this am and start duloxetine (cymbalta ) 30 mg to start in am.   Today, he denies symptoms of palpitations, chest pain, shortness of breath, orthopnea, PND,  dizziness, presyncope, syncope, or neurologic sequela. The patient is tolerating medications without difficulties and is otherwise without complaint today.   Past Medical History:  Diagnosis Date  . Arthritis with psoriasis ([place])   . Borderline diabetes    DIET CONTROLLED - PT STATES HE IS NOT DIABETIC  . Cancer [facility])    prostate  2008  . Carotid stenosis, bilateral PER DR EARLY / DUPLEX  09-09-10     40 - 59%   BILATERALLY--- ASYMPTOMATIC  . Cataract immature LEFT EYE  . Dysrhythmia    a-fib  . GERD (gastroesophageal reflux disease)    CONTROLLED W/ PROTONIX  . History of prostate cancer S/P RADIATION  [place]  6 YRS AGO  . HTN (hypertension)   . Hyperlipidemia   . Iron deficiency   . Lumbar spondylosis W/ RADICULOPATHY  . PAF (paroxysmal atrial fibrillation) ([place])   . Restless leg syndrome   . Sleep apnea TESTED YRS AGO-- NO CPAP RX GIVEN   PT STATES HE DOES NOT THINK HE STILL HAS SLEEP APNEA--DOES NOT USE CPAP  . Status post placement of cardiac pacemaker DDD-  06-04-10-- LAST CHECK  09-08-10 IN EPIC   SECONDARY TO SYNCOPY AND BRADYCARDIA  . Stroke [facility]) Oct. 14, 2014   light left hand, balance issues, short term memory loss 2014  . SVT (supraventricular tachycardia) ([place])    S/P ABLATION   2008   Past Surgical History:  Procedure Laterality Date  . BACK SURGERY    . CARDIAC ELECTROPHYSIOLOGY STUDY AND ABLATION  2008   FOR SVT  . CARDIAC PACEMAKER PLACEMENT  06-04-10   DDD/ AND REMOVAL LOOR RECORDER  . CHOLECYSTECTOMY  2009  . ESOPHAGOGASTRODUODENOSCOPY N/A 12/12/2014   Procedure: ESOPHAGOGASTRODUODENOSCOPY (EGD);  Surgeon: Wilford Corner, MD;  Location: [facility] ENDOSCOPY;  Service: Endoscopy;  Laterality: N/A;  . INGUINAL HERNIA REPAIR  2005   LEFT/   DONE WITH PENILE PROSTESIS SURG.  . JOINT REPLACEMENT    . KNEE ARTHROSCOPY  06/02/2011   Procedure: ARTHROSCOPY KNEE;  Surgeon: Gearlean Alf;  Location: [place];  Service: Orthopedics;  Laterality: Left;  LEFT KNEE ARTHROSCOPY WITH DEBRIDEMENT  . LOOP RECORDER PLACEMENT  01-30-10  . LUMBAR LAMINECTOMY/ DISKECTOMY/ FUSION  05-19-10   L4 - 5  . PENILE PROSTHESIS IMPLANT  1995  . PROSTATE BIOPSY  2007  . REMOVAL AND PLACEMENT PENILE PROSTHESIS IMPLANT   2005   AND LEFT CORPOROPLASTY /    DONE INGUINAL REPAIR  .  REPLACEMENT TOTAL KNEE  1997   RIGHT  . SPINE SURGERY    . TOTAL KNEE ARTHROPLASTY  12/20/2011   Procedure: TOTAL KNEE ARTHROPLASTY;  Surgeon: Gearlean Alf, MD;  Location: [facility];  Service: Orthopedics;  Laterality: Left;  . TOTAL SHOULDER ARTHROPLASTY Right 06/27/2015   Procedure: REVERSE TOTAL SHOULDER ARTHROPLASTY;  Surgeon: Netta Cedars, MD;  Location: [place];  Service: Orthopedics;  Laterality: Right;  . TRANSURETHRAL RESECTION OF PROSTATE  2006    Current Outpatient Prescriptions  Medication Sig Dispense Refill  . acetaminophen (TYLENOL) 500 MG tablet Take 1,000 mg by mouth 2 (two) times daily.    Marland Kitchen amoxicillin (AMOXIL) 500 MG capsule as directed. For dental appts    .  atorvastatin (LIPITOR) 20 MG tablet Take 20 mg by mouth daily.    . Cyanocobalamin (B-12) 1000 MCG/ML KIT Inject as directed.    . donepezil (ARICEPT) 10 MG tablet TAKE ONE TABLET BY MOUTH EVERY NIGHT AT BEDTIME 90 tablet 2  . ELIQUIS 5 MG TABS tablet TAKE 1 TABLET (5 MG TOTAL) BY MOUTH 2 (TWO) TIMES DAILY. 60 tablet 5  . ezetimibe (ZETIA) 10 MG tablet Take 1 tablet (10 mg total) by mouth daily. 90 tablet 1  . folic acid (FOLVITE) 1 MG tablet Take 1 mg by mouth daily.    . furosemide (LASIX) 20 MG tablet Take 20 mg by mouth daily as needed. 2-3 daily    . IRON PO Take 65 mg by mouth daily.    Marland Kitchen lisinopril (PRINIVIL,ZESTRIL) 5 MG tablet TAKE ONE TABLET BY MOUTH DAILY 90 tablet 3  . methotrexate (RHEUMATREX) 2.5 MG tablet Take 4 tablets (10 mg total) by mouth once a week. Caution:Chemotherapy. Protect from light. Every monday 4 tablet 1  . metoprolol succinate (TOPROL XL) 25 MG 24 hr tablet Take 1 tablet (25 mg total) by mouth daily. 90 tablet 1  . Misc. Devices (STEP N REST WALKER) MISC Use as directed 1 each 0  . oxybutynin (DITROPAN) 5 MG tablet Take 5 mg by mouth 2 (two) times daily.     . pantoprazole (PROTONIX) 40 MG tablet Take 1 tablet (40 mg total) by mouth daily. 90 tablet 3  . predniSONE (DELTASONE) 5 MG tablet Take 5 mg by mouth daily.    . sertraline (ZOLOFT) 25 MG tablet TAKE TWO TABLETS BY MOUTH DAILY 60 tablet 2  . Vitamin D, Ergocalciferol, (DRISDOL) 50000 units CAPS capsule Take 50,000 Units by mouth every 7 (seven) days.     No current facility-administered medications for this encounter.     Allergies  Allergen Reactions  . Ciprofloxacin Nausea Only    REACTION: GI upset  . Hydrocodone-Acetaminophen     Hallucinations in higher doses  . Other Other (See Comments)    SCALLOPS--SUDDEN GI ATTACK  . Shellfish Allergy     Sudden GI attack    Social History   Social History  . Marital status: Married    Spouse name: N/A  . Number of children: N/A  . Years of  education: N/A   Occupational History  . retired    Social History Main Topics  . Smoking status: Former Smoker    Packs/day: 0.10    Years: 55.00    Types: Cigarettes    Quit date: 12/07/1998  . Smokeless tobacco: Never Used  . Alcohol use 3.0 oz/week    5 Shots of liquor per week     Comment: gin and tonic  . Drug use: No  .  Sexual activity: Yes    Partners: Female   Other Topics Concern  . Not on file   Social History Narrative  . No narrative on file    Family History  Problem Relation Age of Onset  . Stroke Mother   . Early death Father     ROS- All systems are reviewed and negative except as per the HPI above  Physical Exam: Vitals:   02/28/17 0955  BP: (!) 110/56  Pulse: 64  Weight: 219 lb 6.4 oz (99.5 kg)  Height: 6' 3.5" (1.918 m)   Wt Readings from Last 3 Encounters:  02/28/17 219 lb 6.4 oz (99.5 kg)  02/11/17 217 lb 9.6 oz (98.7 kg)  01/13/17 211 lb (95.7 kg)    Labs: Lab Results  Component Value Date   NA 141 02/28/2017   K 4.1 02/28/2017   CL 109 02/28/2017   CO2 26 02/28/2017   GLUCOSE 110 (H) 02/28/2017   BUN 24 (H) 02/28/2017   CREATININE 1.30 (H) 02/28/2017   CALCIUM 9.5 02/28/2017   MG 2.3 02/28/2017   Lab Results  Component Value Date   INR 1.01 06/24/2015   Lab Results  Component Value Date   CHOL 146 06/19/2014   HDL 55 06/19/2014   LDLCALC 78 06/19/2014   TRIG 64 06/19/2014     GEN- The patient is well appearing, alert and oriented x 3 today.   Head- normocephalic, atraumatic Eyes-  Sclera clear, conjunctiva pink Ears- hearing intact Oropharynx- clear Neck- supple, no JVP Lymph- no cervical lymphadenopathy Lungs- Clear to ausculation bilaterally, normal work of breathing Heart- Regular rate and rhythm,(paced) no murmurs, rubs or gallops, PMI not laterally displaced GI- soft, NT, ND, + BS Extremities- no clubbing, cyanosis, or edema MS- no significant deformity or atrophy Skin- no rash or lesion Psych- euthymic  mood, full affect Neuro- strength and sensation are intact  EKG-v paced 64 bpm, qrs int 182 ms, qtc 503 ms Ekg in afib(not paced) with Dr. Lovena Le qtc 445 ms    Assessment and Plan: 1. Persistent afib For tikosyn admission General precautions re taikosyn Can afford drug Labs with K/mag at good levels, at 4.1/ 2.3 respectfully,  crcl al at 62.73 Per recommendation from PharmD and discussion with Dr. Tomi Likens, can stop  Zoloft, for potential of qt prolongation with tikosyn, last dose of 25 mg this am and can start Cymbalta 30 mg in am Has not had any further missed doses of eliquis x 3 weeks Qtc is prolonged with v pacing but acceptable when in afib at 445 ms when last seen by Dr. Lovena Le Pt to be admitted when bed available  Butch Penny C. Myalynn Lingle, [facility] [address] [place], [place] [address] [phone]

## 2017-02-28 NOTE — Progress Notes (Signed)
Pharmacy Review for Dofetilide (Tikosyn) Initiation  Admit Complaint: 83 y.o. male admitted 02/28/2017 with atrial fibrillation to be initiated on dofetilide.   Assessment:  Patient Exclusion Criteria: If any screening criteria checked as "Yes", then  patient  should NOT receive dofetilide until criteria item is corrected. If "Yes" please indicate correction plan.  YES  NO Patient  Exclusion Criteria Correction Plan  [x]  []  Baseline QTc interval is greater than or equal to 440 msec. IF above YES box checked dofetilide contraindicated unless patient has ICD; then may proceed if QTc 500-550 msec or with known ventricular conduction abnormalities may proceed with QTc 550-600 msec. QTc = 503 (paced) -per MD note: Qtc is prolonged with v pacing but acceptable when in afib at 445 ms   []  [x]  Magnesium level is less than 1.8 mEq/l : Last magnesium: 2.3 Lab Results  Component Value Date   MG 2.3 02/28/2017         []  []  Potassium level is less than 4 mEq/l : Last potassium: 4.1 Lab Results  Component Value Date   K 4.1 02/28/2017         []  [x]  Patient is known or suspected to have a digoxin level greater than 2 ng/ml: No results found for: DIGOXIN    []  [x]  Creatinine clearance less than 20 ml/min (calculated using Cockcroft-Gault, actual body weight and serum creatinine): CrCl ~ 60-65   [x]  []  Patient has received drugs known to prolong the QT intervals within the last 48 hours (phenothiazines, tricyclics or tetracyclic antidepressants, erythromycin, H-1 antihistamines, cisapride, fluoroquinolones, azithromycin). Drugs not listed above may have an, as yet, undetected potential to prolong the QT interval, updated information on QT prolonging agents is available at this website:QT prolonging agents -Aricept- no good alternative  -Sertraline recently Changed to duloxetine  []  [x]  Patient received a dose of hydrochlorothiazide (Oretic) alone or in any combination including triamterene  (Dyazide, Maxzide) in the last 48 hours.   []  [x]  Patient received a medication known to increase dofetilide plasma concentrations prior to initial dofetilide dose:  . Trimethoprim (Primsol, Proloprim) in the last 36 hours . Verapamil (Calan, Verelan) in the last 36 hours or a sustained release dose in the last 72 hours . Megestrol (Megace) in the last 5 days  . Cimetidine (Tagamet) in the last 6 hours . Ketoconazole (Nizoral) in the last 24 hours . Itraconazole (Sporanox) in the last 48 hours  . Prochlorperazine (Compazine) in the last 36 hours    []  [x]  Patient is known to have a history of torsades de pointes; congenital or acquired long QT syndromes.   []  [x]  Patient has received a Class 1 antiarrhythmic with less than 2 half-lives since last dose. (Disopyramide, Quinidine, Procainamide, Lidocaine, Mexiletine, Flecainide, Propafenone)   []  [x]  Patient has received amiodarone therapy in the past 3 months or amiodarone level is greater than 0.3 ng/ml.    Patient has been appropriately anticoagulated with Apixaban.  Ordering provider was confirmed at [email or web address] if they are not listed on the [place] Prescribers list.  Goal of Therapy: Follow renal function, electrolytes, potential drug interactions, and dose adjustment. Provide education and 1 week supply at discharge.  Plan:  [x]   Physician selected initial dose within range recommended for patients level of renal function - will monitor for response.  []   Physician selected initial dose outside of range recommended for patients level of renal function - will discuss if the dose should be altered at this time.   Select  One Calculated CrCl  Dose q12h  [x]  > 60 ml/min 500 mcg  []  40-60 ml/min 250 mcg  []  20-40 ml/min 125 mcg   2. Follow up QTc after the first 5 doses, renal function, electrolytes (K & Mg) daily x 3     days, dose adjustment, success of initiation and facilitate 1 week discharge supply as      clinically indicated.  3. Initiate Tikosyn education video (Call [phone] and ask for Tikosyn Video # [phone]).  4. Place Enrollment Form on the chart for discharge supply of dofetilide.  -Hildred Laser, Pharm D 02/28/2017 5:41 PM

## 2017-03-01 DIAGNOSIS — I5022 Chronic systolic (congestive) heart failure: Secondary | ICD-10-CM

## 2017-03-01 LAB — BASIC METABOLIC PANEL
ANION GAP: 4 — AB (ref 5–15)
BUN: 24 mg/dL — ABNORMAL HIGH (ref 6–20)
CALCIUM: 9.3 mg/dL (ref 8.9–10.3)
CO2: 27 mmol/L (ref 22–32)
CREATININE: 1.31 mg/dL — AB (ref 0.61–1.24)
Chloride: 110 mmol/L (ref 101–111)
GFR, EST AFRICAN AMERICAN: 57 mL/min — AB (ref >60)
GFR, EST NON AFRICAN AMERICAN: 49 mL/min — AB (ref >60)
Glucose, Bld: 108 mg/dL — ABNORMAL HIGH (ref 65–99)
Potassium: 4.2 mmol/L (ref 3.5–5.1)
SODIUM: 141 mmol/L (ref 135–145)

## 2017-03-01 LAB — MAGNESIUM: Magnesium: 2.3 mg/dL (ref 1.7–2.4)

## 2017-03-01 MED ORDER — SODIUM CHLORIDE 0.9% FLUSH: 3.0000 mL | INTRAVENOUS | Status: DC | PRN

## 2017-03-01 MED ORDER — SODIUM CHLORIDE 0.9 % IV SOLN
250.0000 mL | INTRAVENOUS | Status: DC
  Administered 2017-03-01: 250 mL via INTRAVENOUS

## 2017-03-01 MED ORDER — SODIUM CHLORIDE 0.9% FLUSH
3.0000 mL | Freq: Two times a day (BID) | INTRAVENOUS | Status: DC
  Administered 2017-03-01 – 2017-03-02 (×3): 3 mL via INTRAVENOUS

## 2017-03-01 MED ORDER — SODIUM CHLORIDE 0.9 % IV SOLN: 250.0000 mL | INTRAVENOUS | Status: DC

## 2017-03-01 NOTE — Telephone Encounter (Signed)
**Note De-Identified Angles Trevizo Obfuscation** Received a denial on appeal to cover Eliquis Shya Kovatch fax from [facility]).  I called [facility] and spoke with Lexy who advised me that Eliquis never needed a PA and that their records show that the pt picked up his Eliquis on 9/5 and that it did not require a PA.

## 2017-03-01 NOTE — Care Management Note (Addendum)
Case Management Note  Patient Details  Name: [facility] MRN: [id number] Date of Birth: 1933-07-20  Subjective/Objective:  Pt Presented for Tikosyn Load. Benefits Check completed for Tikosyn and CM will make the patient aware of cost.                   Action/Plan: MD please write for 7 day Rx fr Tikosyn no refills and the original Rx with refills. CM will assist with the 7 day Rx to be filled via [facility]. No further needs from CM at this time  Expected Discharge Date:                  Expected Discharge Plan:  Home/Self Care  In-House Referral:  NA  Discharge planning Services  CM Consult, Medication Assistance  Post Acute Care Choice:  NA Choice offered to:  NA  DME Arranged:  N/A DME Agency:  NA  HH Arranged:  NA HH Agency:  NA  Status of Service:  Completed, signed off  If discussed at [place] of Stay Meetings, dates discussed:    Additional Comments: 1439 03-03-17 Jacqlyn Krauss, RN,BSN [phone] CM did call [name] and the cost will be $179.63. CM did call the wife in regards to price. No further needs from CM at this time.    1350 03-03-17 Jacqlyn Krauss, RN BSN [phone] CM did call [facility] to check the cost for Tikosyn- will try again closed for lunch. Pt has a 7 day Rx to take home from the hospital.    S/W JUDY @ [place] # [phone] OPT- 2   1. TIKOSYN  125 MCG BID  COVER- YES  CO-PAY- $ 216.07  TIER- 3 DRUG  PRIOR APPROVAL-NO   2. TIKOSYN 250 MCG BID  COVER- YES  CO-PAY- $ 216.07  TIER- 3 DRUG  PRIOR APPROVAL- NO   3. TIKOSYN 500 MCG BID  COVER- YES  CO-PAY- $ 216.07  TIER- 3 DRUG  PRIOR APPROVAL- NO ( P/A IN PLACE 02/16/17 THRU 06/20/17 UP TO 500 MCG )     4. DOFETILIDE 125 MCG BID  COVER- YES  CO-PAY- $ 169.37  TIER- 2 DRUG  PRIOR APPROVAL- NO   4. DOFETILIDE 250 MCG BID  COVER- YES  CO-PAY- $ 169.37  TIER- 2 DRUG  PRIOR APPROVAL- NO   5. DOFETILIDE 500  MCG BID  COVER- YES  CO-PAY- $ 169.37  TIER- 2 DRUG  PRIOR APPROVAL- NO    PHARMACY - ANY RETAIL  Bethena Roys, RN 03/01/2017, 1:43 PM

## 2017-03-01 NOTE — Progress Notes (Signed)
QTC 523. Cardiology paged per orders. Cardiology stated it was ok to give Tikosyn.

## 2017-03-01 NOTE — Progress Notes (Signed)
Pt educated x3 about notifying RN or NT when up to bathroom. Pt educated x2 to use hat for urine measurements. Pt incontinent x1.Pt continues to get up without assistance and has not been using hat. Will continue to educate.

## 2017-03-01 NOTE — Progress Notes (Signed)
Progress Note  Patient Name: [facility] Date of Encounter: 03/01/2017  Primary Cardiologist: Lovena Le  Subjective   No chest pain or sob. No complaints. Had some trouble getting to the bathroom this morning.  Inpatient Medications    Scheduled Meds: . apixaban  5 mg Oral BID  . atorvastatin  20 mg Oral Daily  . dofetilide  500 mcg Oral BID  . donepezil  10 mg Oral QHS  . DULoxetine  30 mg Oral Daily  . ezetimibe  10 mg Oral Daily  . folic acid  1 mg Oral Daily  . furosemide  40 mg Oral Daily  . lisinopril  5 mg Oral Daily  . metoprolol succinate  25 mg Oral Daily  . oxybutynin  5 mg Oral BID  . pantoprazole  40 mg Oral Daily  . predniSONE  5 mg Oral Daily  . sodium chloride flush  3 mL Intravenous Q12H  . Vitamin D (Ergocalciferol)  50,000 Units Oral Q7 days   Continuous Infusions: . sodium chloride     PRN Meds: sodium chloride, acetaminophen, sodium chloride flush   Vital Signs    Vitals:   02/28/17 1741 02/28/17 1932 03/01/17 0543  BP: (!) 143/54 (!) 141/70 (!) 120/54  Pulse: 64 62 61  Resp:  18   Temp:  98.8 F (37.1 C) 97.8 F (36.6 C)  TempSrc:  Axillary Oral  SpO2: 100% 100% 97%  Weight: 219 lb 7 oz (99.5 kg)  218 lb 1.6 oz (98.9 kg)  Height: 6\' 3"  (1.905 m)      Intake/Output Summary (Last 24 hours) at 03/01/17 0823 Last data filed at 03/01/17 0700  Gross per 24 hour  Intake              480 ml  Output              325 ml  Net              155 ml   Filed Weights   02/28/17 1741 03/01/17 0543  Weight: 219 lb 7 oz (99.5 kg) 218 lb 1.6 oz (98.9 kg)    Telemetry    Atrial fib with ventricular pacing - Personally Reviewed  ECG    Atrial fib with ventricular pacing. QTC around 510 with QRS of 180. - Personally Reviewed  Physical Exam   GEN: No acute distress.   Neck: No JVD Cardiac: RRR, soft systolic murmur, rubs, or gallops.  Respiratory: Clear to auscultation bilaterally. GI: Soft, nontender, non-distended  MS: trace peripheral  edema; No deformity. Neuro:  Nonfocal  Psych: Normal affect   Labs    Chemistry Recent Labs Lab 02/28/17 0945 03/01/17 0340  NA 141 141  K 4.1 4.2  CL 109 110  CO2 26 27  GLUCOSE 110* 108*  BUN 24* 24*  CREATININE 1.30* 1.31*  CALCIUM 9.5 9.3  GFRNONAA 50* 49*  GFRAA 58* 57*  ANIONGAP 6 4*     HematologyNo results for input(s): WBC, RBC, HGB, HCT, MCV, MCH, MCHC, RDW, PLT in the last 168 hours.  Cardiac EnzymesNo results for input(s): TROPONINI in the last 168 hours. No results for input(s): TROPIPOC in the last 168 hours.   BNPNo results for input(s): BNP, PROBNP in the last 168 hours.   DDimer No results for input(s): DDIMER in the last 168 hours.   Radiology    No results found.  Cardiac Studies   none  Patient Profile     83 y.o. male  admitted for dofetilide.   Assessment & Plan    1. Persistent atrial fib - he will continue dofetilide. Follow QTC. DC CV tomorrow or Thursday if still in atrial fib. 2. Chronic systolic heart failure - he is euvolemic appearing. Might consider LV lead if he cannot maintain NSR especially if EF remains low despite guideline directed medical therapy. 3. HTN - his blood pressure is reasonably well controlled.  For questions or updates, please contact [place] Please consult [email or web address] for contact info under Cardiology/STEMI.      Signed, Cristopher Peru, MD  03/01/2017, 8:23 AM  Patient ID: [name], male   DOB: [birth year], 83 y.o.   MRN: [id number]

## 2017-03-01 NOTE — Consult Note (Signed)
           [facility] CM Primary Care Navigator  03/01/2017  Christopher Baker 14-Oct-1933 [id number]   Went to see patient at the bedside to identify possible discharge needs.  Patientreports having "irregular heart beats" thathad led to this admission (Tikosyn loading).  Patient endorses Dr. Domenick Baker with [facility] as hisprimary care provider. Patient reports using [facility] on [address] to obtain medications without any problem so far. Patient states he was not sure if he will be given this "expensive medication" (Tikosyn) on discharge. He was made aware to seek assistance from providers and referral to [facility] care management from Dr. Rosanna Baker in case he will be having any issues with discharge medications.  According to patient, wife [facility]) has beenmanaging hismedications at home using "pill box" system filled weekly. Patient states that wife has been providing transportation to hisdoctor's appointments. His wife is the primary caregiver at home as stated.  Anticipated discharge plan is home per patient.  Patientexpressed understanding to call primary care provider's office when he getshome, for a post discharge follow-up appointment within a week or sooner if needed. Patient letter (with PCP's contact number) was provided as a reminder.  Explained to patient regarding [facility] CM services available for health management at home. He reports managing his HF with daily weight monitoring/ recording, heart healthy diet and follow-up with providers when needed.  Patienthad opted and verbally agreed forEMMIcalls tofollow-up as he recovers.   Referral was made forEMMI General calls after discharge.  [facility] care management information provided for future needs that may arise.   For questions, please contact:  [facility], BSN, RN- [facility] Primary Care Navigator  Telephone: [phone] [facility]

## 2017-03-02 ENCOUNTER — Encounter (HOSPITAL_COMMUNITY): Payer: Self-pay | Admitting: Certified Registered Nurse Anesthetist

## 2017-03-02 ENCOUNTER — Inpatient Hospital Stay (HOSPITAL_COMMUNITY): Payer: PPO | Admitting: Certified Registered Nurse Anesthetist

## 2017-03-02 ENCOUNTER — Encounter (HOSPITAL_COMMUNITY)
Admission: AD | Disposition: A | Discharge status: Home / Self Care | Payer: Self-pay | Source: Ambulatory Visit | Attending: Internal Medicine

## 2017-03-02 HISTORY — PX: CARDIOVERSION: SHX1299

## 2017-03-02 LAB — BASIC METABOLIC PANEL
ANION GAP: 6 (ref 5–15)
BUN: 21 mg/dL — ABNORMAL HIGH (ref 6–20)
CHLORIDE: 112 mmol/L — AB (ref 101–111)
CO2: 23 mmol/L (ref 22–32)
Calcium: 9.3 mg/dL (ref 8.9–10.3)
Creatinine, Ser: 1.26 mg/dL — ABNORMAL HIGH (ref 0.61–1.24)
GFR calc non Af Amer: 52 mL/min — ABNORMAL LOW (ref >60)
GFR, EST AFRICAN AMERICAN: 60 mL/min — AB (ref >60)
GLUCOSE: 106 mg/dL — AB (ref 65–99)
POTASSIUM: 4 mmol/L (ref 3.5–5.1)
Sodium: 141 mmol/L (ref 135–145)

## 2017-03-02 LAB — MAGNESIUM: Magnesium: 2.1 mg/dL (ref 1.7–2.4)

## 2017-03-02 SURGERY — CARDIOVERSION
Anesthesia: General

## 2017-03-02 MED ORDER — INFLUENZA VAC SPLIT HIGH-DOSE 0.5 ML IM SUSY
0.5000 mL | PREFILLED_SYRINGE | Freq: Once | INTRAMUSCULAR | Status: AC
  Administered 2017-03-03: 0.5 mL via INTRAMUSCULAR
  Filled 2017-03-02: qty 0.5

## 2017-03-02 MED ORDER — SODIUM CHLORIDE 0.9 % IV SOLN
INTRAVENOUS | Status: DC | PRN
  Administered 2017-03-02: 10:00:00 via INTRAVENOUS

## 2017-03-02 MED ORDER — ONDANSETRON HCL 4 MG/2ML IJ SOLN
INTRAMUSCULAR | Status: DC | PRN
  Administered 2017-03-02: 4 mg via INTRAVENOUS

## 2017-03-02 MED ORDER — LIDOCAINE HCL (CARDIAC) 20 MG/ML IV SOLN
INTRAVENOUS | Status: DC | PRN
  Administered 2017-03-02: 60 mg via INTRAVENOUS

## 2017-03-02 MED ORDER — ETOMIDATE 2 MG/ML IV SOLN
INTRAVENOUS | Status: DC | PRN
  Administered 2017-03-02: 12 mg via INTRAVENOUS

## 2017-03-02 NOTE — Transfer of Care (Signed)
Immediate Anesthesia Transfer of Care Note  Patient: [facility]  Procedure(s) Performed: Procedure(s): CARDIOVERSION (N/A)  Patient Location: Endoscopy Unit  Anesthesia Type:General  Level of Consciousness: awake and alert   Airway & Oxygen Therapy: Patient Spontanous Breathing and Patient connected to nasal cannula oxygen  Post-op Assessment: Report given to RN and Post -op Vital signs reviewed and stable  Post vital signs: Reviewed and stable  Last Vitals:  Vitals:   03/02/17 1007 03/02/17 1008  BP:  (!) 137/52  Pulse: 61 61  Resp: (!) 24 17  Temp:    SpO2: 100% 100%    Last Pain:  Vitals:   03/02/17 0928  TempSrc: Oral  PainSc:       Patients Stated Pain Goal: 1 (43/73/57 8978)  Complications: No apparent anesthesia complications

## 2017-03-02 NOTE — Interval H&P Note (Signed)
History and Physical Interval Note:  03/02/2017 9:46 AM  [facility]  has presented today for surgery, with the diagnosis of AFIB  The various methods of treatment have been discussed with the patient and family. After consideration of risks, benefits and other options for treatment, the patient has consented to  Procedure(s): CARDIOVERSION (N/A) as a surgical intervention .  The patient's history has been reviewed, patient examined, no change in status, stable for surgery.  I have reviewed the patient's chart and labs.  Questions were answered to the patient's satisfaction.     Jenkins Rouge

## 2017-03-02 NOTE — CV Procedure (Signed)
[facility] Anesthesia Etomidate / Zofran / Lidocaine  X 1 120 joules biphasic.  Converted from afib to P synch pacing rate 68 [employer] rep present and confirms sinus by atrial electrogram  No immediate neurologic sequelae On Rx Eliquis  Tikosyn load  [employer]

## 2017-03-02 NOTE — Progress Notes (Signed)
Progress Note  Patient Name: [facility] Date of Encounter: 03/02/2017  Primary Cardiologist: Lovena Le  Subjective   No chest pain or sob. No palpitations.  Inpatient Medications    Scheduled Meds: . apixaban  5 mg Oral BID  . atorvastatin  20 mg Oral Daily  . dofetilide  500 mcg Oral BID  . donepezil  10 mg Oral QHS  . DULoxetine  30 mg Oral Daily  . ezetimibe  10 mg Oral Daily  . folic acid  1 mg Oral Daily  . furosemide  40 mg Oral Daily  . lisinopril  5 mg Oral Daily  . metoprolol succinate  25 mg Oral Daily  . oxybutynin  5 mg Oral BID  . pantoprazole  40 mg Oral Daily  . predniSONE  5 mg Oral Daily  . sodium chloride flush  3 mL Intravenous Q12H  . sodium chloride flush  3 mL Intravenous Q12H  . Vitamin D (Ergocalciferol)  50,000 Units Oral Q7 days   Continuous Infusions: . sodium chloride    . sodium chloride 250 mL (03/01/17 2353)   PRN Meds: sodium chloride, acetaminophen, sodium chloride flush, sodium chloride flush   Vital Signs    Vitals:   03/01/17 1351 03/01/17 1926 03/02/17 0500 03/02/17 0825  BP: (!) 116/55 (!) 128/52 120/61 137/70  Pulse: 65 61 62   Resp:   18   Temp: 98.7 F (37.1 C) 97.6 F (36.4 C) 97.6 F (36.4 C)   TempSrc: Oral Oral Oral   SpO2: 98% 97% 97%   Weight:   214 lb 4.8 oz (97.2 kg)   Height:        Intake/Output Summary (Last 24 hours) at 03/02/17 0832 Last data filed at 03/02/17 0657  Gross per 24 hour  Intake            10.07 ml  Output              400 ml  Net          -389.93 ml   Filed Weights   02/28/17 1741 03/01/17 0543 03/02/17 0500  Weight: 219 lb 7 oz (99.5 kg) 218 lb 1.6 oz (98.9 kg) 214 lb 4.8 oz (97.2 kg)    Telemetry    Atrial fib - Personally Reviewed  ECG    Atrial fib with ventricular pacing. QTC reviewed by me. Borderline. Will recheck after DCCV - Personally Reviewed  Physical Exam   GEN: No acute distress.   Neck: 7 cm JVD Cardiac: RRR, no murmurs, rubs, or gallops.    Respiratory: Clear to auscultation bilaterally. GI: Soft, nontender, non-distended  MS: No edema; No deformity. Neuro:  Nonfocal  Psych: Normal affect   Labs    Chemistry Recent Labs Lab 02/28/17 0945 03/01/17 0340 03/02/17 0411  NA 141 141 141  K 4.1 4.2 4.0  CL 109 110 112*  CO2 26 27 23   GLUCOSE 110* 108* 106*  BUN 24* 24* 21*  CREATININE 1.30* 1.31* 1.26*  CALCIUM 9.5 9.3 9.3  GFRNONAA 50* 49* 52*  GFRAA 58* 57* 60*  ANIONGAP 6 4* 6     HematologyNo results for input(s): WBC, RBC, HGB, HCT, MCV, MCH, MCHC, RDW, PLT in the last 168 hours.  Cardiac EnzymesNo results for input(s): TROPONINI in the last 168 hours. No results for input(s): TROPIPOC in the last 168 hours.   BNPNo results for input(s): BNP, PROBNP in the last 168 hours.   DDimer No results for input(s): DDIMER  in the last 168 hours.   Radiology    No results found.  Cardiac Studies   none  Patient Profile     83 y.o. male admitted for initiation of dofetilide. S/p 4th dose this morning. Pending DCCV today. QTC borderline.  Assessment & Plan    1. Atrial fib - he is still in atrial fib. For DCCV today. Will reassess QT based on prolongation of the QRS after his cardioversion. Might need a slight dose reduction.  2. Chronic systolic heart failure - he appears euvolemic. Continue his current meds. 3. PPM - his DDD PM is working normally with appropriate sensing.  Elena Cothern,M.D.  For questions or updates, please contact [place] Please consult [email or web address] for contact info under Cardiology/STEMI.      Signed, Cristopher Peru, MD  03/02/2017, 8:32 AM  Patient ID: [name], male   DOB: [birth year], 83 y.o.   MRN: [id number]

## 2017-03-02 NOTE — Anesthesia Postprocedure Evaluation (Signed)
Anesthesia Post Note  Patient: [facility]  Procedure(s) Performed: Procedure(s) (LRB): CARDIOVERSION (N/A)     Patient location during evaluation: PACU Anesthesia Type: General Level of consciousness: awake and alert Pain management: pain level controlled Vital Signs Assessment: post-procedure vital signs reviewed and stable Respiratory status: spontaneous breathing, nonlabored ventilation, respiratory function stable and patient connected to nasal cannula oxygen Cardiovascular status: blood pressure returned to baseline and stable Postop Assessment: no signs of nausea or vomiting Anesthetic complications: no    Last Vitals:  Vitals:   03/02/17 1050 03/02/17 1255  BP:  (!) 127/57  Pulse: 61   Resp: 15   Temp:    SpO2: 100%     Last Pain:  Vitals:   03/02/17 1010  TempSrc: Oral  PainSc:                  [place]

## 2017-03-02 NOTE — Progress Notes (Signed)
QTC 556 3 hours after Tikosyn was administered. Will notify oncoming nurse.

## 2017-03-02 NOTE — Anesthesia Preprocedure Evaluation (Signed)
Anesthesia Evaluation  Patient identified by MRN, date of birth, ID band Patient awake    Reviewed: Allergy & Precautions, H&P , NPO status , Patient's Chart, lab work & pertinent test results  Airway Mallampati: II   Neck ROM: full    Dental   Pulmonary asthma , sleep apnea , former smoker,    breath sounds clear to auscultation       Cardiovascular hypertension, + Peripheral Vascular Disease  + dysrhythmias Atrial Fibrillation + pacemaker  Rhythm:regular Rate:Normal     Neuro/Psych  Headaches, Seizures -,  PSYCHIATRIC DISORDERS Anxiety Depression TIACVA    GI/Hepatic GERD  ,  Endo/Other    Renal/GU Renal InsufficiencyRenal disease     Musculoskeletal  (+) Arthritis ,   Abdominal   Peds  Hematology   Anesthesia Other Findings   Reproductive/Obstetrics                             Anesthesia Physical Anesthesia Plan  ASA: IV  Anesthesia Plan: General   Post-op Pain Management:    Induction: Intravenous  PONV Risk Score and Plan: 2 and Treatment may vary due to age or medical condition  Airway Management Planned: Mask  Additional Equipment:   Intra-op Plan:   Post-operative Plan:   Informed Consent: I have reviewed the patients History and Physical, chart, labs and discussed the procedure including the risks, benefits and alternatives for the proposed anesthesia with the patient or authorized representative who has indicated his/her understanding and acceptance.     Plan Discussed with: CRNA and Anesthesiologist  Anesthesia Plan Comments:         Anesthesia Quick Evaluation

## 2017-03-02 NOTE — H&P (View-Only) (Signed)
Progress Note  Patient Name: [facility] Date of Encounter: 03/02/2017  Primary Cardiologist: Lovena Le  Subjective   No chest pain or sob. No palpitations.  Inpatient Medications    Scheduled Meds: . apixaban  5 mg Oral BID  . atorvastatin  20 mg Oral Daily  . dofetilide  500 mcg Oral BID  . donepezil  10 mg Oral QHS  . DULoxetine  30 mg Oral Daily  . ezetimibe  10 mg Oral Daily  . folic acid  1 mg Oral Daily  . furosemide  40 mg Oral Daily  . lisinopril  5 mg Oral Daily  . metoprolol succinate  25 mg Oral Daily  . oxybutynin  5 mg Oral BID  . pantoprazole  40 mg Oral Daily  . predniSONE  5 mg Oral Daily  . sodium chloride flush  3 mL Intravenous Q12H  . sodium chloride flush  3 mL Intravenous Q12H  . Vitamin D (Ergocalciferol)  50,000 Units Oral Q7 days   Continuous Infusions: . sodium chloride    . sodium chloride 250 mL (03/01/17 2353)   PRN Meds: sodium chloride, acetaminophen, sodium chloride flush, sodium chloride flush   Vital Signs    Vitals:   03/01/17 1351 03/01/17 1926 03/02/17 0500 03/02/17 0825  BP: (!) 116/55 (!) 128/52 120/61 137/70  Pulse: 65 61 62   Resp:   18   Temp: 98.7 F (37.1 C) 97.6 F (36.4 C) 97.6 F (36.4 C)   TempSrc: Oral Oral Oral   SpO2: 98% 97% 97%   Weight:   214 lb 4.8 oz (97.2 kg)   Height:        Intake/Output Summary (Last 24 hours) at 03/02/17 0832 Last data filed at 03/02/17 0657  Gross per 24 hour  Intake            10.07 ml  Output              400 ml  Net          -389.93 ml   Filed Weights   02/28/17 1741 03/01/17 0543 03/02/17 0500  Weight: 219 lb 7 oz (99.5 kg) 218 lb 1.6 oz (98.9 kg) 214 lb 4.8 oz (97.2 kg)    Telemetry    Atrial fib - Personally Reviewed  ECG    Atrial fib with ventricular pacing. QTC reviewed by me. Borderline. Will recheck after DCCV - Personally Reviewed  Physical Exam   GEN: No acute distress.   Neck: 7 cm JVD Cardiac: RRR, no murmurs, rubs, or gallops.    Respiratory: Clear to auscultation bilaterally. GI: Soft, nontender, non-distended  MS: No edema; No deformity. Neuro:  Nonfocal  Psych: Normal affect   Labs    Chemistry Recent Labs Lab 02/28/17 0945 03/01/17 0340 03/02/17 0411  NA 141 141 141  K 4.1 4.2 4.0  CL 109 110 112*  CO2 26 27 23   GLUCOSE 110* 108* 106*  BUN 24* 24* 21*  CREATININE 1.30* 1.31* 1.26*  CALCIUM 9.5 9.3 9.3  GFRNONAA 50* 49* 52*  GFRAA 58* 57* 60*  ANIONGAP 6 4* 6     HematologyNo results for input(s): WBC, RBC, HGB, HCT, MCV, MCH, MCHC, RDW, PLT in the last 168 hours.  Cardiac EnzymesNo results for input(s): TROPONINI in the last 168 hours. No results for input(s): TROPIPOC in the last 168 hours.   BNPNo results for input(s): BNP, PROBNP in the last 168 hours.   DDimer No results for input(s): DDIMER  in the last 168 hours.   Radiology    No results found.  Cardiac Studies   none  Patient Profile     83 y.o. male admitted for initiation of dofetilide. S/p 4th dose this morning. Pending DCCV today. QTC borderline.  Assessment & Plan    1. Atrial fib - he is still in atrial fib. For DCCV today. Will reassess QT based on prolongation of the QRS after his cardioversion. Might need a slight dose reduction.  2. Chronic systolic heart failure - he appears euvolemic. Continue his current meds. 3. PPM - his DDD PM is working normally with appropriate sensing.  Lakelynn Severtson,M.D.  For questions or updates, please contact [place] Please consult [email or web address] for contact info under Cardiology/STEMI.      Signed, Cristopher Peru, MD  03/02/2017, 8:32 AM  Patient ID: [facility], male   DOB: 05-Oct-1933, 83 y.o.   MRN: [id number]

## 2017-03-03 ENCOUNTER — Encounter (HOSPITAL_COMMUNITY): Payer: Self-pay | Admitting: Cardiovascular Disease

## 2017-03-03 LAB — BASIC METABOLIC PANEL
ANION GAP: 4 — AB (ref 5–15)
BUN: 22 mg/dL — AB (ref 6–20)
CALCIUM: 9.1 mg/dL (ref 8.9–10.3)
CO2: 28 mmol/L (ref 22–32)
CREATININE: 1.27 mg/dL — AB (ref 0.61–1.24)
Chloride: 108 mmol/L (ref 101–111)
GFR, EST AFRICAN AMERICAN: 59 mL/min — AB (ref >60)
GFR, EST NON AFRICAN AMERICAN: 51 mL/min — AB (ref >60)
Glucose, Bld: 113 mg/dL — ABNORMAL HIGH (ref 65–99)
Potassium: 4.2 mmol/L (ref 3.5–5.1)
Sodium: 140 mmol/L (ref 135–145)

## 2017-03-03 LAB — MAGNESIUM: Magnesium: 2.3 mg/dL (ref 1.7–2.4)

## 2017-03-03 MED ORDER — DOFETILIDE 500 MCG PO CAPS: 500.0000 ug | ORAL_CAPSULE | Freq: Two times a day (BID) | ORAL | 6 refills | Status: DC

## 2017-03-03 MED ORDER — DULOXETINE HCL 30 MG PO CPEP: 30.0000 mg | ORAL_CAPSULE | Freq: Every day | ORAL | 0 refills | Status: DC

## 2017-03-03 MED ORDER — DOFETILIDE 500 MCG PO CAPS
500.0000 ug | ORAL_CAPSULE | Freq: Two times a day (BID) | ORAL | Status: DC
  Administered 2017-03-03: 500 ug via ORAL
  Filled 2017-03-03: qty 1

## 2017-03-03 NOTE — Discharge Summary (Signed)
ELECTROPHYSIOLOGY PROCEDURE DISCHARGE SUMMARY    Patient ID: Christopher Baker,  MRN: [id number], DOB/AGE: 08-14-1933 83 y.o.  Admit date: 02/28/2017 Discharge date: 03/03/2017  Primary Care Physician: Haywood Pao, MD  Primary Cardiologist: Dr. Lovena Le   Primary Discharge Diagnosis:  1.  persistent atrial fibrillation status post Tikosyn loading this admission      CHA2DS2Vasc is at least 6, on Eliquis  Secondary Discharge Diagnosis:  1. Symptomatic bradycardia w/PPM 2. HTN 3. VHD     Mod AS 4. Presumed non-ischemic CM  Allergies  Allergen Reactions  . Ciprofloxacin Nausea Only    Stomach ache  . Hydrocodone-Acetaminophen Other (See Comments)    Hallucinations in higher doses  . Other Other (See Comments)    Rash from neoprene wrap after knee surgery  . Shellfish Allergy Nausea And Vomiting    Reaction to scallops     Procedures This Admission:  1.  Tikosyn loading 2.  Direct current cardioversion on 03/02/17 by Dr Johnsie Cancel which successfully restored SR.  There were no early apparent complications.   Brief HPI: Christopher Baker is a 83 y.o. male with a past medical history as noted above.  He is followed in EP in the outpatient treatment options of atrial fibrillation discussed.  Risks, benefits, and alternatives to Tikosyn were reviewed with the patient who wished to proceed.    Hospital Course:  The patient was admitted and Tikosyn was initiated.  Renal function and electrolytes were followed during the hospitalization.  His QTc remained stable.  On 03/02/17 the patient underwent direct current cardioversion which restored sinus rhythm.  He was monitored until discharge on telemetry which demonstrated AF/Vpacing >> SR/Vpacing.  On the day of discharge he was examined by Dr Lovena Le who considered him stable for discharge to home.  Follow-up has been arranged with the AFib clinic in 1 week and with Dr Lovena Le in 4 weeks.   Reviewed medicines with the patient's wife his  medicines.  Physical Exam: Vitals:   03/02/17 2204 03/03/17 0624 03/03/17 0854 03/03/17 0855  BP: (!) 121/59 134/71 137/69 137/69  Pulse: (!) 59 64 69   Resp:      Temp: 98 F (36.7 C) 97.6 F (36.4 C)    TempSrc:  Oral    SpO2: 99% 97%    Weight:  214 lb 14.4 oz (97.5 kg)    Height:        GEN- The patient is well appearing, alert and oriented x 3 today.   HEENT: normocephalic, atraumatic; sclera clear, conjunctiva pink; hearing intact; oropharynx clear; neck supple, no JVP Lymph- no cervical lymphadenopathy Lungs- CTA b/l, normal work of breathing.  No wheezes, rales, rhonchi Heart- RRR,soft SM, no rubs or gallops, PMI not laterally displaced GI- soft, non-tender, non-distended Extremities- no clubbing, cyanosis, 1+ edema much improved MS- no significant deformity, age appropriate atrophy Skin- warm and dry, no rash or lesion Psych- euthymic mood, full affect Neuro- strength and sensation are intact   Labs:   Lab Results  Component Value Date   WBC 9.7 06/24/2015   HGB 9.7 (L) 06/28/2015   HCT 31.3 (L) 06/28/2015   MCV 90.5 06/24/2015   PLT 210 06/24/2015     Recent Labs Lab 03/03/17 0210  NA 140  K 4.2  CL 108  CO2 28  BUN 22*  CREATININE 1.27*  CALCIUM 9.1  GLUCOSE 113*     Discharge Medications:  Allergies as of 03/03/2017      Reactions  Ciprofloxacin Nausea Only   Stomach ache   Hydrocodone-acetaminophen Other (See Comments)   Hallucinations in higher doses   Other Other (See Comments)   Rash from neoprene wrap after knee surgery   Shellfish Allergy Nausea And Vomiting   Reaction to scallops      Medication List    STOP taking these medications   sertraline 25 MG tablet Commonly known as:  ZOLOFT     TAKE these medications   acetaminophen 500 MG tablet Commonly known as:  TYLENOL Take 1,000 mg by mouth 2 (two) times daily.   amoxicillin 500 MG capsule Commonly known as:  AMOXIL Take 2,000 mg by mouth See admin instructions. Take  4 tablets (1000 mg) by mouth one hour prior to dental apointments   atorvastatin 20 MG tablet Commonly known as:  LIPITOR Take 20 mg by mouth daily.   cyanocobalamin 1000 MCG/ML injection Commonly known as:  (VITAMIN B-12) Inject 1,000 mcg into the muscle every 30 (thirty) days. Last injection 02/27/17   dofetilide 500 MCG capsule Commonly known as:  TIKOSYN Take 1 capsule (500 mcg total) by mouth 2 (two) times daily.   donepezil 10 MG tablet Commonly known as:  ARICEPT TAKE ONE TABLET BY MOUTH EVERY NIGHT AT BEDTIME What changed:  See the new instructions.   DULoxetine 30 MG capsule Commonly known as:  CYMBALTA Take 1 capsule (30 mg total) by mouth daily. What changed:  medication strength  how much to take Notes to patient:  You must see your primary doctor or neurologist for further refills, please call their office.   ELIQUIS 5 MG Tabs tablet Generic drug:  apixaban TAKE 1 TABLET (5 MG TOTAL) BY MOUTH 2 (TWO) TIMES DAILY.   ezetimibe 10 MG tablet Commonly known as:  ZETIA Take 1 tablet (10 mg total) by mouth daily.   ferrous sulfate 325 (65 FE) MG tablet Take 325 mg by mouth at bedtime.   folic acid 1 MG tablet Commonly known as:  FOLVITE Take 1 mg by mouth at bedtime.   furosemide 40 MG tablet Commonly known as:  LASIX Take 40 mg by mouth See admin instructions. Take 1 tablet (40 mg) by mouth every morning, may take an additional tablet if needed for swelling/edema   lisinopril 5 MG tablet Commonly known as:  PRINIVIL,ZESTRIL TAKE ONE TABLET BY MOUTH DAILY What changed:  See the new instructions.   loperamide 2 MG tablet Commonly known as:  IMODIUM A-D Take 2 mg by mouth daily.   methotrexate 2.5 MG tablet Commonly known as:  RHEUMATREX Take 4 tablets (10 mg total) by mouth once a week. Caution:Chemotherapy. Protect from light. Every monday What changed:  how much to take  when to take this  additional instructions   metoprolol succinate 25 MG 24  hr tablet Commonly known as:  TOPROL XL Take 1 tablet (25 mg total) by mouth daily. What changed:  when to take this   OVER THE COUNTER MEDICATION Place 1 drop into both eyes 2 (two) times daily as needed (dry eyes). Over the counter lubricant eye drops   oxybutynin 5 MG tablet Commonly known as:  DITROPAN Take 5 mg by mouth 2 (two) times daily.   pantoprazole 40 MG tablet Commonly known as:  PROTONIX Take 1 tablet (40 mg total) by mouth daily. What changed:  when to take this   predniSONE 5 MG tablet Commonly known as:  DELTASONE Take 5 mg by mouth daily.   PRESCRIPTION MEDICATION Inhale into  the lungs at bedtime. CPAP   STEP N REST WALKER Misc Use as directed   Vitamin D (Ergocalciferol) 50000 units Caps capsule Commonly known as:  DRISDOL Take 50,000 Units by mouth every Sunday.            Discharge Care Instructions        Start     Ordered   03/04/17 0000  DULoxetine (CYMBALTA) 30 MG capsule  Daily    Question:  Supervising Provider  Answer:  ALLRED, JAMES   03/03/17 1235   03/03/17 0000  dofetilide (TIKOSYN) 500 MCG capsule  2 times daily    Question:  Supervising Provider  Answer:  ALLRED, JAMES   03/03/17 1235   03/03/17 0000  Increase activity slowly     03/03/17 1235   03/03/17 0000  Diet - low sodium heart healthy     09 /13/18 1235      Disposition: Home Discharge Instructions    Diet - low sodium heart healthy    Complete by:  As directed    Increase activity slowly    Complete by:  As directed      Follow-up Information    [facility] Follow up on 03/10/2017.   Specialty:  Cardiology Why:  10:00AM Contact information: [address] [id number] mc [address] [place]       Evans Lance, MD Follow up on 04/13/2017.   Specialty:  Cardiology Why:  2:45PM Contact information: [address] [address] [phone]           Duration of  Discharge Encounter: Greater than 30 minutes including physician time.  Venetia Night, PA-C 03/03/2017 12:40 PM  EP Attending Patient seen and examined. He appears to be maintaining NSR. His QT is prolonged but corrects back to an acceptable level when the QRS duration is accounted for. He is stable for DC home with usual followup.  Mikle Bosworth.D.

## 2017-03-03 NOTE — Discharge Instructions (Signed)

## 2017-03-03 NOTE — Plan of Care (Signed)
Problem: Activity: Goal: Ability to tolerate increased activity will improve Outcome: Adequate for Discharge Patient uses a rolling front walker to ambulate.

## 2017-03-03 NOTE — Plan of Care (Signed)
Problem: Health Behavior/Discharge Planning: Goal: Ability to safely manage health-related needs after discharge will improve Outcome: Completed/Met Date Met: 03/03/17 Patient and will will be given all discharge instructions both in written and verbal format.

## 2017-03-04 ENCOUNTER — Telehealth: Payer: Self-pay | Admitting: Internal Medicine

## 2017-03-04 NOTE — Telephone Encounter (Signed)
New Message  Pt wife call requesting to speak with RN. Pt wife states she has lost pts Tikosyn. Pt states pt had a procedure on yesterday. She states pt has missed one dose and the next to be taken at 9am. Pt wife would like to know if its possible to get more of the medication. Please call back to discuss

## 2017-03-04 NOTE — Telephone Encounter (Signed)
Call received from wife.  Wife had misplaced Pt tikosyn.  Pharmacy will not have any in stock until Monday.  Pt already missed one dose.  Received sample of Tikosyn from pharmacist.  Notified wife would be at front desk for her to pick up.  Advised her to have husband take pill asap, and then take 2nd dose tonight as scheduled.  Wife indicates understanding.  Will put at front desk for her to pick up.  F/u appt already scheduled for 03/10/2017 in afib clinic.

## 2017-03-07 DIAGNOSIS — N390 Urinary tract infection, site not specified: Secondary | ICD-10-CM | POA: Diagnosis not present

## 2017-03-07 DIAGNOSIS — I6990 Unspecified sequelae of unspecified cerebrovascular disease: Secondary | ICD-10-CM | POA: Diagnosis not present

## 2017-03-07 DIAGNOSIS — E7800 Pure hypercholesterolemia, unspecified: Secondary | ICD-10-CM | POA: Diagnosis not present

## 2017-03-07 DIAGNOSIS — G4733 Obstructive sleep apnea (adult) (pediatric): Secondary | ICD-10-CM | POA: Diagnosis not present

## 2017-03-07 DIAGNOSIS — E440 Moderate protein-calorie malnutrition: Secondary | ICD-10-CM | POA: Diagnosis not present

## 2017-03-07 DIAGNOSIS — I10 Essential (primary) hypertension: Secondary | ICD-10-CM | POA: Diagnosis not present

## 2017-03-07 DIAGNOSIS — I480 Paroxysmal atrial fibrillation: Secondary | ICD-10-CM | POA: Diagnosis not present

## 2017-03-07 DIAGNOSIS — I69959 Hemiplegia and hemiparesis following unspecified cerebrovascular disease affecting unspecified side: Secondary | ICD-10-CM | POA: Diagnosis not present

## 2017-03-07 DIAGNOSIS — I509 Heart failure, unspecified: Secondary | ICD-10-CM | POA: Diagnosis not present

## 2017-03-07 DIAGNOSIS — D692 Other nonthrombocytopenic purpura: Secondary | ICD-10-CM | POA: Diagnosis not present

## 2017-03-07 DIAGNOSIS — Z7901 Long term (current) use of anticoagulants: Secondary | ICD-10-CM | POA: Diagnosis not present

## 2017-03-07 DIAGNOSIS — R8299 Other abnormal findings in urine: Secondary | ICD-10-CM | POA: Diagnosis not present

## 2017-03-07 DIAGNOSIS — Z6827 Body mass index (BMI) 27.0-27.9, adult: Secondary | ICD-10-CM | POA: Diagnosis not present

## 2017-03-09 ENCOUNTER — Telehealth: Payer: Self-pay | Admitting: Neurology

## 2017-03-09 NOTE — Telephone Encounter (Signed)
Pt called and wants Dr Tomi Likens to recommend a psychologist for them

## 2017-03-10 ENCOUNTER — Inpatient Hospital Stay (HOSPITAL_COMMUNITY): Admit: 2017-03-10 | Payer: PPO | Admitting: Nurse Practitioner

## 2017-03-11 ENCOUNTER — Ambulatory Visit (HOSPITAL_COMMUNITY)
Admission: RE | Admit: 2017-03-11 | Discharge: 2017-03-11 | Disposition: A | Discharge status: Home / Self Care | Payer: PPO | Source: Ambulatory Visit | Attending: Nurse Practitioner | Admitting: Nurse Practitioner

## 2017-03-11 VITALS — BP 124/62 | HR 71 | Ht 75.0 in | Wt 214.0 lb

## 2017-03-11 DIAGNOSIS — R7303 Prediabetes: Secondary | ICD-10-CM | POA: Insufficient documentation

## 2017-03-11 DIAGNOSIS — Z8673 Personal history of transient ischemic attack (TIA), and cerebral infarction without residual deficits: Secondary | ICD-10-CM | POA: Insufficient documentation

## 2017-03-11 DIAGNOSIS — Z79899 Other long term (current) drug therapy: Secondary | ICD-10-CM | POA: Diagnosis not present

## 2017-03-11 DIAGNOSIS — F329 Major depressive disorder, single episode, unspecified: Secondary | ICD-10-CM | POA: Insufficient documentation

## 2017-03-11 DIAGNOSIS — Z923 Personal history of irradiation: Secondary | ICD-10-CM | POA: Diagnosis not present

## 2017-03-11 DIAGNOSIS — G2581 Restless legs syndrome: Secondary | ICD-10-CM | POA: Diagnosis not present

## 2017-03-11 DIAGNOSIS — Z8546 Personal history of malignant neoplasm of prostate: Secondary | ICD-10-CM | POA: Diagnosis not present

## 2017-03-11 DIAGNOSIS — G4733 Obstructive sleep apnea (adult) (pediatric): Secondary | ICD-10-CM | POA: Diagnosis not present

## 2017-03-11 DIAGNOSIS — K219 Gastro-esophageal reflux disease without esophagitis: Secondary | ICD-10-CM | POA: Insufficient documentation

## 2017-03-11 DIAGNOSIS — I481 Persistent atrial fibrillation: Secondary | ICD-10-CM | POA: Diagnosis not present

## 2017-03-11 DIAGNOSIS — I4819 Other persistent atrial fibrillation: Secondary | ICD-10-CM

## 2017-03-11 DIAGNOSIS — Z950 Presence of cardiac pacemaker: Secondary | ICD-10-CM | POA: Diagnosis not present

## 2017-03-11 DIAGNOSIS — I10 Essential (primary) hypertension: Secondary | ICD-10-CM | POA: Insufficient documentation

## 2017-03-11 DIAGNOSIS — Z87891 Personal history of nicotine dependence: Secondary | ICD-10-CM | POA: Insufficient documentation

## 2017-03-11 DIAGNOSIS — E785 Hyperlipidemia, unspecified: Secondary | ICD-10-CM | POA: Diagnosis not present

## 2017-03-11 DIAGNOSIS — Z7901 Long term (current) use of anticoagulants: Secondary | ICD-10-CM | POA: Insufficient documentation

## 2017-03-11 DIAGNOSIS — F419 Anxiety disorder, unspecified: Secondary | ICD-10-CM | POA: Diagnosis not present

## 2017-03-11 DIAGNOSIS — M1990 Unspecified osteoarthritis, unspecified site: Secondary | ICD-10-CM | POA: Insufficient documentation

## 2017-03-11 LAB — BASIC METABOLIC PANEL
Anion gap: 6 (ref 5–15)
BUN: 32 mg/dL — ABNORMAL HIGH (ref 6–20)
CHLORIDE: 110 mmol/L (ref 101–111)
CO2: 24 mmol/L (ref 22–32)
Calcium: 9.8 mg/dL (ref 8.9–10.3)
Creatinine, Ser: 1.16 mg/dL (ref 0.61–1.24)
GFR calc Af Amer: >60 mL/min (ref >60)
GFR calc non Af Amer: 57 mL/min — ABNORMAL LOW (ref >60)
Glucose, Bld: 96 mg/dL (ref 65–99)
POTASSIUM: 4 mmol/L (ref 3.5–5.1)
SODIUM: 140 mmol/L (ref 135–145)

## 2017-03-11 LAB — MAGNESIUM: Magnesium: 2.2 mg/dL (ref 1.7–2.4)

## 2017-03-11 NOTE — Progress Notes (Signed)
Primary Care Physician: Tisovec, Fransico Him, MD Referring Physician: Dr. Avie Echevaria is a 83 y.o. male with a h/o symptomatic bradycardia w/PPM, unexplained syncope, HTN, CVA, persistent afib with EF of 20-25%, chronic LLE , comes in today to be seen for Dr. Lovena Le to be admitted for [place]. This was originally planned for 3 weeks ago but had missed one dose of eliquis. He is here today stating no missed doses. Meds were screened by PharmD and it was noted that he is on Aricept and Zoloft and she suggested to try to get pt off Zoloft as there were no alternatives for Aricept. He continues on Zoloft but I did talk to to Dr. Tomi Likens, who started drug 4 years ago after he had depression 2/2 stroke. Dr. Tomi Likens was OK to stop the drug as of this am and start duloxetine (cymbalta ) 30 mg to start in am.   F/u in afib clinic, 9/21, interrogation of his device shows return to [place]. Unfortunately, he does not notice any difference in how he feels but hopefully staying in SR will help his LV dysfunction.  Today, he denies symptoms of palpitations, chest pain, shortness of breath, orthopnea, PND,  dizziness, presyncope, syncope, or neurologic sequela. The patient is tolerating medications without difficulties and is otherwise without complaint today.   Past Medical History:  Diagnosis Date  . Anxiety   . Arthritis    "hands, right hip; lower back" (02/28/2017)  . Arthritis with psoriasis ([place])   . Borderline diabetes    DIET CONTROLLED - PT STATES HE IS NOT DIABETIC  . Carotid stenosis, bilateral PER DR EARLY / DUPLEX  09-09-10     40 - 59%   BILATERALLY--- ASYMPTOMATIC  . Chronic lower back pain   . Depression   . Dysrhythmia    a-fib  . GERD (gastroesophageal reflux disease)    CONTROLLED W/ PROTONIX  . History of gout   . HTN (hypertension)   . Hyperlipidemia   . Iron deficiency   . Lumbar spondylosis W/ RADICULOPATHY  . OSA on CPAP   . PAF (paroxysmal atrial fibrillation) ([place])     . Presence of permanent cardiac pacemaker DDD-  06-04-10-   DDD; SECONDARY TO SYNCOPY AND BRADYCARDIA  . Prostate cancer ([place]) 2008   S/P 39 RADIATION  [place]    . Restless leg syndrome   . Stroke [facility]) Oct. 14, 2014   light left hand, balance issues, short term memory loss 2014  . SVT (supraventricular tachycardia) ([place])    S/P ABLATION   2008   Past Surgical History:  Procedure Laterality Date  . BACK SURGERY    . BREAST SURGERY     "removed tumor; don't remember which side; years ago" (02/28/2017)  . CARDIAC ELECTROPHYSIOLOGY STUDY AND ABLATION  2008   FOR SVT  . CARDIAC PACEMAKER PLACEMENT  06-04-10   DDD  . CARDIOVERSION N/A 03/02/2017   Procedure: CARDIOVERSION;  Surgeon: Josue Hector, MD;  Location: [facility] ENDOSCOPY;  Service: Cardiovascular;  Laterality: N/A;  . CATARACT EXTRACTION W/ INTRAOCULAR LENS  IMPLANT, BILATERAL Bilateral   . CHOLECYSTECTOMY  2009  . ESOPHAGOGASTRODUODENOSCOPY N/A 12/12/2014   Procedure: ESOPHAGOGASTRODUODENOSCOPY (EGD);  Surgeon: Wilford Corner, MD;  Location: [facility] ENDOSCOPY;  Service: Endoscopy;  Laterality: N/A;  . INCISION AND DRAINAGE Right 1997   "knee replacement got infected"  . INGUINAL HERNIA REPAIR Left 06/2003    DONE WITH PENILE PROSTESIS SURG.  . INGUINAL HERNIA REPAIR Right 1962  .  JOINT REPLACEMENT    . KNEE ARTHROSCOPY  06/02/2011   Procedure: ARTHROSCOPY KNEE;  Surgeon: Gearlean Alf;  Location: [facility];  Service: Orthopedics;  Laterality: Left;  LEFT KNEE ARTHROSCOPY WITH DEBRIDEMENT  . LOOP RECORDER PLACEMENT  01-30-10  . LOOP RECORDER REMOVAL  06/04/2010  . LUMBAR LAMINECTOMY/ DISKECTOMY/ FUSION  05-19-10   L4 - 5  . PENILE PROSTHESIS IMPLANT  06/2003   AND LEFT CORPOROPLASTY /    DONE INGUINAL REPAIR  . PROSTATE BIOPSY  2007  . REVISION TOTAL KNEE ARTHROPLASTY Right 1997  . TOTAL KNEE ARTHROPLASTY  12/20/2011   Procedure: TOTAL KNEE ARTHROPLASTY;  Surgeon: Gearlean Alf, MD;  Location: [facility] ORS;  Service:  Orthopedics;  Laterality: Left;  . TOTAL KNEE ARTHROPLASTY Right 1997  . TOTAL SHOULDER ARTHROPLASTY Right 06/27/2015   Procedure: REVERSE TOTAL SHOULDER ARTHROPLASTY;  Surgeon: Netta Cedars, MD;  Location: [facility];  Service: Orthopedics;  Laterality: Right;  . TRANSURETHRAL RESECTION OF BLADDER TUMOR WITH GYRUS (TURBT-GYRUS)  ?2018  . TRANSURETHRAL RESECTION OF PROSTATE  2006    Current Outpatient Prescriptions  Medication Sig Dispense Refill  . acetaminophen (TYLENOL) 500 MG tablet Take 1,000 mg by mouth 2 (two) times daily.    Marland Kitchen amoxicillin (AMOXIL) 500 MG capsule Take 2,000 mg by mouth See admin instructions. Take 4 tablets (1000 mg) by mouth one hour prior to dental apointments    . atorvastatin (LIPITOR) 20 MG tablet Take 20 mg by mouth daily.    . cyanocobalamin (,VITAMIN B-12,) 1000 MCG/ML injection Inject 1,000 mcg into the muscle every 30 (thirty) days. Last injection 02/27/17    . dofetilide (TIKOSYN) 500 MCG capsule Take 1 capsule (500 mcg total) by mouth 2 (two) times daily. 60 capsule 6  . donepezil (ARICEPT) 10 MG tablet TAKE ONE TABLET BY MOUTH EVERY NIGHT AT BEDTIME (Patient taking differently: TAKE ONE TABLET (10 MG) BY MOUTH EVERY NIGHT AT BEDTIME) 90 tablet 2  . DULoxetine (CYMBALTA) 30 MG capsule Take 1 capsule (30 mg total) by mouth daily. 30 capsule 0  . ELIQUIS 5 MG TABS tablet TAKE 1 TABLET (5 MG TOTAL) BY MOUTH 2 (TWO) TIMES DAILY. 60 tablet 5  . ezetimibe (ZETIA) 10 MG tablet Take 1 tablet (10 mg total) by mouth daily. 90 tablet 1  . ferrous sulfate 325 (65 FE) MG tablet Take 325 mg by mouth at bedtime.    . folic acid (FOLVITE) 1 MG tablet Take 1 mg by mouth at bedtime.     . furosemide (LASIX) 40 MG tablet Take 40 mg by mouth See admin instructions. Take 1 tablet (40 mg) by mouth every morning, may take an additional tablet if needed for swelling/edema    . lisinopril (PRINIVIL,ZESTRIL) 5 MG tablet TAKE ONE TABLET BY MOUTH DAILY (Patient taking differently: TAKE ONE  TABLET (5 MG) BY MOUTH DAILY AT BEDTIME) 90 tablet 3  . loperamide (IMODIUM A-D) 2 MG tablet Take 2 mg by mouth daily.    . methotrexate (RHEUMATREX) 2.5 MG tablet Take 4 tablets (10 mg total) by mouth once a week. Caution:Chemotherapy. Protect from light. Every monday (Patient taking differently: Take 15 mg by mouth every Monday. Caution:Chemotherapy. Protect from light. Every monday) 4 tablet 1  . metoprolol succinate (TOPROL XL) 25 MG 24 hr tablet Take 1 tablet (25 mg total) by mouth daily. (Patient taking differently: Take 25 mg by mouth at bedtime. ) 90 tablet 1  . Misc. Devices (STEP N REST WALKER) MISC Use  as directed 1 each 0  . OVER THE COUNTER MEDICATION Place 1 drop into both eyes 2 (two) times daily as needed (dry eyes). Over the counter lubricant eye drops    . oxybutynin (DITROPAN) 5 MG tablet Take 5 mg by mouth 2 (two) times daily.     . pantoprazole (PROTONIX) 40 MG tablet Take 1 tablet (40 mg total) by mouth daily. (Patient taking differently: Take 40 mg by mouth at bedtime. ) 90 tablet 3  . predniSONE (DELTASONE) 5 MG tablet Take 5 mg by mouth daily.    Marland Kitchen PRESCRIPTION MEDICATION Inhale into the lungs at bedtime. CPAP    . Vitamin D, Ergocalciferol, (DRISDOL) 50000 units CAPS capsule Take 50,000 Units by mouth every Sunday.      No current facility-administered medications for this encounter.     Allergies  Allergen Reactions  . Ciprofloxacin Nausea Only    Stomach ache  . Hydrocodone-Acetaminophen Other (See Comments)    Hallucinations in higher doses  . Other Other (See Comments)    Rash from neoprene wrap after knee surgery  . Shellfish Allergy Nausea And Vomiting    Reaction to scallops    Social History   Social History  . Marital status: Married    Spouse name: N/A  . Number of children: N/A  . Years of education: N/A   Occupational History  . retired    Social History Main Topics  . Smoking status: Former Smoker    Packs/day: 1.00    Years: 45.00     Types: Cigarettes    Quit date: 12/07/1998  . Smokeless tobacco: Never Used  . Alcohol use 2.4 oz/week    4 Shots of liquor per week     Comment: 02/28/2017 "gin and tonics"  . Drug use: No  . Sexual activity: Not Currently    Partners: Female   Other Topics Concern  . Not on file   Social History Narrative  . No narrative on file    Family History  Problem Relation Age of Onset  . Stroke Mother   . Early death Father     ROS- All systems are reviewed and negative except as per the HPI above  Physical Exam: Vitals:   03/11/17 1017  BP: 124/62  Pulse: 71  Weight: 214 lb (97.1 kg)  Height: 6\' 3"  (1.905 m)   Wt Readings from Last 3 Encounters:  03/11/17 214 lb (97.1 kg)  03/03/17 214 lb 14.4 oz (97.5 kg)  02/28/17 219 lb 6.4 oz (99.5 kg)    Labs: Lab Results  Component Value Date   NA 140 03/11/2017   K 4.0 03/11/2017   CL 110 03/11/2017   CO2 24 03/11/2017   GLUCOSE 96 03/11/2017   BUN 32 (H) 03/11/2017   CREATININE 1.16 03/11/2017   CALCIUM 9.8 03/11/2017   MG 2.2 03/11/2017   Lab Results  Component Value Date   INR 1.01 06/24/2015   Lab Results  Component Value Date   CHOL 146 06/19/2014   HDL 55 06/19/2014   LDLCALC 78 06/19/2014   TRIG 64 06/19/2014     GEN- The patient is well appearing, alert and oriented x 3 today.   Head- normocephalic, atraumatic Eyes-  Sclera clear, conjunctiva pink Ears- hearing intact Oropharynx- clear Neck- supple, no JVP Lymph- no cervical lymphadenopathy Lungs- Clear to ausculation bilaterally, normal work of breathing Heart- Regular rate and rhythm,(paced) no murmurs, rubs or gallops, PMI not laterally displaced GI- soft, NT, ND, + BS  Extremities- no clubbing, cyanosis, or edema MS- no significant deformity or atrophy Skin- no rash or lesion Psych- euthymic mood, full affect Neuro- strength and sensation are intact  EKG-AS/VP at 71 bpm, pr int 192 ms, qrs int 188 ms, qtc 524 ms( due to paced rhythm    Interrogated briefly for afib burden  and showed normal functioning PM with no afib since tikosyn started    Assessment and Plan: 1. Persistent afib S/p tikosyn admission Maintaining SR General precautions discussed with taking tikosyn  Continue dofetilide 500 mcg bid Continue BB without change Continue eliquis 5 mg bid for chadsvasc score of at least 6 bmet/mag  F/u with Dr. Lovena Le 10/24  Butch Penny C. Nakeisha Greenhouse, [facility] [address] [place], [place] [address] [phone]

## 2017-03-11 NOTE — Telephone Encounter (Signed)
If he means a psychologist, then any of the providers at [facility] are good. If he means a psychiatrist, there is Dr. Norma Fredrickson.

## 2017-03-11 NOTE — Telephone Encounter (Signed)
Dr Estelle Grumbles there anyone in particular you recommend?

## 2017-03-14 NOTE — Telephone Encounter (Signed)
LM on VM with recommendations.

## 2017-03-15 DIAGNOSIS — Z6827 Body mass index (BMI) 27.0-27.9, adult: Secondary | ICD-10-CM | POA: Diagnosis not present

## 2017-03-15 DIAGNOSIS — E663 Overweight: Secondary | ICD-10-CM | POA: Diagnosis not present

## 2017-03-15 DIAGNOSIS — M2550 Pain in unspecified joint: Secondary | ICD-10-CM | POA: Diagnosis not present

## 2017-03-15 DIAGNOSIS — M545 Low back pain: Secondary | ICD-10-CM | POA: Diagnosis not present

## 2017-03-15 DIAGNOSIS — M5136 Other intervertebral disc degeneration, lumbar region: Secondary | ICD-10-CM | POA: Diagnosis not present

## 2017-03-15 DIAGNOSIS — L4059 Other psoriatic arthropathy: Secondary | ICD-10-CM | POA: Diagnosis not present

## 2017-03-21 ENCOUNTER — Ambulatory Visit (INDEPENDENT_AMBULATORY_CARE_PROVIDER_SITE_OTHER): Payer: PPO | Admitting: Psychology

## 2017-03-21 ENCOUNTER — Encounter: Payer: Self-pay | Admitting: Psychology

## 2017-03-21 DIAGNOSIS — F4321 Adjustment disorder with depressed mood: Secondary | ICD-10-CM

## 2017-03-21 DIAGNOSIS — F0390 Unspecified dementia without behavioral disturbance: Secondary | ICD-10-CM

## 2017-03-21 NOTE — Progress Notes (Signed)
NEUROPSYCHOLOGICAL INTERVIEW (CPT: [id number])  Name: Christopher Baker Date of Birth: Dec 23, 1933 Date of Interview: 03/21/2017  Reason for Referral:  Christopher Baker is a 83 y.o. right handed male who is referred for neuropsychological re-evaluation by Dr. Metta Clines of [facility] due to concerns about cognitive decline/dementia. This patient is unaccompanied in the office for today's visit.  I previously completed neuropsychological testing with Christopher Baker in 02/2016. My clinical impressions at that time were as follows: Mild dementia, unspecified, likely multifactorial (cerebrovascular disease, vitamin B12 deficiency, sleep apnea, rule-out left hemisphere stroke/multiple infarcts); Adjustment disorder with mixed depression and anxiety. The patient's current cognitive profile, interestingly, is indicative of more left hemisphere involvement than right, which is surprising given his history of right-hemisphere stroke in 03/2013 and the reported findings from previous neuropsychological evaluation in 06/2014 (these records unfortunately not available for my review). 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 It seems that his visual-spatial functioning has clearly improved, which is surprising given that testing in 06/2014 still apparently showed trouble in this domain (as most improvement is typically seen in the one year post stroke). Meanwhile, a decline in verbal memory and language since his last evaluation is apparent, raising suspicion for more recent left hemisphere involvement. I suspect vascular damage in the left hemisphere over Alzheimer's disease, given that his non-verbal memory is so intact and his overall cognitive profile is so lateralizing. There is also likely some degree of frontal-subcortical dysfunction related to cerebrovascular disease in general.  Low vitamin B12 and inadequately treated sleep apnea are also likely contributing to cognitive dysfunction.  Overall, I would call this dementia based on the level of impairment in verbal memory and semantic retrieval, but he clearly has high cognitive reserve and is able to function relatively well in spite of these deficits. I am hopeful that cognition may be enhanced with vitamin B12 supplementation and more consistent CPAP treatment. The patient is also presenting with adjustment reaction to physical limitations and declining function.  History of Presenting Problem [per my 03/11/2016 visit with the patient and his wife]:  Christopher Baker sustained a cardio-embolic stroke in 83/3825 with left hand and arm weakness and facial droop. CT of the head revealed right basal ganglia and parietal lobe infarcts. In 05/2014, he had an episode of confusion and left facial droop and was admitted to [facility]. CTs of the head revealed no abnormality. TIA was suspected. The patient also has a history of B12 deficiency and did report improvement in memory after starting B12 injections.   The patient reportedly had neuropsychological evaluation performed by Dr. Valentina Shaggy in January 2016; however, these records are not available for my review. According to Dr. Georgie Chard notes, his neuropsychological profile wasconsistent with righthemishperestroke, with deficits in visual processing speed, non-dominanthand fine motor coordination/speed, and visual-spatialdysfunction.   On 03/03/2016, at his visit with Dr. Tomi Likens, he scored 22/30 on the MoCA. At that time, he reported steady decline over last year in physical functioning. His wife reported staring spells of brief duration, and an EEG has been scheduled. The patient endorsed depression secondary to reduced physical functioning.   At today's appointment, the patient complains of short term memory difficulties, which he believes predated his stroke. (His records do indicate  subjective cognitive complaints upon his first visit with Dr. Tomi Likens in 02/2013-prior to his stroke.) He believes his memory difficulties have worsened over time. When asked about recent  staring spells, he reported that in the middle of a task, such as getting dressed or putting on his shoes, he will find himself not knowing what he was just doing.  Upon direct questioning, the patient reported the following:   Forgetting recent conversations/events: Yes Repeating statements/questions: Yes Misplacing/losing items: Yes Forgetting appointments or other obligations: Yes (wife does)  Forgetting to take medications: Yes (wife does)  Difficulty concentrating: Yes Starting but not finishing tasks: Yes Distracted easily: Yes Processing information more slowly: Yes  Word-finding difficulty: Yes Writing difficulty: No Spelling difficulty: No Comprehension difficulty: Sometimes  Getting lost when driving: No Making wrong turns when driving: No Uncertain about directions when driving or passenger: No Any MVAs in the past year: No  Christopher Baker lives with his wife in their own home. 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 He denies any problems with this. He continues to drive. His wife manages his appointments and his medications. He assists his wife with meal preparation.  Physically, the patient reports reduced functioning secondary to a bad back/hip. He has to use a cane to ambulate. His balance is "way off". He has not had any recent falls prior to this appointment. He reported "near falls" though. [NOTE: The patient sustained a fall in the waiting room after leaving my office on 03/11/2016. He apparently fell forward onto the carpet and did hit his face and bloodied his nose. Nurses came to his attention immediately, and Dr. Tomi Likens evaluated him prior to his leaving our office.]   He has been doing physical therapy which he feels is helpful.  He has been unable to play golf in about two years. As a result of reduced physical ability, he is feeling depressed. He reported some past history of depression as well. He has never had mental health treatment, however. He denied any significant anxiety or psychosocial stress. He endorsed significant sleep difficulty (mostly with staying asleep). He does have a CPAP which he tries to use nightly. His appetite is adequate and improved. He denies suicidal ideation or intention. He was recently tapered off citalopram and has started sertraline to see if he has a better response to this.  Of note, he endorsed visual illusions or hallucinations. He reported that he has been seeing "shadows" and will think he saw someone running through the door in his peripheral vision. He finds this disconcerting. He thinks it seems to be improving since he underwent cataract surgery recently.  Also of note, the patient's wife reported at his testing appointment on 03/15/2016, that recent labwork revealed very low level of vitamin B12.  Interim History/Current Functioning [03/21/2017]: Head CT completed on 03/31/2016 showed progressive atrophy since 10/31/2014, chronic ischemic changes, and ill-defined hypodensity right parietal cortex that could represent subacute or progression of chronic ischemia. Since there was no corresponding stroke to account for the language impairment on neuropsychological testing, and repeat head CT showed progression of generalized cerebral atrophy, underlying AD in addition to vascular dementia was thought to be possible. The patient was last seen by Dr. Tomi Likens on 09/23/2016. B12 had returned to normal. He responded well to Aricept.   The patient is unaccompanied to the visit today and has some trouble providing interim history, noting that his wife is his "memory bank". He reported he continues to have memory issues but does not think they have progressed over the past year since he was last  evaluated. The most bothersome thing for him right  now is his chronic back pain. He is hoping to get a TENS unit in the future and have some relief with that. He continues to do PT, work with a Clinical research associate at [employer] and do home exercises.  He does not think he has had any recent falls. He does recall falling one time in the past year and EMS had to get him off the floor. His mood has been an issue recently. He has been very irritable. He states he gets mad at the TV and mad at his wife, and this is not like him. He wants to pursue psychotherapy to help mood. He admits feeling more down in general, due to his back problems and inability to play golf. He would like me to put in a referral to [facility].  His wife continues to do most of the driving. She also manages the medications and appointments. He is having more trouble with managing finances/calculations. He couldn't figure out the tip last night when they were out to dinner.   He thinks he is sleeping well, does not know how much sleep he gets per night. He is using his CPAP faithfully. His appetite is good.  He was in the hospital last month for three days with AFib. He is now on Tikosyn which "did the trick".   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 He reports they recently dissolved the company. Marital history: Married to his current wife x39 years. This is his third marriage. Two children (in their 58s), four grandchildren. Alcohol/Tobacco/Substances: Occasional alcohol. "I do enjoy an evening cocktail." (one drink per night)Never has been a heavy drinker. Former smoker (quit at least 20 years ago). No substance abuse.   Medical History: Past Medical History:  Diagnosis Date  . Anxiety   . Arthritis     "hands, right hip; lower back" (02/28/2017)  . Arthritis with psoriasis ([place])   . Borderline diabetes    DIET CONTROLLED - PT STATES HE IS NOT DIABETIC  . Carotid stenosis, bilateral PER DR EARLY / DUPLEX  09-09-10     40 - 59%   BILATERALLY--- ASYMPTOMATIC  . Chronic lower back pain   . Depression   . Dysrhythmia    a-fib  . GERD (gastroesophageal reflux disease)    CONTROLLED W/ PROTONIX  . History of gout   . HTN (hypertension)   . Hyperlipidemia   . Iron deficiency   . Lumbar spondylosis W/ RADICULOPATHY  . OSA on CPAP   . PAF (paroxysmal atrial fibrillation) ([place])   . Presence of permanent cardiac pacemaker DDD-  06-04-10-   DDD; SECONDARY TO SYNCOPY AND BRADYCARDIA  . Prostate cancer ([place]) 2008   S/P 71 RADIATION  [place]    . Restless leg syndrome   . Stroke [facility]) Oct. 14, 2014   light left hand, balance issues, short term memory loss 2014  . SVT (supraventricular tachycardia) ([place])    S/P ABLATION   2008     Current Medications:  Outpatient Encounter Prescriptions as of 03/21/2017  Medication Sig  . acetaminophen (TYLENOL) 500 MG tablet Take 1,000 mg by mouth 2 (two) times daily.  Marland Kitchen amoxicillin (AMOXIL) 500 MG capsule Take 2,000 mg by mouth See admin instructions. Take 4 tablets (1000 mg) by mouth one hour prior  to dental apointments  . atorvastatin (LIPITOR) 20 MG tablet Take 20 mg by mouth daily.  . cyanocobalamin (,VITAMIN B-12,) 1000 MCG/ML injection Inject 1,000 mcg into the muscle every 30 (thirty) days. Last injection 02/27/17  . dofetilide (TIKOSYN) 500 MCG capsule Take 1 capsule (500 mcg total) by mouth 2 (two) times daily.  Marland Kitchen donepezil (ARICEPT) 10 MG tablet TAKE ONE TABLET BY MOUTH EVERY NIGHT AT BEDTIME (Patient taking differently: TAKE ONE TABLET (10 MG) BY MOUTH EVERY NIGHT AT BEDTIME)  . DULoxetine (CYMBALTA) 30 MG capsule Take 1 capsule (30 mg total) by mouth daily.  Marland Kitchen ELIQUIS 5 MG TABS tablet TAKE 1 TABLET (5 MG TOTAL) BY MOUTH 2 (TWO) TIMES DAILY.  Marland Kitchen  ezetimibe (ZETIA) 10 MG tablet Take 1 tablet (10 mg total) by mouth daily.  . ferrous sulfate 325 (65 FE) MG tablet Take 325 mg by mouth at bedtime.  . folic acid (FOLVITE) 1 MG tablet Take 1 mg by mouth at bedtime.   . furosemide (LASIX) 40 MG tablet Take 40 mg by mouth See admin instructions. Take 1 tablet (40 mg) by mouth every morning, may take an additional tablet if needed for swelling/edema  . lisinopril (PRINIVIL,ZESTRIL) 5 MG tablet TAKE ONE TABLET BY MOUTH DAILY (Patient taking differently: TAKE ONE TABLET (5 MG) BY MOUTH DAILY AT BEDTIME)  . loperamide (IMODIUM A-D) 2 MG tablet Take 2 mg by mouth daily.  . methotrexate (RHEUMATREX) 2.5 MG tablet Take 4 tablets (10 mg total) by mouth once a week. Caution:Chemotherapy. Protect from light. Every monday (Patient taking differently: Take 15 mg by mouth every Monday. Caution:Chemotherapy. Protect from light. Every monday)  . metoprolol succinate (TOPROL XL) 25 MG 24 hr tablet Take 1 tablet (25 mg total) by mouth daily. (Patient taking differently: Take 25 mg by mouth at bedtime. )  . Misc. Devices (STEP N REST WALKER) MISC Use as directed  . OVER THE COUNTER MEDICATION Place 1 drop into both eyes 2 (two) times daily as needed (dry eyes). Over the counter lubricant eye drops  . oxybutynin (DITROPAN) 5 MG tablet Take 5 mg by mouth 2 (two) times daily.   . pantoprazole (PROTONIX) 40 MG tablet Take 1 tablet (40 mg total) by mouth daily. (Patient taking differently: Take 40 mg by mouth at bedtime. )  . predniSONE (DELTASONE) 5 MG tablet Take 5 mg by mouth daily.  Marland Kitchen PRESCRIPTION MEDICATION Inhale into the lungs at bedtime. CPAP  . Vitamin D, Ergocalciferol, (DRISDOL) 50000 units CAPS capsule Take 50,000 Units by mouth every Sunday.    No facility-administered encounter medications on file as of 03/21/2017.      Behavioral Observations:   Appearance: Casually and appropriately dressed and groomed Gait: Ambulated with a rolling walker Speech:  Fluent; normal rate, rhythm and volume. Mild word finding difficulty. Thought process: Linear, goal directed Affect: Full, bright, euthymic Interpersonal: Very pleasant, appropriate   TESTING: There is medical necessity to proceed with neuropsychological assessment as the results will be used to aid in differential diagnosis and clinical decision-making and to inform specific treatment recommendations. Per the patient and medical records reviewed, there are ongoing deficits in cognitive functioning and a reasonable suspicion of dementia, with a need for re-assessment to assist in determining etiology (e.g., vascular dementia, AD, mixed dementia).  Following the clinical interview, the patient completed a full battery of neuropsychological testing with my psychometrician under my supervision.   PLAN: The patient will return to see me for a follow-up session at which  time his test performances and my impressions and treatment recommendations will be reviewed in detail.  Full report to follow.

## 2017-03-21 NOTE — Progress Notes (Signed)
   Neuropsychology Note  Christopher Baker came in today for 1 hour of neuropsychological testing with technician, Milana Kidney, BS, under the supervision of Dr. Macarthur Critchley. The patient did not appear overtly distressed by the testing session, per behavioral observation or via self-report to the technician. Rest breaks were offered. Christopher Baker will return within 2 weeks for a feedback session with Dr. Si Raider at which time his test performances, clinical impressions and treatment recommendations will be reviewed in detail. The patient understands he can contact our office should he require our assistance before this time.  Full report to follow.

## 2017-03-25 DIAGNOSIS — D692 Other nonthrombocytopenic purpura: Secondary | ICD-10-CM | POA: Diagnosis not present

## 2017-03-25 DIAGNOSIS — L218 Other seborrheic dermatitis: Secondary | ICD-10-CM | POA: Diagnosis not present

## 2017-03-25 DIAGNOSIS — Z85828 Personal history of other malignant neoplasm of skin: Secondary | ICD-10-CM | POA: Diagnosis not present

## 2017-03-25 DIAGNOSIS — L821 Other seborrheic keratosis: Secondary | ICD-10-CM | POA: Diagnosis not present

## 2017-03-27 NOTE — Progress Notes (Signed)
NEUROPSYCHOLOGICAL EVALUATION   Name:    Christopher Baker  Date of Birth:   1933/07/25 Date of Interview:  03/21/2017 Date of Testing:  03/21/2017   Date of Feedback:  03/28/2017       Background Information:  Reason for Referral:  Christopher Baker is a 83 y.o. right handed male referred by Dr. Metta Clines to assess his current level of cognitive functioning and assist in differential diagnosis. The current evaluation consisted of a review of available medical records, an interview with the patient, and the completion of a neuropsychological testing battery. Informed consent was obtained.  I previously completed neuropsychological testing with Christopher Baker in 02/2016. My clinical impressions at that time were as follows: Mild dementia, unspecified, likely multifactorial (cerebrovascular disease, vitamin B12 deficiency, sleep apnea, rule-out left hemisphere stroke/multiple infarcts); Adjustment disorder with mixed depression and anxiety. The patient's current cognitive profile, interestingly, is indicative of more left hemisphere involvement than right, which is surprising given hishistory of right-hemispherestroke in 03/2013 and thereported findings from previous neuropsychologicalevaluationin 06/2014 (these records unfortunately not available for my review). 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 It seems that his visual-spatial functioning has clearly improved, which is surprisinggiven that testing in 06/2014 still apparently showed trouble in this domain (as most improvement is typically seen in the one year post stroke). Meanwhile, a decline in verbal memory and language since his last evaluation is apparent, raising suspicion for more recent left hemisphere involvement. I suspect vascular damage in the left hemisphere over Alzheimer's disease, given that his  non-verbal memory is so intact and his overall cognitive profile is so lateralizing. There is also likely some degree of frontal-subcortical dysfunction related to cerebrovascular disease in general. Low vitamin B12 and inadequately treatedsleep apnea are also likely contributing to cognitive dysfunction.  Overall, I would call this dementia based on the level of impairment in verbal memory and semantic retrieval, but he clearly has high cognitive reserve and is able to function relatively well in spite of these deficits. I am hopeful that cognition may be enhanced with vitamin B12 supplementation and more consistent CPAP treatment. The patient is also presenting with adjustment reaction to physical limitations and declining function.  History of Presenting Problem [per my 03/11/2016 visit with the patient and his wife]:  Christopher Baker sustained a cardio-embolic stroke in 94/8546 with left hand and arm weakness and facial droop. CT of the head revealed right basal ganglia and parietal lobe infarcts. In 05/2014, he had an episode of confusion and left facial droop and was admitted to [facility]. CTs of the head revealed no abnormality. TIA was suspected. The patient also has a history of B12 deficiency and did report improvement in memory after starting B12 injections.   The patient reportedly had neuropsychological evaluation performed by Dr. Valentina Shaggy in January 2016; however, these records are not available for my review. According to Dr. Georgie Chard notes, his neuropsychological profile wasconsistent with righthemishperestroke, with deficits in visual processing speed, non-dominanthand fine motor coordination/speed, and visual-spatialdysfunction.   On 03/03/2016, at his visit with Dr. Tomi Likens, he scored 22/30 on the MoCA. At that time, he reported steady decline over last year in physical functioning. His wife reported staring spells of brief duration, and an EEG has been scheduled. The patient endorsed  depression secondary to reduced physical functioning.   At today's appointment, the patient complains of short term memory difficulties, which  he believes predated his stroke. (His records do indicate subjective cognitive complaints upon his first visit with Dr. Tomi Likens in 02/2013-prior to his stroke.) He believes his memory difficulties have worsened over time. When asked about recent staring spells, he reported that in the middle of a task, such as getting dressed or putting on his shoes, he will find himself not knowing what he was just doing.  Upon direct questioning, the patient reported the following:   Forgetting recent conversations/events: Yes Repeating statements/questions: Yes Misplacing/losing items: Yes Forgetting appointments or other obligations: Yes (wife does)  Forgetting to take medications: Yes (wife does)  Difficulty concentrating: Yes Starting but not finishing tasks: Yes Distracted easily: Yes Processing information more slowly: Yes  Word-finding difficulty: Yes Writing difficulty: No Spelling difficulty: No Comprehension difficulty: Sometimes  Getting lost when driving: No Making wrong turns when driving: No Uncertain about directions when driving or passenger: No Any MVAs in the past year: No  Christopher Baker lives with his wife in their own home. 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 He denies any problems with this. He continues to drive. His wife manages his appointments and his medications. He assists his wife with meal preparation.  Physically, the patient reports reduced functioning secondary to a bad back/hip. He has to use a cane to ambulate. His balance is "way off". He has not had any recent falls prior to this appointment. He reported "near falls" though. [NOTE: The patient sustained a fall in the waiting room after leaving my office on 03/11/2016. He apparently fell forward onto the carpet and did  hit his face and bloodied his nose. Nurses came to his attention immediately, and Dr. Tomi Likens evaluated him prior to his leaving our office.]   He has been doing physical therapy which he feels is helpful. He has been unable to play golf in about two years. As a result of reduced physical ability, he is feeling depressed. He reported some past history of depression as well. He has never had mental health treatment, however. He denied any significant anxiety or psychosocial stress. He endorsed significant sleep difficulty (mostly with staying asleep). He does have a CPAP which he tries to use nightly. His appetite is adequate and improved. He denies suicidal ideation or intention. He was recently tapered off citalopram and has started sertraline to see if he has a better response to this.  Of note, he endorsed visual illusions or hallucinations. He reported that he has been seeing "shadows" and will think he saw someone running through the door in his peripheral vision. He finds this disconcerting. He thinks it seems to be improving since he underwent cataract surgery recently.  Also of note, the patient's wife reported at his testing appointment on 03/15/2016, that recent labwork revealed very low level of vitamin B12.  Interim History/Current Functioning [03/21/2017]: Head CT completed on 03/31/2016 showed progressive atrophy since 10/31/2014, chronic ischemic changes, and ill-defined hypodensity right parietal cortex that could represent subacute or progression of chronic ischemia. Since there was no corresponding stroke to account for the language impairment on neuropsychological testing, and repeat head CT showed progression of generalized cerebral atrophy, underlying AD in addition to vascular dementia was thought to be possible. The patient was last seen by Dr. Tomi Likens on 09/23/2016. B12 had returned to normal. He responded well to Aricept.   The patient is unaccompanied to the visit today and has some  trouble providing interim history, noting that  his wife is his "memory bank". He reported he continues to have memory issues but does not think they have progressed over the past year since he was last evaluated. The most bothersome thing for him right now is his chronic back pain. He is hoping to get a TENS unit in the future and have some relief with that. He continues to do PT, work with a Clinical research associate at [employer] and do home exercises.  He does not think he has had any recent falls. He does recall falling one time in the past year and EMS had to get him off the floor. His mood has been an issue recently. He has been very irritable. He states he gets mad at the TV and mad at his wife, and this is not like him. He wants to pursue psychotherapy to help mood. He admits feeling more down in general, due to his back problems and inability to play golf. He would like me to put in a referral to [facility].  His wife continues to do most of the driving. She also manages the medications and appointments. He is having more trouble with managing finances/calculations. He couldn't figure out the tip last night when they were out to dinner.   He thinks he is sleeping well, does not know how much sleep he gets per night. He is using his CPAP faithfully. His appetite is good.  He was in the hospital last month for three days with AFib. He is now on Tikosyn which "did the trick".   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 He reports they recently dissolved the company. Marital history: Married to his current wife x39 years. This is his third marriage. Two children (in their 35s), four grandchildren. Alcohol/Tobacco/Substances: Occasional alcohol. "I do enjoy an evening cocktail."  (one drink per night)Never has been a heavy drinker. Former smoker (quit at least 20 years ago). No substance abuse.   Medical History:  Past Medical History:  Diagnosis Date  . Anxiety   . Arthritis    "hands, right hip; lower back" (02/28/2017)  . Arthritis with psoriasis ([place])   . Borderline diabetes    DIET CONTROLLED - PT STATES HE IS NOT DIABETIC  . Carotid stenosis, bilateral PER DR EARLY / DUPLEX  09-09-10     40 - 59%   BILATERALLY--- ASYMPTOMATIC  . Chronic lower back pain   . Depression   . Dysrhythmia    a-fib  . GERD (gastroesophageal reflux disease)    CONTROLLED W/ PROTONIX  . History of gout   . HTN (hypertension)   . Hyperlipidemia   . Iron deficiency   . Lumbar spondylosis W/ RADICULOPATHY  . OSA on CPAP   . PAF (paroxysmal atrial fibrillation) ([place])   . Presence of permanent cardiac pacemaker DDD-  06-04-10-   DDD; SECONDARY TO SYNCOPY AND BRADYCARDIA  . Prostate cancer ([place]) 2008   S/P 50 RADIATION  [place]    . Restless leg syndrome   . Stroke [facility]) Oct. 14, 2014   light left hand, balance issues, short term memory loss 2014  . SVT (supraventricular tachycardia) ([place])    S/P ABLATION   2008    Current medications:  Outpatient Encounter Prescriptions as of 03/28/2017  Medication Sig  . acetaminophen (TYLENOL)  500 MG tablet Take 1,000 mg by mouth 2 (two) times daily.  Marland Kitchen amoxicillin (AMOXIL) 500 MG capsule Take 2,000 mg by mouth See admin instructions. Take 4 tablets (1000 mg) by mouth one hour prior to dental apointments  . atorvastatin (LIPITOR) 20 MG tablet Take 20 mg by mouth daily.  . cyanocobalamin (,VITAMIN B-12,) 1000 MCG/ML injection Inject 1,000 mcg into the muscle every 30 (thirty) days. Last injection 02/27/17  . dofetilide (TIKOSYN) 500 MCG capsule Take 1 capsule (500 mcg total) by mouth 2 (two) times daily.  Marland Kitchen donepezil (ARICEPT) 10 MG tablet TAKE ONE TABLET BY MOUTH EVERY NIGHT AT BEDTIME (Patient taking differently: TAKE ONE TABLET (10 MG) BY  MOUTH EVERY NIGHT AT BEDTIME)  . DULoxetine (CYMBALTA) 30 MG capsule Take 1 capsule (30 mg total) by mouth daily.  Marland Kitchen ELIQUIS 5 MG TABS tablet TAKE 1 TABLET (5 MG TOTAL) BY MOUTH 2 (TWO) TIMES DAILY.  Marland Kitchen ezetimibe (ZETIA) 10 MG tablet Take 1 tablet (10 mg total) by mouth daily.  . ferrous sulfate 325 (65 FE) MG tablet Take 325 mg by mouth at bedtime.  . folic acid (FOLVITE) 1 MG tablet Take 1 mg by mouth at bedtime.   . furosemide (LASIX) 40 MG tablet Take 40 mg by mouth See admin instructions. Take 1 tablet (40 mg) by mouth every morning, may take an additional tablet if needed for swelling/edema  . lisinopril (PRINIVIL,ZESTRIL) 5 MG tablet TAKE ONE TABLET BY MOUTH DAILY (Patient taking differently: TAKE ONE TABLET (5 MG) BY MOUTH DAILY AT BEDTIME)  . loperamide (IMODIUM A-D) 2 MG tablet Take 2 mg by mouth daily.  . methotrexate (RHEUMATREX) 2.5 MG tablet Take 4 tablets (10 mg total) by mouth once a week. Caution:Chemotherapy. Protect from light. Every monday (Patient taking differently: Take 15 mg by mouth every Monday. Caution:Chemotherapy. Protect from light. Every monday)  . metoprolol succinate (TOPROL XL) 25 MG 24 hr tablet Take 1 tablet (25 mg total) by mouth daily. (Patient taking differently: Take 25 mg by mouth at bedtime. )  . Misc. Devices (STEP N REST WALKER) MISC Use as directed  . OVER THE COUNTER MEDICATION Place 1 drop into both eyes 2 (two) times daily as needed (dry eyes). Over the counter lubricant eye drops  . oxybutynin (DITROPAN) 5 MG tablet Take 5 mg by mouth 2 (two) times daily.   . pantoprazole (PROTONIX) 40 MG tablet Take 1 tablet (40 mg total) by mouth daily. (Patient taking differently: Take 40 mg by mouth at bedtime. )  . predniSONE (DELTASONE) 5 MG tablet Take 5 mg by mouth daily.  Marland Kitchen PRESCRIPTION MEDICATION Inhale into the lungs at bedtime. CPAP  . Vitamin D, Ergocalciferol, (DRISDOL) 50000 units CAPS capsule Take 50,000 Units by mouth every Sunday.    No  facility-administered encounter medications on file as of 03/28/2017.      Current Examination:  Behavioral Observations:   Appearance: Casually and appropriately dressed and groomed Gait: Ambulated with a rolling walker Speech: Fluent; normal rate, rhythm and volume. Mild word finding difficulty. Thought process: Linear, goal directed Affect: Full, bright, euthymic Interpersonal: Very pleasant, appropriate Orientation: Oriented to person, place and most aspects of time (did not know the current date). Accurately named the current President and his predecessor.    Tests Administered: . Test of Premorbid Functioning (TOPF) . Wechsler Adult Intelligence Scale-Fourth Edition (WAIS-IV): Similarities, Music therapist, Coding and Digit Span subtests . [employer] Verbal Learning Test - 2nd Edition (CVLT-2) Short Form . Repeatable Battery  for the Assessment of Neuropsychological Status (RBANS) Form A: Story Memory and Story Recall subtests, Figure Copy and Recall subtests and Semantic Fluency subtest . Neuropsychological Assessment Battery (NAB) Language Module, Form 1: Naming subtest . [place] Diagnostic Aphasia Examination: Complex Ideational Material subtest . Controlled Oral Word Association Test (COWAT) . Trail Making Test A and B . Clock drawing test . Geriatric Depression Scale (GDS) 15 Item . Generalized Anxiety Disorder - 7 item screener (GAD-7)  Test Results: Note: Standardized scores are presented only for use by appropriately trained professionals and to allow for any future test-retest comparison. These scores should not be interpreted without consideration of all the information that is contained in the rest of the report. The most recent standardization samples from the test publisher or other sources were used whenever possible to derive standard scores; scores were corrected for age, gender, ethnicity and education when available.   Test Scores:  Test Name Raw Score Standardized  Score Descriptor  TOPF 64/70 SS= 123 Superior  WAIS-IV Subtests     Similarities 29/36 ss= 14 Superior  Block Design 24/66 ss= 10 Average  Coding 39/135 ss= 10 Average   Digit Span Forward 12/16 ss= 14 Superior  Digit Span Backward 9/16 ss= 13 High average  RBANS Subtests     Story Memory 17/24 Z= 0.4 Average  Story Recall 8/12 Z= 0.2 Average  Figure Copy 20/20 Z= 1.4 Superior  Figure Recall 19/20 Z= 1.9 Superior  Semantic Fluency 10 Z= -2 Impaired  CVLT-II Scores     Trial 1 3/9 Z= -2 Impaired  Trial 4 8/9 Z= 2 Very superior  Trials 1-4 total 20/36 T= 50 Average  SD Free Recall 5/9 Z= -0.5 Average  LD Free Recall 5/9 Z= 0 Average  LD Cued Recall 7/9 Z= 1.5 Superior  Recognition Discriminability 9/9 hits, 3 false positives Z= 0.5 Average  Forced Choice Recognition 9/9  WNL  NAB Naming 25/31 T= 36 Borderline  BDAE Complex Ideational Material 10/12  Below expectation  COWAT-FAS 38 T= 51 Average  COWAT-Animals 13 T= 42 Low average  Trail Making Test A  37" 0 errors T= 59 High average  Trail Making Test B  122" 1 error T= 54 Average  Clock Drawing   WNL  GDS-15 7/15  Mild  GAD-7 17/21  Severe      Description of Test Results:  Premorbid verbal intellectual abilities were estimated to have been within the superior range based on a test of word reading. Psychomotor processing speed was average. Auditory attention and working memory were superior to high average, respectively. Visual-spatial construction was intact, ranging from average to superior on various tasks. Language abilities were below expectation. Specifically, confrontation naming was borderline, and semantic verbal fluency ranged from low average to impaired. Auditory comprehension of complex ideational material was slightly below expectation. With regard to verbal memory, encoding and acquisition of non-contextual information (i.e., word list) was average. After a brief distracter task, free recall was average. After  a delay, free recall was average. Cued recall was superior. Performance on a yes/no recognition task was average. On another verbal memory test, encoding and acquisition of contextual auditory information (i.e., short stories) was average. After a delay, free recall was average. With regard to non-verbal memory, delayed free recall of visual information was superior. Executive functioning was within normal limits overall. Mental flexibility and set-shifting were average on Trails B. Verbal fluency with phonemic search restrictions was average. Verbal abstract reasoning was superior. Performance on a clock  drawing task was normal.   On self-report measures of mood, the patient's responses were indicative of mild depression and severe generalized anxiety at the present time.    Clinical Impressions: Mild dementia, unspecified, with significant improvement on objective testing since last evaluation. Relative to neurocognitive testing completed just over a year ago, the patient demonstrated significant improvement across multiple domains including learning and memory, processing speed, attention, language and executive functioning.  Semantic fluency and confrontation naming continue to be areas of relative weakness, but otherwise all aspects of testing were within normal limits for age with many ares in the high average and superior range for age (and thus also consistent with estimated premorbid intellectual abilities). These testing results show no evidence of interval decline over the past year. Instead, there is evidence of probable improvement in cognitive functioning since 02/2016. The etiology of persisting cognitive impairment remains unclear. Vascular etiology is certainly possible given history of stroke and probable small vessel disease. However, such a significant improvement on cognitive testing in one year is atypical.  His wife asks about the possibility of NPH given the rapid decline in gait,  significant increase in urinary incontinence and cognitive/personality changes. They will speak to Dr. Tomi Likens further about this and any further evaluation that may need to be indicated to rule out other possible etiologies. In order to maintain brain health and reduce the risk of cognitive decline, I recommend that he continue Aricept, continue compliance with CPAP, continue physical exercise and ongoing optimal control of vascular risk factors. Unfortunately, the patient's anxiety has increased and he does report increased irritability and mood disturbance. This has greatly affected his marriage over the past year. I have already referred him to [facility] for psychotherapy and I do believe he would benefit from CBT, especially given these testing results. He is scheduled to see Trey Paula on 05/04/2017.   Feedback to Patient: Christopher Baker and his wife returned for a feedback appointment on 03/28/2017 to review the results of his neuropsychological evaluation with this provider. 45 minutes face-to-face time was spent reviewing his test results, my impressions and my recommendations as detailed above.    Total time spent on this patient's case: [id number] unit for interview with psychologist; [phone] units of testing by psychometrician under psychologist's supervision; [phone] units for medical record review, scoring of neuropsychological tests, interpretation of test results, preparation of this report, and review of results to the patient by psychologist.      Thank you for your referral of Christopher Baker. Please feel free to contact me if you have any questions or concerns regarding this report.

## 2017-03-28 ENCOUNTER — Encounter: Payer: Self-pay | Admitting: Psychology

## 2017-03-28 ENCOUNTER — Ambulatory Visit (INDEPENDENT_AMBULATORY_CARE_PROVIDER_SITE_OTHER): Payer: PPO | Admitting: Psychology

## 2017-03-28 DIAGNOSIS — F0390 Unspecified dementia without behavioral disturbance: Secondary | ICD-10-CM | POA: Diagnosis not present

## 2017-03-30 ENCOUNTER — Encounter: Payer: Self-pay | Admitting: Neurology

## 2017-03-30 ENCOUNTER — Ambulatory Visit (INDEPENDENT_AMBULATORY_CARE_PROVIDER_SITE_OTHER): Payer: PPO | Admitting: Neurology

## 2017-03-30 VITALS — BP 144/64 | HR 60 | Ht 75.0 in | Wt 222.3 lb

## 2017-03-30 DIAGNOSIS — F329 Major depressive disorder, single episode, unspecified: Secondary | ICD-10-CM

## 2017-03-30 DIAGNOSIS — F0390 Unspecified dementia without behavioral disturbance: Secondary | ICD-10-CM

## 2017-03-30 DIAGNOSIS — F32A Depression, unspecified: Secondary | ICD-10-CM

## 2017-03-30 DIAGNOSIS — I679 Cerebrovascular disease, unspecified: Secondary | ICD-10-CM

## 2017-03-30 NOTE — Progress Notes (Signed)
NEUROLOGY FOLLOW UP OFFICE NOTE  MARUICE PIERONI [id number]  HISTORY OF PRESENT ILLNESS: Christopher Baker is an 83 year old right-handed man with hypertension, hyperlipidemia, paroxysmal atrial fibrillation, PPM implant for syncope, and history of prostate cancer status post radiation in 2007, who follows up for vascular dementia and falls.  He is accompanied by his wife who supplements history.   UPDATE: He is taking Aricept.  He is getting monthly B12 shots.  He is now using his CPAP regularly.    His gait has further deteriorated.  He has moved from a cane to a walker.  He has history of urinary incontinence since radiation therapy for prostate cancer, which has since gotten worse.  He reports increased anxiety.  His mood has changed.  He is more irritable and argumentative.  He is also more socially withdrawn and has less concern for his personal appearance.  This has impacted his marriage.  Memory is still an issue.  He frequently loses his wallet, keys or cell phone.  However, other friends and family have noted improvement in cognition over the past several months.  He underwent repeat neuropsychological testing with Dr. Si Raider on 03/21/17.  Testing did still demonstrate mild dementia but with significant improvement across multiple domains when compared to testing from a year ago (including learning and memory, processing speed, attention, language and executive functioning).  His wife questions the possibility of NPH, given his rapidly declining gait and urinary incontinence.   HISTORY: I.  Cardio-embolic stroke He was admitted to the hospital on 04/03/13 with left hand and arm weakness and facial droop.  CT head revealed right basal ganglia and parietal lobe infarcts.  He was found to have atrial fibrillation.  He was started on Xarelto.  He was admitted to [facility] on 06/18/14 for confusion.  Episode lasted about 1 hour.  He was at dinner with his wife and had difficulty ordering from the  menu.  His wife noted a left facial droop.  He did not have any slurred speech, focal numbness or weakness of the extremities.  His blood pressure was found to be in the 190s/80s.  CT of head revealed no abnormality.  Repeated CT of the head the following day showed no acute stroke.  Echo showed mildly decreased EF of 45-50%.  Carotid doppler showed 40% bilateral ICA stenosis.  An EEG was performed, which was negative.  Differential diagnosis included TIA verse seizure and he was started on Keppra 250mg  twice daily.  He was also restarted on lisinopril, which had been discontinued earlier in the month.  Since discharge, he exhibited lethargy and irritability.  Keppra was subsequently discontinued as a TIA was suspected rather than partial seizure.   II. Vascular cognitive impairment Over the last 3 years, he has had problems with memory.  He was repeating things and exhibited mental fogginess and lack of motivation.  He was experiencing depression and insomnia as well.  Procedural tasks and executive functioning were intact.  He was found to have a B12 level of 52 and was started on B12 shots.  He and his wife had noticed improvement in his memory.  However, insomnia and depression were still both an issue.  He had been under a lot of stress.  He was started on Celexa for depression and Proson for insomnia.  Last year, he exhibited extreme lethargy, to the point where he was sleeping during the day, which he never previously done.  Sometimes, he was in such a deep  sleep, it required repeated yelling and shaking to wake him up.  He seemed more "foggy" again.  One time, he couldn't figure out where to put the ice cream in the refrigerator.  When his wife told him to put it in the freezer, he was placing it in the vegetable drawer.  He decided to stop the Proson, and he and his wife noticed significant improvement in lethargy.  He did note some improvement in memory since starting B12 supplementation.  Memory seemed  worse after the stroke, so cognitive deficits were thought to be also related to stroke.   He had a formal driving evaluation in July 2015.  He was told to drive with someone else in the car for a couple of months so he can regain confidence.  The only restriction was that he should not drive at night unless it is a well-lit road.  He has since started driving alone sometimes, but only locally and during daylight hours.     He had formal neuropsych testing performed by Dr. Valentina Shaggy in January 2016.  Profile was consistent with outcome of a right hemispheric stroke: deficits in visual processing speed, non-dominant hand fine motor coordination/speed, and visual spatial dysfunction.  He underwent repeat neuropsychological testing on 03/15/16, which revealed mild dementia, likely multifactorial (cerebrovascular, OSA, B12 deficiency).  He demonstrated frontal-subcortical involvement.  However, he demonstrated decline in verbal memory and language since his prior evaluation, suggesting left hemispheric involvement rather than right hemispheric involvement.  This appears to be more likely due to cerebrovascular disease rather than Alzheimer's.  He underwent neuropsychological testing on 03/15/16 with Dr. Si Raider, which revealed mild dementia, likely multifactorial (cerebrovascular, OSA, B12 deficiency).  He demonstrated frontal-subcortical involvement.  However, he demonstrated decline in verbal memory and language since his prior evaluation, suggesting left hemispheric involvement rather than right hemispheric involvement.  This appears to be more likely due to cerebrovascular disease rather than Alzheimer's.   III Staring Spells: He reportedly has brief staring spells, lasting just seconds.  Routine EEG was performed on 03/17/16, which showed some diffuse slowing but no epileptiform discharges.  24 hour ambulatory EEG from 03/30/16 to 03/31/16 was performed and demonstrated mild diffuse slowing but otherwise  unremarkable.  CT of head was performed on 03/31/16 was personally reviewed. It did not demonstrate a left hemispheric stroke, but it did show progressive atrophy compared to prior study from 10/31/14, and possible subacute or progression of chronic ischemia in the right parietal cortex.  Carotid doppler from 04/02/16 showed stable 40-59% left ICA stenosis but progressed right ICA stenosis at 60-79% (from 40-59% on 06/18/14).  Since starting B12, he feels better and more clear.   IV Tremor He notes mild tremor in his hands, noticeable when he holds a utensil or writes.  He first noticed this about a month or so ago.  He has some balance issues due to bilateral knee replacement surgeries.  No family history of dementia or tremor (possibly his mother had tremor).  He takes ropinirole for RLS   V Leg weakness/gait instability: He has had increased balance problems with low back pain with pain radiating down the right leg.  His legs feel weak.  Pain is worse when standing and walking.  He also notes right groin pain.  He was evaluated by his neurosurgeon and reportedly has arthritis but nothing surgical to be done.  He is followed by orthopedist and pain specialist.  He has had epidural injections as well as injection in the right hip,  which has only exacerbated pain.  PAST MEDICAL HISTORY: Past Medical History:  Diagnosis Date  . Anxiety   . Arthritis    "hands, right hip; lower back" (02/28/2017)  . Arthritis with psoriasis ([place])   . Borderline diabetes    DIET CONTROLLED - PT STATES HE IS NOT DIABETIC  . Carotid stenosis, bilateral PER DR EARLY / DUPLEX  09-09-10     40 - 59%   BILATERALLY--- ASYMPTOMATIC  . Chronic lower back pain   . Depression   . Dysrhythmia    a-fib  . GERD (gastroesophageal reflux disease)    CONTROLLED W/ PROTONIX  . History of gout   . HTN (hypertension)   . Hyperlipidemia   . Iron deficiency   . Lumbar spondylosis W/ RADICULOPATHY  . OSA on CPAP   . PAF  (paroxysmal atrial fibrillation) ([place])   . Presence of permanent cardiac pacemaker DDD-  06-04-10-   DDD; SECONDARY TO SYNCOPY AND BRADYCARDIA  . Prostate cancer ([place]) 2008   S/P 42 RADIATION  [place]    . Restless leg syndrome   . Stroke [facility]) Oct. 14, 2014   light left hand, balance issues, short term memory loss 2014  . SVT (supraventricular tachycardia) ([place])    S/P ABLATION   2008    MEDICATIONS: Current Outpatient Prescriptions on File Prior to Visit  Medication Sig Dispense Refill  . acetaminophen (TYLENOL) 500 MG tablet Take 1,000 mg by mouth 2 (two) times daily.    Marland Kitchen amoxicillin (AMOXIL) 500 MG capsule Take 2,000 mg by mouth See admin instructions. Take 4 tablets (1000 mg) by mouth one hour prior to dental apointments    . atorvastatin (LIPITOR) 20 MG tablet Take 20 mg by mouth daily.    . cyanocobalamin (,VITAMIN B-12,) 1000 MCG/ML injection Inject 1,000 mcg into the muscle every 30 (thirty) days. Last injection 02/27/17    . dofetilide (TIKOSYN) 500 MCG capsule Take 1 capsule (500 mcg total) by mouth 2 (two) times daily. 60 capsule 6  . donepezil (ARICEPT) 10 MG tablet TAKE ONE TABLET BY MOUTH EVERY NIGHT AT BEDTIME (Patient taking differently: TAKE ONE TABLET (10 MG) BY MOUTH EVERY NIGHT AT BEDTIME) 90 tablet 2  . DULoxetine (CYMBALTA) 30 MG capsule Take 1 capsule (30 mg total) by mouth daily. 30 capsule 0  . ELIQUIS 5 MG TABS tablet TAKE 1 TABLET (5 MG TOTAL) BY MOUTH 2 (TWO) TIMES DAILY. 60 tablet 5  . ezetimibe (ZETIA) 10 MG tablet Take 1 tablet (10 mg total) by mouth daily. 90 tablet 1  . ferrous sulfate 325 (65 FE) MG tablet Take 325 mg by mouth at bedtime.    . folic acid (FOLVITE) 1 MG tablet Take 1 mg by mouth at bedtime.     . furosemide (LASIX) 40 MG tablet Take 40 mg by mouth See admin instructions. Take 1 tablet (40 mg) by mouth every morning, may take an additional tablet if needed for swelling/edema    . lisinopril (PRINIVIL,ZESTRIL) 5 MG tablet TAKE ONE TABLET BY MOUTH  DAILY (Patient taking differently: TAKE ONE TABLET (5 MG) BY MOUTH DAILY AT BEDTIME) 90 tablet 3  . loperamide (IMODIUM A-D) 2 MG tablet Take 2 mg by mouth daily.    . methotrexate (RHEUMATREX) 2.5 MG tablet Take 4 tablets (10 mg total) by mouth once a week. Caution:Chemotherapy. Protect from light. Every monday (Patient taking differently: Take 15 mg by mouth every Monday. Caution:Chemotherapy. Protect from light. Every monday) 4 tablet 1  .  metoprolol succinate (TOPROL XL) 25 MG 24 hr tablet Take 1 tablet (25 mg total) by mouth daily. (Patient taking differently: Take 25 mg by mouth at bedtime. ) 90 tablet 1  . Misc. Devices (STEP N REST WALKER) MISC Use as directed 1 each 0  . OVER THE COUNTER MEDICATION Place 1 drop into both eyes 2 (two) times daily as needed (dry eyes). Over the counter lubricant eye drops    . oxybutynin (DITROPAN) 5 MG tablet Take 5 mg by mouth 2 (two) times daily.     . pantoprazole (PROTONIX) 40 MG tablet Take 1 tablet (40 mg total) by mouth daily. (Patient taking differently: Take 40 mg by mouth at bedtime. ) 90 tablet 3  . predniSONE (DELTASONE) 5 MG tablet Take 5 mg by mouth daily.    Marland Kitchen PRESCRIPTION MEDICATION Inhale into the lungs at bedtime. CPAP    . Vitamin D, Ergocalciferol, (DRISDOL) 50000 units CAPS capsule Take 50,000 Units by mouth every Sunday.      No current facility-administered medications on file prior to visit.     ALLERGIES: Allergies  Allergen Reactions  . Ciprofloxacin Nausea Only    Stomach ache  . Hydrocodone-Acetaminophen Other (See Comments)    Hallucinations in higher doses  . Other Other (See Comments)    Rash from neoprene wrap after knee surgery  . Shellfish Allergy Nausea And Vomiting    Reaction to scallops    FAMILY HISTORY: Family History  Problem Relation Age of Onset  . Stroke Mother   . Early death Father     SOCIAL HISTORY: Social History   Social History  . Marital status: Married    Spouse name: N/A  .  Number of children: N/A  . Years of education: N/A   Occupational History  . retired    Social History Main Topics  . Smoking status: Former Smoker    Packs/day: 1.00    Years: 45.00    Types: Cigarettes    Quit date: 12/07/1998  . Smokeless tobacco: Never Used  . Alcohol use Yes     Comment: 1 cocktail per evening  . Drug use: No  . Sexual activity: Not Currently    Partners: Female   Other Topics Concern  . Not on file   Social History Narrative  . No narrative on file    REVIEW OF SYSTEMS: Constitutional: No fevers, chills, or sweats, no generalized fatigue, change in appetite Eyes: No visual changes, double vision, eye pain Ear, nose and throat: No hearing loss, ear pain, nasal congestion, sore throat Cardiovascular: No chest pain, palpitations Respiratory:  No shortness of breath at rest or with exertion, wheezes GastrointestinaI: No nausea, vomiting, diarrhea, abdominal pain, fecal incontinence Genitourinary:  No dysuria, urinary retention or frequency Musculoskeletal:  back pain Integumentary: No rash, pruritus, skin lesions Neurological: as above Psychiatric: depression Endocrine: No palpitations, fatigue, diaphoresis, mood swings, change in appetite, change in weight, increased thirst Hematologic/Lymphatic:  No purpura, petechiae. Allergic/Immunologic: no itchy/runny eyes, nasal congestion, recent allergic reactions, rashes  PHYSICAL EXAM: Vitals:   03/30/17 1424  BP: (!) 144/64  Pulse: 60  SpO2: 97%   General: No acute distress.  Patient appears well-groomed.  normal body habitus. Head:  Normocephalic/atraumatic Eyes:  Fundi examined but not visualized Neck: supple, no paraspinal tenderness, full range of motion Heart:  Regular rate and rhythm Lungs:  Clear to auscultation bilaterally Back: No paraspinal tenderness Neurological Exam: alert and oriented to person, place, and time. Attention span and concentration  intact, delayed recall poor, remote  memory intact, fund of knowledge intact.  Speech fluent and not dysarthric, language intact.   CN II-XII intact. Bulk and tone normal, muscle strength 5/5 throughout.  Reduced finger-thumb tapping speed bilaterally.  Finger to nose testing intact with fine postural and kinetic tremor.  Reduced pinprick and vibration sensation in feet and hands.  Deep tendon reflexes trace throughout except absent in ankles and right patellar.  Short strides.  IMPRESSION: 1. Mild dementia, unspecified.  Etiology unclear.  Given the significant improvement in cognitive testing performance, a neurodegenerative disease such as Alzheimer's is unlikely.  Practice testing effect would not account for such a dramatic improvement.  I think it is still vascular related, but unusual to have such an improvement in cognitive testing.  Possibly the Aricept and treatment of OSA has contributed to improvement. 2.  I don't suspect normal pressure hydrocephalus.  Other factors can explain his symptoms.  He has longstanding history of bladder problems.  He has neuropathy and chronic pain in the back and knee, which contributes to his gait problems.  Also, he would be expected to have continued cognitive decline.  CT of head last year was not suggestive of NPH.  We can repeat it, however the diagnostic test would be high volume LP, which would require discontinuation of his Eliquis.  I think the risk of stroke outweighs benefits of performing an LP (if repeat CT would suggest NPH).  Therefore, we will not pursue repeat CT of head. 3.  Depression and anxiety.  PLAN: 1.  Agree with Dr. Marcia Brash referral to [facility].  He may benefit from CBT. 2.  Continue Aricept 3.  Agree with physical therapy to address back pain and gait. 4.  Follow up in 6 months.  27 minutes spent face to face with patient, over 50% spent discussing diagnosis and management.  Metta Clines, DO  CC:  Domenick Gong, MD

## 2017-03-30 NOTE — Patient Instructions (Signed)
1.  Continue Aricept 10mg  at bedtime 2.  Follow up with the therapist for depression 3.  Follow up with physical therapist for balance and back pain. 4.  Follow up with me in 6 months

## 2017-04-06 DIAGNOSIS — M5416 Radiculopathy, lumbar region: Secondary | ICD-10-CM | POA: Diagnosis not present

## 2017-04-06 DIAGNOSIS — M5136 Other intervertebral disc degeneration, lumbar region: Secondary | ICD-10-CM | POA: Diagnosis not present

## 2017-04-06 DIAGNOSIS — M961 Postlaminectomy syndrome, not elsewhere classified: Secondary | ICD-10-CM | POA: Diagnosis not present

## 2017-04-06 DIAGNOSIS — M47816 Spondylosis without myelopathy or radiculopathy, lumbar region: Secondary | ICD-10-CM | POA: Diagnosis not present

## 2017-04-11 ENCOUNTER — Telehealth: Payer: Self-pay | Admitting: Neurology

## 2017-04-11 NOTE — Telephone Encounter (Signed)
Pt left a voicemail message saying he needed a prescription called in but did not say what the prescription was

## 2017-04-12 DIAGNOSIS — Z8546 Personal history of malignant neoplasm of prostate: Secondary | ICD-10-CM | POA: Diagnosis not present

## 2017-04-12 DIAGNOSIS — R319 Hematuria, unspecified: Secondary | ICD-10-CM | POA: Diagnosis not present

## 2017-04-12 DIAGNOSIS — N529 Male erectile dysfunction, unspecified: Secondary | ICD-10-CM | POA: Diagnosis not present

## 2017-04-12 DIAGNOSIS — N3041 Irradiation cystitis with hematuria: Secondary | ICD-10-CM | POA: Diagnosis not present

## 2017-04-12 NOTE — Telephone Encounter (Signed)
Called and spoke with Pt, he wanted Cymbalta, advsd him we did not Rx that for him. He will call [facility] for refill

## 2017-04-13 ENCOUNTER — Other Ambulatory Visit: Payer: Self-pay | Admitting: *Deleted

## 2017-04-13 ENCOUNTER — Encounter: Payer: PPO | Admitting: Internal Medicine

## 2017-04-13 DIAGNOSIS — I6529 Occlusion and stenosis of unspecified carotid artery: Secondary | ICD-10-CM

## 2017-04-14 ENCOUNTER — Encounter: Payer: Self-pay | Admitting: Internal Medicine

## 2017-04-14 ENCOUNTER — Telehealth: Payer: Self-pay | Admitting: Neurology

## 2017-04-14 NOTE — Telephone Encounter (Signed)
Patient's wife called and needs a refill on Christopher Baker's Cymbalta. He was prescribed it while in the hospital in September. The hospital ddi not have a refill for it. Can this be called into [facility] on [address]. Please Call. Thanks

## 2017-04-14 NOTE — Telephone Encounter (Signed)
Dr Mathis Dad don't see where you have ever Rx'd this for this Pt. Do you want to refill this?

## 2017-04-15 ENCOUNTER — Other Ambulatory Visit: Payer: Self-pay

## 2017-04-15 MED ORDER — DULOXETINE HCL 30 MG PO CPEP: 30.0000 mg | ORAL_CAPSULE | Freq: Every day | ORAL | 3 refills | Status: DC

## 2017-04-15 NOTE — Telephone Encounter (Signed)
Cymbalta Rx sent to [facility].

## 2017-04-15 NOTE — Telephone Encounter (Signed)
Yes, we can refill it.

## 2017-05-03 DIAGNOSIS — M961 Postlaminectomy syndrome, not elsewhere classified: Secondary | ICD-10-CM | POA: Diagnosis not present

## 2017-05-03 DIAGNOSIS — M47816 Spondylosis without myelopathy or radiculopathy, lumbar region: Secondary | ICD-10-CM | POA: Diagnosis not present

## 2017-05-03 DIAGNOSIS — M5416 Radiculopathy, lumbar region: Secondary | ICD-10-CM | POA: Diagnosis not present

## 2017-05-03 DIAGNOSIS — M5136 Other intervertebral disc degeneration, lumbar region: Secondary | ICD-10-CM | POA: Diagnosis not present

## 2017-05-04 ENCOUNTER — Ambulatory Visit: Payer: PPO | Admitting: Psychology

## 2017-05-04 DIAGNOSIS — F320 Major depressive disorder, single episode, mild: Secondary | ICD-10-CM

## 2017-05-20 ENCOUNTER — Ambulatory Visit (INDEPENDENT_AMBULATORY_CARE_PROVIDER_SITE_OTHER): Payer: PPO | Admitting: Psychology

## 2017-05-20 DIAGNOSIS — F320 Major depressive disorder, single episode, mild: Secondary | ICD-10-CM

## 2017-05-23 ENCOUNTER — Ambulatory Visit: Payer: PPO | Admitting: Psychology

## 2017-06-08 DIAGNOSIS — M5136 Other intervertebral disc degeneration, lumbar region: Secondary | ICD-10-CM | POA: Diagnosis not present

## 2017-06-08 DIAGNOSIS — M545 Low back pain: Secondary | ICD-10-CM | POA: Diagnosis not present

## 2017-06-08 DIAGNOSIS — M2550 Pain in unspecified joint: Secondary | ICD-10-CM | POA: Diagnosis not present

## 2017-06-08 DIAGNOSIS — E663 Overweight: Secondary | ICD-10-CM | POA: Diagnosis not present

## 2017-06-08 DIAGNOSIS — L4059 Other psoriatic arthropathy: Secondary | ICD-10-CM | POA: Diagnosis not present

## 2017-06-08 DIAGNOSIS — Z6828 Body mass index (BMI) 28.0-28.9, adult: Secondary | ICD-10-CM | POA: Diagnosis not present

## 2017-06-20 ENCOUNTER — Other Ambulatory Visit: Payer: Self-pay | Admitting: Internal Medicine

## 2017-06-20 MED ORDER — METOPROLOL SUCCINATE ER 25 MG PO TB24: 25.0000 mg | ORAL_TABLET | Freq: Every day | ORAL | 2 refills | Status: DC

## 2017-06-22 DIAGNOSIS — G894 Chronic pain syndrome: Secondary | ICD-10-CM | POA: Diagnosis not present

## 2017-06-22 DIAGNOSIS — M961 Postlaminectomy syndrome, not elsewhere classified: Secondary | ICD-10-CM | POA: Diagnosis not present

## 2017-06-24 DIAGNOSIS — D519 Vitamin B12 deficiency anemia, unspecified: Secondary | ICD-10-CM | POA: Diagnosis not present

## 2017-06-24 DIAGNOSIS — I480 Paroxysmal atrial fibrillation: Secondary | ICD-10-CM | POA: Diagnosis not present

## 2017-06-24 DIAGNOSIS — E559 Vitamin D deficiency, unspecified: Secondary | ICD-10-CM | POA: Diagnosis not present

## 2017-06-24 DIAGNOSIS — I6990 Unspecified sequelae of unspecified cerebrovascular disease: Secondary | ICD-10-CM | POA: Diagnosis not present

## 2017-06-24 DIAGNOSIS — E46 Unspecified protein-calorie malnutrition: Secondary | ICD-10-CM | POA: Diagnosis not present

## 2017-06-24 DIAGNOSIS — H6123 Impacted cerumen, bilateral: Secondary | ICD-10-CM | POA: Diagnosis not present

## 2017-06-24 DIAGNOSIS — I6789 Other cerebrovascular disease: Secondary | ICD-10-CM | POA: Diagnosis not present

## 2017-06-24 DIAGNOSIS — I69959 Hemiplegia and hemiparesis following unspecified cerebrovascular disease affecting unspecified side: Secondary | ICD-10-CM | POA: Diagnosis not present

## 2017-06-24 DIAGNOSIS — D508 Other iron deficiency anemias: Secondary | ICD-10-CM | POA: Diagnosis not present

## 2017-06-24 DIAGNOSIS — G4733 Obstructive sleep apnea (adult) (pediatric): Secondary | ICD-10-CM | POA: Diagnosis not present

## 2017-06-24 DIAGNOSIS — I509 Heart failure, unspecified: Secondary | ICD-10-CM | POA: Diagnosis not present

## 2017-06-24 DIAGNOSIS — F0390 Unspecified dementia without behavioral disturbance: Secondary | ICD-10-CM | POA: Diagnosis not present

## 2017-06-24 DIAGNOSIS — Z6829 Body mass index (BMI) 29.0-29.9, adult: Secondary | ICD-10-CM | POA: Diagnosis not present

## 2017-06-27 ENCOUNTER — Ambulatory Visit (INDEPENDENT_AMBULATORY_CARE_PROVIDER_SITE_OTHER): Payer: PPO | Admitting: Psychology

## 2017-06-27 ENCOUNTER — Other Ambulatory Visit: Payer: Self-pay | Admitting: Internal Medicine

## 2017-06-27 DIAGNOSIS — F320 Major depressive disorder, single episode, mild: Secondary | ICD-10-CM

## 2017-06-27 NOTE — Telephone Encounter (Signed)
Eliquis 5mg  refill received; pt is 84 yrs old, wt-100.8kg, Crea-1.16 on 03/11/17 & last seen by Dr. Roderic Palau; will send in refill to requested Pharmacy.

## 2017-07-22 DIAGNOSIS — H903 Sensorineural hearing loss, bilateral: Secondary | ICD-10-CM | POA: Diagnosis not present

## 2017-07-28 ENCOUNTER — Other Ambulatory Visit: Payer: Self-pay | Admitting: Neurology

## 2017-07-29 ENCOUNTER — Ambulatory Visit (INDEPENDENT_AMBULATORY_CARE_PROVIDER_SITE_OTHER): Payer: PPO | Admitting: Psychology

## 2017-07-29 DIAGNOSIS — F320 Major depressive disorder, single episode, mild: Secondary | ICD-10-CM | POA: Diagnosis not present

## 2017-08-08 DIAGNOSIS — M545 Low back pain: Secondary | ICD-10-CM | POA: Diagnosis not present

## 2017-08-15 ENCOUNTER — Ambulatory Visit (INDEPENDENT_AMBULATORY_CARE_PROVIDER_SITE_OTHER): Payer: PPO | Admitting: *Deleted

## 2017-08-15 DIAGNOSIS — R001 Bradycardia, unspecified: Secondary | ICD-10-CM | POA: Diagnosis not present

## 2017-08-15 DIAGNOSIS — Z950 Presence of cardiac pacemaker: Secondary | ICD-10-CM

## 2017-08-15 DIAGNOSIS — I480 Paroxysmal atrial fibrillation: Secondary | ICD-10-CM

## 2017-08-15 LAB — CUP PACEART INCLINIC DEVICE CHECK
Battery Remaining Longevity: 61 mo
Brady Statistic AP VP Percent: 27 %
Brady Statistic AP VS Percent: 0 %
Brady Statistic AS VP Percent: 72 %
Brady Statistic AS VS Percent: 1 %
Date Time Interrogation Session: 20190225173546
Implantable Lead Implant Date: 20111215
Implantable Lead Location: 753859
Implantable Lead Model: 4076
Lead Channel Impedance Value: 370 Ohm
Lead Channel Impedance Value: 423 Ohm
Lead Channel Pacing Threshold Amplitude: 1.25 V
Lead Channel Pacing Threshold Amplitude: 1.5 V
Lead Channel Pacing Threshold Pulse Width: 0.4 ms
Lead Channel Sensing Intrinsic Amplitude: 2 mV
Lead Channel Setting Pacing Amplitude: 2 V
Lead Channel Setting Pacing Pulse Width: 0.64 ms
Lead Channel Setting Sensing Sensitivity: 2 mV
MDC IDC LEAD IMPLANT DT: 20111215
MDC IDC LEAD LOCATION: 753860
MDC IDC MSMT BATTERY IMPEDANCE: 791 Ohm
MDC IDC MSMT BATTERY VOLTAGE: 2.78 V
MDC IDC MSMT LEADCHNL RA PACING THRESHOLD AMPLITUDE: 0.75 V
MDC IDC MSMT LEADCHNL RA PACING THRESHOLD PULSEWIDTH: 0.4 ms
MDC IDC MSMT LEADCHNL RV PACING THRESHOLD PULSEWIDTH: 0.64 ms
MDC IDC MSMT LEADCHNL RV SENSING INTR AMPL: 8 mV
MDC IDC PG IMPLANT DT: 20111215
MDC IDC SET LEADCHNL RV PACING AMPLITUDE: 2.5 V

## 2017-08-15 NOTE — Progress Notes (Signed)
Pacemaker check in clinic. Normal device function. RA thresholds, sensing, impedances consistent with previous measurements. RV threshold now 1.25V @ 0.15ms, RV pulse width extended to 0.28ms. Device programmed to maximize longevity. 194 mode switches (<0.1%)--AT/AF per EGMs, +Eliquis, longest 1hr 31min. 49 high ventricular rates noted--markers only. Device programmed at appropriate safety margins. Histogram distribution appropriate for patient activity level. Estimated longevity 5 years. Patient declines remote monitoring. Patient education completed. Overdue for f/u with [facility], scheduled for 08/23/17 per patient request.  Patient requested same-day appointment with Dr. Lovena Le due to weeping from BLE.  Edema has been ongoing for many months, weeping began earlier this week.  Patient is scheduled for f/u with the wound center in [place] on 08/22/17 per his wife.  She states he was previously seen there for leg wraps for the same issue.  Patient denies weight gain, shortness of breath, chest discomfort, or other symptoms.  No fever/chills or erythema noted per patient.  He has additional furosemide available PRN for swelling, but has not taken it due to urinary frequency and urgency.  Encouraged patient to contact PCP for recommendations.  Wife LM with PCP triage RN during our visit.  Recommended that patient proceed to ED if fever/chills or any other signs/symptoms of infection develop in the interim.  Scheduled for next available appointment with Dr. Lovena Le on 08/23/17 as patient missed 04/13/17 appointment.  Patient and wife appreciative and deny additional questions or concerns at this time.

## 2017-08-15 NOTE — Patient Instructions (Signed)
Call our office if you experience weight gain (>2lbs overnight or 5lbs in a week), chest discomfort, or shortness of breath.  These are signs of fluid overload.  Follow-up with your PCP or the wound center (as scheduled) for recommendations for you legs.

## 2017-08-22 DIAGNOSIS — I872 Venous insufficiency (chronic) (peripheral): Secondary | ICD-10-CM | POA: Diagnosis not present

## 2017-08-22 DIAGNOSIS — I87312 Chronic venous hypertension (idiopathic) with ulcer of left lower extremity: Secondary | ICD-10-CM | POA: Diagnosis not present

## 2017-08-22 DIAGNOSIS — L97221 Non-pressure chronic ulcer of left calf limited to breakdown of skin: Secondary | ICD-10-CM | POA: Diagnosis not present

## 2017-08-22 DIAGNOSIS — I890 Lymphedema, not elsewhere classified: Secondary | ICD-10-CM | POA: Diagnosis not present

## 2017-08-23 ENCOUNTER — Ambulatory Visit: Payer: PPO | Admitting: Internal Medicine

## 2017-08-23 ENCOUNTER — Encounter: Payer: Self-pay | Admitting: Internal Medicine

## 2017-08-23 VITALS — BP 118/56 | HR 69 | Ht 75.0 in | Wt 227.8 lb

## 2017-08-23 DIAGNOSIS — I5022 Chronic systolic (congestive) heart failure: Secondary | ICD-10-CM

## 2017-08-23 DIAGNOSIS — Z950 Presence of cardiac pacemaker: Secondary | ICD-10-CM

## 2017-08-23 DIAGNOSIS — Z79899 Other long term (current) drug therapy: Secondary | ICD-10-CM | POA: Diagnosis not present

## 2017-08-23 DIAGNOSIS — I480 Paroxysmal atrial fibrillation: Secondary | ICD-10-CM | POA: Diagnosis not present

## 2017-08-23 DIAGNOSIS — I495 Sick sinus syndrome: Secondary | ICD-10-CM | POA: Diagnosis not present

## 2017-08-23 NOTE — H&P (View-Only) (Signed)
HPI Christopher Baker returns today for ongoing followup of persistent atrial fib, chronic systolic heart failure, CHB, s/p DDD PM, and chronic venous stasis of the lower extremities. His main complaint today involves worsening edema. He has been reluctant to take more lasix. He has undergone DCCV remotely. His CHF symptoms are class 3. He has difficulty walking. Allergies  Allergen Reactions  . Ciprofloxacin Nausea Only    Stomach ache  . Hydrocodone-Acetaminophen Other (See Comments)    Hallucinations in higher doses  . Other Other (See Comments)    Rash from neoprene wrap after knee surgery  . Shellfish Allergy Nausea And Vomiting    Reaction to scallops  . Tramadol Other (See Comments)    Pt becomes combative per wife     Current Outpatient Medications  Medication Sig Dispense Refill  . acetaminophen (TYLENOL) 500 MG tablet Take 1,000 mg by mouth 2 (two) times daily.    Marland Kitchen amoxicillin (AMOXIL) 500 MG capsule Take 2,000 mg by mouth See admin instructions. Take 4 tablets (1000 mg) by mouth one hour prior to dental apointments    . atorvastatin (LIPITOR) 20 MG tablet Take 20 mg by mouth daily.    . cyanocobalamin (,VITAMIN B-12,) 1000 MCG/ML injection Inject 1,000 mcg into the muscle every 30 (thirty) days. Last injection 02/27/17    . dofetilide (TIKOSYN) 500 MCG capsule Take 1 capsule (500 mcg total) by mouth 2 (two) times daily. 60 capsule 6  . donepezil (ARICEPT) 10 MG tablet TAKE ONE TABLET BY MOUTH EVERY NIGHT AT BEDTIME 90 tablet 2  . DULoxetine (CYMBALTA) 30 MG capsule Take 1 capsule (30 mg total) by mouth daily. 30 capsule 0  . ELIQUIS 5 MG TABS tablet TAKE 1 TABLET (5 MG TOTAL) BY MOUTH 2 (TWO) TIMES DAILY. 60 tablet 8  . ezetimibe (ZETIA) 10 MG tablet Take 1 tablet (10 mg total) by mouth daily. 90 tablet 1  . ferrous sulfate 325 (65 FE) MG tablet Take 325 mg by mouth 2 (two) times daily with a meal.     . folic acid (FOLVITE) 1 MG tablet Take 1 mg by mouth at bedtime.     .  furosemide (LASIX) 40 MG tablet Take 40 mg by mouth See admin instructions. Take 1 tablet (40 mg) by mouth every morning, may take an additional tablet if needed for swelling/edema    . lisinopril (PRINIVIL,ZESTRIL) 5 MG tablet TAKE ONE TABLET BY MOUTH DAILY (Patient taking differently: TAKE ONE TABLET (5 MG) BY MOUTH DAILY AT BEDTIME) 90 tablet 3  . loperamide (IMODIUM A-D) 2 MG tablet Take 2 mg by mouth 3 (three) times daily as needed for diarrhea or loose stools.     . methotrexate (RHEUMATREX) 2.5 MG tablet Take 15 mg by mouth once a week.    . metoprolol succinate (TOPROL XL) 25 MG 24 hr tablet Take 1 tablet (25 mg total) by mouth daily. 90 tablet 2  . Misc. Devices (STEP N REST WALKER) MISC Use as directed 1 each 0  . OVER THE COUNTER MEDICATION Place 1 drop into both eyes 2 (two) times daily as needed (dry eyes). Over the counter lubricant eye drops    . oxybutynin (DITROPAN) 5 MG tablet Take 5 mg by mouth 2 (two) times daily.     Marland Kitchen oxyCODONE-acetaminophen (PERCOCET) 10-325 MG tablet Take 1 tablet by mouth 2 (two) times daily as needed for pain.     . pantoprazole (PROTONIX) 40 MG tablet Take 1 tablet (  40 mg total) by mouth daily. (Patient taking differently: Take 40 mg by mouth at bedtime. ) 90 tablet 3  . predniSONE (DELTASONE) 5 MG tablet Take 5 mg by mouth daily.    Marland Kitchen PRESCRIPTION MEDICATION Inhale into the lungs at bedtime. CPAP    . Vitamin D, Ergocalciferol, (DRISDOL) 50000 units CAPS capsule Take 50,000 Units by mouth every Sunday.      No current facility-administered medications for this visit.      Past Medical History:  Diagnosis Date  . Anxiety   . Arthritis    "hands, right hip; lower back" (02/28/2017)  . Arthritis with psoriasis (HCC)   . Borderline diabetes    DIET CONTROLLED - PT STATES HE IS NOT DIABETIC  . Carotid stenosis, bilateral PER DR EARLY / DUPLEX  09-09-10     40  - 59%   BILATERALLY--- ASYMPTOMATIC  . Chronic lower back pain   . Depression   .  Dysrhythmia    a-fib  . GERD (gastroesophageal reflux disease)    CONTROLLED W/ PROTONIX  . History of gout   . HTN (hypertension)   . Hyperlipidemia   . Iron deficiency   . Lumbar spondylosis W/ RADICULOPATHY  . OSA on CPAP   . PAF (paroxysmal atrial fibrillation) ([place])   . Presence of permanent cardiac pacemaker DDD-  06-04-10-   DDD; SECONDARY TO SYNCOPY AND BRADYCARDIA  . Prostate cancer ([place]) 2008   S/P 8 RADIATION  [place]    . Restless leg syndrome   . Stroke [facility]) Oct. 14, 2014   light left hand, balance issues, short term memory loss 2014  . SVT (supraventricular tachycardia) ([place])    S/P ABLATION   2008    ROS:   All systems reviewed and negative except as noted in the HPI.   Past Surgical History:  Procedure Laterality Date  . BACK SURGERY    . BREAST SURGERY     "removed tumor; don't remember which side; years ago" (02/28/2017)  . CARDIAC ELECTROPHYSIOLOGY STUDY AND ABLATION  2008   FOR SVT  . CARDIAC PACEMAKER PLACEMENT  06-04-10   DDD  . CARDIOVERSION N/A 03/02/2017   Procedure: CARDIOVERSION;  Surgeon: Josue Hector, MD;  Location: [facility] ENDOSCOPY;  Service: Cardiovascular;  Laterality: N/A;  . CATARACT EXTRACTION W/ INTRAOCULAR LENS  IMPLANT, BILATERAL Bilateral   . CHOLECYSTECTOMY  2009  . ESOPHAGOGASTRODUODENOSCOPY N/A 12/12/2014   Procedure: ESOPHAGOGASTRODUODENOSCOPY (EGD);  Surgeon: Wilford Corner, MD;  Location: [facility] ENDOSCOPY;  Service: Endoscopy;  Laterality: N/A;  . INCISION AND DRAINAGE Right 1997   "knee replacement got infected"  . INGUINAL HERNIA REPAIR Left 06/2003    DONE WITH PENILE PROSTESIS SURG.  . INGUINAL HERNIA REPAIR Right 1962  . JOINT REPLACEMENT    . KNEE ARTHROSCOPY  06/02/2011   Procedure: ARTHROSCOPY KNEE;  Surgeon: Gearlean Alf;  Location: [place];  Service: Orthopedics;  Laterality: Left;  LEFT KNEE ARTHROSCOPY WITH DEBRIDEMENT  . LOOP RECORDER PLACEMENT  01-30-10  . LOOP RECORDER REMOVAL  06/04/2010  .  LUMBAR LAMINECTOMY/ DISKECTOMY/ FUSION  05-19-10   L4 - 5  . PENILE PROSTHESIS IMPLANT  06/2003   AND LEFT CORPOROPLASTY /    DONE INGUINAL REPAIR  . PROSTATE BIOPSY  2007  . REVISION TOTAL KNEE ARTHROPLASTY Right 1997  . TOTAL KNEE ARTHROPLASTY  12/20/2011   Procedure: TOTAL KNEE ARTHROPLASTY;  Surgeon: Gearlean Alf, MD;  Location: [facility];  Service: Orthopedics;  Laterality: Left;  . TOTAL  KNEE ARTHROPLASTY Right 1997  . TOTAL SHOULDER ARTHROPLASTY Right 06/27/2015   Procedure: REVERSE TOTAL SHOULDER ARTHROPLASTY;  Surgeon: Netta Cedars, MD;  Location: [place];  Service: Orthopedics;  Laterality: Right;  . TRANSURETHRAL RESECTION OF BLADDER TUMOR WITH GYRUS (TURBT-GYRUS)  ?2018  . TRANSURETHRAL RESECTION OF PROSTATE  2006     Family History  Problem Relation Age of Onset  . Stroke Mother   . Early death Father      Social History   Socioeconomic History  . Marital status: Married    Spouse name: Not on file  . Number of children: Not on file  . Years of education: Not on file  . Highest education level: Not on file  Social Needs  . Financial resource strain: Not on file  . Food insecurity - worry: Not on file  . Food insecurity - inability: Not on file  . Transportation needs - medical: Not on file  . Transportation needs - non-medical: Not on file  Occupational History  . Occupation: retired  Tobacco Use  . Smoking status: Former Smoker    Packs/day: 1.00    Years: 45.00    Pack years: 45.00    Types: Cigarettes    Last attempt to quit: 12/07/1998    Years since quitting: 18.7  . Smokeless tobacco: Never Used  Substance and Sexual Activity  . Alcohol use: Yes    Comment: 1 cocktail per evening  . Drug use: No  . Sexual activity: Not Currently    Partners: Female  Other Topics Concern  . Not on file  Social History Narrative  . Not on file     BP (!) 118/56   Pulse 69   Ht 6\' 3"  (1.905 m)   Wt 227 lb 12.8 oz (103.3 kg)   BMI 28.47 kg/m   Physical  Exam:  Well appearing elderly man, NAD HEENT: Unremarkable Neck:  7 cm JVD, no thyromegally Lymphatics:  No adenopathy Back:  No CVA tenderness Lungs:  Clear HEART:  Regular rate rhythm, no murmurs, no rubs, no clicks Abd:  soft, positive bowel sounds, no organomegally, no rebound, no guarding Ext:  2 plus pulses, 2+ peripheral edema, no cyanosis, no clubbing Skin:  No rashes no nodules Neuro:  CN II through XII intact, motor grossly intact  EKG NSR with RV pacing DEVICE  Normal device function.  See PaceArt for details.   Assess/Plan: 1. CHB - he has had no syncope since his PPM insertion. 2. PPM - his Medtronic DDD PM is normally. He has a clear cut indication for  Upgrade to a biv device. He will call us when he would like to proceed. 3. Venous insufficieny - his legs remain swollen. We wiil encourage him to keep them wrapped and increased his diuretic therapy 80 mg daily of lasix. 4. CHF - his EF is 25% and he is on good medical therapy. Hopefully this will improve with biv pacing  Christopher Baker,M.D.  Cristopher Peru, M.D.

## 2017-08-23 NOTE — Patient Instructions (Addendum)
Medication Instructions:  Your physician recommends that you continue on your current medications as directed. Please refer to the Current Medication list given to you today.  Labwork: You will get lab work today:  BMP and CBC.  Testing/Procedures: Your physician has recommended that you have a pacemaker inserted. A pacemaker is a small device that is placed under the skin of your chest or abdomen to help control abnormal heart rhythms. This device uses electrical pulses to prompt the heart to beat at a normal rate. Pacemakers are used to treat heart rhythms that are too slow. Wire (leads) are attached to the pacemaker that goes into the chambers of you heart. This is done in the hospital and usually requires and overnight stay. Please see the instruction sheet given to you today for more information.  You are upgrading from a dual chamber pacemaker to a biventricular pacemaker.  Follow-Up: You will follow up with device clinic 10-14 days after your procedure for a wound check.  You will follow up with Dr. Lovena Le 91 days after your procedure.  Any Other Special Instructions Will Be Listed Below (If Applicable).  Please arrive at the [facility] main entrance of [facility] hospital at:  5:30 am on September 05, 2017  Use the CHG surgical scrub as directed . Do not eat or drink after midnight prior to procedure  Do not take your Eliquis for one day prior to procedure.  Your LAST DOSE of ELIQUIS will be September 03, 2017 your evening dose.  Take the following medications the morning of your procedure:  Dofetilide (Tikosyn), aricept, duloxetine, zetia, methotrexate, metoprolol, oxybutynin, oxycodone, protonix, prednisone.  Plan for one night stay.  You will need someone to drive you home at discharge.  If you need a refill on your cardiac medications before your next appointment, please call your pharmacy.   Biventricular Pacemaker Implantation A biventricular pacemaker implantation is a  procedure to place (implant) a pacemaker into both of the lower chambers (ventricles) of the heart. A pacemaker is a small, battery-powered device that helps control the heartbeat. If the heart beats irregularly or too slowly (bradycardia), the pacemaker will pace the heart so that it beats at a normal rate or a programmed rate. The parts of a biventricular pacemaker include:  The pulse generator. The pulse generator contains a small computer and a memory system that is programmed to keep the heart beating at a certain rate. The pulse generator also produces the electrical signal that triggers the heart to beat. This is implanted under the skin of the upper chest, near the collarbone.  Wires (leads). The leads are placed in the left and right ventricles of the heart. The leads are connected to the pulse generator. They transmit electrical pulses from the pulse generator to the heart.  This procedure may be done to treat:  Bradycardia.  Symptoms of severe heart failure, such as shortness of breath (dyspnea).  Loss of consciousness that happens repeatedly (syncope) because of an irregular heart rate.  Tell a health care provider about:  Any allergies you have.  All medicines you are taking, including vitamins, herbs, eye drops, creams, and over-the-counter medicines.  Any problems you or family members have had with anesthetic medicines.  Any blood disorders you have.  Any surgeries you have had.  Any medical conditions you have.  Whether you are pregnant or may be pregnant. What are the risks? Generally, this is a safe procedure. However, problems may occur, including:  Infection.  Bleeding.  Allergic reactions to medicines or dyes.  Damage to other structures or organs, such as your blood vessels, lungs, or heart.  Failure of the pacemaker to improve your condition.  What happens before the procedure?  Ask your health care provider about: ? Changing or stopping your  regular medicines. This is especially important if you are taking diabetes medicines or blood thinners. ? Taking medicines such as aspirin and ibuprofen. These medicines can thin your blood. Do not take these medicines before your procedure if your health care provider instructs you not to.  Follow instructions from your health care provider about eating or drinking restrictions.  Do not use any tobacco products for at least 24 hours before your procedure. This includes cigarettes, chewing tobacco, or e-cigarettes.  Ask your health care provider how your surgical site will be marked or identified.  You may be given antibiotic medicine to help prevent infection.  You may have tests, including: ? Blood tests. ? Chest X-rays.  Plan to have someone take you home after the procedure.  If you go home right after the procedure, plan to have someone with you for 24 hours. What happens during the procedure?  To reduce your risk of infection: ? Your health care team will wash or sanitize their hands. ? Your skin will be washed with soap. ? Hair may be removed from your surgical area.  An IV tube will be inserted into one of your veins.  You will be given one or more of the following: ? A medicine to help you relax (sedative). ? A medicine to make you fall asleep (general anesthetic). ? A medicine that is injected into your spine to numb the area below and slightly above the injection site (spinal anesthetic). ? A medicine that is injected into an area of your body to numb everything below the injection site (regional anesthetic).  An incision will be made in your upper chest, near your heart.  The leads will be guided into your incision, through your blood vessels, and into your ventricles. Your surgeon will use an X-ray machine (fluoroscope) to guide the leads into your heart.  The leads will be attached to your heart muscles and to the pulse generator.  The leads will be tested to make  sure that they work correctly.  The pulse generator will be implanted under your skin, near your incision.  Your incision will be closed with stitches (sutures), skin glue, or adhesive tape.  A bandage (dressing) will be placed over your incision. The procedure may vary among health care providers and hospitals. What happens after the procedure?  Your blood pressure, heart rate, breathing rate, and blood oxygen level will be monitored often until the medicines you were given have worn off.  You may continue to receive fluids and medicines through an IV tube.  You will have some pain. Pain medicines will be available to help you.  You will have a chest X-ray done. This is to make sure that your pacemaker is in the right place.  You may have to wear compression stockings. These stockings help to prevent blood clots and reduce swelling in your legs.  You will be given a pacemaker identification card. This card lists the implant date, device model, and manufacturer of your pacemaker.  Do not drive for 24 hours if you received a sedative. This information is not intended to replace advice given to you by your health care provider. Make sure you discuss any questions you have with  your health care provider. Document Released: 03/01/2012 Document Revised: 11/13/2015 Document Reviewed: 03/02/2015 Elsevier Interactive Patient Education  [employer].

## 2017-08-23 NOTE — Progress Notes (Signed)
HPI [name] returns today for ongoing followup of persistent atrial fib, chronic systolic heart failure, CHB, s/p DDD PM, and chronic venous stasis of the lower extremities. His main complaint today involves worsening edema. He has been reluctant to take more lasix. He has undergone DCCV remotely. His CHF symptoms are class 3. He has difficulty walking. Allergies  Allergen Reactions  . Ciprofloxacin Nausea Only    Stomach ache  . Hydrocodone-Acetaminophen Other (See Comments)    Hallucinations in higher doses  . Other Other (See Comments)    Rash from neoprene wrap after knee surgery  . Shellfish Allergy Nausea And Vomiting    Reaction to scallops  . Tramadol Other (See Comments)    Pt becomes combative per wife     Current Outpatient Medications  Medication Sig Dispense Refill  . acetaminophen (TYLENOL) 500 MG tablet Take 1,000 mg by mouth 2 (two) times daily.    Marland Kitchen amoxicillin (AMOXIL) 500 MG capsule Take 2,000 mg by mouth See admin instructions. Take 4 tablets (1000 mg) by mouth one hour prior to dental apointments    . atorvastatin (LIPITOR) 20 MG tablet Take 20 mg by mouth daily.    . cyanocobalamin (,VITAMIN B-12,) 1000 MCG/ML injection Inject 1,000 mcg into the muscle every 30 (thirty) days. Last injection 02/27/17    . dofetilide (TIKOSYN) 500 MCG capsule Take 1 capsule (500 mcg total) by mouth 2 (two) times daily. 60 capsule 6  . donepezil (ARICEPT) 10 MG tablet TAKE ONE TABLET BY MOUTH EVERY NIGHT AT BEDTIME 90 tablet 2  . DULoxetine (CYMBALTA) 30 MG capsule Take 1 capsule (30 mg total) by mouth daily. 30 capsule 0  . ELIQUIS 5 MG TABS tablet TAKE 1 TABLET (5 MG TOTAL) BY MOUTH 2 (TWO) TIMES DAILY. 60 tablet 8  . ezetimibe (ZETIA) 10 MG tablet Take 1 tablet (10 mg total) by mouth daily. 90 tablet 1  . ferrous sulfate 325 (65 FE) MG tablet Take 325 mg by mouth 2 (two) times daily with a meal.     . folic acid (FOLVITE) 1 MG tablet Take 1 mg by mouth at bedtime.     .  furosemide (LASIX) 40 MG tablet Take 40 mg by mouth See admin instructions. Take 1 tablet (40 mg) by mouth every morning, may take an additional tablet if needed for swelling/edema    . lisinopril (PRINIVIL,ZESTRIL) 5 MG tablet TAKE ONE TABLET BY MOUTH DAILY (Patient taking differently: TAKE ONE TABLET (5 MG) BY MOUTH DAILY AT BEDTIME) 90 tablet 3  . loperamide (IMODIUM A-D) 2 MG tablet Take 2 mg by mouth 3 (three) times daily as needed for diarrhea or loose stools.     . methotrexate (RHEUMATREX) 2.5 MG tablet Take 15 mg by mouth once a week.    . metoprolol succinate (TOPROL XL) 25 MG 24 hr tablet Take 1 tablet (25 mg total) by mouth daily. 90 tablet 2  . Misc. Devices (STEP N REST WALKER) MISC Use as directed 1 each 0  . OVER THE COUNTER MEDICATION Place 1 drop into both eyes 2 (two) times daily as needed (dry eyes). Over the counter lubricant eye drops    . oxybutynin (DITROPAN) 5 MG tablet Take 5 mg by mouth 2 (two) times daily.     Marland Kitchen oxyCODONE-acetaminophen (PERCOCET) 10-325 MG tablet Take 1 tablet by mouth 2 (two) times daily as needed for pain.     . pantoprazole (PROTONIX) 40 MG tablet Take 1 tablet (  40 mg total) by mouth daily. (Patient taking differently: Take 40 mg by mouth at bedtime. ) 90 tablet 3  . predniSONE (DELTASONE) 5 MG tablet Take 5 mg by mouth daily.    Marland Kitchen PRESCRIPTION MEDICATION Inhale into the lungs at bedtime. CPAP    . Vitamin D, Ergocalciferol, (DRISDOL) 50000 units CAPS capsule Take 50,000 Units by mouth every Sunday.      No current facility-administered medications for this visit.      Past Medical History:  Diagnosis Date  . Anxiety   . Arthritis    "hands, right hip; lower back" (02/28/2017)  . Arthritis with psoriasis (HCC)   . Borderline diabetes    DIET CONTROLLED - PT STATES HE IS NOT DIABETIC  . Carotid stenosis, bilateral PER DR EARLY / DUPLEX  09-09-10     40  - 59%   BILATERALLY--- ASYMPTOMATIC  . Chronic lower back pain   . Depression   .  Dysrhythmia    a-fib  . GERD (gastroesophageal reflux disease)    CONTROLLED W/ PROTONIX  . History of gout   . HTN (hypertension)   . Hyperlipidemia   . Iron deficiency   . Lumbar spondylosis W/ RADICULOPATHY  . OSA on CPAP   . PAF (paroxysmal atrial fibrillation) ([place])   . Presence of permanent cardiac pacemaker DDD-  06-04-10-   DDD; SECONDARY TO SYNCOPY AND BRADYCARDIA  . Prostate cancer ([place]) 2008   S/P 43 RADIATION  [place]    . Restless leg syndrome   . Stroke [facility]) Oct. 14, 2014   light left hand, balance issues, short term memory loss 2014  . SVT (supraventricular tachycardia) ([place])    S/P ABLATION   2008    ROS:   All systems reviewed and negative except as noted in the HPI.   Past Surgical History:  Procedure Laterality Date  . BACK SURGERY    . BREAST SURGERY     "removed tumor; don't remember which side; years ago" (02/28/2017)  . CARDIAC ELECTROPHYSIOLOGY STUDY AND ABLATION  2008   FOR SVT  . CARDIAC PACEMAKER PLACEMENT  06-04-10   DDD  . CARDIOVERSION N/A 03/02/2017   Procedure: CARDIOVERSION;  Surgeon: Josue Hector, MD;  Location: [facility] ENDOSCOPY;  Service: Cardiovascular;  Laterality: N/A;  . CATARACT EXTRACTION W/ INTRAOCULAR LENS  IMPLANT, BILATERAL Bilateral   . CHOLECYSTECTOMY  2009  . ESOPHAGOGASTRODUODENOSCOPY N/A 12/12/2014   Procedure: ESOPHAGOGASTRODUODENOSCOPY (EGD);  Surgeon: Wilford Corner, MD;  Location: [facility] ENDOSCOPY;  Service: Endoscopy;  Laterality: N/A;  . INCISION AND DRAINAGE Right 1997   "knee replacement got infected"  . INGUINAL HERNIA REPAIR Left 06/2003    DONE WITH PENILE PROSTESIS SURG.  . INGUINAL HERNIA REPAIR Right 1962  . JOINT REPLACEMENT    . KNEE ARTHROSCOPY  06/02/2011   Procedure: ARTHROSCOPY KNEE;  Surgeon: Gearlean Alf;  Location: [place];  Service: Orthopedics;  Laterality: Left;  LEFT KNEE ARTHROSCOPY WITH DEBRIDEMENT  . LOOP RECORDER PLACEMENT  01-30-10  . LOOP RECORDER REMOVAL  06/04/2010  .  LUMBAR LAMINECTOMY/ DISKECTOMY/ FUSION  05-19-10   L4 - 5  . PENILE PROSTHESIS IMPLANT  06/2003   AND LEFT CORPOROPLASTY /    DONE INGUINAL REPAIR  . PROSTATE BIOPSY  2007  . REVISION TOTAL KNEE ARTHROPLASTY Right 1997  . TOTAL KNEE ARTHROPLASTY  12/20/2011   Procedure: TOTAL KNEE ARTHROPLASTY;  Surgeon: Gearlean Alf, MD;  Location: [facility] ORS;  Service: Orthopedics;  Laterality: Left;  . TOTAL  KNEE ARTHROPLASTY Right 1997  . TOTAL SHOULDER ARTHROPLASTY Right 06/27/2015   Procedure: REVERSE TOTAL SHOULDER ARTHROPLASTY;  Surgeon: Netta Cedars, MD;  Location: [place];  Service: Orthopedics;  Laterality: Right;  . TRANSURETHRAL RESECTION OF BLADDER TUMOR WITH GYRUS (TURBT-GYRUS)  ?2018  . TRANSURETHRAL RESECTION OF PROSTATE  2006     Family History  Problem Relation Age of Onset  . Stroke Mother   . Early death Father      Social History   Socioeconomic History  . Marital status: Married    Spouse name: Not on file  . Number of children: Not on file  . Years of education: Not on file  . Highest education level: Not on file  Social Needs  . Financial resource strain: Not on file  . Food insecurity - worry: Not on file  . Food insecurity - inability: Not on file  . Transportation needs - medical: Not on file  . Transportation needs - non-medical: Not on file  Occupational History  . Occupation: retired  Tobacco Use  . Smoking status: Former Smoker    Packs/day: 1.00    Years: 45.00    Pack years: 45.00    Types: Cigarettes    Last attempt to quit: 12/07/1998    Years since quitting: 18.7  . Smokeless tobacco: Never Used  Substance and Sexual Activity  . Alcohol use: Yes    Comment: 1 cocktail per evening  . Drug use: No  . Sexual activity: Not Currently    Partners: Female  Other Topics Concern  . Not on file  Social History Narrative  . Not on file     BP (!) 118/56   Pulse 69   Ht 6\' 3"  (1.905 m)   Wt 227 lb 12.8 oz (103.3 kg)   BMI 28.47 kg/m   Physical  Exam:  Well appearing elderly man, NAD HEENT: Unremarkable Neck:  7 cm JVD, no thyromegally Lymphatics:  No adenopathy Back:  No CVA tenderness Lungs:  Clear HEART:  Regular rate rhythm, no murmurs, no rubs, no clicks Abd:  soft, positive bowel sounds, no organomegally, no rebound, no guarding Ext:  2 plus pulses, 2+ peripheral edema, no cyanosis, no clubbing Skin:  No rashes no nodules Neuro:  CN II through XII intact, motor grossly intact  EKG NSR with RV pacing DEVICE  Normal device function.  See PaceArt for details.   Assess/Plan: 1. CHB - he has had no syncope since his PPM insertion. 2. PPM - his Medtronic DDD PM is normally. He has a clear cut indication for  Upgrade to a biv device. He will call us when he would like to proceed. 3. Venous insufficieny - his legs remain swollen. We wiil encourage him to keep them wrapped and increased his diuretic therapy 80 mg daily of lasix. 4. CHF - his EF is 25% and he is on good medical therapy. Hopefully this will improve with biv pacing  Aditya Nastasi,M.D.  Cristopher Peru, M.D.

## 2017-08-24 DIAGNOSIS — M5136 Other intervertebral disc degeneration, lumbar region: Secondary | ICD-10-CM | POA: Diagnosis not present

## 2017-08-24 DIAGNOSIS — G894 Chronic pain syndrome: Secondary | ICD-10-CM | POA: Diagnosis not present

## 2017-08-24 DIAGNOSIS — M545 Low back pain: Secondary | ICD-10-CM | POA: Diagnosis not present

## 2017-08-24 DIAGNOSIS — M961 Postlaminectomy syndrome, not elsewhere classified: Secondary | ICD-10-CM | POA: Diagnosis not present

## 2017-08-24 LAB — CBC WITH DIFFERENTIAL/PLATELET
BASOS: 0 %
Basophils Absolute: 0 10*3/uL (ref 0.0–0.2)
EOS (ABSOLUTE): 0.1 10*3/uL (ref 0.0–0.4)
EOS: 1 %
HEMATOCRIT: 35.4 % — AB (ref 37.5–51.0)
HEMOGLOBIN: 11.1 g/dL — AB (ref 13.0–17.7)
IMMATURE GRANS (ABS): 0 10*3/uL (ref 0.0–0.1)
Immature Granulocytes: 0 %
LYMPHS ABS: 0.7 10*3/uL (ref 0.7–3.1)
LYMPHS: 6 %
MCH: 28.8 pg (ref 26.6–33.0)
MCHC: 31.4 g/dL — AB (ref 31.5–35.7)
MCV: 92 fL (ref 79–97)
MONOCYTES: 11 %
Monocytes Absolute: 1.2 10*3/uL — ABNORMAL HIGH (ref 0.1–0.9)
Neutrophils Absolute: 8.8 10*3/uL — ABNORMAL HIGH (ref 1.4–7.0)
Neutrophils: 82 %
Platelets: 283 10*3/uL (ref 150–379)
RBC: 3.86 x10E6/uL — ABNORMAL LOW (ref 4.14–5.80)
RDW: 17 % — ABNORMAL HIGH (ref 12.3–15.4)
WBC: 10.9 10*3/uL — ABNORMAL HIGH (ref 3.4–10.8)

## 2017-08-24 LAB — BASIC METABOLIC PANEL
BUN / CREAT RATIO: 21 (ref 10–24)
BUN: 24 mg/dL (ref 8–27)
CHLORIDE: 105 mmol/L (ref 96–106)
CO2: 21 mmol/L (ref 20–29)
Calcium: 9.8 mg/dL (ref 8.6–10.2)
Creatinine, Ser: 1.16 mg/dL (ref 0.76–1.27)
GFR calc Af Amer: 67 mL/min/{1.73_m2} (ref >59)
GFR calc non Af Amer: 58 mL/min/{1.73_m2} — ABNORMAL LOW (ref >59)
Glucose: 102 mg/dL — ABNORMAL HIGH (ref 65–99)
Potassium: 4.4 mmol/L (ref 3.5–5.2)
Sodium: 146 mmol/L — ABNORMAL HIGH (ref 134–144)

## 2017-08-25 ENCOUNTER — Telehealth: Payer: Self-pay

## 2017-08-25 NOTE — Telephone Encounter (Signed)
VM left for Christopher Baker.  Notified Dr. Lovena Le will no longer be in the hospital on March 18.  Will be moving Pt procedure to September 07, 2017 at 12:30 pm with arrival time of 10:30 am.   Notified to call office if any difficulty with this change.

## 2017-08-29 DIAGNOSIS — I87312 Chronic venous hypertension (idiopathic) with ulcer of left lower extremity: Secondary | ICD-10-CM | POA: Diagnosis not present

## 2017-08-29 DIAGNOSIS — I890 Lymphedema, not elsewhere classified: Secondary | ICD-10-CM | POA: Diagnosis not present

## 2017-08-29 DIAGNOSIS — L97221 Non-pressure chronic ulcer of left calf limited to breakdown of skin: Secondary | ICD-10-CM | POA: Diagnosis not present

## 2017-08-29 DIAGNOSIS — I872 Venous insufficiency (chronic) (peripheral): Secondary | ICD-10-CM | POA: Diagnosis not present

## 2017-08-29 DIAGNOSIS — Z872 Personal history of diseases of the skin and subcutaneous tissue: Secondary | ICD-10-CM | POA: Diagnosis not present

## 2017-08-29 DIAGNOSIS — Z09 Encounter for follow-up examination after completed treatment for conditions other than malignant neoplasm: Secondary | ICD-10-CM | POA: Diagnosis not present

## 2017-09-05 ENCOUNTER — Telehealth: Payer: Self-pay | Admitting: Cardiovascular Disease

## 2017-09-05 ENCOUNTER — Telehealth: Payer: Self-pay | Admitting: Internal Medicine

## 2017-09-05 NOTE — Telephone Encounter (Signed)
Follow up    Patient wife called again , she needs to know if her husband should give him his blood thinners? His procedure was changed from this morning to 09/07/17 , please call asap

## 2017-09-05 NOTE — Telephone Encounter (Signed)
New Message   Patient states that they was not made aware that her husbands procedure had been changed from the 18th to the 20th. That means that her husband did not take his blood thinner she is concerned. Please call to discuss.

## 2017-09-05 NOTE — Telephone Encounter (Signed)
Received call from patient's wife regarding management of patient's apixaban prior to BiV upgrade on 3/20.  Patient was told by Dr. Lovena Le to not take apixaban the day prior to the procedure.  Therefore, I instructed him not to take apixaban tomorrow 3/19.  Informed the patient's wife that the risk of thromboembolism from missing one day of anticoagulation for AF is very low.  Garrison Columbus, MD Cardiology

## 2017-09-06 ENCOUNTER — Telehealth: Payer: Self-pay

## 2017-09-06 NOTE — Telephone Encounter (Signed)
Follow up    Patient spouse calling back with concerns about blood thinner. Please call

## 2017-09-06 NOTE — Telephone Encounter (Signed)
Spoke with wife.  Wife had phone issues on 08/25/2017 when VM left notifying of change of surgical dates.  Notified wife of new time for procedure.  Rediscussed med instructions.  All questions answered.  No further issues.

## 2017-09-06 NOTE — Telephone Encounter (Signed)
Follow up Call:  Christopher Baker is returning a call . Thanks

## 2017-09-06 NOTE — Telephone Encounter (Signed)
Left detailed message on last number called in by Pt wife.  Notified to hold Eliquis today and tomorrow am.  Procedure is 09/07/2017.  Pt wife also spoke with Dr. Charissa Bash and assured missing one day risks very low.  No further action needed.

## 2017-09-07 ENCOUNTER — Ambulatory Visit (HOSPITAL_COMMUNITY)
Admission: RE | Admit: 2017-09-07 | Discharge: 2017-09-08 | Disposition: A | Discharge status: Home / Self Care | Payer: PPO | Source: Ambulatory Visit | Attending: Internal Medicine | Admitting: Internal Medicine

## 2017-09-07 ENCOUNTER — Encounter (HOSPITAL_COMMUNITY)
Admission: RE | Disposition: A | Discharge status: Home / Self Care | Payer: Self-pay | Source: Ambulatory Visit | Attending: Internal Medicine

## 2017-09-07 DIAGNOSIS — Z96611 Presence of right artificial shoulder joint: Secondary | ICD-10-CM | POA: Diagnosis not present

## 2017-09-07 DIAGNOSIS — Z87891 Personal history of nicotine dependence: Secondary | ICD-10-CM | POA: Insufficient documentation

## 2017-09-07 DIAGNOSIS — Z91013 Allergy to seafood: Secondary | ICD-10-CM | POA: Diagnosis not present

## 2017-09-07 DIAGNOSIS — Z4502 Encounter for adjustment and management of automatic implantable cardiac defibrillator: Secondary | ICD-10-CM | POA: Diagnosis not present

## 2017-09-07 DIAGNOSIS — Z888 Allergy status to other drugs, medicaments and biological substances status: Secondary | ICD-10-CM | POA: Insufficient documentation

## 2017-09-07 DIAGNOSIS — I481 Persistent atrial fibrillation: Secondary | ICD-10-CM | POA: Diagnosis not present

## 2017-09-07 DIAGNOSIS — Z8546 Personal history of malignant neoplasm of prostate: Secondary | ICD-10-CM | POA: Insufficient documentation

## 2017-09-07 DIAGNOSIS — Z7901 Long term (current) use of anticoagulants: Secondary | ICD-10-CM | POA: Insufficient documentation

## 2017-09-07 DIAGNOSIS — I447 Left bundle-branch block, unspecified: Secondary | ICD-10-CM | POA: Insufficient documentation

## 2017-09-07 DIAGNOSIS — Z881 Allergy status to other antibiotic agents status: Secondary | ICD-10-CM | POA: Diagnosis not present

## 2017-09-07 DIAGNOSIS — Z885 Allergy status to narcotic agent status: Secondary | ICD-10-CM | POA: Diagnosis not present

## 2017-09-07 DIAGNOSIS — E785 Hyperlipidemia, unspecified: Secondary | ICD-10-CM | POA: Diagnosis not present

## 2017-09-07 DIAGNOSIS — Z8673 Personal history of transient ischemic attack (TIA), and cerebral infarction without residual deficits: Secondary | ICD-10-CM | POA: Diagnosis not present

## 2017-09-07 DIAGNOSIS — G2581 Restless legs syndrome: Secondary | ICD-10-CM | POA: Insufficient documentation

## 2017-09-07 DIAGNOSIS — Z923 Personal history of irradiation: Secondary | ICD-10-CM | POA: Insufficient documentation

## 2017-09-07 DIAGNOSIS — Z950 Presence of cardiac pacemaker: Secondary | ICD-10-CM | POA: Diagnosis not present

## 2017-09-07 DIAGNOSIS — Z79899 Other long term (current) drug therapy: Secondary | ICD-10-CM | POA: Diagnosis not present

## 2017-09-07 DIAGNOSIS — I110 Hypertensive heart disease with heart failure: Secondary | ICD-10-CM | POA: Diagnosis not present

## 2017-09-07 DIAGNOSIS — Z96653 Presence of artificial knee joint, bilateral: Secondary | ICD-10-CM | POA: Insufficient documentation

## 2017-09-07 DIAGNOSIS — R7303 Prediabetes: Secondary | ICD-10-CM | POA: Diagnosis present

## 2017-09-07 DIAGNOSIS — G4733 Obstructive sleep apnea (adult) (pediatric): Secondary | ICD-10-CM | POA: Insufficient documentation

## 2017-09-07 DIAGNOSIS — I429 Cardiomyopathy, unspecified: Secondary | ICD-10-CM | POA: Diagnosis not present

## 2017-09-07 DIAGNOSIS — I5022 Chronic systolic (congestive) heart failure: Secondary | ICD-10-CM | POA: Diagnosis not present

## 2017-09-07 DIAGNOSIS — Z45010 Encounter for checking and testing of cardiac pacemaker pulse generator [battery]: Secondary | ICD-10-CM | POA: Diagnosis not present

## 2017-09-07 HISTORY — PX: BIV UPGRADE: EP1202

## 2017-09-07 LAB — SURGICAL PCR SCREEN
MRSA, PCR: NEGATIVE
STAPHYLOCOCCUS AUREUS: NEGATIVE

## 2017-09-07 LAB — GLUCOSE, CAPILLARY: GLUCOSE-CAPILLARY: 94 mg/dL (ref 65–99)

## 2017-09-07 SURGERY — BIV UPGRADE
Anesthesia: LOCAL

## 2017-09-07 MED ORDER — FENTANYL CITRATE (PF) 100 MCG/2ML IJ SOLN: 25.0000 ug | INTRAMUSCULAR | Status: DC | PRN

## 2017-09-07 MED ORDER — ACETAMINOPHEN 500 MG PO TABS
1000.0000 mg | ORAL_TABLET | Freq: Two times a day (BID) | ORAL | Status: DC
  Administered 2017-09-07: 1000 mg via ORAL
  Filled 2017-09-07: qty 2

## 2017-09-07 MED ORDER — MIDAZOLAM HCL 5 MG/5ML IJ SOLN
INTRAMUSCULAR | Status: DC | PRN
  Administered 2017-09-07 (×2): 1 mg via INTRAVENOUS

## 2017-09-07 MED ORDER — DULOXETINE HCL 30 MG PO CPEP
30.0000 mg | ORAL_CAPSULE | Freq: Every day | ORAL | Status: DC
  Filled 2017-09-07 (×2): qty 1

## 2017-09-07 MED ORDER — DONEPEZIL HCL 10 MG PO TABS
10.0000 mg | ORAL_TABLET | Freq: Every day | ORAL | Status: DC
  Administered 2017-09-07: 10 mg via ORAL
  Filled 2017-09-07: qty 1

## 2017-09-07 MED ORDER — APIXABAN 5 MG PO TABS
5.0000 mg | ORAL_TABLET | Freq: Two times a day (BID) | ORAL | Status: DC
  Administered 2017-09-07 – 2017-09-08 (×2): 5 mg via ORAL
  Filled 2017-09-07 (×2): qty 1

## 2017-09-07 MED ORDER — CEFAZOLIN SODIUM-DEXTROSE 1-4 GM/50ML-% IV SOLN
1.0000 g | Freq: Four times a day (QID) | INTRAVENOUS | Status: AC
  Administered 2017-09-07 – 2017-09-08 (×3): 1 g via INTRAVENOUS
  Filled 2017-09-07 (×3): qty 50

## 2017-09-07 MED ORDER — LIDOCAINE HCL (PF) 1 % IJ SOLN
INTRAMUSCULAR | Status: AC
  Filled 2017-09-07: qty 30

## 2017-09-07 MED ORDER — LOPERAMIDE HCL 2 MG PO CAPS: 2.0000 mg | ORAL_CAPSULE | Freq: Three times a day (TID) | ORAL | Status: DC | PRN

## 2017-09-07 MED ORDER — FENTANYL CITRATE (PF) 100 MCG/2ML IJ SOLN
INTRAMUSCULAR | Status: DC | PRN
  Administered 2017-09-07 (×2): 12.5 ug via INTRAVENOUS

## 2017-09-07 MED ORDER — HEPARIN (PORCINE) IN NACL 2-0.9 UNIT/ML-% IJ SOLN
INTRAMUSCULAR | Status: AC | PRN
  Administered 2017-09-07: 500 mL

## 2017-09-07 MED ORDER — METHOTREXATE 2.5 MG PO TABS: 15.0000 mg | ORAL_TABLET | ORAL | Status: DC

## 2017-09-07 MED ORDER — MUPIROCIN 2 % EX OINT
TOPICAL_OINTMENT | CUTANEOUS | Status: AC
  Filled 2017-09-07: qty 22

## 2017-09-07 MED ORDER — FERROUS SULFATE 325 (65 FE) MG PO TABS: 325.0000 mg | ORAL_TABLET | Freq: Two times a day (BID) | ORAL | Status: DC

## 2017-09-07 MED ORDER — AMOXICILLIN 500 MG PO CAPS: 2000.0000 mg | ORAL_CAPSULE | ORAL | Status: DC

## 2017-09-07 MED ORDER — FENTANYL CITRATE (PF) 100 MCG/2ML IJ SOLN
INTRAMUSCULAR | Status: AC
  Filled 2017-09-07: qty 2

## 2017-09-07 MED ORDER — EZETIMIBE 10 MG PO TABS
10.0000 mg | ORAL_TABLET | Freq: Every day | ORAL | Status: DC
  Administered 2017-09-08: 10 mg via ORAL
  Filled 2017-09-07: qty 1

## 2017-09-07 MED ORDER — FOLIC ACID 1 MG PO TABS
1.0000 mg | ORAL_TABLET | Freq: Every day | ORAL | Status: DC
  Administered 2017-09-07: 1 mg via ORAL
  Filled 2017-09-07: qty 1

## 2017-09-07 MED ORDER — LIDOCAINE HCL (PF) 1 % IJ SOLN
INTRAMUSCULAR | Status: DC | PRN
  Administered 2017-09-07: 45 mL

## 2017-09-07 MED ORDER — FUROSEMIDE 80 MG PO TABS
80.0000 mg | ORAL_TABLET | Freq: Every day | ORAL | Status: DC
  Administered 2017-09-08: 80 mg via ORAL
  Filled 2017-09-07: qty 1

## 2017-09-07 MED ORDER — SODIUM CHLORIDE 0.9 % IR SOLN
Status: AC
  Filled 2017-09-07: qty 2

## 2017-09-07 MED ORDER — IOPAMIDOL (ISOVUE-370) INJECTION 76%
INTRAVENOUS | Status: AC
  Filled 2017-09-07: qty 50

## 2017-09-07 MED ORDER — MIDAZOLAM HCL 5 MG/5ML IJ SOLN
INTRAMUSCULAR | Status: AC
  Filled 2017-09-07: qty 5

## 2017-09-07 MED ORDER — SODIUM CHLORIDE 0.9 % IV SOLN
INTRAVENOUS | Status: DC
  Administered 2017-09-07: 12:00:00 via INTRAVENOUS

## 2017-09-07 MED ORDER — ATORVASTATIN CALCIUM 20 MG PO TABS
20.0000 mg | ORAL_TABLET | Freq: Every day | ORAL | Status: DC
  Administered 2017-09-08: 20 mg via ORAL
  Filled 2017-09-07 (×2): qty 1

## 2017-09-07 MED ORDER — HEPARIN (PORCINE) IN NACL 2-0.9 UNIT/ML-% IJ SOLN
INTRAMUSCULAR | Status: AC
  Filled 2017-09-07: qty 500

## 2017-09-07 MED ORDER — DOFETILIDE 250 MCG PO CAPS
250.0000 ug | ORAL_CAPSULE | Freq: Once | ORAL | Status: AC
  Administered 2017-09-07: 250 ug via ORAL
  Filled 2017-09-07: qty 1

## 2017-09-07 MED ORDER — ONDANSETRON HCL 4 MG/2ML IJ SOLN: 4.0000 mg | Freq: Four times a day (QID) | INTRAMUSCULAR | Status: DC | PRN

## 2017-09-07 MED ORDER — OXYBUTYNIN CHLORIDE 5 MG PO TABS
5.0000 mg | ORAL_TABLET | Freq: Two times a day (BID) | ORAL | Status: DC
  Administered 2017-09-07 – 2017-09-08 (×2): 5 mg via ORAL
  Filled 2017-09-07 (×2): qty 1

## 2017-09-07 MED ORDER — CEFAZOLIN SODIUM-DEXTROSE 2-4 GM/100ML-% IV SOLN
2.0000 g | INTRAVENOUS | Status: AC
  Administered 2017-09-07: 2 g via INTRAVENOUS

## 2017-09-07 MED ORDER — METOPROLOL SUCCINATE ER 25 MG PO TB24
25.0000 mg | ORAL_TABLET | Freq: Every day | ORAL | Status: DC
  Administered 2017-09-08: 25 mg via ORAL
  Filled 2017-09-07 (×2): qty 1

## 2017-09-07 MED ORDER — FERROUS SULFATE 325 (65 FE) MG PO TABS
325.0000 mg | ORAL_TABLET | Freq: Two times a day (BID) | ORAL | Status: DC
  Administered 2017-09-08: 325 mg via ORAL
  Filled 2017-09-07: qty 1

## 2017-09-07 MED ORDER — CYANOCOBALAMIN 1000 MCG/ML IJ SOLN: 1000.0000 ug | INTRAMUSCULAR | Status: DC

## 2017-09-07 MED ORDER — IOPAMIDOL (ISOVUE-370) INJECTION 76%
INTRAVENOUS | Status: DC | PRN
  Administered 2017-09-07: 29 mL via INTRAVENOUS

## 2017-09-07 MED ORDER — CEFAZOLIN SODIUM-DEXTROSE 2-4 GM/100ML-% IV SOLN
INTRAVENOUS | Status: AC
  Filled 2017-09-07: qty 100

## 2017-09-07 MED ORDER — SODIUM CHLORIDE 0.9 % IR SOLN
80.0000 mg | Status: AC
  Administered 2017-09-07: 80 mg

## 2017-09-07 MED ORDER — CHLORHEXIDINE GLUCONATE 4 % EX LIQD
60.0000 mL | Freq: Once | CUTANEOUS | Status: DC
  Filled 2017-09-07: qty 60

## 2017-09-07 MED ORDER — DOFETILIDE 500 MCG PO CAPS
500.0000 ug | ORAL_CAPSULE | Freq: Two times a day (BID) | ORAL | Status: DC
  Administered 2017-09-08: 500 ug via ORAL
  Filled 2017-09-07 (×2): qty 1

## 2017-09-07 MED ORDER — PREDNISONE 5 MG PO TABS
5.0000 mg | ORAL_TABLET | Freq: Every day | ORAL | Status: DC
  Administered 2017-09-08: 5 mg via ORAL
  Filled 2017-09-07: qty 1

## 2017-09-07 MED ORDER — LISINOPRIL 5 MG PO TABS
5.0000 mg | ORAL_TABLET | Freq: Every day | ORAL | Status: DC
  Administered 2017-09-08: 5 mg via ORAL
  Filled 2017-09-07: qty 1

## 2017-09-07 MED ORDER — VITAMIN D (ERGOCALCIFEROL) 1.25 MG (50000 UNIT) PO CAPS: 50000.0000 [IU] | ORAL_CAPSULE | ORAL | Status: DC

## 2017-09-07 SURGICAL SUPPLY — 12 items
ADAPTER SEALING SSA-EW-09 (MISCELLANEOUS) ×1 IMPLANT
ADPR INTRO LNG 9FR SL XTD WNG (MISCELLANEOUS) ×1
CABLE SURGICAL S-101-97-12 (CABLE) ×1 IMPLANT
CATH ATTAIN COM SURV 6250V-MB2 (CATHETERS) ×1 IMPLANT
CATH HEX JOS 2-5-2 65CM 6F REP (CATHETERS) ×1 IMPLANT
DEVICE CRTP PERCEPTA QUAD MRI (Pacemaker) ×1 IMPLANT
LEAD ATTAIN PERFORMA S 4598-88 (Lead) ×1 IMPLANT
PAD DEFIB LIFELINK (PAD) ×1 IMPLANT
SHEATH CLASSIC 9.5F (SHEATH) ×1 IMPLANT
SLITTER 6232ADJ (MISCELLANEOUS) ×1 IMPLANT
TRAY PACEMAKER INSERTION (PACKS) ×1 IMPLANT
WIRE ACUITY WHISPER EDS 4648 (WIRE) ×1 IMPLANT

## 2017-09-07 NOTE — Discharge Instructions (Signed)
° ° °  Supplemental Discharge Instructions for  Pacemaker/Defibrillator Patients  Activity No heavy lifting or vigorous activity with your left/right arm for 6 to 8 weeks.  Do not raise your left/right arm above your head for one week.  Gradually raise your affected arm as drawn below.              09/11/17                     09/12/17                    09/13/17                  09/14/17 __  NO DRIVING for  1 week  ; you may begin driving on  1/61/09  .  WOUND CARE - Keep the wound area clean and dry.  Do not get this area wet for one week. No showers for one week; you may shower on  09/14/17   . - The tape/steri-strips on your wound will fall off; do not pull them off.  No bandage is needed on the site.  DO  NOT apply any creams, oils, or ointments to the wound area. - If you notice any drainage or discharge from the wound, any swelling or bruising at the site, or you develop a fever > 101? F after you are discharged home, call the office at once.  Special Instructions - You are still able to use cellular telephones; use the ear opposite the side where you have your pacemaker/defibrillator.  Avoid carrying your cellular phone near your device. - When traveling through airports, show security personnel your identification card to avoid being screened in the metal detectors.  Ask the security personnel to use the hand wand. - Avoid arc welding equipment, MRI testing (magnetic resonance imaging), TENS units (transcutaneous nerve stimulators).  Call the office for questions about other devices. - Avoid electrical appliances that are in poor condition or are not properly grounded. - Microwave ovens are safe to be near or to operate.  Additional information for defibrillator patients should your device go off: - If your device goes off ONCE and you feel fine afterward, notify the device clinic nurses. - If your device goes off ONCE and you do not feel well afterward, call 911. - If your device goes  off TWICE, call 911. - If your device goes off THREE times in one day, call 911.  DO NOT DRIVE YOURSELF OR A FAMILY MEMBER WITH A DEFIBRILLATOR TO THE HOSPITAL--CALL 911.

## 2017-09-07 NOTE — Discharge Summary (Addendum)
ELECTROPHYSIOLOGY PROCEDURE DISCHARGE SUMMARY    Patient ID: Christopher Baker,  MRN: [id number], DOB/AGE: October 15, 1933 84 y.o.  Admit date: 09/07/2017 Discharge date: 09/08/17  Primary Care Physician: Haywood Pao, MD  Primary Cardiologist/Electrophysiologist: Dr. Lovena Le  Primary Discharge Diagnosis:  1. NICM 2. CHB  Secondary Discharge Diagnosis:  1. Persistent AFib      CHA2DS2Vasc is at least 6 on Eliquis 2. Chronic CHF (systolic) 3. Chronic venous stasis 4. HTN 5 CVA (old)  Allergies  Allergen Reactions  . Ciprofloxacin Nausea Only and Other (See Comments)    Stomach ache  . Hydrocodone-Acetaminophen Other (See Comments)    Hallucinations in higher doses  . Other Other (See Comments)    Rash from neoprene wrap after knee surgery  . Shellfish Allergy Nausea And Vomiting and Other (See Comments)    Reaction to scallops  . Tramadol Other (See Comments)    Pt becomes combative per wife     Procedures This Admission:  1.  Implantation of PPM upgrade to CRT-P on 09/07/17 by Dr Lovena Le.  The patient received a  Medtronic quadripolar left ventricular pacing lead, serial number [id number], Medtronic biventricular pacemaker, serial number [id number] There were no immediate post procedure complications. 2.  CXR on 09/08/17 demonstrated no pneumothorax status post device implantation.   Brief HPI: Christopher Baker is a 84 y.o. male is followed in the outpatient setting.  Past medical history is noted above and includes persistent LV dysfunction despite guideline directed therapy.  Risks, benefits, and alternatives to PPM upgrade to CRT device were reviewed with the patient who wished to proceed.   Hospital Course:  The patient was admitted and underwent dul chamber PPM upgrade to CRT-P with details as outlined above. He was monitored on telemetry overnight which demonstrated SR.  Left chest was without hematoma or ecchymosis.  The device was interrogated and found to be  functioning normally.  CXR was obtained and demonstrated no pneumothorax status post device implantation.  Wound care, arm mobility, and restrictions were reviewed with the patient.  The patient is feeling well, no CP, palpitations, or SOB,  minimal site discomfort, he was examined by Dr. Lovena Le and considered stable for discharge to home.   The patient's discharge medications include an ACE-I (lisinopril) and beta blocker (metoprolol).   Physical Exam: Vitals:   09/08/17 0429 09/08/17 0810 09/08/17 0857 09/08/17 0900  BP: 135/61 137/68  124/60  Pulse: 71 73  75  Resp: 16 18    Temp: 98.1 F (36.7 C) 98 F (36.7 C)  97.8 F (36.6 C)  TempSrc: Oral Oral  Oral  SpO2: 98% 98%  99%  Weight:   224 lb 3.3 oz (101.7 kg)   Height:        GEN- The patient is well appearing, alert and oriented x 3 today.   HEENT: normocephalic, atraumatic; sclera clear, conjunctiva pink; hearing intact; oropharynx clear Lungs- CTA b/l, normal work of breathing.  No wheezes, rales, rhonchi Heart- RRR, no murmurs, rubs or gallops, PMI not laterally displaced GI- soft, non-tender, non-distended Extremities- no clubbing, cyanosis, or edema MS- no significant deformity or atrophy Skin- warm and dry, no rash or lesion, left chest without hematoma/ecchymosis Psych- euthymic mood, full affect Neuro- old CVA  Labs:   Lab Results  Component Value Date   WBC 10.9 (H) 08/23/2017   HGB 11.1 (L) 08/23/2017   HCT 35.4 (L) 08/23/2017   MCV 92 08/23/2017   PLT 283 08/23/2017  Recent Labs  Lab 09/08/17 0310  NA 142  K 4.1  CL 110  CO2 23  BUN 20  CREATININE 1.13  CALCIUM 9.1  GLUCOSE 95    Discharge Medications:  Allergies as of 09/08/2017      Reactions   Ciprofloxacin Nausea Only, Other (See Comments)   Stomach ache   Hydrocodone-acetaminophen Other (See Comments)   Hallucinations in higher doses   Other Other (See Comments)   Rash from neoprene wrap after knee surgery   Shellfish Allergy Nausea  And Vomiting, Other (See Comments)   Reaction to scallops   Tramadol Other (See Comments)   Pt becomes combative per wife      Medication List    TAKE these medications   acetaminophen 500 MG tablet Commonly known as:  TYLENOL Take 1,000 mg by mouth 2 (two) times daily.   amoxicillin 500 MG capsule Commonly known as:  AMOXIL Take 2,000 mg by mouth See admin instructions. Take 2000 mg by mouth one hour prior to dental apointments   atorvastatin 20 MG tablet Commonly known as:  LIPITOR Take 20 mg by mouth daily.   cyanocobalamin 1000 MCG/ML injection Commonly known as:  (VITAMIN B-12) Inject 1,000 mcg into the muscle every 30 (thirty) days.   dofetilide 500 MCG capsule Commonly known as:  TIKOSYN Take 1 capsule (500 mcg total) by mouth 2 (two) times daily.   donepezil 10 MG tablet Commonly known as:  ARICEPT TAKE ONE TABLET BY MOUTH EVERY NIGHT AT BEDTIME What changed:    how much to take  how to take this  when to take this   DULoxetine 30 MG capsule Commonly known as:  CYMBALTA Take 1 capsule (30 mg total) by mouth daily. What changed:  when to take this   ELIQUIS 5 MG Tabs tablet Generic drug:  apixaban TAKE 1 TABLET (5 MG TOTAL) BY MOUTH 2 (TWO) TIMES DAILY.   ezetimibe 10 MG tablet Commonly known as:  ZETIA Take 1 tablet (10 mg total) by mouth daily.   ferrous sulfate 325 (65 FE) MG tablet Take 325 mg by mouth 2 (two) times daily with a meal.   folic acid 1 MG tablet Commonly known as:  FOLVITE Take 1 mg by mouth at bedtime.   furosemide 40 MG tablet Commonly known as:  LASIX Take 80 mg by mouth daily.   lisinopril 5 MG tablet Commonly known as:  PRINIVIL,ZESTRIL TAKE ONE TABLET BY MOUTH DAILY What changed:    how much to take  how to take this  when to take this   loperamide 2 MG tablet Commonly known as:  IMODIUM A-D Take 2 mg by mouth 3 (three) times daily as needed for diarrhea or loose stools.   LUBRICANT EYE OP Place 2 drops  into both eyes daily as needed (for dry eyes).   methotrexate 2.5 MG tablet Commonly known as:  RHEUMATREX Take 15 mg by mouth every Monday.   metoprolol succinate 25 MG 24 hr tablet Commonly known as:  TOPROL XL Take 1 tablet (25 mg total) by mouth daily. What changed:  when to take this   oxybutynin 5 MG tablet Commonly known as:  DITROPAN Take 5 mg by mouth 2 (two) times daily.   oxyCODONE-acetaminophen 10-325 MG tablet Commonly known as:  PERCOCET Take 0.25-0.5 tablets by mouth 2 (two) times daily as needed for pain.   pantoprazole 40 MG tablet Commonly known as:  PROTONIX Take 1 tablet (40 mg total) by mouth daily.  What changed:  when to take this   predniSONE 5 MG tablet Commonly known as:  DELTASONE Take 5 mg by mouth daily.   STEP N REST WALKER Misc Use as directed   Vitamin D (Ergocalciferol) 50000 units Caps capsule Commonly known as:  DRISDOL Take 50,000 Units by mouth every Sunday.       Disposition:  Home  Discharge Instructions    Diet - low sodium heart healthy   Complete by:  As directed    Increase activity slowly   Complete by:  As directed      Follow-up Information    [place] [facility] Follow up on 09/19/2017.   Specialty:  Cardiology Why:  3:30PM, wound check visit Contact information: [address] [place]       Evans Lance, MD Follow up on 12/09/2017.   Specialty:  Cardiology Why:  2:15PM Contact information: [address] [address] [phone]           Duration of Discharge Encounter: Greater than 30 minutes including physician time.  Venetia Night, PA-C 09/08/2017 11:45 AM  EP Attending  Patient seen and examined. Agree with above. CXR reviewed. He has had a slight retraction of the LV lead ("I was restless last night and had a hard time getting comfortable and moved all over the bed"), but otherwise device  interrogated and reprogrammed under my direct supervision demonstrating normal function. We will dc home with usual followup.  Mikle Bosworth.D.

## 2017-09-07 NOTE — Progress Notes (Signed)
Pt arrives to 3 east from cath lab with left arm sling in place. Dressing at pacemaker site is clean/pressure wrapped. Pt denies complaints, set up for frequent vitals. Per pt he typically ambulates with a walker, though with left arm restrictions will be required to use a urinal. Waist strap applied to sling.

## 2017-09-07 NOTE — Interval H&P Note (Signed)
History and Physical Interval Note:  09/07/2017 2:46 PM  [name]  has presented today for surgery, with the diagnosis of chronic systolic hf  The various methods of treatment have been discussed with the patient and family. After consideration of risks, benefits and other options for treatment, the patient has consented to  Procedure(s): BIV PACEMAKER UPGRADE (N/A) as a surgical intervention .  The patient's history has been reviewed, patient examined, no change in status, stable for surgery.  I have reviewed the patient's chart and labs.  Questions were answered to the patient's satisfaction.     Christopher Baker

## 2017-09-08 ENCOUNTER — Ambulatory Visit (HOSPITAL_COMMUNITY): Payer: PPO

## 2017-09-08 ENCOUNTER — Encounter (HOSPITAL_COMMUNITY): Payer: Self-pay | Admitting: Internal Medicine

## 2017-09-08 DIAGNOSIS — Z881 Allergy status to other antibiotic agents status: Secondary | ICD-10-CM | POA: Diagnosis not present

## 2017-09-08 DIAGNOSIS — Z885 Allergy status to narcotic agent status: Secondary | ICD-10-CM | POA: Diagnosis not present

## 2017-09-08 DIAGNOSIS — I110 Hypertensive heart disease with heart failure: Secondary | ICD-10-CM | POA: Diagnosis not present

## 2017-09-08 DIAGNOSIS — Z452 Encounter for adjustment and management of vascular access device: Secondary | ICD-10-CM | POA: Diagnosis not present

## 2017-09-08 DIAGNOSIS — I5022 Chronic systolic (congestive) heart failure: Secondary | ICD-10-CM

## 2017-09-08 DIAGNOSIS — Z79899 Other long term (current) drug therapy: Secondary | ICD-10-CM | POA: Diagnosis not present

## 2017-09-08 DIAGNOSIS — I447 Left bundle-branch block, unspecified: Secondary | ICD-10-CM | POA: Diagnosis not present

## 2017-09-08 DIAGNOSIS — Z4502 Encounter for adjustment and management of automatic implantable cardiac defibrillator: Secondary | ICD-10-CM | POA: Diagnosis not present

## 2017-09-08 DIAGNOSIS — G4733 Obstructive sleep apnea (adult) (pediatric): Secondary | ICD-10-CM | POA: Diagnosis not present

## 2017-09-08 DIAGNOSIS — Z950 Presence of cardiac pacemaker: Secondary | ICD-10-CM | POA: Diagnosis not present

## 2017-09-08 DIAGNOSIS — Z91013 Allergy to seafood: Secondary | ICD-10-CM | POA: Diagnosis not present

## 2017-09-08 DIAGNOSIS — Z7901 Long term (current) use of anticoagulants: Secondary | ICD-10-CM | POA: Diagnosis not present

## 2017-09-08 DIAGNOSIS — Z888 Allergy status to other drugs, medicaments and biological substances status: Secondary | ICD-10-CM | POA: Diagnosis not present

## 2017-09-08 LAB — BASIC METABOLIC PANEL
Anion gap: 9 (ref 5–15)
BUN: 20 mg/dL (ref 6–20)
CALCIUM: 9.1 mg/dL (ref 8.9–10.3)
CHLORIDE: 110 mmol/L (ref 101–111)
CO2: 23 mmol/L (ref 22–32)
CREATININE: 1.13 mg/dL (ref 0.61–1.24)
GFR calc non Af Amer: 59 mL/min — ABNORMAL LOW (ref >60)
GLUCOSE: 95 mg/dL (ref 65–99)
Potassium: 4.1 mmol/L (ref 3.5–5.1)
Sodium: 142 mmol/L (ref 135–145)

## 2017-09-08 LAB — MAGNESIUM: Magnesium: 2.3 mg/dL (ref 1.7–2.4)

## 2017-09-08 MED ORDER — ACETAMINOPHEN 500 MG PO TABS
1000.0000 mg | ORAL_TABLET | Freq: Two times a day (BID) | ORAL | Status: DC
  Administered 2017-09-08: 1000 mg via ORAL
  Filled 2017-09-08: qty 2

## 2017-09-08 NOTE — Evaluation (Signed)
Physical Therapy Evaluation Patient Details Name: Christopher Baker MRN: [id number] DOB: Feb 26, 1934 Today's Date: 09/08/2017   History of Present Illness  Christopher Baker returns today for ongoing followup of persistent atrial fib, chronic systolic heart failure, perisistent edema, s/p pacemaker upgrade 09/07/17; PMHx: R reverse TSA, bil TKA, back surgery, s/p DDD PM, and chronic venous stasis of the lower extremities, CVA  Clinical Impression  Patient evaluated by Physical Therapy with no further acute PT needs identified. All education has been completed and the patient has no further questions. Pt amb with rollator at his baseline, amb today with RW and advised to rest LUE(not push through LUE) and to continue same at home d/t pacemaker upgrade yesterday. Pt feels he is at his baseline and no f/u indicated; will continue to follow; See below for any follow-up Physical Therapy or equipment needs. PT is signing off. Thank you for this referral.   Follow Up Recommendations No PT follow up    Equipment Recommendations  None recommended by PT    Recommendations for Other Services       Precautions / Restrictions Precautions Precautions: Fall Required Braces or Orthoses: Sling(L UE to minimze movement post pacemaker) Restrictions Other Position/Activity Restrictions: pt declines sling; instructed to minimize shoulder flexion and abduction, uses RW only to rest arm, advised no heavy WBing through LUE, pt verbalizes       Mobility  Bed Mobility               General bed mobility comments: OOB and up to sink with RW and nurse tech  Transfers Overall transfer level: Needs assistance Equipment used: None Transfers: Sit to/from Stand Sit to Stand: Supervision         General transfer comment: instructed to avoid pushing up with LUE  Ambulation/Gait Ambulation/Gait assistance: Supervision Ambulation Distance (Feet): 90 Feet Assistive device: Rolling walker (2 wheeled) Gait  Pattern/deviations: Step-through pattern;Decreased stride length;Trunk flexed Gait velocity: decr   General Gait Details: advised only light use of LUE, pt is mildly unsteady initially however no overt LOB  Stairs            Wheelchair Mobility    Modified Rankin (Stroke Patients Only)       Balance Overall balance assessment: (denies falls) Sitting-balance support: Feet supported;No upper extremity supported Sitting balance-Leahy Scale: Good     Standing balance support: Single extremity supported;No upper extremity supported Standing balance-Leahy Scale: Fair                               Pertinent Vitals/Pain Pain Assessment: 0-10 Pain Score: 3  Pain Location: back (pt reports as chronic) Pain Descriptors / Indicators: Constant Pain Intervention(s): Monitored during session    Home Living Family/patient expects to be discharged to:: Private residence Living Arrangements: Spouse/significant other Available Help at Discharge: Available PRN/intermittently;Available 24 hours/day Type of Home: House Home Access: Stairs to enter Entrance Stairs-Rails: Psychiatric nurse of Steps: 3 Home Layout: Able to live on main level with bedroom/bathroom Home Equipment: Walker - 4 wheels      Prior Function Level of Independence: Independent with assistive device(s)         Comments: amb with rollator      Hand Dominance        Extremity/Trunk Assessment   Upper Extremity Assessment Upper Extremity Assessment: RUE deficits/detail;LUE deficits/detail;Generalized weakness RUE Deficits / Details: hx of reverse TSA LUE Deficits / Details: NT d/t pacemaker upgrade  yesterday            Communication   Communication: No difficulties  Cognition Arousal/Alertness: Awake/alert Behavior During Therapy: WFL for tasks assessed/performed Overall Cognitive Status: Within Functional Limits for tasks assessed                                         General Comments      Exercises     Assessment/Plan    PT Assessment Patent does not need any further PT services  PT Problem List         PT Treatment Interventions      PT Goals (Current goals can be found in the Care Plan section)  Acute Rehab PT Goals Patient Stated Goal: to go home today PT Goal Formulation: All assessment and education complete, DC therapy    Frequency     Barriers to discharge        Co-evaluation               AM-PAC PT "6 Clicks" Daily Activity  Outcome Measure Difficulty turning over in bed (including adjusting bedclothes, sheets and blankets)?: A Little Difficulty moving from lying on back to sitting on the side of the bed? : A Lot Difficulty sitting down on and standing up from a chair with arms (e.g., wheelchair, bedside commode, etc,.)?: A Little Help needed moving to and from a bed to chair (including a wheelchair)?: A Little Help needed walking in hospital room?: A Little Help needed climbing 3-5 steps with a railing? : A Little 6 Click Score: 17    End of Session Equipment Utilized During Treatment: Gait belt Activity Tolerance: Patient tolerated treatment well Patient left: in chair;with call bell/phone within reach   PT Visit Diagnosis: Difficulty in walking, not elsewhere classified (R26.2)    Time: 7867-6720 PT Time Calculation (min) (ACUTE ONLY): 15 min   Charges:   PT Evaluation $PT Eval Low Complexity: 1 Low     PT G CodesKenyon Ana, PT Pager: [phone] 09/08/2017   [facility] 09/08/2017, 11:18 AM

## 2017-09-08 NOTE — Progress Notes (Signed)
Pt refusing to stand for weight post-op. PT to evaluate this patient. Will continue to monitor.

## 2017-09-08 NOTE — Plan of Care (Signed)
  Problem: Education: Goal: Ability to verbalize understanding of medication therapies will improve Outcome: Progressing   Problem: Activity: Goal: Capacity to carry out activities will improve Outcome: Progressing   Problem: Education: Goal: Ability to demonstrate management of disease process will improve Outcome: Progressing   

## 2017-09-08 NOTE — Progress Notes (Signed)
Patient and wife has been provided discharge instructions including activity with the left extremity, signs and systems of incision infection, follow up appointment and diet. Both patient and spouse verbalized understanding of instructions.

## 2017-09-09 ENCOUNTER — Ambulatory Visit: Payer: PPO | Admitting: Psychology

## 2017-09-13 DIAGNOSIS — G894 Chronic pain syndrome: Secondary | ICD-10-CM | POA: Diagnosis not present

## 2017-09-13 DIAGNOSIS — M5417 Radiculopathy, lumbosacral region: Secondary | ICD-10-CM | POA: Diagnosis not present

## 2017-09-13 DIAGNOSIS — M961 Postlaminectomy syndrome, not elsewhere classified: Secondary | ICD-10-CM | POA: Diagnosis not present

## 2017-09-15 ENCOUNTER — Other Ambulatory Visit: Payer: Self-pay | Admitting: Anesthesiology

## 2017-09-15 ENCOUNTER — Telehealth: Payer: Self-pay | Admitting: *Deleted

## 2017-09-15 DIAGNOSIS — G894 Chronic pain syndrome: Secondary | ICD-10-CM

## 2017-09-15 NOTE — Progress Notes (Signed)
Phone call to patient to verify medication list and allergies. Pt made aware will need to stop cymbalta 48hrs prior to myelogram appointment. Pt will also need to stop eliquis 48hrs prior to myelogram procedure, pending authorization from cardiologist. Pt verbalized understanding.

## 2017-09-15 NOTE — Telephone Encounter (Signed)
   [facility] Pre-operative Risk Assessment    Request for surgical clearance:  1. What type of surgery is being performed? MYELOGRAM    2. When is this surgery scheduled? PENDING CLEARANCE   3. What type of clearance is required (medical clearance vs. Pharmacy clearance to hold med vs. Both)? PHARMACY CLEARANCE  4. Are there any medications that need to be held prior to surgery and how long? ELIQUIS FOR 48 HOURS   5. Practice name and name of physician performing surgery? [facility]   6. What is your office phone and fax number? [place] [phone]   7. Anesthesia type (None, local, MAC, general) ? NOT Tonita Phoenix 09/15/2017, 4:33 PM  _________________________________________________________________   (provider comments below)

## 2017-09-19 ENCOUNTER — Ambulatory Visit (INDEPENDENT_AMBULATORY_CARE_PROVIDER_SITE_OTHER): Payer: PPO | Admitting: *Deleted

## 2017-09-19 DIAGNOSIS — I5022 Chronic systolic (congestive) heart failure: Secondary | ICD-10-CM | POA: Diagnosis not present

## 2017-09-19 LAB — CUP PACEART INCLINIC DEVICE CHECK
Battery Remaining Longevity: 57 mo
Battery Voltage: 3.04 V
Brady Statistic AS VS Percent: 4.69 %
Brady Statistic RA Percent Paced: 44.77 %
Implantable Lead Location: 753859
Implantable Lead Location: 753860
Implantable Lead Model: 4076
Implantable Lead Model: 4598
Implantable Pulse Generator Implant Date: 20190320
Lead Channel Impedance Value: 266 Ohm
Lead Channel Impedance Value: 361 Ohm
Lead Channel Impedance Value: 570 Ohm
Lead Channel Impedance Value: 589 Ohm
Lead Channel Impedance Value: 646 Ohm
Lead Channel Pacing Threshold Amplitude: 1.125 V
Lead Channel Pacing Threshold Amplitude: 2 V
Lead Channel Pacing Threshold Pulse Width: 0.4 ms
Lead Channel Pacing Threshold Pulse Width: 0.4 ms
Lead Channel Sensing Intrinsic Amplitude: 0.75 mV
Lead Channel Sensing Intrinsic Amplitude: 1.5 mV
Lead Channel Sensing Intrinsic Amplitude: 5.875 mV
Lead Channel Setting Pacing Amplitude: 4 V
Lead Channel Setting Pacing Pulse Width: 0.4 ms
Lead Channel Setting Sensing Sensitivity: 2.8 mV
MDC IDC LEAD IMPLANT DT: 20111215
MDC IDC LEAD IMPLANT DT: 20111215
MDC IDC LEAD IMPLANT DT: 20190320
MDC IDC LEAD LOCATION: 753858
MDC IDC MSMT LEADCHNL LV IMPEDANCE VALUE: 380 Ohm
MDC IDC MSMT LEADCHNL LV IMPEDANCE VALUE: 665 Ohm
MDC IDC MSMT LEADCHNL LV IMPEDANCE VALUE: 760 Ohm
MDC IDC MSMT LEADCHNL RA PACING THRESHOLD AMPLITUDE: 0.5 V
MDC IDC MSMT LEADCHNL RA PACING THRESHOLD PULSEWIDTH: 0.4 ms
MDC IDC MSMT LEADCHNL RV IMPEDANCE VALUE: 304 Ohm
MDC IDC MSMT LEADCHNL RV IMPEDANCE VALUE: 380 Ohm
MDC IDC MSMT LEADCHNL RV SENSING INTR AMPL: 5.875 mV
MDC IDC SESS DTM: 20190401165726
MDC IDC SET LEADCHNL LV PACING AMPLITUDE: 2.75 V
MDC IDC SET LEADCHNL RA PACING AMPLITUDE: 3.5 V
MDC IDC SET LEADCHNL RV PACING PULSEWIDTH: 0.8 ms
MDC IDC STAT BRADY AP VP PERCENT: 43.27 %
MDC IDC STAT BRADY AP VS PERCENT: 0.05 %
MDC IDC STAT BRADY AS VP PERCENT: 51.99 %
MDC IDC STAT BRADY RV PERCENT PACED: 95.26 %

## 2017-09-19 NOTE — Patient Instructions (Signed)
Wound check appointment. Steri-strips removed. Wound without redness or edema. Incision edges approximated, wound well healed. Normal device function. Thresholds, sensing, and impedances consistent with implant measurements. Device programmed at chronic values s/p gen change with new LV lead at 2.75V/0.69ms. Histogram distribution appropriate for patient and level of activity. No mode switches or high ventricular rates noted. Patient educated about wound care, arm mobility, lifting restrictions. ROV with GT 6/21

## 2017-09-19 NOTE — Telephone Encounter (Signed)
Recent underwent BiV PPM upgrade, wound clinic today. H/o afib, will forward to clinical pharmacist. Cardiac clearance APP to reach out to the patient afterward  Signed, Almyra Deforest PA Pager: [phone]

## 2017-09-20 DIAGNOSIS — M546 Pain in thoracic spine: Secondary | ICD-10-CM | POA: Diagnosis not present

## 2017-09-20 DIAGNOSIS — R310 Gross hematuria: Secondary | ICD-10-CM | POA: Diagnosis not present

## 2017-09-20 DIAGNOSIS — M4155 Other secondary scoliosis, thoracolumbar region: Secondary | ICD-10-CM | POA: Diagnosis not present

## 2017-09-20 DIAGNOSIS — M47816 Spondylosis without myelopathy or radiculopathy, lumbar region: Secondary | ICD-10-CM | POA: Diagnosis not present

## 2017-09-20 DIAGNOSIS — M5136 Other intervertebral disc degeneration, lumbar region: Secondary | ICD-10-CM | POA: Diagnosis not present

## 2017-09-20 DIAGNOSIS — N3041 Irradiation cystitis with hematuria: Secondary | ICD-10-CM | POA: Diagnosis not present

## 2017-09-20 DIAGNOSIS — R319 Hematuria, unspecified: Secondary | ICD-10-CM | POA: Diagnosis not present

## 2017-09-20 DIAGNOSIS — Z981 Arthrodesis status: Secondary | ICD-10-CM | POA: Diagnosis not present

## 2017-09-20 DIAGNOSIS — M545 Low back pain: Secondary | ICD-10-CM | POA: Diagnosis not present

## 2017-09-20 DIAGNOSIS — Z8546 Personal history of malignant neoplasm of prostate: Secondary | ICD-10-CM | POA: Diagnosis not present

## 2017-09-20 DIAGNOSIS — F0280 Dementia in other diseases classified elsewhere without behavioral disturbance: Secondary | ICD-10-CM | POA: Diagnosis not present

## 2017-09-20 NOTE — Telephone Encounter (Signed)
I agree. Follow guidelines. He does have a small risk for doing this. I am skeptical about performing this as I do not think he would be a candidate for spinal sugery. GT

## 2017-09-20 NOTE — Telephone Encounter (Signed)
Patient with diagnosis of Afib on Eliquis for anticoagulation.    Procedure: myelogram Date of procedure: TBD  CHADS2-VASc score of  8 (CHF, HTN, AGE, DM2, stroke/tia x 2, CAD, AGE, male)  CrCl 49ml/min  Generally for spinal procedures recommend holding DOAC for 3 days prior to procedure. Physician requesting 48 hour hold. With history of stroke will route to Dr. Lovena Le for recommendation of length of hold. Would recommend resume as soon as safe post-procedure given stroke.

## 2017-09-22 NOTE — Telephone Encounter (Signed)
I spoke with Ms Cosens today. She tells me her husbands neurosurgeon Dr Rita Ohara told them he did not feel the patient was an operative candidate, Dr Lovena Le also expressed this view. As far as pre op myelogram anticoagulation recommendations I spoke again with our pharmacist and the recommendation is to hold Eliquis for 3 days pre myelogram, Lovenox crossover is not recommended.   Kerin Ransom PA-C 09/22/2017 10:15 AM

## 2017-09-26 DIAGNOSIS — M2550 Pain in unspecified joint: Secondary | ICD-10-CM | POA: Diagnosis not present

## 2017-09-26 DIAGNOSIS — E663 Overweight: Secondary | ICD-10-CM | POA: Diagnosis not present

## 2017-09-26 DIAGNOSIS — L4059 Other psoriatic arthropathy: Secondary | ICD-10-CM | POA: Diagnosis not present

## 2017-09-26 DIAGNOSIS — M5136 Other intervertebral disc degeneration, lumbar region: Secondary | ICD-10-CM | POA: Diagnosis not present

## 2017-09-26 DIAGNOSIS — M545 Low back pain: Secondary | ICD-10-CM | POA: Diagnosis not present

## 2017-09-26 DIAGNOSIS — Z6826 Body mass index (BMI) 26.0-26.9, adult: Secondary | ICD-10-CM | POA: Diagnosis not present

## 2017-10-04 ENCOUNTER — Ambulatory Visit: Payer: PPO | Admitting: Neurology

## 2017-10-04 ENCOUNTER — Encounter: Payer: Self-pay | Admitting: Neurology

## 2017-10-04 VITALS — BP 128/64 | HR 68 | Ht 75.0 in | Wt 207.6 lb

## 2017-10-04 DIAGNOSIS — R2681 Unsteadiness on feet: Secondary | ICD-10-CM

## 2017-10-04 DIAGNOSIS — R292 Abnormal reflex: Secondary | ICD-10-CM

## 2017-10-04 DIAGNOSIS — I679 Cerebrovascular disease, unspecified: Secondary | ICD-10-CM

## 2017-10-04 DIAGNOSIS — F0390 Unspecified dementia without behavioral disturbance: Secondary | ICD-10-CM

## 2017-10-04 NOTE — Progress Notes (Signed)
NEUROLOGY FOLLOW UP OFFICE NOTE  Christopher Baker [id number]  HISTORY OF PRESENT ILLNESS: Christopher Baker is an 84 year old right-handed man with hypertension, hyperlipidemia, paroxysmal atrial fibrillation, PPM implant for syncope, and history of prostate cancer status post radiation in 2007, who follows up for vascular dementia and falls.  He is accompanied by his wife who supplements history.   UPDATE: He is taking Aricept.  He is getting monthly B12 shots.  He is now using his CPAP regularly.     He is dependent on the walker.  He has not had recent falls.  He has PT for strength training but not for balance and gait.  He is undergoing acupuncture for back pain, which has helped.  Last month, his pacemaker was replaced with a MRI-compatible pacemaker.   HISTORY: I.  Cardio-embolic stroke He was admitted to the hospital on 04/03/13 with left hand and arm weakness and facial droop.  CT head revealed right basal ganglia and parietal lobe infarcts.  He was found to have atrial fibrillation.  He was started on Xarelto.  He was admitted to [facility] on 06/18/14 for confusion.  Episode lasted about 1 hour.  He was at dinner with his wife and had difficulty ordering from the menu.  His wife noted a left facial droop.  He did not have any slurred speech, focal numbness or weakness of the extremities.  His blood pressure was found to be in the 190s/80s.  CT of head revealed no abnormality.  Repeated CT of the head the following day showed no acute stroke.  Echo showed mildly decreased EF of 45-50%.  Carotid doppler showed 40% bilateral ICA stenosis.  An EEG was performed, which was negative.  Differential diagnosis included TIA verse seizure and he was started on Keppra 250mg  twice daily.  He was also restarted on lisinopril, which had been discontinued earlier in the month.  Since discharge, he exhibited lethargy and irritability.  Keppra was subsequently discontinued as a TIA was suspected rather than  partial seizure.   II. Vascular cognitive impairment Over the last 3 years, he has had problems with memory.  He was repeating things and exhibited mental fogginess and lack of motivation.  He was experiencing depression and insomnia as well.  Procedural tasks and executive functioning were intact.  He was found to have a B12 level of 52 and was started on B12 shots.  He and his wife had noticed improvement in his memory.  However, insomnia and depression were still both an issue.  He had been under a lot of stress.  He was started on Celexa for depression and Proson for insomnia.  Last year, he exhibited extreme lethargy, to the point where he was sleeping during the day, which he never previously done.  Sometimes, he was in such a deep sleep, it required repeated yelling and shaking to wake him up.  He seemed more "foggy" again.  One time, he couldn't figure out where to put the ice cream in the refrigerator.  When his wife told him to put it in the freezer, he was placing it in the vegetable drawer.  He decided to stop the Proson, and he and his wife noticed significant improvement in lethargy.  He did note some improvement in memory since starting B12 supplementation.  Memory seemed worse after the stroke, so cognitive deficits were thought to be also related to stroke.   He had a formal driving evaluation in July 2015.  He was  told to drive with someone else in the car for a couple of months so he can regain confidence.  The only restriction was that he should not drive at night unless it is a well-lit road.  He has since started driving alone sometimes, but only locally and during daylight hours.     He had formal neuropsych testing performed by Dr. Valentina Baker in January 2016.  Profile was consistent with outcome of a right hemispheric stroke: deficits in visual processing speed, non-dominant hand fine motor coordination/speed, and visual spatial dysfunction.  He underwent repeat neuropsychological testing  on 03/15/16, which revealed mild dementia, likely multifactorial (cerebrovascular, OSA, B12 deficiency).  He demonstrated frontal-subcortical involvement.  However, he demonstrated decline in verbal memory and language since his prior evaluation, suggesting left hemispheric involvement rather than right hemispheric involvement.  This appears to be more likely due to cerebrovascular disease rather than Alzheimer's.  He underwent neuropsychological testing on 03/15/16 with Dr. Si Baker, which revealed mild dementia, likely multifactorial (cerebrovascular, OSA, B12 deficiency).  He demonstrated frontal-subcortical involvement.  However, he demonstrated decline in verbal memory and language since his prior evaluation, suggesting left hemispheric involvement rather than right hemispheric involvement.  This appears to be more likely due to cerebrovascular disease rather than Alzheimer's.  Marland Kitchen  He underwent repeat neuropsychological testing with Dr. Si Baker on 03/21/17.  Testing did still demonstrate mild dementia but with significant improvement across multiple domains when compared to testing from a year ago (including learning and memory, processing speed, attention, language and executive functioning).     III Staring Spells: He reportedly has brief staring spells, lasting just seconds.  Routine EEG was performed on 03/17/16, which showed some diffuse slowing but no epileptiform discharges.  24 hour ambulatory EEG from 03/30/16 to 03/31/16 was performed and demonstrated mild diffuse slowing but otherwise unremarkable.  CT of head was performed on 03/31/16 was personally reviewed. It did not demonstrate a left hemispheric stroke, but it did show progressive atrophy compared to prior study from 10/31/14, and possible subacute or progression of chronic ischemia in the right parietal cortex.  Carotid doppler from 04/02/16 showed stable 40-59% left ICA stenosis but progressed right ICA stenosis at 60-79% (from 40-59% on 06/18/14).   Since starting B12, he feels better and more clear.   IV Tremor He notes mild tremor in his hands, noticeable when he holds a utensil or writes.  He first noticed this about a month or so ago.  He has some balance issues due to bilateral knee replacement surgeries.  No family history of dementia or tremor (possibly his mother had tremor).  He takes ropinirole for RLS   V Leg weakness/gait instability: He has had increased balance problems with low back pain with pain radiating down the right leg.  His legs feel weak.  Pain is worse when standing and walking.  He also notes right groin pain.  He was evaluated by his neurosurgeon and reportedly has arthritis but nothing surgical to be done.  He is followed by orthopedist and pain specialist.  He has had epidural injections as well as injection in the right hip, which has only exacerbated pain.  PAST MEDICAL HISTORY: Past Medical History:  Diagnosis Date  . Anxiety   . Arthritis    "hands, right hip; lower back" (02/28/2017)  . Arthritis with psoriasis ([place])   . Borderline diabetes    DIET CONTROLLED - PT STATES HE IS NOT DIABETIC  . Carotid stenosis, bilateral PER DR EARLY / DUPLEX  09-09-10  40 - 59%   BILATERALLY--- ASYMPTOMATIC  . Chronic lower back pain   . Depression   . Dysrhythmia    a-fib  . GERD (gastroesophageal reflux disease)    CONTROLLED W/ PROTONIX  . History of gout   . HTN (hypertension)   . Hyperlipidemia   . Iron deficiency   . Lumbar spondylosis W/ RADICULOPATHY  . OSA on CPAP   . PAF (paroxysmal atrial fibrillation) ([place])   . Presence of permanent cardiac pacemaker DDD-  06-04-10-   DDD; SECONDARY TO SYNCOPY AND BRADYCARDIA  . Prostate cancer ([place]) 2008   S/P 69 RADIATION  [place]    . Restless leg syndrome   . Stroke [facility]) Oct. 14, 2014   light left hand, balance issues, short term memory loss 2014  . SVT (supraventricular tachycardia) ([place])    S/P ABLATION   2008    MEDICATIONS: Current Outpatient  Medications on File Prior to Visit  Medication Sig Dispense Refill  . acetaminophen (TYLENOL) 500 MG tablet Take 1,000 mg by mouth 2 (two) times daily.    Marland Kitchen amoxicillin (AMOXIL) 500 MG capsule Take 2,000 mg by mouth See admin instructions. Take 2000 mg by mouth one hour prior to dental apointments    . Artificial Tear Ointment (LUBRICANT EYE OP) Place 2 drops into both eyes daily as needed (for dry eyes).    Marland Kitchen atorvastatin (LIPITOR) 20 MG tablet Take 20 mg by mouth daily.    . cyanocobalamin (,VITAMIN B-12,) 1000 MCG/ML injection Inject 1,000 mcg into the muscle every 30 (thirty) days.     Marland Kitchen dofetilide (TIKOSYN) 500 MCG capsule Take 1 capsule (500 mcg total) by mouth 2 (two) times daily. 60 capsule 6  . donepezil (ARICEPT) 10 MG tablet TAKE ONE TABLET BY MOUTH EVERY NIGHT AT BEDTIME (Patient taking differently: TAKE 10 MG BY MOUTH EVERY NIGHT AT BEDTIME) 90 tablet 2  . DULoxetine (CYMBALTA) 30 MG capsule Take 1 capsule (30 mg total) by mouth daily. (Patient taking differently: Take 30 mg by mouth at bedtime. ) 30 capsule 0  . ELIQUIS 5 MG TABS tablet TAKE 1 TABLET (5 MG TOTAL) BY MOUTH 2 (TWO) TIMES DAILY. 60 tablet 8  . ezetimibe (ZETIA) 10 MG tablet Take 1 tablet (10 mg total) by mouth daily. 90 tablet 1  . ferrous sulfate 325 (65 FE) MG tablet Take 325 mg by mouth 2 (two) times daily with a meal.     . folic acid (FOLVITE) 1 MG tablet Take 1 mg by mouth at bedtime.     . furosemide (LASIX) 40 MG tablet Take 80 mg by mouth daily.     Marland Kitchen lisinopril (PRINIVIL,ZESTRIL) 5 MG tablet TAKE ONE TABLET BY MOUTH DAILY (Patient taking differently: Take 10 mg by mouth at bedtime) 90 tablet 3  . loperamide (IMODIUM A-D) 2 MG tablet Take 2 mg by mouth 3 (three) times daily as needed for diarrhea or loose stools.     . methotrexate (RHEUMATREX) 2.5 MG tablet Take 15 mg by mouth every Monday.     . metoprolol succinate (TOPROL XL) 25 MG 24 hr tablet Take 1 tablet (25 mg total) by mouth daily. (Patient taking  differently: Take 25 mg by mouth at bedtime. ) 90 tablet 2  . Misc. Devices (STEP N REST WALKER) MISC Use as directed (Patient not taking: Reported on 08/31/2017) 1 each 0  . oxybutynin (DITROPAN) 5 MG tablet Take 5 mg by mouth 2 (two) times daily.     Marland Kitchen  oxyCODONE-acetaminophen (PERCOCET) 10-325 MG tablet Take 0.25-0.5 tablets by mouth 2 (two) times daily as needed for pain.     . pantoprazole (PROTONIX) 40 MG tablet Take 1 tablet (40 mg total) by mouth daily. (Patient taking differently: Take 40 mg by mouth at bedtime. ) 90 tablet 3  . predniSONE (DELTASONE) 5 MG tablet Take 5 mg by mouth daily.    . Vitamin D, Ergocalciferol, (DRISDOL) 50000 units CAPS capsule Take 50,000 Units by mouth every Sunday.      No current facility-administered medications on file prior to visit.     ALLERGIES: Allergies  Allergen Reactions  . Ciprofloxacin Nausea Only and Other (See Comments)    Stomach ache  . Hydrocodone-Acetaminophen Other (See Comments)    Hallucinations in higher doses  . Other Other (See Comments)    Rash from neoprene wrap after knee surgery  . Shellfish Allergy Nausea And Vomiting and Other (See Comments)    Reaction to scallops  . Tramadol Other (See Comments)    Pt becomes combative per wife    FAMILY HISTORY: Family History  Problem Relation Age of Onset  . Stroke Mother   . Early death Father     SOCIAL HISTORY: Social History   Socioeconomic History  . Marital status: Married    Spouse name: Not on file  . Number of children: Not on file  . Years of education: Not on file  . Highest education level: Not on file  Occupational History  . Occupation: retired  Scientific laboratory technician  . Financial resource strain: Not on file  . Food insecurity:    Worry: Not on file    Inability: Not on file  . Transportation needs:    Medical: Not on file    Non-medical: Not on file  Tobacco Use  . Smoking status: Former Smoker    Packs/day: 1.00    Years: 45.00    Pack years: 45.00      Types: Cigarettes    Last attempt to quit: 12/07/1998    Years since quitting: 18.8  . Smokeless tobacco: Never Used  Substance and Sexual Activity  . Alcohol use: Yes    Comment: 1 cocktail per evening  . Drug use: No  . Sexual activity: Not Currently    Partners: Female  Lifestyle  . Physical activity:    Days per week: Not on file    Minutes per session: Not on file  . Stress: Not on file  Relationships  . Social connections:    Talks on phone: Not on file    Gets together: Not on file    Attends religious service: Not on file    Active member of club or organization: Not on file    Attends meetings of clubs or organizations: Not on file    Relationship status: Not on file  . Intimate partner violence:    Fear of current or ex partner: Not on file    Emotionally abused: Not on file    Physically abused: Not on file    Forced sexual activity: Not on file  Other Topics Concern  . Not on file  Social History Narrative  . Not on file    REVIEW OF SYSTEMS: Constitutional: No fevers, chills, or sweats, no generalized fatigue, change in appetite Eyes: No visual changes, double vision, eye pain Ear, nose and throat: No hearing loss, ear pain, nasal congestion, sore throat Cardiovascular: No chest pain, palpitations Respiratory:  No shortness of breath at rest or  with exertion, wheezes GastrointestinaI: No nausea, vomiting, diarrhea, abdominal pain, fecal incontinence Genitourinary:  No dysuria, urinary retention or frequency Musculoskeletal:  No neck pain, back pain Integumentary: No rash, pruritus, skin lesions Neurological: as above Psychiatric: No depression, insomnia, anxiety Endocrine: No palpitations, fatigue, diaphoresis, mood swings, change in appetite, change in weight, increased thirst Hematologic/Lymphatic:  No purpura, petechiae. Allergic/Immunologic: no itchy/runny eyes, nasal congestion, recent allergic reactions, rashes  PHYSICAL EXAM: Vitals:    10/04/17 0948  BP: 128/64  Pulse: 68  SpO2: 97%   General: No acute distress.  Patient appears well-groomed.  normal body habitus. Head:  Normocephalic/atraumatic Eyes:  Fundi examined but not visualized Neck: supple, no paraspinal tenderness, full range of motion Heart:  Regular rate and rhythm Lungs:  Clear to auscultation bilaterally Back: No paraspinal tenderness Neurological Exam: alert and oriented to person, place, and time. Attention span and concentration intact, delayed recall poor, remote memory intact, fund of knowledge intact.  Speech fluent and not dysarthric, language intact.    MMSE - Mini Mental State Exam 10/04/2017 03/30/2017 09/23/2016  Not completed: - Unable to complete Unable to complete  Orientation to time 4 - -  Orientation to Place 5 - -  Registration 3 - -  Attention/ Calculation 5 - -  Recall 2 - -  Language- name 2 objects 2 - -  Language- repeat 1 - -  Language- follow 3 step command 3 - -  Language- read & follow direction 1 - -  Write a sentence 1 - -  Copy design 1 - -  Total score 28 - -   CN II-XII intact. Bulk and tone normal, muscle strength 5/5 throughout.  Reduced finger-thumb tapping speed bilaterally.  Finger to nose testing intact with fine postural and kinetic tremor.  Reduced pinprick and vibration sensation in feet and hands.  Deep tendon reflexes trace throughout except absent in ankles and right patellar.  Questionable bilateral Babinski.  Short strides.  Romberg positive.  IMPRESSION: 1. Mild dementia, unspecified.  Etiology unclear.  Given the significant improvement in cognitive testing performance, a neurodegenerative disease such as Alzheimer's is unlikely.  Practice testing effect would not account for such a dramatic improvement.  I think it is still vascular related, but unusual to have such an improvement in cognitive testing.  Possibly the Aricept and treatment of OSA has contributed to improvement. 2.  Gait instability.  Now with  questionable Babinski.  Consider myelopathy but no other signs or symptoms to suggest this.  PLAN: 1.  Continue Aricept 2.  Check MRI of cervical spine to evaluate for stenosis causing spinal cord impingement.  If positive, then refer to neurosurgery.  If unremarkable, then order PT for gait/balance. 3.  Follow up in 6 months.  25 minutes spent face to face with patient, over 50% spent discussing management.  Metta Clines, DO  CC:  Domenick Gong, MD

## 2017-10-04 NOTE — Progress Notes (Signed)
Called centralized scheduling, spoke with Manuela Schwartz, advsd her I entered an order for MRI C-spine w/o. Pt has pacer, implanted 09/07/17 at [facility], is [employer] serial [id number]. Manuela Schwartz to forward to MRI department, when ok'd she will contact Pt to schedule.

## 2017-10-04 NOTE — Patient Instructions (Signed)
Continue current management I want to get MRI of cervical spine to look for any pinching of the spinal cord that may be a cause for balance problems.  If there is something, then I will send you to [facility].  If not, I will order you physical therapy.  Otherwise, follow up in 6 months.

## 2017-10-05 ENCOUNTER — Other Ambulatory Visit: Payer: Self-pay

## 2017-10-07 DIAGNOSIS — N3041 Irradiation cystitis with hematuria: Secondary | ICD-10-CM | POA: Diagnosis not present

## 2017-10-07 DIAGNOSIS — R310 Gross hematuria: Secondary | ICD-10-CM | POA: Diagnosis not present

## 2017-10-07 DIAGNOSIS — Z8546 Personal history of malignant neoplasm of prostate: Secondary | ICD-10-CM | POA: Diagnosis not present

## 2017-10-07 DIAGNOSIS — R339 Retention of urine, unspecified: Secondary | ICD-10-CM | POA: Diagnosis not present

## 2017-10-10 DIAGNOSIS — Z960 Presence of urogenital implants: Secondary | ICD-10-CM | POA: Diagnosis not present

## 2017-10-10 DIAGNOSIS — R339 Retention of urine, unspecified: Secondary | ICD-10-CM | POA: Diagnosis not present

## 2017-10-10 DIAGNOSIS — R3989 Other symptoms and signs involving the genitourinary system: Secondary | ICD-10-CM | POA: Diagnosis not present

## 2017-10-10 DIAGNOSIS — R310 Gross hematuria: Secondary | ICD-10-CM | POA: Diagnosis not present

## 2017-10-11 ENCOUNTER — Telehealth: Payer: Self-pay | Admitting: Internal Medicine

## 2017-10-11 DIAGNOSIS — R339 Retention of urine, unspecified: Secondary | ICD-10-CM | POA: Diagnosis not present

## 2017-10-11 DIAGNOSIS — R310 Gross hematuria: Secondary | ICD-10-CM | POA: Diagnosis not present

## 2017-10-11 DIAGNOSIS — N3040 Irradiation cystitis without hematuria: Secondary | ICD-10-CM | POA: Diagnosis not present

## 2017-10-11 DIAGNOSIS — Z960 Presence of urogenital implants: Secondary | ICD-10-CM | POA: Diagnosis not present

## 2017-10-11 NOTE — Telephone Encounter (Signed)
Pt takes Eliquis for afib with CHADS2VASc score of 7 (age x2, HTN, CHF, CVA, CAD). Renal function is normal. Would prefer pt hold Eliquis for only 24 hours prior to procedure if possible due to elevated cardiovascular risk and history of CVA.

## 2017-10-11 NOTE — Telephone Encounter (Signed)
Having emergency surgery 47-26-19 -he has radiation induced cystitis and blood clots are blocking his urethra , needs to cone off Eliquis 2 days prior-pls call to give ok to Dr. Alona Bene [phone]- Pt [phone]

## 2017-10-11 NOTE — Telephone Encounter (Signed)
° °  [facility] Pre-operative Risk Assessment    Request for surgical clearance:  1. What type of surgery is being performed? Radiation cystitus -urethral blockage  2. When is this surgery scheduled? 10/14/17  3. What type of clearance is required (medical clearance vs. Pharmacy clearance to hold med vs. Both)? med  4. Are there any medications that need to be held prior to surgery and how long?Eliquis-2 days  5. Practice name and name of physician performing surgery? [facility]-Dr. Alona Bene  6. What is your office phone [phone]   7.   What is your office fax number  8.   Anesthesia type (None, local, MAC, general) ? general   Melissa A Tatum 10/11/2017, 1:07 PM  _________________________________________________________________   (provider comments below)

## 2017-10-11 NOTE — Telephone Encounter (Signed)
Spoke with pt's wife per dpr she is aware that her husband can hold his Eliquis 24 hrs prior to procedure. She verbalized understanding.

## 2017-10-11 NOTE — Telephone Encounter (Signed)
   Primary Cardiologist: Dr. Cristopher Peru  Chart reviewed as part of pre-operative protocol coverage. Patient was contacted 10/11/2017 in reference to pre-operative risk assessment for pending surgery as outlined below.  Christopher Baker was last seen on 09/07/17  by Dr. Lovena Le when he had an upgrade of his PPM to a BiV CRT-P device.  Since that day, Christopher Baker has done very well with much improvement in his fluid status. He is not having any chest pain or shortness of breath. He does have systolic CHF with EF 03% with other co morbidities and is thus a high risk for any procedure. His symptoms are currently improved with the addition of cardiac resynchronization therapy.   Therefore, based on ACC/AHA guidelines, the patient would be at increased but acceptable risk for this necessary procedure without further cardiovascular testing. His fluid status will need to be closely monitored with care to avoid volume overload.   Per our pharmacy recommendations:  Pt takes Eliquis for afib with CHADS2VASc score of 7 (age x2, HTN, CHF, CVA, CAD). Renal function is normal. Would prefer pt hold Eliquis for only 24 hours prior to procedure if possible due to elevated cardiovascular risk and history of CVA.   I will route this recommendation to the requesting party via Epic fax function and remove from pre-op pool.  Please call with questions.  Daune Perch, NP 10/11/2017, 3:43 PM

## 2017-10-12 DIAGNOSIS — I4891 Unspecified atrial fibrillation: Secondary | ICD-10-CM | POA: Diagnosis not present

## 2017-10-12 DIAGNOSIS — D414 Neoplasm of uncertain behavior of bladder: Secondary | ICD-10-CM | POA: Diagnosis not present

## 2017-10-12 DIAGNOSIS — R319 Hematuria, unspecified: Secondary | ICD-10-CM | POA: Diagnosis not present

## 2017-10-12 DIAGNOSIS — J45909 Unspecified asthma, uncomplicated: Secondary | ICD-10-CM | POA: Diagnosis not present

## 2017-10-12 DIAGNOSIS — Z7901 Long term (current) use of anticoagulants: Secondary | ICD-10-CM | POA: Diagnosis not present

## 2017-10-12 DIAGNOSIS — I471 Supraventricular tachycardia: Secondary | ICD-10-CM | POA: Diagnosis not present

## 2017-10-12 DIAGNOSIS — R748 Abnormal levels of other serum enzymes: Secondary | ICD-10-CM | POA: Diagnosis not present

## 2017-10-12 DIAGNOSIS — I5022 Chronic systolic (congestive) heart failure: Secondary | ICD-10-CM | POA: Diagnosis not present

## 2017-10-12 DIAGNOSIS — I481 Persistent atrial fibrillation: Secondary | ICD-10-CM | POA: Diagnosis not present

## 2017-10-12 DIAGNOSIS — R310 Gross hematuria: Secondary | ICD-10-CM | POA: Diagnosis not present

## 2017-10-12 DIAGNOSIS — F0150 Vascular dementia without behavioral disturbance: Secondary | ICD-10-CM | POA: Diagnosis not present

## 2017-10-12 DIAGNOSIS — Z8673 Personal history of transient ischemic attack (TIA), and cerebral infarction without residual deficits: Secondary | ICD-10-CM | POA: Diagnosis not present

## 2017-10-12 DIAGNOSIS — Z950 Presence of cardiac pacemaker: Secondary | ICD-10-CM | POA: Diagnosis not present

## 2017-10-12 DIAGNOSIS — Z87891 Personal history of nicotine dependence: Secondary | ICD-10-CM | POA: Diagnosis not present

## 2017-10-12 DIAGNOSIS — I110 Hypertensive heart disease with heart failure: Secondary | ICD-10-CM | POA: Diagnosis not present

## 2017-10-12 DIAGNOSIS — I878 Other specified disorders of veins: Secondary | ICD-10-CM | POA: Diagnosis not present

## 2017-10-12 DIAGNOSIS — E785 Hyperlipidemia, unspecified: Secondary | ICD-10-CM | POA: Diagnosis not present

## 2017-10-12 DIAGNOSIS — I428 Other cardiomyopathies: Secondary | ICD-10-CM | POA: Diagnosis not present

## 2017-10-12 DIAGNOSIS — I248 Other forms of acute ischemic heart disease: Secondary | ICD-10-CM | POA: Diagnosis not present

## 2017-10-14 ENCOUNTER — Telehealth: Payer: Self-pay | Admitting: Internal Medicine

## 2017-10-14 NOTE — Telephone Encounter (Signed)
I made Mrs. Eastmond aware that Christopher Baker has a Medtronic CRTP and he has 3 leads, it was implanted 09/07/17 and he is dependant. If she needs more information I have advised them to contact [employer] as it is late Friday afternoon. She verbalizes understanding.

## 2017-10-14 NOTE — Telephone Encounter (Signed)
[name] is calling and stated pt is at [facility] and stated he may have had a mild heart attack during surgery this morning. They need to know the date the new pacemaker was put in and if it's an A Pace or B Pace. Please advise asap

## 2017-10-17 ENCOUNTER — Encounter: Payer: Self-pay | Admitting: Neurology

## 2017-10-17 DIAGNOSIS — R9431 Abnormal electrocardiogram [ECG] [EKG]: Secondary | ICD-10-CM | POA: Diagnosis not present

## 2017-10-18 DIAGNOSIS — Z466 Encounter for fitting and adjustment of urinary device: Secondary | ICD-10-CM | POA: Diagnosis not present

## 2017-10-18 DIAGNOSIS — R339 Retention of urine, unspecified: Secondary | ICD-10-CM | POA: Diagnosis not present

## 2017-10-19 ENCOUNTER — Telehealth: Payer: Self-pay | Admitting: Internal Medicine

## 2017-10-19 NOTE — Telephone Encounter (Signed)
Patient's wife calling and states that the patient recently had cystoscopy w/ fulgaration/evacuation of clots done at [facility] by Dr. Amalia Hailey on 10-14-17 for which he held his Eliquis for 24 hours and restarted on 10-16-17. Patient had a run of SVT during the procedure and elevated troponin in PACU and cardiology was consulted. Cardiology at [facility] felt that this was consistent with demand ischemia in the presence of chronic NICM with reduced EF. An echo was done while he was there that showed severe AS. Wife states that the patient has an appointment with Dr. Lovena Le on 12/09/17. This appointment is the patient's 91 day follow up appointment post BIV PPM Upgrade done on 09/07/17. Wife states that they were instructed to contact Dr. Lovena Le to see if a sooner appointment was needed. Reviewed notes in Care Everywhere and discharge instructions were for the patient to follow up with cardiology in 4-6 weeks. Patient is not having any chest pain, SOB, dizziness, lightheadedness, syncope, or any other cardiac symptoms at this time. Made the wife aware that I would forward the information to Dr. Lovena Le and his RN for review to determine if patient needs to be seen prior to scheduled appointment on 12/09/17. Instructed for the patient to call back if his condition changes or if he develops any symptoms. Wife verbalized understanding and thanked me for the call.

## 2017-10-19 NOTE — Telephone Encounter (Signed)
New Message  Wife calling, states that pt had surgery at [facility] on 4/26 to stop blood clots as a result of radiation cystitis, during procedure anesthesiologist, the pt had mild heart attack, f/u labs showed increase in enzymes echo and ekg did not support heart attack concerns, but he was released sat 4/27 with request that he f/u with Dr. Lovena Le asap sooner than his 6/21 appt.  Please call

## 2017-10-20 ENCOUNTER — Other Ambulatory Visit: Payer: Self-pay | Admitting: *Deleted

## 2017-10-20 DIAGNOSIS — D519 Vitamin B12 deficiency anemia, unspecified: Secondary | ICD-10-CM | POA: Diagnosis not present

## 2017-10-20 DIAGNOSIS — D518 Other vitamin B12 deficiency anemias: Secondary | ICD-10-CM | POA: Diagnosis not present

## 2017-10-20 DIAGNOSIS — I10 Essential (primary) hypertension: Secondary | ICD-10-CM | POA: Diagnosis not present

## 2017-10-20 DIAGNOSIS — Z125 Encounter for screening for malignant neoplasm of prostate: Secondary | ICD-10-CM | POA: Diagnosis not present

## 2017-10-20 DIAGNOSIS — R82998 Other abnormal findings in urine: Secondary | ICD-10-CM | POA: Diagnosis not present

## 2017-10-20 DIAGNOSIS — E559 Vitamin D deficiency, unspecified: Secondary | ICD-10-CM | POA: Diagnosis not present

## 2017-10-20 DIAGNOSIS — E7800 Pure hypercholesterolemia, unspecified: Secondary | ICD-10-CM | POA: Diagnosis not present

## 2017-10-20 NOTE — Patient Outreach (Signed)
[facility]) Care Management  10/20/2017  Christopher Baker 1933-08-20 [id number]  Referral Received . No outreach warranted at this time. TOC will be completed by primary care provider office who will refer to [facility] care management if needed.   Plan  Will mark case as not active, closed.  Patient enrolled in another program [facility] program by PCP office).   Joylene Draft, RN, [place] Management Coordinator  [phone]- Mobile [phone]- Toll Free Main Office

## 2017-10-21 ENCOUNTER — Ambulatory Visit: Payer: PPO | Admitting: Psychology

## 2017-10-23 NOTE — Telephone Encounter (Signed)
We will discuss AS on followup. GT

## 2017-10-24 NOTE — Telephone Encounter (Signed)
Returned call to Pt wife.  Notified that Dr. Lovena Le will discuss Pt valve issue at already scheduled f/u.  Wife indicates understanding.  States other than his urological issues Pt is doing well.   Advised to call if needed.  Wife indicates understanding.

## 2017-10-27 ENCOUNTER — Other Ambulatory Visit: Payer: Self-pay | Admitting: Internal Medicine

## 2017-10-27 ENCOUNTER — Other Ambulatory Visit: Payer: Self-pay | Admitting: Neurology

## 2017-10-27 MED ORDER — DOFETILIDE 500 MCG PO CAPS: 500.0000 ug | ORAL_CAPSULE | Freq: Two times a day (BID) | ORAL | 3 refills | Status: DC

## 2017-10-29 ENCOUNTER — Inpatient Hospital Stay (HOSPITAL_COMMUNITY)
Admission: EM | Admit: 2017-10-29 | Discharge: 2017-11-03 | DRG: 872 | Disposition: A | Discharge status: Home Health | Payer: PPO | Attending: Internal Medicine | Admitting: Internal Medicine

## 2017-10-29 ENCOUNTER — Emergency Department (HOSPITAL_COMMUNITY): Payer: PPO

## 2017-10-29 DIAGNOSIS — L4050 Arthropathic psoriasis, unspecified: Secondary | ICD-10-CM | POA: Diagnosis present

## 2017-10-29 DIAGNOSIS — I110 Hypertensive heart disease with heart failure: Secondary | ICD-10-CM | POA: Diagnosis present

## 2017-10-29 DIAGNOSIS — Z9841 Cataract extraction status, right eye: Secondary | ICD-10-CM | POA: Diagnosis not present

## 2017-10-29 DIAGNOSIS — Z8546 Personal history of malignant neoplasm of prostate: Secondary | ICD-10-CM | POA: Diagnosis not present

## 2017-10-29 DIAGNOSIS — F0150 Vascular dementia without behavioral disturbance: Secondary | ICD-10-CM | POA: Diagnosis not present

## 2017-10-29 DIAGNOSIS — Z9842 Cataract extraction status, left eye: Secondary | ICD-10-CM

## 2017-10-29 DIAGNOSIS — R030 Elevated blood-pressure reading, without diagnosis of hypertension: Secondary | ICD-10-CM | POA: Diagnosis not present

## 2017-10-29 DIAGNOSIS — Z7901 Long term (current) use of anticoagulants: Secondary | ICD-10-CM

## 2017-10-29 DIAGNOSIS — G2581 Restless legs syndrome: Secondary | ICD-10-CM | POA: Diagnosis present

## 2017-10-29 DIAGNOSIS — Z96652 Presence of left artificial knee joint: Secondary | ICD-10-CM | POA: Diagnosis present

## 2017-10-29 DIAGNOSIS — F419 Anxiety disorder, unspecified: Secondary | ICD-10-CM | POA: Diagnosis present

## 2017-10-29 DIAGNOSIS — M545 Low back pain: Secondary | ICD-10-CM | POA: Diagnosis present

## 2017-10-29 DIAGNOSIS — K219 Gastro-esophageal reflux disease without esophagitis: Secondary | ICD-10-CM | POA: Diagnosis not present

## 2017-10-29 DIAGNOSIS — Z91013 Allergy to seafood: Secondary | ICD-10-CM

## 2017-10-29 DIAGNOSIS — R509 Fever, unspecified: Secondary | ICD-10-CM | POA: Diagnosis not present

## 2017-10-29 DIAGNOSIS — G8929 Other chronic pain: Secondary | ICD-10-CM | POA: Diagnosis not present

## 2017-10-29 DIAGNOSIS — I482 Chronic atrial fibrillation, unspecified: Secondary | ICD-10-CM | POA: Diagnosis present

## 2017-10-29 DIAGNOSIS — R338 Other retention of urine: Secondary | ICD-10-CM | POA: Diagnosis not present

## 2017-10-29 DIAGNOSIS — N390 Urinary tract infection, site not specified: Secondary | ICD-10-CM | POA: Diagnosis not present

## 2017-10-29 DIAGNOSIS — Z950 Presence of cardiac pacemaker: Secondary | ICD-10-CM

## 2017-10-29 DIAGNOSIS — I69311 Memory deficit following cerebral infarction: Secondary | ICD-10-CM

## 2017-10-29 DIAGNOSIS — A419 Sepsis, unspecified organism: Secondary | ICD-10-CM | POA: Diagnosis present

## 2017-10-29 DIAGNOSIS — E785 Hyperlipidemia, unspecified: Secondary | ICD-10-CM | POA: Diagnosis not present

## 2017-10-29 DIAGNOSIS — Z885 Allergy status to narcotic agent status: Secondary | ICD-10-CM

## 2017-10-29 DIAGNOSIS — I4581 Long QT syndrome: Secondary | ICD-10-CM | POA: Diagnosis present

## 2017-10-29 DIAGNOSIS — R310 Gross hematuria: Secondary | ICD-10-CM | POA: Diagnosis present

## 2017-10-29 DIAGNOSIS — G4733 Obstructive sleep apnea (adult) (pediatric): Secondary | ICD-10-CM | POA: Diagnosis present

## 2017-10-29 DIAGNOSIS — I5022 Chronic systolic (congestive) heart failure: Secondary | ICD-10-CM | POA: Diagnosis not present

## 2017-10-29 DIAGNOSIS — Z96619 Presence of unspecified artificial shoulder joint: Secondary | ICD-10-CM | POA: Diagnosis present

## 2017-10-29 DIAGNOSIS — Z87891 Personal history of nicotine dependence: Secondary | ICD-10-CM

## 2017-10-29 DIAGNOSIS — F329 Major depressive disorder, single episode, unspecified: Secondary | ICD-10-CM | POA: Diagnosis not present

## 2017-10-29 DIAGNOSIS — A4151 Sepsis due to Escherichia coli [E. coli]: Secondary | ICD-10-CM | POA: Diagnosis not present

## 2017-10-29 DIAGNOSIS — I472 Ventricular tachycardia: Secondary | ICD-10-CM | POA: Diagnosis not present

## 2017-10-29 DIAGNOSIS — D649 Anemia, unspecified: Secondary | ICD-10-CM | POA: Diagnosis not present

## 2017-10-29 DIAGNOSIS — Z961 Presence of intraocular lens: Secondary | ICD-10-CM | POA: Diagnosis not present

## 2017-10-29 DIAGNOSIS — Z888 Allergy status to other drugs, medicaments and biological substances status: Secondary | ICD-10-CM

## 2017-10-29 DIAGNOSIS — Z7952 Long term (current) use of systemic steroids: Secondary | ICD-10-CM

## 2017-10-29 DIAGNOSIS — R7881 Bacteremia: Secondary | ICD-10-CM | POA: Diagnosis not present

## 2017-10-29 DIAGNOSIS — Z8673 Personal history of transient ischemic attack (TIA), and cerebral infarction without residual deficits: Secondary | ICD-10-CM

## 2017-10-29 DIAGNOSIS — R4182 Altered mental status, unspecified: Secondary | ICD-10-CM | POA: Diagnosis not present

## 2017-10-29 DIAGNOSIS — Z79899 Other long term (current) drug therapy: Secondary | ICD-10-CM

## 2017-10-29 DIAGNOSIS — I10 Essential (primary) hypertension: Secondary | ICD-10-CM | POA: Diagnosis not present

## 2017-10-29 DIAGNOSIS — I4891 Unspecified atrial fibrillation: Secondary | ICD-10-CM | POA: Diagnosis not present

## 2017-10-29 LAB — CBC
HCT: 31 % — ABNORMAL LOW (ref 39.0–52.0)
Hemoglobin: 9.5 g/dL — ABNORMAL LOW (ref 13.0–17.0)
MCH: 28.6 pg (ref 26.0–34.0)
MCHC: 30.6 g/dL (ref 30.0–36.0)
MCV: 93.4 fL (ref 78.0–100.0)
Platelets: 265 10*3/uL (ref 150–400)
RBC: 3.32 MIL/uL — AB (ref 4.22–5.81)
RDW: 15.7 % — AB (ref 11.5–15.5)
WBC: 16.4 10*3/uL — AB (ref 4.0–10.5)

## 2017-10-29 LAB — COMPREHENSIVE METABOLIC PANEL
ALK PHOS: 66 U/L (ref 38–126)
ALT: 23 U/L (ref 17–63)
ANION GAP: 11 (ref 5–15)
AST: 20 U/L (ref 15–41)
Albumin: 2.9 g/dL — ABNORMAL LOW (ref 3.5–5.0)
BUN: 24 mg/dL — ABNORMAL HIGH (ref 6–20)
CALCIUM: 9.4 mg/dL (ref 8.9–10.3)
CO2: 23 mmol/L (ref 22–32)
CREATININE: 1.22 mg/dL (ref 0.61–1.24)
Chloride: 105 mmol/L (ref 101–111)
GFR calc non Af Amer: 53 mL/min — ABNORMAL LOW (ref >60)
Glucose, Bld: 129 mg/dL — ABNORMAL HIGH (ref 65–99)
Potassium: 3.7 mmol/L (ref 3.5–5.1)
Sodium: 139 mmol/L (ref 135–145)
Total Bilirubin: 0.6 mg/dL (ref 0.3–1.2)
Total Protein: 5.7 g/dL — ABNORMAL LOW (ref 6.5–8.1)

## 2017-10-29 LAB — I-STAT CG4 LACTIC ACID, ED: LACTIC ACID, VENOUS: 2.55 mmol/L — AB (ref 0.5–1.9)

## 2017-10-29 MED ORDER — VANCOMYCIN HCL IN DEXTROSE 1-5 GM/200ML-% IV SOLN
1000.0000 mg | Freq: Once | INTRAVENOUS | Status: DC
  Filled 2017-10-29: qty 200

## 2017-10-29 MED ORDER — PIPERACILLIN-TAZOBACTAM 3.375 G IVPB 30 MIN
3.3750 g | Freq: Once | INTRAVENOUS | Status: AC
  Administered 2017-10-29: 3.375 g via INTRAVENOUS
  Filled 2017-10-29: qty 50

## 2017-10-29 MED ORDER — SODIUM CHLORIDE 0.9 % IV BOLUS (SEPSIS)
1000.0000 mL | Freq: Once | INTRAVENOUS | Status: AC
  Administered 2017-10-29: 1000 mL via INTRAVENOUS

## 2017-10-29 MED ORDER — SODIUM CHLORIDE 0.9 % IV BOLUS (SEPSIS)
1000.0000 mL | Freq: Once | INTRAVENOUS | Status: AC
  Administered 2017-10-30: 1000 mL via INTRAVENOUS

## 2017-10-29 MED ORDER — VANCOMYCIN HCL 10 G IV SOLR
1500.0000 mg | Freq: Once | INTRAVENOUS | Status: AC
  Administered 2017-10-29: 1500 mg via INTRAVENOUS
  Filled 2017-10-29: qty 1500

## 2017-10-29 NOTE — ED Notes (Signed)
Nurse will draw labs. 

## 2017-10-29 NOTE — ED Notes (Signed)
MD notified (Resident) of altered mental status and left sided droop reported from family. No droop or deficit on triage

## 2017-10-29 NOTE — ED Notes (Signed)
Informed MD of critical lactic result.

## 2017-10-29 NOTE — ED Triage Notes (Signed)
Pt BIB [facility] for fever and altered mental status. Wife reports around [place] patient was walking around the house naked and making incomprehensible speech with a left sided facial droop. Pt has a history of CVA. [facility] was [facility] for a bladder procedure. 1000mg  of tylenol given pta for 102 temporal temperature

## 2017-10-29 NOTE — ED Provider Notes (Addendum)
[facility] EMERGENCY DEPARTMENT Provider Note   CSN: [id number] Arrival date & time: 10/29/17  2120     History   Chief Complaint Chief Complaint  Patient presents with  . Fever  . Altered Mental Status    HPI Christopher Baker is a 84 y.o. male. Level 5 caveat secondary to confusion versus dementia HPI  84 year old man presents today with fever.  Per nursing note the patient was brought in via [facility] for fever and altered mental status.  Nursing note states wife reports around [place] patient was walking around the house naked and making incomprehensible speech with a left-sided facial droop.  Patient has a history of CVA.  Patient is reportedly at [facility] last week for a bladder procedure.  He was given 1 g of Tylenol prior to arrival for a temperature of 102 degrees via temporal thermometer Wife states that patient had bladder irrigation and cauterization on April 26 at [facility].  She states that since that time he has continued to have some difficulty voiding.  Today he complained of not being able to void.  And he became acutely confused.  He also states that he may have had some facial droop at that time. Past Medical History:  Diagnosis Date  . Anxiety   . Arthritis    "hands, right hip; lower back" (02/28/2017)  . Arthritis with psoriasis ([place])   . Borderline diabetes    DIET CONTROLLED - PT STATES HE IS NOT DIABETIC  . Carotid stenosis, bilateral PER DR EARLY / DUPLEX  09-09-10     40 - 59%   BILATERALLY--- ASYMPTOMATIC  . Chronic lower back pain   . Depression   . Dysrhythmia    a-fib  . GERD (gastroesophageal reflux disease)    CONTROLLED W/ PROTONIX  . History of gout   . HTN (hypertension)   . Hyperlipidemia   . Iron deficiency   . Lumbar spondylosis W/ RADICULOPATHY  . OSA on CPAP   . PAF (paroxysmal atrial fibrillation) ([place])   . Presence of permanent cardiac pacemaker DDD-  06-04-10-   DDD; SECONDARY TO SYNCOPY AND  BRADYCARDIA  . Prostate cancer ([place]) 2008   S/P 38 RADIATION  [place]    . Restless leg syndrome   . Stroke [facility]) Oct. 14, 2014   light left hand, balance issues, short term memory loss 2014  . SVT (supraventricular tachycardia) ([place])    S/P ABLATION   2008    Patient Active Problem List   Diagnosis Date Noted  . Chronic systolic heart failure ([place]) 09/07/2017  . Visit for monitoring Tikosyn therapy 02/28/2017  . S/P shoulder replacement 06/27/2015  . Residual cognitive deficit as late effect of stroke 04/15/2015  . Lung nodule   . Abnormal CXR 12/12/2014  . GI bleed 12/11/2014  . Weakness generalized 12/11/2014  . Leukocytosis 12/11/2014  . Restless leg syndrome 12/11/2014  . Anemia 12/11/2014  . Abnormal EKG 12/11/2014  . Gait disturbance, post-stroke 10/22/2014  . TIA (transient ischemic attack) 06/18/2014  . Partial seizures ([place]) 06/18/2014  . Seizure ([place]) 06/18/2014  . Vitamin B12 deficiency 06/18/2014  . OSA (obstructive sleep apnea) 02/26/2014  . Cognitive deficits as late effect of cerebrovascular disease 01/11/2014  . Fatigue 09/20/2013  . Flu-like symptoms 06/19/2013  . Intermittent lightheadedness 06/19/2013  . Low blood pressure, not hypotension 06/19/2013  . Influenza with respiratory manifestations 06/19/2013  . Embolic cerebral infarction ([place]) 04/09/2013  . CVA (cerebral infarction) 04/03/2013  .  Occlusion and stenosis of carotid artery without mention of cerebral infarction 11/21/2012  . OA (osteoarthritis) of knee 12/20/2011  . Methotrexate, long term, current use 09/10/2011  . Knee pain, left 07/27/2011  . Pacemaker-Medtronic 09/08/2010  . UNSPECIFIED VISUAL DISTURBANCE 07/20/2010  . SYNCOPE 06/01/2010  . IRON DEFIC ANEMIA [place] DIET IRON INTAKE 04/20/2010  . PSORIASIS 04/20/2010  . HEADACHE, PERSISTENT 01/13/2010  . HEMATURIA UNSPECIFIED 11/19/2009  . PHLEBITIS AND THROMBOPHLEBITIS OF OTHER SITE 07/02/2009  . CELLULITIS, LEG, LEFT 07/02/2009  . DISUSE  OSTEOPOROSIS 05/12/2009  . HYPERGLYCEMIA, BORDERLINE 01/10/2009  . SPONDYLOSIS, LUMBAR, WITH RADICULOPATHY 10/15/2008  . OTHER SPECIFIED DERMATOMYCOSES 06/24/2008  . EXTRINSIC ASTHMA, WITH EXACERBATION 05/01/2008  . CALCU GALLBLADD W/OTH CHOLECYSTITIS&OBSTRUCTION 02/20/2008  . VARICOSE VEINS LOWER EXTREMITIES W/INFLAMMATION 12/25/2007  . EDEMA 12/25/2007  . INSOMNIA WITH SLEEP APNEA UNSPECIFIED 08/31/2007  . GERD 03/23/2007  . FIBRILLATION, ATRIAL 01/31/2007  . PSORIATIC ARTHRITIS 01/31/2007  . ABDOMINAL BLOATING 01/31/2007  . Hyperlipidemia 01/19/2007  . Essential hypertension 01/19/2007  . PROSTATE CANCER, HX OF 01/19/2007    Past Surgical History:  Procedure Laterality Date  . BACK SURGERY    . BIV UPGRADE N/A 09/07/2017   Procedure: BIV PACEMAKER UPGRADE;  Surgeon: Evans Lance, MD;  Location: [place] CV LAB;  Service: Cardiovascular;  Laterality: N/A;  . BREAST SURGERY     "removed tumor; don't remember which side; years ago" (02/28/2017)  . CARDIAC ELECTROPHYSIOLOGY STUDY AND ABLATION  2008   FOR SVT  . CARDIAC PACEMAKER PLACEMENT  06-04-10   DDD  . CARDIOVERSION N/A 03/02/2017   Procedure: CARDIOVERSION;  Surgeon: Josue Hector, MD;  Location: [facility] ENDOSCOPY;  Service: Cardiovascular;  Laterality: N/A;  . CATARACT EXTRACTION W/ INTRAOCULAR LENS  IMPLANT, BILATERAL Bilateral   . CHOLECYSTECTOMY  2009  . ESOPHAGOGASTRODUODENOSCOPY N/A 12/12/2014   Procedure: ESOPHAGOGASTRODUODENOSCOPY (EGD);  Surgeon: Wilford Corner, MD;  Location: [facility]COPY;  Service: Endoscopy;  Laterality: N/A;  . INCISION AND DRAINAGE Right 1997   "knee replacement got infected"  . INGUINAL HERNIA REPAIR Left 06/2003    DONE WITH PENILE PROSTESIS SURG.  . INGUINAL HERNIA REPAIR Right 1962  . JOINT REPLACEMENT    . KNEE ARTHROSCOPY  06/02/2011   Procedure: ARTHROSCOPY KNEE;  Surgeon: Gearlean Alf;  Location: [place];  Service: Orthopedics;  Laterality: Left;  LEFT KNEE  ARTHROSCOPY WITH DEBRIDEMENT  . LOOP RECORDER PLACEMENT  01-30-10  . LOOP RECORDER REMOVAL  06/04/2010  . LUMBAR LAMINECTOMY/ DISKECTOMY/ FUSION  05-19-10   L4 - 5  . PENILE PROSTHESIS IMPLANT  06/2003   AND LEFT CORPOROPLASTY /    DONE INGUINAL REPAIR  . PROSTATE BIOPSY  2007  . REVISION TOTAL KNEE ARTHROPLASTY Right 1997  . TOTAL KNEE ARTHROPLASTY  12/20/2011   Procedure: TOTAL KNEE ARTHROPLASTY;  Surgeon: Gearlean Alf, MD;  Location: [facility];  Service: Orthopedics;  Laterality: Left;  . TOTAL KNEE ARTHROPLASTY Right 1997  . TOTAL SHOULDER ARTHROPLASTY Right 06/27/2015   Procedure: REVERSE TOTAL SHOULDER ARTHROPLASTY;  Surgeon: Netta Cedars, MD;  Location: [place];  Service: Orthopedics;  Laterality: Right;  . TRANSURETHRAL RESECTION OF BLADDER TUMOR WITH GYRUS (TURBT-GYRUS)  ?2018  . TRANSURETHRAL RESECTION OF PROSTATE  2006        Home Medications    Prior to Admission medications   Medication Sig Start Date End Date Taking? Authorizing Provider  acetaminophen (TYLENOL) 500 MG tablet Take 1,000 mg by mouth 2 (two) times daily.    [provider]  amoxicillin (AMOXIL) 500 MG capsule Take 2,000 mg by mouth See admin instructions. Take 2000 mg by mouth one hour prior to dental apointments 09/02/16   [provider]  Artificial Tear Ointment (LUBRICANT EYE OP) Place 2 drops into both eyes daily as needed (for dry eyes).    [provider]  atorvastatin (LIPITOR) 20 MG tablet Take 20 mg by mouth daily.    [provider]  cyanocobalamin (,VITAMIN B-12,) 1000 MCG/ML injection Inject 1,000 mcg into the muscle every 30 (thirty) days.     [provider]  dofetilide (TIKOSYN) 500 MCG capsule Take 1 capsule (500 mcg total) by mouth 2 (two) times daily. 10/27/17   Evans Lance, MD  donepezil (ARICEPT) 10 MG tablet TAKE ONE TABLET BY MOUTH EVERY NIGHT AT BEDTIME 10/27/17   Tomi Likens, Adam R, DO  DULoxetine (CYMBALTA) 30 MG capsule Take 1 capsule (30 mg  total) by mouth daily. Patient taking differently: Take 30 mg by mouth at bedtime.  03/04/17   Baldwin Jamaica, PA-C  ELIQUIS 5 MG TABS tablet TAKE 1 TABLET (5 MG TOTAL) BY MOUTH 2 (TWO) TIMES DAILY. 06/27/17   Evans Lance, MD  ezetimibe (ZETIA) 10 MG tablet Take 1 tablet (10 mg total) by mouth daily. 03/28/15   Dutch Quint B, FNP  ferrous sulfate 325 (65 FE) MG tablet Take 325 mg by mouth 2 (two) times daily with a meal.     [provider]  folic acid (FOLVITE) 1 MG tablet Take 1 mg by mouth at bedtime.     [provider]  furosemide (LASIX) 40 MG tablet Take 80 mg by mouth daily.  02/11/17   [provider]  lisinopril (PRINIVIL,ZESTRIL) 5 MG tablet TAKE ONE TABLET BY MOUTH DAILY Patient taking differently: Take 10 mg by mouth at bedtime 11/26/16   Evans Lance, MD  loperamide (IMODIUM A-D) 2 MG tablet Take 2 mg by mouth 3 (three) times daily as needed for diarrhea or loose stools.     [provider]  methotrexate (RHEUMATREX) 2.5 MG tablet Take 15 mg by mouth every Monday.  10/02/11   [provider]  metoprolol succinate (TOPROL XL) 25 MG 24 hr tablet Take 1 tablet (25 mg total) by mouth daily. Patient taking differently: Take 25 mg by mouth at bedtime.  06/20/17   Evans Lance, MD  Misc. Devices (STEP N REST WALKER) MISC Use as directed Patient not taking: Reported on 08/31/2017 04/23/16   Pieter Partridge, DO  oxybutynin (DITROPAN) 5 MG tablet Take 5 mg by mouth 2 (two) times daily.  11/04/16   [provider]  oxyCODONE-acetaminophen (PERCOCET) 10-325 MG tablet Take 0.25-0.5 tablets by mouth 2 (two) times daily as needed for pain.  07/28/17   [provider]  pantoprazole (PROTONIX) 40 MG tablet Take 1 tablet (40 mg total) by mouth daily. Patient taking differently: Take 40 mg by mouth at bedtime.  10/22/15   Dutch Quint B, FNP  predniSONE (DELTASONE) 5 MG tablet Take 5 mg by mouth daily.    [provider]  Vitamin  D, Ergocalciferol, (DRISDOL) 50000 units CAPS capsule Take 50,000 Units by mouth every Sunday.     [provider]    Family History Family History  Problem Relation Age of Onset  . Stroke Mother   . Early death Father     Social History Social History   Tobacco Use  . Smoking status: Former Smoker  Packs/day: 1.00    Years: 45.00    Pack years: 45.00    Types: Cigarettes    Last attempt to quit: 12/07/1998    Years since quitting: 18.9  . Smokeless tobacco: Never Used  Substance Use Topics  . Alcohol use: Yes    Comment: 1 cocktail per evening  . Drug use: No     Allergies   Ciprofloxacin; Hydrocodone-acetaminophen; Other; Shellfish allergy; and Tramadol   Review of Systems Review of Systems  Unable to perform ROS: Mental status change     Physical Exam Updated Vital Signs BP (!) 143/43 (BP Location: Right Arm)   Pulse 85   Temp (!) 102.1 F (38.9 C) (Rectal)   Resp 20   SpO2 98%   Physical Exam  Constitutional: He appears well-developed and well-nourished. No distress.  HENT:  Head: Normocephalic and atraumatic.  Right Ear: External ear normal.  Left Ear: External ear normal.  Mucous membranes appear somewhat dry  Eyes: Pupils are equal, round, and reactive to light.  Neck: Normal range of motion.  Cardiovascular: Normal rate and normal pulses. An irregularly irregular rhythm present.  Murmur heard.  Systolic murmur is present with a grade of 2/6. Pulmonary/Chest: Effort normal and breath sounds normal.  Abdominal: Soft. Normal appearance and bowel sounds are normal. There is no tenderness.  Genitourinary: Penis normal.  Neurological: He is alert.  Oriented to person and place  Skin: Skin is warm. Capillary refill takes less than 2 seconds.  Psychiatric: He has a normal mood and affect.  Nursing note and vitals reviewed.    ED Treatments / Results  Labs (all labs ordered are listed, but only abnormal results are displayed) Labs  Reviewed  CULTURE, BLOOD (ROUTINE X 2)  CULTURE, BLOOD (ROUTINE X 2)  COMPREHENSIVE METABOLIC PANEL  CBC  URINALYSIS, ROUTINE W REFLEX MICROSCOPIC  CBG MONITORING, ED  I-STAT CG4 LACTIC ACID, ED    EKG EKG Interpretation  Date/Time:  Saturday Oct 29 2017 21:48:27 EDT Ventricular Rate:  85 PR Interval:    QRS Duration: 135 QT Interval:  404 QTC Calculation: 466 R Axis:   -68 Text Interpretation:  Atrial fibrillation Left bundle branch block Baseline wander in lead(s) II  Poor data quality, interpretation may be adversely affected ?[place] spikes Confirmed by Pattricia Boss [phone]) on 10/29/2017 11:29:10 PM   Radiology Dg Chest Port 1 View  Result Date: 10/29/2017 CLINICAL DATA:  Fever and altered mental status EXAM: PORTABLE CHEST 1 VIEW COMPARISON:  09/08/2017 FINDINGS: Cardiac shadow is within normal limits. Pacing device is again seen and stable. The lungs are well aerated bilaterally. No focal infiltrate or sizable effusion is seen. No acute bony abnormality is noted. Postsurgical changes in the proximal right shoulder are noted. IMPRESSION: No acute abnormality seen. Electronically Signed   By: Inez Catalina M.D.   On: 10/29/2017 22:11    Procedures Procedures (including critical care time)  Medications Ordered in ED Medications - No data to display   Initial Impression / Assessment and Plan / ED Course  I have reviewed the triage vital signs and the nursing notes.  Pertinent labs & imaging results that were available during my care of the patient were reviewed by me and considered in my medical decision making (see chart for details). Fever-likely urinary source  Patient with elevated lactic acid.  No tachycardia-patient with beta blocker and is pacemaker dependent per wife Fluid bolus and abx given  Vitals:   10/29/17 2133 10/29/17 2207  BP: Marland Kitchen)  143/43 (!) 94/52  Pulse: 85 74  Resp: 20 13  Temp: (!) 102.1 F (38.9 C)   SpO2: 98% 95%    Patient with IV fluids  being given.  Blood pressure 124/62.  Patient awake and alert.  Good capillary refill.  Radial pulses intact bilaterally.  Plan admission to hospitalist service for further treatment.  CRITICAL CARE Performed by: Pattricia Boss Total critical care time: 60 minutes Critical care time was exclusive of separately billable procedures and treating other patients. Critical care was necessary to treat or prevent imminent or life-threatening deterioration. Critical care was time spent personally by me on the following activities: development of treatment plan with patient and/or surrogate as well as nursing, discussions with consultants, evaluation of patient's response to treatment, examination of patient, obtaining history from patient or surrogate, ordering and performing treatments and interventions, ordering and review of laboratory studies, ordering and review of radiographic studies, pulse oximetry and re-evaluation of patient's condition.   Discussed with Dr. Myna Hidalgo and he will see for admission Final Clinical Impressions(s) / ED Diagnoses   Final diagnoses:  Sepsis, due to unspecified organism [facility])  Anemia, unspecified type    ED Discharge Orders    None       Pattricia Boss, MD 10/29/17 2358    Pattricia Boss, MD 10/30/17 [phone]

## 2017-10-29 NOTE — ED Provider Notes (Signed)
  Physical Exam  BP (!) 94/52   Pulse 74   Temp (!) 102.1 F (38.9 C) (Rectal)   Resp 13   SpO2 95%   Physical Exam  ED Course/Procedures     Procedures  MDM   Assuming care of patient from Dr. Jeanell Sparrow.   Patient in the ED for AMS. Pt also has a left sided facial droop. Pt was noted to be febrile. He has pacer dependent dysrhythmia and is on eliquis and recent prostate procedure. Concerns for UTI. Prostate exam was normal. Workup thus far shows elevated white count, elevated lactic acid.  Patient's BP on the last check was 94/50 with a map of 67.  Important pending results are none.  According to Dr. Jeanell Sparrow, plan is to admit the patient for severe sepsis. Pt has received 30 cc/kg of fluid, broad spectrum antibiotics.  Patient had no complains, no concerns from the nursing side. Will continue to monitor.       Christopher Biles, MD 10/29/17 2350

## 2017-10-30 ENCOUNTER — Encounter (HOSPITAL_COMMUNITY): Payer: Self-pay | Admitting: Family Medicine

## 2017-10-30 ENCOUNTER — Other Ambulatory Visit: Payer: Self-pay

## 2017-10-30 DIAGNOSIS — I69311 Memory deficit following cerebral infarction: Secondary | ICD-10-CM | POA: Diagnosis not present

## 2017-10-30 DIAGNOSIS — M545 Low back pain: Secondary | ICD-10-CM | POA: Diagnosis present

## 2017-10-30 DIAGNOSIS — R338 Other retention of urine: Secondary | ICD-10-CM | POA: Diagnosis not present

## 2017-10-30 DIAGNOSIS — G8929 Other chronic pain: Secondary | ICD-10-CM | POA: Diagnosis present

## 2017-10-30 DIAGNOSIS — F329 Major depressive disorder, single episode, unspecified: Secondary | ICD-10-CM | POA: Diagnosis present

## 2017-10-30 DIAGNOSIS — A419 Sepsis, unspecified organism: Secondary | ICD-10-CM | POA: Diagnosis present

## 2017-10-30 DIAGNOSIS — R310 Gross hematuria: Secondary | ICD-10-CM | POA: Diagnosis present

## 2017-10-30 DIAGNOSIS — A4151 Sepsis due to Escherichia coli [E. coli]: Secondary | ICD-10-CM | POA: Diagnosis present

## 2017-10-30 DIAGNOSIS — G2581 Restless legs syndrome: Secondary | ICD-10-CM | POA: Diagnosis present

## 2017-10-30 DIAGNOSIS — E785 Hyperlipidemia, unspecified: Secondary | ICD-10-CM | POA: Diagnosis present

## 2017-10-30 DIAGNOSIS — I482 Chronic atrial fibrillation: Secondary | ICD-10-CM | POA: Diagnosis present

## 2017-10-30 DIAGNOSIS — I472 Ventricular tachycardia: Secondary | ICD-10-CM | POA: Diagnosis present

## 2017-10-30 DIAGNOSIS — I4581 Long QT syndrome: Secondary | ICD-10-CM | POA: Diagnosis present

## 2017-10-30 DIAGNOSIS — R7881 Bacteremia: Secondary | ICD-10-CM | POA: Diagnosis not present

## 2017-10-30 DIAGNOSIS — Z96652 Presence of left artificial knee joint: Secondary | ICD-10-CM | POA: Diagnosis present

## 2017-10-30 DIAGNOSIS — Z8673 Personal history of transient ischemic attack (TIA), and cerebral infarction without residual deficits: Secondary | ICD-10-CM | POA: Diagnosis not present

## 2017-10-30 DIAGNOSIS — Z96619 Presence of unspecified artificial shoulder joint: Secondary | ICD-10-CM | POA: Diagnosis present

## 2017-10-30 DIAGNOSIS — L4050 Arthropathic psoriasis, unspecified: Secondary | ICD-10-CM

## 2017-10-30 DIAGNOSIS — I10 Essential (primary) hypertension: Secondary | ICD-10-CM | POA: Diagnosis not present

## 2017-10-30 DIAGNOSIS — F0150 Vascular dementia without behavioral disturbance: Secondary | ICD-10-CM | POA: Diagnosis not present

## 2017-10-30 DIAGNOSIS — K219 Gastro-esophageal reflux disease without esophagitis: Secondary | ICD-10-CM | POA: Diagnosis present

## 2017-10-30 DIAGNOSIS — Z961 Presence of intraocular lens: Secondary | ICD-10-CM | POA: Diagnosis present

## 2017-10-30 DIAGNOSIS — D649 Anemia, unspecified: Secondary | ICD-10-CM | POA: Diagnosis present

## 2017-10-30 DIAGNOSIS — I5022 Chronic systolic (congestive) heart failure: Secondary | ICD-10-CM | POA: Diagnosis present

## 2017-10-30 DIAGNOSIS — Z9841 Cataract extraction status, right eye: Secondary | ICD-10-CM | POA: Diagnosis not present

## 2017-10-30 DIAGNOSIS — N390 Urinary tract infection, site not specified: Secondary | ICD-10-CM | POA: Diagnosis not present

## 2017-10-30 DIAGNOSIS — G4733 Obstructive sleep apnea (adult) (pediatric): Secondary | ICD-10-CM | POA: Diagnosis present

## 2017-10-30 DIAGNOSIS — Z8546 Personal history of malignant neoplasm of prostate: Secondary | ICD-10-CM | POA: Diagnosis not present

## 2017-10-30 DIAGNOSIS — F419 Anxiety disorder, unspecified: Secondary | ICD-10-CM | POA: Diagnosis present

## 2017-10-30 DIAGNOSIS — I110 Hypertensive heart disease with heart failure: Secondary | ICD-10-CM | POA: Diagnosis present

## 2017-10-30 DIAGNOSIS — Z950 Presence of cardiac pacemaker: Secondary | ICD-10-CM | POA: Diagnosis not present

## 2017-10-30 LAB — BLOOD CULTURE ID PANEL (REFLEXED)
Acinetobacter baumannii: NOT DETECTED
CANDIDA ALBICANS: NOT DETECTED
Candida glabrata: NOT DETECTED
Candida krusei: NOT DETECTED
Candida parapsilosis: NOT DETECTED
Candida tropicalis: NOT DETECTED
Carbapenem resistance: NOT DETECTED
ENTEROBACTER CLOACAE COMPLEX: NOT DETECTED
ENTEROBACTERIACEAE SPECIES: DETECTED — AB
Enterococcus species: NOT DETECTED
Escherichia coli: DETECTED — AB
Haemophilus influenzae: NOT DETECTED
Klebsiella oxytoca: NOT DETECTED
Klebsiella pneumoniae: NOT DETECTED
LISTERIA MONOCYTOGENES: NOT DETECTED
NEISSERIA MENINGITIDIS: NOT DETECTED
PSEUDOMONAS AERUGINOSA: NOT DETECTED
Proteus species: NOT DETECTED
STREPTOCOCCUS AGALACTIAE: NOT DETECTED
STREPTOCOCCUS PNEUMONIAE: NOT DETECTED
STREPTOCOCCUS PYOGENES: NOT DETECTED
STREPTOCOCCUS SPECIES: NOT DETECTED
Serratia marcescens: NOT DETECTED
Staphylococcus aureus (BCID): NOT DETECTED
Staphylococcus species: NOT DETECTED

## 2017-10-30 LAB — BASIC METABOLIC PANEL
Anion gap: 8 (ref 5–15)
BUN: 20 mg/dL (ref 6–20)
CALCIUM: 8.7 mg/dL — AB (ref 8.9–10.3)
CO2: 21 mmol/L — ABNORMAL LOW (ref 22–32)
Chloride: 113 mmol/L — ABNORMAL HIGH (ref 101–111)
Creatinine, Ser: 1.14 mg/dL (ref 0.61–1.24)
GFR calc non Af Amer: 58 mL/min — ABNORMAL LOW (ref >60)
Glucose, Bld: 127 mg/dL — ABNORMAL HIGH (ref 65–99)
POTASSIUM: 3.6 mmol/L (ref 3.5–5.1)
Sodium: 142 mmol/L (ref 135–145)

## 2017-10-30 LAB — URINALYSIS, ROUTINE W REFLEX MICROSCOPIC
BILIRUBIN URINE: NEGATIVE
Glucose, UA: NEGATIVE mg/dL
KETONES UR: NEGATIVE mg/dL
Nitrite: POSITIVE — AB
PH: 5 (ref 5.0–8.0)
PROTEIN: 100 mg/dL — AB
Specific Gravity, Urine: 1.014 (ref 1.005–1.030)
WBC, UA: >50 WBC/hpf — ABNORMAL HIGH (ref 0–5)

## 2017-10-30 LAB — CBC WITH DIFFERENTIAL/PLATELET
BASOS PCT: 0 %
Basophils Absolute: 0 10*3/uL (ref 0.0–0.1)
EOS ABS: 0 10*3/uL (ref 0.0–0.7)
EOS PCT: 0 %
HCT: 33.6 % — ABNORMAL LOW (ref 39.0–52.0)
Hemoglobin: 10.3 g/dL — ABNORMAL LOW (ref 13.0–17.0)
LYMPHS ABS: 1.1 10*3/uL (ref 0.7–4.0)
Lymphocytes Relative: 7 %
MCH: 28.3 pg (ref 26.0–34.0)
MCHC: 30.7 g/dL (ref 30.0–36.0)
MCV: 92.3 fL (ref 78.0–100.0)
MONOS PCT: 6 %
Monocytes Absolute: 0.9 10*3/uL (ref 0.1–1.0)
Neutro Abs: 13.3 10*3/uL — ABNORMAL HIGH (ref 1.7–7.7)
Neutrophils Relative %: 87 %
PLATELETS: 228 10*3/uL (ref 150–400)
RBC: 3.64 MIL/uL — ABNORMAL LOW (ref 4.22–5.81)
RDW: 15.7 % — ABNORMAL HIGH (ref 11.5–15.5)
WBC: 15.3 10*3/uL — ABNORMAL HIGH (ref 4.0–10.5)

## 2017-10-30 LAB — I-STAT CG4 LACTIC ACID, ED: LACTIC ACID, VENOUS: 1.2 mmol/L (ref 0.5–1.9)

## 2017-10-30 LAB — TYPE AND SCREEN
ABO/RH(D): A POS
Antibody Screen: NEGATIVE

## 2017-10-30 LAB — MAGNESIUM: MAGNESIUM: 2 mg/dL (ref 1.7–2.4)

## 2017-10-30 LAB — LACTIC ACID, PLASMA: LACTIC ACID, VENOUS: 1.8 mmol/L (ref 0.5–1.9)

## 2017-10-30 MED ORDER — VANCOMYCIN HCL IN DEXTROSE 750-5 MG/150ML-% IV SOLN
750.0000 mg | Freq: Two times a day (BID) | INTRAVENOUS | Status: DC
  Filled 2017-10-30: qty 150

## 2017-10-30 MED ORDER — SODIUM CHLORIDE 0.9 % IV SOLN: 250.0000 mL | INTRAVENOUS | Status: DC | PRN

## 2017-10-30 MED ORDER — SODIUM CHLORIDE 0.9% FLUSH: 3.0000 mL | INTRAVENOUS | Status: DC | PRN

## 2017-10-30 MED ORDER — POTASSIUM CHLORIDE CRYS ER 20 MEQ PO TBCR
40.0000 meq | EXTENDED_RELEASE_TABLET | ORAL | Status: AC
  Administered 2017-10-30 (×2): 40 meq via ORAL
  Filled 2017-10-30 (×2): qty 2

## 2017-10-30 MED ORDER — ATORVASTATIN CALCIUM 20 MG PO TABS
20.0000 mg | ORAL_TABLET | Freq: Every day | ORAL | Status: DC
  Administered 2017-10-30 – 2017-11-02 (×4): 20 mg via ORAL
  Filled 2017-10-30 (×5): qty 1

## 2017-10-30 MED ORDER — ONDANSETRON HCL 4 MG PO TABS: 4.0000 mg | ORAL_TABLET | Freq: Four times a day (QID) | ORAL | Status: DC | PRN

## 2017-10-30 MED ORDER — CEFTRIAXONE SODIUM 2 G IJ SOLR
2.0000 g | INTRAMUSCULAR | Status: DC
  Administered 2017-10-30: 2 g via INTRAVENOUS
  Filled 2017-10-30: qty 20

## 2017-10-30 MED ORDER — HYDROCODONE-ACETAMINOPHEN 5-325 MG PO TABS: 0.5000 | ORAL_TABLET | ORAL | Status: DC | PRN

## 2017-10-30 MED ORDER — APIXABAN 5 MG PO TABS
5.0000 mg | ORAL_TABLET | Freq: Two times a day (BID) | ORAL | Status: DC
  Administered 2017-10-30 – 2017-11-03 (×10): 5 mg via ORAL
  Filled 2017-10-30 (×12): qty 1

## 2017-10-30 MED ORDER — OXYBUTYNIN CHLORIDE 5 MG PO TABS
5.0000 mg | ORAL_TABLET | Freq: Two times a day (BID) | ORAL | Status: DC
  Administered 2017-10-30 – 2017-10-31 (×3): 5 mg via ORAL
  Filled 2017-10-30 (×3): qty 1

## 2017-10-30 MED ORDER — DOFETILIDE 500 MCG PO CAPS
500.0000 ug | ORAL_CAPSULE | Freq: Two times a day (BID) | ORAL | Status: DC
  Administered 2017-10-30 – 2017-11-03 (×9): 500 ug via ORAL
  Filled 2017-10-30 (×11): qty 1

## 2017-10-30 MED ORDER — SODIUM CHLORIDE 0.9% FLUSH
3.0000 mL | Freq: Two times a day (BID) | INTRAVENOUS | Status: DC
  Administered 2017-10-30 – 2017-11-02 (×8): 3 mL via INTRAVENOUS

## 2017-10-30 MED ORDER — METHOTREXATE 2.5 MG PO TABS: 15.0000 mg | ORAL_TABLET | ORAL | Status: DC

## 2017-10-30 MED ORDER — FOLIC ACID 1 MG PO TABS
1.0000 mg | ORAL_TABLET | Freq: Every day | ORAL | Status: DC
  Administered 2017-10-30 – 2017-11-02 (×4): 1 mg via ORAL
  Filled 2017-10-30 (×4): qty 1

## 2017-10-30 MED ORDER — ACETAMINOPHEN 650 MG RE SUPP: 650.0000 mg | Freq: Four times a day (QID) | RECTAL | Status: DC | PRN

## 2017-10-30 MED ORDER — SODIUM CHLORIDE 0.9% FLUSH
3.0000 mL | Freq: Two times a day (BID) | INTRAVENOUS | Status: DC
  Administered 2017-10-30 – 2017-11-02 (×6): 3 mL via INTRAVENOUS

## 2017-10-30 MED ORDER — EZETIMIBE 10 MG PO TABS
10.0000 mg | ORAL_TABLET | Freq: Every day | ORAL | Status: DC
  Administered 2017-10-30 – 2017-11-03 (×5): 10 mg via ORAL
  Filled 2017-10-30 (×6): qty 1

## 2017-10-30 MED ORDER — PREDNISONE 5 MG PO TABS
5.0000 mg | ORAL_TABLET | Freq: Every day | ORAL | Status: DC
  Administered 2017-10-30 – 2017-11-03 (×5): 5 mg via ORAL
  Filled 2017-10-30 (×7): qty 1

## 2017-10-30 MED ORDER — ARTIFICIAL TEARS OPHTHALMIC OINT: TOPICAL_OINTMENT | Freq: Every day | OPHTHALMIC | Status: DC | PRN

## 2017-10-30 MED ORDER — OXYCODONE-ACETAMINOPHEN 5-325 MG PO TABS
1.0000 | ORAL_TABLET | Freq: Two times a day (BID) | ORAL | Status: DC | PRN
  Administered 2017-10-30 – 2017-11-03 (×2): 1 via ORAL
  Filled 2017-10-30 (×2): qty 1

## 2017-10-30 MED ORDER — ACETAMINOPHEN 325 MG PO TABS
650.0000 mg | ORAL_TABLET | Freq: Four times a day (QID) | ORAL | Status: DC | PRN
  Administered 2017-10-30 (×3): 650 mg via ORAL
  Filled 2017-10-30 (×3): qty 2

## 2017-10-30 MED ORDER — PIPERACILLIN-TAZOBACTAM 3.375 G IVPB
3.3750 g | Freq: Three times a day (TID) | INTRAVENOUS | Status: DC
  Administered 2017-10-30: 3.375 g via INTRAVENOUS
  Filled 2017-10-30: qty 50

## 2017-10-30 MED ORDER — PIPERACILLIN-TAZOBACTAM 3.375 G IVPB
3.3750 g | Freq: Three times a day (TID) | INTRAVENOUS | Status: DC
  Administered 2017-10-30 – 2017-11-01 (×6): 3.375 g via INTRAVENOUS
  Filled 2017-10-30 (×6): qty 50

## 2017-10-30 MED ORDER — DULOXETINE HCL 30 MG PO CPEP
30.0000 mg | ORAL_CAPSULE | Freq: Every day | ORAL | Status: DC
  Administered 2017-10-30 – 2017-11-02 (×5): 30 mg via ORAL
  Filled 2017-10-30 (×6): qty 1

## 2017-10-30 MED ORDER — DONEPEZIL HCL 10 MG PO TABS
10.0000 mg | ORAL_TABLET | Freq: Every day | ORAL | Status: DC
  Administered 2017-10-30 – 2017-11-02 (×4): 10 mg via ORAL
  Filled 2017-10-30 (×5): qty 1

## 2017-10-30 MED ORDER — PANTOPRAZOLE SODIUM 40 MG PO TBEC
40.0000 mg | DELAYED_RELEASE_TABLET | Freq: Every day | ORAL | Status: DC
  Administered 2017-10-30 – 2017-11-03 (×5): 40 mg via ORAL
  Filled 2017-10-30 (×5): qty 1

## 2017-10-30 MED ORDER — ONDANSETRON HCL 4 MG/2ML IJ SOLN
4.0000 mg | Freq: Four times a day (QID) | INTRAMUSCULAR | Status: DC | PRN
  Administered 2017-10-31: 4 mg via INTRAVENOUS
  Filled 2017-10-30: qty 2

## 2017-10-30 NOTE — ED Notes (Signed)
Called pharmacy to send medication for patient since he is holding in the ED.

## 2017-10-30 NOTE — ED Notes (Signed)
Spouse asking for patient's nighttime medications, RN notified MD Kathrynn Humble, hospitalist to see and order.

## 2017-10-30 NOTE — Progress Notes (Signed)
Pharmacy Antibiotic Note  Christopher Baker is a 84 y.o. male admitted on 10/29/2017 with AMS/fever, possible urosepsis.  Pharmacy has been consulted for Vancomycin and Zosyn  Dosing.  Vancomycin 1500 mg IV given in ED at  11 pm  Plan: Vancomycin 750 mg IV q12h Zosyn 3.375 g IV q8h      Temp (24hrs), Avg:102.1 F (38.9 C), Min:102.1 F (38.9 C), Max:102.1 F (38.9 C)  Recent Labs  Lab 10/29/17 2226 10/29/17 2238 10/30/17 0041  WBC 16.4*  --   --   CREATININE 1.22  --   --   LATICACIDVEN  --  2.55* 1.20    CrCl cannot be calculated (Unknown ideal weight.).    Allergies  Allergen Reactions  . Ciprofloxacin Nausea Only and Other (See Comments)    Stomach ache  . Hydrocodone-Acetaminophen Other (See Comments)    Hallucinations in higher doses  . Other Other (See Comments)    Rash from neoprene wrap after knee surgery  . Shellfish Allergy Nausea And Vomiting and Other (See Comments)    Reaction to scallops  . Tramadol Other (See Comments)    Pt becomes combative per wife    Caryl Pina 10/30/2017 1:29 AM

## 2017-10-30 NOTE — ED Notes (Signed)
5W Staff transferred to their floor.

## 2017-10-30 NOTE — ED Notes (Signed)
Attempted report and secretary advised the nurse was taking a pt to ICU and would call back when she gets on the floor.

## 2017-10-30 NOTE — ED Notes (Signed)
Wife arrived at bedside. Patient alert speaking with wife resting comfortably.

## 2017-10-30 NOTE — Progress Notes (Signed)
PHARMACY - PHYSICIAN COMMUNICATION CRITICAL VALUE ALERT - BLOOD CULTURE IDENTIFICATION (BCID)  Christopher Baker is an 84 y.o. male who presented to [facility] on 10/29/2017 with a chief complaint of fever, confusion and urinary retention.   Name of physician (or Provider) Contacted: Posey Pronto  Current antibiotics: zosyn  Changes to prescribed antibiotics recommended:  Change zosyn to ceftriaxone 2gm IV Q24H  Results for orders placed or performed during the hospital encounter of 10/29/17  Blood Culture ID Panel (Reflexed) (Collected: 10/29/2017 10:20 PM)  Result Value Ref Range   Enterococcus species NOT DETECTED NOT DETECTED   Listeria monocytogenes NOT DETECTED NOT DETECTED   Staphylococcus species NOT DETECTED NOT DETECTED   Staphylococcus aureus NOT DETECTED NOT DETECTED   Streptococcus species NOT DETECTED NOT DETECTED   Streptococcus agalactiae NOT DETECTED NOT DETECTED   Streptococcus pneumoniae NOT DETECTED NOT DETECTED   Streptococcus pyogenes NOT DETECTED NOT DETECTED   Acinetobacter baumannii NOT DETECTED NOT DETECTED   Enterobacteriaceae species DETECTED (A) NOT DETECTED   Enterobacter cloacae complex NOT DETECTED NOT DETECTED   Escherichia coli DETECTED (A) NOT DETECTED   Klebsiella oxytoca NOT DETECTED NOT DETECTED   Klebsiella pneumoniae NOT DETECTED NOT DETECTED   Proteus species NOT DETECTED NOT DETECTED   Serratia marcescens NOT DETECTED NOT DETECTED   Carbapenem resistance NOT DETECTED NOT DETECTED   Haemophilus influenzae NOT DETECTED NOT DETECTED   Neisseria meningitidis NOT DETECTED NOT DETECTED   Pseudomonas aeruginosa NOT DETECTED NOT DETECTED   Candida albicans NOT DETECTED NOT DETECTED   Candida glabrata NOT DETECTED NOT DETECTED   Candida krusei NOT DETECTED NOT DETECTED   Candida parapsilosis NOT DETECTED NOT DETECTED   Candida tropicalis NOT DETECTED NOT DETECTED    [name], [name] 10/30/2017  1:12 PM

## 2017-10-30 NOTE — ED Notes (Signed)
Pt just tried to get out of bed and pulled his 2nd condom cath off. Tele sitter ordered

## 2017-10-30 NOTE — ED Notes (Signed)
Attempted report and was told the nurse is not ready for this pt.

## 2017-10-30 NOTE — ED Notes (Signed)
Pt placed in hospital bed for comfort.

## 2017-10-30 NOTE — H&P (Signed)
History and Physical    Christopher Baker [id number]:[id number] DOB: [birth year] DOA: 10/29/2017  PCP: Haywood Pao, MD   Patient coming from: Home  Chief Complaint: Fever, confusion, urinary retention   HPI: Christopher Baker is a 84 y.o. male with medical history significant for chronic atrial fibrillation on Eliquis, history of prostate cancer status post radiation therapy, chronic systolic CHF with EF 20 to 25%, psoriatic arthritis, history of CVA with vascular dementia, and recent bladder irrigation and clot evacuation for gross hematuria attributed to postradiation syndrome, now presenting to the emergency department for evaluation of confusion, fever, and urinary retention.  Patient had been experiencing gross hematuria and was admitted briefly to [facility] 2 weeks ago where he underwent cystoscopy with irrigation and clot evacuation.  He had urinary retention following this and had a Foley catheter when he went home, removed in the clinic on 10/18/2017.  He has had some difficulty voiding since then, but this worsened acutely today.  He was also noted to develop some confusion over the past few days, worsening acutely today.  He was febrile today.  EMS was called, found the patient to be febrile and confused, gave him 1 g of acetaminophen, and brought him into the ED.  ED Course: Upon arrival to the ED, patient is found to be febrile to 38.9 C, saturating well on room air, blood pressure 94/52, and vitals otherwise normal.  EKG features atrial fibrillation with PVC, LVH, and nonspecific IVCD.  Chest x-ray is negative for acute cardiopulmonary disease.  Chemistry panel is unremarkable and CBC is notable for leukocytosis to 16,400 and a normocytic anemia with hemoglobin of 9.5.  Lactic acid is elevated to 2.55.  Blood cultures were collected in the ED, 3 L of normal saline bolused, and empiric vancomycin and Zosyn initiated.  Patient remains hemodynamically stable and will be admitted to the  telemetry unit for ongoing evaluation and management of sepsis, suspected secondary to UTI.  Review of Systems:  All other systems reviewed and apart from HPI, are negative.  Past Medical History:  Diagnosis Date  . Anxiety   . Arthritis    "hands, right hip; lower back" (02/28/2017)  . Arthritis with psoriasis ([place])   . Borderline diabetes    DIET CONTROLLED - PT STATES HE IS NOT DIABETIC  . Carotid stenosis, bilateral PER DR EARLY / DUPLEX  09-09-10     40 - 59%   BILATERALLY--- ASYMPTOMATIC  . Chronic lower back pain   . Depression   . Dysrhythmia    a-fib  . GERD (gastroesophageal reflux disease)    CONTROLLED W/ PROTONIX  . History of gout   . HTN (hypertension)   . Hyperlipidemia   . Iron deficiency   . Lumbar spondylosis W/ RADICULOPATHY  . OSA on CPAP   . PAF (paroxysmal atrial fibrillation) ([place])   . Presence of permanent cardiac pacemaker DDD-  06-04-10-   DDD; SECONDARY TO SYNCOPY AND BRADYCARDIA  . Prostate cancer ([place]) 2008   S/P 12 RADIATION  [place]    . Restless leg syndrome   . Stroke [facility]) Oct. 14, 2014   light left hand, balance issues, short term memory loss 2014  . SVT (supraventricular tachycardia) ([place])    S/P ABLATION   2008    Past Surgical History:  Procedure Laterality Date  . BACK SURGERY    . BIV UPGRADE N/A 09/07/2017   Procedure: BIV PACEMAKER UPGRADE;  Surgeon: Evans Lance, MD;  Location: [facility]  INVASIVE CV LAB;  Service: Cardiovascular;  Laterality: N/A;  . BREAST SURGERY     "removed tumor; don't remember which side; years ago" (02/28/2017)  . CARDIAC ELECTROPHYSIOLOGY STUDY AND ABLATION  2008   FOR SVT  . CARDIAC PACEMAKER PLACEMENT  06-04-10   DDD  . CARDIOVERSION N/A 03/02/2017   Procedure: CARDIOVERSION;  Surgeon: Josue Hector, MD;  Location: [facility] ENDOSCOPY;  Service: Cardiovascular;  Laterality: N/A;  . CATARACT EXTRACTION W/ INTRAOCULAR LENS  IMPLANT, BILATERAL Bilateral   . CHOLECYSTECTOMY  2009  . ESOPHAGOGASTRODUODENOSCOPY N/A  12/12/2014   Procedure: ESOPHAGOGASTRODUODENOSCOPY (EGD);  Surgeon: Wilford Corner, MD;  Location: [facility] ENDOSCOPY;  Service: Endoscopy;  Laterality: N/A;  . INCISION AND DRAINAGE Right 1997   "knee replacement got infected"  . INGUINAL HERNIA REPAIR Left 06/2003    DONE WITH PENILE PROSTESIS SURG.  . INGUINAL HERNIA REPAIR Right 1962  . JOINT REPLACEMENT    . KNEE ARTHROSCOPY  06/02/2011   Procedure: ARTHROSCOPY KNEE;  Surgeon: Gearlean Alf;  Location: [facility];  Service: Orthopedics;  Laterality: Left;  LEFT KNEE ARTHROSCOPY WITH DEBRIDEMENT  . LOOP RECORDER PLACEMENT  01-30-10  . LOOP RECORDER REMOVAL  06/04/2010  . LUMBAR LAMINECTOMY/ DISKECTOMY/ FUSION  05-19-10   L4 - 5  . PENILE PROSTHESIS IMPLANT  06/2003   AND LEFT CORPOROPLASTY /    DONE INGUINAL REPAIR  . PROSTATE BIOPSY  2007  . REVISION TOTAL KNEE ARTHROPLASTY Right 1997  . TOTAL KNEE ARTHROPLASTY  12/20/2011   Procedure: TOTAL KNEE ARTHROPLASTY;  Surgeon: Gearlean Alf, MD;  Location: [facility] ORS;  Service: Orthopedics;  Laterality: Left;  . TOTAL KNEE ARTHROPLASTY Right 1997  . TOTAL SHOULDER ARTHROPLASTY Right 06/27/2015   Procedure: REVERSE TOTAL SHOULDER ARTHROPLASTY;  Surgeon: Netta Cedars, MD;  Location: [facility];  Service: Orthopedics;  Laterality: Right;  . TRANSURETHRAL RESECTION OF BLADDER TUMOR WITH GYRUS (TURBT-GYRUS)  ?2018  . TRANSURETHRAL RESECTION OF PROSTATE  2006     reports that he quit smoking about 18 years ago. His smoking use included cigarettes. He has a 45.00 pack-year smoking history. He has never used smokeless tobacco. He reports that he drinks alcohol. He reports that he does not use drugs.  Allergies  Allergen Reactions  . Ciprofloxacin Nausea Only and Other (See Comments)    Stomach ache  . Hydrocodone-Acetaminophen Other (See Comments)    Hallucinations in higher doses  . Other Other (See Comments)    Rash from neoprene wrap after knee surgery  . Shellfish Allergy Nausea And  Vomiting and Other (See Comments)    Reaction to scallops  . Tramadol Other (See Comments)    Pt becomes combative per wife    Family History  Problem Relation Age of Onset  . Stroke Mother   . Early death Father      Prior to Admission medications   Medication Sig Start Date End Date Taking? Authorizing Provider  acetaminophen (TYLENOL) 500 MG tablet Take 1,000 mg by mouth 2 (two) times daily.    [provider]  amoxicillin (AMOXIL) 500 MG capsule Take 2,000 mg by mouth See admin instructions. Take 2000 mg by mouth one hour prior to dental apointments 09/02/16   [provider]  Artificial Tear Ointment (LUBRICANT EYE OP) Place 2 drops into both eyes daily as needed (for dry eyes).    [provider]  atorvastatin (LIPITOR) 20 MG tablet Take 20 mg by mouth daily.    [provider]  cyanocobalamin (,  VITAMIN B-12,) 1000 MCG/ML injection Inject 1,000 mcg into the muscle every 30 (thirty) days.     [provider]  dofetilide (TIKOSYN) 500 MCG capsule Take 1 capsule (500 mcg total) by mouth 2 (two) times daily. 10/27/17   Evans Lance, MD  donepezil (ARICEPT) 10 MG tablet TAKE ONE TABLET BY MOUTH EVERY NIGHT AT BEDTIME 10/27/17   Tomi Likens, Adam R, DO  DULoxetine (CYMBALTA) 30 MG capsule Take 1 capsule (30 mg total) by mouth daily. Patient taking differently: Take 30 mg by mouth at bedtime.  03/04/17   Baldwin Jamaica, PA-C  ELIQUIS 5 MG TABS tablet TAKE 1 TABLET (5 MG TOTAL) BY MOUTH 2 (TWO) TIMES DAILY. 06/27/17   Evans Lance, MD  ezetimibe (ZETIA) 10 MG tablet Take 1 tablet (10 mg total) by mouth daily. 03/28/15   Dutch Quint B, FNP  ferrous sulfate 325 (65 FE) MG tablet Take 325 mg by mouth 2 (two) times daily with a meal.     [provider]  folic acid (FOLVITE) 1 MG tablet Take 1 mg by mouth at bedtime.     [provider]  furosemide (LASIX) 40 MG tablet Take 80 mg by mouth daily.  02/11/17   [provider]    lisinopril (PRINIVIL,ZESTRIL) 5 MG tablet TAKE ONE TABLET BY MOUTH DAILY Patient taking differently: Take 10 mg by mouth at bedtime 11/26/16   Evans Lance, MD  loperamide (IMODIUM A-D) 2 MG tablet Take 2 mg by mouth 3 (three) times daily as needed for diarrhea or loose stools.     [provider]  methotrexate (RHEUMATREX) 2.5 MG tablet Take 15 mg by mouth every Monday.  10/02/11   [provider]  metoprolol succinate (TOPROL XL) 25 MG 24 hr tablet Take 1 tablet (25 mg total) by mouth daily. Patient taking differently: Take 25 mg by mouth at bedtime.  06/20/17   Evans Lance, MD  Misc. Devices (STEP N REST WALKER) MISC Use as directed Patient not taking: Reported on 08/31/2017 04/23/16   Pieter Partridge, DO  oxybutynin (DITROPAN) 5 MG tablet Take 5 mg by mouth 2 (two) times daily.  11/04/16   [provider]  oxyCODONE-acetaminophen (PERCOCET) 10-325 MG tablet Take 0.25-0.5 tablets by mouth 2 (two) times daily as needed for pain.  07/28/17   [provider]  pantoprazole (PROTONIX) 40 MG tablet Take 1 tablet (40 mg total) by mouth daily. Patient taking differently: Take 40 mg by mouth at bedtime.  10/22/15   Dutch Quint B, FNP  predniSONE (DELTASONE) 5 MG tablet Take 5 mg by mouth daily.    [provider]  Vitamin D, Ergocalciferol, (DRISDOL) 50000 units CAPS capsule Take 50,000 Units by mouth every Sunday.     [provider]    Physical Exam: Vitals:   10/29/17 2207 10/29/17 2330 10/29/17 2345 10/30/17 0000  BP: (!) 94/52 (!) 114/53 (!) 129/54 (!) 153/61  Pulse: 74 64 69 77  Resp: 13 19 (!) 30 (!) 26  Temp:      TempSrc:      SpO2: 95% 94% 95% 96%      Constitutional: NAD, appears uncomfortable Eyes: PERTLA, lids and conjunctivae normal ENMT: Mucous membranes are moist. Posterior pharynx clear of any exudate or lesions.   Neck: normal, supple, no masses, no thyromegaly Respiratory: clear to auscultation bilaterally, no  wheezing, no crackles. Normal respiratory effort.   Cardiovascular: Rate ~80 and irregular. Marked bilateral LE edema. Abdomen:  Distended and firm. Lower abdominal tenderness without rebound pain or guarding. Bowel sounds active.  Musculoskeletal: no clubbing / cyanosis. No joint deformity upper and lower extremities.    Skin: no significant rashes, lesions, ulcers. Warm, dry, well-perfused. Neurologic: No facial asymmetry. Sensation to light touch intact.  Moving all extremities spontaneously.  Psychiatric: Alert and oriented to person, place, and situation. Cooperative.    Labs on Admission: I have personally reviewed following labs and imaging studies  CBC: Recent Labs  Lab 10/29/17 2226  WBC 16.4*  HGB 9.5*  HCT 31.0*  MCV 93.4  PLT 671   Basic Metabolic Panel: Recent Labs  Lab 10/29/17 2226  NA 139  K 3.7  CL 105  CO2 23  GLUCOSE 129*  BUN 24*  CREATININE 1.22  CALCIUM 9.4   GFR: CrCl cannot be calculated (Unknown ideal weight.). Liver Function Tests: Recent Labs  Lab 10/29/17 2226  AST 20  ALT 23  ALKPHOS 66  BILITOT 0.6  PROT 5.7*  ALBUMIN 2.9*   No results for input(s): LIPASE, AMYLASE in the last 168 hours. No results for input(s): AMMONIA in the last 168 hours. Coagulation Profile: No results for input(s): INR, PROTIME in the last 168 hours. Cardiac Enzymes: No results for input(s): CKTOTAL, CKMB, CKMBINDEX, TROPONINI in the last 168 hours. BNP (last 3 results) No results for input(s): PROBNP in the last 8760 hours. HbA1C: No results for input(s): HGBA1C in the last 72 hours. CBG: No results for input(s): GLUCAP in the last 168 hours. Lipid Profile: No results for input(s): CHOL, HDL, LDLCALC, TRIG, CHOLHDL, LDLDIRECT in the last 72 hours. Thyroid Function Tests: No results for input(s): TSH, T4TOTAL, FREET4, T3FREE, THYROIDAB in the last 72 hours. Anemia Panel: No results for input(s): VITAMINB12, FOLATE, FERRITIN, TIBC, IRON, RETICCTPCT in  the last 72 hours. Urine analysis:    Component Value Date/Time   COLORURINE YELLOW 12/11/2014 [place] 12/11/2014 1349   LABSPEC 1.014 12/11/2014 1349   LABSPEC 1.010 01/19/2006 1347   PHURINE 7.0 12/11/2014 1349   GLUCOSEU NEGATIVE 12/11/2014 1349   HGBUR TRACE (A) 12/11/2014 1349   HGBUR 2+ 04/13/2010 0800   BILIRUBINUR NEGATIVE 12/11/2014 1349   BILIRUBINUR Negative 01/19/2006 1347   KETONESUR NEGATIVE 12/11/2014 1349   PROTEINUR NEGATIVE 12/11/2014 1349   UROBILINOGEN 1.0 12/11/2014 1349   NITRITE NEGATIVE 12/11/2014 1349   LEUKOCYTESUR NEGATIVE 12/11/2014 1349   LEUKOCYTESUR Small 01/19/2006 1347   Sepsis Labs: @LABRCNTIP (procalcitonin:4,lacticidven:4) )No results found for this or any previous visit (from the past 240 hour(s)).   Radiological Exams on Admission: Dg Chest Port 1 View  Result Date: 10/29/2017 CLINICAL DATA:  Fever and altered mental status EXAM: PORTABLE CHEST 1 VIEW COMPARISON:  09/08/2017 FINDINGS: Cardiac shadow is within normal limits. Pacing device is again seen and stable. The lungs are well aerated bilaterally. No focal infiltrate or sizable effusion is seen. No acute bony abnormality is noted. Postsurgical changes in the proximal right shoulder are noted. IMPRESSION: No acute abnormality seen. Electronically Signed   By: Inez Catalina M.D.   On: 10/29/2017 22:11    EKG: Independently reviewed. Atrial fibrillation, LVH, non-specific IVCD.   Assessment/Plan  1. Sepsis  - Presents with confusion, fever, and urinary retention  - Found to have fever, leukocytosis, lactate 2.55, SBP has remained >90  - CXR clear, urinary source suspected and cath urine being collected now; prior cultures grew Enterococcus sensitive to current abx  - Blood cultures collected in ED, 3 liters NS  given, and he was started on empiric vancomycin and Zosyn in ED   - Collect urine sample for UA and culture, continue current abx for now, trend lactate, follow  cultures and clinical course    2. Acute urinary retention  - Reports difficulty voiding since Foley removed on 4/30, worse today  - Abd distended and firm on admission  - Likely secondary to infection vs recurrent clots  - Place catheter    3. Chronic systolic CHF   - EF was 79-48% on echo from July 2018  - Appears well-compensated on admission but has just completed 3 liter NS bolus and may need diuresis once sepsis physiology resolved   - Hold Lasix, lisinopril, and beta-blocker initially in setting of sepsis  - SLIV now and follow daily wt and strict I/O's   4. Chronic atrial fibrillation - In rate-controlled a fib on admission  - CHADS-VASc at least 5 (age x2, CHF, CVA x2)  - Continue Eliquis and Tikosyn  5. Normocytic anemia   - Hgb is 9.5 on admission, down from 10.9 on 4/27  - Likely secondary to recent gross hematuria s/p irrigation and evacuation of clots at [facility]  - Type and screen, repeat CBC in am (dilutional drop in Hgb expected)    6. Hx of CVA; vascular dementia  - No focal deficit appreciated  - Continue Aricept, Eliquis, statin    7. Psoriatic arthritis  - Stable, manages with MTX and low-dose prednisone     DVT prophylaxis: Eliquis  Code Status: Full  Family Communication: Wife updated at bedside Consults called: None Admission status: Inpatient    Vianne Bulls, MD [facility] Pager [phone]  If 7PM-7AM, please contact night-coverage [email or web address] Password [facility]  10/30/2017, 12:33 AM

## 2017-10-30 NOTE — ED Notes (Signed)
Attempted report and was put on hold. After 10 mins nobody answered phone.

## 2017-10-30 NOTE — Progress Notes (Addendum)
[facility] PLAN OF CARE NOTE Patient: Christopher Baker JTT:[id number]   PCP: Haywood Pao, MD DOB: 1934-01-10   DOA: 10/29/2017   DOS: 10/30/2017    Patient was admitted by my colleague Dr. Myna Hidalgo earlier on 10/30/2017. I have reviewed the H&P as well as assessment and plan and agree with the same. Important changes in the plan are listed below.  Plan of care: Principal Problem:   Sepsis, unspecified organism [facility]) Active Problems:   Essential hypertension   Atrial fibrillation, chronic (HCC)   PSORIATIC ARTHRITIS   PROSTATE CANCER, HX OF   History of completed stroke   Vascular dementia   OSA (obstructive sleep apnea)   Normocytic anemia   Chronic systolic heart failure ([place])   Acute urinary retention Urinary retention. Requesting RN to check post void bladder scan x2 and page. If the patient has persistent retention of more than 300 cc patient will need Foley catheter reinsertion. If the patient does not have retention at present post void, we will need to discuss oxybutynin as that will lead to retention. Patient generally follows up with urologist as an outpatient at [facility], will discuss with our urology should the need arise here. No further hematuria at present. No CVA tenderness.  Frequent PVCs, NSVT x1. Replace potassium, magnesium. Patient on Tikosyn.  QTC prolonged but not prolonged from outpatient. Continue to monitor on telemetry.  Blood cultures growing E. coli. Antibiotics change from Zosyn to IV ceftriaxone.  Although the patient's recent procedure-cystoscopy patient will require hospital-acquired infection coverage and therefore will continue IV Zosyn.   Author: Berle Mull, MD [facility] Pager: [phone] 10/30/2017 10:31 AM   If 7PM-7AM, please contact night-coverage at [email or web address], password [facility]

## 2017-10-30 NOTE — ED Notes (Signed)
After removing 2nd condom cath, pt placed in brief for the time being.  Bed changed and pt is resting

## 2017-10-30 NOTE — ED Notes (Signed)
RN found patient to be soiled of urine with empty urinal between legs; attempted to bladder scan patient, with application of KY jelly on abdomen, patient began to void without control; bladder scan = 56mL. Condom cath applied at this time; all linen changed and patient remains dry

## 2017-10-31 LAB — COMPREHENSIVE METABOLIC PANEL
ALT: 29 U/L (ref 17–63)
ANION GAP: 7 (ref 5–15)
AST: 26 U/L (ref 15–41)
Albumin: 2.5 g/dL — ABNORMAL LOW (ref 3.5–5.0)
Alkaline Phosphatase: 63 U/L (ref 38–126)
BUN: 16 mg/dL (ref 6–20)
CHLORIDE: 112 mmol/L — AB (ref 101–111)
CO2: 20 mmol/L — AB (ref 22–32)
Calcium: 9 mg/dL (ref 8.9–10.3)
Creatinine, Ser: 1.18 mg/dL (ref 0.61–1.24)
GFR calc Af Amer: >60 mL/min (ref >60)
GFR calc non Af Amer: 56 mL/min — ABNORMAL LOW (ref >60)
GLUCOSE: 135 mg/dL — AB (ref 65–99)
POTASSIUM: 4.3 mmol/L (ref 3.5–5.1)
Sodium: 139 mmol/L (ref 135–145)
TOTAL PROTEIN: 5.5 g/dL — AB (ref 6.5–8.1)
Total Bilirubin: 0.6 mg/dL (ref 0.3–1.2)

## 2017-10-31 LAB — CBC WITH DIFFERENTIAL/PLATELET
BASOS PCT: 0 %
Basophils Absolute: 0 10*3/uL (ref 0.0–0.1)
EOS ABS: 0 10*3/uL (ref 0.0–0.7)
EOS PCT: 0 %
HCT: 34.3 % — ABNORMAL LOW (ref 39.0–52.0)
Hemoglobin: 10.7 g/dL — ABNORMAL LOW (ref 13.0–17.0)
LYMPHS PCT: 5 %
Lymphs Abs: 0.9 10*3/uL (ref 0.7–4.0)
MCH: 28.8 pg (ref 26.0–34.0)
MCHC: 31.2 g/dL (ref 30.0–36.0)
MCV: 92.5 fL (ref 78.0–100.0)
MONO ABS: 0.8 10*3/uL (ref 0.1–1.0)
Monocytes Relative: 5 %
Neutro Abs: 16.2 10*3/uL — ABNORMAL HIGH (ref 1.7–7.7)
Neutrophils Relative %: 90 %
PLATELETS: 213 10*3/uL (ref 150–400)
RBC: 3.71 MIL/uL — ABNORMAL LOW (ref 4.22–5.81)
RDW: 15.9 % — AB (ref 11.5–15.5)
WBC: 17.9 10*3/uL — ABNORMAL HIGH (ref 4.0–10.5)

## 2017-10-31 MED ORDER — ENSURE ENLIVE PO LIQD
237.0000 mL | Freq: Two times a day (BID) | ORAL | Status: DC
  Administered 2017-11-01: 237 mL via ORAL

## 2017-10-31 NOTE — Progress Notes (Signed)
[facility] Progress Note  Patient: Christopher Baker YSA:[id number]   PCP: Haywood Pao, MD DOB: March 29, 1934   DOA: 10/29/2017   DOS: 10/31/2017   Date of Service: the patient was seen and examined on 10/31/2017  Subjective: Feeling better, no nausea no vomiting.  Some cough.  No diarrhea.  Bladder scans negative postvoid.  Brief hospital course: Pt. with PMH of chronic atrial fibrillation on Eliquis, history of prostate cancer status post radiation therapy, chronic systolic CHF with EF 20 to 25%, psoriatic arthritis, history of CVA with vascular dementia, and recent bladder irrigation and clot evacuation for gross hematuria attributed to postradiation syndrome; admitted on 10/29/2017, presented with complaint of fever and urine retention, was found to have sepsis due to E. coli. Currently further plan is to IV antibiotics.  Assessment and Plan: 1. Sepsis  due to E. coli bacteremia - Presents with confusion, fever, and urinary retention  - Found to have fever, leukocytosis, lactate 2.55, SBP has remained >90  - CXR clear, urinary source suspected and cath urine being collected now; prior cultures grew Enterococcus sensitive to current abx  - Blood cultures collected in ED, 3 liters NS given, and he was started on empiric vancomycin and Zosyn in ED   - Blood cultures are positive for E. coli as well as urine. Currently on IV Zosyn since the patient has recently undergone cystoscopy procedure. Once sensitivities are available we will narrow down the antibiotic. Although not necessary given patient's persistent low-grade fever as well as leukocytosis I will repeat another culture tomorrow to ensure clearance.  2. Acute urinary retention  - Reports difficulty voiding since Foley removed on 4/30, although no further urinary retention on multiple bladder scan since last 24 hours. - Discontinue oxybutynin  3. Chronic systolic CHF   - EF was 32-35% on echo from July 2018  - Appears  well-compensated on admission but has just completed 3 liter NS bolus and may need diuresis once sepsis physiology resolved   - Hold Lasix, lisinopril, and beta-blocker initially in setting of sepsis   4. Chronic atrial fibrillation - In rate-controlled a fib on admission  - CHADS-VASc at least 5 (age x2, CHF, CVA x2)  - Continue Eliquis and Tikosyn  5. Normocytic anemia   - Hgb is 9.5 on admission, down from 10.9 on 4/27  - Likely secondary to recent gross hematuria s/p irrigation and evacuation of clots at [facility]  - Hb stable  6. Hx of CVA; vascular dementia  - No focal deficit appreciated  - Continue Aricept, Eliquis, statin    7. Psoriatic arthritis  - Stable, manages with MTX and low-dose prednisone  Hold methotrexate.  Diet: cardiac diet DVT Prophylaxis: subcutaneous Heparin  Advance goals of care discussion: full code  Family Communication: family was present at bedside, at the time of interview. The pt provided permission to discuss medical plan with the family. Opportunity was given to ask question and all questions were answered satisfactorily.   Disposition:  Discharge to be determined.  Consultants: none Procedures: none  Antibiotics: Anti-infectives (From admission, onward)   Start     Dose/Rate Route Frequency Ordered Stop   10/30/17 1800  vancomycin (VANCOCIN) IVPB 750 mg/150 ml premix  Status:  Discontinued     750 mg 150 mL/hr over 60 Minutes Intravenous Every 12 hours 10/30/17 0132 10/30/17 1251   10/30/17 1800  piperacillin-tazobactam (ZOSYN) IVPB 3.375 g     3.375 g 12.5 mL/hr over 240 Minutes Intravenous Every 8 hours  10/30/17 1521     10/30/17 1400  cefTRIAXone (ROCEPHIN) 2 g in sodium chloride 0.9 % 100 mL IVPB  Status:  Discontinued     2 g 200 mL/hr over 30 Minutes Intravenous Every 24 hours 10/30/17 1321 10/30/17 1520   10/30/17 0600  vancomycin (VANCOCIN) IVPB 750 mg/150 ml premix  Status:  Discontinued     750 mg 150 mL/hr over 60 Minutes  Intravenous Every 12 hours 10/30/17 0131 10/30/17 0132   10/30/17 0600  piperacillin-tazobactam (ZOSYN) IVPB 3.375 g  Status:  Discontinued     3.375 g 12.5 mL/hr over 240 Minutes Intravenous Every 8 hours 10/30/17 0131 10/30/17 1321   10/29/17 2300  piperacillin-tazobactam (ZOSYN) IVPB 3.375 g     3.375 g 100 mL/hr over 30 Minutes Intravenous  Once 10/29/17 2247 10/29/17 2340   10/29/17 2300  vancomycin (VANCOCIN) IVPB 1000 mg/200 mL premix  Status:  Discontinued     1,000 mg 200 mL/hr over 60 Minutes Intravenous  Once 10/29/17 2247 10/29/17 2256   10/29/17 2300  vancomycin (VANCOCIN) 1,500 mg in sodium chloride 0.9 % 500 mL IVPB     1,500 mg 250 mL/hr over 120 Minutes Intravenous  Once 10/29/17 2256 10/30/17 0111     Objective: Physical Exam: Vitals:   10/31/17 0452 10/31/17 1000 10/31/17 1254 10/31/17 1339  BP: 129/87   138/70  Pulse: 97   80  Resp: 20   20  Temp:  99.1 F (37.3 C)  99 F (37.2 C)  TempSrc:  Oral  Oral  SpO2: 94%   92%  Weight:   96.2 kg (212 lb)   Height:   6\' 3"  (1.905 m)     Intake/Output Summary (Last 24 hours) at 10/31/2017 1512 Last data filed at 10/31/2017 1433 Gross per 24 hour  Intake 740 ml  Output 700 ml  Net 40 ml   Filed Weights   10/31/17 1254  Weight: 96.2 kg (212 lb)   General: Alert, Awake and Oriented to Time, Place and Person. Appear in mild distress, affect appropriate Eyes: PERRL, Conjunctiva normal ENT: Oral Mucosa clear moist. Neck: no JVD, no Abnormal Mass Or lumps Cardiovascular: S1 and S2 Present, no Murmur, Peripheral Pulses Present Respiratory: normal respiratory effort, Bilateral Air entry equal and Decreased, no use of accessory muscle, Clear to Auscultation, no Crackles, no wheezes Abdomen: Bowel Sound present, Soft and no tenderness, no hernia Skin: no redness, no Rash, no induration Extremities: no Pedal edema, no calf tenderness Neurologic: Grossly no focal neuro deficit. Bilaterally Equal motor strength  Data  Reviewed: CBC: Recent Labs  Lab 10/29/17 2226 10/30/17 0333 10/31/17 0759  WBC 16.4* 15.3* 17.9*  NEUTROABS  --  13.3* 16.2*  HGB 9.5* 10.3* 10.7*  HCT 31.0* 33.6* 34.3*  MCV 93.4 92.3 92.5  PLT 265 228 676   Basic Metabolic Panel: Recent Labs  Lab 10/29/17 2226 10/30/17 0333 10/31/17 0759  NA 139 142 139  K 3.7 3.6 4.3  CL 105 113* 112*  CO2 23 21* 20*  GLUCOSE 129* 127* 135*  BUN 24* 20 16  CREATININE 1.22 1.14 1.18  CALCIUM 9.4 8.7* 9.0  MG  --  2.0  --     Liver Function Tests: Recent Labs  Lab 10/29/17 2226 10/31/17 0759  AST 20 26  ALT 23 29  ALKPHOS 66 63  BILITOT 0.6 0.6  PROT 5.7* 5.5*  ALBUMIN 2.9* 2.5*   Studies: No results found.  Scheduled Meds: . apixaban  5 mg Oral  BID  . atorvastatin  20 mg Oral q1800  . dofetilide  500 mcg Oral BID  . donepezil  10 mg Oral QHS  . DULoxetine  30 mg Oral QHS  . ezetimibe  10 mg Oral Daily  . [START ON 11/01/2017] feeding supplement (ENSURE ENLIVE)  237 mL Oral BID BM  . folic acid  1 mg Oral QHS  . oxybutynin  5 mg Oral BID  . pantoprazole  40 mg Oral Daily  . predniSONE  5 mg Oral Q breakfast  . sodium chloride flush  3 mL Intravenous Q12H  . sodium chloride flush  3 mL Intravenous Q12H   Continuous Infusions: . sodium chloride    . piperacillin-tazobactam (ZOSYN)  IV 3.375 g (10/31/17 1038)   PRN Meds: sodium chloride, acetaminophen **OR** acetaminophen, artificial tears, ondansetron **OR** ondansetron (ZOFRAN) IV, oxyCODONE-acetaminophen, sodium chloride flush  Time spent: 35 minutes  Author: Berle Mull, MD [facility] Pager: [phone] 10/31/2017 3:12 PM  If 7PM-7AM, please contact night-coverage at [email or web address], password [facility]

## 2017-10-31 NOTE — Progress Notes (Signed)
Transferred patient from [facility] to [phone] with wife accompanying. Identified appropriately and assessed. Placed patient on telemetry, oriented patient and wife to room and unit and instructed how to call. Call bell within reach, bed in lowest position. Will continue to monitor.

## 2017-11-01 LAB — MAGNESIUM: Magnesium: 2.2 mg/dL (ref 1.7–2.4)

## 2017-11-01 LAB — BASIC METABOLIC PANEL
Anion gap: 10 (ref 5–15)
BUN: 20 mg/dL (ref 6–20)
CO2: 23 mmol/L (ref 22–32)
CREATININE: 1.23 mg/dL (ref 0.61–1.24)
Calcium: 9 mg/dL (ref 8.9–10.3)
Chloride: 109 mmol/L (ref 101–111)
GFR calc non Af Amer: 53 mL/min — ABNORMAL LOW (ref >60)
Glucose, Bld: 112 mg/dL — ABNORMAL HIGH (ref 65–99)
Potassium: 3.9 mmol/L (ref 3.5–5.1)
SODIUM: 142 mmol/L (ref 135–145)

## 2017-11-01 LAB — CBC
HCT: 34.1 % — ABNORMAL LOW (ref 39.0–52.0)
Hemoglobin: 10.6 g/dL — ABNORMAL LOW (ref 13.0–17.0)
MCH: 29 pg (ref 26.0–34.0)
MCHC: 31.1 g/dL (ref 30.0–36.0)
MCV: 93.2 fL (ref 78.0–100.0)
Platelets: 223 10*3/uL (ref 150–400)
RBC: 3.66 MIL/uL — AB (ref 4.22–5.81)
RDW: 15.6 % — ABNORMAL HIGH (ref 11.5–15.5)
WBC: 11.9 10*3/uL — AB (ref 4.0–10.5)

## 2017-11-01 LAB — CULTURE, BLOOD (ROUTINE X 2): SPECIAL REQUESTS: ADEQUATE

## 2017-11-01 LAB — URINE CULTURE: Culture: 80000 — AB

## 2017-11-01 MED ORDER — ENSURE ENLIVE PO LIQD
237.0000 mL | Freq: Three times a day (TID) | ORAL | Status: DC
  Administered 2017-11-01 – 2017-11-03 (×5): 237 mL via ORAL

## 2017-11-01 MED ORDER — HYDRALAZINE HCL 20 MG/ML IJ SOLN
10.0000 mg | Freq: Once | INTRAMUSCULAR | Status: AC
  Administered 2017-11-01: 10 mg via INTRAVENOUS
  Filled 2017-11-01: qty 1

## 2017-11-01 MED ORDER — CEFTRIAXONE SODIUM 2 G IJ SOLR
2.0000 g | INTRAMUSCULAR | Status: DC
Start: 2017-11-01 — End: 2017-11-03
  Administered 2017-11-01 – 2017-11-03 (×3): 2 g via INTRAVENOUS
  Filled 2017-11-01 (×3): qty 20

## 2017-11-01 MED ORDER — METOPROLOL SUCCINATE ER 25 MG PO TB24
25.0000 mg | ORAL_TABLET | Freq: Every day | ORAL | Status: DC
  Administered 2017-11-01 – 2017-11-03 (×3): 25 mg via ORAL
  Filled 2017-11-01 (×3): qty 1

## 2017-11-01 MED ORDER — SACCHAROMYCES BOULARDII 250 MG PO CAPS
250.0000 mg | ORAL_CAPSULE | Freq: Two times a day (BID) | ORAL | Status: DC
  Administered 2017-11-01 – 2017-11-03 (×5): 250 mg via ORAL
  Filled 2017-11-01 (×5): qty 1

## 2017-11-01 MED ORDER — POTASSIUM CHLORIDE CRYS ER 20 MEQ PO TBCR
20.0000 meq | EXTENDED_RELEASE_TABLET | Freq: Every day | ORAL | Status: DC
  Administered 2017-11-01 – 2017-11-03 (×3): 20 meq via ORAL
  Filled 2017-11-01 (×3): qty 1

## 2017-11-01 MED ORDER — FERROUS SULFATE 325 (65 FE) MG PO TABS
325.0000 mg | ORAL_TABLET | Freq: Two times a day (BID) | ORAL | Status: DC
  Administered 2017-11-01 – 2017-11-03 (×5): 325 mg via ORAL
  Filled 2017-11-01 (×5): qty 1

## 2017-11-01 NOTE — Discharge Instructions (Signed)

## 2017-11-01 NOTE — Progress Notes (Addendum)
Physical Therapy Evaluation Patient Details Name: Christopher Baker MRN: [id number] DOB: April 09, 1934 Today's Date: 11/01/2017   History of Present Illness  Pt. with PMH of chronic atrial fibrillation on Eliquis, history of prostate cancer status post radiation therapy, chronic systolic CHF with EF 20 to 25%, psoriatic arthritis, history of CVA with vascular dementia, and recent bladder irrigation and clot evacuation for gross hematuria attributed to postradiation syndrome; admitted on 10/29/2017, presented with complaint of fever and urine retention, was found to have sepsis due to E. coli.  Clinical Impression  Patient presents with decreased independence with mobility due to weakness and chronic LBP issues.  Currently minguard overall for safety and balance.  HR with mobility up to 123.  Feel he should be able to go home with wife support and follow up HHPT when stable.  PT to follow acutely.    Follow Up Recommendations Home health PT;Supervision/Assistance - 24 hour    Equipment Recommendations  3in1 (PT)    Recommendations for Other Services       Precautions / Restrictions Precautions Precautions: Fall Precaution Comments: watch HR Restrictions Weight Bearing Restrictions: No      Mobility  Bed Mobility Overal bed mobility: Needs Assistance Bed Mobility: Supine to Sit     Supine to sit: Min assist;HOB elevated     General bed mobility comments: increased time, assist for trunk  Transfers Overall transfer level: Needs assistance Equipment used: Rolling walker (2 wheeled) Transfers: Sit to/from Stand Sit to Stand: Min guard         General transfer comment: with increased time and effort  Ambulation/Gait Ambulation/Gait assistance: Min guard Ambulation Distance (Feet): 160 Feet Assistive device: Rolling walker (2 wheeled) Gait Pattern/deviations: Step-through pattern;Decreased stride length;Trunk flexed;Wide base of support     General Gait Details: used to  rollator so increased time for turning, no physical help, but minguard for safety  Stairs            Wheelchair Mobility    Modified Rankin (Stroke Patients Only)       Balance Overall balance assessment: Needs assistance   Sitting balance-Leahy Scale: Good     Standing balance support: Bilateral upper extremity supported Standing balance-Leahy Scale: Poor Standing balance comment: used to UE support for balance                             Pertinent Vitals/Pain Pain Assessment: Faces Faces Pain Scale: Hurts even more Pain Location: back pain chronic Pain Descriptors / Indicators: Constant Pain Intervention(s): Repositioned;Monitored during session    Home Living Family/patient expects to be discharged to:: Private residence Living Arrangements: Spouse/significant other Available Help at Discharge: Family;Available 24 hours/day Type of Home: House Home Access: Stairs to enter Entrance Stairs-Rails: Psychiatric nurse of Steps: 3 Home Layout: Able to live on main level with bedroom/bathroom Home Equipment: Walker - 4 wheels      Prior Function Level of Independence: Independent with assistive device(s)         Comments: amb with rollator      Hand Dominance   Dominant Hand: Right    Extremity/Trunk Assessment   Upper Extremity Assessment Upper Extremity Assessment: Overall WFL for tasks assessed    Lower Extremity Assessment Lower Extremity Assessment: Generalized weakness    Cervical / Trunk Assessment Cervical / Trunk Assessment: Lordotic;Other exceptions Cervical / Trunk Exceptions: forward head, limited cervical AROM  Communication   Communication: No difficulties  Cognition Arousal/Alertness: Awake/alert  Behavior During Therapy: WFL for tasks assessed/performed Overall Cognitive Status: History of cognitive impairments - at baseline                                 General Comments: mostly WFL,  forgot he'd had back surgery      General Comments General comments (skin integrity, edema, etc.): edema in feet, but denies numbness; incontinent of loose stool on the way to [facility] and needed assist for hygiene in standing    Exercises     Assessment/Plan    PT Assessment Patient needs continued PT services  PT Problem List Decreased strength;Decreased mobility;Decreased balance;Decreased knowledge of use of DME;Decreased knowledge of precautions       PT Treatment Interventions DME instruction;Therapeutic activities;Patient/family education;Therapeutic exercise;Gait training;Stair training;Balance training;Functional mobility training    PT Goals (Current goals can be found in the Care Plan section)  Acute Rehab PT Goals Patient Stated Goal: to return to independent PT Goal Formulation: With patient/family Time For Goal Achievement: 11/08/17 Potential to Achieve Goals: Good    Frequency Min 3X/week   Barriers to discharge        Co-evaluation               AM-PAC PT "6 Clicks" Daily Activity  Outcome Measure Difficulty turning over in bed (including adjusting bedclothes, sheets and blankets)?: A Little Difficulty moving from lying on back to sitting on the side of the bed? : A Lot Difficulty sitting down on and standing up from a chair with arms (e.g., wheelchair, bedside commode, etc,.)?: A Lot Help needed moving to and from a bed to chair (including a wheelchair)?: A Little Help needed walking in hospital room?: A Little Help needed climbing 3-5 steps with a railing? : A Lot 6 Click Score: 15    End of Session Equipment Utilized During Treatment: Gait belt Activity Tolerance: Patient tolerated treatment well Patient left: in chair;with call bell/phone within reach;with chair alarm set   PT Visit Diagnosis: Other abnormalities of gait and mobility (R26.89);Muscle weakness (generalized) (M62.81)    Time: 8280-0349 PT Time Calculation (min) (ACUTE ONLY): 36  min   Charges:   PT Evaluation $PT Eval Moderate Complexity: 1 Mod PT Treatments $Gait Training: 8-22 mins   PT G CodesMagda Kiel, [place] [phone] 11/01/2017   Reginia Naas 11/01/2017, 12:00 PM

## 2017-11-01 NOTE — Progress Notes (Signed)
[facility] Progress Note  Patient: Christopher Baker TWS:[id number]   PCP: Haywood Pao, MD DOB: July 04, 1933   DOA: 10/29/2017   DOS: 11/01/2017   Date of Service: the patient was seen and examined on 11/01/2017  Subjective: Continues to have a weakness although feeling better.  No nausea no vomiting.  Continues to empty his bladder totally.  No spasms reported.  Reports smear stool every time he tries to urinate.  Brief hospital course: Pt. with PMH of chronic atrial fibrillation on Eliquis, history of prostate cancer status post radiation therapy, chronic systolic CHF with EF 20 to 25%, psoriatic arthritis, history of CVA with vascular dementia, and recent bladder irrigation and clot evacuation for gross hematuria attributed to postradiation syndrome; admitted on 10/29/2017, presented with complaint of fever and urine retention, was found to have sepsis due to E. coli. Currently further plan is to IV antibiotics.  Assessment and Plan: 1. Sepsis  due to E. coli bacteremia - Presents with confusion, fever, and urinary retention  - Found to have fever, leukocytosis, lactate 2.55, SBP has remained >90  - CXR clear, urinary source suspected and cath urine being collected now; prior cultures grew Enterococcus sensitive to current abx  - Blood cultures collected in ED, 3 liters NS given, and he was started on empiric vancomycin and Zosyn in ED   - Blood cultures are positive for E. coli as well as urine. Patient was kept on IV Zosyn since the has recently undergone cystoscopy procedure. Based on sensitivities patient will be narrowed down to IV ceftriaxone, will monitor today's culture and if negative tomorrow can transition to oral Omnicef/Vantin.  2. Acute urinary retention  - Reports difficulty voiding since Foley removed on 4/30, although no further urinary retention on multiple bladder scan since admission - Discontinue oxybutynin -Follow-up with primary urologist Dr. Amalia Hailey.  3.  Chronic systolic CHF   Biv pacemaker implant for CRT - EF was 20-25% on echo from July 2018  - Appears well-compensated on admission but has just completed 3 liter NS bolus and may need diuresis once sepsis physiology resolved   - Hold Lasix, lisinopril.  Likely can resume tomorrow -Resume beta-blocker  4. Chronic atrial fibrillation On Tikosyn - In rate-controlled a fib on admission  - CHADS-VASc at least 5 (age x2, CHF, CVA x2)  - Continue Eliquis and Tikosyn - Maintain K more than 4 mag more than 2  5. Normocytic anemia   - Hgb is 9.5 on admission, down from 10.9 on 4/27  - Likely secondary to recent gross hematuria s/p irrigation and evacuation of clots at [facility]  - Hb stable continue iron supplementation  6. Hx of CVA; vascular dementia  - No focal deficit appreciated  - Continue Aricept, Eliquis, statin    7. Psoriatic arthritis  - Stable, manages with MTX and low-dose prednisone  Hold methotrexate while on antibiotic  Diet: cardiac diet DVT Prophylaxis: subcutaneous Heparin  Advance goals of care discussion: full code  Family Communication: family was present at bedside, at the time of interview. The pt provided permission to discuss medical plan with the family. Opportunity was given to ask question and all questions were answered satisfactorily.   Disposition:  Discharge to home with home health, likely Thursday  Consultants: none Procedures: none  Antibiotics: Anti-infectives (From admission, onward)   Start     Dose/Rate Route Frequency Ordered Stop   11/01/17 1015  cefTRIAXone (ROCEPHIN) 2 g in sodium chloride 0.9 % 100 mL IVPB  2 g 200 mL/hr over 30 Minutes Intravenous Every 24 hours 11/01/17 1003     10/30/17 1800  vancomycin (VANCOCIN) IVPB 750 mg/150 ml premix  Status:  Discontinued     750 mg 150 mL/hr over 60 Minutes Intravenous Every 12 hours 10/30/17 0132 10/30/17 1251   10/30/17 1800  piperacillin-tazobactam (ZOSYN) IVPB 3.375 g  Status:   Discontinued     3.375 g 12.5 mL/hr over 240 Minutes Intravenous Every 8 hours 10/30/17 1521 11/01/17 1002   10/30/17 1400  cefTRIAXone (ROCEPHIN) 2 g in sodium chloride 0.9 % 100 mL IVPB  Status:  Discontinued     2 g 200 mL/hr over 30 Minutes Intravenous Every 24 hours 10/30/17 1321 10/30/17 1520   10/30/17 0600  vancomycin (VANCOCIN) IVPB 750 mg/150 ml premix  Status:  Discontinued     750 mg 150 mL/hr over 60 Minutes Intravenous Every 12 hours 10/30/17 0131 10/30/17 0132   10/30/17 0600  piperacillin-tazobactam (ZOSYN) IVPB 3.375 g  Status:  Discontinued     3.375 g 12.5 mL/hr over 240 Minutes Intravenous Every 8 hours 10/30/17 0131 10/30/17 1321   10/29/17 2300  piperacillin-tazobactam (ZOSYN) IVPB 3.375 g     3.375 g 100 mL/hr over 30 Minutes Intravenous  Once 10/29/17 2247 10/29/17 2340   10/29/17 2300  vancomycin (VANCOCIN) IVPB 1000 mg/200 mL premix  Status:  Discontinued     1,000 mg 200 mL/hr over 60 Minutes Intravenous  Once 10/29/17 2247 10/29/17 2256   10/29/17 2300  vancomycin (VANCOCIN) 1,500 mg in sodium chloride 0.9 % 500 mL IVPB     1,500 mg 250 mL/hr over 120 Minutes Intravenous  Once 10/29/17 2256 10/30/17 0111     Objective: Physical Exam: Vitals:   10/31/17 2103 11/01/17 0433 11/01/17 0500 11/01/17 1353  BP: (!) 152/75 137/84  138/77  Pulse: 83 82  82  Resp: 18 18  18   Temp: 98.1 F (36.7 C) 98.3 F (36.8 C)  98.3 F (36.8 C)  TempSrc: Oral Oral    SpO2: 94% 92%  95%  Weight:   100.4 kg (221 lb 5.5 oz)   Height:        Intake/Output Summary (Last 24 hours) at 11/01/2017 1529 Last data filed at 11/01/2017 1500 Gross per 24 hour  Intake 338 ml  Output 1850 ml  Net -1512 ml   Filed Weights   10/31/17 1254 11/01/17 0500  Weight: 96.2 kg (212 lb) 100.4 kg (221 lb 5.5 oz)   General: Alert, Awake and Oriented to Time, Place and Person. Appear in mild distress, affect appropriate Eyes: PERRL, Conjunctiva normal ENT: Oral Mucosa clear moist. Neck: no  JVD, no Abnormal Mass Or lumps Cardiovascular: S1 and S2 Present, no Murmur, Peripheral Pulses Present Respiratory: normal respiratory effort, Bilateral Air entry equal and Decreased, no use of accessory muscle, Clear to Auscultation, no Crackles, no wheezes Abdomen: Bowel Sound present, Soft and no tenderness, no hernia Skin: no redness, no Rash, no induration Extremities: no Pedal edema, no calf tenderness Neurologic: Grossly no focal neuro deficit. Bilaterally Equal motor strength  Data Reviewed: CBC: Recent Labs  Lab 10/29/17 2226 10/30/17 0333 10/31/17 0759 11/01/17 0423  WBC 16.4* 15.3* 17.9* 11.9*  NEUTROABS  --  13.3* 16.2*  --   HGB 9.5* 10.3* 10.7* 10.6*  HCT 31.0* 33.6* 34.3* 34.1*  MCV 93.4 92.3 92.5 93.2  PLT 265 228 213 024   Basic Metabolic Panel: Recent Labs  Lab 10/29/17 2226 10/30/17 0333 10/31/17 0759 11/01/17 0423  NA 139 142 139 142  K 3.7 3.6 4.3 3.9  CL 105 113* 112* 109  CO2 23 21* 20* 23  GLUCOSE 129* 127* 135* 112*  BUN 24* 20 16 20   CREATININE 1.22 1.14 1.18 1.23  CALCIUM 9.4 8.7* 9.0 9.0  MG  --  2.0  --  2.2    Liver Function Tests: Recent Labs  Lab 10/29/17 2226 10/31/17 0759  AST 20 26  ALT 23 29  ALKPHOS 66 63  BILITOT 0.6 0.6  PROT 5.7* 5.5*  ALBUMIN 2.9* 2.5*   Studies: No results found.  Scheduled Meds: . apixaban  5 mg Oral BID  . atorvastatin  20 mg Oral q1800  . dofetilide  500 mcg Oral BID  . donepezil  10 mg Oral QHS  . DULoxetine  30 mg Oral QHS  . ezetimibe  10 mg Oral Daily  . feeding supplement (ENSURE ENLIVE)  237 mL Oral TID WC  . ferrous sulfate  325 mg Oral BID WC  . folic acid  1 mg Oral QHS  . metoprolol succinate  25 mg Oral Daily  . pantoprazole  40 mg Oral Daily  . potassium chloride  20 mEq Oral Daily  . predniSONE  5 mg Oral Q breakfast  . saccharomyces boulardii  250 mg Oral BID  . sodium chloride flush  3 mL Intravenous Q12H  . sodium chloride flush  3 mL Intravenous Q12H   Continuous  Infusions: . sodium chloride    . cefTRIAXone (ROCEPHIN)  IV Stopped (11/01/17 1109)   PRN Meds: sodium chloride, acetaminophen **OR** acetaminophen, artificial tears, ondansetron **OR** ondansetron (ZOFRAN) IV, oxyCODONE-acetaminophen, sodium chloride flush  Time spent: 35 minutes  Author: Berle Mull, MD [facility] Pager: [phone] 11/01/2017 3:29 PM  If 7PM-7AM, please contact night-coverage at [email or web address], password [facility]

## 2017-11-01 NOTE — Progress Notes (Signed)
Initial Nutrition Assessment  DOCUMENTATION CODES:   Not applicable  INTERVENTION:  Ensure Enlive po TID, each supplement provides 350 kcal and 20 grams of protein  Ordered ice cream with trays for patient  NUTRITION DIAGNOSIS:   Inadequate oral intake related to decreased appetite as evidenced by per patient/family report.  GOAL:   Patient will meet greater than or equal to 90% of their needs  MONITOR:   PO intake, I & O's, Weight trends, Supplement acceptance  REASON FOR ASSESSMENT:   Malnutrition Screening Tool    ASSESSMENT:   Pt. with PMH of chronic atrial fibrillation on Eliquis, history of prostate cancer status post radiation therapy, chronic systolic CHF with EF 20 to 25%, psoriatic arthritis, history of CVA with vascular dementia, and recent bladder irrigation and clot evacuation for gross hematuria attributed to postradiation syndrome. Presented with E-coli bacteremia, sepsis due to urinary retention  Drawn to patient due to positive MST. Spoke with patient and wife at bedside.  Wife reports for the past week patient has not liked food that he normally enjoys. Patient reports taste changes, that seem to correlate with onset of infection.  24 hour recall as follows: Breakfast: Zone bar, parfait with fruit, granola and cereal, coffee Lunch: Sandwich, fruit, cheese and crackers Dinner: Cooked meal, consisting of chicken, starch and vegetables or a big chef's salad Snacks on "anything sweet," and fruit   With one week of not liking the taste of food, patient reports losing weight from 221 pounds to 215 pounds a 6 pound/2.7% severe weight loss for timeframe.  Discussed with patient that taste alterations should go away as the infection resolves. Also discussed that this could be caused by the antibiotics, but that it seems onset of taste alterations was prior to being started on antibiotics. Will continue to monitor. Despite this, PO intake continues to be  50-80%  Patient requested ensure to supplement decreased PO.  Labs reviewed  Medications reviewed and include:  Folic Acid, Iron, 25E+, Prednisone  NUTRITION - FOCUSED PHYSICAL EXAM:    Most Recent Value  Orbital Region  Mild depletion  Upper Arm Region  No depletion  Thoracic and Lumbar Region  No depletion  Buccal Region  No depletion  Temple Region  No depletion  Clavicle Bone Region  No depletion  Clavicle and Acromion Bone Region  No depletion  Scapular Bone Region  No depletion  Dorsal Hand  No depletion  Patellar Region  No depletion  Anterior Thigh Region  No depletion  Posterior Calf Region  No depletion       Diet Order:   Diet Order           Diet Heart Room service appropriate? Yes; Fluid consistency: Thin  Diet effective now          EDUCATION NEEDS:   Not appropriate for education at this time  Skin:  Skin Assessment: Reviewed RN Assessment(closed incision to L chest)  Last BM:  11/01/2017  Height:   Ht Readings from Last 1 Encounters:  10/31/17 6\' 3"  (1.905 m)    Weight:   Wt Readings from Last 1 Encounters:  11/01/17 221 lb 5.5 oz (100.4 kg)    Ideal Body Weight:  89.09 kg  BMI:  Body mass index is 27.67 kg/m.  Estimated Nutritional Needs:   Kcal:  5277-8242 (MSJ x1.3-1.4)  Protein:  130-150 grams (1.3-1.5g/kg)  Fluid:  >2.5L  Christopher Baker. Christopher Pelc, MS, RD LDN Inpatient Clinical Dietitian Pager [phone]

## 2017-11-02 DIAGNOSIS — I5022 Chronic systolic (congestive) heart failure: Secondary | ICD-10-CM

## 2017-11-02 DIAGNOSIS — N390 Urinary tract infection, site not specified: Secondary | ICD-10-CM

## 2017-11-02 DIAGNOSIS — Z8673 Personal history of transient ischemic attack (TIA), and cerebral infarction without residual deficits: Secondary | ICD-10-CM

## 2017-11-02 DIAGNOSIS — F0150 Vascular dementia without behavioral disturbance: Secondary | ICD-10-CM

## 2017-11-02 DIAGNOSIS — I10 Essential (primary) hypertension: Secondary | ICD-10-CM

## 2017-11-02 DIAGNOSIS — A4151 Sepsis due to Escherichia coli [E. coli]: Principal | ICD-10-CM

## 2017-11-02 DIAGNOSIS — I482 Chronic atrial fibrillation: Secondary | ICD-10-CM

## 2017-11-02 DIAGNOSIS — R7881 Bacteremia: Secondary | ICD-10-CM

## 2017-11-02 DIAGNOSIS — A419 Sepsis, unspecified organism: Secondary | ICD-10-CM

## 2017-11-02 LAB — BASIC METABOLIC PANEL
Anion gap: 9 (ref 5–15)
BUN: 19 mg/dL (ref 6–20)
CHLORIDE: 109 mmol/L (ref 101–111)
CO2: 25 mmol/L (ref 22–32)
Calcium: 9.3 mg/dL (ref 8.9–10.3)
Creatinine, Ser: 1.1 mg/dL (ref 0.61–1.24)
GFR calc non Af Amer: >60 mL/min (ref >60)
Glucose, Bld: 115 mg/dL — ABNORMAL HIGH (ref 65–99)
POTASSIUM: 4.3 mmol/L (ref 3.5–5.1)
SODIUM: 143 mmol/L (ref 135–145)

## 2017-11-02 LAB — CBC
HCT: 34.4 % — ABNORMAL LOW (ref 39.0–52.0)
HEMOGLOBIN: 10.4 g/dL — AB (ref 13.0–17.0)
MCH: 28 pg (ref 26.0–34.0)
MCHC: 30.2 g/dL (ref 30.0–36.0)
MCV: 92.5 fL (ref 78.0–100.0)
Platelets: 231 10*3/uL (ref 150–400)
RBC: 3.72 MIL/uL — ABNORMAL LOW (ref 4.22–5.81)
RDW: 15.1 % (ref 11.5–15.5)
WBC: 10.4 10*3/uL (ref 4.0–10.5)

## 2017-11-02 NOTE — Progress Notes (Signed)
PROGRESS NOTE    PER BEAGLEY  XTK:[id number] DOB: 05/26/1934 DOA: 10/29/2017 PCP: Haywood Pao, MD   Brief Narrative:  84 year old with history of atrial fibrillation on Eliquis, history of prostate cancer status post radiation, chronic systolic CHF with ejection fraction 20-25%, psoriatic arthritis, history of CVA with vascular dementia, recent bladder ligation and clot evacuation for gross hematuria who was admitted to the hospital.  He was found to be septic secondary to urinary tract infection and bacteremic from E. coli.  Initially started on broad-spectrum antibiotics and later was transitioned to IV Rocephin.  Patient has repeat blood cultures drawn, results are pending at this time.   Assessment & Plan:   Principal Problem:   Sepsis, unspecified organism [facility]) Active Problems:   Essential hypertension   Atrial fibrillation, chronic (HCC)   PSORIATIC ARTHRITIS   PROSTATE CANCER, HX OF   History of completed stroke   Vascular dementia   OSA (obstructive sleep apnea)   Normocytic anemia   Chronic systolic heart failure (HCC)   Acute urinary retention  Sepsis secondary to E. coli bacteremia from urinary tract infection - Patient was initially started on IV vancomycin and Zosyn.  After obtaining cultures and sensitivities it was transition to IV Rocephin.  Plans to eventually transition to oral Omnicef/Vantin at the time of discharge.   Chronic systolic congestive heart failure, ejection fraction 20-25%.  BiV pacemaker implant -Currently appears to be euvolemic in nature.  We will eventually resume his oral Lasix and lisinopril the time of discharge.  Continue Toprol 25 mg orally daily  Chronic atrial fibrillation on Tikosyn - Continue patient's Eliquis and Tikosyn  Normocytic anemia - Hemoglobin appears to be stable.  Continue iron supplementation twice daily  History of previous CVA with vascular dementia - No new focal neuro deficits.  Continue Aricept, statin  and Eliquis  Psoriatic arthritis -Typically uses methotrexate and low-dose prednisone.  Methotrexate is on hold antibiotics  History of depression -Continue Cymbalta 30 mg at bedtime  Physical therapy recommends home PT  DVT prophylaxis: Subcutaneous heparin Code Status: Full code Family Communication: Wife at bedside Disposition Plan: Discharge in next 24 hours  Consultants:   None  Procedures:   None   Subjective: No complaints this morning.  States overall he feels a lot better.  He was able to ambulate around the hallway with physical therapy but towards the end did feel fatigue.  Review of Systems Otherwise negative except as per HPI, including: General: Denies fever, chills, night sweats or unintended weight loss. Resp: Denies cough, wheezing, shortness of breath. Cardiac: Denies chest pain, palpitations, orthopnea, paroxysmal nocturnal dyspnea. GI: Denies abdominal pain, nausea, vomiting, diarrhea or constipation GU: Denies dysuria, frequency, hesitancy or incontinence MS: Denies muscle aches, joint pain or swelling Neuro: Denies headache, neurologic deficits (focal weakness, numbness, tingling), abnormal gait Psych: Denies anxiety, depression, SI/HI/AVH Skin: Denies new rashes or lesions ID: Denies sick contacts, exotic exposures, travel  Objective: Vitals:   11/01/17 2144 11/01/17 2314 11/02/17 0020 11/02/17 0457  BP: (!) 169/91 (!) 177/97 (!) 132/58 (!) 154/83  Pulse: 82  78 63  Resp: 18   18  Temp: 99.1 F (37.3 C)   98.2 F (36.8 C)  TempSrc: Oral   Oral  SpO2: 95%  94% 98%  Weight:    99.5 kg (219 lb 5.7 oz)  Height:        Intake/Output Summary (Last 24 hours) at 11/02/2017 1120 Last data filed at 11/02/2017 0951 Gross per 24 hour  Intake 600 ml  Output 1400 ml  Net -800 ml   Filed Weights   10/31/17 1254 11/01/17 0500 11/02/17 0457  Weight: 96.2 kg (212 lb) 100.4 kg (221 lb 5.5 oz) 99.5 kg (219 lb 5.7 oz)    Examination:  General exam:  Appears calm and comfortable  Respiratory system: Clear to auscultation. Respiratory effort normal. Cardiovascular system: S1 & S2 heard, RRR. No JVD, murmurs, rubs, gallops or clicks. No pedal edema. Gastrointestinal system: Abdomen is nondistended, soft and nontender. No organomegaly or masses felt. Normal bowel sounds heard. Central nervous system: Alert and oriented. No focal neurological deficits. Extremities: Symmetric 5 x 5 power. Skin: No rashes, lesions or ulcers Psychiatry: Judgement and insight appear normal. Mood & affect appropriate.   Data Reviewed:   CBC: Recent Labs  Lab 10/29/17 2226 10/30/17 0333 10/31/17 0759 11/01/17 0423 11/02/17 0354  WBC 16.4* 15.3* 17.9* 11.9* 10.4  NEUTROABS  --  13.3* 16.2*  --   --   HGB 9.5* 10.3* 10.7* 10.6* 10.4*  HCT 31.0* 33.6* 34.3* 34.1* 34.4*  MCV 93.4 92.3 92.5 93.2 92.5  PLT 265 228 213 223 578   Basic Metabolic Panel: Recent Labs  Lab 10/29/17 2226 10/30/17 0333 10/31/17 0759 11/01/17 0423 11/02/17 0354  NA 139 142 139 142 143  K 3.7 3.6 4.3 3.9 4.3  CL 105 113* 112* 109 109  CO2 23 21* 20* 23 25  GLUCOSE 129* 127* 135* 112* 115*  BUN 24* 20 16 20 19   CREATININE 1.22 1.14 1.18 1.23 1.10  CALCIUM 9.4 8.7* 9.0 9.0 9.3  MG  --  2.0  --  2.2  --    GFR: Estimated Creatinine Clearance: 61.9 mL/min (by C-G formula based on SCr of 1.1 mg/dL). Liver Function Tests: Recent Labs  Lab 10/29/17 2226 10/31/17 0759  AST 20 26  ALT 23 29  ALKPHOS 66 63  BILITOT 0.6 0.6  PROT 5.7* 5.5*  ALBUMIN 2.9* 2.5*   No results for input(s): LIPASE, AMYLASE in the last 168 hours. No results for input(s): AMMONIA in the last 168 hours. Coagulation Profile: No results for input(s): INR, PROTIME in the last 168 hours. Cardiac Enzymes: No results for input(s): CKTOTAL, CKMB, CKMBINDEX, TROPONINI in the last 168 hours. BNP (last 3 results) No results for input(s): PROBNP in the last 8760 hours. HbA1C: No results for input(s):  HGBA1C in the last 72 hours. CBG: No results for input(s): GLUCAP in the last 168 hours. Lipid Profile: No results for input(s): CHOL, HDL, LDLCALC, TRIG, CHOLHDL, LDLDIRECT in the last 72 hours. Thyroid Function Tests: No results for input(s): TSH, T4TOTAL, FREET4, T3FREE, THYROIDAB in the last 72 hours. Anemia Panel: No results for input(s): VITAMINB12, FOLATE, FERRITIN, TIBC, IRON, RETICCTPCT in the last 72 hours. Sepsis Labs: Recent Labs  Lab 10/29/17 2238 10/30/17 0041 10/30/17 0152  LATICACIDVEN 2.55* 1.20 1.8    Recent Results (from the past 240 hour(s))  Blood Culture (routine x 2)     Status: Abnormal   Collection Time: 10/29/17 10:20 PM  Result Value Ref Range Status   Specimen Description BLOOD RIGHT FOREARM  Final   Special Requests   Final    BOTTLES DRAWN AEROBIC AND ANAEROBIC Blood Culture adequate volume   Culture  Setup Time   Final    GRAM NEGATIVE RODS IN BOTH AEROBIC AND ANAEROBIC BOTTLES CRITICAL RESULT CALLED TO, READ BACK BY AND VERIFIED WITH: R RUMBARGER,PHARMD AT 1257 10/30/17 BY L BENFIELD Performed at [facility]  Lab, [address], [place], [place] [address]    Culture ESCHERICHIA COLI (A)  Final   Report Status 11/01/2017 FINAL  Final   Organism ID, Bacteria ESCHERICHIA COLI  Final      Susceptibility   Escherichia coli - MIC*    AMPICILLIN >=32 RESISTANT Resistant     CEFAZOLIN 16 SENSITIVE Sensitive     CEFEPIME <=1 SENSITIVE Sensitive     CEFTAZIDIME <=1 SENSITIVE Sensitive     CEFTRIAXONE <=1 SENSITIVE Sensitive     CIPROFLOXACIN <=0.25 SENSITIVE Sensitive     GENTAMICIN <=1 SENSITIVE Sensitive     IMIPENEM <=0.25 SENSITIVE Sensitive     TRIMETH/SULFA <=20 SENSITIVE Sensitive     AMPICILLIN/SULBACTAM >=32 RESISTANT Resistant     PIP/TAZO <=4 SENSITIVE Sensitive     Extended ESBL NEGATIVE Sensitive     * ESCHERICHIA COLI  Blood Culture ID Panel (Reflexed)     Status: Abnormal   Collection Time: 10/29/17 10:20 PM  Result Value Ref  Range Status   Enterococcus species NOT DETECTED NOT DETECTED Final   Listeria monocytogenes NOT DETECTED NOT DETECTED Final   Staphylococcus species NOT DETECTED NOT DETECTED Final   Staphylococcus aureus NOT DETECTED NOT DETECTED Final   Streptococcus species NOT DETECTED NOT DETECTED Final   Streptococcus agalactiae NOT DETECTED NOT DETECTED Final   Streptococcus pneumoniae NOT DETECTED NOT DETECTED Final   Streptococcus pyogenes NOT DETECTED NOT DETECTED Final   Acinetobacter baumannii NOT DETECTED NOT DETECTED Final   Enterobacteriaceae species DETECTED (A) NOT DETECTED Final    Comment: Enterobacteriaceae represent a large family of gram-negative bacteria, not a single organism. CRITICAL RESULT CALLED TO, READ BACK BY AND VERIFIED WITH: R RUMBARGER,PHARMD AT 1257 10/30/17 BY L BENFIELD    Enterobacter cloacae complex NOT DETECTED NOT DETECTED Final   Escherichia coli DETECTED (A) NOT DETECTED Final    Comment: CRITICAL RESULT CALLED TO, READ BACK BY AND VERIFIED WITH: R RUMBARGER,PHARMD AT 1257 10/30/17 BY L BENFIELD    Klebsiella oxytoca NOT DETECTED NOT DETECTED Final   Klebsiella pneumoniae NOT DETECTED NOT DETECTED Final   Proteus species NOT DETECTED NOT DETECTED Final   Serratia marcescens NOT DETECTED NOT DETECTED Final   Carbapenem resistance NOT DETECTED NOT DETECTED Final   Haemophilus influenzae NOT DETECTED NOT DETECTED Final   Neisseria meningitidis NOT DETECTED NOT DETECTED Final   Pseudomonas aeruginosa NOT DETECTED NOT DETECTED Final   Candida albicans NOT DETECTED NOT DETECTED Final   Candida glabrata NOT DETECTED NOT DETECTED Final   Candida krusei NOT DETECTED NOT DETECTED Final   Candida parapsilosis NOT DETECTED NOT DETECTED Final   Candida tropicalis NOT DETECTED NOT DETECTED Final    Comment: Performed at [facility], [place]. [address], [place], [place] [address]  Blood Culture (routine x 2)     Status: None (Preliminary result)   Collection Time:  10/29/17 10:25 PM  Result Value Ref Range Status   Specimen Description BLOOD RIGHT ANTECUBITAL  Final   Special Requests   Final    BOTTLES DRAWN AEROBIC AND ANAEROBIC Blood Culture adequate volume   Culture   Final    NO GROWTH 3 DAYS Performed at [facility], [place] [address], [place],  [address]    Report Status PENDING  Incomplete  Urine culture     Status: Abnormal   Collection Time: 10/30/17 12:45 AM  Result Value Ref Range Status   Specimen Description URINE, CLEAN CATCH  Final   Special Requests  Final    NONE Performed at [facility], [place] [address] [place], [place] [address]    Culture 80,000 COLONIES/mL ESCHERICHIA COLI (A)  Final   Report Status 11/01/2017 FINAL  Final   Organism ID, Bacteria ESCHERICHIA COLI (A)  Final      Susceptibility   Escherichia coli - MIC*    AMPICILLIN >=32 RESISTANT Resistant     CEFAZOLIN 16 SENSITIVE Sensitive     CEFTRIAXONE <=1 SENSITIVE Sensitive     CIPROFLOXACIN <=0.25 SENSITIVE Sensitive     GENTAMICIN <=1 SENSITIVE Sensitive     IMIPENEM <=0.25 SENSITIVE Sensitive     NITROFURANTOIN <=16 SENSITIVE Sensitive     TRIMETH/SULFA <=20 SENSITIVE Sensitive     AMPICILLIN/SULBACTAM >=32 RESISTANT Resistant     PIP/TAZO <=4 SENSITIVE Sensitive     Extended ESBL NEGATIVE Sensitive     * 80,000 COLONIES/mL ESCHERICHIA COLI     Radiology Studies: No results found.  Scheduled Meds: . apixaban  5 mg Oral BID  . atorvastatin  20 mg Oral q1800  . dofetilide  500 mcg Oral BID  . donepezil  10 mg Oral QHS  . DULoxetine  30 mg Oral QHS  . ezetimibe  10 mg Oral Daily  . feeding supplement (ENSURE ENLIVE)  237 mL Oral TID WC  . ferrous sulfate  325 mg Oral BID WC  . folic acid  1 mg Oral QHS  . metoprolol succinate  25 mg Oral Daily  . pantoprazole  40 mg Oral Daily  . potassium chloride  20 mEq Oral Daily  . predniSONE  5 mg Oral Q breakfast  . saccharomyces boulardii  250 mg Oral BID  . sodium chloride flush  3  mL Intravenous Q12H  . sodium chloride flush  3 mL Intravenous Q12H   Continuous Infusions: . sodium chloride    . cefTRIAXone (ROCEPHIN)  IV 2 g (11/02/17 0901)     LOS: 3 days    I have spent 35 minutes face to face with the patient and on the ward discussing the patients care, assessment, plan and disposition with other care givers. >50% of the time was devoted counseling the patient about the risks and benefits of treatment and coordinating care.     Taci Sterling Arsenio Loader, MD [facility] Pager [phone]   If 7PM-7AM, please contact night-coverage [email or web address] Password [email or web address] 11/02/2017, 11:20 AM

## 2017-11-02 NOTE — Progress Notes (Signed)
Physical Therapy Treatment Patient Details Name: Christopher Baker MRN: [id number] DOB: February 29, 1934 Today's Date: 11/02/2017    History of Present Illness Pt. with PMH of chronic atrial fibrillation on Eliquis, history of prostate cancer status post radiation therapy, chronic systolic CHF with EF 20 to 25%, psoriatic arthritis, history of CVA with vascular dementia, and recent bladder irrigation and clot evacuation for gross hematuria attributed to postradiation syndrome; admitted on 10/29/2017, presented with complaint of fever and urine retention, was found to have sepsis due to E. coli.    PT Comments    Patient progressing with ambulation distance.  HR up to 146 after ambulation, but quickly returned to 110's.  Feel he is progressing and safe for d/c home with wife assist and follow up HHPT.  Follow Up Recommendations  Home health PT;Supervision/Assistance - 24 hour     Equipment Recommendations  3in1 (PT)    Recommendations for Other Services       Precautions / Restrictions Precautions Precautions: Fall    Mobility  Bed Mobility Overal bed mobility: Needs Assistance Bed Mobility: Supine to Sit     Supine to sit: HOB elevated;Supervision     General bed mobility comments: increased time  Transfers Overall transfer level: Needs assistance Equipment used: Rolling walker (2 wheeled) Transfers: Sit to/from Stand Sit to Stand: Min guard         General transfer comment: with increased time and effort  Ambulation/Gait Ambulation/Gait assistance: Min guard;Min assist Ambulation Distance (Feet): 260 Feet Assistive device: Rolling walker (2 wheeled) Gait Pattern/deviations: Step-through pattern;Trunk flexed;Decreased stride length;Shuffle     General Gait Details: cues for posture throughout, assist due to fatigue and increased issues with balance   Stairs             Wheelchair Mobility    Modified Rankin (Stroke Patients Only)       Balance Overall  balance assessment: Needs assistance         Standing balance support: Bilateral upper extremity supported Standing balance-Leahy Scale: Poor Standing balance comment: washes hands leaning down on elbows                            Cognition Arousal/Alertness: Awake/alert Behavior During Therapy: WFL for tasks assessed/performed Overall Cognitive Status: History of cognitive impairments - at baseline                                        Exercises      General Comments General comments (skin integrity, edema, etc.): toileted in bathroom, able to stand on his own with grabbar use      Pertinent Vitals/Pain Faces Pain Scale: Hurts even more Pain Location: back pain chronic Pain Descriptors / Indicators: Constant Pain Intervention(s): Repositioned;Monitored during session    Home Living                      Prior Function            PT Goals (current goals can now be found in the care plan section) Progress towards PT goals: Progressing toward goals    Frequency    Min 3X/week      PT Plan Current plan remains appropriate    Co-evaluation              AM-PAC PT "6 Clicks" Daily Activity  Outcome  Measure  Difficulty turning over in bed (including adjusting bedclothes, sheets and blankets)?: A Little Difficulty moving from lying on back to sitting on the side of the bed? : A Lot Difficulty sitting down on and standing up from a chair with arms (e.g., wheelchair, bedside commode, etc,.)?: A Lot Help needed moving to and from a bed to chair (including a wheelchair)?: A Little Help needed walking in hospital room?: A Little Help needed climbing 3-5 steps with a railing? : A Lot 6 Click Score: 15    End of Session Equipment Utilized During Treatment: Gait belt Activity Tolerance: Patient tolerated treatment well Patient left: with call bell/phone within reach;in chair;with chair alarm set;with family/visitor present    PT Visit Diagnosis: Other abnormalities of gait and mobility (R26.89);Muscle weakness (generalized) (M62.81)     Time: 3552-1747 PT Time Calculation (min) (ACUTE ONLY): 46 min  Charges:  $Gait Training: 23-37 mins $Therapeutic Activity: 8-22 mins                    G CodesMagda Kiel, [place] [phone] 11/02/2017    Reginia Naas 11/02/2017, 12:17 PM

## 2017-11-03 LAB — CBC
HEMATOCRIT: 36.1 % — AB (ref 39.0–52.0)
HEMOGLOBIN: 11 g/dL — AB (ref 13.0–17.0)
MCH: 28.5 pg (ref 26.0–34.0)
MCHC: 30.5 g/dL (ref 30.0–36.0)
MCV: 93.5 fL (ref 78.0–100.0)
PLATELETS: 240 10*3/uL (ref 150–400)
RBC: 3.86 MIL/uL — AB (ref 4.22–5.81)
RDW: 15.3 % (ref 11.5–15.5)
WBC: 10.5 10*3/uL (ref 4.0–10.5)

## 2017-11-03 LAB — BASIC METABOLIC PANEL
ANION GAP: 11 (ref 5–15)
BUN: 17 mg/dL (ref 6–20)
CHLORIDE: 110 mmol/L (ref 101–111)
CO2: 24 mmol/L (ref 22–32)
Calcium: 9.4 mg/dL (ref 8.9–10.3)
Creatinine, Ser: 0.96 mg/dL (ref 0.61–1.24)
GFR calc non Af Amer: >60 mL/min (ref >60)
Glucose, Bld: 107 mg/dL — ABNORMAL HIGH (ref 65–99)
POTASSIUM: 4.2 mmol/L (ref 3.5–5.1)
SODIUM: 145 mmol/L (ref 135–145)

## 2017-11-03 LAB — CULTURE, BLOOD (ROUTINE X 2)
CULTURE: NO GROWTH
Special Requests: ADEQUATE

## 2017-11-03 LAB — MAGNESIUM: MAGNESIUM: 2.3 mg/dL (ref 1.7–2.4)

## 2017-11-03 MED ORDER — CEFDINIR 300 MG PO CAPS: 300.0000 mg | ORAL_CAPSULE | Freq: Two times a day (BID) | ORAL | 0 refills | Status: AC

## 2017-11-03 NOTE — Care Management Important Message (Signed)
Important Message  Patient Details  Name: VARIAN INNES MRN: [id number] Date of Birth: 10-Mar-1934   Medicare Important Message Given:       Orbie Pyo 11/03/2017, 3:26 PM

## 2017-11-03 NOTE — Care Management Note (Signed)
Case Management Note  Patient Details  Name: Christopher Baker MRN: [id number] Date of Birth: 20-Aug-1933  Subjective/Objective:                  Sepsis due to E. coli bacteremia.  PCP: Domenick Gong  Action/Plan: Transition to home today with home health services.  Wife to provide transportation to home.  Expected Discharge Date:  11/03/17               Expected Discharge Plan:  [place]  In-House Referral:     Discharge planning Services  CM Consult  Post Acute Care Choice:    Choice offered to:  Patient  DME Arranged:  3-N-1 DME Agency:  [place]:  PT [facility] Agency:  [place]  Status of Service:  Completed, signed off  If discussed at [place] of Stay Meetings, dates discussed:    Additional Comments:  Sharin Mons, RN 11/03/2017, 11:30 AM

## 2017-11-03 NOTE — Discharge Summary (Signed)
Physician Discharge Summary  Christopher Baker RXV:[id number] DOB: Nov 10, 1933 DOA: 10/29/2017  PCP: Haywood Pao, MD  Admit date: 10/29/2017 Discharge date: 11/03/2017  Admitted From: Home  Disposition:  Home   Recommendations for Outpatient Follow-up:  1. Follow up with PCP in 1 weeks 2. Please obtain BMP/CBC in one week your next doctors visit.  3. Home with Home PT 4. Take Omnicef orally for 7 days  5. Follow-up with outpatient cardiology in 2-4 weeks  Home Health: PT Equipment/Devices: None  Discharge Condition: Stable CODE STATUS: Full  Diet recommendation: Heart Healthy  Brief/Interim Summary: 84 year old with history of atrial fibrillation on Eliquis, history of prostate cancer status post radiation, chronic systolic CHF with ejection fraction 20-25%, psoriatic arthritis, history of CVA with vascular dementia, recent bladder ligation and clot evacuation for gross hematuria who was admitted to the hospital.  He was found to be septic secondary to urinary tract infection and bacteremic from E. coli.  Initially started on broad-spectrum antibiotics and later was transitioned to IV Rocephin.  Patient has repeat blood cultures drawn, which also remain negative.  Eventually IV Rocephin was changed to oral Omnicef to be taken for 7 days at home. Due to some generalized weakness patient was evaluated by home PT who recommended he would benefit from home PT.  Arrangements were made with the help of case manager. Patient has reached maximum benefit from hospital stay today and stable to be discharged with outpatient follow-up recommendations as stated above.  Discharge Diagnoses:  Principal Problem:   Sepsis, unspecified organism [facility]) Active Problems:   Essential hypertension   Atrial fibrillation, chronic (HCC)   PSORIATIC ARTHRITIS   PROSTATE CANCER, HX OF   History of completed stroke   Vascular dementia   OSA (obstructive sleep apnea)   Normocytic anemia   Chronic systolic  heart failure (HCC)   Acute urinary retention   Sepsis secondary to E. coli bacteremia from urinary tract infection; improved - Initially started on vancomycin and Zosyn which was later transitioned to IV Rocephin once the culture sensitivities were available.  Plan to discharge patient on 7 days of oral Omnicef.  Will need to follow-up with outpatient primary care physician in 7 days. On the day of discharge repeat blood cultures remain negative and patient remains afebrile for greater than 24 hours.  Chronic systolic congestive heart failure, ejection fraction 20-25%.  BiV pacemaker implant -Patient currently appears to be euvolemic in nature.  Will resume home medications including lisinopril, Lasix and Toprol.  Chronic atrial fibrillation on Tikosyn - Continue patient's Eliquis and Tikosyn  Normocytic anemia - Hemoglobin appears to be stable.  Continue iron supplementation twice daily  History of previous CVA with vascular dementia - No new focal neuro deficits.  Continue Aricept, statin and Eliquis  Psoriatic arthritis -Typically uses methotrexate and low-dose prednisone.    History of depression -Continue Cymbalta 30 mg at bedtime  Generalized weakness -Evaluated by physical therapy will recommend patient would benefit from home PT  Patient was on Eliquis for DVT prophylaxis while in the hospital He is a full code  Discharge Instructions   Allergies as of 11/03/2017      Reactions   Ciprofloxacin Nausea Only, Other (See Comments)   Stomach ache   Hydrocodone-acetaminophen Other (See Comments)   Hallucinations in higher doses   Other Other (See Comments)   Rash from neoprene wrap after knee surgery   Shellfish Allergy Nausea And Vomiting, Other (See Comments)   Reaction to scallops  Tramadol Other (See Comments)   Pt becomes combative per wife      Medication List    TAKE these medications   acetaminophen 500 MG tablet Commonly known as:  TYLENOL Take  1,000 mg by mouth 2 (two) times daily.   amoxicillin 500 MG capsule Commonly known as:  AMOXIL Take 2,000 mg by mouth See admin instructions. Take 2000 mg by mouth one hour prior to dental apointments   atorvastatin 20 MG tablet Commonly known as:  LIPITOR Take 20 mg by mouth daily.   cefdinir 300 MG capsule Commonly known as:  OMNICEF Take 1 capsule (300 mg total) by mouth 2 (two) times daily for 7 days.   cyanocobalamin 1000 MCG/ML injection Commonly known as:  (VITAMIN B-12) Inject 1,000 mcg into the muscle every 30 (thirty) days.   dofetilide 500 MCG capsule Commonly known as:  TIKOSYN Take 1 capsule (500 mcg total) by mouth 2 (two) times daily.   donepezil 10 MG tablet Commonly known as:  ARICEPT TAKE ONE TABLET BY MOUTH EVERY NIGHT AT BEDTIME   DULoxetine 30 MG capsule Commonly known as:  CYMBALTA Take 1 capsule (30 mg total) by mouth daily. What changed:  when to take this   ELIQUIS 5 MG Tabs tablet Generic drug:  apixaban TAKE 1 TABLET (5 MG TOTAL) BY MOUTH 2 (TWO) TIMES DAILY.   ezetimibe 10 MG tablet Commonly known as:  ZETIA Take 1 tablet (10 mg total) by mouth daily.   ferrous sulfate 325 (65 FE) MG tablet Take 325 mg by mouth 2 (two) times daily with a meal.   folic acid 1 MG tablet Commonly known as:  FOLVITE Take 1 mg by mouth at bedtime.   furosemide 40 MG tablet Commonly known as:  LASIX Take 80 mg by mouth daily.   lisinopril 5 MG tablet Commonly known as:  PRINIVIL,ZESTRIL TAKE ONE TABLET BY MOUTH DAILY What changed:    how much to take  how to take this  when to take this   loperamide 2 MG tablet Commonly known as:  IMODIUM A-D Take 2 mg by mouth 3 (three) times daily as needed for diarrhea or loose stools.   LUBRICANT EYE OP Place 2 drops into both eyes daily as needed (for dry eyes).   methotrexate 2.5 MG tablet Commonly known as:  RHEUMATREX Take 15 mg by mouth every Monday.   metoprolol succinate 25 MG 24 hr  tablet Commonly known as:  TOPROL XL Take 1 tablet (25 mg total) by mouth daily. What changed:  when to take this   oxybutynin 5 MG tablet Commonly known as:  DITROPAN Take 5 mg by mouth 2 (two) times daily.   oxyCODONE-acetaminophen 10-325 MG tablet Commonly known as:  PERCOCET Take 0.25-0.5 tablets by mouth 2 (two) times daily as needed for pain.   pantoprazole 40 MG tablet Commonly known as:  PROTONIX Take 1 tablet (40 mg total) by mouth daily. What changed:  when to take this   predniSONE 5 MG tablet Commonly known as:  DELTASONE Take 5 mg by mouth daily.   Vitamin D (Ergocalciferol) 50000 units Caps capsule Commonly known as:  DRISDOL Take 50,000 Units by mouth every Sunday.            Durable Medical Equipment  (From admission, onward)        Start     Ordered   11/03/17 1121  For home use only DME 3 n 1  Once  11/03/17 1120     Follow-up Information    Tisovec, Fransico Him, MD. Schedule an appointment as soon as possible for a visit in 1 week(s).   Specialty:  Internal Medicine Contact information: [place] [address] [phone]        Evans Lance, MD. Schedule an appointment as soon as possible for a visit in 4 week(s).   Specialty:  Cardiology Contact information: [address] [place] [address] [place] Follow up.   Why:  Bedside commode will be delivered to room prior to discharge Contact information: [place] [address] [phone]        [facility] Follow up.   Specialty:  Home Health Services Why:  home health services arranged Contact information: [place] [address] [phone]          Allergies  Allergen Reactions  . Ciprofloxacin Nausea Only and Other (See Comments)    Stomach ache  . Hydrocodone-Acetaminophen Other (See Comments)    Hallucinations in  higher doses  . Other Other (See Comments)    Rash from neoprene wrap after knee surgery  . Shellfish Allergy Nausea And Vomiting and Other (See Comments)    Reaction to scallops  . Tramadol Other (See Comments)    Pt becomes combative per wife    You were cared for by a hospitalist during your hospital stay. If you have any questions about your discharge medications or the care you received while you were in the hospital after you are discharged, you can call the unit and asked to speak with the hospitalist on call if the hospitalist that took care of you is not available. Once you are discharged, your primary care physician will handle any further medical issues. Please note that no refills for any discharge medications will be authorized once you are discharged, as it is imperative that you return to your primary care physician (or establish a relationship with a primary care physician if you do not have one) for your aftercare needs so that they can reassess your need for medications and monitor your lab values.  Consultations:  None   Procedures/Studies: Dg Chest Port 1 View  Result Date: 10/29/2017 CLINICAL DATA:  Fever and altered mental status EXAM: PORTABLE CHEST 1 VIEW COMPARISON:  09/08/2017 FINDINGS: Cardiac shadow is within normal limits. Pacing device is again seen and stable. The lungs are well aerated bilaterally. No focal infiltrate or sizable effusion is seen. No acute bony abnormality is noted. Postsurgical changes in the proximal right shoulder are noted. IMPRESSION: No acute abnormality seen. Electronically Signed   By: Inez Catalina M.D.   On: 10/29/2017 22:11      Subjective: No complaints this morning.  Remains afebrile overnight.    HEENT/EYES = negative for pain, redness, loss of vision, double vision, blurred vision, loss of hearing, sore throat, hoarseness, dysphagia Cardiovascular= negative for chest pain, palpitation, murmurs, lower extremity  swelling Respiratory/lungs= negative for shortness of breath, cough, hemoptysis, wheezing, mucus production Gastrointestinal= negative for nausea, vomiting,, abdominal pain, melena, hematemesis Genitourinary= negative for Dysuria, Hematuria, Change in Urinary Frequency MSK = Negative for arthralgia, myalgias, Back Pain, Joint swelling  Neurology= Negative for headache, seizures, numbness, tingling  Psychiatry= Negative for anxiety, depression, suicidal and homocidal ideation Allergy/Immunology= Medication/Food allergy as listed  Skin= Negative for Rash, lesions, ulcers, itching   Discharge  Exam: Vitals:   11/02/17 2100 11/03/17 0500  BP: (!) 158/70 (!) 148/77  Pulse: 78 73  Resp: 18 18  Temp: 99 F (37.2 C)   SpO2: 97%    Vitals:   11/02/17 0457 11/02/17 1311 11/02/17 2100 11/03/17 0500  BP: (!) 154/83 131/63 (!) 158/70 (!) 148/77  Pulse: 63 75 78 73  Resp: 18 18 18 18   Temp: 98.2 F (36.8 C) 98.1 F (36.7 C) 99 F (37.2 C)   TempSrc: Oral Oral Oral   SpO2: 98% 97% 97%   Weight: 99.5 kg (219 lb 5.7 oz)     Height:        General: Pt is alert, awake, not in acute distress Cardiovascular: RRR, S1/S2 +, no rubs, no gallops Respiratory: CTA bilaterally, no wheezing, no rhonchi Abdominal: Soft, NT, ND, bowel sounds + Extremities: no edema, no cyanosis    The results of significant diagnostics from this hospitalization (including imaging, microbiology, ancillary and laboratory) are listed below for reference.     Microbiology: Recent Results (from the past 240 hour(s))  Blood Culture (routine x 2)     Status: Abnormal   Collection Time: 10/29/17 10:20 PM  Result Value Ref Range Status   Specimen Description BLOOD RIGHT FOREARM  Final   Special Requests   Final    BOTTLES DRAWN AEROBIC AND ANAEROBIC Blood Culture adequate volume   Culture  Setup Time   Final    GRAM NEGATIVE RODS IN BOTH AEROBIC AND ANAEROBIC BOTTLES CRITICAL RESULT CALLED TO, READ BACK BY AND  VERIFIED WITH: R RUMBARGER,PHARMD AT 1257 10/30/17 BY L BENFIELD Performed at [facility], [place] [address], [place], [place] [address]    Culture ESCHERICHIA COLI (A)  Final   Report Status 11/01/2017 FINAL  Final   Organism ID, Bacteria ESCHERICHIA COLI  Final      Susceptibility   Escherichia coli - MIC*    AMPICILLIN >=32 RESISTANT Resistant     CEFAZOLIN 16 SENSITIVE Sensitive     CEFEPIME <=1 SENSITIVE Sensitive     CEFTAZIDIME <=1 SENSITIVE Sensitive     CEFTRIAXONE <=1 SENSITIVE Sensitive     CIPROFLOXACIN <=0.25 SENSITIVE Sensitive     GENTAMICIN <=1 SENSITIVE Sensitive     IMIPENEM <=0.25 SENSITIVE Sensitive     TRIMETH/SULFA <=20 SENSITIVE Sensitive     AMPICILLIN/SULBACTAM >=32 RESISTANT Resistant     PIP/TAZO <=4 SENSITIVE Sensitive     Extended ESBL NEGATIVE Sensitive     * ESCHERICHIA COLI  Blood Culture ID Panel (Reflexed)     Status: Abnormal   Collection Time: 10/29/17 10:20 PM  Result Value Ref Range Status   Enterococcus species NOT DETECTED NOT DETECTED Final   Listeria monocytogenes NOT DETECTED NOT DETECTED Final   Staphylococcus species NOT DETECTED NOT DETECTED Final   Staphylococcus aureus NOT DETECTED NOT DETECTED Final   Streptococcus species NOT DETECTED NOT DETECTED Final   Streptococcus agalactiae NOT DETECTED NOT DETECTED Final   Streptococcus pneumoniae NOT DETECTED NOT DETECTED Final   Streptococcus pyogenes NOT DETECTED NOT DETECTED Final   Acinetobacter baumannii NOT DETECTED NOT DETECTED Final   Enterobacteriaceae species DETECTED (A) NOT DETECTED Final    Comment: Enterobacteriaceae represent a large family of gram-negative bacteria, not a single organism. CRITICAL RESULT CALLED TO, READ BACK BY AND VERIFIED WITH: R RUMBARGER,PHARMD AT 1257 10/30/17 BY L BENFIELD    Enterobacter cloacae complex NOT DETECTED NOT DETECTED Final   Escherichia coli DETECTED (A) NOT DETECTED Final  Comment: CRITICAL RESULT CALLED TO, READ BACK BY AND  VERIFIED WITH: R RUMBARGER,PHARMD AT 1257 10/30/17 BY L BENFIELD    Klebsiella oxytoca NOT DETECTED NOT DETECTED Final   Klebsiella pneumoniae NOT DETECTED NOT DETECTED Final   Proteus species NOT DETECTED NOT DETECTED Final   Serratia marcescens NOT DETECTED NOT DETECTED Final   Carbapenem resistance NOT DETECTED NOT DETECTED Final   Haemophilus influenzae NOT DETECTED NOT DETECTED Final   Neisseria meningitidis NOT DETECTED NOT DETECTED Final   Pseudomonas aeruginosa NOT DETECTED NOT DETECTED Final   Candida albicans NOT DETECTED NOT DETECTED Final   Candida glabrata NOT DETECTED NOT DETECTED Final   Candida krusei NOT DETECTED NOT DETECTED Final   Candida parapsilosis NOT DETECTED NOT DETECTED Final   Candida tropicalis NOT DETECTED NOT DETECTED Final    Comment: Performed at [facility], [place]. [address] [place], [place] [address]  Blood Culture (routine x 2)     Status: None   Collection Time: 10/29/17 10:25 PM  Result Value Ref Range Status   Specimen Description BLOOD RIGHT ANTECUBITAL  Final   Special Requests   Final    BOTTLES DRAWN AEROBIC AND ANAEROBIC Blood Culture adequate volume   Culture   Final    NO GROWTH 5 DAYS Performed at [facility], [place] [address], [place], [place] [address]    Report Status 11/03/2017 FINAL  Final  Urine culture     Status: Abnormal   Collection Time: 10/30/17 12:45 AM  Result Value Ref Range Status   Specimen Description URINE, CLEAN CATCH  Final   Special Requests   Final    NONE Performed at [facility], [place] [address] [place], [place] [address]    Culture 80,000 COLONIES/mL ESCHERICHIA COLI (A)  Final   Report Status 11/01/2017 FINAL  Final   Organism ID, Bacteria ESCHERICHIA COLI (A)  Final      Susceptibility   Escherichia coli - MIC*    AMPICILLIN >=32 RESISTANT Resistant     CEFAZOLIN 16 SENSITIVE Sensitive     CEFTRIAXONE <=1 SENSITIVE Sensitive     CIPROFLOXACIN <=0.25 SENSITIVE Sensitive      GENTAMICIN <=1 SENSITIVE Sensitive     IMIPENEM <=0.25 SENSITIVE Sensitive     NITROFURANTOIN <=16 SENSITIVE Sensitive     TRIMETH/SULFA <=20 SENSITIVE Sensitive     AMPICILLIN/SULBACTAM >=32 RESISTANT Resistant     PIP/TAZO <=4 SENSITIVE Sensitive     Extended ESBL NEGATIVE Sensitive     * 80,000 COLONIES/mL ESCHERICHIA COLI  Culture, blood (routine x 2)     Status: None (Preliminary result)   Collection Time: 11/01/17  4:15 AM  Result Value Ref Range Status   Specimen Description BLOOD LEFT ANTECUBITAL  Final   Special Requests   Final    BOTTLES DRAWN AEROBIC AND ANAEROBIC Blood Culture adequate volume   Culture   Final    NO GROWTH 2 DAYS Performed at [facility], [address], [place], [place] [address]    Report Status PENDING  Incomplete  Culture, blood (routine x 2)     Status: None (Preliminary result)   Collection Time: 11/01/17  4:23 AM  Result Value Ref Range Status   Specimen Description BLOOD LEFT ANTECUBITAL  Final   Special Requests   Final    BOTTLES DRAWN AEROBIC AND ANAEROBIC Blood Culture adequate volume   Culture   Final    NO GROWTH 2 DAYS Performed at [facility], [address] [place], [place] [address]    Report Status PENDING  Incomplete     Labs: BNP (last 3 results) No results for input(s): BNP in the last 8760 hours. Basic Metabolic Panel: Recent Labs  Lab 10/30/17 0333 10/31/17 0759 11/01/17 0423 11/02/17 0354 11/03/17 0509  NA 142 139 142 143 145  K 3.6 4.3 3.9 4.3 4.2  CL 113* 112* 109 109 110  CO2 21* 20* 23 25 24   GLUCOSE 127* 135* 112* 115* 107*  BUN 20 16 20 19 17   CREATININE 1.14 1.18 1.23 1.10 0.96  CALCIUM 8.7* 9.0 9.0 9.3 9.4  MG 2.0  --  2.2  --  2.3   Liver Function Tests: Recent Labs  Lab 10/29/17 2226 10/31/17 0759  AST 20 26  ALT 23 29  ALKPHOS 66 63  BILITOT 0.6 0.6  PROT 5.7* 5.5*  ALBUMIN 2.9* 2.5*   No results for input(s): LIPASE, AMYLASE in the last 168 hours. No results for  input(s): AMMONIA in the last 168 hours. CBC: Recent Labs  Lab 10/30/17 0333 10/31/17 0759 11/01/17 0423 11/02/17 0354 11/03/17 0509  WBC 15.3* 17.9* 11.9* 10.4 10.5  NEUTROABS 13.3* 16.2*  --   --   --   HGB 10.3* 10.7* 10.6* 10.4* 11.0*  HCT 33.6* 34.3* 34.1* 34.4* 36.1*  MCV 92.3 92.5 93.2 92.5 93.5  PLT 228 213 223 231 240   Cardiac Enzymes: No results for input(s): CKTOTAL, CKMB, CKMBINDEX, TROPONINI in the last 168 hours. BNP: Invalid input(s): POCBNP CBG: No results for input(s): GLUCAP in the last 168 hours. D-Dimer No results for input(s): DDIMER in the last 72 hours. Hgb A1c No results for input(s): HGBA1C in the last 72 hours. Lipid Profile No results for input(s): CHOL, HDL, LDLCALC, TRIG, CHOLHDL, LDLDIRECT in the last 72 hours. Thyroid function studies No results for input(s): TSH, T4TOTAL, T3FREE, THYROIDAB in the last 72 hours.  Invalid input(s): FREET3 Anemia work up No results for input(s): VITAMINB12, FOLATE, FERRITIN, TIBC, IRON, RETICCTPCT in the last 72 hours. Urinalysis    Component Value Date/Time   COLORURINE YELLOW 10/29/2017 2226   APPEARANCEUR CLOUDY (A) 10/29/2017 2226   LABSPEC 1.014 10/29/2017 2226   LABSPEC 1.010 01/19/2006 1347   PHURINE 5.0 10/29/2017 2226   GLUCOSEU NEGATIVE 10/29/2017 2226   HGBUR MODERATE (A) 10/29/2017 2226   HGBUR 2+ 04/13/2010 0800   BILIRUBINUR NEGATIVE 10/29/2017 2226   BILIRUBINUR Negative 01/19/2006 1347   KETONESUR NEGATIVE 10/29/2017 2226   PROTEINUR 100 (A) 10/29/2017 2226   UROBILINOGEN 1.0 12/11/2014 1349   NITRITE POSITIVE (A) 10/29/2017 2226   LEUKOCYTESUR LARGE (A) 10/29/2017 2226   LEUKOCYTESUR Small 01/19/2006 1347   Sepsis Labs Invalid input(s): PROCALCITONIN,  WBC,  LACTICIDVEN Microbiology Recent Results (from the past 240 hour(s))  Blood Culture (routine x 2)     Status: Abnormal   Collection Time: 10/29/17 10:20 PM  Result Value Ref Range Status   Specimen Description BLOOD RIGHT  FOREARM  Final   Special Requests   Final    BOTTLES DRAWN AEROBIC AND ANAEROBIC Blood Culture adequate volume   Culture  Setup Time   Final    GRAM NEGATIVE RODS IN BOTH AEROBIC AND ANAEROBIC BOTTLES CRITICAL RESULT CALLED TO, READ BACK BY AND VERIFIED WITH: R RUMBARGER,PHARMD AT 1257 10/30/17 BY L BENFIELD Performed at [facility], [place] [address], [place], [place] [address]    Culture ESCHERICHIA COLI (A)  Final   Report Status 11/01/2017  FINAL  Final   Organism ID, Bacteria ESCHERICHIA COLI  Final      Susceptibility   Escherichia coli - MIC*    AMPICILLIN >=32 RESISTANT Resistant     CEFAZOLIN 16 SENSITIVE Sensitive     CEFEPIME <=1 SENSITIVE Sensitive     CEFTAZIDIME <=1 SENSITIVE Sensitive     CEFTRIAXONE <=1 SENSITIVE Sensitive     CIPROFLOXACIN <=0.25 SENSITIVE Sensitive     GENTAMICIN <=1 SENSITIVE Sensitive     IMIPENEM <=0.25 SENSITIVE Sensitive     TRIMETH/SULFA <=20 SENSITIVE Sensitive     AMPICILLIN/SULBACTAM >=32 RESISTANT Resistant     PIP/TAZO <=4 SENSITIVE Sensitive     Extended ESBL NEGATIVE Sensitive     * ESCHERICHIA COLI  Blood Culture ID Panel (Reflexed)     Status: Abnormal   Collection Time: 10/29/17 10:20 PM  Result Value Ref Range Status   Enterococcus species NOT DETECTED NOT DETECTED Final   Listeria monocytogenes NOT DETECTED NOT DETECTED Final   Staphylococcus species NOT DETECTED NOT DETECTED Final   Staphylococcus aureus NOT DETECTED NOT DETECTED Final   Streptococcus species NOT DETECTED NOT DETECTED Final   Streptococcus agalactiae NOT DETECTED NOT DETECTED Final   Streptococcus pneumoniae NOT DETECTED NOT DETECTED Final   Streptococcus pyogenes NOT DETECTED NOT DETECTED Final   Acinetobacter baumannii NOT DETECTED NOT DETECTED Final   Enterobacteriaceae species DETECTED (A) NOT DETECTED Final    Comment: Enterobacteriaceae represent a large family of gram-negative bacteria, not a single organism. CRITICAL RESULT CALLED TO, READ BACK  BY AND VERIFIED WITH: R RUMBARGER,PHARMD AT 1257 10/30/17 BY L BENFIELD    Enterobacter cloacae complex NOT DETECTED NOT DETECTED Final   Escherichia coli DETECTED (A) NOT DETECTED Final    Comment: CRITICAL RESULT CALLED TO, READ BACK BY AND VERIFIED WITH: R RUMBARGER,PHARMD AT 1257 10/30/17 BY L BENFIELD    Klebsiella oxytoca NOT DETECTED NOT DETECTED Final   Klebsiella pneumoniae NOT DETECTED NOT DETECTED Final   Proteus species NOT DETECTED NOT DETECTED Final   Serratia marcescens NOT DETECTED NOT DETECTED Final   Carbapenem resistance NOT DETECTED NOT DETECTED Final   Haemophilus influenzae NOT DETECTED NOT DETECTED Final   Neisseria meningitidis NOT DETECTED NOT DETECTED Final   Pseudomonas aeruginosa NOT DETECTED NOT DETECTED Final   Candida albicans NOT DETECTED NOT DETECTED Final   Candida glabrata NOT DETECTED NOT DETECTED Final   Candida krusei NOT DETECTED NOT DETECTED Final   Candida parapsilosis NOT DETECTED NOT DETECTED Final   Candida tropicalis NOT DETECTED NOT DETECTED Final    Comment: Performed at [facility], [place]. [address], [place], [place] [address]  Blood Culture (routine x 2)     Status: None   Collection Time: 10/29/17 10:25 PM  Result Value Ref Range Status   Specimen Description BLOOD RIGHT ANTECUBITAL  Final   Special Requests   Final    BOTTLES DRAWN AEROBIC AND ANAEROBIC Blood Culture adequate volume   Culture   Final    NO GROWTH 5 DAYS Performed at [facility], [place] [address] [place], [place] [address]    Report Status 11/03/2017 FINAL  Final  Urine culture     Status: Abnormal   Collection Time: 10/30/17 12:45 AM  Result Value Ref Range Status   Specimen Description URINE, CLEAN CATCH  Final   Special Requests   Final    NONE Performed at [facility], [place] [address], [place], [place] [address]    Culture  80,000 COLONIES/mL ESCHERICHIA COLI (A)  Final   Report Status 11/01/2017 FINAL  Final   Organism ID, Bacteria  ESCHERICHIA COLI (A)  Final      Susceptibility   Escherichia coli - MIC*    AMPICILLIN >=32 RESISTANT Resistant     CEFAZOLIN 16 SENSITIVE Sensitive     CEFTRIAXONE <=1 SENSITIVE Sensitive     CIPROFLOXACIN <=0.25 SENSITIVE Sensitive     GENTAMICIN <=1 SENSITIVE Sensitive     IMIPENEM <=0.25 SENSITIVE Sensitive     NITROFURANTOIN <=16 SENSITIVE Sensitive     TRIMETH/SULFA <=20 SENSITIVE Sensitive     AMPICILLIN/SULBACTAM >=32 RESISTANT Resistant     PIP/TAZO <=4 SENSITIVE Sensitive     Extended ESBL NEGATIVE Sensitive     * 80,000 COLONIES/mL ESCHERICHIA COLI  Culture, blood (routine x 2)     Status: None (Preliminary result)   Collection Time: 11/01/17  4:15 AM  Result Value Ref Range Status   Specimen Description BLOOD LEFT ANTECUBITAL  Final   Special Requests   Final    BOTTLES DRAWN AEROBIC AND ANAEROBIC Blood Culture adequate volume   Culture   Final    NO GROWTH 2 DAYS Performed at [facility], [address], [place], [place] [address]    Report Status PENDING  Incomplete  Culture, blood (routine x 2)     Status: None (Preliminary result)   Collection Time: 11/01/17  4:23 AM  Result Value Ref Range Status   Specimen Description BLOOD LEFT ANTECUBITAL  Final   Special Requests   Final    BOTTLES DRAWN AEROBIC AND ANAEROBIC Blood Culture adequate volume   Culture   Final    NO GROWTH 2 DAYS Performed at [facility], [place], [place], [place] [address]    Report Status PENDING  Incomplete     Time coordinating discharge:  I have spent 35 minutes face to face with the patient and on the ward discussing the patients care, assessment, plan and disposition with other care givers. >50% of the time was devoted counseling the patient about the risks and benefits of treatment/Discharge disposition and coordinating care.   SIGNED:   Damita Lack, MD  [facility] 11/03/2017, 11:43 AM Pager   If 7PM-7AM, please contact  night-coverage [email or web address] Password [email or web address]

## 2017-11-03 NOTE — Progress Notes (Signed)
Christopher Baker to be D/C'd Home per MD order.  Discussed prescriptions and follow up appointments with the patient. Prescriptions given to patient, medication list explained in detail. Pt verbalized understanding.  Allergies as of 11/03/2017      Reactions   Ciprofloxacin Nausea Only, Other (See Comments)   Stomach ache   Hydrocodone-acetaminophen Other (See Comments)   Hallucinations in higher doses   Other Other (See Comments)   Rash from neoprene wrap after knee surgery   Shellfish Allergy Nausea And Vomiting, Other (See Comments)   Reaction to scallops   Tramadol Other (See Comments)   Pt becomes combative per wife      Medication List    TAKE these medications   acetaminophen 500 MG tablet Commonly known as:  TYLENOL Take 1,000 mg by mouth 2 (two) times daily.   amoxicillin 500 MG capsule Commonly known as:  AMOXIL Take 2,000 mg by mouth See admin instructions. Take 2000 mg by mouth one hour prior to dental apointments   atorvastatin 20 MG tablet Commonly known as:  LIPITOR Take 20 mg by mouth daily.   cefdinir 300 MG capsule Commonly known as:  OMNICEF Take 1 capsule (300 mg total) by mouth 2 (two) times daily for 7 days.   cyanocobalamin 1000 MCG/ML injection Commonly known as:  (VITAMIN B-12) Inject 1,000 mcg into the muscle every 30 (thirty) days.   dofetilide 500 MCG capsule Commonly known as:  TIKOSYN Take 1 capsule (500 mcg total) by mouth 2 (two) times daily.   donepezil 10 MG tablet Commonly known as:  ARICEPT TAKE ONE TABLET BY MOUTH EVERY NIGHT AT BEDTIME   DULoxetine 30 MG capsule Commonly known as:  CYMBALTA Take 1 capsule (30 mg total) by mouth daily. What changed:  when to take this   ELIQUIS 5 MG Tabs tablet Generic drug:  apixaban TAKE 1 TABLET (5 MG TOTAL) BY MOUTH 2 (TWO) TIMES DAILY.   ezetimibe 10 MG tablet Commonly known as:  ZETIA Take 1 tablet (10 mg total) by mouth daily.   ferrous sulfate 325 (65 FE) MG tablet Take 325 mg by  mouth 2 (two) times daily with a meal.   folic acid 1 MG tablet Commonly known as:  FOLVITE Take 1 mg by mouth at bedtime.   furosemide 40 MG tablet Commonly known as:  LASIX Take 80 mg by mouth daily.   lisinopril 5 MG tablet Commonly known as:  PRINIVIL,ZESTRIL TAKE ONE TABLET BY MOUTH DAILY What changed:    how much to take  how to take this  when to take this   loperamide 2 MG tablet Commonly known as:  IMODIUM A-D Take 2 mg by mouth 3 (three) times daily as needed for diarrhea or loose stools.   LUBRICANT EYE OP Place 2 drops into both eyes daily as needed (for dry eyes).   methotrexate 2.5 MG tablet Commonly known as:  RHEUMATREX Take 15 mg by mouth every Monday.   metoprolol succinate 25 MG 24 hr tablet Commonly known as:  TOPROL XL Take 1 tablet (25 mg total) by mouth daily. What changed:  when to take this   oxybutynin 5 MG tablet Commonly known as:  DITROPAN Take 5 mg by mouth 2 (two) times daily.   oxyCODONE-acetaminophen 10-325 MG tablet Commonly known as:  PERCOCET Take 0.25-0.5 tablets by mouth 2 (two) times daily as needed for pain.   pantoprazole 40 MG tablet Commonly known as:  PROTONIX Take 1 tablet (40 mg total)  by mouth daily. What changed:  when to take this   predniSONE 5 MG tablet Commonly known as:  DELTASONE Take 5 mg by mouth daily.   Vitamin D (Ergocalciferol) 50000 units Caps capsule Commonly known as:  DRISDOL Take 50,000 Units by mouth every Sunday.            Durable Medical Equipment  (From admission, onward)        Start     Ordered   11/03/17 1121  For home use only DME 3 n 1  Once     05 /16/19 1120      Vitals:   11/02/17 2100 11/03/17 0500  BP: (!) 158/70 (!) 148/77  Pulse: 78 73  Resp: 18 18  Temp: 99 F (37.2 C)   SpO2: 97%     Skin clean, dry and intact without evidence of skin break down, no evidence of skin tears noted. IV catheter discontinued intact. Site without signs and symptoms of  complications. Dressing and pressure applied. Pt denies pain at this time. No complaints noted.  An After Visit Summary was printed and given to the patient. Patient escorted via [place], and D/C home via private auto.  Chapman Fitch BSN, RN [employer] [facility] Phone [phone]

## 2017-11-05 DIAGNOSIS — I5022 Chronic systolic (congestive) heart failure: Secondary | ICD-10-CM | POA: Diagnosis not present

## 2017-11-05 DIAGNOSIS — F0150 Vascular dementia without behavioral disturbance: Secondary | ICD-10-CM | POA: Diagnosis not present

## 2017-11-05 DIAGNOSIS — N390 Urinary tract infection, site not specified: Secondary | ICD-10-CM | POA: Diagnosis not present

## 2017-11-05 DIAGNOSIS — F418 Other specified anxiety disorders: Secondary | ICD-10-CM | POA: Diagnosis not present

## 2017-11-05 DIAGNOSIS — Z7901 Long term (current) use of anticoagulants: Secondary | ICD-10-CM | POA: Diagnosis not present

## 2017-11-05 DIAGNOSIS — B9620 Unspecified Escherichia coli [E. coli] as the cause of diseases classified elsewhere: Secondary | ICD-10-CM | POA: Diagnosis not present

## 2017-11-05 DIAGNOSIS — Z8673 Personal history of transient ischemic attack (TIA), and cerebral infarction without residual deficits: Secondary | ICD-10-CM | POA: Diagnosis not present

## 2017-11-05 DIAGNOSIS — Z8546 Personal history of malignant neoplasm of prostate: Secondary | ICD-10-CM | POA: Diagnosis not present

## 2017-11-05 DIAGNOSIS — F419 Anxiety disorder, unspecified: Secondary | ICD-10-CM | POA: Diagnosis not present

## 2017-11-05 DIAGNOSIS — I482 Chronic atrial fibrillation: Secondary | ICD-10-CM | POA: Diagnosis not present

## 2017-11-05 DIAGNOSIS — L4050 Arthropathic psoriasis, unspecified: Secondary | ICD-10-CM | POA: Diagnosis not present

## 2017-11-05 DIAGNOSIS — D6489 Other specified anemias: Secondary | ICD-10-CM | POA: Diagnosis not present

## 2017-11-05 DIAGNOSIS — Z95810 Presence of automatic (implantable) cardiac defibrillator: Secondary | ICD-10-CM | POA: Diagnosis not present

## 2017-11-05 DIAGNOSIS — Z7952 Long term (current) use of systemic steroids: Secondary | ICD-10-CM | POA: Diagnosis not present

## 2017-11-05 DIAGNOSIS — G4733 Obstructive sleep apnea (adult) (pediatric): Secondary | ICD-10-CM | POA: Diagnosis not present

## 2017-11-05 DIAGNOSIS — D649 Anemia, unspecified: Secondary | ICD-10-CM | POA: Diagnosis not present

## 2017-11-06 LAB — CULTURE, BLOOD (ROUTINE X 2)
CULTURE: NO GROWTH
Culture: NO GROWTH
SPECIAL REQUESTS: ADEQUATE
Special Requests: ADEQUATE

## 2017-11-09 ENCOUNTER — Encounter: Payer: Self-pay | Admitting: Neurology

## 2017-11-09 ENCOUNTER — Ambulatory Visit: Payer: PPO | Admitting: Neurology

## 2017-11-09 VITALS — BP 142/68 | HR 74 | Ht 75.0 in | Wt 210.0 lb

## 2017-11-09 DIAGNOSIS — F0150 Vascular dementia without behavioral disturbance: Secondary | ICD-10-CM | POA: Diagnosis not present

## 2017-11-09 DIAGNOSIS — G4733 Obstructive sleep apnea (adult) (pediatric): Secondary | ICD-10-CM

## 2017-11-09 DIAGNOSIS — D8989 Other specified disorders involving the immune mechanism, not elsewhere classified: Secondary | ICD-10-CM | POA: Diagnosis not present

## 2017-11-09 DIAGNOSIS — I509 Heart failure, unspecified: Secondary | ICD-10-CM | POA: Diagnosis not present

## 2017-11-09 DIAGNOSIS — A4151 Sepsis due to Escherichia coli [E. coli]: Secondary | ICD-10-CM | POA: Diagnosis not present

## 2017-11-09 DIAGNOSIS — N390 Urinary tract infection, site not specified: Secondary | ICD-10-CM | POA: Diagnosis not present

## 2017-11-09 DIAGNOSIS — K5900 Constipation, unspecified: Secondary | ICD-10-CM | POA: Diagnosis not present

## 2017-11-09 DIAGNOSIS — L4050 Arthropathic psoriasis, unspecified: Secondary | ICD-10-CM | POA: Diagnosis not present

## 2017-11-09 DIAGNOSIS — N3000 Acute cystitis without hematuria: Secondary | ICD-10-CM | POA: Diagnosis not present

## 2017-11-09 DIAGNOSIS — R531 Weakness: Secondary | ICD-10-CM | POA: Diagnosis not present

## 2017-11-09 DIAGNOSIS — I69959 Hemiplegia and hemiparesis following unspecified cerebrovascular disease affecting unspecified side: Secondary | ICD-10-CM | POA: Diagnosis not present

## 2017-11-09 DIAGNOSIS — I480 Paroxysmal atrial fibrillation: Secondary | ICD-10-CM | POA: Diagnosis not present

## 2017-11-09 DIAGNOSIS — D519 Vitamin B12 deficiency anemia, unspecified: Secondary | ICD-10-CM | POA: Diagnosis not present

## 2017-11-09 DIAGNOSIS — Z9989 Dependence on other enabling machines and devices: Secondary | ICD-10-CM

## 2017-11-09 DIAGNOSIS — Z6826 Body mass index (BMI) 26.0-26.9, adult: Secondary | ICD-10-CM | POA: Diagnosis not present

## 2017-11-09 DIAGNOSIS — L4059 Other psoriatic arthropathy: Secondary | ICD-10-CM | POA: Diagnosis not present

## 2017-11-09 NOTE — Patient Instructions (Addendum)
1.  Continue donepezil 10mg  at bedtime 2.  Continue use of CPAP 3.  Follow up in 6 months.

## 2017-11-09 NOTE — Progress Notes (Signed)
NEUROLOGY FOLLOW UP OFFICE NOTE  Christopher Baker [id number]  HISTORY OF PRESENT ILLNESS: Christopher Baker is an 84 year old right-handed man with hypertension, hyperlipidemia, paroxysmal atrial fibrillation, PPM implant for syncope, and history of prostate cancer status post radiation in 2007, who follows up for vascular dementia and falls.  He is accompanied by his wife who supplements history.   UPDATE: He is taking Aricept.  He is getting monthly B12 shots.  He is now using his CPAP regularly.    He found out he has significant degenerative disc disease in the lumbar spine but he was told it is too dangerous for him to have surgery.  Last visit, he was supposed to have an MRI of the cervical spine to evaluate for cervical stenosis given questionable Babinski.  It was never scheduled.   He was discharged last week from the hospital for urosepsis.  On 5/11, he became confused and had a fever of 102.7.  His wife assessed him for stroke and noted that the left side of his face wouldn't lift when he smiled.  He was brought to the hospital.  His symptoms were believed to be due to sepsis and not a TIA.  Since then he has been more irritable.  He also has been more forgetful.  For example, he has forgotten how to put on the condom catheter or how to empty the bag.  He is more frequently repeating questions.     HISTORY: I.  Cardio-embolic stroke He was admitted to the hospital on 04/03/13 with left hand and arm weakness and facial droop.  CT head revealed right basal ganglia and parietal lobe infarcts.  He was found to have atrial fibrillation.  He was started on Xarelto.  He was admitted to [facility] on 06/18/14 for confusion.  Episode lasted about 1 hour.  He was at dinner with his wife and had difficulty ordering from the menu.  His wife noted a left facial droop.  He did not have any slurred speech, focal numbness or weakness of the extremities.  His blood pressure was found to be in the  190s/80s.  CT of head revealed no abnormality.  Repeated CT of the head the following day showed no acute stroke.  Echo showed mildly decreased EF of 45-50%.  Carotid doppler showed 40% bilateral ICA stenosis.  An EEG was performed, which was negative.  Differential diagnosis included TIA verse seizure and he was started on Keppra 250mg  twice daily.  He was also restarted on lisinopril, which had been discontinued earlier in the month.  Since discharge, he exhibited lethargy and irritability.  Keppra was subsequently discontinued as a TIA was suspected rather than partial seizure.   II. Vascular cognitive impairment Over the last 3 years, he has had problems with memory.  He was repeating things and exhibited mental fogginess and lack of motivation.  He was experiencing depression and insomnia as well.  Procedural tasks and executive functioning were intact.  He was found to have a B12 level of 52 and was started on B12 shots.  He and his wife had noticed improvement in his memory.  However, insomnia and depression were still both an issue.  He had been under a lot of stress.  He was started on Celexa for depression and Proson for insomnia.  Last year, he exhibited extreme lethargy, to the point where he was sleeping during the day, which he never previously done.  Sometimes, he was in such a deep sleep,  it required repeated yelling and shaking to wake him up.  He seemed more "foggy" again.  One time, he couldn't figure out where to put the ice cream in the refrigerator.  When his wife told him to put it in the freezer, he was placing it in the vegetable drawer.  He decided to stop the Proson, and he and his wife noticed significant improvement in lethargy.  He did note some improvement in memory since starting B12 supplementation.  Memory seemed worse after the stroke, so cognitive deficits were thought to be also related to stroke.   He had a formal driving evaluation in July 2015.  He was told to drive with  someone else in the car for a couple of months so he can regain confidence.  The only restriction was that he should not drive at night unless it is a well-lit road.  He has since started driving alone sometimes, but only locally and during daylight hours.     He had formal neuropsych testing performed by Dr. Valentina Shaggy in January 2016.  Profile was consistent with outcome of a right hemispheric stroke: deficits in visual processing speed, non-dominant hand fine motor coordination/speed, and visual spatial dysfunction.  He underwent repeat neuropsychological testing on 03/15/16, which revealed mild dementia, likely multifactorial (cerebrovascular, OSA, B12 deficiency).  He demonstrated frontal-subcortical involvement.  However, he demonstrated decline in verbal memory and language since his prior evaluation, suggesting left hemispheric involvement rather than right hemispheric involvement.  This appears to be more likely due to cerebrovascular disease rather than Alzheimer's.  He underwent neuropsychological testing on 03/15/16 with Dr. Si Raider, which revealed mild dementia, likely multifactorial (cerebrovascular, OSA, B12 deficiency).  He demonstrated frontal-subcortical involvement.  However, he demonstrated decline in verbal memory and language since his prior evaluation, suggesting left hemispheric involvement rather than right hemispheric involvement.  This appears to be more likely due to cerebrovascular disease rather than Alzheimer's.  Marland Kitchen  He underwent repeat neuropsychological testing with Dr. Si Raider on 03/21/17.  Testing did still demonstrate mild dementia but with significant improvement across multiple domains when compared to testing from a year ago (including learning and memory, processing speed, attention, language and executive functioning).     III Staring Spells: He reportedly has brief staring spells, lasting just seconds.  Routine EEG was performed on 03/17/16, which showed some diffuse slowing but  no epileptiform discharges.  24 hour ambulatory EEG from 03/30/16 to 03/31/16 was performed and demonstrated mild diffuse slowing but otherwise unremarkable.  CT of head was performed on 03/31/16 was personally reviewed. It did not demonstrate a left hemispheric stroke, but it did show progressive atrophy compared to prior study from 10/31/14, and possible subacute or progression of chronic ischemia in the right parietal cortex.  Carotid doppler from 04/02/16 showed stable 40-59% left ICA stenosis but progressed right ICA stenosis at 60-79% (from 40-59% on 06/18/14).  Since starting B12, he feels better and more clear.   IV Tremor He notes mild tremor in his hands, noticeable when he holds a utensil or writes.  He first noticed this about a month or so ago.  He has some balance issues due to bilateral knee replacement surgeries.  No family history of dementia or tremor (possibly his mother had tremor).  He takes ropinirole for RLS   V Leg weakness/gait instability: He has had increased balance problems with low back pain with pain radiating down the right leg.  His legs feel weak.  Pain is worse when standing and walking.  He also notes right groin pain.  He was evaluated by his neurosurgeon and reportedly has arthritis but nothing surgical to be done.  He is followed by orthopedist and pain specialist.  He has had epidural injections as well as injection in the right hip, which has only exacerbated pain.  PAST MEDICAL HISTORY: Past Medical History:  Diagnosis Date  . Anxiety   . Arthritis    "hands, right hip; lower back" (02/28/2017)  . Arthritis with psoriasis ([place])   . Borderline diabetes    DIET CONTROLLED - PT STATES HE IS NOT DIABETIC  . Carotid stenosis, bilateral PER DR EARLY / DUPLEX  09-09-10     40 - 59%   BILATERALLY--- ASYMPTOMATIC  . Chronic lower back pain   . Depression   . Dysrhythmia    a-fib  . GERD (gastroesophageal reflux disease)    CONTROLLED W/ PROTONIX  . History of  gout   . HTN (hypertension)   . Hyperlipidemia   . Iron deficiency   . Lumbar spondylosis W/ RADICULOPATHY  . OSA on CPAP   . PAF (paroxysmal atrial fibrillation) ([place])   . Presence of permanent cardiac pacemaker DDD-  06-04-10-   DDD; SECONDARY TO SYNCOPY AND BRADYCARDIA  . Prostate cancer ([place]) 2008   S/P 78 RADIATION  [place]    . Restless leg syndrome   . Stroke [facility]) Oct. 14, 2014   light left hand, balance issues, short term memory loss 2014  . SVT (supraventricular tachycardia) ([place])    S/P ABLATION   2008    MEDICATIONS: Current Outpatient Medications on File Prior to Visit  Medication Sig Dispense Refill  . acetaminophen (TYLENOL) 500 MG tablet Take 1,000 mg by mouth 2 (two) times daily.    Marland Kitchen amoxicillin (AMOXIL) 500 MG capsule Take 2,000 mg by mouth See admin instructions. Take 2000 mg by mouth one hour prior to dental apointments    . Artificial Tear Ointment (LUBRICANT EYE OP) Place 2 drops into both eyes daily as needed (for dry eyes).    Marland Kitchen atorvastatin (LIPITOR) 20 MG tablet Take 20 mg by mouth daily.    . cefdinir (OMNICEF) 300 MG capsule Take 1 capsule (300 mg total) by mouth 2 (two) times daily for 7 days. 14 capsule 0  . cyanocobalamin (,VITAMIN B-12,) 1000 MCG/ML injection Inject 1,000 mcg into the muscle every 30 (thirty) days.     Marland Kitchen dofetilide (TIKOSYN) 500 MCG capsule Take 1 capsule (500 mcg total) by mouth 2 (two) times daily. 180 capsule 3  . donepezil (ARICEPT) 10 MG tablet TAKE ONE TABLET BY MOUTH EVERY NIGHT AT BEDTIME 90 tablet 1  . DULoxetine (CYMBALTA) 30 MG capsule Take 1 capsule (30 mg total) by mouth daily. (Patient taking differently: Take 30 mg by mouth at bedtime. ) 30 capsule 0  . ELIQUIS 5 MG TABS tablet TAKE 1 TABLET (5 MG TOTAL) BY MOUTH 2 (TWO) TIMES DAILY. 60 tablet 8  . ezetimibe (ZETIA) 10 MG tablet Take 1 tablet (10 mg total) by mouth daily. 90 tablet 1  . ferrous sulfate 325 (65 FE) MG tablet Take 325 mg by mouth 2 (two) times daily with a  meal.     . folic acid (FOLVITE) 1 MG tablet Take 1 mg by mouth at bedtime.     . furosemide (LASIX) 40 MG tablet Take 80 mg by mouth daily.     Marland Kitchen lisinopril (PRINIVIL,ZESTRIL) 5 MG tablet TAKE ONE TABLET BY MOUTH DAILY (Patient taking differently: Take  10 mg by mouth at bedtime) 90 tablet 3  . loperamide (IMODIUM A-D) 2 MG tablet Take 2 mg by mouth 3 (three) times daily as needed for diarrhea or loose stools.     . methotrexate (RHEUMATREX) 2.5 MG tablet Take 15 mg by mouth every Monday.     . metoprolol succinate (TOPROL XL) 25 MG 24 hr tablet Take 1 tablet (25 mg total) by mouth daily. (Patient taking differently: Take 25 mg by mouth at bedtime. ) 90 tablet 2  . oxybutynin (DITROPAN) 5 MG tablet Take 5 mg by mouth 2 (two) times daily.     Marland Kitchen oxyCODONE-acetaminophen (PERCOCET) 10-325 MG tablet Take 0.25-0.5 tablets by mouth 2 (two) times daily as needed for pain.     . pantoprazole (PROTONIX) 40 MG tablet Take 1 tablet (40 mg total) by mouth daily. (Patient taking differently: Take 40 mg by mouth at bedtime. ) 90 tablet 3  . predniSONE (DELTASONE) 5 MG tablet Take 5 mg by mouth daily.    . tamsulosin (FLOMAX) 0.4 MG CAPS capsule Take 0.4 mg by mouth daily.    . Vitamin D, Ergocalciferol, (DRISDOL) 50000 units CAPS capsule Take 50,000 Units by mouth every Sunday.      No current facility-administered medications on file prior to visit.     ALLERGIES: Allergies  Allergen Reactions  . Ciprofloxacin Nausea Only and Other (See Comments)    Stomach ache  . Hydrocodone-Acetaminophen Other (See Comments)    Hallucinations in higher doses  . Other Other (See Comments)    Rash from neoprene wrap after knee surgery  . Shellfish Allergy Nausea And Vomiting and Other (See Comments)    Reaction to scallops  . Tramadol Other (See Comments)    Pt becomes combative per wife    FAMILY HISTORY: Family History  Problem Relation Age of Onset  . Stroke Mother   . Early death Father     SOCIAL  HISTORY: Social History   Socioeconomic History  . Marital status: Married    Spouse name: Not on file  . Number of children: Not on file  . Years of education: Not on file  . Highest education level: Not on file  Occupational History  . Occupation: retired  Scientific laboratory technician  . Financial resource strain: Not on file  . Food insecurity:    Worry: Not on file    Inability: Not on file  . Transportation needs:    Medical: Not on file    Non-medical: Not on file  Tobacco Use  . Smoking status: Former Smoker    Packs/day: 1.00    Years: 45.00    Pack years: 45.00    Types: Cigarettes    Last attempt to quit: 12/07/1998    Years since quitting: 18.9  . Smokeless tobacco: Never Used  Substance and Sexual Activity  . Alcohol use: Yes    Comment: 1 cocktail per evening  . Drug use: No  . Sexual activity: Not Currently    Partners: Female  Lifestyle  . Physical activity:    Days per week: Not on file    Minutes per session: Not on file  . Stress: Not on file  Relationships  . Social connections:    Talks on phone: Not on file    Gets together: Not on file    Attends religious service: Not on file    Active member of club or organization: Not on file    Attends meetings of clubs or organizations: Not  on file    Relationship status: Not on file  . Intimate partner violence:    Fear of current or ex partner: Not on file    Emotionally abused: Not on file    Physically abused: Not on file    Forced sexual activity: Not on file  Other Topics Concern  . Not on file  Social History Narrative  . Not on file    REVIEW OF SYSTEMS: Constitutional: No fevers, chills, or sweats, no generalized fatigue, change in appetite Eyes: No visual changes, double vision, eye pain Ear, nose and throat: No hearing loss, ear pain, nasal congestion, sore throat Cardiovascular: No chest pain, palpitations Respiratory:  No shortness of breath at rest or with exertion, wheezes GastrointestinaI: No  nausea, vomiting, diarrhea, abdominal pain, fecal incontinence Genitourinary:  No dysuria, urinary retention or frequency Musculoskeletal:  No neck pain, back pain Integumentary: No rash, pruritus, skin lesions Neurological: as above Psychiatric: No depression, insomnia, anxiety Endocrine: No palpitations, fatigue, diaphoresis, mood swings, change in appetite, change in weight, increased thirst Hematologic/Lymphatic:  No purpura, petechiae. Allergic/Immunologic: no itchy/runny eyes, nasal congestion, recent allergic reactions, rashes  PHYSICAL EXAM: Vitals:   11/09/17 1411  BP: (!) 142/68  Pulse: 74  SpO2: 98%   General: No acute distress.  Patient appears well-groomed.  Head:  Normocephalic/atraumatic Eyes:  Fundi examined but not visualized Neck: supple, no paraspinal tenderness, full range of motion Heart:  Regular rate and rhythm Lungs:  Clear to auscultation bilaterally Back: No paraspinal tenderness Neurological Exam: alert and oriented to person, place, and time. Attention span and concentration intact, delayed recall poor, remote memory intact, fund of knowledge intact.  Speech fluent and not dysarthric, language intact.   CN II-XII intact. Bulk and tone normal, muscle strength 5/5 throughout.  Reduced finger-thumb tapping speed bilaterally.  Finger to nose testing intact with fine postural and kinetic tremor.  Sensation to light touch intact.  Deep tendon reflexes trace throughout except absent in ankles and right patellar.  Questionable bilateral Babinski.  Short strides.    IMPRESSION: Mild dementia, unspecified. Etiology unclear. Given the significant improvement in cognitive testing performance, a neurodegenerative disease such as Alzheimer's is unlikely. Practice testing effect would not account for such a dramatic improvement. I think it is still vascular related,but unusual to have such an improvement in cognitive testing.Possibly the Aricept and treatment of OSA has  contributed to improvement.  I explained that it is not unusual for there to be a cognitive decline following a stress on the body, such as infection.  He may or may not return to previous baseline.  I cannot say if he had a TIA at the time he was brought to the hospital.  However, an infection may exacerbate prior lateralizing deficits from a prior stroke.    He still has questionable Babinski.  He does not exhibit other myelopathic signs.  However, there has been no significant change on neurologic exam.  Also, he has been told by his neurosurgeon that surgery is not safe for him.  Therefore, we will defer MRI of cervical spine.  27 minutes spent face to face with patient, over 50% spent discussing management.  Metta Clines, DO  CC:  Dr. Osborne Casco

## 2017-11-10 DIAGNOSIS — G4733 Obstructive sleep apnea (adult) (pediatric): Secondary | ICD-10-CM | POA: Diagnosis not present

## 2017-11-10 DIAGNOSIS — F0280 Dementia in other diseases classified elsewhere without behavioral disturbance: Secondary | ICD-10-CM | POA: Diagnosis not present

## 2017-11-10 DIAGNOSIS — N3040 Irradiation cystitis without hematuria: Secondary | ICD-10-CM | POA: Diagnosis not present

## 2017-11-10 DIAGNOSIS — Z7901 Long term (current) use of anticoagulants: Secondary | ICD-10-CM | POA: Diagnosis not present

## 2017-11-10 DIAGNOSIS — Z8546 Personal history of malignant neoplasm of prostate: Secondary | ICD-10-CM | POA: Diagnosis not present

## 2017-11-25 ENCOUNTER — Other Ambulatory Visit: Payer: Self-pay | Admitting: Neurology

## 2017-11-27 ENCOUNTER — Other Ambulatory Visit: Payer: Self-pay | Admitting: Internal Medicine

## 2017-11-29 ENCOUNTER — Ambulatory Visit: Payer: PPO | Attending: Internal Medicine

## 2017-11-29 ENCOUNTER — Other Ambulatory Visit: Payer: Self-pay

## 2017-11-29 DIAGNOSIS — R2681 Unsteadiness on feet: Secondary | ICD-10-CM | POA: Diagnosis not present

## 2017-11-29 DIAGNOSIS — M6281 Muscle weakness (generalized): Secondary | ICD-10-CM | POA: Diagnosis not present

## 2017-11-29 DIAGNOSIS — R2689 Other abnormalities of gait and mobility: Secondary | ICD-10-CM | POA: Diagnosis not present

## 2017-11-29 NOTE — Therapy (Addendum)
[facility] [address] [place], [place], [address] Phone: [phone]   Fax:  [phone]  Physical Therapy Evaluation  Patient Details  Name: Christopher Baker MRN: [id number] Date of Birth: 10-Mar-1934 Referring Provider: Dr. Domenick Baker   Encounter Date: 11/29/2017  PT End of Session - 11/29/17 1329    Visit Number  1    Number of Visits  17    Date for PT Re-Evaluation  01/28/18    Authorization Type  [employer]    PT Start Time  1019    PT Stop Time  1101    PT Time Calculation (min)  42 min    Equipment Utilized During Treatment  -- min guard to S prn    Activity Tolerance  Patient tolerated treatment well    Behavior During Therapy  [facility] for tasks assessed/performed       Past Medical History:  Diagnosis Date  . Anxiety   . Arthritis    "hands, right hip; lower back" (02/28/2017)  . Arthritis with psoriasis ([place])   . Borderline diabetes    DIET CONTROLLED - PT STATES HE IS NOT DIABETIC  . Carotid stenosis, bilateral PER DR EARLY / DUPLEX  09-09-10     40 - 59%   BILATERALLY--- ASYMPTOMATIC  . Chronic lower back pain   . Depression   . Dysrhythmia    a-fib  . GERD (gastroesophageal reflux disease)    CONTROLLED W/ PROTONIX  . History of gout   . HTN (hypertension)   . Hyperlipidemia   . Iron deficiency   . Lumbar spondylosis W/ RADICULOPATHY  . OSA on CPAP   . PAF (paroxysmal atrial fibrillation) ([place])   . Presence of permanent cardiac pacemaker DDD-  06-04-10-   DDD; SECONDARY TO SYNCOPY AND BRADYCARDIA  . Prostate cancer ([place]) 2008   S/P 73 RADIATION  [place]    . Restless leg syndrome   . Stroke [facility]) Oct. 14, 2014   light left hand, balance issues, short term memory loss 2014  . SVT (supraventricular tachycardia) ([place])    S/P ABLATION   2008    Past Surgical History:  Procedure Laterality Date  . BACK SURGERY    . BIV UPGRADE N/A 09/07/2017   Procedure: BIV PACEMAKER  UPGRADE;  Surgeon: Christopher Lance, MD;  Location: [place] CV [facility];  Service: Cardiovascular;  Laterality: N/A;  . BREAST SURGERY     "removed tumor; don't remember which side; years ago" (02/28/2017)  . CARDIAC ELECTROPHYSIOLOGY STUDY AND ABLATION  2008   FOR SVT  . CARDIAC PACEMAKER PLACEMENT  06-04-10   DDD  . CARDIOVERSION N/A 03/02/2017   Procedure: CARDIOVERSION;  Surgeon: Christopher Hector, MD;  Location: [facility] ENDOSCOPY;  Service: Cardiovascular;  Laterality: N/A;  . CATARACT EXTRACTION W/ INTRAOCULAR LENS  IMPLANT, BILATERAL Bilateral   . CHOLECYSTECTOMY  2009  . ESOPHAGOGASTRODUODENOSCOPY N/A 12/12/2014   Procedure: ESOPHAGOGASTRODUODENOSCOPY (EGD);  Surgeon: Christopher Corner, MD;  Location: [facility] ENDOSCOPY;  Service: Endoscopy;  Laterality: N/A;  . INCISION AND DRAINAGE Right 1997   "knee replacement got infected"  . INGUINAL HERNIA REPAIR Left 06/2003    DONE WITH PENILE PROSTESIS SURG.  . INGUINAL HERNIA REPAIR Right 1962  . JOINT REPLACEMENT    . KNEE ARTHROSCOPY  06/02/2011   Procedure: ARTHROSCOPY KNEE;  Surgeon: Christopher Baker;  Location: [place];  Service: Orthopedics;  Laterality: Left;  LEFT KNEE ARTHROSCOPY WITH DEBRIDEMENT  . LOOP  RECORDER PLACEMENT  01-30-10  . LOOP RECORDER REMOVAL  06/04/2010  . LUMBAR LAMINECTOMY/ DISKECTOMY/ FUSION  05-19-10   L4 - 5  . PENILE PROSTHESIS IMPLANT  06/2003   AND LEFT CORPOROPLASTY /    DONE INGUINAL REPAIR  . PROSTATE BIOPSY  2007  . REVISION TOTAL KNEE ARTHROPLASTY Right 1997  . TOTAL KNEE ARTHROPLASTY  12/20/2011   Procedure: TOTAL KNEE ARTHROPLASTY;  Surgeon: Christopher Alf, MD;  Location: [facility];  Service: Orthopedics;  Laterality: Left;  . TOTAL KNEE ARTHROPLASTY Right 1997  . TOTAL SHOULDER ARTHROPLASTY Right 06/27/2015   Procedure: REVERSE TOTAL SHOULDER ARTHROPLASTY;  Surgeon: Christopher Cedars, MD;  Location: [facility];  Service: Orthopedics;  Laterality: Right;  . TRANSURETHRAL RESECTION OF BLADDER TUMOR WITH GYRUS  (TURBT-GYRUS)  ?2018  . TRANSURETHRAL RESECTION OF PROSTATE  2006    There were no vitals filed for this visit.   Subjective Assessment - 11/29/17 1030    Subjective  [name], Christopher Baker, assisted with hx based on pt's cognitive deficits. Pt requires rollator to assist with walking 2/2 weakness s/p 2014 CVA and LBP. Pt is getting acupuncture for LBP and it's helping a bit. Pt recently had HHPT (for 3 weeks, was d/c last week) and has experienced a gradual decline since OPPT neuro for CVA (was walking without AD). Pt has radiation cystitis and has been ill for the last 2.5 months. He has been hospitalized three times, once for pacemaker and twice 2/2 radiation cystitis. Pt has not fallen in the last six months.     Patient is accompained by:  Family member wife: Christopher Baker    Pertinent History  HTN, a-fib, CVA in 03/2013, OSA, RLS, vascular dementia, B THA, R shoulder replacement, carotid stenosis, Lx spondylosis, SVT, pacemaker, CHF (systolic), hx of gout, partial seizures    Patient Stated Goals  I want to be on the golf course by September, using a cane vs. rollator.     Currently in Pain?  Yes    Pain Score  -- 2-3/10    Pain Location  Back    Pain Orientation  Lower    Pain Descriptors / Indicators  Aching;Burning;Sharp    Pain Type  Chronic pain    Pain Onset  More than a month ago    Pain Frequency  Constant    Aggravating Factors   standing up and starting to walk    Pain Relieving Factors  Acupuncture, rest, medication         [facility] PT Assessment - 11/29/17 1040      Assessment   Medical Diagnosis  Weakness    Referring Provider  Dr. Domenick Baker    Onset Date/Surgical Date  11/30/15 progressively gotten worse    Hand Dominance  Right    Prior Therapy  HHPT last week and OPPT neuro in 2014      Precautions   Precautions  Fall;ICD/Pacemaker      Restrictions   Weight Bearing Restrictions  No      Balance Screen   Has the patient fallen in the past 6 months  No    Has the  patient had a decrease in activity level because of a fear of falling?   No    Is the patient reluctant to leave their home because of a fear of falling?   No      Home Film/video editor residence    Living Arrangements  Spouse/significant other    Available  Help at Discharge  Family    Type of Micro to enter    Entrance Stairs-Number of Steps  3    Entrance Stairs-Rails  Can reach both    [place]  Two level;Able to live on main level with bedroom/bathroom    Alternate Level Stairs-Number of Steps  10    Alternate Level Stairs-Rails  Right    Home Equipment  Walker - 4 wheels;Cane - quad;Bedside commode;Shower seat - built in;Grab bars - tub/shower;Grab bars - toilet      Prior Function   Level of Independence  Independent with basic ADLs;Independent with household mobility with device;Independent with community mobility with device    Vocation  Retired    Office manager, play games      Cognition   Overall Cognitive Status  History of cognitive impairments - at baseline      Sensation   Light Touch  Appears Intact    Additional Comments  Pt denied N/T.      Coordination   Gross Motor Movements are Fluid and Coordinated  No    Fine Motor Movements are Fluid and Coordinated  No    Heel Public librarian. speed and range      Posture/Postural Control   Posture/Postural Control  Postural limitations    Postural Limitations  Forward head;Rounded Shoulders;Decreased lumbar lordosis      Tone   Assessment Location  -- denied spasms but has RLS      ROM / Strength   AROM / PROM / Strength  AROM;Strength      AROM   Overall AROM   Deficits    Overall AROM Comments  BUE WFL, Lacking approx. 5 degrees of B knee ext (hx of B TKA)      Strength   Overall Strength  Deficits    Overall Strength Comments  BUE WFL, BLE: 4/5 grossly except for 2/5 B ankle dorsiflexion and 3+/5 B hip abd/add and suspected B hip weakness 2/2 gait  deviations.      Transfers   Transfers  Sit to Stand;Stand to Sit    Sit to Stand  5: Supervision;With upper extremity assist;From chair/3-in-1    Stand to Sit  5: Supervision;With upper extremity assist;To chair/3-in-1      Ambulation/Gait   Ambulation/Gait  Yes    Ambulation/Gait Assistance  4: Min guard;5: Supervision    Ambulation/Gait Assistance Details  Min guard during turns. Cues for upright posture, heel strike, and to stay close to rollator.     Ambulation Distance (Feet)  100 Feet    Assistive device  Rollator    Gait Pattern  Step-through pattern;Decreased stride length;Decreased dorsiflexion - left;Trunk flexed    Ambulation Surface  Level;Indoor    Gait velocity  1.17ft/sec.       Standardized Balance Assessment   Standardized Balance Assessment  Timed Up and Go Test      Timed Up and Go Test   TUG  Normal TUG    Normal TUG (seconds)  25.36 with rollator                Objective measurements completed on examination: See above findings.              PT Education - 11/29/17 1328    Education Details  PT exam findings, PT POC, frequency and duration.     Person(s) Educated  Patient;Spouse    Methods  Explanation  Comprehension  Verbalized understanding       PT Short Term Goals - 11/29/17 1335      PT SHORT TERM GOAL #1   Title  pt will be IND with HEP in order to improve strength, balance, and posture. TARGET DATE FOR ALL LTGS: 12/27/17    Status  New      PT SHORT TERM GOAL #2   Title  Pt will perform TUG with LRAD to </=20 sec. to decr. falls risk.     Status  New      PT SHORT TERM GOAL #3   Title  Pt will improve gait speed with LRAD to >/=2.23ft/sec. to improve functional mobililty.     Status  New      PT SHORT TERM GOAL #4   Title  Perform BERG and write STG and LTG.    Status  New      PT SHORT TERM GOAL #5   Title  Pt will amb. 300' over even terrain with LRAD at MOD I level to improve functional mobility.     Status   New        PT Long Term Goals - 11/29/17 1337      PT LONG TERM GOAL #1   Title  Pt will amb. 600' over even/uneven surfaces with LRAD at MOD I level to improve functional mobility. TARGET DATE FOR ALL LTGS: 01/24/18    Status  New      PT LONG TERM GOAL #2   Title  Pt will improve gait speed with LRAD to >/=2.74ft/sec. to safely amb. in the community.     Status  New      PT LONG TERM GOAL #3   Title  Trial cane and set goal for household mobility if appropriate.     Status  New      PT LONG TERM GOAL #4   Title  Pt will perform TUG with LRAD in </=13.5 sec. to decr. falls risk.     Status  New             Plan - 11/29/17 1330    Clinical Impression Statement  Pt is a pleasant 84y/o male presenting to OPPT neuro for weakness. Pt's PMH is significant for the following: HTN, a-fib, CVA in 03/2013, OSA, RLS, vascular dementia, B THA, R shoulder replacement, carotid stenosis, Lx spondylosis, SVT, pacemaker, CHF (systolic), hx of gout, partial seizures. Pt's TUG time indicates pt is at a high risk for falls. Pt's gait speed with rollator is just above falls risk cut-off, however, pt required UE support to maintain balance. PT will perform BERG test next session to formally assess balance, unable to perform 2/2 time constraints. The following deficits were noted upon exam: gait deviations, impaired balance, decr. strength, decr. B knee ext (hx of TKA), postural dysfunction, impaired flexibility, and hx of cognitive impairments. Pt would benefit from skilled PT to improve safety during functional mobility.     History and Personal Factors relevant to plan of care:  Pt likes to be active outdoors, golfing and yardwork. Pt lives with wife and has cognitive deficits    Clinical Presentation  Evolving    Clinical Presentation due to:  HTN, a-fib, CVA in 03/2013, OSA, RLS, vascular dementia, B THA, R shoulder replacement, carotid stenosis, Lx spondylosis, SVT, pacemaker, CHF (systolic), hx of  gout, partial seizures    Clinical Decision Making  Moderate    Rehab Potential  Good    Clinical  Impairments Affecting Rehab Potential  see above.    PT Frequency  2x / week    PT Duration  8 weeks    PT Treatment/Interventions  ADLs/Self Care Home Management;Biofeedback;Canalith Repostioning;Therapeutic exercise;Therapeutic activities;Manual techniques;Vestibular;Orthotic Fit/Training;Functional mobility training;Stair training;Gait training;Patient/family education;DME Instruction;Cognitive remediation;Neuromuscular re-education;Balance training    PT Next Visit Plan  Perform BERG and write goal, initiate OTAGO HEP.     Consulted and Agree with Plan of Care  Patient       Patient will benefit from skilled therapeutic intervention in order to improve the following deficits and impairments:  Abnormal gait, Decreased endurance, Decreased knowledge of use of DME, Decreased strength, Decreased balance, Decreased mobility, Decreased cognition, Decreased range of motion, Impaired flexibility, Postural dysfunction, Decreased coordination(PT will not directly treat back pain but will monitor closely)  Visit Diagnosis: Other abnormalities of gait and mobility - Plan: PT plan of care cert/re-cert  Unsteadiness on feet - Plan: PT plan of care cert/re-cert  Muscle weakness (generalized) - Plan: PT plan of care cert/re-cert     Problem List Patient Active Problem List   Diagnosis Date Noted  . Sepsis, unspecified organism ([place]) 10/30/2017  . Acute urinary retention 10/30/2017  . Chronic systolic heart failure ([place]) 09/07/2017  . S/P shoulder replacement 06/27/2015  . Residual cognitive deficit as late effect of stroke 04/15/2015  . Lung nodule   . Weakness generalized 12/11/2014  . Leukocytosis 12/11/2014  . Restless leg syndrome 12/11/2014  . Normocytic anemia 12/11/2014  . Abnormal EKG 12/11/2014  . Gait disturbance, post-stroke 10/22/2014  . Partial seizures ([place]) 06/18/2014  .  Vitamin B12 deficiency 06/18/2014  . OSA (obstructive sleep apnea) 02/26/2014  . Cognitive deficits as late effect of cerebrovascular disease 01/11/2014  . Fatigue 09/20/2013  . Intermittent lightheadedness 06/19/2013  . Low blood pressure, not hypotension 06/19/2013  . Vascular dementia 04/09/2013  . History of completed stroke 04/03/2013  . Occlusion and stenosis of carotid artery without mention of cerebral infarction 11/21/2012  . OA (osteoarthritis) of knee 12/20/2011  . Methotrexate, long term, current use 09/10/2011  . Knee pain, left 07/27/2011  . Pacemaker-Medtronic 09/08/2010  . UNSPECIFIED VISUAL DISTURBANCE 07/20/2010  . IRON DEFIC ANEMIA [place] DIET IRON INTAKE 04/20/2010  . PSORIASIS 04/20/2010  . DISUSE OSTEOPOROSIS 05/12/2009  . SPONDYLOSIS, LUMBAR, WITH RADICULOPATHY 10/15/2008  . OTHER SPECIFIED DERMATOMYCOSES 06/24/2008  . VARICOSE VEINS LOWER EXTREMITIES W/INFLAMMATION 12/25/2007  . INSOMNIA WITH SLEEP APNEA UNSPECIFIED 08/31/2007  . GERD 03/23/2007  . Atrial fibrillation, chronic ([place]) 01/31/2007  . PSORIATIC ARTHRITIS 01/31/2007  . Hyperlipidemia 01/19/2007  . Essential hypertension 01/19/2007  . PROSTATE CANCER, HX OF 01/19/2007    Jayden Rudge L 11/29/2017, 1:52 PM  [place] [address] [place], [place], [address] Phone: [phone]   Fax:  [phone]  Name: Christopher Baker MRN: [id number] Date of Birth: Oct 14, 1933  Geoffry Paradise, PT,DPT 11/29/17 1:52 PM Phone: [phone] Fax: [phone]

## 2017-11-30 ENCOUNTER — Ambulatory Visit (INDEPENDENT_AMBULATORY_CARE_PROVIDER_SITE_OTHER): Payer: PPO | Admitting: Psychology

## 2017-11-30 DIAGNOSIS — F320 Major depressive disorder, single episode, mild: Secondary | ICD-10-CM

## 2017-12-07 ENCOUNTER — Telehealth: Payer: Self-pay

## 2017-12-07 NOTE — Telephone Encounter (Signed)
Rcvd fax from [employer] [employer] requesting refill on duloxetine 30mg . Called pharmacy, spoke with Lelon Frohlich, advised her Dr Thompson Grayer is prescribes this. She will correct in her system.

## 2017-12-09 ENCOUNTER — Encounter: Payer: Self-pay | Admitting: Internal Medicine

## 2017-12-09 ENCOUNTER — Ambulatory Visit: Payer: PPO | Admitting: Rehabilitation

## 2017-12-09 ENCOUNTER — Ambulatory Visit (INDEPENDENT_AMBULATORY_CARE_PROVIDER_SITE_OTHER): Payer: PPO | Admitting: Internal Medicine

## 2017-12-09 VITALS — BP 124/58 | HR 72 | Ht 75.0 in | Wt 218.0 lb

## 2017-12-09 DIAGNOSIS — I495 Sick sinus syndrome: Secondary | ICD-10-CM | POA: Diagnosis not present

## 2017-12-09 DIAGNOSIS — I5022 Chronic systolic (congestive) heart failure: Secondary | ICD-10-CM

## 2017-12-09 DIAGNOSIS — Z79899 Other long term (current) drug therapy: Secondary | ICD-10-CM | POA: Diagnosis not present

## 2017-12-09 DIAGNOSIS — I480 Paroxysmal atrial fibrillation: Secondary | ICD-10-CM | POA: Diagnosis not present

## 2017-12-09 DIAGNOSIS — Z950 Presence of cardiac pacemaker: Secondary | ICD-10-CM | POA: Diagnosis not present

## 2017-12-09 LAB — CUP PACEART INCLINIC DEVICE CHECK
Brady Statistic AP VP Percent: 18.87 %
Brady Statistic AP VS Percent: 0.1 %
Brady Statistic AS VP Percent: 71.33 %
Brady Statistic AS VS Percent: 9.69 %
Brady Statistic RV Percent Paced: 90.04 %
Implantable Lead Implant Date: 20190320
Implantable Lead Model: 4076
Implantable Pulse Generator Implant Date: 20190320
Lead Channel Impedance Value: 285 Ohm
Lead Channel Impedance Value: 304 Ohm
Lead Channel Impedance Value: 380 Ohm
Lead Channel Impedance Value: 627 Ohm
Lead Channel Impedance Value: 741 Ohm
Lead Channel Impedance Value: 798 Ohm
Lead Channel Impedance Value: 931 Ohm
Lead Channel Pacing Threshold Amplitude: 1.25 V
Lead Channel Pacing Threshold Pulse Width: 0.4 ms
Lead Channel Sensing Intrinsic Amplitude: 6.5 mV
Lead Channel Setting Pacing Amplitude: 2.25 V
Lead Channel Setting Pacing Amplitude: 2.5 V
Lead Channel Setting Pacing Pulse Width: 0.4 ms
Lead Channel Setting Pacing Pulse Width: 0.4 ms
MDC IDC LEAD IMPLANT DT: 20111215
MDC IDC LEAD IMPLANT DT: 20111215
MDC IDC LEAD LOCATION: 753858
MDC IDC LEAD LOCATION: 753859
MDC IDC LEAD LOCATION: 753860
MDC IDC MSMT BATTERY REMAINING LONGEVITY: 106 mo
MDC IDC MSMT BATTERY VOLTAGE: 2.99 V
MDC IDC MSMT LEADCHNL LV IMPEDANCE VALUE: 608 Ohm
MDC IDC MSMT LEADCHNL LV IMPEDANCE VALUE: 798 Ohm
MDC IDC MSMT LEADCHNL LV PACING THRESHOLD PULSEWIDTH: 0.4 ms
MDC IDC MSMT LEADCHNL RA IMPEDANCE VALUE: 380 Ohm
MDC IDC MSMT LEADCHNL RA PACING THRESHOLD AMPLITUDE: 0.75 V
MDC IDC MSMT LEADCHNL RA SENSING INTR AMPL: 1.375 mV
MDC IDC MSMT LEADCHNL RA SENSING INTR AMPL: 2.25 mV
MDC IDC MSMT LEADCHNL RV PACING THRESHOLD AMPLITUDE: 1.5 V
MDC IDC MSMT LEADCHNL RV PACING THRESHOLD PULSEWIDTH: 0.4 ms
MDC IDC MSMT LEADCHNL RV SENSING INTR AMPL: 6.125 mV
MDC IDC SESS DTM: 20190621145533
MDC IDC SET LEADCHNL RA PACING AMPLITUDE: 2 V
MDC IDC SET LEADCHNL RV SENSING SENSITIVITY: 2.8 mV
MDC IDC STAT BRADY RA PERCENT PACED: 21.33 %

## 2017-12-09 NOTE — Progress Notes (Signed)
HPI Mr. Steck returns today for followup after undergoing upgrade of his BiV PPM. He is a pleasant 84 yo man with CHB, PAF, remote stroke, bladder CA, with radiation induced difficulty with urination and incontinence. He was in the hospital after his biv upgrade with a urinary infection.  Allergies  Allergen Reactions  . Ciprofloxacin Nausea Only and Other (See Comments)    Stomach ache  . Hydrocodone-Acetaminophen Other (See Comments)    Hallucinations in higher doses  . Other Other (See Comments)    Rash from neoprene wrap after knee surgery  . Shellfish Allergy Nausea And Vomiting and Other (See Comments)    Reaction to scallops  . Tramadol Other (See Comments)    Pt becomes combative per wife     Current Outpatient Medications  Medication Sig Dispense Refill  . acetaminophen (TYLENOL) 500 MG tablet Take 1,000 mg by mouth 2 (two) times daily.    Marland Kitchen amoxicillin (AMOXIL) 500 MG capsule Take 2,000 mg by mouth See admin instructions. Take 2000 mg by mouth one hour prior to dental apointments    . Artificial Tear Ointment (LUBRICANT EYE OP) Place 2 drops into both eyes daily as needed (for dry eyes).    Marland Kitchen atorvastatin (LIPITOR) 20 MG tablet Take 20 mg by mouth daily.    . cyanocobalamin (,VITAMIN B-12,) 1000 MCG/ML injection Inject 1,000 mcg into the muscle every 30 (thirty) days.     Marland Kitchen dofetilide (TIKOSYN) 500 MCG capsule Take 1 capsule (500 mcg total) by mouth 2 (two) times daily. 180 capsule 3  . donepezil (ARICEPT) 10 MG tablet TAKE ONE TABLET BY MOUTH EVERY NIGHT AT BEDTIME 90 tablet 1  . DULoxetine (CYMBALTA) 30 MG capsule Take 1 capsule (30 mg total) by mouth daily. (Patient taking differently: Take 30 mg by mouth at bedtime. ) 30 capsule 0  . ELIQUIS 5 MG TABS tablet TAKE 1 TABLET (5 MG TOTAL) BY MOUTH 2 (TWO) TIMES DAILY. 60 tablet 8  . ezetimibe (ZETIA) 10 MG tablet Take 1 tablet (10 mg total) by mouth daily. 90 tablet 1  . ferrous sulfate 325 (65 FE) MG tablet Take  325 mg by mouth 2 (two) times daily with a meal.     . folic acid (FOLVITE) 1 MG tablet Take 1 mg by mouth at bedtime.     . furosemide (LASIX) 40 MG tablet Take 80 mg by mouth daily.     Marland Kitchen lisinopril (PRINIVIL,ZESTRIL) 5 MG tablet TAKE ONE TABLET BY MOUTH DAILY 90 tablet 2  . loperamide (IMODIUM A-D) 2 MG tablet Take 2 mg by mouth 3 (three) times daily as needed for diarrhea or loose stools.     . methotrexate (RHEUMATREX) 2.5 MG tablet Take 15 mg by mouth every Monday.     . metoprolol succinate (TOPROL XL) 25 MG 24 hr tablet Take 1 tablet (25 mg total) by mouth daily. (Patient taking differently: Take 25 mg by mouth at bedtime. ) 90 tablet 2  . oxyCODONE-acetaminophen (PERCOCET) 10-325 MG tablet Take 0.25-0.5 tablets by mouth 2 (two) times daily as needed for pain.     . pantoprazole (PROTONIX) 40 MG tablet Take 1 tablet (40 mg total) by mouth daily. (Patient taking differently: Take 40 mg by mouth at bedtime. ) 90 tablet 3  . predniSONE (DELTASONE) 5 MG tablet Take 5 mg by mouth daily.    . tamsulosin (FLOMAX) 0.4 MG CAPS capsule Take 0.4 mg by mouth daily.    . Vitamin  D, Ergocalciferol, (DRISDOL) 50000 units CAPS capsule Take 50,000 Units by mouth every Sunday.      No current facility-administered medications for this visit.      Past Medical History:  Diagnosis Date  . Anxiety   . Arthritis    "hands, right hip; lower back" (02/28/2017)  . Arthritis with psoriasis (HCC)   . Borderline diabetes    DIET CONTROLLED - PT STATES HE IS NOT DIABETIC  . Carotid stenosis, bilateral PER DR EARLY / DUPLEX  09-09-10     40  - 59%   BILATERALLY--- ASYMPTOMATIC  . Chronic lower back pain   . Depression   . Dysrhythmia    a-fib  . GERD (gastroesophageal reflux disease)    CONTROLLED W/ PROTONIX  . History of gout   . HTN (hypertension)   . Hyperlipidemia   . Iron deficiency   . Lumbar spondylosis W/ RADICULOPATHY  . OSA on CPAP   . PAF (paroxysmal atrial fibrillation) ([place])   .  Presence of permanent cardiac pacemaker DDD-  06-04-10-   DDD; SECONDARY TO SYNCOPY AND BRADYCARDIA  . Prostate cancer ([place]) 2008   S/P 61 RADIATION  [place]    . Restless leg syndrome   . Stroke [facility]) Oct. 14, 2014   light left hand, balance issues, short term memory loss 2014  . SVT (supraventricular tachycardia) ([place])    S/P ABLATION   2008    ROS:   All systems reviewed and negative except as noted in the HPI.   Past Surgical History:  Procedure Laterality Date  . BACK SURGERY    . BIV UPGRADE N/A 09/07/2017   Procedure: BIV PACEMAKER UPGRADE;  Surgeon: Evans Lance, MD;  Location: [facility];  Service: Cardiovascular;  Laterality: N/A;  . BREAST SURGERY     "removed tumor; don't remember which side; years ago" (02/28/2017)  . CARDIAC ELECTROPHYSIOLOGY STUDY AND ABLATION  2008   FOR SVT  . CARDIAC PACEMAKER PLACEMENT  06-04-10   DDD  . CARDIOVERSION N/A 03/02/2017   Procedure: CARDIOVERSION;  Surgeon: Josue Hector, MD;  Location: [facility] ENDOSCOPY;  Service: Cardiovascular;  Laterality: N/A;  . CATARACT EXTRACTION W/ INTRAOCULAR LENS  IMPLANT, BILATERAL Bilateral   . CHOLECYSTECTOMY  2009  . ESOPHAGOGASTRODUODENOSCOPY N/A 12/12/2014   Procedure: ESOPHAGOGASTRODUODENOSCOPY (EGD);  Surgeon: Wilford Corner, MD;  Location: [facility] ENDOSCOPY;  Service: Endoscopy;  Laterality: N/A;  . INCISION AND DRAINAGE Right 1997   "knee replacement got infected"  . INGUINAL HERNIA REPAIR Left 06/2003    DONE WITH PENILE PROSTESIS SURG.  . INGUINAL HERNIA REPAIR Right 1962  . JOINT REPLACEMENT    . KNEE ARTHROSCOPY  06/02/2011   Procedure: ARTHROSCOPY KNEE;  Surgeon: Gearlean Alf;  Location: [facility];  Service: Orthopedics;  Laterality: Left;  LEFT KNEE ARTHROSCOPY WITH DEBRIDEMENT  . LOOP RECORDER PLACEMENT  01-30-10  . LOOP RECORDER REMOVAL  06/04/2010  . LUMBAR LAMINECTOMY/ DISKECTOMY/ FUSION  05-19-10   L4 - 5  . PENILE PROSTHESIS IMPLANT  06/2003   AND LEFT  CORPOROPLASTY /    DONE INGUINAL REPAIR  . PROSTATE BIOPSY  2007  . REVISION TOTAL KNEE ARTHROPLASTY Right 1997  . TOTAL KNEE ARTHROPLASTY  12/20/2011   Procedure: TOTAL KNEE ARTHROPLASTY;  Surgeon: Gearlean Alf, MD;  Location: [facility];  Service: Orthopedics;  Laterality: Left;  . TOTAL KNEE ARTHROPLASTY Right 1997  . TOTAL SHOULDER ARTHROPLASTY Right 06/27/2015   Procedure: REVERSE TOTAL SHOULDER ARTHROPLASTY;  Surgeon: Richardson Landry  Veverly Fells, MD;  Location: [facility];  Service: Orthopedics;  Laterality: Right;  . TRANSURETHRAL RESECTION OF BLADDER TUMOR WITH GYRUS (TURBT-GYRUS)  ?2018  . TRANSURETHRAL RESECTION OF PROSTATE  2006     Family History  Problem Relation Age of Onset  . Stroke Mother   . Early death Father      Social History   Socioeconomic History  . Marital status: Married    Spouse name: Not on file  . Number of children: Not on file  . Years of education: Not on file  . Highest education level: Not on file  Occupational History  . Occupation: retired  Scientific laboratory technician  . Financial resource strain: Not on file  . Food insecurity:    Worry: Not on file    Inability: Not on file  . Transportation needs:    Medical: Not on file    Non-medical: Not on file  Tobacco Use  . Smoking status: Former Smoker    Packs/day: 1.00    Years: 45.00    Pack years: 45.00    Types: Cigarettes    Last attempt to quit: 12/07/1998    Years since quitting: 19.0  . Smokeless tobacco: Never Used  Substance and Sexual Activity  . Alcohol use: Yes    Comment: 1 cocktail per evening  . Drug use: No  . Sexual activity: Not Currently    Partners: Female  Lifestyle  . Physical activity:    Days per week: Not on file    Minutes per session: Not on file  . Stress: Not on file  Relationships  . Social connections:    Talks on phone: Not on file    Gets together: Not on file    Attends religious service: Not on file    Active member of club or organization: Not on file    Attends meetings of  clubs or organizations: Not on file    Relationship status: Not on file  . Intimate partner violence:    Fear of current or ex partner: Not on file    Emotionally abused: Not on file    Physically abused: Not on file    Forced sexual activity: Not on file  Other Topics Concern  . Not on file  Social History Narrative  . Not on file     BP (!) 124/58   Pulse 72   Ht 6\' 3"  (1.905 m)   Wt 218 lb (98.9 kg)   SpO2 96%   BMI 27.25 kg/m   Physical Exam:  stable appearing 84 yo man, NAD HEENT: Unremarkable Neck:  6 cm JVD, no thyromegally Lymphatics:  No adenopathy Back:  No CVA tenderness Lungs:  Clear with rare basilar rales. HEART:  Regular rate rhythm, no murmurs, no rubs, no clicks Abd:  soft, positive bowel sounds, no organomegally, no rebound, no guarding Ext:  2 plus pulses, no edema, no cyanosis, no clubbing Skin:  No rashes no nodules Neuro:  CN II through XII intact, motor grossly intact  EKG - none  DEVICE  Normal device function.  See PaceArt for details.   Assess/Plan: 1. Chronic systolic heart failure - he is a bit volume overloaded but admits to non-compliance with his lasix. I have strongly encouraged him to take his lasix and avoid salty food. 2. PPM - interrogation of his biv ppm demonstrates normal device function.  3. Atrial fib - his ventricular rate is controlled. He has been in rhythm for most of the past 3 months.  4. HTN - his blood pressure is under good control. He will continue his current meds. I have asked him to reduce his salt intake.  Mikle Bosworth.D

## 2017-12-09 NOTE — Patient Instructions (Addendum)
Medication Instructions:  Your physician recommends that you continue on your current medications as directed. Please refer to the Current Medication list given to you today.  Take your LASIX.  Labwork: None ordered.  Testing/Procedures: None ordered.  Follow-Up: Your physician wants you to follow-up in: 9 months with Dr. Lovena Le.   You will receive a reminder letter in the mail two months in advance. If you don't receive a letter, please call our office to schedule the follow-up appointment.  Remote monitoring is used to monitor your Pacemaker from home. This monitoring reduces the number of office visits required to check your device to one time per year. It allows [place] to keep an eye on the functioning of your device to ensure it is working properly. You are scheduled for a device check from home on 03/13/2018. You may send your transmission at any time that day. If you have a wireless device, the transmission will be sent automatically. After your physician reviews your transmission, you will receive a postcard with your next transmission date.  Any Other Special Instructions Will Be Listed Below (If Applicable).  If you need a refill on your cardiac medications before your next appointment, please call your pharmacy.

## 2017-12-13 ENCOUNTER — Telehealth: Payer: Self-pay

## 2017-12-13 NOTE — Telephone Encounter (Signed)
Rcvd VM from [facility] requesting refill for Duloxetine. Called and spoke with Lelon Frohlich. I advised her I had spoken with her on 6/19 advising her Dr Thompson Grayer Rx'd this medication for the Pt. She will change to reflect Dr Rayann Heman.

## 2017-12-14 ENCOUNTER — Telehealth: Payer: Self-pay | Admitting: Neurology

## 2017-12-14 DIAGNOSIS — I890 Lymphedema, not elsewhere classified: Secondary | ICD-10-CM | POA: Diagnosis not present

## 2017-12-14 IMAGING — CR DG FACIAL BONES COMPLETE 3+V
3 series · 3 of 3 positions shown · non-contrast
Comparison: CT 10/31/2014.

CLINICAL DATA: Fall.  Pain.

EXAM:
FACIAL BONES COMPLETE 3+V

[w waters]
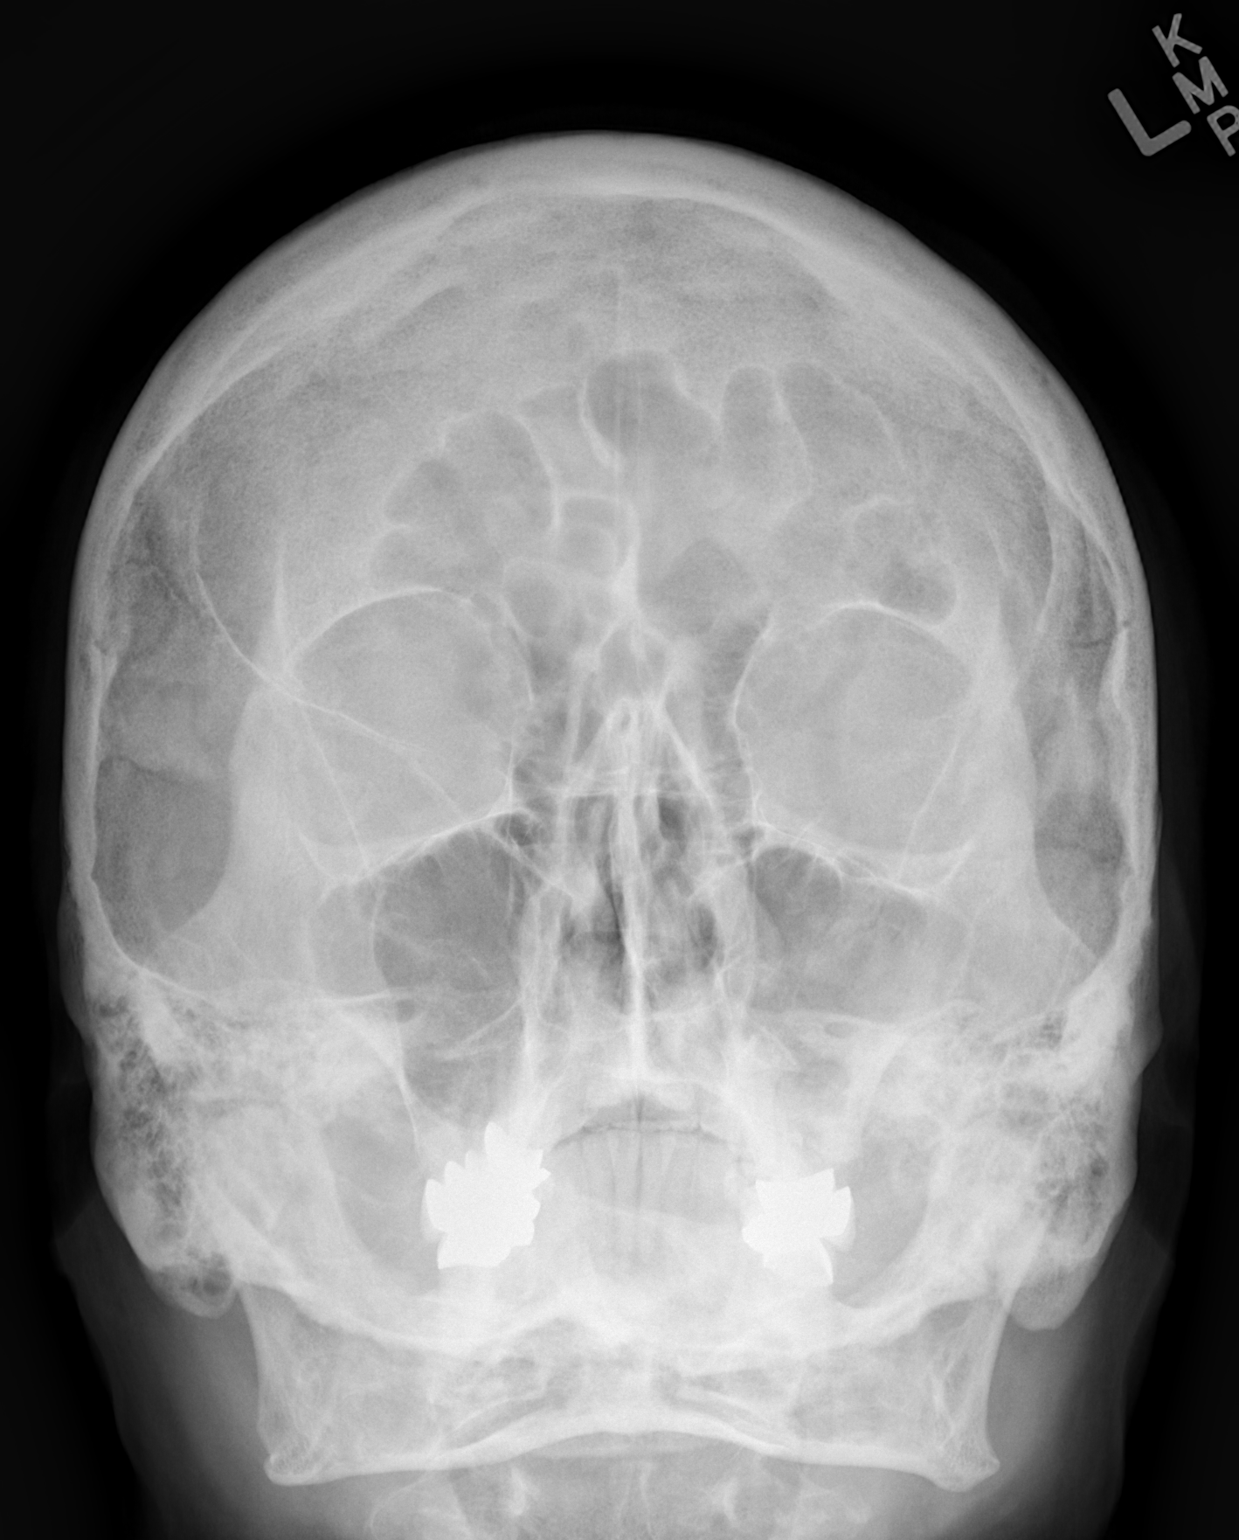

[[person_name]]
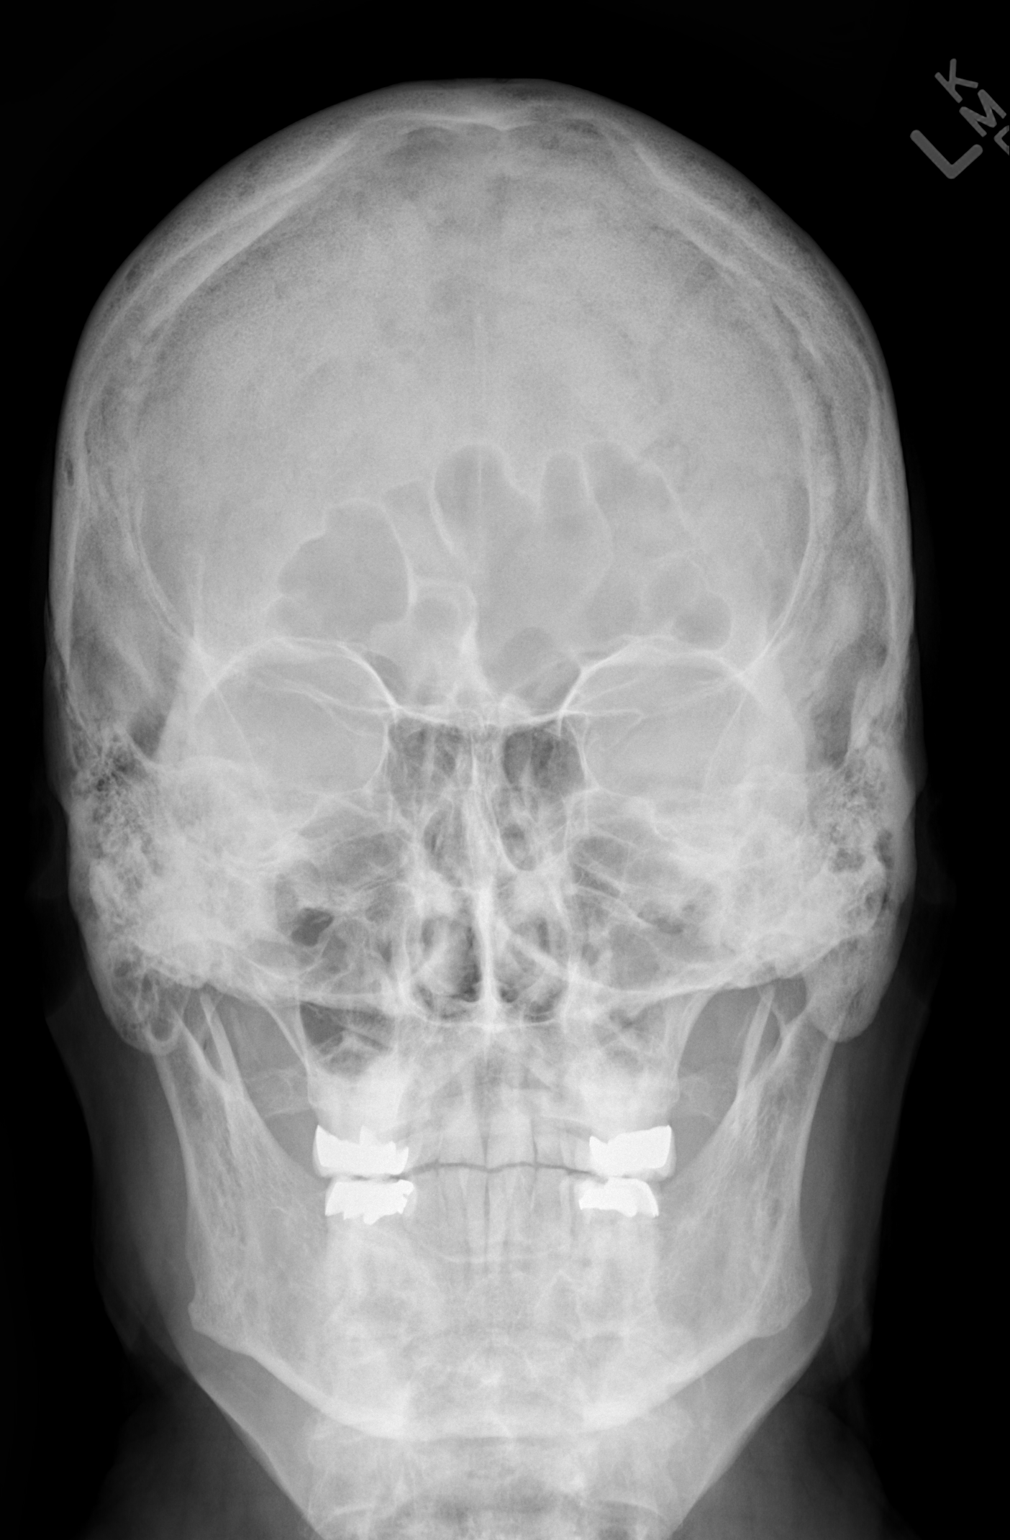

[w skull lat]
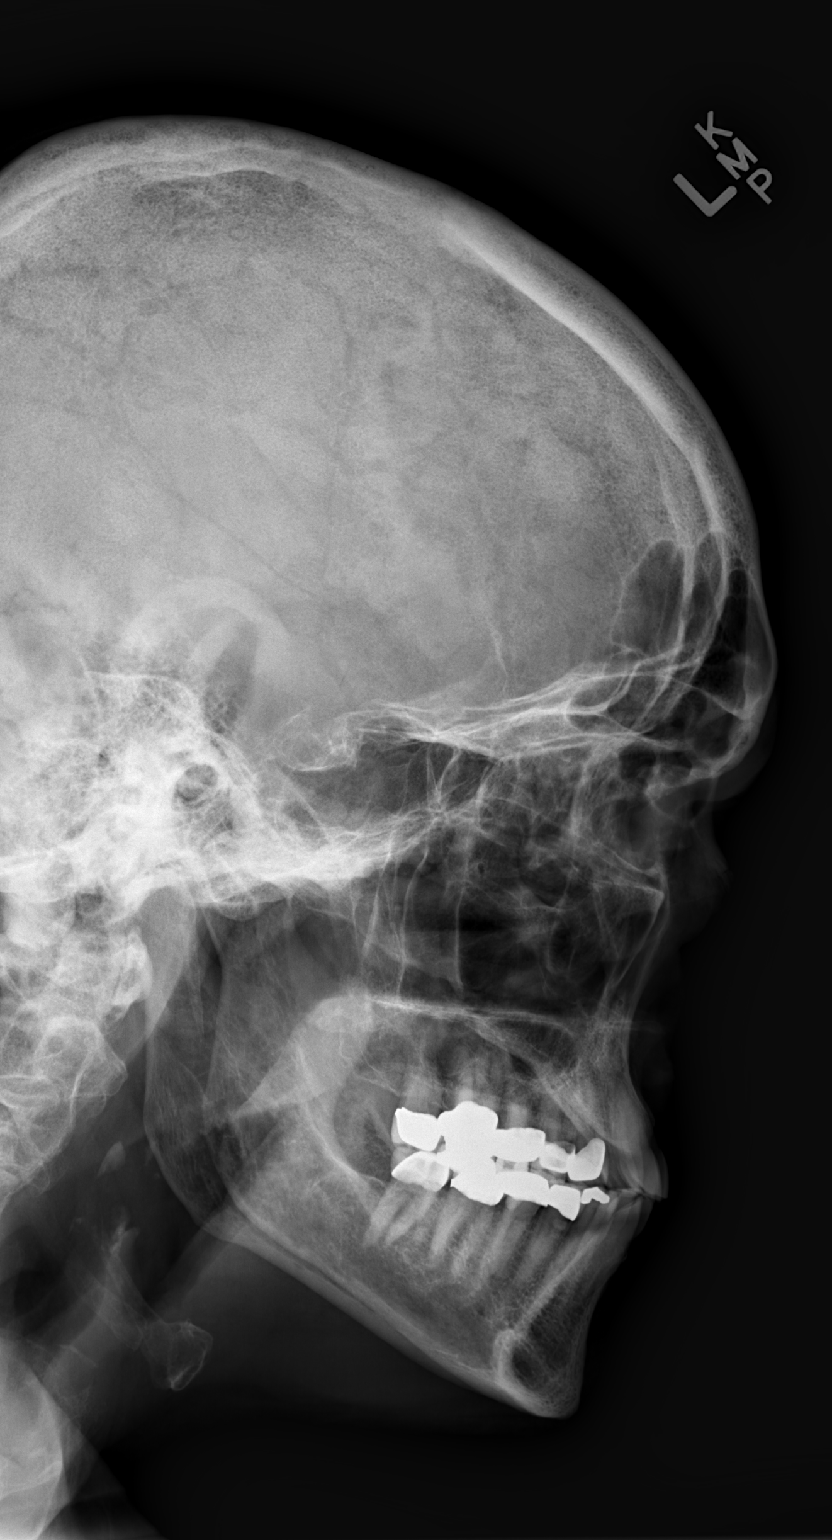

[3 of 3 positions shown; findings below may reference images not displayed]

FINDINGS: Paranasal sinuses are clear. Orbits are intact. Mandible is intact.
No acute bony abnormality identified. Subtle lucencies are noted in
the skull. Although these may represent benign vascular lakes a a
process such as myeloma or metastatic disease cannot be completely
excluded. Further evaluation with serum protein electrophoresis and
bone scan should be considered.
IMPRESSION: 1. No acute abnormality identified.

2. Subtle lucencies in the skull. Although the these may represent
benign vascular lakes, process such as myeloma or metastatic disease
cannot be excluded and serum protein electrophoresis and bone scan
should be considered for further evaluation.

## 2017-12-14 IMAGING — CR DG CHEST 2V
2 series · 2 of 2 positions shown · non-contrast
Comparison: 12/11/2014

CLINICAL DATA: Fall yesterday landing on anterior chest.

EXAM:
CHEST  2 VIEW

[w chest pa]
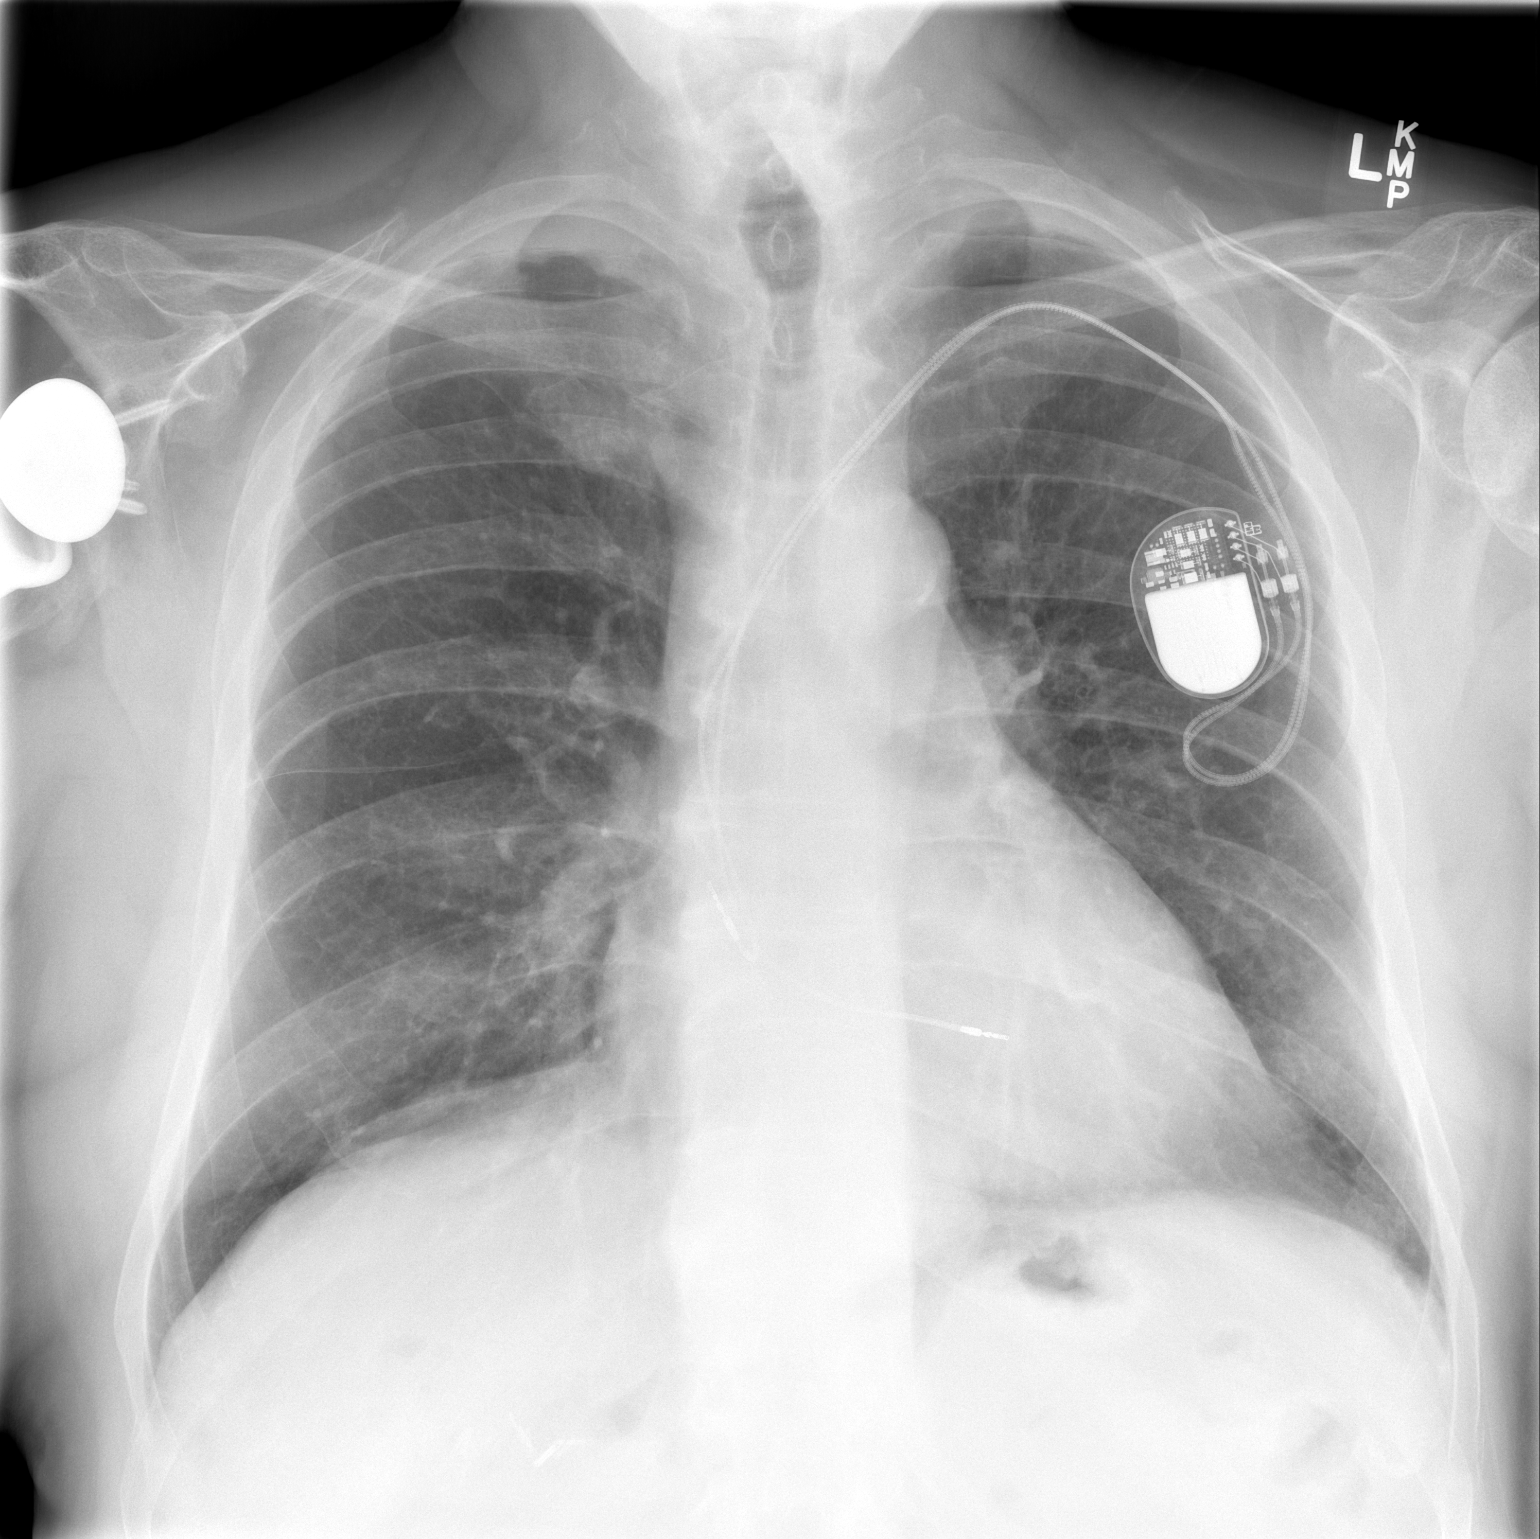

[w chest lat]
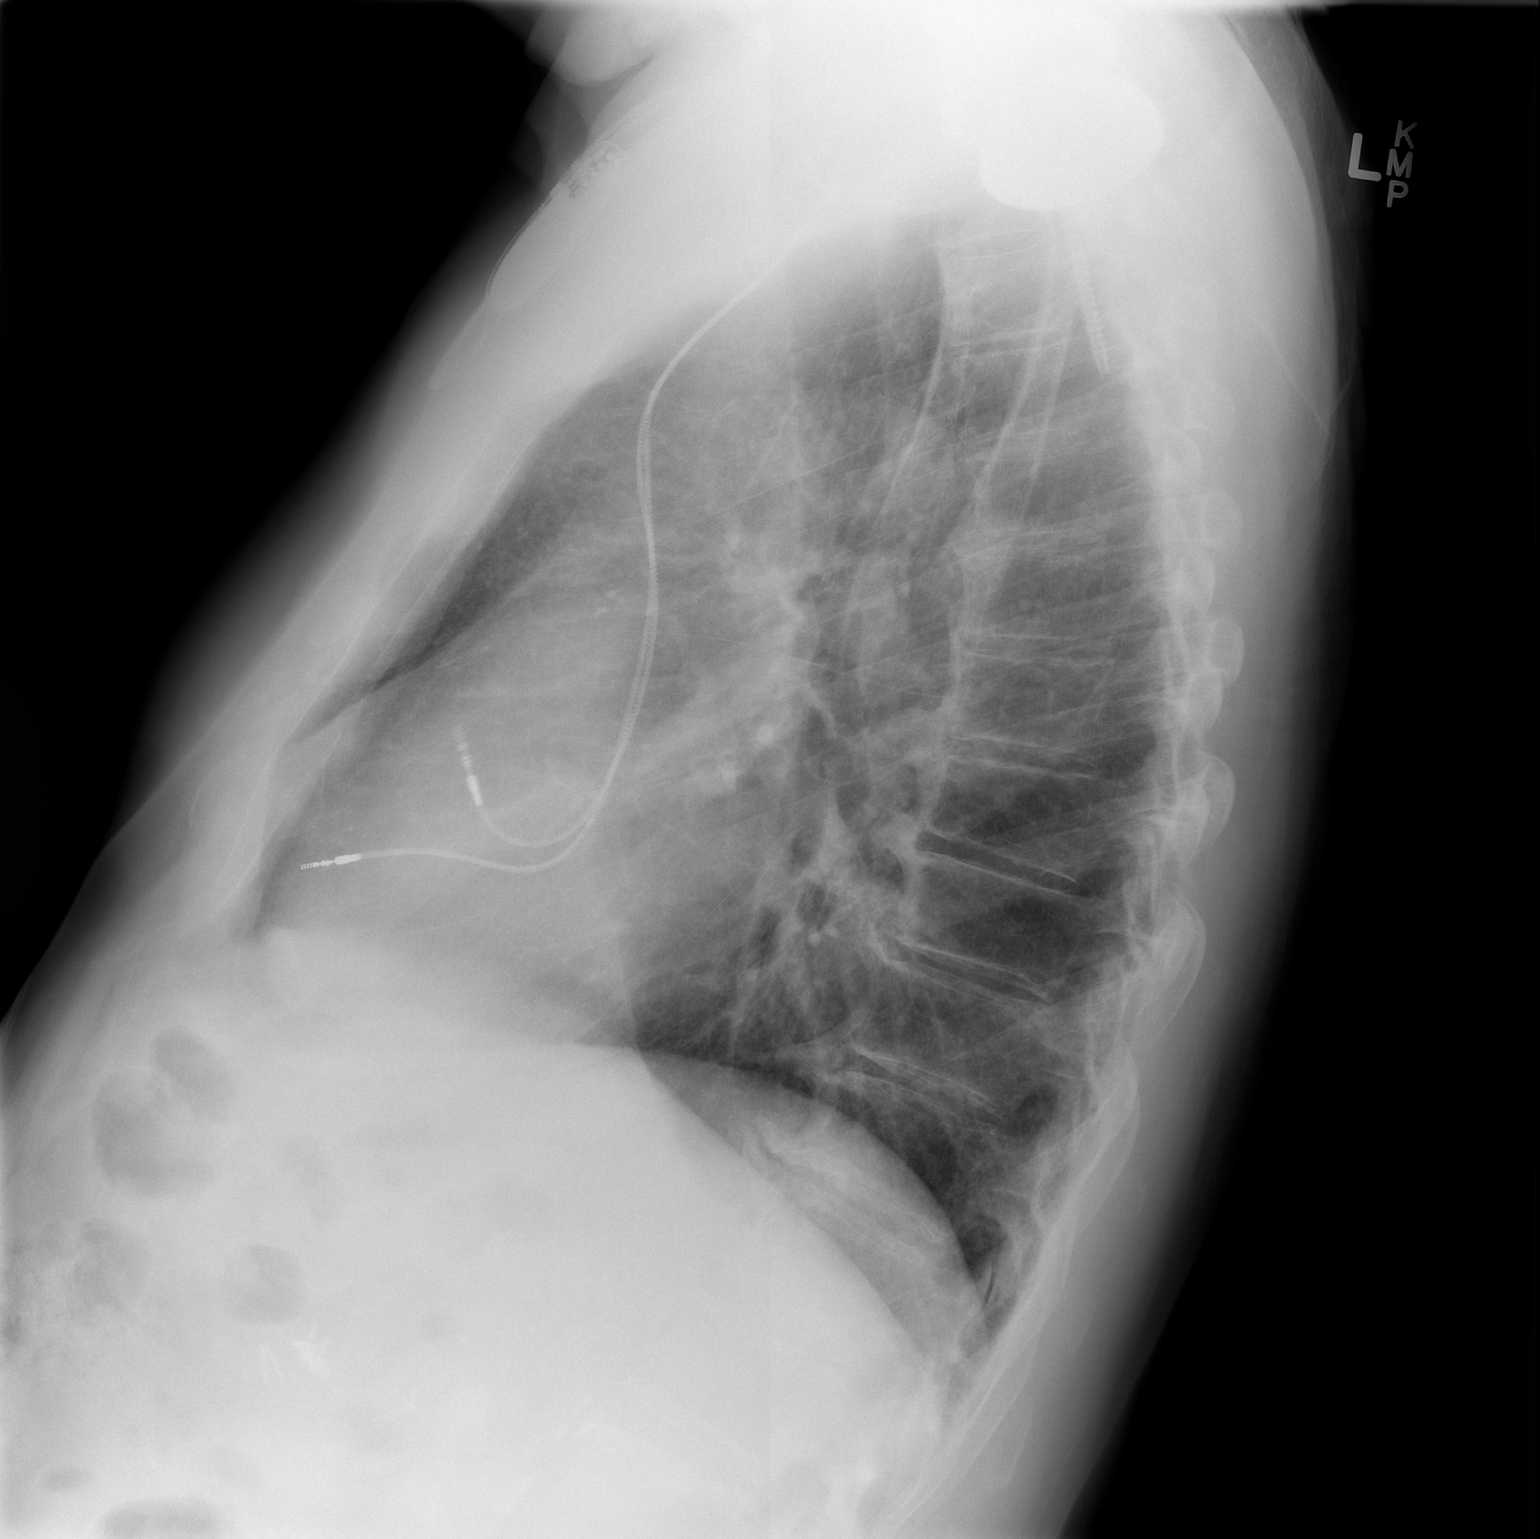

[2 of 2 positions shown; findings below may reference images not displayed]

FINDINGS: Left pacer remains in place, unchanged. Heart is borderline in size.
Biapical scarring/pleural thickening. No confluent opacities
otherwise. No effusions or acute bony abnormality.
IMPRESSION: Biapical scarring.  No active disease.

## 2017-12-14 MED ORDER — DULOXETINE HCL 30 MG PO CPEP: 30.0000 mg | ORAL_CAPSULE | Freq: Every day | ORAL | 5 refills | Status: DC

## 2017-12-14 NOTE — Telephone Encounter (Signed)
Patient wife called and LM on VM about the patient medication cymbalta. He is out of medication and the pharmacy [name]  has faxed over several request and called about getting the patient refill and they have not heard back from [place]. She would like this to be taken care of today

## 2017-12-14 NOTE — Telephone Encounter (Signed)
Called [name], spoke with Anderson Malta. I advised her I have spoken with Lelon Frohlich 2x about this. She states Dr Tomi Likens did Rx once this year. Advised her I can only only see where a PA has given refills. Gave verbal with R5 to last until next appt

## 2017-12-15 ENCOUNTER — Ambulatory Visit: Payer: PPO

## 2017-12-16 ENCOUNTER — Ambulatory Visit: Payer: PPO

## 2017-12-16 DIAGNOSIS — M545 Low back pain: Secondary | ICD-10-CM | POA: Diagnosis not present

## 2017-12-16 DIAGNOSIS — R2689 Other abnormalities of gait and mobility: Secondary | ICD-10-CM

## 2017-12-16 DIAGNOSIS — S7001XA Contusion of right hip, initial encounter: Secondary | ICD-10-CM | POA: Diagnosis not present

## 2017-12-16 NOTE — Therapy (Signed)
[facility] [address] [place], [place], [address] Phone: [phone]   Fax:  [phone]  Physical Therapy Treatment  Patient Details  Name: Christopher Baker MRN: [id number] Date of Birth: 10-22-1933 Referring Provider: Dr. Domenick Gong   Encounter Date: 12/16/2017  PT End of Session - 12/16/17 1602    Visit Number  1 no charge    Number of Visits  17    Date for PT Re-Evaluation  01/28/18    Authorization Type  [employer]    PT Start Time  7482    PT Stop Time  1600    PT Time Calculation (min)  14 min    Equipment Utilized During Treatment  -- min guard       Past Medical History:  Diagnosis Date  . Anxiety   . Arthritis    "hands, right hip; lower back" (02/28/2017)  . Arthritis with psoriasis ([place])   . Borderline diabetes    DIET CONTROLLED - PT STATES HE IS NOT DIABETIC  . Carotid stenosis, bilateral PER DR EARLY / DUPLEX  09-09-10     40 - 59%   BILATERALLY--- ASYMPTOMATIC  . Chronic lower back pain   . Depression   . Dysrhythmia    a-fib  . GERD (gastroesophageal reflux disease)    CONTROLLED W/ PROTONIX  . History of gout   . HTN (hypertension)   . Hyperlipidemia   . Iron deficiency   . Lumbar spondylosis W/ RADICULOPATHY  . OSA on CPAP   . PAF (paroxysmal atrial fibrillation) ([place])   . Presence of permanent cardiac pacemaker DDD-  06-04-10-   DDD; SECONDARY TO SYNCOPY AND BRADYCARDIA  . Prostate cancer ([place]) 2008   S/P 23 RADIATION  [place]    . Restless leg syndrome   . Stroke [facility]) Oct. 14, 2014   light left hand, balance issues, short term memory loss 2014  . SVT (supraventricular tachycardia) ([place])    S/P ABLATION   2008    Past Surgical History:  Procedure Laterality Date  . BACK SURGERY    . BIV UPGRADE N/A 09/07/2017   Procedure: BIV PACEMAKER UPGRADE;  Surgeon: Evans Lance, MD;  Location: [facility];  Service: Cardiovascular;  Laterality: N/A;  .  BREAST SURGERY     "removed tumor; don't remember which side; years ago" (02/28/2017)  . CARDIAC ELECTROPHYSIOLOGY STUDY AND ABLATION  2008   FOR SVT  . CARDIAC PACEMAKER PLACEMENT  06-04-10   DDD  . CARDIOVERSION N/A 03/02/2017   Procedure: CARDIOVERSION;  Surgeon: Josue Hector, MD;  Location: [facility] ENDOSCOPY;  Service: Cardiovascular;  Laterality: N/A;  . CATARACT EXTRACTION W/ INTRAOCULAR LENS  IMPLANT, BILATERAL Bilateral   . CHOLECYSTECTOMY  2009  . ESOPHAGOGASTRODUODENOSCOPY N/A 12/12/2014   Procedure: ESOPHAGOGASTRODUODENOSCOPY (EGD);  Surgeon: Wilford Corner, MD;  Location: [facility] ENDOSCOPY;  Service: Endoscopy;  Laterality: N/A;  . INCISION AND DRAINAGE Right 1997   "knee replacement got infected"  . INGUINAL HERNIA REPAIR Left 06/2003    DONE WITH PENILE PROSTESIS SURG.  . INGUINAL HERNIA REPAIR Right 1962  . JOINT REPLACEMENT    . KNEE ARTHROSCOPY  06/02/2011   Procedure: ARTHROSCOPY KNEE;  Surgeon: Gearlean Alf;  Location: [facility];  Service: Orthopedics;  Laterality: Left;  LEFT KNEE ARTHROSCOPY WITH DEBRIDEMENT  . LOOP RECORDER PLACEMENT  01-30-10  . LOOP RECORDER REMOVAL  06/04/2010  . LUMBAR LAMINECTOMY/ DISKECTOMY/ FUSION  05-19-10   L4 -  5  . PENILE PROSTHESIS IMPLANT  06/2003   AND LEFT CORPOROPLASTY /    DONE INGUINAL REPAIR  . PROSTATE BIOPSY  2007  . REVISION TOTAL KNEE ARTHROPLASTY Right 1997  . TOTAL KNEE ARTHROPLASTY  12/20/2011   Procedure: TOTAL KNEE ARTHROPLASTY;  Surgeon: Gearlean Alf, MD;  [facility];  Service: Orthopedics;  Laterality: Left;  . TOTAL KNEE ARTHROPLASTY Right 1997  . TOTAL SHOULDER ARTHROPLASTY Right 06/27/2015   Procedure: REVERSE TOTAL SHOULDER ARTHROPLASTY;  Surgeon: Netta Cedars, MD;  Location: [facility];  Service: Orthopedics;  Laterality: Right;  . TRANSURETHRAL RESECTION OF BLADDER TUMOR WITH GYRUS (TURBT-GYRUS)  ?2018  . TRANSURETHRAL RESECTION OF PROSTATE  2006    There were no vitals filed for this  visit.  Subjective Assessment - 12/16/17 1550    Subjective  Pt reported he fell on Sunday and fell on commode and hurt his R hip. He feels like it is broken.     Pertinent History  HTN, a-fib, CVA in 03/2013, OSA, RLS, vascular dementia, B THA, R shoulder replacement, carotid stenosis, Lx spondylosis, SVT, pacemaker, CHF (systolic), hx of gout, partial seizures    Patient Stated Goals  I want to be on the golf course by September, using a cane vs. rollator.     Currently in Pain?  Yes    Pain Score  6  6/10 at rest and 8/10 during amb.    Pain Location  Hip    Pain Orientation  Right    Pain Descriptors / Indicators  Sharp    Pain Type  Acute pain    Pain Onset  In the past 7 days    Pain Frequency  Constant    Aggravating Factors   weight bearing and walking    Pain Relieving Factors  "get off of it"                                 PT Short Term Goals - 11/29/17 1335      PT SHORT TERM GOAL #1   Title  pt will be IND with HEP in order to improve strength, balance, and posture. TARGET DATE FOR ALL LTGS: 12/27/17    Status  New      PT SHORT TERM GOAL #2   Title  Pt will perform TUG with LRAD to </=20 sec. to decr. falls risk.     Status  New      PT SHORT TERM GOAL #3   Title  Pt will improve gait speed with LRAD to >/=2.61ft/sec. to improve functional mobililty.     Status  New      PT SHORT TERM GOAL #4   Title  Perform BERG and write STG and LTG.    Status  New      PT SHORT TERM GOAL #5   Title  Pt will amb. 300' over even terrain with LRAD at MOD I level to improve functional mobility.     Status  New        PT Long Term Goals - 11/29/17 1337      PT LONG TERM GOAL #1   Title  Pt will amb. 600' over even/uneven surfaces with LRAD at MOD I level to improve functional mobility. TARGET DATE FOR ALL LTGS: 01/24/18    Status  New      PT LONG TERM GOAL #2   Title  Pt will improve  gait speed with LRAD to >/=2.5ft/sec. to safely amb. in the  community.     Status  New      PT LONG TERM GOAL #3   Title  Trial cane and set goal for household mobility if appropriate.     Status  New      PT LONG TERM GOAL #4   Title  Pt will perform TUG with LRAD in </=13.5 sec. to decr. falls risk.     Status  New            Plan - 12/16/17 1603    Clinical Impression Statement  No charge for visit, as pt reported severe R hip pain s/p fall on Sunday night. Pt hit the commode and was TTP to R posterior hip with incr. in pain during weight bearing. PT encouraged pt's wife to call PCP or see orthopedic MD to assess for R hip fx. [name] called and left a mesage for MD. PT educated pt's wife that we would need a return to therapy order or need to hold if pt's R hip does have a fracture.     Rehab Potential  Good    Clinical Impairments Affecting Rehab Potential  see above.    PT Frequency  2x / week    PT Duration  8 weeks    PT Treatment/Interventions  ADLs/Self Care Home Management;Biofeedback;Canalith Repostioning;Therapeutic exercise;Therapeutic activities;Manual techniques;Vestibular;Orthotic Fit/Training;Functional mobility training;Stair training;Gait training;Patient/family education;DME Instruction;Cognitive remediation;Neuromuscular re-education;Balance training    PT Next Visit Plan  Have a return to therapy order or hold if R hip fx. Perform BERG and write goal, initiate OTAGO HEP.     Consulted and Agree with Plan of Care  Patient       Patient will benefit from skilled therapeutic intervention in order to improve the following deficits and impairments:  Abnormal gait, Decreased endurance, Decreased knowledge of use of DME, Decreased strength, Decreased balance, Decreased mobility, Decreased cognition, Decreased range of motion, Impaired flexibility, Postural dysfunction, Decreased coordination(PT will not directly treat back pain but will monitor closely)  Visit Diagnosis: Other abnormalities of gait and  mobility     Problem List Patient Active Problem List   Diagnosis Date Noted  . Sepsis, unspecified organism ([place]) 10/30/2017  . Acute urinary retention 10/30/2017  . Chronic systolic heart failure ([place]) 09/07/2017  . S/P shoulder replacement 06/27/2015  . Residual cognitive deficit as late effect of stroke 04/15/2015  . Lung nodule   . Weakness generalized 12/11/2014  . Leukocytosis 12/11/2014  . Restless leg syndrome 12/11/2014  . Normocytic anemia 12/11/2014  . Abnormal EKG 12/11/2014  . Gait disturbance, post-stroke 10/22/2014  . Partial seizures ([place]) 06/18/2014  . Vitamin B12 deficiency 06/18/2014  . OSA (obstructive sleep apnea) 02/26/2014  . Cognitive deficits as late effect of cerebrovascular disease 01/11/2014  . Fatigue 09/20/2013  . Intermittent lightheadedness 06/19/2013  . Low blood pressure, not hypotension 06/19/2013  . Vascular dementia 04/09/2013  . History of completed stroke 04/03/2013  . Occlusion and stenosis of carotid artery without mention of cerebral infarction 11/21/2012  . OA (osteoarthritis) of knee 12/20/2011  . Methotrexate, long term, current use 09/10/2011  . Knee pain, left 07/27/2011  . Pacemaker-Medtronic 09/08/2010  . UNSPECIFIED VISUAL DISTURBANCE 07/20/2010  . IRON DEFIC ANEMIA [place] DIET IRON INTAKE 04/20/2010  . PSORIASIS 04/20/2010  . DISUSE OSTEOPOROSIS 05/12/2009  . SPONDYLOSIS, LUMBAR, WITH RADICULOPATHY 10/15/2008  . OTHER SPECIFIED DERMATOMYCOSES 06/24/2008  . VARICOSE VEINS LOWER EXTREMITIES W/INFLAMMATION 12/25/2007  . INSOMNIA  WITH SLEEP APNEA UNSPECIFIED 08/31/2007  . GERD 03/23/2007  . Atrial fibrillation, chronic ([place]) 01/31/2007  . PSORIATIC ARTHRITIS 01/31/2007  . Hyperlipidemia 01/19/2007  . Essential hypertension 01/19/2007  . PROSTATE CANCER, HX OF 01/19/2007    Syria Kestner L 12/16/2017, 4:05 PM  [facility] [address] [place], [place],  [address] Phone: [phone]   Fax:  [phone]  Name: ETHANIEL GARFIELD MRN: [id number] Date of Birth: 1933-10-01  Geoffry Paradise, PT,DPT 12/16/17 4:05 PM Phone: [phone] Fax: [phone]

## 2017-12-20 ENCOUNTER — Encounter: Payer: Self-pay | Admitting: Rehabilitation

## 2017-12-20 ENCOUNTER — Ambulatory Visit: Payer: PPO | Attending: Internal Medicine | Admitting: Rehabilitation

## 2017-12-20 DIAGNOSIS — R2689 Other abnormalities of gait and mobility: Secondary | ICD-10-CM

## 2017-12-20 DIAGNOSIS — M6281 Muscle weakness (generalized): Secondary | ICD-10-CM | POA: Insufficient documentation

## 2017-12-20 DIAGNOSIS — R2681 Unsteadiness on feet: Secondary | ICD-10-CM | POA: Insufficient documentation

## 2017-12-20 NOTE — Therapy (Signed)
[facility] [address] [place], [place], [address] Phone: [phone]   Fax:  [phone]  Physical Therapy Treatment  Patient Details  Name: Christopher Baker MRN: [id number] Date of Birth: 01/23/1934 Referring Provider: Dr. Domenick Gong   Encounter Date: 12/20/2017  PT End of Session - 12/20/17 1336    Visit Number  2    Number of Visits  17    Date for PT Re-Evaluation  01/28/18    Authorization Type  [employer]    PT Start Time  5027 pt late to session    PT Stop Time  1145    PT Time Calculation (min)  33 min    Equipment Utilized During Treatment  -- min guard    Activity Tolerance  Patient limited by pain    Behavior During Therapy  [facility] for tasks assessed/performed       Past Medical History:  Diagnosis Date  . Anxiety   . Arthritis    "hands, right hip; lower back" (02/28/2017)  . Arthritis with psoriasis ([place])   . Borderline diabetes    DIET CONTROLLED - PT STATES HE IS NOT DIABETIC  . Carotid stenosis, bilateral PER DR EARLY / DUPLEX  09-09-10     40 - 59%   BILATERALLY--- ASYMPTOMATIC  . Chronic lower back pain   . Depression   . Dysrhythmia    a-fib  . GERD (gastroesophageal reflux disease)    CONTROLLED W/ PROTONIX  . History of gout   . HTN (hypertension)   . Hyperlipidemia   . Iron deficiency   . Lumbar spondylosis W/ RADICULOPATHY  . OSA on CPAP   . PAF (paroxysmal atrial fibrillation) ([place])   . Presence of permanent cardiac pacemaker DDD-  06-04-10-   DDD; SECONDARY TO SYNCOPY AND BRADYCARDIA  . Prostate cancer ([place]) 2008   S/P 55 RADIATION  [place]    . Restless leg syndrome   . Stroke [facility]) Oct. 14, 2014   light left hand, balance issues, short term memory loss 2014  . SVT (supraventricular tachycardia) ([place])    S/P ABLATION   2008    Past Surgical History:  Procedure Laterality Date  . BACK SURGERY    . BIV UPGRADE N/A 09/07/2017   Procedure: BIV PACEMAKER  UPGRADE;  Surgeon: Evans Lance, MD;  Location: [facility];  Service: Cardiovascular;  Laterality: N/A;  . BREAST SURGERY     "removed tumor; don't remember which side; years ago" (02/28/2017)  . CARDIAC ELECTROPHYSIOLOGY STUDY AND ABLATION  2008   FOR SVT  . CARDIAC PACEMAKER PLACEMENT  06-04-10   DDD  . CARDIOVERSION N/A 03/02/2017   Procedure: CARDIOVERSION;  Surgeon: Josue Hector, MD;  Location: [facility] ENDOSCOPY;  Service: Cardiovascular;  Laterality: N/A;  . CATARACT EXTRACTION W/ INTRAOCULAR LENS  IMPLANT, BILATERAL Bilateral   . CHOLECYSTECTOMY  2009  . ESOPHAGOGASTRODUODENOSCOPY N/A 12/12/2014   Procedure: ESOPHAGOGASTRODUODENOSCOPY (EGD);  Surgeon: Wilford Corner, MD;  Location: [facility] ENDOSCOPY;  Service: Endoscopy;  Laterality: N/A;  . INCISION AND DRAINAGE Right 1997   "knee replacement got infected"  . INGUINAL HERNIA REPAIR Left 06/2003    DONE WITH PENILE PROSTESIS SURG.  . INGUINAL HERNIA REPAIR Right 1962  . JOINT REPLACEMENT    . KNEE ARTHROSCOPY  06/02/2011   Procedure: ARTHROSCOPY KNEE;  Surgeon: Gearlean Alf;  Location: [facility];  Service: Orthopedics;  Laterality: Left;  LEFT KNEE ARTHROSCOPY WITH DEBRIDEMENT  .  LOOP RECORDER PLACEMENT  01-30-10  . LOOP RECORDER REMOVAL  06/04/2010  . LUMBAR LAMINECTOMY/ DISKECTOMY/ FUSION  05-19-10   L4 - 5  . PENILE PROSTHESIS IMPLANT  06/2003   AND LEFT CORPOROPLASTY /    DONE INGUINAL REPAIR  . PROSTATE BIOPSY  2007  . REVISION TOTAL KNEE ARTHROPLASTY Right 1997  . TOTAL KNEE ARTHROPLASTY  12/20/2011   Procedure: TOTAL KNEE ARTHROPLASTY;  Surgeon: Gearlean Alf, MD;  Location: [facility];  Service: Orthopedics;  Laterality: Left;  . TOTAL KNEE ARTHROPLASTY Right 1997  . TOTAL SHOULDER ARTHROPLASTY Right 06/27/2015   Procedure: REVERSE TOTAL SHOULDER ARTHROPLASTY;  Surgeon: Netta Cedars, MD;  Location: [facility];  Service: Orthopedics;  Laterality: Right;  . TRANSURETHRAL RESECTION OF BLADDER TUMOR WITH GYRUS  (TURBT-GYRUS)  ?2018  . TRANSURETHRAL RESECTION OF PROSTATE  2006    There were no vitals filed for this visit.  Subjective Assessment - 12/20/17 1115    Subjective  Pt reports no falls since last visit. They did go to ortho urgent care, no fractures.     Patient is accompained by:  Family member    Pertinent History  HTN, a-fib, CVA in 03/2013, OSA, RLS, vascular dementia, B THA, R shoulder replacement, carotid stenosis, Lx spondylosis, SVT, pacemaker, CHF (systolic), hx of gout, partial seizures    Patient Stated Goals  I want to be on the golf course by September, using a cane vs. rollator.     Currently in Pain?  Yes    Pain Score  7     Pain Location  Back    Pain Orientation  Right    Pain Descriptors / Indicators  Sharp    Pain Type  Acute pain    Pain Onset  More than a month ago    Pain Frequency  Constant    Aggravating Factors   walking    Pain Relieving Factors  repositioning, ice pack                        [facility] Adult PT Treatment/Exercise - 12/20/17 1123      Standardized Balance Assessment   Standardized Balance Assessment  Berg Balance Test      Berg Balance Test   Sit to Stand  Able to stand  independently using hands    Standing Unsupported  Able to stand 2 minutes with supervision    Sitting with Back Unsupported but Feet Supported on Floor or Stool  Able to sit safely and securely 2 minutes    Stand to Sit  Controls descent by using hands    Transfers  Able to transfer safely, minor use of hands    Standing Unsupported with Eyes Closed  Needs help to keep from falling    Standing Ubsupported with Feet Together  Needs help to attain position and unable to hold for 15 seconds    From Standing, Reach Forward with Outstretched Arm  Loses balance while trying/requires external support    From Standing Position, Pick up Object from Floor  Unable to try/needs assist to keep balance    From Standing Position, Turn to Look Behind Over each Shoulder   Needs supervision when turning    Turn 360 Degrees  Needs assistance while turning    Standing Unsupported, Alternately Place Feet on Step/Stool  Needs assistance to keep from falling or unable to try    Standing Unsupported, One Foot in Front  Needs help to step but can  hold 15 seconds    Standing on One Leg  Unable to try or needs assist to prevent fall    Total Score  19       Pt needing frequent rest breaks due to fatigue and low back pain.  Performed seated forward flexion at seated breaks x 5 reps with 15 sec holds.  Also had pt perform seated knee to chest stretch x 15 secs on each side.        PT Education - 12/20/17 1336    Education Details  BERG balance test results/interpretation    Person(s) Educated  Patient;Spouse    Methods  Explanation    Comprehension  Verbalized understanding       PT Short Term Goals - 12/20/17 1338      PT SHORT TERM GOAL #1   Title  pt will be IND with HEP in order to improve strength, balance, and posture. TARGET DATE FOR ALL LTGS: 12/27/17    Status  New      PT SHORT TERM GOAL #2   Title  Pt will perform TUG with LRAD to </=20 sec. to decr. falls risk.     Status  New      PT SHORT TERM GOAL #3   Title  Pt will improve gait speed with LRAD to >/=2.50ft/sec. to improve functional mobililty.     Status  New      PT SHORT TERM GOAL #4   Title  Perform BERG and write STG and LTG.    Baseline  19/56 on 12/20/17    Status  Achieved      PT SHORT TERM GOAL #5   Title  Pt will amb. 300' over even terrain with LRAD at MOD I level to improve functional mobility.     Status  New      Additional Short Term Goals   Additional Short Term Goals  Yes      PT SHORT TERM GOAL #6   Title  Pt will improve BERG balance score to 22/56 in order to indicate decreased fall risk.      Time  4    Period  Weeks    Status  New        PT Long Term Goals - 12/20/17 1339      PT LONG TERM GOAL #1   Title  Pt will amb. 600' over even/uneven surfaces  with LRAD at MOD I level to improve functional mobility. TARGET DATE FOR ALL LTGS: 01/24/18    Status  New      PT LONG TERM GOAL #2   Title  Pt will improve gait speed with LRAD to >/=2.11ft/sec. to safely amb. in the community.     Status  New      PT LONG TERM GOAL #3   Title  Trial cane and set goal for household mobility if appropriate.     Status  New      PT LONG TERM GOAL #4   Title  Pt will perform TUG with LRAD in </=13.5 sec. to decr. falls risk.     Status  New      PT LONG TERM GOAL #5   Title  Pt will improve BERG balance score to 25/56 in order to indicate decreased fall risk.      Time  8    Period  Weeks    Status  New            Plan - 12/20/17  1337    Clinical Impression Statement  Pt/wife reporting xrays revealed no fractures from fall, however pt still very sore in low back and into R hip.  Assessed BERG balance test during session with score of 19/56.  Pt needing several seated rest breaks and education on how to stretch low back during rest breaks.  Pt is at 100% risk of having fall and educated on need for walker and goals to improve balance over his PT POC.  Pt and wife verbalized understanding.     Rehab Potential  Good    Clinical Impairments Affecting Rehab Potential  see above.    PT Frequency  2x / week    PT Duration  8 weeks    PT Treatment/Interventions  ADLs/Self Care Home Management;Biofeedback;Canalith Repostioning;Therapeutic exercise;Therapeutic activities;Manual techniques;Vestibular;Orthotic Fit/Training;Functional mobility training;Stair training;Gait training;Patient/family education;DME Instruction;Cognitive remediation;Neuromuscular re-education;Balance training    PT Next Visit Plan  [place]    Consulted and Agree with Plan of Care  Patient       Patient will benefit from skilled therapeutic intervention in order to improve the following deficits and impairments:  Abnormal gait, Decreased endurance, Decreased knowledge of use of DME,  Decreased strength, Decreased balance, Decreased mobility, Decreased cognition, Decreased range of motion, Impaired flexibility, Postural dysfunction, Decreased coordination(PT will not directly treat back pain but will monitor closely)  Visit Diagnosis: Other abnormalities of gait and mobility  Unsteadiness on feet  Muscle weakness (generalized)     Problem List Patient Active Problem List   Diagnosis Date Noted  . Sepsis, unspecified organism ([place]) 10/30/2017  . Acute urinary retention 10/30/2017  . Chronic systolic heart failure ([place]) 09/07/2017  . S/P shoulder replacement 06/27/2015  . Residual cognitive deficit as late effect of stroke 04/15/2015  . Lung nodule   . Weakness generalized 12/11/2014  . Leukocytosis 12/11/2014  . Restless leg syndrome 12/11/2014  . Normocytic anemia 12/11/2014  . Abnormal EKG 12/11/2014  . Gait disturbance, post-stroke 10/22/2014  . Partial seizures ([place]) 06/18/2014  . Vitamin B12 deficiency 06/18/2014  . OSA (obstructive sleep apnea) 02/26/2014  . Cognitive deficits as late effect of cerebrovascular disease 01/11/2014  . Fatigue 09/20/2013  . Intermittent lightheadedness 06/19/2013  . Low blood pressure, not hypotension 06/19/2013  . Vascular dementia 04/09/2013  . History of completed stroke 04/03/2013  . Occlusion and stenosis of carotid artery without mention of cerebral infarction 11/21/2012  . OA (osteoarthritis) of knee 12/20/2011  . Methotrexate, long term, current use 09/10/2011  . Knee pain, left 07/27/2011  . Pacemaker-[employer] 09/08/2010  . UNSPECIFIED VISUAL DISTURBANCE 07/20/2010  . IRON DEFIC ANEMIA [place] DIET IRON INTAKE 04/20/2010  . PSORIASIS 04/20/2010  . DISUSE OSTEOPOROSIS 05/12/2009  . SPONDYLOSIS, LUMBAR, WITH RADICULOPATHY 10/15/2008  . OTHER SPECIFIED DERMATOMYCOSES 06/24/2008  . VARICOSE VEINS LOWER EXTREMITIES W/INFLAMMATION 12/25/2007  . INSOMNIA WITH SLEEP APNEA UNSPECIFIED 08/31/2007  . GERD 03/23/2007   . Atrial fibrillation, chronic ([place]) 01/31/2007  . PSORIATIC ARTHRITIS 01/31/2007  . Hyperlipidemia 01/19/2007  . Essential hypertension 01/19/2007  . PROSTATE CANCER, HX OF 01/19/2007    Cameron Sprang, PT, MPT [facility] [address] [place], [place], [address] Phone: [phone]   Fax:  [phone] 12/20/17, 1:41 PM  Name: Christopher Baker MRN: [id number] Date of Birth: September 07, 1933

## 2017-12-21 DIAGNOSIS — G4733 Obstructive sleep apnea (adult) (pediatric): Secondary | ICD-10-CM | POA: Diagnosis not present

## 2017-12-23 ENCOUNTER — Inpatient Hospital Stay (HOSPITAL_COMMUNITY)
Admission: EM | Admit: 2017-12-23 | Discharge: 2017-12-30 | DRG: 871 | Disposition: A | Discharge status: Rehabilitation Facility | Payer: PPO | Attending: Internal Medicine | Admitting: Internal Medicine

## 2017-12-23 ENCOUNTER — Encounter (HOSPITAL_COMMUNITY): Payer: Self-pay | Admitting: Emergency Medicine

## 2017-12-23 ENCOUNTER — Other Ambulatory Visit: Payer: Self-pay

## 2017-12-23 ENCOUNTER — Inpatient Hospital Stay (HOSPITAL_COMMUNITY): Payer: PPO

## 2017-12-23 ENCOUNTER — Emergency Department (HOSPITAL_COMMUNITY): Payer: PPO

## 2017-12-23 DIAGNOSIS — G2581 Restless legs syndrome: Secondary | ICD-10-CM | POA: Diagnosis not present

## 2017-12-23 DIAGNOSIS — I10 Essential (primary) hypertension: Secondary | ICD-10-CM

## 2017-12-23 DIAGNOSIS — N390 Urinary tract infection, site not specified: Secondary | ICD-10-CM | POA: Diagnosis present

## 2017-12-23 DIAGNOSIS — Z8546 Personal history of malignant neoplasm of prostate: Secondary | ICD-10-CM

## 2017-12-23 DIAGNOSIS — L4050 Arthropathic psoriasis, unspecified: Secondary | ICD-10-CM | POA: Diagnosis present

## 2017-12-23 DIAGNOSIS — R0602 Shortness of breath: Secondary | ICD-10-CM | POA: Diagnosis not present

## 2017-12-23 DIAGNOSIS — E46 Unspecified protein-calorie malnutrition: Secondary | ICD-10-CM | POA: Diagnosis not present

## 2017-12-23 DIAGNOSIS — I472 Ventricular tachycardia, unspecified: Secondary | ICD-10-CM

## 2017-12-23 DIAGNOSIS — Z961 Presence of intraocular lens: Secondary | ICD-10-CM | POA: Diagnosis present

## 2017-12-23 DIAGNOSIS — B9620 Unspecified Escherichia coli [E. coli] as the cause of diseases classified elsewhere: Secondary | ICD-10-CM | POA: Diagnosis present

## 2017-12-23 DIAGNOSIS — J9690 Respiratory failure, unspecified, unspecified whether with hypoxia or hypercapnia: Secondary | ICD-10-CM | POA: Diagnosis not present

## 2017-12-23 DIAGNOSIS — Z881 Allergy status to other antibiotic agents status: Secondary | ICD-10-CM | POA: Diagnosis not present

## 2017-12-23 DIAGNOSIS — N3001 Acute cystitis with hematuria: Secondary | ICD-10-CM

## 2017-12-23 DIAGNOSIS — I5022 Chronic systolic (congestive) heart failure: Secondary | ICD-10-CM | POA: Diagnosis not present

## 2017-12-23 DIAGNOSIS — Z95810 Presence of automatic (implantable) cardiac defibrillator: Secondary | ICD-10-CM | POA: Diagnosis not present

## 2017-12-23 DIAGNOSIS — R339 Retention of urine, unspecified: Secondary | ICD-10-CM | POA: Diagnosis present

## 2017-12-23 DIAGNOSIS — I5023 Acute on chronic systolic (congestive) heart failure: Secondary | ICD-10-CM | POA: Diagnosis not present

## 2017-12-23 DIAGNOSIS — I130 Hypertensive heart and chronic kidney disease with heart failure and stage 1 through stage 4 chronic kidney disease, or unspecified chronic kidney disease: Secondary | ICD-10-CM | POA: Diagnosis not present

## 2017-12-23 DIAGNOSIS — M961 Postlaminectomy syndrome, not elsewhere classified: Secondary | ICD-10-CM | POA: Diagnosis not present

## 2017-12-23 DIAGNOSIS — R5383 Other fatigue: Secondary | ICD-10-CM | POA: Diagnosis not present

## 2017-12-23 DIAGNOSIS — G4733 Obstructive sleep apnea (adult) (pediatric): Secondary | ICD-10-CM | POA: Diagnosis present

## 2017-12-23 DIAGNOSIS — A419 Sepsis, unspecified organism: Secondary | ICD-10-CM | POA: Diagnosis present

## 2017-12-23 DIAGNOSIS — I350 Nonrheumatic aortic (valve) stenosis: Secondary | ICD-10-CM | POA: Diagnosis present

## 2017-12-23 DIAGNOSIS — I4891 Unspecified atrial fibrillation: Secondary | ICD-10-CM | POA: Diagnosis not present

## 2017-12-23 DIAGNOSIS — F0150 Vascular dementia without behavioral disturbance: Secondary | ICD-10-CM | POA: Diagnosis not present

## 2017-12-23 DIAGNOSIS — Z9842 Cataract extraction status, left eye: Secondary | ICD-10-CM

## 2017-12-23 DIAGNOSIS — I462 Cardiac arrest due to underlying cardiac condition: Secondary | ICD-10-CM | POA: Diagnosis not present

## 2017-12-23 DIAGNOSIS — I110 Hypertensive heart disease with heart failure: Secondary | ICD-10-CM | POA: Diagnosis present

## 2017-12-23 DIAGNOSIS — Z7951 Long term (current) use of inhaled steroids: Secondary | ICD-10-CM

## 2017-12-23 DIAGNOSIS — Z96611 Presence of right artificial shoulder joint: Secondary | ICD-10-CM | POA: Diagnosis present

## 2017-12-23 DIAGNOSIS — D72829 Elevated white blood cell count, unspecified: Secondary | ICD-10-CM | POA: Diagnosis not present

## 2017-12-23 DIAGNOSIS — R627 Adult failure to thrive: Secondary | ICD-10-CM | POA: Diagnosis present

## 2017-12-23 DIAGNOSIS — R7881 Bacteremia: Secondary | ICD-10-CM | POA: Diagnosis present

## 2017-12-23 DIAGNOSIS — Z981 Arthrodesis status: Secondary | ICD-10-CM

## 2017-12-23 DIAGNOSIS — Z885 Allergy status to narcotic agent status: Secondary | ICD-10-CM | POA: Diagnosis not present

## 2017-12-23 DIAGNOSIS — D62 Acute posthemorrhagic anemia: Secondary | ICD-10-CM | POA: Diagnosis not present

## 2017-12-23 DIAGNOSIS — I69393 Ataxia following cerebral infarction: Secondary | ICD-10-CM | POA: Diagnosis not present

## 2017-12-23 DIAGNOSIS — Z91013 Allergy to seafood: Secondary | ICD-10-CM | POA: Diagnosis not present

## 2017-12-23 DIAGNOSIS — J810 Acute pulmonary edema: Secondary | ICD-10-CM | POA: Diagnosis not present

## 2017-12-23 DIAGNOSIS — R092 Respiratory arrest: Secondary | ICD-10-CM

## 2017-12-23 DIAGNOSIS — R0989 Other specified symptoms and signs involving the circulatory and respiratory systems: Secondary | ICD-10-CM | POA: Diagnosis not present

## 2017-12-23 DIAGNOSIS — R6883 Chills (without fever): Secondary | ICD-10-CM | POA: Diagnosis not present

## 2017-12-23 DIAGNOSIS — R1310 Dysphagia, unspecified: Secondary | ICD-10-CM | POA: Diagnosis present

## 2017-12-23 DIAGNOSIS — Z7982 Long term (current) use of aspirin: Secondary | ICD-10-CM

## 2017-12-23 DIAGNOSIS — I469 Cardiac arrest, cause unspecified: Secondary | ICD-10-CM | POA: Diagnosis not present

## 2017-12-23 DIAGNOSIS — R7303 Prediabetes: Secondary | ICD-10-CM | POA: Diagnosis not present

## 2017-12-23 DIAGNOSIS — Z7901 Long term (current) use of anticoagulants: Secondary | ICD-10-CM | POA: Diagnosis not present

## 2017-12-23 DIAGNOSIS — Z7952 Long term (current) use of systemic steroids: Secondary | ICD-10-CM

## 2017-12-23 DIAGNOSIS — R5381 Other malaise: Secondary | ICD-10-CM | POA: Diagnosis not present

## 2017-12-23 DIAGNOSIS — Z9841 Cataract extraction status, right eye: Secondary | ICD-10-CM

## 2017-12-23 DIAGNOSIS — R310 Gross hematuria: Secondary | ICD-10-CM | POA: Diagnosis not present

## 2017-12-23 DIAGNOSIS — F0390 Unspecified dementia without behavioral disturbance: Secondary | ICD-10-CM | POA: Diagnosis not present

## 2017-12-23 DIAGNOSIS — I481 Persistent atrial fibrillation: Secondary | ICD-10-CM | POA: Diagnosis present

## 2017-12-23 DIAGNOSIS — Z923 Personal history of irradiation: Secondary | ICD-10-CM

## 2017-12-23 DIAGNOSIS — D638 Anemia in other chronic diseases classified elsewhere: Secondary | ICD-10-CM | POA: Diagnosis not present

## 2017-12-23 DIAGNOSIS — C61 Malignant neoplasm of prostate: Secondary | ICD-10-CM | POA: Diagnosis present

## 2017-12-23 DIAGNOSIS — I428 Other cardiomyopathies: Secondary | ICD-10-CM | POA: Diagnosis not present

## 2017-12-23 DIAGNOSIS — K219 Gastro-esophageal reflux disease without esophagitis: Secondary | ICD-10-CM | POA: Diagnosis not present

## 2017-12-23 DIAGNOSIS — L8990 Pressure ulcer of unspecified site, unspecified stage: Secondary | ICD-10-CM

## 2017-12-23 DIAGNOSIS — Z96653 Presence of artificial knee joint, bilateral: Secondary | ICD-10-CM | POA: Diagnosis not present

## 2017-12-23 DIAGNOSIS — Z96652 Presence of left artificial knee joint: Secondary | ICD-10-CM | POA: Diagnosis present

## 2017-12-23 DIAGNOSIS — J690 Pneumonitis due to inhalation of food and vomit: Secondary | ICD-10-CM | POA: Diagnosis not present

## 2017-12-23 DIAGNOSIS — I69311 Memory deficit following cerebral infarction: Secondary | ICD-10-CM | POA: Diagnosis not present

## 2017-12-23 DIAGNOSIS — N183 Chronic kidney disease, stage 3 (moderate): Secondary | ICD-10-CM | POA: Diagnosis not present

## 2017-12-23 DIAGNOSIS — R739 Hyperglycemia, unspecified: Secondary | ICD-10-CM | POA: Diagnosis present

## 2017-12-23 DIAGNOSIS — R32 Unspecified urinary incontinence: Secondary | ICD-10-CM | POA: Diagnosis not present

## 2017-12-23 DIAGNOSIS — I480 Paroxysmal atrial fibrillation: Secondary | ICD-10-CM | POA: Diagnosis not present

## 2017-12-23 DIAGNOSIS — Z96651 Presence of right artificial knee joint: Secondary | ICD-10-CM | POA: Diagnosis present

## 2017-12-23 DIAGNOSIS — I5021 Acute systolic (congestive) heart failure: Secondary | ICD-10-CM | POA: Diagnosis not present

## 2017-12-23 DIAGNOSIS — Z888 Allergy status to other drugs, medicaments and biological substances status: Secondary | ICD-10-CM

## 2017-12-23 DIAGNOSIS — G9340 Encephalopathy, unspecified: Secondary | ICD-10-CM | POA: Diagnosis not present

## 2017-12-23 DIAGNOSIS — I361 Nonrheumatic tricuspid (valve) insufficiency: Secondary | ICD-10-CM | POA: Diagnosis not present

## 2017-12-23 DIAGNOSIS — I4721 Torsades de pointes: Secondary | ICD-10-CM

## 2017-12-23 DIAGNOSIS — S3991XA Unspecified injury of abdomen, initial encounter: Secondary | ICD-10-CM | POA: Diagnosis not present

## 2017-12-23 DIAGNOSIS — R918 Other nonspecific abnormal finding of lung field: Secondary | ICD-10-CM | POA: Diagnosis not present

## 2017-12-23 DIAGNOSIS — R350 Frequency of micturition: Secondary | ICD-10-CM | POA: Diagnosis not present

## 2017-12-23 DIAGNOSIS — R6520 Severe sepsis without septic shock: Secondary | ICD-10-CM | POA: Diagnosis not present

## 2017-12-23 DIAGNOSIS — Z950 Presence of cardiac pacemaker: Secondary | ICD-10-CM

## 2017-12-23 DIAGNOSIS — I5043 Acute on chronic combined systolic (congestive) and diastolic (congestive) heart failure: Secondary | ICD-10-CM | POA: Diagnosis present

## 2017-12-23 DIAGNOSIS — N179 Acute kidney failure, unspecified: Secondary | ICD-10-CM | POA: Diagnosis present

## 2017-12-23 DIAGNOSIS — N189 Chronic kidney disease, unspecified: Secondary | ICD-10-CM | POA: Diagnosis present

## 2017-12-23 DIAGNOSIS — I69334 Monoplegia of upper limb following cerebral infarction affecting left non-dominant side: Secondary | ICD-10-CM | POA: Diagnosis not present

## 2017-12-23 DIAGNOSIS — S299XXA Unspecified injury of thorax, initial encounter: Secondary | ICD-10-CM | POA: Diagnosis not present

## 2017-12-23 DIAGNOSIS — I429 Cardiomyopathy, unspecified: Secondary | ICD-10-CM | POA: Diagnosis present

## 2017-12-23 DIAGNOSIS — I5032 Chronic diastolic (congestive) heart failure: Secondary | ICD-10-CM | POA: Diagnosis not present

## 2017-12-23 DIAGNOSIS — T50905A Adverse effect of unspecified drugs, medicaments and biological substances, initial encounter: Secondary | ICD-10-CM

## 2017-12-23 DIAGNOSIS — R600 Localized edema: Secondary | ICD-10-CM | POA: Diagnosis not present

## 2017-12-23 DIAGNOSIS — L89151 Pressure ulcer of sacral region, stage 1: Secondary | ICD-10-CM | POA: Diagnosis not present

## 2017-12-23 DIAGNOSIS — Z91048 Other nonmedicinal substance allergy status: Secondary | ICD-10-CM | POA: Diagnosis not present

## 2017-12-23 DIAGNOSIS — A4151 Sepsis due to Escherichia coli [E. coli]: Secondary | ICD-10-CM | POA: Diagnosis not present

## 2017-12-23 DIAGNOSIS — I442 Atrioventricular block, complete: Secondary | ICD-10-CM | POA: Diagnosis not present

## 2017-12-23 DIAGNOSIS — M159 Polyosteoarthritis, unspecified: Secondary | ICD-10-CM | POA: Diagnosis not present

## 2017-12-23 DIAGNOSIS — N281 Cyst of kidney, acquired: Secondary | ICD-10-CM | POA: Diagnosis not present

## 2017-12-23 DIAGNOSIS — J9601 Acute respiratory failure with hypoxia: Secondary | ICD-10-CM

## 2017-12-23 DIAGNOSIS — I4581 Long QT syndrome: Secondary | ICD-10-CM | POA: Diagnosis not present

## 2017-12-23 DIAGNOSIS — R030 Elevated blood-pressure reading, without diagnosis of hypertension: Secondary | ICD-10-CM | POA: Diagnosis not present

## 2017-12-23 DIAGNOSIS — M549 Dorsalgia, unspecified: Secondary | ICD-10-CM | POA: Diagnosis not present

## 2017-12-23 DIAGNOSIS — E785 Hyperlipidemia, unspecified: Secondary | ICD-10-CM | POA: Diagnosis present

## 2017-12-23 DIAGNOSIS — E8770 Fluid overload, unspecified: Secondary | ICD-10-CM

## 2017-12-23 DIAGNOSIS — M109 Gout, unspecified: Secondary | ICD-10-CM | POA: Diagnosis not present

## 2017-12-23 DIAGNOSIS — R6521 Severe sepsis with septic shock: Secondary | ICD-10-CM | POA: Diagnosis present

## 2017-12-23 DIAGNOSIS — R509 Fever, unspecified: Secondary | ICD-10-CM | POA: Diagnosis not present

## 2017-12-23 DIAGNOSIS — N3090 Cystitis, unspecified without hematuria: Secondary | ICD-10-CM | POA: Diagnosis present

## 2017-12-23 DIAGNOSIS — R9431 Abnormal electrocardiogram [ECG] [EKG]: Secondary | ICD-10-CM

## 2017-12-23 DIAGNOSIS — Z8673 Personal history of transient ischemic attack (TIA), and cerebral infarction without residual deficits: Secondary | ICD-10-CM | POA: Diagnosis not present

## 2017-12-23 DIAGNOSIS — Z79899 Other long term (current) drug therapy: Secondary | ICD-10-CM

## 2017-12-23 DIAGNOSIS — M545 Low back pain: Secondary | ICD-10-CM | POA: Diagnosis not present

## 2017-12-23 LAB — BLOOD GAS, ARTERIAL
Acid-base deficit: 5.5 mmol/L — ABNORMAL HIGH (ref 0.0–2.0)
BICARBONATE: 17.9 mmol/L — AB (ref 20.0–28.0)
Drawn by: 517021
O2 CONTENT: 7 L/min
O2 SAT: 94.6 %
PATIENT TEMPERATURE: 98.6
PCO2 ART: 26.8 mmHg — AB (ref 32.0–48.0)
PO2 ART: 73.5 mmHg — AB (ref 83.0–108.0)
pH, Arterial: 7.44 (ref 7.350–7.450)

## 2017-12-23 LAB — I-STAT ARTERIAL BLOOD GAS, ED
Acid-base deficit: 3 mmol/L — ABNORMAL HIGH (ref 0.0–2.0)
Bicarbonate: 20 mmol/L (ref 20.0–28.0)
O2 Saturation: 98 %
Patient temperature: 98.6
TCO2: 21 mmol/L — ABNORMAL LOW (ref 22–32)
pCO2 arterial: 29.4 mmHg — ABNORMAL LOW (ref 32.0–48.0)
pH, Arterial: 7.44 (ref 7.350–7.450)
pO2, Arterial: 104 mmHg (ref 83.0–108.0)

## 2017-12-23 LAB — CBC WITH DIFFERENTIAL/PLATELET
Basophils Absolute: 0 10*3/uL (ref 0.0–0.1)
Basophils Relative: 1 %
EOS PCT: 0 %
Eosinophils Absolute: 0 10*3/uL (ref 0.0–0.7)
HCT: 39.6 % (ref 39.0–52.0)
HEMOGLOBIN: 12.1 g/dL — AB (ref 13.0–17.0)
LYMPHS ABS: 0.2 10*3/uL — AB (ref 0.7–4.0)
LYMPHS PCT: 4 %
MCH: 27.9 pg (ref 26.0–34.0)
MCHC: 30.6 g/dL (ref 30.0–36.0)
MCV: 91.5 fL (ref 78.0–100.0)
MONOS PCT: 1 %
Monocytes Absolute: 0 10*3/uL — ABNORMAL LOW (ref 0.1–1.0)
Neutro Abs: 4.1 10*3/uL (ref 1.7–7.7)
Neutrophils Relative %: 94 %
PLATELETS: 235 10*3/uL (ref 150–400)
RBC: 4.33 MIL/uL (ref 4.22–5.81)
RDW: 15.9 % — ABNORMAL HIGH (ref 11.5–15.5)
WBC: 4.3 10*3/uL (ref 4.0–10.5)

## 2017-12-23 LAB — LACTIC ACID, PLASMA
Lactic Acid, Venous: 2.6 mmol/L (ref 0.5–1.9)
Lactic Acid, Venous: 5.6 mmol/L (ref 0.5–1.9)

## 2017-12-23 LAB — CBC
HEMATOCRIT: 39 % (ref 39.0–52.0)
HEMOGLOBIN: 11.5 g/dL — AB (ref 13.0–17.0)
MCH: 27.8 pg (ref 26.0–34.0)
MCHC: 29.5 g/dL — ABNORMAL LOW (ref 30.0–36.0)
MCV: 94.4 fL (ref 78.0–100.0)
Platelets: 217 10*3/uL (ref 150–400)
RBC: 4.13 MIL/uL — AB (ref 4.22–5.81)
RDW: 16.2 % — ABNORMAL HIGH (ref 11.5–15.5)
WBC: 33.5 10*3/uL — AB (ref 4.0–10.5)

## 2017-12-23 LAB — MAGNESIUM: Magnesium: 2 mg/dL (ref 1.7–2.4)

## 2017-12-23 LAB — URINALYSIS, ROUTINE W REFLEX MICROSCOPIC

## 2017-12-23 LAB — BLOOD CULTURE ID PANEL (REFLEXED)

## 2017-12-23 LAB — COMPREHENSIVE METABOLIC PANEL
ALBUMIN: 3.1 g/dL — AB (ref 3.5–5.0)
ALT: 28 U/L (ref 0–44)
ANION GAP: 11 (ref 5–15)
AST: 31 U/L (ref 15–41)
Alkaline Phosphatase: 85 U/L (ref 38–126)
BUN: 26 mg/dL — AB (ref 8–23)
CHLORIDE: 109 mmol/L (ref 98–111)
CO2: 22 mmol/L (ref 22–32)
Calcium: 9.4 mg/dL (ref 8.9–10.3)
Creatinine, Ser: 1.37 mg/dL — ABNORMAL HIGH (ref 0.61–1.24)
GFR calc Af Amer: 54 mL/min — ABNORMAL LOW (ref >60)
GFR calc non Af Amer: 46 mL/min — ABNORMAL LOW (ref >60)
GLUCOSE: 127 mg/dL — AB (ref 70–99)
POTASSIUM: 3.7 mmol/L (ref 3.5–5.1)
Sodium: 142 mmol/L (ref 135–145)
Total Bilirubin: 0.8 mg/dL (ref 0.3–1.2)
Total Protein: 6 g/dL — ABNORMAL LOW (ref 6.5–8.1)

## 2017-12-23 LAB — URINALYSIS, MICROSCOPIC (REFLEX)

## 2017-12-23 LAB — I-STAT CHEM 8, ED
BUN: 26 mg/dL — ABNORMAL HIGH (ref 8–23)
Calcium, Ion: 1.2 mmol/L (ref 1.15–1.40)
Chloride: 107 mmol/L (ref 98–111)
Creatinine, Ser: 1.1 mg/dL (ref 0.61–1.24)
Glucose, Bld: 124 mg/dL — ABNORMAL HIGH (ref 70–99)
HEMATOCRIT: 36 % — AB (ref 39.0–52.0)
Hemoglobin: 12.2 g/dL — ABNORMAL LOW (ref 13.0–17.0)
POTASSIUM: 3.7 mmol/L (ref 3.5–5.1)
SODIUM: 142 mmol/L (ref 135–145)
TCO2: 22 mmol/L (ref 22–32)

## 2017-12-23 LAB — BASIC METABOLIC PANEL
ANION GAP: 15 (ref 5–15)
BUN: 28 mg/dL — ABNORMAL HIGH (ref 8–23)
CHLORIDE: 112 mmol/L — AB (ref 98–111)
CO2: 16 mmol/L — ABNORMAL LOW (ref 22–32)
Calcium: 8.9 mg/dL (ref 8.9–10.3)
Creatinine, Ser: 1.66 mg/dL — ABNORMAL HIGH (ref 0.61–1.24)
GFR calc non Af Amer: 37 mL/min — ABNORMAL LOW (ref >60)
GFR, EST AFRICAN AMERICAN: 43 mL/min — AB (ref >60)
GLUCOSE: 200 mg/dL — AB (ref 70–99)
Potassium: 4.5 mmol/L (ref 3.5–5.1)
SODIUM: 143 mmol/L (ref 135–145)

## 2017-12-23 LAB — I-STAT CG4 LACTIC ACID, ED
LACTIC ACID, VENOUS: 4.2 mmol/L — AB (ref 0.5–1.9)
Lactic Acid, Venous: 2.32 mmol/L (ref 0.5–1.9)

## 2017-12-23 LAB — GLUCOSE, CAPILLARY: Glucose-Capillary: 179 mg/dL — ABNORMAL HIGH (ref 70–99)

## 2017-12-23 LAB — TROPONIN I: Troponin I: 1.55 ng/mL (ref <0.03)

## 2017-12-23 LAB — PHOSPHORUS: PHOSPHORUS: 4.1 mg/dL (ref 2.5–4.6)

## 2017-12-23 MED ORDER — PIPERACILLIN-TAZOBACTAM 3.375 G IVPB 30 MIN
3.3750 g | Freq: Once | INTRAVENOUS | Status: AC
  Administered 2017-12-23: 3.375 g via INTRAVENOUS
  Filled 2017-12-23: qty 50

## 2017-12-23 MED ORDER — SODIUM CHLORIDE 0.9 % IV SOLN
2.0000 g | INTRAVENOUS | Status: DC
  Administered 2017-12-24 – 2017-12-25 (×2): 2 g via INTRAVENOUS
  Filled 2017-12-23 (×2): qty 20

## 2017-12-23 MED ORDER — FOLIC ACID 1 MG PO TABS
1.0000 mg | ORAL_TABLET | Freq: Every day | ORAL | Status: DC
  Administered 2017-12-23 – 2017-12-29 (×7): 1 mg via ORAL
  Filled 2017-12-23 (×7): qty 1

## 2017-12-23 MED ORDER — ATORVASTATIN CALCIUM 20 MG PO TABS
20.0000 mg | ORAL_TABLET | Freq: Every day | ORAL | Status: DC
  Administered 2017-12-23 – 2017-12-30 (×7): 20 mg via ORAL
  Filled 2017-12-23 (×8): qty 1

## 2017-12-23 MED ORDER — METOPROLOL SUCCINATE ER 25 MG PO TB24
25.0000 mg | ORAL_TABLET | Freq: Every day | ORAL | Status: DC
  Administered 2017-12-24 – 2017-12-29 (×6): 25 mg via ORAL
  Filled 2017-12-23 (×6): qty 1

## 2017-12-23 MED ORDER — HYDROCORTISONE NA SUCCINATE PF 100 MG IJ SOLR
50.0000 mg | Freq: Four times a day (QID) | INTRAMUSCULAR | Status: DC
  Administered 2017-12-23 (×3): 50 mg via INTRAVENOUS
  Filled 2017-12-23 (×3): qty 2

## 2017-12-23 MED ORDER — SODIUM CHLORIDE 0.9 % IV BOLUS
1000.0000 mL | Freq: Once | INTRAVENOUS | Status: AC
Start: 2017-12-23 — End: 2017-12-23
  Administered 2017-12-23: 1000 mL via INTRAVENOUS

## 2017-12-23 MED ORDER — VANCOMYCIN HCL IN DEXTROSE 1-5 GM/200ML-% IV SOLN: 1000.0000 mg | Freq: Once | INTRAVENOUS | Status: DC

## 2017-12-23 MED ORDER — DULOXETINE HCL 30 MG PO CPEP
30.0000 mg | ORAL_CAPSULE | Freq: Every day | ORAL | Status: DC
  Administered 2017-12-23 – 2017-12-29 (×7): 30 mg via ORAL
  Filled 2017-12-23 (×8): qty 1

## 2017-12-23 MED ORDER — ORAL CARE MOUTH RINSE
15.0000 mL | Freq: Two times a day (BID) | OROMUCOSAL | Status: DC
  Administered 2017-12-24 – 2017-12-27 (×7): 15 mL via OROMUCOSAL

## 2017-12-23 MED ORDER — SODIUM CHLORIDE 0.9 % IV SOLN
250.0000 mL | INTRAVENOUS | Status: DC | PRN
  Administered 2017-12-28: 20 mL via INTRAVENOUS

## 2017-12-23 MED ORDER — OXYCODONE-ACETAMINOPHEN 5-325 MG PO TABS
0.5000 | ORAL_TABLET | Freq: Two times a day (BID) | ORAL | Status: DC | PRN
Start: 2017-12-23 — End: 2017-12-30
  Administered 2017-12-24 – 2017-12-25 (×3): 1 via ORAL
  Administered 2017-12-25: 0.5 via ORAL
  Administered 2017-12-26 – 2017-12-30 (×4): 1 via ORAL
  Filled 2017-12-23 (×9): qty 1

## 2017-12-23 MED ORDER — FUROSEMIDE 10 MG/ML IJ SOLN
20.0000 mg | Freq: Once | INTRAMUSCULAR | Status: AC
  Administered 2017-12-23: 20 mg via INTRAVENOUS
  Filled 2017-12-23: qty 2

## 2017-12-23 MED ORDER — VANCOMYCIN HCL 10 G IV SOLR
2000.0000 mg | Freq: Once | INTRAVENOUS | Status: AC
  Administered 2017-12-23: 2000 mg via INTRAVENOUS
  Filled 2017-12-23: qty 2000

## 2017-12-23 MED ORDER — ACETAMINOPHEN 325 MG PO TABS
650.0000 mg | ORAL_TABLET | Freq: Four times a day (QID) | ORAL | Status: DC | PRN
  Administered 2017-12-24 – 2017-12-27 (×7): 650 mg via ORAL
  Filled 2017-12-23 (×8): qty 2

## 2017-12-23 MED ORDER — SODIUM CHLORIDE 0.9 % IV BOLUS
1500.0000 mL | Freq: Once | INTRAVENOUS | Status: AC
  Administered 2017-12-23: 1500 mL via INTRAVENOUS

## 2017-12-23 MED ORDER — DONEPEZIL HCL 10 MG PO TABS
10.0000 mg | ORAL_TABLET | Freq: Every day | ORAL | Status: DC
  Administered 2017-12-24 – 2017-12-29 (×7): 10 mg via ORAL
  Filled 2017-12-23 (×8): qty 1

## 2017-12-23 MED ORDER — PIPERACILLIN-TAZOBACTAM 3.375 G IVPB
3.3750 g | Freq: Three times a day (TID) | INTRAVENOUS | Status: DC
  Administered 2017-12-23: 3.375 g via INTRAVENOUS
  Filled 2017-12-23 (×3): qty 50

## 2017-12-23 MED ORDER — ACETAMINOPHEN 650 MG RE SUPP: 650.0000 mg | Freq: Four times a day (QID) | RECTAL | Status: DC | PRN

## 2017-12-23 MED ORDER — ACETAMINOPHEN 650 MG RE SUPP
650.0000 mg | Freq: Once | RECTAL | Status: AC
  Administered 2017-12-23: 650 mg via RECTAL
  Filled 2017-12-23: qty 1

## 2017-12-23 MED ORDER — FUROSEMIDE 10 MG/ML IJ SOLN
60.0000 mg | Freq: Once | INTRAMUSCULAR | Status: AC
  Administered 2017-12-23: 60 mg via INTRAVENOUS

## 2017-12-23 MED ORDER — DOFETILIDE 500 MCG PO CAPS
500.0000 ug | ORAL_CAPSULE | Freq: Once | ORAL | Status: AC
  Administered 2017-12-23: 500 ug via ORAL
  Filled 2017-12-23: qty 1

## 2017-12-23 MED ORDER — SODIUM CHLORIDE 0.9 % IV BOLUS: 2000.0000 mL | Freq: Once | INTRAVENOUS | Status: DC

## 2017-12-23 MED ORDER — SODIUM CHLORIDE 0.9 % IV SOLN
INTRAVENOUS | Status: DC
  Administered 2017-12-23 (×2): via INTRAVENOUS

## 2017-12-23 MED ORDER — VANCOMYCIN HCL IN DEXTROSE 750-5 MG/150ML-% IV SOLN
750.0000 mg | Freq: Two times a day (BID) | INTRAVENOUS | Status: DC
  Filled 2017-12-23: qty 150

## 2017-12-23 MED ORDER — PANTOPRAZOLE SODIUM 40 MG PO TBEC
40.0000 mg | DELAYED_RELEASE_TABLET | Freq: Every day | ORAL | Status: DC
  Administered 2017-12-23 – 2017-12-29 (×7): 40 mg via ORAL
  Filled 2017-12-23 (×7): qty 1

## 2017-12-23 MED ORDER — DOFETILIDE 500 MCG PO CAPS: 500.0000 ug | ORAL_CAPSULE | Freq: Two times a day (BID) | ORAL | Status: DC

## 2017-12-23 MED ORDER — APIXABAN 5 MG PO TABS
5.0000 mg | ORAL_TABLET | Freq: Two times a day (BID) | ORAL | Status: DC
  Administered 2017-12-23 – 2017-12-24 (×2): 5 mg via ORAL
  Filled 2017-12-23 (×2): qty 1

## 2017-12-23 MED ORDER — OXYCODONE-ACETAMINOPHEN 10-325 MG PO TABS: 0.2500 | ORAL_TABLET | Freq: Two times a day (BID) | ORAL | Status: DC | PRN

## 2017-12-23 MED ORDER — FERROUS SULFATE 325 (65 FE) MG PO TABS
325.0000 mg | ORAL_TABLET | Freq: Two times a day (BID) | ORAL | Status: DC
  Administered 2017-12-23 – 2017-12-30 (×13): 325 mg via ORAL
  Filled 2017-12-23 (×14): qty 1

## 2017-12-23 NOTE — Progress Notes (Addendum)
RN at pt's bedside at 2110. [name] with pt at bedside. Pt had eaten an [place] ice. Pt talking to nurse and wife. Pt then became unresponsive, eye rolled back in head and HR went into the 200's. [name] left room crying stating she had to get out of there. Called code blue at 2111. Gave 1 shock during code blue. Code blue event ended at 2130. Pt placed on nonrebreather mask. Gave lasix 60mg  IV. Notified Bodenhemir, NP of code blue. Dr. Maudie Mercury came to bedside.  Pt is talking and answering questions. Report called to [place]. Pt transferred to [place]. Pt's family getting pt's belongings. Ranelle Oyster, RN

## 2017-12-23 NOTE — ED Provider Notes (Signed)
[facility] EMERGENCY DEPARTMENT Provider Note   CSN: [id number] Arrival date & time: 12/23/17  0757     History   Chief Complaint Chief Complaint  Patient presents with  . Urinary Tract Infection    HPI Christopher Baker is a 84 y.o. male.  84 year old with history of atrial fibrillation on Eliquis, history of prostate cancer status post radiation, chronic systolic CHF with ejection fraction 20-25%, psoriatic arthritis, history of CVA with vascular dementia, recently admitted in May for e. Coli sepsis related to UTI presents with altered mental status.  Per EMS, he was leaking urine on the bed this morning and had some bloody urine.  He has been shaking.  History is limited due to his dementia.     Past Medical History:  Diagnosis Date  . Anxiety   . Arthritis    "hands, right hip; lower back" (02/28/2017)  . Arthritis with psoriasis ([place])   . Borderline diabetes    DIET CONTROLLED - PT STATES HE IS NOT DIABETIC  . Carotid stenosis, bilateral PER DR EARLY / DUPLEX  09-09-10     40 - 59%   BILATERALLY--- ASYMPTOMATIC  . Chronic lower back pain   . Depression   . Dysrhythmia    a-fib  . GERD (gastroesophageal reflux disease)    CONTROLLED W/ PROTONIX  . History of gout   . HTN (hypertension)   . Hyperlipidemia   . Iron deficiency   . Lumbar spondylosis W/ RADICULOPATHY  . OSA on CPAP   . PAF (paroxysmal atrial fibrillation) ([place])   . Presence of permanent cardiac pacemaker DDD-  06-04-10-   DDD; SECONDARY TO SYNCOPY AND BRADYCARDIA  . Prostate cancer ([place]) 2008   S/P 30 RADIATION  [place]    . Restless leg syndrome   . Stroke [facility]) Oct. 14, 2014   light left hand, balance issues, short term memory loss 2014  . SVT (supraventricular tachycardia) ([place])    S/P ABLATION   2008    Patient Active Problem List   Diagnosis Date Noted  . Sepsis, unspecified organism ([place]) 10/30/2017  . Acute urinary retention 10/30/2017  . Chronic systolic heart failure  ([place]) 09/07/2017  . S/P shoulder replacement 06/27/2015  . Residual cognitive deficit as late effect of stroke 04/15/2015  . Lung nodule   . Weakness generalized 12/11/2014  . Leukocytosis 12/11/2014  . Restless leg syndrome 12/11/2014  . Normocytic anemia 12/11/2014  . Abnormal EKG 12/11/2014  . Gait disturbance, post-stroke 10/22/2014  . Partial seizures ([place]) 06/18/2014  . Vitamin B12 deficiency 06/18/2014  . OSA (obstructive sleep apnea) 02/26/2014  . Cognitive deficits as late effect of cerebrovascular disease 01/11/2014  . Fatigue 09/20/2013  . Intermittent lightheadedness 06/19/2013  . Low blood pressure, not hypotension 06/19/2013  . Vascular dementia 04/09/2013  . History of completed stroke 04/03/2013  . Occlusion and stenosis of carotid artery without mention of cerebral infarction 11/21/2012  . OA (osteoarthritis) of knee 12/20/2011  . Methotrexate, long term, current use 09/10/2011  . Knee pain, left 07/27/2011  . Pacemaker-Medtronic 09/08/2010  . UNSPECIFIED VISUAL DISTURBANCE 07/20/2010  . IRON DEFIC ANEMIA [place] DIET IRON INTAKE 04/20/2010  . PSORIASIS 04/20/2010  . DISUSE OSTEOPOROSIS 05/12/2009  . SPONDYLOSIS, LUMBAR, WITH RADICULOPATHY 10/15/2008  . OTHER SPECIFIED DERMATOMYCOSES 06/24/2008  . VARICOSE VEINS LOWER EXTREMITIES W/INFLAMMATION 12/25/2007  . INSOMNIA WITH SLEEP APNEA UNSPECIFIED 08/31/2007  . GERD 03/23/2007  . Atrial fibrillation, chronic ([place]) 01/31/2007  . PSORIATIC ARTHRITIS 01/31/2007  . Hyperlipidemia  01/19/2007  . Essential hypertension 01/19/2007  . PROSTATE CANCER, HX OF 01/19/2007    Past Surgical History:  Procedure Laterality Date  . BACK SURGERY    . BIV UPGRADE N/A 09/07/2017   Procedure: BIV PACEMAKER UPGRADE;  Surgeon: Evans Lance, MD;  Location: [facility] CV LAB;  Service: Cardiovascular;  Laterality: N/A;  . BREAST SURGERY     "removed tumor; don't remember which side; years ago" (02/28/2017)  . CARDIAC  ELECTROPHYSIOLOGY STUDY AND ABLATION  2008   FOR SVT  . CARDIAC PACEMAKER PLACEMENT  06-04-10   DDD  . CARDIOVERSION N/A 03/02/2017   Procedure: CARDIOVERSION;  Surgeon: Josue Hector, MD;  Location: [name] ENDOSCOPY;  Service: Cardiovascular;  Laterality: N/A;  . CATARACT EXTRACTION W/ INTRAOCULAR LENS  IMPLANT, BILATERAL Bilateral   . CHOLECYSTECTOMY  2009  . ESOPHAGOGASTRODUODENOSCOPY N/A 12/12/2014   Procedure: ESOPHAGOGASTRODUODENOSCOPY (EGD);  Surgeon: Wilford Corner, MD;  Location: [facility] ENDOSCOPY;  Service: Endoscopy;  Laterality: N/A;  . INCISION AND DRAINAGE Right 1997   "knee replacement got infected"  . INGUINAL HERNIA REPAIR Left 06/2003    DONE WITH PENILE PROSTESIS SURG.  . INGUINAL HERNIA REPAIR Right 1962  . JOINT REPLACEMENT    . KNEE ARTHROSCOPY  06/02/2011   Procedure: ARTHROSCOPY KNEE;  Surgeon: Gearlean Alf;  Location: [facility];  Service: Orthopedics;  Laterality: Left;  LEFT KNEE ARTHROSCOPY WITH DEBRIDEMENT  . LOOP RECORDER PLACEMENT  01-30-10  . LOOP RECORDER REMOVAL  06/04/2010  . LUMBAR LAMINECTOMY/ DISKECTOMY/ FUSION  05-19-10   L4 - 5  . PENILE PROSTHESIS IMPLANT  06/2003   AND LEFT CORPOROPLASTY /    DONE INGUINAL REPAIR  . PROSTATE BIOPSY  2007  . REVISION TOTAL KNEE ARTHROPLASTY Right 1997  . TOTAL KNEE ARTHROPLASTY  12/20/2011   Procedure: TOTAL KNEE ARTHROPLASTY;  Surgeon: Gearlean Alf, MD;  Location: [facility] ORS;  Service: Orthopedics;  Laterality: Left;  . TOTAL KNEE ARTHROPLASTY Right 1997  . TOTAL SHOULDER ARTHROPLASTY Right 06/27/2015   Procedure: REVERSE TOTAL SHOULDER ARTHROPLASTY;  Surgeon: Netta Cedars, MD;  Location: [facility];  Service: Orthopedics;  Laterality: Right;  . TRANSURETHRAL RESECTION OF BLADDER TUMOR WITH GYRUS (TURBT-GYRUS)  ?2018  . TRANSURETHRAL RESECTION OF PROSTATE  2006        Home Medications    Prior to Admission medications   Medication Sig Start Date End Date Taking? Authorizing Provider  acetaminophen  (TYLENOL) 500 MG tablet Take 1,000 mg by mouth 2 (two) times daily.   Yes [provider]  amoxicillin (AMOXIL) 500 MG capsule Take 2,000 mg by mouth See admin instructions. Take 2000 mg by mouth one hour prior to dental apointments 09/02/16  Yes [provider]  Artificial Tear Ointment (LUBRICANT EYE OP) Place 2 drops into both eyes daily as needed (for dry eyes).   Yes [provider]  aspirin 325 MG tablet Take 325 mg by mouth once.   Yes [provider]  atorvastatin (LIPITOR) 20 MG tablet Take 20 mg by mouth daily.   Yes [provider]  cyanocobalamin (,VITAMIN B-12,) 1000 MCG/ML injection Inject 1,000 mcg into the muscle every 30 (thirty) days.    Yes [provider]  dofetilide (TIKOSYN) 500 MCG capsule Take 1 capsule (500 mcg total) by mouth 2 (two) times daily. Patient taking differently: Take 500 mcg by mouth every 12 (twelve) hours. Takes between 10:00 and 11:00 in the AM & PM 10/27/17  Yes Evans Lance, MD  donepezil (  ARICEPT) 10 MG tablet TAKE ONE TABLET BY MOUTH EVERY NIGHT AT BEDTIME 10/27/17  Yes Jaffe, Adam R, DO  DULoxetine (CYMBALTA) 30 MG capsule Take 1 capsule (30 mg total) by mouth daily. Patient taking differently: Take 30 mg by mouth at bedtime.  03/04/17  Yes Baldwin Jamaica, PA-C  ELIQUIS 5 MG TABS tablet TAKE 1 TABLET (5 MG TOTAL) BY MOUTH 2 (TWO) TIMES DAILY. 06/27/17  Yes Evans Lance, MD  ezetimibe (ZETIA) 10 MG tablet Take 1 tablet (10 mg total) by mouth daily. 03/28/15  Yes Dutch Quint B, FNP  ferrous sulfate 325 (65 FE) MG tablet Take 325 mg by mouth 2 (two) times daily with a meal.    Yes [provider]  folic acid (FOLVITE) 1 MG tablet Take 1 mg by mouth at bedtime.    Yes [provider]  furosemide (LASIX) 40 MG tablet Take 80 mg by mouth daily.  02/11/17  Yes [provider]  lisinopril (PRINIVIL,ZESTRIL) 5 MG tablet TAKE ONE TABLET BY MOUTH DAILY Patient taking differently: TAKE  ONE TABLET BY MOUTH in the evening 11/28/17  Yes Evans Lance, MD  loperamide (IMODIUM A-D) 2 MG tablet Take 2 mg by mouth 3 (three) times daily as needed for diarrhea or loose stools.    Yes [provider]  methotrexate (RHEUMATREX) 2.5 MG tablet Take 15 mg by mouth every Monday.  10/02/11  Yes [provider]  metoprolol succinate (TOPROL XL) 25 MG 24 hr tablet Take 1 tablet (25 mg total) by mouth daily. Patient taking differently: Take 25 mg by mouth at bedtime.  06/20/17  Yes Evans Lance, MD  oxyCODONE-acetaminophen (PERCOCET) 10-325 MG tablet Take 0.25-0.5 tablets by mouth 2 (two) times daily as needed for pain.  07/28/17  Yes [provider]  pantoprazole (PROTONIX) 40 MG tablet Take 1 tablet (40 mg total) by mouth daily. Patient taking differently: Take 40 mg by mouth at bedtime.  10/22/15  Yes Dutch Quint B, FNP  predniSONE (DELTASONE) 5 MG tablet Take 5 mg by mouth daily.   Yes [provider]  tamsulosin (FLOMAX) 0.4 MG CAPS capsule Take 0.4 mg by mouth daily. 10/18/17  Yes [provider]  Vitamin D, Ergocalciferol, (DRISDOL) 50000 units CAPS capsule Take 50,000 Units by mouth every Sunday.    Yes [provider]  DULoxetine (CYMBALTA) 30 MG capsule Take 1 capsule (30 mg total) by mouth daily. Patient not taking: Reported on 12/23/2017 12/14/17   Pieter Partridge, DO    Family History Family History  Problem Relation Age of Onset  . Stroke Mother   . Early death Father     Social History Social History   Tobacco Use  . Smoking status: Former Smoker    Packs/day: 1.00    Years: 45.00    Pack years: 45.00    Types: Cigarettes    Last attempt to quit: 12/07/1998    Years since quitting: 19.0  . Smokeless tobacco: Never Used  Substance Use Topics  . Alcohol use: Yes    Comment: 1 cocktail per evening  . Drug use: No     Allergies   Ciprofloxacin; Hydrocodone-acetaminophen; Other; Shellfish allergy; and  Tramadol   Review of Systems Review of Systems  Unable to perform ROS: Mental status change     Physical Exam Updated Vital Signs BP (!) 106/51   Pulse (!) 133   Temp (!) 104 F (40 C) (Rectal)   Resp (!) 25  Ht _0  (1.905 m)   Wt 98.9 kg (218 lb)   SpO2 96%   BMI 27.25 kg/m   Physical Exam  Constitutional: He appears well-developed and well-nourished. He appears distressed.  Patient is shivering  HENT:  Head: Normocephalic and atraumatic.  Eyes: Pupils are equal, round, and reactive to light.  Neck: Normal range of motion. Neck supple.  Cardiovascular: Normal rate, regular rhythm and normal heart sounds.  Pulmonary/Chest: Effort normal and breath sounds normal. No respiratory distress. He has no wheezes. He has no rales. He exhibits no tenderness.  Abdominal: Soft. Bowel sounds are normal. There is no tenderness. There is no rebound and no guarding.  Genitourinary:  Genitourinary Comments: He has an enlarged scrotum but no tenderness on palpation, no erythema or wounds noted  Musculoskeletal: Normal range of motion. He exhibits edema.  +3+ edema to the lower extremities bilaterally, there is some overlying erythema but no warmth, no wounds  Lymphadenopathy:    He has no cervical adenopathy.  Neurological: He is alert.  Patient is awake with eyes open but is nonverbal, is not following commands  Skin: Skin is warm and dry. No rash noted.  Psychiatric: He has a normal mood and affect.     ED Treatments / Results  Labs (all labs ordered are listed, but only abnormal results are displayed) Labs Reviewed  COMPREHENSIVE METABOLIC PANEL - Abnormal; Notable for the following components:      Result Value   Glucose, Bld 127 (*)    BUN 26 (*)    Creatinine, Ser 1.37 (*)    Total Protein 6.0 (*)    Albumin 3.1 (*)    GFR calc non Af Amer 46 (*)    GFR calc Af Amer 54 (*)    All other components within normal limits  CBC WITH DIFFERENTIAL/PLATELET - Abnormal;  Notable for the following components:   Hemoglobin 12.1 (*)    RDW 15.9 (*)    All other components within normal limits  URINALYSIS, ROUTINE W REFLEX MICROSCOPIC - Abnormal; Notable for the following components:   Color, Urine RED (*)    APPearance CLOUDY (*)    Glucose, UA   (*)    Value: TEST NOT REPORTED DUE TO COLOR INTERFERENCE OF URINE PIGMENT   Hgb urine dipstick   (*)    Value: TEST NOT REPORTED DUE TO COLOR INTERFERENCE OF URINE PIGMENT   Bilirubin Urine   (*)    Value: TEST NOT REPORTED DUE TO COLOR INTERFERENCE OF URINE PIGMENT   Ketones, ur   (*)    Value: TEST NOT REPORTED DUE TO COLOR INTERFERENCE OF URINE PIGMENT   Protein, ur   (*)    Value: TEST NOT REPORTED DUE TO COLOR INTERFERENCE OF URINE PIGMENT   Nitrite   (*)    Value: TEST NOT REPORTED DUE TO COLOR INTERFERENCE OF URINE PIGMENT   Leukocytes, UA   (*)    Value: TEST NOT REPORTED DUE TO COLOR INTERFERENCE OF URINE PIGMENT   All other components within normal limits  URINALYSIS, MICROSCOPIC (REFLEX) - Abnormal; Notable for the following components:   Bacteria, UA MANY (*)    All other components within normal limits  I-STAT CG4 LACTIC ACID, ED - Abnormal; Notable for the following components:   Lactic Acid, Venous 4.20 (*)    All other components within normal limits  I-STAT CHEM 8, ED - Abnormal; Notable for the following components:   BUN 26 (*)    Glucose,  Bld 124 (*)    Hemoglobin 12.2 (*)    HCT 36.0 (*)    All other components within normal limits  CULTURE, BLOOD (ROUTINE X 2)  CULTURE, BLOOD (ROUTINE X 2)  URINE CULTURE  I-STAT CG4 LACTIC ACID, ED    EKG EKG Interpretation  Date/Time:  Friday December 23 2017 09:47:33 EDT Ventricular Rate:  78 PR Interval:    QRS Duration: 101 QT Interval:  394 QTC Calculation: 449 R Axis:   11 Text Interpretation:  Atrial fibrillation Ventricular premature complex Repol abnrm suggests ischemia, diffuse leads Confirmed by Malvin Johns [phone]) on 12/23/2017  10:08:00 AM   Radiology Dg Chest Port 1 View  Result Date: 12/23/2017 CLINICAL DATA:  Sepsis, UTI symptoms, hematuria, history stroke, pacemaker/SVT, hypertension, former smoker EXAM: PORTABLE CHEST 1 VIEW COMPARISON:  Portable exam 0816 hours compared to 10/29/2017 FINDINGS: LEFT subclavian transvenous pacemaker with leads projecting over RIGHT atrium, RIGHT ventricle, and coronary sinus. Enlargement of cardiac silhouette with pulmonary vascular congestion. Atherosclerotic calcification aorta. Hazy interstitial infiltrate bilaterally likely pulmonary edema. No segmental consolidation, pleural effusion or pneumothorax. Bones demineralized. Reverse RIGHT shoulder arthroplasty noted. IMPRESSION: Enlargement of cardiac silhouette with vascular congestion and probable mild pulmonary edema. Electronically Signed   By: Lavonia Dana M.D.   On: 12/23/2017 08:25    Procedures Procedures (including critical care time)  Medications Ordered in ED Medications  vancomycin (VANCOCIN) 2,000 mg in sodium chloride 0.9 % 500 mL IVPB (2,000 mg Intravenous New Bag/Given 12/23/17 0853)  sodium chloride 0.9 % bolus 1,500 mL (has no administration in time range)  dofetilide (TIKOSYN) capsule 500 mcg (has no administration in time range)  vancomycin (VANCOCIN) IVPB 750 mg/150 ml premix (has no administration in time range)  piperacillin-tazobactam (ZOSYN) IVPB 3.375 g (0 g Intravenous Stopped 12/23/17 0943)  sodium chloride 0.9 % bolus 1,000 mL (0 mLs Intravenous Stopped 12/23/17 1008)  acetaminophen (TYLENOL) suppository 650 mg (650 mg Rectal Given 12/23/17 2585)     Initial Impression / Assessment and Plan / ED Course  I have reviewed the triage vital signs and the nursing notes.  Pertinent labs & imaging results that were available during my care of the patient were reviewed by me and considered in my medical decision making (see chart for details).     Patient is a 84 year old male who presents with altered mental  status and fever.  He met sepsis criteria on arrival and was treated aggressively with IV antibiotics and fluids.  His lactate was greater than 4 and he was ordered a total of 3 L of fluid which is a 30 cc/kg bolus.  However his chest x-ray showed some mild pulmonary edema and this was ordered over 3 hours.  He had received 500 cc of fluid with the vancomycin so an additional 2500 cc was ordered of normal saline.  His urinalysis does appear bloody.  The result would not report out because of the gross hematuria.  Urine culture was sent.  His bladder was irrigated after a Foley was placed.  Chest x-ray shows some pulmonary edema but no evidence of pneumonia.  His white count is normal.  His creatinine is mildly elevated.  His.  In rapid A. fib on arrival with a heart rate of 170.  However Tylenol and IV fluids were initiated and his heart rate improved.  Its currently at a rate in the 80s but does go in and out of atrial fibrillation.  It will periodically go up into the 130s and then  go back to the 80s.  He was given his normal dose of Tikosyn as his wife reports that he normally gets it at 10:00 in the morning.  I spoke with Dr. Cruzita Lederer with hospitalist service who will admit the patient for further treatment.  At this point he does not have any worsening respiratory distress.  I am concerned that he may have worsening pulmonary edema given IV fluids.  He is on nasal cannula now and has mild tachypnea but no increased work of breathing and he is talking in full sentences.  I did have a long discussion with the wife who advises that patient is a full code.  CRITICAL CARE Performed by: Malvin Johns Total critical care time: 60 minutes Critical care time was exclusive of separately billable procedures and treating other patients. Critical care was necessary to treat or prevent imminent or life-threatening deterioration. Critical care was time spent personally by me on the following activities: development of  treatment plan with patient and/or surrogate as well as nursing, discussions with consultants, evaluation of patient's response to treatment, examination of patient, obtaining history from patient or surrogate, ordering and performing treatments and interventions, ordering and review of laboratory studies, ordering and review of radiographic studies, pulse oximetry and re-evaluation of patient's condition.   Final Clinical Impressions(s) / ED Diagnoses   Final diagnoses:  Sepsis, due to unspecified organism [facility])  Gross hematuria  Atrial fibrillation, unspecified type [facility])    ED Discharge Orders    None       Malvin Johns, MD 12/23/17 [phone]

## 2017-12-23 NOTE — Consult Note (Addendum)
Cardiology Consultation:   Patient ID: Christopher Baker; [id number]; 08-14-1933   Admit date: 12/23/2017 Date of Consult: 12/23/2017  Primary Care Provider: Haywood Pao, MD Primary Cardiologist: No primary care provider on file. Primary Electrophysiologist:  Cristopher Peru, MD   Chief Complaint: Cardiac Arrest  Patient Profile:  Christopher Baker is a 84 y.o. male with paroxysmal atrial fibrillation ([place] = 6) on apixiban and dofetilide, mild to moderate aortic stenosis, non-ischemic cardiomyopathy, complete heart block status-post biventricular Medtronic pacemaker implantation, who is being seen today for the evaluation of polymorphic ventricular tachycaria arrest at the request of the pulmonary critical care team.  History of Present Illness:  Christopher Baker is an 83 year old gentlemen with the aforementioned medical history who presented to the [facility] Emergency Department with rigors, hematuria, urinary incontinence similar to his prior episodes of sepsis due to urinary tract infections. He was initially hemodynamically stable and treated with piperacillin-tazobactam, vancomycin in addition to 3 liters of crystalloid in addition to hydrocortisone. He received his home dofetilide at 1051. He subsequently developed atrial fibrillation with rapid ventricular response and worsening respiratory distress for which he received several doses of intravenous furosemide 40 mg this evening. At approximately 21:14:27 he developed polymorphic ventricular tachycardia with loss of palpable pulses. According to his family immediately prior to the arrest he was conversant and had just grabbed a cold ice cream cone. He received chest compressions and subsequently was shocked out of ventricular tachycardia with return of spontaneous circulation within the first round of ACLS. The patient tells me that he is currently feeling dyspneic. He has some chest discomfort that hurts with inspiration. This was not  present prior to his chest compressions.  Past Medical History:  Diagnosis Date  . Anxiety   . Arthritis    "hands, right hip; lower back" (02/28/2017)  . Arthritis with psoriasis ([place])   . Borderline diabetes    DIET CONTROLLED - PT STATES HE IS NOT DIABETIC  . Carotid stenosis, bilateral PER DR EARLY / DUPLEX  09-09-10     40 - 59%   BILATERALLY--- ASYMPTOMATIC  . Chronic lower back pain   . Depression   . Dysrhythmia    a-fib  . GERD (gastroesophageal reflux disease)    CONTROLLED W/ PROTONIX  . History of gout   . HTN (hypertension)   . Hyperlipidemia   . Iron deficiency   . Lumbar spondylosis W/ RADICULOPATHY  . OSA on CPAP   . PAF (paroxysmal atrial fibrillation) ([place])   . Presence of permanent cardiac pacemaker DDD-  06-04-10-   DDD; SECONDARY TO SYNCOPY AND BRADYCARDIA  . Prostate cancer ([place]) 2008   S/P 95 RADIATION  [place]    . Restless leg syndrome   . Stroke [facility]) Oct. 14, 2014   light left hand, balance issues, short term memory loss 2014  . SVT (supraventricular tachycardia) ([place])    S/P ABLATION   2008    Past Surgical History:  Procedure Laterality Date  . BACK SURGERY    . BIV UPGRADE N/A 09/07/2017   Procedure: BIV PACEMAKER UPGRADE;  Surgeon: Evans Lance, MD;  Location: [facility];  Service: Cardiovascular;  Laterality: N/A;  . BREAST SURGERY     "removed tumor; don't remember which side; years ago" (02/28/2017)  . CARDIAC ELECTROPHYSIOLOGY STUDY AND ABLATION  2008   FOR SVT  . CARDIAC PACEMAKER PLACEMENT  06-04-10   DDD  . CARDIOVERSION N/A 03/02/2017   Procedure: CARDIOVERSION;  Surgeon: Josue Hector, MD;  Location: [facility] ENDOSCOPY;  Service: Cardiovascular;  Laterality: N/A;  . CATARACT EXTRACTION W/ INTRAOCULAR LENS  IMPLANT, BILATERAL Bilateral   . CHOLECYSTECTOMY  2009  . ESOPHAGOGASTRODUODENOSCOPY N/A 12/12/2014   Procedure: ESOPHAGOGASTRODUODENOSCOPY (EGD);  Surgeon: Wilford Corner, MD;  Location: [facility] ENDOSCOPY;  Service: Endoscopy;   Laterality: N/A;  . INCISION AND DRAINAGE Right 1997   "knee replacement got infected"  . INGUINAL HERNIA REPAIR Left 06/2003    DONE WITH PENILE PROSTESIS SURG.  . INGUINAL HERNIA REPAIR Right 1962  . JOINT REPLACEMENT    . KNEE ARTHROSCOPY  06/02/2011   Procedure: ARTHROSCOPY KNEE;  Surgeon: Gearlean Alf;  Location: [place];  Service: Orthopedics;  Laterality: Left;  LEFT KNEE ARTHROSCOPY WITH DEBRIDEMENT  . LOOP RECORDER PLACEMENT  01-30-10  . LOOP RECORDER REMOVAL  06/04/2010  . LUMBAR LAMINECTOMY/ DISKECTOMY/ FUSION  05-19-10   L4 - 5  . PENILE PROSTHESIS IMPLANT  06/2003   AND LEFT CORPOROPLASTY /    DONE INGUINAL REPAIR  . PROSTATE BIOPSY  2007  . REVISION TOTAL KNEE ARTHROPLASTY Right 1997  . TOTAL KNEE ARTHROPLASTY  12/20/2011   Procedure: TOTAL KNEE ARTHROPLASTY;  Surgeon: Gearlean Alf, MD;  Location: [facility];  Service: Orthopedics;  Laterality: Left;  . TOTAL KNEE ARTHROPLASTY Right 1997  . TOTAL SHOULDER ARTHROPLASTY Right 06/27/2015   Procedure: REVERSE TOTAL SHOULDER ARTHROPLASTY;  Surgeon: Netta Cedars, MD;  Location: [place];  Service: Orthopedics;  Laterality: Right;  . TRANSURETHRAL RESECTION OF BLADDER TUMOR WITH GYRUS (TURBT-GYRUS)  ?2018  . TRANSURETHRAL RESECTION OF PROSTATE  2006     Inpatient Medications: Scheduled Meds: . apixaban  5 mg Oral BID  . atorvastatin  20 mg Oral Daily  . donepezil  10 mg Oral QHS  . DULoxetine  30 mg Oral QHS  . ferrous sulfate  325 mg Oral BID WC  . folic acid  1 mg Oral QHS  . [START ON 12/24/2017] mouth rinse  15 mL Mouth Rinse BID  . [START ON 12/24/2017] metoprolol succinate  25 mg Oral QHS  . pantoprazole  40 mg Oral QHS   Continuous Infusions: . sodium chloride    . [START ON 12/24/2017] cefTRIAXone (ROCEPHIN)  IV     PRN Meds: sodium chloride, acetaminophen **OR** acetaminophen, oxyCODONE-acetaminophen  Home Meds: Prior to Admission medications   Medication Sig Start Date End Date Taking?  Authorizing Provider  acetaminophen (TYLENOL) 500 MG tablet Take 1,000 mg by mouth 2 (two) times daily.   Yes [provider]  amoxicillin (AMOXIL) 500 MG capsule Take 2,000 mg by mouth See admin instructions. Take 2000 mg by mouth one hour prior to dental apointments 09/02/16  Yes [provider]  Artificial Tear Ointment (LUBRICANT EYE OP) Place 2 drops into both eyes daily as needed (for dry eyes).   Yes [provider]  aspirin 325 MG tablet Take 325 mg by mouth once.   Yes [provider]  atorvastatin (LIPITOR) 20 MG tablet Take 20 mg by mouth daily.   Yes [provider]  cyanocobalamin (,VITAMIN B-12,) 1000 MCG/ML injection Inject 1,000 mcg into the muscle every 30 (thirty) days.    Yes [provider]  dofetilide (TIKOSYN) 500 MCG capsule Take 1 capsule (500 mcg total) by mouth 2 (two) times daily. Patient taking differently: Take 500 mcg by mouth every 12 (twelve) hours. Takes between 10:00 and 11:00 in the AM & PM 10/27/17  Yes Evans Lance,  MD  donepezil (ARICEPT) 10 MG tablet TAKE ONE TABLET BY MOUTH EVERY NIGHT AT BEDTIME 10/27/17  Yes Jaffe, Adam R, DO  DULoxetine (CYMBALTA) 30 MG capsule Take 1 capsule (30 mg total) by mouth daily. Patient taking differently: Take 30 mg by mouth at bedtime.  03/04/17  Yes Baldwin Jamaica, PA-C  ELIQUIS 5 MG TABS tablet TAKE 1 TABLET (5 MG TOTAL) BY MOUTH 2 (TWO) TIMES DAILY. 06/27/17  Yes Evans Lance, MD  ezetimibe (ZETIA) 10 MG tablet Take 1 tablet (10 mg total) by mouth daily. 03/28/15  Yes Dutch Quint B, FNP  ferrous sulfate 325 (65 FE) MG tablet Take 325 mg by mouth 2 (two) times daily with a meal.    Yes [provider]  folic acid (FOLVITE) 1 MG tablet Take 1 mg by mouth at bedtime.    Yes [provider]  furosemide (LASIX) 40 MG tablet Take 80 mg by mouth daily.  02/11/17  Yes [provider]  lisinopril (PRINIVIL,ZESTRIL) 5 MG tablet TAKE ONE TABLET BY MOUTH  DAILY Patient taking differently: TAKE ONE TABLET BY MOUTH in the evening 11/28/17  Yes Evans Lance, MD  loperamide (IMODIUM A-D) 2 MG tablet Take 2 mg by mouth 3 (three) times daily as needed for diarrhea or loose stools.    Yes [provider]  methotrexate (RHEUMATREX) 2.5 MG tablet Take 15 mg by mouth every Monday.  10/02/11  Yes [provider]  metoprolol succinate (TOPROL XL) 25 MG 24 hr tablet Take 1 tablet (25 mg total) by mouth daily. Patient taking differently: Take 25 mg by mouth at bedtime.  06/20/17  Yes Evans Lance, MD  oxyCODONE-acetaminophen (PERCOCET) 10-325 MG tablet Take 0.25-0.5 tablets by mouth 2 (two) times daily as needed for pain.  07/28/17  Yes [provider]  pantoprazole (PROTONIX) 40 MG tablet Take 1 tablet (40 mg total) by mouth daily. Patient taking differently: Take 40 mg by mouth at bedtime.  10/22/15  Yes Dutch Quint B, FNP  predniSONE (DELTASONE) 5 MG tablet Take 5 mg by mouth daily.   Yes [provider]  tamsulosin (FLOMAX) 0.4 MG CAPS capsule Take 0.4 mg by mouth daily. 10/18/17  Yes [provider]  Vitamin D, Ergocalciferol, (DRISDOL) 50000 units CAPS capsule Take 50,000 Units by mouth every Sunday.    Yes [provider]  DULoxetine (CYMBALTA) 30 MG capsule Take 1 capsule (30 mg total) by mouth daily. Patient not taking: Reported on 12/23/2017 12/14/17   Pieter Partridge, DO    Allergies:    Allergies  Allergen Reactions  . Ciprofloxacin Nausea Only and Other (See Comments)    Stomach ache  . Hydrocodone-Acetaminophen Other (See Comments)    Hallucinations in higher doses  . Other Other (See Comments)    Rash from neoprene wrap after knee surgery  . Shellfish Allergy Nausea And Vomiting and Other (See Comments)    Reaction to scallops  . Tramadol Other (See Comments)    Pt becomes combative per wife    Social History:   Social History   Socioeconomic History  . Marital status: Married     Spouse name: Not on file  . Number of children: Not on file  . Years of education: Not on file  . Highest education level: Not on file  Occupational History  . Occupation: retired  Scientific laboratory technician  . Financial resource strain: Not on file  . Food insecurity:    Worry: Not on file  Inability: Not on file  . Transportation needs:    Medical: Not on file    Non-medical: Not on file  Tobacco Use  . Smoking status: Former Smoker    Packs/day: 1.00    Years: 45.00    Pack years: 45.00    Types: Cigarettes    Last attempt to quit: 12/07/1998    Years since quitting: 19.0  . Smokeless tobacco: Never Used  Substance and Sexual Activity  . Alcohol use: Yes    Comment: 1 cocktail per evening  . Drug use: No  . Sexual activity: Not Currently    Partners: Female  Lifestyle  . Physical activity:    Days per week: Not on file    Minutes per session: Not on file  . Stress: Not on file  Relationships  . Social connections:    Talks on phone: Not on file    Gets together: Not on file    Attends religious service: Not on file    Active member of club or organization: Not on file    Attends meetings of clubs or organizations: Not on file    Relationship status: Not on file  . Intimate partner violence:    Fear of current or ex partner: Not on file    Emotionally abused: Not on file    Physically abused: Not on file    Forced sexual activity: Not on file  Other Topics Concern  . Not on file  Social History Narrative  . Not on file    Family History:   The patient's family history includes Early death in his father; Stroke in his mother.  ROS:  Please see the history of present illness.  Review of Systems  Constitutional: Positive for chills, diaphoresis, fever and malaise/fatigue.  Respiratory: Positive for cough, shortness of breath and wheezing.   Cardiovascular: Positive for chest pain, palpitations and leg swelling.  Gastrointestinal: Negative for abdominal pain, blood in  stool, constipation, diarrhea, nausea and vomiting.  Genitourinary: Positive for dysuria, frequency, hematuria and urgency.  Musculoskeletal: Positive for myalgias. Negative for joint pain.  Neurological: Positive for loss of consciousness. Negative for tremors, sensory change, speech change, focal weakness, seizures and weakness.  Endo/Heme/Allergies: Does not bruise/bleed easily.  Psychiatric/Behavioral: Positive for memory loss. Negative for hallucinations. The patient is not nervous/anxious and does not have insomnia.     Physical Exam/Data:   Vitals:   12/23/17 2200 12/23/17 2220 12/23/17 2222 12/23/17 2300  BP: 104/65 (!) 131/100 (!) 131/100 (!) 112/57  Pulse: 75 63 68 67  Resp: (!) 32 (!) 34 (!) 25 (!) 23  Temp:    99.1 F (37.3 C)  TempSrc:    Axillary  SpO2: (!) 89% 96% 93% 100%  Weight:   98.5 kg (217 lb 2.5 oz)   Height:   6\' 3"  (1.905 m)     Intake/Output Summary (Last 24 hours) at 12/23/2017 2339 Last data filed at 12/23/2017 2300 Gross per 24 hour  Intake 2127.65 ml  Output 825 ml  Net 1302.65 ml   Filed Weights   12/23/17 0901 12/23/17 2222  Weight: 98.9 kg (218 lb) 98.5 kg (217 lb 2.5 oz)   Body mass index is 27.14 kg/m.  General: Elderly gentlemen uncomfortable appearing, dyspneic on non-rebreather  Head: Normocephalic, atraumatic, sclera non-icteric, no xanthomas, nares are without discharge.  Neck: Negative for carotid bruits. JVD is elevated. Lungs: Bibasilar rales. Breathing is labored. Heart: RRR with S1 S2. No murmurs, rubs, or gallops appreciated. Abdomen:  Soft, non-tender, non-distended with normoactive bowel sounds. No hepatomegaly. No rebound/guarding. No obvious abdominal masses. Msk:  Strength and tone appear normal for age. Extremities: No clubbing or cyanosis. Right>left lower extremity edema.  Distal pedal pulses are 2+ and equal bilaterally. Neuro: Alert and oriented X 3. No facial asymmetry. No focal deficit. Moves all extremities  spontaneously. Psych:  Responds to questions appropriately with a normal affect.  EKG:  The EKG from 12/23/2017 post-arrest was personally reviewed and demonstrates normal sinus rhythm with biventricular pacing. The QTc interval is 463 milliseconds.   Telemetry reviewed. There appears to be an R wave  On T wave phenomena with subsequent initiation of sustained polymorphic ventricular tachycardia at 21:14.   Relevant CV Studies:  Transthoracic echocardiogram from 12/27/2016 personally reviewed. - Left ventricle: The cavity size was mildly dilated. Wall   thickness was normal. Systolic function was severely reduced. The   estimated ejection fraction was in the range of 20% to 25%.   Severe diffuse hypokinesis with no identifiable regional   variations. - Ventricular septum: Septal motion showed abnormal function,   dyssynergy, and paradox. These changes are consistent with   intraventricular conduction delay. - Aortic valve: Transvalvular velocity was increased less than   expected, due to low cardiac output. There was mild to moderate   stenosis. Valve area (VTI): 1.37 cm^2. Valve area (Vmax): 1.45   cm^2. Valve area (Vmean): 1.36 cm^2. - Mitral valve: There was mild regurgitation. - Left atrium: The atrium was moderately dilated. - Right atrium: The atrium was mildly dilated.  Impressions:  - Compared to 2016 images, there has been a marked reduction in   left ventricular systolic function.    Laboratory Data:  Chemistry Recent Labs  Lab 12/23/17 0813 12/23/17 0845 12/23/17 2205  NA 142 142 143  K 3.7 3.7 4.5  CL 109 107 112*  CO2 22  --  16*  GLUCOSE 127* 124* 200*  BUN 26* 26* 28*  CREATININE 1.37* 1.10 1.66*  CALCIUM 9.4  --  8.9  GFRNONAA 46*  --  37*  GFRAA 54*  --  43*  ANIONGAP 11  --  15    Recent Labs  Lab 12/23/17 0813  PROT 6.0*  ALBUMIN 3.1*  AST 31  ALT 28  ALKPHOS 85  BILITOT 0.8   Hematology Recent Labs  Lab 12/23/17 0813 12/23/17 0845  12/23/17 2205  WBC 4.3  --  33.5*  RBC 4.33  --  4.13*  HGB 12.1* 12.2* 11.5*  HCT 39.6 36.0* 39.0  MCV 91.5  --  94.4  MCH 27.9  --  27.8  MCHC 30.6  --  29.5*  RDW 15.9*  --  16.2*  PLT 235  --  217   Cardiac Enzymes Recent Labs  Lab 12/23/17 2205  TROPONINI 1.55*   No results for input(s): TROPIPOC in the last 168 hours.  BNPNo results for input(s): BNP, PROBNP in the last 168 hours.  DDimer No results for input(s): DDIMER in the last 168 hours.  Radiology/Studies:  Dg Chest Port 1 View  Result Date: 12/23/2017 CLINICAL DATA:  Fluid overload EXAM: PORTABLE CHEST 1 VIEW COMPARISON:  12/23/2017 FINDINGS: Cardiac pacemaker. Cardiac enlargement with pulmonary vascular congestion. Since the prior study, there is increasing airspace and interstitial infiltration in the lungs suggesting progression of pulmonary edema. No pleural effusions. No pneumothorax. Mediastinal contours appear intact. Calcification of the aorta. Degenerative changes in the spine and left shoulder. Postoperative change in the right shoulder. IMPRESSION: Cardiac  enlargement with pulmonary vascular congestion. Increasing pulmonary edema since previous study. Electronically Signed   By: Lucienne Capers M.D.   On: 12/23/2017 22:01   Dg Chest Port 1 View  Result Date: 12/23/2017 CLINICAL DATA:  Sepsis, UTI symptoms, hematuria, history stroke, pacemaker/SVT, hypertension, former smoker EXAM: PORTABLE CHEST 1 VIEW COMPARISON:  Portable exam 0816 hours compared to 10/29/2017 FINDINGS: LEFT subclavian transvenous pacemaker with leads projecting over RIGHT atrium, RIGHT ventricle, and coronary sinus. Enlargement of cardiac silhouette with pulmonary vascular congestion. Atherosclerotic calcification aorta. Hazy interstitial infiltrate bilaterally likely pulmonary edema. No segmental consolidation, pleural effusion or pneumothorax. Bones demineralized. Reverse RIGHT shoulder arthroplasty noted. IMPRESSION: Enlargement of cardiac  silhouette with vascular congestion and probable mild pulmonary edema. Electronically Signed   By: Lavonia Dana M.D.   On: 12/23/2017 08:25    Assessment and Plan:   Christopher Baker is a 84 y.o. male with paroxysmal atrial fibrillation ([place] = 6) on apixiban and dofetilide, non-ischemic cardiomyopathy, mild to moderate aortic stenosis, complete heart block status-post biventricular Medtronic pacemaker implantation, who is being seen today for the evaluation of polymorphic ventricular tachycaria arrest at the request of the pulmonary critical care team.  1. Cardiac arrest due to polymorphic ventricular tachycardia. Suspect that this is TdP precpitated by long-short phenomena in the setting of atrial fibrillation with rapid ventricular response, critical illness (septic shock). No suspicion for ischemia as no preceding chest discomfort and post-arrest EKG without ischemic changes.   Hold dofetilide for now. Patient previously stable on dofetilide with excellent rhythm control but considering QTc prolongation associated with this medication will hold.   Hold metoprolol with severe sepsis / septic shock.  Aggressively monitor / supplement K>4 and Mg>2  Avoid all QTc prolonging agents for now.  Continue telemetry.  Cardiology will discuss plan tomorrow with the patient's primary electrophysiologist.   Page cardiology with any questions or concerns.  2. Acute systolic heart failure with acute hypoxic respiratory failure secondary to pulmonary edema. He has evidence of decompensated heart failure on examination with pulmonary edema on chest-xray and exam in the setting of aggressive fluid resuscitation for his septic shock.   Diuresis with careful attention to electrolytes; would prioritize normalization of electrolytes for now with recent VT arrest.  I would check a blood gas support him with BIPAP if necessary.   Hold home metoprolol succinate and lisinopril due to sepsis.   3. Atrial  fibrillation, paroxysmal with elevated CHADS2VASc of 6.  Continue home [place].   For questions or updates, please contact [place] Please consult [email or web address] for contact info under Cardiology/STEMI.    Signed, Perley Jain, MD  12/23/2017 11:39 PM

## 2017-12-23 NOTE — H&P (Addendum)
History and Physical    [name] OFB:[id number] DOB: Sep 25, 1933 DOA: 12/23/2017  I have briefly reviewed the patient's prior medical records in [place]  PCP: Tisovec, Fransico Him, MD  Patient coming from: Home  Chief Complaint: Acute encephalopathy  HPI: Christopher Baker is a 84 y.o. male with medical history significant of paroxysmal A. fib currently has a pacemaker, prior CVA, chronic systolic CHF with an EF of 25%, mild dementia, hypertension, hyperlipidemia, who is being brought to the hospital by his wife after she found him early this morning being confused and poorly responsive.  EMS was called and patient was brought to the ED.  He is sleepy in the emergency room, wakes up intermittently but cannot stay awake to hold a conversation.  Per wife, he was in normal state of health until yesterday, and this morning he was more confused and she could not wake him up.  He has been having difficulties with fluid overload recently, so his cardiologist to place patient on Lasix as well as compression stockings.  Wife tells me that has not helped as much.  He denies recently any chest pain, abdominal pain, nausea or vomiting.  Of note, he was recently hospitalized last month with sepsis due to urinary tract infection.  ED Course: In the ED he is febrile to 104, goes in and out of A. fib with rates between 90s and 150s, his blood pressure had couple of low readings into the 90s but on my evaluation he is normotensive.  He was given vancomycin and Zosyn received cultures, he was given IV fluids per protocol given lactic acid of 4 and we were asked to admit to stepdown for sepsis.  Review of Systems: Unable to complete review of system due to altered mental status  Past Medical History:  Diagnosis Date  . Anxiety   . Arthritis    "hands, right hip; lower back" (02/28/2017)  . Arthritis with psoriasis ([place])   . Borderline diabetes    DIET CONTROLLED - PT STATES HE IS NOT DIABETIC  .  Carotid stenosis, bilateral PER DR EARLY / DUPLEX  09-09-10     40 - 59%   BILATERALLY--- ASYMPTOMATIC  . Chronic lower back pain   . Depression   . Dysrhythmia    a-fib  . GERD (gastroesophageal reflux disease)    CONTROLLED W/ PROTONIX  . History of gout   . HTN (hypertension)   . Hyperlipidemia   . Iron deficiency   . Lumbar spondylosis W/ RADICULOPATHY  . OSA on CPAP   . PAF (paroxysmal atrial fibrillation) ([place])   . Presence of permanent cardiac pacemaker DDD-  06-04-10-   DDD; SECONDARY TO SYNCOPY AND BRADYCARDIA  . Prostate cancer ([place]) 2008   S/P 72 RADIATION  [place]    . Restless leg syndrome   . Stroke [facility]) Oct. 14, 2014   light left hand, balance issues, short term memory loss 2014  . SVT (supraventricular tachycardia) ([place])    S/P ABLATION   2008    Past Surgical History:  Procedure Laterality Date  . BACK SURGERY    . BIV UPGRADE N/A 09/07/2017   Procedure: BIV PACEMAKER UPGRADE;  Surgeon: Evans Lance, MD;  Location: [facility];  Service: Cardiovascular;  Laterality: N/A;  . BREAST SURGERY     "removed tumor; don't remember which side; years ago" (02/28/2017)  . CARDIAC ELECTROPHYSIOLOGY STUDY AND ABLATION  2008   FOR SVT  . CARDIAC PACEMAKER PLACEMENT  06-04-10   DDD  . CARDIOVERSION N/A 03/02/2017   Procedure: CARDIOVERSION;  Surgeon: Josue Hector, MD;  Location: [facility] ENDOSCOPY;  Service: Cardiovascular;  Laterality: N/A;  . CATARACT EXTRACTION W/ INTRAOCULAR LENS  IMPLANT, BILATERAL Bilateral   . CHOLECYSTECTOMY  2009  . ESOPHAGOGASTRODUODENOSCOPY N/A 12/12/2014   Procedure: ESOPHAGOGASTRODUODENOSCOPY (EGD);  Surgeon: Wilford Corner, MD;  Location: [facility] ENDOSCOPY;  Service: Endoscopy;  Laterality: N/A;  . INCISION AND DRAINAGE Right 1997   "knee replacement got infected"  . INGUINAL HERNIA REPAIR Left 06/2003    DONE WITH PENILE PROSTESIS SURG.  . INGUINAL HERNIA REPAIR Right 1962  . JOINT REPLACEMENT    . KNEE ARTHROSCOPY  06/02/2011    Procedure: ARTHROSCOPY KNEE;  Surgeon: Gearlean Alf;  Location: [place];  Service: Orthopedics;  Laterality: Left;  LEFT KNEE ARTHROSCOPY WITH DEBRIDEMENT  . LOOP RECORDER PLACEMENT  01-30-10  . LOOP RECORDER REMOVAL  06/04/2010  . LUMBAR LAMINECTOMY/ DISKECTOMY/ FUSION  05-19-10   L4 - 5  . PENILE PROSTHESIS IMPLANT  06/2003   AND LEFT CORPOROPLASTY /    DONE INGUINAL REPAIR  . PROSTATE BIOPSY  2007  . REVISION TOTAL KNEE ARTHROPLASTY Right 1997  . TOTAL KNEE ARTHROPLASTY  12/20/2011   Procedure: TOTAL KNEE ARTHROPLASTY;  Surgeon: Gearlean Alf, MD;  Location: [facility];  Service: Orthopedics;  Laterality: Left;  . TOTAL KNEE ARTHROPLASTY Right 1997  . TOTAL SHOULDER ARTHROPLASTY Right 06/27/2015   Procedure: REVERSE TOTAL SHOULDER ARTHROPLASTY;  Surgeon: Netta Cedars, MD;  Location: [place];  Service: Orthopedics;  Laterality: Right;  . TRANSURETHRAL RESECTION OF BLADDER TUMOR WITH GYRUS (TURBT-GYRUS)  ?2018  . TRANSURETHRAL RESECTION OF PROSTATE  2006     reports that he quit smoking about 19 years ago. His smoking use included cigarettes. He has a 45.00 pack-year smoking history. He has never used smokeless tobacco. He reports that he drinks alcohol. He reports that he does not use drugs.  Allergies  Allergen Reactions  . Ciprofloxacin Nausea Only and Other (See Comments)    Stomach ache  . Hydrocodone-Acetaminophen Other (See Comments)    Hallucinations in higher doses  . Other Other (See Comments)    Rash from neoprene wrap after knee surgery  . Shellfish Allergy Nausea And Vomiting and Other (See Comments)    Reaction to scallops  . Tramadol Other (See Comments)    Pt becomes combative per wife    Family History  Problem Relation Age of Onset  . Stroke Mother   . Early death Father     Prior to Admission medications   Medication Sig Start Date End Date Taking? Authorizing Provider  acetaminophen (TYLENOL) 500 MG tablet Take 1,000 mg by mouth 2 (two)  times daily.   Yes [provider]  amoxicillin (AMOXIL) 500 MG capsule Take 2,000 mg by mouth See admin instructions. Take 2000 mg by mouth one hour prior to dental apointments 09/02/16  Yes [provider]  Artificial Tear Ointment (LUBRICANT EYE OP) Place 2 drops into both eyes daily as needed (for dry eyes).   Yes [provider]  aspirin 325 MG tablet Take 325 mg by mouth once.   Yes [provider]  atorvastatin (LIPITOR) 20 MG tablet Take 20 mg by mouth daily.   Yes [provider]  cyanocobalamin (,VITAMIN B-12,) 1000 MCG/ML injection Inject 1,000 mcg into the muscle every 30 (thirty) days.    Yes [provider]  dofetilide (TIKOSYN) 500 MCG capsule Take  1 capsule (500 mcg total) by mouth 2 (two) times daily. Patient taking differently: Take 500 mcg by mouth every 12 (twelve) hours. Takes between 10:00 and 11:00 in the AM & PM 10/27/17  Yes Evans Lance, MD  donepezil (ARICEPT) 10 MG tablet TAKE ONE TABLET BY MOUTH EVERY NIGHT AT BEDTIME 10/27/17  Yes Jaffe, Adam R, DO  DULoxetine (CYMBALTA) 30 MG capsule Take 1 capsule (30 mg total) by mouth daily. Patient taking differently: Take 30 mg by mouth at bedtime.  03/04/17  Yes Baldwin Jamaica, PA-C  ELIQUIS 5 MG TABS tablet TAKE 1 TABLET (5 MG TOTAL) BY MOUTH 2 (TWO) TIMES DAILY. 06/27/17  Yes Evans Lance, MD  ezetimibe (ZETIA) 10 MG tablet Take 1 tablet (10 mg total) by mouth daily. 03/28/15  Yes Dutch Quint B, FNP  ferrous sulfate 325 (65 FE) MG tablet Take 325 mg by mouth 2 (two) times daily with a meal.    Yes [provider]  folic acid (FOLVITE) 1 MG tablet Take 1 mg by mouth at bedtime.    Yes [provider]  furosemide (LASIX) 40 MG tablet Take 80 mg by mouth daily.  02/11/17  Yes [provider]  lisinopril (PRINIVIL,ZESTRIL) 5 MG tablet TAKE ONE TABLET BY MOUTH DAILY Patient taking differently: TAKE ONE TABLET BY MOUTH in the evening 11/28/17  Yes  Evans Lance, MD  loperamide (IMODIUM A-D) 2 MG tablet Take 2 mg by mouth 3 (three) times daily as needed for diarrhea or loose stools.    Yes [provider]  methotrexate (RHEUMATREX) 2.5 MG tablet Take 15 mg by mouth every Monday.  10/02/11  Yes [provider]  metoprolol succinate (TOPROL XL) 25 MG 24 hr tablet Take 1 tablet (25 mg total) by mouth daily. Patient taking differently: Take 25 mg by mouth at bedtime.  06/20/17  Yes Evans Lance, MD  oxyCODONE-acetaminophen (PERCOCET) 10-325 MG tablet Take 0.25-0.5 tablets by mouth 2 (two) times daily as needed for pain.  07/28/17  Yes [provider]  pantoprazole (PROTONIX) 40 MG tablet Take 1 tablet (40 mg total) by mouth daily. Patient taking differently: Take 40 mg by mouth at bedtime.  10/22/15  Yes Dutch Quint B, FNP  predniSONE (DELTASONE) 5 MG tablet Take 5 mg by mouth daily.   Yes [provider]  tamsulosin (FLOMAX) 0.4 MG CAPS capsule Take 0.4 mg by mouth daily. 10/18/17  Yes [provider]  Vitamin D, Ergocalciferol, (DRISDOL) 50000 units CAPS capsule Take 50,000 Units by mouth every Sunday.    Yes [provider]  DULoxetine (CYMBALTA) 30 MG capsule Take 1 capsule (30 mg total) by mouth daily. Patient not taking: Reported on 12/23/2017 12/14/17   Pieter Partridge, DO    Physical Exam: Vitals:   12/23/17 0901 12/23/17 0915 12/23/17 0930 12/23/17 1000  BP:  136/65 115/61 (!) 106/51  Pulse:  83 84 (!) 133  Resp:  (!) 30 (!) 32 (!) 25  Temp:      TempSrc:      SpO2:  95% 96% 96%  Weight: 98.9 kg (218 lb)     Height: 6\' 3"  (1.905 m)         Constitutional: NAD, lethargic Eyes: lids and conjunctivae normal ENMT: Mucous membranes are dry Neck: normal, supple Respiratory: faint bibasilar crackles, no wheezing. Moves air well  Cardiovascular: irregular, 3/6 SEM. 2+ pitting LE edema. 2+ pedal pulses.  Abdomen: no tenderness, no masses palpated. Bowel sounds  positive.  Skin:  no rashes, lesions, ulcers. No induration Neurologic: non focal, equal strength  Psychiatric: Alert and oriented x person/location.  Labs on Admission: I have personally reviewed following labs and imaging studies  CBC: Recent Labs  Lab 12/23/17 0813 12/23/17 0845  WBC 4.3  --   NEUTROABS 4.1  --   HGB 12.1* 12.2*  HCT 39.6 36.0*  MCV 91.5  --   PLT 235  --    Basic Metabolic Panel: Recent Labs  Lab 12/23/17 0813 12/23/17 0845  NA 142 142  K 3.7 3.7  CL 109 107  CO2 22  --   GLUCOSE 127* 124*  BUN 26* 26*  CREATININE 1.37* 1.10  CALCIUM 9.4  --    GFR: Estimated Creatinine Clearance: 61.9 mL/min (by C-G formula based on SCr of 1.1 mg/dL). Liver Function Tests: Recent Labs  Lab 12/23/17 0813  AST 31  ALT 28  ALKPHOS 85  BILITOT 0.8  PROT 6.0*  ALBUMIN 3.1*   No results for input(s): LIPASE, AMYLASE in the last 168 hours. No results for input(s): AMMONIA in the last 168 hours. Coagulation Profile: No results for input(s): INR, PROTIME in the last 168 hours. Cardiac Enzymes: No results for input(s): CKTOTAL, CKMB, CKMBINDEX, TROPONINI in the last 168 hours. BNP (last 3 results) No results for input(s): PROBNP in the last 8760 hours. HbA1C: No results for input(s): HGBA1C in the last 72 hours. CBG: No results for input(s): GLUCAP in the last 168 hours. Lipid Profile: No results for input(s): CHOL, HDL, LDLCALC, TRIG, CHOLHDL, LDLDIRECT in the last 72 hours. Thyroid Function Tests: No results for input(s): TSH, T4TOTAL, FREET4, T3FREE, THYROIDAB in the last 72 hours. Anemia Panel: No results for input(s): VITAMINB12, FOLATE, FERRITIN, TIBC, IRON, RETICCTPCT in the last 72 hours. Urine analysis:    Component Value Date/Time   COLORURINE RED (A) 12/23/2017 0844   APPEARANCEUR CLOUDY (A) 12/23/2017 0844   LABSPEC  12/23/2017 0844    TEST NOT REPORTED DUE TO COLOR INTERFERENCE OF URINE PIGMENT   LABSPEC 1.010 01/19/2006 1347   PHURINE  12/23/2017 0844     TEST NOT REPORTED DUE TO COLOR INTERFERENCE OF URINE PIGMENT   GLUCOSEU (A) 12/23/2017 0844    TEST NOT REPORTED DUE TO COLOR INTERFERENCE OF URINE PIGMENT   HGBUR (A) 12/23/2017 0844    TEST NOT REPORTED DUE TO COLOR INTERFERENCE OF URINE PIGMENT   HGBUR 2+ 04/13/2010 0800   BILIRUBINUR (A) 12/23/2017 0844    TEST NOT REPORTED DUE TO COLOR INTERFERENCE OF URINE PIGMENT   BILIRUBINUR Negative 01/19/2006 1347   KETONESUR (A) 12/23/2017 0844    TEST NOT REPORTED DUE TO COLOR INTERFERENCE OF URINE PIGMENT   PROTEINUR (A) 12/23/2017 0844    TEST NOT REPORTED DUE TO COLOR INTERFERENCE OF URINE PIGMENT   UROBILINOGEN 1.0 12/11/2014 1349   NITRITE (A) 12/23/2017 0844    TEST NOT REPORTED DUE TO COLOR INTERFERENCE OF URINE PIGMENT   LEUKOCYTESUR (A) 12/23/2017 0844    TEST NOT REPORTED DUE TO COLOR INTERFERENCE OF URINE PIGMENT   LEUKOCYTESUR Small 01/19/2006 1347     Radiological Exams on Admission: Dg Chest Port 1 View  Result Date: 12/23/2017 CLINICAL DATA:  Sepsis, UTI symptoms, hematuria, history stroke, pacemaker/SVT, hypertension, former smoker EXAM: PORTABLE CHEST 1 VIEW COMPARISON:  Portable exam 0816 hours compared to 10/29/2017 FINDINGS: LEFT subclavian transvenous pacemaker with leads projecting over RIGHT atrium, RIGHT ventricle, and coronary sinus. Enlargement of cardiac silhouette with pulmonary vascular congestion.  Atherosclerotic calcification aorta. Hazy interstitial infiltrate bilaterally likely pulmonary edema. No segmental consolidation, pleural effusion or pneumothorax. Bones demineralized. Reverse RIGHT shoulder arthroplasty noted. IMPRESSION: Enlargement of cardiac silhouette with vascular congestion and probable mild pulmonary edema. Electronically Signed   By: Lavonia Dana M.D.   On: 12/23/2017 08:25    EKG: Independently reviewed.  A. fib  Assessment/Plan Active Problems:   Sepsis ([place])   Sepsis due to probable urinary tract infection -Urinalysis with many  bacteria, blood cultures and urine cultures ordered and are pending -He was given vancomycin and Zosyn, continue -He was given 30 cc ML's per kilogram per protocol, recheck lactic acid in 6 hours, Christopher hold further additional fluids given systolic CHF, if he becomes hypotensive Christopher need fluids and probably transfer to the ICU  Paroxysmal A. fib -Flipping between sinus and A. fib in the emergency room on telemetry, resume home Tikosyn, continue Eliquis  Acute on chronic systolic CHF -Most recent 2D echo was done and June 2018 showed an EF of 20-25%, stable mild pulmonary vascular congestion, he has lower extremity edema which seems unchanged.  Wife did mention that he has had vascular congestion on his chest x-ray in the past as an outpatient.  His respiratory status currently appears stable, but Christopher have to be closely monitored in stepdown as he received IV fluids  Hypertension -For now hold antihypertensives given sepsis picture  Hyperlipidemia -On statin  OA /psoriatic arthritis -Patient is on chronic steroids, give Solu-Cortef, hold methotrexate  Mild anemia of chronic disease -Hemoglobin stable   DVT prophylaxis: Eliquis  Code Status: Full code   Family Communication: wife at bedside Disposition Plan: admit to SDU Consults called: none     Admission status: inpatient    At the time of admission, it appears that the appropriate admission status for this patient is INPATIENT. This is judged to be reasonable and necessary in order to provide the required high service intensity to ensure the patient's safety given the presenting symptoms, physical exam findings, and initial radiographic and laboratory data in the context of their chronic comorbidities. Current circumstances are sepsis, CHF, and it is felt to place patient at high risk for further clinical deterioration threatening life, limb, or organ. Moreover, it is my clinical judgment that the patient Christopher require inpatient  hospital care spanning beyond 2 midnights from the point of admission and that early discharge would result in unnecessary risk of decompensation and readmission or threat to life, limb or bodily function.   Marzetta Board, MD [facility] Pager [phone]  If 7PM-7AM, please contact night-coverage [email or web address] Password [email or web address]  12/23/2017, 10:36 AM

## 2017-12-23 NOTE — ED Triage Notes (Addendum)
Pt arriving via [facility] from home for UTI symptoms.  Pt leaked urine onto his bed that was bright red.  CBG 123, 168/98, RR 22, pacer HR 90. Pt has short term memory loss from a previous stroke. Pt currently on Eloquis.

## 2017-12-23 NOTE — ED Notes (Signed)
Report given to Ryan. 

## 2017-12-23 NOTE — ED Notes (Signed)
Irrigated foley with 30cc saline.

## 2017-12-23 NOTE — ED Notes (Signed)
Per pt wife, pt normally knows year and can answer orientation questions appropriately, he just has some short term memory loss.  Pt is altered and unable to answer these questions at this time.

## 2017-12-23 NOTE — Progress Notes (Signed)
PHARMACY - PHYSICIAN COMMUNICATION CRITICAL VALUE ALERT - BLOOD CULTURE IDENTIFICATION (BCID)  Christopher Baker is an 84 y.o. male who presented to [facility] on 12/23/2017 with a chief complaint of UTI symptoms and AMS  Assessment:  41 YOM with recent history of E.coli urosepsis in May who is currently being treated for concern for urosepsis. Now with 2/2 BCx growing GNR with BCID detecting E.coli.   Of note, E. Coli isolate in May was pan-S except R-amp/unasyn  Name of physician (or Provider) Contacted: Opyd  Current antibiotics: Vancomycin + Zosyn   Changes to prescribed antibiotics recommended:  Will narrow antibiotics to Rocephin 2g IV every 24 hours  Results for orders placed or performed during the hospital encounter of 10/29/17  Blood Culture ID Panel (Reflexed) (Collected: 10/29/2017 10:20 PM)  Result Value Ref Range   Enterococcus species NOT DETECTED NOT DETECTED   Listeria monocytogenes NOT DETECTED NOT DETECTED   Staphylococcus species NOT DETECTED NOT DETECTED   Staphylococcus aureus NOT DETECTED NOT DETECTED   Streptococcus species NOT DETECTED NOT DETECTED   Streptococcus agalactiae NOT DETECTED NOT DETECTED   Streptococcus pneumoniae NOT DETECTED NOT DETECTED   Streptococcus pyogenes NOT DETECTED NOT DETECTED   Acinetobacter baumannii NOT DETECTED NOT DETECTED   Enterobacteriaceae species DETECTED (A) NOT DETECTED   Enterobacter cloacae complex NOT DETECTED NOT DETECTED   Escherichia coli DETECTED (A) NOT DETECTED   Klebsiella oxytoca NOT DETECTED NOT DETECTED   Klebsiella pneumoniae NOT DETECTED NOT DETECTED   Proteus species NOT DETECTED NOT DETECTED   Serratia marcescens NOT DETECTED NOT DETECTED   Carbapenem resistance NOT DETECTED NOT DETECTED   Haemophilus influenzae NOT DETECTED NOT DETECTED   Neisseria meningitidis NOT DETECTED NOT DETECTED   Pseudomonas aeruginosa NOT DETECTED NOT DETECTED   Candida albicans NOT DETECTED NOT DETECTED   Candida glabrata  NOT DETECTED NOT DETECTED   Candida krusei NOT DETECTED NOT DETECTED   Candida parapsilosis NOT DETECTED NOT DETECTED   Candida tropicalis NOT DETECTED NOT DETECTED    Lawson Radar 12/23/2017  9:27 PM

## 2017-12-23 NOTE — Progress Notes (Signed)
Pharmacy Antibiotic Note  Christopher Baker is a 84 y.o. male admitted on 12/23/2017 with sepsis.  Pharmacy has been consulted for Zosyn and vancomycin dosing.  SCr 1.1, CrCl ~44ml/min.  Plan: Start Zosyn 3.375 gm IV q8h (4 hour infusion) Give vancomycin 2g IV x 1, then start vancomycin 750mg  IV Q12h Monitor clinical picture, renal function, VT prn F/U C&S, abx deescalation / LOT    Temp (24hrs), Avg:104 F (40 C), Min:104 F (40 C), Max:104 F (40 C)  No results for input(s): WBC, CREATININE, LATICACIDVEN, VANCOTROUGH, VANCOPEAK, VANCORANDOM, GENTTROUGH, GENTPEAK, GENTRANDOM, TOBRATROUGH, TOBRAPEAK, TOBRARND, AMIKACINPEAK, AMIKACINTROU, AMIKACIN in the last 168 hours.  CrCl cannot be calculated (Patient's most recent lab result is older than the maximum 21 days allowed.).    Allergies  Allergen Reactions  . Ciprofloxacin Nausea Only and Other (See Comments)    Stomach ache  . Hydrocodone-Acetaminophen Other (See Comments)    Hallucinations in higher doses  . Other Other (See Comments)    Rash from neoprene wrap after knee surgery  . Shellfish Allergy Nausea And Vomiting and Other (See Comments)    Reaction to scallops  . Tramadol Other (See Comments)    Pt becomes combative per wife    Thank you for allowing pharmacy to be a part of this patient's care.  Reginia Naas 12/23/2017 8:18 AM

## 2017-12-23 NOTE — Progress Notes (Addendum)
Patient ID: Christopher Baker, male   DOB: 1933/11/24, 84 y.o.   MRN: [id number]                                                                PROGRESS NOTE                                                                                                                                                                                                             Patient Demographics:    Christopher Baker, is a 84 y.o. male, DOB - 1934-04-06, WER:[id number]  Admit date - 12/23/2017   Admitting Physician Christopher Karlyne Greenspan, MD  Outpatient Primary MD for the patient is Tisovec, Fransico Him, MD  LOS - 0  Outpatient Specialists:     Chief Complaint  Patient presents with  . Urinary Tract Infection       Brief Narrative    84 y.o. male with medical history significant of paroxysmal A. fib currently has a pacemaker, prior CVA, chronic systolic CHF with an EF of 25%, mild dementia, hypertension, hyperlipidemia, who is being brought to the hospital by his wife after she found Baker early this morning being confused and poorly responsive.  EMS was called and patient was brought to the ED.  He is sleepy in the emergency room, wakes up intermittently but cannot stay awake to hold a conversation.  Per wife, he was in normal state of health until yesterday, and this morning he was more confused and she could not wake Baker up.  He has been having difficulties with fluid overload recently, so his cardiologist to place patient on Lasix as well as compression stockings.  Wife tells me that has not helped as much.  He denies recently any chest pain, abdominal pain, nausea or vomiting.  Of note, he was recently hospitalized last month with sepsis due to urinary tract infection.  ED Course: In the ED he is febrile to 104, goes in and out of A. fib with rates between 90s and 150s, his blood pressure had couple of low readings into the 90s but on my evaluation he is normotensive.  He was given vancomycin and Zosyn received cultures, he  was given IV fluids per protocol given lactic acid of 4 and we were asked to admit to stepdown for  sepsis.     Subjective:    Christopher Baker today apparently was eating an ice and went unresponsive.  He was found in pulselss VT according to the code team and shocked.  Pt regained bp    Assessment  & Plan :    Active Problems:   Sepsis ([place])   Pulseless VT Trop I q6h x3 TSH Magnesium, Phos , Cbc cmp CXR Cardiac echo Cardiology consult to manage VT, spoke with cardiology fellow and attending Christopher Baker consulted to be Attending while in CCU (spoke with Christopher Baker) Move to ICU 2 Heart    Code Status :  FULL CODE  Family Communication  : w patient   Disposition Plan  :  TBD  Barriers For Discharge :   Consults  :  Cardiology for transfer to CCU  Procedures  :   Echo ordered  DVT Prophylaxis  :  Eliquis, SCDs  Lab Results  Component Value Date   PLT 235 12/23/2017    Antibiotics  :  Vanco, zosyn iv  Anti-infectives (From admission, onward)   Start     Dose/Rate Route Frequency Ordered Stop   12/23/17 2100  vancomycin (VANCOCIN) IVPB 750 mg/150 ml premix     750 mg 150 mL/hr over 60 Minutes Intravenous Every 12 hours 12/23/17 1005     12/23/17 1500  piperacillin-tazobactam (ZOSYN) IVPB 3.375 g     3.375 g 12.5 mL/hr over 240 Minutes Intravenous Every 8 hours 12/23/17 1043     12/23/17 0830  vancomycin (VANCOCIN) 2,000 mg in sodium chloride 0.9 % 500 mL IVPB     2,000 mg 250 mL/hr over 120 Minutes Intravenous  Once 12/23/17 0817 12/23/17 1101   12/23/17 0815  piperacillin-tazobactam (ZOSYN) IVPB 3.375 g     3.375 g 100 mL/hr over 30 Minutes Intravenous  Once 12/23/17 0813 12/23/17 0943   12/23/17 0815  vancomycin (VANCOCIN) IVPB 1000 mg/200 mL premix  Status:  Discontinued     1,000 mg 200 mL/hr over 60 Minutes Intravenous  Once 12/23/17 0813 12/23/17 0817        Objective:   Vitals:   12/23/17 1303 12/23/17 1421 12/23/17 1558 12/23/17 1703  BP: (!)  77/50 (!) 93/46  103/72  Pulse: (!) 57 (!) 54  68  Resp: (!) 31 (!) 25  (!) 26  Temp:   98.3 F (36.8 C)   TempSrc:   Oral   SpO2: 98% 96%  97%  Weight:      Height:        Wt Readings from Last 3 Encounters:  12/23/17 98.9 kg (218 lb)  12/09/17 98.9 kg (218 lb)  11/09/17 95.3 kg (210 lb)     Intake/Output Summary (Last 24 hours) at 12/23/2017 2135 Last data filed at 12/23/2017 1900 Gross per 24 hour  Intake 1518.33 ml  Output 75 ml  Net 1443.33 ml     Physical Exam  Awake Alert, Oriented X 3, No new F.N deficits, Normal affect [place].AT,PERRAL Supple Neck,No JVD, No cervical lymphadenopathy appriciated.  Symmetrical Chest wall movement, Good air movement bilaterally, crackles bilateral bases,  RRR,No Gallops,Rubs or new Murmurs, No Parasternal Heave +ve B.Sounds, Abd Soft, No tenderness, No organomegaly appriciated, No rebound - guarding or rigidity. No Cyanosis, Clubbing or edema, No new Rash or bruise     Data Review:    CBC Recent Labs  Lab 12/23/17 0813 12/23/17 0845  WBC 4.3  --   HGB 12.1* 12.2*  HCT 39.6 36.0*  PLT 235  --  MCV 91.5  --   MCH 27.9  --   MCHC 30.6  --   RDW 15.9*  --   LYMPHSABS 0.2*  --   MONOABS 0.0*  --   EOSABS 0.0  --   BASOSABS 0.0  --     Chemistries  Recent Labs  Lab 12/23/17 0813 12/23/17 0845  NA 142 142  K 3.7 3.7  CL 109 107  CO2 22  --   GLUCOSE 127* 124*  BUN 26* 26*  CREATININE 1.37* 1.10  CALCIUM 9.4  --   AST 31  --   ALT 28  --   ALKPHOS 85  --   BILITOT 0.8  --    ------------------------------------------------------------------------------------------------------------------ No results for input(s): CHOL, HDL, LDLCALC, TRIG, CHOLHDL, LDLDIRECT in the last 72 hours.  Lab Results  Component Value Date   HGBA1C 6.1 (H) 06/18/2014   ------------------------------------------------------------------------------------------------------------------ No results for input(s): TSH, T4TOTAL, T3FREE,  THYROIDAB in the last 72 hours.  Invalid input(s): FREET3 ------------------------------------------------------------------------------------------------------------------ No results for input(s): VITAMINB12, FOLATE, FERRITIN, TIBC, IRON, RETICCTPCT in the last 72 hours.  Coagulation profile No results for input(s): INR, PROTIME in the last 168 hours.  No results for input(s): DDIMER in the last 72 hours.  Cardiac Enzymes No results for input(s): CKMB, TROPONINI, MYOGLOBIN in the last 168 hours.  Invalid input(s): CK ------------------------------------------------------------------------------------------------------------------ No results found for: BNP  Inpatient Medications  Scheduled Meds: . apixaban  5 mg Oral BID  . atorvastatin  20 mg Oral Daily  . dofetilide  500 mcg Oral Q12H  . donepezil  10 mg Oral QHS  . DULoxetine  30 mg Oral QHS  . ferrous sulfate  325 mg Oral BID WC  . folic acid  1 mg Oral QHS  . hydrocortisone sod succinate (SOLU-CORTEF) inj  50 mg Intravenous Q6H  . [START ON 12/24/2017] mouth rinse  15 mL Mouth Rinse BID  . [START ON 12/24/2017] metoprolol succinate  25 mg Oral QHS  . pantoprazole  40 mg Oral QHS   Continuous Infusions: . sodium chloride 75 mL/hr at 12/23/17 1328  . piperacillin-tazobactam (ZOSYN)  IV 3.375 g (12/23/17 1832)  . vancomycin     PRN Meds:.acetaminophen **OR** acetaminophen, oxyCODONE-acetaminophen  Micro Results Recent Results (from the past 240 hour(s))  Blood Culture (routine x 2)     Status: None (Preliminary result)   Collection Time: 12/23/17  8:13 AM  Result Value Ref Range Status   Specimen Description BLOOD RIGHT ANTECUBITAL  Final   Special Requests   Final    BOTTLES DRAWN AEROBIC AND ANAEROBIC Blood Culture adequate volume   Culture  Setup Time   Final    GRAM NEGATIVE RODS ANAEROBIC BOTTLE ONLY Organism ID to follow CRITICAL RESULT CALLED TO, READ BACK BY AND VERIFIED WITHFerne Coe [facility] 2126 12/23/17 A  BROWNING Performed at [facility], [place] [address] [place], [place] [address]    Culture PENDING  Incomplete   Report Status PENDING  Incomplete  Blood Culture ID Panel (Reflexed)     Status: Abnormal   Collection Time: 12/23/17  8:13 AM  Result Value Ref Range Status   Enterococcus species NOT DETECTED NOT DETECTED Final   Listeria monocytogenes NOT DETECTED NOT DETECTED Final   Staphylococcus species NOT DETECTED NOT DETECTED Final   Staphylococcus aureus NOT DETECTED NOT DETECTED Final   Streptococcus species NOT DETECTED NOT DETECTED Final   Streptococcus agalactiae NOT DETECTED NOT DETECTED Final   Streptococcus pneumoniae NOT DETECTED  NOT DETECTED Final   Streptococcus pyogenes NOT DETECTED NOT DETECTED Final   Acinetobacter baumannii NOT DETECTED NOT DETECTED Final   Enterobacteriaceae species DETECTED (A) NOT DETECTED Final    Comment: Enterobacteriaceae represent a large family of gram-negative bacteria, not a single organism. CRITICAL RESULT CALLED TO, READ BACK BY AND VERIFIED WITH: E MARTIN PHARMD 2126 12/23/17 A BROWNING    Enterobacter cloacae complex NOT DETECTED NOT DETECTED Final   Escherichia coli DETECTED (A) NOT DETECTED Final    Comment: CRITICAL RESULT CALLED TO, READ BACK BY AND VERIFIED WITH: E MARTIN PHARMD 2126 12/23/17 A BROWNING    Klebsiella oxytoca NOT DETECTED NOT DETECTED Final   Klebsiella pneumoniae NOT DETECTED NOT DETECTED Final   Proteus species NOT DETECTED NOT DETECTED Final   Serratia marcescens NOT DETECTED NOT DETECTED Final   Carbapenem resistance NOT DETECTED NOT DETECTED Final   Haemophilus influenzae NOT DETECTED NOT DETECTED Final   Neisseria meningitidis NOT DETECTED NOT DETECTED Final   Pseudomonas aeruginosa NOT DETECTED NOT DETECTED Final   Candida albicans NOT DETECTED NOT DETECTED Final   Candida glabrata NOT DETECTED NOT DETECTED Final   Candida krusei NOT DETECTED NOT DETECTED Final   Candida parapsilosis NOT DETECTED NOT  DETECTED Final   Candida tropicalis NOT DETECTED NOT DETECTED Final    Comment: Performed at [facility], [place] [address] [place], [place] [address]  Blood Culture (routine x 2)     Status: None (Preliminary result)   Collection Time: 12/23/17  8:30 AM  Result Value Ref Range Status   Specimen Description BLOOD LEFT HAND  Final   Special Requests   Final    BOTTLES DRAWN AEROBIC AND ANAEROBIC Blood Culture results may not be optimal due to an inadequate volume of blood received in culture bottles   Culture  Setup Time   Final    GRAM NEGATIVE RODS ANAEROBIC BOTTLE ONLY CRITICAL VALUE NOTED.  VALUE IS CONSISTENT WITH PREVIOUSLY REPORTED AND CALLED VALUE. Performed at [facility], [place] [address] [place], [place] [address]    Culture GRAM NEGATIVE RODS  Final   Report Status PENDING  Incomplete    Radiology Reports Dg Chest Port 1 View  Result Date: 12/23/2017 CLINICAL DATA:  Sepsis, UTI symptoms, hematuria, history stroke, pacemaker/SVT, hypertension, former smoker EXAM: PORTABLE CHEST 1 VIEW COMPARISON:  Portable exam 0816 hours compared to 10/29/2017 FINDINGS: LEFT subclavian transvenous pacemaker with leads projecting over RIGHT atrium, RIGHT ventricle, and coronary sinus. Enlargement of cardiac silhouette with pulmonary vascular congestion. Atherosclerotic calcification aorta. Hazy interstitial infiltrate bilaterally likely pulmonary edema. No segmental consolidation, pleural effusion or pneumothorax. Bones demineralized. Reverse RIGHT shoulder arthroplasty noted. IMPRESSION: Enlargement of cardiac silhouette with vascular congestion and probable mild pulmonary edema. Electronically Signed   By: Lavonia Dana M.D.   On: 12/23/2017 08:25    Time Spent in minutes  30 critical care   Jani Gravel M.D on 12/23/2017 at 9:35 PM  Between 7am to 7pm - Pager - [phone]    After 7pm go to [email or web address] - password [facility]  [facility] -  Office  [phone]

## 2017-12-23 NOTE — ED Notes (Signed)
Pt placed on 2L  for comfort.

## 2017-12-23 NOTE — Progress Notes (Signed)
Notified Bodenhemir, NP that pt had blood through foley in ED. Urine through foley is now tea colored slightly red. NP stated to give Eliquis as ordered tonight. Notified Bodenhemir, NP that pt's QTC on EKG done at 1757 was 528 msec. NP stated to hold Tikosyn and he would order Magnesium level in the am. Will continue to monitor pt. Ranelle Oyster, RN

## 2017-12-23 NOTE — Code Documentation (Addendum)
  Patient Name: Christopher Baker   MRN: [id number]   Date of Birth/ Sex: 02/10/1934 , male      Admission Date: 12/23/2017  Attending Provider: Caren Griffins, MD  Primary Diagnosis: <principal problem not specified>   Indication: Pt was in his usual state of health until this PM, when he was noted to be unresponsive and pulseless. Code blue was subsequently called. At the time of arrival on scene, ACLS protocol was underway.   Technical Description:  - CPR performance duration:  <2 minutes  - Was defibrillation or cardioversion used? Yes   - Was external pacer placed? No  - Was patient intubated pre/post CPR? No   Medications Administered: Y = Yes; Blank = No Amiodarone    Atropine    Calcium    Epinephrine    Lidocaine    Magnesium    Norepinephrine    Phenylephrine    Sodium bicarbonate    Vasopressin     Post CPR evaluation:  - Final Status - Was patient successfully resuscitated ? Yes - What is current rhythm? paced - What is current hemodynamic status? stable  Miscellaneous Information:  - Labs sent, including: CBC, BMP, Mag, lactic acid, troponin  - Primary team notified?  Yes  - Family Notified? Yes  - Additional notes/ transfer status:  remains in stepdown     Asencion Noble, MD  12/23/2017, 9:39 PM

## 2017-12-23 NOTE — Progress Notes (Signed)
Responded to code blue, Patient with 2 functional/working PIVs. Advised RN to page [place] stat should there be a need for another access.

## 2017-12-23 NOTE — Consult Note (Addendum)
Initial Pulmonary/Critical Care Consultation  Patient Name: Christopher Baker MRN: [id number] DOB: August 30, 1933    ADMISSION DATE:  12/23/2017 CONSULTATION DATE:  12/23/2017  REFERRING MD:  Post-CPR resuscitation  REASON FOR CONSULTATION:  Pulseless ventricular tachycardia   HISTORY OF PRESENT ILLNESS  This 84 y.o. Caucasian male is seen in consultation at the request of Dr. Terance Ice for recommendations on further evaluation and management of post-CPR-resuscitation. The patient was found to be in pulseless ventricular tachycardia about an hour ago. CODE BLUE called and ROSC was achieded after 1 attempt at defibrillation. The patient has an implanted pacemaker with a history of atrial fibrillation but does not have an ICD. At the time of clinical encoutner, the patient has been transferred to [facility] after successful ROSC. At the time of clinical interview, the patient is on a nonrebreather mask but denies increase in dyspnea or othopnea. He endorses musculoskeletal chest pain, attributable to CPR efforts just minutes ago.  The patient has mild amnesia relative to his loss of pulse tonight but is otherwise able to provide a fairly detailed history, although he readily admits that he depends on his wife to remember all of his medications.  He was initially hospitalized earlier today with complaints of hematuria. The patient states that he "knew [he had] an infection" as this has been a recurring problem ever since he was diagnosed with prostate cancer "17 years ago". He reports that he did feel febrile and had chills. He endorses urgency, frequency and dysuria. He has already been started on Rocephin 2 g IV daily, and blood cultures have isolated E coli.  REVIEW OF SYSTEMS Constitutional: No weight loss. No night sweats. No fever. No chills. No fatigue. HEENT: No headaches, dysphagia, sore throat, otalgia, nasal congestion, PND CV:  Musculoskeletal/reproducible chest pain. No orthopnea, PND, swelling in  lower extremities, palpitations GI:  No abdominal pain, nausea, vomiting, diarrhea, change in bowel pattern, anorexia Resp: stable exertional dyspnea. No rest dyspnea, cough, mucus, hemoptysis, wheezing  GU: Dysuria, gross hematuria, urgency.  No flank pain. MS:  No joint pain or swelling. No myalgias,  No decreased range of motion.  Psych:  No change in mood or affect. Documented history of dementia, perhaps mild cognitive impairment. Skin: no rash or lesions.   PAST MEDICAL/SURGICAL/SOCIAL/FAMILY HISTORIES   Past Medical History:  Diagnosis Date  . Anxiety   . Arthritis    "hands, right hip; lower back" (02/28/2017)  . Arthritis with psoriasis ([place])   . Borderline diabetes    DIET CONTROLLED - PT STATES HE IS NOT DIABETIC  . Carotid stenosis, bilateral PER DR EARLY / DUPLEX  09-09-10     40 - 59%   BILATERALLY--- ASYMPTOMATIC  . Chronic lower back pain   . Depression   . Dysrhythmia    a-fib  . GERD (gastroesophageal reflux disease)    CONTROLLED W/ PROTONIX  . History of gout   . HTN (hypertension)   . Hyperlipidemia   . Iron deficiency   . Lumbar spondylosis W/ RADICULOPATHY  . OSA on CPAP   . PAF (paroxysmal atrial fibrillation) ([place])   . Presence of permanent cardiac pacemaker DDD-  06-04-10-   DDD; SECONDARY TO SYNCOPY AND BRADYCARDIA  . Prostate cancer ([place]) 2008   S/P 69 RADIATION  [place]    . Restless leg syndrome   . Stroke [facility]) Oct. 14, 2014   light left hand, balance issues, short term memory loss 2014  . SVT (supraventricular tachycardia) (H[facility])    S/P  ABLATION   2008    Past Surgical History:  Procedure Laterality Date  . BACK SURGERY    . BIV UPGRADE N/A 09/07/2017   Procedure: BIV PACEMAKER UPGRADE;  Surgeon: Evans Lance, MD;  Location: [place] CV LAB;  Service: Cardiovascular;  Laterality: N/A;  . BREAST SURGERY     "removed tumor; don't remember which side; years ago" (02/28/2017)  . CARDIAC ELECTROPHYSIOLOGY STUDY AND ABLATION  2008   FOR SVT    . CARDIAC PACEMAKER PLACEMENT  06-04-10   DDD  . CARDIOVERSION N/A 03/02/2017   Procedure: CARDIOVERSION;  Surgeon: Josue Hector, MD;  Location: [facility] ENDOSCOPY;  Service: Cardiovascular;  Laterality: N/A;  . CATARACT EXTRACTION W/ INTRAOCULAR LENS  IMPLANT, BILATERAL Bilateral   . CHOLECYSTECTOMY  2009  . ESOPHAGOGASTRODUODENOSCOPY N/A 12/12/2014   Procedure: ESOPHAGOGASTRODUODENOSCOPY (EGD);  Surgeon: Wilford Corner, MD;  Location: [facility] ENDOSCOPY;  Service: Endoscopy;  Laterality: N/A;  . INCISION AND DRAINAGE Right 1997   "knee replacement got infected"  . INGUINAL HERNIA REPAIR Left 06/2003    DONE WITH PENILE PROSTESIS SURG.  . INGUINAL HERNIA REPAIR Right 1962  . JOINT REPLACEMENT    . KNEE ARTHROSCOPY  06/02/2011   Procedure: ARTHROSCOPY KNEE;  Surgeon: Gearlean Alf;  Location: [place];  Service: Orthopedics;  Laterality: Left;  LEFT KNEE ARTHROSCOPY WITH DEBRIDEMENT  . LOOP RECORDER PLACEMENT  01-30-10  . LOOP RECORDER REMOVAL  06/04/2010  . LUMBAR LAMINECTOMY/ DISKECTOMY/ FUSION  05-19-10   L4 - 5  . PENILE PROSTHESIS IMPLANT  06/2003   AND LEFT CORPOROPLASTY /    DONE INGUINAL REPAIR  . PROSTATE BIOPSY  2007  . REVISION TOTAL KNEE ARTHROPLASTY Right 1997  . TOTAL KNEE ARTHROPLASTY  12/20/2011   Procedure: TOTAL KNEE ARTHROPLASTY;  Surgeon: Gearlean Alf, MD;  Location: [facility];  Service: Orthopedics;  Laterality: Left;  . TOTAL KNEE ARTHROPLASTY Right 1997  . TOTAL SHOULDER ARTHROPLASTY Right 06/27/2015   Procedure: REVERSE TOTAL SHOULDER ARTHROPLASTY;  Surgeon: Netta Cedars, MD;  Location: [place];  Service: Orthopedics;  Laterality: Right;  . TRANSURETHRAL RESECTION OF BLADDER TUMOR WITH GYRUS (TURBT-GYRUS)  ?2018  . TRANSURETHRAL RESECTION OF PROSTATE  2006    Social History   Tobacco Use  . Smoking status: Former Smoker    Packs/day: 1.00    Years: 45.00    Pack years: 45.00    Types: Cigarettes    Last attempt to quit: 12/07/1998    Years since  quitting: 19.0  . Smokeless tobacco: Never Used  Substance Use Topics  . Alcohol use: Yes    Comment: 1 cocktail per evening    Family History  Problem Relation Age of Onset  . Stroke Mother   . Early death Father      Allergies  Allergen Reactions  . Ciprofloxacin Nausea Only and Other (See Comments)    Stomach ache  . Hydrocodone-Acetaminophen Other (See Comments)    Hallucinations in higher doses  . Other Other (See Comments)    Rash from neoprene wrap after knee surgery  . Shellfish Allergy Nausea And Vomiting and Other (See Comments)    Reaction to scallops  . Tramadol Other (See Comments)    Pt becomes combative per wife     Prior to Admission medications   Medication Sig Start Date End Date Taking? Authorizing Provider  acetaminophen (TYLENOL) 500 MG tablet Take 1,000 mg by mouth 2 (two) times daily.   Yes [provider]  amoxicillin (  AMOXIL) 500 MG capsule Take 2,000 mg by mouth See admin instructions. Take 2000 mg by mouth one hour prior to dental apointments 09/02/16  Yes [provider]  Artificial Tear Ointment (LUBRICANT EYE OP) Place 2 drops into both eyes daily as needed (for dry eyes).   Yes [provider]  aspirin 325 MG tablet Take 325 mg by mouth once.   Yes [provider]  atorvastatin (LIPITOR) 20 MG tablet Take 20 mg by mouth daily.   Yes [provider]  cyanocobalamin (,VITAMIN B-12,) 1000 MCG/ML injection Inject 1,000 mcg into the muscle every 30 (thirty) days.    Yes [provider]  dofetilide (TIKOSYN) 500 MCG capsule Take 1 capsule (500 mcg total) by mouth 2 (two) times daily. Patient taking differently: Take 500 mcg by mouth every 12 (twelve) hours. Takes between 10:00 and 11:00 in the AM & PM 10/27/17  Yes Evans Lance, MD  donepezil (ARICEPT) 10 MG tablet TAKE ONE TABLET BY MOUTH EVERY NIGHT AT BEDTIME 10/27/17  Yes Jaffe, Adam R, DO  DULoxetine (CYMBALTA) 30 MG capsule Take 1 capsule (30 mg  total) by mouth daily. Patient taking differently: Take 30 mg by mouth at bedtime.  03/04/17  Yes Baldwin Jamaica, PA-C  ELIQUIS 5 MG TABS tablet TAKE 1 TABLET (5 MG TOTAL) BY MOUTH 2 (TWO) TIMES DAILY. 06/27/17  Yes Evans Lance, MD  ezetimibe (ZETIA) 10 MG tablet Take 1 tablet (10 mg total) by mouth daily. 03/28/15  Yes Dutch Quint B, FNP  ferrous sulfate 325 (65 FE) MG tablet Take 325 mg by mouth 2 (two) times daily with a meal.    Yes [provider]  folic acid (FOLVITE) 1 MG tablet Take 1 mg by mouth at bedtime.    Yes [provider]  furosemide (LASIX) 40 MG tablet Take 80 mg by mouth daily.  02/11/17  Yes [provider]  lisinopril (PRINIVIL,ZESTRIL) 5 MG tablet TAKE ONE TABLET BY MOUTH DAILY Patient taking differently: TAKE ONE TABLET BY MOUTH in the evening 11/28/17  Yes Evans Lance, MD  loperamide (IMODIUM A-D) 2 MG tablet Take 2 mg by mouth 3 (three) times daily as needed for diarrhea or loose stools.    Yes [provider]  methotrexate (RHEUMATREX) 2.5 MG tablet Take 15 mg by mouth every Monday.  10/02/11  Yes [provider]  metoprolol succinate (TOPROL XL) 25 MG 24 hr tablet Take 1 tablet (25 mg total) by mouth daily. Patient taking differently: Take 25 mg by mouth at bedtime.  06/20/17  Yes Evans Lance, MD  oxyCODONE-acetaminophen (PERCOCET) 10-325 MG tablet Take 0.25-0.5 tablets by mouth 2 (two) times daily as needed for pain.  07/28/17  Yes [provider]  pantoprazole (PROTONIX) 40 MG tablet Take 1 tablet (40 mg total) by mouth daily. Patient taking differently: Take 40 mg by mouth at bedtime.  10/22/15  Yes Dutch Quint B, FNP  predniSONE (DELTASONE) 5 MG tablet Take 5 mg by mouth daily.   Yes [provider]  tamsulosin (FLOMAX) 0.4 MG CAPS capsule Take 0.4 mg by mouth daily. 10/18/17  Yes [provider]  Vitamin D, Ergocalciferol, (DRISDOL) 50000 units CAPS capsule Take 50,000 Units by mouth  every Sunday.    Yes [provider]  DULoxetine (CYMBALTA) 30 MG capsule Take 1 capsule (30 mg total) by mouth daily. Patient not taking: Reported on 12/23/2017 12/14/17   Pieter Partridge, DO    Current Facility-Administered  Medications  Medication Dose Route Frequency Provider Last Rate Last Dose  . 0.9 %  sodium chloride infusion   Intravenous Continuous Caren Griffins, MD   Stopped at 12/23/17 2120  . acetaminophen (TYLENOL) tablet 650 mg  650 mg Oral Q6H PRN Caren Griffins, MD       Or  . acetaminophen (TYLENOL) suppository 650 mg  650 mg Rectal Q6H PRN Caren Griffins, MD      . apixaban (ELIQUIS) tablet 5 mg  5 mg Oral BID Marzetta Board M, MD      . atorvastatin (LIPITOR) tablet 20 mg  20 mg Oral Daily Caren Griffins, MD   20 mg at 12/23/17 1828  . [START ON 12/24/2017] cefTRIAXone (ROCEPHIN) 2 g in sodium chloride 0.9 % 100 mL IVPB  2 g Intravenous Q24H Rolla Flatten, [facility]      . dofetilide (TIKOSYN) capsule 500 mcg  500 mcg Oral Q12H Gherghe, Costin M, MD      . donepezil (ARICEPT) tablet 10 mg  10 mg Oral QHS Gherghe, Costin M, MD      . DULoxetine (CYMBALTA) DR capsule 30 mg  30 mg Oral QHS Gherghe, Costin M, MD      . ferrous sulfate tablet 325 mg  325 mg Oral BID WC Caren Griffins, MD   325 mg at 12/23/17 1828  . folic acid (FOLVITE) tablet 1 mg  1 mg Oral QHS Gherghe, Costin M, MD      . hydrocortisone sodium succinate (SOLU-CORTEF) 100 MG injection 50 mg  50 mg Intravenous Q6H Caren Griffins, MD   50 mg at 12/23/17 1828  . [START ON 12/24/2017] MEDLINE mouth rinse  15 mL Mouth Rinse BID Caren Griffins, MD      . Derrill Memo ON 12/24/2017] metoprolol succinate (TOPROL-XL) 24 hr tablet 25 mg  25 mg Oral QHS Gherghe, Costin M, MD      . oxyCODONE-acetaminophen (PERCOCET/ROXICET) 5-325 MG per tablet 0.5-1 tablet  0.5-1 tablet Oral BID PRN Caren Griffins, MD      . pantoprazole (PROTONIX) EC tablet 40 mg  40 mg Oral QHS Gherghe, Costin M, MD         VITAL  SIGNS: BP (!) 131/100   Pulse 68   Temp 98.3 F (36.8 C) (Oral)   Resp (!) 25   Ht 6\' 3"  (1.905 m)   Wt 98.5 kg (217 lb 2.5 oz)   SpO2 93%   BMI 27.14 kg/m   INTAKE / OUTPUT: I/O last 3 completed shifts: In: 1518.3 [IV Piggyback:1518.3] Out: 75 [Stool:75]  PHYSICAL EXAMINATION: GENERAL: alert, oriented to person, place, and time. Pleasant. Well-developed. Cooperative. No acute distress. HEAD: normocephalic, atraumatic EYE: PERRLA, EOM intact, no scleral icterus, no pallor. NOSE: nares are patent. No polyps. No exudate. No sinus tenderness. THROAT/ORAL CAVITY: Normal dentition. No oral thrush. No exudate. Mucous membranes are moist. No tonsillar enlargement. NECK: supple, no thyromegaly, no JVD, no lymphadenopathy. Trachea midline. CHEST/LUNG: symmetric in development and expansion. Dinished air entry bilaterally. Diffuse bilateral crackles. Scattered wheezes. HEART: Regular S1 and S2 without murmur, rub or gallop. ABDOMEN: soft, nontender, nondistended. Normoactive bowel sounds. No rebound. No guarding. No hepatosplenomegaly. EXTREMITIES: Edema: 1+. No cyanosis. No clubbing. 2+ DP pulses LYMPHATIC: no cervical/axiallary/inguinal lymph nodes appreciated MUSCULOSKELETAL: No point tenderness. No bulk atrophy. Joints: Normal. SKIN:  No rash or lesion. NEUROLOGIC: Cranial nerves II-XII are grossly symmetric and physiologic. Babinski absent. No sensory deficit. Motor: 5/5 @ RUE, 5/5 @  LUE, 5/5 @ RLL,  5/5 @ LLL.  DTR: 2+ @ R biceps, 2+ @ L biceps, 2+ @ R patellar,  2+ @ L patellar. No cerebellar signs. Gait was not assessed.   LABS:  ABG Recent Labs  Lab 12/23/17 1054  PHART 7.440  PCO2ART 29.4*  PO2ART 408.1   BASIC METABOLIC PROFILE Recent Labs  Lab 12/23/17 0813 12/23/17 0845  NA 142 142  K 3.7 3.7  CL 109 107  CO2 22  --   BUN 26* 26*  CREATININE 1.37* 1.10  GLUCOSE 127* 124*  CALCIUM 9.4  --    Liver Enzymes Recent Labs  Lab 12/23/17 0813  AST 31  ALT 28    ALKPHOS 85  BILITOT 0.8  ALBUMIN 3.1*   CBC Recent Labs  Lab 12/23/17 0813 12/23/17 0845  WBC 4.3  --   HGB 12.1* 12.2*  HCT 39.6 36.0*  PLT 235  --    SEPSIS MARKERS Recent Labs  Lab 12/23/17 0841 12/23/17 1052 12/23/17 1204  LATICACIDVEN 4.20* 2.32* 2.6*   CULTURES: Results for orders placed or performed during the hospital encounter of 12/23/17  Blood Culture (routine x 2)     Status: None (Preliminary result)   Collection Time: 12/23/17  8:13 AM  Result Value Ref Range Status   Specimen Description BLOOD RIGHT ANTECUBITAL  Final   Special Requests   Final    BOTTLES DRAWN AEROBIC AND ANAEROBIC Blood Culture adequate volume   Culture  Setup Time   Final    GRAM NEGATIVE RODS ANAEROBIC BOTTLE ONLY Organism ID to follow CRITICAL RESULT CALLED TO, READ BACK BY AND VERIFIED WITHFerne Coe [facility] 2126 12/23/17 A BROWNING Performed at [facility], [place] [address], [place], [place] [address]    Culture PENDING  Incomplete   Report Status PENDING  Incomplete  Blood Culture ID Panel (Reflexed)     Status: Abnormal   Collection Time: 12/23/17  8:13 AM  Result Value Ref Range Status   Enterococcus species NOT DETECTED NOT DETECTED Final   Listeria monocytogenes NOT DETECTED NOT DETECTED Final   Staphylococcus species NOT DETECTED NOT DETECTED Final   Staphylococcus aureus NOT DETECTED NOT DETECTED Final   Streptococcus species NOT DETECTED NOT DETECTED Final   Streptococcus agalactiae NOT DETECTED NOT DETECTED Final   Streptococcus pneumoniae NOT DETECTED NOT DETECTED Final   Streptococcus pyogenes NOT DETECTED NOT DETECTED Final   Acinetobacter baumannii NOT DETECTED NOT DETECTED Final   Enterobacteriaceae species DETECTED (A) NOT DETECTED Final    Comment: Enterobacteriaceae represent a large family of gram-negative bacteria, not a single organism. CRITICAL RESULT CALLED TO, READ BACK BY AND VERIFIED WITH: E MARTIN PHARMD 2126 12/23/17 A BROWNING    Enterobacter  cloacae complex NOT DETECTED NOT DETECTED Final   Escherichia coli DETECTED (A) NOT DETECTED Final    Comment: CRITICAL RESULT CALLED TO, READ BACK BY AND VERIFIED WITH: E MARTIN PHARMD 2126 12/23/17 A BROWNING    Klebsiella oxytoca NOT DETECTED NOT DETECTED Final   Klebsiella pneumoniae NOT DETECTED NOT DETECTED Final   Proteus species NOT DETECTED NOT DETECTED Final   Serratia marcescens NOT DETECTED NOT DETECTED Final   Carbapenem resistance NOT DETECTED NOT DETECTED Final   Haemophilus influenzae NOT DETECTED NOT DETECTED Final   Neisseria meningitidis NOT DETECTED NOT DETECTED Final   Pseudomonas aeruginosa NOT DETECTED NOT DETECTED Final   Candida albicans NOT DETECTED NOT DETECTED Final   Candida glabrata NOT DETECTED NOT DETECTED Final  Candida krusei NOT DETECTED NOT DETECTED Final   Candida parapsilosis NOT DETECTED NOT DETECTED Final   Candida tropicalis NOT DETECTED NOT DETECTED Final    Comment: Performed at [facility], [place] [address], [place], [place] [address]  Blood Culture (routine x 2)     Status: None (Preliminary result)   Collection Time: 12/23/17  8:30 AM  Result Value Ref Range Status   Specimen Description BLOOD LEFT HAND  Final   Special Requests   Final    BOTTLES DRAWN AEROBIC AND ANAEROBIC Blood Culture results may not be optimal due to an inadequate volume of blood received in culture bottles   Culture  Setup Time   Final    GRAM NEGATIVE RODS ANAEROBIC BOTTLE ONLY CRITICAL VALUE NOTED.  VALUE IS CONSISTENT WITH PREVIOUSLY REPORTED AND CALLED VALUE. Performed at [facility], [place] [address], [place], [place] [address]    Culture GRAM NEGATIVE RODS  Final   Report Status PENDING  Incomplete     IMAGING: Dg Chest Port 1 View  Result Date: 12/23/2017 CLINICAL DATA:  Fluid overload EXAM: PORTABLE CHEST 1 VIEW COMPARISON:  12/23/2017 FINDINGS: Cardiac pacemaker. Cardiac enlargement with pulmonary vascular congestion. Since the prior study,  there is increasing airspace and interstitial infiltration in the lungs suggesting progression of pulmonary edema. No pleural effusions. No pneumothorax. Mediastinal contours appear intact. Calcification of the aorta. Degenerative changes in the spine and left shoulder. Postoperative change in the right shoulder. IMPRESSION: Cardiac enlargement with pulmonary vascular congestion. Increasing pulmonary edema since previous study. Electronically Signed   By: Lucienne Capers M.D.   On: 12/23/2017 22:01   Dg Chest Port 1 View  Result Date: 12/23/2017 CLINICAL DATA:  Sepsis, UTI symptoms, hematuria, history stroke, pacemaker/SVT, hypertension, former smoker EXAM: PORTABLE CHEST 1 VIEW COMPARISON:  Portable exam 0816 hours compared to 10/29/2017 FINDINGS: LEFT subclavian transvenous pacemaker with leads projecting over RIGHT atrium, RIGHT ventricle, and coronary sinus. Enlargement of cardiac silhouette with pulmonary vascular congestion. Atherosclerotic calcification aorta. Hazy interstitial infiltrate bilaterally likely pulmonary edema. No segmental consolidation, pleural effusion or pneumothorax. Bones demineralized. Reverse RIGHT shoulder arthroplasty noted. IMPRESSION: Enlargement of cardiac silhouette with vascular congestion and probable mild pulmonary edema. Electronically Signed   By: Lavonia Dana M.D.   On: 12/23/2017 08:25    ANTIBIOTICS: [place] (7/5>>  SIGNIFICANT EVENTS: 7/5: admitted with gross hematuria. Transferred to [facility] after pulseless VT arrest.    ASSESSMENT / PLAN: Principal Problem:   Ventricular tachycardia ([place]) Active Problems:   Sepsis ([place])   E coli bacteremia   Acute pulmonary edema ([facility])   Pressure injury of skin   Urinary tract infection   PULMONARY  ACUTE PULMONARY EDEMA, suspected ABG Titrate supplemental oxygen to keep SpO2 93+%.  CARDIOVASCULAR  LVEF 25%  Atrial fibrillation, chronic on Eliquis  Pulseless VT Consult Cardiology re: cardiomyopathy, VT.  Case discussed with Dr. Lennie Muckle Agree with stopping Tikosyn  GASTROINTESTINAL  GI PROPHYLAXIS: famotidine  HEMATOLOGIC  NORMOCYTIC ANEMIA  Vitamin B12 deficiency  DVT PROPHYLAXIS: Eliquis Monitor Hgb.  INFECTIOUS  E coli SEPSIS  URINARY TRACT INFECTION Continue Rocephin.  NEUROLOGIC  NO ACUTE ISSUES  DOCUMENTATION OF DEMENTIA AT BASELINE, perhaps MILD COGNITIVE IMPAIRMENT  RENAL  Recent diuretic therapy with subsequent pulseless VT Check K, Mg, Cr ABG to assess acid-base status  ENDOCRINE  NO ACUTE ISSUES   FAMILY  - Updates: no family at the bedside.  Critical care time: 45 minutes.  The treatment and management of the  patient's condition was required based on the threat of imminent deterioration. This time reflects time spent by the physician evaluating, providing care and managing the critically ill patient's care. The time was spent at the immediate bedside (or on the same floor/unit and dedicated to this patient's care). Time involved in separately billable procedures is NOT included int he critical care time indicated above. Family meeting and update time may be included above if and only if the patient is unable/incompetent to participate in clinical interview and/or decision making, and the discussion was necessary to determining treatment decisions.  Renee Pain, MD Board Certified by the ABIM, [place] Pager: [phone]  12/23/2017, 10:27 PM

## 2017-12-23 NOTE — ED Notes (Addendum)
MD [facility] notified about pt being in Afib with HR going as high as 190.  Per MD Belfi, monitor HR and give rectal tylenol as well as IV fluids to see if HR improves.  MD Belfi also advised to give 3L of fluids slowly over a couple hours due to pt having pulmonary edema.  Per MD Belfi, 500 ml of Vancomycin to count towards fluid goal.

## 2017-12-23 NOTE — Progress Notes (Signed)
Chaplain was called to page for Code Blue.  Family ws present including wife and 2 long time friends.  Wife needs grace and in her prayer was seeking to ask God to preserve her husband.  Wife is very assertive.  Pt wife was advised to call children that live 6 and 2 hours away respectively.  The wife was happy to see husband revived and talking again

## 2017-12-24 ENCOUNTER — Inpatient Hospital Stay (HOSPITAL_COMMUNITY): Payer: PPO

## 2017-12-24 DIAGNOSIS — I5043 Acute on chronic combined systolic (congestive) and diastolic (congestive) heart failure: Secondary | ICD-10-CM

## 2017-12-24 DIAGNOSIS — T50905A Adverse effect of unspecified drugs, medicaments and biological substances, initial encounter: Secondary | ICD-10-CM

## 2017-12-24 DIAGNOSIS — R9431 Abnormal electrocardiogram [ECG] [EKG]: Secondary | ICD-10-CM

## 2017-12-24 DIAGNOSIS — I472 Ventricular tachycardia: Secondary | ICD-10-CM

## 2017-12-24 DIAGNOSIS — J810 Acute pulmonary edema: Secondary | ICD-10-CM

## 2017-12-24 DIAGNOSIS — A4151 Sepsis due to Escherichia coli [E. coli]: Principal | ICD-10-CM

## 2017-12-24 DIAGNOSIS — R600 Localized edema: Secondary | ICD-10-CM

## 2017-12-24 LAB — COMPREHENSIVE METABOLIC PANEL
ALT: 125 U/L — ABNORMAL HIGH (ref 0–44)
AST: 145 U/L — AB (ref 15–41)
Albumin: 2.5 g/dL — ABNORMAL LOW (ref 3.5–5.0)
Alkaline Phosphatase: 90 U/L (ref 38–126)
Anion gap: 12 (ref 5–15)
BILIRUBIN TOTAL: 0.8 mg/dL (ref 0.3–1.2)
BUN: 31 mg/dL — AB (ref 8–23)
CHLORIDE: 112 mmol/L — AB (ref 98–111)
CO2: 19 mmol/L — ABNORMAL LOW (ref 22–32)
Calcium: 8.9 mg/dL (ref 8.9–10.3)
Creatinine, Ser: 1.61 mg/dL — ABNORMAL HIGH (ref 0.61–1.24)
GFR calc Af Amer: 44 mL/min — ABNORMAL LOW (ref >60)
GFR, EST NON AFRICAN AMERICAN: 38 mL/min — AB (ref >60)
Glucose, Bld: 163 mg/dL — ABNORMAL HIGH (ref 70–99)
POTASSIUM: 4.4 mmol/L (ref 3.5–5.1)
Sodium: 143 mmol/L (ref 135–145)
TOTAL PROTEIN: 5.4 g/dL — AB (ref 6.5–8.1)

## 2017-12-24 LAB — BASIC METABOLIC PANEL
ANION GAP: 15 (ref 5–15)
ANION GAP: 9 (ref 5–15)
BUN: 33 mg/dL — AB (ref 8–23)
BUN: 37 mg/dL — ABNORMAL HIGH (ref 8–23)
CHLORIDE: 110 mmol/L (ref 98–111)
CO2: 20 mmol/L — AB (ref 22–32)
CO2: 21 mmol/L — AB (ref 22–32)
Calcium: 9.2 mg/dL (ref 8.9–10.3)
Calcium: 8.9 mg/dL (ref 8.9–10.3)
Chloride: 111 mmol/L (ref 98–111)
Creatinine, Ser: 1.53 mg/dL — ABNORMAL HIGH (ref 0.61–1.24)
Creatinine, Ser: 1.69 mg/dL — ABNORMAL HIGH (ref 0.61–1.24)
GFR calc Af Amer: 47 mL/min — ABNORMAL LOW (ref >60)
GFR calc non Af Amer: 36 mL/min — ABNORMAL LOW (ref >60)
GFR, EST AFRICAN AMERICAN: 42 mL/min — AB (ref >60)
GFR, EST NON AFRICAN AMERICAN: 41 mL/min — AB (ref >60)
GLUCOSE: 133 mg/dL — AB (ref 70–99)
GLUCOSE: 173 mg/dL — AB (ref 70–99)
POTASSIUM: 3.8 mmol/L (ref 3.5–5.1)
Potassium: 3.8 mmol/L (ref 3.5–5.1)
Sodium: 145 mmol/L (ref 135–145)
Sodium: 141 mmol/L (ref 135–145)

## 2017-12-24 LAB — CBC
HEMATOCRIT: 34.1 % — AB (ref 39.0–52.0)
Hemoglobin: 10.5 g/dL — ABNORMAL LOW (ref 13.0–17.0)
MCH: 28.2 pg (ref 26.0–34.0)
MCHC: 30.8 g/dL (ref 30.0–36.0)
MCV: 91.7 fL (ref 78.0–100.0)
PLATELETS: 193 10*3/uL (ref 150–400)
RBC: 3.72 MIL/uL — AB (ref 4.22–5.81)
RDW: 16.4 % — AB (ref 11.5–15.5)
WBC: 28.5 10*3/uL — ABNORMAL HIGH (ref 4.0–10.5)

## 2017-12-24 LAB — GLUCOSE, CAPILLARY
GLUCOSE-CAPILLARY: 148 mg/dL — AB (ref 70–99)
GLUCOSE-CAPILLARY: 137 mg/dL — AB (ref 70–99)
GLUCOSE-CAPILLARY: 152 mg/dL — AB (ref 70–99)
Glucose-Capillary: 147 mg/dL — ABNORMAL HIGH (ref 70–99)
Glucose-Capillary: 134 mg/dL — ABNORMAL HIGH (ref 70–99)

## 2017-12-24 LAB — PROCALCITONIN
PROCALCITONIN: 44.22 ng/mL
PROCALCITONIN: 44.53 ng/mL

## 2017-12-24 LAB — TROPONIN I
TROPONIN I: 1.18 ng/mL — AB (ref <0.03)
Troponin I: 1.55 ng/mL (ref <0.03)

## 2017-12-24 LAB — LACTIC ACID, PLASMA: LACTIC ACID, VENOUS: 4 mmol/L — AB (ref 0.5–1.9)

## 2017-12-24 LAB — MAGNESIUM: MAGNESIUM: 2 mg/dL (ref 1.7–2.4)

## 2017-12-24 LAB — TSH: TSH: 1.724 u[IU]/mL (ref 0.350–4.500)

## 2017-12-24 LAB — MRSA PCR SCREENING: MRSA by PCR: NEGATIVE

## 2017-12-24 MED ORDER — LIDOCAINE IN D5W 4-5 MG/ML-% IV SOLN
1.0000 mg/min | INTRAVENOUS | Status: DC
  Administered 2017-12-24: 0.5 mg/min via INTRAVENOUS
  Administered 2017-12-26: 1 mg/min via INTRAVENOUS
  Filled 2017-12-24 (×2): qty 500

## 2017-12-24 MED ORDER — METOPROLOL TARTRATE 5 MG/5ML IV SOLN
5.0000 mg | Freq: Once | INTRAVENOUS | Status: AC
Start: 2017-12-24 — End: 2017-12-24
  Administered 2017-12-24: 5 mg via INTRAVENOUS

## 2017-12-24 MED ORDER — LIDOCAINE BOLUS VIA INFUSION
50.0000 mg | Freq: Once | INTRAVENOUS | Status: AC
  Administered 2017-12-24: 50 mg via INTRAVENOUS
  Filled 2017-12-24: qty 52

## 2017-12-24 MED ORDER — APIXABAN 2.5 MG PO TABS
2.5000 mg | ORAL_TABLET | Freq: Two times a day (BID) | ORAL | Status: DC
  Administered 2017-12-24 – 2017-12-30 (×12): 2.5 mg via ORAL
  Filled 2017-12-24 (×12): qty 1

## 2017-12-24 MED ORDER — METOPROLOL TARTRATE 5 MG/5ML IV SOLN
INTRAVENOUS | Status: AC
  Administered 2017-12-24: 5 mg via INTRAVENOUS
  Filled 2017-12-24: qty 5

## 2017-12-24 MED ORDER — FUROSEMIDE 10 MG/ML IJ SOLN
60.0000 mg | Freq: Four times a day (QID) | INTRAMUSCULAR | Status: AC
  Administered 2017-12-24 (×2): 60 mg via INTRAVENOUS
  Filled 2017-12-24 (×2): qty 6

## 2017-12-24 MED FILL — Medication: Qty: 1 | Status: AC

## 2017-12-24 NOTE — Consult Note (Signed)
ELECTROPHYSIOLOGY CONSULT NOTE  Patient ID: Christopher Baker, MRN: [id number], DOB/AGE: [birth year] 84 y.o. Admit date: 12/23/2017 Date of Consult: 12/24/2017  Primary Physician: Haywood Pao, MD Primary Cardiologist: GT GREYSYN VANDERBERG is a 84 y.o. male who is being seen today for the evaluation of Torsdas at the request of Dr Lake Bells.   Chief Complaint: Cardiac Arrest   HPI Christopher Baker is a 84 y.o. male admitted early yesterday a.m. with recurrent (April 2019) urosepsis (temp 104, hypotension) treated aggressively with the sepsis protocol and later yesterday evening developed nonsustained and then sustained polymorphic ventricular tachycardia, long-short initiated consistent with torsade de pointes.  Defibrillated.  He has a history of nonischemic cardiomyopathy and paroxysmal atrial fibrillation.  He has been treated with dofetilide appropriately dosed for his normal renal function.  Interval worsening in LVEF from 50% (4/16)--20-25% (7/18) and he underwent biventricular upgrade 3/19.  He had urosepsis with E. coli 4/19.  He has been felt by his urologist at his propensity towards infection is aggravated by his of anticoagulation and bleeding of his bladder mucosa (my understanding from the family)  As noted above, the patient presented initial QT interval was under 500 ms.  Potassium was 3.7 on arrival.  His magnesium in late May had also been normal.  Antibiotics were vancomycin and Zosyn.  No other QT prolonging drugs  Prior history of syncope or for subsequent  Past Medical History:  Diagnosis Date  . Anxiety   . Arthritis    "hands, right hip; lower back" (02/28/2017)  . Arthritis with psoriasis ([place])   . Borderline diabetes    DIET CONTROLLED - PT STATES HE IS NOT DIABETIC  . Carotid stenosis, bilateral PER DR EARLY / DUPLEX  09-09-10     40 - 59%   BILATERALLY--- ASYMPTOMATIC  . Chronic lower back pain   . Depression   . Dysrhythmia    a-fib  . GERD  (gastroesophageal reflux disease)    CONTROLLED W/ PROTONIX  . History of gout   . HTN (hypertension)   . Hyperlipidemia   . Iron deficiency   . Lumbar spondylosis W/ RADICULOPATHY  . OSA on CPAP   . PAF (paroxysmal atrial fibrillation) ([place])   . Presence of permanent cardiac pacemaker DDD-  06-04-10-   DDD; SECONDARY TO SYNCOPY AND BRADYCARDIA  . Prostate cancer ([place]) 2008   S/P 6 RADIATION  [place]    . Restless leg syndrome   . Stroke [facility]) Oct. 14, 2014   light left hand, balance issues, short term memory loss 2014  . SVT (supraventricular tachycardia) ([place])    S/P ABLATION   2008      Surgical History:  Past Surgical History:  Procedure Laterality Date  . BACK SURGERY    . BIV UPGRADE N/A 09/07/2017   Procedure: BIV PACEMAKER UPGRADE;  Surgeon: Evans Lance, MD;  Location: [facility];  Service: Cardiovascular;  Laterality: N/A;  . BREAST SURGERY     "removed tumor; don't remember which side; years ago" (02/28/2017)  . CARDIAC ELECTROPHYSIOLOGY STUDY AND ABLATION  2008   FOR SVT  . CARDIAC PACEMAKER PLACEMENT  06-04-10   DDD  . CARDIOVERSION N/A 03/02/2017   Procedure: CARDIOVERSION;  Surgeon: Josue Hector, MD;  Location[facility] ENDOSCOPY;  Service: Cardiovascular;  Laterality: N/A;  . CATARACT EXTRACTION W/ INTRAOCULAR LENS  IMPLANT, BILATERAL Bilateral   . CHOLECYSTECTOMY  2009  . ESOPHAGOGASTRODUODENOSCOPY N/A 12/12/2014   Procedure: ESOPHAGOGASTRODUODENOSCOPY (EGD);  Surgeon:  Wilford Corner, MD;  Location[facility] ENDOSCOPY;  Service: Endoscopy;  Laterality: N/A;  . INCISION AND DRAINAGE Right 1997   "knee replacement got infected"  . INGUINAL HERNIA REPAIR Left 06/2003    DONE WITH PENILE PROSTESIS SURG.  . INGUINAL HERNIA REPAIR Right 1962  . JOINT REPLACEMENT    . KNEE ARTHROSCOPY  06/02/2011   Procedure: ARTHROSCOPY KNEE;  Surgeon: Gearlean Alf;  Location: [facility];  Service: Orthopedics;  Laterality: Left;  LEFT KNEE ARTHROSCOPY WITH  DEBRIDEMENT  . LOOP RECORDER PLACEMENT  01-30-10  . LOOP RECORDER REMOVAL  06/04/2010  . LUMBAR LAMINECTOMY/ DISKECTOMY/ FUSION  05-19-10   L4 - 5  . PENILE PROSTHESIS IMPLANT  06/2003   AND LEFT CORPOROPLASTY /    DONE INGUINAL REPAIR  . PROSTATE BIOPSY  2007  . REVISION TOTAL KNEE ARTHROPLASTY Right 1997  . TOTAL KNEE ARTHROPLASTY  12/20/2011   Procedure: TOTAL KNEE ARTHROPLASTY;  Surgeon: Gearlean Alf, MD;  Location: [facility] OR[facility];  Service: Orthopedics;  Laterality: Left;  . TOTAL KNEE ARTHROPLASTY Right 1997  . TOTAL SHOULDER ARTHROPLASTY Right 06/27/2015   Procedure: REVERSE TOTAL SHOULDER ARTHROPLASTY;  Surgeon: Netta Cedars, MD;  Location: [facility];  Service: Orthopedics;  Laterality: Right;  . TRANSURETHRAL RESECTION OF BLADDER TUMOR WITH GYRUS (TURBT-GYRUS)  ?2018  . TRANSURETHRAL RESECTION OF PROSTATE  2006     Home Meds: Prior to Admission medications   Medication Sig Start Date End Date Taking? Authorizing Provider  acetaminophen (TYLENOL) 500 MG tablet Take 1,000 mg by mouth 2 (two) times daily.   Yes [provider]  amoxicillin (AMOXIL) 500 MG capsule Take 2,000 mg by mouth See admin instructions. Take 2000 mg by mouth one hour prior to dental apointments 09/02/16  Yes [provider]  Artificial Tear Ointment (LUBRICANT EYE OP) Place 2 drops into both eyes daily as needed (for dry eyes).   Yes [provider]  aspirin 325 MG tablet Take 325 mg by mouth once.   Yes [provider]  atorvastatin (LIPITOR) 20 MG tablet Take 20 mg by mouth daily.   Yes [provider]  cyanocobalamin (,VITAMIN B-12,) 1000 MCG/ML injection Inject 1,000 mcg into the muscle every 30 (thirty) days.    Yes [provider]  dofetilide (TIKOSYN) 500 MCG capsule Take 1 capsule (500 mcg total) by mouth 2 (two) times daily. Patient taking differently: Take 500 mcg by mouth every 12 (twelve) hours. Takes between 10:00 and 11:00 in the AM & PM 10/27/17  Yes Evans Lance, MD  donepezil (ARICEPT) 10 MG tablet TAKE ONE TABLET BY MOUTH EVERY NIGHT AT BEDTIME 10/27/17  Yes Jaffe, Adam R, DO  DULoxetine (CYMBALTA) 30 MG capsule Take 1 capsule (30 mg total) by mouth daily. Patient taking differently: Take 30 mg by mouth at bedtime.  03/04/17  Yes Baldwin Jamaica, PA-C  ELIQUIS 5 MG TABS tablet TAKE 1 TABLET (5 MG TOTAL) BY MOUTH 2 (TWO) TIMES DAILY. 06/27/17  Yes Evans Lance, MD  ezetimibe (ZETIA) 10 MG tablet Take 1 tablet (10 mg total) by mouth daily. 03/28/15  Yes Dutch Quint B, FNP  ferrous sulfate 325 (65 FE) MG tablet Take 325 mg by mouth 2 (two) times daily with a meal.    Yes [provider]  folic acid (FOLVITE) 1 MG tablet Take 1 mg by mouth at bedtime.    Yes [provider]  furosemide (LASIX) 40 MG tablet Take 80 mg by mouth  daily.  02/11/17  Yes [provider]  lisinopril (PRINIVIL,ZESTRIL) 5 MG tablet TAKE ONE TABLET BY MOUTH DAILY Patient taking differently: TAKE ONE TABLET BY MOUTH in the evening 11/28/17  Yes Evans Lance, MD  loperamide (IMODIUM A-D) 2 MG tablet Take 2 mg by mouth 3 (three) times daily as needed for diarrhea or loose stools.    Yes [provider]  methotrexate (RHEUMATREX) 2.5 MG tablet Take 15 mg by mouth every Monday.  10/02/11  Yes [provider]  metoprolol succinate (TOPROL XL) 25 MG 24 hr tablet Take 1 tablet (25 mg total) by mouth daily. Patient taking differently: Take 25 mg by mouth at bedtime.  06/20/17  Yes Evans Lance, MD  oxyCODONE-acetaminophen (PERCOCET) 10-325 MG tablet Take 0.25-0.5 tablets by mouth 2 (two) times daily as needed for pain.  07/28/17  Yes [provider]  pantoprazole (PROTONIX) 40 MG tablet Take 1 tablet (40 mg total) by mouth daily. Patient taking differently: Take 40 mg by mouth at bedtime.  10/22/15  Yes Dutch Quint B, FNP  predniSONE (DELTASONE) 5 MG tablet Take 5 mg by mouth daily.   Yes [provider]  tamsulosin  (FLOMAX) 0.4 MG CAPS capsule Take 0.4 mg by mouth daily. 10/18/17  Yes [provider]  Vitamin D, Ergocalciferol, (DRISDOL) 50000 units CAPS capsule Take 50,000 Units by mouth every Sunday.    Yes [provider]  DULoxetine (CYMBALTA) 30 MG capsule Take 1 capsule (30 mg total) by mouth daily. Patient not taking: Reported on 12/23/2017 12/14/17   Pieter Partridge, DO    Inpatient Medications:  . apixaban  5 mg Oral BID  . atorvastatin  20 mg Oral Daily  . donepezil  10 mg Oral QHS  . DULoxetine  30 mg Oral QHS  . ferrous sulfate  325 mg Oral BID WC  . folic acid  1 mg Oral QHS  . furosemide  60 mg Intravenous Q6H  . mouth rinse  15 mL Mouth Rinse BID  . metoprolol succinate  25 mg Oral QHS  . pantoprazole  40 mg Oral QHS      Allergies:  Allergies  Allergen Reactions  . Ciprofloxacin Nausea Only and Other (See Comments)    Stomach ache  . Hydrocodone-Acetaminophen Other (See Comments)    Hallucinations in higher doses  . Other Other (See Comments)    Rash from neoprene wrap after knee surgery  . Shellfish Allergy Nausea And Vomiting and Other (See Comments)    Reaction to scallops  . Tramadol Other (See Comments)    Pt becomes combative per wife    Social History   Socioeconomic History  . Marital status: Married    Spouse name: Not on file  . Number of children: Not on file  . Years of education: Not on file  . Highest education level: Not on file  Occupational History  . Occupation: retired  Scientific laboratory technician  . Financial resource strain: Not on file  . Food insecurity:    Worry: Not on file    Inability: Not on file  . Transportation needs:    Medical: Not on file    Non-medical: Not on file  Tobacco Use  . Smoking status: Former Smoker    Packs/day: 1.00    Years: 45.00    Pack years: 45.00    Types: Cigarettes    Last attempt to quit: 12/07/1998    Years since quitting: 19.0  . Smokeless tobacco: Never Used  Substance and Sexual Activity  .  Alcohol use: Yes    Comment: 1 cocktail per evening  . Drug use: No  . Sexual activity: Not Currently    Partners: Female  Lifestyle  . Physical activity:    Days per week: Not on file    Minutes per session: Not on file  . Stress: Not on file  Relationships  . Social connections:    Talks on phone: Not on file    Gets together: Not on file    Attends religious service: Not on file    Active member of club or organization: Not on file    Attends meetings of clubs or organizations: Not on file    Relationship status: Not on file  . Intimate partner violence:    Fear of current or ex partner: Not on file    Emotionally abused: Not on file    Physically abused: Not on file    Forced sexual activity: Not on file  Other Topics Concern  . Not on file  Social History Narrative  . Not on file     Family History  Problem Relation Age of Onset  . Stroke Mother   . Early death Father      ROS:  Please see the history of present illness.     All other systems reviewed and negative.    Physical Exam: Blood pressure (!) 164/93, pulse (!) 109, temperature (!) 97.3 F (36.3 C), temperature source Oral, resp. rate (!) 33, height 6\' 3"  (1.905 m), weight 216 lb 14.9 oz (98.4 kg), SpO2 100 %. General: Well developed, well nourished male in no acute distress. Head: Normocephalic, atraumatic, sclera non-icteric, no xanthomas, nares are without discharge. EENT: normal Lymph Nodes:  none Back: without scoliosis/kyphosis, no CVA tendersness Neck: Negative for carotid bruits. JVD 8  morning lungs: Clear bilaterally to auscultation without wheezes, rales, or rhonchi. Breathing is unlabored. Heart: RRR with S1 S2. 2 /6 systolic murmur , rubs, or gallops appreciated. Abdomen: Soft, non-tender, non-distended with normoactive bowel sounds. No hepatomegaly. No rebound/guarding. No obvious abdominal masses. Msk:  Strength and tone appear normal for age. Extremities: No clubbing or cyanosis. 3+ edem  with involvement of the dorsum of the foot  Distal pedal pulses are 2+ and equal bilaterally. Skin: Warm and Dry Neuro: Alert and oriented X 3. CN III-XII intact Grossly normal sensory and motor function . Psych:  Responds to questions appropriately with a normal affect.      Labs: Cardiac Enzymes Recent Labs    12/23/17 2205 12/24/17 0342 12/24/17 0853  TROPONINI 1.55* 1.55* 1.18*   CBC Lab Results  Component Value Date   WBC 28.5 (H) 12/24/2017   HGB 10.5 (L) 12/24/2017   HCT 34.1 (L) 12/24/2017   MCV 91.7 12/24/2017   PLT 193 12/24/2017   PROTIME: No results for input(s): LABPROT, INR in the last 72 hours. Chemistry  Recent Labs  Lab 12/24/17 0342  NA 143  K 4.4  CL 112*  CO2 19*  BUN 31*  CREATININE 1.61*  CALCIUM 8.9  PROT 5.4*  BILITOT 0.8  ALKPHOS 90  ALT 125*  AST 145*  GLUCOSE 163*   Lipids Lab Results  Component Value Date   CHOL 146 06/19/2014   HDL 55 06/19/2014   LDLCALC 78 06/19/2014   TRIG 64 06/19/2014   BNP No results found for: PROBNP Thyroid Function Tests: Recent Labs    12/23/17 2355  TSH 1.724  Miscellaneous No results found for: DDIMER  Radiology/Studies:  Dg Chest Port 1 View  Result Date: 12/23/2017 CLINICAL DATA:  Fluid overload EXAM: PORTABLE CHEST 1 VIEW COMPARISON:  12/23/2017 FINDINGS: Cardiac pacemaker. Cardiac enlargement with pulmonary vascular congestion. Since the prior study, there is increasing airspace and interstitial infiltration in the lungs suggesting progression of pulmonary edema. No pleural effusions. No pneumothorax. Mediastinal contours appear intact. Calcification of the aorta. Degenerative changes in the spine and left shoulder. Postoperative change in the right shoulder. IMPRESSION: Cardiac enlargement with pulmonary vascular congestion. Increasing pulmonary edema since previous study. Electronically Signed   By: Lucienne Capers M.D.   On: 12/23/2017 22:01   Dg Chest Port 1 View  Result  Date: 12/23/2017 CLINICAL DATA:  Sepsis, UTI symptoms, hematuria, history stroke, pacemaker/SVT, hypertension, former smoker EXAM: PORTABLE CHEST 1 VIEW COMPARISON:  Portable exam 0816 hours compared to 10/29/2017 FINDINGS: LEFT subclavian transvenous pacemaker with leads projecting over RIGHT atrium, RIGHT ventricle, and coronary sinus. Enlargement of cardiac silhouette with pulmonary vascular congestion. Atherosclerotic calcification aorta. Hazy interstitial infiltrate bilaterally likely pulmonary edema. No segmental consolidation, pleural effusion or pneumothorax. Bones demineralized. Reverse RIGHT shoulder arthroplasty noted. IMPRESSION: Enlargement of cardiac silhouette with vascular congestion and probable mild pulmonary edema. Electronically Signed   By: Lavonia Dana M.D.   On: 12/23/2017 08:25    EKG:  Sinus w P-synchronous/ AV  pacing Tel personally reviewed.  Recurrent episodes of long short initiated polymorphic ventricular tachycardia preceded the cardiac arrest.  Recurrent episodes of rapid atrial fibrillation over the last 12 hours have been unassociated with polymorphic VT   Assessment and Plan:  Torsade de pointes-polymorphic ventricular tachycardia  Atrial fibrillation-paroxysmal rapid rates  Dofetilide now on hold  Nonischemic cardiomyopathy  Hypertension  Urosepsis-E. coli-recurrent  Pacemaker-Medtronic-recent biventricular upgrade  Prostate cancer status post radiation with recurrent cystitis  CHADS-VASc score 6  Renal injury acute    Bilateral lower extremity edema-probable lymphedema with involvement of the forefoot   The patient had a late development following presentation with septic shock of torsade de pointes.  Renal function all relatively normal on presentation had gone from 1.1--1.7 in the consequence of a shock.  This and volume resuscitation which can acutely lengthen QT intervals likely contributed to the electrical instability in the torsade de  pointes Ventricular rhythms have been much more stable over the last 16 hours.  Agree with Dr. Lake Bells about diuresis  Agree with Dr. Renard Hamper last night about holding dofetilide.  Would ask ID for input regarding recurrent urosepsis.  Thankfully, E. coli is infrequently involving CIED's  Agree with the use of as needed metoprolol for rate control.  Now that his blood pressure is better we will add diltiazem intravenously  In the event that he has recurrent runs of polymorphic ventricular tachycardia (reviewed with nursing) with use lidocaine as it shortness QT interval.  In the context of atrial fibrillation programming his pacemaker would not be helpful, but if he were to occur in the context of sinus rhythm overdrive pacing can also be beneficial and that it also reduces QT interval  Agree also with Dr. Renard Hamper about no QT prolonging drugs  Consider VVS consultation Monday to look at when there is lower extremity edema is intact lymphedema given the forefoot involvement  Recheck metabolic profile at 6754 hrs. given the diuresis   Virl Axe

## 2017-12-24 NOTE — Progress Notes (Signed)
Chaplain presented to the patient's room to provide documentation for an Advance Directive. The patient;s wife stated they completed one several years ago, but felt she would like to update the documentation. She was advised to have staff notify the Spiritual Care office to have notarized, and witnessed Chaplain Yaakov Guthrie [phone].

## 2017-12-24 NOTE — Progress Notes (Signed)
Progress Note  Patient Name: Christopher Baker Date of Encounter: 12/24/2017  Primary Cardiologist: No primary care provider on file.   Subjective   Short of breath.  Family at bedside.  Chest pain.  Inpatient Medications    Scheduled Meds: . apixaban  2.5 mg Oral BID  . atorvastatin  20 mg Oral Daily  . donepezil  10 mg Oral QHS  . DULoxetine  30 mg Oral QHS  . ferrous sulfate  325 mg Oral BID WC  . folic acid  1 mg Oral QHS  . furosemide  60 mg Intravenous Q6H  . mouth rinse  15 mL Mouth Rinse BID  . metoprolol succinate  25 mg Oral QHS  . pantoprazole  40 mg Oral QHS   Continuous Infusions: . sodium chloride    . cefTRIAXone (ROCEPHIN)  IV Stopped (12/24/17 0220)   PRN Meds: sodium chloride, acetaminophen **OR** acetaminophen, oxyCODONE-acetaminophen   Vital Signs    Vitals:   12/24/17 0900 12/24/17 1000 12/24/17 1100 12/24/17 1200  BP: (!) 142/78 (!) 143/66 (!) 164/93 131/71  Pulse: 85 88 (!) 109 (!) 49  Resp: 20 (!) 24 (!) 33 (!) 30  Temp:    100.2 F (37.9 C)  TempSrc:    Oral  SpO2: 99% 99% 100% 100%  Weight:      Height:        Intake/Output Summary (Last 24 hours) at 12/24/2017 1308 Last data filed at 12/24/2017 1100 Gross per 24 hour  Intake 725.15 ml  Output 1775 ml  Net -1049.85 ml   Filed Weights   12/23/17 0901 12/23/17 2222 12/24/17 0400  Weight: 218 lb (98.9 kg) 217 lb 2.5 oz (98.5 kg) 216 lb 14.9 oz (98.4 kg)    Telemetry    Paced rhythm- Personally Reviewed  ECG    Normal sinus rhythm with by V pacing noted- Personally Reviewed  Physical Exam  Elderly, critically ill, with some respiratory distress. GEN:  Respiratory distress noted.  Is diaphoretic. Neck: Elevated JVD Cardiac: RRR, no murmurs, rubs, or gallops.  Respiratory: Clear to auscultation bilaterally. GI: Soft, nontender, non-distended  MS: No edema; No deformity. Neuro:  Nonfocal  Psych: Normal affect   Labs    Chemistry Recent Labs  Lab 12/23/17 0813  12/23/17 0845 12/23/17 2205 12/24/17 0342  NA 142 142 143 143  K 3.7 3.7 4.5 4.4  CL 109 107 112* 112*  CO2 22  --  16* 19*  GLUCOSE 127* 124* 200* 163*  BUN 26* 26* 28* 31*  CREATININE 1.37* 1.10 1.66* 1.61*  CALCIUM 9.4  --  8.9 8.9  PROT 6.0*  --   --  5.4*  ALBUMIN 3.1*  --   --  2.5*  AST 31  --   --  145*  ALT 28  --   --  125*  ALKPHOS 85  --   --  90  BILITOT 0.8  --   --  0.8  GFRNONAA 46*  --  37* 38*  GFRAA 54*  --  43* 44*  ANIONGAP 11  --  15 12     Hematology Recent Labs  Lab 12/23/17 0813 12/23/17 0845 12/23/17 2205 12/24/17 0342  WBC 4.3  --  33.5* 28.5*  RBC 4.33  --  4.13* 3.72*  HGB 12.1* 12.2* 11.5* 10.5*  HCT 39.6 36.0* 39.0 34.1*  MCV 91.5  --  94.4 91.7  MCH 27.9  --  27.8 28.2  MCHC 30.6  --  29.5*  30.8  RDW 15.9*  --  16.2* 16.4*  PLT 235  --  217 193    Cardiac Enzymes Recent Labs  Lab 12/23/17 2205 12/24/17 0342 12/24/17 0853  TROPONINI 1.55* 1.55* 1.18*   No results for input(s): TROPIPOC in the last 168 hours.   BNPNo results for input(s): BNP, PROBNP in the last 168 hours.   DDimer No results for input(s): DDIMER in the last 168 hours.   Radiology    Dg Chest Port 1 View  Result Date: 12/23/2017 CLINICAL DATA:  Fluid overload EXAM: PORTABLE CHEST 1 VIEW COMPARISON:  12/23/2017 FINDINGS: Cardiac pacemaker. Cardiac enlargement with pulmonary vascular congestion. Since the prior study, there is increasing airspace and interstitial infiltration in the lungs suggesting progression of pulmonary edema. No pleural effusions. No pneumothorax. Mediastinal contours appear intact. Calcification of the aorta. Degenerative changes in the spine and left shoulder. Postoperative change in the right shoulder. IMPRESSION: Cardiac enlargement with pulmonary vascular congestion. Increasing pulmonary edema since previous study. Electronically Signed   By: Lucienne Capers M.D.   On: 12/23/2017 22:01   Dg Chest Port 1 View  Result Date:  12/23/2017 CLINICAL DATA:  Sepsis, UTI symptoms, hematuria, history stroke, pacemaker/SVT, hypertension, former smoker EXAM: PORTABLE CHEST 1 VIEW COMPARISON:  Portable exam 0816 hours compared to 10/29/2017 FINDINGS: LEFT subclavian transvenous pacemaker with leads projecting over RIGHT atrium, RIGHT ventricle, and coronary sinus. Enlargement of cardiac silhouette with pulmonary vascular congestion. Atherosclerotic calcification aorta. Hazy interstitial infiltrate bilaterally likely pulmonary edema. No segmental consolidation, pleural effusion or pneumothorax. Bones demineralized. Reverse RIGHT shoulder arthroplasty noted. IMPRESSION: Enlargement of cardiac silhouette with vascular congestion and probable mild pulmonary edema. Electronically Signed   By: Lavonia Dana M.D.   On: 12/23/2017 08:25    Cardiac Studies   No new data  Patient Profile     84 y.o. male male with paroxysmal atrial fibrillation ([place] = 6) on apixiban and dofetilide, mild to moderate aortic stenosis, non-ischemic cardiomyopathy, complete heart block status-post biventricular Medtronic pacemaker implantation, who is being seen today for the evaluation of polymorphic ventricular tachycaria arrest at the request of the pulmonary critical care team.    Assessment & Plan    1. Acute on chronic combined systolic and diastolic heart failure aggravated by IV fluids.  Recommend aggressive diuresis but following blood pressure, electrolytes, and kidney function closely 2. Polymorphic VT, resolved. 3. Sepsis with bacteremia   Please refer to EP note from earlier today.  Agree diuresis with electrolyte correction is key.  Volume removal however will help decrease likelihood of requiring intubation.      For questions or updates, please contact [facility] Please consult [email or web address] for contact info under Cardiology/STEMI.      Signed, Sinclair Grooms, MD  12/24/2017, 1:08 PM

## 2017-12-24 NOTE — Progress Notes (Signed)
PULMONARY / CRITICAL CARE MEDICINE   Name: Christopher Baker MRN: [id number] DOB: [birth year]    ADMISSION DATE:  12/23/2017  REFERRING MD:  Renne Crigler  CHIEF COMPLAINT:  VT arrest  HISTORY OF PRESENT ILLNESS:   84 y/o male with a past medical history of CHF was admitted with sepsis, had a cardiac arrest afterwards.    SUBJECTIVE:  Brought to the ICU overnight after a VT arrest Feels better this morning.  Noted dyspnea prior to the arrest, no chest pain, still has dyspnea this morning  VITAL SIGNS: BP (!) 142/78   Pulse 85   Temp (!) 97.3 F (36.3 C) (Oral)   Resp 20   Ht 6\' 3"  (1.905 m)   Wt 216 lb 14.9 oz (98.4 kg)   SpO2 99%   BMI 27.11 kg/m   HEMODYNAMICS:    VENTILATOR SETTINGS: FiO2 (%):  [70 %-80 %] 70 %  INTAKE / OUTPUT: I/O last 3 completed shifts: In: 2237.7 [I.V.:619.3; IV Piggyback:1618.3] Out: 1450 [Urine:1375; Stool:75]  PHYSICAL EXAMINATION:  General:  Resting comfortably in bed HENT: NCAT OP clear PULM: Crackles bases, slightly increased effort but OK, normal effort CV: RRR, systolic murmur GI: BS+, soft, nontender MSK: normal bulk and tone Neuro: awake, alert, no distress, MAEW    LABS:  BMET Recent Labs  Lab 12/23/17 0813 12/23/17 0845 12/23/17 2205 12/24/17 0342  NA 142 142 143 143  K 3.7 3.7 4.5 4.4  CL 109 107 112* 112*  CO2 22  --  16* 19*  BUN 26* 26* 28* 31*  CREATININE 1.37* 1.10 1.66* 1.61*  GLUCOSE 127* 124* 200* 163*    Electrolytes Recent Labs  Lab 12/23/17 0813 12/23/17 2205 12/24/17 0342  CALCIUM 9.4 8.9 8.9  MG  --  2.0  --   PHOS  --  4.1  --     CBC Recent Labs  Lab 12/23/17 0813 12/23/17 0845 12/23/17 2205 12/24/17 0342  WBC 4.3  --  33.5* 28.5*  HGB 12.1* 12.2* 11.5* 10.5*  HCT 39.6 36.0* 39.0 34.1*  PLT 235  --  217 193    Coag's No results for input(s): APTT, INR in the last 168 hours.  Sepsis Markers Recent Labs  Lab 12/23/17 1204 12/23/17 2205 12/23/17 2355 12/24/17 0342   LATICACIDVEN 2.6* 5.6* 4.0*  --   PROCALCITON  --   --  44.53 44.22    ABG Recent Labs  Lab 12/23/17 1054 12/23/17 2340  PHART 7.440 7.440  PCO2ART 29.4* 26.8*  PO2ART 104.0 73.5*    Liver Enzymes Recent Labs  Lab 12/23/17 0813 12/24/17 0342  AST 31 145*  ALT 28 125*  ALKPHOS 85 90  BILITOT 0.8 0.8  ALBUMIN 3.1* 2.5*    Cardiac Enzymes Recent Labs  Lab 12/23/17 2205 12/24/17 0342  TROPONINI 1.55* 1.55*    Glucose Recent Labs  Lab 12/23/17 2346 12/24/17 0336 12/24/17 0839  GLUCAP 179* 148* 137*    Imaging Dg Chest Port 1 View  Result Date: 12/23/2017 CLINICAL DATA:  Fluid overload EXAM: PORTABLE CHEST 1 VIEW COMPARISON:  12/23/2017 FINDINGS: Cardiac pacemaker. Cardiac enlargement with pulmonary vascular congestion. Since the prior study, there is increasing airspace and interstitial infiltration in the lungs suggesting progression of pulmonary edema. No pleural effusions. No pneumothorax. Mediastinal contours appear intact. Calcification of the aorta. Degenerative changes in the spine and left shoulder. Postoperative change in the right shoulder. IMPRESSION: Cardiac enlargement with pulmonary vascular congestion. Increasing pulmonary edema since previous study. Electronically Signed  By: Lucienne Capers M.D.   On: 12/23/2017 22:01     STUDIES:  2018 Echo> LVEF 20-25%, mild to moderate AS, mild MR, LAE, RAE  CULTURES: 7/5 urine > GNR 7/5 Blood > e coli  ANTIBIOTICS: 7/5 zosy > x1 7/5 vanc x1 7/5 ceftriaxone >   SIGNIFICANT EVENTS: 7/5 late PM VT arrest, CPR, no ventilation  LINES/TUBES:   DISCUSSION: 84 y/o male with urinary sepsis causing e coli bacteremia received crystalloid resuscitation and developed acute pulmonary edema and a VT arrest late in the evening of 7/5.    ASSESSMENT / PLAN:  PULMONARY A: Acute respiratory failure with hypoxemia Acute pulmonary edema  P:   Continue diuresis> lasix ordered bid today Continue O2 to  maintain O2 saturation > 90%  CARDIOVASCULAR A:  Acute on chronic systolic heart failure Afib VT arrest Aortic stenosis P:  Tele Ensure cardiology seeing today Will continue metoprolol as not in shock Echo ordered  RENAL A:   Acute on chronic renal failure, baseline Cr 1.1-1.4 P:   Monitor BMET and UOP Replace electrolytes as needed Continue lasix today Follow up Mg from this morning  GASTROINTESTINAL A:   No acute issues P:   Advance diet> heart healthy, low sodium  HEMATOLOGIC A:   Anemia without bleeding P:  Monitor for bleeding Transfuse PRBC for Hgb < 7 gm/dL   INFECTIOUS A:   E coli bacteremia from UTI P:   Continue ceftriaxone, plan 7 day course Repeat blood cultures given hardware (pacemaker, artificial joints)  ENDOCRINE A:   Hyperglycemia P:   SSI  NEUROLOGIC A:   No acute issues P:   Minimize sedating medications  FAMILY  - Updates: wife, son updated bedside  - Inter-disciplinary family meet or Palliative Care meeting due by:  Day 7  This patient is critically ill and needs to be monitored in the ICU given his significant risk of death from respiratory failure and cardiac failure form acute on chronic systolic heart failure and septic shock.  My cc time 40 minutes  Roselie Awkward, MD [facility] Pager: [phone] Cell: [phone] After 3pm or if no response, call [phone]   12/24/2017, 9:25 AM

## 2017-12-25 ENCOUNTER — Inpatient Hospital Stay (HOSPITAL_COMMUNITY): Payer: PPO

## 2017-12-25 DIAGNOSIS — Z888 Allergy status to other drugs, medicaments and biological substances status: Secondary | ICD-10-CM

## 2017-12-25 DIAGNOSIS — R32 Unspecified urinary incontinence: Secondary | ICD-10-CM

## 2017-12-25 DIAGNOSIS — I480 Paroxysmal atrial fibrillation: Secondary | ICD-10-CM

## 2017-12-25 DIAGNOSIS — Z7901 Long term (current) use of anticoagulants: Secondary | ICD-10-CM

## 2017-12-25 DIAGNOSIS — I361 Nonrheumatic tricuspid (valve) insufficiency: Secondary | ICD-10-CM

## 2017-12-25 DIAGNOSIS — Z8619 Personal history of other infectious and parasitic diseases: Secondary | ICD-10-CM

## 2017-12-25 DIAGNOSIS — B9620 Unspecified Escherichia coli [E. coli] as the cause of diseases classified elsewhere: Secondary | ICD-10-CM

## 2017-12-25 DIAGNOSIS — I428 Other cardiomyopathies: Secondary | ICD-10-CM

## 2017-12-25 DIAGNOSIS — Z885 Allergy status to narcotic agent status: Secondary | ICD-10-CM

## 2017-12-25 DIAGNOSIS — Z8546 Personal history of malignant neoplasm of prostate: Secondary | ICD-10-CM

## 2017-12-25 DIAGNOSIS — D72829 Elevated white blood cell count, unspecified: Secondary | ICD-10-CM

## 2017-12-25 DIAGNOSIS — R918 Other nonspecific abnormal finding of lung field: Secondary | ICD-10-CM

## 2017-12-25 DIAGNOSIS — R7881 Bacteremia: Secondary | ICD-10-CM

## 2017-12-25 DIAGNOSIS — Z87891 Personal history of nicotine dependence: Secondary | ICD-10-CM

## 2017-12-25 DIAGNOSIS — Z95810 Presence of automatic (implantable) cardiac defibrillator: Secondary | ICD-10-CM

## 2017-12-25 DIAGNOSIS — Z87440 Personal history of urinary (tract) infections: Secondary | ICD-10-CM

## 2017-12-25 DIAGNOSIS — I4581 Long QT syndrome: Secondary | ICD-10-CM

## 2017-12-25 DIAGNOSIS — Z96611 Presence of right artificial shoulder joint: Secondary | ICD-10-CM

## 2017-12-25 DIAGNOSIS — Z96653 Presence of artificial knee joint, bilateral: Secondary | ICD-10-CM

## 2017-12-25 DIAGNOSIS — N390 Urinary tract infection, site not specified: Secondary | ICD-10-CM

## 2017-12-25 DIAGNOSIS — Z91013 Allergy to seafood: Secondary | ICD-10-CM

## 2017-12-25 DIAGNOSIS — Z881 Allergy status to other antibiotic agents status: Secondary | ICD-10-CM

## 2017-12-25 DIAGNOSIS — Z91048 Other nonmedicinal substance allergy status: Secondary | ICD-10-CM

## 2017-12-25 LAB — GLUCOSE, CAPILLARY
GLUCOSE-CAPILLARY: 169 mg/dL — AB (ref 70–99)
GLUCOSE-CAPILLARY: 148 mg/dL — AB (ref 70–99)
GLUCOSE-CAPILLARY: 138 mg/dL — AB (ref 70–99)
Glucose-Capillary: 124 mg/dL — ABNORMAL HIGH (ref 70–99)
Glucose-Capillary: 127 mg/dL — ABNORMAL HIGH (ref 70–99)
Glucose-Capillary: 139 mg/dL — ABNORMAL HIGH (ref 70–99)
Glucose-Capillary: 141 mg/dL — ABNORMAL HIGH (ref 70–99)

## 2017-12-25 LAB — BASIC METABOLIC PANEL
Anion gap: 8 (ref 5–15)
BUN: 36 mg/dL — ABNORMAL HIGH (ref 8–23)
CALCIUM: 9 mg/dL (ref 8.9–10.3)
CO2: 24 mmol/L (ref 22–32)
CREATININE: 1.52 mg/dL — AB (ref 0.61–1.24)
Chloride: 112 mmol/L — ABNORMAL HIGH (ref 98–111)
GFR calc non Af Amer: 41 mL/min — ABNORMAL LOW (ref >60)
GFR, EST AFRICAN AMERICAN: 47 mL/min — AB (ref >60)
GLUCOSE: 137 mg/dL — AB (ref 70–99)
Potassium: 3.6 mmol/L (ref 3.5–5.1)
Sodium: 144 mmol/L (ref 135–145)

## 2017-12-25 LAB — CBC WITH DIFFERENTIAL/PLATELET
BASOS PCT: 0 %
Basophils Absolute: 0 10*3/uL (ref 0.0–0.1)
EOS PCT: 0 %
Eosinophils Absolute: 0 10*3/uL (ref 0.0–0.7)
HEMATOCRIT: 33.1 % — AB (ref 39.0–52.0)
HEMOGLOBIN: 9.9 g/dL — AB (ref 13.0–17.0)
LYMPHS PCT: 1 %
Lymphs Abs: 0.2 10*3/uL — ABNORMAL LOW (ref 0.7–4.0)
MCH: 27.7 pg (ref 26.0–34.0)
MCHC: 29.9 g/dL — ABNORMAL LOW (ref 30.0–36.0)
MCV: 92.5 fL (ref 78.0–100.0)
MONOS PCT: 5 %
Monocytes Absolute: 0.9 10*3/uL (ref 0.1–1.0)
NEUTROS PCT: 94 %
Neutro Abs: 17.2 10*3/uL — ABNORMAL HIGH (ref 1.7–7.7)
Platelets: 163 10*3/uL (ref 150–400)
RBC: 3.58 MIL/uL — ABNORMAL LOW (ref 4.22–5.81)
RDW: 16.4 % — ABNORMAL HIGH (ref 11.5–15.5)
WBC: 18.3 10*3/uL — ABNORMAL HIGH (ref 4.0–10.5)

## 2017-12-25 LAB — URINE CULTURE

## 2017-12-25 LAB — ECHOCARDIOGRAM COMPLETE
Height: 75 in
Weight: 3396.85 oz

## 2017-12-25 LAB — CULTURE, BLOOD (ROUTINE X 2): SPECIAL REQUESTS: ADEQUATE

## 2017-12-25 LAB — MAGNESIUM: MAGNESIUM: 2.1 mg/dL (ref 1.7–2.4)

## 2017-12-25 LAB — PROCALCITONIN: PROCALCITONIN: 32 ng/mL

## 2017-12-25 MED ORDER — PIPERACILLIN-TAZOBACTAM 3.375 G IVPB
3.3750 g | Freq: Three times a day (TID) | INTRAVENOUS | Status: DC
  Administered 2017-12-25 – 2017-12-26 (×4): 3.375 g via INTRAVENOUS
  Filled 2017-12-25 (×5): qty 50

## 2017-12-25 MED ORDER — TRAMADOL HCL 50 MG PO TABS: 25.0000 mg | ORAL_TABLET | Freq: Once | ORAL | Status: DC

## 2017-12-25 MED ORDER — PERFLUTREN LIPID MICROSPHERE
1.0000 mL | INTRAVENOUS | Status: AC | PRN
  Administered 2017-12-25: 3 mL via INTRAVENOUS
  Filled 2017-12-25: qty 10

## 2017-12-25 MED ORDER — PERFLUTREN LIPID MICROSPHERE
INTRAVENOUS | Status: AC
  Administered 2017-12-25: 10:00:00
  Filled 2017-12-25: qty 10

## 2017-12-25 MED ORDER — LIDOCAINE 5 % EX PTCH
1.0000 | MEDICATED_PATCH | CUTANEOUS | Status: DC
  Administered 2017-12-25 – 2017-12-29 (×5): 1 via TRANSDERMAL
  Filled 2017-12-25 (×8): qty 1

## 2017-12-25 MED ORDER — POTASSIUM CHLORIDE 20 MEQ PO PACK
40.0000 meq | PACK | Freq: Once | ORAL | Status: AC
  Administered 2017-12-25: 40 meq via ORAL
  Filled 2017-12-25: qty 2

## 2017-12-25 MED ORDER — FUROSEMIDE 10 MG/ML IJ SOLN
60.0000 mg | Freq: Four times a day (QID) | INTRAMUSCULAR | Status: AC
Start: 2017-12-25 — End: 2017-12-25
  Administered 2017-12-25 (×2): 60 mg via INTRAVENOUS
  Filled 2017-12-25 (×2): qty 6

## 2017-12-25 NOTE — Progress Notes (Signed)
  Echocardiogram 2D Echocardiogram has been performed.  Christopher Baker 12/25/2017, 10:17 AM

## 2017-12-25 NOTE — Consult Note (Signed)
[facility]  Total days of antibiotics 3        Day 1 piptazo               Reason for Consult: e.coli bacteremia    Referring Physician: [email or web address]  Principal Problem:   Ventricular tachycardia ([place]) Active Problems:   Sepsis ([place])   Pressure injury of skin   E coli bacteremia   Urinary tract infection   Acute pulmonary edema (HCC)   Drug-induced torsades de pointes    HPI: Christopher Baker is a 84 y.o. male with hx of NICM, PAF, EF 25% in 2018 s/p biV pacer, hx of prostate ca s/p s/p TURBT and clot evacuation with fulguration on 4/26/19by Dr. Amalia Hailey for gross hematuriaassociated with radiation cystitis and chronic anticoagulation. He was last admitted for  10/29/17 - 11/03/17 for sepsis secondary to UTI and  E. Coli bacteremia. He was treated with IV antibiotics and was discharged on cefdinir  x7 days  to complete a 12 day course of treatment. He had returned back to his usual health. He had followed up with his urologist, dr Amalia Hailey, where he had repeat cx that was cleared of e.coli infection. He still had ongoing incontinence (which he has had since prostate ca tx) but no longer having hematuria. 2 days prior to admit, his wife noticed the patient complained of shaking chills but no fever when she checked his temperature, then the night of admit, he had incontinence in the hallway without knowledge, and found to be delirious. On admit, he was febrile to 104F, with afib in 90-150s. With LA of 4.WBC at 28.5K His infectious work up showed that he had ecoli bacteremia again. His course was further complicated as he had pulseless VT cardiac arrest in the evening of 7/5, where he was defibrillated.he is now stable, not requiring pressors. ID asked to weigh on management of bacteremia. His abtx changed to piptazo due to concern for possible aspiration pneumonia,patchy infiltrates on cxr per my review. He ins now afebrile, and leukocytosis is improving. Repeat blood cx are ngtd  at 24hr.  He has hx of bilateral TKA and right shoulder replacement. He has had a staph infection requiring what sounds like a 2 staged revision in the past.  Past Medical History:  Diagnosis Date  . Anxiety   . Arthritis    "hands, right hip; lower back" (02/28/2017)  . Arthritis with psoriasis ([place])   . Borderline diabetes    DIET CONTROLLED - PT STATES HE IS NOT DIABETIC  . Carotid stenosis, bilateral PER DR EARLY / DUPLEX  09-09-10     40 - 59%   BILATERALLY--- ASYMPTOMATIC  . Chronic lower back pain   . Depression   . Dysrhythmia    a-fib  . GERD (gastroesophageal reflux disease)    CONTROLLED W/ PROTONIX  . History of gout   . HTN (hypertension)   . Hyperlipidemia   . Iron deficiency   . Lumbar spondylosis W/ RADICULOPATHY  . OSA on CPAP   . PAF (paroxysmal atrial fibrillation) ([place])   . Presence of permanent cardiac pacemaker DDD-  06-04-10-   DDD; SECONDARY TO SYNCOPY AND BRADYCARDIA  . Prostate cancer ([place]) 2008   S/P 74 RADIATION  [place]    . Restless leg syndrome   . Stroke [facility]) Oct. 14, 2014   light left hand, balance issues, short term memory loss 2014  . SVT (supraventricular tachycardia) (HCC)    S/P ABLATION  2008    Allergies:  Allergies  Allergen Reactions  . Ciprofloxacin Nausea Only and Other (See Comments)    Stomach ache  . Hydrocodone-Acetaminophen Other (See Comments)    Hallucinations in higher doses  . Other Other (See Comments)    Rash from neoprene wrap after knee surgery  . Shellfish Allergy Nausea And Vomiting and Other (See Comments)    Reaction to scallops  . Tramadol Other (See Comments)    Pt becomes combative per wife      MEDICATIONS: . apixaban  2.5 mg Oral BID  . atorvastatin  20 mg Oral Daily  . donepezil  10 mg Oral QHS  . DULoxetine  30 mg Oral QHS  . ferrous sulfate  325 mg Oral BID WC  . folic acid  1 mg Oral QHS  . furosemide  60 mg Intravenous Q6H  . mouth rinse  15 mL Mouth Rinse BID  . metoprolol succinate  25  mg Oral QHS  . pantoprazole  40 mg Oral QHS  . perflutren lipid microspheres (DEFINITY) IV suspension        Social History   Tobacco Use  . Smoking status: Former Smoker    Packs/day: 1.00    Years: 45.00    Pack years: 45.00    Types: Cigarettes    Last attempt to quit: 12/07/1998    Years since quitting: 19.0  . Smokeless tobacco: Never Used  Substance Use Topics  . Alcohol use: Yes    Comment: 1 cocktail per evening  . Drug use: No    Family History  Problem Relation Age of Onset  . Stroke Mother   . Early death Father    Review of Systems  Constitutional: Negative for fever, chills, diaphoresis, activity change, appetite change, fatigue and unexpected weight change.  HENT: Negative for congestion, sore throat, rhinorrhea, sneezing, trouble swallowing and sinus pressure.  Eyes: Negative for photophobia and visual disturbance.  Respiratory: Negative for cough, chest tightness, shortness of breath, wheezing and stridor.  Cardiovascular: Negative for chest pain, palpitations and leg swelling.  Gastrointestinal: Negative for nausea, vomiting, abdominal pain, diarrhea, constipation, blood in stool, abdominal distention and anal bleeding.  Genitourinary: Negative for dysuria, hematuria, flank pain and difficulty urinating.  Musculoskeletal: Negative for myalgias, back pain, joint swelling, arthralgias and gait problem.  Skin: Negative for color change, pallor, rash and wound.  Neurological: Negative for dizziness, tremors, weakness and light-headedness.  Hematological: Negative for adenopathy. Does not bruise/bleed easily.  Psychiatric/Behavioral: Negative for behavioral problems, confusion, sleep disturbance, dysphoric mood, decreased concentration and agitation.     OBJECTIVE: Temp:  [97.4 F (36.3 C)-100.2 F (37.9 C)] 98.9 F (37.2 C) (07/07 0826) Pulse Rate:  [42-109] 93 (07/07 0700) Resp:  [16-33] 18 (07/07 0700) BP: (106-166)/(54-93) 130/73 (07/07 0700) SpO2:   [96 %-100 %] 97 % (07/07 0826) FiO2 (%):  [70 %] 70 % (07/07 0826) Weight:  [212 lb 4.9 oz (96.3 kg)] 212 lb 4.9 oz (96.3 kg) (07/07 0500) Physical Exam  Constitutional: He is oriented to person, sleepy, easily awakens He appears his stated age, well-developed and well-nourished. No distress.  HENT:  Mouth/Throat: Oropharynx is clear and dry. No signs of thrush  Cardiovascular: Normal rate, regular rhythm and normal heart sounds. Exam reveals no gallop and no friction rub.  No murmur heard.  Pulmonary/Chest: Effort normal and breath sounds normal. Mild rhonchi at bases  Abdominal: Soft. Bowel sounds are normal. He exhibits no distension. There is no tenderness.  Lymphadenopathy:  He has no cervical adenopathy.  Neck : elevated jvd Skin: Skin is warm and dry. Scattered echymosis Ext: +2 edema to lower extremities Psychiatric: He has a normal mood and affect. His behavior is normal.   LABS: Results for orders placed or performed during the hospital encounter of 12/23/17 (from the past 48 hour(s))  I-Stat CG4 Lactic Acid, ED  (not at  [facility])     Status: Abnormal   Collection Time: 12/23/17 10:52 AM  Result Value Ref Range   Lactic Acid, Venous 2.32 (HH) 0.5 - 1.9 mmol/L   Comment NOTIFIED PHYSICIAN   I-Stat arterial blood gas, ED     Status: Abnormal   Collection Time: 12/23/17 10:54 AM  Result Value Ref Range   pH, Arterial 7.440 7.350 - 7.450   pCO2 arterial 29.4 (L) 32.0 - 48.0 mmHg   pO2, Arterial 104.0 83.0 - 108.0 mmHg   Bicarbonate 20.0 20.0 - 28.0 mmol/L   TCO2 21 (L) 22 - 32 mmol/L   O2 Saturation 98.0 %   Acid-base deficit 3.0 (H) 0.0 - 2.0 mmol/L   Patient temperature 98.6 F    Collection site RADIAL, ALLEN'S TEST ACCEPTABLE    Drawn by Operator    Sample type ARTERIAL   Lactic acid, plasma     Status: Abnormal   Collection Time: 12/23/17 12:04 PM  Result Value Ref Range   Lactic Acid, Venous 2.6 (HH) 0.5 - 1.9 mmol/L    Comment: CRITICAL RESULT CALLED TO, READ BACK  BY AND VERIFIED WITH: RYAN SANTIAGO,RN AT 1315 12/23/17 BY ZBEECH. Performed at [facility], [place] [address] [place], [place] [address]   MRSA PCR Screening     Status: None   Collection Time: 12/23/17  9:06 PM  Result Value Ref Range   MRSA by PCR NEGATIVE NEGATIVE    Comment:        The GeneXpert MRSA Assay (FDA approved for NASAL specimens only), is one component of a comprehensive MRSA colonization surveillance program. It is not intended to diagnose MRSA infection nor to guide or monitor treatment for MRSA infections. Performed at [facility], [place] [address], [place], [place] [address]   Basic metabolic panel     Status: Abnormal   Collection Time: 12/23/17 10:05 PM  Result Value Ref Range   Sodium 143 135 - 145 mmol/L   Potassium 4.5 3.5 - 5.1 mmol/L   Chloride 112 (H) 98 - 111 mmol/L    Comment: Please note change in reference range.   CO2 16 (L) 22 - 32 mmol/L   Glucose, Bld 200 (H) 70 - 99 mg/dL    Comment: Please note change in reference range.   BUN 28 (H) 8 - 23 mg/dL    Comment: Please note change in reference range.   Creatinine, Ser 1.66 (H) 0.61 - 1.24 mg/dL   Calcium 8.9 8.9 - 10.3 mg/dL   GFR calc non Af Amer 37 (L) >60 mL/min   GFR calc Af Amer 43 (L) >60 mL/min    Comment: (NOTE) The eGFR has been calculated using the CKD EPI equation. This calculation has not been validated in all clinical situations. eGFR's persistently <60 mL/min signify possible Chronic Kidney Disease.    Anion gap 15 5 - 15    Comment: Performed at [facility] [address], [place], [place] [address]  Magnesium     Status: None   Collection Time: 12/23/17 10:05 PM  Result Value Ref Range  Magnesium 2.0 1.7 - 2.4 mg/dL    Comment: Performed at [facility], [place] [address], [place], [place] [address]  CBC     Status: Abnormal   Collection Time: 12/23/17 10:05 PM  Result Value Ref Range   WBC 33.5 (H) 4.0 - 10.5 K/uL   RBC 4.13 (L) 4.22 - 5.81  MIL/uL   Hemoglobin 11.5 (L) 13.0 - 17.0 g/dL   HCT 39.0 39.0 - 52.0 %   MCV 94.4 78.0 - 100.0 fL   MCH 27.8 26.0 - 34.0 pg   MCHC 29.5 (L) 30.0 - 36.0 g/dL   RDW 16.2 (H) 11.5 - 15.5 %   Platelets 217 150 - 400 K/uL    Comment: Performed at [facility], [place] [address], [place], [place] [address]  Troponin I     Status: Abnormal   Collection Time: 12/23/17 10:05 PM  Result Value Ref Range   Troponin I 1.55 (HH) <0.03 ng/mL    Comment: CRITICAL RESULT CALLED TO, READ BACK BY AND VERIFIED WITH: NINO A,RN 12/23/17 2310 WAYK Performed at [facility], [place] [address] [place], [place] [address]   Lactic acid, plasma     Status: Abnormal   Collection Time: 12/23/17 10:05 PM  Result Value Ref Range   Lactic Acid, Venous 5.6 (HH) 0.5 - 1.9 mmol/L    Comment: CRITICAL RESULT CALLED TO, READ BACK BY AND VERIFIED WITH: NINO A,RN 12/23/17 2304 WAYK Performed at [facility], [place] [address] [place], [place] [address]   Phosphorus     Status: None   Collection Time: 12/23/17 10:05 PM  Result Value Ref Range   Phosphorus 4.1 2.5 - 4.6 mg/dL    Comment: Performed at [facility], [place] [address], [place],  [place] [address]  Blood gas, arterial     Status: Abnormal   Collection Time: 12/23/17 11:40 PM  Result Value Ref Range   O2 Content 7.0 L/min   Delivery systems NASAL CANNULA    pH, Arterial 7.440 7.350 - 7.450   pCO2 arterial 26.8 (L) 32.0 - 48.0 mmHg   pO2, Arterial 73.5 (L) 83.0 - 108.0 mmHg   Bicarbonate 17.9 (L) 20.0 - 28.0 mmol/L   Acid-base deficit 5.5 (H) 0.0 - 2.0 mmol/L   O2 Saturation 94.6 %   Patient temperature 98.6    Collection site LEFT RADIAL    Drawn by [id number]    Sample type ARTERIAL DRAW    Allens test (pass/fail) PASS PASS  Glucose, capillary     Status: Abnormal   Collection Time: 12/23/17 11:46 PM  Result Value Ref Range   Glucose-Capillary 179 (H) 70 - 99 mg/dL   Comment 1 Capillary Specimen   Lactic acid, plasma     Status:  Abnormal   Collection Time: 12/23/17 11:55 PM  Result Value Ref Range   Lactic Acid, Venous 4.0 (HH) 0.5 - 1.9 mmol/L    Comment: CRITICAL RESULT CALLED TO, READ BACK BY AND VERIFIED WITH: NINO A,RN 12/24/17 0047 WAYK Performed at [facility], [address], [place], [place] [address]   TSH     Status: None   Collection Time: 12/23/17 11:55 PM  Result Value Ref Range   TSH 1.724 0.350 - 4.500 uIU/mL    Comment: Performed by a 3rd Generation assay with a functional sensitivity of <=0.01 uIU/mL. Performed at [facility], [place] [address] [place], [place] [address]   Procalcitonin - Baseline  Status: None   Collection Time: 12/23/17 11:55 PM  Result Value Ref Range   Procalcitonin 44.53 ng/mL    Comment:        Interpretation: PCT >= 10 ng/mL: Important systemic inflammatory response, almost exclusively due to severe bacterial sepsis or septic shock. (NOTE)       Sepsis PCT Algorithm           Lower Respiratory Tract                                      Infection PCT Algorithm    ----------------------------     ----------------------------         PCT < 0.25 ng/mL                PCT < 0.10 ng/mL         Strongly encourage             Strongly discourage   discontinuation of antibiotics    initiation of antibiotics    ----------------------------     -----------------------------       PCT 0.25 - 0.50 ng/mL            PCT 0.10 - 0.25 ng/mL               OR       >80% decrease in PCT            Discourage initiation of                                            antibiotics      Encourage discontinuation           of antibiotics    ----------------------------     -----------------------------         PCT >= 0.50 ng/mL              PCT 0.26 - 0.50 ng/mL                AND       <80% decrease in PCT             Encourage initiation of                                             antibiotics       Encourage continuation           of antibiotics     ----------------------------     -----------------------------        PCT >= 0.50 ng/mL                  PCT > 0.50 ng/mL               AND         increase in PCT                  Strongly encourage                                      initiation of antibiotics  Strongly encourage escalation           of antibiotics                                     -----------------------------                                           PCT <= 0.25 ng/mL                                                 OR                                        > 80% decrease in PCT                                     Discontinue / Do not initiate                                             antibiotics Performed at [facility], [place] [address], [place], [place] [address]   Glucose, capillary     Status: Abnormal   Collection Time: 12/24/17  3:36 AM  Result Value Ref Range   Glucose-Capillary 148 (H) 70 - 99 mg/dL  Comprehensive metabolic panel     Status: Abnormal   Collection Time: 12/24/17  3:42 AM  Result Value Ref Range   Sodium 143 135 - 145 mmol/L   Potassium 4.4 3.5 - 5.1 mmol/L   Chloride 112 (H) 98 - 111 mmol/L    Comment: Please note change in reference range.   CO2 19 (L) 22 - 32 mmol/L   Glucose, Bld 163 (H) 70 - 99 mg/dL    Comment: Please note change in reference range.   BUN 31 (H) 8 - 23 mg/dL    Comment: Please note change in reference range.   Creatinine, Ser 1.61 (H) 0.61 - 1.24 mg/dL   Calcium 8.9 8.9 - 10.3 mg/dL   Total Protein 5.4 (L) 6.5 - 8.1 g/dL   Albumin 2.5 (L) 3.5 - 5.0 g/dL   AST 145 (H) 15 - 41 U/L   ALT 125 (H) 0 - 44 U/L    Comment: Please note change in reference range.   Alkaline Phosphatase 90 38 - 126 U/L   Total Bilirubin 0.8 0.3 - 1.2 mg/dL   GFR calc non Af Amer 38 (L) >60 mL/min   GFR calc Af Amer 44 (L) >60 mL/min    Comment: (NOTE) The eGFR has been calculated using the CKD EPI equation. This calculation has not been validated in all clinical  situations. eGFR's persistently <60 mL/min signify possible Chronic Kidney Disease.    Anion gap 12 5 - 15    Comment: Performed at [facility] [address] [place], [place] [address]  CBC     Status:  Abnormal   Collection Time: 12/24/17  3:42 AM  Result Value Ref Range   WBC 28.5 (H) 4.0 - 10.5 K/uL   RBC 3.72 (L) 4.22 - 5.81 MIL/uL   Hemoglobin 10.5 (L) 13.0 - 17.0 g/dL   HCT 34.1 (L) 39.0 - 52.0 %   MCV 91.7 78.0 - 100.0 fL   MCH 28.2 26.0 - 34.0 pg   MCHC 30.8 30.0 - 36.0 g/dL   RDW 16.4 (H) 11.5 - 15.5 %   Platelets 193 150 - 400 K/uL    Comment: Performed at [facility], [place] [address], [place], [place] [address]  Troponin I (q 6hr x 3)     Status: Abnormal   Collection Time: 12/24/17  3:42 AM  Result Value Ref Range   Troponin I 1.55 (HH) <0.03 ng/mL    Comment: CRITICAL VALUE NOTED.  VALUE IS CONSISTENT WITH PREVIOUSLY REPORTED AND CALLED VALUE. Performed at [facility], [place] [address] [place], [place] [address]   Procalcitonin     Status: None   Collection Time: 12/24/17  3:42 AM  Result Value Ref Range   Procalcitonin 44.22 ng/mL    Comment:        Interpretation: PCT >= 10 ng/mL: Important systemic inflammatory response, almost exclusively due to severe bacterial sepsis or septic shock. (NOTE)       Sepsis PCT Algorithm           Lower Respiratory Tract                                      Infection PCT Algorithm    ----------------------------     ----------------------------         PCT < 0.25 ng/mL                PCT < 0.10 ng/mL         Strongly encourage             Strongly discourage   discontinuation of antibiotics    initiation of antibiotics    ----------------------------     -----------------------------       PCT 0.25 - 0.50 ng/mL            PCT 0.10 - 0.25 ng/mL               OR       >80% decrease in PCT            Discourage initiation of                                            antibiotics      Encourage  discontinuation           of antibiotics    ----------------------------     -----------------------------         PCT >= 0.50 ng/mL              PCT 0.26 - 0.50 ng/mL                AND       <80% decrease in PCT             Encourage initiation of  antibiotics       Encourage continuation           of antibiotics    ----------------------------     -----------------------------        PCT >= 0.50 ng/mL                  PCT > 0.50 ng/mL               AND         increase in PCT                  Strongly encourage                                      initiation of antibiotics    Strongly encourage escalation           of antibiotics                                     -----------------------------                                           PCT <= 0.25 ng/mL                                                 OR                                        > 80% decrease in PCT                                     Discontinue / Do not initiate                                             antibiotics Performed at [facility], [address] [place], [place] [address]   Glucose, capillary     Status: Abnormal   Collection Time: 12/24/17  8:39 AM  Result Value Ref Range   Glucose-Capillary 137 (H) 70 - 99 mg/dL  Troponin I (q 6hr x 3)     Status: Abnormal   Collection Time: 12/24/17  8:53 AM  Result Value Ref Range   Troponin I 1.18 (HH) <0.03 ng/mL    Comment: CRITICAL VALUE NOTED.  VALUE IS CONSISTENT WITH PREVIOUSLY REPORTED AND CALLED VALUE. Performed at [facility], [place] [address] [place],  [address]   Magnesium     Status: None   Collection Time: 12/24/17  9:23 AM  Result Value Ref Range   Magnesium 2.0 1.7 - 2.4 mg/dL    Comment: Performed at [facility] [address], [place],  [address]  Basic metabolic panel     Status: Abnormal   Collection Time: 12/24/17  9:23  AM  Result Value Ref Range   Sodium  145 135 - 145 mmol/L   Potassium 3.8 3.5 - 5.1 mmol/L   Chloride 110 98 - 111 mmol/L    Comment: Please note change in reference range.   CO2 20 (L) 22 - 32 mmol/L   Glucose, Bld 133 (H) 70 - 99 mg/dL    Comment: Please note change in reference range.   BUN 33 (H) 8 - 23 mg/dL    Comment: Please note change in reference range.   Creatinine, Ser 1.53 (H) 0.61 - 1.24 mg/dL   Calcium 9.2 8.9 - 10.3 mg/dL   GFR calc non Af Amer 41 (L) >60 mL/min   GFR calc Af Amer 47 (L) >60 mL/min    Comment: (NOTE) The eGFR has been calculated using the CKD EPI equation. This calculation has not been validated in all clinical situations. eGFR's persistently <60 mL/min signify possible Chronic Kidney Disease.    Anion gap 15 5 - 15    Comment: Performed at [facility] [address] [place], [place] [address]  Culture, blood (Routine X 2) w Reflex to ID Panel     Status: None (Preliminary result)   Collection Time: 12/24/17 12:15 PM  Result Value Ref Range   Specimen Description BLOOD BLOOD RIGHT HAND    Special Requests      BOTTLES DRAWN AEROBIC AND ANAEROBIC Blood Culture adequate volume   Culture      NO GROWTH < 24 HOURS Performed at [facility], [place] [address], [place], [place] [address]    Report Status PENDING   Glucose, capillary     Status: Abnormal   Collection Time: 12/24/17 12:28 PM  Result Value Ref Range   Glucose-Capillary 147 (H) 70 - 99 mg/dL  Glucose, capillary     Status: Abnormal   Collection Time: 12/24/17  4:16 PM  Result Value Ref Range   Glucose-Capillary 134 (H) 70 - 99 mg/dL  Basic metabolic panel     Status: Abnormal   Collection Time: 12/24/17  5:16 PM  Result Value Ref Range   Sodium 141 135 - 145 mmol/L   Potassium 3.8 3.5 - 5.1 mmol/L   Chloride 111 98 - 111 mmol/L    Comment: Please note change in reference range.   CO2 21 (L) 22 - 32 mmol/L   Glucose, Bld 173 (H) 70 - 99 mg/dL    Comment: Please note change in reference range.   BUN 37  (H) 8 - 23 mg/dL    Comment: Please note change in reference range.   Creatinine, Ser 1.69 (H) 0.61 - 1.24 mg/dL   Calcium 8.9 8.9 - 10.3 mg/dL   GFR calc non Af Amer 36 (L) >60 mL/min   GFR calc Af Amer 42 (L) >60 mL/min    Comment: (NOTE) The eGFR has been calculated using the CKD EPI equation. This calculation has not been validated in all clinical situations. eGFR's persistently <60 mL/min signify possible Chronic Kidney Disease.    Anion gap 9 5 - 15    Comment: Performed at [facility] [address], [place], [place] [address]  Glucose, capillary     Status: Abnormal   Collection Time: 12/24/17  7:59 PM  Result Value Ref Range   Glucose-Capillary 152 (H) 70 - 99 mg/dL   Comment 1 Capillary Specimen    Comment 2 Notify RN   Glucose, capillary     Status: Abnormal  Collection Time: 12/24/17 11:29 PM  Result Value Ref Range   Glucose-Capillary 127 (H) 70 - 99 mg/dL   Comment 1 Capillary Specimen    Comment 2 Notify RN   Procalcitonin     Status: None   Collection Time: 12/25/17  3:34 AM  Result Value Ref Range   Procalcitonin 32.00 ng/mL    Comment:        Interpretation: PCT >= 10 ng/mL: Important systemic inflammatory response, almost exclusively due to severe bacterial sepsis or septic shock. (NOTE)       Sepsis PCT Algorithm           Lower Respiratory Tract                                      Infection PCT Algorithm    ----------------------------     ----------------------------         PCT < 0.25 ng/mL                PCT < 0.10 ng/mL         Strongly encourage             Strongly discourage   discontinuation of antibiotics    initiation of antibiotics    ----------------------------     -----------------------------       PCT 0.25 - 0.50 ng/mL            PCT 0.10 - 0.25 ng/mL               OR       >80% decrease in PCT            Discourage initiation of                                            antibiotics      Encourage discontinuation            of antibiotics    ----------------------------     -----------------------------         PCT >= 0.50 ng/mL              PCT 0.26 - 0.50 ng/mL                AND       <80% decrease in PCT             Encourage initiation of                                             antibiotics       Encourage continuation           of antibiotics    ----------------------------     -----------------------------        PCT >= 0.50 ng/mL                  PCT > 0.50 ng/mL               AND         increase in PCT                  Strongly encourage  initiation of antibiotics    Strongly encourage escalation           of antibiotics                                     -----------------------------                                           PCT <= 0.25 ng/mL                                                 OR                                        > 80% decrease in PCT                                     Discontinue / Do not initiate                                             antibiotics Performed at [facility], [address] [place], [place] [address]   Basic metabolic panel     Status: Abnormal   Collection Time: 12/25/17  3:34 AM  Result Value Ref Range   Sodium 144 135 - 145 mmol/L   Potassium 3.6 3.5 - 5.1 mmol/L   Chloride 112 (H) 98 - 111 mmol/L    Comment: Please note change in reference range.   CO2 24 22 - 32 mmol/L   Glucose, Bld 137 (H) 70 - 99 mg/dL    Comment: Please note change in reference range.   BUN 36 (H) 8 - 23 mg/dL    Comment: Please note change in reference range.   Creatinine, Ser 1.52 (H) 0.61 - 1.24 mg/dL   Calcium 9.0 8.9 - 10.3 mg/dL   GFR calc non Af Amer 41 (L) >60 mL/min   GFR calc Af Amer 47 (L) >60 mL/min    Comment: (NOTE) The eGFR has been calculated using the CKD EPI equation. This calculation has not been validated in all clinical situations. eGFR's persistently <60 mL/min signify possible Chronic  Kidney Disease.    Anion gap 8 5 - 15    Comment: Performed at [facility] [address], [place], [place] [address]  CBC with Differential/Platelet     Status: Abnormal (Preliminary result)   Collection Time: 12/25/17  3:34 AM  Result Value Ref Range   WBC 18.3 (H) 4.0 - 10.5 K/uL   RBC 3.58 (L) 4.22 - 5.81 MIL/uL   Hemoglobin 9.9 (L) 13.0 - 17.0 g/dL   HCT 33.1 (L) 39.0 - 52.0 %   MCV 92.5 78.0 - 100.0 fL   MCH 27.7 26.0 - 34.0 pg   MCHC 29.9 (L) 30.0 - 36.0 g/dL   RDW 16.4 (H) 11.5 - 15.5 %   Platelets  163 150 - 400 K/uL    Comment: Performed at [facility], [place] [address], [place], [place] [address]   Neutrophils Relative % PENDING %   Neutro Abs PENDING 1.7 - 7.7 K/uL   Band Neutrophils PENDING %   Lymphocytes Relative PENDING %   Lymphs Abs PENDING 0.7 - 4.0 K/uL   Monocytes Relative PENDING %   Monocytes Absolute PENDING 0.1 - 1.0 K/uL   Eosinophils Relative PENDING %   Eosinophils Absolute PENDING 0.0 - 0.7 K/uL   Basophils Relative PENDING %   Basophils Absolute PENDING 0.0 - 0.1 K/uL   WBC Morphology PENDING    RBC Morphology PENDING    Smear Review PENDING    nRBC PENDING 0 /100 WBC   Metamyelocytes Relative PENDING %   Myelocytes PENDING %   Promyelocytes Relative PENDING %   Blasts PENDING %  Magnesium     Status: None   Collection Time: 12/25/17  3:34 AM  Result Value Ref Range   Magnesium 2.1 1.7 - 2.4 mg/dL    Comment: Performed at [facility], [place] [address] [place], [place] [address]  Glucose, capillary     Status: Abnormal   Collection Time: 12/25/17  4:19 AM  Result Value Ref Range   Glucose-Capillary 139 (H) 70 - 99 mg/dL   Comment 1 Capillary Specimen    Comment 2 Notify RN   Glucose, capillary     Status: Abnormal   Collection Time: 12/25/17  8:35 AM  Result Value Ref Range   Glucose-Capillary 169 (H) 70 - 99 mg/dL   Comment 1 Capillary Specimen     MICRO: reviewed IMAGING: TTE no veg Dg Chest Port 1  View  Result Date: 12/25/2017 CLINICAL DATA:  Respiratory failure/hypoxia EXAM: PORTABLE CHEST 1 VIEW COMPARISON:  December 23, 2017 FINDINGS: Asymmetric interstitial edema remains on the right. Left lung is essentially clear. There is stable cardiomegaly with pulmonary vascularity normal. There is aortic atherosclerosis. No adenopathy. Pacemaker leads are attached to the right atrium, right ventricle, and coronary sinus. There is a total shoulder replacement on the right. IMPRESSION: Persistent asymmetric interstitial edema on the right. No frank consolidation. Left lung clear. Stable cardiac prominence. There is aortic atherosclerosis. Stable pacemaker lead positioning. Aortic Atherosclerosis (ICD10-I70.0). Electronically Signed   By: Lowella Grip III M.D.   On: 12/25/2017 07:11   Dg Chest Port 1 View  Result Date: 12/23/2017 CLINICAL DATA:  Fluid overload EXAM: PORTABLE CHEST 1 VIEW COMPARISON:  12/23/2017 FINDINGS: Cardiac pacemaker. Cardiac enlargement with pulmonary vascular congestion. Since the prior study, there is increasing airspace and interstitial infiltration in the lungs suggesting progression of pulmonary edema. No pleural effusions. No pneumothorax. Mediastinal contours appear intact. Calcification of the aorta. Degenerative changes in the spine and left shoulder. Postoperative change in the right shoulder. IMPRESSION: Cardiac enlargement with pulmonary vascular congestion. Increasing pulmonary edema since previous study. Electronically Signed   By: Lucienne Capers M.D.   On: 12/23/2017 22:01   Assessment/Plan:  84yo M with hx of BiV defibrillator admitted for AMS found to have sepsis due to ecoli bacteremia c/b VT arrest, possible aspiration pneumonia. This will have been a recurrence of ecoli bacteremia within 2 months.  Gram negative infection of cardiac device and joint replacements are not common.  - if his repeat blood cx are positive, recommend to get TEE- would need prolonged  treatment course - on exam, his knees do not appear warm or swollen  to suggests that they  need arthrocentesis  - would treat his bacteremia for a total of 2 wks using 7/6 as day 1. Continue on iv therapy while hospitalized and then discharge on orals. Can do oral cephalosporins again. If no longer having symptoms concerning for aspiration pneumonia, can narrow to ceftriaxone again.  Leukocytosis = appears improving as sign of treatment response  Recurrent uti = have discussed with patient and his wife look for signs/sx of uti, including his chills/rigors. Would follow up with dr Amalia Hailey again. At this time, would not necessarily place him on prophylaxis

## 2017-12-25 NOTE — Progress Notes (Signed)
Pharmacy Antibiotic Note  ANANT AGARD is a 84 y.o. male admitted on 12/23/2017 with sepsis.  Pharmacy has been consulted for Zosyn dosing.  Patient aspirated on food overnight, CXR worse this morning. Patient originally started on zosyn for sepsis on 7/5, narrowed to ceftriaxone a day later and now broadened back out to zosyn due to aspiration event overnight. Tmax 100.2, wbc 28 on admit, not rechecked.   Plan: Zosyn 3.375g IV q8h (4 hour infusion).  Height: 6\' 3"  (190.5 cm) Weight: 212 lb 4.9 oz (96.3 kg) IBW/kg (Calculated) : 84.5  Temp (24hrs), Avg:98.4 F (36.9 C), Min:97.4 F (36.3 C), Max:100.2 F (37.9 C)  Recent Labs  Lab 12/23/17 0813 12/23/17 0841  12/23/17 1052 12/23/17 1204 12/23/17 2205 12/23/17 2355 12/24/17 0342 12/24/17 0923 12/24/17 1716 12/25/17 0334  WBC 4.3  --   --   --   --  33.5*  --  28.5*  --   --   --   CREATININE 1.37*  --    < >  --   --  1.66*  --  1.61* 1.53* 1.69* 1.52*  LATICACIDVEN  --  4.20*  --  2.32* 2.6* 5.6* 4.0*  --   --   --   --    < > = values in this interval not displayed.    Estimated Creatinine Clearance: 44.8 mL/min (A) (by C-G formula based on SCr of 1.52 mg/dL (H)).    Allergies  Allergen Reactions  . Ciprofloxacin Nausea Only and Other (See Comments)    Stomach ache  . Hydrocodone-Acetaminophen Other (See Comments)    Hallucinations in higher doses  . Other Other (See Comments)    Rash from neoprene wrap after knee surgery  . Shellfish Allergy Nausea And Vomiting and Other (See Comments)    Reaction to scallops  . Tramadol Other (See Comments)    Pt becomes combative per wife   Thank you for allowing pharmacy to be a part of this patient's care.  Erin Hearing PharmD., BCPS Clinical Pharmacist 12/25/2017 10:07 AM

## 2017-12-25 NOTE — Progress Notes (Signed)
PULMONARY / CRITICAL CARE MEDICINE   Name: Christopher Baker MRN: [id number] DOB: 04/26/1934    ADMISSION DATE:  12/23/2017  REFERRING MD:  Renne Crigler  CHIEF COMPLAINT:  VT arrest  HISTORY OF PRESENT ILLNESS:   84 y/o male with a past medical history of CHF was admitted with sepsis, had a cardiac arrest afterwards.    SUBJECTIVE:  Aspirated on some food overnight Feels about the same Diuresed some Started on lidocaine for ventricular ectopy  VITAL SIGNS: BP 130/73   Pulse 93   Temp 98.9 F (37.2 C) (Oral)   Resp 18   Ht 6\' 3"  (1.905 m)   Wt 212 lb 4.9 oz (96.3 kg)   SpO2 97%   BMI 26.54 kg/m   HEMODYNAMICS:    VENTILATOR SETTINGS: FiO2 (%):  [70 %] 70 %  INTAKE / OUTPUT: I/O last 3 completed shifts: In: 1215.6 [P.O.:240; I.V.:775.6; IV Piggyback:200] Out: 3230 [Urine:3230]  PHYSICAL EXAMINATION:  General:  Resting comfortably in bed HENT: NCAT OP clear PULM: Crackles bases B, normal effort CV: RRR, no mgr GI: BS+, soft, nontender MSK: normal bulk and tone Neuro: awake, alert, no distress, MAEW     LABS:  BMET Recent Labs  Lab 12/24/17 0923 12/24/17 1716 12/25/17 0334  NA 145 141 144  K 3.8 3.8 3.6  CL 110 111 112*  CO2 20* 21* 24  BUN 33* 37* 36*  CREATININE 1.53* 1.69* 1.52*  GLUCOSE 133* 173* 137*    Electrolytes Recent Labs  Lab 12/23/17 2205  12/24/17 0923 12/24/17 1716 12/25/17 0334  CALCIUM 8.9   < > 9.2 8.9 9.0  MG 2.0  --  2.0  --   --   PHOS 4.1  --   --   --   --    < > = values in this interval not displayed.    CBC Recent Labs  Lab 12/23/17 0813 12/23/17 0845 12/23/17 2205 12/24/17 0342  WBC 4.3  --  33.5* 28.5*  HGB 12.1* 12.2* 11.5* 10.5*  HCT 39.6 36.0* 39.0 34.1*  PLT 235  --  217 193    Coag's No results for input(s): APTT, INR in the last 168 hours.  Sepsis Markers Recent Labs  Lab 12/23/17 1204 12/23/17 2205 12/23/17 2355 12/24/17 0342 12/25/17 0334  LATICACIDVEN 2.6* 5.6* 4.0*  --   --    PROCALCITON  --   --  44.53 44.22 32.00    ABG Recent Labs  Lab 12/23/17 1054 12/23/17 2340  PHART 7.440 7.440  PCO2ART 29.4* 26.8*  PO2ART 104.0 73.5*    Liver Enzymes Recent Labs  Lab 12/23/17 0813 12/24/17 0342  AST 31 145*  ALT 28 125*  ALKPHOS 85 90  BILITOT 0.8 0.8  ALBUMIN 3.1* 2.5*    Cardiac Enzymes Recent Labs  Lab 12/23/17 2205 12/24/17 0342 12/24/17 0853  TROPONINI 1.55* 1.55* 1.18*    Glucose Recent Labs  Lab 12/24/17 1228 12/24/17 1616 12/24/17 1959 12/24/17 2329 12/25/17 0419 12/25/17 0835  GLUCAP 147* 134* 152* 127* 139* 169*    Imaging Dg Chest Port 1 View  Result Date: 12/25/2017 CLINICAL DATA:  Respiratory failure/hypoxia EXAM: PORTABLE CHEST 1 VIEW COMPARISON:  December 23, 2017 FINDINGS: Asymmetric interstitial edema remains on the right. Left lung is essentially clear. There is stable cardiomegaly with pulmonary vascularity normal. There is aortic atherosclerosis. No adenopathy. Pacemaker leads are attached to the right atrium, right ventricle, and coronary sinus. There is a total shoulder replacement on the right. IMPRESSION: Persistent  asymmetric interstitial edema on the right. No frank consolidation. Left lung clear. Stable cardiac prominence. There is aortic atherosclerosis. Stable pacemaker lead positioning. Aortic Atherosclerosis (ICD10-I70.0). Electronically Signed   By: Lowella Grip III M.D.   On: 12/25/2017 07:11     STUDIES:  2018 Echo> LVEF 20-25%, mild to moderate AS, mild MR, LAE, RAE  CULTURES: 7/5 urine > GNR 7/5 Blood > e coli  ANTIBIOTICS: 7/5 zosy > x1 7/5 vanc x1 7/5 ceftriaxone > 7/7 7/7 zosyn >   SIGNIFICANT EVENTS: 7/5 late PM VT arrest, CPR, no ventilation 7/6 late PM zosyn  LINES/TUBES:   DISCUSSION: 84 y/o male with urinary sepsis causing e coli bacteremia received crystalloid resuscitation and developed acute pulmonary edema and a VT arrest late in the evening of 7/5.  Had torsades type rhythm  abnormality.  He had some aspiraiton   ASSESSMENT / PLAN:  PULMONARY A: Acute respiratory failure with hypoxemia Acute pulmonary edema  Aspiration pneumonitis P:   SLP evaluation today to assess for dysphagia Diuresis again today Continue high flow O2 to maintain O2 saturation > 90%  CARDIOVASCULAR A:  Acute on chronic systolic heart failure Afib VT arrest Aortic stenosis P:  Tele Continue lidocaine per EP Metoprolol 25 mg qHS F/u echo result   RENAL A:   Acute on chronic renal failure, baseline Cr 1.1-1.4 P:   Monitor BMET and UOP Replace electrolytes as needed Continue lasix  GASTROINTESTINAL A:   Dysphagia P:   NPO until SLP evaluation  HEMATOLOGIC A:   Anemia without bleeding P:  Monitor for bleeding Transfuse PRBC for Hgb < 7 gm/dL   INFECTIOUS A:   E coli bacteremia from UTI Aspiration pneumonitis P:   Change ceftriaxone to zosyn  ID consult  ENDOCRINE A:   Hyperglycemia P:   SSI  NEUROLOGIC A:   No acute issues P:   Minimize sedating medicine  FAMILY  - Updates: wife, son updated bedside  - Inter-disciplinary family meet or Palliative Care meeting due by:  Day 7  This patient is critically ill and needs to be monitored in the ICU given his significant risk of death from respiratory failure and cardiac failure form acute on chronic systolic heart failure and septic shock.  My cc time 35 minutes  Roselie Awkward, MD [place] [facility] Pager: [phone] Cell: [phone] After 3pm or if no response, call [phone]   12/25/2017, 9:12 AM

## 2017-12-25 NOTE — Evaluation (Addendum)
Clinical/Bedside Swallow Evaluation Patient Details  Name: Christopher Baker MRN: [id number] Date of Birth: 12-09-1933  Today's Date: 12/25/2017 Time: SLP Start Time (ACUTE ONLY): 7510 SLP Stop Time (ACUTE ONLY): 1452 SLP Time Calculation (min) (ACUTE ONLY): 21 min  Past Medical History:  Past Medical History:  Diagnosis Date  . Anxiety   . Arthritis    "hands, right hip; lower back" (02/28/2017)  . Arthritis with psoriasis ([place])   . Borderline diabetes    DIET CONTROLLED - PT STATES HE IS NOT DIABETIC  . Carotid stenosis, bilateral PER DR EARLY / DUPLEX  09-09-10     40 - 59%   BILATERALLY--- ASYMPTOMATIC  . Chronic lower back pain   . Depression   . Dysrhythmia    a-fib  . GERD (gastroesophageal reflux disease)    CONTROLLED W/ PROTONIX  . History of gout   . HTN (hypertension)   . Hyperlipidemia   . Iron deficiency   . Lumbar spondylosis W/ RADICULOPATHY  . OSA on CPAP   . PAF (paroxysmal atrial fibrillation) ([place])   . Presence of permanent cardiac pacemaker DDD-  06-04-10-   DDD; SECONDARY TO SYNCOPY AND BRADYCARDIA  . Prostate cancer ([place]) 2008   S/P 53 RADIATION  [place]    . Restless leg syndrome   . Stroke [facility]) Oct. 14, 2014   light left hand, balance issues, short term memory loss 2014  . SVT (supraventricular tachycardia) ([place])    S/P ABLATION   2008   Past Surgical History:  Past Surgical History:  Procedure Laterality Date  . BACK SURGERY    . BIV UPGRADE N/A 09/07/2017   Procedure: BIV PACEMAKER UPGRADE;  Surgeon: Evans Lance, MD;  Location: [facility];  Service: Cardiovascular;  Laterality: N/A;  . BREAST SURGERY     "removed tumor; don't remember which side; years ago" (02/28/2017)  . CARDIAC ELECTROPHYSIOLOGY STUDY AND ABLATION  2008   FOR SVT  . CARDIAC PACEMAKER PLACEMENT  06-04-10   DDD  . CARDIOVERSION N/A 03/02/2017   Procedure: CARDIOVERSION;  Surgeon: Josue Hector, MD;  Location: [facility] ENDOSCOPY;  Service: Cardiovascular;  Laterality: N/A;   . CATARACT EXTRACTION W/ INTRAOCULAR LENS  IMPLANT, BILATERAL Bilateral   . CHOLECYSTECTOMY  2009  . ESOPHAGOGASTRODUODENOSCOPY N/A 12/12/2014   Procedure: ESOPHAGOGASTRODUODENOSCOPY (EGD);  Surgeon: Wilford Corner, MD;  Location: [facility] ENDOSCOPY;  Service: Endoscopy;  Laterality: N/A;  . INCISION AND DRAINAGE Right 1997   "knee replacement got infected"  . INGUINAL HERNIA REPAIR Left 06/2003    DONE WITH PENILE PROSTESIS SURG.  . INGUINAL HERNIA REPAIR Right 1962  . JOINT REPLACEMENT    . KNEE ARTHROSCOPY  06/02/2011   Procedure: ARTHROSCOPY KNEE;  Surgeon: Gearlean Alf;  Location: [facility];  Service: Orthopedics;  Laterality: Left;  LEFT KNEE ARTHROSCOPY WITH DEBRIDEMENT  . LOOP RECORDER PLACEMENT  01-30-10  . LOOP RECORDER REMOVAL  06/04/2010  . LUMBAR LAMINECTOMY/ DISKECTOMY/ FUSION  05-19-10   L4 - 5  . PENILE PROSTHESIS IMPLANT  06/2003   AND LEFT CORPOROPLASTY /    DONE INGUINAL REPAIR  . PROSTATE BIOPSY  2007  . REVISION TOTAL KNEE ARTHROPLASTY Right 1997  . TOTAL KNEE ARTHROPLASTY  12/20/2011   Procedure: TOTAL KNEE ARTHROPLASTY;  Surgeon: Gearlean Alf, MD;  Location: [facility];  Service: Orthopedics;  Laterality: Left;  . TOTAL KNEE ARTHROPLASTY Right 1997  . TOTAL SHOULDER ARTHROPLASTY Right 06/27/2015   Procedure: REVERSE TOTAL SHOULDER ARTHROPLASTY;  Surgeon:  Netta Cedars, MD;  Location: [facility];  Service: Orthopedics;  Laterality: Right;  . TRANSURETHRAL RESECTION OF BLADDER TUMOR WITH GYRUS (TURBT-GYRUS)  ?2018  . TRANSURETHRAL RESECTION OF PROSTATE  2006   HPI:  BARRY FAIRCLOTH is a 84 y.o. male with medical history significant of paroxysmal A. fib currently has a pacemaker, prior CVA (2440) chronic systolic CHF with an EF of 25%, mild dementia, hypertension, hyperlipidemia admitted with acute on chronic combined systolic and diastolic heart failure, septic with bacteremia and concerns about possible aspiration pna. Pt just ate [place] ice, talking to nurse  and wife and went unresponsive, code blue, shock x 1 with ROSC. CXR Persistent asymmetric interstitial edema on the right. No frank consolidation. Left lung clear. Wife reported pt coughing with eggs this am. and per RN, MD concerned about possible aspiration on CXR.   Assessment / Plan / Recommendation Clinical Impression  Optimal positioning was initially challenging during assessment due to significant back pain. Slowly able to raise head of bed with reverse Trendelenburg option into a safer position for po's. Wife denied prior dysphagia but pt coughed and burping with eggs this am. Pt's alertness, oral-motor status and dentition adequate. Volitional cough weak due to chest pain from defibrilator. No overt s/s aspiration with straw sips water or solid. Duration of assessment shorter due to his continued back pain. Currently HFNC and deconditioning increase aspiration risk. Eructation after po trials and wife reported he burped after every bite/sip this am. He has GERD and ttakes medication. He appears safe to re-initiate po's of regular texture, thin liquids and educated pt and wife importance of upright position and use of positioning features on bed to assist. Will closely follow pt.    SLP Visit Diagnosis: Dysphagia, unspecified (R13.10)    Aspiration Risk  Moderate aspiration risk    Diet Recommendation Regular;Thin liquid   Liquid Administration via: Cup;Straw Medication Administration: Whole meds with liquid Supervision: Patient able to self feed;Full supervision/cueing for compensatory strategies Compensations: Slow rate;Small sips/bites;Minimize environmental distractions Postural Changes: Seated upright at 90 degrees    Other  Recommendations Oral Care Recommendations: Oral care BID   Follow up Recommendations (TBD)      Frequency and Duration min 2x/week  2 weeks       Prognosis Prognosis for Safe Diet Advancement: Good      Swallow Study   General HPI: Christopher Baker  is a 84 y.o. male with medical history significant of paroxysmal A. fib currently has a pacemaker, prior CVA (1027) chronic systolic CHF with an EF of 25%, mild dementia, hypertension, hyperlipidemia admitted with acute on chronic combined systolic and diastolic heart failure, septic with bacteremia and concerns about possible aspiration pna. Pt just ate [place] ice, talking to nurse and wife and went unresponsive, code blue, shock x 1 with ROSC. CXR Persistent asymmetric interstitial edema on the right. No frank consolidation. Left lung clear. Wife reported pt coughing with eggs this am. and per RN, MD concerned about possible aspiration on CXR. Type of Study: Bedside Swallow Evaluation Previous Swallow Assessment: (none) Diet Prior to this Study: NPO Temperature Spikes Noted: No Respiratory Status: Other (comment)(HFNC 70%) History of Recent Intubation: No Behavior/Cognition: Alert;Cooperative;Requires cueing Oral Cavity Assessment: Dry Oral Care Completed by SLP: Yes Oral Cavity - Dentition: Adequate natural dentition Vision: Functional for self-feeding Self-Feeding Abilities: Able to feed self Patient Positioning: Upright in bed Baseline Vocal Quality: Normal Volitional Cough: Weak Volitional Swallow: Able to elicit    Oral/Motor/Sensory Function Overall Oral  Motor/Sensory Function: Within functional limits   Ice Chips Ice chips: Not tested   Thin Liquid Thin Liquid: Impaired Presentation: Cup;Straw Pharyngeal  Phase Impairments: Other (comments)(increased work of breathing)    Nectar Thick Nectar Thick Liquid: Not tested   Honey Thick Honey Thick Liquid: Not tested   Puree Puree: Not tested   Solid   GO   Solid: Within functional limits        Houston Siren 12/25/2017,3:19 PM  Orbie Pyo Colvin Caroli.Ed [employer] [phone]

## 2017-12-25 NOTE — Progress Notes (Signed)
Patient having multiple episodes of tachycardia. Strips saved, printed, placed in chart. EKG performed. Unconfirmed rhythm, however PR interval undetermined with high ventricular rate.   Patient is physically uncomfortable in bed at this time. Tylenol not helping with patients pain. Heat packs applied previous in the day with no resolution of pain.   Cardiology paged regarding patient's HR and history of Afib. Lidocaine patch ordered for patient. Verbal order to give PM metoprolol now. Will continue to monitor patient closely.

## 2017-12-25 NOTE — Progress Notes (Signed)
Progress Note  Patient Name: Christopher Baker Date of Encounter: 12/25/2017  Primary Cardiologist: No primary care provider on file.   Subjective   Shortness of breath is improved with diuresis.  Still significant dyspnea.  Wife has multiple questions concerning the presence of absence of pneumonia, effectiveness of diuresis, whether pacemaker is working appropriately, concerned that he still has PVCs despite being on lidocaine.  He denies chest pain.  Inpatient Medications    Scheduled Meds: . apixaban  2.5 mg Oral BID  . atorvastatin  20 mg Oral Daily  . donepezil  10 mg Oral QHS  . DULoxetine  30 mg Oral QHS  . ferrous sulfate  325 mg Oral BID WC  . folic acid  1 mg Oral QHS  . furosemide  60 mg Intravenous Q6H  . mouth rinse  15 mL Mouth Rinse BID  . metoprolol succinate  25 mg Oral QHS  . pantoprazole  40 mg Oral QHS  . perflutren lipid microspheres (DEFINITY) IV suspension       Continuous Infusions: . sodium chloride    . lidocaine 1 mg/min (12/25/17 0700)  . piperacillin-tazobactam (ZOSYN)  IV 3.375 g (12/25/17 1024)   PRN Meds: sodium chloride, acetaminophen **OR** acetaminophen, oxyCODONE-acetaminophen, perflutren lipid microspheres (DEFINITY) IV suspension   Vital Signs    Vitals:   12/25/17 0500 12/25/17 0600 12/25/17 0700 12/25/17 0826  BP: (!) 117/59 (!) 147/81 130/73   Pulse: 89 (!) 42 93   Resp: 16 (!) 25 18   Temp:    98.9 F (37.2 C)  TempSrc:    Oral  SpO2: 98% 100% 97% 97%  Weight: 212 lb 4.9 oz (96.3 kg)     Height:        Intake/Output Summary (Last 24 hours) at 12/25/2017 1056 Last data filed at 12/25/2017 0700 Gross per 24 hour  Intake 496.3 ml  Output 1855 ml  Net -1358.7 ml   Filed Weights   12/23/17 2222 12/24/17 0400 12/25/17 0500  Weight: 217 lb 2.5 oz (98.5 kg) 216 lb 14.9 oz (98.4 kg) 212 lb 4.9 oz (96.3 kg)    Telemetry    Paced rhythm- Personally Reviewed  ECG    Normal sinus rhythm with by V pacing noted- Personally  Reviewed  Physical Exam  Elderly, critically ill, breathing much more comfortably than yesterday. GEN:  Respiratory distress noted.  Coarse rhonchi. Neck: Elevated JVD Cardiac: RRR, no murmurs, rubs, or gallops.  Respiratory: Clear to auscultation bilaterally. GI: Soft, nontender, non-distended  MS: No edema; No deformity. Neuro:  Nonfocal  Psych: Normal affect   Labs    Chemistry Recent Labs  Lab 12/23/17 0813  12/24/17 0342 12/24/17 0923 12/24/17 1716 12/25/17 0334  NA 142   < > 143 145 141 144  K 3.7   < > 4.4 3.8 3.8 3.6  CL 109   < > 112* 110 111 112*  CO2 22   < > 19* 20* 21* 24  GLUCOSE 127*   < > 163* 133* 173* 137*  BUN 26*   < > 31* 33* 37* 36*  CREATININE 1.37*   < > 1.61* 1.53* 1.69* 1.52*  CALCIUM 9.4   < > 8.9 9.2 8.9 9.0  PROT 6.0*  --  5.4*  --   --   --   ALBUMIN 3.1*  --  2.5*  --   --   --   AST 31  --  145*  --   --   --  ALT 28  --  125*  --   --   --   ALKPHOS 85  --  90  --   --   --   BILITOT 0.8  --  0.8  --   --   --   GFRNONAA 46*   < > 38* 41* 36* 41*  GFRAA 54*   < > 44* 47* 42* 47*  ANIONGAP 11   < > 12 15 9 8    < > = values in this interval not displayed.     Hematology Recent Labs  Lab 12/23/17 2205 12/24/17 0342 12/25/17 0334  WBC 33.5* 28.5* 18.3*  RBC 4.13* 3.72* 3.58*  HGB 11.5* 10.5* 9.9*  HCT 39.0 34.1* 33.1*  MCV 94.4 91.7 92.5  MCH 27.8 28.2 27.7  MCHC 29.5* 30.8 29.9*  RDW 16.2* 16.4* 16.4*  PLT 217 193 163    Cardiac Enzymes Recent Labs  Lab 12/23/17 2205 12/24/17 0342 12/24/17 0853  TROPONINI 1.55* 1.55* 1.18*   No results for input(s): TROPIPOC in the last 168 hours.   BNPNo results for input(s): BNP, PROBNP in the last 168 hours.   DDimer No results for input(s): DDIMER in the last 168 hours.   Radiology    Dg Chest Port 1 View  Result Date: 12/25/2017 CLINICAL DATA:  Respiratory failure/hypoxia EXAM: PORTABLE CHEST 1 VIEW COMPARISON:  December 23, 2017 FINDINGS: Asymmetric interstitial edema remains  on the right. Left lung is essentially clear. There is stable cardiomegaly with pulmonary vascularity normal. There is aortic atherosclerosis. No adenopathy. Pacemaker leads are attached to the right atrium, right ventricle, and coronary sinus. There is a total shoulder replacement on the right. IMPRESSION: Persistent asymmetric interstitial edema on the right. No frank consolidation. Left lung clear. Stable cardiac prominence. There is aortic atherosclerosis. Stable pacemaker lead positioning. Aortic Atherosclerosis (ICD10-I70.0). Electronically Signed   By: Lowella Grip III M.D.   On: 12/25/2017 07:11   Dg Chest Port 1 View  Result Date: 12/23/2017 CLINICAL DATA:  Fluid overload EXAM: PORTABLE CHEST 1 VIEW COMPARISON:  12/23/2017 FINDINGS: Cardiac pacemaker. Cardiac enlargement with pulmonary vascular congestion. Since the prior study, there is increasing airspace and interstitial infiltration in the lungs suggesting progression of pulmonary edema. No pleural effusions. No pneumothorax. Mediastinal contours appear intact. Calcification of the aorta. Degenerative changes in the spine and left shoulder. Postoperative change in the right shoulder. IMPRESSION: Cardiac enlargement with pulmonary vascular congestion. Increasing pulmonary edema since previous study. Electronically Signed   By: Lucienne Capers M.D.   On: 12/23/2017 22:01    Cardiac Studies   No new data  Patient Profile     84 y.o. male male with paroxysmal atrial fibrillation ([place] = 6) on apixiban and dofetilide, mild to moderate aortic stenosis, non-ischemic cardiomyopathy, complete heart block status-post biventricular [employer] pacemaker implantation, who is being seen today for the evaluation of polymorphic ventricular tachycaria arrest at the request of the pulmonary critical care team.    Assessment & Plan    1. Acute on chronic combined systolic and diastolic heart failure: Acute CHF is improved with patient now 1500  cc negative.  Needs additional diuresis but must follow kidney function, electrolytes and BP. 2. Polymorphic VT, resolved.  Currently on IV lidocaine.  Please see Dr. Olin Pia note.  Should be followed by EP starting in a.m. 3. Sepsis with bacteremia -on IV antibiotic therapy. 4. Concern about possible aspiration pneumonia  Improving.  Agree with continued IV diuresis following electrolytes/blood pressure/kidney function.  Continue apixaban.  Will defer nonemergency management of arrhythmia to the EP service who will follow the patient starting tomorrow.      For questions or updates, please contact [place] Please consult [email or web address] for contact info under Cardiology/STEMI.      Signed, Sinclair Grooms, MD  12/25/2017, 10:56 AM

## 2017-12-26 DIAGNOSIS — J690 Pneumonitis due to inhalation of food and vomit: Secondary | ICD-10-CM

## 2017-12-26 DIAGNOSIS — I4891 Unspecified atrial fibrillation: Secondary | ICD-10-CM

## 2017-12-26 DIAGNOSIS — J9601 Acute respiratory failure with hypoxia: Secondary | ICD-10-CM

## 2017-12-26 LAB — GLUCOSE, CAPILLARY
GLUCOSE-CAPILLARY: 122 mg/dL — AB (ref 70–99)
Glucose-Capillary: 121 mg/dL — ABNORMAL HIGH (ref 70–99)
Glucose-Capillary: 124 mg/dL — ABNORMAL HIGH (ref 70–99)
Glucose-Capillary: 123 mg/dL — ABNORMAL HIGH (ref 70–99)
Glucose-Capillary: 134 mg/dL — ABNORMAL HIGH (ref 70–99)

## 2017-12-26 LAB — CBC
HEMATOCRIT: 31.8 % — AB (ref 39.0–52.0)
HEMOGLOBIN: 9.8 g/dL — AB (ref 13.0–17.0)
MCH: 27.8 pg (ref 26.0–34.0)
MCHC: 30.8 g/dL (ref 30.0–36.0)
MCV: 90.3 fL (ref 78.0–100.0)
Platelets: 139 10*3/uL — ABNORMAL LOW (ref 150–400)
RBC: 3.52 MIL/uL — AB (ref 4.22–5.81)
RDW: 16.2 % — AB (ref 11.5–15.5)
WBC: 15.1 10*3/uL — AB (ref 4.0–10.5)

## 2017-12-26 LAB — BASIC METABOLIC PANEL
Anion gap: 9 (ref 5–15)
BUN: 35 mg/dL — ABNORMAL HIGH (ref 8–23)
CO2: 25 mmol/L (ref 22–32)
Calcium: 9 mg/dL (ref 8.9–10.3)
Chloride: 109 mmol/L (ref 98–111)
Creatinine, Ser: 1.49 mg/dL — ABNORMAL HIGH (ref 0.61–1.24)
GFR calc non Af Amer: 42 mL/min — ABNORMAL LOW (ref >60)
GFR, EST AFRICAN AMERICAN: 49 mL/min — AB (ref >60)
Glucose, Bld: 135 mg/dL — ABNORMAL HIGH (ref 70–99)
Potassium: 3.6 mmol/L (ref 3.5–5.1)
SODIUM: 143 mmol/L (ref 135–145)

## 2017-12-26 LAB — POCT I-STAT 3, ART BLOOD GAS (G3+)
ACID-BASE DEFICIT: 4 mmol/L — AB (ref 0.0–2.0)
Bicarbonate: 19.2 mmol/L — ABNORMAL LOW (ref 20.0–28.0)
O2 SAT: 95 %
PH ART: 7.447 (ref 7.350–7.450)
Patient temperature: 99.6
TCO2: 20 mmol/L — ABNORMAL LOW (ref 22–32)
pCO2 arterial: 28 mmHg — ABNORMAL LOW (ref 32.0–48.0)
pO2, Arterial: 73 mmHg — ABNORMAL LOW (ref 83.0–108.0)

## 2017-12-26 LAB — LIDOCAINE LEVEL: LIDOCAINE LVL: 3.6 ug/mL (ref 1.5–5.0)

## 2017-12-26 MED ORDER — SODIUM CHLORIDE 0.9 % IV SOLN
2.0000 g | INTRAVENOUS | Status: DC
  Administered 2017-12-26 – 2017-12-29 (×4): 2 g via INTRAVENOUS
  Filled 2017-12-26: qty 2
  Filled 2017-12-26 (×3): qty 20

## 2017-12-26 MED ORDER — POTASSIUM CHLORIDE 20 MEQ PO PACK
20.0000 meq | PACK | Freq: Once | ORAL | Status: AC
  Administered 2017-12-26: 20 meq via ORAL
  Filled 2017-12-26: qty 1

## 2017-12-26 MED ORDER — SODIUM CHLORIDE 0.9 % IV SOLN: 1.0000 g | INTRAVENOUS | Status: DC

## 2017-12-26 MED ORDER — METOPROLOL TARTRATE 5 MG/5ML IV SOLN
5.0000 mg | Freq: Once | INTRAVENOUS | Status: AC
  Administered 2017-12-26: 2.5 mg via INTRAVENOUS
  Filled 2017-12-26: qty 5

## 2017-12-26 NOTE — Progress Notes (Signed)
  Speech Language Pathology Treatment: Dysphagia  Patient Details Name: Christopher Baker MRN: [id number] DOB: 1934-01-11 Today's Date: 12/26/2017 Time: [id number] SLP Time Calculation (min) (ACUTE ONLY): 8 min  Assessment / Plan / Recommendation Clinical Impression  Pt able to sit upright with slightly less pain. Just completed lunch but agreeable to additional bite solid and thin via cup. [name] and another family member who has assisted during meals denied coughing. Swallow and respiratory reciprocity adequate without dyspnea or s/s of airway compromise. Reiterated importance of continuing upright position. Continue regular texture, thin liquids and ST to sign off. Pt and wife in agreement.     HPI HPI: Christopher Baker is a 84 y.o. male with medical history significant of paroxysmal A. fib currently has a pacemaker, prior CVA (2924) chronic systolic CHF with an EF of 25%, mild dementia, hypertension, hyperlipidemia admitted with acute on chronic combined systolic and diastolic heart failure, septic with bacteremia and concerns about possible aspiration pna. Pt just ate [place] ice, talking to nurse and wife and went unresponsive, code blue, shock x 1 with ROSC. CXR Persistent asymmetric interstitial edema on the right. No frank consolidation. Left lung clear. Wife reported pt coughing with eggs this am. and per RN, MD concerned about possible aspiration on CXR.      SLP Plan  All goals met;Discharge SLP treatment due to (comment)       Recommendations  Diet recommendations: Regular;Thin liquid Liquids provided via: Cup;Straw Medication Administration: Whole meds with liquid Supervision: Patient able to self feed Compensations: Slow rate;Small sips/bites;Minimize environmental distractions Postural Changes and/or Swallow Maneuvers: Seated upright 90 degrees;Upright 30-60 min after meal                Oral Care Recommendations: Oral care BID Follow up Recommendations: None SLP Visit  Diagnosis: Dysphagia, unspecified (R13.10) Plan: All goals met;Discharge SLP treatment due to (comment)                      Christopher Baker 12/26/2017, 1:22 PM   Christopher Baker.Ed [employer] [phone]

## 2017-12-26 NOTE — Progress Notes (Signed)
0920- patient experiencing episode of severe shivering.  Temperature 99.6.  Heart rate increasing up to 170's.  Blankets applied, heating packs, and temperature in room increased.  MD Lovena Le at bedside rounding and spoke with family.  MD Aljishi to bedside at 0945 and new orders received.  Patient's shivering stopped at 0950.

## 2017-12-26 NOTE — Progress Notes (Signed)
[facility] for Infectious Disease  Date of Admission:  12/23/2017   Total days of antibiotics 4        Day 2 of Pip Tazo                 ASSESSMENT: Christopher Baker is a 84 y/o male with a PMH of prostate cancer s/p TURBT in 2018, A.fibrillation, chronic systolic CHF with EF of 40%-81% with bivent pacemaker, prior CVA (2014) and non-ischemic cardiomyopathy. In April 2019, he underwent clot evacuation with fulgaration by Dr. Amalia Hailey for gross hematuria secondary to radiation cystitis and chronic anti-coag use. Christopher Baker has a recent history of urosepsis with E.coli bacteremia in May 2019. He was discharged with oral Cefdinir and completed a 12 day course with resolution of infection. Repeat urine culture on 5/22 was negative.   Christopher Baker presented on 12/23/2017 with signs of sepsis (fever to 104 with hypotension) and blood cultures grew E.coli once again; urine culture also grew E. Coli. On the night of admission, he had pulseless V-tach and was resuscitated. He received Vancomycin, Ceftriaxone and Pip Tazo on 7/05 and only Ceftriaxone on 7/06. There are also concerns for aspiration pneumonia due to choking/coughing event on 12/25/17, which prompted antibiotics to be changed from Ceftriaxone to Pip Tazo for both anaerobic and aerobic coverage. Chest xray afterwards showed asymmetric interstitial edema on the right. No consolidation. Repeat cultures are pending and are currently no growth to date. Christopher Baker is having gross hematuria again, which is likely secondary to radiation cystitis.   Since concern for pneumonia is decreasing, switch to Ceftriaxone for more narrow coverage. If growth reoccurs, recommend TEE and a longer course of treatment.    PLAN: 1. Switch to IV Ceftriaxone    Principal Problem:   Ventricular tachycardia (HCC) Active Problems:   Sepsis ([place])   Pressure injury of skin   E coli bacteremia   Urinary tract infection   Acute pulmonary edema (HCC)  Drug-induced torsades de pointes   Scheduled Meds: . apixaban  2.5 mg Oral BID  . atorvastatin  20 mg Oral Daily  . donepezil  10 mg Oral QHS  . DULoxetine  30 mg Oral QHS  . ferrous sulfate  325 mg Oral BID WC  . folic acid  1 mg Oral QHS  . lidocaine  1 patch Transdermal Q24H  . mouth rinse  15 mL Mouth Rinse BID  . metoprolol succinate  25 mg Oral QHS  . pantoprazole  40 mg Oral QHS   Continuous Infusions: . sodium chloride    . lidocaine Stopped (12/26/17 0656)  . piperacillin-tazobactam (ZOSYN)  IV 12.5 mL/hr at 12/26/17 0700   PRN Meds:.sodium chloride, acetaminophen **OR** acetaminophen, oxyCODONE-acetaminophen   SUBJECTIVE: Christopher Baker states that he is feeling okay today. He denies SOB, cough, chest pain and abdominal pain.   Wife and children at bedside note that Christopher Baker had hematuria again today, which has not occurred since admission on 7/05. They are concerned about this. Additionally, they would like to discuss prophylaxis to avoid future hospitalizations for E.coli urosepsis.   Review of Systems: Review of Systems  Constitutional: Positive for chills (In the AM. Resolved. ). Negative for fever.  Respiratory: Negative for cough and shortness of breath.   Cardiovascular: Negative for chest pain.  Gastrointestinal: Negative for abdominal pain, nausea and vomiting.    Allergies  Allergen Reactions  . Ciprofloxacin Nausea Only and Other (See Comments)  Stomach ache  . Hydrocodone-Acetaminophen Other (See Comments)    Hallucinations in higher doses  . Other Other (See Comments)    Rash from neoprene wrap after knee surgery  . Shellfish Allergy Nausea And Vomiting and Other (See Comments)    Reaction to scallops  . Tramadol Other (See Comments)    Pt becomes combative per wife    OBJECTIVE: Vitals:   12/26/17 0700 12/26/17 0725 12/26/17 0813 12/26/17 0826  BP:  129/68    Pulse: (!) 52 75  94  Resp: (!) 24 (!) 31  (!) 33  Temp:   99.3 F (37.4 C)    TempSrc:   Oral   SpO2: 100% 100%  99%  Weight:      Height:       Body mass index is 26.07 kg/m.  Physical Exam  Constitutional: He appears lethargic. He is cooperative. He is easily aroused. He has a sickly appearance. He appears ill. No distress. Nasal cannula in place.  Cardiovascular: Normal rate. An irregularly irregular rhythm present.  No murmur heard. Rate of 95 during exam.   Pulmonary/Chest: No respiratory distress.  Abdominal: Bowel sounds are normal.  Musculoskeletal: He exhibits edema (Bilateral lower extremity. ).  Neurological: He is easily aroused. He appears lethargic.    Lab Results Lab Results  Component Value Date   WBC 15.1 (H) 12/26/2017   HGB 9.8 (L) 12/26/2017   HCT 31.8 (L) 12/26/2017   MCV 90.3 12/26/2017   PLT 139 (L) 12/26/2017    Lab Results  Component Value Date   CREATININE 1.49 (H) 12/26/2017   BUN 35 (H) 12/26/2017   NA 143 12/26/2017   K 3.6 12/26/2017   CL 109 12/26/2017   CO2 25 12/26/2017    Lab Results  Component Value Date   ALT 125 (H) 12/24/2017   AST 145 (H) 12/24/2017   ALKPHOS 90 12/24/2017   BILITOT 0.8 12/24/2017     Microbiology: Recent Results (from the past 240 hour(s))  Blood Culture (routine x 2)     Status: Abnormal   Collection Time: 12/23/17  8:13 AM  Result Value Ref Range Status   Specimen Description BLOOD RIGHT ANTECUBITAL  Final   Special Requests   Final    BOTTLES DRAWN AEROBIC AND ANAEROBIC Blood Culture adequate volume   Culture  Setup Time   Final    GRAM NEGATIVE RODS IN BOTH AEROBIC AND ANAEROBIC BOTTLES CRITICAL RESULT CALLED TO, READ BACK BY AND VERIFIED WITHFerne Coe [facility] 2126 12/23/17 A BROWNING Performed at [facility], [place] [address], [place], [place] [address]    Culture ESCHERICHIA COLI (A)  Final   Report Status 12/25/2017 FINAL  Final   Organism ID, Bacteria ESCHERICHIA COLI  Final      Susceptibility   Escherichia coli - MIC*    AMPICILLIN >=32 RESISTANT Resistant       CEFAZOLIN <=4 SENSITIVE Sensitive     CEFEPIME <=1 SENSITIVE Sensitive     CEFTAZIDIME <=1 SENSITIVE Sensitive     CEFTRIAXONE <=1 SENSITIVE Sensitive     CIPROFLOXACIN <=0.25 SENSITIVE Sensitive     GENTAMICIN <=1 SENSITIVE Sensitive     IMIPENEM <=0.25 SENSITIVE Sensitive     TRIMETH/SULFA <=20 SENSITIVE Sensitive     AMPICILLIN/SULBACTAM >=32 RESISTANT Resistant     PIP/TAZO <=4 SENSITIVE Sensitive     Extended ESBL NEGATIVE Sensitive     * ESCHERICHIA COLI  Blood Culture ID Panel (Reflexed)     Status:  Abnormal   Collection Time: 12/23/17  8:13 AM  Result Value Ref Range Status   Enterococcus species NOT DETECTED NOT DETECTED Final   Listeria monocytogenes NOT DETECTED NOT DETECTED Final   Staphylococcus species NOT DETECTED NOT DETECTED Final   Staphylococcus aureus NOT DETECTED NOT DETECTED Final   Streptococcus species NOT DETECTED NOT DETECTED Final   Streptococcus agalactiae NOT DETECTED NOT DETECTED Final   Streptococcus pneumoniae NOT DETECTED NOT DETECTED Final   Streptococcus pyogenes NOT DETECTED NOT DETECTED Final   Acinetobacter baumannii NOT DETECTED NOT DETECTED Final   Enterobacteriaceae species DETECTED (A) NOT DETECTED Final    Comment: Enterobacteriaceae represent a large family of gram-negative bacteria, not a single organism. CRITICAL RESULT CALLED TO, READ BACK BY AND VERIFIED WITH: E MARTIN PHARMD 2126 12/23/17 A BROWNING    Enterobacter cloacae complex NOT DETECTED NOT DETECTED Final   Escherichia coli DETECTED (A) NOT DETECTED Final    Comment: CRITICAL RESULT CALLED TO, READ BACK BY AND VERIFIED WITH: E MARTIN PHARMD 2126 12/23/17 A BROWNING    Klebsiella oxytoca NOT DETECTED NOT DETECTED Final   Klebsiella pneumoniae NOT DETECTED NOT DETECTED Final   Proteus species NOT DETECTED NOT DETECTED Final   Serratia marcescens NOT DETECTED NOT DETECTED Final   Carbapenem resistance NOT DETECTED NOT DETECTED Final   Haemophilus influenzae NOT DETECTED NOT  DETECTED Final   Neisseria meningitidis NOT DETECTED NOT DETECTED Final   Pseudomonas aeruginosa NOT DETECTED NOT DETECTED Final   Candida albicans NOT DETECTED NOT DETECTED Final   Candida glabrata NOT DETECTED NOT DETECTED Final   Candida krusei NOT DETECTED NOT DETECTED Final   Candida parapsilosis NOT DETECTED NOT DETECTED Final   Candida tropicalis NOT DETECTED NOT DETECTED Final    Comment: Performed at [facility], [place] [address], [place], [place] [address]  Blood Culture (routine x 2)     Status: Abnormal   Collection Time: 12/23/17  8:30 AM  Result Value Ref Range Status   Specimen Description BLOOD LEFT HAND  Final   Special Requests   Final    BOTTLES DRAWN AEROBIC AND ANAEROBIC Blood Culture results may not be optimal due to an inadequate volume of blood received in culture bottles   Culture  Setup Time   Final    GRAM NEGATIVE RODS IN BOTH AEROBIC AND ANAEROBIC BOTTLES CRITICAL VALUE NOTED.  VALUE IS CONSISTENT WITH PREVIOUSLY REPORTED AND CALLED VALUE.    Culture (A)  Final    ESCHERICHIA COLI SUSCEPTIBILITIES PERFORMED ON PREVIOUS CULTURE WITHIN THE LAST 5 DAYS. Performed at [facility], [place] [address], [place], [place] [address]    Report Status 12/25/2017 FINAL  Final  Urine culture     Status: Abnormal   Collection Time: 12/23/17  8:44 AM  Result Value Ref Range Status   Specimen Description URINE, CATHETERIZED  Final   Special Requests   Final    NONE Performed at [facility], [place] [address] [place], [place] [address]    Culture >=100,000 COLONIES/mL ESCHERICHIA COLI (A)  Final   Report Status 12/25/2017 FINAL  Final   Organism ID, Bacteria ESCHERICHIA COLI (A)  Final      Susceptibility   Escherichia coli - MIC*    AMPICILLIN >=32 RESISTANT Resistant     CEFAZOLIN 16 SENSITIVE Sensitive     CEFTRIAXONE <=1 SENSITIVE Sensitive     CIPROFLOXACIN <=0.25 SENSITIVE Sensitive     GENTAMICIN <=1 SENSITIVE Sensitive  IMIPENEM <=0.25  SENSITIVE Sensitive     NITROFURANTOIN <=16 SENSITIVE Sensitive     TRIMETH/SULFA <=20 SENSITIVE Sensitive     AMPICILLIN/SULBACTAM >=32 RESISTANT Resistant     PIP/TAZO <=4 SENSITIVE Sensitive     Extended ESBL NEGATIVE Sensitive     * >=100,000 COLONIES/mL ESCHERICHIA COLI  MRSA PCR Screening     Status: None   Collection Time: 12/23/17  9:06 PM  Result Value Ref Range Status   MRSA by PCR NEGATIVE NEGATIVE Final    Comment:        The GeneXpert MRSA Assay (FDA approved for NASAL specimens only), is one component of a comprehensive MRSA colonization surveillance program. It is not intended to diagnose MRSA infection nor to guide or monitor treatment for MRSA infections. Performed at [facility], [place] [address], [place], [place] [address]   Culture, blood (Routine X 2) w Reflex to ID Panel     Status: None (Preliminary result)   Collection Time: 12/24/17 12:15 PM  Result Value Ref Range Status   Specimen Description BLOOD BLOOD LEFT HAND  Final   Special Requests   Final    BOTTLES DRAWN AEROBIC ONLY Blood Culture results may not be optimal due to an inadequate volume of blood received in culture bottles   Culture   Final    NO GROWTH < 24 HOURS Performed at [facility], [place] [address] [place], [place] [address]    Report Status PENDING  Incomplete  Culture, blood (Routine X 2) w Reflex to ID Panel     Status: None (Preliminary result)   Collection Time: 12/24/17 12:15 PM  Result Value Ref Range Status   Specimen Description BLOOD BLOOD RIGHT HAND  Final   Special Requests   Final    BOTTLES DRAWN AEROBIC AND ANAEROBIC Blood Culture adequate volume   Culture   Final    NO GROWTH < 24 HOURS Performed at [facility], [place] [address], [place], [place] [address]    Report Status PENDING  Incomplete    Jose Persia, [facility] for Infectious [facility] 12/26/2017, 9:25 AM

## 2017-12-26 NOTE — Progress Notes (Signed)
PULMONARY / CRITICAL CARE MEDICINE   Name: Christopher Baker MRN: [id number] DOB: 08/08/1933    ADMISSION DATE:  12/23/2017  REFERRING MD:  Renne Crigler  CHIEF COMPLAINT:  VT arrest  HISTORY OF PRESENT ILLNESS:   84 y/o male with a past medical history of CHF was admitted with sepsis, had a cardiac arrest afterwards.    SUBJECTIVE:  Looks very ill lethargic in afib with RVR HR 160. Wife and daughter at bedside.   VITAL SIGNS: BP 119/78   Pulse (!) 103   Temp 99.6 F (37.6 C) (Oral)   Resp (!) 30   Ht 6\' 3"  (1.905 m)   Wt 94.6 kg (208 lb 8.9 oz)   SpO2 95%   BMI 26.07 kg/m   HEMODYNAMICS:    VENTILATOR SETTINGS: FiO2 (%):  [60 %-70 %] 60 %  INTAKE / OUTPUT: I/O last 3 completed shifts: In: 864.8 [P.O.:60; I.V.:513.6; IV Piggyback:291.2] Out: 4980 [Urine:4980]  PHYSICAL EXAMINATION:  General: looks toxic acutely ill  HENT: NCAT OP clear PULM: Crackles bases B, normal effort CV: irregular tachycardic  GI: BS+, soft, nontender MSK: normal bulk and tone Neuro: lethargic mumbling words      LABS:  BMET Recent Labs  Lab 12/24/17 1716 12/25/17 0334 12/26/17 0314  NA 141 144 143  K 3.8 3.6 3.6  CL 111 112* 109  CO2 21* 24 25  BUN 37* 36* 35*  CREATININE 1.69* 1.52* 1.49*  GLUCOSE 173* 137* 135*    Electrolytes Recent Labs  Lab 12/23/17 2205  12/24/17 0923 12/24/17 1716 12/25/17 0334 12/26/17 0314  CALCIUM 8.9   < > 9.2 8.9 9.0 9.0  MG 2.0  --  2.0  --  2.1  --   PHOS 4.1  --   --   --   --   --    < > = values in this interval not displayed.    CBC Recent Labs  Lab 12/24/17 0342 12/25/17 0334 12/26/17 0314  WBC 28.5* 18.3* 15.1*  HGB 10.5* 9.9* 9.8*  HCT 34.1* 33.1* 31.8*  PLT 193 163 139*    Coag's No results for input(s): APTT, INR in the last 168 hours.  Sepsis Markers Recent Labs  Lab 12/23/17 1204 12/23/17 2205 12/23/17 2355 12/24/17 0342 12/25/17 0334  LATICACIDVEN 2.6* 5.6* 4.0*  --   --   PROCALCITON  --   --  44.53  44.22 32.00    ABG Recent Labs  Lab 12/23/17 1054 12/23/17 2340 12/26/17 1009  PHART 7.440 7.440 7.447  PCO2ART 29.4* 26.8* 28.0*  PO2ART 104.0 73.5* 73.0*    Liver Enzymes Recent Labs  Lab 12/23/17 0813 12/24/17 0342  AST 31 145*  ALT 28 125*  ALKPHOS 85 90  BILITOT 0.8 0.8  ALBUMIN 3.1* 2.5*    Cardiac Enzymes Recent Labs  Lab 12/23/17 2205 12/24/17 0342 12/24/17 0853  TROPONINI 1.55* 1.55* 1.18*    Glucose Recent Labs  Lab 12/25/17 1242 12/25/17 1646 12/25/17 2031 12/25/17 2334 12/26/17 0311 12/26/17 0809  GLUCAP 148* 138* 141* 124* 121* 124*    Imaging No results found.   STUDIES:  2018 Echo> LVEF 20-25%, mild to moderate AS, mild MR, LAE, RAE  CULTURES: 7/5 urine > GNR 7/5 Blood > e coli  ANTIBIOTICS: 7/5 zosy > x1 7/5 vanc x1 7/5 ceftriaxone > 7/7 7/7 zosyn >   SIGNIFICANT EVENTS: 7/5 late PM VT arrest, CPR, no ventilation 7/6 late PM zosyn  LINES/TUBES:   DISCUSSION: 84 y/o male with urinary  sepsis causing e coli bacteremia received crystalloid resuscitation and developed acute pulmonary edema and a VT arrest late in the evening of 7/5.  Had torsades type rhythm abnormality.  He had some aspiraiton   ASSESSMENT / PLAN:  PULMONARY A: Acute respiratory failure with hypoxemia Acute pulmonary edema  Aspiration pneumonitis P:   Wean O2 as tolerated  Continue high flow O2 to maintain O2 saturation > 90%  CARDIOVASCULAR A:  Acute on chronic systolic heart failure Afib with RVR  VT arrest Aortic stenosis P:  Tele Stat IV metoprolol 2.5mg   D/c lidocaine drip Appreciate cards follow up  Echo 25-30%    RENAL A:   Acute on chronic renal failure, baseline Cr 1.1-1.4 P:   Monitor BMET and UOP Replace electrolytes as needed Will hold off lasix today   GASTROINTESTINAL A:   Dysphagia P:   NPO until SLP evaluation  HEMATOLOGIC A:   Anemia without bleeding P:  Monitor for bleeding Transfuse PRBC for Hgb < 7  gm/dL   INFECTIOUS A:   E coli bacteremia from UTI Aspiration pneumonitis P:   Continue zosyn for asp PNA  ID consult  ENDOCRINE A:   Hyperglycemia P:   SSI  NEUROLOGIC A:   No acute issues P:   Minimize sedating medicine  FAMILY  - Updates: 7/8 wife, daughter updated bedside discussed goals of care extensively and now patient is NO CPR but ok for intubation   - Inter-disciplinary family meet or Palliative Care meeting due by:  Day 7  This patient is critically ill and needs to be monitored in the ICU given his significant risk of death from respiratory failure and cardiac failure form acute on chronic systolic heart failure and septic shock.  My cc time 45 minutes  Silver Huguenin , MD [facility] [phone]   12/26/2017, 11:14 AM

## 2017-12-26 NOTE — Progress Notes (Addendum)
Electrophysiology Rounding Note  Patient Name: Christopher Baker Date of Encounter: 12/26/2017  Electrophysiologist: Lovena Le   Subjective   The patient remains ill.  He has developed chills this morning with AF with RVR. Wife and daughter at bedside. He states that his back hurts.   Inpatient Medications    Scheduled Meds: . apixaban  2.5 mg Oral BID  . atorvastatin  20 mg Oral Daily  . donepezil  10 mg Oral QHS  . DULoxetine  30 mg Oral QHS  . ferrous sulfate  325 mg Oral BID WC  . folic acid  1 mg Oral QHS  . lidocaine  1 patch Transdermal Q24H  . mouth rinse  15 mL Mouth Rinse BID  . metoprolol succinate  25 mg Oral QHS  . metoprolol tartrate  5 mg Intravenous Once  . pantoprazole  40 mg Oral QHS   Continuous Infusions: . sodium chloride    . lidocaine Stopped (12/26/17 0656)  . piperacillin-tazobactam (ZOSYN)  IV 12.5 mL/hr at 12/26/17 0700   PRN Meds: sodium chloride, acetaminophen **OR** acetaminophen, oxyCODONE-acetaminophen   Vital Signs    Vitals:   12/26/17 0930 12/26/17 0933 12/26/17 0940 12/26/17 0941  BP:    134/78  Pulse:  (!) 171 (!) 173 (!) 170  Resp: (!) 34 (!) 32 (!) 30 (!) 32  Temp:      TempSrc:      SpO2:  91% 95% 95%  Weight:      Height:        Intake/Output Summary (Last 24 hours) at 12/26/2017 0948 Last data filed at 12/26/2017 0900 Gross per 24 hour  Intake 518.39 ml  Output 4185 ml  Net -3666.61 ml   Filed Weights   12/24/17 0400 12/25/17 0500 12/26/17 0600  Weight: 216 lb 14.9 oz (98.4 kg) 212 lb 4.9 oz (96.3 kg) 208 lb 8.9 oz (94.6 kg)    Physical Exam    GEN- The patient is acutely ill appearing alert and oriented x 3 today.   Head- normocephalic, atraumatic Eyes-  Sclera clear, conjunctiva pink Ears- hearing intact Oropharynx- clear Neck- supple Lungs- Clear to ausculation bilaterally, normal work of breathing Heart- Tachycardic irregular rate and rhythm  GI- soft, NT, ND, + BS Extremities- no clubbing, cyanosis, or  edema Skin- no rash or lesion Psych- euthymic mood, full affect Neuro- strength and sensation are intact  Labs    CBC Recent Labs    12/25/17 0334 12/26/17 0314  WBC 18.3* 15.1*  NEUTROABS 17.2*  --   HGB 9.9* 9.8*  HCT 33.1* 31.8*  MCV 92.5 90.3  PLT 163 785*   Basic Metabolic Panel Recent Labs    12/23/17 2205  12/24/17 0923  12/25/17 0334 12/26/17 0314  NA 143   < > 145   < > 144 143  K 4.5   < > 3.8   < > 3.6 3.6  CL 112*   < > 110   < > 112* 109  CO2 16*   < > 20*   < > 24 25  GLUCOSE 200*   < > 133*   < > 137* 135*  BUN 28*   < > 33*   < > 36* 35*  CREATININE 1.66*   < > 1.53*   < > 1.52* 1.49*  CALCIUM 8.9   < > 9.2   < > 9.0 9.0  MG 2.0  --  2.0  --  2.1  --   PHOS 4.1  --   --   --   --   --    < > =  values in this interval not displayed.   Liver Function Tests Recent Labs    12/24/17 0342  AST 145*  ALT 125*  ALKPHOS 90  BILITOT 0.8  PROT 5.4*  ALBUMIN 2.5*   No results for input(s): LIPASE, AMYLASE in the last 72 hours. Cardiac Enzymes Recent Labs    12/23/17 2205 12/24/17 0342 12/24/17 0853  TROPONINI 1.55* 1.55* 1.18*   Thyroid Function Tests Recent Labs    12/23/17 2355  TSH 1.724    Telemetry    SR with V pacing -> AF with RVR (personally reviewed)  Radiology    Dg Chest Port 1 View  Result Date: 12/25/2017 CLINICAL DATA:  Respiratory failure/hypoxia EXAM: PORTABLE CHEST 1 VIEW COMPARISON:  December 23, 2017 FINDINGS: Asymmetric interstitial edema remains on the right. Left lung is essentially clear. There is stable cardiomegaly with pulmonary vascularity normal. There is aortic atherosclerosis. No adenopathy. Pacemaker leads are attached to the right atrium, right ventricle, and coronary sinus. There is a total shoulder replacement on the right. IMPRESSION: Persistent asymmetric interstitial edema on the right. No frank consolidation. Left lung clear. Stable cardiac prominence. There is aortic atherosclerosis. Stable pacemaker lead  positioning. Aortic Atherosclerosis (ICD10-I70.0). Electronically Signed   By: Lowella Grip III M.D.   On: 12/25/2017 07:11    Assessment & Plan    1.  Torsades In the setting of septic shock and Tikosyn use No recurrence  Will stop Lidocaine today Keep K>3.9, Mg >1.8 Avoid QT prolonging drugs  2.  Persistent AF with RVR Now off Tikosyn Current rates are in response to infection BP stable Continue BB  3.  E Coli bacteremia Per ID/CCM Not likely to cause device infection    Signed, Chanetta Marshall, NP  12/26/2017, 9:48 AM   EP Attending  Patient seen and examined. Agree with above. The patient remains critically ill but is alert and conversive in between rigors. He is critically ill appearing, and in rapid atrial fib. He has been on IV anti-biotics. He has not had any more sustained Torsades. He will continue to receive supportive care with antibiotics and we will stop his IV lidocaine now that the dofetilide has washed out of his system. He remains critically ill. His rates will improve when his infection gets better.  Mikle Bosworth.D.

## 2017-12-27 ENCOUNTER — Inpatient Hospital Stay (HOSPITAL_COMMUNITY): Payer: PPO

## 2017-12-27 DIAGNOSIS — M545 Low back pain: Secondary | ICD-10-CM

## 2017-12-27 LAB — GLUCOSE, CAPILLARY
GLUCOSE-CAPILLARY: 122 mg/dL — AB (ref 70–99)
GLUCOSE-CAPILLARY: 123 mg/dL — AB (ref 70–99)
Glucose-Capillary: 118 mg/dL — ABNORMAL HIGH (ref 70–99)
Glucose-Capillary: 119 mg/dL — ABNORMAL HIGH (ref 70–99)
Glucose-Capillary: 132 mg/dL — ABNORMAL HIGH (ref 70–99)

## 2017-12-27 LAB — BASIC METABOLIC PANEL
ANION GAP: 16 — AB (ref 5–15)
BUN: 39 mg/dL — ABNORMAL HIGH (ref 8–23)
CHLORIDE: 109 mmol/L (ref 98–111)
CO2: 17 mmol/L — AB (ref 22–32)
Calcium: 9 mg/dL (ref 8.9–10.3)
Creatinine, Ser: 1.57 mg/dL — ABNORMAL HIGH (ref 0.61–1.24)
GFR calc Af Amer: 46 mL/min — ABNORMAL LOW (ref >60)
GFR calc non Af Amer: 39 mL/min — ABNORMAL LOW (ref >60)
Glucose, Bld: 124 mg/dL — ABNORMAL HIGH (ref 70–99)
POTASSIUM: 4 mmol/L (ref 3.5–5.1)
SODIUM: 142 mmol/L (ref 135–145)

## 2017-12-27 LAB — CBC WITH DIFFERENTIAL/PLATELET
ABS IMMATURE GRANULOCYTES: 0.1 10*3/uL (ref 0.0–0.1)
Basophils Absolute: 0 10*3/uL (ref 0.0–0.1)
Basophils Relative: 0 %
EOS PCT: 0 %
Eosinophils Absolute: 0 10*3/uL (ref 0.0–0.7)
HEMATOCRIT: 32.8 % — AB (ref 39.0–52.0)
HEMOGLOBIN: 10.3 g/dL — AB (ref 13.0–17.0)
Immature Granulocytes: 1 %
LYMPHS ABS: 0.5 10*3/uL — AB (ref 0.7–4.0)
LYMPHS PCT: 4 %
MCH: 27.3 pg (ref 26.0–34.0)
MCHC: 31.4 g/dL (ref 30.0–36.0)
MCV: 87 fL (ref 78.0–100.0)
Monocytes Absolute: 1.2 10*3/uL — ABNORMAL HIGH (ref 0.1–1.0)
Monocytes Relative: 10 %
NEUTROS ABS: 10.1 10*3/uL — AB (ref 1.7–7.7)
Neutrophils Relative %: 85 %
Platelets: 131 10*3/uL — ABNORMAL LOW (ref 150–400)
RBC: 3.77 MIL/uL — ABNORMAL LOW (ref 4.22–5.81)
RDW: 16.2 % — ABNORMAL HIGH (ref 11.5–15.5)
WBC: 11.9 10*3/uL — AB (ref 4.0–10.5)

## 2017-12-27 MED ORDER — GERHARDT'S BUTT CREAM
TOPICAL_CREAM | CUTANEOUS | Status: DC | PRN
  Administered 2017-12-27: 1 via TOPICAL
  Administered 2017-12-30: 09:00:00 via TOPICAL
  Filled 2017-12-27: qty 1

## 2017-12-27 MED ORDER — FUROSEMIDE 10 MG/ML IJ SOLN
40.0000 mg | Freq: Every day | INTRAMUSCULAR | Status: DC
  Administered 2017-12-27 – 2017-12-30 (×4): 40 mg via INTRAVENOUS
  Filled 2017-12-27 (×4): qty 4

## 2017-12-27 NOTE — Plan of Care (Signed)
  Problem: Clinical Measurements: Goal: Cardiovascular complication will be avoided Outcome: Progressing   Problem: Coping: Goal: Level of anxiety will decrease Outcome: Progressing   Problem: Elimination: Goal: Will not experience complications related to urinary retention Outcome: Progressing   

## 2017-12-27 NOTE — Progress Notes (Signed)
PULMONARY / CRITICAL CARE MEDICINE   Name: Christopher Baker MRN: [id number] DOB: 1933/07/19    ADMISSION DATE:  12/23/2017  REFERRING MD:  Renne Crigler  CHIEF COMPLAINT:  VT arrest  HISTORY OF PRESENT ILLNESS:   84 y/o male with a past medical history of CHF was admitted with sepsis, had a cardiac arrest afterwards.    SUBJECTIVE:  Though still looking ill he does look much better and abel to carry a conversation. Does complain of shortness of breath. On high flow   VITAL SIGNS: BP (!) 139/55 (BP Location: Left Arm)   Pulse 74   Temp (!) 97.2 F (36.2 C) (Oral)   Resp (!) 21   Ht 6\' 3"  (1.905 m)   Wt 95.1 kg (209 lb 10.5 oz)   SpO2 99%   BMI 26.21 kg/m   HEMODYNAMICS:    VENTILATOR SETTINGS: FiO2 (%):  [30 %-60 %] 30 %  INTAKE / OUTPUT: I/O last 3 completed shifts: In: 641 [P.O.:120; I.V.:335.8; IV Piggyback:185.2] Out: 3081 [Urine:3080; Stool:1]  PHYSICAL EXAMINATION:  General: acutely ill  HENT: NCAT OP clear PULM: Crackles bases B, normal effort CV: no murmurs irregular controlled rat  GI: BS+, soft, nontender MSK: normal bulk and tone Neuro: lethargic mumbling words      LABS:  BMET Recent Labs  Lab 12/25/17 0334 12/26/17 0314 12/27/17 0303  NA 144 143 142  K 3.6 3.6 4.0  CL 112* 109 109  CO2 24 25 17*  BUN 36* 35* 39*  CREATININE 1.52* 1.49* 1.57*  GLUCOSE 137* 135* 124*    Electrolytes Recent Labs  Lab 12/23/17 2205  12/24/17 0923  12/25/17 0334 12/26/17 0314 12/27/17 0303  CALCIUM 8.9   < > 9.2   < > 9.0 9.0 9.0  MG 2.0  --  2.0  --  2.1  --   --   PHOS 4.1  --   --   --   --   --   --    < > = values in this interval not displayed.    CBC Recent Labs  Lab 12/25/17 0334 12/26/17 0314 12/27/17 0303  WBC 18.3* 15.1* 11.9*  HGB 9.9* 9.8* 10.3*  HCT 33.1* 31.8* 32.8*  PLT 163 139* 131*    Coag's No results for input(s): APTT, INR in the last 168 hours.  Sepsis Markers Recent Labs  Lab 12/23/17 1204 12/23/17 2205  12/23/17 2355 12/24/17 0342 12/25/17 0334  LATICACIDVEN 2.6* 5.6* 4.0*  --   --   PROCALCITON  --   --  44.53 44.22 32.00    ABG Recent Labs  Lab 12/23/17 1054 12/23/17 2340 12/26/17 1009  PHART 7.440 7.440 7.447  PCO2ART 29.4* 26.8* 28.0*  PO2ART 104.0 73.5* 73.0*    Liver Enzymes Recent Labs  Lab 12/23/17 0813 12/24/17 0342  AST 31 145*  ALT 28 125*  ALKPHOS 85 90  BILITOT 0.8 0.8  ALBUMIN 3.1* 2.5*    Cardiac Enzymes Recent Labs  Lab 12/23/17 2205 12/24/17 0342 12/24/17 0853  TROPONINI 1.55* 1.55* 1.18*    Glucose Recent Labs  Lab 12/26/17 1205 12/26/17 1555 12/26/17 2019 12/26/17 2348 12/27/17 0353 12/27/17 0744  GLUCAP 123* 134* 122* 118* 119* 122*    Imaging Dg Chest Port 1 View  Result Date: 12/27/2017 CLINICAL DATA:  Short of breath, respiratory failure EXAM: PORTABLE CHEST 1 VIEW COMPARISON:  Portable chest x-ray of 12/25/2016 FINDINGS: There has been some improvement in opacity throughout the right lung although there may still  be either mild pulmonary vascular congestion which is somewhat asymmetric or pneumonia. There may well be small pleural effusions present. Cardiomegaly is stable and pacer wires remain. Reverse right shoulder replacement is noted. IMPRESSION: 1. Some improvement in opacity throughout the right lung. This may be due to asymmetrical edema or possibly pneumonia with probable small pleural effusions. 2. Stable cardiomegaly with permanent pacemaker. Electronically Signed   By: Ivar Drape M.D.   On: 12/27/2017 08:56     STUDIES:  2018 Echo> LVEF 20-25%, mild to moderate AS, mild MR, LAE, RAE  CULTURES: 7/5 urine > GNR 7/5 Blood > e coli  ANTIBIOTICS: 7/5 zosy > x1 7/5 vanc x1 7/5 ceftriaxone > 7/7 7/7 zosyn >   SIGNIFICANT EVENTS: 7/5 late PM VT arrest, CPR, no ventilation 7/6 late PM zosyn  LINES/TUBES:   DISCUSSION: 84 y/o male with urinary sepsis causing e coli bacteremia received crystalloid resuscitation  and developed acute pulmonary edema and a VT arrest late in the evening of 7/5.  Had torsades type rhythm abnormality.  He had some aspiraiton   ASSESSMENT / PLAN:  PULMONARY A: Acute respiratory failure with hypoxemia Acute pulmonary edema  Aspiration pneumonitis P:   Wean O2 as tolerated  Continue high flow O2 to maintain O2 saturation > 90%  CARDIOVASCULAR A:  Acute on chronic systolic heart failure Afib with RVR  VT arrest Aortic stenosis P:  Tele Resume lasix 40mg  daily  Appreciate cards follow up  Echo 25-30%    RENAL A:   Acute on chronic renal failure, baseline Cr 1.1-1.4 P:   Monitor BMET and UOP Replace electrolytes as needed   GASTROINTESTINAL A:   Dysphagia P:   Diet as tolerated  Patient has loose bowel motion which is chronic. I can not restart imodium due to risk for torsades   HEMATOLOGIC A:   Anemia without bleeding P:  Monitor for bleeding Transfuse PRBC for Hgb < 7 gm/dL   INFECTIOUS A:   E coli bacteremia from UTI Aspiration pneumonitis P:   Continue zosyn for asp PNA  ID follow up is appreciated  WBC trending down   ENDOCRINE A:   Hyperglycemia P:   SSI  NEUROLOGIC A:   No acute issues P:   Minimize sedating medicine  FAMILY  - Updates: 7/9 wife updated bedside   - Inter-disciplinary family meet or Palliative Care meeting due by:  Day 7  This patient is critically ill and needs to be monitored in the ICU given his significant risk of death from respiratory failure and cardiac failure form acute on chronic systolic heart failure and septic shock.   Silver Huguenin , MD [place] [facility] [phone]   12/27/2017, 9:27 AM

## 2017-12-27 NOTE — Progress Notes (Signed)
Patient ID: Christopher Baker, male   DOB: 10/16/1933, 84 y.o.   MRN: [id number]         [facility]  Date of Admission:  12/23/2017   Total days of antibiotics 5         ASSESSMENT: He is improving ceftriaxone therapy for recurrent E. coli bacteremia and UTI.  He supposedly had a question of aspiration after eating some crackers this past weekend.  I do not think that he has pneumonia clinically.  The interstitial infiltrates in his right lung are improving with diuresis.  Although his recent acute low back pain is probably due to musculoskeletal injury sustained in a fall I will check a renal ultrasound just to make sure that he does not have new acute changes in the right kidney could be associated with recurrent UTI and bacteremia.  PLAN: 1. Continue ceftriaxone 2. Renal ultrasound  Principal Problem:   E coli bacteremia Active Problems:   Urinary tract infection   Sepsis (HCC)   Ventricular tachycardia (HCC)   Pressure injury of skin   Acute pulmonary edema (HCC)   Drug-induced torsades de pointes   Atrial fibrillation with rapid ventricular response (HCC)   Acute respiratory failure with hypoxemia (HCC)   Aspiration pneumonia of right upper lobe due to gastric secretions (HCC)   Scheduled Meds: . apixaban  2.5 mg Oral BID  . atorvastatin  20 mg Oral Daily  . donepezil  10 mg Oral QHS  . DULoxetine  30 mg Oral QHS  . ferrous sulfate  325 mg Oral BID WC  . folic acid  1 mg Oral QHS  . furosemide  40 mg Intravenous Daily  . lidocaine  1 patch Transdermal Q24H  . mouth rinse  15 mL Mouth Rinse BID  . metoprolol succinate  25 mg Oral QHS  . pantoprazole  40 mg Oral QHS   Continuous Infusions: . sodium chloride    . cefTRIAXone (ROCEPHIN)  IV Stopped (12/26/17 1853)   PRN Meds:.sodium chloride, acetaminophen **OR** acetaminophen, oxyCODONE-acetaminophen   SUBJECTIVE: He is feeling better today.  He has some shortness of breath but denies any  cough.  His wife confirms that he is not coughing.  He has been bothered by severe right lower back pain since he fell in the bathroom and struck his lower back on the toilet several weeks ago.  He denies any current dysuria.  Review of Systems: Review of Systems  Constitutional: Negative for chills, diaphoresis and fever.  Respiratory: Positive for shortness of breath. Negative for cough and sputum production.   Cardiovascular: Negative for chest pain.  Gastrointestinal: Negative for abdominal pain, diarrhea, nausea and vomiting.  Genitourinary: Positive for frequency. Negative for dysuria and urgency.  Musculoskeletal: Positive for back pain.  Skin: Negative for rash.    Allergies  Allergen Reactions  . Ciprofloxacin Nausea Only and Other (See Comments)    Stomach ache  . Hydrocodone-Acetaminophen Other (See Comments)    Hallucinations in higher doses  . Other Other (See Comments)    Rash from neoprene wrap after knee surgery  . Shellfish Allergy Nausea And Vomiting and Other (See Comments)    Reaction to scallops  . Tramadol Other (See Comments)    Pt becomes combative per wife    OBJECTIVE: Vitals:   12/27/17 0800 12/27/17 0900 12/27/17 1000 12/27/17 1010  BP: (!) 139/55 137/60 (!) 142/65   Pulse: 74 (!) 43 (!) 56   Resp: (!) 21 17 20  Temp:      TempSrc:      SpO2: 99% 98% 99% 100%  Weight:      Height:       Body mass index is 26.21 kg/m.  Physical Exam  Constitutional: He is oriented to person, place, and time.  He is more alert today.  He is resting quietly in bed.  His wife is at the bedside.  Cardiovascular: Normal rate, regular rhythm and normal heart sounds.  No murmur heard. Pulmonary/Chest: Effort normal. He has no wheezes. He has no rales.  Abdominal: Soft. He exhibits no distension. There is no tenderness.  Neurological: He is alert and oriented to person, place, and time.  Skin: No rash noted.    Lab Results Lab Results  Component Value Date    WBC 11.9 (H) 12/27/2017   HGB 10.3 (L) 12/27/2017   HCT 32.8 (L) 12/27/2017   MCV 87.0 12/27/2017   PLT 131 (L) 12/27/2017    Lab Results  Component Value Date   CREATININE 1.57 (H) 12/27/2017   BUN 39 (H) 12/27/2017   NA 142 12/27/2017   K 4.0 12/27/2017   CL 109 12/27/2017   CO2 17 (L) 12/27/2017    Lab Results  Component Value Date   ALT 125 (H) 12/24/2017   AST 145 (H) 12/24/2017   ALKPHOS 90 12/24/2017   BILITOT 0.8 12/24/2017     Microbiology: Recent Results (from the past 240 hour(s))  Blood Culture (routine x 2)     Status: Abnormal   Collection Time: 12/23/17  8:13 AM  Result Value Ref Range Status   Specimen Description BLOOD RIGHT ANTECUBITAL  Final   Special Requests   Final    BOTTLES DRAWN AEROBIC AND ANAEROBIC Blood Culture adequate volume   Culture  Setup Time   Final    GRAM NEGATIVE RODS IN BOTH AEROBIC AND ANAEROBIC BOTTLES CRITICAL RESULT CALLED TO, READ BACK BY AND VERIFIED WITHFerne Coe [facility] 2126 12/23/17 A BROWNING Performed at [facility], [place] [address], [place], [place] [address]    Culture ESCHERICHIA COLI (A)  Final   Report Status 12/25/2017 FINAL  Final   Organism ID, Bacteria ESCHERICHIA COLI  Final      Susceptibility   Escherichia coli - MIC*    AMPICILLIN >=32 RESISTANT Resistant     CEFAZOLIN <=4 SENSITIVE Sensitive     CEFEPIME <=1 SENSITIVE Sensitive     CEFTAZIDIME <=1 SENSITIVE Sensitive     CEFTRIAXONE <=1 SENSITIVE Sensitive     CIPROFLOXACIN <=0.25 SENSITIVE Sensitive     GENTAMICIN <=1 SENSITIVE Sensitive     IMIPENEM <=0.25 SENSITIVE Sensitive     TRIMETH/SULFA <=20 SENSITIVE Sensitive     AMPICILLIN/SULBACTAM >=32 RESISTANT Resistant     PIP/TAZO <=4 SENSITIVE Sensitive     Extended ESBL NEGATIVE Sensitive     * ESCHERICHIA COLI  Blood Culture ID Panel (Reflexed)     Status: Abnormal   Collection Time: 12/23/17  8:13 AM  Result Value Ref Range Status   Enterococcus species NOT DETECTED NOT DETECTED Final    Listeria monocytogenes NOT DETECTED NOT DETECTED Final   Staphylococcus species NOT DETECTED NOT DETECTED Final   Staphylococcus aureus NOT DETECTED NOT DETECTED Final   Streptococcus species NOT DETECTED NOT DETECTED Final   Streptococcus agalactiae NOT DETECTED NOT DETECTED Final   Streptococcus pneumoniae NOT DETECTED NOT DETECTED Final   Streptococcus pyogenes NOT DETECTED NOT DETECTED Final   Acinetobacter baumannii NOT DETECTED NOT  DETECTED Final   Enterobacteriaceae species DETECTED (A) NOT DETECTED Final    Comment: Enterobacteriaceae represent a large family of gram-negative bacteria, not a single organism. CRITICAL RESULT CALLED TO, READ BACK BY AND VERIFIED WITH: E MARTIN PHARMD 2126 12/23/17 A BROWNING    Enterobacter cloacae complex NOT DETECTED NOT DETECTED Final   Escherichia coli DETECTED (A) NOT DETECTED Final    Comment: CRITICAL RESULT CALLED TO, READ BACK BY AND VERIFIED WITH: E MARTIN PHARMD 2126 12/23/17 A BROWNING    Klebsiella oxytoca NOT DETECTED NOT DETECTED Final   Klebsiella pneumoniae NOT DETECTED NOT DETECTED Final   Proteus species NOT DETECTED NOT DETECTED Final   Serratia marcescens NOT DETECTED NOT DETECTED Final   Carbapenem resistance NOT DETECTED NOT DETECTED Final   Haemophilus influenzae NOT DETECTED NOT DETECTED Final   Neisseria meningitidis NOT DETECTED NOT DETECTED Final   Pseudomonas aeruginosa NOT DETECTED NOT DETECTED Final   Candida albicans NOT DETECTED NOT DETECTED Final   Candida glabrata NOT DETECTED NOT DETECTED Final   Candida krusei NOT DETECTED NOT DETECTED Final   Candida parapsilosis NOT DETECTED NOT DETECTED Final   Candida tropicalis NOT DETECTED NOT DETECTED Final    Comment: Performed at [facility], [place] [address], [place], [place] [address]  Blood Culture (routine x 2)     Status: Abnormal   Collection Time: 12/23/17  8:30 AM  Result Value Ref Range Status   Specimen Description BLOOD LEFT HAND  Final   Special  Requests   Final    BOTTLES DRAWN AEROBIC AND ANAEROBIC Blood Culture results may not be optimal due to an inadequate volume of blood received in culture bottles   Culture  Setup Time   Final    GRAM NEGATIVE RODS IN BOTH AEROBIC AND ANAEROBIC BOTTLES CRITICAL VALUE NOTED.  VALUE IS CONSISTENT WITH PREVIOUSLY REPORTED AND CALLED VALUE.    Culture (A)  Final    ESCHERICHIA COLI SUSCEPTIBILITIES PERFORMED ON PREVIOUS CULTURE WITHIN THE LAST 5 DAYS. Performed at [facility], [place] [address] [place], [place] [address]    Report Status 12/25/2017 FINAL  Final  Urine culture     Status: Abnormal   Collection Time: 12/23/17  8:44 AM  Result Value Ref Range Status   Specimen Description URINE, CATHETERIZED  Final   Special Requests   Final    NONE Performed at [facility], [place] [address] [place],  [address]    Culture >=100,000 COLONIES/mL ESCHERICHIA COLI (A)  Final   Report Status 12/25/2017 FINAL  Final   Organism ID, Bacteria ESCHERICHIA COLI (A)  Final      Susceptibility   Escherichia coli - MIC*    AMPICILLIN >=32 RESISTANT Resistant     CEFAZOLIN 16 SENSITIVE Sensitive     CEFTRIAXONE <=1 SENSITIVE Sensitive     CIPROFLOXACIN <=0.25 SENSITIVE Sensitive     GENTAMICIN <=1 SENSITIVE Sensitive     IMIPENEM <=0.25 SENSITIVE Sensitive     NITROFURANTOIN <=16 SENSITIVE Sensitive     TRIMETH/SULFA <=20 SENSITIVE Sensitive     AMPICILLIN/SULBACTAM >=32 RESISTANT Resistant     PIP/TAZO <=4 SENSITIVE Sensitive     Extended ESBL NEGATIVE Sensitive     * >=100,000 COLONIES/mL ESCHERICHIA COLI  MRSA PCR Screening     Status: None   Collection Time: 12/23/17  9:06 PM  Result Value Ref Range Status   MRSA by PCR NEGATIVE NEGATIVE Final    Comment:  The GeneXpert MRSA Assay (FDA approved for NASAL specimens only), is one component of a comprehensive MRSA colonization surveillance program. It is not intended to diagnose MRSA infection nor to guide  or monitor treatment for MRSA infections. Performed at [facility], [place] [address], [place], [place] [address]   Culture, blood (Routine X 2) w Reflex to ID Panel     Status: None (Preliminary result)   Collection Time: 12/24/17 12:15 PM  Result Value Ref Range Status   Specimen Description BLOOD BLOOD LEFT HAND  Final   Special Requests   Final    BOTTLES DRAWN AEROBIC ONLY Blood Culture results may not be optimal due to an inadequate volume of blood received in culture bottles   Culture   Final    NO GROWTH 2 DAYS Performed at [facility], [place] [address] [place], [place] [address]    Report Status PENDING  Incomplete  Culture, blood (Routine X 2) w Reflex to ID Panel     Status: None (Preliminary result)   Collection Time: 12/24/17 12:15 PM  Result Value Ref Range Status   Specimen Description BLOOD BLOOD RIGHT HAND  Final   Special Requests   Final    BOTTLES DRAWN AEROBIC AND ANAEROBIC Blood Culture adequate volume   Culture   Final    NO GROWTH 2 DAYS Performed at [facility], [place] [address], [place],  [address]    Report Status PENDING  Incomplete    Michel Bickers, MD [facility] [place] [facility] [phone] pager   [phone] cell 12/27/2017, 10:28 AM

## 2017-12-27 NOTE — Progress Notes (Signed)
Wound note sacral area Right sacrum 2cm X .5cm reddened area Center mid sacrum 2cm X 2cm reddened area Moisture barrier and Dr. Everrett Coombe butt balm applied. Wife does state that he has had some breakdown to his sacrum prior to admission.

## 2017-12-27 NOTE — Evaluation (Signed)
Physical Therapy Evaluation Patient Details Name: Christopher Baker MRN: [id number] DOB: 1933-12-11 Today's Date: 12/27/2017   History of Present Illness  Christopher Baker is a 84 y.o. male with medical history significant of paroxysmal A. fib currently has a pacemaker, prior CVA, chronic systolic CHF with an EF of 25%, mild dementia, hypertension, hyperlipidemia, who is being brought to the hospital by his wife after she found him early this morning being confused and poorly responsive.   Clinical Impression  Pt admitted with above. Pt was indep with rollator and living with wife PTA. Pt has been in bed for about a week but was able to stand and complete 2 std pvt transfers today. Pt motivated to get stronger and return. R hip and flank pain was the limiting factor today for ambulation. Pt with decreased R LE WBing tolerance due to pain. Pt to benefit from CIR upon d/c to maximize functional return for safe return home with spouse. Acute PT to cont. To follow.    Follow Up Recommendations CIR    Equipment Recommendations       Recommendations for Other Services OT consult     Precautions / Restrictions Precautions Precautions: Fall Precaution Comments: chest and ribs sore from CPR Restrictions Weight Bearing Restrictions: No Other Position/Activity Restrictions: self R UE limiting due to R flank pain and solid bump/mass at bottum of rib cage      Mobility  Bed Mobility Overal bed mobility: Needs Assistance Bed Mobility: Supine to Sit     Supine to sit: Max assist;HOB elevated     General bed mobility comments: pt with R flank pain, initiated LE movement to EOB, due to chest/flank pain, maxA for trunk elevation required  Transfers Overall transfer level: Needs assistance Equipment used: 2 person hand held assist Transfers: Stand Pivot Transfers;Squat Pivot Transfers   Stand pivot transfers: Mod assist;+2 physical assistance Squat pivot transfers: Mod assist;+2 physical  assistance     General transfer comment: had pt hold onto pillow to help with pain management, rocked/used momentum. Pt able to power up, modA to achieve full upright posture, max directional verbal cues for stepping during std pvt. pt unable to step towards the R due to R hip pain, BSC and chair set up to patients left. Pt completed 5 stands and 2 std pvt transfers, attempted RW however pt unable to achieve full upright posture due to flank pain  Ambulation/Gait             General Gait Details: unable this date  Stairs            Wheelchair Mobility    Modified Rankin (Stroke Patients Only)       Balance Overall balance assessment: Needs assistance Sitting-balance support: Feet unsupported;Bilateral upper extremity supported Sitting balance-Leahy Scale: Fair     Standing balance support: Bilateral upper extremity supported Standing balance-Leahy Scale: Poor Standing balance comment: dependent on physical assist                             Pertinent Vitals/Pain Pain Assessment: 0-10 Pain Score: 8  Pain Location: R flank, chest, and R hip Pain Intervention(s): Monitored during session(RN present, MD also came to look at R rib cage mass)    Home Living Family/patient expects to be discharged to:: Private residence Living Arrangements: Spouse/significant other Available Help at Discharge: Family;Available 24 hours/day Type of Home: House Home Access: Stairs to enter Entrance Stairs-Rails: Psychiatric nurse  of Steps: 3 Home Layout: Able to live on main level with bedroom/bathroom Home Equipment: Walker - 4 wheels      Prior Function Level of Independence: Independent with assistive device(s)         Comments: amb with rollator      Hand Dominance   Dominant Hand: Right    Extremity/Trunk Assessment   Upper Extremity Assessment Upper Extremity Assessment: Generalized weakness    Lower Extremity Assessment Lower  Extremity Assessment: Generalized weakness    Cervical / Trunk Assessment Cervical / Trunk Assessment: Kyphotic  Communication   Communication: No difficulties  Cognition Arousal/Alertness: Awake/alert Behavior During Therapy: WFL for tasks assessed/performed Overall Cognitive Status: Within Functional Limits for tasks assessed                                        General Comments General comments (skin integrity, edema, etc.): pt assisted to [facility], dependent for hygiene    Exercises General Exercises - Lower Extremity Ankle Circles/Pumps: AROM;Both;10 reps;Seated Long Arc Quad: AROM;Both;10 reps;Seated Hip Flexion/Marching: AROM;Both;10 reps;Seated   Assessment/Plan    PT Assessment Patient needs continued PT services  PT Problem List Decreased strength;Decreased range of motion;Decreased activity tolerance;Decreased balance;Decreased mobility;Decreased coordination;Decreased cognition;Decreased knowledge of use of DME;Decreased safety awareness       PT Treatment Interventions Gait training;DME instruction;Stair training;Functional mobility training;Therapeutic activities;Therapeutic exercise;Balance training;Neuromuscular re-education    PT Goals (Current goals can be found in the Care Plan section)  Acute Rehab PT Goals Patient Stated Goal: get stronger and manage pain PT Goal Formulation: With patient Time For Goal Achievement: 01/10/18 Potential to Achieve Goals: Good    Frequency Min 3X/week   Barriers to discharge        Co-evaluation               AM-PAC PT "6 Clicks" Daily Activity  Outcome Measure Difficulty turning over in bed (including adjusting bedclothes, sheets and blankets)?: Unable Difficulty moving from lying on back to sitting on the side of the bed? : Unable Difficulty sitting down on and standing up from a chair with arms (e.g., wheelchair, bedside commode, etc,.)?: Unable Help needed moving to and from a bed to chair  (including a wheelchair)?: Total Help needed walking in hospital room?: Total Help needed climbing 3-5 steps with a railing? : Total 6 Click Score: 6    End of Session Equipment Utilized During Treatment: Oxygen(12LO2 via [place]) Activity Tolerance: Patient limited by pain Patient left: in chair;with call bell/phone within reach;with nursing/sitter in room;with family/visitor present Nurse Communication: Mobility status PT Visit Diagnosis: Unsteadiness on feet (R26.81);Pain;History of falling (Z91.81) Pain - Right/Left: Right Pain - part of body: Hip    Time: 0786-7544 PT Time Calculation (min) (ACUTE ONLY): 50 min   Charges:   PT Evaluation $PT Eval Moderate Complexity: 1 Mod PT Treatments $Therapeutic Activity: 23-37 mins   PT G Codes:        Kittie Plater, PT, DPT Pager #: [phone] Office #: [phone]   Shervin Cypert M Jamaria Amborn 12/27/2017, 3:32 PM

## 2017-12-27 NOTE — Progress Notes (Signed)
Progress Note  Patient Name: Christopher Baker Date of Encounter: 12/27/2017  Primary Cardiologist:Geralyn Figiel/Turner  Subjective   "I feel better"  Inpatient Medications    Scheduled Meds: . apixaban  2.5 mg Oral BID  . atorvastatin  20 mg Oral Daily  . donepezil  10 mg Oral QHS  . DULoxetine  30 mg Oral QHS  . ferrous sulfate  325 mg Oral BID WC  . folic acid  1 mg Oral QHS  . lidocaine  1 patch Transdermal Q24H  . mouth rinse  15 mL Mouth Rinse BID  . metoprolol succinate  25 mg Oral QHS  . pantoprazole  40 mg Oral QHS   Continuous Infusions: . sodium chloride    . cefTRIAXone (ROCEPHIN)  IV 2 g (12/26/17 1823)   PRN Meds: sodium chloride, acetaminophen **OR** acetaminophen, oxyCODONE-acetaminophen   Vital Signs    Vitals:   12/27/17 0500 12/27/17 0600 12/27/17 0700 12/27/17 0742  BP: 138/70 116/60 136/80   Pulse: (!) 55 80 81   Resp: 20 (!) 24 (!) 31   Temp:    (!) 97.2 F (36.2 C)  TempSrc:    Oral  SpO2: 90% 98% 96%   Weight: 209 lb 10.5 oz (95.1 kg)     Height:        Intake/Output Summary (Last 24 hours) at 12/27/2017 0757 Last data filed at 12/27/2017 0500 Gross per 24 hour  Intake 256.12 ml  Output 1906 ml  Net -1649.88 ml   Filed Weights   12/25/17 0500 12/26/17 0600 12/27/17 0500  Weight: 212 lb 4.9 oz (96.3 kg) 208 lb 8.9 oz (94.6 kg) 209 lb 10.5 oz (95.1 kg)    Telemetry    Atrial fibrillation with a controlled VR - Personally Reviewed  ECG    none - Personally Reviewed  Physical Exam   GEN: much improved, no distress today Neck: No JVD Cardiac: IRIRR, no murmurs, rubs, or gallops.  Respiratory: Clear to auscultation bilaterally. GI: Soft, nontender, non-distended  MS: No edema; No deformity. Right flank pain. Neuro:  Nonfocal  Psych: Normal affect   Labs    Chemistry Recent Labs  Lab 12/23/17 0813  12/24/17 0342  12/25/17 0334 12/26/17 0314 12/27/17 0303  NA 142   < > 143   < > 144 143 142  K 3.7   < > 4.4   < > 3.6 3.6  4.0  CL 109   < > 112*   < > 112* 109 109  CO2 22   < > 19*   < > 24 25 17*  GLUCOSE 127*   < > 163*   < > 137* 135* 124*  BUN 26*   < > 31*   < > 36* 35* 39*  CREATININE 1.37*   < > 1.61*   < > 1.52* 1.49* 1.57*  CALCIUM 9.4   < > 8.9   < > 9.0 9.0 9.0  PROT 6.0*  --  5.4*  --   --   --   --   ALBUMIN 3.1*  --  2.5*  --   --   --   --   AST 31  --  145*  --   --   --   --   ALT 28  --  125*  --   --   --   --   ALKPHOS 85  --  90  --   --   --   --  BILITOT 0.8  --  0.8  --   --   --   --   GFRNONAA 46*   < > 38*   < > 41* 42* 39*  GFRAA 54*   < > 44*   < > 47* 49* 46*  ANIONGAP 11   < > 12   < > 8 9 16*   < > = values in this interval not displayed.     Hematology Recent Labs  Lab 12/25/17 0334 12/26/17 0314 12/27/17 0303  WBC 18.3* 15.1* 11.9*  RBC 3.58* 3.52* 3.77*  HGB 9.9* 9.8* 10.3*  HCT 33.1* 31.8* 32.8*  MCV 92.5 90.3 87.0  MCH 27.7 27.8 27.3  MCHC 29.9* 30.8 31.4  RDW 16.4* 16.2* 16.2*  PLT 163 139* 131*    Cardiac Enzymes Recent Labs  Lab 12/23/17 2205 12/24/17 0342 12/24/17 0853  TROPONINI 1.55* 1.55* 1.18*   No results for input(s): TROPIPOC in the last 168 hours.   BNPNo results for input(s): BNP, PROBNP in the last 168 hours.   DDimer No results for input(s): DDIMER in the last 168 hours.   Radiology    No results found.  Cardiac Studies   none  Patient Profile     84 y.o. male admitted with urosepsis, torsades, atrial fib now better  Assessment & Plan    1. Atrial fib with a RVR - as he has improved, his VR is much better today.  2. Urosepsis - continue IV anti-biotics. 3. Flank pain - do we know he does not have an abscess? 4. Complete heart block - he was in atrial fib with RVR which is now improved. 5. Elevated troponin - this is a supply/demand issue. No additional workup at this time.   For questions or updates, please contact [place] Please consult [email or web address] for contact info under Cardiology/STEMI.       Signed, Cristopher Peru, MD  12/27/2017, 7:57 AM  Patient ID: [name], male   DOB: 10/13/1933, 84 y.o.   MRN: [id number]

## 2017-12-28 ENCOUNTER — Inpatient Hospital Stay (HOSPITAL_COMMUNITY): Payer: PPO

## 2017-12-28 DIAGNOSIS — R5383 Other fatigue: Secondary | ICD-10-CM

## 2017-12-28 DIAGNOSIS — R0602 Shortness of breath: Secondary | ICD-10-CM

## 2017-12-28 DIAGNOSIS — I4891 Unspecified atrial fibrillation: Secondary | ICD-10-CM

## 2017-12-28 DIAGNOSIS — R6883 Chills (without fever): Secondary | ICD-10-CM

## 2017-12-28 DIAGNOSIS — R5381 Other malaise: Secondary | ICD-10-CM

## 2017-12-28 LAB — BASIC METABOLIC PANEL
ANION GAP: 9 (ref 5–15)
BUN: 38 mg/dL — ABNORMAL HIGH (ref 8–23)
CO2: 25 mmol/L (ref 22–32)
Calcium: 9.1 mg/dL (ref 8.9–10.3)
Chloride: 107 mmol/L (ref 98–111)
Creatinine, Ser: 1.49 mg/dL — ABNORMAL HIGH (ref 0.61–1.24)
GFR, EST AFRICAN AMERICAN: 49 mL/min — AB (ref >60)
GFR, EST NON AFRICAN AMERICAN: 42 mL/min — AB (ref >60)
Glucose, Bld: 128 mg/dL — ABNORMAL HIGH (ref 70–99)
POTASSIUM: 3.6 mmol/L (ref 3.5–5.1)
SODIUM: 141 mmol/L (ref 135–145)

## 2017-12-28 LAB — CBC WITH DIFFERENTIAL/PLATELET
ABS IMMATURE GRANULOCYTES: 0.2 10*3/uL — AB (ref 0.0–0.1)
BASOS ABS: 0 10*3/uL (ref 0.0–0.1)
Basophils Relative: 0 %
Eosinophils Absolute: 0 10*3/uL (ref 0.0–0.7)
Eosinophils Relative: 0 %
HCT: 32.8 % — ABNORMAL LOW (ref 39.0–52.0)
Hemoglobin: 10.1 g/dL — ABNORMAL LOW (ref 13.0–17.0)
IMMATURE GRANULOCYTES: 1 %
Lymphocytes Relative: 4 %
Lymphs Abs: 0.6 10*3/uL — ABNORMAL LOW (ref 0.7–4.0)
MCH: 27.5 pg (ref 26.0–34.0)
MCHC: 30.8 g/dL (ref 30.0–36.0)
MCV: 89.4 fL (ref 78.0–100.0)
MONOS PCT: 10 %
Monocytes Absolute: 1.4 10*3/uL — ABNORMAL HIGH (ref 0.1–1.0)
NEUTROS ABS: 12.2 10*3/uL — AB (ref 1.7–7.7)
NEUTROS PCT: 85 %
PLATELETS: 163 10*3/uL (ref 150–400)
RBC: 3.67 MIL/uL — ABNORMAL LOW (ref 4.22–5.81)
RDW: 16.2 % — ABNORMAL HIGH (ref 11.5–15.5)
WBC: 14.4 10*3/uL — ABNORMAL HIGH (ref 4.0–10.5)

## 2017-12-28 LAB — GLUCOSE, CAPILLARY
GLUCOSE-CAPILLARY: 116 mg/dL — AB (ref 70–99)
GLUCOSE-CAPILLARY: 117 mg/dL — AB (ref 70–99)
Glucose-Capillary: 121 mg/dL — ABNORMAL HIGH (ref 70–99)
Glucose-Capillary: 116 mg/dL — ABNORMAL HIGH (ref 70–99)
Glucose-Capillary: 106 mg/dL — ABNORMAL HIGH (ref 70–99)

## 2017-12-28 MED ORDER — ACETAMINOPHEN 500 MG PO TABS
500.0000 mg | ORAL_TABLET | Freq: Four times a day (QID) | ORAL | Status: DC
  Administered 2017-12-28: 500 mg via ORAL
  Filled 2017-12-28: qty 1

## 2017-12-28 MED ORDER — ACETAMINOPHEN 650 MG RE SUPP: 650.0000 mg | Freq: Four times a day (QID) | RECTAL | Status: DC

## 2017-12-28 MED ORDER — KETOROLAC TROMETHAMINE 15 MG/ML IJ SOLN
15.0000 mg | Freq: Once | INTRAMUSCULAR | Status: AC
  Administered 2017-12-28: 15 mg via INTRAVENOUS
  Filled 2017-12-28: qty 1

## 2017-12-28 MED ORDER — PRO-STAT SUGAR FREE PO LIQD
30.0000 mL | Freq: Three times a day (TID) | ORAL | Status: DC
  Administered 2017-12-28 – 2017-12-30 (×7): 30 mL via ORAL
  Filled 2017-12-28 (×6): qty 30

## 2017-12-28 MED ORDER — ACETAMINOPHEN 500 MG PO TABS: 500.0000 mg | ORAL_TABLET | Freq: Four times a day (QID) | ORAL | Status: DC

## 2017-12-28 MED ORDER — ACETAMINOPHEN 325 MG PO TABS
650.0000 mg | ORAL_TABLET | Freq: Four times a day (QID) | ORAL | Status: DC
  Administered 2017-12-28 – 2017-12-30 (×8): 650 mg via ORAL
  Filled 2017-12-28 (×9): qty 2

## 2017-12-28 NOTE — Progress Notes (Signed)
Initial Nutrition Assessment  DOCUMENTATION CODES:   Not applicable  INTERVENTION:   30 ml Prostat TID, each supplement provides 100 kcals and 15 grams protein.   NUTRITION DIAGNOSIS:   Inadequate oral intake related to poor appetite as evidenced by meal completion < 50%.  GOAL:   Patient will meet greater than or equal to 90% of their needs  MONITOR:   PO intake, Weight trends, Supplement acceptance, Labs, I & O's  REASON FOR ASSESSMENT:   Consult Assessment of nutrition requirement/status  ASSESSMENT:   Patient with PMH significant for HTN, HLD, PAF, A fib. s/p pacemaker, CVA, CHF, and mild dementia. Presents this admission with complaints of confusion and less responsiveness. Admitted for sepsis due to UTI. On 7/5 pt cardiac arrested, required CPR resuscitation.    Pt lethargic upon visit. Spoke with wife who is concerned about pt's protein status. Wife denies pt had loss in appetite PTA. Meals consist of:   B-yogurt, oats, fruit, sunflower seeds L- lunch meat sandwich, salad, chickpeas, cottage cheese, and fruit D- salad with chicken skewers, frozen pizza  Supplements- 0.5-1 premier protein  Diet looks to have protein rich sources throughout the day, discussed this with the wife. Wife had questions regarding sodium intake. RD provided "Heart Failure Nutrition Therapy" handout from the [employer].  Provided examples on ways to decrease sodium intake in diet. Discouraged intake of processed foods and use of salt shaker.   Intake is off and on at this time. Appetite is not yet back to baseline. Pt tolerated 25% of his fruit this morning. Pt having loose stools. Will trial prostat to maximize protein this visit.   Wife endorses pt's UBW stays around 215 lb and is unsure about recent wt loss. Records indicate pt's weight fluctuates from 215-230 lb. Suspect this is related to CHF history. Will continue to monitor trends. Nutrition-Focused physical exam  completed. Fluid accumulation may be masking losses.  Medications reviewed and include: ferrous sulfate, folic acid, 40 mg lasix once/day Labs reviewed.   NUTRITION - FOCUSED PHYSICAL EXAM:    Most Recent Value  Orbital Region  Mild depletion  Upper Arm Region  No depletion  Thoracic and Lumbar Region  Unable to assess  Buccal Region  No depletion  Temple Region  Mild depletion  Clavicle Bone Region  No depletion  Clavicle and Acromion Bone Region  Mild depletion  Scapular Bone Region  Unable to assess  Dorsal Hand  No depletion  Patellar Region  Mild depletion  Anterior Thigh Region  Mild depletion  Posterior Calf Region  Unable to assess [bilateral lower extremity swelling]  Edema (RD Assessment)  Moderate  Hair  Reviewed  Eyes  Reviewed  Mouth  Reviewed  Skin  Reviewed  Nails  Reviewed     Diet Order:   Diet Order           Diet Heart Room service appropriate? Yes; Fluid consistency: Thin  Diet effective now          EDUCATION NEEDS:   Education needs have been addressed  Skin:  Skin Assessment: Skin Integrity Issues: Skin Integrity Issues:: Stage I Stage I: sacrum/buttocks  Last BM:  12/28/17  Height:   Ht Readings from Last 1 Encounters:  12/23/17 6\' 3"  (1.905 m)    Weight:   Wt Readings from Last 1 Encounters:  12/28/17 208 lb 8.9 oz (94.6 kg)    Ideal Body Weight:  89.1 kg  BMI:  Body mass index is 26.07  kg/m.  Estimated Nutritional Needs:   Kcal:  2300-2500 kcal  Protein:  115-125 grams  Fluid:  >2.3 L/day    Mariana Single RD, LDN Clinical Nutrition Pager # - [phone]

## 2017-12-28 NOTE — Progress Notes (Signed)
Called by Dr. Elwyn Reach for this patient, who will be transferred back to [facility] service tomorrow.  He is an 84yo male admitted with E coli bacteremia with sepsis.  He aspirated on the floor, went on high-flow O2.  Diuresed, treated with antibiotics.  Also had a V tach arrest.  He is improving overall and reasonable to go to SDU.  No CPR but he is okay with intubation if needed.  He will need to finish his antibiotics, continue PT/OT, may need placement for rehab.  Cardiology/EP is seeing him and does not think he needs AICD but it may be reasonable to check with them about this prior to discharge.  [facility] will assume care of the patient tomorrow AM and appreciates the opportunity to resume care of this patient.   Carlyon Shadow, M.D.

## 2017-12-28 NOTE — Progress Notes (Signed)
Occupational Therapy Treatment Patient Details Name: Christopher Baker MRN: [id number] DOB: 16-Feb-1934 Today's Date: 12/28/2017    History of present illness Christopher Baker is a 84 y.o. male with medical history significant of paroxysmal A. fib currently has a pacemaker, prior CVA, chronic systolic CHF with an EF of 25%, mild dementia, hypertension, hyperlipidemia, who is being brought to the hospital by his wife after she found him early this morning being confused and poorly responsive.    OT comments  PTA, pt was living with his wife and was performing ADLs and using rollator for functional mobility. Pt currently requiring Mod A for UB ADLs, Max A for LB ADLs, and Mod A +2 for functional transfers. Pt presenting with decreased strength, balance, activity tolerance, and cognition. Pt with good family support. Pt would benefit from further acute OT to facilitate safe dc. Recommend dc to CIR for further OT to optimize safety, independence with ADLs, and return to PLOF.      Follow Up Recommendations  CIR;Supervision/Assistance - 24 hour    Equipment Recommendations  Other (comment)(Defer to next venue)    Recommendations for Other Services Rehab consult;PT consult;Speech consult    Precautions / Restrictions Precautions Precautions: Fall Precaution Comments: chest and ribs sore from CPR Restrictions Weight Bearing Restrictions: No Other Position/Activity Restrictions: self R UE limiting due to R flank pain and solid bump/mass at bottum of rib cage       Mobility Bed Mobility Overal bed mobility: Needs Assistance Bed Mobility: Supine to Sit     Supine to sit: Max assist;HOB elevated     General bed mobility comments: pt with R flank pain, initiated LE movement to EOB, due to chest/flank pain, maxA for trunk elevation required  Transfers Overall transfer level: Needs assistance Equipment used: 2 person hand held assist Transfers: Sit to/from [facility] Sit to  Stand: Mod assist;+2 physical assistance Stand pivot transfers: Mod assist;+2 physical assistance Squat pivot transfers: Mod assist;+2 physical assistance     General transfer comment: pt able to push up to erect posture, modA to maintain balance, pt weak in bilat knees with buckling, modAx2 to sequence stepping to the L, pt with decreased R LE WBing due to R hip pain. modA for walker management    Balance Overall balance assessment: Needs assistance Sitting-balance support: Bilateral upper extremity supported;Feet supported Sitting balance-Leahy Scale: Fair     Standing balance support: Bilateral upper extremity supported Standing balance-Leahy Scale: Poor Standing balance comment: dependent on physical assist                           ADL either performed or assessed with clinical judgement   ADL Overall ADL's : Needs assistance/impaired Eating/Feeding: Set up;Supervision/ safety;Sitting   Grooming: Wash/dry face;Set up;Supervision/safety;Sitting Grooming Details (indicate cue type and reason): Washing face awhile seated in recliner. Requiring cues for initating and increase occupational participation Upper Body Bathing: Moderate assistance;Sitting   Lower Body Bathing: Maximal assistance;Sit to/from stand   Upper Body Dressing : Moderate assistance;Sitting   Lower Body Dressing: Maximal assistance;Sit to/from stand Lower Body Dressing Details (indicate cue type and reason): Max A to don socks at [employer]: Moderate assistance;Stand-pivot(two person; simulated to recliner) Toilet Transfer Details (indicate cue type and reason): Mod A +2 for transition to left Toileting- Clothing Manipulation and Hygiene: Maximal assistance;+2 for safety/equipment;Bed level Toileting - Clothing Manipulation Details (indicate cue type and reason): Mod A for rolling and then Max  A for toilet hygiene with BM in bed     Functional mobility during ADLs: Moderate assistance;+2  for physical assistance;+2 for safety/equipment(stand pivot only) General ADL Comments: Pt with decreased balance, strength, and cognition     Vision Baseline Vision/History: Wears glasses Wears Glasses: Reading only Patient Visual Report: No change from baseline     Perception     Praxis      Cognition Arousal/Alertness: Lethargic(pt had oxycodone prior to treatment) Behavior During Therapy: Flat affect Overall Cognitive Status: Within Functional Limits for tasks assessed                                 General Comments: history of dementia and memory deficits. Following simple cues        Exercises Exercises: General Upper Extremity General Exercises - Upper Extremity Digit Composite Flexion: AROM;Both;10 reps;Seated Composite Extension: AROM;Left;10 reps;Seated    Shoulder Instructions       General Comments pt con't to have bilat foot swelling and L UE swelling    Pertinent Vitals/ Pain       Pain Assessment: 0-10 Pain Score: 10-Worst pain ever Pain Location: back Pain Descriptors / Indicators: Constant;Discomfort;Grimacing Pain Intervention(s): Monitored during session;Limited activity within patient's tolerance;Premedicated before session;Repositioned  Home Living Family/patient expects to be discharged to:: Private residence Living Arrangements: Spouse/significant other Available Help at Discharge: Family;Available 24 hours/day Type of Home: House Home Access: Stairs to enter [employer] of Steps: 3 Entrance Stairs-Rails: Right;Left Home Layout: Able to live on main level with bedroom/bathroom     Bathroom Shower/Tub: Occupational psychologist: Standard Bathroom Accessibility: Yes   Home Equipment: Walker - 4 wheels          Prior Functioning/Environment Level of Independence: Independent with assistive device(s)        Comments: amb with rollator . Performed ADLs independently   Frequency  Min 2X/week         Progress Toward Goals  OT Goals(current goals can now be found in the care plan section)     Acute Rehab OT Goals Patient Stated Goal: get stronger and manage pain OT Goal Formulation: With patient Time For Goal Achievement: 01/11/18 Potential to Achieve Goals: Good ADL Goals Pt Will Perform Grooming: standing;with min guard assist Pt Will Perform Upper Body Dressing: with set-up;with supervision;sitting Pt Will Perform Lower Body Dressing: with min guard assist;sit to/from stand Pt Will Transfer to Toilet: with min assist;ambulating;bedside commode Pt Will Perform Toileting - Clothing Manipulation and hygiene: with min assist;sit to/from stand Additional ADL Goal #1: Pt will perform bed mobility with MIn Guard A in preparation for ADLs  Plan      Co-evaluation    PT/OT/SLP Co-Evaluation/Treatment: Yes Reason for Co-Treatment: For patient/therapist safety;To address functional/ADL transfers PT goals addressed during session: Mobility/safety with mobility OT goals addressed during session: ADL's and self-care      AM-PAC PT "6 Clicks" Daily Activity     Outcome Measure   Help from another person eating meals?: A Little Help from another person taking care of personal grooming?: A Little Help from another person toileting, which includes using toliet, bedpan, or urinal?: A Lot Help from another person bathing (including washing, rinsing, drying)?: A Lot Help from another person to put on and taking off regular upper body clothing?: A Lot Help from another person to put on and taking off regular lower body clothing?: A Lot 6 Click Score:  14    End of Session Equipment Utilized During Treatment: Gait belt;Oxygen  OT Visit Diagnosis: Unsteadiness on feet (R26.81);Other abnormalities of gait and mobility (R26.89);Muscle weakness (generalized) (M62.81);Pain Pain - part of body: (Back)   Activity Tolerance Patient tolerated treatment well   Patient Left in  chair;with call bell/phone within reach;with chair alarm set;with family/visitor present(with PT)   Nurse Communication Mobility status        Time: 8241-7530 OT Time Calculation (min): 21 min  Charges: OT General Charges $OT Visit: 1 Visit OT Evaluation $OT Eval Moderate Complexity: [place], OTR/L Acute Rehab Pager: [phone] Office: [place] 12/28/2017, 1:19 PM

## 2017-12-28 NOTE — Progress Notes (Signed)
Inpatient Rehabilitation Admissions Coordinator  PT and OT are recommending an inpt rehab consult for pt to be considered for an admission. If patient would like to be evaluated, please place an order for inpt rehab consult.  Danne Baxter, RN, MSN Rehab Admissions Coordinator [phone] 12/28/2017 3:25 PM

## 2017-12-28 NOTE — Progress Notes (Signed)
[facility] for Infectious Disease  Date of Admission:  12/23/2017   Total days of antibiotics: 6                ASSESSMENT: [name] appears to be improving in terms of his E. Coli urosepsis. Repeat cultures have been negative and he has been afebrile since admission. Renal ultrasound was unchanged from previous ultrasounds and CT abdomen did not show any hydronephrosis.   Since there is no renal involvement from an infectious standpoint, a 7 day course treatment is adequate. Although antibiotics have changed since admission, each has covered E.coli. [name] will receive his 7th dose of antibiotic on 12/29/2017.   We discussed what family should do in the future, should patient have any more shaking chills once this current infection is treated. If wife/daughter notice his chills are becoming more prominent or worsening, it would be best to have [name] checked out to avoid future urosepsis episodes.   PLAN: 1. Continue Ceftriaxone through tomorrow.   Principal Problem:   E coli bacteremia Active Problems:   Sepsis ([place])   Ventricular tachycardia (HCC)   Pressure injury of skin   Urinary tract infection   Acute pulmonary edema (HCC)   Drug-induced torsades de pointes   Atrial fibrillation with rapid ventricular response (HCC)   Acute respiratory failure with hypoxemia (HCC)   Aspiration pneumonia of right upper lobe due to gastric secretions (HCC)   Scheduled Meds: . acetaminophen  650 mg Oral Q6H  . apixaban  2.5 mg Oral BID  . atorvastatin  20 mg Oral Daily  . donepezil  10 mg Oral QHS  . DULoxetine  30 mg Oral QHS  . ferrous sulfate  325 mg Oral BID WC  . folic acid  1 mg Oral QHS  . furosemide  40 mg Intravenous Daily  . lidocaine  1 patch Transdermal Q24H  . metoprolol succinate  25 mg Oral QHS  . pantoprazole  40 mg Oral QHS   Continuous Infusions: . sodium chloride Stopped (12/27/17 1809)  . cefTRIAXone (ROCEPHIN)  IV Stopped (12/27/17  1741)   PRN Meds:.sodium chloride, Gerhardt's butt cream, oxyCODONE-acetaminophen   SUBJECTIVE: [name] has been improving overall. He was able to get up and go to his chair today with the assistance of PT. In addition, he has been more alert. [name] has also been able to lower his oxygen requirement. There are plans for him to be moved to a step down unit either tomorrow or today.   [name] family is concerned about continued shaking chills that have been worsening since being admitted. They describe the chills as sudden onset, with severe shaking and a cold sweat. [name]'s heart rate increase as well. These episodes last approximately 20 minutes. Afterwards, [name] will feel much warmer, and then fatigued, "passing out for 2 hours." He is responsive during these episodes and is able to answer questions. He is afebrile when this occurs.   No hematuria in the last 24 hours per family.   Review of Systems: Review of Systems  Constitutional: Positive for chills and malaise/fatigue. Negative for fever.  Respiratory: Positive for shortness of breath (But improving overall. ).   Cardiovascular: Negative for chest pain.  Gastrointestinal: Negative for abdominal pain, nausea and vomiting.    Allergies  Allergen Reactions  . Ciprofloxacin Nausea Only and Other (See Comments)    Stomach ache  . Hydrocodone-Acetaminophen Other (See Comments)  Hallucinations in higher doses  . Other Other (See Comments)    Rash from neoprene wrap after knee surgery  . Shellfish Allergy Nausea And Vomiting and Other (See Comments)    Reaction to scallops  . Tramadol Other (See Comments)    Pt becomes combative per wife    OBJECTIVE: Vitals:   12/28/17 0600 12/28/17 0700 12/28/17 0736 12/28/17 0742  BP: (!) 126/53 (!) 141/75    Pulse: (!) 46 (!) 42    Resp: 13 (!) 29    Temp:    97.8 F (36.6 C)  TempSrc:    Oral  SpO2: 100% 100% 100%   Weight:      Height:       Body mass  index is 26.07 kg/m.  Physical Exam  Constitutional: He appears well-developed and well-nourished. No distress.  Cardiovascular: Normal rate. An irregularly irregular rhythm present.  No murmur heard. Pulmonary/Chest: Effort normal and breath sounds normal.  Abdominal: Soft. Bowel sounds are normal.  Skin: He is not diaphoretic.    Lab Results Lab Results  Component Value Date   WBC 14.4 (H) 12/28/2017   HGB 10.1 (L) 12/28/2017   HCT 32.8 (L) 12/28/2017   MCV 89.4 12/28/2017   PLT 163 12/28/2017    Lab Results  Component Value Date   CREATININE 1.49 (H) 12/28/2017   BUN 38 (H) 12/28/2017   NA 141 12/28/2017   K 3.6 12/28/2017   CL 107 12/28/2017   CO2 25 12/28/2017    Lab Results  Component Value Date   ALT 125 (H) 12/24/2017   AST 145 (H) 12/24/2017   ALKPHOS 90 12/24/2017   BILITOT 0.8 12/24/2017     Microbiology: Recent Results (from the past 240 hour(s))  Blood Culture (routine x 2)     Status: Abnormal   Collection Time: 12/23/17  8:13 AM  Result Value Ref Range Status   Specimen Description BLOOD RIGHT ANTECUBITAL  Final   Special Requests   Final    BOTTLES DRAWN AEROBIC AND ANAEROBIC Blood Culture adequate volume   Culture  Setup Time   Final    GRAM NEGATIVE RODS IN BOTH AEROBIC AND ANAEROBIC BOTTLES CRITICAL RESULT CALLED TO, READ BACK BY AND VERIFIED WITHFerne Coe [facility] 2126 12/23/17 A BROWNING Performed at [facility], [place] [address], [place], [place] [address]    Culture ESCHERICHIA COLI (A)  Final   Report Status 12/25/2017 FINAL  Final   Organism ID, Bacteria ESCHERICHIA COLI  Final      Susceptibility   Escherichia coli - MIC*    AMPICILLIN >=32 RESISTANT Resistant     CEFAZOLIN <=4 SENSITIVE Sensitive     CEFEPIME <=1 SENSITIVE Sensitive     CEFTAZIDIME <=1 SENSITIVE Sensitive     CEFTRIAXONE <=1 SENSITIVE Sensitive     CIPROFLOXACIN <=0.25 SENSITIVE Sensitive     GENTAMICIN <=1 SENSITIVE Sensitive     IMIPENEM <=0.25 SENSITIVE  Sensitive     TRIMETH/SULFA <=20 SENSITIVE Sensitive     AMPICILLIN/SULBACTAM >=32 RESISTANT Resistant     PIP/TAZO <=4 SENSITIVE Sensitive     Extended ESBL NEGATIVE Sensitive     * ESCHERICHIA COLI  Blood Culture ID Panel (Reflexed)     Status: Abnormal   Collection Time: 12/23/17  8:13 AM  Result Value Ref Range Status   Enterococcus species NOT DETECTED NOT DETECTED Final   Listeria monocytogenes NOT DETECTED NOT DETECTED Final   Staphylococcus species NOT DETECTED NOT DETECTED Final  Staphylococcus aureus NOT DETECTED NOT DETECTED Final   Streptococcus species NOT DETECTED NOT DETECTED Final   Streptococcus agalactiae NOT DETECTED NOT DETECTED Final   Streptococcus pneumoniae NOT DETECTED NOT DETECTED Final   Streptococcus pyogenes NOT DETECTED NOT DETECTED Final   Acinetobacter baumannii NOT DETECTED NOT DETECTED Final   Enterobacteriaceae species DETECTED (A) NOT DETECTED Final    Comment: Enterobacteriaceae represent a large family of gram-negative bacteria, not a single organism. CRITICAL RESULT CALLED TO, READ BACK BY AND VERIFIED WITH: E MARTIN PHARMD 2126 12/23/17 A BROWNING    Enterobacter cloacae complex NOT DETECTED NOT DETECTED Final   Escherichia coli DETECTED (A) NOT DETECTED Final    Comment: CRITICAL RESULT CALLED TO, READ BACK BY AND VERIFIED WITH: E MARTIN PHARMD 2126 12/23/17 A BROWNING    Klebsiella oxytoca NOT DETECTED NOT DETECTED Final   Klebsiella pneumoniae NOT DETECTED NOT DETECTED Final   Proteus species NOT DETECTED NOT DETECTED Final   Serratia marcescens NOT DETECTED NOT DETECTED Final   Carbapenem resistance NOT DETECTED NOT DETECTED Final   Haemophilus influenzae NOT DETECTED NOT DETECTED Final   Neisseria meningitidis NOT DETECTED NOT DETECTED Final   Pseudomonas aeruginosa NOT DETECTED NOT DETECTED Final   Candida albicans NOT DETECTED NOT DETECTED Final   Candida glabrata NOT DETECTED NOT DETECTED Final   Candida krusei NOT DETECTED NOT  DETECTED Final   Candida parapsilosis NOT DETECTED NOT DETECTED Final   Candida tropicalis NOT DETECTED NOT DETECTED Final    Comment: Performed at [facility], [place] [address], [place], [place] [address]  Blood Culture (routine x 2)     Status: Abnormal   Collection Time: 12/23/17  8:30 AM  Result Value Ref Range Status   Specimen Description BLOOD LEFT HAND  Final   Special Requests   Final    BOTTLES DRAWN AEROBIC AND ANAEROBIC Blood Culture results may not be optimal due to an inadequate volume of blood received in culture bottles   Culture  Setup Time   Final    GRAM NEGATIVE RODS IN BOTH AEROBIC AND ANAEROBIC BOTTLES CRITICAL VALUE NOTED.  VALUE IS CONSISTENT WITH PREVIOUSLY REPORTED AND CALLED VALUE.    Culture (A)  Final    ESCHERICHIA COLI SUSCEPTIBILITIES PERFORMED ON PREVIOUS CULTURE WITHIN THE LAST 5 DAYS. Performed at [facility], [place] [address] [place], [place] [address]    Report Status 12/25/2017 FINAL  Final  Urine culture     Status: Abnormal   Collection Time: 12/23/17  8:44 AM  Result Value Ref Range Status   Specimen Description URINE, CATHETERIZED  Final   Special Requests   Final    NONE Performed at [facility], [place] [address], [place], [place] [address]    Culture >=100,000 COLONIES/mL ESCHERICHIA COLI (A)  Final   Report Status 12/25/2017 FINAL  Final   Organism ID, Bacteria ESCHERICHIA COLI (A)  Final      Susceptibility   Escherichia coli - MIC*    AMPICILLIN >=32 RESISTANT Resistant     CEFAZOLIN 16 SENSITIVE Sensitive     CEFTRIAXONE <=1 SENSITIVE Sensitive     CIPROFLOXACIN <=0.25 SENSITIVE Sensitive     GENTAMICIN <=1 SENSITIVE Sensitive     IMIPENEM <=0.25 SENSITIVE Sensitive     NITROFURANTOIN <=16 SENSITIVE Sensitive     TRIMETH/SULFA <=20 SENSITIVE Sensitive     AMPICILLIN/SULBACTAM >=32 RESISTANT Resistant     PIP/TAZO <=4 SENSITIVE Sensitive     Extended ESBL NEGATIVE Sensitive     * >=  100,000 COLONIES/mL  ESCHERICHIA COLI  MRSA PCR Screening     Status: None   Collection Time: 12/23/17  9:06 PM  Result Value Ref Range Status   MRSA by PCR NEGATIVE NEGATIVE Final    Comment:        The GeneXpert MRSA Assay (FDA approved for NASAL specimens only), is one component of a comprehensive MRSA colonization surveillance program. It is not intended to diagnose MRSA infection nor to guide or monitor treatment for MRSA infections. Performed at [facility], [place] [address] [place], [place] [address]   Culture, blood (Routine X 2) w Reflex to ID Panel     Status: None (Preliminary result)   Collection Time: 12/24/17 12:15 PM  Result Value Ref Range Status   Specimen Description BLOOD BLOOD LEFT HAND  Final   Special Requests   Final    BOTTLES DRAWN AEROBIC ONLY Blood Culture results may not be optimal due to an inadequate volume of blood received in culture bottles   Culture   Final    NO GROWTH 3 DAYS Performed at [facility], [place] [address] [place], [place] [address]    Report Status PENDING  Incomplete  Culture, blood (Routine X 2) w Reflex to ID Panel     Status: None (Preliminary result)   Collection Time: 12/24/17 12:15 PM  Result Value Ref Range Status   Specimen Description BLOOD BLOOD RIGHT HAND  Final   Special Requests   Final    BOTTLES DRAWN AEROBIC AND ANAEROBIC Blood Culture adequate volume   Culture   Final    NO GROWTH 3 DAYS Performed at [facility], [place] [address] [place], [place] [address]    Report Status PENDING  Incomplete    Jose Persia, MS4 Dr. Michel Bickers, MD  [facility] for [place] [phone] pager   [phone] cell 12/28/2017, 10:14 AM

## 2017-12-28 NOTE — Progress Notes (Signed)
PULMONARY / CRITICAL CARE MEDICINE   Name: Christopher Baker MRN: [id number] DOB: 12-19-1933    ADMISSION DATE:  12/23/2017  REFERRING MD:  Renne Crigler  CHIEF COMPLAINT:  VT arrest  HISTORY OF PRESENT ILLNESS:   84 y/o male with a past medical history of CHF was admitted with sepsis, had a cardiac arrest afterwards.    SUBJECTIVE:  Feels much better. O2 down to 2 litres. Still complains of soreness in his chest and back.   VITAL SIGNS: BP (!) 141/75   Pulse (!) 42   Temp 97.8 F (36.6 C) (Oral)   Resp (!) 29   Ht 6\' 3"  (1.905 m)   Wt 94.6 kg (208 lb 8.9 oz)   SpO2 100%   BMI 26.07 kg/m   HEMODYNAMICS:    VENTILATOR SETTINGS:    INTAKE / OUTPUT: I/O last 3 completed shifts: In: 865 [P.O.:660; I.V.:5; IV Piggyback:200] Out: 3400 [Urine:3400]  PHYSICAL EXAMINATION:  General: acutely ill  HENT: NCAT OP clear PULM: clear no crackles no wheezing  normal effort CV: no murmurs irregular controlled rat  GI: BS+, soft, nontender MSK: normal bulk and tone + edema  Neuro: lethargic mumbling words      LABS:  BMET Recent Labs  Lab 12/26/17 0314 12/27/17 0303 12/28/17 0156  NA 143 142 141  K 3.6 4.0 3.6  CL 109 109 107  CO2 25 17* 25  BUN 35* 39* 38*  CREATININE 1.49* 1.57* 1.49*  GLUCOSE 135* 124* 128*    Electrolytes Recent Labs  Lab 12/23/17 2205  12/24/17 0923  12/25/17 0334 12/26/17 0314 12/27/17 0303 12/28/17 0156  CALCIUM 8.9   < > 9.2   < > 9.0 9.0 9.0 9.1  MG 2.0  --  2.0  --  2.1  --   --   --   PHOS 4.1  --   --   --   --   --   --   --    < > = values in this interval not displayed.    CBC Recent Labs  Lab 12/26/17 0314 12/27/17 0303 12/28/17 0156  WBC 15.1* 11.9* 14.4*  HGB 9.8* 10.3* 10.1*  HCT 31.8* 32.8* 32.8*  PLT 139* 131* 163    Coag's No results for input(s): APTT, INR in the last 168 hours.  Sepsis Markers Recent Labs  Lab 12/23/17 1204 12/23/17 2205 12/23/17 2355 12/24/17 0342 12/25/17 0334  LATICACIDVEN 2.6*  5.6* 4.0*  --   --   PROCALCITON  --   --  44.53 44.22 32.00    ABG Recent Labs  Lab 12/23/17 1054 12/23/17 2340 12/26/17 1009  PHART 7.440 7.440 7.447  PCO2ART 29.4* 26.8* 28.0*  PO2ART 104.0 73.5* 73.0*    Liver Enzymes Recent Labs  Lab 12/23/17 0813 12/24/17 0342  AST 31 145*  ALT 28 125*  ALKPHOS 85 90  BILITOT 0.8 0.8  ALBUMIN 3.1* 2.5*    Cardiac Enzymes Recent Labs  Lab 12/23/17 2205 12/24/17 0342 12/24/17 0853  TROPONINI 1.55* 1.55* 1.18*    Glucose Recent Labs  Lab 12/27/17 0744 12/27/17 1210 12/27/17 2014 12/28/17 0007 12/28/17 0350 12/28/17 0744  GLUCAP 122* 123* 132* 117* 116* 121*    Imaging Ct Abdomen Wo Contrast  Result Date: 12/27/2017 CLINICAL DATA:  Nonpulsatile abdominal mass. Recent fall. Improving pulmonary opacities with diuresis. EXAM: CT CHEST AND ABDOMEN WITHOUT CONTRAST TECHNIQUE: Multidetector CT imaging of the chest and abdomen was performed following the standard protocol without intravenous contrast. COMPARISON:  Recent chest radiographs. Pelvic CT 03/08/2016, chest CT 06/25/2015 and abdominopelvic CT 02/25/2015. FINDINGS: CT CHEST FINDINGS WITHOUT CONTRAST Cardiovascular: Diffuse atherosclerosis of the aorta, great vessels and coronary arteries. No acute vascular findings are seen on noncontrast imaging. The heart is mildly enlarged. Patient has a left subclavian pacemaker with leads in stable position. Probable aortic valvular calcifications. No significant pericardial effusion. Mediastinum/Nodes: There are no enlarged mediastinal, hilar or axillary lymph nodes. Small mediastinal lymph nodes are stable. The esophagus and thyroid gland appear unremarkable. Lungs/Pleura: Small dependent bilateral pleural effusions with associated bibasilar atelectasis. There is asymmetric interstitial prominence throughout the right lung as seen on recent radiographs. There is underlying moderate centrilobular emphysema. No focal nodule or endobronchial  lesion seen. Musculoskeletal/Chest wall: No chest wall mass or suspicious osseous findings. A suggested midsternal fracture on sagittal image 110/7 is attributed to breathing artifact based on the axial images and is not associated with any surrounding soft tissue swelling. Right shoulder reverse arthroplasty and muscular atrophy noted. CT ABDOMEN FINDINGS Hepatobiliary: Multiple hepatic cysts appear stable. There is no significant biliary dilatation status post cholecystectomy. Pancreas: Unremarkable. No pancreatic ductal dilatation or surrounding inflammatory changes. Spleen: Normal in size without focal abnormality. Adrenals/Urinary Tract: Both adrenal glands appear normal. Stable nonobstructing 5 mm calculus in the lower pole of the right kidney (image 82/3). No evidence of hydronephrosis, proximal ureteral calculus or asymmetric perinephric soft tissue stranding. Numerous bilateral renal cysts are grossly unchanged in size and appearance. Stomach/Bowel: No evidence of bowel wall thickening, distention or surrounding inflammatory change. Vascular/Lymphatic: There are no enlarged abdominal lymph nodes. There are stable small lymph nodes in the porta hepatis. There is diffuse aortic and branch vessel atherosclerosis. Other: The visualized anterior abdominal wall is intact. No ascites. No abdominal wall mass or hernia seen. Musculoskeletal: No acute or significant osseous findings. Multilevel spondylosis status post L4-5 fusion. Disc degeneration appears progressive at L2-3 and L3-4. IMPRESSION: 1. In correlation with recent chest radiographs, asymmetric interstitial opacities in the right lung, bilateral pleural effusions and bibasilar atelectasis probably reflect resolving pulmonary edema superimposed on emphysema. Continued chest radiographic follow up recommended. Emphysema (ICD10-J43.9). 2. No acute findings seen within the abdomen. 3. Stable nonobstructing right renal calculus and bilateral renal cysts. No  hydronephrosis or acute inflammatory changes seen. 4. Aortic Atherosclerosis (ICD10-I70.0). Progressive lumbar disc degeneration. 5. No evidence of abdominal mass or definite acute osseous findings. Suggested sternal fracture on the sagittal images is attributed to breathing artifact. Correlate clinically. Electronically Signed   By: Richardean Sale M.D.   On: 12/27/2017 17:36   Ct Chest Wo Contrast  Result Date: 12/27/2017 CLINICAL DATA:  Nonpulsatile abdominal mass. Recent fall. Improving pulmonary opacities with diuresis. EXAM: CT CHEST AND ABDOMEN WITHOUT CONTRAST TECHNIQUE: Multidetector CT imaging of the chest and abdomen was performed following the standard protocol without intravenous contrast. COMPARISON:  Recent chest radiographs. Pelvic CT 03/08/2016, chest CT 06/25/2015 and abdominopelvic CT 02/25/2015. FINDINGS: CT CHEST FINDINGS WITHOUT CONTRAST Cardiovascular: Diffuse atherosclerosis of the aorta, great vessels and coronary arteries. No acute vascular findings are seen on noncontrast imaging. The heart is mildly enlarged. Patient has a left subclavian pacemaker with leads in stable position. Probable aortic valvular calcifications. No significant pericardial effusion. Mediastinum/Nodes: There are no enlarged mediastinal, hilar or axillary lymph nodes. Small mediastinal lymph nodes are stable. The esophagus and thyroid gland appear unremarkable. Lungs/Pleura: Small dependent bilateral pleural effusions with associated bibasilar atelectasis. There is asymmetric interstitial prominence throughout the right lung as seen on recent radiographs. There  is underlying moderate centrilobular emphysema. No focal nodule or endobronchial lesion seen. Musculoskeletal/Chest wall: No chest wall mass or suspicious osseous findings. A suggested midsternal fracture on sagittal image 110/7 is attributed to breathing artifact based on the axial images and is not associated with any surrounding soft tissue swelling.  Right shoulder reverse arthroplasty and muscular atrophy noted. CT ABDOMEN FINDINGS Hepatobiliary: Multiple hepatic cysts appear stable. There is no significant biliary dilatation status post cholecystectomy. Pancreas: Unremarkable. No pancreatic ductal dilatation or surrounding inflammatory changes. Spleen: Normal in size without focal abnormality. Adrenals/Urinary Tract: Both adrenal glands appear normal. Stable nonobstructing 5 mm calculus in the lower pole of the right kidney (image 82/3). No evidence of hydronephrosis, proximal ureteral calculus or asymmetric perinephric soft tissue stranding. Numerous bilateral renal cysts are grossly unchanged in size and appearance. Stomach/Bowel: No evidence of bowel wall thickening, distention or surrounding inflammatory change. Vascular/Lymphatic: There are no enlarged abdominal lymph nodes. There are stable small lymph nodes in the porta hepatis. There is diffuse aortic and branch vessel atherosclerosis. Other: The visualized anterior abdominal wall is intact. No ascites. No abdominal wall mass or hernia seen. Musculoskeletal: No acute or significant osseous findings. Multilevel spondylosis status post L4-5 fusion. Disc degeneration appears progressive at L2-3 and L3-4. IMPRESSION: 1. In correlation with recent chest radiographs, asymmetric interstitial opacities in the right lung, bilateral pleural effusions and bibasilar atelectasis probably reflect resolving pulmonary edema superimposed on emphysema. Continued chest radiographic follow up recommended. Emphysema (ICD10-J43.9). 2. No acute findings seen within the abdomen. 3. Stable nonobstructing right renal calculus and bilateral renal cysts. No hydronephrosis or acute inflammatory changes seen. 4. Aortic Atherosclerosis (ICD10-I70.0). Progressive lumbar disc degeneration. 5. No evidence of abdominal mass or definite acute osseous findings. Suggested sternal fracture on the sagittal images is attributed to breathing  artifact. Correlate clinically. Electronically Signed   By: Richardean Sale M.D.   On: 12/27/2017 17:36   US Renal  Result Date: 12/27/2017 CLINICAL DATA:  84 year old with personal history of prostate cancer, presenting with urinary tract infection and renal insufficiency. Current history of hypertension and diabetes. EXAM: RENAL / URINARY TRACT ULTRASOUND COMPLETE COMPARISON:  CT abdomen and pelvis 02/25/2015. FINDINGS: Right Kidney: Length: Approximately 10.9 cm. Echogenic renal parenchyma. Approximate 1.8 x 1.9 x 1.2 cm cyst arising from the UPPER pole, with smaller cysts elsewhere in the kidney, as noted on the prior CT. No visible solid masses. No hydronephrosis. Left Kidney: Length: Approximately 12.7 cm. Echogenic renal parenchyma. Multiple cysts as noted on the prior CT, the largest arising from the mid kidney measuring approximately 4.0 x 3.4 x 4.7 cm. No visible solid masses. No hydronephrosis. Bladder: Decompressed by Foley catheter. IMPRESSION: 1. Echogenic renal parenchyma bilaterally indicating medical renal disease. 2. No evidence of hydronephrosis involving either kidney to suggest obstruction. 3. Multiple BILATERAL renal cysts as noted on a prior CT in 2016. Electronically Signed   By: Evangeline Dakin M.D.   On: 12/27/2017 12:42   Dg Chest Port 1 View  Result Date: 12/28/2017 CLINICAL DATA:  Short of breath, respiratory failure EXAM: PORTABLE CHEST 1 VIEW COMPARISON:  12/27/2017 FINDINGS: LEFT-sided pacemaker overlies normal cardiac silhouette. Bilateral small effusions. Mild interstitial edema pattern. No pneumothorax. No focal consolidation. IMPRESSION: Bilateral pleural effusions and mild interstitial edema Electronically Signed   By: Suzy Bouchard M.D.   On: 12/28/2017 08:47     STUDIES:  2018 Echo> LVEF 20-25%, mild to moderate AS, mild MR, LAE, RAE  CULTURES: 7/5 urine > GNR 7/5 Blood >  e coli  ANTIBIOTICS: 7/5 zosy > x1 7/5 vanc x1 7/5 ceftriaxone > 7/7 7/7 zosyn >    SIGNIFICANT EVENTS: 7/5 late PM VT arrest, CPR, no ventilation 7/6 late PM zosyn  LINES/TUBES:   DISCUSSION: 84 y/o male with urinary sepsis causing e coli bacteremia received crystalloid resuscitation and developed acute pulmonary edema and a VT arrest late in the evening of 7/5.  Had torsades type rhythm abnormality.  He had some aspiraiton   ASSESSMENT / PLAN:  PULMONARY A: Acute respiratory failure with hypoxemia Acute pulmonary edema  Aspiration pneumonitis P:   Wean O2 as tolerated  Continue [place]  O2 to maintain O2 saturation > 90% now on 2 litres   CARDIOVASCULAR A:  Acute on chronic systolic heart failure Afib with RVR  VT arrest Aortic stenosis P:  Tele Continue  lasix 40mg  daily  Appreciate cards follow up  Echo 25-30%    RENAL A:   Acute on chronic renal failure, baseline Cr 1.1-1.4 P:   Monitor BMET and UOP Replace electrolytes as needed   GASTROINTESTINAL A:   Dysphagia P:   Diet as tolerated  Patient has loose bowel motion which is chronic.  I will  not restart imodium due to risk for torsades   HEMATOLOGIC A:   Anemia without bleeding P:  Monitor for bleeding Transfuse PRBC for Hgb < 7 gm/dL   INFECTIOUS A:   E coli bacteremia from UTI Aspiration pneumonitis P:   On ceftriaxone as per ID   ID follow up is appreciated  US kidney no abscess  CT abd no hydronephrosis   ENDOCRINE A:   Hyperglycemia P:   SSI  NEUROLOGIC A:   No acute issues P:   Minimize sedating medicine  FAMILY  - Updates: 7/10 wife updated bedside. Patient is no CPR but ok for intubation   - Inter-disciplinary family meet or Palliative Care meeting due by:  Day 7 Patient can be downgraded to a step down    Silver Huguenin , MD [facility] [phone]   12/28/2017, 8:56 AM

## 2017-12-28 NOTE — Progress Notes (Signed)
Physical Therapy Treatment Patient Details Name: Christopher Baker MRN: [id number] DOB: 05/05/1934 Today's Date: 12/28/2017    History of Present Illness Christopher Baker is a 84 y.o. male with medical history significant of paroxysmal A. fib currently has a pacemaker, prior CVA, chronic systolic CHF with an EF of 25%, mild dementia, hypertension, hyperlipidemia, who is being brought to the hospital by his wife after she found him early this morning being confused and poorly responsive.     PT Comments    Pt with increased lethargy most likely from pain medication however continued to participate in PT and demonstrated improved transfer mobility and was able to tolerate mobility without O2 today. Pt remains very deconditioned and would continue to benefit from CIR upon discharge to maximize functional return to minimize burden on wife.   Follow Up Recommendations  CIR     Equipment Recommendations       Recommendations for Other Services Speech consult     Precautions / Restrictions Precautions Precautions: Fall Restrictions Weight Bearing Restrictions: No Other Position/Activity Restrictions: self R UE limiting due to R flank pain and solid bump/mass at bottum of rib cage    Mobility  Bed Mobility Overal bed mobility: Needs Assistance Bed Mobility: Supine to Sit     Supine to sit: Max assist;HOB elevated     General bed mobility comments: pt with R flank pain, initiated LE movement to EOB, due to chest/flank pain, maxA for trunk elevation required  Transfers Overall transfer level: Needs assistance Equipment used: 2 person hand held assist Transfers: Sit to/from [facility] Sit to Stand: Mod assist;+2 physical assistance Stand pivot transfers: Mod assist;+2 physical assistance       General transfer comment: pt able to push up to erect posture, modA to maintain balance, pt weak in bilat knees with buckling, modAx2 to sequence stepping to the L, pt with  decreased R LE WBing due to R hip pain. modA for walker management  Ambulation/Gait             General Gait Details: took 5 steps to chair during std pvt  with RW   Stairs             Wheelchair Mobility    Modified Rankin (Stroke Patients Only)       Balance Overall balance assessment: Needs assistance Sitting-balance support: Bilateral upper extremity supported;Feet supported Sitting balance-Leahy Scale: Fair     Standing balance support: Bilateral upper extremity supported Standing balance-Leahy Scale: Poor Standing balance comment: dependent on physical assist                            Cognition Arousal/Alertness: Lethargic(pt had oxycodone prior to treatment) Behavior During Therapy: Flat affect Overall Cognitive Status: Within Functional Limits for tasks assessed                                 General Comments: history of dementia and memory deficits      Exercises General Exercises - Lower Extremity Ankle Circles/Pumps: AROM;Both;10 reps;Seated Long Arc Quad: AROM;Both;10 reps;Seated Hip ABduction/ADduction: Both;AROM;10 reps;Seated Hip Flexion/Marching: AROM;Both;10 reps;Seated    General Comments General comments (skin integrity, edema, etc.): pt con't to have bilat foot swelling and L UE swelling      Pertinent Vitals/Pain Pain Assessment: 0-10 Pain Score: 10-Worst pain ever Pain Location: back Pain Intervention(s): Premedicated before session  Home Living                      Prior Function            PT Goals (current goals can now be found in the care plan section) Progress towards PT goals: Progressing toward goals    Frequency    Min 3X/week      PT Plan Current plan remains appropriate    Co-evaluation PT/OT/SLP Co-Evaluation/Treatment: Yes Reason for Co-Treatment: For patient/therapist safety PT goals addressed during session: Mobility/safety with mobility        AM-PAC  PT "6 Clicks" Daily Activity  Outcome Measure  Difficulty turning over in bed (including adjusting bedclothes, sheets and blankets)?: Unable Difficulty moving from lying on back to sitting on the side of the bed? : Unable Difficulty sitting down on and standing up from a chair with arms (e.g., wheelchair, bedside commode, etc,.)?: Unable Help needed moving to and from a bed to chair (including a wheelchair)?: Total Help needed walking in hospital room?: Total Help needed climbing 3-5 steps with a railing? : Total 6 Click Score: 6    End of Session Equipment Utilized During Treatment: Gait belt Activity Tolerance: Patient limited by fatigue Patient left: in chair;with call bell/phone within reach;with nursing/sitter in room;with family/visitor present Nurse Communication: Mobility status PT Visit Diagnosis: Unsteadiness on feet (R26.81);Pain;History of falling (Z91.81) Pain - Right/Left: Right Pain - part of body: Hip     Time: 2778-2423 PT Time Calculation (min) (ACUTE ONLY): 41 min  Charges:  $Therapeutic Exercise: 8-22 mins $Therapeutic Activity: 8-22 mins                    G Codes:       Christopher Baker, PT, DPT Pager #: [phone] Office #: [phone]    Christopher Baker 12/28/2017, 11:50 AM

## 2017-12-28 NOTE — Progress Notes (Signed)
Transferred patient and all belongings to [phone]. Wife at bedside. Patient alert and oriented x 3 and in stable condition. Bedside report given to [place], [place].

## 2017-12-28 NOTE — Progress Notes (Signed)
Progress Note  Patient Name: Christopher Baker Date of Encounter: 12/28/2017  Primary Cardiologist: No primary care provider on file.   Subjective   C/o lower back pain and chest pain with inspiration  Inpatient Medications    Scheduled Meds: . acetaminophen  650 mg Oral Q6H  . apixaban  2.5 mg Oral BID  . atorvastatin  20 mg Oral Daily  . donepezil  10 mg Oral QHS  . DULoxetine  30 mg Oral QHS  . ferrous sulfate  325 mg Oral BID WC  . folic acid  1 mg Oral QHS  . furosemide  40 mg Intravenous Daily  . lidocaine  1 patch Transdermal Q24H  . metoprolol succinate  25 mg Oral QHS  . pantoprazole  40 mg Oral QHS   Continuous Infusions: . sodium chloride Stopped (12/27/17 1809)  . cefTRIAXone (ROCEPHIN)  IV Stopped (12/27/17 1741)   PRN Meds: sodium chloride, Gerhardt's butt cream, oxyCODONE-acetaminophen   Vital Signs    Vitals:   12/28/17 0600 12/28/17 0700 12/28/17 0736 12/28/17 0742  BP: (!) 126/53 (!) 141/75    Pulse: (!) 46 (!) 42    Resp: 13 (!) 29    Temp:    97.8 F (36.6 C)  TempSrc:    Oral  SpO2: 100% 100% 100%   Weight:      Height:        Intake/Output Summary (Last 24 hours) at 12/28/2017 0929 Last data filed at 12/28/2017 0600 Gross per 24 hour  Intake 644.99 ml  Output 2050 ml  Net -1405.01 ml   Filed Weights   12/26/17 0600 12/27/17 0500 12/28/17 0500  Weight: 208 lb 8.9 oz (94.6 kg) 209 lb 10.5 oz (95.1 kg) 208 lb 8.9 oz (94.6 kg)    Telemetry    Atrial fib with a controlled VR and intermittent pacing - Personally Reviewed  ECG    none - Personally Reviewed  Physical Exam   GEN: No acute distress.   Neck: 6 cm JVD Cardiac: IRIRR, no murmurs, rubs, or gallops.  Respiratory: Clear to auscultation bilaterally. GI: Soft, nontender, non-distended  MS: No edema; No deformity. Neuro:  Nonfocal  Psych: Normal affect   Labs    Chemistry Recent Labs  Lab 12/23/17 0813  12/24/17 0342  12/26/17 0314 12/27/17 0303 12/28/17 0156    NA 142   < > 143   < > 143 142 141  K 3.7   < > 4.4   < > 3.6 4.0 3.6  CL 109   < > 112*   < > 109 109 107  CO2 22   < > 19*   < > 25 17* 25  GLUCOSE 127*   < > 163*   < > 135* 124* 128*  BUN 26*   < > 31*   < > 35* 39* 38*  CREATININE 1.37*   < > 1.61*   < > 1.49* 1.57* 1.49*  CALCIUM 9.4   < > 8.9   < > 9.0 9.0 9.1  PROT 6.0*  --  5.4*  --   --   --   --   ALBUMIN 3.1*  --  2.5*  --   --   --   --   AST 31  --  145*  --   --   --   --   ALT 28  --  125*  --   --   --   --   ALKPHOS 85  --  90  --   --   --   --   BILITOT 0.8  --  0.8  --   --   --   --   GFRNONAA 46*   < > 38*   < > 42* 39* 42*  GFRAA 54*   < > 44*   < > 49* 46* 49*  ANIONGAP 11   < > 12   < > 9 16* 9   < > = values in this interval not displayed.     Hematology Recent Labs  Lab 12/26/17 0314 12/27/17 0303 12/28/17 0156  WBC 15.1* 11.9* 14.4*  RBC 3.52* 3.77* 3.67*  HGB 9.8* 10.3* 10.1*  HCT 31.8* 32.8* 32.8*  MCV 90.3 87.0 89.4  MCH 27.8 27.3 27.5  MCHC 30.8 31.4 30.8  RDW 16.2* 16.2* 16.2*  PLT 139* 131* 163    Cardiac Enzymes Recent Labs  Lab 12/23/17 2205 12/24/17 0342 12/24/17 0853  TROPONINI 1.55* 1.55* 1.18*   No results for input(s): TROPIPOC in the last 168 hours.   BNPNo results for input(s): BNP, PROBNP in the last 168 hours.   DDimer No results for input(s): DDIMER in the last 168 hours.   Radiology    Ct Abdomen Wo Contrast  Result Date: 12/27/2017 CLINICAL DATA:  Nonpulsatile abdominal mass. Recent fall. Improving pulmonary opacities with diuresis. EXAM: CT CHEST AND ABDOMEN WITHOUT CONTRAST TECHNIQUE: Multidetector CT imaging of the chest and abdomen was performed following the standard protocol without intravenous contrast. COMPARISON:  Recent chest radiographs. Pelvic CT 03/08/2016, chest CT 06/25/2015 and abdominopelvic CT 02/25/2015. FINDINGS: CT CHEST FINDINGS WITHOUT CONTRAST Cardiovascular: Diffuse atherosclerosis of the aorta, great vessels and coronary arteries. No  acute vascular findings are seen on noncontrast imaging. The heart is mildly enlarged. Patient has a left subclavian pacemaker with leads in stable position. Probable aortic valvular calcifications. No significant pericardial effusion. Mediastinum/Nodes: There are no enlarged mediastinal, hilar or axillary lymph nodes. Small mediastinal lymph nodes are stable. The esophagus and thyroid gland appear unremarkable. Lungs/Pleura: Small dependent bilateral pleural effusions with associated bibasilar atelectasis. There is asymmetric interstitial prominence throughout the right lung as seen on recent radiographs. There is underlying moderate centrilobular emphysema. No focal nodule or endobronchial lesion seen. Musculoskeletal/Chest wall: No chest wall mass or suspicious osseous findings. A suggested midsternal fracture on sagittal image 110/7 is attributed to breathing artifact based on the axial images and is not associated with any surrounding soft tissue swelling. Right shoulder reverse arthroplasty and muscular atrophy noted. CT ABDOMEN FINDINGS Hepatobiliary: Multiple hepatic cysts appear stable. There is no significant biliary dilatation status post cholecystectomy. Pancreas: Unremarkable. No pancreatic ductal dilatation or surrounding inflammatory changes. Spleen: Normal in size without focal abnormality. Adrenals/Urinary Tract: Both adrenal glands appear normal. Stable nonobstructing 5 mm calculus in the lower pole of the right kidney (image 82/3). No evidence of hydronephrosis, proximal ureteral calculus or asymmetric perinephric soft tissue stranding. Numerous bilateral renal cysts are grossly unchanged in size and appearance. Stomach/Bowel: No evidence of bowel wall thickening, distention or surrounding inflammatory change. Vascular/Lymphatic: There are no enlarged abdominal lymph nodes. There are stable small lymph nodes in the porta hepatis. There is diffuse aortic and branch vessel atherosclerosis. Other:  The visualized anterior abdominal wall is intact. No ascites. No abdominal wall mass or hernia seen. Musculoskeletal: No acute or significant osseous findings. Multilevel spondylosis status post L4-5 fusion. Disc degeneration appears progressive at L2-3 and L3-4. IMPRESSION: 1. In correlation with recent chest radiographs, asymmetric interstitial opacities  in the right lung, bilateral pleural effusions and bibasilar atelectasis probably reflect resolving pulmonary edema superimposed on emphysema. Continued chest radiographic follow up recommended. Emphysema (ICD10-J43.9). 2. No acute findings seen within the abdomen. 3. Stable nonobstructing right renal calculus and bilateral renal cysts. No hydronephrosis or acute inflammatory changes seen. 4. Aortic Atherosclerosis (ICD10-I70.0). Progressive lumbar disc degeneration. 5. No evidence of abdominal mass or definite acute osseous findings. Suggested sternal fracture on the sagittal images is attributed to breathing artifact. Correlate clinically. Electronically Signed   By: Richardean Sale M.D.   On: 12/27/2017 17:36   Ct Chest Wo Contrast  Result Date: 12/27/2017 CLINICAL DATA:  Nonpulsatile abdominal mass. Recent fall. Improving pulmonary opacities with diuresis. EXAM: CT CHEST AND ABDOMEN WITHOUT CONTRAST TECHNIQUE: Multidetector CT imaging of the chest and abdomen was performed following the standard protocol without intravenous contrast. COMPARISON:  Recent chest radiographs. Pelvic CT 03/08/2016, chest CT 06/25/2015 and abdominopelvic CT 02/25/2015. FINDINGS: CT CHEST FINDINGS WITHOUT CONTRAST Cardiovascular: Diffuse atherosclerosis of the aorta, great vessels and coronary arteries. No acute vascular findings are seen on noncontrast imaging. The heart is mildly enlarged. Patient has a left subclavian pacemaker with leads in stable position. Probable aortic valvular calcifications. No significant pericardial effusion. Mediastinum/Nodes: There are no enlarged  mediastinal, hilar or axillary lymph nodes. Small mediastinal lymph nodes are stable. The esophagus and thyroid gland appear unremarkable. Lungs/Pleura: Small dependent bilateral pleural effusions with associated bibasilar atelectasis. There is asymmetric interstitial prominence throughout the right lung as seen on recent radiographs. There is underlying moderate centrilobular emphysema. No focal nodule or endobronchial lesion seen. Musculoskeletal/Chest wall: No chest wall mass or suspicious osseous findings. A suggested midsternal fracture on sagittal image 110/7 is attributed to breathing artifact based on the axial images and is not associated with any surrounding soft tissue swelling. Right shoulder reverse arthroplasty and muscular atrophy noted. CT ABDOMEN FINDINGS Hepatobiliary: Multiple hepatic cysts appear stable. There is no significant biliary dilatation status post cholecystectomy. Pancreas: Unremarkable. No pancreatic ductal dilatation or surrounding inflammatory changes. Spleen: Normal in size without focal abnormality. Adrenals/Urinary Tract: Both adrenal glands appear normal. Stable nonobstructing 5 mm calculus in the lower pole of the right kidney (image 82/3). No evidence of hydronephrosis, proximal ureteral calculus or asymmetric perinephric soft tissue stranding. Numerous bilateral renal cysts are grossly unchanged in size and appearance. Stomach/Bowel: No evidence of bowel wall thickening, distention or surrounding inflammatory change. Vascular/Lymphatic: There are no enlarged abdominal lymph nodes. There are stable small lymph nodes in the porta hepatis. There is diffuse aortic and branch vessel atherosclerosis. Other: The visualized anterior abdominal wall is intact. No ascites. No abdominal wall mass or hernia seen. Musculoskeletal: No acute or significant osseous findings. Multilevel spondylosis status post L4-5 fusion. Disc degeneration appears progressive at L2-3 and L3-4. IMPRESSION: 1.  In correlation with recent chest radiographs, asymmetric interstitial opacities in the right lung, bilateral pleural effusions and bibasilar atelectasis probably reflect resolving pulmonary edema superimposed on emphysema. Continued chest radiographic follow up recommended. Emphysema (ICD10-J43.9). 2. No acute findings seen within the abdomen. 3. Stable nonobstructing right renal calculus and bilateral renal cysts. No hydronephrosis or acute inflammatory changes seen. 4. Aortic Atherosclerosis (ICD10-I70.0). Progressive lumbar disc degeneration. 5. No evidence of abdominal mass or definite acute osseous findings. Suggested sternal fracture on the sagittal images is attributed to breathing artifact. Correlate clinically. Electronically Signed   By: Richardean Sale M.D.   On: 12/27/2017 17:36   US Renal  Result Date: 12/27/2017 CLINICAL DATA:  84 year old with personal  history of prostate cancer, presenting with urinary tract infection and renal insufficiency. Current history of hypertension and diabetes. EXAM: RENAL / URINARY TRACT ULTRASOUND COMPLETE COMPARISON:  CT abdomen and pelvis 02/25/2015. FINDINGS: Right Kidney: Length: Approximately 10.9 cm. Echogenic renal parenchyma. Approximate 1.8 x 1.9 x 1.2 cm cyst arising from the UPPER pole, with smaller cysts elsewhere in the kidney, as noted on the prior CT. No visible solid masses. No hydronephrosis. Left Kidney: Length: Approximately 12.7 cm. Echogenic renal parenchyma. Multiple cysts as noted on the prior CT, the largest arising from the mid kidney measuring approximately 4.0 x 3.4 x 4.7 cm. No visible solid masses. No hydronephrosis. Bladder: Decompressed by Foley catheter. IMPRESSION: 1. Echogenic renal parenchyma bilaterally indicating medical renal disease. 2. No evidence of hydronephrosis involving either kidney to suggest obstruction. 3. Multiple BILATERAL renal cysts as noted on a prior CT in 2016. Electronically Signed   By: Evangeline Dakin M.D.    On: 12/27/2017 12:42   Dg Chest Port 1 View  Result Date: 12/28/2017 CLINICAL DATA:  Short of breath, respiratory failure EXAM: PORTABLE CHEST 1 VIEW COMPARISON:  12/27/2017 FINDINGS: LEFT-sided pacemaker overlies normal cardiac silhouette. Bilateral small effusions. Mild interstitial edema pattern. No pneumothorax. No focal consolidation. IMPRESSION: Bilateral pleural effusions and mild interstitial edema Electronically Signed   By: Suzy Bouchard M.D.   On: 12/28/2017 08:47   Dg Chest Port 1 View  Result Date: 12/27/2017 CLINICAL DATA:  Short of breath, respiratory failure EXAM: PORTABLE CHEST 1 VIEW COMPARISON:  Portable chest x-ray of 12/25/2016 FINDINGS: There has been some improvement in opacity throughout the right lung although there may still be either mild pulmonary vascular congestion which is somewhat asymmetric or pneumonia. There may well be small pleural effusions present. Cardiomegaly is stable and pacer wires remain. Reverse right shoulder replacement is noted. IMPRESSION: 1. Some improvement in opacity throughout the right lung. This may be due to asymmetrical edema or possibly pneumonia with probable small pleural effusions. 2. Stable cardiomegaly with permanent pacemaker. Electronically Signed   By: Ivar Drape M.D.   On: 12/27/2017 08:56    Cardiac Studies   none  Patient Profile     84 y.o. male admitted with Ecoli sepsis/Tdp/atrial fib  Assessment & Plan    1. Atrial fib - his ventricular rate is controlled. Continue his current meds. No plans for rhythm control currently. 2. Urosepsis - he has improved nicely. U/S demonstrates no abscess. 3. Disp. - he will likely need SNF.  For questions or updates, please contact [place] Please consult [email or web address] for contact info under Cardiology/STEMI.      Signed, Cristopher Peru, MD  12/28/2017, 9:29 AM  Patient ID: Christopher Baker, male   DOB: 1933/07/20, 84 y.o.   MRN: [id number]

## 2017-12-29 ENCOUNTER — Ambulatory Visit: Payer: Self-pay

## 2017-12-29 DIAGNOSIS — M549 Dorsalgia, unspecified: Secondary | ICD-10-CM

## 2017-12-29 DIAGNOSIS — G9340 Encephalopathy, unspecified: Secondary | ICD-10-CM

## 2017-12-29 LAB — BASIC METABOLIC PANEL
ANION GAP: 10 (ref 5–15)
BUN: 40 mg/dL — ABNORMAL HIGH (ref 8–23)
CALCIUM: 9.2 mg/dL (ref 8.9–10.3)
CO2: 27 mmol/L (ref 22–32)
Chloride: 106 mmol/L (ref 98–111)
Creatinine, Ser: 1.62 mg/dL — ABNORMAL HIGH (ref 0.61–1.24)
GFR calc Af Amer: 44 mL/min — ABNORMAL LOW (ref >60)
GFR calc non Af Amer: 38 mL/min — ABNORMAL LOW (ref >60)
GLUCOSE: 121 mg/dL — AB (ref 70–99)
POTASSIUM: 3.5 mmol/L (ref 3.5–5.1)
Sodium: 143 mmol/L (ref 135–145)

## 2017-12-29 LAB — CULTURE, BLOOD (ROUTINE X 2)
CULTURE: NO GROWTH
Culture: NO GROWTH
Special Requests: ADEQUATE

## 2017-12-29 LAB — GLUCOSE, CAPILLARY
GLUCOSE-CAPILLARY: 114 mg/dL — AB (ref 70–99)
Glucose-Capillary: 129 mg/dL — ABNORMAL HIGH (ref 70–99)
Glucose-Capillary: 122 mg/dL — ABNORMAL HIGH (ref 70–99)
Glucose-Capillary: 137 mg/dL — ABNORMAL HIGH (ref 70–99)

## 2017-12-29 NOTE — Plan of Care (Signed)
Patient is a  readmit, he came into unit at 7pm . Assessment  completed, vital signs are stable, Noi acute distress noted

## 2017-12-29 NOTE — Progress Notes (Signed)
Physical Therapy Treatment Patient Details Name: Christopher Baker MRN: [id number] DOB: [birth year] Today's Date: 12/29/2017    History of Present Illness Christopher Baker is a 84 y.o. male with medical history significant of paroxysmal A. fib currently has a pacemaker, prior CVA, chronic systolic CHF with an EF of 25%, mild dementia, hypertension, hyperlipidemia, who is being brought to the hospital by his wife after she found him early this morning being confused and poorly responsive.     PT Comments    Patient is making progress toward PT goals. Pt able to ambulate short distance in room and transfer with min/mod A +2. Continue to progress as tolerated.    Follow Up Recommendations  CIR     Equipment Recommendations  Other (comment)(TBD next venue)    Recommendations for Other Services Speech consult     Precautions / Restrictions Precautions Precautions: Fall Restrictions Weight Bearing Restrictions: No    Mobility  Bed Mobility Overal bed mobility: Needs Assistance Bed Mobility: Supine to Sit     Supine to sit: Max assist;HOB elevated     General bed mobility comments: cues for sequencing and assist to scoot hips and elevate trunk into sitting  Transfers Overall transfer level: Needs assistance Equipment used: Rolling walker (2 wheeled) Transfers: Sit to/from [facility] Sit to Stand: +2 physical assistance;Min assist;Mod assist Stand pivot transfers: Mod assist;+2 physical assistance       General transfer comment: pt stood from EOB and recliner; cues for safe hand placement and sequencing; min A +2 to power up into standing and then mod A to gain balance and upright posture upon standing   Ambulation/Gait Ambulation/Gait assistance: Mod assist;+2 safety/equipment(chair follow) Gait Distance (Feet): 6 Feet Assistive device: Rolling walker (2 wheeled) Gait Pattern/deviations: Step-through pattern;Decreased step length - right;Decreased step  length - left;Shuffle;Trunk flexed Gait velocity: decreased   General Gait Details: multimodal cues for posture; assist for balance and guidance of RW    Stairs             Wheelchair Mobility    Modified Rankin (Stroke Patients Only)       Balance Overall balance assessment: Needs assistance Sitting-balance support: Bilateral upper extremity supported;Feet supported Sitting balance-Leahy Scale: Fair     Standing balance support: Bilateral upper extremity supported Standing balance-Leahy Scale: Poor                              Cognition Arousal/Alertness: Awake/alert Behavior During Therapy: WFL for tasks assessed/performed Overall Cognitive Status: History of cognitive impairments - at baseline                                        Exercises      General Comments        Pertinent Vitals/Pain Pain Assessment: Faces Faces Pain Scale: Hurts even more Pain Location: back Pain Descriptors / Indicators: Aching Pain Intervention(s): Limited activity within patient's tolerance;Monitored during session;Repositioned    Home Living                      Prior Function            PT Goals (current goals can now be found in the care plan section) Progress towards PT goals: Progressing toward goals    Frequency    Min 3X/week  PT Plan Current plan remains appropriate    Co-evaluation              AM-PAC PT "6 Clicks" Daily Activity  Outcome Measure  Difficulty turning over in bed (including adjusting bedclothes, sheets and blankets)?: Unable Difficulty moving from lying on back to sitting on the side of the bed? : Unable Difficulty sitting down on and standing up from a chair with arms (e.g., wheelchair, bedside commode, etc,.)?: Unable Help needed moving to and from a bed to chair (including a wheelchair)?: A Lot Help needed walking in hospital room?: A Lot Help needed climbing 3-5 steps with a  railing? : Total 6 Click Score: 8    End of Session Equipment Utilized During Treatment: Gait belt Activity Tolerance: Patient limited by pain Patient left: in chair;with call bell/phone within reach;with chair alarm set Nurse Communication: Mobility status PT Visit Diagnosis: Unsteadiness on feet (R26.81);Pain;History of falling (Z91.81) Pain - Right/Left: Right Pain - part of body: Hip     Time: 2951-8841 PT Time Calculation (min) (ACUTE ONLY): 28 min  Charges:  $Gait Training: 8-22 mins $Therapeutic Activity: 8-22 mins                    G Codes:       Christopher Baker, PTA Pager: [phone]     Christopher Baker 12/29/2017, 4:38 PM

## 2017-12-29 NOTE — Consult Note (Signed)
Physical Medicine and Rehabilitation Consult Reason for Consult: Decreased functional mobility Referring Physician: Family medicine   HPI: Christopher Baker is a 84 y.o. right-handed male with history of PAF/pacemaker maintained on Eliquis, prior CVA, chronic systolic congestive heart failure with EF of 25%, mild dementia maintained on Aricept, hypertension as well as borderline diabetes mellitus.  Per chart review patient lives with wife.  Used a rolling walker prior to admission.  Reports patient had been essentially bedbound for the past week he was able to stand and complete a stand pivot transfer.  Presented 12/23/2017 with altered mental status.  In the ED noted fever as well as heart rate in the 150s.  Placed on broad-spectrum antibiotics for suspected urosepsis.  Chest x-ray showed some vascular congestion probable mild pulmonary edema, troponin 1.55, urine culture 100,000 E. coli, lactic acid 2.6, creatinine 1.37.  Patient developed V. tach arrest later that evening follow-up per cardiology services.  Patient did receive CPR.  Echocardiogram 12/25/2017 ejection fraction of 30% diffuse hypokinesis.  Renal ultrasound with no hydronephrosis.  CT of the abdomen with no acute findings.  Therapy evaluations completed with recommendations of physical medicine rehab consult.   Review of Systems  Constitutional: Positive for fever.  HENT: Negative for hearing loss.   Eyes: Negative for blurred vision and double vision.  Respiratory: Positive for shortness of breath.   Cardiovascular: Positive for leg swelling.  Gastrointestinal: Positive for constipation. Negative for nausea and vomiting.  Genitourinary: Negative for dysuria, flank pain and hematuria.  Musculoskeletal: Positive for joint pain and myalgias.  Skin: Negative for rash.  Neurological: Negative for seizures.  Psychiatric/Behavioral: Positive for memory loss.       Anxiety  All other systems reviewed and are negative.  Past  Medical History:  Diagnosis Date  . Anxiety   . Arthritis    "hands, right hip; lower back" (02/28/2017)  . Arthritis with psoriasis ([place])   . Borderline diabetes    DIET CONTROLLED - PT STATES HE IS NOT DIABETIC  . Carotid stenosis, bilateral PER DR EARLY / DUPLEX  09-09-10     40 - 59%   BILATERALLY--- ASYMPTOMATIC  . Chronic lower back pain   . Depression   . Dysrhythmia    a-fib  . GERD (gastroesophageal reflux disease)    CONTROLLED W/ PROTONIX  . History of gout   . HTN (hypertension)   . Hyperlipidemia   . Iron deficiency   . Lumbar spondylosis W/ RADICULOPATHY  . OSA on CPAP   . PAF (paroxysmal atrial fibrillation) ([place])   . Presence of permanent cardiac pacemaker DDD-  06-04-10-   DDD; SECONDARY TO SYNCOPY AND BRADYCARDIA  . Prostate cancer ([place]) 2008   S/P 93 RADIATION  [place]    . Restless leg syndrome   . Stroke [facility]) Oct. 14, 2014   light left hand, balance issues, short term memory loss 2014  . SVT (supraventricular tachycardia) ([place])    S/P ABLATION   2008   Past Surgical History:  Procedure Laterality Date  . BACK SURGERY    . BIV UPGRADE N/A 09/07/2017   Procedure: BIV PACEMAKER UPGRADE;  Surgeon: Evans Lance, MD;  Location: [facility];  Service: Cardiovascular;  Laterality: N/A;  . BREAST SURGERY     "removed tumor; don't remember which side; years ago" (02/28/2017)  . CARDIAC ELECTROPHYSIOLOGY STUDY AND ABLATION  2008   FOR SVT  . CARDIAC PACEMAKER PLACEMENT  06-04-10   DDD  .  CARDIOVERSION N/A 03/02/2017   Procedure: CARDIOVERSION;  Surgeon: Josue Hector, MD;  Location: [facility] ENDOSCOPY;  Service: Cardiovascular;  Laterality: N/A;  . CATARACT EXTRACTION W/ INTRAOCULAR LENS  IMPLANT, BILATERAL Bilateral   . CHOLECYSTECTOMY  2009  . ESOPHAGOGASTRODUODENOSCOPY N/A 12/12/2014   Procedure: ESOPHAGOGASTRODUODENOSCOPY (EGD);  Surgeon: Wilford Corner, MD;  Location: [facility] ENDOSCOPY;  Service: Endoscopy;  Laterality: N/A;  . INCISION AND DRAINAGE Right  1997   "knee replacement got infected"  . INGUINAL HERNIA REPAIR Left 06/2003    DONE WITH PENILE PROSTESIS SURG.  . INGUINAL HERNIA REPAIR Right 1962  . JOINT REPLACEMENT    . KNEE ARTHROSCOPY  06/02/2011   Procedure: ARTHROSCOPY KNEE;  Surgeon: Gearlean Alf;  Location: [place];  Service: Orthopedics;  Laterality: Left;  LEFT KNEE ARTHROSCOPY WITH DEBRIDEMENT  . LOOP RECORDER PLACEMENT  01-30-10  . LOOP RECORDER REMOVAL  06/04/2010  . LUMBAR LAMINECTOMY/ DISKECTOMY/ FUSION  05-19-10   L4 - 5  . PENILE PROSTHESIS IMPLANT  06/2003   AND LEFT CORPOROPLASTY /    DONE INGUINAL REPAIR  . PROSTATE BIOPSY  2007  . REVISION TOTAL KNEE ARTHROPLASTY Right 1997  . TOTAL KNEE ARTHROPLASTY  12/20/2011   Procedure: TOTAL KNEE ARTHROPLASTY;  Surgeon: Gearlean Alf, MD;  Location: [facility];  Service: Orthopedics;  Laterality: Left;  . TOTAL KNEE ARTHROPLASTY Right 1997  . TOTAL SHOULDER ARTHROPLASTY Right 06/27/2015   Procedure: REVERSE TOTAL SHOULDER ARTHROPLASTY;  Surgeon: Netta Cedars, MD;  Location: [place];  Service: Orthopedics;  Laterality: Right;  . TRANSURETHRAL RESECTION OF BLADDER TUMOR WITH GYRUS (TURBT-GYRUS)  ?2018  . TRANSURETHRAL RESECTION OF PROSTATE  2006   Family History  Problem Relation Age of Onset  . Stroke Mother   . Early death Father    Social History:  reports that he quit smoking about 19 years ago. His smoking use included cigarettes. He has a 45.00 pack-year smoking history. He has never used smokeless tobacco. He reports that he drinks alcohol. He reports that he does not use drugs. Allergies:  Allergies  Allergen Reactions  . Ciprofloxacin Nausea Only and Other (See Comments)    Stomach ache  . Hydrocodone-Acetaminophen Other (See Comments)    Hallucinations in higher doses  . Other Other (See Comments)    Rash from neoprene wrap after knee surgery  . Shellfish Allergy Nausea And Vomiting and Other (See Comments)    Reaction to scallops  .  Tramadol Other (See Comments)    Pt becomes combative per wife   Medications Prior to Admission  Medication Sig Dispense Refill  . acetaminophen (TYLENOL) 500 MG tablet Take 1,000 mg by mouth 2 (two) times daily.    Marland Kitchen amoxicillin (AMOXIL) 500 MG capsule Take 2,000 mg by mouth See admin instructions. Take 2000 mg by mouth one hour prior to dental apointments    . Artificial Tear Ointment (LUBRICANT EYE OP) Place 2 drops into both eyes daily as needed (for dry eyes).    Marland Kitchen aspirin 325 MG tablet Take 325 mg by mouth once.    Marland Kitchen atorvastatin (LIPITOR) 20 MG tablet Take 20 mg by mouth daily.    . cyanocobalamin (,VITAMIN B-12,) 1000 MCG/ML injection Inject 1,000 mcg into the muscle every 30 (thirty) days.     Marland Kitchen dofetilide (TIKOSYN) 500 MCG capsule Take 1 capsule (500 mcg total) by mouth 2 (two) times daily. (Patient taking differently: Take 500 mcg by mouth every 12 (twelve) hours. Takes between 10:00 and 11:00 in  the AM & PM) 180 capsule 3  . donepezil (ARICEPT) 10 MG tablet TAKE ONE TABLET BY MOUTH EVERY NIGHT AT BEDTIME 90 tablet 1  . DULoxetine (CYMBALTA) 30 MG capsule Take 1 capsule (30 mg total) by mouth daily. (Patient taking differently: Take 30 mg by mouth at bedtime. ) 30 capsule 0  . ELIQUIS 5 MG TABS tablet TAKE 1 TABLET (5 MG TOTAL) BY MOUTH 2 (TWO) TIMES DAILY. 60 tablet 8  . ezetimibe (ZETIA) 10 MG tablet Take 1 tablet (10 mg total) by mouth daily. 90 tablet 1  . ferrous sulfate 325 (65 FE) MG tablet Take 325 mg by mouth 2 (two) times daily with a meal.     . folic acid (FOLVITE) 1 MG tablet Take 1 mg by mouth at bedtime.     . furosemide (LASIX) 40 MG tablet Take 80 mg by mouth daily.     Marland Kitchen lisinopril (PRINIVIL,ZESTRIL) 5 MG tablet TAKE ONE TABLET BY MOUTH DAILY (Patient taking differently: TAKE ONE TABLET BY MOUTH in the evening) 90 tablet 2  . loperamide (IMODIUM A-D) 2 MG tablet Take 2 mg by mouth 3 (three) times daily as needed for diarrhea or loose stools.     . methotrexate  (RHEUMATREX) 2.5 MG tablet Take 15 mg by mouth every Monday.     . metoprolol succinate (TOPROL XL) 25 MG 24 hr tablet Take 1 tablet (25 mg total) by mouth daily. (Patient taking differently: Take 25 mg by mouth at bedtime. ) 90 tablet 2  . oxyCODONE-acetaminophen (PERCOCET) 10-325 MG tablet Take 0.25-0.5 tablets by mouth 2 (two) times daily as needed for pain.     . pantoprazole (PROTONIX) 40 MG tablet Take 1 tablet (40 mg total) by mouth daily. (Patient taking differently: Take 40 mg by mouth at bedtime. ) 90 tablet 3  . predniSONE (DELTASONE) 5 MG tablet Take 5 mg by mouth daily.    . tamsulosin (FLOMAX) 0.4 MG CAPS capsule Take 0.4 mg by mouth daily.    . Vitamin D, Ergocalciferol, (DRISDOL) 50000 units CAPS capsule Take 50,000 Units by mouth every Sunday.     . DULoxetine (CYMBALTA) 30 MG capsule Take 1 capsule (30 mg total) by mouth daily. (Patient not taking: Reported on 12/23/2017) 30 capsule 5    Home: Home Living Family/patient expects to be discharged to:: Private residence Living Arrangements: Spouse/significant other Available Help at Discharge: Family, Available 24 hours/day Type of Home: House Home Access: Stairs to enter [employer] of Steps: 3 Entrance Stairs-Rails: Right, Left Home Layout: Able to live on main level with bedroom/bathroom Bathroom Shower/Tub: Multimedia programmer: Standard Bathroom Accessibility: Yes Home Equipment: Environmental consultant - 4 wheels  Functional History: Prior Function Level of Independence: Independent with assistive device(s) Comments: amb with rollator . Performed ADLs independently Functional Status:  Mobility: Bed Mobility Overal bed mobility: Needs Assistance Bed Mobility: Supine to Sit Supine to sit: Max assist, HOB elevated General bed mobility comments: pt with R flank pain, initiated LE movement to EOB, due to chest/flank pain, maxA for trunk elevation required Transfers Overall transfer level: Needs  assistance Equipment used: 2 person hand held assist Transfers: Sit to/from Stand, Stand Pivot Transfers Sit to Stand: Mod assist, +2 physical assistance Stand pivot transfers: Mod assist, +2 physical assistance Squat pivot transfers: Mod assist, +2 physical assistance General transfer comment: pt able to push up to erect posture, modA to maintain balance, pt weak in bilat knees with buckling, modAx2 to sequence stepping to the L,  pt with decreased R LE WBing due to R hip pain. modA for walker management Ambulation/Gait General Gait Details: took 5 steps to chair during std pvt  with RW    ADL: ADL Overall ADL's : Needs assistance/impaired Eating/Feeding: Set up, Supervision/ safety, Sitting Grooming: Wash/dry face, Set up, Supervision/safety, Sitting Grooming Details (indicate cue type and reason): Washing face awhile seated in recliner. Requiring cues for initating and increase occupational participation Upper Body Bathing: Moderate assistance, Sitting Lower Body Bathing: Maximal assistance, Sit to/from stand Upper Body Dressing : Moderate assistance, Sitting Lower Body Dressing: Maximal assistance, Sit to/from stand Lower Body Dressing Details (indicate cue type and reason): Max A to don socks at [employer]: Moderate assistance, Stand-pivot(two person; simulated to recliner) Toilet Transfer Details (indicate cue type and reason): Mod A +2 for transition to left Toileting- Clothing Manipulation and Hygiene: Maximal assistance, +2 for safety/equipment, Bed level Toileting - Clothing Manipulation Details (indicate cue type and reason): Mod A for rolling and then Max A for toilet hygiene with BM in bed Functional mobility during ADLs: Moderate assistance, +2 for physical assistance, +2 for safety/equipment(stand pivot only) General ADL Comments: Pt with decreased balance, strength, and cognition  Cognition: Cognition Overall Cognitive Status: Within Functional Limits for tasks  assessed Orientation Level: Oriented to person, Oriented to place, Disoriented to time, Appropriate for developmental age, Oriented to situation Cognition Arousal/Alertness: Lethargic(pt had oxycodone prior to treatment) Behavior During Therapy: Flat affect Overall Cognitive Status: Within Functional Limits for tasks assessed General Comments: history of dementia and memory deficits. Following simple cues  Blood pressure 131/69, pulse (!) 55, temperature (!) 97.4 F (36.3 C), temperature source Oral, resp. rate 17, height 6\' 3"  (1.905 m), weight 94.6 kg (208 lb 8.9 oz), SpO2 (!) 87 %. Physical Exam  Vitals reviewed. HENT:  Head: Normocephalic.  Eyes: EOM are normal.  Neck: Normal range of motion. Neck supple. No thyromegaly present.  Cardiovascular:  Cardiac rate controlled  GI: Soft. Bowel sounds are normal. He exhibits no distension.  Neurological: He is alert.  Provides name, age.  Needed subtle cues for date of birth. Told me he lives in [place]. Language intact, decreased insight and awareness.  Limited medical historian. UE 4/5 prox to distal. LE: 3- to 3/5 HF, 3/5 KE and 4/5 ADF/PF. Senses pain in all 4's.  Skin: Skin is warm and dry.  Psychiatric:  Pleasant, cooperative    Results for orders placed or performed during the hospital encounter of 12/23/17 (from the past 24 hour(s))  Glucose, capillary     Status: Abnormal   Collection Time: 12/28/17 11:44 AM  Result Value Ref Range   Glucose-Capillary 116 (H) 70 - 99 mg/dL   Comment 1 Capillary Specimen   Glucose, capillary     Status: Abnormal   Collection Time: 12/28/17  4:09 PM  Result Value Ref Range   Glucose-Capillary 106 (H) 70 - 99 mg/dL   Comment 1 Capillary Specimen    Ct Abdomen Wo Contrast  Result Date: 12/27/2017 CLINICAL DATA:  Nonpulsatile abdominal mass. Recent fall. Improving pulmonary opacities with diuresis. EXAM: CT CHEST AND ABDOMEN WITHOUT CONTRAST TECHNIQUE: Multidetector CT imaging of the chest and  abdomen was performed following the standard protocol without intravenous contrast. COMPARISON:  Recent chest radiographs. Pelvic CT 03/08/2016, chest CT 06/25/2015 and abdominopelvic CT 02/25/2015. FINDINGS: CT CHEST FINDINGS WITHOUT CONTRAST Cardiovascular: Diffuse atherosclerosis of the aorta, great vessels and coronary arteries. No acute vascular findings are seen on noncontrast imaging. The heart is mildly  enlarged. Patient has a left subclavian pacemaker with leads in stable position. Probable aortic valvular calcifications. No significant pericardial effusion. Mediastinum/Nodes: There are no enlarged mediastinal, hilar or axillary lymph nodes. Small mediastinal lymph nodes are stable. The esophagus and thyroid gland appear unremarkable. Lungs/Pleura: Small dependent bilateral pleural effusions with associated bibasilar atelectasis. There is asymmetric interstitial prominence throughout the right lung as seen on recent radiographs. There is underlying moderate centrilobular emphysema. No focal nodule or endobronchial lesion seen. Musculoskeletal/Chest wall: No chest wall mass or suspicious osseous findings. A suggested midsternal fracture on sagittal image 110/7 is attributed to breathing artifact based on the axial images and is not associated with any surrounding soft tissue swelling. Right shoulder reverse arthroplasty and muscular atrophy noted. CT ABDOMEN FINDINGS Hepatobiliary: Multiple hepatic cysts appear stable. There is no significant biliary dilatation status post cholecystectomy. Pancreas: Unremarkable. No pancreatic ductal dilatation or surrounding inflammatory changes. Spleen: Normal in size without focal abnormality. Adrenals/Urinary Tract: Both adrenal glands appear normal. Stable nonobstructing 5 mm calculus in the lower pole of the right kidney (image 82/3). No evidence of hydronephrosis, proximal ureteral calculus or asymmetric perinephric soft tissue stranding. Numerous bilateral renal  cysts are grossly unchanged in size and appearance. Stomach/Bowel: No evidence of bowel wall thickening, distention or surrounding inflammatory change. Vascular/Lymphatic: There are no enlarged abdominal lymph nodes. There are stable small lymph nodes in the porta hepatis. There is diffuse aortic and branch vessel atherosclerosis. Other: The visualized anterior abdominal wall is intact. No ascites. No abdominal wall mass or hernia seen. Musculoskeletal: No acute or significant osseous findings. Multilevel spondylosis status post L4-5 fusion. Disc degeneration appears progressive at L2-3 and L3-4. IMPRESSION: 1. In correlation with recent chest radiographs, asymmetric interstitial opacities in the right lung, bilateral pleural effusions and bibasilar atelectasis probably reflect resolving pulmonary edema superimposed on emphysema. Continued chest radiographic follow up recommended. Emphysema (ICD10-J43.9). 2. No acute findings seen within the abdomen. 3. Stable nonobstructing right renal calculus and bilateral renal cysts. No hydronephrosis or acute inflammatory changes seen. 4. Aortic Atherosclerosis (ICD10-I70.0). Progressive lumbar disc degeneration. 5. No evidence of abdominal mass or definite acute osseous findings. Suggested sternal fracture on the sagittal images is attributed to breathing artifact. Correlate clinically. Electronically Signed   By: Richardean Sale M.D.   On: 12/27/2017 17:36   Ct Chest Wo Contrast  Result Date: 12/27/2017 CLINICAL DATA:  Nonpulsatile abdominal mass. Recent fall. Improving pulmonary opacities with diuresis. EXAM: CT CHEST AND ABDOMEN WITHOUT CONTRAST TECHNIQUE: Multidetector CT imaging of the chest and abdomen was performed following the standard protocol without intravenous contrast. COMPARISON:  Recent chest radiographs. Pelvic CT 03/08/2016, chest CT 06/25/2015 and abdominopelvic CT 02/25/2015. FINDINGS: CT CHEST FINDINGS WITHOUT CONTRAST Cardiovascular: Diffuse  atherosclerosis of the aorta, great vessels and coronary arteries. No acute vascular findings are seen on noncontrast imaging. The heart is mildly enlarged. Patient has a left subclavian pacemaker with leads in stable position. Probable aortic valvular calcifications. No significant pericardial effusion. Mediastinum/Nodes: There are no enlarged mediastinal, hilar or axillary lymph nodes. Small mediastinal lymph nodes are stable. The esophagus and thyroid gland appear unremarkable. Lungs/Pleura: Small dependent bilateral pleural effusions with associated bibasilar atelectasis. There is asymmetric interstitial prominence throughout the right lung as seen on recent radiographs. There is underlying moderate centrilobular emphysema. No focal nodule or endobronchial lesion seen. Musculoskeletal/Chest wall: No chest wall mass or suspicious osseous findings. A suggested midsternal fracture on sagittal image 110/7 is attributed to breathing artifact based on the axial images and is not  associated with any surrounding soft tissue swelling. Right shoulder reverse arthroplasty and muscular atrophy noted. CT ABDOMEN FINDINGS Hepatobiliary: Multiple hepatic cysts appear stable. There is no significant biliary dilatation status post cholecystectomy. Pancreas: Unremarkable. No pancreatic ductal dilatation or surrounding inflammatory changes. Spleen: Normal in size without focal abnormality. Adrenals/Urinary Tract: Both adrenal glands appear normal. Stable nonobstructing 5 mm calculus in the lower pole of the right kidney (image 82/3). No evidence of hydronephrosis, proximal ureteral calculus or asymmetric perinephric soft tissue stranding. Numerous bilateral renal cysts are grossly unchanged in size and appearance. Stomach/Bowel: No evidence of bowel wall thickening, distention or surrounding inflammatory change. Vascular/Lymphatic: There are no enlarged abdominal lymph nodes. There are stable small lymph nodes in the porta  hepatis. There is diffuse aortic and branch vessel atherosclerosis. Other: The visualized anterior abdominal wall is intact. No ascites. No abdominal wall mass or hernia seen. Musculoskeletal: No acute or significant osseous findings. Multilevel spondylosis status post L4-5 fusion. Disc degeneration appears progressive at L2-3 and L3-4. IMPRESSION: 1. In correlation with recent chest radiographs, asymmetric interstitial opacities in the right lung, bilateral pleural effusions and bibasilar atelectasis probably reflect resolving pulmonary edema superimposed on emphysema. Continued chest radiographic follow up recommended. Emphysema (ICD10-J43.9). 2. No acute findings seen within the abdomen. 3. Stable nonobstructing right renal calculus and bilateral renal cysts. No hydronephrosis or acute inflammatory changes seen. 4. Aortic Atherosclerosis (ICD10-I70.0). Progressive lumbar disc degeneration. 5. No evidence of abdominal mass or definite acute osseous findings. Suggested sternal fracture on the sagittal images is attributed to breathing artifact. Correlate clinically. Electronically Signed   By: Richardean Sale M.D.   On: 12/27/2017 17:36   US Renal  Result Date: 12/27/2017 CLINICAL DATA:  84 year old with personal history of prostate cancer, presenting with urinary tract infection and renal insufficiency. Current history of hypertension and diabetes. EXAM: RENAL / URINARY TRACT ULTRASOUND COMPLETE COMPARISON:  CT abdomen and pelvis 02/25/2015. FINDINGS: Right Kidney: Length: Approximately 10.9 cm. Echogenic renal parenchyma. Approximate 1.8 x 1.9 x 1.2 cm cyst arising from the UPPER pole, with smaller cysts elsewhere in the kidney, as noted on the prior CT. No visible solid masses. No hydronephrosis. Left Kidney: Length: Approximately 12.7 cm. Echogenic renal parenchyma. Multiple cysts as noted on the prior CT, the largest arising from the mid kidney measuring approximately 4.0 x 3.4 x 4.7 cm. No visible solid  masses. No hydronephrosis. Bladder: Decompressed by Foley catheter. IMPRESSION: 1. Echogenic renal parenchyma bilaterally indicating medical renal disease. 2. No evidence of hydronephrosis involving either kidney to suggest obstruction. 3. Multiple BILATERAL renal cysts as noted on a prior CT in 2016. Electronically Signed   By: Evangeline Dakin M.D.   On: 12/27/2017 12:42   Dg Chest Port 1 View  Result Date: 12/28/2017 CLINICAL DATA:  Short of breath, respiratory failure EXAM: PORTABLE CHEST 1 VIEW COMPARISON:  12/27/2017 FINDINGS: LEFT-sided pacemaker overlies normal cardiac silhouette. Bilateral small effusions. Mild interstitial edema pattern. No pneumothorax. No focal consolidation. IMPRESSION: Bilateral pleural effusions and mild interstitial edema Electronically Signed   By: Suzy Bouchard M.D.   On: 12/28/2017 08:47     Assessment/Plan: Diagnosis: encephalopathy due to urosepsis/multiple medical, hx of prior right CVA 1. Does the need for close, 24 hr/day medical supervision in concert with the patient's rehab needs make it unreasonable for this patient to be served in a less intensive setting? Yes 2. Co-Morbidities requiring supervision/potential complications: radiation cystitis with chronic incontinence,  Afib,  3. Due to bladder management, bowel management, safety, skin/wound  care, disease management, medication administration, pain management and patient education, does the patient require 24 hr/day rehab nursing? Yes 4. Does the patient require coordinated care of a physician, rehab nurse, PT (1-2 hrs/day, 5 days/week), OT (1-2 hrs/day, 5 days/week) and SLP (1-2 hrs/day, 5 days/week) to address physical and functional deficits in the context of the above medical diagnosis(es)? Yes Addressing deficits in the following areas: balance, endurance, locomotion, strength, transferring, bowel/bladder control, bathing, dressing, feeding, grooming, toileting, cognition and psychosocial  support 5. Can the patient actively participate in an intensive therapy program of at least 3 hrs of therapy per day at least 5 days per week? Yes 6. The potential for patient to make measurable gains while on inpatient rehab is excellent 7. Anticipated functional outcomes upon discharge from inpatient rehab are modified independent and supervision  with PT, modified independent and supervision with OT, supervision with SLP. 8. Estimated rehab length of stay to reach the above functional goals is: 13-18 days 9. Anticipated D/C setting: Home 10. Anticipated post D/C treatments: [place] therapy 11. Overall Rehab/Functional Prognosis: excellent  RECOMMENDATIONS: This patient's condition is appropriate for continued rehabilitative care in the following setting: CIR Patient has agreed to participate in recommended program. Yes Note that insurance prior authorization may be required for reimbursement for recommended care.  Comment: Pt was active/mobile prior to his recent medical decline. Wife is very supportive. Rehab Admissions Coordinator to follow up.  Thanks,  [name], MD, Mellody Drown  I have personally performed a face to face diagnostic evaluation of this patient. Additionally, I have reviewed and concur with the physician assistant's documentation above.  Lavon Paganini Angiulli, PA-C 12/29/2017

## 2017-12-29 NOTE — Progress Notes (Signed)
[facility]  Date of Admission:  12/23/2017   Total days of antibiotics: 7          ASSESSMENT: Christopher Baker has remained afebrile over the past 4 days and blood cultures have returned without growth for the past 5 days. Today is the 7th day and final day of treatment for E.coli bacteremia. At this time, we will sign off but we are available for any questions.   PLAN: 1. Discontinue Ceftriaxone after last dose today is given.   Principal Problem:   E coli bacteremia Active Problems:   Sepsis ([place])   Ventricular tachycardia (HCC)   Pressure injury of skin   Urinary tract infection   Acute pulmonary edema (HCC)   Drug-induced torsades de pointes   Atrial fibrillation with rapid ventricular response (HCC)   Acute respiratory failure with hypoxemia (HCC)   Aspiration pneumonia of right upper lobe due to gastric secretions (HCC)   Scheduled Meds: . acetaminophen  650 mg Oral Q6H  . apixaban  2.5 mg Oral BID  . atorvastatin  20 mg Oral Daily  . donepezil  10 mg Oral QHS  . DULoxetine  30 mg Oral QHS  . feeding supplement (PRO-STAT SUGAR FREE 64)  30 mL Oral TID  . ferrous sulfate  325 mg Oral BID WC  . folic acid  1 mg Oral QHS  . furosemide  40 mg Intravenous Daily  . lidocaine  1 patch Transdermal Q24H  . metoprolol succinate  25 mg Oral QHS  . pantoprazole  40 mg Oral QHS   Continuous Infusions: . sodium chloride 20 mL (12/28/17 1658)  . cefTRIAXone (ROCEPHIN)  IV 2 g (12/28/17 1659)   PRN Meds:.sodium chloride, Gerhardt's butt cream, oxyCODONE-acetaminophen   SUBJECTIVE: Christopher Baker states he is feeling okay today. He is really bothered by his back pain that has been exacerbated since admission. However, he denies fever, chills, nausea, vomiting, diarrhea, abdominal pain, SOB, cough and chest pain. From an infection point of view, he feels much better.    Review of Systems: Review of Systems  Constitutional: Negative for chills  and fever.  Respiratory: Negative for cough, shortness of breath and wheezing.   Cardiovascular: Negative for chest pain.  Gastrointestinal: Negative for abdominal pain, diarrhea, nausea and vomiting.  Musculoskeletal: Positive for back pain.    Allergies  Allergen Reactions  . Ciprofloxacin Nausea Only and Other (See Comments)    Stomach ache  . Hydrocodone-Acetaminophen Other (See Comments)    Hallucinations in higher doses  . Other Other (See Comments)    Rash from neoprene wrap after knee surgery  . Shellfish Allergy Nausea And Vomiting and Other (See Comments)    Reaction to scallops  . Tramadol Other (See Comments)    Pt becomes combative per wife    OBJECTIVE: Vitals:   12/29/17 0748 12/29/17 0849 12/29/17 1042 12/29/17 1200  BP:   (!) 153/53   Pulse:   (!) 111   Resp:   19   Temp: 98.2 F (36.8 C)   98 F (36.7 C)  TempSrc: Oral   Oral  SpO2:  94% 95%   Weight:      Height:       Body mass index is 26.07 kg/m.  Physical Exam  Constitutional: He appears well-developed and well-nourished. No distress.  Cardiovascular: Normal rate and regular rhythm.  Pulmonary/Chest: Effort normal and breath sounds normal. No respiratory distress. He has no  wheezes. He has no rales.  Abdominal: Soft. Bowel sounds are normal. There is no tenderness. There is no guarding.  Neurological: He is alert.  Significantly improved awareness of surrounds and level of alertness.   Skin: Skin is warm and dry. He is not diaphoretic.    Lab Results Lab Results  Component Value Date   WBC 14.4 (H) 12/28/2017   HGB 10.1 (L) 12/28/2017   HCT 32.8 (L) 12/28/2017   MCV 89.4 12/28/2017   PLT 163 12/28/2017    Lab Results  Component Value Date   CREATININE 1.62 (H) 12/29/2017   BUN 40 (H) 12/29/2017   NA 143 12/29/2017   K 3.5 12/29/2017   CL 106 12/29/2017   CO2 27 12/29/2017    Lab Results  Component Value Date   ALT 125 (H) 12/24/2017   AST 145 (H) 12/24/2017   ALKPHOS 90  12/24/2017   BILITOT 0.8 12/24/2017     Microbiology: Recent Results (from the past 240 hour(s))  Blood Culture (routine x 2)     Status: Abnormal   Collection Time: 12/23/17  8:13 AM  Result Value Ref Range Status   Specimen Description BLOOD RIGHT ANTECUBITAL  Final   Special Requests   Final    BOTTLES DRAWN AEROBIC AND ANAEROBIC Blood Culture adequate volume   Culture  Setup Time   Final    GRAM NEGATIVE RODS IN BOTH AEROBIC AND ANAEROBIC BOTTLES CRITICAL RESULT CALLED TO, READ BACK BY AND VERIFIED WITHFerne Coe [facility] 2126 12/23/17 A BROWNING Performed at [facility], [place] [address] [place], [place] [address]    Culture ESCHERICHIA COLI (A)  Final   Report Status 12/25/2017 FINAL  Final   Organism ID, Bacteria ESCHERICHIA COLI  Final      Susceptibility   Escherichia coli - MIC*    AMPICILLIN >=32 RESISTANT Resistant     CEFAZOLIN <=4 SENSITIVE Sensitive     CEFEPIME <=1 SENSITIVE Sensitive     CEFTAZIDIME <=1 SENSITIVE Sensitive     CEFTRIAXONE <=1 SENSITIVE Sensitive     CIPROFLOXACIN <=0.25 SENSITIVE Sensitive     GENTAMICIN <=1 SENSITIVE Sensitive     IMIPENEM <=0.25 SENSITIVE Sensitive     TRIMETH/SULFA <=20 SENSITIVE Sensitive     AMPICILLIN/SULBACTAM >=32 RESISTANT Resistant     PIP/TAZO <=4 SENSITIVE Sensitive     Extended ESBL NEGATIVE Sensitive     * ESCHERICHIA COLI  Blood Culture ID Panel (Reflexed)     Status: Abnormal   Collection Time: 12/23/17  8:13 AM  Result Value Ref Range Status   Enterococcus species NOT DETECTED NOT DETECTED Final   Listeria monocytogenes NOT DETECTED NOT DETECTED Final   Staphylococcus species NOT DETECTED NOT DETECTED Final   Staphylococcus aureus NOT DETECTED NOT DETECTED Final   Streptococcus species NOT DETECTED NOT DETECTED Final   Streptococcus agalactiae NOT DETECTED NOT DETECTED Final   Streptococcus pneumoniae NOT DETECTED NOT DETECTED Final   Streptococcus pyogenes NOT DETECTED NOT DETECTED Final    Acinetobacter baumannii NOT DETECTED NOT DETECTED Final   Enterobacteriaceae species DETECTED (A) NOT DETECTED Final    Comment: Enterobacteriaceae represent a large family of gram-negative bacteria, not a single organism. CRITICAL RESULT CALLED TO, READ BACK BY AND VERIFIED WITH: E MARTIN PHARMD 2126 12/23/17 A BROWNING    Enterobacter cloacae complex NOT DETECTED NOT DETECTED Final   Escherichia coli DETECTED (A) NOT DETECTED Final    Comment: CRITICAL RESULT CALLED TO, READ BACK BY AND VERIFIED WITH:  Ferne Coe PHARMD 2126 12/23/17 A BROWNING    Klebsiella oxytoca NOT DETECTED NOT DETECTED Final   Klebsiella pneumoniae NOT DETECTED NOT DETECTED Final   Proteus species NOT DETECTED NOT DETECTED Final   Serratia marcescens NOT DETECTED NOT DETECTED Final   Carbapenem resistance NOT DETECTED NOT DETECTED Final   Haemophilus influenzae NOT DETECTED NOT DETECTED Final   Neisseria meningitidis NOT DETECTED NOT DETECTED Final   Pseudomonas aeruginosa NOT DETECTED NOT DETECTED Final   Candida albicans NOT DETECTED NOT DETECTED Final   Candida glabrata NOT DETECTED NOT DETECTED Final   Candida krusei NOT DETECTED NOT DETECTED Final   Candida parapsilosis NOT DETECTED NOT DETECTED Final   Candida tropicalis NOT DETECTED NOT DETECTED Final    Comment: Performed at [facility], [place] [address], [place], [place] [address]  Blood Culture (routine x 2)     Status: Abnormal   Collection Time: 12/23/17  8:30 AM  Result Value Ref Range Status   Specimen Description BLOOD LEFT HAND  Final   Special Requests   Final    BOTTLES DRAWN AEROBIC AND ANAEROBIC Blood Culture results may not be optimal due to an inadequate volume of blood received in culture bottles   Culture  Setup Time   Final    GRAM NEGATIVE RODS IN BOTH AEROBIC AND ANAEROBIC BOTTLES CRITICAL VALUE NOTED.  VALUE IS CONSISTENT WITH PREVIOUSLY REPORTED AND CALLED VALUE.    Culture (A)  Final    ESCHERICHIA COLI SUSCEPTIBILITIES  PERFORMED ON PREVIOUS CULTURE WITHIN THE LAST 5 DAYS. Performed at [facility], [place] [address] [place], [place] [address]    Report Status 12/25/2017 FINAL  Final  Urine culture     Status: Abnormal   Collection Time: 12/23/17  8:44 AM  Result Value Ref Range Status   Specimen Description URINE, CATHETERIZED  Final   Special Requests   Final    NONE Performed at [facility], [place] [address] [place], [place] [address]    Culture >=100,000 COLONIES/mL ESCHERICHIA COLI (A)  Final   Report Status 12/25/2017 FINAL  Final   Organism ID, Bacteria ESCHERICHIA COLI (A)  Final      Susceptibility   Escherichia coli - MIC*    AMPICILLIN >=32 RESISTANT Resistant     CEFAZOLIN 16 SENSITIVE Sensitive     CEFTRIAXONE <=1 SENSITIVE Sensitive     CIPROFLOXACIN <=0.25 SENSITIVE Sensitive     GENTAMICIN <=1 SENSITIVE Sensitive     IMIPENEM <=0.25 SENSITIVE Sensitive     NITROFURANTOIN <=16 SENSITIVE Sensitive     TRIMETH/SULFA <=20 SENSITIVE Sensitive     AMPICILLIN/SULBACTAM >=32 RESISTANT Resistant     PIP/TAZO <=4 SENSITIVE Sensitive     Extended ESBL NEGATIVE Sensitive     * >=100,000 COLONIES/mL ESCHERICHIA COLI  MRSA PCR Screening     Status: None   Collection Time: 12/23/17  9:06 PM  Result Value Ref Range Status   MRSA by PCR NEGATIVE NEGATIVE Final    Comment:        The GeneXpert MRSA Assay (FDA approved for NASAL specimens only), is one component of a comprehensive MRSA colonization surveillance program. It is not intended to diagnose MRSA infection nor to guide or monitor treatment for MRSA infections. Performed at [facility], [place] [address] [place],  [address]   Culture, blood (Routine X 2) w Reflex to ID Panel     Status: None   Collection Time: 12/24/17 12:15 PM  Result Value Ref Range Status   Specimen Description BLOOD BLOOD LEFT HAND  Final   Special Requests   Final    BOTTLES DRAWN AEROBIC ONLY Blood Culture results may not be optimal  due to an inadequate volume of blood received in culture bottles   Culture   Final    NO GROWTH 5 DAYS Performed at [place] [address] [place], [place] [address]    Report Status 12/29/2017 FINAL  Final  Culture, blood (Routine X 2) w Reflex to ID Panel     Status: None   Collection Time: 12/24/17 12:15 PM  Result Value Ref Range Status   Specimen Description BLOOD BLOOD RIGHT HAND  Final   Special Requests   Final    BOTTLES DRAWN AEROBIC AND ANAEROBIC Blood Culture adequate volume   Culture   Final    NO GROWTH 5 DAYS Performed at [facility], [place] [address], [place], [place] [address]    Report Status 12/29/2017 FINAL  Final    Jose Persia, MS4 Dr. Michel Bickers, [place] [facility] [place] [phone] pager   [phone] cell 12/29/2017, 3:06 PM

## 2017-12-29 NOTE — Progress Notes (Signed)
PROGRESS NOTE  Christopher Baker JHE:[id number] DOB: 02/26/1934 DOA: 12/23/2017 PCP: Haywood Pao, MD  HPI/Recap of past 24 hours:  Patient appear weak, reports sternum pain when taking deep breath Reports chronic right sided back pain Foley in placed  Assessment/Plan: Principal Problem:   E coli bacteremia Active Problems:   Sepsis ([place])   Ventricular tachycardia ([place])   Pressure injury of skin   Urinary tract infection   Acute pulmonary edema (HCC)   Drug-induced torsades de pointes   Atrial fibrillation with rapid ventricular response (HCC)   Acute respiratory failure with hypoxemia (HCC)   Aspiration pneumonia of right upper lobe due to gastric secretions (HCC)  Sepsis immunosuppressed on chronic prednisone and methotrexate , acute metabolic encephalopathy, ecoli uti and ecoli bacteremia Repeat blood culture no growth No TEE needed per cardiology He finished 7 days of rocephin  On 7/11 for treatment of bacteremia per ID recommendation   Torsades /pulseless VT cardiac arrest in the evening of 7/5 in the setting of sepsis and tikosyn use  S/p cpr and subsequently was shocked out of v tach with ROSC He was on lidocaine drip from 7/6 to 7/8 He is off tikosyn Avoid Qtc prolonging agent c/o sternum pain on deep inspiration EP input appreciated, patient is to follow up with Dr Lovena Le in 4weeks  afib on lopressor/apixaban In afib with intermittent pacing  Chronic systolic chf Ef 18% S/p biv pacemaker  bilateral pleural effusion and pedal edema Improving on iv lasix  CKDIII Cr at baseline, renal dosing meds  H/o prostate cancer s/p radiation with radiation cystitis Urinary retention, foley placed on 7/5 Recurrent UTI Renal US no hydro, ct ab/pel no acute findings Will need to follow up with urology Dr. Amalia Hailey    History of previous CVA with vascular dementia - No new focal neuro deficits. Continue Aricept, statin and Eliquis  Psoriatic arthritis -  methotrexate and daily prednisone held  Stage I sacral pressure injury presented on admission   FTT, under CIR eval   Code Status: partial code, no cpr  Family Communication: patient   Disposition Plan: pending cir eval   Consultants:  Critical care  Cardiology/EP  CIR  ID  Procedures:  Cpr/defibrillation  Foley insertion    Antibiotics:  As above   Objective: BP (!) 153/53   Pulse (!) 111   Temp 98 F (36.7 C) (Oral)   Resp 19   Ht 6\' 3"  (1.905 m)   Wt 94.6 kg (208 lb 8.9 oz)   SpO2 95%   BMI 26.07 kg/m   Intake/Output Summary (Last 24 hours) at 12/29/2017 1537 Last data filed at 12/29/2017 1520 Gross per 24 hour  Intake 1160 ml  Output 1950 ml  Net -790 ml   Filed Weights   12/26/17 0600 12/27/17 0500 12/28/17 0500  Weight: 94.6 kg (208 lb 8.9 oz) 95.1 kg (209 lb 10.5 oz) 94.6 kg (208 lb 8.9 oz)    Exam: Patient is examined daily including today on 12/29/2017, exams remain the same as of yesterday except that has changed    General:  Frail and weak, not oriented to time ( likely baseline)  Cardiovascular: paced rhythm  Respiratory: diminished at basis, no wheezing, no rales, no rhonchi  Abdomen: Soft/ND/NT, positive BS  Musculoskeletal: bilateral pedal Edema  Neuro: alert, oriented to person and place, not to time  Data Reviewed: Basic Metabolic Panel: Recent Labs  Lab 12/23/17 2205  12/24/17 5631  12/25/17 0334 12/26/17 0314 12/27/17 0303 12/28/17  3016 12/29/17 0748  NA 143   < > 145   < > 144 143 142 141 143  K 4.5   < > 3.8   < > 3.6 3.6 4.0 3.6 3.5  CL 112*   < > 110   < > 112* 109 109 107 106  CO2 16*   < > 20*   < > 24 25 17* 25 27  GLUCOSE 200*   < > 133*   < > 137* 135* 124* 128* 121*  BUN 28*   < > 33*   < > 36* 35* 39* 38* 40*  CREATININE 1.66*   < > 1.53*   < > 1.52* 1.49* 1.57* 1.49* 1.62*  CALCIUM 8.9   < > 9.2   < > 9.0 9.0 9.0 9.1 9.2  MG 2.0  --  2.0  --  2.1  --   --   --   --   PHOS 4.1  --   --   --    --   --   --   --   --    < > = values in this interval not displayed.   Liver Function Tests: Recent Labs  Lab 12/23/17 0813 12/24/17 0342  AST 31 145*  ALT 28 125*  ALKPHOS 85 90  BILITOT 0.8 0.8  PROT 6.0* 5.4*  ALBUMIN 3.1* 2.5*   No results for input(s): LIPASE, AMYLASE in the last 168 hours. No results for input(s): AMMONIA in the last 168 hours. CBC: Recent Labs  Lab 12/23/17 0813  12/24/17 0342 12/25/17 0334 12/26/17 0314 12/27/17 0303 12/28/17 0156  WBC 4.3   < > 28.5* 18.3* 15.1* 11.9* 14.4*  NEUTROABS 4.1  --   --  17.2*  --  10.1* 12.2*  HGB 12.1*   < > 10.5* 9.9* 9.8* 10.3* 10.1*  HCT 39.6   < > 34.1* 33.1* 31.8* 32.8* 32.8*  MCV 91.5   < > 91.7 92.5 90.3 87.0 89.4  PLT 235   < > 193 163 139* 131* 163   < > = values in this interval not displayed.   Cardiac Enzymes:   Recent Labs  Lab 12/23/17 2205 12/24/17 0342 12/24/17 0853  TROPONINI 1.55* 1.55* 1.18*   BNP (last 3 results) No results for input(s): BNP in the last 8760 hours.  ProBNP (last 3 results) No results for input(s): PROBNP in the last 8760 hours.  CBG: Recent Labs  Lab 12/28/17 0350 12/28/17 0744 12/28/17 1144 12/28/17 1609 12/29/17 1226  GLUCAP 116* 121* 116* 106* 129*    Recent Results (from the past 240 hour(s))  Blood Culture (routine x 2)     Status: Abnormal   Collection Time: 12/23/17  8:13 AM  Result Value Ref Range Status   Specimen Description BLOOD RIGHT ANTECUBITAL  Final   Special Requests   Final    BOTTLES DRAWN AEROBIC AND ANAEROBIC Blood Culture adequate volume   Culture  Setup Time   Final    GRAM NEGATIVE RODS IN BOTH AEROBIC AND ANAEROBIC BOTTLES CRITICAL RESULT CALLED TO, READ BACK BY AND VERIFIED WITHFerne Coe [facility] 2126 12/23/17 A BROWNING Performed at [facility], [place] [address] [place], [place] [address]    Culture ESCHERICHIA COLI (A)  Final   Report Status 12/25/2017 FINAL  Final   Organism ID, Bacteria ESCHERICHIA COLI  Final       Susceptibility   Escherichia coli - MIC*    AMPICILLIN >=32 RESISTANT  Resistant     CEFAZOLIN <=4 SENSITIVE Sensitive     CEFEPIME <=1 SENSITIVE Sensitive     CEFTAZIDIME <=1 SENSITIVE Sensitive     CEFTRIAXONE <=1 SENSITIVE Sensitive     CIPROFLOXACIN <=0.25 SENSITIVE Sensitive     GENTAMICIN <=1 SENSITIVE Sensitive     IMIPENEM <=0.25 SENSITIVE Sensitive     TRIMETH/SULFA <=20 SENSITIVE Sensitive     AMPICILLIN/SULBACTAM >=32 RESISTANT Resistant     PIP/TAZO <=4 SENSITIVE Sensitive     Extended ESBL NEGATIVE Sensitive     * ESCHERICHIA COLI  Blood Culture ID Panel (Reflexed)     Status: Abnormal   Collection Time: 12/23/17  8:13 AM  Result Value Ref Range Status   Enterococcus species NOT DETECTED NOT DETECTED Final   Listeria monocytogenes NOT DETECTED NOT DETECTED Final   Staphylococcus species NOT DETECTED NOT DETECTED Final   Staphylococcus aureus NOT DETECTED NOT DETECTED Final   Streptococcus species NOT DETECTED NOT DETECTED Final   Streptococcus agalactiae NOT DETECTED NOT DETECTED Final   Streptococcus pneumoniae NOT DETECTED NOT DETECTED Final   Streptococcus pyogenes NOT DETECTED NOT DETECTED Final   Acinetobacter baumannii NOT DETECTED NOT DETECTED Final   Enterobacteriaceae species DETECTED (A) NOT DETECTED Final    Comment: Enterobacteriaceae represent a large family of gram-negative bacteria, not a single organism. CRITICAL RESULT CALLED TO, READ BACK BY AND VERIFIED WITH: E MARTIN PHARMD 2126 12/23/17 A BROWNING    Enterobacter cloacae complex NOT DETECTED NOT DETECTED Final   Escherichia coli DETECTED (A) NOT DETECTED Final    Comment: CRITICAL RESULT CALLED TO, READ BACK BY AND VERIFIED WITH: E MARTIN PHARMD 2126 12/23/17 A BROWNING    Klebsiella oxytoca NOT DETECTED NOT DETECTED Final   Klebsiella pneumoniae NOT DETECTED NOT DETECTED Final   Proteus species NOT DETECTED NOT DETECTED Final   Serratia marcescens NOT DETECTED NOT DETECTED Final   Carbapenem  resistance NOT DETECTED NOT DETECTED Final   Haemophilus influenzae NOT DETECTED NOT DETECTED Final   Neisseria meningitidis NOT DETECTED NOT DETECTED Final   Pseudomonas aeruginosa NOT DETECTED NOT DETECTED Final   Candida albicans NOT DETECTED NOT DETECTED Final   Candida glabrata NOT DETECTED NOT DETECTED Final   Candida krusei NOT DETECTED NOT DETECTED Final   Candida parapsilosis NOT DETECTED NOT DETECTED Final   Candida tropicalis NOT DETECTED NOT DETECTED Final    Comment: Performed at [facility], [place] [address] [place], [place] [address]  Blood Culture (routine x 2)     Status: Abnormal   Collection Time: 12/23/17  8:30 AM  Result Value Ref Range Status   Specimen Description BLOOD LEFT HAND  Final   Special Requests   Final    BOTTLES DRAWN AEROBIC AND ANAEROBIC Blood Culture results may not be optimal due to an inadequate volume of blood received in culture bottles   Culture  Setup Time   Final    GRAM NEGATIVE RODS IN BOTH AEROBIC AND ANAEROBIC BOTTLES CRITICAL VALUE NOTED.  VALUE IS CONSISTENT WITH PREVIOUSLY REPORTED AND CALLED VALUE.    Culture (A)  Final    ESCHERICHIA COLI SUSCEPTIBILITIES PERFORMED ON PREVIOUS CULTURE WITHIN THE LAST 5 DAYS. Performed at [facility], [place] [address], [place], [place] [address]    Report Status 12/25/2017 FINAL  Final  Urine culture     Status: Abnormal   Collection Time: 12/23/17  8:44 AM  Result Value Ref Range Status   Specimen Description URINE, CATHETERIZED  Final  Special Requests   Final    NONE Performed at [facility], [place] [address] [place], [place] [address]    Culture >=100,000 COLONIES/mL ESCHERICHIA COLI (A)  Final   Report Status 12/25/2017 FINAL  Final   Organism ID, Bacteria ESCHERICHIA COLI (A)  Final      Susceptibility   Escherichia coli - MIC*    AMPICILLIN >=32 RESISTANT Resistant     CEFAZOLIN 16 SENSITIVE Sensitive     CEFTRIAXONE <=1 SENSITIVE Sensitive     CIPROFLOXACIN  <=0.25 SENSITIVE Sensitive     GENTAMICIN <=1 SENSITIVE Sensitive     IMIPENEM <=0.25 SENSITIVE Sensitive     NITROFURANTOIN <=16 SENSITIVE Sensitive     TRIMETH/SULFA <=20 SENSITIVE Sensitive     AMPICILLIN/SULBACTAM >=32 RESISTANT Resistant     PIP/TAZO <=4 SENSITIVE Sensitive     Extended ESBL NEGATIVE Sensitive     * >=100,000 COLONIES/mL ESCHERICHIA COLI  MRSA PCR Screening     Status: None   Collection Time: 12/23/17  9:06 PM  Result Value Ref Range Status   MRSA by PCR NEGATIVE NEGATIVE Final    Comment:        The GeneXpert MRSA Assay (FDA approved for NASAL specimens only), is one component of a comprehensive MRSA colonization surveillance program. It is not intended to diagnose MRSA infection nor to guide or monitor treatment for MRSA infections. Performed at [facility], [place] [address], [place], [place] [address]   Culture, blood (Routine X 2) w Reflex to ID Panel     Status: None   Collection Time: 12/24/17 12:15 PM  Result Value Ref Range Status   Specimen Description BLOOD BLOOD LEFT HAND  Final   Special Requests   Final    BOTTLES DRAWN AEROBIC ONLY Blood Culture results may not be optimal due to an inadequate volume of blood received in culture bottles   Culture   Final    NO GROWTH 5 DAYS Performed at [facility], [place] [address], [place], [place] [address]    Report Status 12/29/2017 FINAL  Final  Culture, blood (Routine X 2) w Reflex to ID Panel     Status: None   Collection Time: 12/24/17 12:15 PM  Result Value Ref Range Status   Specimen Description BLOOD BLOOD RIGHT HAND  Final   Special Requests   Final    BOTTLES DRAWN AEROBIC AND ANAEROBIC Blood Culture adequate volume   Culture   Final    NO GROWTH 5 DAYS Performed at [facility], [place] [address] [place], [place] [address]    Report Status 12/29/2017 FINAL  Final     Studies: No results found.  Scheduled Meds: . acetaminophen  650 mg Oral Q6H  . apixaban  2.5 mg Oral  BID  . atorvastatin  20 mg Oral Daily  . donepezil  10 mg Oral QHS  . DULoxetine  30 mg Oral QHS  . feeding supplement (PRO-STAT SUGAR FREE 64)  30 mL Oral TID  . ferrous sulfate  325 mg Oral BID WC  . folic acid  1 mg Oral QHS  . furosemide  40 mg Intravenous Daily  . lidocaine  1 patch Transdermal Q24H  . metoprolol succinate  25 mg Oral QHS  . pantoprazole  40 mg Oral QHS    Continuous Infusions: . sodium chloride 20 mL (12/28/17 1658)  . cefTRIAXone (ROCEPHIN)  IV 2 g (12/28/17 1659)     Time spent: 72mins  I have personally reviewed and interpreted on  12/29/2017 daily labs, tele strips, imagings as discussed above under date review session and assessment and plans.  I reviewed all nursing notes, pharmacy notes, consultant notes,  vitals, pertinent old records  I have discussed plan of care as described above with RN , patient  on 12/29/2017   Florencia Reasons MD, PhD  [facility] Pager [phone]. If 7PM-7AM, please contact night-coverage at [email or web address], password [facility] 12/29/2017, 3:37 PM  LOS: 6 days

## 2017-12-29 NOTE — Progress Notes (Signed)
Inpatient Rehabilitation Admissions Coordinator  I met with patient at bedside to discuss a possible inpt rehab admission pending insurance approval. I will contact his wife to discuss her preference for rehab. I have begun insurance authorization with [employer].  Danne Baxter, RN, MSN Rehab Admissions Coordinator [phone] 12/29/2017 1:44 PM

## 2017-12-29 NOTE — Care Management Important Message (Signed)
Important Message  Patient Details  Name: Christopher Baker MRN: [id number] Date of Birth: Jul 07, 1933   Medicare Important Message Given:  No Patient was not able to sign so an unsigned copy left with the patient   Orbie Pyo 12/29/2017, 2:18 PM

## 2017-12-29 NOTE — NC FL2 (Signed)
[place] LEVEL OF CARE SCREENING TOOL     IDENTIFICATION  Patient Name: Christopher Baker Birthdate: [birth year] Sex: male Admission Date (Current Location): 12/23/2017  [facility] and [place] Number:  Herbalist and Address:  [facility] [place], [place] [address], [place], [place] [address]      Provider Number: [id number]  Attending Physician Name and Address:  Florencia Reasons, MD  Relative Name and Phone Number:  [name], spouse, [phone]    Current Level of Care: Hospital Recommended Level of Care: [place] Prior Approval Number:    Date Approved/Denied:   PASRR Number: [id number]  Discharge Plan: SNF    Current Diagnoses: Patient Active Problem List   Diagnosis Date Noted  . Atrial fibrillation with rapid ventricular response ([place])   . Acute respiratory failure with hypoxemia ([place])   . Aspiration pneumonia of right upper lobe due to gastric secretions ([place])   . Drug-induced torsades de pointes 12/24/2017  . Sepsis ([place]) 12/23/2017  . Ventricular tachycardia ([place]) 12/23/2017  . Pressure injury of skin 12/23/2017  . E coli bacteremia 12/23/2017  . Urinary tract infection 12/23/2017  . Acute pulmonary edema ([place]) 12/23/2017  . Sepsis, unspecified organism ([place]) 10/30/2017  . Acute urinary retention 10/30/2017  . Chronic systolic heart failure ([place]) 09/07/2017  . S/P shoulder replacement 06/27/2015  . Residual cognitive deficit as late effect of stroke 04/15/2015  . Lung nodule   . Weakness generalized 12/11/2014  . Leukocytosis 12/11/2014  . Restless leg syndrome 12/11/2014  . Normocytic anemia 12/11/2014  . Abnormal EKG 12/11/2014  . Gait disturbance, post-stroke 10/22/2014  . Partial seizures ([place]) 06/18/2014  . Vitamin B12 deficiency 06/18/2014  . OSA (obstructive sleep apnea) 02/26/2014  . Cognitive deficits as late effect of cerebrovascular disease 01/11/2014  . Fatigue 09/20/2013  . Intermittent  lightheadedness 06/19/2013  . Low blood pressure, not hypotension 06/19/2013  . Vascular dementia 04/09/2013  . History of completed stroke 04/03/2013  . Occlusion and stenosis of carotid artery without mention of cerebral infarction 11/21/2012  . OA (osteoarthritis) of knee 12/20/2011  . Methotrexate, long term, current use 09/10/2011  . Knee pain, left 07/27/2011  . Pacemaker-Medtronic 09/08/2010  . UNSPECIFIED VISUAL DISTURBANCE 07/20/2010  . IRON DEFIC ANEMIA [place] DIET IRON INTAKE 04/20/2010  . PSORIASIS 04/20/2010  . DISUSE OSTEOPOROSIS 05/12/2009  . SPONDYLOSIS, LUMBAR, WITH RADICULOPATHY 10/15/2008  . OTHER SPECIFIED DERMATOMYCOSES 06/24/2008  . VARICOSE VEINS LOWER EXTREMITIES W/INFLAMMATION 12/25/2007  . INSOMNIA WITH SLEEP APNEA UNSPECIFIED 08/31/2007  . GERD 03/23/2007  . Atrial fibrillation, chronic ([place]) 01/31/2007  . PSORIATIC ARTHRITIS 01/31/2007  . Hyperlipidemia 01/19/2007  . Essential hypertension 01/19/2007  . PROSTATE CANCER, HX OF 01/19/2007    Orientation RESPIRATION BLADDER Height & Weight     Self, Situation, Place  Normal Continent, Indwelling catheter Weight: 94.6 kg (208 lb 8.9 oz) Height:  6\' 3"  (190.5 cm)  BEHAVIORAL SYMPTOMS/MOOD NEUROLOGICAL BOWEL NUTRITION STATUS      Incontinent Diet(Please see DC Summary)  AMBULATORY STATUS COMMUNICATION OF NEEDS Skin   Extensive Assist Verbally                         Personal Care Assistance Level of Assistance  Bathing, Feeding, Dressing Bathing Assistance: Maximum assistance Feeding assistance: Limited assistance Dressing Assistance: Limited assistance     Functional Limitations Info  Sight, Hearing, Speech Sight Info: Adequate Hearing Info: Adequate Speech Info: Adequate    SPECIAL CARE FACTORS  FREQUENCY  PT (By licensed PT), OT (By licensed OT)     PT Frequency: 5x/week OT Frequency: 3x/week            Contractures      Additional Factors Info  Code Status, Allergies,  Psychotropic Code Status Info: Partial Allergies Info: Ciprofloxacin, Hydrocodone-acetaminophen, Other, Shellfish Allergy, TramadolCiprofloxacin, Hydrocodone-acetaminophen, Other, Shellfish Allergy, Tramadol Psychotropic Info: Cymbalta         Current Medications (12/29/2017):  This is the current hospital active medication list Current Facility-Administered Medications  Medication Dose Route Frequency Provider Last Rate Last Dose  . 0.9 %  sodium chloride infusion  250 mL Intravenous PRN Omar Person, NP 10 mL/hr at 12/28/17 1658 20 mL at 12/28/17 1658  . acetaminophen (TYLENOL) tablet 650 mg  650 mg Oral Q6H Aldean Jewett, MD   650 mg at 12/29/17 0140  . apixaban (ELIQUIS) tablet 2.5 mg  2.5 mg Oral BID Lyndee Leo, RPH   2.5 mg at 12/28/17 2249  . atorvastatin (LIPITOR) tablet 20 mg  20 mg Oral Daily Caren Griffins, MD   20 mg at 12/28/17 0835  . cefTRIAXone (ROCEPHIN) 2 g in sodium chloride 0.9 % 100 mL IVPB  2 g Intravenous Q24H Simonne Maffucci B, MD 200 mL/hr at 12/28/17 1659 2 g at 12/28/17 1659  . donepezil (ARICEPT) tablet 10 mg  10 mg Oral QHS Caren Griffins, MD   10 mg at 12/28/17 2250  . DULoxetine (CYMBALTA) DR capsule 30 mg  30 mg Oral QHS Caren Griffins, MD   30 mg at 12/28/17 2249  . feeding supplement (PRO-STAT SUGAR FREE 64) liquid 30 mL  30 mL Oral TID Simonne Maffucci B, MD   30 mL at 12/28/17 2249  . ferrous sulfate tablet 325 mg  325 mg Oral BID WC Caren Griffins, MD   325 mg at 12/28/17 1653  . folic acid (FOLVITE) tablet 1 mg  1 mg Oral QHS Caren Griffins, MD   1 mg at 12/28/17 2250  . furosemide (LASIX) injection 40 mg  40 mg Intravenous Daily Aldean Jewett, MD   40 mg at 12/28/17 0835  . Gerhardt's butt cream   Topical PRN Aldean Jewett, MD   1 application at 36/14/43 1813  . lidocaine (LIDODERM) 5 % 1 patch  1 patch Transdermal Q24H Means, Lolita Cram, MD   1 patch at 12/28/17 2054  . metoprolol succinate (TOPROL-XL) 24 hr tablet 25  mg  25 mg Oral QHS Caren Griffins, MD   25 mg at 12/28/17 2249  . oxyCODONE-acetaminophen (PERCOCET/ROXICET) 5-325 MG per tablet 0.5-1 tablet  0.5-1 tablet Oral BID PRN Caren Griffins, MD   1 tablet at 12/28/17 0622  . pantoprazole (PROTONIX) EC tablet 40 mg  40 mg Oral QHS Caren Griffins, MD   40 mg at 12/28/17 2249     Discharge Medications: Please see discharge summary for a list of discharge medications.  Relevant Imaging Results:  Relevant Lab Results:   Additional Information SSN: [place], [place]

## 2017-12-29 NOTE — Progress Notes (Addendum)
Electrophysiology Rounding Note  Patient Name: Christopher Baker Date of Encounter: 12/29/2017  Primary Cardiologist: Radford Pax Electrophysiologist: Lovena Le   Subjective   The patient is doing well today.  At this time, the patient denies chest pain, shortness of breath, or any new concerns. He looks much improved  Inpatient Medications    Scheduled Meds: . acetaminophen  650 mg Oral Q6H  . apixaban  2.5 mg Oral BID  . atorvastatin  20 mg Oral Daily  . donepezil  10 mg Oral QHS  . DULoxetine  30 mg Oral QHS  . feeding supplement (PRO-STAT SUGAR FREE 64)  30 mL Oral TID  . ferrous sulfate  325 mg Oral BID WC  . folic acid  1 mg Oral QHS  . furosemide  40 mg Intravenous Daily  . lidocaine  1 patch Transdermal Q24H  . metoprolol succinate  25 mg Oral QHS  . pantoprazole  40 mg Oral QHS   Continuous Infusions: . sodium chloride 20 mL (12/28/17 1658)  . cefTRIAXone (ROCEPHIN)  IV 2 g (12/28/17 1659)   PRN Meds: sodium chloride, Gerhardt's butt cream, oxyCODONE-acetaminophen   Vital Signs    Vitals:   12/29/17 0440 12/29/17 0500 12/29/17 0748 12/29/17 0849  BP: 128/74 131/69    Pulse: 63 (!) 55    Resp: (!) 22 17    Temp: (!) 97.4 F (36.3 C)  98.2 F (36.8 C)   TempSrc: Oral  Oral   SpO2: 97% (!) 87%  94%  Weight:      Height:        Intake/Output Summary (Last 24 hours) at 12/29/2017 1016 Last data filed at 12/29/2017 0400 Gross per 24 hour  Intake 1160 ml  Output 750 ml  Net 410 ml   Filed Weights   12/26/17 0600 12/27/17 0500 12/28/17 0500  Weight: 208 lb 8.9 oz (94.6 kg) 209 lb 10.5 oz (95.1 kg) 208 lb 8.9 oz (94.6 kg)    Physical Exam    GEN- The patient is elderly appearing, alert and oriented x 3 today.   Head- normocephalic, atraumatic Eyes-  Sclera clear, conjunctiva pink Ears- hearing intact Oropharynx- clear Neck- supple Lungs- Clear to ausculation bilaterally, normal work of breathing Heart- Irregular rate and rhythm  GI- soft, NT, ND, +  BS Extremities- no clubbing, cyanosis, or edema Skin- no rash or lesion Psych- euthymic mood, full affect Neuro- strength and sensation are intact  Labs    CBC Recent Labs    12/27/17 0303 12/28/17 0156  WBC 11.9* 14.4*  NEUTROABS 10.1* 12.2*  HGB 10.3* 10.1*  HCT 32.8* 32.8*  MCV 87.0 89.4  PLT 131* 782   Basic Metabolic Panel Recent Labs    12/28/17 0156 12/29/17 0748  NA 141 143  K 3.6 3.5  CL 107 106  CO2 25 27  GLUCOSE 128* 121*  BUN 38* 40*  CREATININE 1.49* 1.62*  CALCIUM 9.1 9.2     Telemetry    AF,  V rates controlled (personally reviewed)  Radiology    Ct Abdomen Wo Contrast  Result Date: 12/27/2017 CLINICAL DATA:  Nonpulsatile abdominal mass. Recent fall. Improving pulmonary opacities with diuresis. EXAM: CT CHEST AND ABDOMEN WITHOUT CONTRAST TECHNIQUE: Multidetector CT imaging of the chest and abdomen was performed following the standard protocol without intravenous contrast. COMPARISON:  Recent chest radiographs. Pelvic CT 03/08/2016, chest CT 06/25/2015 and abdominopelvic CT 02/25/2015. FINDINGS: CT CHEST FINDINGS WITHOUT CONTRAST Cardiovascular: Diffuse atherosclerosis of the aorta, great vessels and coronary arteries.  No acute vascular findings are seen on noncontrast imaging. The heart is mildly enlarged. Patient has a left subclavian pacemaker with leads in stable position. Probable aortic valvular calcifications. No significant pericardial effusion. Mediastinum/Nodes: There are no enlarged mediastinal, hilar or axillary lymph nodes. Small mediastinal lymph nodes are stable. The esophagus and thyroid gland appear unremarkable. Lungs/Pleura: Small dependent bilateral pleural effusions with associated bibasilar atelectasis. There is asymmetric interstitial prominence throughout the right lung as seen on recent radiographs. There is underlying moderate centrilobular emphysema. No focal nodule or endobronchial lesion seen. Musculoskeletal/Chest wall: No chest  wall mass or suspicious osseous findings. A suggested midsternal fracture on sagittal image 110/7 is attributed to breathing artifact based on the axial images and is not associated with any surrounding soft tissue swelling. Right shoulder reverse arthroplasty and muscular atrophy noted. CT ABDOMEN FINDINGS Hepatobiliary: Multiple hepatic cysts appear stable. There is no significant biliary dilatation status post cholecystectomy. Pancreas: Unremarkable. No pancreatic ductal dilatation or surrounding inflammatory changes. Spleen: Normal in size without focal abnormality. Adrenals/Urinary Tract: Both adrenal glands appear normal. Stable nonobstructing 5 mm calculus in the lower pole of the right kidney (image 82/3). No evidence of hydronephrosis, proximal ureteral calculus or asymmetric perinephric soft tissue stranding. Numerous bilateral renal cysts are grossly unchanged in size and appearance. Stomach/Bowel: No evidence of bowel wall thickening, distention or surrounding inflammatory change. Vascular/Lymphatic: There are no enlarged abdominal lymph nodes. There are stable small lymph nodes in the porta hepatis. There is diffuse aortic and branch vessel atherosclerosis. Other: The visualized anterior abdominal wall is intact. No ascites. No abdominal wall mass or hernia seen. Musculoskeletal: No acute or significant osseous findings. Multilevel spondylosis status post L4-5 fusion. Disc degeneration appears progressive at L2-3 and L3-4. IMPRESSION: 1. In correlation with recent chest radiographs, asymmetric interstitial opacities in the right lung, bilateral pleural effusions and bibasilar atelectasis probably reflect resolving pulmonary edema superimposed on emphysema. Continued chest radiographic follow up recommended. Emphysema (ICD10-J43.9). 2. No acute findings seen within the abdomen. 3. Stable nonobstructing right renal calculus and bilateral renal cysts. No hydronephrosis or acute inflammatory changes seen.  4. Aortic Atherosclerosis (ICD10-I70.0). Progressive lumbar disc degeneration. 5. No evidence of abdominal mass or definite acute osseous findings. Suggested sternal fracture on the sagittal images is attributed to breathing artifact. Correlate clinically. Electronically Signed   By: Richardean Sale M.D.   On: 12/27/2017 17:36   Ct Chest Wo Contrast  Result Date: 12/27/2017 CLINICAL DATA:  Nonpulsatile abdominal mass. Recent fall. Improving pulmonary opacities with diuresis. EXAM: CT CHEST AND ABDOMEN WITHOUT CONTRAST TECHNIQUE: Multidetector CT imaging of the chest and abdomen was performed following the standard protocol without intravenous contrast. COMPARISON:  Recent chest radiographs. Pelvic CT 03/08/2016, chest CT 06/25/2015 and abdominopelvic CT 02/25/2015. FINDINGS: CT CHEST FINDINGS WITHOUT CONTRAST Cardiovascular: Diffuse atherosclerosis of the aorta, great vessels and coronary arteries. No acute vascular findings are seen on noncontrast imaging. The heart is mildly enlarged. Patient has a left subclavian pacemaker with leads in stable position. Probable aortic valvular calcifications. No significant pericardial effusion. Mediastinum/Nodes: There are no enlarged mediastinal, hilar or axillary lymph nodes. Small mediastinal lymph nodes are stable. The esophagus and thyroid gland appear unremarkable. Lungs/Pleura: Small dependent bilateral pleural effusions with associated bibasilar atelectasis. There is asymmetric interstitial prominence throughout the right lung as seen on recent radiographs. There is underlying moderate centrilobular emphysema. No focal nodule or endobronchial lesion seen. Musculoskeletal/Chest wall: No chest wall mass or suspicious osseous findings. A suggested midsternal fracture on sagittal image 110/7  is attributed to breathing artifact based on the axial images and is not associated with any surrounding soft tissue swelling. Right shoulder reverse arthroplasty and muscular  atrophy noted. CT ABDOMEN FINDINGS Hepatobiliary: Multiple hepatic cysts appear stable. There is no significant biliary dilatation status post cholecystectomy. Pancreas: Unremarkable. No pancreatic ductal dilatation or surrounding inflammatory changes. Spleen: Normal in size without focal abnormality. Adrenals/Urinary Tract: Both adrenal glands appear normal. Stable nonobstructing 5 mm calculus in the lower pole of the right kidney (image 82/3). No evidence of hydronephrosis, proximal ureteral calculus or asymmetric perinephric soft tissue stranding. Numerous bilateral renal cysts are grossly unchanged in size and appearance. Stomach/Bowel: No evidence of bowel wall thickening, distention or surrounding inflammatory change. Vascular/Lymphatic: There are no enlarged abdominal lymph nodes. There are stable small lymph nodes in the porta hepatis. There is diffuse aortic and branch vessel atherosclerosis. Other: The visualized anterior abdominal wall is intact. No ascites. No abdominal wall mass or hernia seen. Musculoskeletal: No acute or significant osseous findings. Multilevel spondylosis status post L4-5 fusion. Disc degeneration appears progressive at L2-3 and L3-4. IMPRESSION: 1. In correlation with recent chest radiographs, asymmetric interstitial opacities in the right lung, bilateral pleural effusions and bibasilar atelectasis probably reflect resolving pulmonary edema superimposed on emphysema. Continued chest radiographic follow up recommended. Emphysema (ICD10-J43.9). 2. No acute findings seen within the abdomen. 3. Stable nonobstructing right renal calculus and bilateral renal cysts. No hydronephrosis or acute inflammatory changes seen. 4. Aortic Atherosclerosis (ICD10-I70.0). Progressive lumbar disc degeneration. 5. No evidence of abdominal mass or definite acute osseous findings. Suggested sternal fracture on the sagittal images is attributed to breathing artifact. Correlate clinically. Electronically  Signed   By: Richardean Sale M.D.   On: 12/27/2017 17:36   US Renal  Result Date: 12/27/2017 CLINICAL DATA:  84 year old with personal history of prostate cancer, presenting with urinary tract infection and renal insufficiency. Current history of hypertension and diabetes. EXAM: RENAL / URINARY TRACT ULTRASOUND COMPLETE COMPARISON:  CT abdomen and pelvis 02/25/2015. FINDINGS: Right Kidney: Length: Approximately 10.9 cm. Echogenic renal parenchyma. Approximate 1.8 x 1.9 x 1.2 cm cyst arising from the UPPER pole, with smaller cysts elsewhere in the kidney, as noted on the prior CT. No visible solid masses. No hydronephrosis. Left Kidney: Length: Approximately 12.7 cm. Echogenic renal parenchyma. Multiple cysts as noted on the prior CT, the largest arising from the mid kidney measuring approximately 4.0 x 3.4 x 4.7 cm. No visible solid masses. No hydronephrosis. Bladder: Decompressed by Foley catheter. IMPRESSION: 1. Echogenic renal parenchyma bilaterally indicating medical renal disease. 2. No evidence of hydronephrosis involving either kidney to suggest obstruction. 3. Multiple BILATERAL renal cysts as noted on a prior CT in 2016. Electronically Signed   By: Evangeline Dakin M.D.   On: 12/27/2017 12:42   Dg Chest Port 1 View  Result Date: 12/28/2017 CLINICAL DATA:  Short of breath, respiratory failure EXAM: PORTABLE CHEST 1 VIEW COMPARISON:  12/27/2017 FINDINGS: LEFT-sided pacemaker overlies normal cardiac silhouette. Bilateral small effusions. Mild interstitial edema pattern. No pneumothorax. No focal consolidation. IMPRESSION: Bilateral pleural effusions and mild interstitial edema Electronically Signed   By: Suzy Bouchard M.D.   On: 12/28/2017 08:47    Assessment & Plan    1.  Persistent atrial fibrillation Tikosyn stopped 2/2 torsades this admission Will leave in AF for now, V rates are controlled Continue Eliquis for CHADS2VASC of 6  2.  Torsades In the setting of sepsis and Tikosyn use No  recurrence Avoid QT prolonging drugs  3.  Sepsis Improving Per primary team  4.  Deconditioning Plan for CIR noted   5.  Chronic systolic heart failure Euvolemic on exam Continue current therapy  Appt made with Dr Lovena Le in 4 weeks (entered in AVS).   Electrophysiology team to see as needed while here. Please call with questions.  Signed, Chanetta Marshall, NP  12/29/2017, 10:16 AM   EP Attending  Patient seen and examined. Agree with the findings as noted above. He continues to improve. His exam reveals an IRIR rhythm and clear lungs. Extremities with out edema. Tele reveals atrial fib with ventricular pacing. He will continue his anti-biotics. Continue CHF meds. Followup as outlined above.   Mikle Bosworth.D.

## 2017-12-29 NOTE — Progress Notes (Signed)
CPAP set up for patient for the night.  Pt declined to have mask placed at this time.  Pt appears slightly confused.  Charge RN made aware of pt declining to use CPAP at this time. Machine on stand-by at bedside for patient use.

## 2017-12-30 ENCOUNTER — Inpatient Hospital Stay (HOSPITAL_COMMUNITY)
Admission: RE | Admit: 2017-12-30 | Discharge: 2018-01-18 | DRG: 945 | Disposition: A | Discharge status: Home Health | Payer: PPO | Source: Intra-hospital | Attending: Physical Medicine & Rehabilitation | Admitting: Physical Medicine & Rehabilitation

## 2017-12-30 ENCOUNTER — Ambulatory Visit: Payer: Self-pay

## 2017-12-30 ENCOUNTER — Encounter (HOSPITAL_COMMUNITY): Payer: Self-pay

## 2017-12-30 DIAGNOSIS — I10 Essential (primary) hypertension: Secondary | ICD-10-CM

## 2017-12-30 DIAGNOSIS — R0989 Other specified symptoms and signs involving the circulatory and respiratory systems: Secondary | ICD-10-CM

## 2017-12-30 DIAGNOSIS — E785 Hyperlipidemia, unspecified: Secondary | ICD-10-CM | POA: Diagnosis present

## 2017-12-30 DIAGNOSIS — F419 Anxiety disorder, unspecified: Secondary | ICD-10-CM | POA: Diagnosis present

## 2017-12-30 DIAGNOSIS — M109 Gout, unspecified: Secondary | ICD-10-CM | POA: Diagnosis not present

## 2017-12-30 DIAGNOSIS — Z8546 Personal history of malignant neoplasm of prostate: Secondary | ICD-10-CM | POA: Diagnosis not present

## 2017-12-30 DIAGNOSIS — Z7982 Long term (current) use of aspirin: Secondary | ICD-10-CM

## 2017-12-30 DIAGNOSIS — I5032 Chronic diastolic (congestive) heart failure: Secondary | ICD-10-CM | POA: Diagnosis not present

## 2017-12-30 DIAGNOSIS — L89151 Pressure ulcer of sacral region, stage 1: Secondary | ICD-10-CM | POA: Diagnosis present

## 2017-12-30 DIAGNOSIS — Z96651 Presence of right artificial knee joint: Secondary | ICD-10-CM | POA: Diagnosis present

## 2017-12-30 DIAGNOSIS — D72829 Elevated white blood cell count, unspecified: Secondary | ICD-10-CM | POA: Diagnosis not present

## 2017-12-30 DIAGNOSIS — I5022 Chronic systolic (congestive) heart failure: Secondary | ICD-10-CM | POA: Diagnosis present

## 2017-12-30 DIAGNOSIS — R2689 Other abnormalities of gait and mobility: Secondary | ICD-10-CM | POA: Diagnosis not present

## 2017-12-30 DIAGNOSIS — R7303 Prediabetes: Secondary | ICD-10-CM | POA: Diagnosis not present

## 2017-12-30 DIAGNOSIS — I480 Paroxysmal atrial fibrillation: Secondary | ICD-10-CM | POA: Diagnosis present

## 2017-12-30 DIAGNOSIS — Z888 Allergy status to other drugs, medicaments and biological substances status: Secondary | ICD-10-CM

## 2017-12-30 DIAGNOSIS — D62 Acute posthemorrhagic anemia: Secondary | ICD-10-CM

## 2017-12-30 DIAGNOSIS — G9340 Encephalopathy, unspecified: Secondary | ICD-10-CM | POA: Diagnosis not present

## 2017-12-30 DIAGNOSIS — Z885 Allergy status to narcotic agent status: Secondary | ICD-10-CM

## 2017-12-30 DIAGNOSIS — Z9841 Cataract extraction status, right eye: Secondary | ICD-10-CM | POA: Diagnosis not present

## 2017-12-30 DIAGNOSIS — Z96611 Presence of right artificial shoulder joint: Secondary | ICD-10-CM | POA: Diagnosis present

## 2017-12-30 DIAGNOSIS — R269 Unspecified abnormalities of gait and mobility: Secondary | ICD-10-CM | POA: Diagnosis not present

## 2017-12-30 DIAGNOSIS — D638 Anemia in other chronic diseases classified elsewhere: Secondary | ICD-10-CM | POA: Diagnosis not present

## 2017-12-30 DIAGNOSIS — Z961 Presence of intraocular lens: Secondary | ICD-10-CM | POA: Diagnosis present

## 2017-12-30 DIAGNOSIS — N183 Chronic kidney disease, stage 3 unspecified: Secondary | ICD-10-CM

## 2017-12-30 DIAGNOSIS — Z9842 Cataract extraction status, left eye: Secondary | ICD-10-CM | POA: Diagnosis not present

## 2017-12-30 DIAGNOSIS — R5381 Other malaise: Secondary | ICD-10-CM | POA: Diagnosis not present

## 2017-12-30 DIAGNOSIS — K219 Gastro-esophageal reflux disease without esophagitis: Secondary | ICD-10-CM | POA: Diagnosis present

## 2017-12-30 DIAGNOSIS — F329 Major depressive disorder, single episode, unspecified: Secondary | ICD-10-CM | POA: Diagnosis present

## 2017-12-30 DIAGNOSIS — R350 Frequency of micturition: Secondary | ICD-10-CM | POA: Diagnosis not present

## 2017-12-30 DIAGNOSIS — Z8673 Personal history of transient ischemic attack (TIA), and cerebral infarction without residual deficits: Secondary | ICD-10-CM | POA: Diagnosis not present

## 2017-12-30 DIAGNOSIS — Z981 Arthrodesis status: Secondary | ICD-10-CM

## 2017-12-30 DIAGNOSIS — I69393 Ataxia following cerebral infarction: Secondary | ICD-10-CM | POA: Diagnosis not present

## 2017-12-30 DIAGNOSIS — I130 Hypertensive heart and chronic kidney disease with heart failure and stage 1 through stage 4 chronic kidney disease, or unspecified chronic kidney disease: Secondary | ICD-10-CM | POA: Diagnosis present

## 2017-12-30 DIAGNOSIS — F0390 Unspecified dementia without behavioral disturbance: Secondary | ICD-10-CM | POA: Diagnosis not present

## 2017-12-30 DIAGNOSIS — Z7901 Long term (current) use of anticoagulants: Secondary | ICD-10-CM

## 2017-12-30 DIAGNOSIS — I69311 Memory deficit following cerebral infarction: Secondary | ICD-10-CM

## 2017-12-30 DIAGNOSIS — I69334 Monoplegia of upper limb following cerebral infarction affecting left non-dominant side: Secondary | ICD-10-CM | POA: Diagnosis not present

## 2017-12-30 DIAGNOSIS — Z881 Allergy status to other antibiotic agents status: Secondary | ICD-10-CM

## 2017-12-30 DIAGNOSIS — Z9079 Acquired absence of other genital organ(s): Secondary | ICD-10-CM

## 2017-12-30 DIAGNOSIS — M159 Polyosteoarthritis, unspecified: Secondary | ICD-10-CM | POA: Diagnosis not present

## 2017-12-30 DIAGNOSIS — D649 Anemia, unspecified: Secondary | ICD-10-CM | POA: Diagnosis present

## 2017-12-30 DIAGNOSIS — M48061 Spinal stenosis, lumbar region without neurogenic claudication: Secondary | ICD-10-CM | POA: Diagnosis not present

## 2017-12-30 DIAGNOSIS — Z923 Personal history of irradiation: Secondary | ICD-10-CM | POA: Diagnosis not present

## 2017-12-30 DIAGNOSIS — F0150 Vascular dementia without behavioral disturbance: Secondary | ICD-10-CM | POA: Diagnosis present

## 2017-12-30 DIAGNOSIS — E8809 Other disorders of plasma-protein metabolism, not elsewhere classified: Secondary | ICD-10-CM

## 2017-12-30 DIAGNOSIS — L4050 Arthropathic psoriasis, unspecified: Secondary | ICD-10-CM | POA: Diagnosis not present

## 2017-12-30 DIAGNOSIS — M961 Postlaminectomy syndrome, not elsewhere classified: Secondary | ICD-10-CM | POA: Diagnosis not present

## 2017-12-30 DIAGNOSIS — Z950 Presence of cardiac pacemaker: Secondary | ICD-10-CM | POA: Diagnosis not present

## 2017-12-30 DIAGNOSIS — Z91013 Allergy to seafood: Secondary | ICD-10-CM

## 2017-12-30 DIAGNOSIS — G4733 Obstructive sleep apnea (adult) (pediatric): Secondary | ICD-10-CM | POA: Diagnosis present

## 2017-12-30 DIAGNOSIS — G2581 Restless legs syndrome: Secondary | ICD-10-CM | POA: Diagnosis not present

## 2017-12-30 DIAGNOSIS — I6789 Other cerebrovascular disease: Secondary | ICD-10-CM | POA: Diagnosis not present

## 2017-12-30 DIAGNOSIS — E46 Unspecified protein-calorie malnutrition: Secondary | ICD-10-CM | POA: Diagnosis not present

## 2017-12-30 DIAGNOSIS — Z87891 Personal history of nicotine dependence: Secondary | ICD-10-CM

## 2017-12-30 LAB — COMPREHENSIVE METABOLIC PANEL
ALBUMIN: 2.1 g/dL — AB (ref 3.5–5.0)
ALT: 43 U/L (ref 0–44)
ANION GAP: 14 (ref 5–15)
AST: 33 U/L (ref 15–41)
Alkaline Phosphatase: 103 U/L (ref 38–126)
BUN: 35 mg/dL — AB (ref 8–23)
CALCIUM: 9.1 mg/dL (ref 8.9–10.3)
CO2: 25 mmol/L (ref 22–32)
CREATININE: 1.57 mg/dL — AB (ref 0.61–1.24)
Chloride: 105 mmol/L (ref 98–111)
GFR calc Af Amer: 46 mL/min — ABNORMAL LOW (ref >60)
GFR calc non Af Amer: 39 mL/min — ABNORMAL LOW (ref >60)
GLUCOSE: 116 mg/dL — AB (ref 70–99)
Potassium: 3.6 mmol/L (ref 3.5–5.1)
SODIUM: 144 mmol/L (ref 135–145)
TOTAL PROTEIN: 5.1 g/dL — AB (ref 6.5–8.1)
Total Bilirubin: 0.7 mg/dL (ref 0.3–1.2)

## 2017-12-30 LAB — HEMOGLOBIN A1C
Hgb A1c MFr Bld: 5.8 % — ABNORMAL HIGH (ref 4.8–5.6)
Mean Plasma Glucose: 119.76 mg/dL

## 2017-12-30 LAB — GLUCOSE, CAPILLARY
Glucose-Capillary: 106 mg/dL — ABNORMAL HIGH (ref 70–99)
Glucose-Capillary: 116 mg/dL — ABNORMAL HIGH (ref 70–99)

## 2017-12-30 LAB — CBC
HCT: 33.8 % — ABNORMAL LOW (ref 39.0–52.0)
Hemoglobin: 10.1 g/dL — ABNORMAL LOW (ref 13.0–17.0)
MCH: 27 pg (ref 26.0–34.0)
MCHC: 29.9 g/dL — AB (ref 30.0–36.0)
MCV: 90.4 fL (ref 78.0–100.0)
PLATELETS: 274 10*3/uL (ref 150–400)
RBC: 3.74 MIL/uL — AB (ref 4.22–5.81)
RDW: 16.7 % — ABNORMAL HIGH (ref 11.5–15.5)
WBC: 12.8 10*3/uL — ABNORMAL HIGH (ref 4.0–10.5)

## 2017-12-30 LAB — CORTISOL: CORTISOL PLASMA: 14 ug/dL

## 2017-12-30 LAB — MAGNESIUM: MAGNESIUM: 2.5 mg/dL — AB (ref 1.7–2.4)

## 2017-12-30 MED ORDER — OXYCODONE-ACETAMINOPHEN 5-325 MG PO TABS: 0.5000 | ORAL_TABLET | Freq: Two times a day (BID) | ORAL | 0 refills | Status: DC | PRN

## 2017-12-30 MED ORDER — METOPROLOL SUCCINATE ER 25 MG PO TB24: 25.0000 mg | ORAL_TABLET | Freq: Every day | ORAL | 0 refills | Status: DC

## 2017-12-30 MED ORDER — METOPROLOL SUCCINATE ER 25 MG PO TB24
25.0000 mg | ORAL_TABLET | Freq: Every day | ORAL | Status: DC
  Administered 2017-12-30 – 2018-01-17 (×19): 25 mg via ORAL
  Filled 2017-12-30 (×19): qty 1

## 2017-12-30 MED ORDER — PRO-STAT SUGAR FREE PO LIQD
30.0000 mL | Freq: Three times a day (TID) | ORAL | Status: DC
  Administered 2017-12-31 – 2018-01-02 (×5): 30 mL via ORAL
  Filled 2017-12-30 (×6): qty 30

## 2017-12-30 MED ORDER — TAMSULOSIN HCL 0.4 MG PO CAPS
0.4000 mg | ORAL_CAPSULE | Freq: Every day | ORAL | Status: DC
  Administered 2017-12-30 – 2018-01-18 (×20): 0.4 mg via ORAL
  Filled 2017-12-30 (×20): qty 1

## 2017-12-30 MED ORDER — POTASSIUM CHLORIDE CRYS ER 20 MEQ PO TBCR: 20.0000 meq | EXTENDED_RELEASE_TABLET | Freq: Every day | ORAL | 0 refills | Status: DC

## 2017-12-30 MED ORDER — ACETAMINOPHEN 325 MG PO TABS
650.0000 mg | ORAL_TABLET | Freq: Four times a day (QID) | ORAL | Status: DC
  Administered 2017-12-30 – 2018-01-18 (×76): 650 mg via ORAL
  Filled 2017-12-30 (×76): qty 2

## 2017-12-30 MED ORDER — FERROUS SULFATE 325 (65 FE) MG PO TABS
325.0000 mg | ORAL_TABLET | Freq: Two times a day (BID) | ORAL | Status: DC
  Administered 2017-12-30 – 2018-01-18 (×38): 325 mg via ORAL
  Filled 2017-12-30 (×37): qty 1

## 2017-12-30 MED ORDER — ACETAMINOPHEN 325 MG PO TABS: 650.0000 mg | ORAL_TABLET | Freq: Four times a day (QID) | ORAL | Status: DC

## 2017-12-30 MED ORDER — DULOXETINE HCL 30 MG PO CPEP
30.0000 mg | ORAL_CAPSULE | Freq: Every day | ORAL | Status: DC
  Administered 2017-12-30 – 2018-01-17 (×19): 30 mg via ORAL
  Filled 2017-12-30 (×18): qty 1

## 2017-12-30 MED ORDER — ATORVASTATIN CALCIUM 20 MG PO TABS
20.0000 mg | ORAL_TABLET | Freq: Every day | ORAL | Status: DC
  Administered 2017-12-31 – 2018-01-18 (×19): 20 mg via ORAL
  Filled 2017-12-30 (×19): qty 1

## 2017-12-30 MED ORDER — PANTOPRAZOLE SODIUM 40 MG PO TBEC
40.0000 mg | DELAYED_RELEASE_TABLET | Freq: Every day | ORAL | Status: DC
  Administered 2017-12-30 – 2018-01-17 (×19): 40 mg via ORAL
  Filled 2017-12-30 (×19): qty 1

## 2017-12-30 MED ORDER — APIXABAN 2.5 MG PO TABS: 2.5000 mg | ORAL_TABLET | Freq: Two times a day (BID) | ORAL | 0 refills | Status: DC

## 2017-12-30 MED ORDER — FERROUS SULFATE 325 (65 FE) MG PO TABS: 325.0000 mg | ORAL_TABLET | Freq: Every day | ORAL | 0 refills | Status: DC

## 2017-12-30 MED ORDER — DULOXETINE HCL 30 MG PO CPEP: 30.0000 mg | ORAL_CAPSULE | Freq: Every day | ORAL | 0 refills | Status: DC

## 2017-12-30 MED ORDER — APIXABAN 2.5 MG PO TABS
2.5000 mg | ORAL_TABLET | Freq: Two times a day (BID) | ORAL | Status: DC
  Administered 2017-12-30 – 2018-01-02 (×6): 2.5 mg via ORAL
  Filled 2017-12-30 (×6): qty 1

## 2017-12-30 MED ORDER — GERHARDT'S BUTT CREAM
TOPICAL_CREAM | CUTANEOUS | Status: DC | PRN
Start: 2017-12-30 — End: 2018-01-02
  Administered 2018-01-01: 06:00:00 via TOPICAL
  Filled 2017-12-30: qty 1

## 2017-12-30 MED ORDER — FOLIC ACID 1 MG PO TABS
1.0000 mg | ORAL_TABLET | Freq: Every day | ORAL | Status: DC
  Administered 2017-12-30 – 2018-01-17 (×19): 1 mg via ORAL
  Filled 2017-12-30 (×19): qty 1

## 2017-12-30 MED ORDER — FUROSEMIDE 40 MG PO TABS: 40.0000 mg | ORAL_TABLET | Freq: Every day | ORAL | 0 refills | Status: DC

## 2017-12-30 MED ORDER — FUROSEMIDE 40 MG PO TABS
40.0000 mg | ORAL_TABLET | Freq: Every day | ORAL | Status: DC
  Administered 2017-12-31 – 2018-01-17 (×18): 40 mg via ORAL
  Filled 2017-12-30 (×19): qty 1

## 2017-12-30 MED ORDER — LIDOCAINE 5 % EX PTCH
1.0000 | MEDICATED_PATCH | CUTANEOUS | Status: DC
  Administered 2017-12-31 – 2018-01-01 (×2): 1 via TRANSDERMAL
  Filled 2017-12-30 (×3): qty 1

## 2017-12-30 MED ORDER — POTASSIUM CHLORIDE CRYS ER 20 MEQ PO TBCR: 40.0000 meq | EXTENDED_RELEASE_TABLET | Freq: Once | ORAL | Status: DC

## 2017-12-30 MED ORDER — DONEPEZIL HCL 10 MG PO TABS
10.0000 mg | ORAL_TABLET | Freq: Every day | ORAL | Status: DC
  Administered 2017-12-30 – 2018-01-17 (×19): 10 mg via ORAL
  Filled 2017-12-30 (×19): qty 1

## 2017-12-30 NOTE — Consult Note (Signed)
   [facility] CM Inpatient Consult   12/30/2017  Christopher Baker 02-Jul-1933 [id number]  Late entry for 12/29/17 1350:   Patient was assessed for [facility] for community services. Patient was previously active with [facility].   Spoke with inpatient rehab admission coordinator, Pamala Hurry and inpatient RNCM, Levada Dy regarding patient's disposition.   Awaiting decision for approval for inpatient rehab from insurance company.  Will follow up for disposition and needs. Of note, [facility] Care Management services does not replace or interfere with any services that are arranged by inpatient case management or social work. For additional questions or referrals please contact:  Natividad Brood, RN BSN [address] [facility] Liaison  [phone] business mobile phone Toll free office [phone]

## 2017-12-30 NOTE — Progress Notes (Signed)
Blood pressure 121/62, pulse 73, temperature 98.3 F (36.8 C), temperature source Oral, resp. rate 20, height 6\' 3"  (1.905 m), weight 94.5 kg (208 lb 5.4 oz), SpO2 95 %. CCMD reports patient 4 beat then 8 beat run of V Tach, currently having PVC as previously noted,  No pain per patient, provider paged to make aware before discharge to in patient rehab.  Orders given okay to send to [place].

## 2017-12-30 NOTE — Progress Notes (Signed)
[name] to be D/C'd Rehab per MD order.  Discussed with the patient and all questions fully answered.  An After Visit Summary was printed and given to the patient.   Report given to [place]. Transported via [employer]. Betha Loa Janisa Labus 12/30/2017 3:38 PM

## 2017-12-30 NOTE — H&P (Signed)
Physical Medicine and Rehabilitation Admission H&P    Chief Complaint  Patient presents with  . Urinary Tract Infection  : HPI: Christopher Baker is a 84 year old right-handed male with history of prostate cancer status post radiation, psoriatic arthritis maintained on methotrexate and chronic prednisone, PAF/pacemaker maintained on Eliquis as well as Tikosyn, prior CVA, chronic systolic congestive heart failure with ejection fraction 25%, mild dementia maintained on Aricept, hypertension as well as borderline diabetes mellitus.  Per chart review patient lives with his wife.  Used a rolling walker prior to admission.  Reports patient had been essentially bedbound for the past week but was able to stand and complete a stand pivot transfer.  Presented 12/23/2017 with altered mental status.  In the ED noted fever as well as heart rate in the 150s.  Placed on broad-spectrum antibiotics for suspected urosepsis.  Chest x-ray showed some vascular congestion probable mild pulmonary edema, troponin 1.55, urine culture greater than 100,000 E. coli, lactic acid 2.6, creatinine 1.37.  Patient developed V. tach arrest later that evening followed by cardiology services.  Patient did receive CPR.  Maintained on lidocaine drip from 7/6 to 12/27/2017.  Echocardiogram ejection fraction 30% diffuse hypokinesis.  Renal ultrasound no hydronephrosis.  No further work-up indicated per cardiology services.  CT of the abdomen with no acute findings.  Patient completed 7-day course of Rocephin 12/29/2017 for treatment of bacteremia/urosepsis per ID recommendation.  Follow-up blood cultures no growth.  Noted stage I pressure injury presented on admission with routine skin care.  Physical and occupational therapy evaluations completed with recommendations of physical medicine rehab consult. This morning patient received oxycodone for low back pain and developed dizziness/nausea/difficulty staying awake. Wife states she has used 1/2 to  1/4 tab at home before for severe pain only. Patient was admitted for a comprehensive rehab program.  Review of Systems  Constitutional: Positive for fever.  HENT: Negative for hearing loss.   Eyes: Negative for blurred vision and double vision.  Respiratory: Positive for shortness of breath.   Cardiovascular: Positive for leg swelling.  Gastrointestinal: Positive for constipation. Negative for nausea.  Musculoskeletal: Positive for joint pain.  Skin: Negative for rash.  Psychiatric/Behavioral: Positive for memory loss.  All other systems reviewed and are negative.  Past Medical History:  Diagnosis Date  . Anxiety   . Arthritis    "hands, right hip; lower back" (02/28/2017)  . Arthritis with psoriasis ([place])   . Borderline diabetes    DIET CONTROLLED - PT STATES HE IS NOT DIABETIC  . Carotid stenosis, bilateral PER DR EARLY / DUPLEX  09-09-10     40 - 59%   BILATERALLY--- ASYMPTOMATIC  . Chronic lower back pain   . Depression   . Dysrhythmia    a-fib  . GERD (gastroesophageal reflux disease)    CONTROLLED W/ PROTONIX  . History of gout   . HTN (hypertension)   . Hyperlipidemia   . Iron deficiency   . Lumbar spondylosis W/ RADICULOPATHY  . OSA on CPAP   . PAF (paroxysmal atrial fibrillation) ([place])   . Presence of permanent cardiac pacemaker DDD-  06-04-10-   DDD; SECONDARY TO SYNCOPY AND BRADYCARDIA  . Prostate cancer ([place]) 2008   S/P 59 RADIATION  [place]    . Restless leg syndrome   . Stroke [facility]) Oct. 14, 2014   light left hand, balance issues, short term memory loss 2014  . SVT (supraventricular tachycardia) ([place])    S/P ABLATION   2008  Past Surgical History:  Procedure Laterality Date  . BACK SURGERY    . BIV UPGRADE N/A 09/07/2017   Procedure: BIV PACEMAKER UPGRADE;  Surgeon: Evans Lance, MD;  Location: [facility];  Service: Cardiovascular;  Laterality: N/A;  . BREAST SURGERY     "removed tumor; don't remember which side; years ago" (02/28/2017)  .  CARDIAC ELECTROPHYSIOLOGY STUDY AND ABLATION  2008   FOR SVT  . CARDIAC PACEMAKER PLACEMENT  06-04-10   DDD  . CARDIOVERSION N/A 03/02/2017   Procedure: CARDIOVERSION;  Surgeon: Josue Hector, MD;  Location: [facility] ENDOSCOPY;  Service: Cardiovascular;  Laterality: N/A;  . CATARACT EXTRACTION W/ INTRAOCULAR LENS  IMPLANT, BILATERAL Bilateral   . CHOLECYSTECTOMY  2009  . ESOPHAGOGASTRODUODENOSCOPY N/A 12/12/2014   Procedure: ESOPHAGOGASTRODUODENOSCOPY (EGD);  Surgeon: Wilford Corner, MD;  Location: [facility];  Service: Endoscopy;  Laterality: N/A;  . INCISION AND DRAINAGE Right 1997   "knee replacement got infected"  . INGUINAL HERNIA REPAIR Left 06/2003    DONE WITH PENILE PROSTESIS SURG.  . INGUINAL HERNIA REPAIR Right 1962  . JOINT REPLACEMENT    . KNEE ARTHROSCOPY  06/02/2011   Procedure: ARTHROSCOPY KNEE;  Surgeon: Gearlean Alf;  Location: [facility];  Service: Orthopedics;  Laterality: Left;  LEFT KNEE ARTHROSCOPY WITH DEBRIDEMENT  . LOOP RECORDER PLACEMENT  01-30-10  . LOOP RECORDER REMOVAL  06/04/2010  . LUMBAR LAMINECTOMY/ DISKECTOMY/ FUSION  05-19-10   L4 - 5  . PENILE PROSTHESIS IMPLANT  06/2003   AND LEFT CORPOROPLASTY /    DONE INGUINAL REPAIR  . PROSTATE BIOPSY  2007  . REVISION TOTAL KNEE ARTHROPLASTY Right 1997  . TOTAL KNEE ARTHROPLASTY  12/20/2011   Procedure: TOTAL KNEE ARTHROPLASTY;  Surgeon: Gearlean Alf, MD;  Location: [facility];  Service: Orthopedics;  Laterality: Left;  . TOTAL KNEE ARTHROPLASTY Right 1997  . TOTAL SHOULDER ARTHROPLASTY Right 06/27/2015   Procedure: REVERSE TOTAL SHOULDER ARTHROPLASTY;  Surgeon: Netta Cedars, MD;  Location: [facility];  Service: Orthopedics;  Laterality: Right;  . TRANSURETHRAL RESECTION OF BLADDER TUMOR WITH GYRUS (TURBT-GYRUS)  ?2018  . TRANSURETHRAL RESECTION OF PROSTATE  2006   Family History  Problem Relation Age of Onset  . Stroke Mother   . Early death Father    Social History:  reports that he quit  smoking about 19 years ago. His smoking use included cigarettes. He has a 45.00 pack-year smoking history. He has never used smokeless tobacco. He reports that he drinks alcohol. He reports that he does not use drugs. Allergies:  Allergies  Allergen Reactions  . Ciprofloxacin Nausea Only and Other (See Comments)    Stomach ache  . Hydrocodone-Acetaminophen Other (See Comments)    Hallucinations in higher doses  . Other Other (See Comments)    Rash from neoprene wrap after knee surgery  . Shellfish Allergy Nausea And Vomiting and Other (See Comments)    Reaction to scallops  . Tramadol Other (See Comments)    Pt becomes combative per wife   Medications Prior to Admission  Medication Sig Dispense Refill  . acetaminophen (TYLENOL) 500 MG tablet Take 1,000 mg by mouth 2 (two) times daily.    Marland Kitchen amoxicillin (AMOXIL) 500 MG capsule Take 2,000 mg by mouth See admin instructions. Take 2000 mg by mouth one hour prior to dental apointments    . Artificial Tear Ointment (LUBRICANT EYE OP) Place 2 drops into both eyes daily as needed (for dry eyes).    Marland Kitchen  aspirin 325 MG tablet Take 325 mg by mouth once.    Marland Kitchen atorvastatin (LIPITOR) 20 MG tablet Take 20 mg by mouth daily.    . cyanocobalamin (,VITAMIN B-12,) 1000 MCG/ML injection Inject 1,000 mcg into the muscle every 30 (thirty) days.     Marland Kitchen dofetilide (TIKOSYN) 500 MCG capsule Take 1 capsule (500 mcg total) by mouth 2 (two) times daily. (Patient taking differently: Take 500 mcg by mouth every 12 (twelve) hours. Takes between 10:00 and 11:00 in the AM & PM) 180 capsule 3  . donepezil (ARICEPT) 10 MG tablet TAKE ONE TABLET BY MOUTH EVERY NIGHT AT BEDTIME 90 tablet 1  . DULoxetine (CYMBALTA) 30 MG capsule Take 1 capsule (30 mg total) by mouth daily. (Patient taking differently: Take 30 mg by mouth at bedtime. ) 30 capsule 0  . ELIQUIS 5 MG TABS tablet TAKE 1 TABLET (5 MG TOTAL) BY MOUTH 2 (TWO) TIMES DAILY. 60 tablet 8  . ezetimibe (ZETIA) 10 MG tablet Take  1 tablet (10 mg total) by mouth daily. 90 tablet 1  . ferrous sulfate 325 (65 FE) MG tablet Take 325 mg by mouth 2 (two) times daily with a meal.     . folic acid (FOLVITE) 1 MG tablet Take 1 mg by mouth at bedtime.     . furosemide (LASIX) 40 MG tablet Take 80 mg by mouth daily.     Marland Kitchen lisinopril (PRINIVIL,ZESTRIL) 5 MG tablet TAKE ONE TABLET BY MOUTH DAILY (Patient taking differently: TAKE ONE TABLET BY MOUTH in the evening) 90 tablet 2  . loperamide (IMODIUM A-D) 2 MG tablet Take 2 mg by mouth 3 (three) times daily as needed for diarrhea or loose stools.     . methotrexate (RHEUMATREX) 2.5 MG tablet Take 15 mg by mouth every Monday.     . metoprolol succinate (TOPROL XL) 25 MG 24 hr tablet Take 1 tablet (25 mg total) by mouth daily. (Patient taking differently: Take 25 mg by mouth at bedtime. ) 90 tablet 2  . oxyCODONE-acetaminophen (PERCOCET) 10-325 MG tablet Take 0.25-0.5 tablets by mouth 2 (two) times daily as needed for pain.     . pantoprazole (PROTONIX) 40 MG tablet Take 1 tablet (40 mg total) by mouth daily. (Patient taking differently: Take 40 mg by mouth at bedtime. ) 90 tablet 3  . predniSONE (DELTASONE) 5 MG tablet Take 5 mg by mouth daily.    . tamsulosin (FLOMAX) 0.4 MG CAPS capsule Take 0.4 mg by mouth daily.    . Vitamin D, Ergocalciferol, (DRISDOL) 50000 units CAPS capsule Take 50,000 Units by mouth every Sunday.     . DULoxetine (CYMBALTA) 30 MG capsule Take 1 capsule (30 mg total) by mouth daily. (Patient not taking: Reported on 12/23/2017) 30 capsule 5    Drug Regimen Review Drug regimen was reviewed and remains appropriate with no significant issues identified  Home: Home Living Family/patient expects to be discharged to:: Private residence Living Arrangements: Spouse/significant other Available Help at Discharge: Family, Available 24 hours/day Type of Home: House Home Access: Stairs to enter [employer] of Steps: 3 Entrance Stairs-Rails: Right, Left Home  Layout: Able to live on main level with bedroom/bathroom Bathroom Shower/Tub: Multimedia programmer: Standard Bathroom Accessibility: Yes Home Equipment: Environmental consultant - 4 wheels   Functional History: Prior Function Level of Independence: Independent with assistive device(s) Comments: amb with rollator . Performed ADLs independently  Functional Status:  Mobility: Bed Mobility Overal bed mobility: Needs Assistance Bed Mobility: Supine to Sit  Supine to sit: Max assist, HOB elevated General bed mobility comments: cues for sequencing and assist to scoot hips and elevate trunk into sitting Transfers Overall transfer level: Needs assistance Equipment used: Rolling walker (2 wheeled) Transfers: Sit to/from Stand, [employer] Transfers Sit to Stand: +2 physical assistance, Min assist, Mod assist Stand pivot transfers: Mod assist, +2 physical assistance Squat pivot transfers: Mod assist, +2 physical assistance General transfer comment: pt stood from EOB and recliner; cues for safe hand placement and sequencing; min A +2 to power up into standing and then mod A to gain balance and upright posture upon standing  Ambulation/Gait Ambulation/Gait assistance: Mod assist, +2 safety/equipment(chair follow) Gait Distance (Feet): 6 Feet Assistive device: Rolling walker (2 wheeled) Gait Pattern/deviations: Step-through pattern, Decreased step length - right, Decreased step length - left, Shuffle, Trunk flexed General Gait Details: multimodal cues for posture; assist for balance and guidance of RW  Gait velocity: decreased    ADL: ADL Overall ADL's : Needs assistance/impaired Eating/Feeding: Set up, Supervision/ safety, Sitting Grooming: Wash/dry face, Set up, Supervision/safety, Sitting Grooming Details (indicate cue type and reason): Washing face awhile seated in recliner. Requiring cues for initating and increase occupational participation Upper Body Bathing: Moderate assistance,  Sitting Lower Body Bathing: Maximal assistance, Sit to/from stand Upper Body Dressing : Moderate assistance, Sitting Lower Body Dressing: Maximal assistance, Sit to/from stand Lower Body Dressing Details (indicate cue type and reason): Max A to don socks at [employer]: Moderate assistance, Stand-pivot(two person; simulated to recliner) Toilet Transfer Details (indicate cue type and reason): Mod A +2 for transition to left Toileting- Clothing Manipulation and Hygiene: Maximal assistance, +2 for safety/equipment, Bed level Toileting - Clothing Manipulation Details (indicate cue type and reason): Mod A for rolling and then Max A for toilet hygiene with BM in bed Functional mobility during ADLs: Moderate assistance, +2 for physical assistance, +2 for safety/equipment(stand pivot only) General ADL Comments: Pt with decreased balance, strength, and cognition  Cognition: Cognition Overall Cognitive Status: History of cognitive impairments - at baseline Orientation Level: Oriented to person, Oriented to place, Disoriented to time, Disoriented to situation Cognition Arousal/Alertness: Awake/alert Behavior During Therapy: WFL for tasks assessed/performed Overall Cognitive Status: History of cognitive impairments - at baseline General Comments: history of dementia and memory deficits. Following simple cues  Physical Exam: Blood pressure 133/71, pulse 73, temperature 97.7 F (36.5 C), resp. rate 18, height 6' 3" (1.905 m), weight 94.5 kg (208 lb 5.4 oz), SpO2 95 %. Physical Exam  Vitals reviewed. Constitutional: No distress.  HENT:  Head: Normocephalic and atraumatic.  Eyes: Pupils are equal, round, and reactive to light. EOM are normal.  Neck: Normal range of motion. Neck supple.  Cardiovascular: Normal rate.  Murmur heard. Respiratory: Effort normal. No respiratory distress. He has no wheezes. He has no rales.  GI: Soft. He exhibits no distension. There is no tenderness.   Neurological: A cranial nerve deficit is present.  Patient provides his name and age.  More lethargic today, slow to arouse.    Follows simple commands. UE 2-3/5 prox to distal. LE: 2 /5 prox to 3/5 distally. Senses pai nin all 4's.   Psychiatric:  Flat, sedated appearing    Results for orders placed or performed during the hospital encounter of 12/23/17 (from the past 48 hour(s))  Glucose, capillary     Status: Abnormal   Collection Time: 12/28/17  7:44 AM  Result Value Ref Range   Glucose-Capillary 121 (H) 70 - 99 mg/dL   Comment  1 Capillary Specimen   Glucose, capillary     Status: Abnormal   Collection Time: 12/28/17 11:44 AM  Result Value Ref Range   Glucose-Capillary 116 (H) 70 - 99 mg/dL   Comment 1 Capillary Specimen   Glucose, capillary     Status: Abnormal   Collection Time: 12/28/17  4:09 PM  Result Value Ref Range   Glucose-Capillary 106 (H) 70 - 99 mg/dL   Comment 1 Capillary Specimen   Basic metabolic panel     Status: Abnormal   Collection Time: 12/29/17  7:48 AM  Result Value Ref Range   Sodium 143 135 - 145 mmol/L   Potassium 3.5 3.5 - 5.1 mmol/L   Chloride 106 98 - 111 mmol/L    Comment: Please note change in reference range.   CO2 27 22 - 32 mmol/L   Glucose, Bld 121 (H) 70 - 99 mg/dL    Comment: Please note change in reference range.   BUN 40 (H) 8 - 23 mg/dL    Comment: Please note change in reference range.   Creatinine, Ser 1.62 (H) 0.61 - 1.24 mg/dL   Calcium 9.2 8.9 - 10.3 mg/dL   GFR calc non Af Amer 38 (L) >60 mL/min   GFR calc Af Amer 44 (L) >60 mL/min    Comment: (NOTE) The eGFR has been calculated using the CKD EPI equation. This calculation has not been validated in all clinical situations. eGFR's persistently <60 mL/min signify possible Chronic Kidney Disease.    Anion gap 10 5 - 15    Comment: Performed at [facility] [address] [place], [place] [address]  Glucose, capillary     Status: Abnormal   Collection Time: 12/29/17  12:26 PM  Result Value Ref Range   Glucose-Capillary 129 (H) 70 - 99 mg/dL  Glucose, capillary     Status: Abnormal   Collection Time: 12/29/17  4:59 PM  Result Value Ref Range   Glucose-Capillary 122 (H) 70 - 99 mg/dL  Glucose, capillary     Status: Abnormal   Collection Time: 12/29/17  5:05 PM  Result Value Ref Range   Glucose-Capillary 114 (H) 70 - 99 mg/dL  Glucose, capillary     Status: Abnormal   Collection Time: 12/29/17  8:51 PM  Result Value Ref Range   Glucose-Capillary 137 (H) 70 - 99 mg/dL  Cortisol     Status: None   Collection Time: 12/30/17  4:24 AM  Result Value Ref Range   Cortisol, Plasma 14.0 ug/dL    Comment: (NOTE) AM    6.7 - 22.6 ug/dL PM   <10.0       ug/dL Performed at [facility], [place] [address] [place], [place] [address]   CBC     Status: Abnormal   Collection Time: 12/30/17  4:24 AM  Result Value Ref Range   WBC 12.8 (H) 4.0 - 10.5 K/uL   RBC 3.74 (L) 4.22 - 5.81 MIL/uL   Hemoglobin 10.1 (L) 13.0 - 17.0 g/dL   HCT 33.8 (L) 39.0 - 52.0 %   MCV 90.4 78.0 - 100.0 fL   MCH 27.0 26.0 - 34.0 pg   MCHC 29.9 (L) 30.0 - 36.0 g/dL   RDW 16.7 (H) 11.5 - 15.5 %   Platelets 274 150 - 400 K/uL    Comment: Performed at [facility], [place] [address] [place], [place] [address]  Comprehensive metabolic panel     Status: Abnormal   Collection  Time: 12/30/17  4:24 AM  Result Value Ref Range   Sodium 144 135 - 145 mmol/L   Potassium 3.6 3.5 - 5.1 mmol/L   Chloride 105 98 - 111 mmol/L    Comment: Please note change in reference range.   CO2 25 22 - 32 mmol/L   Glucose, Bld 116 (H) 70 - 99 mg/dL    Comment: Please note change in reference range.   BUN 35 (H) 8 - 23 mg/dL    Comment: Please note change in reference range.   Creatinine, Ser 1.57 (H) 0.61 - 1.24 mg/dL   Calcium 9.1 8.9 - 10.3 mg/dL   Total Protein 5.1 (L) 6.5 - 8.1 g/dL   Albumin 2.1 (L) 3.5 - 5.0 g/dL   AST 33 15 - 41 U/L   ALT 43 0 - 44 U/L    Comment: Please note change in  reference range.   Alkaline Phosphatase 103 38 - 126 U/L   Total Bilirubin 0.7 0.3 - 1.2 mg/dL   GFR calc non Af Amer 39 (L) >60 mL/min   GFR calc Af Amer 46 (L) >60 mL/min    Comment: (NOTE) The eGFR has been calculated using the CKD EPI equation. This calculation has not been validated in all clinical situations. eGFR's persistently <60 mL/min signify possible Chronic Kidney Disease.    Anion gap 14 5 - 15    Comment: Performed at [facility] [address], [place], [place] [address]  Hemoglobin A1c     Status: Abnormal   Collection Time: 12/30/17  4:24 AM  Result Value Ref Range   Hgb A1c MFr Bld 5.8 (H) 4.8 - 5.6 %    Comment: (NOTE) Pre diabetes:          5.7%-6.4% Diabetes:              >6.4% Glycemic control for   <7.0% adults with diabetes    Mean Plasma Glucose 119.76 mg/dL    Comment: Performed at [facility] [address] [place], [place] [address]  Magnesium     Status: Abnormal   Collection Time: 12/30/17  4:24 AM  Result Value Ref Range   Magnesium 2.5 (H) 1.7 - 2.4 mg/dL    Comment: Performed at [facility] [address] [place], [place] [address]   Dg Chest Port 1 View  Result Date: 12/28/2017 CLINICAL DATA:  Short of breath, respiratory failure EXAM: PORTABLE CHEST 1 VIEW COMPARISON:  12/27/2017 FINDINGS: LEFT-sided pacemaker overlies normal cardiac silhouette. Bilateral small effusions. Mild interstitial edema pattern. No pneumothorax. No focal consolidation. IMPRESSION: Bilateral pleural effusions and mild interstitial edema Electronically Signed   By: Suzy Bouchard M.D.   On: 12/28/2017 08:47       Medical Problem List and Plan: 1.  Decreased functional mobility secondary to encephalopathy due to urosepsis/multi-medical  -admit to inpatient rehab 2.  DVT Prophylaxis/Anticoagulation: Eliquis.  Monitor for any bleeding episodes 3. Pain Management: Lidoderm patch  -avoid oxycodone due to oversedation  -schedule tylenol  658m qid  -heat/ice prn  -consider low dose tramadol for severe pain 4. Mood: Aricept 10 mg nightly, Cymbalta 30 mg nightly 5. Neuropsych: This patient is capable of making decisions on his own behalf. 6. Skin/Wound Care: Stage I sacral pressure injury.  Routine skin checks 7. Fluids/Electrolytes/Nutrition: Routine in and outs with follow-up chemistries 8.  ID.  IV Rocephin completed 12/29/2017. 9.  V. tach cardiac arrest with history of PAF/pacemaker.  Patient currently  off Tikosyn until follow-up outpatient with cardiology services. 10.  Chronic systolic congestive heart failure.  Monitor for any signs of fluid overload.  Lasix 40 mg daily  -will change this to nights given bladder issues 11.  Psoriatic arthritis.  Methotrexate and chronic prednisone currently on hold and Question Resume 12.  Hypertension.  Toprol-XL 25 mg daily.  Monitor with increased mobility 13.  Hyperlipidemia.  Lipitor 14.  CKD stage III.  Follow-up chemistries 15.  History of prostate cancer status post radiation.  Patient on Flomax prior to admission 0.4 mg daily.  Renal ultrasound no hydronephrosis.  Follow-up outpatient Dr. Amalia Hailey  -will use condom catheter at nights for continence and urinary frequency which has affected sleep previously 16.  Acute on chronic anemia.  Continue iron supplement.  Follow-up CBC  Post Admission Physician Evaluation: 1. Functional deficits secondary  to debility/encephalopathy. 2. Patient is admitted to receive collaborative, interdisciplinary care between the physiatrist, rehab nursing staff, and therapy team. 3. Patient's level of medical complexity and substantial therapy needs in context of that medical necessity cannot be provided at a lesser intensity of care such as a SNF. 4. Patient has experienced substantial functional loss from his/her baseline which was documented above under the "Functional History" and "Functional Status" headings.  Judging by the patient's diagnosis,  physical exam, and functional history, the patient has potential for functional progress which will result in measurable gains while on inpatient rehab.  These gains will be of substantial and practical use upon discharge  in facilitating mobility and self-care at the household level. 5. Physiatrist will provide 24 hour management of medical needs as well as oversight of the therapy plan/treatment and provide guidance as appropriate regarding the interaction of the two. 6. The Preadmission Screening has been reviewed and patient status is unchanged unless otherwise stated above. 7. 24 hour rehab nursing will assist with bladder management, bowel management, safety, skin/wound care, disease management, medication administration, pain management and patient education  and help integrate therapy concepts, techniques,education, etc. 8. PT will assess and treat for/with: Lower extremity strength, range of motion, stamina, balance, functional mobility, safety, adaptive techniques and equipment, NMR, family education, pain control.   Goals are: mod I to supervision. 9. OT will assess and treat for/with: ADL's, functional mobility, safety, upper extremity strength, adaptive techniques and equipment, NMR, family ed.   Goals are: mod I to supervision. Therapy may proceed with showering this patient. 10. SLP will assess and treat for/with: cognition and communication.  Goals are: mod I. 11. Case Management and Social Worker will assess and treat for psychological issues and discharge planning. 12. Team conference will be held weekly to assess progress toward goals and to determine barriers to discharge. 13. Patient will receive at least 3 hours of therapy per day at least 5 days per week. 14. ELOS: 13-18 days       15. Prognosis:  excellent    I have personally performed a face to face diagnostic evaluation of this patient and formulated the key components of the plan.  Additionally, I have personally reviewed  laboratory data, imaging studies, as well as relevant notes and concur with the physician assistant's documentation above.  [name], MD, FAAPMR     Lavon Paganini [place], PA-C 12/30/2017

## 2017-12-30 NOTE — Progress Notes (Signed)
   12/30/17 1150  Orthostatic Lying   BP- Lying 116/68    12/30/17 1150  Orthostatic Sitting  BP- Sitting 96/68  Patient symptomatic with nausea and dizziness, provider text paged to make aware.

## 2017-12-30 NOTE — Progress Notes (Signed)
Inpatient Rehabilitation Admissions Coordinator  I have insurance approval to admit pt to inpt rehab today. I met with patient, wife and daughter at bedside. They are in agreement to admit. I will make the arrangements to admit today. I contacted Dr. Erlinda Hong, RN CM and SW as well as bedside RN  Danne Baxter, RN, MSN Rehab Admissions Coordinator [phone] 12/30/2017 10:48 AM

## 2017-12-30 NOTE — Progress Notes (Signed)
Patient admitting to CIR. CSW signing off as no further needs.  Percell Locus Debraann Livingstone LCSW [phone]

## 2017-12-30 NOTE — Progress Notes (Signed)
Physical Medicine and Rehabilitation Consult Reason for Consult: Decreased functional mobility Referring Physician: Family medicine   HPI: Christopher Baker is a 84 y.o. right-handed male with history of PAF/pacemaker maintained on Eliquis, prior CVA, chronic systolic congestive heart failure with EF of 25%, mild dementia maintained on Aricept, hypertension as well as borderline diabetes mellitus.  Per chart review patient lives with wife.  Used a rolling walker prior to admission.  Reports patient had been essentially bedbound for the past week he was able to stand and complete a stand pivot transfer.  Presented 12/23/2017 with altered mental status.  In the ED noted fever as well as heart rate in the 150s.  Placed on broad-spectrum antibiotics for suspected urosepsis.  Chest x-ray showed some vascular congestion probable mild pulmonary edema, troponin 1.55, urine culture 100,000 E. coli, lactic acid 2.6, creatinine 1.37.  Patient developed V. tach arrest later that evening follow-up per cardiology services.  Patient did receive CPR.  Echocardiogram 12/25/2017 ejection fraction of 30% diffuse hypokinesis.  Renal ultrasound with no hydronephrosis.  CT of the abdomen with no acute findings.  Therapy evaluations completed with recommendations of physical medicine rehab consult.   Review of Systems  Constitutional: Positive for fever.  HENT: Negative for hearing loss.   Eyes: Negative for blurred vision and double vision.  Respiratory: Positive for shortness of breath.   Cardiovascular: Positive for leg swelling.  Gastrointestinal: Positive for constipation. Negative for nausea and vomiting.  Genitourinary: Negative for dysuria, flank pain and hematuria.  Musculoskeletal: Positive for joint pain and myalgias.  Skin: Negative for rash.  Neurological: Negative for seizures.  Psychiatric/Behavioral: Positive for memory loss.       Anxiety  All other systems reviewed and are negative.        Past Medical History:  Diagnosis Date  . Anxiety   . Arthritis    "hands, right hip; lower back" (02/28/2017)  . Arthritis with psoriasis ([place])   . Borderline diabetes    DIET CONTROLLED - PT STATES HE IS NOT DIABETIC  . Carotid stenosis, bilateral PER DR EARLY / DUPLEX  09-09-10     40 - 59%   BILATERALLY--- ASYMPTOMATIC  . Chronic lower back pain   . Depression   . Dysrhythmia    a-fib  . GERD (gastroesophageal reflux disease)    CONTROLLED W/ PROTONIX  . History of gout   . HTN (hypertension)   . Hyperlipidemia   . Iron deficiency   . Lumbar spondylosis W/ RADICULOPATHY  . OSA on CPAP   . PAF (paroxysmal atrial fibrillation) ([place])   . Presence of permanent cardiac pacemaker DDD-  06-04-10-   DDD; SECONDARY TO SYNCOPY AND BRADYCARDIA  . Prostate cancer ([place]) 2008   S/P 101 RADIATION  [place]    . Restless leg syndrome   . Stroke [facility]) Oct. 14, 2014   light left hand, balance issues, short term memory loss 2014  . SVT (supraventricular tachycardia) ([place])    S/P ABLATION   2008        Past Surgical History:  Procedure Laterality Date  . BACK SURGERY    . BIV UPGRADE N/A 09/07/2017   Procedure: BIV PACEMAKER UPGRADE;  Surgeon: Evans Lance, MD;  Location: [facility];  Service: Cardiovascular;  Laterality: N/A;  . BREAST SURGERY     "removed tumor; don't remember which side; years ago" (02/28/2017)  . CARDIAC ELECTROPHYSIOLOGY STUDY AND ABLATION  2008   FOR SVT  .  CARDIAC PACEMAKER PLACEMENT  06-04-10   DDD  . CARDIOVERSION N/A 03/02/2017   Procedure: CARDIOVERSION;  Surgeon: Josue Hector, MD;  Location: [facility] ENDOSCOPY;  Service: Cardiovascular;  Laterality: N/A;  . CATARACT EXTRACTION W/ INTRAOCULAR LENS  IMPLANT, BILATERAL Bilateral   . CHOLECYSTECTOMY  2009  . ESOPHAGOGASTRODUODENOSCOPY N/A 12/12/2014   Procedure: ESOPHAGOGASTRODUODENOSCOPY (EGD);  Surgeon: Wilford Corner, MD;  Location[facility] ENDOSCOPY;  Service: Endoscopy;   Laterality: N/A;  . INCISION AND DRAINAGE Right 1997   "knee replacement got infected"  . INGUINAL HERNIA REPAIR Left 06/2003    DONE WITH PENILE PROSTESIS SURG.  . INGUINAL HERNIA REPAIR Right 1962  . JOINT REPLACEMENT    . KNEE ARTHROSCOPY  06/02/2011   Procedure: ARTHROSCOPY KNEE;  Surgeon: Gearlean Alf;  Location: [facility];  Service: Orthopedics;  Laterality: Left;  LEFT KNEE ARTHROSCOPY WITH DEBRIDEMENT  . LOOP RECORDER PLACEMENT  01-30-10  . LOOP RECORDER REMOVAL  06/04/2010  . LUMBAR LAMINECTOMY/ DISKECTOMY/ FUSION  05-19-10   L4 - 5  . PENILE PROSTHESIS IMPLANT  06/2003   AND LEFT CORPOROPLASTY /    DONE INGUINAL REPAIR  . PROSTATE BIOPSY  2007  . REVISION TOTAL KNEE ARTHROPLASTY Right 1997  . TOTAL KNEE ARTHROPLASTY  12/20/2011   Procedure: TOTAL KNEE ARTHROPLASTY;  Surgeon: Gearlean Alf, MD;  Location: [facility];  Service: Orthopedics;  Laterality: Left;  . TOTAL KNEE ARTHROPLASTY Right 1997  . TOTAL SHOULDER ARTHROPLASTY Right 06/27/2015   Procedure: REVERSE TOTAL SHOULDER ARTHROPLASTY;  Surgeon: Netta Cedars, MD;  Location: [facility];  Service: Orthopedics;  Laterality: Right;  . TRANSURETHRAL RESECTION OF BLADDER TUMOR WITH GYRUS (TURBT-GYRUS)  ?2018  . TRANSURETHRAL RESECTION OF PROSTATE  2006        Family History  Problem Relation Age of Onset  . Stroke Mother   . Early death Father    Social History:  reports that he quit smoking about 19 years ago. His smoking use included cigarettes. He has a 45.00 pack-year smoking history. He has never used smokeless tobacco. He reports that he drinks alcohol. He reports that he does not use drugs. Allergies:       Allergies  Allergen Reactions  . Ciprofloxacin Nausea Only and Other (See Comments)    Stomach ache  . Hydrocodone-Acetaminophen Other (See Comments)    Hallucinations in higher doses  . Other Other (See Comments)    Rash from neoprene wrap after knee surgery  .  Shellfish Allergy Nausea And Vomiting and Other (See Comments)    Reaction to scallops  . Tramadol Other (See Comments)    Pt becomes combative per wife         Medications Prior to Admission  Medication Sig Dispense Refill  . acetaminophen (TYLENOL) 500 MG tablet Take 1,000 mg by mouth 2 (two) times daily.    Marland Kitchen amoxicillin (AMOXIL) 500 MG capsule Take 2,000 mg by mouth See admin instructions. Take 2000 mg by mouth one hour prior to dental apointments    . Artificial Tear Ointment (LUBRICANT EYE OP) Place 2 drops into both eyes daily as needed (for dry eyes).    Marland Kitchen aspirin 325 MG tablet Take 325 mg by mouth once.    Marland Kitchen atorvastatin (LIPITOR) 20 MG tablet Take 20 mg by mouth daily.    . cyanocobalamin (,VITAMIN B-12,) 1000 MCG/ML injection Inject 1,000 mcg into the muscle every 30 (thirty) days.     Marland Kitchen dofetilide (TIKOSYN) 500 MCG capsule Take 1 capsule (500  mcg total) by mouth 2 (two) times daily. (Patient taking differently: Take 500 mcg by mouth every 12 (twelve) hours. Takes between 10:00 and 11:00 in the AM & PM) 180 capsule 3  . donepezil (ARICEPT) 10 MG tablet TAKE ONE TABLET BY MOUTH EVERY NIGHT AT BEDTIME 90 tablet 1  . DULoxetine (CYMBALTA) 30 MG capsule Take 1 capsule (30 mg total) by mouth daily. (Patient taking differently: Take 30 mg by mouth at bedtime. ) 30 capsule 0  . ELIQUIS 5 MG TABS tablet TAKE 1 TABLET (5 MG TOTAL) BY MOUTH 2 (TWO) TIMES DAILY. 60 tablet 8  . ezetimibe (ZETIA) 10 MG tablet Take 1 tablet (10 mg total) by mouth daily. 90 tablet 1  . ferrous sulfate 325 (65 FE) MG tablet Take 325 mg by mouth 2 (two) times daily with a meal.     . folic acid (FOLVITE) 1 MG tablet Take 1 mg by mouth at bedtime.     . furosemide (LASIX) 40 MG tablet Take 80 mg by mouth daily.     Marland Kitchen lisinopril (PRINIVIL,ZESTRIL) 5 MG tablet TAKE ONE TABLET BY MOUTH DAILY (Patient taking differently: TAKE ONE TABLET BY MOUTH in the evening) 90 tablet 2  . loperamide (IMODIUM  A-D) 2 MG tablet Take 2 mg by mouth 3 (three) times daily as needed for diarrhea or loose stools.     . methotrexate (RHEUMATREX) 2.5 MG tablet Take 15 mg by mouth every Monday.     . metoprolol succinate (TOPROL XL) 25 MG 24 hr tablet Take 1 tablet (25 mg total) by mouth daily. (Patient taking differently: Take 25 mg by mouth at bedtime. ) 90 tablet 2  . oxyCODONE-acetaminophen (PERCOCET) 10-325 MG tablet Take 0.25-0.5 tablets by mouth 2 (two) times daily as needed for pain.     . pantoprazole (PROTONIX) 40 MG tablet Take 1 tablet (40 mg total) by mouth daily. (Patient taking differently: Take 40 mg by mouth at bedtime. ) 90 tablet 3  . predniSONE (DELTASONE) 5 MG tablet Take 5 mg by mouth daily.    . tamsulosin (FLOMAX) 0.4 MG CAPS capsule Take 0.4 mg by mouth daily.    . Vitamin D, Ergocalciferol, (DRISDOL) 50000 units CAPS capsule Take 50,000 Units by mouth every Sunday.     . DULoxetine (CYMBALTA) 30 MG capsule Take 1 capsule (30 mg total) by mouth daily. (Patient not taking: Reported on 12/23/2017) 30 capsule 5    Home: Home Living Family/patient expects to be discharged to:: Private residence Living Arrangements: Spouse/significant other Available Help at Discharge: Family, Available 24 hours/day Type of Home: House Home Access: Stairs to enter [employer] of Steps: 3 Entrance Stairs-Rails: Right, Left Home Layout: Able to live on main level with bedroom/bathroom Bathroom Shower/Tub: Multimedia programmer: Standard Bathroom Accessibility: Yes Home Equipment: Environmental consultant - 4 wheels  Functional History: Prior Function Level of Independence: Independent with assistive device(s) Comments: amb with rollator . Performed ADLs independently Functional Status:  Mobility: Bed Mobility Overal bed mobility: Needs Assistance Bed Mobility: Supine to Sit Supine to sit: Max assist, HOB elevated General bed mobility comments: pt with R flank pain, initiated LE  movement to EOB, due to chest/flank pain, maxA for trunk elevation required Transfers Overall transfer level: Needs assistance Equipment used: 2 person hand held assist Transfers: Sit to/from Stand, Stand Pivot Transfers Sit to Stand: Mod assist, +2 physical assistance Stand pivot transfers: Mod assist, +2 physical assistance Squat pivot transfers: Mod assist, +2 physical assistance General transfer comment:  pt able to push up to erect posture, modA to maintain balance, pt weak in bilat knees with buckling, modAx2 to sequence stepping to the L, pt with decreased R LE WBing due to R hip pain. modA for walker management Ambulation/Gait General Gait Details: took 5 steps to chair during std pvt  with RW  ADL: ADL Overall ADL's : Needs assistance/impaired Eating/Feeding: Set up, Supervision/ safety, Sitting Grooming: Wash/dry face, Set up, Supervision/safety, Sitting Grooming Details (indicate cue type and reason): Washing face awhile seated in recliner. Requiring cues for initating and increase occupational participation Upper Body Bathing: Moderate assistance, Sitting Lower Body Bathing: Maximal assistance, Sit to/from stand Upper Body Dressing : Moderate assistance, Sitting Lower Body Dressing: Maximal assistance, Sit to/from stand Lower Body Dressing Details (indicate cue type and reason): Max A to don socks at [employer]: Moderate assistance, Stand-pivot(two person; simulated to recliner) Toilet Transfer Details (indicate cue type and reason): Mod A +2 for transition to left Toileting- Clothing Manipulation and Hygiene: Maximal assistance, +2 for safety/equipment, Bed level Toileting - Clothing Manipulation Details (indicate cue type and reason): Mod A for rolling and then Max A for toilet hygiene with BM in bed Functional mobility during ADLs: Moderate assistance, +2 for physical assistance, +2 for safety/equipment(stand pivot only) General ADL Comments: Pt with decreased  balance, strength, and cognition  Cognition: Cognition Overall Cognitive Status: Within Functional Limits for tasks assessed Orientation Level: Oriented to person, Oriented to place, Disoriented to time, Appropriate for developmental age, Oriented to situation Cognition Arousal/Alertness: Lethargic(pt had oxycodone prior to treatment) Behavior During Therapy: Flat affect Overall Cognitive Status: Within Functional Limits for tasks assessed General Comments: history of dementia and memory deficits. Following simple cues  Blood pressure 131/69, pulse (!) 55, temperature (!) 97.4 F (36.3 C), temperature source Oral, resp. rate 17, height 6\' 3"  (1.905 m), weight 94.6 kg (208 lb 8.9 oz), SpO2 (!) 87 %. Physical Exam  Vitals reviewed. HENT:  Head: Normocephalic.  Eyes: EOM are normal.  Neck: Normal range of motion. Neck supple. No thyromegaly present.  Cardiovascular:  Cardiac rate controlled  GI: Soft. Bowel sounds are normal. He exhibits no distension.  Neurological: He is alert.  Provides name, age.  Needed subtle cues for date of birth. Told me he lives in [place]. Language intact, decreased insight and awareness.  Limited medical historian. UE 4/5 prox to distal. LE: 3- to 3/5 HF, 3/5 KE and 4/5 ADF/PF. Senses pain in all 4's.  Skin: Skin is warm and dry.  Psychiatric:  Pleasant, cooperative    LabResultsLast24Hours       Results for orders placed or performed during the hospital encounter of 12/23/17 (from the past 24 hour(s))  Glucose, capillary     Status: Abnormal   Collection Time: 12/28/17 11:44 AM  Result Value Ref Range   Glucose-Capillary 116 (H) 70 - 99 mg/dL   Comment 1 Capillary Specimen   Glucose, capillary     Status: Abnormal   Collection Time: 12/28/17  4:09 PM  Result Value Ref Range   Glucose-Capillary 106 (H) 70 - 99 mg/dL   Comment 1 Capillary Specimen       ImagingResults(Last48hours)  Ct Abdomen Wo Contrast  Result Date:  12/27/2017 CLINICAL DATA:  Nonpulsatile abdominal mass. Recent fall. Improving pulmonary opacities with diuresis. EXAM: CT CHEST AND ABDOMEN WITHOUT CONTRAST TECHNIQUE: Multidetector CT imaging of the chest and abdomen was performed following the standard protocol without intravenous contrast. COMPARISON:  Recent chest radiographs. Pelvic CT 03/08/2016, chest  CT 06/25/2015 and abdominopelvic CT 02/25/2015. FINDINGS: CT CHEST FINDINGS WITHOUT CONTRAST Cardiovascular: Diffuse atherosclerosis of the aorta, great vessels and coronary arteries. No acute vascular findings are seen on noncontrast imaging. The heart is mildly enlarged. Patient has a left subclavian pacemaker with leads in stable position. Probable aortic valvular calcifications. No significant pericardial effusion. Mediastinum/Nodes: There are no enlarged mediastinal, hilar or axillary lymph nodes. Small mediastinal lymph nodes are stable. The esophagus and thyroid gland appear unremarkable. Lungs/Pleura: Small dependent bilateral pleural effusions with associated bibasilar atelectasis. There is asymmetric interstitial prominence throughout the right lung as seen on recent radiographs. There is underlying moderate centrilobular emphysema. No focal nodule or endobronchial lesion seen. Musculoskeletal/Chest wall: No chest wall mass or suspicious osseous findings. A suggested midsternal fracture on sagittal image 110/7 is attributed to breathing artifact based on the axial images and is not associated with any surrounding soft tissue swelling. Right shoulder reverse arthroplasty and muscular atrophy noted. CT ABDOMEN FINDINGS Hepatobiliary: Multiple hepatic cysts appear stable. There is no significant biliary dilatation status post cholecystectomy. Pancreas: Unremarkable. No pancreatic ductal dilatation or surrounding inflammatory changes. Spleen: Normal in size without focal abnormality. Adrenals/Urinary Tract: Both adrenal glands appear normal. Stable  nonobstructing 5 mm calculus in the lower pole of the right kidney (image 82/3). No evidence of hydronephrosis, proximal ureteral calculus or asymmetric perinephric soft tissue stranding. Numerous bilateral renal cysts are grossly unchanged in size and appearance. Stomach/Bowel: No evidence of bowel wall thickening, distention or surrounding inflammatory change. Vascular/Lymphatic: There are no enlarged abdominal lymph nodes. There are stable small lymph nodes in the porta hepatis. There is diffuse aortic and branch vessel atherosclerosis. Other: The visualized anterior abdominal wall is intact. No ascites. No abdominal wall mass or hernia seen. Musculoskeletal: No acute or significant osseous findings. Multilevel spondylosis status post L4-5 fusion. Disc degeneration appears progressive at L2-3 and L3-4. IMPRESSION: 1. In correlation with recent chest radiographs, asymmetric interstitial opacities in the right lung, bilateral pleural effusions and bibasilar atelectasis probably reflect resolving pulmonary edema superimposed on emphysema. Continued chest radiographic follow up recommended. Emphysema (ICD10-J43.9). 2. No acute findings seen within the abdomen. 3. Stable nonobstructing right renal calculus and bilateral renal cysts. No hydronephrosis or acute inflammatory changes seen. 4. Aortic Atherosclerosis (ICD10-I70.0). Progressive lumbar disc degeneration. 5. No evidence of abdominal mass or definite acute osseous findings. Suggested sternal fracture on the sagittal images is attributed to breathing artifact. Correlate clinically. Electronically Signed   By: Richardean Sale M.D.   On: 12/27/2017 17:36   Ct Chest Wo Contrast  Result Date: 12/27/2017 CLINICAL DATA:  Nonpulsatile abdominal mass. Recent fall. Improving pulmonary opacities with diuresis. EXAM: CT CHEST AND ABDOMEN WITHOUT CONTRAST TECHNIQUE: Multidetector CT imaging of the chest and abdomen was performed following the standard protocol without  intravenous contrast. COMPARISON:  Recent chest radiographs. Pelvic CT 03/08/2016, chest CT 06/25/2015 and abdominopelvic CT 02/25/2015. FINDINGS: CT CHEST FINDINGS WITHOUT CONTRAST Cardiovascular: Diffuse atherosclerosis of the aorta, great vessels and coronary arteries. No acute vascular findings are seen on noncontrast imaging. The heart is mildly enlarged. Patient has a left subclavian pacemaker with leads in stable position. Probable aortic valvular calcifications. No significant pericardial effusion. Mediastinum/Nodes: There are no enlarged mediastinal, hilar or axillary lymph nodes. Small mediastinal lymph nodes are stable. The esophagus and thyroid gland appear unremarkable. Lungs/Pleura: Small dependent bilateral pleural effusions with associated bibasilar atelectasis. There is asymmetric interstitial prominence throughout the right lung as seen on recent radiographs. There is underlying moderate centrilobular emphysema. No focal  nodule or endobronchial lesion seen. Musculoskeletal/Chest wall: No chest wall mass or suspicious osseous findings. A suggested midsternal fracture on sagittal image 110/7 is attributed to breathing artifact based on the axial images and is not associated with any surrounding soft tissue swelling. Right shoulder reverse arthroplasty and muscular atrophy noted. CT ABDOMEN FINDINGS Hepatobiliary: Multiple hepatic cysts appear stable. There is no significant biliary dilatation status post cholecystectomy. Pancreas: Unremarkable. No pancreatic ductal dilatation or surrounding inflammatory changes. Spleen: Normal in size without focal abnormality. Adrenals/Urinary Tract: Both adrenal glands appear normal. Stable nonobstructing 5 mm calculus in the lower pole of the right kidney (image 82/3). No evidence of hydronephrosis, proximal ureteral calculus or asymmetric perinephric soft tissue stranding. Numerous bilateral renal cysts are grossly unchanged in size and appearance. Stomach/Bowel:  No evidence of bowel wall thickening, distention or surrounding inflammatory change. Vascular/Lymphatic: There are no enlarged abdominal lymph nodes. There are stable small lymph nodes in the porta hepatis. There is diffuse aortic and branch vessel atherosclerosis. Other: The visualized anterior abdominal wall is intact. No ascites. No abdominal wall mass or hernia seen. Musculoskeletal: No acute or significant osseous findings. Multilevel spondylosis status post L4-5 fusion. Disc degeneration appears progressive at L2-3 and L3-4. IMPRESSION: 1. In correlation with recent chest radiographs, asymmetric interstitial opacities in the right lung, bilateral pleural effusions and bibasilar atelectasis probably reflect resolving pulmonary edema superimposed on emphysema. Continued chest radiographic follow up recommended. Emphysema (ICD10-J43.9). 2. No acute findings seen within the abdomen. 3. Stable nonobstructing right renal calculus and bilateral renal cysts. No hydronephrosis or acute inflammatory changes seen. 4. Aortic Atherosclerosis (ICD10-I70.0). Progressive lumbar disc degeneration. 5. No evidence of abdominal mass or definite acute osseous findings. Suggested sternal fracture on the sagittal images is attributed to breathing artifact. Correlate clinically. Electronically Signed   By: Richardean Sale M.D.   On: 12/27/2017 17:36   US Renal  Result Date: 12/27/2017 CLINICAL DATA:  84 year old with personal history of prostate cancer, presenting with urinary tract infection and renal insufficiency. Current history of hypertension and diabetes. EXAM: RENAL / URINARY TRACT ULTRASOUND COMPLETE COMPARISON:  CT abdomen and pelvis 02/25/2015. FINDINGS: Right Kidney: Length: Approximately 10.9 cm. Echogenic renal parenchyma. Approximate 1.8 x 1.9 x 1.2 cm cyst arising from the UPPER pole, with smaller cysts elsewhere in the kidney, as noted on the prior CT. No visible solid masses. No hydronephrosis. Left Kidney:  Length: Approximately 12.7 cm. Echogenic renal parenchyma. Multiple cysts as noted on the prior CT, the largest arising from the mid kidney measuring approximately 4.0 x 3.4 x 4.7 cm. No visible solid masses. No hydronephrosis. Bladder: Decompressed by Foley catheter. IMPRESSION: 1. Echogenic renal parenchyma bilaterally indicating medical renal disease. 2. No evidence of hydronephrosis involving either kidney to suggest obstruction. 3. Multiple BILATERAL renal cysts as noted on a prior CT in 2016. Electronically Signed   By: Evangeline Dakin M.D.   On: 12/27/2017 12:42   Dg Chest Port 1 View  Result Date: 12/28/2017 CLINICAL DATA:  Short of breath, respiratory failure EXAM: PORTABLE CHEST 1 VIEW COMPARISON:  12/27/2017 FINDINGS: LEFT-sided pacemaker overlies normal cardiac silhouette. Bilateral small effusions. Mild interstitial edema pattern. No pneumothorax. No focal consolidation. IMPRESSION: Bilateral pleural effusions and mild interstitial edema Electronically Signed   By: Suzy Bouchard M.D.   On: 12/28/2017 08:47      Assessment/Plan: Diagnosis: encephalopathy due to urosepsis/multiple medical, hx of prior right CVA 1. Does the need for close, 24 hr/day medical supervision in concert with the patient's rehab needs  make it unreasonable for this patient to be served in a less intensive setting? Yes 2. Co-Morbidities requiring supervision/potential complications: radiation cystitis with chronic incontinence,  Afib,  3. Due to bladder management, bowel management, safety, skin/wound care, disease management, medication administration, pain management and patient education, does the patient require 24 hr/day rehab nursing? Yes 4. Does the patient require coordinated care of a physician, rehab nurse, PT (1-2 hrs/day, 5 days/week), OT (1-2 hrs/day, 5 days/week) and SLP (1-2 hrs/day, 5 days/week) to address physical and functional deficits in the context of the above medical diagnosis(es)?  Yes Addressing deficits in the following areas: balance, endurance, locomotion, strength, transferring, bowel/bladder control, bathing, dressing, feeding, grooming, toileting, cognition and psychosocial support 5. Can the patient actively participate in an intensive therapy program of at least 3 hrs of therapy per day at least 5 days per week? Yes 6. The potential for patient to make measurable gains while on inpatient rehab is excellent 7. Anticipated functional outcomes upon discharge from inpatient rehab are modified independent and supervision  with PT, modified independent and supervision with OT, supervision with SLP. 8. Estimated rehab length of stay to reach the above functional goals is: 13-18 days 9. Anticipated D/C setting: Home 10. Anticipated post D/C treatments: [place] therapy 11. Overall Rehab/Functional Prognosis: excellent  RECOMMENDATIONS: This patient's condition is appropriate for continued rehabilitative care in the following setting: CIR Patient has agreed to participate in recommended program. Yes Note that insurance prior authorization may be required for reimbursement for recommended care.  Comment: Pt was active/mobile prior to his recent medical decline. Wife is very supportive. Rehab Admissions Coordinator to follow up.  Thanks,  [name], MD, Mellody Drown  I have personally performed a face to face diagnostic evaluation of this patient. Additionally, I have reviewed and concur with the physician assistant's documentation above.  Lavon Paganini Angiulli, PA-C 12/29/2017          Revision History

## 2017-12-30 NOTE — Progress Notes (Signed)
Pt arrived to unit with wife and family, at this time pt is partial code but does want to discuss with doctor.  Pt alert with stimulation, wife informed this nurse that was given oxy this am and usually takes tylenol at home and oxy makes him very sleepy and has hallucinations. Bed in low position, call bell in reach, alarm on and functional Stage II noted to buttocks, will continue to monitor. Marland Kitchen

## 2017-12-30 NOTE — Discharge Summary (Addendum)
Discharge Summary  Christopher Baker TXM:[id number] DOB: 21-Dec-1933  PCP: Haywood Pao, MD  Admit date: 12/23/2017 Discharge date: 12/30/2017  Time spent: 55mins   Patient is discharged to inpatient rehab  Patient is to follow up with the following physicians after being discharged from rehab:  1. F/u with PMD within a week  for hospital discharge follow up, repeat cbc/bmp at follow up. 2. F/u with cardiology/EP  Dr Lovena Le on 8/14. 3. F/u with urology Dr Amalia Hailey for urinary retention, voiding trial, recurrent uti. 4. F/u with Rheumatology   Discharge Diagnoses:  Active Hospital Problems   Diagnosis Date Noted  . E coli bacteremia 12/23/2017  . Atrial fibrillation with rapid ventricular response ([place])   . Acute respiratory failure with hypoxemia ([place])   . Aspiration pneumonia of right upper lobe due to gastric secretions ([place])   . Drug-induced torsades de pointes 12/24/2017  . Sepsis ([place]) 12/23/2017  . Ventricular tachycardia ([place]) 12/23/2017  . Pressure injury of skin 12/23/2017  . Urinary tract infection 12/23/2017  . Acute pulmonary edema ([place]) 12/23/2017    Resolved Hospital Problems  No resolved problems to display.    Discharge Condition: stable  Diet recommendation: heart healthy  Filed Weights   12/28/17 0500 12/29/17 1837 12/30/17 0500  Weight: 94.6 kg (208 lb 8.9 oz) 98.1 kg (216 lb 4.3 oz) 94.5 kg (208 lb 5.4 oz)    History of present illness: (per admitting MD DR Cruzita Lederer) PCP: Haywood Pao, MD  Patient coming from: Home  Chief Complaint: Acute encephalopathy  HPI: Christopher Baker is a 84 y.o. male with medical history significant of paroxysmal A. fib currently has a pacemaker, prior CVA, chronic systolic CHF with an EF of 25%, mild dementia, hypertension, hyperlipidemia, who is being brought to the hospital by his wife after she found him early this morning being confused and poorly responsive.  EMS was called and patient was brought to the ED.   He is sleepy in the emergency room, wakes up intermittently but cannot stay awake to hold a conversation.  Per wife, he was in normal state of health until yesterday, and this morning he was more confused and she could not wake him up.  He has been having difficulties with fluid overload recently, so his cardiologist to place patient on Lasix as well as compression stockings.  Wife tells me that has not helped as much.  He denies recently any chest pain, abdominal pain, nausea or vomiting.  Of note, he was recently hospitalized last month with sepsis due to urinary tract infection.  ED Course: In the ED he is febrile to 104, goes in and out of A. fib with rates between 90s and 150s, his blood pressure had couple of low readings into the 90s but on my evaluation he is normotensive.  He was given vancomycin and Zosyn received cultures, he was given IV fluids per protocol given lactic acid of 4 and we were asked to admit to stepdown for sepsis.   He is admitted to hospital medicine service on 7/5 pm, the evening onf 7/5, he developed pulseless VT cardiac arrest , he received cpr and was defibrillated. He is transferred to ICU. He improved and transferred back to hospital medicine service. He is to discharge to inpatient rehab.    Hospital Course:  Principal Problem:   E coli bacteremia Active Problems:   Sepsis ([place])   Ventricular tachycardia ([facility])   Pressure injury of skin   Urinary tract infection  Acute pulmonary edema ([facility])   Drug-induced torsades de pointes   Atrial fibrillation with rapid ventricular response ([facility])   Acute respiratory failure with hypoxemia ([facility])   Aspiration pneumonia of right upper lobe due to gastric secretions ([facility])   Sepsis presented on admission /immunosuppressed status on chronic prednisone and methotrexate , acute metabolic encephalopathy, recurrent ecoli uti and ecoli bacteremia -He finished abx rocephin  On 7/11 for treatment of bacteremia per ID  recommendation -Repeat blood culture no growth, wbc trending down (wbc 33.5 on presentation, wbc 12.8 at discharge) -No TEE needed per cardiology   Elevated lft on presentation, likely from sepsis, lft has  Normalized.   Torsades /pulseless VT cardiac arrest in the evening of 7/5 in the setting of sepsis and tikosyn use  -S/p cpr and subsequently was shocked out of v tach with ROSC -He was on lidocaine drip from 7/6 to 7/8 -He is off tikosyn -Avoid Qtc prolonging agent -keep K>4, mag >2 -per EP patient is not a candidate for icd. -c/o sternum pain on deep inspiration -EP input appreciated, patient is to follow up with Dr Lovena Le in 4weeks  Chronic afib  In afib with intermittent pacing on tele Rate controlled on lopressor,  continue apixaban for stroke prevention   Acute on Chronic systolic chf/ bilateral pleural effusion and pedal edema Ef 25% S/p biv pacemaker Improving on iv lasix -he is discharged on oral lasix 40mg  daily, with potassium supplement  -lisinopril held since admission due to sepsis and ckdIII, he is to follow up with cardiology to decide on lisinopril resumption.   CKDIII Cr at baseline, renal dosing meds  Anemia of chronic disease hgb stable at baseline around 10.  H/o prostate cancer s/p radiation with radiation cystitis Urinary retention, foley placed on 7/5, continue flomax Recurrent UTI, urology to decide on the need of prophylaxis abx at hospital discharge follow up. Renal US no hydro, ct ab/pel no acute findings Will need to follow up with urology Dr. Amalia Hailey    History of previous CVA with vascular dementia -No new focal neuro deficits. Continue Aricept, statin and Eliquis  Psoriatic arthritis - methotrexate and daily prednisone held due to sepsis, patient is to follow up with rheumatology to discuss timing to resume immunosuppressive therapy for psoriatic arthritis  Stage I sacral pressure injury presented on admission Skin care  per protocol   FTT, CIR placement   Code Status: partial code, no cpr  Family Communication: patient and family at bedside  Disposition Plan: CIR   Consultants:  Critical care  Cardiology/EP  CIR  ID  Procedures:  Cpr/defibrillation  Foley insertion    Antibiotics:  As above    Discharge Exam: BP 133/71 (BP Location: Left Arm)   Pulse 73   Temp 97.7 F (36.5 C)   Resp 18   Ht 6\' 3"  (1.905 m)   Wt 94.5 kg (208 lb 5.4 oz)   SpO2 95%   BMI 26.04 kg/m   General: improving, getting stronger Cardiovascular: paced rhythm Respiratory: diminished at basis, no wheezing, no rales, no rhonchi Abdomen: Soft/ND/NT, positive BS Musculoskeletal: bilateral pedal Edema Neuro: alert, oriented to person and place, oriented to year, not to the month. Impaired short term memory       Discharge Instructions You were cared for by a hospitalist during your hospital stay. If you have any questions about your discharge medications or the care you received while you were in the hospital after you are discharged, you can call the unit and  asked to speak with the hospitalist on call if the hospitalist that took care of you is not available. Once you are discharged, your primary care physician will handle any further medical issues. Please note that NO REFILLS for any discharge medications will be authorized once you are discharged, as it is imperative that you return to your primary care physician (or establish a relationship with a primary care physician if you do not have one) for your aftercare needs so that they can reassess your need for medications and monitor your lab values.  Discharge Instructions    Diet - low sodium heart healthy   Complete by:  As directed    Increase activity slowly   Complete by:  As directed      Allergies as of 12/30/2017      Reactions   Ciprofloxacin Nausea Only, Other (See Comments)   Stomach ache   Hydrocodone-acetaminophen  Other (See Comments)   Hallucinations in higher doses   Other Other (See Comments)   Rash from neoprene wrap after knee surgery   Shellfish Allergy Nausea And Vomiting, Other (See Comments)   Reaction to scallops   Tramadol Other (See Comments)   Pt becomes combative per wife   Tikosyn [dofetilide]    On 12/23/17 Torsades in setting of sepsis and on Tikosyn. (Dr. Caryl Comes noted: patient had a late development following presentation with septic shock of torsade de pointes.  Renal function all relatively normal on presentation had gone from 1.1--1.7 in the consequence of a shock.  This and volume resuscitation which can acutely lengthen QT intervals likely contributed to the electrical instability in the torsade de pointes).      Medication List    STOP taking these medications   aspirin 325 MG tablet   dofetilide 500 MCG capsule Commonly known as:  TIKOSYN   lisinopril 5 MG tablet Commonly known as:  PRINIVIL,ZESTRIL   methotrexate 2.5 MG tablet Commonly known as:  RHEUMATREX   oxyCODONE-acetaminophen 10-325 MG tablet Commonly known as:  PERCOCET Replaced by:  oxyCODONE-acetaminophen 5-325 MG tablet   predniSONE 5 MG tablet Commonly known as:  DELTASONE     TAKE these medications   acetaminophen 500 MG tablet Commonly known as:  TYLENOL Take 1,000 mg by mouth 2 (two) times daily.   amoxicillin 500 MG capsule Commonly known as:  AMOXIL Take 2,000 mg by mouth See admin instructions. Take 2000 mg by mouth one hour prior to dental apointments   apixaban 2.5 MG Tabs tablet Commonly known as:  ELIQUIS Take 1 tablet (2.5 mg total) by mouth 2 (two) times daily. What changed:    medication strength  how much to take   atorvastatin 20 MG tablet Commonly known as:  LIPITOR Take 20 mg by mouth daily.   cyanocobalamin 1000 MCG/ML injection Commonly known as:  (VITAMIN B-12) Inject 1,000 mcg into the muscle every 30 (thirty) days.   donepezil 10 MG tablet Commonly known as:   ARICEPT TAKE ONE TABLET BY MOUTH EVERY NIGHT AT BEDTIME   DULoxetine 30 MG capsule Commonly known as:  CYMBALTA Take 1 capsule (30 mg total) by mouth at bedtime. What changed:    when to take this  Another medication with the same name was removed. Continue taking this medication, and follow the directions you see here.   ezetimibe 10 MG tablet Commonly known as:  ZETIA Take 1 tablet (10 mg total) by mouth daily.   ferrous sulfate 325 (65 FE) MG tablet Take 1 tablet (  325 mg total) by mouth daily with breakfast. What changed:  when to take this   folic acid 1 MG tablet Commonly known as:  FOLVITE Take 1 mg by mouth at bedtime.   furosemide 40 MG tablet Commonly known as:  LASIX Take 80 mg by mouth daily.   loperamide 2 MG tablet Commonly known as:  IMODIUM A-D Take 2 mg by mouth 3 (three) times daily as needed for diarrhea or loose stools.   LUBRICANT EYE OP Place 2 drops into both eyes daily as needed (for dry eyes).   metoprolol succinate 25 MG 24 hr tablet Commonly known as:  TOPROL XL Take 1 tablet (25 mg total) by mouth at bedtime.   oxyCODONE-acetaminophen 5-325 MG tablet Commonly known as:  PERCOCET/ROXICET Take 0.5-1 tablets by mouth 2 (two) times daily as needed for severe pain. Replaces:  oxyCODONE-acetaminophen 10-325 MG tablet   pantoprazole 40 MG tablet Commonly known as:  PROTONIX Take 1 tablet (40 mg total) by mouth daily. What changed:  when to take this   tamsulosin 0.4 MG Caps capsule Commonly known as:  FLOMAX Take 0.4 mg by mouth daily.   Vitamin D (Ergocalciferol) 50000 units Caps capsule Commonly known as:  DRISDOL Take 50,000 Units by mouth every Sunday.      Allergies  Allergen Reactions  . Ciprofloxacin Nausea Only and Other (See Comments)    Stomach ache  . Hydrocodone-Acetaminophen Other (See Comments)    Hallucinations in higher doses  . Other Other (See Comments)    Rash from neoprene wrap after knee surgery  . Shellfish  Allergy Nausea And Vomiting and Other (See Comments)    Reaction to scallops  . Tramadol Other (See Comments)    Pt becomes combative per wife  . Tikosyn [Dofetilide]     On 12/23/17 Torsades in setting of sepsis and on Tikosyn. (Dr. Caryl Comes noted: patient had a late development following presentation with septic shock of torsade de pointes.  Renal function all relatively normal on presentation had gone from 1.1--1.7 in the consequence of a shock.  This and volume resuscitation which can acutely lengthen QT intervals likely contributed to the electrical instability in the torsade de pointes).   Follow-up Information    Evans Lance, MD Follow up on 02/01/2018.   Specialty:  Cardiology Why:  at 12:30PM  Contact information: [address] [address] [phone]        Tisovec, Fransico Him, MD Follow up.   Specialty:  Internal Medicine Contact information: [place] [address] [phone]        Domingo Pulse, MD Follow up in 3 week(s).   Specialty:  Urology Why:  urinary retention, foley management, voiding trial, recurrent uti Contact information: [place] [address] [phone]            The results of significant diagnostics from this hospitalization (including imaging, microbiology, ancillary and laboratory) are listed below for reference.    Significant Diagnostic Studies: Ct Abdomen Wo Contrast  Result Date: 12/27/2017 CLINICAL DATA:  Nonpulsatile abdominal mass. Recent fall. Improving pulmonary opacities with diuresis. EXAM: CT CHEST AND ABDOMEN WITHOUT CONTRAST TECHNIQUE: Multidetector CT imaging of the chest and abdomen was performed following the standard protocol without intravenous contrast. COMPARISON:  Recent chest radiographs. Pelvic CT 03/08/2016, chest CT 06/25/2015 and abdominopelvic CT 02/25/2015. FINDINGS: CT CHEST FINDINGS WITHOUT CONTRAST Cardiovascular: Diffuse atherosclerosis of  the aorta, great vessels and coronary arteries. No acute  vascular findings are seen on noncontrast imaging. The heart is mildly enlarged. Patient has a left subclavian pacemaker with leads in stable position. Probable aortic valvular calcifications. No significant pericardial effusion. Mediastinum/Nodes: There are no enlarged mediastinal, hilar or axillary lymph nodes. Small mediastinal lymph nodes are stable. The esophagus and thyroid gland appear unremarkable. Lungs/Pleura: Small dependent bilateral pleural effusions with associated bibasilar atelectasis. There is asymmetric interstitial prominence throughout the right lung as seen on recent radiographs. There is underlying moderate centrilobular emphysema. No focal nodule or endobronchial lesion seen. Musculoskeletal/Chest wall: No chest wall mass or suspicious osseous findings. A suggested midsternal fracture on sagittal image 110/7 is attributed to breathing artifact based on the axial images and is not associated with any surrounding soft tissue swelling. Right shoulder reverse arthroplasty and muscular atrophy noted. CT ABDOMEN FINDINGS Hepatobiliary: Multiple hepatic cysts appear stable. There is no significant biliary dilatation status post cholecystectomy. Pancreas: Unremarkable. No pancreatic ductal dilatation or surrounding inflammatory changes. Spleen: Normal in size without focal abnormality. Adrenals/Urinary Tract: Both adrenal glands appear normal. Stable nonobstructing 5 mm calculus in the lower pole of the right kidney (image 82/3). No evidence of hydronephrosis, proximal ureteral calculus or asymmetric perinephric soft tissue stranding. Numerous bilateral renal cysts are grossly unchanged in size and appearance. Stomach/Bowel: No evidence of bowel wall thickening, distention or surrounding inflammatory change. Vascular/Lymphatic: There are no enlarged abdominal lymph nodes. There are stable small lymph nodes in the porta hepatis. There is diffuse  aortic and branch vessel atherosclerosis. Other: The visualized anterior abdominal wall is intact. No ascites. No abdominal wall mass or hernia seen. Musculoskeletal: No acute or significant osseous findings. Multilevel spondylosis status post L4-5 fusion. Disc degeneration appears progressive at L2-3 and L3-4. IMPRESSION: 1. In correlation with recent chest radiographs, asymmetric interstitial opacities in the right lung, bilateral pleural effusions and bibasilar atelectasis probably reflect resolving pulmonary edema superimposed on emphysema. Continued chest radiographic follow up recommended. Emphysema (ICD10-J43.9). 2. No acute findings seen within the abdomen. 3. Stable nonobstructing right renal calculus and bilateral renal cysts. No hydronephrosis or acute inflammatory changes seen. 4. Aortic Atherosclerosis (ICD10-I70.0). Progressive lumbar disc degeneration. 5. No evidence of abdominal mass or definite acute osseous findings. Suggested sternal fracture on the sagittal images is attributed to breathing artifact. Correlate clinically. Electronically Signed   By: Richardean Sale M.D.   On: 12/27/2017 17:36   Ct Chest Wo Contrast  Result Date: 12/27/2017 CLINICAL DATA:  Nonpulsatile abdominal mass. Recent fall. Improving pulmonary opacities with diuresis. EXAM: CT CHEST AND ABDOMEN WITHOUT CONTRAST TECHNIQUE: Multidetector CT imaging of the chest and abdomen was performed following the standard protocol without intravenous contrast. COMPARISON:  Recent chest radiographs. Pelvic CT 03/08/2016, chest CT 06/25/2015 and abdominopelvic CT 02/25/2015. FINDINGS: CT CHEST FINDINGS WITHOUT CONTRAST Cardiovascular: Diffuse atherosclerosis of the aorta, great vessels and coronary arteries. No acute vascular findings are seen on noncontrast imaging. The heart is mildly enlarged. Patient has a left subclavian pacemaker with leads in stable position. Probable aortic valvular calcifications. No significant pericardial  effusion. Mediastinum/Nodes: There are no enlarged mediastinal, hilar or axillary lymph nodes. Small mediastinal lymph nodes are stable. The esophagus and thyroid gland appear unremarkable. Lungs/Pleura: Small dependent bilateral pleural effusions with associated bibasilar atelectasis. There is asymmetric interstitial prominence throughout the right lung as seen on recent radiographs. There is underlying moderate centrilobular emphysema. No focal nodule or endobronchial lesion seen. Musculoskeletal/Chest wall: No chest wall mass or suspicious osseous findings. A suggested midsternal fracture on sagittal image 110/7 is attributed  to breathing artifact based on the axial images and is not associated with any surrounding soft tissue swelling. Right shoulder reverse arthroplasty and muscular atrophy noted. CT ABDOMEN FINDINGS Hepatobiliary: Multiple hepatic cysts appear stable. There is no significant biliary dilatation status post cholecystectomy. Pancreas: Unremarkable. No pancreatic ductal dilatation or surrounding inflammatory changes. Spleen: Normal in size without focal abnormality. Adrenals/Urinary Tract: Both adrenal glands appear normal. Stable nonobstructing 5 mm calculus in the lower pole of the right kidney (image 82/3). No evidence of hydronephrosis, proximal ureteral calculus or asymmetric perinephric soft tissue stranding. Numerous bilateral renal cysts are grossly unchanged in size and appearance. Stomach/Bowel: No evidence of bowel wall thickening, distention or surrounding inflammatory change. Vascular/Lymphatic: There are no enlarged abdominal lymph nodes. There are stable small lymph nodes in the porta hepatis. There is diffuse aortic and branch vessel atherosclerosis. Other: The visualized anterior abdominal wall is intact. No ascites. No abdominal wall mass or hernia seen. Musculoskeletal: No acute or significant osseous findings. Multilevel spondylosis status post L4-5 fusion. Disc degeneration  appears progressive at L2-3 and L3-4. IMPRESSION: 1. In correlation with recent chest radiographs, asymmetric interstitial opacities in the right lung, bilateral pleural effusions and bibasilar atelectasis probably reflect resolving pulmonary edema superimposed on emphysema. Continued chest radiographic follow up recommended. Emphysema (ICD10-J43.9). 2. No acute findings seen within the abdomen. 3. Stable nonobstructing right renal calculus and bilateral renal cysts. No hydronephrosis or acute inflammatory changes seen. 4. Aortic Atherosclerosis (ICD10-I70.0). Progressive lumbar disc degeneration. 5. No evidence of abdominal mass or definite acute osseous findings. Suggested sternal fracture on the sagittal images is attributed to breathing artifact. Correlate clinically. Electronically Signed   By: Richardean Sale M.D.   On: 12/27/2017 17:36   US Renal  Result Date: 12/27/2017 CLINICAL DATA:  84 year old with personal history of prostate cancer, presenting with urinary tract infection and renal insufficiency. Current history of hypertension and diabetes. EXAM: RENAL / URINARY TRACT ULTRASOUND COMPLETE COMPARISON:  CT abdomen and pelvis 02/25/2015. FINDINGS: Right Kidney: Length: Approximately 10.9 cm. Echogenic renal parenchyma. Approximate 1.8 x 1.9 x 1.2 cm cyst arising from the UPPER pole, with smaller cysts elsewhere in the kidney, as noted on the prior CT. No visible solid masses. No hydronephrosis. Left Kidney: Length: Approximately 12.7 cm. Echogenic renal parenchyma. Multiple cysts as noted on the prior CT, the largest arising from the mid kidney measuring approximately 4.0 x 3.4 x 4.7 cm. No visible solid masses. No hydronephrosis. Bladder: Decompressed by Foley catheter. IMPRESSION: 1. Echogenic renal parenchyma bilaterally indicating medical renal disease. 2. No evidence of hydronephrosis involving either kidney to suggest obstruction. 3. Multiple BILATERAL renal cysts as noted on a prior CT in 2016.  Electronically Signed   By: Evangeline Dakin M.D.   On: 12/27/2017 12:42   Dg Chest Port 1 View  Result Date: 12/28/2017 CLINICAL DATA:  Short of breath, respiratory failure EXAM: PORTABLE CHEST 1 VIEW COMPARISON:  12/27/2017 FINDINGS: LEFT-sided pacemaker overlies normal cardiac silhouette. Bilateral small effusions. Mild interstitial edema pattern. No pneumothorax. No focal consolidation. IMPRESSION: Bilateral pleural effusions and mild interstitial edema Electronically Signed   By: Suzy Bouchard M.D.   On: 12/28/2017 08:47   Dg Chest Port 1 View  Result Date: 12/27/2017 CLINICAL DATA:  Short of breath, respiratory failure EXAM: PORTABLE CHEST 1 VIEW COMPARISON:  Portable chest x-ray of 12/25/2016 FINDINGS: There has been some improvement in opacity throughout the right lung although there may still be either mild pulmonary vascular congestion which is somewhat asymmetric or pneumonia.  There may well be small pleural effusions present. Cardiomegaly is stable and pacer wires remain. Reverse right shoulder replacement is noted. IMPRESSION: 1. Some improvement in opacity throughout the right lung. This may be due to asymmetrical edema or possibly pneumonia with probable small pleural effusions. 2. Stable cardiomegaly with permanent pacemaker. Electronically Signed   By: Ivar Drape M.D.   On: 12/27/2017 08:56   Dg Chest Port 1 View  Result Date: 12/25/2017 CLINICAL DATA:  Respiratory failure/hypoxia EXAM: PORTABLE CHEST 1 VIEW COMPARISON:  December 23, 2017 FINDINGS: Asymmetric interstitial edema remains on the right. Left lung is essentially clear. There is stable cardiomegaly with pulmonary vascularity normal. There is aortic atherosclerosis. No adenopathy. Pacemaker leads are attached to the right atrium, right ventricle, and coronary sinus. There is a total shoulder replacement on the right. IMPRESSION: Persistent asymmetric interstitial edema on the right. No frank consolidation. Left lung clear. Stable  cardiac prominence. There is aortic atherosclerosis. Stable pacemaker lead positioning. Aortic Atherosclerosis (ICD10-I70.0). Electronically Signed   By: Lowella Grip III M.D.   On: 12/25/2017 07:11   Dg Chest Port 1 View  Result Date: 12/23/2017 CLINICAL DATA:  Fluid overload EXAM: PORTABLE CHEST 1 VIEW COMPARISON:  12/23/2017 FINDINGS: Cardiac pacemaker. Cardiac enlargement with pulmonary vascular congestion. Since the prior study, there is increasing airspace and interstitial infiltration in the lungs suggesting progression of pulmonary edema. No pleural effusions. No pneumothorax. Mediastinal contours appear intact. Calcification of the aorta. Degenerative changes in the spine and left shoulder. Postoperative change in the right shoulder. IMPRESSION: Cardiac enlargement with pulmonary vascular congestion. Increasing pulmonary edema since previous study. Electronically Signed   By: Lucienne Capers M.D.   On: 12/23/2017 22:01   Dg Chest Port 1 View  Result Date: 12/23/2017 CLINICAL DATA:  Sepsis, UTI symptoms, hematuria, history stroke, pacemaker/SVT, hypertension, former smoker EXAM: PORTABLE CHEST 1 VIEW COMPARISON:  Portable exam 0816 hours compared to 10/29/2017 FINDINGS: LEFT subclavian transvenous pacemaker with leads projecting over RIGHT atrium, RIGHT ventricle, and coronary sinus. Enlargement of cardiac silhouette with pulmonary vascular congestion. Atherosclerotic calcification aorta. Hazy interstitial infiltrate bilaterally likely pulmonary edema. No segmental consolidation, pleural effusion or pneumothorax. Bones demineralized. Reverse RIGHT shoulder arthroplasty noted. IMPRESSION: Enlargement of cardiac silhouette with vascular congestion and probable mild pulmonary edema. Electronically Signed   By: Lavonia Dana M.D.   On: 12/23/2017 08:25    Microbiology: Recent Results (from the past 240 hour(s))  Blood Culture (routine x 2)     Status: Abnormal   Collection Time: 12/23/17  8:13 AM   Result Value Ref Range Status   Specimen Description BLOOD RIGHT ANTECUBITAL  Final   Special Requests   Final    BOTTLES DRAWN AEROBIC AND ANAEROBIC Blood Culture adequate volume   Culture  Setup Time   Final    GRAM NEGATIVE RODS IN BOTH AEROBIC AND ANAEROBIC BOTTLES CRITICAL RESULT CALLED TO, READ BACK BY AND VERIFIED WITHFerne Coe [facility] 2126 12/23/17 A BROWNING Performed at [facility], [place] [address], [place], [place] [address]    Culture ESCHERICHIA COLI (A)  Final   Report Status 12/25/2017 FINAL  Final   Organism ID, Bacteria ESCHERICHIA COLI  Final      Susceptibility   Escherichia coli - MIC*    AMPICILLIN >=32 RESISTANT Resistant     CEFAZOLIN <=4 SENSITIVE Sensitive     CEFEPIME <=1 SENSITIVE Sensitive     CEFTAZIDIME <=1 SENSITIVE Sensitive     CEFTRIAXONE <=1 SENSITIVE Sensitive  CIPROFLOXACIN <=0.25 SENSITIVE Sensitive     GENTAMICIN <=1 SENSITIVE Sensitive     IMIPENEM <=0.25 SENSITIVE Sensitive     TRIMETH/SULFA <=20 SENSITIVE Sensitive     AMPICILLIN/SULBACTAM >=32 RESISTANT Resistant     PIP/TAZO <=4 SENSITIVE Sensitive     Extended ESBL NEGATIVE Sensitive     * ESCHERICHIA COLI  Blood Culture ID Panel (Reflexed)     Status: Abnormal   Collection Time: 12/23/17  8:13 AM  Result Value Ref Range Status   Enterococcus species NOT DETECTED NOT DETECTED Final   Listeria monocytogenes NOT DETECTED NOT DETECTED Final   Staphylococcus species NOT DETECTED NOT DETECTED Final   Staphylococcus aureus NOT DETECTED NOT DETECTED Final   Streptococcus species NOT DETECTED NOT DETECTED Final   Streptococcus agalactiae NOT DETECTED NOT DETECTED Final   Streptococcus pneumoniae NOT DETECTED NOT DETECTED Final   Streptococcus pyogenes NOT DETECTED NOT DETECTED Final   Acinetobacter baumannii NOT DETECTED NOT DETECTED Final   Enterobacteriaceae species DETECTED (A) NOT DETECTED Final    Comment: Enterobacteriaceae represent a large family of gram-negative bacteria,  not a single organism. CRITICAL RESULT CALLED TO, READ BACK BY AND VERIFIED WITH: E MARTIN PHARMD 2126 12/23/17 A BROWNING    Enterobacter cloacae complex NOT DETECTED NOT DETECTED Final   Escherichia coli DETECTED (A) NOT DETECTED Final    Comment: CRITICAL RESULT CALLED TO, READ BACK BY AND VERIFIED WITH: E MARTIN PHARMD 2126 12/23/17 A BROWNING    Klebsiella oxytoca NOT DETECTED NOT DETECTED Final   Klebsiella pneumoniae NOT DETECTED NOT DETECTED Final   Proteus species NOT DETECTED NOT DETECTED Final   Serratia marcescens NOT DETECTED NOT DETECTED Final   Carbapenem resistance NOT DETECTED NOT DETECTED Final   Haemophilus influenzae NOT DETECTED NOT DETECTED Final   Neisseria meningitidis NOT DETECTED NOT DETECTED Final   Pseudomonas aeruginosa NOT DETECTED NOT DETECTED Final   Candida albicans NOT DETECTED NOT DETECTED Final   Candida glabrata NOT DETECTED NOT DETECTED Final   Candida krusei NOT DETECTED NOT DETECTED Final   Candida parapsilosis NOT DETECTED NOT DETECTED Final   Candida tropicalis NOT DETECTED NOT DETECTED Final    Comment: Performed at [facility], [place] [address] [place], [place] [address]  Blood Culture (routine x 2)     Status: Abnormal   Collection Time: 12/23/17  8:30 AM  Result Value Ref Range Status   Specimen Description BLOOD LEFT HAND  Final   Special Requests   Final    BOTTLES DRAWN AEROBIC AND ANAEROBIC Blood Culture results may not be optimal due to an inadequate volume of blood received in culture bottles   Culture  Setup Time   Final    GRAM NEGATIVE RODS IN BOTH AEROBIC AND ANAEROBIC BOTTLES CRITICAL VALUE NOTED.  VALUE IS CONSISTENT WITH PREVIOUSLY REPORTED AND CALLED VALUE.    Culture (A)  Final    ESCHERICHIA COLI SUSCEPTIBILITIES PERFORMED ON PREVIOUS CULTURE WITHIN THE LAST 5 DAYS. Performed at [facility], [place] [address], [place], [place] [address]    Report Status 12/25/2017 FINAL  Final  Urine culture     Status:  Abnormal   Collection Time: 12/23/17  8:44 AM  Result Value Ref Range Status   Specimen Description URINE, CATHETERIZED  Final   Special Requests   Final    NONE Performed at [facility], [place] [address], [place], [place] [address]    Culture >=100,000 COLONIES/mL ESCHERICHIA COLI (A)  Final  Report Status 12/25/2017 FINAL  Final   Organism ID, Bacteria ESCHERICHIA COLI (A)  Final      Susceptibility   Escherichia coli - MIC*    AMPICILLIN >=32 RESISTANT Resistant     CEFAZOLIN 16 SENSITIVE Sensitive     CEFTRIAXONE <=1 SENSITIVE Sensitive     CIPROFLOXACIN <=0.25 SENSITIVE Sensitive     GENTAMICIN <=1 SENSITIVE Sensitive     IMIPENEM <=0.25 SENSITIVE Sensitive     NITROFURANTOIN <=16 SENSITIVE Sensitive     TRIMETH/SULFA <=20 SENSITIVE Sensitive     AMPICILLIN/SULBACTAM >=32 RESISTANT Resistant     PIP/TAZO <=4 SENSITIVE Sensitive     Extended ESBL NEGATIVE Sensitive     * >=100,000 COLONIES/mL ESCHERICHIA COLI  MRSA PCR Screening     Status: None   Collection Time: 12/23/17  9:06 PM  Result Value Ref Range Status   MRSA by PCR NEGATIVE NEGATIVE Final    Comment:        The GeneXpert MRSA Assay (FDA approved for NASAL specimens only), is one component of a comprehensive MRSA colonization surveillance program. It is not intended to diagnose MRSA infection nor to guide or monitor treatment for MRSA infections. Performed at [facility], [place] [address] [place], [place] [address]   Culture, blood (Routine X 2) w Reflex to ID Panel     Status: None   Collection Time: 12/24/17 12:15 PM  Result Value Ref Range Status   Specimen Description BLOOD BLOOD LEFT HAND  Final   Special Requests   Final    BOTTLES DRAWN AEROBIC ONLY Blood Culture results may not be optimal due to an inadequate volume of blood received in culture bottles   Culture   Final    NO GROWTH 5 DAYS Performed at [facility], [place] [address], [place], [place] [address]    Report Status  12/29/2017 FINAL  Final  Culture, blood (Routine X 2) w Reflex to ID Panel     Status: None   Collection Time: 12/24/17 12:15 PM  Result Value Ref Range Status   Specimen Description BLOOD BLOOD RIGHT HAND  Final   Special Requests   Final    BOTTLES DRAWN AEROBIC AND ANAEROBIC Blood Culture adequate volume   Culture   Final    NO GROWTH 5 DAYS Performed at [facility], [place] [address], [place], [place] [address]    Report Status 12/29/2017 FINAL  Final     Labs: Basic Metabolic Panel: Recent Labs  Lab 12/23/17 2205  12/24/17 5009  12/25/17 0334 12/26/17 0314 12/27/17 0303 12/28/17 0156 12/29/17 0748 12/30/17 0424  NA 143   < > 145   < > 144 143 142 141 143 144  K 4.5   < > 3.8   < > 3.6 3.6 4.0 3.6 3.5 3.6  CL 112*   < > 110   < > 112* 109 109 107 106 105  CO2 16*   < > 20*   < > 24 25 17* 25 27 25   GLUCOSE 200*   < > 133*   < > 137* 135* 124* 128* 121* 116*  BUN 28*   < > 33*   < > 36* 35* 39* 38* 40* 35*  CREATININE 1.66*   < > 1.53*   < > 1.52* 1.49* 1.57* 1.49* 1.62* 1.57*  CALCIUM 8.9   < > 9.2   < > 9.0 9.0 9.0 9.1 9.2 9.1  MG 2.0  --  2.0  --  2.1  --   --   --   --  2.5*  PHOS 4.1  --   --   --   --   --   --   --   --   --    < > = values in this interval not displayed.   Liver Function Tests: Recent Labs  Lab 12/24/17 0342 12/30/17 0424  AST 145* 33  ALT 125* 43  ALKPHOS 90 103  BILITOT 0.8 0.7  PROT 5.4* 5.1*  ALBUMIN 2.5* 2.1*   No results for input(s): LIPASE, AMYLASE in the last 168 hours. No results for input(s): AMMONIA in the last 168 hours. CBC: Recent Labs  Lab 12/25/17 0334 12/26/17 0314 12/27/17 0303 12/28/17 0156 12/30/17 0424  WBC 18.3* 15.1* 11.9* 14.4* 12.8*  NEUTROABS 17.2*  --  10.1* 12.2*  --   HGB 9.9* 9.8* 10.3* 10.1* 10.1*  HCT 33.1* 31.8* 32.8* 32.8* 33.8*  MCV 92.5 90.3 87.0 89.4 90.4  PLT 163 139* 131* 163 274   Cardiac Enzymes: Recent Labs  Lab 12/23/17 2205 12/24/17 0342 12/24/17 0853  TROPONINI 1.55*  1.55* 1.18*   BNP: BNP (last 3 results) No results for input(s): BNP in the last 8760 hours.  ProBNP (last 3 results) No results for input(s): PROBNP in the last 8760 hours.  CBG: Recent Labs  Lab 12/29/17 1226 12/29/17 1659 12/29/17 1705 12/29/17 2051 12/30/17 0803  GLUCAP 129* 122* 114* 137* 106*       Signed:  Florencia Reasons MD, PhD  [facility] 12/30/2017, 11:55 AM

## 2017-12-30 NOTE — Progress Notes (Signed)
Cristina Gong, RN  Rehab Admission Coordinator  Physical Medicine and Rehabilitation  PMR Pre-admission  Signed  Date of Service:  12/30/2017 10:18 AM       Related encounter: ED to Hosp-Admission (Current) from 12/23/2017 in [facility]      Signed           Show:Clear all [x] Manual[x] Template[x] Copied  Added by: [x] Cristina Gong, RN   [] Hover for details   PMR Admission Coordinator Pre-Admission Assessment  Patient: LUCIA MCCREADIE is an 84 y.o., male MRN: [id number] DOB: 03-Mar-1934 Height: 6\' 3"  (190.5 cm) Weight: 94.5 kg (208 lb 5.4 oz)                                                                                                                                                  Insurance Information HMO:     PPO: yes     PCP:      IPA:      80/20:      OTHER: medicare advantage PRIMARY: [employer]      Policy#: [id number]      Subscriber: pt CM Name: Tammy     Phone#: [phone]     Fax#: Epic access Pre-Cert#: [id number]  Approved for 7 days    Employer: retired Benefits:  Phone #: [phone]    Name: 12/30/2017 Eff. Date: 06/21/2017     Deduct: none      Out of Pocket Max: $3400      Life Max: none CIR: $295 co pay per day days 1 until 6      SNF: $20 co pay per day days 1 until 20; $160 co pay per day days 21 until 100 Outpatient: $15 co pay per visit     Co-Pay: visits per medical neccesity Home Health: 100%      Co-Pay: visits per medical neccesity DME: 80%     Co-Pay: 20% Providers: in network  SECONDARY: none        Medicaid Application Date:       Case Manager:  Disability Application Date:       Case Worker:   Emergency Publishing copy Information    Name Relation Home Work Mobile   [place] Spouse [phone]  [phone]     Current Medical History  Patient Admitting Diagnosis: encephalopathy due to urosepsis  History of Present Illness:  Daveon Arpino. Ferreras is a  84 year old right-handed male with history ofprostate cancer status post radiation, psoriatic arthritis maintained on methotrexate and chronic prednisone,PAF/pacemaker maintained on Eliquisas well as Tikosyn, prior CVA, chronic systolic congestive heart failure with ejection fraction 25%, mild dementia maintained on Aricept, hypertension as well as borderline diabetes mellitus. Presented 12/23/2017 with altered mental status. In the ED noted fever as well as heart rate in the  150s. Placed on broad-spectrum antibiotics for suspected urosepsis. Chest x-ray showed some vascular congestion probable mild pulmonary edema, troponin 1.55, urine culture greater than 100,000 E. coli, lactic acid 2.6, creatinine 1.37. Patient developed V. tach arrest later that evening followed by cardiology services. Patient did receive CPR.Maintained on lidocaine drip from 7/6 to 12/27/2017. Echocardiogram ejection fraction 30% diffuse hypokinesis. Renal ultrasound no hydronephrosis. No further work-up indicated per cardiology services. CT of the abdomen with no acute findings.Patient completed 7-day course of Rocephin 12/29/2017 for treatment of bacteremia/urosepsis per ID recommendation. Follow-up blood cultures no growth. Noted stage I pressure injury presented on admission with routine skin care.   Past Medical History      Past Medical History:  Diagnosis Date  . Anxiety   . Arthritis    "hands, right hip; lower back" (02/28/2017)  . Arthritis with psoriasis ([place])   . Borderline diabetes    DIET CONTROLLED - PT STATES HE IS NOT DIABETIC  . Carotid stenosis, bilateral PER DR EARLY / DUPLEX  09-09-10     40 - 59%   BILATERALLY--- ASYMPTOMATIC  . Chronic lower back pain   . Depression   . Dysrhythmia    a-fib  . GERD (gastroesophageal reflux disease)    CONTROLLED W/ PROTONIX  . History of gout   . HTN (hypertension)   . Hyperlipidemia   . Iron deficiency   . Lumbar spondylosis W/  RADICULOPATHY  . OSA on CPAP   . PAF (paroxysmal atrial fibrillation) ([place])   . Presence of permanent cardiac pacemaker DDD-  06-04-10-   DDD; SECONDARY TO SYNCOPY AND BRADYCARDIA  . Prostate cancer ([place]) 2008   S/P 95 RADIATION  [place]    . Restless leg syndrome   . Stroke [facility]) Oct. 14, 2014   light left hand, balance issues, short term memory loss 2014  . SVT (supraventricular tachycardia) ([place])    S/P ABLATION   2008    Family History  family history includes Early death in his father; Stroke in his mother.  Prior Rehab/Hospitalizations:  Has the patient had major surgery during 100 days prior to admission? Yes  CIR 03/2013 after CVA   Current Medications   Current Facility-Administered Medications:  .  0.9 %  sodium chloride infusion, 250 mL, Intravenous, PRN, Omar Person, NP, Last Rate: 10 mL/hr at 12/28/17 1658, 20 mL at 12/28/17 1658 .  acetaminophen (TYLENOL) tablet 650 mg, 650 mg, Oral, Q6H, Aldean Jewett, MD, 650 mg at 12/30/17 0542 .  apixaban (ELIQUIS) tablet 2.5 mg, 2.5 mg, Oral, BID, Lyndee Leo, RPH, 2.5 mg at 12/30/17 0841 .  atorvastatin (LIPITOR) tablet 20 mg, 20 mg, Oral, Daily, Caren Griffins, MD, 20 mg at 12/30/17 0840 .  donepezil (ARICEPT) tablet 10 mg, 10 mg, Oral, QHS, Gherghe, Costin M, MD, 10 mg at 12/29/17 2204 .  DULoxetine (CYMBALTA) DR capsule 30 mg, 30 mg, Oral, QHS, Gherghe, Costin M, MD, 30 mg at 12/29/17 2206 .  feeding supplement (PRO-STAT SUGAR FREE 64) liquid 30 mL, 30 mL, Oral, TID, Simonne Maffucci B, MD, 30 mL at 12/30/17 0839 .  ferrous sulfate tablet 325 mg, 325 mg, Oral, BID WC, Caren Griffins, MD, 325 mg at 12/30/17 0840 .  folic acid (FOLVITE) tablet 1 mg, 1 mg, Oral, QHS, Gherghe, Costin M, MD, 1 mg at 12/29/17 2204 .  furosemide (LASIX) injection 40 mg, 40 mg, Intravenous, Daily, Aldean Jewett, MD, 40 mg at 12/30/17 0840 .  Gerhardt's butt  cream, , Topical, PRN, Aljishi, Estill Batten Z, MD .  lidocaine  (LIDODERM) 5 % 1 patch, 1 patch, Transdermal, Q24H, Means, Lolita Cram, MD, 1 patch at 12/29/17 2012 .  metoprolol succinate (TOPROL-XL) 24 hr tablet 25 mg, 25 mg, Oral, QHS, Gherghe, Costin M, MD, 25 mg at 12/29/17 2200 .  oxyCODONE-acetaminophen (PERCOCET/ROXICET) 5-325 MG per tablet 0.5-1 tablet, 0.5-1 tablet, Oral, BID PRN, Caren Griffins, MD, 1 tablet at 12/30/17 0840 .  pantoprazole (PROTONIX) EC tablet 40 mg, 40 mg, Oral, QHS, Caren Griffins, MD, 40 mg at 12/29/17 2214  Patients Current Diet:       Diet Order           Diet Heart Room service appropriate? Yes; Fluid consistency: Thin  Diet effective now          Precautions / Restrictions Precautions Precautions: Fall Precaution Comments: chest and ribs sore from CPR Restrictions Weight Bearing Restrictions: No Other Position/Activity Restrictions: self R UE limiting due to R flank pain and solid bump/mass at bottum of rib cage   Has the patient had 2 or more falls or a fall with injury in the past year?No  Prior Activity Level Limited Community (1-2x/wk): limited; does not drive; attening outpt rehab recently  Development worker, international aid / [place] Devices/Equipment: Environmental consultant (specify type), Eyeglasses, Bedside commode/3-in-1, Shower chair without back, Cane (specify quad or straight) Home Equipment: Walker - 4 wheels  Prior Device Use: Indicate devices/aids used by the patient prior to current illness, exacerbation or injury? Walker  Prior Functional Level Prior Function Level of Independence: Independent with assistive device(s) Comments: amb with rollator . Performed ADLs independently  Self Care: Did the patient need help bathing, dressing, using the toilet or eating?  Independent  Indoor Mobility: Did the patient need assistance with walking from room to room (with or without device)? Independent  Stairs: Did the patient need assistance with internal or external stairs (with or  without device)? Needed some help  Functional Cognition: Did the patient need help planning regular tasks such as shopping or remembering to take medications? Needed some help  Current Functional Level Cognition  Overall Cognitive Status: History of cognitive impairments - at baseline Orientation Level: Oriented to person, Oriented to place, Disoriented to time, Disoriented to situation General Comments: history of dementia and memory deficits. Following simple cues    Extremity Assessment (includes Sensation/Coordination)  Upper Extremity Assessment: Generalized weakness  Lower Extremity Assessment: Generalized weakness    ADLs  Overall ADL's : Needs assistance/impaired Eating/Feeding: Set up, Supervision/ safety, Sitting Grooming: Wash/dry face, Set up, Supervision/safety, Sitting Grooming Details (indicate cue type and reason): Washing face awhile seated in recliner. Requiring cues for initating and increase occupational participation Upper Body Bathing: Moderate assistance, Sitting Lower Body Bathing: Maximal assistance, Sit to/from stand Upper Body Dressing : Moderate assistance, Sitting Lower Body Dressing: Maximal assistance, Sit to/from stand Lower Body Dressing Details (indicate cue type and reason): Max A to don socks at [employer]: Moderate assistance, Stand-pivot(two person; simulated to recliner) Toilet Transfer Details (indicate cue type and reason): Mod A +2 for transition to left Toileting- Clothing Manipulation and Hygiene: Maximal assistance, +2 for safety/equipment, Bed level Toileting - Clothing Manipulation Details (indicate cue type and reason): Mod A for rolling and then Max A for toilet hygiene with BM in bed Functional mobility during ADLs: Moderate assistance, +2 for physical assistance, +2 for safety/equipment(stand pivot only) General ADL Comments: Pt with decreased balance, strength, and cognition  Mobility  Overal bed mobility: Needs  Assistance Bed Mobility: Supine to Sit Supine to sit: Max assist, HOB elevated General bed mobility comments: cues for sequencing and assist to scoot hips and elevate trunk into sitting    Transfers  Overall transfer level: Needs assistance Equipment used: Rolling walker (2 wheeled) Transfers: Sit to/from Stand, Stand Pivot Transfers Sit to Stand: +2 physical assistance, Min assist, Mod assist Stand pivot transfers: Mod assist, +2 physical assistance Squat pivot transfers: Mod assist, +2 physical assistance General transfer comment: pt stood from EOB and recliner; cues for safe hand placement and sequencing; min A +2 to power up into standing and then mod A to gain balance and upright posture upon standing     Ambulation / Gait / Stairs / Wheelchair Mobility  Ambulation/Gait Ambulation/Gait assistance: Mod assist, +2 safety/equipment(chair follow) Gait Distance (Feet): 6 Feet Assistive device: Rolling walker (2 wheeled) Gait Pattern/deviations: Step-through pattern, Decreased step length - right, Decreased step length - left, Shuffle, Trunk flexed General Gait Details: multimodal cues for posture; assist for balance and guidance of RW  Gait velocity: decreased    Posture / Balance Balance Overall balance assessment: Needs assistance Sitting-balance support: Bilateral upper extremity supported, Feet supported Sitting balance-Leahy Scale: Fair Standing balance support: Bilateral upper extremity supported Standing balance-Leahy Scale: Poor Standing balance comment: dependent on physical assist    Special needs/care consideration BiPAP/CPAP yes pta CPM n/a Continuous Drip IV n/a Dialysis  N/a Life Vest n/a Oxygen n/a Special Bed n/a Trach Size n/a Wound Vac n/a Skin ecchymosis bilateral feet and legs as well as bilateral arms.   0.2 X 0.2 cm pressure ulcer noted 7/5 red tissue buttocks stage 1 Bowel mgmt: incontinent LBM 7/10, diarrhea Bladder mgmt: indwelling  catheter Diabetic mgmt yes pta   Previous Home Environment Living Arrangements: Spouse/significant other  Lives With: Spouse Available Help at Discharge: Family, Available 24 hours/day Type of Home: House Home Layout: Able to live on main level with bedroom/bathroom Home Access: Stairs to enter Entrance Stairs-Rails: Right, Left Entrance Stairs-Number of Steps: 3 Bathroom Shower/Tub: Multimedia programmer: Programmer, systems: Yes Home Care Services: No  Discharge Living Setting Plans for Discharge Living Setting: Patient's home, Lives with (comment)(wife) Type of Home at Discharge: House Discharge Home Layout: Able to live on main level with bedroom/bathroom, Two level Discharge Home Access: Stairs to enter Entrance Stairs-Rails: Right, Left, Can reach both Entrance Stairs-Number of Steps: 3 Discharge Bathroom Shower/Tub: Walk-in shower Discharge Bathroom Toilet: Standard Discharge Bathroom Accessibility: Yes How Accessible: Accessible via walker Does the patient have any problems obtaining your medications?: No  Social/Family/Support Systems Patient Roles: Spouse Contact Information: [name], wife Anticipated Caregiver: wife Anticipated Caregiver's Contact Information: see above Caregiver Availability: 24/7 Discharge Plan Discussed with Primary Caregiver: Yes Is Caregiver In Agreement with Plan?: Yes Does Caregiver/Family have Issues with Lodging/Transportation while Pt is in Rehab?: No   Goals/Additional Needs Patient/Family Goal for Rehab: Mod I to supervision with PT, OT, and SLP Expected length of stay: ELOS 13 to 18 days Pt/Family Agrees to Admission and willing to participate: Yes Program Orientation Provided & Reviewed with Pt/Caregiver Including Roles  & Responsibilities: Yes  Decrease burden of Care through IP rehab admission: n/a  Possible need for SNF placement upon discharge: not anticipated  Patient Condition: This patient's  condition remains as documented in the consult dated  12/29/2017 in which the Rehabilitation Physician determined and documented that the patient's condition is appropriate for intensive rehabilitative care in an inpatient rehabilitation  facility. Will admit to inpatient rehab today.  Preadmission Screen Completed By:  Cleatrice Burke, 12/30/2017 11:04 AM ______________________________________________________________________   Discussed status with Dr.  Naaman Plummer on 12/30/2017 at  1105 and received telephone approval for admission today.  Admission Coordinator:  Cleatrice Burke, time 3225 Date 12/30/2017             Cosigned by: [name], MD at 12/30/2017 11:33 AM  Revision History

## 2017-12-30 NOTE — Progress Notes (Signed)
Occupational Therapy Treatment Patient Details Name: Christopher Baker MRN: [id number] DOB: 12-29-1933 Today's Date: 12/30/2017    History of present illness Christopher Baker is a 84 y.o. male with medical history significant of paroxysmal A. fib currently has a pacemaker, prior CVA, chronic systolic CHF with an EF of 25%, mild dementia, hypertension, hyperlipidemia, who is being brought to the hospital by his wife after she found him early this morning being confused and poorly responsive.    OT comments  Pt reports some nausea and lethargy this afternoon, suspect from pain meds given earlier for back pain. Pt able to follow commands to initiate tasks with min verbal/physical cues. Improved ability to use R UE per pt and his wife. Pt performed activities at bed level.   Follow Up Recommendations  CIR;Supervision/Assistance - 24 hour    Equipment Recommendations  Other (comment)(TBD at CIR)    Recommendations for Other Services Rehab consult;PT consult;Speech consult    Precautions / Restrictions Precautions Precautions: Fall Precaution Comments: chest and ribs sore from CPR Restrictions Weight Bearing Restrictions: No Other Position/Activity Restrictions: self R UE limiting due to R flank pain and solid bump/mass at bottom of rib cage       Mobility Bed Mobility Overal bed mobility: Needs Assistance             General bed mobility comments: attempted x 2, pt required min verbal/physical cues to attend to tasks in bed. Pt reports nausea and lethargy from pain meds given earlier  Transfers                 General transfer comment: NT    Balance                                           ADL either performed or assessed with clinical judgement   ADL Overall ADL's : Needs assistance/impaired     Grooming: Wash/dry face;Set up;Supervision/safety;Wash/dry hands   Upper Body Bathing: Minimal assistance;Bed level Upper Body Bathing Details  (indicate cue type and reason): simulated     Upper Body Dressing : Minimal assistance;Bed level                     General ADL Comments: pt able to follow commands to initiate tasks with min verbal/physical cues. Improved ability to use R UE per pt and his wife     Vision Baseline Vision/History: Wears glasses Wears Glasses: Reading only Patient Visual Report: No change from baseline     Perception     Praxis      Cognition Arousal/Alertness: Lethargic;Suspect due to medications Behavior During Therapy: [name] for tasks assessed/performed Overall Cognitive Status: History of cognitive impairments - at baseline                                 General Comments: history of dementia and memory deficits. Following simple cues        Exercises     Shoulder Instructions       General Comments      Pertinent Vitals/ Pain       Pain Assessment: Faces Faces Pain Scale: Hurts little more Pain Location: back Pain Descriptors / Indicators: Aching;Sore Pain Intervention(s): Premedicated before session;Monitored during session;Repositioned  Home Living  Lives With: Spouse    Prior Functioning/Environment              Frequency  Min 2X/week        Progress Toward Goals  OT Goals(current goals can now be found in the care plan section)  Progress towards OT goals: OT to reassess next treatment     Plan Discharge plan remains appropriate    Co-evaluation                 AM-PAC PT "6 Clicks" Daily Activity     Outcome Measure   Help from another person eating meals?: A Little Help from another person taking care of personal grooming?: A Little Help from another person toileting, which includes using toliet, bedpan, or urinal?: A Lot Help from another person bathing (including washing, rinsing, drying)?: A Lot Help from another person to put on and taking off regular upper body  clothing?: A Lot Help from another person to put on and taking off regular lower body clothing?: A Lot 6 Click Score: 14    End of Session    OT Visit Diagnosis: Unsteadiness on feet (R26.81);Other abnormalities of gait and mobility (R26.89);Muscle weakness (generalized) (M62.81);Pain Pain - part of body: (back)   Activity Tolerance Patient limited by fatigue;Patient limited by lethargy   Patient Left in bed;with call bell/phone within reach;with family/visitor present   Nurse Communication      Functional Assessment Tool Used: AM-PAC 6 Clicks Daily Activity   Time: 2229-7989 OT Time Calculation (min): 16 min  Charges: OT G-codes **NOT FOR INPATIENT CLASS** Functional Assessment Tool Used: AM-PAC 6 Clicks Daily Activity OT General Charges $OT Visit: 1 Visit OT Treatments $Self Care/Home Management : 8-22 mins     Britt Bottom 12/30/2017, 1:44 PM

## 2017-12-30 NOTE — PMR Pre-admission (Signed)
PMR Admission Coordinator Pre-Admission Assessment  Patient: Christopher Baker is an 84 y.o., male MRN: [id number] DOB: 07-19-1933 Height: 6\' 3"  (190.5 cm) Weight: 94.5 kg (208 lb 5.4 oz)              Insurance Information HMO:     PPO: yes     PCP:      IPA:      80/20:      OTHER: medicare advantage PRIMARY: [employer]      Policy#: [id number]      Subscriber: pt CM Name: Tammy     Phone#: [phone]     Fax#: Epic access Pre-Cert#: [id number]  Approved for 7 days    Employer: retired Benefits:  Phone #: [phone]    Name: 12/30/2017 Eff. Date: 06/21/2017     Deduct: none      Out of Pocket Max: $3400      Life Max: none CIR: $295 co pay per day days 1 until 6      SNF: $20 co pay per day days 1 until 20; $160 co pay per day days 21 until 100 Outpatient: $15 co pay per visit     Co-Pay: visits per medical neccesity Home Health: 100%      Co-Pay: visits per medical neccesity DME: 80%     Co-Pay: 20% Providers: in network  SECONDARY: none        Medicaid Application Date:       Case Manager:  Disability Application Date:       Case Worker:   Emergency Facilities manager Information    Name Relation Home Work Mobile   [place] Spouse [phone]  [phone]     Current Medical History  Patient Admitting Diagnosis: encephalopathy due to urosepsis  History of Present Illness:  Christopher Baker is a 84 year old right-handed male with history of prostate cancer status post radiation, psoriatic arthritis maintained on methotrexate and chronic prednisone, PAF/pacemaker maintained on Eliquis as well as Tikosyn, prior CVA, chronic systolic congestive heart failure with ejection fraction 25%, mild dementia maintained on Aricept, hypertension as well as borderline diabetes mellitus.   Presented 12/23/2017 with altered mental status.  In the ED noted fever as well as heart rate in the 150s.  Placed on broad-spectrum antibiotics for suspected urosepsis.  Chest x-ray showed  some vascular congestion probable mild pulmonary edema, troponin 1.55, urine culture greater than 100,000 E. coli, lactic acid 2.6, creatinine 1.37.  Patient developed V. tach arrest later that evening followed by cardiology services.  Patient did receive CPR.  Maintained on lidocaine drip from 7/6 to 12/27/2017.  Echocardiogram ejection fraction 30% diffuse hypokinesis.  Renal ultrasound no hydronephrosis.  No further work-up indicated per cardiology services.  CT of the abdomen with no acute findings.  Patient completed 7-day course of Rocephin 12/29/2017 for treatment of bacteremia/urosepsis per ID recommendation.  Follow-up blood cultures no growth.  Noted stage I pressure injury presented on admission with routine skin care.    Past Medical History  Past Medical History:  Diagnosis Date  . Anxiety   . Arthritis    "hands, right hip; lower back" (02/28/2017)  . Arthritis with psoriasis ([place])   . Borderline diabetes    DIET CONTROLLED - PT STATES HE IS NOT DIABETIC  . Carotid stenosis, bilateral PER DR EARLY / DUPLEX  09-09-10     40 - 59%   BILATERALLY--- ASYMPTOMATIC  . Chronic lower back pain   . Depression   . Dysrhythmia  a-fib  . GERD (gastroesophageal reflux disease)    CONTROLLED W/ PROTONIX  . History of gout   . HTN (hypertension)   . Hyperlipidemia   . Iron deficiency   . Lumbar spondylosis W/ RADICULOPATHY  . OSA on CPAP   . PAF (paroxysmal atrial fibrillation) ([place])   . Presence of permanent cardiac pacemaker DDD-  06-04-10-   DDD; SECONDARY TO SYNCOPY AND BRADYCARDIA  . Prostate cancer ([place]) 2008   S/P 42 RADIATION  [place]    . Restless leg syndrome   . Stroke [facility]) Oct. 14, 2014   light left hand, balance issues, short term memory loss 2014  . SVT (supraventricular tachycardia) ([place])    S/P ABLATION   2008    Family History  family history includes Early death in his father; Stroke in his mother.  Prior Rehab/Hospitalizations:  Has the patient had major surgery  during 100 days prior to admission? Yes  CIR 03/2013 after CVA   Current Medications   Current Facility-Administered Medications:  .  0.9 %  sodium chloride infusion, 250 mL, Intravenous, PRN, Omar Person, NP, Last Rate: 10 mL/hr at 12/28/17 1658, 20 mL at 12/28/17 1658 .  acetaminophen (TYLENOL) tablet 650 mg, 650 mg, Oral, Q6H, Aldean Jewett, MD, 650 mg at 12/30/17 0542 .  apixaban (ELIQUIS) tablet 2.5 mg, 2.5 mg, Oral, BID, Lyndee Leo, RPH, 2.5 mg at 12/30/17 0841 .  atorvastatin (LIPITOR) tablet 20 mg, 20 mg, Oral, Daily, Caren Griffins, MD, 20 mg at 12/30/17 0840 .  donepezil (ARICEPT) tablet 10 mg, 10 mg, Oral, QHS, Gherghe, Costin M, MD, 10 mg at 12/29/17 2204 .  DULoxetine (CYMBALTA) DR capsule 30 mg, 30 mg, Oral, QHS, Gherghe, Costin M, MD, 30 mg at 12/29/17 2206 .  feeding supplement (PRO-STAT SUGAR FREE 64) liquid 30 mL, 30 mL, Oral, TID, Simonne Maffucci B, MD, 30 mL at 12/30/17 0839 .  ferrous sulfate tablet 325 mg, 325 mg, Oral, BID WC, Caren Griffins, MD, 325 mg at 12/30/17 0840 .  folic acid (FOLVITE) tablet 1 mg, 1 mg, Oral, QHS, Gherghe, Costin M, MD, 1 mg at 12/29/17 2204 .  furosemide (LASIX) injection 40 mg, 40 mg, Intravenous, Daily, Aldean Jewett, MD, 40 mg at 12/30/17 0840 .  Gerhardt's butt cream, , Topical, PRN, Aljishi, Wael Z, MD .  lidocaine (LIDODERM) 5 % 1 patch, 1 patch, Transdermal, Q24H, Means, Lolita Cram, MD, 1 patch at 12/29/17 2012 .  metoprolol succinate (TOPROL-XL) 24 hr tablet 25 mg, 25 mg, Oral, QHS, Gherghe, Costin M, MD, 25 mg at 12/29/17 2200 .  oxyCODONE-acetaminophen (PERCOCET/ROXICET) 5-325 MG per tablet 0.5-1 tablet, 0.5-1 tablet, Oral, BID PRN, Caren Griffins, MD, 1 tablet at 12/30/17 0840 .  pantoprazole (PROTONIX) EC tablet 40 mg, 40 mg, Oral, QHS, Caren Griffins, MD, 40 mg at 12/29/17 2214  Patients Current Diet:  Diet Order           Diet Heart Room service appropriate? Yes; Fluid consistency: Thin  Diet  effective now          Precautions / Restrictions Precautions Precautions: Fall Precaution Comments: chest and ribs sore from CPR Restrictions Weight Bearing Restrictions: No Other Position/Activity Restrictions: self R UE limiting due to R flank pain and solid bump/mass at bottum of rib cage   Has the patient had 2 or more falls or a fall with injury in the past year?No  Prior Activity Level Limited Community (1-2x/wk): limited; does not  drive; attening outpt rehab recently  [place] / [place] Devices/Equipment: Environmental consultant (specify type), Eyeglasses, Bedside commode/3-in-1, Shower chair without back, Cane (specify quad or straight) Home Equipment: Walker - 4 wheels  Prior Device Use: Indicate devices/aids used by the patient prior to current illness, exacerbation or injury? Walker  Prior Functional Level Prior Function Level of Independence: Independent with assistive device(s) Comments: amb with rollator . Performed ADLs independently  Self Care: Did the patient need help bathing, dressing, using the toilet or eating?  Independent  Indoor Mobility: Did the patient need assistance with walking from room to room (with or without device)? Independent  Stairs: Did the patient need assistance with internal or external stairs (with or without device)? Needed some help  Functional Cognition: Did the patient need help planning regular tasks such as shopping or remembering to take medications? Needed some help  Current Functional Level Cognition  Overall Cognitive Status: History of cognitive impairments - at baseline Orientation Level: Oriented to person, Oriented to place, Disoriented to time, Disoriented to situation General Comments: history of dementia and memory deficits. Following simple cues    Extremity Assessment (includes Sensation/Coordination)  Upper Extremity Assessment: Generalized weakness  Lower Extremity Assessment: Generalized  weakness    ADLs  Overall ADL's : Needs assistance/impaired Eating/Feeding: Set up, Supervision/ safety, Sitting Grooming: Wash/dry face, Set up, Supervision/safety, Sitting Grooming Details (indicate cue type and reason): Washing face awhile seated in recliner. Requiring cues for initating and increase occupational participation Upper Body Bathing: Moderate assistance, Sitting Lower Body Bathing: Maximal assistance, Sit to/from stand Upper Body Dressing : Moderate assistance, Sitting Lower Body Dressing: Maximal assistance, Sit to/from stand Lower Body Dressing Details (indicate cue type and reason): Max A to don socks at [employer]: Moderate assistance, Stand-pivot(two person; simulated to recliner) Toilet Transfer Details (indicate cue type and reason): Mod A +2 for transition to left Toileting- Clothing Manipulation and Hygiene: Maximal assistance, +2 for safety/equipment, Bed level Toileting - Clothing Manipulation Details (indicate cue type and reason): Mod A for rolling and then Max A for toilet hygiene with BM in bed Functional mobility during ADLs: Moderate assistance, +2 for physical assistance, +2 for safety/equipment(stand pivot only) General ADL Comments: Pt with decreased balance, strength, and cognition    Mobility  Overal bed mobility: Needs Assistance Bed Mobility: Supine to Sit Supine to sit: Max assist, HOB elevated General bed mobility comments: cues for sequencing and assist to scoot hips and elevate trunk into sitting    Transfers  Overall transfer level: Needs assistance Equipment used: Rolling walker (2 wheeled) Transfers: Sit to/from Stand, [employer] Transfers Sit to Stand: +2 physical assistance, Min assist, Mod assist Stand pivot transfers: Mod assist, +2 physical assistance Squat pivot transfers: Mod assist, +2 physical assistance General transfer comment: pt stood from EOB and recliner; cues for safe hand placement and sequencing; min A +2 to  power up into standing and then mod A to gain balance and upright posture upon standing     Ambulation / Gait / Stairs / Wheelchair Mobility  Ambulation/Gait Ambulation/Gait assistance: Mod assist, +2 safety/equipment(chair follow) Gait Distance (Feet): 6 Feet Assistive device: Rolling walker (2 wheeled) Gait Pattern/deviations: Step-through pattern, Decreased step length - right, Decreased step length - left, Shuffle, Trunk flexed General Gait Details: multimodal cues for posture; assist for balance and guidance of RW  Gait velocity: decreased    Posture / Balance Balance Overall balance assessment: Needs assistance Sitting-balance support: Bilateral upper extremity supported, Feet supported Sitting  balance-Leahy Scale: Fair Standing balance support: Bilateral upper extremity supported Standing balance-Leahy Scale: Poor Standing balance comment: dependent on physical assist    Special needs/care consideration BiPAP/CPAP yes pta CPM n/a Continuous Drip IV n/a Dialysis  N/a Life Vest n/a Oxygen n/a Special Bed n/a Trach Size n/a Wound Vac n/a Skin ecchymosis bilateral feet and legs as well as bilateral arms.   0.2 X 0.2 cm pressure ulcer noted 7/5 red tissue buttocks stage 1 Bowel mgmt: incontinent LBM 7/10, diarrhea Bladder mgmt: indwelling catheter Diabetic mgmt yes pta   Previous Home Environment Living Arrangements: Spouse/significant other  Lives With: Spouse Available Help at Discharge: Family, Available 24 hours/day Type of Home: House Home Layout: Able to live on main level with bedroom/bathroom Home Access: Stairs to enter Entrance Stairs-Rails: Right, Left Entrance Stairs-Number of Steps: 3 Bathroom Shower/Tub: Multimedia programmer: Programmer, systems: Yes Home Care Services: No  Discharge Living Setting Plans for Discharge Living Setting: Patient's home, Lives with (comment)(wife) Type of Home at Discharge: House Discharge Home  Layout: Able to live on main level with bedroom/bathroom, Two level Discharge Home Access: Stairs to enter Entrance Stairs-Rails: Right, Left, Can reach both Entrance Stairs-Number of Steps: 3 Discharge Bathroom Shower/Tub: Walk-in shower Discharge Bathroom Toilet: Standard Discharge Bathroom Accessibility: Yes How Accessible: Accessible via walker Does the patient have any problems obtaining your medications?: No  Social/Family/Support Systems Patient Roles: Spouse Contact Information: [name], wife Anticipated Caregiver: wife Anticipated Caregiver's Contact Information: see above Caregiver Availability: 24/7 Discharge Plan Discussed with Primary Caregiver: Yes Is Caregiver In Agreement with Plan?: Yes Does Caregiver/Family have Issues with Lodging/Transportation while Pt is in Rehab?: No   Goals/Additional Needs Patient/Family Goal for Rehab: Mod I to supervision with PT, OT, and SLP Expected length of stay: ELOS 13 to 18 days Pt/Family Agrees to Admission and willing to participate: Yes Program Orientation Provided & Reviewed with Pt/Caregiver Including Roles  & Responsibilities: Yes  Decrease burden of Care through IP rehab admission: n/a  Possible need for SNF placement upon discharge: not anticipated  Patient Condition: This patient's condition remains as documented in the consult dated  12/29/2017 in which the Rehabilitation Physician determined and documented that the patient's condition is appropriate for intensive rehabilitative care in an inpatient rehabilitation facility. Will admit to inpatient rehab today.  Preadmission Screen Completed By:  Cleatrice Burke, 12/30/2017 11:04 AM ______________________________________________________________________   Discussed status with Dr.  Naaman Plummer on 12/30/2017 at  1105 and received telephone approval for admission today.  Admission Coordinator:  Cleatrice Burke, time 9741 Date 12/30/2017

## 2017-12-30 NOTE — H&P (Signed)
Physical Medicine and Rehabilitation Admission H&P     Chief Complaint  Patient presents with  . Urinary Tract Infection  :  HPI: Christopher Baker is a 84 year old right-handed male with history of prostate cancer status post radiation, psoriatic arthritis maintained on methotrexate and chronic prednisone, PAF/pacemaker maintained on Eliquis as well as Tikosyn, prior CVA, chronic systolic congestive heart failure with ejection fraction 25%, mild dementia maintained on Aricept, hypertension as well as borderline diabetes mellitus. Per chart review patient lives with his wife. Used a rolling walker prior to admission. Reports patient had been essentially bedbound for the past week but was able to stand and complete a stand pivot transfer. Presented 12/23/2017 with altered mental status. In the ED noted fever as well as heart rate in the 150s. Placed on broad-spectrum antibiotics for suspected urosepsis. Chest x-ray showed some vascular congestion probable mild pulmonary edema, troponin 1.55, urine culture greater than 100,000 E. coli, lactic acid 2.6, creatinine 1.37. Patient developed V. tach arrest later that evening followed by cardiology services. Patient did receive CPR. Maintained on lidocaine drip from 7/6 to 12/27/2017. Echocardiogram ejection fraction 30% diffuse hypokinesis. Renal ultrasound no hydronephrosis. No further work-up indicated per cardiology services. CT of the abdomen with no acute findings. Patient completed 7-day course of Rocephin 12/29/2017 for treatment of bacteremia/urosepsis per ID recommendation. Follow-up blood cultures no growth. Noted stage I pressure injury presented on admission with routine skin care. Physical and occupational therapy evaluations completed with recommendations of physical medicine rehab consult. This morning patient received oxycodone for low back pain and developed dizziness/nausea/difficulty staying awake. Wife states she has used 1/2 to 1/4 tab at home before  for severe pain only. Patient was admitted for a comprehensive rehab program.  Review of Systems  Constitutional: Positive for fever.  HENT: Negative for hearing loss.  Eyes: Negative for blurred vision and double vision.  Respiratory: Positive for shortness of breath.  Cardiovascular: Positive for leg swelling.  Gastrointestinal: Positive for constipation. Negative for nausea.  Musculoskeletal: Positive for joint pain.  Skin: Negative for rash.  Psychiatric/Behavioral: Positive for memory loss.  All other systems reviewed and are negative.       Past Medical History:  Diagnosis Date  . Anxiety   . Arthritis    "hands, right hip; lower back" (02/28/2017)  . Arthritis with psoriasis ([place])   . Borderline diabetes    DIET CONTROLLED - PT STATES HE IS NOT DIABETIC  . Carotid stenosis, bilateral PER DR EARLY / DUPLEX 09-09-10   40 - 59% BILATERALLY--- ASYMPTOMATIC  . Chronic lower back pain   . Depression   . Dysrhythmia    a-fib  . GERD (gastroesophageal reflux disease)    CONTROLLED W/ PROTONIX  . History of gout   . HTN (hypertension)   . Hyperlipidemia   . Iron deficiency   . Lumbar spondylosis W/ RADICULOPATHY  . OSA on CPAP   . PAF (paroxysmal atrial fibrillation) ([place])   . Presence of permanent cardiac pacemaker DDD- 06-04-10-   DDD; SECONDARY TO SYNCOPY AND BRADYCARDIA  . Prostate cancer ([place]) 2008   S/P 75 RADIATION [place]   . Restless leg syndrome   . Stroke [facility]) Oct. 14, 2014   light left hand, balance issues, short term memory loss 2014  . SVT (supraventricular tachycardia) ([place])    S/P ABLATION 2008        Past Surgical History:  Procedure Laterality Date  . BACK SURGERY    . BIV UPGRADE N/A 09/07/2017  Procedure: BIV PACEMAKER UPGRADE; Surgeon: Evans Lance, MD; Location: [place] [facility]; Service: Cardiovascular; Laterality: N/A;  . BREAST SURGERY     "removed tumor; don't remember which side; years ago" (02/28/2017)  . CARDIAC ELECTROPHYSIOLOGY STUDY  AND ABLATION  2008   FOR SVT  . CARDIAC PACEMAKER PLACEMENT  06-04-10   DDD  . CARDIOVERSION N/A 03/02/2017   Procedure: CARDIOVERSION; Surgeon: Josue Hector, MD; Location: [facility]; Service: Cardiovascular; Laterality: N/A;  . CATARACT EXTRACTION W/ INTRAOCULAR LENS IMPLANT, BILATERAL Bilateral   . CHOLECYSTECTOMY  2009  . ESOPHAGOGASTRODUODENOSCOPY N/A 12/12/2014   Procedure: ESOPHAGOGASTRODUODENOSCOPY (EGD); Surgeon: Wilford Corner, MD; Location: [facility] ENDOSCOPY; Service: Endoscopy; Laterality: N/A;  . INCISION AND DRAINAGE Right 1997   "knee replacement got infected"  . INGUINAL HERNIA REPAIR Left 06/2003   DONE WITH PENILE PROSTESIS SURG.  . INGUINAL HERNIA REPAIR Right 1962  . JOINT REPLACEMENT    . KNEE ARTHROSCOPY  06/02/2011   Procedure: ARTHROSCOPY KNEE; Surgeon: Gearlean Alf; Location: [place]; Service: Orthopedics; Laterality: Left; LEFT KNEE ARTHROSCOPY WITH DEBRIDEMENT  . LOOP RECORDER PLACEMENT  01-30-10  . LOOP RECORDER REMOVAL  06/04/2010  . LUMBAR LAMINECTOMY/ DISKECTOMY/ FUSION  05-19-10   L4 - 5  . PENILE PROSTHESIS IMPLANT  06/2003   AND LEFT CORPOROPLASTY / DONE INGUINAL REPAIR  . PROSTATE BIOPSY  2007  . REVISION TOTAL KNEE ARTHROPLASTY Right 1997  . TOTAL KNEE ARTHROPLASTY  12/20/2011   Procedure: TOTAL KNEE ARTHROPLASTY; Surgeon: Gearlean Alf, MD; Location: [facility]; Service: Orthopedics; Laterality: Left;  . TOTAL KNEE ARTHROPLASTY Right 1997  . TOTAL SHOULDER ARTHROPLASTY Right 06/27/2015   Procedure: REVERSE TOTAL SHOULDER ARTHROPLASTY; Surgeon: Netta Cedars, MD; Location: [place]; Service: Orthopedics; Laterality: Right;  . TRANSURETHRAL RESECTION OF BLADDER TUMOR WITH GYRUS (TURBT-GYRUS)  ?2018  . TRANSURETHRAL RESECTION OF PROSTATE  2006        Family History  Problem Relation Age of Onset  . Stroke Mother   . Early death Father    Social History: reports that he quit smoking about 19 years ago. His smoking use included  cigarettes. He has a 45.00 pack-year smoking history. He has never used smokeless tobacco. He reports that he drinks alcohol. He reports that he does not use drugs.  Allergies:       Allergies  Allergen Reactions  . Ciprofloxacin Nausea Only and Other (See Comments)    Stomach ache  . Hydrocodone-Acetaminophen Other (See Comments)    Hallucinations in higher doses  . Other Other (See Comments)    Rash from neoprene wrap after knee surgery  . Shellfish Allergy Nausea And Vomiting and Other (See Comments)    Reaction to scallops  . Tramadol Other (See Comments)    Pt becomes combative per wife         Medications Prior to Admission  Medication Sig Dispense Refill  . acetaminophen (TYLENOL) 500 MG tablet Take 1,000 mg by mouth 2 (two) times daily.    Marland Kitchen amoxicillin (AMOXIL) 500 MG capsule Take 2,000 mg by mouth See admin instructions. Take 2000 mg by mouth one hour prior to dental apointments    . Artificial Tear Ointment (LUBRICANT EYE OP) Place 2 drops into both eyes daily as needed (for dry eyes).    Marland Kitchen aspirin 325 MG tablet Take 325 mg by mouth once.    Marland Kitchen atorvastatin (LIPITOR) 20 MG tablet Take 20 mg by mouth daily.    . cyanocobalamin (,VITAMIN B-12,) 1000 MCG/ML injection  Inject 1,000 mcg into the muscle every 30 (thirty) days.     Marland Kitchen dofetilide (TIKOSYN) 500 MCG capsule Take 1 capsule (500 mcg total) by mouth 2 (two) times daily. (Patient taking differently: Take 500 mcg by mouth every 12 (twelve) hours. Takes between 10:00 and 11:00 in the AM & PM) 180 capsule 3  . donepezil (ARICEPT) 10 MG tablet TAKE ONE TABLET BY MOUTH EVERY NIGHT AT BEDTIME 90 tablet 1  . DULoxetine (CYMBALTA) 30 MG capsule Take 1 capsule (30 mg total) by mouth daily. (Patient taking differently: Take 30 mg by mouth at bedtime. ) 30 capsule 0  . ELIQUIS 5 MG TABS tablet TAKE 1 TABLET (5 MG TOTAL) BY MOUTH 2 (TWO) TIMES DAILY. 60 tablet 8  . ezetimibe (ZETIA) 10 MG tablet Take 1 tablet (10 mg total) by mouth daily.  90 tablet 1  . ferrous sulfate 325 (65 FE) MG tablet Take 325 mg by mouth 2 (two) times daily with a meal.     . folic acid (FOLVITE) 1 MG tablet Take 1 mg by mouth at bedtime.     . furosemide (LASIX) 40 MG tablet Take 80 mg by mouth daily.     Marland Kitchen lisinopril (PRINIVIL,ZESTRIL) 5 MG tablet TAKE ONE TABLET BY MOUTH DAILY (Patient taking differently: TAKE ONE TABLET BY MOUTH in the evening) 90 tablet 2  . loperamide (IMODIUM A-D) 2 MG tablet Take 2 mg by mouth 3 (three) times daily as needed for diarrhea or loose stools.     . methotrexate (RHEUMATREX) 2.5 MG tablet Take 15 mg by mouth every Monday.     . metoprolol succinate (TOPROL XL) 25 MG 24 hr tablet Take 1 tablet (25 mg total) by mouth daily. (Patient taking differently: Take 25 mg by mouth at bedtime. ) 90 tablet 2  . oxyCODONE-acetaminophen (PERCOCET) 10-325 MG tablet Take 0.25-0.5 tablets by mouth 2 (two) times daily as needed for pain.     . pantoprazole (PROTONIX) 40 MG tablet Take 1 tablet (40 mg total) by mouth daily. (Patient taking differently: Take 40 mg by mouth at bedtime. ) 90 tablet 3  . predniSONE (DELTASONE) 5 MG tablet Take 5 mg by mouth daily.    . tamsulosin (FLOMAX) 0.4 MG CAPS capsule Take 0.4 mg by mouth daily.    . Vitamin D, Ergocalciferol, (DRISDOL) 50000 units CAPS capsule Take 50,000 Units by mouth every Sunday.     . DULoxetine (CYMBALTA) 30 MG capsule Take 1 capsule (30 mg total) by mouth daily. (Patient not taking: Reported on 12/23/2017) 30 capsule 5   Drug Regimen Review  Drug regimen was reviewed and remains appropriate with no significant issues identified  Home:  Home Living  Family/patient expects to be discharged to:: Private residence  Living Arrangements: Spouse/significant other  Available Help at Discharge: Family, Available 24 hours/day  Type of Home: House  Home Access: Stairs to enter  [employer] of Steps: 3  Entrance Stairs-Rails: Right, Left  Home Layout: Able to live on main level  with bedroom/bathroom  Bathroom Shower/Tub: Tourist information centre manager: Standard  Bathroom Accessibility: Yes  Home Equipment: Environmental consultant - 4 wheels  Functional History:  Prior Function  Level of Independence: Independent with assistive device(s)  Comments: amb with rollator . Performed ADLs independently  Functional Status:  Mobility:  Bed Mobility  Overal bed mobility: Needs Assistance  Bed Mobility: Supine to Sit  Supine to sit: Max assist, HOB elevated  General bed mobility comments: cues for sequencing and  assist to scoot hips and elevate trunk into sitting  Transfers  Overall transfer level: Needs assistance  Equipment used: Rolling walker (2 wheeled)  Transfers: Sit to/from Stand, Stand Pivot Transfers  Sit to Stand: +2 physical assistance, Min assist, Mod assist  Stand pivot transfers: Mod assist, +2 physical assistance  Squat pivot transfers: Mod assist, +2 physical assistance  General transfer comment: pt stood from EOB and recliner; cues for safe hand placement and sequencing; min A +2 to power up into standing and then mod A to gain balance and upright posture upon standing  Ambulation/Gait  Ambulation/Gait assistance: Mod assist, +2 safety/equipment(chair follow)  Gait Distance (Feet): 6 Feet  Assistive device: Rolling walker (2 wheeled)  Gait Pattern/deviations: Step-through pattern, Decreased step length - right, Decreased step length - left, Shuffle, Trunk flexed  General Gait Details: multimodal cues for posture; assist for balance and guidance of RW  Gait velocity: decreased   ADL:  ADL  Overall ADL's : Needs assistance/impaired  Eating/Feeding: Set up, Supervision/ safety, Sitting  Grooming: Wash/dry face, Set up, Supervision/safety, Sitting  Grooming Details (indicate cue type and reason): Washing face awhile seated in recliner. Requiring cues for initating and increase occupational participation  Upper Body Bathing: Moderate assistance, Sitting  Lower Body  Bathing: Maximal assistance, Sit to/from stand  Upper Body Dressing : Moderate assistance, Sitting  Lower Body Dressing: Maximal assistance, Sit to/from stand  Lower Body Dressing Details (indicate cue type and reason): Max A to don socks at [employer]: Moderate assistance, Stand-pivot(two person; simulated to recliner)  Toilet Transfer Details (indicate cue type and reason): Mod A +2 for transition to left  Toileting- Clothing Manipulation and Hygiene: Maximal assistance, +2 for safety/equipment, Bed level  Toileting - Clothing Manipulation Details (indicate cue type and reason): Mod A for rolling and then Max A for toilet hygiene with BM in bed  Functional mobility during ADLs: Moderate assistance, +2 for physical assistance, +2 for safety/equipment(stand pivot only)  General ADL Comments: Pt with decreased balance, strength, and cognition  Cognition:  Cognition  Overall Cognitive Status: History of cognitive impairments - at baseline  Orientation Level: Oriented to person, Oriented to place, Disoriented to time, Disoriented to situation  Cognition  Arousal/Alertness: Awake/alert  Behavior During Therapy: WFL for tasks assessed/performed  Overall Cognitive Status: History of cognitive impairments - at baseline  General Comments: history of dementia and memory deficits. Following simple cues  Physical Exam:  Blood pressure 133/71, pulse 73, temperature 97.7 F (36.5 C), resp. rate 18, height 6\' 3"  (1.905 m), weight 94.5 kg (208 lb 5.4 oz), SpO2 95 %.  Physical Exam  Vitals reviewed.  Constitutional: No distress.  HENT:  Head: Normocephalic and atraumatic.  Eyes: Pupils are equal, round, and reactive to light. EOM are normal.  Neck: Normal range of motion. Neck supple.  Cardiovascular: Normal rate.  Murmur heard.  Respiratory: Effort normal. No respiratory distress. He has no wheezes. He has no rales.  GI: Soft. He exhibits no distension. There is no tenderness.    Neurological: A cranial nerve deficit is present.  Patient provides his name and age. More lethargic today, slow to arouse. Follows simple commands. UE 2-3/5 prox to distal. LE: 2 /5 prox to 3/5 distally. Senses pai nin all 4's.  Psychiatric:  Flat, sedated appearing   Lab Results Last 48 Hours  Imaging Results (Last 48 hours)     Medical Problem List and Plan:  1. Decreased functional mobility secondary to encephalopathy due to urosepsis/multi-medical  -admit to inpatient rehab  2. DVT Prophylaxis/Anticoagulation: Eliquis. Monitor for any bleeding episodes  3. Pain Management: Lidoderm patch  -avoid oxycodone due to oversedation  -schedule tylenol 650mg  qid  -heat/ice prn  -consider low dose tramadol for severe pain  4. Mood: Aricept 10 mg nightly, Cymbalta 30 mg nightly  5. Neuropsych: This patient is capable of making decisions on his own behalf.  6. Skin/Wound Care: Stage I sacral pressure injury. Routine skin checks  7. Fluids/Electrolytes/Nutrition: Routine in and outs with follow-up chemistries  8. ID. IV Rocephin completed 12/29/2017.  9. V. tach cardiac arrest with history of PAF/pacemaker. Patient currently off Tikosyn until follow-up outpatient with cardiology services.  10. Chronic systolic congestive heart  failure. Monitor for any signs of fluid overload. Lasix 40 mg daily  -will change this to nights given bladder issues  11. Psoriatic arthritis. Methotrexate and chronic prednisone currently on hold and Question Resume  12. Hypertension. Toprol-XL 25 mg daily. Monitor with increased mobility  13. Hyperlipidemia. Lipitor  14. CKD stage III. Follow-up chemistries  15. History of prostate cancer status post radiation. Patient on Flomax prior to admission 0.4 mg daily. Renal ultrasound no hydronephrosis. Follow-up outpatient Dr. Amalia Hailey  -will use condom catheter at nights for continence and urinary frequency which has affected sleep previously  16. Acute on chronic anemia. Continue iron supplement. Follow-up CBC    Post Admission Physician Evaluation:  1. Functional deficits secondary to debility/encephalopathy. 2. Patient is admitted to receive collaborative, interdisciplinary care between the physiatrist, rehab nursing staff, and therapy team. 3. Patient's level of medical complexity and substantial therapy needs in context of that medical necessity cannot be provided at a lesser intensity of care such as a SNF. 4. Patient has experienced substantial functional loss from his/her baseline which was documented above under the "Functional History" and "Functional Status" headings. Judging by the patient's diagnosis, physical exam, and functional history, the patient has potential for functional progress which will result in measurable gains while on inpatient rehab. These gains will be of substantial and practical use upon discharge in facilitating mobility and self-care at the household level. 5. Physiatrist will provide 24 hour management of medical needs as well as oversight of the therapy plan/treatment and provide guidance as appropriate regarding the interaction of the two. 6. The Preadmission Screening has been reviewed and patient status is unchanged unless otherwise stated above. 7. 24 hour rehab  nursing will assist with bladder management, bowel management, safety, skin/wound care, disease management, medication administration, pain management and patient education and help integrate therapy concepts, techniques,education, etc. 8. PT will assess and treat for/with: Lower extremity strength, range of motion, stamina, balance, functional mobility, safety, adaptive techniques and equipment, NMR, family education, pain control. Goals are: mod I to supervision. 9. OT will assess and treat for/with: ADL's, functional mobility, safety, upper extremity strength, adaptive techniques and equipment, NMR, family ed. Goals are: mod I to supervision. Therapy may proceed with showering this patient. 10. SLP will assess and treat for/with: cognition and communication. Goals are: mod I. 11. Case Management and Social Worker will assess and treat for psychological issues and discharge planning. 12. Team conference will be held weekly to assess progress toward goals and to determine barriers to discharge. 13. Patient will receive at least 3 hours of therapy per day at least 5 days per week.  14. ELOS: 13-18 days  15. Prognosis: excellent    I have personally performed a face to face diagnostic evaluation of this patient and formulated the key components of the plan. Additionally, I have personally reviewed laboratory data, imaging studies, as well as relevant notes and concur with the physician assistant's documentation above.  [name], MD, FAAPMR  Lavon Paganini [place], PA-C  12/30/2017

## 2017-12-30 NOTE — Progress Notes (Deleted)
Christopher Staggers, MD      Christopher Staggers, MD  Physician  Physical Medicine and Rehabilitation      Consult Note  Signed     Date of Service:  12/29/2017  7:58 AM         Related encounter: ED to Hosp-Admission (Current) from 12/23/2017 in [facility]             Signed          Expand All Collapse All            Expand widget buttonCollapse widget button    Show:Clear all   ManualTemplateCopied  Added by:     Angiulli, Lavon Paganini, PA-C  Christopher Staggers, MD   Hover for detailscustomization button                                                                                                                                               untitled image              Physical Medicine and Rehabilitation Consult  Reason for Consult: Decreased functional mobility  Referring Physician: Family medicine        HPI: Christopher Baker is a 84 y.o. right-handed male with history of PAF/pacemaker maintained on Eliquis, prior CVA, chronic systolic congestive heart failure with EF of 25%, mild dementia maintained on Aricept, hypertension as well as borderline diabetes mellitus.  Per chart review patient lives with wife.  Used a rolling walker prior to admission.  Reports patient had been essentially bedbound for the past week he was able to stand and complete a stand pivot transfer.  Presented 12/23/2017 with altered mental status.  In the ED noted fever as well as heart rate in the 150s.  Placed on broad-spectrum antibiotics for suspected urosepsis.  Chest x-ray showed some vascular congestion probable mild pulmonary edema, troponin 1.55, urine culture 100,000 E. coli, lactic acid 2.6, creatinine 1.37.  Patient developed V. tach arrest later that evening  follow-up per cardiology services.  Patient did receive CPR.  Echocardiogram 12/25/2017 ejection fraction of 30% diffuse hypokinesis.  Renal ultrasound with no hydronephrosis.  CT of the abdomen with no acute findings.  Therapy evaluations completed with recommendations of physical medicine rehab consult.        Review of Systems   Constitutional: Positive for fever.   HENT: Negative for hearing loss.    Eyes: Negative for blurred vision and double vision.   Respiratory: Positive for shortness of breath.    Cardiovascular: Positive for leg swelling.   Gastrointestinal: Positive for constipation. Negative for nausea and vomiting.   Genitourinary: Negative for dysuria, flank pain and hematuria.   Musculoskeletal: Positive for joint pain and myalgias.  Skin: Negative for rash.   Neurological: Negative for seizures.   Psychiatric/Behavioral: Positive for memory loss.        Anxiety   All other systems reviewed and are negative.          Past Medical History:    Diagnosis   Date    .   Anxiety        .   Arthritis            "hands, right hip; lower back" (02/28/2017)    .   Arthritis with psoriasis ([place])        .   Borderline diabetes            DIET CONTROLLED - PT STATES HE IS NOT DIABETIC    .   Carotid stenosis, bilateral   PER DR EARLY / DUPLEX  09-09-10          40 - 59%   BILATERALLY--- ASYMPTOMATIC    .   Chronic lower back pain        .   Depression        .   Dysrhythmia            a-fib    .   GERD (gastroesophageal reflux disease)            CONTROLLED W/ PROTONIX    .   History of gout        .   HTN (hypertension)        .   Hyperlipidemia        .   Iron deficiency        .   Lumbar spondylosis   W/ RADICULOPATHY    .   OSA on CPAP        .   PAF (paroxysmal atrial fibrillation) ([place])        .   Presence of permanent cardiac  pacemaker   DDD-  06-04-10-        DDD; SECONDARY TO SYNCOPY AND BRADYCARDIA    .   Prostate cancer ([place])   2008        S/P 70 RADIATION  [place]      .   Restless leg syndrome        .   Stroke [facility])   Oct. 14, 2014        light left hand, balance issues, short term memory loss 2014    .   SVT (supraventricular tachycardia) ([place])            S/P ABLATION   2008             Past Surgical History:    Procedure   Laterality   Date    .   BACK SURGERY            .   BIV UPGRADE   N/A   09/07/2017        Procedure: BIV PACEMAKER UPGRADE;  Surgeon: Evans Lance, MD;  Location: [facility];  Service: Cardiovascular;  Laterality: N/A;    .   BREAST SURGERY                "removed tumor; don't remember which side; years ago" (02/28/2017)    .   CARDIAC ELECTROPHYSIOLOGY STUDY AND ABLATION       2008        FOR SVT    .   CARDIAC PACEMAKER PLACEMENT  06-04-10        DDD    .   CARDIOVERSION   N/A   03/02/2017        Procedure: CARDIOVERSION;  Surgeon: Josue Hector, MD;  Location: [facility] ENDOSCOPY;  Service: Cardiovascular;  Laterality: N/A;    .   CATARACT EXTRACTION W/ INTRAOCULAR LENS  IMPLANT, BILATERAL   Bilateral        .   CHOLECYSTECTOMY       2009    .   ESOPHAGOGASTRODUODENOSCOPY   N/A   12/12/2014        Procedure: ESOPHAGOGASTRODUODENOSCOPY (EGD);  Surgeon: Wilford Corner, MD;  Location: [facility] ENDOSCOPY;  Service: Endoscopy;  Laterality: N/A;    .   INCISION AND DRAINAGE   Right   1997        "knee replacement got infected"    .   INGUINAL HERNIA REPAIR   Left   06/2003         DONE WITH PENILE PROSTESIS SURG.    .   INGUINAL HERNIA REPAIR   Right   1962    .   JOINT REPLACEMENT            .   KNEE ARTHROSCOPY       06/02/2011        Procedure: ARTHROSCOPY KNEE;  Surgeon: Gearlean Alf;  Location: [place];  Service: Orthopedics;  Laterality: Left;  LEFT KNEE ARTHROSCOPY WITH DEBRIDEMENT    .   LOOP RECORDER PLACEMENT       01-30-10    .   LOOP RECORDER REMOVAL       06/04/2010    .   LUMBAR LAMINECTOMY/ DISKECTOMY/ FUSION       05-19-10        L4 - 5    .   PENILE PROSTHESIS IMPLANT       06/2003        AND LEFT CORPOROPLASTY /    DONE INGUINAL REPAIR    .   PROSTATE BIOPSY       2007    .   REVISION TOTAL KNEE ARTHROPLASTY   Right   1997    .   TOTAL KNEE ARTHROPLASTY       12/20/2011        Procedure: TOTAL KNEE ARTHROPLASTY;  Surgeon: Gearlean Alf, MD;  Location: [facility];  Service: Orthopedics;  Laterality: Left;    .   TOTAL KNEE ARTHROPLASTY   Right   1997    .   TOTAL SHOULDER ARTHROPLASTY   Right   06/27/2015        Procedure: REVERSE TOTAL SHOULDER ARTHROPLASTY;  Surgeon: Netta Cedars, MD;  Location: [facility];  Service: Orthopedics;  Laterality: Right;    .   TRANSURETHRAL RESECTION OF BLADDER TUMOR WITH GYRUS (TURBT-GYRUS)       ?2018    .   TRANSURETHRAL RESECTION OF PROSTATE       2006             Family History    Problem   Relation   Age of Onset    .   Stroke   Mother        .   Early death   Father           Social History:  reports that he quit smoking about 19 years ago. His smoking use included cigarettes. He has a 45.00 pack-year smoking history.  He has never used smokeless tobacco. He reports that he drinks alcohol. He reports that he does not use drugs.  Allergies:         Allergies    Allergen   Reactions    .   Ciprofloxacin   Nausea Only and Other (See Comments)            Stomach ache    .   Hydrocodone-Acetaminophen   Other (See Comments)            Hallucinations in higher doses    .   Other   Other (See Comments)            Rash from neoprene  wrap after knee surgery    .   Shellfish Allergy   Nausea And Vomiting and Other (See Comments)            Reaction to scallops    .   Tramadol   Other (See Comments)            Pt becomes combative per wife              Medications Prior to Admission    Medication   Sig   Dispense   Refill    .   acetaminophen (TYLENOL) 500 MG tablet   Take 1,000 mg by mouth 2 (two) times daily.            Marland Kitchen   amoxicillin (AMOXIL) 500 MG capsule   Take 2,000 mg by mouth See admin instructions. Take 2000 mg by mouth one hour prior to dental apointments            .   Artificial Tear Ointment (LUBRICANT EYE OP)   Place 2 drops into both eyes daily as needed (for dry eyes).            Marland Kitchen   aspirin 325 MG tablet   Take 325 mg by mouth once.            Marland Kitchen   atorvastatin (LIPITOR) 20 MG tablet   Take 20 mg by mouth daily.            .   cyanocobalamin (,VITAMIN B-12,) 1000 MCG/ML injection   Inject 1,000 mcg into the muscle every 30 (thirty) days.             Marland Kitchen   dofetilide (TIKOSYN) 500 MCG capsule   Take 1 capsule (500 mcg total) by mouth 2 (two) times daily. (Patient taking differently: Take 500 mcg by mouth every 12 (twelve) hours. Takes between 10:00 and 11:00 in the AM & PM)   180 capsule   3    .   donepezil (ARICEPT) 10 MG tablet   TAKE ONE TABLET BY MOUTH EVERY NIGHT AT BEDTIME   90 tablet   1    .   DULoxetine (CYMBALTA) 30 MG capsule   Take 1 capsule (30 mg total) by mouth daily. (Patient taking differently: Take 30 mg by mouth at bedtime. )   30 capsule   0    .   ELIQUIS 5 MG TABS tablet   TAKE 1 TABLET (5 MG TOTAL) BY MOUTH 2 (TWO) TIMES DAILY.   60 tablet   8    .   ezetimibe (ZETIA) 10 MG tablet   Take 1 tablet (10 mg total) by mouth daily.   90 tablet   1    .   ferrous sulfate 325 (65 FE) MG tablet  Take 325 mg by mouth 2 (two) times daily with a meal.              .   folic acid (FOLVITE) 1 MG tablet   Take 1 mg by mouth at bedtime.             .   furosemide (LASIX) 40 MG tablet   Take 80 mg by mouth daily.             Marland Kitchen   lisinopril (PRINIVIL,ZESTRIL) 5 MG tablet   TAKE ONE TABLET BY MOUTH DAILY (Patient taking differently: TAKE ONE TABLET BY MOUTH in the evening)   90 tablet   2    .   loperamide (IMODIUM A-D) 2 MG tablet   Take 2 mg by mouth 3 (three) times daily as needed for diarrhea or loose stools.             .   methotrexate (RHEUMATREX) 2.5 MG tablet   Take 15 mg by mouth every Monday.             .   metoprolol succinate (TOPROL XL) 25 MG 24 hr tablet   Take 1 tablet (25 mg total) by mouth daily. (Patient taking differently: Take 25 mg by mouth at bedtime. )   90 tablet   2    .   oxyCODONE-acetaminophen (PERCOCET) 10-325 MG tablet   Take 0.25-0.5 tablets by mouth 2 (two) times daily as needed for pain.             .   pantoprazole (PROTONIX) 40 MG tablet   Take 1 tablet (40 mg total) by mouth daily. (Patient taking differently: Take 40 mg by mouth at bedtime. )   90 tablet   3    .   predniSONE (DELTASONE) 5 MG tablet   Take 5 mg by mouth daily.            .   tamsulosin (FLOMAX) 0.4 MG CAPS capsule   Take 0.4 mg by mouth daily.            .   Vitamin D, Ergocalciferol, (DRISDOL) 50000 units CAPS capsule   Take 50,000 Units by mouth every Sunday.             .   DULoxetine (CYMBALTA) 30 MG capsule   Take 1 capsule (30 mg total) by mouth daily. (Patient not taking: Reported on 12/23/2017)   30 capsule   5          Home:  Home Living  Family/patient expects to be discharged to:: Private residence  Living Arrangements: Spouse/significant other  Available Help at Discharge: Family, Available 24 hours/day  Type of Home: House  Home Access: Stairs to enter  [employer] of Steps: 3  Entrance  Stairs-Rails: Right, Left  Home Layout: Able to live on main level with bedroom/bathroom  Bathroom Shower/Tub: Tourist information centre manager: Standard  Bathroom Accessibility: Yes  Home Equipment: Environmental consultant - 4 wheels   Functional History:  Prior Function  Level of Independence: Independent with assistive device(s)  Comments: amb with rollator . Performed ADLs independently  Functional Status:   Mobility:  Bed Mobility  Overal bed mobility: Needs Assistance  Bed Mobility: Supine to Sit  Supine to sit: Max assist, HOB elevated  General bed mobility comments: pt with R flank pain, initiated LE movement to EOB, due to chest/flank pain, maxA for trunk elevation required  Transfers  Overall transfer level: Needs  assistance  Equipment used: 2 person hand held assist  Transfers: Sit to/from Stand, [employer] Transfers  Sit to Stand: Mod assist, +2 physical assistance  Stand pivot transfers: Mod assist, +2 physical assistance  Squat pivot transfers: Mod assist, +2 physical assistance  General transfer comment: pt able to push up to erect posture, modA to maintain balance, pt weak in bilat knees with buckling, modAx2 to sequence stepping to the L, pt with decreased R LE WBing due to R hip pain. modA for walker management  Ambulation/Gait  General Gait Details: took 5 steps to chair during std pvt  with RW       ADL:  ADL  Overall ADL's : Needs assistance/impaired  Eating/Feeding: Set up, Supervision/ safety, Sitting  Grooming: Wash/dry face, Set up, Supervision/safety, Sitting  Grooming Details (indicate cue type and reason): Washing face awhile seated in recliner. Requiring cues for initating and increase occupational participation  Upper Body Bathing: Moderate assistance, Sitting  Lower Body Bathing: Maximal assistance, Sit to/from stand  Upper Body Dressing : Moderate assistance, Sitting  Lower Body Dressing: Maximal assistance, Sit to/from  stand  Lower Body Dressing Details (indicate cue type and reason): Max A to don socks at [employer]: Moderate assistance, Stand-pivot(two person; simulated to recliner)  Toilet Transfer Details (indicate cue type and reason): Mod A +2 for transition to left  Toileting- Clothing Manipulation and Hygiene: Maximal assistance, +2 for safety/equipment, Bed level  Toileting - Clothing Manipulation Details (indicate cue type and reason): Mod A for rolling and then Max A for toilet hygiene with BM in bed  Functional mobility during ADLs: Moderate assistance, +2 for physical assistance, +2 for safety/equipment(stand pivot only)  General ADL Comments: Pt with decreased balance, strength, and cognition     Cognition:  Cognition  Overall Cognitive Status: Within Functional Limits for tasks assessed  Orientation Level: Oriented to person, Oriented to place, Disoriented to time, Appropriate for developmental age, Oriented to situation  Cognition  Arousal/Alertness: Lethargic(pt had oxycodone prior to treatment)  Behavior During Therapy: Flat affect  Overall Cognitive Status: Within Functional Limits for tasks assessed  General Comments: history of dementia and memory deficits. Following simple cues     Blood pressure 131/69, pulse (!) 55, temperature (!) 97.4 F (36.3 C), temperature source Oral, resp. rate 17, height 6\' 3"  (1.905 m), weight 94.6 kg (208 lb 8.9 oz), SpO2 (!) 87 %.  Physical Exam   Vitals reviewed.  HENT:   Head: Normocephalic.   Eyes: EOM are normal.   Neck: Normal range of motion. Neck supple. No thyromegaly present.   Cardiovascular:  Cardiac rate controlled   GI: Soft. Bowel sounds are normal. He exhibits no distension.  Neurological: He is alert.  Provides name, age.  Needed subtle cues for date of birth. Told me he lives in [place]. Language intact, decreased insight and awareness.  Limited medical historian. UE 4/5 prox to distal. LE: 3- to 3/5  HF, 3/5 KE and 4/5 ADF/PF. Senses pain in all 4's.   Skin: Skin is warm and dry.  Psychiatric:  Pleasant, cooperative         Lab Results Last 24 Hours  Imaging Results (Last 48 hours)                                                     Assessment/Plan:  Diagnosis: encephalopathy due to urosepsis/multiple medical, hx of prior right CVA  1.Does the need for close, 24 hr/day medical supervision in concert with the patient's rehab needs make it unreasonable for this patient to be served in a less intensive setting? Yes   2.Co-Morbidities requiring supervision/potential complications: radiation cystitis with chronic incontinence,  Afib,    3.Due to bladder management, bowel management, safety, skin/wound care, disease management, medication administration, pain management and patient education, does the patient require 24 hr/day rehab nursing? Yes   4.Does the patient require coordinated care of a physician, rehab nurse, PT (1-2 hrs/day, 5 days/week), OT (1-2 hrs/day, 5 days/week) and SLP (1-2 hrs/day, 5 days/week) to address physical and functional deficits in the context of the above medical diagnosis(es)? Yes Addressing deficits in the following areas: balance, endurance, locomotion, strength, transferring, bowel/bladder control, bathing, dressing, feeding, grooming, toileting, cognition and psychosocial support   5.Can the patient actively participate in an intensive therapy program of at least 3 hrs of therapy per day at least 5 days per week? Yes   6.The potential for patient to make measurable gains while on inpatient rehab is excellent   7.Anticipated functional outcomes upon discharge from inpatient rehab are modified independent and  supervision  with PT, modified independent and supervision with OT, supervision with SLP.   8.Estimated rehab length of stay to reach the above functional goals is: 13-18 days   9.Anticipated D/C setting: Home   10.Anticipated post D/C treatments: [place] therapy   11.Overall Rehab/Functional Prognosis: excellent      RECOMMENDATIONS:  This patient's condition is appropriate for continued rehabilitative care in the following setting: CIR  Patient has agreed to participate in recommended program. Yes  Note that insurance prior authorization may be required for reimbursement for recommended care.     Comment: Pt was active/mobile prior to his recent medical decline. Wife is very supportive. Rehab Admissions Coordinator to follow up.     Thanks,     Christopher Staggers, MD, Mellody Drown     I have personally performed a face to face diagnostic evaluation of this patient. Additionally, I have reviewed and concur with the physician assistant's documentation above.     Lavon Paganini Angiulli, PA-C  12/29/2017                Revision History                                        Routing History

## 2017-12-31 ENCOUNTER — Inpatient Hospital Stay (HOSPITAL_COMMUNITY): Payer: Self-pay | Admitting: Speech Pathology

## 2017-12-31 ENCOUNTER — Inpatient Hospital Stay (HOSPITAL_COMMUNITY): Payer: Self-pay

## 2017-12-31 ENCOUNTER — Inpatient Hospital Stay (HOSPITAL_COMMUNITY): Payer: Self-pay | Admitting: Physical Therapy

## 2017-12-31 NOTE — Progress Notes (Signed)
[place] PHYSICAL MEDICINE & REHABILITATION     PROGRESS NOTE    Subjective/Complaints: Feeling better this am. Slept last night with condom cath in place  ROS: Patient denies fever, rash, sore throat, blurred vision, nausea, vomiting, diarrhea, cough, shortness of breath or chest pain, joint or back pain, headache, or mood change.   Objective:  No results found. Recent Labs    12/30/17 0424  WBC 12.8*  HGB 10.1*  HCT 33.8*  PLT 274   Recent Labs    12/29/17 0748 12/30/17 0424  NA 143 144  K 3.5 3.6  CL 106 105  GLUCOSE 121* 116*  BUN 40* 35*  CREATININE 1.62* 1.57*  CALCIUM 9.2 9.1   CBG (last 3)  Recent Labs    12/29/17 2051 12/30/17 0803 12/30/17 1239  GLUCAP 137* 106* 116*    Wt Readings from Last 3 Encounters:  12/31/17 92.9 kg (204 lb 12.9 oz)  12/30/17 94.5 kg (208 lb 5.4 oz)  12/09/17 98.9 kg (218 lb)     Intake/Output Summary (Last 24 hours) at 12/31/2017 0909 Last data filed at 12/31/2017 0431 Gross per 24 hour  Intake -  Output 975 ml  Net -975 ml    Vital Signs: Blood pressure (!) 125/59, pulse 70, temperature 98 F (36.7 C), resp. rate 18, height 6\' 3"  (1.905 m), weight 92.9 kg (204 lb 12.9 oz), SpO2 96 %. Physical Exam:  Constitutional: No distress . Vital signs reviewed. HEENT: EOMI, oral membranes moist Neck: supple Cardiovascular: RRR w/ murmur. No JVD    Respiratory: CTA Bilaterally without wheezes or rales. Normal effort    GI: BS +, non-tender, non-distended  Neurological: A cranial nerve deficit is present.  Patient provides his name and age. More alert, still a bit confused. Follows simple commands. UE 2-3/5 prox to distal. LE: 2 /5 prox to 3/5 distally. Senses pai nin all 4's.  Psychiatric:  Pleasant and cooperative     Assessment/Plan: 1. Functional deficits secondary to urosepsis and subsequent encephalopathy and debility which require 3+ hours per day of interdisciplinary therapy in a comprehensive inpatient rehab  setting. Physiatrist is providing close team supervision and 24 hour management of active medical problems listed below. Physiatrist and rehab team continue to assess barriers to discharge/monitor patient progress toward functional and medical goals.  Function:  Bathing Bathing position      Bathing parts      Bathing assist        Upper Body Dressing/Undressing Upper body dressing                    Upper body assist        Lower Body Dressing/Undressing Lower body dressing                                  Lower body assist        Toileting Toileting          Toileting assist     Transfers Chair/bed transfer             Locomotion Ambulation           Wheelchair          Cognition Comprehension    Expression Expression assist level: Expresses basic 75 - 89% of the time/requires cueing 10 - 24% of the time. Needs helper to occlude trach/needs to repeat words.  Social Interaction Social Interaction assist level:  Interacts appropriately 75 - 89% of the time - Needs redirection for appropriate language or to initiate interaction.  Problem Solving    Memory     Medical Problem List and Plan:  1. Decreased functional mobility secondary to encephalopathy due to urosepsis/multi-medical  -beginning therapies  -still not back to cognitive baseline 2. DVT Prophylaxis/Anticoagulation: Eliquis. Monitor for any bleeding episodes  3. Pain Management: Lidoderm patch  -avoid oxycodone due to oversedation  -schedule tylenol 650mg  qid  -heat/ice prn  -consider low dose tramadol for severe pain if needed 4. Mood: Aricept 10 mg nightly, Cymbalta 30 mg nightly  5. Neuropsych: This patient is capable of making decisions on his own behalf.  6. Skin/Wound Care: Stage I sacral pressure injury. Routine skin checks  7. Fluids/Electrolytes/Nutrition: Routine in and outs with follow-up chemistries during admission  -encourage PO 8. ID. IV Rocephin  completed 12/29/2017.  9. V. tach cardiac arrest with history of PAF/pacemaker. Patient currently off Tikosyn until follow-up outpatient with cardiology services.  10. Chronic systolic congestive heart failure. Monitor for any signs of fluid overload. Lasix 40 mg daily  -will change this to nights given bladder issues  -daily weights     Filed Weights   12/30/17 1552 12/31/17 0500  Weight: 91.7 kg (202 lb 2.6 oz) 92.9 kg (204 lb 12.9 oz)     11. Psoriatic arthritis. Methotrexate and chronic prednisone currently on hold.    -will resume during this admit 12. Hypertension. Toprol-XL 25 mg daily. Monitor with increased mobility  13. Hyperlipidemia. Lipitor  14. CKD stage III. Follow-up chemistries durin admit 15. History of prostate cancer status post radiation. Patient on Flomax prior to admission 0.4 mg daily. Renal ultrasound no hydronephrosis. Follow-up outpatient Dr. Amalia Hailey  -will use condom catheter at nights for continence and urinary frequency which has affected sleep previously  16. Acute on chronic anemia. Continue iron supplement. Follow-up CBC    LOS (Days) 1 A FACE TO FACE EVALUATION WAS PERFORMED  [name], MD 12/31/2017 9:09 AM

## 2017-12-31 NOTE — Evaluation (Signed)
Speech Language Pathology Assessment and Plan  Patient Details  Name: Christopher Baker MRN: [id number] Date of Birth: 29-Nov-1933  SLP Diagnosis: Cognitive Impairments  Rehab Potential: Fair ELOS: 21 days     Today's Date: 12/31/2017 SLP Individual Time: 6979-4801 SLP Individual Time Calculation (min): 60 min   Problem List:  Patient Active Problem List   Diagnosis Date Noted  . Encephalopathy 12/30/2017  . Atrial fibrillation with rapid ventricular response ([place])   . Acute respiratory failure with hypoxemia ([place])   . Aspiration pneumonia of right upper lobe due to gastric secretions ([place])   . Drug-induced torsades de pointes 12/24/2017  . Sepsis ([place]) 12/23/2017  . Ventricular tachycardia ([place]) 12/23/2017  . Pressure injury of skin 12/23/2017  . E coli bacteremia 12/23/2017  . Urinary tract infection 12/23/2017  . Acute pulmonary edema ([place]) 12/23/2017  . Sepsis, unspecified organism ([place]) 10/30/2017  . Acute urinary retention 10/30/2017  . Chronic systolic congestive heart failure ([place]) 09/07/2017  . S/P shoulder replacement 06/27/2015  . Residual cognitive deficit as late effect of stroke 04/15/2015  . Lung nodule   . Weakness generalized 12/11/2014  . Leukocytosis 12/11/2014  . Restless leg syndrome 12/11/2014  . Normocytic anemia 12/11/2014  . Abnormal EKG 12/11/2014  . Gait disturbance, post-stroke 10/22/2014  . Partial seizures ([place]) 06/18/2014  . Vitamin B12 deficiency 06/18/2014  . OSA (obstructive sleep apnea) 02/26/2014  . Cognitive deficits as late effect of cerebrovascular disease 01/11/2014  . Fatigue 09/20/2013  . Intermittent lightheadedness 06/19/2013  . Low blood pressure, not hypotension 06/19/2013  . Vascular dementia 04/09/2013  . History of completed stroke 04/03/2013  . Occlusion and stenosis of carotid artery without mention of cerebral infarction 11/21/2012  . OA (osteoarthritis) of knee 12/20/2011  . Methotrexate, long term, current use  09/10/2011  . Knee pain, left 07/27/2011  . Pacemaker-Medtronic 09/08/2010  . UNSPECIFIED VISUAL DISTURBANCE 07/20/2010  . IRON DEFIC ANEMIA [place] DIET IRON INTAKE 04/20/2010  . PSORIASIS 04/20/2010  . DISUSE OSTEOPOROSIS 05/12/2009  . SPONDYLOSIS, LUMBAR, WITH RADICULOPATHY 10/15/2008  . OTHER SPECIFIED DERMATOMYCOSES 06/24/2008  . VARICOSE VEINS LOWER EXTREMITIES W/INFLAMMATION 12/25/2007  . INSOMNIA WITH SLEEP APNEA UNSPECIFIED 08/31/2007  . GERD 03/23/2007  . Atrial fibrillation, chronic ([place]) 01/31/2007  . PSORIATIC ARTHRITIS 01/31/2007  . Hyperlipidemia 01/19/2007  . Essential hypertension 01/19/2007  . History of prostate cancer 01/19/2007   Past Medical History:  Past Medical History:  Diagnosis Date  . Anxiety   . Arthritis    "hands, right hip; lower back" (02/28/2017)  . Arthritis with psoriasis ([place])   . Borderline diabetes    DIET CONTROLLED - PT STATES HE IS NOT DIABETIC  . Carotid stenosis, bilateral PER DR EARLY / DUPLEX  09-09-10     40 - 59%   BILATERALLY--- ASYMPTOMATIC  . Chronic lower back pain   . Depression   . Dysrhythmia    a-fib  . GERD (gastroesophageal reflux disease)    CONTROLLED W/ PROTONIX  . History of gout   . HTN (hypertension)   . Hyperlipidemia   . Iron deficiency   . Lumbar spondylosis W/ RADICULOPATHY  . OSA on CPAP   . PAF (paroxysmal atrial fibrillation) ([place])   . Presence of permanent cardiac pacemaker DDD-  06-04-10-   DDD; SECONDARY TO SYNCOPY AND BRADYCARDIA  . Prostate cancer ([place]) 2008   S/P 67 RADIATION  [place]    . Restless leg syndrome   . Stroke [facility]) Oct. 14, 2014   light left hand,  balance issues, short term memory loss 2014  . SVT (supraventricular tachycardia) ([place])    S/P ABLATION   2008   Past Surgical History:  Past Surgical History:  Procedure Laterality Date  . BACK SURGERY    . BIV UPGRADE N/A 09/07/2017   Procedure: BIV PACEMAKER UPGRADE;  Surgeon: Evans Lance, MD;  Location: [facility];   Service: Cardiovascular;  Laterality: N/A;  . BREAST SURGERY     "removed tumor; don't remember which side; years ago" (02/28/2017)  . CARDIAC ELECTROPHYSIOLOGY STUDY AND ABLATION  2008   FOR SVT  . CARDIAC PACEMAKER PLACEMENT  06-04-10   DDD  . CARDIOVERSION N/A 03/02/2017   Procedure: CARDIOVERSION;  Surgeon: Josue Hector, MD;  Location: [facility] ENDOSCOPY;  Service: Cardiovascular;  Laterality: N/A;  . CATARACT EXTRACTION W/ INTRAOCULAR LENS  IMPLANT, BILATERAL Bilateral   . CHOLECYSTECTOMY  2009  . ESOPHAGOGASTRODUODENOSCOPY N/A 12/12/2014   Procedure: ESOPHAGOGASTRODUODENOSCOPY (EGD);  Surgeon: Wilford Corner, MD;  Location: [facility] ENDOSCOPY;  Service: Endoscopy;  Laterality: N/A;  . INCISION AND DRAINAGE Right 1997   "knee replacement got infected"  . INGUINAL HERNIA REPAIR Left 06/2003    DONE WITH PENILE PROSTESIS SURG.  . INGUINAL HERNIA REPAIR Right 1962  . JOINT REPLACEMENT    . KNEE ARTHROSCOPY  06/02/2011   Procedure: ARTHROSCOPY KNEE;  Surgeon: Gearlean Alf;  Location: [place];  Service: Orthopedics;  Laterality: Left;  LEFT KNEE ARTHROSCOPY WITH DEBRIDEMENT  . LOOP RECORDER PLACEMENT  01-30-10  . LOOP RECORDER REMOVAL  06/04/2010  . LUMBAR LAMINECTOMY/ DISKECTOMY/ FUSION  05-19-10   L4 - 5  . PENILE PROSTHESIS IMPLANT  06/2003   AND LEFT CORPOROPLASTY /    DONE INGUINAL REPAIR  . PROSTATE BIOPSY  2007  . REVISION TOTAL KNEE ARTHROPLASTY Right 1997  . TOTAL KNEE ARTHROPLASTY  12/20/2011   Procedure: TOTAL KNEE ARTHROPLASTY;  Surgeon: Gearlean Alf, MD;  Location: [facility];  Service: Orthopedics;  Laterality: Left;  . TOTAL KNEE ARTHROPLASTY Right 1997  . TOTAL SHOULDER ARTHROPLASTY Right 06/27/2015   Procedure: REVERSE TOTAL SHOULDER ARTHROPLASTY;  Surgeon: Netta Cedars, MD;  Location: [place];  Service: Orthopedics;  Laterality: Right;  . TRANSURETHRAL RESECTION OF BLADDER TUMOR WITH GYRUS (TURBT-GYRUS)  ?2018  . TRANSURETHRAL RESECTION OF PROSTATE  2006     Assessment / Plan / Recommendation Clinical Impression   Christopher Baker. Christopher Baker is a 84 year old right-handed male with history of prostate cancer status post radiation, psoriatic arthritis maintained on methotrexate and chronic prednisone, PAF/pacemaker maintained on Eliquis as well as Tikosyn, prior CVA, chronic systolic congestive heart failure with ejection fraction 25%, mild dementia maintained on Aricept, hypertension as well as borderline diabetes mellitus. Per chart review patient lives with his wife. Used a rolling walker prior to admission. Reports patient had been essentially bedbound for the past week but was able to stand and complete a stand pivot transfer. Presented 12/23/2017 with altered mental status. In the ED noted fever as well as heart rate in the 150s. Placed on broad-spectrum antibiotics for suspected urosepsis. Chest x-ray showed some vascular congestion probable mild pulmonary edema, troponin 1.55, urine culture greater than 100,000 E. coli, lactic acid 2.6, creatinine 1.37. Patient developed V. tach arrest later that evening followed by cardiology services. Patient did receive CPR. Maintained on lidocaine drip from 7/6 to 12/27/2017. Echocardiogram ejection fraction 30% diffuse hypokinesis. Renal ultrasound no hydronephrosis. No further work-up indicated per cardiology services. CT of the abdomen with no acute  findings. Patient completed 7-day course of Rocephin 12/29/2017 for treatment of bacteremia/urosepsis per ID recommendation. Follow-up blood cultures no growth. Noted stage I pressure injury presented on admission with routine skin care. Physical and occupational therapy evaluations completed with recommendations of physical medicine rehab consult. This morning patient received oxycodone for low back pain and developed dizziness/nausea/difficulty staying awake. Wife states she has used 1/2 to 1/4 tab at home before for severe pain only. Patient was admitted for a comprehensive rehab  program.  SLP evaluation completed on 12/31/2017 with the following results:  Pt presents with moderate cognitive deficits in the setting of encephalopathy.  Unclear what pt's baseline is as family was not present to verify pt's report.  Pt indicates that he was fully independent and driving prior to admission.  Pt currently presents with disorientation to date and situation, decreased sustained attention, decreased recall of new information, decreased task initiation, and decreased functional problem solving and needs mod assist to complete basic ADLs such as self feeding.  Pain and lethargy also appear to be contributing factors as well.  As a result, recommend intermittent supervision during meals.  Given the abovementioned deficits, pt would benefit from skilled ST while inpatient in order to maximize functional independence and reduce burden of care prior to discharge.  Anticipate that pt will need ST follow up at next level of care and 24/7 supervision at discharge.      Skilled Therapeutic Interventions          Cognitive-linguistic evaluation completed with results and recommendations reviewed with patient.    SLP Assessment  Patient will need skilled [facility] during CIR admission    Recommendations  Recommendations for Other Services: Neuropsych consult Patient destination: Home Follow up Recommendations: Home Health SLP;24 hour supervision/assistance;Outpatient SLP Equipment Recommended: None recommended by SLP    SLP Frequency 3 to 5 out of 7 days   SLP Duration  SLP Intensity  SLP Treatment/Interventions 21 days   Minumum of 1-2 x/day, 30 to 90 minutes  Cognitive remediation/compensation;Cueing hierarchy;Functional tasks;Patient/family education;Internal/external aids;Environmental controls    Pain  7/10 back pain, RN made aware  Prior Functioning Type of Home: House  Lives With: Spouse Available Help at Discharge: Family;Available 24  hours/day Vocation: Retired  Function:  Eating Eating   Modified Consistency Diet: No Eating Assist Level: Supervision or verbal cues           Cognition Comprehension Comprehension assist level: Follows basic conversation/direction with extra time/assistive device  Expression   Expression assist level: Expresses basic needs/ideas: With extra time/assistive device  Social Interaction Social Interaction assist level: Interacts appropriately 50 - 74% of the time - May be physically or verbally inappropriate.  Problem Solving Problem solving assist level: Solves basic 50 - 74% of the time/requires cueing 25 - 49% of the time  Memory Memory assist level: Recognizes or recalls 50 - 74% of the time/requires cueing 25 - 49% of the time   Short Term Goals: Week 1: SLP Short Term Goal 1 (Week 1): Pt will use external aids to facilitate recall of daily information with min assist.   SLP Short Term Goal 2 (Week 1): Pt will use environmental cues to facilitate orientation to date and situation with min assist.  SLP Short Term Goal 3 (Week 1): Pt will complete mildly complex tasks with min assist for functional problem solving.   SLP Short Term Goal 4 (Week 1): Pt will sustain his attention to functional tasks for 15 min intervals with  min cues for redirection.    Refer to Care Plan for Long Term Goals  Recommendations for other services: Neuropsych  Discharge Criteria: Patient will be discharged from SLP if patient refuses treatment 3 consecutive times without medical reason, if treatment goals not met, if there is a change in medical status, if patient makes no progress towards goals or if patient is discharged from hospital.  The above assessment, treatment plan, treatment alternatives and goals were discussed and mutually agreed upon: by patient  Emilio Math 12/31/2017, 3:35 PM

## 2017-12-31 NOTE — Plan of Care (Signed)
  Problem: RH BLADDER ELIMINATION Goal: RH STG MANAGE BLADDER WITH ASSISTANCE Description STG Manage Bladder With mod Assistance  Outcome: Not Progressing; chronic foley

## 2017-12-31 NOTE — Progress Notes (Signed)
Pt found in room with CPAP mask removed; pt refusing to wear mask; will continue to monitor

## 2017-12-31 NOTE — Progress Notes (Signed)
Physical Therapy Assessment and Plan  Patient Details  Name: Christopher Baker MRN: [id number] Date of Birth: 1934-05-20  PT Diagnosis: Abnormality of gait, Difficulty walking, Edema, Impaired cognition, Impaired sensation and Muscle weakness Rehab Potential: Good ELOS: 18-21 days   Today's Date: 12/31/2017 PT Individual Time: 1300-1400 PT Individual Time Calculation (min): 60 min    Problem List:  Patient Active Problem List   Diagnosis Date Noted  . Encephalopathy 12/30/2017  . Atrial fibrillation with rapid ventricular response ([place])   . Acute respiratory failure with hypoxemia ([place])   . Aspiration pneumonia of right upper lobe due to gastric secretions ([place])   . Drug-induced torsades de pointes 12/24/2017  . Sepsis ([place]) 12/23/2017  . Ventricular tachycardia ([place]) 12/23/2017  . Pressure injury of skin 12/23/2017  . E coli bacteremia 12/23/2017  . Urinary tract infection 12/23/2017  . Acute pulmonary edema ([place]) 12/23/2017  . Sepsis, unspecified organism ([place]) 10/30/2017  . Acute urinary retention 10/30/2017  . Chronic systolic congestive heart failure ([place]) 09/07/2017  . S/P shoulder replacement 06/27/2015  . Residual cognitive deficit as late effect of stroke 04/15/2015  . Lung nodule   . Weakness generalized 12/11/2014  . Leukocytosis 12/11/2014  . Restless leg syndrome 12/11/2014  . Normocytic anemia 12/11/2014  . Abnormal EKG 12/11/2014  . Gait disturbance, post-stroke 10/22/2014  . Partial seizures ([place]) 06/18/2014  . Vitamin B12 deficiency 06/18/2014  . OSA (obstructive sleep apnea) 02/26/2014  . Cognitive deficits as late effect of cerebrovascular disease 01/11/2014  . Fatigue 09/20/2013  . Intermittent lightheadedness 06/19/2013  . Low blood pressure, not hypotension 06/19/2013  . Vascular dementia 04/09/2013  . History of completed stroke 04/03/2013  . Occlusion and stenosis of carotid artery without mention of cerebral infarction 11/21/2012  . OA  (osteoarthritis) of knee 12/20/2011  . Methotrexate, long term, current use 09/10/2011  . Knee pain, left 07/27/2011  . Pacemaker-Medtronic 09/08/2010  . UNSPECIFIED VISUAL DISTURBANCE 07/20/2010  . IRON DEFIC ANEMIA [place] DIET IRON INTAKE 04/20/2010  . PSORIASIS 04/20/2010  . DISUSE OSTEOPOROSIS 05/12/2009  . SPONDYLOSIS, LUMBAR, WITH RADICULOPATHY 10/15/2008  . OTHER SPECIFIED DERMATOMYCOSES 06/24/2008  . VARICOSE VEINS LOWER EXTREMITIES W/INFLAMMATION 12/25/2007  . INSOMNIA WITH SLEEP APNEA UNSPECIFIED 08/31/2007  . GERD 03/23/2007  . Atrial fibrillation, chronic ([place]) 01/31/2007  . PSORIATIC ARTHRITIS 01/31/2007  . Hyperlipidemia 01/19/2007  . Essential hypertension 01/19/2007  . History of prostate cancer 01/19/2007    Past Medical History:  Past Medical History:  Diagnosis Date  . Anxiety   . Arthritis    "hands, right hip; lower back" (02/28/2017)  . Arthritis with psoriasis ([place])   . Borderline diabetes    DIET CONTROLLED - PT STATES HE IS NOT DIABETIC  . Carotid stenosis, bilateral PER DR EARLY / DUPLEX  09-09-10     40 - 59%   BILATERALLY--- ASYMPTOMATIC  . Chronic lower back pain   . Depression   . Dysrhythmia    a-fib  . GERD (gastroesophageal reflux disease)    CONTROLLED W/ PROTONIX  . History of gout   . HTN (hypertension)   . Hyperlipidemia   . Iron deficiency   . Lumbar spondylosis W/ RADICULOPATHY  . OSA on CPAP   . PAF (paroxysmal atrial fibrillation) ([place])   . Presence of permanent cardiac pacemaker DDD-  06-04-10-   DDD; SECONDARY TO SYNCOPY AND BRADYCARDIA  . Prostate cancer ([place]) 2008   S/P 79 RADIATION  [place]    . Restless leg syndrome   . Stroke (  [place]) Oct. 14, 2014   light left hand, balance issues, short term memory loss 2014  . SVT (supraventricular tachycardia) ([place])    S/P ABLATION   2008   Past Surgical History:  Past Surgical History:  Procedure Laterality Date  . BACK SURGERY    . BIV UPGRADE N/A 09/07/2017   Procedure: BIV PACEMAKER  UPGRADE;  Surgeon: Evans Lance, MD;  Location: [facility];  Service: Cardiovascular;  Laterality: N/A;  . BREAST SURGERY     "removed tumor; don't remember which side; years ago" (02/28/2017)  . CARDIAC ELECTROPHYSIOLOGY STUDY AND ABLATION  2008   FOR SVT  . CARDIAC PACEMAKER PLACEMENT  06-04-10   DDD  . CARDIOVERSION N/A 03/02/2017   Procedure: CARDIOVERSION;  Surgeon: Josue Hector, MD;  Location[facility]OSCOPY;  Service: Cardiovascular;  Laterality: N/A;  . CATARACT EXTRACTION W/ INTRAOCULAR LENS  IMPLANT, BILATERAL Bilateral   . CHOLECYSTECTOMY  2009  . ESOPHAGOGASTRODUODENOSCOPY N/A 12/12/2014   Procedure: ESOPHAGOGASTRODUODENOSCOPY (EGD);  Surgeon: Wilford Corner, MD;  Location: [facility]COPY;  Service: Endoscopy;  Laterality: N/A;  . INCISION AND DRAINAGE Right 1997   "knee replacement got infected"  . INGUINAL HERNIA REPAIR Left 06/2003    DONE WITH PENILE PROSTESIS SURG.  . INGUINAL HERNIA REPAIR Right 1962  . JOINT REPLACEMENT    . KNEE ARTHROSCOPY  06/02/2011   Procedure: ARTHROSCOPY KNEE;  Surgeon: Gearlean Alf;  Location: [facility];  Service: Orthopedics;  Laterality: Left;  LEFT KNEE ARTHROSCOPY WITH DEBRIDEMENT  . LOOP RECORDER PLACEMENT  01-30-10  . LOOP RECORDER REMOVAL  06/04/2010  . LUMBAR LAMINECTOMY/ DISKECTOMY/ FUSION  05-19-10   L4 - 5  . PENILE PROSTHESIS IMPLANT  06/2003   AND LEFT CORPOROPLASTY /    DONE INGUINAL REPAIR  . PROSTATE BIOPSY  2007  . REVISION TOTAL KNEE ARTHROPLASTY Right 1997  . TOTAL KNEE ARTHROPLASTY  12/20/2011   Procedure: TOTAL KNEE ARTHROPLASTY;  Surgeon: Gearlean Alf, MD;  Location: [facility];  Service: Orthopedics;  Laterality: Left;  . TOTAL KNEE ARTHROPLASTY Right 1997  . TOTAL SHOULDER ARTHROPLASTY Right 06/27/2015   Procedure: REVERSE TOTAL SHOULDER ARTHROPLASTY;  Surgeon: Netta Cedars, MD;  Location: [facility];  Service: Orthopedics;  Laterality: Right;  . TRANSURETHRAL RESECTION OF BLADDER TUMOR WITH GYRUS  (TURBT-GYRUS)  ?2018  . TRANSURETHRAL RESECTION OF PROSTATE  2006    Assessment & Plan Clinical Impression: Christopher Baker. Christopher Baker is a 84 year old right-handed male with history of prostate cancer status post radiation, psoriatic arthritis maintained on methotrexate and chronic prednisone, PAF/pacemaker maintained on Eliquis as well as Tikosyn, prior CVA, chronic systolic congestive heart failure with ejection fraction 25%, mild dementia maintained on Aricept, hypertension as well as borderline diabetes mellitus. Per chart review patient lives with his wife. Used a rolling walker prior to admission. Reports patient had been essentially bedbound for the past week but was able to stand and complete a stand pivot transfer. Presented 12/23/2017 with altered mental status. In the ED noted fever as well as heart rate in the 150s. Placed on broad-spectrum antibiotics for suspected urosepsis. Chest x-ray showed some vascular congestion probable mild pulmonary edema, troponin 1.55, urine culture greater than 100,000 E. coli, lactic acid 2.6, creatinine 1.37. Patient developed V. tach arrest later that evening followed by cardiology services. Patient did receive CPR. Maintained on lidocaine drip from 7/6 to 12/27/2017. Echocardiogram ejection fraction 30% diffuse hypokinesis. Renal ultrasound no hydronephrosis. No further work-up indicated per cardiology services. CT of  the abdomen with no acute findings. Patient completed 7-day course of Rocephin 12/29/2017 for treatment of bacteremia/urosepsis per ID recommendation. Follow-up blood cultures no growth. Noted stage I pressure injury presented on admission with routine skin care. Physical and occupational therapy evaluations completed with recommendations of physical medicine rehab consult. This morning patient received oxycodone for low back pain and developed dizziness/nausea/difficulty staying awake. Wife states she has used 1/2 to 1/4 tab at home before for severe pain only.  Patient was admitted for a comprehensive rehab program.   Patient transferred to CIR on 12/30/2017 .   Patient currently requires total with mobility secondary to muscle weakness, decreased cardiorespiratoy endurance, decreased initiation, decreased safety awareness, decreased memory and delayed processing and decreased sitting balance, decreased standing balance, decreased postural control and decreased balance strategies.  Prior to hospitalization, patient was modified independent  with mobility and lived with Spouse in a House home.  Home access is 3Stairs to enter.  Patient will benefit from skilled PT intervention to maximize safe functional mobility, minimize fall risk and decrease caregiver burden for planned discharge home with 24 hour supervision.  Anticipate patient will benefit from follow up [place] at discharge.  PT - End of Session Activity Tolerance: Tolerates 10 - 20 min activity with multiple rests Endurance Deficit: Yes Endurance Deficit Description: pt requires prolonged seated rest break following any mobility PT Assessment Rehab Potential (ACUTE/IP ONLY): Good PT Barriers to Discharge: Medical stability;Home environment access/layout PT Patient demonstrates impairments in the following area(s): Balance;Endurance;Pain;Safety;Sensory;Edema PT Transfers Functional Problem(s): Bed Mobility;Bed to Chair;Car;Furniture;Floor PT Locomotion Functional Problem(s): Ambulation;Wheelchair Mobility;Stairs PT Plan PT Intensity: Minimum of 1-2 x/day ,45 to 90 minutes PT Frequency: 5 out of 7 days PT Duration Estimated Length of Stay: 18-21 days PT Treatment/Interventions: Ambulation/gait training;Balance/vestibular training;Cognitive remediation/compensation;Community reintegration;Discharge planning;Disease management/prevention;DME/adaptive equipment instruction;Functional mobility training;Pain management;Patient/family education;Psychosocial support;Stair training;Therapeutic  Activities;Therapeutic Exercise;UE/LE Strength taining/ROM;UE/LE Coordination activities;Wheelchair propulsion/positioning PT Transfers Anticipated Outcome(s): Supervision PT Locomotion Anticipated Outcome(s): TBD (w/c vs RW) PT Recommendation Follow Up Recommendations: Home health PT Patient destination: Home Equipment Recommended: To be determined  Skilled Therapeutic Intervention Evaluation completed (see details above and below) with education on PT POC and goals and individual treatment initiated with focus on bed mobility, functional transfers, and initiating gait assessment. Pt received supine in bed with eyes closed, wife present. Pt reports some pain in R hip region and low back, not rated. Just received pain medication from RN per pt's wife. Pt easily arousable and agreeable to therapy evaluation. Supine to sit with mod A for BLE management. Mod A to stand to stedy, stedy transfer bed to w/c. Pt significantly fatigued by mobility and requires prolonged rest breaks between activities to recover, vitals WNL. Manual w/c propulsion x 15 ft with min A for steering with BUE. Attempt squat pivot transfer w/c to mat table, pt unable to follow cues to sequence to complete transfer. Sit to stand with max A in // bars. Pt is able to take a few steps fwd/back with max A for balance. Stedy transfer back to bed. Sit to supine mod A for BLE management. Pt left supine in bed with needs in reach, bed alarm in place.  PT Evaluation Precautions/Restrictions Precautions Precautions: Fall Precaution Comments: chest and ribs sore from CPR Restrictions Weight Bearing Restrictions: No Other Position/Activity Restrictions: self R UE limiting due to R flank pain and solid bump/mass at bottom of rib cage Home Living/Prior Functioning Home Living Available Help at Discharge: Family;Available 24 hours/day Type of Home: House Home Access:  Stairs to enter [employer] of Steps: 3 Entrance Stairs-Rails:  Right;Left Home Layout: Able to live on main level with bedroom/bathroom  Lives With: Spouse Prior Function Level of Independence: Independent with gait;Independent with transfers;Requires assistive device for independence  Able to Take Stairs?: Yes Driving: No Vocation: Retired Radiographer, therapeutic - History Baseline Vision: Wears glasses only for reading Perception Perception: Within Functional Limits Praxis Praxis: Intact  Cognition Overall Cognitive Status: History of cognitive impairments - at baseline Arousal/Alertness: Awake/alert Orientation Level: Oriented X4 Attention: Sustained Sustained Attention: Impaired Memory: Impaired Memory Impairment: Decreased recall of new information;Retrieval deficit;Storage deficit Awareness: Appears intact Problem Solving: Impaired Safety/Judgment: Impaired Sensation Sensation Light Touch: Impaired by gross assessment(onset of N/T with light touch) Proprioception: Impaired by gross assessment Coordination Gross Motor Movements are Fluid and Coordinated: No Fine Motor Movements are Fluid and Coordinated: No Finger Nose Finger Test: impaired Heel Shin Test: impaired Motor  Motor Motor: Within Functional Limits  Mobility Bed Mobility Bed Mobility: Supine to Sit;Sit to Supine Supine to Sit: Moderate Assistance - Patient 50-74% Sit to Supine: Moderate Assistance - Patient 50-74% Transfers Transfers: Transfer via Geophysicist/field seismologist;Sit to Stand Sit to Stand: Maximal Assistance - Patient 25-49% Transfer (Assistive device): Other (Comment) Transfer via Lift Equipment: Stedy(mod A to stand from elevated bed to stedy) Civil Service fast streamer (Feet): 2 Feet Assistive device: Parallel bars Gait Gait Pattern: Impaired Gait velocity: decreased Stairs / Additional Locomotion Stairs: No Wheelchair Mobility Wheelchair Mobility: Yes Wheelchair Assistance: Minimal assistance - Patient >75% Wheelchair Propulsion: Both upper  extremities Wheelchair Parts Management: Needs assistance Distance: 15 ft  Trunk/Postural Assessment  Cervical Assessment Cervical Assessment: Exceptions to WFL(forward head) Thoracic Assessment Thoracic Assessment: Exceptions to WFL(rounded shoulders) Lumbar Assessment Lumbar Assessment: Exceptions to WFL(posterior pelvic tilt) Postural Control Postural Control: Deficits on evaluation(delayed)  Balance Balance Balance Assessed: Yes Static Sitting Balance Static Sitting - Balance Support: No upper extremity supported;Feet supported Static Sitting - Level of Assistance: 5: Stand by assistance Dynamic Sitting Balance Dynamic Sitting - Balance Support: Feet supported;No upper extremity supported Dynamic Sitting - Level of Assistance: 4: Min Insurance risk surveyor Standing - Balance Support: Bilateral upper extremity supported;During functional activity Static Standing - Level of Assistance: 4: Min assist Static Standing - Comment/# of Minutes: standing in stedy Extremity Assessment  RUE Assessment RUE Assessment: Exceptions to [facility] Active Range of Motion (AROM) Comments: 3-/5 grossly LUE Assessment LUE Assessment: Exceptions to [facility] Active Range of Motion (AROM) Comments: 3-/5 grossly RLE Assessment RLE Assessment: Exceptions to [facility] Passive Range of Motion (PROM) Comments: hip PROM lim by pain RLE Strength Right Hip Flexion: 3-/5 Right Knee Flexion: 4/5 Right Knee Extension: 4/5 Right Ankle Dorsiflexion: 3/5 LLE Assessment LLE Assessment: Exceptions to [facility] Passive Range of Motion (PROM) Comments: WFL LLE Strength Left Hip Flexion: 4/5 Left Knee Flexion: 4/5 Left Knee Extension: 4/5 Left Ankle Dorsiflexion: 4/5   See Function Navigator for Current Functional Status.   Refer to Care Plan for Long Term Goals  Recommendations for other services: None   Discharge Criteria: Patient will be discharged from PT if patient refuses treatment 3 consecutive times  without medical reason, if treatment goals not met, if there is a change in medical status, if patient makes no progress towards goals or if patient is discharged from hospital.  The above assessment, treatment plan, treatment alternatives and goals were discussed and mutually agreed upon: by patient and by family  Excell Seltzer, PT, DPT  12/31/2017, 3:59 PM

## 2017-12-31 NOTE — Evaluation (Signed)
Occupational Therapy Assessment and Plan  Patient Details  Name: Christopher Baker MRN: [id number] Date of Birth: December 02, 1933  OT Diagnosis: abnormal posture, acute pain, cognitive deficits, muscle weakness (generalized) and coordination disorder Rehab Potential:   ELOS: 18-21   Today's Date: 12/31/2017 OT Individual Time: 4098-1191 OT Individual Time Calculation (min): 75 min     Problem List:  Patient Active Problem List   Diagnosis Date Noted  . Encephalopathy 12/30/2017  . Atrial fibrillation with rapid ventricular response ([place])   . Acute respiratory failure with hypoxemia ([place])   . Aspiration pneumonia of right upper lobe due to gastric secretions ([place])   . Drug-induced torsades de pointes 12/24/2017  . Sepsis ([place]) 12/23/2017  . Ventricular tachycardia ([place]) 12/23/2017  . Pressure injury of skin 12/23/2017  . E coli bacteremia 12/23/2017  . Urinary tract infection 12/23/2017  . Acute pulmonary edema ([place]) 12/23/2017  . Sepsis, unspecified organism ([place]) 10/30/2017  . Acute urinary retention 10/30/2017  . Chronic systolic congestive heart failure ([place]) 09/07/2017  . S/P shoulder replacement 06/27/2015  . Residual cognitive deficit as late effect of stroke 04/15/2015  . Lung nodule   . Weakness generalized 12/11/2014  . Leukocytosis 12/11/2014  . Restless leg syndrome 12/11/2014  . Normocytic anemia 12/11/2014  . Abnormal EKG 12/11/2014  . Gait disturbance, post-stroke 10/22/2014  . Partial seizures ([place]) 06/18/2014  . Vitamin B12 deficiency 06/18/2014  . OSA (obstructive sleep apnea) 02/26/2014  . Cognitive deficits as late effect of cerebrovascular disease 01/11/2014  . Fatigue 09/20/2013  . Intermittent lightheadedness 06/19/2013  . Low blood pressure, not hypotension 06/19/2013  . Vascular dementia 04/09/2013  . History of completed stroke 04/03/2013  . Occlusion and stenosis of carotid artery without mention of cerebral infarction 11/21/2012  . OA  (osteoarthritis) of knee 12/20/2011  . Methotrexate, long term, current use 09/10/2011  . Knee pain, left 07/27/2011  . Pacemaker-Medtronic 09/08/2010  . UNSPECIFIED VISUAL DISTURBANCE 07/20/2010  . IRON DEFIC ANEMIA [place] DIET IRON INTAKE 04/20/2010  . PSORIASIS 04/20/2010  . DISUSE OSTEOPOROSIS 05/12/2009  . SPONDYLOSIS, LUMBAR, WITH RADICULOPATHY 10/15/2008  . OTHER SPECIFIED DERMATOMYCOSES 06/24/2008  . VARICOSE VEINS LOWER EXTREMITIES W/INFLAMMATION 12/25/2007  . INSOMNIA WITH SLEEP APNEA UNSPECIFIED 08/31/2007  . GERD 03/23/2007  . Atrial fibrillation, chronic ([place]) 01/31/2007  . PSORIATIC ARTHRITIS 01/31/2007  . Hyperlipidemia 01/19/2007  . Essential hypertension 01/19/2007  . History of prostate cancer 01/19/2007    Past Medical History:  Past Medical History:  Diagnosis Date  . Anxiety   . Arthritis    "hands, right hip; lower back" (02/28/2017)  . Arthritis with psoriasis ([place])   . Borderline diabetes    DIET CONTROLLED - PT STATES HE IS NOT DIABETIC  . Carotid stenosis, bilateral PER DR EARLY / DUPLEX  09-09-10     40 - 59%   BILATERALLY--- ASYMPTOMATIC  . Chronic lower back pain   . Depression   . Dysrhythmia    a-fib  . GERD (gastroesophageal reflux disease)    CONTROLLED W/ PROTONIX  . History of gout   . HTN (hypertension)   . Hyperlipidemia   . Iron deficiency   . Lumbar spondylosis W/ RADICULOPATHY  . OSA on CPAP   . PAF (paroxysmal atrial fibrillation) ([place])   . Presence of permanent cardiac pacemaker DDD-  06-04-10-   DDD; SECONDARY TO SYNCOPY AND BRADYCARDIA  . Prostate cancer ([place]) 2008   S/P 51 RADIATION  [place]    . Restless leg syndrome   . Stroke (  [place]) Oct. 14, 2014   light left hand, balance issues, short term memory loss 2014  . SVT (supraventricular tachycardia) ([place])    S/P ABLATION   2008   Past Surgical History:  Past Surgical History:  Procedure Laterality Date  . BACK SURGERY    . BIV UPGRADE N/A 09/07/2017   Procedure: BIV PACEMAKER  UPGRADE;  Surgeon: Evans Lance, MD;  Location: [facility];  Service: Cardiovascular;  Laterality: N/A;  . BREAST SURGERY     "removed tumor; don't remember which side; years ago" (02/28/2017)  . CARDIAC ELECTROPHYSIOLOGY STUDY AND ABLATION  2008   FOR SVT  . CARDIAC PACEMAKER PLACEMENT  06-04-10   DDD  . CARDIOVERSION N/A 03/02/2017   Procedure: CARDIOVERSION;  Surgeon: Josue Hector, MD;  Location: [facility] ENDOSCOPY;  Service: Cardiovascular;  Laterality: N/A;  . CATARACT EXTRACTION W/ INTRAOCULAR LENS  IMPLANT, BILATERAL Bilateral   . CHOLECYSTECTOMY  2009  . ESOPHAGOGASTRODUODENOSCOPY N/A 12/12/2014   Procedure: ESOPHAGOGASTRODUODENOSCOPY (EGD);  Surgeon: Wilford Corner, MD;  Location: [facility] ENDOSCOPY;  Service: Endoscopy;  Laterality: N/A;  . INCISION AND DRAINAGE Right 1997   "knee replacement got infected"  . INGUINAL HERNIA REPAIR Left 06/2003    DONE WITH PENILE PROSTESIS SURG.  . INGUINAL HERNIA REPAIR Right 1962  . JOINT REPLACEMENT    . KNEE ARTHROSCOPY  06/02/2011   Procedure: ARTHROSCOPY KNEE;  Surgeon: Gearlean Alf;  Location: [facility];  Service: Orthopedics;  Laterality: Left;  LEFT KNEE ARTHROSCOPY WITH DEBRIDEMENT  . LOOP RECORDER PLACEMENT  01-30-10  . LOOP RECORDER REMOVAL  06/04/2010  . LUMBAR LAMINECTOMY/ DISKECTOMY/ FUSION  05-19-10   L4 - 5  . PENILE PROSTHESIS IMPLANT  06/2003   AND LEFT CORPOROPLASTY /    DONE INGUINAL REPAIR  . PROSTATE BIOPSY  2007  . REVISION TOTAL KNEE ARTHROPLASTY Right 1997  . TOTAL KNEE ARTHROPLASTY  12/20/2011   Procedure: TOTAL KNEE ARTHROPLASTY;  Surgeon: Gearlean Alf, MD;  Location: [facility];  Service: Orthopedics;  Laterality: Left;  . TOTAL KNEE ARTHROPLASTY Right 1997  . TOTAL SHOULDER ARTHROPLASTY Right 06/27/2015   Procedure: REVERSE TOTAL SHOULDER ARTHROPLASTY;  Surgeon: Netta Cedars, MD;  Location: [facility];  Service: Orthopedics;  Laterality: Right;  . TRANSURETHRAL RESECTION OF BLADDER TUMOR WITH GYRUS  (TURBT-GYRUS)  ?2018  . TRANSURETHRAL RESECTION OF PROSTATE  2006    Assessment & Plan Clinical Impression: 84 year old right-handed male with history of prostate cancer status post radiation, psoriatic arthritis maintained on methotrexate and chronic prednisone, PAF/pacemaker maintained on Eliquis as well as Tikosyn, prior CVA, chronic systolic congestive heart failure with ejection fraction 25%, mild dementia maintained on Aricept, hypertension as well as borderline diabetes mellitus. Per chart review patient lives with his wife. Used a rolling walker prior to admission. Reports patient had been essentially bedbound for the past week but was able to stand and complete a stand pivot transfer. Presented 12/23/2017 with altered mental status. In the ED noted fever as well as heart rate in the 150s. Placed on broad-spectrum antibiotics for suspected urosepsis. Chest x-ray showed some vascular congestion probable mild pulmonary edema, troponin 1.55, urine culture greater than 100,000 E. coli, lactic acid 2.6, creatinine 1.37. Patient developed V. tach arrest later that evening followed by cardiology services. Patient did receive CPR. Maintained on lidocaine drip from 7/6 to 12/27/2017. Echocardiogram ejection fraction 30% diffuse hypokinesis. Renal ultrasound no hydronephrosis. No further work-up indicated per cardiology services. CT of the abdomen with no acute  findings. Patient completed 7-day course of Rocephin 12/29/2017 for treatment of bacteremia/urosepsis per ID recommendation. Follow-up blood cultures no growth. Noted stage I pressure injury presented on admission with routine skin care. Physical and occupational therapy evaluations completed with recommendations of physical medicine rehab consult. This morning patient received oxycodone for low back pain and developed dizziness/nausea/difficulty staying awake. Wife states she has used 1/2 to 1/4 tab at home before for severe pain only. Patient was admitted for  a comprehensive rehab program..    Patient currently requires max with basic self-care skills secondary to muscle weakness, decreased cardiorespiratoy endurance, decreased coordination, decreased attention, decreased awareness, decreased problem solving, decreased safety awareness, decreased memory and delayed processing and decreased sitting balance, decreased standing balance, decreased postural control and decreased balance strategies.  Prior to hospitalization, patient could complete BADL with modified independent .  Patient will benefit from skilled intervention to decrease level of assist with basic self-care skills and increase independence with basic self-care skills prior to discharge home with care partner.  Anticipate patient will require 24 hour supervision and follow up home health.  OT - End of Session Activity Tolerance: Tolerates 10 - 20 min activity with multiple rests Endurance Deficit: Yes Endurance Deficit Description: pt requires prolonged rest breaks after all steps of ADL OT Assessment Rehab Potential (ACUTE ONLY): Good OT Patient demonstrates impairments in the following area(s): Balance;Cognition;Endurance;Pain;Safety;Skin Integrity OT Basic ADL's Functional Problem(s): Grooming;Bathing;Dressing;Toileting OT Transfers Functional Problem(s): Toilet;Tub/Shower OT Plan OT Intensity: Minimum of 1-2 x/day, 45 to 90 minutes OT Frequency: 5 out of 7 days OT Duration/Estimated Length of Stay: 18-21 OT Treatment/Interventions: Balance/vestibular training;Discharge planning;Pain management;Self Care/advanced ADL retraining;Therapeutic Activities;UE/LE Coordination activities;Cognitive remediation/compensation;Disease mangement/prevention;Functional mobility training;Patient/family education;Skin care/wound managment;Therapeutic Exercise;Community reintegration;DME/adaptive equipment instruction;Neuromuscular re-education;Psychosocial support;Splinting/orthotics;UE/LE Strength  taining/ROM;Wheelchair propulsion/positioning OT Self Feeding Anticipated Outcome(s): MOD I OT Basic Self-Care Anticipated Outcome(s): S OT Toileting Anticipated Outcome(s): S OT Bathroom Transfers Anticipated Outcome(s): S OT Recommendation Recommendations for Other Services: Therapeutic Recreation consult Therapeutic Recreation Interventions: Pet therapy Patient destination: Home Follow Up Recommendations: Home health OT Equipment Recommended: To be determined Equipment Details: pt reports having shower seat   Skilled Therapeutic Intervention 1;1. Pt and daughter present and educated on role/purpose of OT, CIR, ELOS and POC. Pt intermittently requires increased time for arousal/VC.  OT washes B lower legs/feet and pt able to wash peri area and thighs in bed level. OT dons pants, teds and non skid socks total A. Pt rolls B with MOD A and VC for hand placement on bed rails for OT to wash buttocks/apply butt cream per RN request/ advance pants past hips. Pt supine>sitting EOB with MAX A of 1. Pt sit to stand in stedy with MOD A for lifting to transfer into w/c. Pt requires increased time and VC for breathing techniques for rest breaks. Vitals assessed and WNL. Pt bathes UB with set up and cueing for sequencing. Pt grooms at sink with supervision. Pt completes UB dressing with min A to pull shirt down back. Exited session with pt seated in chair and daighter present to supervise with call light in reach and all needs met. RN aware of positioning  OT Evaluation Precautions/Restrictions  Precautions Precaution Comments: chest and ribs sore from CPR Restrictions Weight Bearing Restrictions: No Other Position/Activity Restrictions: self R UE limiting due to R flank pain and solid bump/mass at bottom of rib cage General Chart Reviewed: Yes Vital Signs  Pain Pain Assessment Pain Scale: 0-10 Pain Score: 7  Pain Type: Acute pain Pain Location: Back  Pain Orientation: Lower Pain Descriptors /  Indicators: Aching Pain Intervention(s): RN made aware Home Living/Prior Functioning Home Living Available Help at Discharge: Family, Available 24 hours/day Type of Home: House Home Access: Stairs to enter [employer] of Steps: 3 Entrance Stairs-Rails: Right, Left Home Layout: Able to live on main level with bedroom/bathroom Bathroom Shower/Tub: Multimedia programmer: Associate Professor Accessibility: Yes  Lives With: Spouse IADL History Homemaking Responsibilities: No Mode of Transportation: Family Type of Occupation: Chief Strategy Officer Leisure and Hobbies: golf Prior Function Vocation: Retired Comments: amb with rollator . Performed ADLs independently ADL   Vision Baseline Vision/History: Wears glasses Wears Glasses: Reading only Vision Assessment?: No apparent visual deficits Perception  Perception: Within Functional Limits Praxis Praxis: Intact Cognition Overall Cognitive Status: History of cognitive impairments - at baseline Arousal/Alertness: Awake/alert Orientation Level: Person;Place;Situation Person: Oriented Place: Oriented Situation: Oriented Year: 2019 Month: February Day of Week: Correct Memory: Impaired Memory Impairment: Decreased recall of new information;Retrieval deficit;Storage deficit Immediate Memory Recall: Sock;Blue;Bed Memory Recall: Bed;Blue Memory Recall Blue: With Cue Memory Recall Bed: With Cue Attention: Sustained Sustained Attention: Impaired Sustained Attention Impairment: Verbal basic;Functional basic Awareness: Appears intact Problem Solving: Impaired Problem Solving Impairment: Functional basic Executive Function: Initiating;Organizing Organizing: Impaired Organizing Impairment: Functional basic Initiating: Impaired Initiating Impairment: Functional basic Safety/Judgment: Impaired Sensation Sensation Light Touch: Impaired by gross assessment Coordination Gross Motor Movements are Fluid and Coordinated:  No Fine Motor Movements are Fluid and Coordinated: No Motor  Motor Motor: Within Functional Limits Mobility    Rolling B with MOD A MOD A lifting sit to stand in stedy Trunk/Postural Assessment  Cervical Assessment Cervical Assessment: Exceptions to WFL(head forward) Thoracic Assessment Thoracic Assessment: Exceptions to WFL(shoulders rounded) Lumbar Assessment Lumbar Assessment: Exceptions to WFL(posterior pelvic tilt) Postural Control Postural Control: Deficits on evaluation(delayed)  Balance Balance Balance Assessed: Yes Static Sitting Balance Static Sitting - Balance Support: No upper extremity supported;Feet supported Static Sitting - Level of Assistance: 5: Stand by assistance Static Standing Balance Static Standing - Balance Support: Right upper extremity supported;Left upper extremity supported Static Standing - Level of Assistance: 4: Min assist Static Standing - Comment/# of Minutes: standing in stedy Extremity/Trunk Assessment RUE Assessment RUE Assessment: Exceptions to [facility] Active Range of Motion (AROM) Comments: 3-/5 grossly LUE Assessment LUE Assessment: Exceptions to [facility] Active Range of Motion (AROM) Comments: 3-/5 grossly   See Function Navigator for Current Functional Status.   Refer to Care Plan for Long Term Goals  Recommendations for other services: Therapeutic Recreation  Pet therapy   Discharge Criteria: Patient will be discharged from OT if patient refuses treatment 3 consecutive times without medical reason, if treatment goals not met, if there is a change in medical status, if patient makes no progress towards goals or if patient is discharged from hospital.  The above assessment, treatment plan, treatment alternatives and goals were discussed and mutually agreed upon: by patient and by family  Tonny Branch 12/31/2017, 9:37 AM

## 2018-01-01 ENCOUNTER — Inpatient Hospital Stay (HOSPITAL_COMMUNITY): Payer: Self-pay

## 2018-01-01 NOTE — Progress Notes (Signed)
Occupational Therapy Session Note  Patient Details  Name: Christopher Baker MRN: [id number] Date of Birth: 07/29/1933  Today's Date: 01/01/2018 OT Individual Time: 0160-1093 OT Individual Time Calculation (min): 57 min    Short Term Goals: Week 1:  OT Short Term Goal 1 (Week 1): Pt will sit to stand at sink or RW with MOD A OT Short Term Goal 2 (Week 1): Pt will transfer to [facility] with MAX A of 1 OT Short Term Goal 3 (Week 1): Pt will complete functional activity for 5 min wiht no more than 1 rest break to demonstrate improved activity tolerance OT Short Term Goal 4 (Week 1): Pt will don shirt wiht supervision  Skilled Therapeutic Interventions/Progress Updates:    1:1. Pain in buttock reported, but not rated. Pt repositioned at end of session for comfort. Focus of session on toileting and sit to stand in stedy. Pt lethargic throughout session with OT applying teds, and socks. Pt supine>sitting EOB with MOD A for trunk elevation and sit to stand in stedy with MIN A to transfer into w/c. Pt vitals assessed (see fow chart). WNL. Pt reports needing to toilet. Utilizing stedy as stated above pt transfer to toilet to have BM. Pt able to lean laterally with cues to wipe, but requires total A overal for toileting/thoroughness. Pt completes hand washing with question cue to turn on water. Pt refusing to sit up in chair. Educated pt on building activity tolerance and OOB tolerance. Exited sesion with pt seated in bed, call light in reach and exit alarm on.  Therapy Documentation Precautions:  Precautions Precautions: Fall Precaution Comments: chest and ribs sore from CPR Restrictions Weight Bearing Restrictions: No Other Position/Activity Restrictions: self R UE limiting due to R flank pain and solid bump/mass at bottom of rib cage General:    See Function Navigator for Current Functional Status.   Therapy/Group: Individual Therapy  Tonny Branch 01/01/2018, 3:17 PM

## 2018-01-01 NOTE — Progress Notes (Signed)
Placed patient on home CPAP for the night  

## 2018-01-01 NOTE — Progress Notes (Signed)
[place] PHYSICAL MEDICINE & REHABILITATION     PROGRESS NOTE    Subjective/Complaints: Had a fair night.  No new issues.  ROS: Patient denies fever, rash, sore throat, blurred vision, nausea, vomiting, diarrhea, cough, shortness of breath or chest pain, joint or back pain, headache, or mood change.   Objective:  No results found. Recent Labs    12/30/17 0424  WBC 12.8*  HGB 10.1*  HCT 33.8*  PLT 274   Recent Labs    12/30/17 0424  NA 144  K 3.6  CL 105  GLUCOSE 116*  BUN 35*  CREATININE 1.57*  CALCIUM 9.1   CBG (last 3)  Recent Labs    12/29/17 2051 12/30/17 0803 12/30/17 1239  GLUCAP 137* 106* 116*    Wt Readings from Last 3 Encounters:  01/01/18 91.8 kg (202 lb 6.1 oz)  12/30/17 94.5 kg (208 lb 5.4 oz)  12/09/17 98.9 kg (218 lb)     Intake/Output Summary (Last 24 hours) at 01/01/2018 0848 Last data filed at 01/01/2018 0500 Gross per 24 hour  Intake 680 ml  Output 1500 ml  Net -820 ml    Vital Signs: Blood pressure 132/77, pulse 61, temperature 97.6 F (36.4 C), resp. rate 18, height 6\' 3"  (1.905 m), weight 91.8 kg (202 lb 6.1 oz), SpO2 96 %. Physical Exam:  Constitutional: No distress . Vital signs reviewed. HEENT: EOMI, oral membranes moist Neck: supple Cardiovascular: RRR without murmur. No JVD    Respiratory: CTA Bilaterally without wheezes or rales. Normal effort    GI: BS +, non-tender, non-distended   Neurological: A cranial nerve deficit is present.  Patient provides his name and age. More alert, still   confused. Follows simple commands. UE 2-3/5 prox to distal. LE: 2 /5 prox to 3/5 distally. Senses pain nin all 4's.  Stable motor and sensory exam Psychiatric:  Pleasant and cooperative but still slightly confused    Assessment/Plan: 1. Functional deficits secondary to urosepsis and subsequent encephalopathy and debility which require 3+ hours per day of interdisciplinary therapy in a comprehensive inpatient rehab  setting. Physiatrist is providing close team supervision and 24 hour management of active medical problems listed below. Physiatrist and rehab team continue to assess barriers to discharge/monitor patient progress toward functional and medical goals.  Function:  Bathing Bathing position   Position: Wheelchair/chair at sink(and bed for LB)  Bathing parts Body parts bathed by patient: Right arm, Left arm, Chest, Abdomen, Front perineal area, Right upper leg, Left upper leg Body parts bathed by helper: Buttocks, Right lower leg, Left lower leg, Back  Bathing assist Assist Level: Touching or steadying assistance(Pt > 75%)      Upper Body Dressing/Undressing Upper body dressing   What is the patient wearing?: Pull over shirt/dress     Pull over shirt/dress - Perfomed by patient: Thread/unthread right sleeve, Thread/unthread left sleeve, Put head through opening Pull over shirt/dress - Perfomed by helper: Pull shirt over trunk        Upper body assist Assist Level: Touching or steadying assistance(Pt > 75%)      Lower Body Dressing/Undressing Lower body dressing   What is the patient wearing?: Pants, Ted Hose, Non-skid slipper socks     Pants- Performed by patient: Thread/unthread right pants leg, Thread/unthread left pants leg, Pull pants up/down     Non-skid slipper socks- Performed by helper: Don/doff right sock, Don/doff left sock             TED Hose - Performed  by patient: Don/doff right TED hose, Don/doff left TED hose    Lower body assist Assist for lower body dressing: (total)      Toileting Toileting Toileting activity did not occur: No continent bowel/bladder event        Toileting assist     Transfers Chair/bed transfer   Chair/bed transfer method: Other Chair/bed transfer assist level: dependent (Pt equals 0%) Chair/bed transfer assistive device: Mechanical lift Mechanical lift: Ecologist     Max distance: 2'(able to take a  few steps) Assist level: Maximal assist (Pt 25 - 49%)   Wheelchair   Type: Manual Max wheelchair distance: 15' Assist Level: Touching or steadying assistance (Pt > 75%)  Cognition Comprehension Comprehension assist level: Follows basic conversation/direction with extra time/assistive device  Expression Expression assist level: Expresses basic needs/ideas: With extra time/assistive device  Social Interaction Social Interaction assist level: Interacts appropriately 50 - 74% of the time - May be physically or verbally inappropriate.  Problem Solving Problem solving assist level: Solves basic 50 - 74% of the time/requires cueing 25 - 49% of the time  Memory Memory assist level: Recognizes or recalls 50 - 74% of the time/requires cueing 25 - 49% of the time   Medical Problem List and Plan:  1. Decreased functional mobility secondary to encephalopathy due to urosepsis/multi-medical  -beginning therapies  -still not back to cognitive baseline 2. DVT Prophylaxis/Anticoagulation: Eliquis. Monitor for any bleeding episodes  3. Pain Management: Lidoderm patch  -avoid narcotics due to sedation and confusion -schedule tylenol 650mg  qid  -heat/ice prn  -consider low dose tramadol for severe pain if needed 4. Mood: Aricept 10 mg nightly, Cymbalta 30 mg nightly  5. Neuropsych: This patient is capable of making decisions on his own behalf.  6. Skin/Wound Care: Stage I sacral pressure injury. Routine skin checks  7. Fluids/Electrolytes/Nutrition: Routine in and outs with follow-up chemistries during admission  -encourage PO 8. ID. IV Rocephin completed 12/29/2017.  9. V. tach cardiac arrest with history of PAF/pacemaker. Patient currently off Tikosyn until follow-up outpatient with cardiology services.  10. Chronic systolic congestive heart failure. Monitor for any signs of fluid overload. Lasix 40 mg daily  -will change this to nights given bladder issues  -daily weights--- stable     Filed Weights    12/30/17 1552 12/31/17 0500 01/01/18 0435  Weight: 91.7 kg (202 lb 2.6 oz) 92.9 kg (204 lb 12.9 oz) 91.8 kg (202 lb 6.1 oz)     11. Psoriatic arthritis. Methotrexate and chronic prednisone currently on hold.    -will resume during this admit 12. Hypertension. Toprol-XL 25 mg daily. Monitor with increased mobility  13. Hyperlipidemia. Lipitor  14. CKD stage III. Follow-up chemistries durin admit 15. History of prostate cancer status post radiation. Patient on Flomax prior to admission 0.4 mg daily. Renal ultrasound no hydronephrosis. Follow-up outpatient Dr. Amalia Hailey  -will use condom catheter at nights for continence and urinary frequency which has affected sleep previously  16. Acute on chronic anemia. Continue iron supplement. Follow-up CBC    LOS (Days) 2 A FACE TO FACE EVALUATION WAS PERFORMED  [name], MD 01/01/2018 8:48 AM

## 2018-01-02 ENCOUNTER — Inpatient Hospital Stay (HOSPITAL_COMMUNITY): Payer: Self-pay | Admitting: Occupational Therapy

## 2018-01-02 ENCOUNTER — Inpatient Hospital Stay (HOSPITAL_COMMUNITY): Payer: Self-pay

## 2018-01-02 ENCOUNTER — Inpatient Hospital Stay (HOSPITAL_COMMUNITY): Payer: PPO | Admitting: Speech Pathology

## 2018-01-02 ENCOUNTER — Ambulatory Visit: Payer: Self-pay | Admitting: Rehabilitation

## 2018-01-02 DIAGNOSIS — N183 Chronic kidney disease, stage 3 unspecified: Secondary | ICD-10-CM

## 2018-01-02 DIAGNOSIS — E8809 Other disorders of plasma-protein metabolism, not elsewhere classified: Secondary | ICD-10-CM

## 2018-01-02 DIAGNOSIS — E46 Unspecified protein-calorie malnutrition: Secondary | ICD-10-CM

## 2018-01-02 DIAGNOSIS — D72829 Elevated white blood cell count, unspecified: Secondary | ICD-10-CM

## 2018-01-02 DIAGNOSIS — R350 Frequency of micturition: Secondary | ICD-10-CM

## 2018-01-02 DIAGNOSIS — F0390 Unspecified dementia without behavioral disturbance: Secondary | ICD-10-CM

## 2018-01-02 DIAGNOSIS — L4050 Arthropathic psoriasis, unspecified: Secondary | ICD-10-CM

## 2018-01-02 DIAGNOSIS — I5022 Chronic systolic (congestive) heart failure: Secondary | ICD-10-CM

## 2018-01-02 DIAGNOSIS — D62 Acute posthemorrhagic anemia: Secondary | ICD-10-CM

## 2018-01-02 LAB — URINALYSIS, ROUTINE W REFLEX MICROSCOPIC
BILIRUBIN URINE: NEGATIVE
Glucose, UA: NEGATIVE mg/dL
KETONES UR: NEGATIVE mg/dL
NITRITE: NEGATIVE
Protein, ur: 30 mg/dL — AB
Specific Gravity, Urine: 1.017 (ref 1.005–1.030)
pH: 6 (ref 5.0–8.0)

## 2018-01-02 LAB — CBC WITH DIFFERENTIAL/PLATELET
Abs Immature Granulocytes: 0.3 10*3/uL — ABNORMAL HIGH (ref 0.0–0.1)
BASOS ABS: 0.1 10*3/uL (ref 0.0–0.1)
Basophils Relative: 0 %
Eosinophils Absolute: 0.1 10*3/uL (ref 0.0–0.7)
Eosinophils Relative: 1 %
HEMATOCRIT: 33.3 % — AB (ref 39.0–52.0)
HEMOGLOBIN: 10 g/dL — AB (ref 13.0–17.0)
Immature Granulocytes: 2 %
LYMPHS PCT: 5 %
Lymphs Abs: 0.6 10*3/uL — ABNORMAL LOW (ref 0.7–4.0)
MCH: 27.2 pg (ref 26.0–34.0)
MCHC: 30 g/dL (ref 30.0–36.0)
MCV: 90.7 fL (ref 78.0–100.0)
MONO ABS: 1.1 10*3/uL — AB (ref 0.1–1.0)
Monocytes Relative: 8 %
Neutro Abs: 11.4 10*3/uL — ABNORMAL HIGH (ref 1.7–7.7)
Neutrophils Relative %: 84 %
Platelets: 433 10*3/uL — ABNORMAL HIGH (ref 150–400)
RBC: 3.67 MIL/uL — ABNORMAL LOW (ref 4.22–5.81)
RDW: 16.5 % — ABNORMAL HIGH (ref 11.5–15.5)
WBC: 13.5 10*3/uL — ABNORMAL HIGH (ref 4.0–10.5)

## 2018-01-02 LAB — COMPREHENSIVE METABOLIC PANEL
ALK PHOS: 106 U/L (ref 38–126)
ALT: 37 U/L (ref 0–44)
ANION GAP: 10 (ref 5–15)
AST: 36 U/L (ref 15–41)
Albumin: 2.1 g/dL — ABNORMAL LOW (ref 3.5–5.0)
BILIRUBIN TOTAL: 0.8 mg/dL (ref 0.3–1.2)
BUN: 30 mg/dL — ABNORMAL HIGH (ref 8–23)
CALCIUM: 9.6 mg/dL (ref 8.9–10.3)
CO2: 26 mmol/L (ref 22–32)
Chloride: 108 mmol/L (ref 98–111)
Creatinine, Ser: 1.39 mg/dL — ABNORMAL HIGH (ref 0.61–1.24)
GFR calc non Af Amer: 46 mL/min — ABNORMAL LOW (ref >60)
GFR, EST AFRICAN AMERICAN: 53 mL/min — AB (ref >60)
Glucose, Bld: 122 mg/dL — ABNORMAL HIGH (ref 70–99)
Potassium: 3.9 mmol/L (ref 3.5–5.1)
Sodium: 144 mmol/L (ref 135–145)
TOTAL PROTEIN: 5.6 g/dL — AB (ref 6.5–8.1)

## 2018-01-02 MED ORDER — PREMIER PROTEIN SHAKE
11.0000 [oz_av] | Freq: Three times a day (TID) | ORAL | Status: DC
  Administered 2018-01-02 – 2018-01-18 (×43): 11 [oz_av] via ORAL
  Filled 2018-01-02 (×54): qty 325.31

## 2018-01-02 MED ORDER — LIDOCAINE 5 % EX PTCH
1.0000 | MEDICATED_PATCH | CUTANEOUS | Status: DC
  Administered 2018-01-03 – 2018-01-04 (×2): 1 via TRANSDERMAL
  Filled 2018-01-02 (×2): qty 1

## 2018-01-02 MED ORDER — MENTHOL 3 MG MT LOZG
1.0000 | LOZENGE | OROMUCOSAL | Status: DC | PRN
  Filled 2018-01-02: qty 9

## 2018-01-02 MED ORDER — GERHARDT'S BUTT CREAM
TOPICAL_CREAM | Freq: Four times a day (QID) | CUTANEOUS | Status: DC
  Administered 2018-01-02 – 2018-01-10 (×29): via TOPICAL
  Administered 2018-01-10: 1 via TOPICAL
  Administered 2018-01-11 – 2018-01-18 (×29): via TOPICAL
  Filled 2018-01-02 (×3): qty 1

## 2018-01-02 MED ORDER — APIXABAN 5 MG PO TABS
5.0000 mg | ORAL_TABLET | Freq: Two times a day (BID) | ORAL | Status: DC
  Administered 2018-01-02 – 2018-01-18 (×32): 5 mg via ORAL
  Filled 2018-01-02 (×32): qty 1

## 2018-01-02 MED ORDER — CYANOCOBALAMIN 1000 MCG/ML IJ SOLN
1000.0000 ug | INTRAMUSCULAR | Status: DC
  Administered 2018-01-02: 1000 ug via SUBCUTANEOUS
  Filled 2018-01-02: qty 1

## 2018-01-02 MED ORDER — LIDOCAINE HCL URETHRAL/MUCOSAL 2 % EX GEL
CUTANEOUS | Status: DC | PRN
  Filled 2018-01-02: qty 5

## 2018-01-02 MED ORDER — FLUTICASONE PROPIONATE 50 MCG/ACT NA SUSP
1.0000 | Freq: Two times a day (BID) | NASAL | Status: DC
  Administered 2018-01-02 – 2018-01-18 (×29): 1 via NASAL
  Filled 2018-01-02: qty 16

## 2018-01-02 NOTE — Plan of Care (Signed)
  Problem: Consults Goal: RH GENERAL PATIENT EDUCATION Description See Patient Education module for education specifics. Outcome: Progressing Goal: Skin Care Protocol Initiated - if Braden Score 18 or less Description If consults are not indicated, leave blank or document N/A Outcome: Progressing   Problem: RH BOWEL ELIMINATION Goal: RH STG MANAGE BOWEL WITH ASSISTANCE Description STG Manage Bowel with mod Assistance.  Outcome: Progressing Goal: RH STG MANAGE BOWEL W/MEDICATION W/ASSISTANCE Description STG Manage Bowel with Medication with mod Assistance.  Outcome: Progressing Goal: RH STG MANAGE BOWEL W/EQUIPMENT W/ASSISTANCE Description STG Manage Bowel With Equipment With Assistance Outcome: Progressing Goal: RH OTHER STG BOWEL ELIMINATION GOALS W/ASSIST Description Other STG Bowel Elimination Goals With Assistance. Outcome: Progressing   Problem: RH BLADDER ELIMINATION Goal: RH STG MANAGE BLADDER WITH ASSISTANCE Description STG Manage Bladder With mod Assistance  Outcome: Progressing Goal: RH STG MANAGE BLADDER WITH MEDICATION WITH ASSISTANCE Description STG Manage Bladder With Medication With mod Assistance.  Outcome: Progressing Goal: RH STG MANAGE BLADDER WITH EQUIPMENT WITH ASSISTANCE Description STG Manage Bladder With Equipment With mod Assistance  Outcome: Progressing   Problem: RH SKIN INTEGRITY Goal: RH STG SKIN FREE OF INFECTION/BREAKDOWN Description With mod assistance  Outcome: Progressing Goal: RH STG MAINTAIN SKIN INTEGRITY WITH ASSISTANCE Description STG Maintain Skin Integrity With mod Assistance.  Outcome: Progressing Goal: RH STG ABLE TO PERFORM INCISION/WOUND CARE W/ASSISTANCE Description STG Able To Perform Incision/Wound Care With max Assistance.  Outcome: Progressing   Problem: RH SAFETY Goal: RH STG ADHERE TO SAFETY PRECAUTIONS W/ASSISTANCE/DEVICE Description STG Adhere to Safety Precautions With min Assistance/Device.  Outcome:  Progressing   Problem: RH PAIN MANAGEMENT Goal: RH STG PAIN MANAGED AT OR BELOW PT'S PAIN GOAL Description <2 out of 10 on a scale from 1-10  Outcome: Progressing

## 2018-01-02 NOTE — Progress Notes (Signed)
Speech Language Pathology Daily Session Note  Patient Details  Name: Christopher Baker MRN: [id number] Date of Birth: Feb 16, 1934  Today's Date: 01/02/2018 SLP Individual Time: 6283-6629 SLP Individual Time Calculation (min): 15 min and Today's Date: 01/02/2018 SLP Missed Time: 60 Minutes Missed Time Reason: Patient fatigue  Short Term Goals: Week 1: SLP Short Term Goal 1 (Week 1): Pt will use external aids to facilitate recall of daily information with min assist.   SLP Short Term Goal 2 (Week 1): Pt will use environmental cues to facilitate orientation to date and situation with min assist.  SLP Short Term Goal 3 (Week 1): Pt will complete mildly complex tasks with min assist for functional problem solving.   SLP Short Term Goal 4 (Week 1): Pt will sustain his attention to functional tasks for 15 min intervals with min cues for redirection.    Skilled Therapeutic Interventions: Skilled treatment session focused on cognitive goals. Upon arrival, patient was asleep in bed with PA present. Both the PA and the patient's wife report patient has been exteremly lethargic this afternoon. SLP provided Max A multimodal cues for arousal for ~30 second intervals and was unable to complete a sentence without falling asleep. RN and PA aware. Patient missed remaining 45 minutes of session due to fatigue limiting his ability to participate. Patient left supine in bed with alarm on. Continue with current plan of care.      Function:   Cognition Comprehension Comprehension assist level: Understands basic 25 - 49% of the time/ requires cueing 50 - 75% of the time  Expression   Expression assist level: Expresses basic 25 - 49% of the time/requires cueing 50 - 75% of the time. Uses single words/gestures.  Social Interaction Social Interaction assist level: Interacts appropriately 25 - 49% of time - Needs frequent redirection.  Problem Solving Problem solving assist level: Solves basic 25 - 49% of the time - needs  direction more than half the time to initiate, plan or complete simple activities  Memory Memory assist level: Recognizes or recalls 25 - 49% of the time/requires cueing 50 - 75% of the time    Pain Pain Assessment Pain Scale: Faces Pain Score: Asleep Faces Pain Scale: Hurts a little bit Pain Type: Chronic pain Pain Location: Back Pain Orientation: Lower Pain Descriptors / Indicators: Discomfort Pain Onset: On-going Pain Intervention(s): Repositioned  Therapy/Group: Individual Therapy  Takeyah Wieman 01/02/2018, 2:15 PM

## 2018-01-02 NOTE — Care Management (Signed)
Inpatient [place] Individual Statement of Services  Patient Name:  Christopher Baker  Date:  01/02/2018  Welcome to the [facility].  Our goal is to provide you with an individualized program based on your diagnosis and situation, designed to meet your specific needs.  With this comprehensive rehabilitation program, you will be expected to participate in at least 3 hours of rehabilitation therapies Monday-Friday, with modified therapy programming on the weekends.  Your rehabilitation program will include the following services:  Physical Therapy (PT), Occupational Therapy (OT), Speech Therapy (ST), 24 hour per day rehabilitation nursing, Therapeutic Recreaction (TR), Neuropsychology, Case Management (Social Worker), Rehabilitation Medicine, Nutrition Services and Pharmacy Services  Weekly team conferences will be held on Wednesdays to discuss your progress.  Your Social Worker will talk with you frequently to get your input and to update you on team discussions.  Team conferences with you and your family in attendance may also be held.  Expected length of stay: 18-21 days   Overall anticipated outcome: supervision  Depending on your progress and recovery, your program may change. Your Social Worker will coordinate services and will keep you informed of any changes. Your Social Worker's name and contact numbers are listed  below.  The following services may also be recommended but are not provided by the [facility] will be made to provide these services after discharge if needed.  Arrangements include referral to agencies that provide these services.  Your insurance has been verified to be:  [employer] Your primary doctor is:  Dr. Osborne Casco  Pertinent information will be shared with your doctor and your insurance  company.  Social Worker:  [place], [place] or [phone]   Information discussed with and copy given to patient by: Lennart Pall, 01/02/2018, 11:19 AM

## 2018-01-02 NOTE — Progress Notes (Signed)
Occupational Therapy Session Note  Patient Details  Name: Christopher Baker MRN: [id number] Date of Birth: November 28, 1933  Today's Date: 01/02/2018 OT Individual Time: 0900-0930 OT Individual Time Calculation (min): 30 min    Short Term Goals: Week 1:  OT Short Term Goal 1 (Week 1): Pt will sit to stand at sink or RW with MOD A OT Short Term Goal 2 (Week 1): Pt will transfer to [facility] with MAX A of 1 OT Short Term Goal 3 (Week 1): Pt will complete functional activity for 5 min wiht no more than 1 rest break to demonstrate improved activity tolerance OT Short Term Goal 4 (Week 1): Pt will don shirt wiht supervision  Skilled Therapeutic Interventions/Progress Updates:    Pt received in bed just after his PT session. PT reported that pt was very fatigued after donning pants.  Pt agreeable to bathing and dressing UB.  Pt adjusted in bed with facilitation of pt working on bridging his hips and pushing up to move higher up on the bed. He was able to bathe UB with set up and then don shirt with A to pull over his back.  He worked on rolling from side to side in the bed for pulling the shirt down. Pt would become fatigued easily and needed frequent rest breaks.  Pt resting in bed with bed alarm set and all needs met.  Therapy Documentation Precautions:  Precautions Precautions: Fall Precaution Comments: chest and ribs sore from CPR Restrictions Weight Bearing Restrictions: No Other Position/Activity Restrictions: self R UE limiting due to R flank pain and solid bump/mass at bottom of rib cage  Pain: Pain Assessment Pain Scale: 0-10 Pain Score: 7 (working with therapy) Pain Type: Chronic pain Pain Location: Back Pain Orientation: Lower Pain Descriptors / Indicators: Constant Pain Onset: On-going Patients Stated Pain Goal: 4 Pain Intervention(s): Medication (See eMAR) ADL:    See Function Navigator for Current Functional Status.   Therapy/Group: Individual Therapy  [place] 01/02/2018,  10:08 AM

## 2018-01-02 NOTE — IPOC Note (Signed)
Overall Plan of Care [facility]) Patient Details Name: Christopher Baker MRN: [id number] DOB: 09/06/1933  Admitting Diagnosis: <principal problem not specified>  Hospital Problems: Active Problems:   History of prostate cancer   Chronic systolic congestive heart failure (HCC)   Encephalopathy   Dementia without behavioral disturbance   Chronic systolic (congestive) heart failure (HCC)   Psoriatic arthritis (HCC)   Stage 3 chronic kidney disease (HCC)   Urinary frequency   Acute blood loss anemia   Hypoalbuminemia due to protein-calorie malnutrition ([place])     Functional Problem List: Nursing Bladder, Pain, Bowel, Endurance  PT Balance, Endurance, Pain, Safety, Sensory, Edema  OT Balance, Cognition, Endurance, Pain, Safety, Skin Integrity  SLP Cognition  TR         Basic ADL's: OT Grooming, Bathing, Dressing, Toileting     Advanced  ADL's: OT       Transfers: PT Bed Mobility, Bed to Chair, Car, Furniture, Futures trader, Metallurgist: PT Ambulation, Emergency planning/management officer, Stairs     Additional Impairments: OT    SLP Social Cognition   Problem Solving, Memory, Attention  TR      Anticipated Outcomes Item Anticipated Outcome  Self Feeding MOD I  Swallowing      Basic self-care  S  Toileting  S   Bathroom Transfers S  Bowel/Bladder  pt to be continent x 2, LBM 07/11  Transfers  Supervision  Locomotion  TBD (w/c vs RW)  Communication     Cognition  supervision   Pain  less than 2  Safety/Judgment  to remain fall free   Therapy Plan: PT Intensity: Minimum of 1-2 x/day ,45 to 90 minutes PT Frequency: 5 out of 7 days PT Duration Estimated Length of Stay: 18-21 days OT Intensity: Minimum of 1-2 x/day, 45 to 90 minutes OT Frequency: 5 out of 7 days OT Duration/Estimated Length of Stay: 18-21 SLP Intensity: Minumum of 1-2 x/day, 30 to 90 minutes SLP Frequency: 3 to 5 out of 7 days SLP Duration/Estimated Length of Stay: 21 days     Team  Interventions: Nursing Interventions Patient/Family Education, Disease Management/Prevention, Discharge Planning, Bladder Management, Pain Management, Bowel Management  PT interventions Ambulation/gait training, Balance/vestibular training, Cognitive remediation/compensation, Community reintegration, Discharge planning, Disease management/prevention, DME/adaptive equipment instruction, Functional mobility training, Pain management, Patient/family education, Psychosocial support, Stair training, Therapeutic Activities, Therapeutic Exercise, UE/LE Strength taining/ROM, UE/LE Coordination activities, Wheelchair propulsion/positioning  OT Interventions Balance/vestibular training, Discharge planning, Pain management, Self Care/advanced ADL retraining, Therapeutic Activities, UE/LE Coordination activities, Cognitive remediation/compensation, Disease mangement/prevention, Functional mobility training, Patient/family education, Skin care/wound managment, Therapeutic Exercise, Community reintegration, Engineer, drilling, Neuromuscular re-education, Psychosocial support, Splinting/orthotics, UE/LE Strength taining/ROM, Wheelchair propulsion/positioning  SLP Interventions Cognitive remediation/compensation, English as a second language teacher, Functional tasks, Patient/family education, Internal/external aids, Environmental controls  TR Interventions    SW/CM Interventions Discharge Planning, Psychosocial Support, Patient/Family Education   Barriers to Discharge MD  Medical stability and Cognition  Nursing Incontinence    PT Medical stability, Home environment access/layout    OT      SLP      SW       Team Discharge Planning: Destination: PT-Home ,OT- Home , SLP-Home Projected Follow-up: PT-Home health PT, OT-  Home health OT, SLP-Home Health SLP, 24 hour supervision/assistance, Outpatient SLP Projected Equipment Needs: PT-To be determined, OT- To be determined, SLP-None recommended by SLP Equipment  Details: PT- , OT-pt reports having shower seat Patient/family involved in discharge planning: PT- Patient, Family member/caregiver,  OT-Patient,  Family member/caregiver, SLP-Patient  MD ELOS: 14-17 days. Medical Rehab Prognosis:  Good Assessment: 84 year old right-handed male with history of prostate cancer status post radiation, psoriatic arthritis maintained on methotrexate and chronic prednisone, PAF/pacemaker maintained on Eliquis as well as Tikosyn, prior CVA, chronic systolic congestive heart failure with ejection fraction 25%, mild dementia maintained on Aricept, hypertension as well as borderline diabetes mellitus. Reports patient had been essentially bedbound for the past week but was able to stand and complete a stand pivot transfer. Presented 12/23/2017 with altered mental status. In the ED noted fever as well as heart rate in the 150s. Placed on broad-spectrum antibiotics for suspected urosepsis. Chest x-ray showed some vascular congestion probable mild pulmonary edema, troponin 1.55, urine culture greater than 100,000 E. coli, lactic acid 2.6, creatinine 1.37. Patient developed V. tach arrest later that evening followed by cardiology services. Patient did receive CPR. Maintained on lidocaine drip from 7/6 to 12/27/2017. Echocardiogram ejection fraction 30% diffuse hypokinesis. Renal ultrasound no hydronephrosis. No further work-up indicated per cardiology services. CT of the abdomen with no acute findings. Patient completed 7-day course of Rocephin 12/29/2017 for treatment of bacteremia/urosepsis per ID recommendation. Follow-up blood cultures no growth. Noted stage I pressure injury presented on admission with routine skin care. Patient received oxycodone for low back pain and developed dizziness/nausea/difficulty staying awake. Wife states she has used 1/2 to 1/4 tab at home before for severe pain only. Patient with resultant functional deficits with mobility, cognition, self-care. Will set goals for  supervision with PT/OT/SLP.  See Team Conference Notes for weekly updates to the plan of care

## 2018-01-02 NOTE — Progress Notes (Signed)
Social Work  Social Work Assessment and Plan  Patient Details  Name: Christopher Baker MRN: [id number] Date of Birth: Dec 04, 1933  Today's Date: 01/02/2018  Problem List:  Patient Active Problem List   Diagnosis Date Noted  . Dementia without behavioral disturbance   . Chronic systolic (congestive) heart failure ([place])   . Psoriatic arthritis ([place])   . Stage 3 chronic kidney disease ([place])   . Urinary frequency   . Acute blood loss anemia   . Hypoalbuminemia due to protein-calorie malnutrition ([place])   . Encephalopathy 12/30/2017  . Atrial fibrillation with rapid ventricular response ([place])   . Acute respiratory failure with hypoxemia ([place])   . Aspiration pneumonia of right upper lobe due to gastric secretions ([place])   . Drug-induced torsades de pointes 12/24/2017  . Sepsis ([place]) 12/23/2017  . Ventricular tachycardia ([place]) 12/23/2017  . Pressure injury of skin 12/23/2017  . E coli bacteremia 12/23/2017  . Urinary tract infection 12/23/2017  . Acute pulmonary edema ([place]) 12/23/2017  . Sepsis, unspecified organism ([place]) 10/30/2017  . Acute urinary retention 10/30/2017  . Chronic systolic congestive heart failure ([place]) 09/07/2017  . S/P shoulder replacement 06/27/2015  . Residual cognitive deficit as late effect of stroke 04/15/2015  . Lung nodule   . Weakness generalized 12/11/2014  . Leukocytosis 12/11/2014  . Restless leg syndrome 12/11/2014  . Normocytic anemia 12/11/2014  . Abnormal EKG 12/11/2014  . Gait disturbance, post-stroke 10/22/2014  . Partial seizures ([place]) 06/18/2014  . Vitamin B12 deficiency 06/18/2014  . OSA (obstructive sleep apnea) 02/26/2014  . Cognitive deficits as late effect of cerebrovascular disease 01/11/2014  . Fatigue 09/20/2013  . Intermittent lightheadedness 06/19/2013  . Low blood pressure, not hypotension 06/19/2013  . Vascular dementia 04/09/2013  . History of completed stroke 04/03/2013  . Occlusion and stenosis of carotid artery without mention  of cerebral infarction 11/21/2012  . OA (osteoarthritis) of knee 12/20/2011  . Methotrexate, long term, current use 09/10/2011  . Knee pain, left 07/27/2011  . Pacemaker-Medtronic 09/08/2010  . UNSPECIFIED VISUAL DISTURBANCE 07/20/2010  . IRON DEFIC ANEMIA [address] DIET IRON INTAKE 04/20/2010  . PSORIASIS 04/20/2010  . DISUSE OSTEOPOROSIS 05/12/2009  . SPONDYLOSIS, LUMBAR, WITH RADICULOPATHY 10/15/2008  . OTHER SPECIFIED DERMATOMYCOSES 06/24/2008  . VARICOSE VEINS LOWER EXTREMITIES W/INFLAMMATION 12/25/2007  . INSOMNIA WITH SLEEP APNEA UNSPECIFIED 08/31/2007  . GERD 03/23/2007  . Atrial fibrillation, chronic ([place]) 01/31/2007  . PSORIATIC ARTHRITIS 01/31/2007  . Hyperlipidemia 01/19/2007  . Essential hypertension 01/19/2007  . History of prostate cancer 01/19/2007   Past Medical History:  Past Medical History:  Diagnosis Date  . Anxiety   . Arthritis    "hands, right hip; lower back" (02/28/2017)  . Arthritis with psoriasis ([place])   . Borderline diabetes    DIET CONTROLLED - PT STATES HE IS NOT DIABETIC  . Carotid stenosis, bilateral PER DR EARLY / DUPLEX  09-09-10     40 - 59%   BILATERALLY--- ASYMPTOMATIC  . Chronic lower back pain   . Depression   . Dysrhythmia    a-fib  . GERD (gastroesophageal reflux disease)    CONTROLLED W/ PROTONIX  . History of gout   . HTN (hypertension)   . Hyperlipidemia   . Iron deficiency   . Lumbar spondylosis W/ RADICULOPATHY  . OSA on CPAP   . PAF (paroxysmal atrial fibrillation) ([place])   . Presence of permanent cardiac pacemaker DDD-  06-04-10-   DDD; SECONDARY TO SYNCOPY AND BRADYCARDIA  . Prostate cancer [facility]) 2008  S/P 40 RADIATION  [place]    . Restless leg syndrome   . Stroke [facility]) Oct. 14, 2014   light left hand, balance issues, short term memory loss 2014  . SVT (supraventricular tachycardia) ([place])    S/P ABLATION   2008   Past Surgical History:  Past Surgical History:  Procedure Laterality Date  . BACK SURGERY    . BIV UPGRADE  N/A 09/07/2017   Procedure: BIV PACEMAKER UPGRADE;  Surgeon: Evans Lance, MD;  Location: [place] [facility];  Service: Cardiovascular;  Laterality: N/A;  . BREAST SURGERY     "removed tumor; don't remember which side; years ago" (02/28/2017)  . CARDIAC ELECTROPHYSIOLOGY STUDY AND ABLATION  2008   FOR SVT  . CARDIAC PACEMAKER PLACEMENT  06-04-10   DDD  . CARDIOVERSION N/A 03/02/2017   Procedure: CARDIOVERSION;  Surgeon: Josue Hector, MD;  Location: [facility] ENDOSCOPY;  Service: Cardiovascular;  Laterality: N/A;  . CATARACT EXTRACTION W/ INTRAOCULAR LENS  IMPLANT, BILATERAL Bilateral   . CHOLECYSTECTOMY  2009  . ESOPHAGOGASTRODUODENOSCOPY N/A 12/12/2014   Procedure: ESOPHAGOGASTRODUODENOSCOPY (EGD);  Surgeon: Wilford Corner, MD;  Location: [facility] ENDOSCOPY;  Service: Endoscopy;  Laterality: N/A;  . INCISION AND DRAINAGE Right 1997   "knee replacement got infected"  . INGUINAL HERNIA REPAIR Left 06/2003    DONE WITH PENILE PROSTESIS SURG.  . INGUINAL HERNIA REPAIR Right 1962  . JOINT REPLACEMENT    . KNEE ARTHROSCOPY  06/02/2011   Procedure: ARTHROSCOPY KNEE;  Surgeon: Gearlean Alf;  Location: [place];  Service: Orthopedics;  Laterality: Left;  LEFT KNEE ARTHROSCOPY WITH DEBRIDEMENT  . LOOP RECORDER PLACEMENT  01-30-10  . LOOP RECORDER REMOVAL  06/04/2010  . LUMBAR LAMINECTOMY/ DISKECTOMY/ FUSION  05-19-10   L4 - 5  . PENILE PROSTHESIS IMPLANT  06/2003   AND LEFT CORPOROPLASTY /    DONE INGUINAL REPAIR  . PROSTATE BIOPSY  2007  . REVISION TOTAL KNEE ARTHROPLASTY Right 1997  . TOTAL KNEE ARTHROPLASTY  12/20/2011   Procedure: TOTAL KNEE ARTHROPLASTY;  Surgeon: Gearlean Alf, MD;  Location: [facility] ORS;  Service: Orthopedics;  Laterality: Left;  . TOTAL KNEE ARTHROPLASTY Right 1997  . TOTAL SHOULDER ARTHROPLASTY Right 06/27/2015   Procedure: REVERSE TOTAL SHOULDER ARTHROPLASTY;  Surgeon: Netta Cedars, MD;  Location: [place];  Service: Orthopedics;  Laterality: Right;  .  TRANSURETHRAL RESECTION OF BLADDER TUMOR WITH GYRUS (TURBT-GYRUS)  ?2018  . TRANSURETHRAL RESECTION OF PROSTATE  2006   Social History:  reports that he quit smoking about 19 years ago. His smoking use included cigarettes. He has a 45.00 pack-year smoking history. He has never used smokeless tobacco. He reports that he drinks alcohol. He reports that he does not use drugs.  Family / Support Systems Marital Status: Married Patient Roles: Spouse, Parent Spouse/Significant Other: wife, Tilman Mcclaren (H) [phone] or (C) [phone] Children: Pt with two adult children:  son, living in [place], [place] and a daughter in [place]. Anticipated Caregiver: wife Ability/Limitations of Caregiver: No limitations Caregiver Availability: 24/7 Family Dynamics: Pt describes wife as very supportive.  Wife encouraging to pt and very loving to him during interview.  Notes adult children are also supportive.  Social History Preferred language: English Religion: [employer] Cultural Background: NA Read: Yes Write: Yes Employment Status: Retired Freight forwarder Issues: None Guardian/Conservator: None - per MD, pt is not capable of making decisions on his own behalf.   Abuse/Neglect Abuse/Neglect Assessment Can Be Completed: Yes Physical Abuse: Denies Verbal  Abuse: Denies Sexual Abuse: Denies Exploitation of patient/patient's resources: Denies Self-Neglect: Denies  Emotional Status Pt's affect, behavior adn adjustment status: Pt lying in bed and appears fatigued from therapy sessions this morning.  Answers questions appropriately, however, admits he has little understanding of what medical issues were that brought him to hospital.  Pt denies and does not appear to be in any significant emotional distress.  Will likely refer for neuropsychology consult to further assess cognition in addition to ST. Recent Psychosocial Issues: None Pyschiatric History: None Substance Abuse History:  None  Patient / Family Perceptions, Expectations & Goals Pt/Family understanding of illness & functional limitations: Pt states, "I just know I passed out.  I had a terrible infection."  Wife with good understanding of medical issues, current limitations/ need for CIR. Premorbid pt/family roles/activities: Pt independent PTA Anticipated changes in roles/activities/participation: Per goals, wife will need to provide primary caregiver assist/ supervision Pt/family expectations/goals: "I just need my strength back."  [employer]: None Premorbid Home Care/DME Agencies: None Transportation available at discharge: yes Resource referrals recommended: Neuropsychology  Discharge Planning Living Arrangements: Spouse/significant other Support Systems: Spouse/significant other, Children Type of Residence: Private residence Insurance Resources: Medicare([employer]) Museum/gallery curator Resources: [place] Referred: No Living Expenses: Own Money Management: Spouse Does the patient have any problems obtaining your medications?: No Home Management: mostly wife managing the home. Patient/Family Preliminary Plans: Pt to return home with wife providing 24/7 assistance. Social Work Anticipated Follow Up Needs: HH/OP Expected length of stay: 18-21 days  Clinical Impression Pleasant gentleman here with encephalopathy and debility due to sepsis.  Wife at bedside and very supportive.  Pt able to answer basic, personal information questions.  They both deny any significant emotional distress for pt.  They are hopeful he can reach the targeted supervision goals.  Will follow for support and d/c planning needs.  Farris Geiman 01/02/2018, 2:33 PM

## 2018-01-02 NOTE — Progress Notes (Signed)
Physical Therapy Session Note  Patient Details  Name: Christopher Baker MRN: [id number] Date of Birth: 19-May-1934  Today's Date: 01/02/2018 PT Individual Time: 0800-0900 PT Individual Time Calculation (min): 60 min   Short Term Goals: Week 1:  PT Short Term Goal 1 (Week 1): Pt will perform squat pivot transfer bed to/from w/c with max A PT Short Term Goal 2 (Week 1): Pt will ambulate x 10 ft with LRAD and max A PT Short Term Goal 3 (Week 1): Pt will propel manual w/c x 50 ft with min A  Skilled Therapeutic Interventions/Progress Updates:   Pt propped in bed, eating breakfast; closing his eyes and appearing to doze intermittently.  Pt declined sitting EOB to finish eating.    Pt presents with severe edema bil feet; he is using SCDs at night, so no edema present in LLs.  PT provided retrograde massage, instructing pt, to decrease edema bil before donning TEDS.  PT donned pants with minimal assistance from pt; he was DOE from  rolling x 1 R/L during this activity.  Supine:  PROM L Iliotibial band in hook lying.  Pt performed 10 x 2 active assistive : bil bridging, lower trunk rotation, R/L heel slides, active bil hip internal rotation.  Pt left resting in bed with alarm set and needs at hand.  Pt limited in participation by deconditioning and pain.      Therapy Documentation Precautions:  Precautions Precautions: Fall Precaution Comments: chest and ribs sore from CPR Restrictions Weight Bearing Restrictions: No Other Position/Activity Restrictions: self R UE limiting due to R flank pain and solid bump/mass at bottom of rib cage  Pain: 9/10 "tailbone and chest" (due to CPR); premedicated Pain Assessment Pain Scale: 0-10 Pain Score: 10 Pain Type: Chronic pain Pain Location: Back Pain Orientation: Lower Also chest pain due to COR  Pain Intervention(s): Medication (See eMAR) Whitney, RN stated that he has had a pain patch on LB, but it was removed during pt's BM this AM       See Function Navigator for Current Functional Status.   Therapy/Group: Individual Therapy  Zamarah Ullmer 01/02/2018, 9:56 AM

## 2018-01-02 NOTE — Progress Notes (Signed)
Patient information reviewed and entered into eRehab system by Daiva Nakayama, RN, CRRN, [place] Coordinator.  Information including medical coding and functional independence measure will be reviewed and updated through discharge.     Per nursing patient/spouse was given "Data Collection Information Summary for Patients in Inpatient Rehabilitation Facilities with attached "Privacy Act [place] Records" upon admission.

## 2018-01-02 NOTE — Progress Notes (Signed)
[facility] PHYSICAL MEDICINE & REHABILITATION     PROGRESS NOTE    Subjective/Complaints: Patient seen sitting in bed this morning. States he was in pain overnight. He states he was in pain over the weekend.  ROS: +Generalized pain. Denies cardiac CP, SOB, nausea, vomiting, diarrhea.  Objective:  No results found. Recent Labs    01/02/18 0759  WBC 13.5*  HGB 10.0*  HCT 33.3*  PLT 433*   Recent Labs    01/02/18 0759  NA 144  K 3.9  CL 108  GLUCOSE 122*  BUN 30*  CREATININE 1.39*  CALCIUM 9.6   CBG (last 3)  Recent Labs    12/30/17 1239  GLUCAP 116*    Wt Readings from Last 3 Encounters:  01/02/18 93.7 kg (206 lb 9.1 oz)  12/30/17 94.5 kg (208 lb 5.4 oz)  12/09/17 98.9 kg (218 lb)     Intake/Output Summary (Last 24 hours) at 01/02/2018 0852 Last data filed at 01/02/2018 0600 Gross per 24 hour  Intake 390 ml  Output 2026 ml  Net -1636 ml    Vital Signs: Blood pressure 136/83, pulse (!) 59, temperature (!) 97.5 F (36.4 C), temperature source Oral, resp. rate 17, height 6\' 3"  (1.905 m), weight 93.7 kg (206 lb 9.1 oz), SpO2 95 %. Physical Exam:  Constitutional: No distress . Vital signs reviewed. HENT: Normocephalic.  Atraumatic. Eyes: EOMI. No discharge. Cardiovascular: RRR. No JVD. Respiratory: CTA Bilaterally. Normal effort. GI: BS +. Non-distended. Musc: No edema or tenderness in extremities. Neurological:  Alert and oriented 2 Follows simple commands.  Motor: B/l UE 4-/5 prox to distal.  B/l LE: 3+/5 proximal to distal. Psychiatric: Pleasant and cooperative but still slightly confused and slowed  Assessment/Plan: 1. Functional deficits secondary to urosepsis and subsequent encephalopathy and debility which require 3+ hours per day of interdisciplinary therapy in a comprehensive inpatient rehab setting. Physiatrist is providing close team supervision and 24 hour management of active medical problems listed below. Physiatrist and rehab team  continue to assess barriers to discharge/monitor patient progress toward functional and medical goals.  Function:  Bathing Bathing position   Position: Wheelchair/chair at sink(and bed for LB)  Bathing parts Body parts bathed by patient: Right arm, Left arm, Chest, Abdomen, Front perineal area, Right upper leg, Left upper leg Body parts bathed by helper: Buttocks, Right lower leg, Left lower leg, Back  Bathing assist Assist Level: Touching or steadying assistance(Pt > 75%)      Upper Body Dressing/Undressing Upper body dressing   What is the patient wearing?: Pull over shirt/dress     Pull over shirt/dress - Perfomed by patient: Thread/unthread right sleeve, Thread/unthread left sleeve, Put head through opening Pull over shirt/dress - Perfomed by helper: Pull shirt over trunk        Upper body assist Assist Level: Touching or steadying assistance(Pt > 75%)      Lower Body Dressing/Undressing Lower body dressing   What is the patient wearing?: Pants, Ted Hose, Non-skid slipper socks     Pants- Performed by patient: Thread/unthread right pants leg, Thread/unthread left pants leg, Pull pants up/down     Non-skid slipper socks- Performed by helper: Don/doff right sock, Don/doff left sock             TED Hose - Performed by patient: Don/doff right TED hose, Don/doff left TED hose    Lower body assist Assist for lower body dressing: (total)      Toileting Toileting Toileting activity did not occur: No  continent bowel/bladder event        Toileting assist     Transfers Chair/bed transfer   Chair/bed transfer method: Other Chair/bed transfer assist level: dependent (Pt equals 0%) Chair/bed transfer assistive device: Mechanical lift Mechanical lift: Ecologist     Max distance: 2'(able to take a few steps) Assist level: Maximal assist (Pt 25 - 49%)   Wheelchair   Type: Manual Max wheelchair distance: 15' Assist Level: Touching or steadying  assistance (Pt > 75%)  Cognition Comprehension Comprehension assist level: Follows basic conversation/direction with extra time/assistive device  Expression Expression assist level: Expresses basic needs/ideas: With extra time/assistive device  Social Interaction Social Interaction assist level: Interacts appropriately 50 - 74% of the time - May be physically or verbally inappropriate.  Problem Solving Problem solving assist level: Solves basic 50 - 74% of the time/requires cueing 25 - 49% of the time  Memory Memory assist level: Recognizes or recalls 50 - 74% of the time/requires cueing 25 - 49% of the time   Medical Problem List and Plan:  1. Decreased functional mobility secondary to encephalopathy due to urosepsis/multi-medical   Cont CIR  Notes reviewed - encephalopathy due to urosepsis, labs reviewed 2. DVT Prophylaxis/Anticoagulation: Eliquis. Monitor for any bleeding episodes  3. Pain Management: Lidoderm patch   avoid narcotics due to sedation and confusion  schedule tylenol 650mg  qid   heat/ice prn   consider low dose tramadol for severe pain if needed 4. Mood: Aricept 10 mg nightly, Cymbalta 30 mg nightly  5. Neuropsych: This patient is not capable of making decisions on his own behalf.  6. Skin/Wound Care: Stage I sacral pressure injury. Routine skin checks  7. Fluids/Electrolytes/Nutrition: Routine in and outs  Encourage PO 8. ID. IV Rocephin completed 12/29/2017.  9. V. tach cardiac arrest with history of PAF/pacemaker. Patient currently off Tikosyn until follow-up outpatient with cardiology services.  10. Chronic systolic congestive heart failure. Monitor for any signs of fluid overload.   Lasix 40 mg   Filed Weights   12/31/17 0500 01/01/18 0435 01/02/18 0600  Weight: 92.9 kg (204 lb 12.9 oz) 91.8 kg (202 lb 6.1 oz) 93.7 kg (206 lb 9.1 oz)    ? Reliability 11. Psoriatic arthritis. Methotrexate and chronic prednisone currently on hold.    Plan to resume 12.  Hypertension. Toprol-XL 25 mg daily. Monitor with increased mobility  13. Hyperlipidemia. Lipitor  14. CKD stage III.   Creatinine 1.39 on 7/15 15. History of prostate cancer status post radiation. Patient on Flomax prior to admission 0.4 mg daily. Renal ultrasound no hydronephrosis. Follow-up outpatient Dr. Amalia Hailey   Condom catheter at nights for continence and urinary frequency which has affected sleep  16. Acute on chronic anemia. Continue iron supplement.   Hemoglobin 10.0 on 7/15  Continue to monitor 17. Hypoalbuminemia  Supplement initiated 18. Leukocytosis  WBCs 13.5 on 7/15  Afebrile  UA/Ucx ordered  Continue to monitor   LOS (Days) 3 A FACE TO FACE EVALUATION WAS PERFORMED  Rael Yo Lorie Phenix, MD 01/02/2018 8:52 AM

## 2018-01-02 NOTE — Progress Notes (Signed)
Wife expresses concerns that he had chills this weekend. In the past, he has had chills prior to getting septic. She is concerned that he has recurrent infection. Foley discontinued today. Will order UA/UCS due to upward trend in WBC and chills over the weekend

## 2018-01-02 NOTE — Progress Notes (Signed)
Occupational Therapy Session Note  Patient Details  Name: Christopher Baker MRN: [id number] Date of Birth: 1934/04/17  Today's Date: 01/02/2018 OT Individual Time: 1005-1105 OT Individual Time Calculation (min): 60 min    Short Term Goals: Week 1:  OT Short Term Goal 1 (Week 1): Pt will sit to stand at sink or RW with MOD A OT Short Term Goal 2 (Week 1): Pt will transfer to [facility] with MAX A of 1 OT Short Term Goal 3 (Week 1): Pt will complete functional activity for 5 min wiht no more than 1 rest break to demonstrate improved activity tolerance OT Short Term Goal 4 (Week 1): Pt will don shirt wiht supervision  Skilled Therapeutic Interventions/Progress Updates:    Pt completed bathing and dressing during session.  He needed min assist for supine to sit EOB.  Increased posterior pelvic tilt and cervical flexion in sitting.  He was not able to state month, or day of the week.  He was able to complete UB bathing and dressing with supervision.   He requested the need to toilet during session so utilized [facility] for transfer to the 3:1 over the toilet.  He needed min assist for multiple sit to stand transitions using the [facility] with decreased endurance noted with each standing interval.  He needed mod assist for clothing management and toilet hygiene sit to stand as well.  Finished session with transfer back to the EOB using the [facility] and pt working on shaving with his Copy.  Mod instructional cueing to keep his eyes open as he would close them frequently.  Min assist for thoroughness with shaving.  Pt transferred back to supine to conclude session with min assist.  Call button and phone in reach and safety bed alarm in place.    Therapy Documentation Precautions:  Precautions Precautions: Fall Precaution Comments: chest and ribs sore from CPR Restrictions Weight Bearing Restrictions: No Other Position/Activity Restrictions: self R UE limiting due to R flank pain and solid bump/mass at bottom  of rib cage  Pain: Pain Assessment Pain Scale: Faces Faces Pain Scale: Hurts a little bit Pain Type: Chronic pain Pain Location: Back Pain Orientation: Lower Pain Descriptors / Indicators: Discomfort Pain Onset: On-going Pain Intervention(s): Repositioned ADL: See Function Navigator for Current Functional Status.   Therapy/Group: Individual Therapy  Tobi Groesbeck OTR/L 01/02/2018, 12:31 PM

## 2018-01-03 ENCOUNTER — Other Ambulatory Visit: Payer: Self-pay

## 2018-01-03 ENCOUNTER — Inpatient Hospital Stay (HOSPITAL_COMMUNITY): Payer: Self-pay | Admitting: Occupational Therapy

## 2018-01-03 ENCOUNTER — Inpatient Hospital Stay (HOSPITAL_COMMUNITY): Payer: Self-pay | Admitting: Speech Pathology

## 2018-01-03 ENCOUNTER — Inpatient Hospital Stay (HOSPITAL_COMMUNITY): Payer: PPO | Admitting: Occupational Therapy

## 2018-01-03 ENCOUNTER — Inpatient Hospital Stay (HOSPITAL_COMMUNITY): Payer: Self-pay

## 2018-01-03 ENCOUNTER — Inpatient Hospital Stay (HOSPITAL_COMMUNITY): Payer: Self-pay | Admitting: Physical Therapy

## 2018-01-03 DIAGNOSIS — D638 Anemia in other chronic diseases classified elsewhere: Secondary | ICD-10-CM

## 2018-01-03 LAB — URINE CULTURE: Culture: NO GROWTH

## 2018-01-03 MED ORDER — MUSCLE RUB 10-15 % EX CREA
1.0000 "application " | TOPICAL_CREAM | CUTANEOUS | Status: DC | PRN
  Administered 2018-01-03 – 2018-01-18 (×8): 1 via TOPICAL
  Filled 2018-01-03: qty 85

## 2018-01-03 NOTE — Progress Notes (Signed)
Occupational Therapy Session Note  Patient Details  Name: BUNNIE LEDERMAN MRN: [id number] Date of Birth: [birth year]  Today's Date: 01/03/2018 OT Individual Time: 1330-1400 OT Individual Time Calculation (min): 30 min (make up time)   Short Term Goals: Week 1:  OT Short Term Goal 1 (Week 1): Pt will sit to stand at sink or RW with MOD A OT Short Term Goal 2 (Week 1): Pt will transfer to [facility] with MAX A of 1 OT Short Term Goal 3 (Week 1): Pt will complete functional activity for 5 min wiht no more than 1 rest break to demonstrate improved activity tolerance OT Short Term Goal 4 (Week 1): Pt will don shirt wiht supervision  Skilled Therapeutic Interventions/Progress Updates:    Upon entering the room, pt supine in bed sleeping soundly and therapist unable to awaken pt. Caregiver, his wife, present in room and reports, " He has been sleeping like this for awhile." OT provided education to caregiver in regarding to pt's progress, goals, and OT purpose. She asked appropriate questions regarding pt care. Caregiver is also concerned regarding pt's current schedule and missed time. OT discussed possibility of 15/7 if she and team felt it was the best option to increase participation. Caregiver with no further questions at this time.   Therapy Documentation Precautions:  Precautions Precautions: Fall Precaution Comments: chest and ribs sore from CPR Restrictions Weight Bearing Restrictions: No Other Position/Activity Restrictions: self R UE limiting due to R flank pain and solid bump/mass at bottom of rib cage General:   Vital Signs: Therapy Vitals Temp: 97.8 F (36.6 C) Temp Source: Oral Pulse Rate: 81 Resp: 16 BP: 122/81 Patient Position (if appropriate): Lying Oxygen Therapy SpO2: 91 % O2 Device: Room Air Pain: Pain Assessment Pain Score: 0-No pain  See Function Navigator for Current Functional Status.   Therapy/Group: Individual Therapy  Gypsy Decant 01/03/2018, 4:09  PM

## 2018-01-03 NOTE — Progress Notes (Signed)
Occupational Therapy Session Note  Patient Details  Name: Christopher Baker MRN: [id number] Date of Birth: 1934-04-30  Today's Date: 01/03/2018 OT Individual Time: 1000-1105 OT Individual Time Calculation (min): 65 min    Short Term Goals: Week 1:  OT Short Term Goal 1 (Week 1): Pt will sit to stand at sink or RW with MOD A OT Short Term Goal 2 (Week 1): Pt will transfer to [facility] with MAX A of 1 OT Short Term Goal 3 (Week 1): Pt will complete functional activity for 5 min wiht no more than 1 rest break to demonstrate improved activity tolerance OT Short Term Goal 4 (Week 1): Pt will don shirt wiht supervision  Skilled Therapeutic Interventions/Progress Updates:    Pt received supine in bed with son present, agreeable to therapy with c/o pain as described below. Session focused on attention to task/arousal, ADL transfers, and dynamic sitting balance/reaching. Pt completed stand pivot transfer bed <> w/c with mod A. Min vc provided for UE placement/sequencing during transfer. Pt completed 25 ft of w/c mobility with heavy vc for technique and encouragement, before c/o fatigue. Pt was transported down to 1st floor to visit with son and change environment for pain distraction. Pt was much more alert and required fewer vc for arousal when in busy atrium. Pt was transported back to therapy gym and transferred to mat with mod A. Pt completed functional overhead reaching from sitting (unsupported) to facilitate shifting into anterior pelvic tilt and bring spine into extension. Pt fatigued quickly with this activity and requested to return to room to use bathroom. Prior to transfer to [facility] over toilet pt experienced bowel incontinence in brief. Pt performed 3x sit to stand to perform posterior peri hygiene with mod standing balance support. Pt was returned to bed and performed bed mobility to finish with peri-cleansing. Pt was left with SLP.   Therapy Documentation Precautions:  Precautions Precautions:  Fall Precaution Comments: chest and ribs sore from CPR Restrictions Weight Bearing Restrictions: No Other Position/Activity Restrictions: self R UE limiting due to R flank pain and solid bump/mass at bottom of rib cage   Vital Signs: Therapy Vitals BP: (!) 155/61 Patient Position (if appropriate): Sitting Pain: Pain Assessment Pain Scale: 0-10 Pain Score: 6  Faces Pain Scale: Hurts little more Pain Type: Chronic pain Pain Location: Back Pain Orientation: Lower Pain Descriptors / Indicators: Aching Pain Onset: On-going Pain Intervention(s): Environmental changes;Distraction;Emotional support  See Function Navigator for Current Functional Status.   Therapy/Group: Individual Therapy  Curtis Sites 01/03/2018, 12:00 PM

## 2018-01-03 NOTE — Progress Notes (Signed)
Speech Language Pathology Daily Session Note  Patient Details  Name: Christopher Baker MRN: [id number] Date of Birth: 27-[name]-1934  Today's Date: 01/03/2018 SLP Individual Time: 1100-1140 SLP Individual Time Calculation (min): 40 min  Short Term Goals: Week 1: SLP Short Term Goal 1 (Week 1): Pt will use external aids to facilitate recall of daily information with min assist.   SLP Short Term Goal 2 (Week 1): Pt will use environmental cues to facilitate orientation to date and situation with min assist.  SLP Short Term Goal 3 (Week 1): Pt will complete mildly complex tasks with min assist for functional problem solving.   SLP Short Term Goal 4 (Week 1): Pt will sustain his attention to functional tasks for 15 min intervals with min cues for redirection.    Skilled Therapeutic Interventions:  Pt was received from OT therapy session, in bed getting cleaned up from episode of bowel incontinence.  Once clean brief was donned, pt declined getting back out of bed.  Pt reports back pain but was unable to rate due to mentation.  Pt could tolerate sitting at ~45 degrees in bed as long has height of head of bed was adjusted gradually and with intermittent rest breaks to allow pain to subside.  SLP facilitated the session with basic card game to address attention to task and basic problem solving.  Pt needed max assist cues for task sequencing and could only sustain his attention to task for ~2-3 minute intervals before needing cues for redirection.  Pt needed frequent cues to keep his eyes open.  As a result, session was ended early due to pt fatigue.  Pt was left in bed with call bell within reach.    Function:  Eating Eating                 Cognition Comprehension Comprehension assist level: Understands basic 75 - 89% of the time/ requires cueing 10 - 24% of the time  Expression   Expression assist level: Expresses basic 75 - 89% of the time/requires cueing 10 - 24% of the time. Needs helper to  occlude trach/needs to repeat words.  Social Interaction Social Interaction assist level: Interacts appropriately 25 - 49% of time - Needs frequent redirection.  Problem Solving Problem solving assist level: Solves basic 25 - 49% of the time - needs direction more than half the time to initiate, plan or complete simple activities  Memory Memory assist level: Recognizes or recalls 25 - 49% of the time/requires cueing 50 - 75% of the time    Pain Pain Assessment Pain Scale: Faces Faces Pain Scale: Hurts little more Pain Type: Chronic pain Pain Location: Back Pain Orientation: Lower Pain Descriptors / Indicators: Aching Pain Intervention(s): Repositioned  Therapy/Group: Individual Therapy  Thara Searing, Selinda Orion 01/03/2018, 11:48 AM

## 2018-01-03 NOTE — Progress Notes (Signed)
Occupational Therapy Session Note  Patient Details  Name: Christopher Baker MRN: [id number] Date of Birth: 06-06-1934  Today's Date: 01/03/2018 OT Individual Time: 6378-5885 OT Individual Time Calculation (min): 38 min    Short Term Goals: Week 1:  OT Short Term Goal 1 (Week 1): Pt will sit to stand at sink or RW with MOD A OT Short Term Goal 2 (Week 1): Pt will transfer to [facility] with MAX A of 1 OT Short Term Goal 3 (Week 1): Pt will complete functional activity for 5 min wiht no more than 1 rest break to demonstrate improved activity tolerance OT Short Term Goal 4 (Week 1): Pt will don shirt wiht supervision  Skilled Therapeutic Interventions/Progress Updates:    Pt asleep to start session but woke up easily to verbal and tactile stimuli.  Worked on changing brief to start session with nursing assist.  Min assist for pt to roll side the side while nursing place brief.  Mod assist for supine to sit EOB on the left side.  Pt exhibiting increased trunk and neck flexion in sitting on the EOB, requiring mod instructional cueing to maintain upright posture along with min facilitation.  Completed sit to stand from the elevated EOB with max assist.  Pt unable to demonstrate full lumbar, hip, or knee extension during attempt and maintained partial squat position.  On the second attempt he was able to stand more upright in the trunk with UE support on therapist, but still maintain hip and knee flexion.  Total assist for turning to the bedside recliner as pt could not advance his LEs.  Pt left in recliner with safety alarm belt in place and call button in reach.  Pt's spouse also present.  Issued games and word searches for spouse to help stimulate sustained attention.    Therapy Documentation Precautions:  Precautions Precautions: Fall Precaution Comments: chest and ribs sore from CPR Restrictions Weight Bearing Restrictions: No Other Position/Activity Restrictions:     Pain: Pain Assessment Pain  Scale: Faces Pain Score: 0-No pain Faces Pain Scale: Hurts little more Pain Type: Chronic pain Pain Location: Back Pain Orientation: Lower Pain Descriptors / Indicators: Aching Pain Onset: On-going Pain Intervention(s): Repositioned Multiple Pain Sites: No ADL: See Function Navigator for Current Functional Status.   Therapy/Group: Individual Therapy  Khori Underberg OTR/L 01/03/2018, 4:37 PM

## 2018-01-03 NOTE — Progress Notes (Signed)
[facility] PHYSICAL MEDICINE & REHABILITATION     PROGRESS NOTE    Subjective/Complaints: Patient seen sitting up in bed this morning. He states he slept well overnight. Wife with numerous questions, addressed yesterday.  ROS: denies cardiac CP, SOB, nausea, vomiting, diarrhea.  Objective:  No results found. Recent Labs    01/02/18 0759  WBC 13.5*  HGB 10.0*  HCT 33.3*  PLT 433*   Recent Labs    01/02/18 0759  NA 144  K 3.9  CL 108  GLUCOSE 122*  BUN 30*  CREATININE 1.39*  CALCIUM 9.6   CBG (last 3)  No results for input(s): GLUCAP in the last 72 hours.  Wt Readings from Last 3 Encounters:  01/03/18 94 kg (207 lb 3.7 oz)  12/30/17 94.5 kg (208 lb 5.4 oz)  12/09/17 98.9 kg (218 lb)     Intake/Output Summary (Last 24 hours) at 01/03/2018 0847 Last data filed at 01/02/2018 1751 Gross per 24 hour  Intake 355 ml  Output 425 ml  Net -70 ml    Vital Signs: Blood pressure (!) 157/51, pulse 60, temperature 98.3 F (36.8 C), temperature source Oral, resp. rate 18, height 6\' 3"  (1.905 m), weight 94 kg (207 lb 3.7 oz), SpO2 100 %. Physical Exam:  Constitutional: No distress . Vital signs reviewed. HENT: Normocephalic.  Atraumatic. Eyes: EOMI. No discharge. Cardiovascular: RRR. No JVD. Respiratory: CTA bilaterally. Normal effort. GI: BS +. Non-distended. Musc: No edema or tenderness in extremities. Neurological:  Alert and oriented 2 Follows simple commands.  Motor: B/l UE 4-/5 prox to distal. (unchanged) B/l LE: 3+/5 proximal to distal. Psychiatric: Pleasant and cooperative but confused and slowed  Assessment/Plan: 1. Functional deficits secondary to urosepsis and subsequent encephalopathy and debility which require 3+ hours per day of interdisciplinary therapy in a comprehensive inpatient rehab setting. Physiatrist is providing close team supervision and 24 hour management of active medical problems listed below. Physiatrist and rehab team continue to  assess barriers to discharge/monitor patient progress toward functional and medical goals.  Function:  Bathing Bathing position   Position: Bed  Bathing parts Body parts bathed by patient: Right arm, Left arm, Chest, Abdomen Body parts bathed by helper: Buttocks, Right lower leg, Left lower leg, Back  Bathing assist Assist Level: Touching or steadying assistance(Pt > 75%)      Upper Body Dressing/Undressing Upper body dressing   What is the patient wearing?: Pull over shirt/dress     Pull over shirt/dress - Perfomed by patient: Thread/unthread right sleeve, Thread/unthread left sleeve, Put head through opening Pull over shirt/dress - Perfomed by helper: Pull shirt over trunk        Upper body assist Assist Level: Supervision or verbal cues      Lower Body Dressing/Undressing Lower body dressing   What is the patient wearing?: Pants, Ted Hose, Non-skid slipper socks     Pants- Performed by patient: (pt dependent in all LB dressing per OT note) Pants- Performed by helper: Thread/unthread right pants leg, Thread/unthread left pants leg, Pull pants up/down(pt dependent in all LB dressing per OT note)   Non-skid slipper socks- Performed by helper: Don/doff right sock, Don/doff left sock             TED Hose - Performed by patient: (pt dependent in all LB dressing per OT note) TED Hose - Performed by helper: Don/doff right TED hose, Don/doff left TED hose(pt dependent in all LB dressing per OT note)  Lower body assist Assist for lower body  dressing: (total)      Toileting Toileting Toileting activity did not occur: No continent bowel/bladder event   Toileting steps completed by helper: Adjust clothing prior to toileting, Performs perineal hygiene, Adjust clothing after toileting    Toileting assist Assist level: Two helpers(per Hope B, NT report)   Transfers Chair/bed transfer   Chair/bed transfer method: Other Chair/bed transfer assist level: 2 helpers(per Hope B, NT  report) Chair/bed transfer assistive device: Mechanical lift Mechanical lift: Ecologist     Max distance: 2'(able to take a few steps) Assist level: Maximal assist (Pt 25 - 49%)   Wheelchair   Type: Manual Max wheelchair distance: 15' Assist Level: Touching or steadying assistance (Pt > 75%)  Cognition Comprehension Comprehension assist level: Follows basic conversation/direction with extra time/assistive device  Expression Expression assist level: Expresses basic needs/ideas: With extra time/assistive device  Social Interaction Social Interaction assist level: Interacts appropriately 50 - 74% of the time - May be physically or verbally inappropriate.  Problem Solving Problem solving assist level: Solves basic 50 - 74% of the time/requires cueing 25 - 49% of the time  Memory Memory assist level: Recognizes or recalls 50 - 74% of the time/requires cueing 25 - 49% of the time   Medical Problem List and Plan:  1. Decreased functional mobility secondary to encephalopathy due to urosepsis/multi-medical   Cont CIR 2. DVT Prophylaxis/Anticoagulation: Eliquis. Monitor for any bleeding episodes  3. Pain Management: Lidoderm patch   avoid narcotics due to sedation and confusion  schedule tylenol 650mg  qid   heat/ice prn   consider low dose tramadol for severe pain if needed 4. Mood: Aricept 10 mg nightly, Cymbalta 30 mg nightly  5. Neuropsych: This patient is not capable of making decisions on his own behalf.  6. Skin/Wound Care: Stage I sacral pressure injury. Routine skin checks  7. Fluids/Electrolytes/Nutrition: Routine in and outs  Encourage PO 8. ID. IV Rocephin completed 12/29/2017.  9. V. tach cardiac arrest with history of PAF/pacemaker. Patient currently off Tikosyn until follow-up outpatient with cardiology services.  10. Chronic systolic congestive heart failure. Monitor for any signs of fluid overload.   Lasix 40 mg   Filed Weights   01/01/18 0435 01/02/18  0600 01/03/18 0444  Weight: 91.8 kg (202 lb 6.1 oz) 93.7 kg (206 lb 9.1 oz) 94 kg (207 lb 3.7 oz)    ? Trending up, will consider adjustments to diuretics tomorrow if continues to trend up 11. Psoriatic arthritis. Methotrexate and chronic prednisone currently on hold.    Plan to resume after workup for UTI 12. Hypertension. Toprol-XL 25 mg daily. Monitor with increased mobility   ? Trending up, will consider adjustment of medications if persistently elevated 13. Hyperlipidemia. Lipitor  14. CKD stage III.   Creatinine 1.39 on 7/15 15. History of prostate cancer status post radiation. Patient on Flomax prior to admission 0.4 mg daily. Renal ultrasound no hydronephrosis. Follow-up outpatient Dr. Philis Pique [place] on 7/15 16. Acute on chronic anemia. Continue iron supplement.   Hemoglobin 10.0 on 7/15  Labs ordered for tomorrow  Continue to monitor 17. Hypoalbuminemia  Supplement initiated 18. Leukocytosis  WBCs 13.5 on 7/15  Afebrile  UA -/equivocal, urine culture pending  Continue to monitor  Greater than 35 minutes spent with patient and patient's wife with greater than 30 minutes in counseling over the last 24 hours regarding vitamin B12, dizziness, pressure sore, short-term memory loss, chills, etc.   LOS (Days) 4 A FACE TO  FACE EVALUATION WAS PERFORMED  Ankit Lorie Phenix, MD 01/03/2018 8:47 AM

## 2018-01-03 NOTE — Plan of Care (Signed)
  Problem: RH SAFETY Goal: RH STG ADHERE TO SAFETY PRECAUTIONS W/ASSISTANCE/DEVICE Description STG Adhere to Safety Precautions With min Assistance/Device.  Outcome: Progressing  Call light within reach, bed alarm, proper footwear

## 2018-01-03 NOTE — Progress Notes (Signed)
Physical Therapy Session Note  Patient Details  Name: Christopher Baker MRN: [id number] Date of Birth: 1934-03-26  Today's Date: 01/03/2018 PT Individual Time: 0800-0840 PT Individual Time Calculation (min): 40 min   Short Term Goals: Week 1:  PT Short Term Goal 1 (Week 1): Pt will perform squat pivot transfer bed to/from w/c with max A PT Short Term Goal 2 (Week 1): Pt will ambulate x 10 ft with LRAD and max A PT Short Term Goal 3 (Week 1): Pt will propel manual w/c x 50 ft with min A  Skilled Therapeutic Interventions/Progress Updates:   Pt in supine and agreeable to therapy, reports pain and discomfort in back, does not rate. Session focused on functional mobility and tolerance to upright. Transferred to EOB w/ min assist. Maintained static sitting for 5+ min w/ supervision and verbal cues for safety. Increased dizziness in upright sitting, vitals WNL. Transferred to recliner via stedy, min assist to boost into standing from raised surface. Pt reports improved dizziness in recliner, but some still remains. Offered water and yogurt as pt had not eaten breakfast (stated it didn't taste good). Dizziness improved after eating snack, but some remained, made RN aware. Discussed strategies to help w/ dizziness including deep breathing, resting head back on recliner, and propping feet up. Pt agreeable to stay up in recliner until next session to work on tolerance to upright. Pt resting comfortably, states dizziness was resolved. Ended session in recliner, call bell within reach and all needs met. Quick release belt alarm engaged.   Therapy Documentation Precautions:  Precautions Precautions: Fall Precaution Comments: chest and ribs sore from CPR Restrictions Weight Bearing Restrictions: No Other Position/Activity Restrictions: self R UE limiting due to R flank pain and solid bump/mass at bottom of rib cage Vital Signs: Therapy Vitals Temp: 98.3 F (36.8 C) Temp Source: Oral Pulse Rate:  60 Resp: 18 BP: (!) 157/51 Patient Position (if appropriate): Lying Oxygen Therapy SpO2: 100 % O2 Device: Room Air  See Function Navigator for Current Functional Status.   Therapy/Group: Individual Therapy  Ami Mally K Arnette 01/03/2018, 8:43 AM

## 2018-01-03 NOTE — Progress Notes (Signed)
Patient on home CPAP unit. 

## 2018-01-04 ENCOUNTER — Inpatient Hospital Stay (HOSPITAL_COMMUNITY): Payer: Self-pay | Admitting: Physical Therapy

## 2018-01-04 ENCOUNTER — Inpatient Hospital Stay (HOSPITAL_COMMUNITY): Payer: Self-pay | Admitting: Occupational Therapy

## 2018-01-04 ENCOUNTER — Inpatient Hospital Stay (HOSPITAL_COMMUNITY): Payer: Self-pay | Admitting: Speech Pathology

## 2018-01-04 LAB — CBC WITH DIFFERENTIAL/PLATELET
Abs Immature Granulocytes: 0.2 10*3/uL — ABNORMAL HIGH (ref 0.0–0.1)
Basophils Absolute: 0.1 10*3/uL (ref 0.0–0.1)
Basophils Relative: 1 %
EOS ABS: 0.1 10*3/uL (ref 0.0–0.7)
Eosinophils Relative: 1 %
HEMATOCRIT: 31.8 % — AB (ref 39.0–52.0)
HEMOGLOBIN: 9.5 g/dL — AB (ref 13.0–17.0)
Immature Granulocytes: 2 %
LYMPHS ABS: 0.8 10*3/uL (ref 0.7–4.0)
LYMPHS PCT: 6 %
MCH: 27.1 pg (ref 26.0–34.0)
MCHC: 29.9 g/dL — AB (ref 30.0–36.0)
MCV: 90.9 fL (ref 78.0–100.0)
MONO ABS: 1.3 10*3/uL — AB (ref 0.1–1.0)
MONOS PCT: 11 %
Neutro Abs: 9.6 10*3/uL — ABNORMAL HIGH (ref 1.7–7.7)
Neutrophils Relative %: 79 %
Platelets: 451 10*3/uL — ABNORMAL HIGH (ref 150–400)
RBC: 3.5 MIL/uL — ABNORMAL LOW (ref 4.22–5.81)
RDW: 16.2 % — ABNORMAL HIGH (ref 11.5–15.5)
WBC: 12 10*3/uL — ABNORMAL HIGH (ref 4.0–10.5)

## 2018-01-04 LAB — BASIC METABOLIC PANEL
Anion gap: 9 (ref 5–15)
BUN: 28 mg/dL — ABNORMAL HIGH (ref 8–23)
CHLORIDE: 106 mmol/L (ref 98–111)
CO2: 26 mmol/L (ref 22–32)
CREATININE: 1.53 mg/dL — AB (ref 0.61–1.24)
Calcium: 9.6 mg/dL (ref 8.9–10.3)
GFR calc Af Amer: 47 mL/min — ABNORMAL LOW (ref >60)
GFR calc non Af Amer: 41 mL/min — ABNORMAL LOW (ref >60)
GLUCOSE: 116 mg/dL — AB (ref 70–99)
POTASSIUM: 4.1 mmol/L (ref 3.5–5.1)
Sodium: 141 mmol/L (ref 135–145)

## 2018-01-04 MED ORDER — PREDNISONE 5 MG PO TABS
5.0000 mg | ORAL_TABLET | Freq: Every day | ORAL | Status: DC
  Administered 2018-01-04 – 2018-01-18 (×15): 5 mg via ORAL
  Filled 2018-01-04 (×15): qty 1

## 2018-01-04 MED ORDER — METHOTREXATE 2.5 MG PO TABS
15.0000 mg | ORAL_TABLET | ORAL | Status: DC
  Administered 2018-01-09 – 2018-01-16 (×2): 15 mg via ORAL
  Filled 2018-01-04 (×2): qty 6

## 2018-01-04 NOTE — Progress Notes (Signed)
Spoke with Algis Liming, PA-C concerning wife's questions about adding narcotic pain medication. Pam states that at this time she will not add this to his medication list but will forward the message to Dr. Naaman Plummer and let him decide. Will notify the wife of decision at this time.

## 2018-01-04 NOTE — Progress Notes (Signed)
RT NOTE:  Pt refuses CPAP tonight. Pt will let RT know if he changes his mind.

## 2018-01-04 NOTE — Progress Notes (Signed)
[place] PHYSICAL MEDICINE & REHABILITATION     PROGRESS NOTE    Subjective/Complaints: Patient lying in bed.  Slow to arouse this morning.  Apparently slept well last night.  ROS: Patient denies fever, rash, sore throat, blurred vision, nausea, vomiting, diarrhea, cough, shortness of breath or chest pain, joint or back pain, headache, or mood change. .  Objective:  No results found. Recent Labs    01/02/18 0759 01/04/18 0635  WBC 13.5* 12.0*  HGB 10.0* 9.5*  HCT 33.3* 31.8*  PLT 433* 451*   Recent Labs    01/02/18 0759 01/04/18 0635  NA 144 141  K 3.9 4.1  CL 108 106  GLUCOSE 122* 116*  BUN 30* 28*  CREATININE 1.39* 1.53*  CALCIUM 9.6 9.6   CBG (last 3)  No results for input(s): GLUCAP in the last 72 hours.  Wt Readings from Last 3 Encounters:  01/04/18 94.1 kg (207 lb 7.3 oz)  12/30/17 94.5 kg (208 lb 5.4 oz)  12/09/17 98.9 kg (218 lb)     Intake/Output Summary (Last 24 hours) at 01/04/2018 1107 Last data filed at 01/03/2018 1817 Gross per 24 hour  Intake 540 ml  Output -  Net 540 ml    Vital Signs: Blood pressure (!) 139/41, pulse 74, temperature 98 F (36.7 C), temperature source Oral, resp. rate 18, height 6\' 3"  (1.905 m), weight 94.1 kg (207 lb 7.3 oz), SpO2 98 %. Physical Exam:  Constitutional: No distress . Vital signs reviewed. HEENT: EOMI, oral membranes moist Neck: supple Cardiovascular: RRR without murmur. No JVD    Respiratory: CTA Bilaterally without wheezes or rales. Normal effort    GI: BS +, non-tender, non-distended  Musc: No edema or tenderness in extremities. Neurological:  Alert and oriented 2 only, slow to arouse Follows simple commands.  Motor: B/l UE 4-/5 prox to distal. (Stable) B/l LE: 3+/5 proximal to distal.(Stable) Psychiatric: Remains pleasantly confused  Assessment/Plan: 1. Functional deficits secondary to urosepsis and subsequent encephalopathy and debility which require 3+ hours per day of interdisciplinary  therapy in a comprehensive inpatient rehab setting. Physiatrist is providing close team supervision and 24 hour management of active medical problems listed below. Physiatrist and rehab team continue to assess barriers to discharge/monitor patient progress toward functional and medical goals.  Function:  Bathing Bathing position   Position: Bed  Bathing parts Body parts bathed by patient: Right arm, Left arm, Chest, Abdomen Body parts bathed by helper: Buttocks, Right lower leg, Left lower leg, Back  Bathing assist Assist Level: Touching or steadying assistance(Pt > 75%)      Upper Body Dressing/Undressing Upper body dressing   What is the patient wearing?: Pull over shirt/dress     Pull over shirt/dress - Perfomed by patient: Thread/unthread right sleeve, Thread/unthread left sleeve, Put head through opening Pull over shirt/dress - Perfomed by helper: Pull shirt over trunk        Upper body assist Assist Level: Supervision or verbal cues      Lower Body Dressing/Undressing Lower body dressing   What is the patient wearing?: Pants, Ted Hose, Non-skid slipper socks     Pants- Performed by patient: (pt dependent in all LB dressing per OT note) Pants- Performed by helper: Thread/unthread right pants leg, Thread/unthread left pants leg, Pull pants up/down(pt dependent in all LB dressing per OT note)   Non-skid slipper socks- Performed by helper: Don/doff right sock, Don/doff left sock             TED Hose -  Performed by patient: (pt dependent in all LB dressing per OT note) TED Hose - Performed by helper: Don/doff right TED hose, Don/doff left TED hose(pt dependent in all LB dressing per OT note)  Lower body assist Assist for lower body dressing: (total)      Toileting Toileting Toileting activity did not occur: No continent bowel/bladder event Toileting steps completed by patient: Adjust clothing prior to toileting, Performs perineal hygiene Toileting steps completed by  helper: Adjust clothing after toileting    Toileting assist Assist level: (mod A)   Transfers Chair/bed transfer   Chair/bed transfer method: Stand pivot Chair/bed transfer assist level: Total assist (Pt < 25%) Chair/bed transfer assistive device: Mechanical lift Mechanical lift: Ecologist     Max distance: 2'(able to take a few steps) Assist level: Maximal assist (Pt 25 - 49%)   Wheelchair   Type: Manual Max wheelchair distance: 15' Assist Level: Touching or steadying assistance (Pt > 75%)  Cognition Comprehension Comprehension assist level: Follows basic conversation/direction with extra time/assistive device  Expression Expression assist level: Expresses basic needs/ideas: With extra time/assistive device  Social Interaction Social Interaction assist level: Interacts appropriately 50 - 74% of the time - May be physically or verbally inappropriate.  Problem Solving Problem solving assist level: Solves basic 50 - 74% of the time/requires cueing 25 - 49% of the time  Memory Memory assist level: Recognizes or recalls 50 - 74% of the time/requires cueing 25 - 49% of the time   Medical Problem List and Plan:  1. Decreased functional mobility secondary to encephalopathy due to urosepsis/multi-medical   Cont CIR, team conference today 2. DVT Prophylaxis/Anticoagulation: Eliquis. Monitor for any bleeding episodes  3. Pain Management: Lidoderm patch   avoiding narcotics due to sedation and confusion  schedule tylenol 650mg  qid   heat/ice prn   consider low dose tramadol for severe pain if needed 4. Mood: Aricept 10 mg nightly, Cymbalta 30 mg nightly  5. Neuropsych: This patient is not capable of making decisions on his own behalf.  6. Skin/Wound Care: Stage I sacral pressure injury. Routine skin checks  7. Fluids/Electrolytes/Nutrition: Routine in and outs  Encourage PO 8. ID. IV Rocephin completed 12/29/2017.  9. V. tach cardiac arrest with history of  PAF/pacemaker. Patient currently off Tikosyn until follow-up outpatient with cardiology services.  10. Chronic systolic congestive heart failure. Monitor for any signs of fluid overload.   Lasix 40 mg   Filed Weights   01/02/18 0600 01/03/18 0444 01/04/18 0449  Weight: 93.7 kg (206 lb 9.1 oz) 94 kg (207 lb 3.7 oz) 94.1 kg (207 lb 7.3 oz)    ?  Weight with recent trend up however appears fairly stable today.  Maintain current Lasix dosing 11. Psoriatic arthritis. Methotrexate and chronic prednisone currently on hold.    -We will resume medications: prednisone 5mg  daily and MTX 15mg  every Monday 12. Hypertension. Toprol-XL 25 mg daily. Monitor with increased mobility   BP is borderline to normal today 13. Hyperlipidemia. Lipitor  14. CKD stage III.   Creatinine 1.39 on 7/15 15. History of prostate cancer status post radiation. Patient on Flomax prior to admission 0.4 mg daily. Renal ultrasound no hydronephrosis. Follow-up outpatient Dr. Philis Pique Sherman on 7/15 16. Acute on chronic anemia. Continue iron supplement.   Hemoglobin 9.5 on 7/17  No outward signs of bleeding  Continue to monitor labs and clinical Eks[place] 17. Hypoalbuminemia  Supplement initiated 18. Leukocytosis  WBCs 13.5 on 7/15--- down to  12.0 on 7/17  Afebrile  UA -/equivocal, urine culture is negative  No active signs of infection    LOS (Days) 5 A FACE TO FACE EVALUATION WAS PERFORMED  [name], MD 01/04/2018 11:07 AM

## 2018-01-04 NOTE — Progress Notes (Signed)
Occupational Therapy Session Note  Patient Details  Name: Christopher Baker MRN: [id number] Date of Birth: November 20, 1933  Today's Date: 01/04/2018 OT Individual Time: 9179-1505 OT Individual Time Calculation (min): 59 min    Short Term Goals: Week 1:  OT Short Term Goal 1 (Week 1): Pt will sit to stand at sink or RW with MOD A OT Short Term Goal 2 (Week 1): Pt will transfer to [facility] with MAX A of 1 OT Short Term Goal 3 (Week 1): Pt will complete functional activity for 5 min wiht no more than 1 rest break to demonstrate improved activity tolerance OT Short Term Goal 4 (Week 1): Pt will don shirt wiht supervision  Skilled Therapeutic Interventions/Progress Updates:    Pt completed bathing and dressing sit to stand on the EOB.  Pt more alert this session with mod assist needed to transition from supine to sitting.  He completed UB bathing and dressing with supervision.  LB bathing and dressing with mod assist sit to stand, pulling up on the [place].  Max instructional cueing is still needed to maintain upright posture with head and trunk in sitting and standing, with increased fatigue noted as well.  Standing for 1-2 mins with dyspnea 3/4 and pt needing to rest.  Decreased ability to reach the feet for washing as well as for donning socks.  He was able to however donn the left sock after therapist assisted with TEDs but could not complete the right one.  Used Stedy for transfer to the bedside recliner with min assist for sit to stand.  Pt left in the recliner with call button and phone in reach and belt alarm in place as well.    Therapy Documentation Precautions:  Precautions Precautions: Fall Precaution Comments: chest and ribs sore from CPR Restrictions Weight Bearing Restrictions: No Other Position/Activity Restrictions:    Pain: Pain Assessment Pain Scale: Faces Pain Score: 9  Faces Pain Scale: Hurts a little bit Pain Type: Chronic pain Pain Location: Back Pain Orientation: Lower Pain  Descriptors / Indicators: Discomfort Pain Onset: With Activity Pain Intervention(s): Repositioned ADL: See Function Navigator for Current Functional Status.   Therapy/Group: Individual Therapy  Randell Detter OTR/L 01/04/2018, 12:42 PM

## 2018-01-04 NOTE — Progress Notes (Signed)
Pt A/O, no noted distress. Continues to c/o pain, administered muscle rub on back and administered scheduled tylenol. Pt sat up for breakfast and lunch, today.  Spent more one on one with pt, encouraging him sitting in chair a little more each day will help build up strength. Spouse feels, "if comes at a later time the staff will be able to get him to do." Spouse Number [phone]. Staff will continue to monitor and meet needs.

## 2018-01-04 NOTE — Progress Notes (Signed)
Occupational Therapy Session Note  Patient Details  Name: Christopher Baker MRN: [id number] Date of Birth: 1934/01/22  Today's Date: 01/04/2018 OT Individual Time: 8469-6295 OT Individual Time Calculation (min): 32 min    Short Term Goals: Week 1:  OT Short Term Goal 1 (Week 1): Pt will sit to stand at sink or RW with MOD A OT Short Term Goal 2 (Week 1): Pt will transfer to [facility] with MAX A of 1 OT Short Term Goal 3 (Week 1): Pt will complete functional activity for 5 min wiht no more than 1 rest break to demonstrate improved activity tolerance OT Short Term Goal 4 (Week 1): Pt will don shirt wiht supervision  Skilled Therapeutic Interventions/Progress Updates:    Pt in bedside recliner to start session.  He was able to wake up easily but demonstrated pain on his face with initial sitting up from reclined position.  He agreed to participate in therapy and expressed interest in completing oral hygiene before transferring back to bed.  Supervision for completion of task with therapist also presenting him his electric razor.  He was able to complete shaving with supervision from supported sitting position.  Frequent rest breaks needed with pt flexing his head often secondary to reported fatigue.  He completed stand pivot transfer to the bed from bedside recliner with max assist.  Pt relying heavily on therapist support using his arms on therapist's shoulders.  Decreased full hip and knee extension, however pt was able to take some small steps this attempt compared to yesterday.  Finished session with transition to supine with mod assist and pt left in bed with spouse present and call button in reach.  Discussed planned reduction and spacing of therapies with pt and spouse to help him participate and tolerate therapies better.    Therapy Documentation Precautions:  Precautions Precautions: Fall Precaution Comments: chest and ribs sore from CPR Restrictions Weight Bearing Restrictions: No Other  Position/Activity Restrictions:    Pain: Pain Assessment Pain Scale: Faces Pain Score: 8  Pain Type: Chronic pain Pain Location: Back Pain Orientation: Lower Pain Descriptors / Indicators: Discomfort Pain Onset: On-going Pain Intervention(s): Repositioned ADL:  See Function Navigator for Current Functional Status.   Therapy/Group: Individual Therapy  Jair Lindblad OTR/L 01/04/2018, 4:13 PM

## 2018-01-04 NOTE — Progress Notes (Signed)
Physical Therapy Session Note  Patient Details  Name: Christopher Baker MRN: [id number] Date of Birth: 07-Dec-1933  Today's Date: 01/04/2018 PT Individual Time: 0786-7544 PT Individual Time Calculation (min): 30 min   Short Term Goals: Week 1:  PT Short Term Goal 1 (Week 1): Pt will perform squat pivot transfer bed to/from w/c with max A PT Short Term Goal 2 (Week 1): Pt will ambulate x 10 ft with LRAD and max A PT Short Term Goal 3 (Week 1): Pt will propel manual w/c x 50 ft with min A  Skilled Therapeutic Interventions/Progress Updates:   Pt received supine in bed and agreeable to PT after encouragement from PT. Pt declined any OOB activity or attempt to sit EOB.  PT instructed pt in Supine therex SLR x 5 BLE, heel slides x 10 BLE, hip abduction x 8 BLE. Pt noted to fall asleep between exercise bouts and intermittently during exercise. Pt unable to continue to fatigue and inability to remain aroused durring treatment. PT position pt in modified supine with pillows under the R side to reduce pressure on sacrum. Call bell in reach and all  Needs met.      Therapy Documentation Precautions:  Precautions Precautions: Fall Precaution Comments: chest and ribs sore from CPR Restrictions Weight Bearing Restrictions: No Other Position/Activity Restrictions:      Vital Signs: Therapy Vitals Temp: 97.7 F (36.5 C) Temp Source: Oral Pulse Rate: (!) 51 Resp: 17 BP: (!) 147/55 Patient Position (if appropriate): Lying Oxygen Therapy SpO2: 99 % O2 Device: Room Air Pain: Pain Assessment Pain Score: 8    See Function Navigator for Current Functional Status.   Therapy/Group: Individual Therapy  Lorie Phenix 01/04/2018, 4:02 PM

## 2018-01-04 NOTE — Plan of Care (Signed)
  Problem: RH SKIN INTEGRITY Goal: RH STG ABLE TO PERFORM INCISION/WOUND CARE W/ASSISTANCE Description STG Able To Perform Incision/Wound Care With max Assistance.  Outcome: Progressing  Foam on sacrum  Problem: RH SAFETY Goal: RH STG ADHERE TO SAFETY PRECAUTIONS W/ASSISTANCE/DEVICE Description STG Adhere to Safety Precautions With min Assistance/Device.  Outcome: Progressing  Call light within reach, bed/chair alarm, proper footwear

## 2018-01-04 NOTE — Progress Notes (Signed)
Physical Therapy Session Note  Patient Details  Name: Christopher Baker MRN: [id number] Date of Birth: 03-12-1934  Today's Date: 01/04/2018 PT Individual Time: 1130-1200 PT Individual Time Calculation (min): 30 min   Short Term Goals: Week 1:  PT Short Term Goal 1 (Week 1): Pt will perform squat pivot transfer bed to/from w/c with max A PT Short Term Goal 2 (Week 1): Pt will ambulate x 10 ft with LRAD and max A PT Short Term Goal 3 (Week 1): Pt will propel manual w/c x 50 ft with min A  Skilled Therapeutic Interventions/Progress Updates:    Pt received seated in recliner in room, agreeable to PT. Pt reports 9/10 pain in back and R hip area, RN provides pain medication and muscle rub at beginning of therapy session. Attempt sit to stand, pt reports increase in R hip pain and requests to sit back down. Pt declines to participate in any further transfer attempts secondary to pain. Seated BLE therex x 10 reps, increase in R hip pain. Offered pt ice pack for R hip, pt accepts. Pt requests to call his wife, assist pt with dialing his wife on the phone. Pt left seated in recliner with quick release belt in place, needs in reach, ice pack to R hip and low back region.  Therapy Documentation Precautions:  Precautions Precautions: Fall Precaution Comments: chest and ribs sore from CPR Restrictions Weight Bearing Restrictions: No Other Position/Activity Restrictions:    See Function Navigator for Current Functional Status.   Therapy/Group: Individual Therapy  Excell Seltzer, PT, DPT  01/04/2018, 12:08 PM

## 2018-01-04 NOTE — Progress Notes (Signed)
Speech Language Pathology Daily Session Note  Patient Details  Name: Christopher Baker MRN: [id number] Date of Birth: 10/03/1933  Today's Date: 01/04/2018 SLP Individual Time: 6144-3154 SLP Individual Time Calculation (min): 45 min and Today's Date: 01/04/2018 SLP Missed Time: 15 Minutes Missed Time Reason: Patient fatigue;Pain  Short Term Goals: Week 1: SLP Short Term Goal 1 (Week 1): Pt will use external aids to facilitate recall of daily information with min assist.   SLP Short Term Goal 2 (Week 1): Pt will use environmental cues to facilitate orientation to date and situation with min assist.  SLP Short Term Goal 3 (Week 1): Pt will complete mildly complex tasks with min assist for functional problem solving.   SLP Short Term Goal 4 (Week 1): Pt will sustain his attention to functional tasks for 15 min intervals with min cues for redirection.    Skilled Therapeutic Interventions: Skilled treatment session focused on cognitive goals. Upon arrival, charge nurse present and talking to patient's wife who reported that the patient is in too much pain to function and recently told her, "I'm done, send me to a nursing home." Patient's wife visibly upset to see her husband in such pain. SLP provided emotional support. Patient was awake in recliner but appeared lethargic requesting to get back into bed. Patient was premedicated and repositioned in recliner. Due to fatigue and pain, patient required Max A verbal cues for sustained attention to basic tasks and conversation and was disoriented to time. With the assistance of the patient, SLP generated a set schedule/OOB schedule in order to facilitate arousal and maximize comfort. Patient left supine in recliner with alarm on. Continue with current plan of care.      Function:   Cognition Comprehension Comprehension assist level: Follows basic conversation/direction with extra time/assistive device  Expression   Expression assist level: Expresses basic  90% of the time/requires cueing < 10% of the time.  Social Interaction Social Interaction assist level: Interacts appropriately 75 - 89% of the time - Needs redirection for appropriate language or to initiate interaction.  Problem Solving Problem solving assist level: Solves basic 50 - 74% of the time/requires cueing 25 - 49% of the time  Memory Memory assist level: Recognizes or recalls 25 - 49% of the time/requires cueing 50 - 75% of the time    Pain Pain Assessment Pain Score: 8   Therapy/Group: Individual Therapy  Micky Sheller 01/04/2018, 3:44 PM

## 2018-01-05 ENCOUNTER — Inpatient Hospital Stay (HOSPITAL_COMMUNITY): Payer: Self-pay | Admitting: Physical Therapy

## 2018-01-05 ENCOUNTER — Inpatient Hospital Stay (HOSPITAL_COMMUNITY): Payer: Self-pay | Admitting: Speech Pathology

## 2018-01-05 ENCOUNTER — Ambulatory Visit: Payer: Self-pay | Admitting: Rehabilitation

## 2018-01-05 ENCOUNTER — Inpatient Hospital Stay (HOSPITAL_COMMUNITY): Payer: Self-pay | Admitting: Occupational Therapy

## 2018-01-05 MED ORDER — LIDOCAINE 5 % EX PTCH
1.0000 | MEDICATED_PATCH | CUTANEOUS | Status: DC
  Administered 2018-01-05 – 2018-01-18 (×14): 1 via TRANSDERMAL
  Filled 2018-01-05 (×17): qty 1

## 2018-01-05 NOTE — Progress Notes (Signed)
Physical Therapy Session Note  Patient Details  Name: Christopher Baker MRN: [id number] Date of Birth: 1933-10-05  Today's Date: 01/05/2018 PT Individual Time: 1100-1200 PT Individual Time Calculation (min): 60 min   Short Term Goals: Week 1:  PT Short Term Goal 1 (Week 1): Pt will perform squat pivot transfer bed to/from w/c with max A PT Short Term Goal 2 (Week 1): Pt will ambulate x 10 ft with LRAD and max A PT Short Term Goal 3 (Week 1): Pt will propel manual w/c x 50 ft with min A  Skilled Therapeutic Interventions/Progress Updates:   Pt received supine in bed and agreeable to PT. Supine>sit transfer with mod assist and min cues for safety. Stand pivot tranfers competed x 4 throughout treatment with mod assist overall and moderate cues for AD management and gait pattern. Sit<>stand transfer training completed x 5 throughout treatment with mod assist overall from PT.   Nustep reciprocal movement and endurance training x 8 min with supervision assist from PT with min cues for full ROM and proper speed.   Gait training x 90f with RW mod assist form PT. Moderate cues for step length and use of UE on RW. WC mobility x 1320fwith supervision assist from PT with min cues for doorway management   Patient returned to room and [facility] in reach and all needs met.         Therapy Documentation Precautions:  Precautions Precautions: Fall Precaution Comments: chest and ribs sore from CPR Restrictions Weight Bearing Restrictions: No Other Position/Activity Restrictions:   Pain: Pain Assessment Pain Scale: Faces Pain Score: 3  Faces Pain Scale: Hurts little more Pain Type: Chronic pain Pain Location: Back Pain Orientation: Lower Pain Descriptors / Indicators: Discomfort;Grimacing Pain Onset: With Activity Pain Intervention(s): Repositioned;Emotional support  See Function Navigator for Current Functional Status.   Therapy/Group: Individual Therapy  AuLorie Phenix/18/2019, 11:55 AM

## 2018-01-05 NOTE — Progress Notes (Signed)
Occupational Therapy Session Note  Patient Details  Name: Christopher Baker MRN: [id number] Date of Birth: 1934/02/23  Today's Date: 01/05/2018 OT Individual Time: 0800-0903 OT Individual Time Calculation (min): 63 min    Short Term Goals: Week 1:  OT Short Term Goal 1 (Week 1): Pt will sit to stand at sink or RW with MOD A OT Short Term Goal 2 (Week 1): Pt will transfer to [facility] with MAX A of 1 OT Short Term Goal 3 (Week 1): Pt will complete functional activity for 5 min wiht no more than 1 rest break to demonstrate improved activity tolerance OT Short Term Goal 4 (Week 1): Pt will don shirt wiht supervision  Skilled Therapeutic Interventions/Progress Updates:    Pt completed ADL session during OT this am.  Pt needed mod assist for transition to sitting from supine.  Stedy used for transfer from the bed to the shower bench.  Max assist to remove gripper socks once in the shower, but he could complete doffing brief and shirt.  Bathing sit to stand with overall mod assist.  Pt with increased fatigue requiring frequent rest breaks, and sitting in flexed head and trunk posture.  Stedy also utilized for transfer from tub bench over to the 3:1 per request with pt having successful BM.  He was able to complete toilet hygiene in sitting with supervision.  Therapist assisted with donning cream and new brief.  Stedy utilized for transfer out to the bed where he completed UB dressing with supervision and donning pants with overall mod assist sit to stand.  Pt re-positioned in supine to rest in anticipation of PT at 11:00.  Call button and phone in reach.    Therapy Documentation Precautions:  Precautions Precautions: Fall Precaution Comments: chest and ribs sore from CPR Restrictions Weight Bearing Restrictions: No Other Position/Activity Restrictions:    Pain: Pain Assessment Pain Scale: Faces Pain Score: 3  Faces Pain Scale: Hurts little more Pain Type: Chronic pain Pain Location: Back Pain  Orientation: Lower Pain Descriptors / Indicators: Discomfort;Grimacing Pain Frequency: Constant Pain Onset: With Activity Patients Stated Pain Goal: 4 Pain Intervention(s): Repositioned;Emotional support ADL: See Function Navigator for Current Functional Status.   Therapy/Group: Individual Therapy  Zendaya Groseclose OTR/L 01/05/2018, 9:24 AM

## 2018-01-05 NOTE — Plan of Care (Signed)
  Problem: Consults Goal: RH GENERAL PATIENT EDUCATION Description See Patient Education module for education specifics. Outcome: Progressing Goal: Skin Care Protocol Initiated - if Braden Score 18 or less Description If consults are not indicated, leave blank or document N/A Outcome: Progressing   Problem: RH BOWEL ELIMINATION Goal: RH STG MANAGE BOWEL WITH ASSISTANCE Description STG Manage Bowel with mod Assistance.  Outcome: Progressing Goal: RH STG MANAGE BOWEL W/MEDICATION W/ASSISTANCE Description STG Manage Bowel with Medication with mod Assistance.  Outcome: Progressing Goal: RH STG MANAGE BOWEL W/EQUIPMENT W/ASSISTANCE Description STG Manage Bowel With Equipment With Assistance Outcome: Progressing Goal: RH OTHER STG BOWEL ELIMINATION GOALS W/ASSIST Description Other STG Bowel Elimination Goals With Assistance. Outcome: Progressing   Problem: RH BLADDER ELIMINATION Goal: RH STG MANAGE BLADDER WITH ASSISTANCE Description STG Manage Bladder With mod Assistance  Outcome: Progressing Goal: RH STG MANAGE BLADDER WITH MEDICATION WITH ASSISTANCE Description STG Manage Bladder With Medication With mod Assistance.  Outcome: Progressing Goal: RH STG MANAGE BLADDER WITH EQUIPMENT WITH ASSISTANCE Description STG Manage Bladder With Equipment With mod Assistance  Outcome: Progressing   Problem: RH SKIN INTEGRITY Goal: RH STG SKIN FREE OF INFECTION/BREAKDOWN Description With mod assistance  Outcome: Progressing Goal: RH STG MAINTAIN SKIN INTEGRITY WITH ASSISTANCE Description STG Maintain Skin Integrity With mod Assistance.  Outcome: Progressing Goal: RH STG ABLE TO PERFORM INCISION/WOUND CARE W/ASSISTANCE Description STG Able To Perform Incision/Wound Care With max Assistance.  Outcome: Progressing   Problem: RH SAFETY Goal: RH STG ADHERE TO SAFETY PRECAUTIONS W/ASSISTANCE/DEVICE Description STG Adhere to Safety Precautions With min Assistance/Device.  Outcome:  Progressing   Problem: RH PAIN MANAGEMENT Goal: RH STG PAIN MANAGED AT OR BELOW PT'S PAIN GOAL Description <2 out of 10 on a scale from 1-10  Outcome: Progressing

## 2018-01-05 NOTE — Progress Notes (Signed)
Patient has home CPAP at bedside.  Patient refused CPAP tonight.

## 2018-01-05 NOTE — Progress Notes (Signed)
Patient has had better day today.  He was able to tolerate therapy sessions and states his pain was much better controlled after restarting his prednisone.  Patient still attempts to sleep during the day, therefore adjusted environment to promote proper sleep/wake cycle so that patient is not awake at night and asleep during the day.  Patient also seems more alert and oriented, very minimal confusion observed.  Wife is thrilled with patients current progress.  Will continue to monitor.  Brita Romp, RN

## 2018-01-05 NOTE — Progress Notes (Signed)
[place] PHYSICAL MEDICINE & REHABILITATION     PROGRESS NOTE    Subjective/Complaints: Pt seemed to have a better session with therapy this morning. Was up for a long time in chair yesterday and it really bothered his back. He further aggravated it by twisting in chair trying to get comfortable. More alert this mornign too  ROS: Patient denies fever, rash, sore throat, blurred vision, nausea, vomiting, diarrhea, cough, shortness of breath or chest pain, joint or back pain, headache, or mood change. .  Objective:  No results found. Recent Labs    01/04/18 0635  WBC 12.0*  HGB 9.5*  HCT 31.8*  PLT 451*   Recent Labs    01/04/18 0635  NA 141  K 4.1  CL 106  GLUCOSE 116*  BUN 28*  CREATININE 1.53*  CALCIUM 9.6   CBG (last 3)  No results for input(s): GLUCAP in the last 72 hours.  Wt Readings from Last 3 Encounters:  01/05/18 89.8 kg (198 lb)  12/30/17 94.5 kg (208 lb 5.4 oz)  12/09/17 98.9 kg (218 lb)     Intake/Output Summary (Last 24 hours) at 01/05/2018 1059 Last data filed at 01/05/2018 0800 Gross per 24 hour  Intake 480 ml  Output -  Net 480 ml    Vital Signs: Blood pressure (!) 123/97, pulse 68, temperature 98.4 F (36.9 C), temperature source Oral, resp. rate 18, height 6\' 3"  (1.905 m), weight 89.8 kg (198 lb), SpO2 98 %. Physical Exam:  Constitutional: No distress . Vital signs reviewed. HEENT: EOMI, oral membranes moist Neck: supple Cardiovascular: RRR without murmur. No JVD    Respiratory: CTA Bilaterally without wheezes or rales. Normal effort    GI: BS +, non-tender, non-distended  Musc: No edema or tenderness in extremities. Neurological:  Alert and oriented 2 much more alert. Follows commands. .  Motor: B/l UE 4-/5 prox to distal. (Stable) B/l LE: 3+/5 proximal to distal.(Stable) Psychiatric: Remains pleasantly confused  Assessment/Plan: 1. Functional deficits secondary to urosepsis and subsequent encephalopathy and debility which  require 3+ hours per day of interdisciplinary therapy in a comprehensive inpatient rehab setting. Physiatrist is providing close team supervision and 24 hour management of active medical problems listed below. Physiatrist and rehab team continue to assess barriers to discharge/monitor patient progress toward functional and medical goals.  Function:  Bathing Bathing position   Position: Shower  Bathing parts Body parts bathed by patient: Right arm, Left arm, Abdomen, Front perineal area, Chest, Right upper leg, Left upper leg Body parts bathed by helper: Back, Left lower leg, Right lower leg, Buttocks  Bathing assist Assist Level: Touching or steadying assistance(Pt > 75%)      Upper Body Dressing/Undressing Upper body dressing   What is the patient wearing?: Pull over shirt/dress     Pull over shirt/dress - Perfomed by patient: Thread/unthread right sleeve, Thread/unthread left sleeve, Put head through opening Pull over shirt/dress - Perfomed by helper: Pull shirt over trunk        Upper body assist Assist Level: Supervision or verbal cues      Lower Body Dressing/Undressing Lower body dressing   What is the patient wearing?: Pants, Non-skid slipper socks     Pants- Performed by patient: Thread/unthread right pants leg, Thread/unthread left pants leg Pants- Performed by helper: Pull pants up/down Non-skid slipper socks- Performed by patient: Don/doff right sock Non-skid slipper socks- Performed by helper: Don/doff right sock, Don/doff left sock  TED Hose - Performed by patient: (pt dependent in all LB dressing per OT note) TED Hose - Performed by helper: Don/doff right TED hose, Don/doff left TED hose  Lower body assist Assist for lower body dressing: (total)      Toileting Toileting Toileting activity did not occur: No continent bowel/bladder event Toileting steps completed by patient: Adjust clothing prior to toileting, Performs perineal hygiene(with use  of Stedy sit to stand) Toileting steps completed by helper: Adjust clothing after toileting    Toileting assist Assist level: (mod A)   Transfers Chair/bed transfer   Chair/bed transfer method: Stand pivot Chair/bed transfer assist level: Total assist (Pt < 25%) Chair/bed transfer assistive device: Mechanical lift Mechanical lift: Ecologist     Max distance: 2'(able to take a few steps) Assist level: Maximal assist (Pt 25 - 49%)   Wheelchair   Type: Manual Max wheelchair distance: 15' Assist Level: Touching or steadying assistance (Pt > 75%)  Cognition Comprehension Comprehension assist level: Follows basic conversation/direction with extra time/assistive device  Expression Expression assist level: Expresses basic 75 - 89% of the time/requires cueing 10 - 24% of the time. Needs helper to occlude trach/needs to repeat words.  Social Interaction Social Interaction assist level: Interacts appropriately 75 - 89% of the time - Needs redirection for appropriate language or to initiate interaction.  Problem Solving Problem solving assist level: Solves basic 50 - 74% of the time/requires cueing 25 - 49% of the time  Memory Memory assist level: Recognizes or recalls 50 - 74% of the time/requires cueing 25 - 49% of the time   Medical Problem List and Plan:  1. Decreased functional mobility secondary to encephalopathy due to urosepsis/multi-medical   Cont CIR  2. DVT Prophylaxis/Anticoagulation: Eliquis. Monitor for any bleeding episodes  3. Pain Management: Lidoderm patch, topicals  avoiding narcotics due to sedation and confusion  schedule tylenol 650mg  qid   Schedule kpad,   Pt states that being up in the chair for long periods of time really bothers his back  -team has modified therapy plan to allow restbreaks and bed time 4. Mood: Aricept 10 mg nightly, Cymbalta 30 mg nightly  5. Neuropsych: This patient is not capable of making decisions on his own behalf.  6.  Skin/Wound Care: Stage I sacral pressure injury. Routine skin checks  7. Fluids/Electrolytes/Nutrition: Routine in and outs  Encourage PO 8. ID. IV Rocephin completed 12/29/2017.  9. V. tach cardiac arrest with history of PAF/pacemaker. Patient currently off Tikosyn until follow-up outpatient with cardiology services.  10. Chronic systolic congestive heart failure. Monitor for any signs of fluid overload.   Lasix 40 mg   Filed Weights   01/03/18 0444 01/04/18 0449 01/05/18 0417  Weight: 94 kg (207 lb 3.7 oz) 94.1 kg (207 lb 7.3 oz) 89.8 kg (198 lb)    ?  Maintain same lasix dosing 11. Psoriatic arthritis. Methotrexate and chronic prednisone currently on hold.    -We will resume medications: prednisone 5mg  daily and MTX 15mg  every Monday 12. Hypertension. Toprol-XL 25 mg daily. Monitor with increased mobility   BP remains borderline to normal today 13. Hyperlipidemia. Lipitor  14. CKD stage III.   Creatinine 1.53 on 7/17 15. History of prostate cancer status post radiation. Patient on Flomax prior to admission 0.4 mg daily. Renal ultrasound no hydronephrosis. Follow-up outpatient Dr. Philis Pique [place] on 7/15 16. Acute on chronic anemia. Continue iron supplement.   Hemoglobin 9.5 on 7/17  No outward  signs of bleeding  Continue to monitor labs and clinical [place] 17. Hypoalbuminemia  Supplement initiated 18. Leukocytosis  WBCs 13.5 on 7/15--- down to 12.0 on 7/17  Afebrile  UA -/equivocal, urine culture is negative  No active signs of infection    LOS (Days) 6 A FACE TO FACE EVALUATION WAS PERFORMED  [name], MD 01/05/2018 10:59 AM

## 2018-01-05 NOTE — Progress Notes (Signed)
Speech Language Pathology Daily Session Note  Patient Details  Name: FRANKE MENTER MRN: [id number] Date of Birth: 08/23/1933  Today's Date: 01/05/2018 SLP Individual Time: 1300-1400 SLP Individual Time Calculation (min): 60 min  Short Term Goals: Week 1: SLP Short Term Goal 1 (Week 1): Pt will use external aids to facilitate recall of daily information with min assist.   SLP Short Term Goal 2 (Week 1): Pt will use environmental cues to facilitate orientation to date and situation with min assist.  SLP Short Term Goal 3 (Week 1): Pt will complete mildly complex tasks with min assist for functional problem solving.   SLP Short Term Goal 4 (Week 1): Pt will sustain his attention to functional tasks for 15 min intervals with min cues for redirection.    Skilled Therapeutic Interventions: Skilled treatment session focused on cognition goals. SLP facilitated session by assisting with transfer via Stedy from wheelchair to bed. Pt not able to recall steps of locking wheelchair but able to follow 1 step directions for transfer safely. With heavy question cues, pt able to recall 2 activities from previous therapy session in day. Pt able to sustain attention to tasks for ~ 10 minutes with mod A cues d/t fatigue. Pt able to use calendar to locate orientation information. Pt was left supine in bed with all needs within reach. Continue per current plan of care.      Function:    Cognition Comprehension Comprehension assist level: Follows basic conversation/direction with extra time/assistive device  Expression   Expression assist level: Expresses basic 75 - 89% of the time/requires cueing 10 - 24% of the time. Needs helper to occlude trach/needs to repeat words.  Social Interaction Social Interaction assist level: Interacts appropriately 75 - 89% of the time - Needs redirection for appropriate language or to initiate interaction.  Problem Solving Problem solving assist level: Solves basic 50 - 74% of  the time/requires cueing 25 - 49% of the time  Memory Memory assist level: Recognizes or recalls 50 - 74% of the time/requires cueing 25 - 49% of the time    Pain Pain Assessment Pain Scale: 0-10 Pain Score: 4  Pain Type: Acute pain Pain Location: Back Pain Orientation: Lower Pain Descriptors / Indicators: Aching Pain Onset: On-going Patients Stated Pain Goal: 3 Pain Intervention(s): Medication (See eMAR)  Therapy/Group: Individual Therapy  Issabella Rix 01/05/2018, 2:05 PM

## 2018-01-06 ENCOUNTER — Inpatient Hospital Stay (HOSPITAL_COMMUNITY): Payer: Self-pay | Admitting: Speech Pathology

## 2018-01-06 ENCOUNTER — Inpatient Hospital Stay (HOSPITAL_COMMUNITY): Payer: Self-pay | Admitting: Occupational Therapy

## 2018-01-06 ENCOUNTER — Inpatient Hospital Stay (HOSPITAL_COMMUNITY): Payer: Self-pay | Admitting: Physical Therapy

## 2018-01-06 LAB — BASIC METABOLIC PANEL
Anion gap: 8 (ref 5–15)
BUN: 28 mg/dL — AB (ref 8–23)
CALCIUM: 9.4 mg/dL (ref 8.9–10.3)
CO2: 25 mmol/L (ref 22–32)
Chloride: 107 mmol/L (ref 98–111)
Creatinine, Ser: 1.43 mg/dL — ABNORMAL HIGH (ref 0.61–1.24)
GFR calc Af Amer: 51 mL/min — ABNORMAL LOW (ref >60)
GFR, EST NON AFRICAN AMERICAN: 44 mL/min — AB (ref >60)
GLUCOSE: 105 mg/dL — AB (ref 70–99)
POTASSIUM: 3.8 mmol/L (ref 3.5–5.1)
SODIUM: 140 mmol/L (ref 135–145)

## 2018-01-06 NOTE — Plan of Care (Signed)
  Problem: RH BOWEL ELIMINATION Goal: RH STG MANAGE BOWEL WITH ASSISTANCE Description STG Manage Bowel with mod Assistance.  Outcome: Progressing Goal: RH STG MANAGE BOWEL W/MEDICATION W/ASSISTANCE Description STG Manage Bowel with Medication with mod Assistance.  Outcome: Progressing   Problem: RH SKIN INTEGRITY Goal: RH STG SKIN FREE OF INFECTION/BREAKDOWN Description With mod assistance  Outcome: Progressing Goal: RH STG MAINTAIN SKIN INTEGRITY WITH ASSISTANCE Description STG Maintain Skin Integrity With mod Assistance.  Outcome: Progressing Goal: RH STG ABLE TO PERFORM INCISION/WOUND CARE W/ASSISTANCE Description STG Able To Perform Incision/Wound Care With max Assistance.  Outcome: Progressing   Problem: RH SAFETY Goal: RH STG ADHERE TO SAFETY PRECAUTIONS W/ASSISTANCE/DEVICE Description STG Adhere to Safety Precautions With min Assistance/Device.  Outcome: Progressing   Problem: RH PAIN MANAGEMENT Goal: RH STG PAIN MANAGED AT OR BELOW PT'S PAIN GOAL Description <2 out of 10 on a scale from 1-10  Outcome: Progressing   Problem: RH BOWEL ELIMINATION Goal: RH STG MANAGE BOWEL W/EQUIPMENT W/ASSISTANCE Description STG Manage Bowel With Equipment With Assistance Outcome: Not Applicable Goal: RH OTHER STG BOWEL ELIMINATION GOALS W/ASSIST Description Other STG Bowel Elimination Goals With Assistance. Outcome: Not Applicable   Problem: RH BLADDER ELIMINATION Goal: RH STG MANAGE BLADDER WITH ASSISTANCE Description STG Manage Bladder With mod Assistance  Outcome: Not Applicable Goal: RH STG MANAGE BLADDER WITH MEDICATION WITH ASSISTANCE Description STG Manage Bladder With Medication With mod Assistance.  Outcome: Not Applicable Goal: RH STG MANAGE BLADDER WITH EQUIPMENT WITH ASSISTANCE Description STG Manage Bladder With Equipment With mod Assistance  Outcome: Not Applicable    Patient is incontinent of urine (patient baseline)

## 2018-01-06 NOTE — Progress Notes (Signed)
Speech Language Pathology Weekly Progress and Session Note  Patient Details  Name: Christopher Baker MRN: [id number] Date of Birth: 10-Mar-1934  Beginning of progress report period: December 31, 2017 End of progress report period: January 06, 2018  Today's Date: 01/06/2018 SLP Individual Time: 1130-1200 SLP Individual Time Calculation (min): 30 min  Short Term Goals: Week 1: SLP Short Term Goal 1 (Week 1): Pt will use external aids to facilitate recall of daily information with min assist.   SLP Short Term Goal 1 - Progress (Week 1): Progressing toward goal SLP Short Term Goal 2 (Week 1): Pt will use environmental cues to facilitate orientation to date and situation with min assist.  SLP Short Term Goal 2 - Progress (Week 1): Progressing toward goal SLP Short Term Goal 3 (Week 1): Pt will complete mildly complex tasks with min assist for functional problem solving.   SLP Short Term Goal 3 - Progress (Week 1): Progressing toward goal SLP Short Term Goal 4 (Week 1): Pt will sustain his attention to functional tasks for 15 min intervals with min cues for redirection.   SLP Short Term Goal 4 - Progress (Week 1): Progressing toward goal    New Short Term Goals: Week 2: SLP Short Term Goal 1 (Week 2): Pt will use external aids to facilitate recall of daily information with min assist.   SLP Short Term Goal 2 (Week 2): Pt will use environmental cues to facilitate orientation to date and situation with min assist.  SLP Short Term Goal 3 (Week 2): Pt will complete mildly complex tasks with min assist for functional problem solving.   SLP Short Term Goal 4 (Week 2): Pt will sustain his attention to functional tasks for 15 min intervals with min cues for redirection.    Weekly Progress Updates: Pt has made minimal gains this reporting period and his progress and participation in therapy session has been limited by pain and overall fatigue that is further complicated by decreased awareness of need for therapy  targeting cognition. As a result, pt continues to require Mod A for use of external/environmental aids, recall of information, problem solving and sustained attention. Skilled ST is required to target these deficits and decreased caregiver burden. At this time, pt might require Min Assist at home and uncertain if wife can provide this level of care. Will continue per current plan of care.      Intensity: Minumum of 1-2 x/day, 30 to 90 minutes Frequency: 3 to 5 out of 7 days Duration/Length of Stay: 7/31 Treatment/Interventions: Cognitive remediation/compensation;Cueing hierarchy;Functional tasks;Patient/family education;Internal/external aids;Environmental controls   Daily Session  Skilled Therapeutic Interventions: Skilled treatment session focused on cognition goals. SLP faiclitated session by providing problem solving task of placing colored tissue paper on paper mache pinata. Pt required Mod A continuous cues for task initiation, basic problem solving (getting the paper to stick ) and to maintain task. Pt frequently wanted to stop d/t fatigue but activity required no physical exertion. Education provided on need to gain endurance with tasks. Pt was returned to room was left upright in wheelchair, belt alarm on and all needs within reach. Continue per current plan of care.     Function:   Eating Eating   Modified Consistency Diet: No Eating Assist Level: Set up assist for;More than reasonable amount of time   Eating Set Up Assist For: Opening containers       Cognition Comprehension Comprehension assist level: Understands basic 75 - 89% of the time/ requires cueing 10 -  24% of the time  Expression   Expression assist level: Expresses basic 75 - 89% of the time/requires cueing 10 - 24% of the time. Needs helper to occlude trach/needs to repeat words.  Social Interaction Social Interaction assist level: Interacts appropriately 75 - 89% of the time - Needs redirection for appropriate  language or to initiate interaction.;Interacts appropriately 50 - 74% of the time - May be physically or verbally inappropriate.  Problem Solving Problem solving assist level: Solves basic 50 - 74% of the time/requires cueing 25 - 49% of the time  Memory Memory assist level: Recognizes or recalls 25 - 49% of the time/requires cueing 50 - 75% of the time   General    Pain Pain Assessment Pain Scale: 0-10 Pain Score: 5  Pain Type: Chronic pain Pain Location: Back Pain Orientation: Lower Pain Descriptors / Indicators: Aching Pain Frequency: Constant Pain Onset: On-going Patients Stated Pain Goal: 3 Pain Intervention(s): Medication (See eMAR)  Therapy/Group: Individual Therapy  Amani Nodarse 01/06/2018, 2:31 PM

## 2018-01-06 NOTE — Patient Care Conference (Signed)
Inpatient RehabilitationTeam Conference and Plan of Care Update Date: 01/04/2018   Time: 11:40 AM    Patient Name: Christopher Baker      Medical Record Number: [id number]  Date of Birth: Nov 29, 1933 Sex: Male         Room/Bed: 4W23C/4W23C-01 Payor Info: Payor: [employer] / Plan: [employer] / Product Type: *No Product type* /    Admitting Diagnosis: debility  sepsis  Admit Date/Time:  12/30/2017  3:43 PM Admission Comments: No comment available   Primary Diagnosis:  <principal problem not specified> Principal Problem: <principal problem not specified>  Patient Active Problem List   Diagnosis Date Noted  . Anemia of chronic disease   . Dementia without behavioral disturbance   . Chronic systolic (congestive) heart failure ([place])   . Psoriatic arthritis ([place])   . Stage 3 chronic kidney disease ([place])   . Urinary frequency   . Acute blood loss anemia   . Hypoalbuminemia due to protein-calorie malnutrition ([place])   . Encephalopathy 12/30/2017  . Atrial fibrillation with rapid ventricular response ([place])   . Acute respiratory failure with hypoxemia ([place])   . Aspiration pneumonia of right upper lobe due to gastric secretions ([place])   . Drug-induced torsades de pointes 12/24/2017  . Sepsis ([place]) 12/23/2017  . Ventricular tachycardia ([place]) 12/23/2017  . Pressure injury of skin 12/23/2017  . E coli bacteremia 12/23/2017  . Urinary tract infection 12/23/2017  . Acute pulmonary edema ([place]) 12/23/2017  . Sepsis, unspecified organism ([place]) 10/30/2017  . Acute urinary retention 10/30/2017  . Chronic systolic congestive heart failure ([place]) 09/07/2017  . S/P shoulder replacement 06/27/2015  . Residual cognitive deficit as late effect of stroke 04/15/2015  . Lung nodule   . Weakness generalized 12/11/2014  . Leukocytosis 12/11/2014  . Restless leg syndrome 12/11/2014  . Normocytic anemia 12/11/2014  . Abnormal EKG 12/11/2014  . Gait disturbance, post-stroke 10/22/2014  .  Partial seizures ([place]) 06/18/2014  . Vitamin B12 deficiency 06/18/2014  . OSA (obstructive sleep apnea) 02/26/2014  . Cognitive deficits as late effect of cerebrovascular disease 01/11/2014  . Fatigue 09/20/2013  . Intermittent lightheadedness 06/19/2013  . Low blood pressure, not hypotension 06/19/2013  . Vascular dementia 04/09/2013  . History of completed stroke 04/03/2013  . Occlusion and stenosis of carotid artery without mention of cerebral infarction 11/21/2012  . OA (osteoarthritis) of knee 12/20/2011  . Methotrexate, long term, current use 09/10/2011  . Knee pain, left 07/27/2011  . Pacemaker-Medtronic 09/08/2010  . UNSPECIFIED VISUAL DISTURBANCE 07/20/2010  . IRON DEFIC ANEMIA [place] DIET IRON INTAKE 04/20/2010  . PSORIASIS 04/20/2010  . DISUSE OSTEOPOROSIS 05/12/2009  . SPONDYLOSIS, LUMBAR, WITH RADICULOPATHY 10/15/2008  . OTHER SPECIFIED DERMATOMYCOSES 06/24/2008  . VARICOSE VEINS LOWER EXTREMITIES W/INFLAMMATION 12/25/2007  . INSOMNIA WITH SLEEP APNEA UNSPECIFIED 08/31/2007  . GERD 03/23/2007  . Atrial fibrillation, chronic ([place]) 01/31/2007  . PSORIATIC ARTHRITIS 01/31/2007  . Hyperlipidemia 01/19/2007  . Essential hypertension 01/19/2007  . History of prostate cancer 01/19/2007    Expected Discharge Date: Expected Discharge Date: 01/18/18  Team Members Present: Physician leading conference: Dr. Alysia Penna Social Worker Present: Lennart Pall, LCSW Nurse Present: Other (comment)(Denise Earnie Larsson, RN) PT Present: Barrie Folk, PT;Rosita Dechalus, PTA OT Present: Clyda Greener, OT SLP Present: Windell Moulding, SLP PPS Coordinator present : Daiva Nakayama, RN, CRRN     Current Status/Progress Goal Weekly Team Focus  Medical   debility and encephalopathy related to urosepsis.  Patient also with psoriatic arthritis and cardiomyopathy.  He has chronic  radiation cystitis as well which is causes urinary frequency and incontinence at times  Improve medical stability to allow  increased participation in therapy  See medical progress notes   Bowel/Bladder   Patient is continent of bowel and bladder with episodes of incontinecy   Continue to encourage resident to toilet frequently   monitor    Swallow/Nutrition/ Hydration             ADL's   Min assist for UB bathing with max assist for LB bathing.  Total assist for LB dressing with min assist for UB dressing.  He needs total assist for standpivot transfers to the [facility] or toilet.  Decreased sustained attention, requiring max instructional cueing for sustained attention  currently supervision level  selfcare retraining, transfer training, balance retraining, cognitive retraining, pt/family education, DME/AE education   Mobility             Communication             Safety/Cognition/ Behavioral Observations  Mod-max assist, limited due to pain and fatigue    supervision   encourage participation out of bed, problem solving, memory, attention   Pain   Patient states pain level is 7/10   assess for s/sx relating to pain q shift and PRN and Tx  monitor q shift an PRN    Skin   Stage 1 to bottom, MASD to groin and peri area, bruising to uper extremties  assess skin q shift and PRN   remain free of any other skin breakdown     Rehab Goals Patient on target to meet rehab goals: Yes *See Care Plan and progress notes for long and short-term goals.     Barriers to Discharge  Current Status/Progress Possible Resolutions Date Resolved   Physician    Medical stability        Improved medical stability and reduce infusion, maximize sleep      Nursing                  PT                    OT                  SLP                SW                Discharge Planning/Teaching Needs:  Plan is for patient to [place] home with wife who can provide 24/7 assistance  Teaching to be completed prior to discharge   Team Discussion:  Urosepsis/ debility.  Doing better today with OT and a little more alert but do recommend  decreasing schedule to 15/7.  Has a stage 1-2 on sacrum.  Min-mod for bed mobility.  Has taken on ly a few steps in parallel bars.  Min a - max assist to stand with STEDY.  Need a very stimulating environment to stay awake.  Goals for supervision, but these may need to be downgraded.  Revisions to Treatment Plan:  Change to 15/7 schedule.    Continued Need for Acute Rehabilitation Level of Care: The patient requires daily medical management by a physician with specialized training in physical medicine and rehabilitation for the following conditions: Daily direction of a multidisciplinary physical rehabilitation program to ensure safe treatment while eliciting the highest outcome that is of practical value to the patient.: Yes Daily medical management of patient stability for increased activity  during participation in an intensive rehabilitation regime.: Yes Daily analysis of laboratory values and/or radiology reports with any subsequent need for medication adjustment of medical intervention for : Neurological problems;Cardiac problems;Urological problems  Ashtynn Berke 01/06/2018, 4:08 PM

## 2018-01-06 NOTE — Progress Notes (Signed)
Physical Therapy Session Note  Patient Details  Name: Christopher Baker MRN: [id number] Date of Birth: 1934-02-12  Today's Date: 01/06/2018 PT Individual Time:1100-1130 AND 1300-1400 30 min and 60 min      Short Term Goals: Week 1:  PT Short Term Goal 1 (Week 1): Pt will perform squat pivot transfer bed to/from w/c with max A PT Short Term Goal 2 (Week 1): Pt will ambulate x 10 ft with LRAD and max A PT Short Term Goal 3 (Week 1): Pt will propel manual w/c x 50 ft with min A  Skilled Therapeutic Interventions/Progress Updates:   Session 1  Pt received sitting in WC and agreeable to PT. Pt transported to orthogym in [place]. Car transfer instructed by PT with moderate assist, and UE support on RW. Moderate multimodal cues for safety, set up, technique and gait pattern. Prolonged rest break required following transfer due to fatigue. Patient returned to room and left sitting in [facility] with call bell in reach and all needs met.    Session 2.  Pt received supine in bed and agreeable to PT. Supine>sit transfer with moderate assist and moderate cues for safety and log roll technique to reduce back pain.   Stand pivot transfer with moderate assist from elevated bed height with min cues for safety.   Nustep endurance and strengthening therex . X 10 min, level with min cues for proper speed and full ROM throughout. Pt rated Borg RPE 14/20 upon completion   UE therex.  Overhead ball toss 2 x 1 min  Arm shuttle. 2 x 1 min  Min-mod cues for full ROM and improve force to enhance strengthening and endurance benefits of movement.   Pt returned to room and performed stand pivot  transfer to bed with moderate assist and RW. Sit>supine completed with moderate assist to control BLE, and left supine in bed with call bell in reach and all needs met.       Therapy Documentation Precautions:  Precautions Precautions: Fall Precaution Comments: chest and ribs sore from CPR, chronic back pain Restrictions Weight  Bearing Restrictions: No Other Position/Activity Restrictions:   :  Pain: Pain Assessment Pain Scale: 0-10 Pain Score: 5  Faces Pain Scale: Hurts little more Pain Type: Chronic pain Pain Location: Back Pain Orientation: Lower Pain Descriptors / Indicators: Aching Pain Frequency: Constant Pain Onset: On-going Patients Stated Pain Goal: 3 Pain Intervention(s): Medication (See eMAR)  See Function Navigator for Current Functional Status.   Therapy/Group: Individual Therapy  Lorie Phenix 01/06/2018, 1:51 PM

## 2018-01-06 NOTE — Progress Notes (Signed)
Social Work Patient ID: Christopher Baker, male   DOB: [birth year], 84 y.o.   MRN: [id number]   Have reviewed team conference with both patient and wife and both aware/ agreeable with targeted discharge date of 7/31.  Wife pleased that patient to more alert today and hopeful he can reach at least a minimal assistance to discharge home.  We will continue to follow.  Jillayne Witte, LCSW

## 2018-01-06 NOTE — Progress Notes (Signed)
[place] PHYSICAL MEDICINE & REHABILITATION     PROGRESS NOTE    Subjective/Complaints: Again fairly well with OT this morning. Had a better day overall. States he didn't sleep well last night. Back pain still at times  ROS: Patient denies fever, rash, sore throat, blurred vision, nausea, vomiting, diarrhea, cough, shortness of breath or chest pain,   headache, or mood change. .  Objective:  No results found. Recent Labs    01/04/18 0635  WBC 12.0*  HGB 9.5*  HCT 31.8*  PLT 451*   Recent Labs    01/04/18 0635 01/06/18 0532  NA 141 140  K 4.1 3.8  CL 106 107  GLUCOSE 116* 105*  BUN 28* 28*  CREATININE 1.53* 1.43*  CALCIUM 9.6 9.4   CBG (last 3)  No results for input(s): GLUCAP in the last 72 hours.  Wt Readings from Last 3 Encounters:  01/06/18 89.8 kg (198 lb)  12/30/17 94.5 kg (208 lb 5.4 oz)  12/09/17 98.9 kg (218 lb)     Intake/Output Summary (Last 24 hours) at 01/06/2018 1108 Last data filed at 01/05/2018 1805 Gross per 24 hour  Intake 240 ml  Output -  Net 240 ml    Vital Signs: Blood pressure (!) 143/72, pulse 61, temperature 98.4 F (36.9 C), resp. rate 18, height 6\' 3"  (1.905 m), weight 89.8 kg (198 lb), SpO2 99 %. Physical Exam:  Constitutional: No distress . Vital signs reviewed. HEENT: EOMI, oral membranes moist Neck: supple Cardiovascular: RRR without murmur. No JVD    Respiratory: CTA Bilaterally without wheezes or rales. Normal effort    GI: BS +, non-tender, non-distended  Musc: No edema or tenderness in extremities. Neurological:  Alert and oriented 2. remains more alert. Follows commands. .  Motor: B/l UE 4-/5 prox to distal.   B/l LE: 3+/5 proximal to distal  Psychiatric: pleasant and cooperative  Assessment/Plan: 1. Functional deficits secondary to urosepsis and subsequent encephalopathy and debility which require  interdisciplinary therapy in a comprehensive inpatient rehab setting. Physiatrist is providing close team  supervision and 24 hour management of active medical problems listed below. Physiatrist and rehab team continue to assess barriers to discharge/monitor patient progress toward functional and medical goals.  Function:  Bathing Bathing position   Position: Sitting EOB  Bathing parts Body parts bathed by patient: Right arm, Left arm, Abdomen, Front perineal area, Chest, Right upper leg, Left upper leg Body parts bathed by helper: Back, Left lower leg, Right lower leg, Buttocks  Bathing assist Assist Level: Touching or steadying assistance(Pt > 75%)      Upper Body Dressing/Undressing Upper body dressing   What is the patient wearing?: Pull over shirt/dress     Pull over shirt/dress - Perfomed by patient: Thread/unthread right sleeve, Thread/unthread left sleeve, Put head through opening Pull over shirt/dress - Perfomed by helper: Pull shirt over trunk        Upper body assist Assist Level: Supervision or verbal cues      Lower Body Dressing/Undressing Lower body dressing   What is the patient wearing?: Pants, Ted Hose     Pants- Performed by patient: Thread/unthread right pants leg, Thread/unthread left pants leg Pants- Performed by helper: Pull pants up/down Non-skid slipper socks- Performed by patient: Don/doff right sock Non-skid slipper socks- Performed by helper: Don/doff right sock, Don/doff left sock             TED Hose - Performed by patient: (pt dependent in all LB dressing per OT note)  TED Hose - Performed by helper: Don/doff right TED hose, Don/doff left TED hose  Lower body assist Assist for lower body dressing: (total)      Toileting Toileting Toileting activity did not occur: No continent bowel/bladder event Toileting steps completed by patient: Performs perineal hygiene Toileting steps completed by helper: Adjust clothing prior to toileting, Adjust clothing after toileting    Toileting assist Assist level: Two helpers   Transfers Chair/bed transfer    Chair/bed transfer method: Stand pivot Chair/bed transfer assist level: Moderate assist (Pt 50 - 74%/lift or lower) Chair/bed transfer assistive device: Mechanical lift Mechanical lift: Stedy   Locomotion Ambulation     Max distance: 5 Assist level: Moderate assist (Pt 50 - 74%)   Wheelchair   Type: Manual Max wheelchair distance: 19ft Assist Level: Supervision or verbal cues  Cognition Comprehension Comprehension assist level: Understands basic 75 - 89% of the time/ requires cueing 10 - 24% of the time  Expression Expression assist level: Expresses basic 75 - 89% of the time/requires cueing 10 - 24% of the time. Needs helper to occlude trach/needs to repeat words.  Social Interaction Social Interaction assist level: Interacts appropriately 75 - 89% of the time - Needs redirection for appropriate language or to initiate interaction.  Problem Solving Problem solving assist level: Solves basic 50 - 74% of the time/requires cueing 25 - 49% of the time  Memory Memory assist level: Recognizes or recalls 25 - 49% of the time/requires cueing 50 - 75% of the time   Medical Problem List and Plan:  1. Decreased functional mobility secondary to encephalopathy due to urosepsis/multi-medical   Cont CIR   -therapy schedule adjusted to better accommodate his activity tolerance 2. DVT Prophylaxis/Anticoagulation: Eliquis. Monitor for any bleeding episodes  3. Pain Management: Lidoderm patch, topicals  avoiding narcotics due to sedation and confusion  schedule tylenol 650mg  qid   Schedule kpad,   -team has modified therapy plan to allow restbreaks and bed time 4. Mood: Aricept 10 mg nightly, Cymbalta 30 mg nightly  5. Neuropsych: This patient is not capable of making decisions on his own behalf.  6. Skin/Wound Care: Stage I sacral pressure injury. Routine skin checks  7. Fluids/Electrolytes/Nutrition: Routine in and outs  Encourage PO 8. ID. IV Rocephin completed 12/29/2017.  9. V. tach  cardiac arrest with history of PAF/pacemaker. Patient currently off Tikosyn until follow-up outpatient with cardiology services.  10. Chronic systolic congestive heart failure. Monitor for any signs of fluid overload.   Lasix 40 mg   Filed Weights   01/04/18 0449 01/05/18 0417 01/06/18 0436  Weight: 94.1 kg (207 lb 7.3 oz) 89.8 kg (198 lb) 89.8 kg (198 lb)    -  Maintain same lasix dosing 11. Psoriatic arthritis. Methotrexate and chronic prednisone currently on hold.    -resumed medications: prednisone 5mg  daily and MTX 15mg  every Monday 12. Hypertension. Toprol-XL 25 mg daily. Monitor with increased mobility   BP under control 7/19 13. Hyperlipidemia. Lipitor  14. CKD stage III.   Creatinine 1.53 on 7/17----> 1.43 7/19 15. History of prostate cancer status post radiation. Patient on Flomax prior to admission 0.4 mg daily. Renal ultrasound no hydronephrosis. Follow-up outpatient Dr. Amalia Hailey   -consider condom cath at night to help with HS frequency/incontinence (chronic) 16. Acute on chronic anemia. Continue iron supplement.   Hemoglobin 9.5 on 7/17  No outward signs of bleeding  Continue to monitor labs and clinical Eksir 17. Hypoalbuminemia  Supplement initiated 18. Leukocytosis  WBCs 13.5 on  7/15--- down to 12.0 on 7/17  Afebrile  UA -/equivocal, urine culture is negative  No active signs of infection    LOS (Days) 7 A FACE TO FACE EVALUATION WAS PERFORMED  [name], MD 01/06/2018 11:08 AM

## 2018-01-06 NOTE — Progress Notes (Signed)
Occupational Therapy Weekly Progress Note  Patient Details  Name: Christopher Baker MRN: [id number] Date of Birth: 1933/08/28  Beginning of progress report period: December 31, 2017 End of progress report period: January 06, 2018  Today's Date: 01/06/2018 OT Individual Time: 0800-0901 OT Individual Time Calculation (min): 61 min    Patient has met 4 of 4 short term goals.  Pt is making steady progress with OT, but slower than originally expected secondary to increased back pain, decreased tolerance for being out of bed and decreased sustained attention.  Therapy has modified his schedule to 15 hours over 7 days and has allowed rest breaks in-between therapies.  He currently completes UB selfcare at a supervision to min assist level.  LB selfcare is at a max assist level secondary to needing assist with sit to stand and also demonstrating decreased ability to reach his LEs for washing or dressing.  Have begun integrating AE to assist with this.  Transfers are still max assist stand pivot with the RW or total assist if he has to go from bed to bathroom with use of the [place].  Will continue with CIR level therapies to keep increasing his overall strength and endurance for selfcare tasks until expected discharge 7/31.  Feel he will need supervision to min assist level at discharge.  Will continue with current OT goals at this time.    Patient continues to demonstrate the following deficits: muscle weakness, decreased attention, decreased problem solving and decreased memory and decreased standing balance, decreased postural control and decreased balance strategies and therefore will continue to benefit from skilled OT intervention to enhance overall performance with BADL and Reduce care partner burden.  Patient progressing toward long term goals..  Continue plan of care.  OT Short Term Goals Week 2:  OT Short Term Goal 1 (Week 2): Pt will complete toilet transfers with min assist using the RW stand pivot. OT  Short Term Goal 2 (Week 2): Pt will complete LB bathing sit to stand with min assist for two consecutive sessions.  OT Short Term Goal 3 (Week 2): Pt will complete LB dressing with AE sit to stand with min assist for two consecutive sessions.  OT Short Term Goal 4 (Week 2): Pt will complete walk-in shower transfers with min assist using the RW to tub seat.  Skilled Therapeutic Interventions/Progress Updates:    Pt completed bathing and dressing sit to stand from the EOB this session.  He was able to transition from supine to sitting with mod assist.  Still with increased posterior pelvic tilt in sitting, requiring max instructional cueing and min assist to correct during bathing.  He was able to complete sit to stand with mod assist using the RW but needed max assist for pulling garments over hips.  Reacher utilized for removal of gripper socks and for donning pants over feet.  Therapist had to assist with washing lower legs secondary to back pain and no LH sponges available.  Pt reports that he has a LH brush at home that he can use.  Noted bladder incontinence in standing with attempt to toilet using the [facility] for transfer.  Pt unsuccessful in voiding or having BM.  Returned to bed at end of session with setup for breakfast.  He demonstrated increased confusion this session stating his wife had fixed his breakfast and was just outside the room.  Pt re-directed that this was not true and his breakfast was from the hospital instead.  Pt left with call button  and phone in reach.    Therapy Documentation Precautions:  Precautions Precautions: Fall Precaution Comments: chest and ribs sore from CPR, chronic back pain Restrictions Weight Bearing Restrictions: No Other Position/Activity Restrictions:    Pain: Pain Assessment Pain Scale: Faces Pain Score: 4 (working with therapy) Faces Pain Scale: Hurts little more Pain Type: Chronic pain Pain Location: Back Pain Orientation: Lower Pain Descriptors  / Indicators: Discomfort Pain Onset: With Activity Patients Stated Pain Goal: 3 Pain Intervention(s): Repositioned ADL: See Function Navigator for Current Functional Status.   Therapy/Group: Individual Therapy  Javeon Macmurray OTR/L 01/06/2018, 9:57 AM

## 2018-01-06 NOTE — Progress Notes (Signed)
Pt declined use of home CPAP tonight.  Encouraged pt to call if he decides to use machine and needs assistance.

## 2018-01-07 ENCOUNTER — Inpatient Hospital Stay (HOSPITAL_COMMUNITY): Payer: Self-pay | Admitting: Occupational Therapy

## 2018-01-07 ENCOUNTER — Inpatient Hospital Stay (HOSPITAL_COMMUNITY): Payer: Self-pay | Admitting: Physical Therapy

## 2018-01-07 DIAGNOSIS — F0150 Vascular dementia without behavioral disturbance: Secondary | ICD-10-CM

## 2018-01-07 DIAGNOSIS — Z8673 Personal history of transient ischemic attack (TIA), and cerebral infarction without residual deficits: Secondary | ICD-10-CM

## 2018-01-07 DIAGNOSIS — R5381 Other malaise: Principal | ICD-10-CM

## 2018-01-07 NOTE — Progress Notes (Signed)
Physical Therapy Session Note  Patient Details  Name: Christopher Baker MRN: [id number] Date of Birth: [birth year]  Today's Date: 01/07/2018 PT Individual Time: 1100-1200 PT Individual Time Calculation (min): 60 min   Short Term Goals: Week 1:  PT Short Term Goal 1 (Week 1): Pt will perform squat pivot transfer bed to/from w/c with max A PT Short Term Goal 2 (Week 1): Pt will ambulate x 10 ft with LRAD and max A PT Short Term Goal 3 (Week 1): Pt will propel manual w/c x 50 ft with min A  Skilled Therapeutic Interventions/Progress Updates:   Pt received supine in bed and agreeable to PT. Supine>sit transfer with mod assist and min cues for rolling technique to come to EOB.    Stand pivot transfer to [facility] with mod assist. Pt reports need to urination once in toilet.. Pt transported to bathroom in [place]. Stand pivot transfer to toilet. Pt note dto have had incontinent bowel movement. Peri care complete by pt with min set up assist from PT. Prior to and following toileting, PT required to manage clothing with sit<>stand from toilet with mod assist and heavy use of rail. Stand pivot to [facility] with moderate assist and rail on wall for UE support.    WC mobility x 124f with supervision assist and multiple rest breaks due to elevated LPB.   Gait training. With RW x 137fand x 1311fith mod assist from PT. Moderate cues for improved terminal knee extension, AD management and pursed lip breathing to control pain    PT instructed pt in 5xSTS with min-mod assist and UE support on RW x 46 sec.   Patient returned to room and [facility] and all needs met.        Therapy Documentation Precautions:  Precautions Precautions: Fall Precaution Comments: chest and ribs sore from CPR, chronic back pain Restrictions Weight Bearing Restrictions: No Other Position/Activity Restrictions:     Pain: Pain Assessment Pain Scale: 0-10 Pain Score: 0-No pain   See Function Navigator  for Current Functional Status.   Therapy/Group: Individual Therapy  AusLorie Phenix20/2019, 5:37 PM

## 2018-01-07 NOTE — Progress Notes (Signed)
Occupational Therapy Session Note  Patient Details  Name: Christopher Baker MRN: [id number] Date of Birth: July 07, 1933  Today's Date: 01/07/2018 OT Individual Time: 4818-5631 OT Individual Time Calculation (min): 78 min    Short Term Goals: Week 1:  OT Short Term Goal 1 (Week 1): Pt will sit to stand at sink or RW with MOD A OT Short Term Goal 1 - Progress (Week 1): Met OT Short Term Goal 2 (Week 1): Pt will transfer to [facility] with MAX A of 1 OT Short Term Goal 2 - Progress (Week 1): Met OT Short Term Goal 3 (Week 1): Pt will complete functional activity for 5 min wiht no more than 1 rest break to demonstrate improved activity tolerance OT Short Term Goal 3 - Progress (Week 1): Met OT Short Term Goal 4 (Week 1): Pt will don shirt wiht supervision OT Short Term Goal 4 - Progress (Week 1): Met  Skilled Therapeutic Interventions/Progress Updates:   Pt completed bathing and dressing during session.  Mod assist for transfer from supine to sit EOB with mod demonstrational cueing for technique.  Once sitting Stedy was utilized for transfer to the 3:1 over the toilet.  He needed min assist for sit to stand using the [facility] with mod assist to clothing management and toielt hygiene.  Bathing at the shower bench once toileting was completed.  Mod assist for bathing overall sit to stand with decreased ability to reach the RLE for washing and drying the foot.  Stedy utilized again for transfer out to the EOB for dressing tasks.  Increased time and min instructional cueing needed for pt to integrate the reacher for threading brief and pants over feet.  Therapist also presented wide sock aide for use as pt cannot cross the RLE over the left knee to donn his sock, or reach down to the foot.  Mod demonstrational cueing for sockaide.  Pt transitioned to supine with mod assist after donning shirt and brushing his hair with setup.  Cal button and phone in reach with bed alarm in place.     Therapy  Documentation Precautions:  Precautions Precautions: Fall Precaution Comments: chest and ribs sore from CPR, chronic back pain Restrictions Weight Bearing Restrictions: No Other Position/Activity Restrictions:    Pain: Pain Assessment Pain Scale: Faces Faces Pain Scale: Hurts little more Pain Type: Chronic pain Pain Location: Back Pain Orientation: Lower Pain Descriptors / Indicators: Aching;Discomfort Pain Onset: With Activity Pain Intervention(s): Emotional support;Repositioned ADL: See Function Navigator for Current Functional Status.   Therapy/Group: Individual Therapy  Taos Tapp OTR/L 01/07/2018, 9:17 AM

## 2018-01-07 NOTE — Plan of Care (Signed)
  Problem: Consults Goal: RH GENERAL PATIENT EDUCATION Description See Patient Education module for education specifics. Outcome: Progressing Goal: Skin Care Protocol Initiated - if Braden Score 18 or less Description If consults are not indicated, leave blank or document N/A Outcome: Progressing   Problem: RH SKIN INTEGRITY Goal: RH STG SKIN FREE OF INFECTION/BREAKDOWN Description With mod assistance  Outcome: Progressing Goal: RH STG MAINTAIN SKIN INTEGRITY WITH ASSISTANCE Description STG Maintain Skin Integrity With mod Assistance.  Outcome: Progressing Goal: RH STG ABLE TO PERFORM INCISION/WOUND CARE W/ASSISTANCE Description STG Able To Perform Incision/Wound Care With max Assistance.  Outcome: Progressing   Problem: RH SAFETY Goal: RH STG ADHERE TO SAFETY PRECAUTIONS W/ASSISTANCE/DEVICE Description STG Adhere to Safety Precautions With min Assistance/Device.  Outcome: Progressing   Problem: RH PAIN MANAGEMENT Goal: RH STG PAIN MANAGED AT OR BELOW PT'S PAIN GOAL Description <2 out of 10 on a scale from 1-10  Outcome: Progressing   Problem: RH BOWEL ELIMINATION Goal: RH STG MANAGE BOWEL WITH ASSISTANCE Description STG Manage Bowel with mod Assistance.  Outcome: Not Progressing Goal: RH STG MANAGE BOWEL W/MEDICATION W/ASSISTANCE Description STG Manage Bowel with Medication with mod Assistance.  Outcome: Not Progressing

## 2018-01-07 NOTE — Progress Notes (Signed)
[facility] PHYSICAL MEDICINE & REHABILITATION     PROGRESS NOTE    Subjective/Complaints:  Pt states he remembers me from [facility] 3 yrs ago, No c/os today  ROS:neg CP, SOB, N/V/D  Objective:  No results found. No results for input(s): WBC, HGB, HCT, PLT in the last 72 hours. Recent Labs    01/06/18 0532  NA 140  K 3.8  CL 107  GLUCOSE 105*  BUN 28*  CREATININE 1.43*  CALCIUM 9.4   CBG (last 3)  No results for input(s): GLUCAP in the last 72 hours.  Wt Readings from Last 3 Encounters:  01/07/18 90.2 kg (198 lb 13.7 oz)  12/30/17 94.5 kg (208 lb 5.4 oz)  12/09/17 98.9 kg (218 lb)     Intake/Output Summary (Last 24 hours) at 01/07/2018 0815 Last data filed at 01/06/2018 2145 Gross per 24 hour  Intake 240 ml  Output 1 ml  Net 239 ml    Vital Signs: Blood pressure 140/63, pulse (!) 115, temperature 97.6 F (36.4 C), temperature source Oral, resp. rate 17, height 6\' 3"  (1.905 m), weight 90.2 kg (198 lb 13.7 oz), SpO2 96 %. Physical Exam:  Constitutional: No distress . Vital signs reviewed. HEENT: EOMI, oral membranes moist Neck: supple Cardiovascular: RRR without murmur. No JVD    Respiratory: CTA Bilaterally without wheezes or rales. Normal effort    GI: BS +, non-tender, non-distended  Musc: No edema or tenderness in extremities. Neurological:  Alert and oriented 2. remains more alert. Follows commands. .  Motor: B/l UE 4-/5 prox to distal.   B/l LE: 3+/5 proximal to distal  Psychiatric: pleasant and cooperative  Assessment/Plan: 1. Functional deficits secondary to urosepsis and subsequent encephalopathy and debility which require  interdisciplinary therapy in a comprehensive inpatient rehab setting. Physiatrist is providing close team supervision and 24 hour management of active medical problems listed below. Physiatrist and rehab team continue to assess barriers to discharge/monitor patient progress toward functional and medical  goals.  Function:  Bathing Bathing position   Position: Sitting EOB  Bathing parts Body parts bathed by patient: Right arm, Left arm, Abdomen, Front perineal area, Chest, Right upper leg, Left upper leg Body parts bathed by helper: Back, Left lower leg, Right lower leg, Buttocks  Bathing assist Assist Level: Touching or steadying assistance(Pt > 75%)      Upper Body Dressing/Undressing Upper body dressing   What is the patient wearing?: Pull over shirt/dress     Pull over shirt/dress - Perfomed by patient: Thread/unthread right sleeve, Thread/unthread left sleeve, Put head through opening Pull over shirt/dress - Perfomed by helper: Pull shirt over trunk        Upper body assist Assist Level: Supervision or verbal cues      Lower Body Dressing/Undressing Lower body dressing   What is the patient wearing?: Pants, Ted Hose     Pants- Performed by patient: Thread/unthread right pants leg, Thread/unthread left pants leg Pants- Performed by helper: Pull pants up/down Non-skid slipper socks- Performed by patient: Don/doff right sock Non-skid slipper socks- Performed by helper: Don/doff right sock, Don/doff left sock             TED Hose - Performed by patient: (pt dependent in all LB dressing per OT note) TED Hose - Performed by helper: Don/doff right TED hose, Don/doff left TED hose  Lower body assist Assist for lower body dressing: (total)      Toileting Toileting Toileting activity did not occur: No continent bowel/bladder event  Toileting steps completed by patient: Performs perineal hygiene Toileting steps completed by helper: Adjust clothing prior to toileting, Adjust clothing after toileting    Toileting assist Assist level: Two helpers   Transfers Chair/bed transfer   Chair/bed transfer method: Stand pivot Chair/bed transfer assist level: Moderate assist (Pt 50 - 74%/lift or lower) Chair/bed transfer assistive device: Mechanical lift Mechanical lift: Stedy    Locomotion Ambulation     Max distance: 5 Assist level: Moderate assist (Pt 50 - 74%)   Wheelchair   Type: Manual Max wheelchair distance: 189ft Assist Level: Supervision or verbal cues  Cognition Comprehension Comprehension assist level: Understands basic 75 - 89% of the time/ requires cueing 10 - 24% of the time  Expression Expression assist level: Expresses basic 75 - 89% of the time/requires cueing 10 - 24% of the time. Needs helper to occlude trach/needs to repeat words.  Social Interaction Social Interaction assist level: Interacts appropriately 75 - 89% of the time - Needs redirection for appropriate language or to initiate interaction., Interacts appropriately 50 - 74% of the time - May be physically or verbally inappropriate.  Problem Solving Problem solving assist level: Solves basic 50 - 74% of the time/requires cueing 25 - 49% of the time  Memory Memory assist level: Recognizes or recalls 25 - 49% of the time/requires cueing 50 - 75% of the time   Medical Problem List and Plan:  1. Decreased functional mobility secondary to encephalopathy due to urosepsis/multi-medical   Cont CIR PT, OT, SLP  -therapy schedule adjusted to better accommodate his activity tolerance 2. DVT Prophylaxis/Anticoagulation: Eliquis. Monitor for any bleeding episodes  3. Pain Management: Lidoderm patch, topicals  avoiding narcotics due to sedation and confusion  schedule tylenol 650mg  qid   Schedule kpad,   -team has modified therapy plan to allow restbreaks and bed time 4. Mood: Aricept 10 mg nightly, Cymbalta 30 mg nightly  5. Neuropsych: This patient is not capable of making decisions on his own behalf.  6. Skin/Wound Care: Stage I sacral pressure injury. Routine skin checks  7. Fluids/Electrolytes/Nutrition: Routine in and outs  Encourage PO 8. ID. IV Rocephin completed 12/29/2017.  9. V. tach cardiac arrest with history of PAF/pacemaker. Patient currently off Tikosyn until follow-up  outpatient with cardiology services.  10. Chronic systolic congestive heart failure. Monitor for any signs of fluid overload.   Lasix 40 mg   Filed Weights   01/05/18 0417 01/06/18 0436 01/07/18 0438  Weight: 89.8 kg (198 lb) 89.8 kg (198 lb) 90.2 kg (198 lb 13.7 oz)    -  Maintain same lasix dosing 11. Psoriatic arthritis. Methotrexate and chronic prednisone currently on hold.    -resumed medications: prednisone 5mg  daily and MTX 15mg  every Monday 12. Hypertension. Toprol-XL 25 mg daily. Monitor with increased mobility    Vitals:   01/06/18 1944 01/07/18 0438  BP: 136/62 140/63  Pulse: 64 (!) 115  Resp: 17 17  Temp: 98.6 F (37 C) 97.6 F (36.4 C)  SpO2: 100% 96%  Elevated HR early this am, BP and HR nl on exam this am 13. Hyperlipidemia. Lipitor  14. CKD stage III.   Creatinine 1.53 on 7/17----> 1.43 7/19- baseline 15. History of prostate cancer status post radiation. Patient on Flomax prior to admission 0.4 mg daily. Renal ultrasound no hydronephrosis. Follow-up outpatient Dr. Amalia Hailey   -consider condom cath at night to help with HS frequency/incontinence (chronic) 16. Acute on chronic anemia. Continue iron supplement.   Hemoglobin 9.5 on 7/17  No  outward signs of bleeding  Continue to monitor labs and clinical Eksir 17. Hypoalbuminemia  Supplement initiated 18. Leukocytosis  WBCs 13.5 on 7/15--- down to 12.0 on 7/17  Afebrile  UA -/equivocal, urine culture is negative  No active signs of infection   19.  Hx RIght parietal and BG infarcts- on eliquis LOS (Days) 8 A FACE TO FACE EVALUATION WAS PERFORMED  Charlett Blake, MD 01/07/2018 8:15 AM

## 2018-01-08 ENCOUNTER — Inpatient Hospital Stay (HOSPITAL_COMMUNITY): Payer: Self-pay

## 2018-01-08 ENCOUNTER — Inpatient Hospital Stay (HOSPITAL_COMMUNITY): Payer: Self-pay | Admitting: Physical Therapy

## 2018-01-08 ENCOUNTER — Encounter

## 2018-01-08 NOTE — Progress Notes (Signed)
Physical Therapy Session Note  Patient Details  Name: DIMAS SCHECK MRN: [id number] Date of Birth: 1934-02-07  Today's Date: 01/08/2018 PT Individual Time: 1105-1200 PT Individual Time Calculation (min): 55 min   Short Term Goals: Week 1:  PT Short Term Goal 1 (Week 1): Pt will perform squat pivot transfer bed to/from w/c with max A PT Short Term Goal 2 (Week 1): Pt will ambulate x 10 ft with LRAD and max A PT Short Term Goal 3 (Week 1): Pt will propel manual w/c x 50 ft with min A  Skilled Therapeutic Interventions/Progress Updates: Pt presented in w/c agreeable to therapy. Session focused on endurance with sitting and standing activities. Pt transported to rehab gym for energy conservation. Pt participated in standing/reaching by placing clothespins on basketball net. Pt was able to reach across midline to pick up clothespin and reach slightly outside BOS to place pin. Pt initially only able to tolerate approx 30 sec then requiring seated rest. After extended seated rest pt able to tolerate standing approx 1 min to complete task however unable to maintain B knee flexion. Required cues for diaphragmatic breathing once in seated for controlled recovery. Pt performed seated toe taps to 2in step for LE endurance 2 x 10. Pt then transported to day room and performed kinetron 90cm/sec first cycle 10 reps then 3 additional cycles at 20 reps for endurance with pt noting fair tolerance. Pt propelled w/c 13f with brief rests due to chest soreness. Pt returned to room and remained in w/c with belt alarm set, call bell within reach and needs met.      Therapy Documentation Precautions:  Precautions Precautions: Fall Precaution Comments: chest and ribs sore from CPR, chronic back pain Restrictions Weight Bearing Restrictions: No Other Position/Activity Restrictions:   General:   Vital Signs:  Pain:     See Function Navigator for Current Functional Status.   Therapy/Group: Individual  Therapy  Kloi Brodman  Georgann Bramble, PTA  01/08/2018, 4:06 PM

## 2018-01-08 NOTE — Progress Notes (Signed)
[facility] PHYSICAL MEDICINE & REHABILITATION     PROGRESS NOTE    Subjective/Complaints:  Pt c/o chronic low back pain , some relief with Lidoderm  ROS:neg CP, SOB, N/V/D  Objective:  No results found. No results for input(s): WBC, HGB, HCT, PLT in the last 72 hours. Recent Labs    01/06/18 0532  NA 140  K 3.8  CL 107  GLUCOSE 105*  BUN 28*  CREATININE 1.43*  CALCIUM 9.4   CBG (last 3)  No results for input(s): GLUCAP in the last 72 hours.  Wt Readings from Last 3 Encounters:  01/08/18 91 kg (200 lb 9.9 oz)  12/30/17 94.5 kg (208 lb 5.4 oz)  12/09/17 98.9 kg (218 lb)    No intake or output data in the 24 hours ending 01/08/18 0829  Vital Signs: Blood pressure (!) 131/57, pulse (!) 59, temperature 98.6 F (37 C), temperature source Oral, resp. rate 18, height 6\' 3"  (1.905 m), weight 91 kg (200 lb 9.9 oz), SpO2 95 %. Physical Exam:  Constitutional: No distress . Vital signs reviewed. HEENT: EOMI, oral membranes moist Neck: supple Cardiovascular: RRR without murmur. No JVD    Respiratory: CTA Bilaterally without wheezes or rales. Normal effort    GI: BS +, non-tender, non-distended  Musc: No edema or tenderness in extremities. Neurological:  Alert and oriented 2. remains more alert. Follows commands. .  Motor: B/l UE 4-/5 prox to distal.   B/l LE: 3+/5 proximal to distal  Psychiatric: pleasant and cooperative  Assessment/Plan: 1. Functional deficits secondary to urosepsis and subsequent encephalopathy and debility which require  interdisciplinary therapy in a comprehensive inpatient rehab setting. Physiatrist is providing close team supervision and 24 hour management of active medical problems listed below. Physiatrist and rehab team continue to assess barriers to discharge/monitor patient progress toward functional and medical goals.  Function:  Bathing Bathing position   Position: Sitting EOB  Bathing parts Body parts bathed by patient: Right arm,  Left arm, Abdomen, Front perineal area, Chest, Right upper leg, Left upper leg, Right lower leg, Left lower leg Body parts bathed by helper: Back  Bathing assist Assist Level: Touching or steadying assistance(Pt > 75%)      Upper Body Dressing/Undressing Upper body dressing   What is the patient wearing?: Pull over shirt/dress     Pull over shirt/dress - Perfomed by patient: Thread/unthread right sleeve, Thread/unthread left sleeve, Put head through opening, Pull shirt over trunk Pull over shirt/dress - Perfomed by helper: Pull shirt over trunk        Upper body assist Assist Level: Supervision or verbal cues      Lower Body Dressing/Undressing Lower body dressing   What is the patient wearing?: Pants, Ted Hose     Pants- Performed by patient: Thread/unthread right pants leg, Thread/unthread left pants leg, Pull pants up/down Pants- Performed by helper: Pull pants up/down Non-skid slipper socks- Performed by patient: Don/doff right sock Non-skid slipper socks- Performed by helper: Don/doff right sock, Don/doff left sock             TED Hose - Performed by patient: (pt dependent in all LB dressing per OT note) TED Hose - Performed by helper: Don/doff right TED hose, Don/doff left TED hose  Lower body assist Assist for lower body dressing: (total)      Toileting Toileting Toileting activity did not occur: No continent bowel/bladder event Toileting steps completed by patient: Performs perineal hygiene, Adjust clothing prior to toileting Toileting steps completed by  helper: Adjust clothing after toileting    Toileting assist Assist level: Two helpers   Transfers Chair/bed transfer   Chair/bed transfer method: Stand pivot Chair/bed transfer assist level: Moderate assist (Pt 50 - 74%/lift or lower) Chair/bed transfer assistive device: Mechanical lift Mechanical lift: Stedy   Locomotion Ambulation     Max distance: 13 Assist level: Moderate assist (Pt 50 - 74%)    Wheelchair   Type: Manual Max wheelchair distance: 130ft Assist Level: Supervision or verbal cues  Cognition Comprehension Comprehension assist level: Understands basic 75 - 89% of the time/ requires cueing 10 - 24% of the time  Expression Expression assist level: Expresses basic 75 - 89% of the time/requires cueing 10 - 24% of the time. Needs helper to occlude trach/needs to repeat words.  Social Interaction Social Interaction assist level: Interacts appropriately 75 - 89% of the time - Needs redirection for appropriate language or to initiate interaction., Interacts appropriately 50 - 74% of the time - May be physically or verbally inappropriate.  Problem Solving Problem solving assist level: Solves basic 50 - 74% of the time/requires cueing 25 - 49% of the time  Memory Memory assist level: Recognizes or recalls 25 - 49% of the time/requires cueing 50 - 75% of the time   Medical Problem List and Plan:  1. Decreased functional mobility secondary to encephalopathy due to urosepsis/multi-medical   Cont CIR PT, OT, SLP  -therapy schedule adjusted to better accommodate his activity tolerance 2. DVT Prophylaxis/Anticoagulation: Eliquis. Monitor for any bleeding episodes  3. Pain Management: Lidoderm patch, topicals Lumbar post laminectomy syndrome  avoiding narcotics due to sedation and confusion  schedule tylenol 650mg  qid   Schedule kpad,   -team has modified therapy plan to allow restbreaks and bed time 4. Mood: Aricept 10 mg nightly, Cymbalta 30 mg nightly  5. Neuropsych: This patient is not capable of making decisions on his own behalf.  6. Skin/Wound Care: Stage I sacral pressure injury. Routine skin checks  7. Fluids/Electrolytes/Nutrition: Routine in and outs  Encourage PO 8. ID. IV Rocephin completed 12/29/2017.  9. V. tach cardiac arrest with history of PAF/pacemaker. Patient currently off Tikosyn until follow-up outpatient with cardiology services.  10. Chronic systolic  congestive heart failure. Monitor for any signs of fluid overload.   Lasix 40 mg   Filed Weights   01/06/18 0436 01/07/18 0438 01/08/18 0440  Weight: 89.8 kg (198 lb) 90.2 kg (198 lb 13.7 oz) 91 kg (200 lb 9.9 oz)    -  Maintain same lasix dosing 11. Psoriatic arthritis. Methotrexate and chronic prednisone currently on hold.    -resumed medications: prednisone 5mg  daily and MTX 15mg  every Monday 12. Hypertension. Toprol-XL 25 mg daily. Monitor with increased mobility    Vitals:   01/07/18 1940 01/08/18 0440  BP: (!) 142/77 (!) 131/57  Pulse: 62 (!) 59  Resp: 18 18  Temp: 98.3 F (36.8 C) 98.6 F (37 C)  SpO2: 100% 95%  reasonable control today 13. Hyperlipidemia. Lipitor  14. CKD stage III.   Creatinine 1.53 on 7/17----> 1.43 7/19- baseline 15. History of prostate cancer status post radiation. Patient on Flomax prior to admission 0.4 mg daily. Renal ultrasound no hydronephrosis. Follow-up outpatient Dr. Amalia Hailey   -consider condom cath at night to help with HS frequency/incontinence (chronic) 16. Acute on chronic anemia. Continue iron supplement.   Hemoglobin 9.5 on 7/17  No outward signs of bleeding  Continue to monitor labs and clinical Eksir 17. Hypoalbuminemia  Supplement initiated 18. Leukocytosis  WBCs 13.5 on 7/15--- down to 12.0 on 7/17  Afebrile  UA -/equivocal, urine culture is negative  No active signs of infection   19.  Hx RIght parietal and BG infarcts- on eliquis LOS (Days) 9 A FACE TO FACE EVALUATION WAS PERFORMED  Charlett Blake, MD 01/08/2018 8:29 AM

## 2018-01-08 NOTE — Progress Notes (Signed)
Occupational Therapy Session Note  Patient Details  Name: GLENDELL FOUSE MRN: [id number] Date of Birth: 1933/12/06  Today's Date: 01/08/2018 OT Individual Time: 5520-8022 OT Individual Time Calculation (min): 71 min    Short Term Goals: Week 1:  OT Short Term Goal 1 (Week 1): Pt will sit to stand at sink or RW with MOD A OT Short Term Goal 1 - Progress (Week 1): Met OT Short Term Goal 2 (Week 1): Pt will transfer to [facility] with MAX A of 1 OT Short Term Goal 2 - Progress (Week 1): Met OT Short Term Goal 3 (Week 1): Pt will complete functional activity for 5 min wiht no more than 1 rest break to demonstrate improved activity tolerance OT Short Term Goal 3 - Progress (Week 1): Met OT Short Term Goal 4 (Week 1): Pt will don shirt wiht supervision OT Short Term Goal 4 - Progress (Week 1): Met  Skilled Therapeutic Interventions/Progress Updates:    1;1. Pain reported in back at 8/10. RN aware and delivers pain meds. Pt supine to sitting EOB with A for trunk elevation and Vc for use of bed rails. Pt completes stand pivot transfer to w/c with RW with MOD A for lifting and Vc for hand placement. Pt completes grooming at sink with set up and A to problem solve putting toothbrush into toothbrush holder. Pt propels w/c half way to gym with 2 rest breaks d/t decreased endurance.  Pt completes simulated LB dressing with reacher and theraband at sit to stand level with min-mod A for standing balance while advancing band past hips. Pt completes standing horse shoe toss 3x6 horse shoes reaching min-mod ranges outside BOS with min A for sit to stand/balance with RW. Pt requires prolonged seated rest breaks d/t decreased endurance and VC for knee/cervical extension in standing. Exited session with pt seated in w/c, QRB donned, call light in reach and all needs met  Therapy Documentation Precautions:  Precautions Precautions: Fall Precaution Comments: chest and ribs sore from CPR, chronic back  pain Restrictions Weight Bearing Restrictions: No Other Position/Activity Restrictions:    See Function Navigator for Current Functional Status.   Therapy/Group: Individual Therapy  Tonny Branch 01/08/2018, 8:47 AM

## 2018-01-09 ENCOUNTER — Inpatient Hospital Stay (HOSPITAL_COMMUNITY): Payer: Self-pay | Admitting: Physical Therapy

## 2018-01-09 ENCOUNTER — Inpatient Hospital Stay (HOSPITAL_COMMUNITY): Payer: Self-pay | Admitting: Speech Pathology

## 2018-01-09 ENCOUNTER — Ambulatory Visit: Payer: Self-pay | Admitting: Rehabilitation

## 2018-01-09 ENCOUNTER — Inpatient Hospital Stay (HOSPITAL_COMMUNITY): Payer: Self-pay | Admitting: Occupational Therapy

## 2018-01-09 DIAGNOSIS — M961 Postlaminectomy syndrome, not elsewhere classified: Secondary | ICD-10-CM

## 2018-01-09 LAB — BASIC METABOLIC PANEL
Anion gap: 10 (ref 5–15)
BUN: 24 mg/dL — AB (ref 8–23)
CHLORIDE: 107 mmol/L (ref 98–111)
CO2: 27 mmol/L (ref 22–32)
CREATININE: 1.45 mg/dL — AB (ref 0.61–1.24)
Calcium: 9.7 mg/dL (ref 8.9–10.3)
GFR, EST AFRICAN AMERICAN: 50 mL/min — AB (ref >60)
GFR, EST NON AFRICAN AMERICAN: 43 mL/min — AB (ref >60)
Glucose, Bld: 107 mg/dL — ABNORMAL HIGH (ref 70–99)
POTASSIUM: 3.7 mmol/L (ref 3.5–5.1)
SODIUM: 144 mmol/L (ref 135–145)

## 2018-01-09 NOTE — Progress Notes (Signed)
Pt declined use of home CPAP for the night. Machine at bedside

## 2018-01-09 NOTE — Progress Notes (Signed)
Physical Therapy Session Note  Patient Details  Name: Christopher [facility] MRN: [id number] Date of Birth: 11-12-1933  Today's Date: 01/09/2018 PT Individual Time: 8677-3736 PT Individual Time Calculation (min): 62 min   Short Term Goals: Week 1:  PT Short Term Goal 1 (Week 1): Pt will perform squat pivot transfer bed to/from w/c with max A PT Short Term Goal 2 (Week 1): Pt will ambulate x 10 ft with LRAD and max A PT Short Term Goal 3 (Week 1): Pt will propel manual w/c x 50 ft with min A  Skilled Therapeutic Interventions/Progress Updates: Pt presented in bed with wife present agreeable to therapy. Pt without lidocane patch today and verbalized increased pain 8/10, nsg aware. Pt's woife voiced concern as took measurements and noted w/c too wide for bathroom. PTA indicated current goals for pt is to be able to ambulate household distances and d/c goal is 28f, also adv currently ambulating distances approx 10 ft. Pt agreeable to continue with ambulation this session. Pt performed supine to sit via log roll technique with modA for sequencing and truncal support. PTA donned shoes total assist. Stand pivot transfer to w/c modA and pt transported to rehab gym for energy conservation. Pt performed x 2 bouts of ambulation 160fand 3771fith extended seated rest between for recovery. Pt performed w/c propulsion x [id number] BUE strengthening and endurance and performed step up to scale per pt's request (189.7lb). Pt then indicating bowl urgency, returned to w/c and transported to room. Performed sit to stand with Stedy for time management and transferred to toilet (+BM). PTA performed LB clothing management total assist and peri-care total assist. Pt transported to EOB via Stedy and returned to supine with modA for BLE management. [facility] with PTA stabilizing feet and pt using BUE on bed rail. Pt left in bed at end of session wife wife present, call bell within reach and needs met.       Therapy Documentation Precautions:  Precautions Precautions: Fall Precaution Comments: chest and ribs sore from CPR, chronic back pain Restrictions Weight Bearing Restrictions: No Other Position/Activity Restrictions:   General:   Vital Signs: Therapy Vitals Pulse Rate: (!) 59 Resp: 18 BP: (!) 112/51 Patient Position (if appropriate): Lying Oxygen Therapy SpO2: 97 % O2 Device: Room Air   See Function Navigator for Current Functional Status.   Therapy/Group: Individual Therapy  Thai Hemrick  Arda Keadle, PTA  01/09/2018, 3:42 PM

## 2018-01-09 NOTE — Plan of Care (Signed)
  Problem: Consults Goal: RH GENERAL PATIENT EDUCATION Description See Patient Education module for education specifics. Outcome: Progressing Goal: Skin Care Protocol Initiated - if Braden Score 18 or less Description If consults are not indicated, leave blank or document N/A Outcome: Progressing   Problem: RH BOWEL ELIMINATION Goal: RH STG MANAGE BOWEL WITH ASSISTANCE Description STG Manage Bowel with mod Assistance.  Outcome: Progressing Goal: RH STG MANAGE BOWEL W/MEDICATION W/ASSISTANCE Description STG Manage Bowel with Medication with mod Assistance.  Outcome: Progressing   Problem: RH SKIN INTEGRITY Goal: RH STG ABLE TO PERFORM INCISION/WOUND CARE W/ASSISTANCE Description STG Able To Perform Incision/Wound Care With max Assistance.  Outcome: Progressing   Problem: RH SAFETY Goal: RH STG ADHERE TO SAFETY PRECAUTIONS W/ASSISTANCE/DEVICE Description STG Adhere to Safety Precautions With min Assistance/Device.  Outcome: Progressing   Problem: RH PAIN MANAGEMENT Goal: RH STG PAIN MANAGED AT OR BELOW PT'S PAIN GOAL Description <2 out of 10 on a scale from 1-10  Outcome: Progressing   Problem: RH SKIN INTEGRITY Goal: RH STG SKIN FREE OF INFECTION/BREAKDOWN Description With mod assistance  Outcome: Not Progressing Goal: RH STG MAINTAIN SKIN INTEGRITY WITH ASSISTANCE Description STG Maintain Skin Integrity With mod Assistance.  Outcome: Not Progressing

## 2018-01-09 NOTE — Progress Notes (Signed)
[place] PHYSICAL MEDICINE & REHABILITATION     PROGRESS NOTE    Subjective/Complaints: Patient seen  Sitting up at the edge of his bed this morning working with therapies.he states he slept well overnight. He states he had a good weekend. He complains of back pain.  ROS: + back pain. Denies CP, SOB, nausea, vomiting, diarrhea.  Objective:  No results found. No results for input(s): WBC, HGB, HCT, PLT in the last 72 hours. Recent Labs    01/09/18 0627  NA 144  K 3.7  CL 107  GLUCOSE 107*  BUN 24*  CREATININE 1.45*  CALCIUM 9.7   CBG (last 3)  No results for input(s): GLUCAP in the last 72 hours.  Wt Readings from Last 3 Encounters:  01/09/18 91.2 kg (201 lb 1 oz)  12/30/17 94.5 kg (208 lb 5.4 oz)  12/09/17 98.9 kg (218 lb)     Intake/Output Summary (Last 24 hours) at 01/09/2018 1035 Last data filed at 01/08/2018 1625 Gross per 24 hour  Intake -  Output 26 ml  Net -26 ml    Vital Signs: Blood pressure 126/68, pulse 70, temperature 99.1 F (37.3 C), temperature source Oral, resp. rate 16, height 6\' 3"  (1.905 m), weight 91.2 kg (201 lb 1 oz), SpO2 96 %. Physical Exam:  Constitutional: No distress . Vital signs reviewed. HENT: Normocephalic.  Atraumatic. Eyes: EOMI. No discharge. Cardiovascular: RRR. No JVD. Respiratory: CTA Bilaterally. Normal effort. GI: BS +. Non-distended. Musc: No edema or tenderness in extremities. Neurological:  Alert and oriented 2 with confusion Motor: B/l UE 4/5 prox to distal.   B/l LE: 4-/5 proximal to distal  Psychiatric: pleasant and cooperative  Assessment/Plan: 1. Functional deficits secondary to urosepsis and subsequent encephalopathy and debility which require  interdisciplinary therapy in a comprehensive inpatient rehab setting. Physiatrist is providing close team supervision and 24 hour management of active medical problems listed below. Physiatrist and rehab team continue to assess barriers to discharge/monitor patient  progress toward functional and medical goals.  Function:  Bathing Bathing position   Position: Sitting EOB  Bathing parts Body parts bathed by patient: Right arm, Left arm, Abdomen, Front perineal area, Chest, Right upper leg, Left upper leg, Right lower leg, Left lower leg Body parts bathed by helper: Back  Bathing assist Assist Level: Touching or steadying assistance(Pt > 75%)      Upper Body Dressing/Undressing Upper body dressing   What is the patient wearing?: Pull over shirt/dress     Pull over shirt/dress - Perfomed by patient: Thread/unthread right sleeve, Thread/unthread left sleeve, Put head through opening, Pull shirt over trunk Pull over shirt/dress - Perfomed by helper: Pull shirt over trunk        Upper body assist Assist Level: Supervision or verbal cues      Lower Body Dressing/Undressing Lower body dressing   What is the patient wearing?: Pants, Ted Hose     Pants- Performed by patient: Thread/unthread right pants leg, Thread/unthread left pants leg, Pull pants up/down Pants- Performed by helper: Pull pants up/down Non-skid slipper socks- Performed by patient: Don/doff right sock Non-skid slipper socks- Performed by helper: Don/doff right sock, Don/doff left sock             TED Hose - Performed by patient: (pt dependent in all LB dressing per OT note) TED Hose - Performed by helper: Don/doff right TED hose, Don/doff left TED hose  Lower body assist Assist for lower body dressing: (total)      Toileting  Toileting Toileting activity did not occur: No continent bowel/bladder event Toileting steps completed by patient: Performs perineal hygiene, Adjust clothing prior to toileting Toileting steps completed by helper: Adjust clothing prior to toileting, Performs perineal hygiene Toileting Assistive Devices: Other (comment)(stedy)  Toileting assist Assist level: Two helpers   Transfers Chair/bed transfer   Chair/bed transfer method: Stand  pivot Chair/bed transfer assist level: Moderate assist (Pt 50 - 74%/lift or lower) Chair/bed transfer assistive device: Mechanical lift Mechanical lift: Stedy   Locomotion Ambulation     Max distance: 13 Assist level: Moderate assist (Pt 50 - 74%)   Wheelchair   Type: Manual Max wheelchair distance: 151ft Assist Level: Supervision or verbal cues  Cognition Comprehension Comprehension assist level: Understands basic 75 - 89% of the time/ requires cueing 10 - 24% of the time  Expression Expression assist level: Expresses basic 75 - 89% of the time/requires cueing 10 - 24% of the time. Needs helper to occlude trach/needs to repeat words.  Social Interaction Social Interaction assist level: Interacts appropriately 75 - 89% of the time - Needs redirection for appropriate language or to initiate interaction., Interacts appropriately 50 - 74% of the time - May be physically or verbally inappropriate.  Problem Solving Problem solving assist level: Solves basic 50 - 74% of the time/requires cueing 25 - 49% of the time  Memory Memory assist level: Recognizes or recalls 25 - 49% of the time/requires cueing 50 - 75% of the time   Medical Problem List and Plan:  1. Decreased functional mobility secondary to encephalopathy due to urosepsis/multi-medical   Cont CIR   -therapy schedule adjusted to better accommodate his activity tolerance 2. DVT Prophylaxis/Anticoagulation: Eliquis. Monitor for any bleeding episodes  3. Pain Management: Lidoderm patch, topicals  Lumbar post laminectomy syndrome  avoiding narcotics due to sedation and confusion  schedule tylenol 650mg  qid   Schedule kpad,   -team has modified therapy plan to allow restbreaks and bed time 4. Mood: Aricept 10 mg nightly, Cymbalta 30 mg nightly  5. Neuropsych: This patient is not capable of making decisions on his own behalf.  6. Skin/Wound Care: Stage I sacral pressure injury. Routine skin checks  7. Fluids/Electrolytes/Nutrition:  Routine in and outs  Encourage PO 8. ID. IV Rocephin completed 12/29/2017.  9. V. tach cardiac arrest with history of PAF/pacemaker. Patient currently off Tikosyn until follow-up outpatient with cardiology services.  10. Chronic systolic congestive heart failure. Monitor for any signs of fluid overload.   Lasix 40 mg   Filed Weights   01/07/18 0438 01/08/18 0440 01/09/18 0524  Weight: 90.2 kg (198 lb 13.7 oz) 91 kg (200 lb 9.9 oz) 91.2 kg (201 lb 1 oz)    Stable on 7/22 11. Psoriatic arthritis. Methotrexate and chronic prednisone currently on hold.    Resumed medications: prednisone 5mg  daily and MTX 15mg  every Monday 12. Hypertension. Toprol-XL 25 mg daily. Monitor with increased mobility    Vitals:   01/08/18 1949 01/09/18 0524  BP: (!) 142/55 126/68  Pulse: 63 70  Resp: 18 16  Temp: 98.6 F (37 C) 99.1 F (37.3 C)  SpO2: 99% 96%   Relatively controlled on 7/22 13. Hyperlipidemia. Lipitor  14. CKD stage III.   Creatinine 1.45 on 7/22   continue to monitor 15. History of prostate cancer status post radiation. Patient on Flomax prior to admission 0.4 mg daily. Renal ultrasound no hydronephrosis. Follow-up outpatient Dr. Amalia Hailey  16. Acute on chronic anemia. Continue iron supplement.   Hemoglobin 9.5 on  7/17  No outward signs of bleeding  Continue to monitor labs and clinical Eksir 17. Hypoalbuminemia  Supplement initiated 18. Leukocytosis  WBCs 12.0 on 7/17  Afebrile  UA -/equivocal, urine culture is negative  No active signs of infection  19.  Hx RIght parietal and BG infarcts- on eliquis   LOS (Days) 10 A FACE TO FACE EVALUATION WAS PERFORMED  Collyn Selk Lorie Phenix, MD 01/09/2018 10:35 AM

## 2018-01-09 NOTE — Progress Notes (Signed)
Occupational Therapy Session Note  Patient Details  Name: Christopher Baker MRN: [id number] Date of Birth: 09/10/1933  Today's Date: 01/09/2018 OT Individual Time: 0805-0901 OT Individual Time Calculation (min): 56 min    Short Term Goals: Week 2:  OT Short Term Goal 1 (Week 2): Pt will complete toilet transfers with min assist using the RW stand pivot. OT Short Term Goal 2 (Week 2): Pt will complete LB bathing sit to stand with min assist for two consecutive sessions.  OT Short Term Goal 3 (Week 2): Pt will complete LB dressing with AE sit to stand with min assist for two consecutive sessions.  OT Short Term Goal 4 (Week 2): Pt will complete walk-in shower transfers with min assist using the RW to tub seat.  Skilled Therapeutic Interventions/Progress Updates:    Pt completed bathing and dressing during session.  Mod assist for transition to sitting and then mod facilitation for use of the RW to ambulate to the shower.  Moderate bilateral increased knee flexion noted bilaterally with decreased step length, especially in the LLE.  Pt barely made it to the shower bench before needing to sit.  Min assist for most bathing sit to stand with max assist for washing peri area.  Pt reported needing to toilet after shower so grab bars and mod assist utilized to transition over to the toilet.  Stedy used for transfer back to the bed where he completed UB dressing with supervision and LB dressing with max assist sit to stand.  Pt left in bed to rest for next session.   Therapy Documentation Precautions:  Precautions Precautions: Fall Precaution Comments: chest and ribs sore from CPR, chronic back pain Restrictions Weight Bearing Restrictions: No Other Position/Activity Restrictions:     Pain: Pain Assessment Pain Scale: Faces Pain Score: 4 (working with therapy) Faces Pain Scale: Hurts little more Pain Type: Chronic pain Pain Location: Back Pain Descriptors / Indicators: Aching;Discomfort Pain  Onset: With Activity Pain Intervention(s): Repositioned;Emotional support Multiple Pain Sites: No ADL: See Function Navigator for Current Functional Status.   Therapy/Group: Individual Therapy  Javar Eshbach OTR/L 01/09/2018, 10:50 AM

## 2018-01-09 NOTE — Progress Notes (Signed)
Speech Language Pathology Daily Session Note  Patient Details  Name: Christopher Baker MRN: [id number] Date of Birth: 1934-03-24  Today's Date: 01/09/2018 SLP Individual Time: 3790-2409 SLP Individual Time Calculation (min): 30 min  Short Term Goals: Week 2: SLP Short Term Goal 1 (Week 2): Pt will use external aids to facilitate recall of daily information with min assist.   SLP Short Term Goal 2 (Week 2): Pt will use environmental cues to facilitate orientation to date and situation with min assist.  SLP Short Term Goal 3 (Week 2): Pt will complete mildly complex tasks with min assist for functional problem solving.   SLP Short Term Goal 4 (Week 2): Pt will sustain his attention to functional tasks for 15 min intervals with min cues for redirection.    Skilled Therapeutic Interventions: Skilled treatment session focused on cognition goals. SLP received pt asleep in bed. Pt required more than a reasonable amount of time to arouse and then he required Max A cues to maintain arousal. Schedule and orientation information reviewed with pt but unable to complete any further structured tasks d/t fatigue. Pt was left supine in bed, bed alarm on and all needs within reach. Continue per current plan of care.      Function:    Cognition Comprehension Comprehension assist level: Understands basic 75 - 89% of the time/ requires cueing 10 - 24% of the time  Expression   Expression assist level: Expresses basic 75 - 89% of the time/requires cueing 10 - 24% of the time. Needs helper to occlude trach/needs to repeat words.  Social Interaction Social Interaction assist level: Interacts appropriately 75 - 89% of the time - Needs redirection for appropriate language or to initiate interaction.  Problem Solving Problem solving assist level: Solves basic 50 - 74% of the time/requires cueing 25 - 49% of the time  Memory Memory assist level: Recognizes or recalls 25 - 49% of the time/requires cueing 50 - 75% of the  time    Pain    Therapy/Group: Individual Therapy  Rusty Glodowski 01/09/2018, 1:47 PM

## 2018-01-10 ENCOUNTER — Inpatient Hospital Stay (HOSPITAL_COMMUNITY): Payer: Self-pay | Admitting: Speech Pathology

## 2018-01-10 ENCOUNTER — Inpatient Hospital Stay (HOSPITAL_COMMUNITY): Payer: Self-pay | Admitting: Physical Therapy

## 2018-01-10 ENCOUNTER — Inpatient Hospital Stay (HOSPITAL_COMMUNITY): Payer: Self-pay | Admitting: Occupational Therapy

## 2018-01-10 DIAGNOSIS — R0989 Other specified symptoms and signs involving the circulatory and respiratory systems: Secondary | ICD-10-CM

## 2018-01-10 NOTE — Progress Notes (Signed)
CPAP at bedside- pt has been declining use.

## 2018-01-10 NOTE — Progress Notes (Signed)
[place] PHYSICAL MEDICINE & REHABILITATION     PROGRESS NOTE    Subjective/Complaints: Patient seen lying in bed this morning about to work with therapies. He states he slept well overnight until about 4 AM when he started to have back pain.  ROS: +back pain. Denies CP, SOB, nausea, vomiting, diarrhea.  Objective:  No results found. No results for input(s): WBC, HGB, HCT, PLT in the last 72 hours. Recent Labs    01/09/18 0627  NA 144  K 3.7  CL 107  GLUCOSE 107*  BUN 24*  CREATININE 1.45*  CALCIUM 9.7   CBG (last 3)  No results for input(s): GLUCAP in the last 72 hours.  Wt Readings from Last 3 Encounters:  01/10/18 91.3 kg (201 lb 5.3 oz)  12/30/17 94.5 kg (208 lb 5.4 oz)  12/09/17 98.9 kg (218 lb)     Intake/Output Summary (Last 24 hours) at 01/10/2018 0955 Last data filed at 01/10/2018 0847 Gross per 24 hour  Intake 386 ml  Output -  Net 386 ml    Vital Signs: Blood pressure (!) 122/59, pulse 60, temperature (!) 97.5 F (36.4 C), temperature source Oral, resp. rate 18, height 6\' 3"  (1.905 m), weight 91.3 kg (201 lb 5.3 oz), SpO2 100 %. Physical Exam:  Constitutional: No distress . Vital signs reviewed. HENT: Normocephalic.  Atraumatic. Eyes: EOMI. No discharge. Cardiovascular: RRR. No JVD. Respiratory: CTA bilaterally. Normal effort. GI: BS +. Non-distended. Musc: No edema or tenderness in extremities. Neurological:  Alert and oriented 2 with confusion, slight improvement Motor: B/l UE 4/5 prox to distal.   B/l LE: 4-/5 proximal to distal (limited due to pain today) Psychiatric: pleasant and cooperative  Assessment/Plan: 1. Functional deficits secondary to urosepsis and subsequent encephalopathy and debility which require  interdisciplinary therapy in a comprehensive inpatient rehab setting. Physiatrist is providing close team supervision and 24 hour management of active medical problems listed below. Physiatrist and rehab team continue to assess  barriers to discharge/monitor patient progress toward functional and medical goals.  Function:  Bathing Bathing position   Position: Sitting EOB  Bathing parts Body parts bathed by patient: Right arm, Left arm, Abdomen, Front perineal area, Chest, Right upper leg, Left upper leg Body parts bathed by helper: Buttocks, Back, Left lower leg, Right lower leg  Bathing assist Assist Level: Touching or steadying assistance(Pt > 75%)      Upper Body Dressing/Undressing Upper body dressing   What is the patient wearing?: Pull over shirt/dress     Pull over shirt/dress - Perfomed by patient: Thread/unthread right sleeve, Thread/unthread left sleeve, Put head through opening, Pull shirt over trunk Pull over shirt/dress - Perfomed by helper: Pull shirt over trunk        Upper body assist Assist Level: Supervision or verbal cues      Lower Body Dressing/Undressing Lower body dressing   What is the patient wearing?: Pants, Ted Hose, Underwear Underwear - Performed by patient: Pull underwear up/down Underwear - Performed by helper: Thread/unthread right underwear leg, Thread/unthread left underwear leg Pants- Performed by patient: Thread/unthread right pants leg, Pull pants up/down Pants- Performed by helper: Thread/unthread left pants leg Non-skid slipper socks- Performed by patient: Don/doff right sock Non-skid slipper socks- Performed by helper: Don/doff right sock, Don/doff left sock             TED Hose - Performed by patient: Don/doff right TED hose, Don/doff left TED hose TED Hose - Performed by helper: Don/doff right TED hose, Don/doff left  TED hose  Lower body assist Assist for lower body dressing: (total)      Toileting Toileting Toileting activity did not occur: No continent bowel/bladder event Toileting steps completed by patient: Performs perineal hygiene Toileting steps completed by helper: Adjust clothing after toileting, Adjust clothing prior to toileting Toileting  Assistive Devices: Other (comment)(stedy)  Toileting assist Assist level: Two helpers   Transfers Chair/bed transfer Chair/bed transfer activity did not occur: N/A Chair/bed transfer method: Stand pivot Chair/bed transfer assist level: Moderate assist (Pt 50 - 74%/lift or lower) Chair/bed transfer assistive device: Mechanical lift Mechanical lift: Ecologist     Max distance: 33ft Assist level: Touching or steadying assistance (Pt > 75%)   Wheelchair   Type: Manual Max wheelchair distance: 126ft Assist Level: Supervision or verbal cues  Cognition Comprehension Comprehension assist level: Understands basic 75 - 89% of the time/ requires cueing 10 - 24% of the time  Expression Expression assist level: Expresses basic 75 - 89% of the time/requires cueing 10 - 24% of the time. Needs helper to occlude trach/needs to repeat words.  Social Interaction Social Interaction assist level: Interacts appropriately 75 - 89% of the time - Needs redirection for appropriate language or to initiate interaction.  Problem Solving Problem solving assist level: Solves basic 50 - 74% of the time/requires cueing 25 - 49% of the time  Memory Memory assist level: Recognizes or recalls 25 - 49% of the time/requires cueing 50 - 75% of the time   Medical Problem List and Plan:  1. Decreased functional mobility secondary to encephalopathy due to urosepsis/multi-medical   Cont CIR   -therapy schedule adjusted to better accommodate his activity tolerance 2. DVT Prophylaxis/Anticoagulation: Eliquis. Monitor for any bleeding episodes  3. Pain Management: Lidoderm patch, topicals  Lumbar post laminectomy syndrome  avoiding narcotics due to sedation and confusion  schedule tylenol 650mg  qid   Kpad orderd  -team has modified therapy plan to allow restbreaks and bed time 4. Mood: Aricept 10 mg nightly, Cymbalta 30 mg nightly  5. Neuropsych: This patient is not capable of making decisions on his  own behalf.  6. Skin/Wound Care: Stage I sacral pressure injury. Routine skin checks  7. Fluids/Electrolytes/Nutrition: Routine in and outs  Encourage PO 8. ID. IV Rocephin completed 12/29/2017.  9. V. tach cardiac arrest with history of PAF/pacemaker. Patient currently off Tikosyn until follow-up outpatient with cardiology services.  10. Chronic systolic congestive heart failure. Monitor for any signs of fluid overload.   Lasix 40 mg   Filed Weights   01/08/18 0440 01/09/18 0524 01/10/18 0512  Weight: 91 kg (200 lb 9.9 oz) 91.2 kg (201 lb 1 oz) 91.3 kg (201 lb 5.3 oz)    Stable on 7/23 11. Psoriatic arthritis. Methotrexate and chronic prednisone currently on hold.    Resumed medications: prednisone 5mg  daily and MTX 15mg  every Monday 12. Hypertension. Toprol-XL 25 mg daily. Monitor with increased mobility    Vitals:   01/09/18 1936 01/10/18 0512  BP: (!) 149/72 (!) 122/59  Pulse: 60 60  Resp: 18 18  Temp: 98.7 F (37.1 C) (!) 97.5 F (36.4 C)  SpO2: 100% 100%   Labile on 7/23 13. Hyperlipidemia. Lipitor  14. CKD stage III.   Creatinine 1.45 on 7/22   continue to monitor 15. History of prostate cancer status post radiation. Patient on Flomax prior to admission 0.4 mg daily. Renal ultrasound no hydronephrosis. Follow-up outpatient Dr. Amalia Hailey  16. Acute on chronic anemia. Continue iron supplement.  Hemoglobin 9.5 on 7/17  No outward signs of bleeding  Will order labs later this week  Continue to monitor labs and clinical Eksir 17. Hypoalbuminemia  Supplement initiated 18. Leukocytosis  WBCs 12.0 on 7/17  Will order labs later this week  Afebrile  UA -/equivocal, urine culture is negative  No active signs of infection  19.  Hx RIght parietal and BG infarcts- on eliquis   LOS (Days) 11 A FACE TO FACE EVALUATION WAS PERFORMED  Navaeh Kehres Lorie Phenix, MD 01/10/2018 9:55 AM

## 2018-01-10 NOTE — Progress Notes (Signed)
Speech Language Pathology Daily Session Note  Patient Details  Name: Christopher Baker MRN: [id number] Date of Birth: 1934-02-05  Today's Date: 01/10/2018 SLP Individual Time: 1310-1410 SLP Individual Time Calculation (min): 60 min  Short Term Goals: Week 2: SLP Short Term Goal 1 (Week 2): Pt will use external aids to facilitate recall of daily information with min assist.   SLP Short Term Goal 2 (Week 2): Pt will use environmental cues to facilitate orientation to date and situation with min assist.  SLP Short Term Goal 3 (Week 2): Pt will complete mildly complex tasks with min assist for functional problem solving.   SLP Short Term Goal 4 (Week 2): Pt will sustain his attention to functional tasks for 15 min intervals with min cues for redirection.    Skilled Therapeutic Interventions:  Pt was seen for skilled ST targeting cognitive goals.  Pt was sitting up in recliner upon therapist's arrival, awake, alert, and agreeable to participate in therapy.  Pt had not eaten his lunch tray due to not liking hospital's food but was agreeable to eating some fruit from home and a small container of yogurt.  Pt reported that he thought he might need to use the restroom prior to leaving the room and was transferred to commode using Stedy lift.  No success with voiding or having a bowel movement.  SLP facilitated the session with a novel card game targeting problem solving goals.  Pt initially needed max assist cues to plan and execute a problem solving strategy due to decreased working memory of task rules and procedures; however, as task progressed therapist was able to fade cues to min assist for problem solving.  Pt was returned to room and transferred back to bed with heat applied to lower back due to reports of 6/10 back pain.  Continue per current plan of care.     Function:  Eating Eating                 Cognition Comprehension Comprehension assist level: Follows basic conversation/direction  with extra time/assistive device  Expression   Expression assist level: Expresses basic needs/ideas: With extra time/assistive device  Social Interaction Social Interaction assist level: Interacts appropriately 90% of the time - Needs monitoring or encouragement for participation or interaction.  Problem Solving Problem solving assist level: Solves basic 50 - 74% of the time/requires cueing 25 - 49% of the time  Memory Memory assist level: Recognizes or recalls 50 - 74% of the time/requires cueing 25 - 49% of the time    Pain Pain Assessment Pain Scale: 0-10 Pain Score: 6  Pain Type: Chronic pain Pain Location: Back Pain Orientation: Lower Pain Descriptors / Indicators: Aching Pain Onset: On-going Pain Intervention(s): Repositioned;Heat applied  Therapy/Group: Individual Therapy  Miata Culbreth, Selinda Orion 01/10/2018, 3:46 PM

## 2018-01-10 NOTE — Progress Notes (Signed)
Occupational Therapy Session Note  Patient Details  Name: Christopher Baker MRN: [id number] Date of Birth: 10/27/1933  Today's Date: 01/10/2018 OT Individual Time: 7673-4193 OT Individual Time Calculation (min): 57 min    Short Term Goals: Week 2:  OT Short Term Goal 1 (Week 2): Pt will complete toilet transfers with min assist using the RW stand pivot. OT Short Term Goal 2 (Week 2): Pt will complete LB bathing sit to stand with min assist for two consecutive sessions.  OT Short Term Goal 3 (Week 2): Pt will complete LB dressing with AE sit to stand with min assist for two consecutive sessions.  OT Short Term Goal 4 (Week 2): Pt will complete walk-in shower transfers with min assist using the RW to tub seat.  Skilled Therapeutic Interventions/Progress Updates:    Pt completed bathing and dressing sit to stand at the sink.  He was able to complete stand pivot transfer with the RW with min assist to the wheelchair once sitting EOB.  He completed all UB bathing and dressing with supervision and increased rest breaks secondary to back pain.  Provided LH sponge and reacher for washing and drying lower legs as well.  He was able to donn brief and pants over his feet with supervision and increased time using the reacher.  Min assist for sit to stand to pull them up over his hips.  He transferred to supine with mod assist to complete session.  Pt oriented to place as well as month and day of month, but not day of week or year.  Pt left with call button and phone in reach and bed alarm turned on.  Better knee extension noted in standing this session.   Therapy Documentation Precautions:  Precautions Precautions: Fall Precaution Comments: chest and ribs sore from CPR, chronic back pain Restrictions Weight Bearing Restrictions: No Other Position/Activity Restrictions:    Pain: Pain Assessment Pain Scale: Faces Pain Score: Asleep Pain Type: Chronic pain Pain Location: Back Pain Orientation:  Lower Pain Descriptors / Indicators: Aching Pain Onset: With Activity Pain Intervention(s): Repositioned ADL: See Function Navigator for Current Functional Status.   Therapy/Group: Individual Therapy  Izaac Reisig OTR/L 01/10/2018, 10:22 AM

## 2018-01-10 NOTE — Progress Notes (Signed)
Physical Therapy Session Note  Patient Details  Name: Christopher Baker MRN: [id number] Date of Birth: 28-Apr-1934  Today's Date: 01/10/2018 PT Individual Time: 1100-1200 PT Individual Time Calculation (min): 60 min   Short Term Goals: Week 1:  PT Short Term Goal 1 (Week 1): Pt will perform squat pivot transfer bed to/from w/c with max A PT Short Term Goal 2 (Week 1): Pt will ambulate x 10 ft with LRAD and max A PT Short Term Goal 3 (Week 1): Pt will propel manual w/c x 50 ft with min A  Skilled Therapeutic Interventions/Progress Updates: Pt presented in bed agreeable to therapy. Pt c/o pain 7/10 back nsg notified. Pt performed supine to sit with bed features and modA with multimodal cues for sequencing. Performed stand pivot with RW to w/c modA with x 2 attempts to get to standing. Pt propelled approx 144ft with x 4 rest breaks for BUE strengthening and endurance. Pt required extended rest break due to increased back pain with w/c propulsion. Pt participated in seated LE therex including LAQ, hip flexion, hip abd/add, ball squeezes, ankle pumps, HS curls with L2 resistance band, hip ER with level 2 resistance band. Pt agreeable to ambulation, performed sit to stand with minA and ambulated 52ft with RW and min guard. Cues for increasing BOS and step length with good carryover. Pt transported remaining distance to room. Pt agreeable to try sitting in recliner for short bout. Pt ambulatory transfer to recliner requiring minA for sit to stand and verbal cues for hand placement when turning to recliner. Pt positioned for comfort and left with chair alarm on, call bell within reach and NT present setting up lunch tray.      Therapy Documentation Precautions:  Precautions Precautions: Fall Precaution Comments: chest and ribs sore from CPR, chronic back pain Restrictions Weight Bearing Restrictions: No Other Position/Activity Restrictions:   General:   Vital Signs:  Pain: Pain Assessment Pain  Scale: 0-10 Pain Score: 7  Pain Type: Chronic pain Pain Location: Back Pain Orientation: Lower Pain Descriptors / Indicators: Aching Pain Onset: On-going Pain Intervention(s): Medication (See eMAR);Heat applied   See Function Navigator for Current Functional Status.   Therapy/Group: Individual Therapy  Shemiah Rosch  Nolan Lasser, PTA  01/10/2018, 12:20 PM

## 2018-01-11 ENCOUNTER — Inpatient Hospital Stay (HOSPITAL_COMMUNITY): Payer: Self-pay | Admitting: Physical Therapy

## 2018-01-11 ENCOUNTER — Inpatient Hospital Stay (HOSPITAL_COMMUNITY): Payer: Self-pay | Admitting: Speech Pathology

## 2018-01-11 ENCOUNTER — Encounter: Payer: Self-pay | Admitting: *Deleted

## 2018-01-11 ENCOUNTER — Inpatient Hospital Stay (HOSPITAL_COMMUNITY): Payer: Self-pay | Admitting: Occupational Therapy

## 2018-01-11 NOTE — Progress Notes (Signed)
[place] PHYSICAL MEDICINE & REHABILITATION     PROGRESS NOTE    Subjective/Complaints: Patient seen lying in bed this morning. He states he slept well overnight. He notes significant improvement with the K pad.  ROS: denies CP, SOB, nausea, vomiting, diarrhea.  Objective:  No results found. No results for input(s): WBC, HGB, HCT, PLT in the last 72 hours. Recent Labs    01/09/18 0627  NA 144  K 3.7  CL 107  GLUCOSE 107*  BUN 24*  CREATININE 1.45*  CALCIUM 9.7   CBG (last 3)  No results for input(s): GLUCAP in the last 72 hours.  Wt Readings from Last 3 Encounters:  01/11/18 91.3 kg (201 lb 4.5 oz)  12/30/17 94.5 kg (208 lb 5.4 oz)  12/09/17 98.9 kg (218 lb)     Intake/Output Summary (Last 24 hours) at 01/11/2018 1136 Last data filed at 01/11/2018 0900 Gross per 24 hour  Intake 540 ml  Output -  Net 540 ml    Vital Signs: Blood pressure (!) 130/52, pulse 60, temperature 98 F (36.7 C), temperature source Oral, resp. rate 18, height 6\' 3"  (1.905 m), weight 91.3 kg (201 lb 4.5 oz), SpO2 100 %. Physical Exam:  Constitutional: No distress . Vital signs reviewed. HENT: Normocephalic.  Atraumatic. Eyes: EOMI. No discharge. Cardiovascular: RRR. No JVD. Respiratory: CTA bilaterally. Normal effort. GI: BS +. Non-distended. Musc: No edema or tenderness in extremities. Neurological:  Alert and oriented 2 with confusion, slight improvement Motor: B/l UE 4/5 prox to distal.  (stable) B/l LE: 4-/5 proximal to distal (stable) Psychiatric: pleasant and cooperative  Assessment/Plan: 1. Functional deficits secondary to urosepsis and subsequent encephalopathy and debility which require  interdisciplinary therapy in a comprehensive inpatient rehab setting. Physiatrist is providing close team supervision and 24 hour management of active medical problems listed below. Physiatrist and rehab team continue to assess barriers to discharge/monitor patient progress toward  functional and medical goals.  Function:  Bathing Bathing position   Position: Wheelchair/chair at sink  Bathing parts Body parts bathed by patient: Right arm, Left arm, Abdomen, Front perineal area, Chest, Right upper leg, Left upper leg, Right lower leg, Left lower leg Body parts bathed by helper: Back, Buttocks  Bathing assist Assist Level: Touching or steadying assistance(Pt > 75%)      Upper Body Dressing/Undressing Upper body dressing   What is the patient wearing?: Pull over shirt/dress     Pull over shirt/dress - Perfomed by patient: Thread/unthread right sleeve, Thread/unthread left sleeve, Put head through opening, Pull shirt over trunk Pull over shirt/dress - Perfomed by helper: Pull shirt over trunk        Upper body assist Assist Level: Supervision or verbal cues      Lower Body Dressing/Undressing Lower body dressing   What is the patient wearing?: Pants, Ted Hose, Underwear Underwear - Performed by patient: Thread/unthread right underwear leg, Thread/unthread left underwear leg, Pull underwear up/down Underwear - Performed by helper: Thread/unthread right underwear leg, Thread/unthread left underwear leg Pants- Performed by patient: Thread/unthread right pants leg, Thread/unthread left pants leg, Pull pants up/down Pants- Performed by helper: Thread/unthread left pants leg Non-skid slipper socks- Performed by patient: Don/doff right sock Non-skid slipper socks- Performed by helper: Don/doff right sock, Don/doff left sock             TED Hose - Performed by patient: Don/doff right TED hose, Don/doff left TED hose TED Hose - Performed by helper: Don/doff right TED hose, Don/doff left TED hose  Lower body assist Assist for lower body dressing: (total)      Toileting Toileting Toileting activity did not occur: No continent bowel/bladder event Toileting steps completed by patient: Performs perineal hygiene Toileting steps completed by helper: Adjust clothing  after toileting, Adjust clothing prior to toileting Toileting Assistive Devices: Other (comment)(stedy)  Toileting assist Assist level: Two helpers   Transfers Chair/bed transfer Chair/bed transfer activity did not occur: N/A Chair/bed transfer method: Stand pivot Chair/bed transfer assist level: Touching or steadying assistance (Pt > 75%) Chair/bed transfer assistive device: Environmental manager lift: Ecologist     Max distance: 32ft Assist level: Touching or steadying assistance (Pt > 75%)   Wheelchair   Type: Manual Max wheelchair distance: 128ft Assist Level: Supervision or verbal cues  Cognition Comprehension Comprehension assist level: Follows basic conversation/direction with extra time/assistive device  Expression Expression assist level: Expresses basic needs/ideas: With extra time/assistive device  Social Interaction Social Interaction assist level: Interacts appropriately 90% of the time - Needs monitoring or encouragement for participation or interaction.  Problem Solving Problem solving assist level: Solves basic 75 - 89% of the time/requires cueing 10 - 24% of the time  Memory Memory assist level: Recognizes or recalls 50 - 74% of the time/requires cueing 25 - 49% of the time   Medical Problem List and Plan:  1. Decreased functional mobility secondary to encephalopathy due to urosepsis/multi-medical   Cont CIR   -therapy schedule adjusted to better accommodate his activity tolerance 2. DVT Prophylaxis/Anticoagulation: Eliquis. Monitor for any bleeding episodes  3. Pain Management: Lidoderm patch, topicals  Lumbar post laminectomy syndrome  avoiding narcotics due to sedation and confusion  schedule tylenol 650mg  qid   Kpad ordered with improvement  -team has modified therapy plan to allow restbreaks and bed time 4. Mood: Aricept 10 mg nightly, Cymbalta 30 mg nightly  5. Neuropsych: This patient is not capable of making decisions on his own behalf.   6. Skin/Wound Care: Stage I sacral pressure injury. Routine skin checks  7. Fluids/Electrolytes/Nutrition: Routine in and outs  Encourage PO 8. ID. IV Rocephin completed 12/29/2017.  9. V. tach cardiac arrest with history of PAF/pacemaker. Patient currently off Tikosyn until follow-up outpatient with cardiology services.  10. Chronic systolic congestive heart failure. Monitor for any signs of fluid overload.   Lasix 40 mg   Filed Weights   01/09/18 0524 01/10/18 0512 01/11/18 0422  Weight: 91.2 kg (201 lb 1 oz) 91.3 kg (201 lb 5.3 oz) 91.3 kg (201 lb 4.5 oz)    Stable on 7/23 11. Psoriatic arthritis. Methotrexate and chronic prednisone currently on hold.    Resumed medications: prednisone 5mg  daily and MTX 15mg  every Monday 12. Hypertension. Toprol-XL 25 mg daily. Monitor with increased mobility    Vitals:   01/10/18 2235 01/11/18 0422  BP: 138/72 (!) 130/52  Pulse: 62 60  Resp:  18  Temp:  98 F (36.7 C)  SpO2:  100%   Relatively controlled on 7/24 13. Hyperlipidemia. Lipitor  14. CKD stage III.   Creatinine 1.45 on 7/22   continue to monitor 15. History of prostate cancer status post radiation. Patient on Flomax prior to admission 0.4 mg daily. Renal ultrasound no hydronephrosis. Follow-up outpatient Dr. Amalia Hailey  16. Acute on chronic anemia. Continue iron supplement.   Hemoglobin 9.5 on 7/17  Labs ordered for tomorrow 17. Hypoalbuminemia  Supplement initiated 18. Leukocytosis  WBCs 12.0 on 7/17  Labs ordered for tomorrow  Afebrile  UA -Hinda Lenis, urine culture  is negative  No active signs of infection  19.  Hx RIght parietal and BG infarcts- on eliquis   LOS (Days) 12 A FACE TO FACE EVALUATION WAS PERFORMED  Adie Vilar Lorie Phenix, MD 01/11/2018 11:36 AM

## 2018-01-11 NOTE — Progress Notes (Signed)
Physical Therapy Session Note  Patient Details  Name: Christopher Baker MRN: [id number] Date of Birth: 1934/01/29  Today's Date: 01/11/2018 PT Individual Time: 1100-1200 PT Individual Time Calculation (min): 60 min   Short Term Goals: Week 2: STG's= LTG's  Skilled Therapeutic Interventions/Progress Updates: Pt presented in recliner agreeable to therapy. Per pt feeling more fatigued as has been sitting up all morning. Pt performed ambulatory transfer requiring minA for standing and walked across room to w/c. Pt transported to day room for energy conservation. Pt performed stand pivot to NuStep and perfromed L1 x 3 min and x 5 min for strengthening and endurance. Pt noted a level 9 after 3 min and 11 after 5 min on BORG exertion scale. Performed stand pivot to w/c minA and transported to day room. Attempted to play checkers in standing for standing tolerance activities however pt only able to tolerate standing for approx x 1 min then requesting to sit due to increased pain. Pt then indicating urgency to use toilet. Pt transported to room for time management and performed ambulatory transfer to toilet with w/c. Pt noted to continue to have poor L foot clearance with gait (-void/BM). Due to fatigue pt performed Stedy transfer to recliner and set up for lunch with needs met.        Therapy Documentation Precautions:  Precautions Precautions: Fall Precaution Comments: chest and ribs sore from CPR, chronic back pain Restrictions Weight Bearing Restrictions: No Other Position/Activity Restrictions:   General:   Vital Signs: Therapy Vitals Temp: 97.7 F (36.5 C) Temp Source: Oral Pulse Rate: 63 Resp: 16 BP: (!) 136/54 Patient Position (if appropriate): Lying Oxygen Therapy SpO2: 97 % O2 Device: Room Air Pain: Pain Assessment Pain Score: 6  See Function Navigator for Current Functional Status.   Therapy/Group: Individual Therapy  Benetta Maclaren  Amarilis Belflower, PTA    01/11/2018,  4:41 PM

## 2018-01-11 NOTE — Consult Note (Addendum)
   [facility] CM Inpatient Consult   01/11/2018  Christopher Baker 09-22-1933 [id number]    Received notification from [facility] NP on behalf of HTA, to outreach Christopher Baker for [place] Management services while on inpatient rehab unit.   Went to bedside to speak with Christopher Baker and his wife Christopher Baker. Explained [facility] Care Management program services and [facility] Care Management written consent obtained. [facility] Care Management folder provided along with contact information.  Explained [facility] Care Management program will not interfere or replace services provided by home health.  Confirmed Primary Care MD is Dr. Osborne Casco ([place] listed as doing transition of care calls). Confirmed that Christopher Baker (wife) should be contacted for post discharge calls at (cell) [phone] or (home) [phone]. Christopher Baker is agreeable to this. No concerns voiced for transportation or medication needs.  Christopher Baker indicates they could benefit from additional [facility] social work support in [place] the application process for long term care insurance. Christopher Baker reports she attempted to contact the company to inquire about benefits. However, she was given little information. States she was told to contact the company post discharge for further inquiries.   Discussed [facility] RNCM follow up for chronic disease management. Christopher Baker has a medical history of paroxysmal A. Fib, pacemaker, CHF, mild dementia, HTN, HLD, CVA, prostate cancer. Has chronic back pain per patient and wife report. Christopher Baker also reports he has not had an appetite. Encouraged both patient and wife to make inpatient staff aware of his poor appetite concerns.  Christopher Baker expresses appreciation of bedside visit and states she looks forward to [place] Management assistance post discharge.  Made patient's nurse aware of Christopher Baker's concern about his appetite.   Sent notification to inpatient rehab LCSW to make aware [facility] will follow post  discharge.  Will make referral to [place] and [place] worker. Update sent to [facility] NP as well.   Marthenia Rolling, MSN-Ed, RN,BSN [facility] Liaison [phone]

## 2018-01-11 NOTE — Progress Notes (Signed)
Spoke with patient about wearing CPAP tonight patient states he don't want to wear tonight.

## 2018-01-11 NOTE — Progress Notes (Signed)
Occupational Therapy Session Note  Patient Details  Name: Christopher Baker MRN: [id number] Date of Birth: 1934/04/27  Today's Date: 01/11/2018 OT Individual Time: 1300-1355 OT Individual Time Calculation (min): 55 min   Skilled Therapeutic Interventions/Progress Updates:    1:1 Focus on sit to stand, standing balance, activity tolerance/ endurance, and overall strengthening. Pt able to perform sit to stands throughout session with supervision to min A with extra time and cues for placement only 2 times. Functional ambulation from window to sink with min A with cues for safety when turning to sit in w/c. Pt propelled with bilateral LEs ~125 feet. In hallway continued to practice sit to stands and functional ambulation with turns. Also performed side stepping while sliding a block down the hallway ~25 feet with min A with Bilateral UE support. Pt demonstrated difficulty with stabilizing and sustained terminal hip/ knee extension due to weakness on the right.  Also performed toe tapping onto 3 inche step for endurance and balance with weight shifts. Pt requires prolonged rest break between all activities. Pt required mod A to return to supine in bed.   Therapy Documentation Precautions:  Precautions Precautions: Fall Precaution Comments: chest and ribs sore from CPR, chronic back pain Restrictions Weight Bearing Restrictions: No Other Position/Activity Restrictions:   Pain: No c/o pain just fatigue See Function Navigator for Current Functional Status.   Therapy/Group: Individual Therapy  Willeen Cass [facility] 01/11/2018, 2:19 PM

## 2018-01-11 NOTE — Progress Notes (Signed)
Speech Language Pathology Daily Session Note  Patient Details  Name: Christopher Baker MRN: [id number] Date of Birth: 01/06/1934  Today's Date: 01/11/2018 SLP Individual Time: 0805-0900 SLP Individual Time Calculation (min): 55 min  Short Term Goals: Week 2: SLP Short Term Goal 1 (Week 2): Pt will use external aids to facilitate recall of daily information with min assist.   SLP Short Term Goal 2 (Week 2): Pt will use environmental cues to facilitate orientation to date and situation with min assist.  SLP Short Term Goal 3 (Week 2): Pt will complete mildly complex tasks with min assist for functional problem solving.   SLP Short Term Goal 4 (Week 2): Pt will sustain his attention to functional tasks for 15 min intervals with min cues for redirection.    Skilled Therapeutic Interventions:  Pt was seen for skilled ST targeting cognitive goals.  Pt was in bed upon arrival, awake, alert, and agreeable to getting out of bed for therapy.  Pt was transferred to wheelchair via [facility] lift after therapist assisted pt in donning TED hose, slipper socks, and pajama pants.  SLP facilitated the session with a novel card game targeting use of memory compensatory strategies, specifically word-picture associations.  Pt needed max to total cues to generate word-picture associations but could recall them after delays of varying length via spaced retrieval training with min assist.  Pt could then generate specific category members during naming portion of task with mod I and recalled named members in 11/12 opportunities with min verbal cues.  Pt was returned to room where he requested to brush his teeth.  Pt completed oral care and tray set up for breakfast with mod I for increased time.  Pt was transferred back to recliner and left with all needs within reach and chair alarm set.  Continue per current plan of care.   Function:  Eating Eating                 Cognition Comprehension Comprehension assist level:  Follows basic conversation/direction with extra time/assistive device  Expression   Expression assist level: Expresses basic needs/ideas: With extra time/assistive device  Social Interaction Social Interaction assist level: Interacts appropriately 90% of the time - Needs monitoring or encouragement for participation or interaction.  Problem Solving Problem solving assist level: Solves basic 75 - 89% of the time/requires cueing 10 - 24% of the time  Memory Memory assist level: Recognizes or recalls 50 - 74% of the time/requires cueing 25 - 49% of the time    Pain Pain Assessment Pain Scale: 0-10 Pain Score: 6  Pain Type: Chronic pain Pain Location: Back Pain Orientation: Lower Pain Descriptors / Indicators: Aching Pain Intervention(s): Repositioned;Heat applied  Therapy/Group: Individual Therapy  Kortney Potvin, Selinda Orion 01/11/2018, 10:13 AM

## 2018-01-11 NOTE — Plan of Care (Signed)
  Problem: RH SKIN INTEGRITY Goal: RH STG ABLE TO PERFORM INCISION/WOUND CARE W/ASSISTANCE Description STG Able To Perform Incision/Wound Care With max Assistance.  Outcome: Progressing  Administering barrier cream on bottom and assessing daily

## 2018-01-12 ENCOUNTER — Inpatient Hospital Stay (HOSPITAL_COMMUNITY): Payer: Self-pay | Admitting: Speech Pathology

## 2018-01-12 ENCOUNTER — Inpatient Hospital Stay (HOSPITAL_COMMUNITY): Payer: Self-pay

## 2018-01-12 ENCOUNTER — Inpatient Hospital Stay (HOSPITAL_COMMUNITY): Payer: Self-pay | Admitting: Occupational Therapy

## 2018-01-12 LAB — CBC WITH DIFFERENTIAL/PLATELET
Abs Immature Granulocytes: 0.1 10*3/uL (ref 0.0–0.1)
BASOS ABS: 0.1 10*3/uL (ref 0.0–0.1)
BASOS PCT: 1 %
EOS PCT: 1 %
Eosinophils Absolute: 0.1 10*3/uL (ref 0.0–0.7)
HCT: 32.4 % — ABNORMAL LOW (ref 39.0–52.0)
Hemoglobin: 9.5 g/dL — ABNORMAL LOW (ref 13.0–17.0)
Immature Granulocytes: 1 %
Lymphocytes Relative: 9 %
Lymphs Abs: 0.9 10*3/uL (ref 0.7–4.0)
MCH: 26.3 pg (ref 26.0–34.0)
MCHC: 29.3 g/dL — AB (ref 30.0–36.0)
MCV: 89.8 fL (ref 78.0–100.0)
Monocytes Absolute: 0.6 10*3/uL (ref 0.1–1.0)
Monocytes Relative: 6 %
Neutro Abs: 8.6 10*3/uL — ABNORMAL HIGH (ref 1.7–7.7)
Neutrophils Relative %: 82 %
PLATELETS: 394 10*3/uL (ref 150–400)
RBC: 3.61 MIL/uL — ABNORMAL LOW (ref 4.22–5.81)
RDW: 16 % — ABNORMAL HIGH (ref 11.5–15.5)
WBC: 10.4 10*3/uL (ref 4.0–10.5)

## 2018-01-12 NOTE — Progress Notes (Signed)
Occupational Therapy Session Note  Patient Details  Name: Christopher Baker MRN: [id number] Date of Birth: 02-28-1934  Today's Date: 01/12/2018 OT Individual Time: 0800-0902 OT Individual Time Calculation (min): 62 min    Short Term Goals: Week 2:  OT Short Term Goal 1 (Week 2): Pt will complete toilet transfers with min assist using the RW stand pivot. OT Short Term Goal 2 (Week 2): Pt will complete LB bathing sit to stand with min assist for two consecutive sessions.  OT Short Term Goal 3 (Week 2): Pt will complete LB dressing with AE sit to stand with min assist for two consecutive sessions.  OT Short Term Goal 4 (Week 2): Pt will complete walk-in shower transfers with min assist using the RW to tub seat.  Skilled Therapeutic Interventions/Progress Updates:    Pt completed bathing and dressing sit to stand during session.  Mod assist for sit to stand from the EOB with min assist to ambulate to the shower.  He completed most bathing with supervision for UB and mod assist for LB sit to stand.  He then transferred over to the toilet with min assist from the shower bench for toileting task. Min assist for sit to stand from the toilet and to ambulate out to the bedside recliner for dressing tasks.  Reacher utilized for threading pants over feet with min assist for sit to stand in order to pull them up over his hips.  Supervision for donning pullover shirt.  Therapist assisted with donning TEDs and gripper socks secondary to decreased time.  Pt left in recliner with safety alarm belt in place and call button and phone in reach, with pt working on self feeding of breakfast.   Therapy Documentation Precautions:  Precautions Precautions: Fall Precaution Comments: chest and ribs sore from CPR, chronic back pain Restrictions Weight Bearing Restrictions: No Other Position/Activity Restrictions:    Pain: Pain Assessment Pain Scale: Faces Faces Pain Scale: Hurts little more Pain Type: Chronic  pain Pain Location: Back Pain Orientation: Lower Pain Descriptors / Indicators: Discomfort Pain Onset: On-going Pain Intervention(s): Repositioned;Emotional support ADL: See Function Navigator for Current Functional Status.   Therapy/Group: Individual Therapy  Treyshaun Keatts OTR/L 01/12/2018, 12:15 PM

## 2018-01-12 NOTE — Progress Notes (Signed)
Speech Language Pathology Daily Session Note  Patient Details  Name: Christopher Baker MRN: [id number] Date of Birth: 1934/03/15  Today's Date: 01/12/2018 SLP Individual Time: 4462-8638 SLP Individual Time Calculation (min): 30 min  Short Term Goals: Week 2: SLP Short Term Goal 1 (Week 2): Pt will use external aids to facilitate recall of daily information with min assist.   SLP Short Term Goal 2 (Week 2): Pt will use environmental cues to facilitate orientation to date and situation with min assist.  SLP Short Term Goal 3 (Week 2): Pt will complete mildly complex tasks with min assist for functional problem solving.   SLP Short Term Goal 4 (Week 2): Pt will sustain his attention to functional tasks for 15 min intervals with min cues for redirection.    Skilled Therapeutic Interventions:  Pt was seen for skilled ST targeting cognitive goals.  Upon arrival, pt was in bed using call bell to request getting out of bed.  Pt demonstrated mild confusion with use of call bell and repeatedly kept pushing call button despite nursing secretary answering call several times.  Mod cues needed for proper use of call bell.  Pt was agreeable to getting up but had intense pain all over with attempts to sit at edge of bed.  Pt had increased anxiety with further attempts to get out of bed and eventually became resistant to getting up at all.  As a result, session was ended early.  Pt was left in bed and RN was made aware.   Continue per current plan of care.    Function:  Eating Eating                 Cognition Comprehension Comprehension assist level: Follows basic conversation/direction with extra time/assistive device  Expression   Expression assist level: Expresses basic needs/ideas: With extra time/assistive device  Social Interaction Social Interaction assist level: Interacts appropriately 90% of the time - Needs monitoring or encouragement for participation or interaction.  Problem Solving Problem  solving assist level: Solves basic 50 - 74% of the time/requires cueing 25 - 49% of the time  Memory Memory assist level: Recognizes or recalls 50 - 74% of the time/requires cueing 25 - 49% of the time    Pain Pain Assessment Pain Scale: Faces Pain Score: 2  Faces Pain Scale: Hurts worst Pain Type: Acute pain Pain Location: Generalized Pain Descriptors / Indicators: Aching Pain Intervention(s): Repositioned;RN made aware  Therapy/Group: Individual Therapy  Christopher Baker, Selinda Orion 01/12/2018, 9:02 PM

## 2018-01-12 NOTE — Patient Care Conference (Signed)
Inpatient RehabilitationTeam Conference and Plan of Care Update Date: 01/11/2018   Time: 2:25 PM    Patient Name: Christopher Baker      Medical Record Number: [id number]  Date of Birth: 1933/11/01 Sex: Male         Room/Bed: 4W23C/4W23C-01 Payor Info: Payor: [employer] / Plan: [employer] / Product Type: *No Product type* /    Admitting Diagnosis: debility  sepsis  Admit Date/Time:  12/30/2017  3:43 PM Admission Comments: No comment available   Primary Diagnosis:  <principal problem not specified> Principal Problem: <principal problem not specified>  Patient Active Problem List   Diagnosis Date Noted  . Labile blood pressure   . Postlaminectomy syndrome, lumbar region   . Anemia of chronic disease   . Dementia without behavioral disturbance   . Chronic systolic (congestive) heart failure ([place])   . Psoriatic arthritis ([place])   . Stage 3 chronic kidney disease ([place])   . Urinary frequency   . Acute blood loss anemia   . Hypoalbuminemia due to protein-calorie malnutrition ([place])   . Encephalopathy 12/30/2017  . Atrial fibrillation with rapid ventricular response ([place])   . Acute respiratory failure with hypoxemia ([place])   . Aspiration pneumonia of right upper lobe due to gastric secretions ([place])   . Drug-induced torsades de pointes 12/24/2017  . Sepsis ([place]) 12/23/2017  . Ventricular tachycardia ([place]) 12/23/2017  . Pressure injury of skin 12/23/2017  . E coli bacteremia 12/23/2017  . Urinary tract infection 12/23/2017  . Acute pulmonary edema ([place]) 12/23/2017  . Sepsis, unspecified organism ([place]) 10/30/2017  . Acute urinary retention 10/30/2017  . Chronic systolic congestive heart failure ([place]) 09/07/2017  . S/P shoulder replacement 06/27/2015  . Residual cognitive deficit as late effect of stroke 04/15/2015  . Lung nodule   . Weakness generalized 12/11/2014  . Leukocytosis 12/11/2014  . Restless leg syndrome 12/11/2014  . Normocytic anemia 12/11/2014  .  Abnormal EKG 12/11/2014  . Gait disturbance, post-stroke 10/22/2014  . Partial seizures ([place]) 06/18/2014  . Vitamin B12 deficiency 06/18/2014  . OSA (obstructive sleep apnea) 02/26/2014  . Cognitive deficits as late effect of cerebrovascular disease 01/11/2014  . Fatigue 09/20/2013  . Intermittent lightheadedness 06/19/2013  . Low blood pressure, not hypotension 06/19/2013  . Vascular dementia 04/09/2013  . History of completed stroke 04/03/2013  . Occlusion and stenosis of carotid artery without mention of cerebral infarction 11/21/2012  . OA (osteoarthritis) of knee 12/20/2011  . Methotrexate, long term, current use 09/10/2011  . Knee pain, left 07/27/2011  . Pacemaker-Medtronic 09/08/2010  . UNSPECIFIED VISUAL DISTURBANCE 07/20/2010  . IRON DEFIC ANEMIA [place] DIET IRON INTAKE 04/20/2010  . PSORIASIS 04/20/2010  . DISUSE OSTEOPOROSIS 05/12/2009  . SPONDYLOSIS, LUMBAR, WITH RADICULOPATHY 10/15/2008  . OTHER SPECIFIED DERMATOMYCOSES 06/24/2008  . VARICOSE VEINS LOWER EXTREMITIES W/INFLAMMATION 12/25/2007  . INSOMNIA WITH SLEEP APNEA UNSPECIFIED 08/31/2007  . GERD 03/23/2007  . Atrial fibrillation, chronic ([place]) 01/31/2007  . PSORIATIC ARTHRITIS 01/31/2007  . Hyperlipidemia 01/19/2007  . Essential hypertension 01/19/2007  . History of prostate cancer 01/19/2007    Expected Discharge Date: Expected Discharge Date: 01/18/18  Team Members Present: Physician leading conference: Dr. Delice Lesch Social Worker Present: Ovidio Kin, LCSW Nurse Present: Other (comment)(Denise Verlin Grills) PT Present: Phylliss Bob, PTA;Other (comment)(Taylor Turkalo-PT) OT Present: Willeen Cass, OT SLP Present: Windell Moulding, SLP PPS Coordinator present : Daiva Nakayama, RN, CRRN     Current Status/Progress Goal Weekly Team Focus  Medical   Decreased functional mobility secondary to encephalopathy  due to urosepsis/multi-medical   Improve mobility, back pain, WBCs  See above   Bowel/Bladder   (P)  Incontinent of bladder and bowel with period of continent episode noted encourage use of BSC and urinal. LBM 01/10/18 remain on prn meds  (P) Continue to encourage resident to toilet frequently   (P) Assess QS and provide incontient care as need, address toleting if no BM with in 3 days with schedule prn medications    Swallow/Nutrition/ Hydration             ADL's   Supervision for UB bathing and dressing, min assist for LB bathing and dressing with use of AE.  Min to mod assist for stand pivot transfers with use of the RW.    supervision level   selfcare retraining, transfer training, balance retraining, cognitive retraining, pt/family education, DME/AE education   Mobility   minA bed mobility, min/modA sit to stand transfers, CGA stand pivot transfers, gait CGA with RW up to 55ft  supervision transfers, supervision gait household distances, minA stairs and community ambulation  endurance, transfers, gait, stairs,    Communication             Safety/Cognition/ Behavioral Observations  improved alertness, min-mod assist  min assist-supervision, some LTG downgraded due to slow progress  needs family education prior to discharge, continue working on basic to mildly complex cognition    Pain   (P) pain denies pain throughout shift , continue schedle Tylenol Q6 hrs rate pain to back 5/10 verbalize relief after Tylenol  (P) assess for s/sx relating to pain q shift and PRN and Tx      Skin                *See Care Plan and progress notes for long and short-term goals.     Barriers to Discharge  Current Status/Progress Possible Resolutions Date Resolved   Physician    Medical stability     See above  Therapies, improve back pain, follow labs      Nursing                  PT                    OT                  SLP                SW                Discharge Planning/Teaching Needs:    Home with wife to assist with his care. She has been in for observation in therapies     Team  Discussion:  progressing toward his goals of supervision level-min assist with stairs. Endurance and fatigue is an issue. MD addressing back pain MD ordered k-pad. Skin issues-nursing addressing and monitoring. Speech needs to see wife for education prior to discharge.  Revisions to Treatment Plan:  DC 7/31    Continued Need for Acute Rehabilitation Level of Care: The patient requires daily medical management by a physician with specialized training in physical medicine and rehabilitation for the following conditions: Daily direction of a multidisciplinary physical rehabilitation program to ensure safe treatment while eliciting the highest outcome that is of practical value to the patient.: Yes Daily medical management of patient stability for increased activity during participation in an intensive rehabilitation regime.: Yes Daily analysis of laboratory values and/or radiology reports with any subsequent need  for medication adjustment of medical intervention for : Neurological problems;Other  Jarek Longton, Gardiner Rhyme 01/12/2018, 9:04 AM

## 2018-01-12 NOTE — Progress Notes (Signed)
[facility] PHYSICAL MEDICINE & REHABILITATION     PROGRESS NOTE    Subjective/Complaints: Patient seen lying in bed this morning. He states he slept well overnight. He appears more alert and oriented. He states he believes his cognition is improving.  ROS: denies CP, SOB, nausea, vomiting, diarrhea.  Objective:  No results found. Recent Labs    01/12/18 0457  WBC 10.4  HGB 9.5*  HCT 32.4*  PLT 394   No results for input(s): NA, K, CL, GLUCOSE, BUN, CREATININE, CALCIUM in the last 72 hours.  Invalid input(s): CO CBG (last 3)  No results for input(s): GLUCAP in the last 72 hours.  Wt Readings from Last 3 Encounters:  01/12/18 83.9 kg (185 lb)  12/30/17 94.5 kg (208 lb 5.4 oz)  12/09/17 98.9 kg (218 lb)     Intake/Output Summary (Last 24 hours) at 01/12/2018 1005 Last data filed at 01/12/2018 0830 Gross per 24 hour  Intake 560 ml  Output 127 ml  Net 433 ml    Vital Signs: Blood pressure 123/64, pulse (!) 52, temperature (!) 97.4 F (36.3 C), temperature source Oral, resp. rate 12, height 6\' 3"  (1.905 m), weight 83.9 kg (185 lb), SpO2 91 %. Physical Exam:  Constitutional: No distress . Vital signs reviewed. HENT: Normocephalic.  Atraumatic. Eyes: EOMI. No discharge. Cardiovascular: RRR. No JVD. Respiratory: CTA bilaterally. Normal effort. GI: BS +. Non-distended. Musc: No edema or tenderness in extremities. Neurological:  Alert and oriented 3, improving Motor: B/l UE 4/5 prox to distal.  (unchanged) B/l LE: 3+-4-/5 proximal to distal (left stronger than right) Psychiatric: pleasant and cooperative  Assessment/Plan: 1. Functional deficits secondary to urosepsis and subsequent encephalopathy and debility which require  interdisciplinary therapy in a comprehensive inpatient rehab setting. Physiatrist is providing close team supervision and 24 hour management of active medical problems listed below. Physiatrist and rehab team continue to assess barriers to  discharge/monitor patient progress toward functional and medical goals.  Function:  Bathing Bathing position   Position: Wheelchair/chair at sink  Bathing parts Body parts bathed by patient: Right arm, Left arm, Abdomen, Front perineal area, Chest, Right upper leg, Left upper leg, Right lower leg, Left lower leg Body parts bathed by helper: Back, Buttocks  Bathing assist Assist Level: Touching or steadying assistance(Pt > 75%)      Upper Body Dressing/Undressing Upper body dressing   What is the patient wearing?: Pull over shirt/dress     Pull over shirt/dress - Perfomed by patient: Thread/unthread right sleeve, Thread/unthread left sleeve, Put head through opening, Pull shirt over trunk Pull over shirt/dress - Perfomed by helper: Pull shirt over trunk        Upper body assist Assist Level: Supervision or verbal cues      Lower Body Dressing/Undressing Lower body dressing   What is the patient wearing?: Pants, Ted Hose, Underwear Underwear - Performed by patient: Thread/unthread right underwear leg, Thread/unthread left underwear leg, Pull underwear up/down Underwear - Performed by helper: Thread/unthread right underwear leg, Thread/unthread left underwear leg Pants- Performed by patient: Thread/unthread right pants leg, Thread/unthread left pants leg, Pull pants up/down Pants- Performed by helper: Thread/unthread left pants leg Non-skid slipper socks- Performed by patient: Don/doff right sock Non-skid slipper socks- Performed by helper: Don/doff right sock, Don/doff left sock             TED Hose - Performed by patient: Don/doff right TED hose, Don/doff left TED hose TED Hose - Performed by helper: Don/doff right TED hose, Don/doff left  TED hose  Lower body assist Assist for lower body dressing: (total)      Toileting Toileting Toileting activity did not occur: No continent bowel/bladder event Toileting steps completed by patient: Performs perineal hygiene Toileting  steps completed by helper: Adjust clothing after toileting, Adjust clothing prior to toileting Toileting Assistive Devices: Other (comment)(stedy)  Toileting assist Assist level: Two helpers   Transfers Chair/bed transfer Chair/bed transfer activity did not occur: N/A Chair/bed transfer method: Stand pivot Chair/bed transfer assist level: Touching or steadying assistance (Pt > 75%) Chair/bed transfer assistive device: Environmental manager lift: Ecologist     Max distance: 63ft Assist level: Touching or steadying assistance (Pt > 75%)   Wheelchair   Type: Manual Max wheelchair distance: 140ft Assist Level: Supervision or verbal cues  Cognition Comprehension Comprehension assist level: Follows basic conversation/direction with extra time/assistive device  Expression Expression assist level: Expresses basic needs/ideas: With extra time/assistive device  Social Interaction Social Interaction assist level: Interacts appropriately 90% of the time - Needs monitoring or encouragement for participation or interaction.  Problem Solving Problem solving assist level: Solves basic 75 - 89% of the time/requires cueing 10 - 24% of the time  Memory Memory assist level: Recognizes or recalls 50 - 74% of the time/requires cueing 25 - 49% of the time   Medical Problem List and Plan:  1. Decreased functional mobility secondary to encephalopathy due to urosepsis/multi-medical   Cont CIR   -therapy schedule adjusted to better accommodate his activity tolerance 2. DVT Prophylaxis/Anticoagulation: Eliquis. Monitor for any bleeding episodes  3. Pain Management: Lidoderm patch, topicals  Lumbar post laminectomy syndrome  avoiding narcotics due to sedation and confusion  schedule tylenol 650mg  qid   Kpad ordered with improvement  -team has modified therapy plan to allow restbreaks and bed time 4. Mood: Aricept 10 mg nightly, Cymbalta 30 mg nightly  5. Neuropsych: This patient is not  capable of making decisions on his own behalf.  6. Skin/Wound Care: Stage I sacral pressure injury. Routine skin checks  7. Fluids/Electrolytes/Nutrition: Routine in and outs  Encourage PO 8. ID. IV Rocephin completed 12/29/2017.  9. V. tach cardiac arrest with history of PAF/pacemaker. Patient currently off Tikosyn until follow-up outpatient with cardiology services.  10. Chronic systolic congestive heart failure. Monitor for any signs of fluid overload.   Lasix 40 mg   Filed Weights   01/10/18 0512 01/11/18 0422 01/12/18 0656  Weight: 91.3 kg (201 lb 5.3 oz) 91.3 kg (201 lb 4.5 oz) 83.9 kg (185 lb)    ? Reliability on 7/25 11. Psoriatic arthritis. Methotrexate and chronic prednisone currently on hold.    Resumed medications: prednisone 5mg  daily and MTX 15mg  every Monday 12. Hypertension. Toprol-XL 25 mg daily. Monitor with increased mobility    Vitals:   01/11/18 1929 01/12/18 0656  BP: 120/70 123/64  Pulse: 70 (!) 52  Resp: 20 12  Temp: 97.7 F (36.5 C) (!) 97.4 F (36.3 C)  SpO2: 98% 91%   Relatively controlled on 7/25 13. Hyperlipidemia. Lipitor  14. CKD stage III.   Creatinine 1.45 on 7/22   continue to monitor 15. History of prostate cancer status post radiation. Patient on Flomax prior to admission 0.4 mg daily. Renal ultrasound no hydronephrosis. Follow-up outpatient Dr. Amalia Hailey  16. Acute on chronic anemia. Continue iron supplement.   Hemoglobin 9.5 on 7/25 17. Hypoalbuminemia  Supplement initiated 18. Leukocytosis: Resolved  WBCs 10.4 on 7/25  Afebrile  UA -/equivocal, urine culture is negative  No active signs of infection  19.  Hx RIght parietal and BG infarcts- on eliquis   LOS (Days) 13 A FACE TO FACE EVALUATION WAS PERFORMED  Ankit Lorie Phenix, MD 01/12/2018 10:05 AM

## 2018-01-12 NOTE — Progress Notes (Signed)
Physical Therapy Session Note  Patient Details  Name: Christopher Baker MRN: [id number] Date of Birth: 01-11-1934  Today's Date: 01/12/2018 PT Individual Time: 1100-1200 PT Individual Time Calculation (min): 60 min   Short Term Goals: Week 1:  PT Short Term Goal 1 (Week 1): Pt will perform squat pivot transfer bed to/from w/c with max A PT Short Term Goal 1 - Progress (Week 1): Met PT Short Term Goal 2 (Week 1): Pt will ambulate x 10 ft with LRAD and max A PT Short Term Goal 2 - Progress (Week 1): Met PT Short Term Goal 3 (Week 1): Pt will propel manual w/c x 50 ft with min A PT Short Term Goal 3 - Progress (Week 1): Met Week 2:     Skilled Therapeutic Interventions/Progress Updates:   Pt asleep flat in bed, and slow to wake up.  Supine> sit with mod assist due to low back pain, rated 8/10.  Pt needed many adjustments fo bed features to limit back pain as he eventually sat up. Pt used bed control module with encouragement and mod assist.   Pt dizzy upon sitting up, but this resolved in 1 minute.  Attempted sit> stand from raised bed, with L hand on RW, R hand pushing up on bed.  Pt unable to tolerate back pain after standing x , 10 seconds, and sat down.  He stated he would be unable to sttand again, and declined attempting a slide board transfer.  Pt lay back down with mod assist.  Using bed features, pt able to scoot HOB with supervison.  He is unable to push with bil feet due to increased back pain.    R side lying: bil hip flexion in tolerable range, and L clam shell abductions x 12 each.  L side lyihng : R clam shell hip abductions x 12 L isolated hip extenson 2  x 10, bil hip flex/extension in tolerable range x 10.  With LE exs, pt began to c/o L great toe pain.  Slight rtythema noted L 1st metatarsophalangeal and DIP joints, and edema greater L foot than R foot. Pt informed MIranda, Therapist, sports.  Supine:cervical flexion, L/R short arc quad knee extension x 10 each, L/R active assistive straight  leg raise.  Pt left resting in bed with towel roll under knees for back pain relief, with needs at hand.        Therapy Documentation Precautions:  Precautions Precautions: Fall Precaution Comments: chest and ribs sore from CPR, chronic back pain Restrictions Weight Bearing Restrictions: No Other Position/Activity Restrictions:        See Function Navigator for Current Functional Status.   Therapy/Group: Individual Therapy  Kearie Mennen 01/12/2018, 12:06 PM

## 2018-01-13 ENCOUNTER — Inpatient Hospital Stay (HOSPITAL_COMMUNITY): Payer: Self-pay | Admitting: Speech Pathology

## 2018-01-13 ENCOUNTER — Inpatient Hospital Stay (HOSPITAL_COMMUNITY): Payer: Self-pay | Admitting: Occupational Therapy

## 2018-01-13 ENCOUNTER — Ambulatory Visit: Payer: Self-pay | Admitting: Rehabilitation

## 2018-01-13 ENCOUNTER — Inpatient Hospital Stay (HOSPITAL_COMMUNITY): Payer: Self-pay | Admitting: Physical Therapy

## 2018-01-13 NOTE — Progress Notes (Signed)
Speech Language Pathology Weekly Progress and Session Note  Patient Details  Name: Christopher Baker MRN: [id number] Date of Birth: 1934-03-20  Beginning of progress report period: January 06, 2018  End of progress report period: January 13, 2018   Today's Date: 01/13/2018 SLP Individual Time: 1027-2536 SLP Individual Time Calculation (min): 30 min  Short Term Goals: Week 2: SLP Short Term Goal 1 (Week 2): Pt will use external aids to facilitate recall of daily information with min assist.   SLP Short Term Goal 1 - Progress (Week 2): Progressing toward goal SLP Short Term Goal 2 (Week 2): Pt will use environmental cues to facilitate orientation to date and situation with min assist.  SLP Short Term Goal 2 - Progress (Week 2): Progressing toward goal SLP Short Term Goal 3 (Week 2): Pt will complete mildly complex tasks with min assist for functional problem solving.   SLP Short Term Goal 3 - Progress (Week 2): Progressing toward goal SLP Short Term Goal 4 (Week 2): Pt will sustain his attention to functional tasks for 15 min intervals with min cues for redirection.   SLP Short Term Goal 4 - Progress (Week 2): Met    New Short Term Goals: Week 3: SLP Short Term Goal 1 (Week 3): STG=LTG due to remaining length of stay  Weekly Progress Updates:   Pt has made limited, inconsistent gains this reporting period and has met 1 out of 4 short term goals.  Pt is currently max assist for tasks due to decreased toleration of therapies in the setting of fatigue and pain.  Earlier in the week, pt was demonstrating improved attention to task, problem solving, and recall of daily information; however pt has not been able to fully participate in therapy over the last two days due to pain. Family has not been present for training at this time and this will need to happen prior to discharge given pt's ongoing cognitive deficits that will impact his functional independence in the home environment.  Pt would continue to  benefit from skilled ST while inpatient in order to maximize functional independence and reduce burden of care prior to discharge.  Anticipate that pt will need 24/7 supervision at discharge in addition to [place] [facility] follow up at next level of care.      Intensity: Minumum of 1-2 x/day, 30 to 90 minutes Frequency: 3 to 5 out of 7 days Duration/Length of Stay: 7/31 Treatment/Interventions: Cognitive remediation/compensation;Cueing hierarchy;Functional tasks;Patient/family education;Internal/external aids;Environmental controls   Daily Session  Skilled Therapeutic Interventions: Pt was received with nurse tech at bedside, half in and half out of the bed.  Pt had apparently requested to transfer out of the bed but became limited by pain in the middle of the transfer.  Multiple attempts at different transfer methods were used but both nurse tech and therapist were unsuccessful due to pt's pain, anxiety, and subsequent resistance to assistance.  Pt attempted to direct his care to tell nurse tech and therapist ways that would be the least painful to get out of bed; however, pt was unable to clearly or even completely convey these ideas.  Ultimately, pt was transferred back to bed and left to rest.  RN made aware and administered Tylenol.  When therapist went back to check on pt ~10 minutes later, pt still had complaints of "unbearable" pain.  As a result, pt missed 30 min of ST.  Goals updated on this date to reflect current progress and plan of care.  Function:   Eating Eating                 Cognition Comprehension Comprehension assist level: Follows basic conversation/direction with extra time/assistive device  Expression   Expression assist level: Expresses basic needs/ideas: With extra time/assistive device  Social Interaction Social Interaction assist level: Interacts appropriately 90% of the time - Needs monitoring or encouragement for participation or interaction.  Problem Solving  Problem solving assist level: Solves basic 50 - 74% of the time/requires cueing 25 - 49% of the time  Memory Memory assist level: Recognizes or recalls 50 - 74% of the time/requires cueing 25 - 49% of the time   General    Pain Pain Assessment Pain Score: Asleep  Therapy/Group: Individual Therapy  Christopher Baker, Selinda Orion 01/13/2018, 4:16 PM

## 2018-01-13 NOTE — Progress Notes (Signed)
Occupational Therapy Weekly Progress Note  Patient Details  Name: JOEZIAH VOIT MRN: [id number] Date of Birth: 1933/10/27  Beginning of progress report period: January 07, 2018 End of progress report period: January 13, 2018  Today's Date: 01/13/2018 OT Individual Time: 5361-4431 OT Individual Time Calculation (min): 56 min    Patient has met 0 of 4 short term goals.  Mr. Goh continues to make slower progress than originally expected in OT sessions.  At times he has been able to complete functional transfers to the toilet as well as sit to stand with min assist, but most of the time he continues to need mod facilitation secondary to weakness and ongoing chronic back pain.  His therapy schedule was decreased previously and that has improved his participation level as well,  However, he still exhibits confusion with decreased selective attention at times, requiring increased time for initiation of tasks and for completion of them.  Increased knee flexion is still present in standing but is continuing to improve overall.  Recommend continued CIR level OT until expected discharge 7/31 with spouse who will provide 24 hour supervision.  Will schedule family education this week in anticipation of discharge home.    Patient continues to demonstrate the following deficits: muscle weakness, decreased initiation, decreased attention, decreased problem solving, decreased memory and delayed processing and decreased standing balance and decreased balance strategies and therefore will continue to benefit from skilled OT intervention to enhance overall performance with BADL and Reduce care partner burden.  Patient not progressing toward long term goals.  See goal revision..  Continue plan of care.  OT Short Term Goals Week 3:  OT Short Term Goal 1 (Week 3): Continue working on established LTGs set at supervision to min assist level.  Skilled Therapeutic Interventions/Progress Updates:    Pt began session with  transfer from supine to sit EOB with mod assist.  He then completed stand pivot transfer to the wheelchair with mod assist as well using the RW for support.  Pt had reported completing bathing task earlier with nursing so just worked on grooming tasks of brushing his hair and teeth as well as shaving from the wheelchair.  He then donned his pullover shirt with setup as well.  Pt then used the reacher to doff his socks so that the therapist could donn his TEDs.  He utilized the sockaide for donning his gripper socks.  He needed mod demonstrational cueing to complete this secondary to not remembering how to use the sockaide.  Finished session with transfer to the bedside recliner with min assist using the RW.  Call button and phone in reach with safety alarm belt in place as well.   Therapy Documentation Precautions:  Precautions Precautions: Fall Precaution Comments: chest and ribs sore from CPR, chronic back pain Restrictions Weight Bearing Restrictions: No Other Position/Activity Restrictions:    Pain: Pain Assessment Pain Scale: 0-10 Pain Score: 9  Pain Type: Chronic pain Pain Location: Back Pain Descriptors / Indicators: Discomfort Pain Onset: With Activity Pain Intervention(s): Repositioned;Emotional support ADL: See Function Navigator for Current Functional Status.   Therapy/Group: Individual Therapy  Markas Aldredge OTR/L 01/13/2018, 8:57 AM

## 2018-01-13 NOTE — Progress Notes (Signed)
Pt continues to refuse Cpap at this time.

## 2018-01-13 NOTE — Progress Notes (Signed)
[place] PHYSICAL MEDICINE & REHABILITATION     PROGRESS NOTE    Subjective/Complaints: Patient seen transferring from bed to chair this morning, working with therapies. He states he slept well overnight.  ROS:  Denies CP, SOB, nausea, vomiting, diarrhea.  Objective:  No results found. Recent Labs    01/12/18 0457  WBC 10.4  HGB 9.5*  HCT 32.4*  PLT 394   No results for input(s): NA, K, CL, GLUCOSE, BUN, CREATININE, CALCIUM in the last 72 hours.  Invalid input(s): CO CBG (last 3)  No results for input(s): GLUCAP in the last 72 hours.  Wt Readings from Last 3 Encounters:  01/13/18 84.4 kg (186 lb)  12/30/17 94.5 kg (208 lb 5.4 oz)  12/09/17 98.9 kg (218 lb)     Intake/Output Summary (Last 24 hours) at 01/13/2018 1037 Last data filed at 01/12/2018 1900 Gross per 24 hour  Intake 240 ml  Output 100 ml  Net 140 ml    Vital Signs: Blood pressure (!) 118/56, pulse (!) 56, temperature 97.8 F (36.6 C), temperature source Oral, resp. rate 18, height 6\' 3"  (1.905 m), weight 84.4 kg (186 lb), SpO2 100 %. Physical Exam:  Constitutional: No distress . Vital signs reviewed. HENT: Normocephalic.  Atraumatic. Eyes: EOMI. No discharge. Cardiovascular: RRR. No JVD. Respiratory: CTA bilaterally. Normal effort. GI: BS +. Non-distended. Musc: No edema or tenderness in extremities. Neurological:  Alert and oriented 2 Motor: B/l UE 4/5 prox to distal.  (unchanged) LLE: 4/5 proximal to distal  RLE: 4-/5 proximal to distal Psychiatric: pleasant and cooperative  Assessment/Plan: 1. Functional deficits secondary to urosepsis and subsequent encephalopathy and debility which require  interdisciplinary therapy in a comprehensive inpatient rehab setting. Physiatrist is providing close team supervision and 24 hour management of active medical problems listed below. Physiatrist and rehab team continue to assess barriers to discharge/monitor patient progress toward functional and  medical goals.  Function:  Bathing Bathing position   Position: Shower  Bathing parts Body parts bathed by patient: Right arm, Left arm, Chest, Abdomen, Front perineal area, Right upper leg, Left upper leg Body parts bathed by helper: Left lower leg, Right lower leg, Back, Buttocks  Bathing assist Assist Level: Touching or steadying assistance(Pt > 75%)      Upper Body Dressing/Undressing Upper body dressing   What is the patient wearing?: Pull over shirt/dress     Pull over shirt/dress - Perfomed by patient: Thread/unthread right sleeve, Thread/unthread left sleeve, Put head through opening, Pull shirt over trunk Pull over shirt/dress - Perfomed by helper: Pull shirt over trunk        Upper body assist Assist Level: Supervision or verbal cues      Lower Body Dressing/Undressing Lower body dressing   What is the patient wearing?: Pants, Ted Hose, Non-skid slipper socks Underwear - Performed by patient: Thread/unthread right underwear leg, Thread/unthread left underwear leg, Pull underwear up/down Underwear - Performed by helper: Thread/unthread right underwear leg, Thread/unthread left underwear leg Pants- Performed by patient: Thread/unthread right pants leg, Thread/unthread left pants leg, Pull pants up/down Pants- Performed by helper: Thread/unthread left pants leg Non-skid slipper socks- Performed by patient: Don/doff right sock Non-skid slipper socks- Performed by helper: Don/doff right sock, Don/doff left sock             TED Hose - Performed by patient: Don/doff right TED hose, Don/doff left TED hose TED Hose - Performed by helper: Don/doff right TED hose, Don/doff left TED hose  Lower body assist Assist for  lower body dressing: (total)      Toileting Toileting Toileting activity did not occur: No continent bowel/bladder event Toileting steps completed by patient: Performs perineal hygiene Toileting steps completed by helper: Adjust clothing after toileting,  Adjust clothing prior to toileting Toileting Assistive Devices: Other (comment)(stedy)  Toileting assist Assist level: Two helpers   Transfers Chair/bed transfer Chair/bed transfer activity did not occur: N/A Chair/bed transfer method: Ambulatory Chair/bed transfer assist level: Moderate assist (Pt 50 - 74%/lift or lower) Chair/bed transfer assistive device: Environmental manager lift: Ecologist     Max distance: 22ft Assist level: Touching or steadying assistance (Pt > 75%)   Wheelchair   Type: Manual Max wheelchair distance: 150ft Assist Level: Supervision or verbal cues  Cognition Comprehension Comprehension assist level: Follows basic conversation/direction with extra time/assistive device  Expression Expression assist level: Expresses basic needs/ideas: With extra time/assistive device  Social Interaction Social Interaction assist level: Interacts appropriately 90% of the time - Needs monitoring or encouragement for participation or interaction.  Problem Solving Problem solving assist level: Solves basic 50 - 74% of the time/requires cueing 25 - 49% of the time  Memory Memory assist level: Recognizes or recalls 50 - 74% of the time/requires cueing 25 - 49% of the time   Medical Problem List and Plan:  1. Decreased functional mobility secondary to encephalopathy due to urosepsis/multi-medical   Cont CIR   -therapy schedule adjusted to better accommodate his activity tolerance 2. DVT Prophylaxis/Anticoagulation: Eliquis. Monitor for any bleeding episodes  3. Pain Management: Lidoderm patch, topicals  Lumbar post laminectomy syndrome  avoiding narcotics due to sedation and confusion  schedule tylenol 650mg  qid   Kpad ordered with improvement  -team has modified therapy plan to allow restbreaks and bed time 4. Mood: Aricept 10 mg nightly, Cymbalta 30 mg nightly  5. Neuropsych: This patient is not capable of making decisions on his own behalf.  6. Skin/Wound  Care: Stage I sacral pressure injury. Routine skin checks  7. Fluids/Electrolytes/Nutrition: Routine in and outs  Encourage PO 8. ID. IV Rocephin completed 12/29/2017.  9. V. tach cardiac arrest with history of PAF/pacemaker. Patient currently off Tikosyn until follow-up outpatient with cardiology services.  10. Chronic systolic congestive heart failure. Monitor for any signs of fluid overload.   Lasix 40 mg   Filed Weights   01/11/18 0422 01/12/18 0656 01/13/18 0551  Weight: 91.3 kg (201 lb 4.5 oz) 83.9 kg (185 lb) 84.4 kg (186 lb)    ? Reliability on 7/26 11. Psoriatic arthritis. Methotrexate and chronic prednisone    Resumed medications: prednisone 5mg  daily and MTX 15mg  every Monday 12. Hypertension. Toprol-XL 25 mg daily. Monitor with increased mobility    Vitals:   01/12/18 2213 01/13/18 0551  BP: (!) 130/54 (!) 118/56  Pulse: 65 (!) 56  Resp: 16 18  Temp: 97.8 F (36.6 C) 97.8 F (36.6 C)  SpO2: 100% 100%   Relatively controlled on 7/26 13. Hyperlipidemia. Lipitor  14. CKD stage III.   Creatinine 1.45 on 7/22   continue to monitor 15. History of prostate cancer status post radiation. Patient on Flomax prior to admission 0.4 mg daily. Renal ultrasound no hydronephrosis. Follow-up outpatient Dr. Amalia Hailey  16. Acute on chronic anemia. Continue iron supplement.   Hemoglobin 9.5 on 7/25 17. Hypoalbuminemia  Supplement initiated 18. Leukocytosis: Resolved  WBCs 10.4 on 7/25  Afebrile  UA -/equivocal, urine culture is negative  No active signs of infection  19.  Hx RIght parietal and  BG infarcts- on eliquis   LOS (Days) 14 A FACE TO FACE EVALUATION WAS PERFORMED  Ankit Lorie Phenix, MD 01/13/2018 10:37 AM

## 2018-01-14 ENCOUNTER — Inpatient Hospital Stay (HOSPITAL_COMMUNITY): Payer: Self-pay | Admitting: Physical Therapy

## 2018-01-14 ENCOUNTER — Inpatient Hospital Stay (HOSPITAL_COMMUNITY): Payer: Self-pay | Admitting: Occupational Therapy

## 2018-01-14 NOTE — Progress Notes (Signed)
Pt has home machine but has refused for the last several nights. Rt will monitor.

## 2018-01-14 NOTE — Progress Notes (Signed)
[place] PHYSICAL MEDICINE & REHABILITATION     PROGRESS NOTE    Subjective/Complaints: Patient seen lying in bed this morning. He states he slept well overnight. He wants to go home.  ROS:  Denies CP, SOB, nausea, vomiting, diarrhea.  Objective:  No results found. Recent Labs    01/12/18 0457  WBC 10.4  HGB 9.5*  HCT 32.4*  PLT 394   No results for input(s): NA, K, CL, GLUCOSE, BUN, CREATININE, CALCIUM in the last 72 hours.  Invalid input(s): CO CBG (last 3)  No results for input(s): GLUCAP in the last 72 hours.  Wt Readings from Last 3 Encounters:  01/14/18 84.2 kg (185 lb 10 oz)  12/30/17 94.5 kg (208 lb 5.4 oz)  12/09/17 98.9 kg (218 lb)     Intake/Output Summary (Last 24 hours) at 01/14/2018 1259 Last data filed at 01/14/2018 0900 Gross per 24 hour  Intake 240 ml  Output -  Net 240 ml    Vital Signs: Blood pressure 127/64, pulse (!) 59, temperature 97.6 F (36.4 C), resp. rate 16, height 6\' 3"  (1.905 m), weight 84.2 kg (185 lb 10 oz), SpO2 100 %. Physical Exam:  Constitutional: No distress . Vital signs reviewed. HENT: Normocephalic.  Atraumatic. Eyes: EOMI. No discharge. Cardiovascular: RRR. No JVD. Respiratory: CTA bilaterally. Normal effort. GI: BS +. Non-distended. Musc: No edema or tenderness in extremities. Neurological:  Alert and oriented x2 Motor: B/l UE 4/5 prox to distal.  (stable) LLE: 4/5 proximal to distal  RLE: 4-/5 proximal to distal Psychiatric: pleasant and cooperative  Assessment/Plan: 1. Functional deficits secondary to urosepsis and subsequent encephalopathy and debility which require  interdisciplinary therapy in a comprehensive inpatient rehab setting. Physiatrist is providing close team supervision and 24 hour management of active medical problems listed below. Physiatrist and rehab team continue to assess barriers to discharge/monitor patient progress toward functional and medical goals.  Function:  Bathing Bathing  position Bathing activity did not occur: Refused Position: Production manager parts bathed by patient: Right arm, Left arm, Chest, Abdomen, Front perineal area, Right upper leg, Left upper leg Body parts bathed by helper: Left lower leg, Right lower leg, Back, Buttocks  Bathing assist Assist Level: Touching or steadying assistance(Pt > 75%)      Upper Body Dressing/Undressing Upper body dressing   What is the patient wearing?: Pull over shirt/dress     Pull over shirt/dress - Perfomed by patient: Thread/unthread right sleeve, Thread/unthread left sleeve, Put head through opening, Pull shirt over trunk Pull over shirt/dress - Perfomed by helper: Pull shirt over trunk        Upper body assist Assist Level: Supervision or verbal cues      Lower Body Dressing/Undressing Lower body dressing   What is the patient wearing?: Pants, Ted Hose, Non-skid slipper socks Underwear - Performed by patient: Thread/unthread right underwear leg, Thread/unthread left underwear leg, Pull underwear up/down Underwear - Performed by helper: Thread/unthread right underwear leg, Thread/unthread left underwear leg Pants- Performed by patient: Thread/unthread left pants leg Pants- Performed by helper: Thread/unthread right pants leg, Pull pants up/down Non-skid slipper socks- Performed by patient: Don/doff right sock Non-skid slipper socks- Performed by helper: Don/doff right sock, Don/doff left sock             TED Hose - Performed by patient: Don/doff right TED hose, Don/doff left TED hose TED Hose - Performed by helper: Don/doff right TED hose, Don/doff left TED hose  Lower body assist Assist for lower  body dressing: Assistive device Assistive Device Comment: reacher + sock aide    Toileting Toileting Toileting activity did not occur: No continent bowel/bladder event Toileting steps completed by patient: Performs perineal hygiene Toileting steps completed by helper: Adjust clothing after  toileting, Adjust clothing prior to toileting, Performs perineal hygiene Toileting Assistive Devices: Other (comment)(stedy)  Toileting assist Assist level: Two helpers   Transfers Chair/bed transfer Chair/bed transfer activity did not occur: N/A Chair/bed transfer method: Ambulatory Chair/bed transfer assist level: Moderate assist (Pt 50 - 74%/lift or lower) Chair/bed transfer assistive device: Environmental manager lift: Ecologist     Max distance: 41ft Assist level: Touching or steadying assistance (Pt > 75%)   Wheelchair   Type: Manual Max wheelchair distance: 180ft Assist Level: Supervision or verbal cues  Cognition Comprehension Comprehension assist level: Follows basic conversation/direction with extra time/assistive device  Expression Expression assist level: Expresses basic needs/ideas: With extra time/assistive device  Social Interaction Social Interaction assist level: Interacts appropriately 90% of the time - Needs monitoring or encouragement for participation or interaction.  Problem Solving Problem solving assist level: Solves basic 50 - 74% of the time/requires cueing 25 - 49% of the time  Memory Memory assist level: Recognizes or recalls 50 - 74% of the time/requires cueing 25 - 49% of the time   Medical Problem List and Plan:  1. Decreased functional mobility secondary to encephalopathy due to urosepsis/multi-medical   Cont CIR   -therapy schedule adjusted to better accommodate his activity tolerance 2. DVT Prophylaxis/Anticoagulation: Eliquis. Monitor for any bleeding episodes  3. Pain Management: Lidoderm patch, topicals  Lumbar post laminectomy syndrome  avoiding narcotics due to sedation and confusion  schedule tylenol 650mg  qid   Kpad ordered with improvement  No complaints today 4. Mood: Aricept 10 mg nightly, Cymbalta 30 mg nightly  5. Neuropsych: This patient is not capable of making decisions on his own behalf.  6. Skin/Wound Care:  Stage I sacral pressure injury. Routine skin checks  7. Fluids/Electrolytes/Nutrition: Routine in and outs  Encourage PO 8. ID. IV Rocephin completed 12/29/2017.  9. V. tach cardiac arrest with history of PAF/pacemaker. Patient currently off Tikosyn until follow-up outpatient with cardiology services.  10. Chronic systolic congestive heart failure. Monitor for any signs of fluid overload.   Lasix 40 mg   Filed Weights   01/12/18 0656 01/13/18 0551 01/14/18 0538  Weight: 83.9 kg (185 lb) 84.4 kg (186 lb) 84.2 kg (185 lb 10 oz)    Stable on 7/27 11. Psoriatic arthritis. Methotrexate and chronic prednisone    Resumed medications: prednisone 5mg  daily and MTX 15mg  every Monday 12. Hypertension. Toprol-XL 25 mg daily. Monitor with increased mobility    Vitals:   01/13/18 2037 01/14/18 0538  BP: 130/60 127/64  Pulse: 73 (!) 59  Resp: 17 16  Temp: 98.2 F (36.8 C) 97.6 F (36.4 C)  SpO2: 99% 100%   Controlled on 7/27 13. Hyperlipidemia. Lipitor  14. CKD stage III.   Creatinine 1.45 on 7/22   continue to monitor 15. History of prostate cancer status post radiation. Patient on Flomax prior to admission 0.4 mg daily. Renal ultrasound no hydronephrosis. Follow-up outpatient Dr. Amalia Hailey  16. Acute on chronic anemia. Continue iron supplement.   Hemoglobin 9.5 on 7/25 17. Hypoalbuminemia  Supplement initiated 18. Leukocytosis: Resolved  WBCs 10.4 on 7/25  Afebrile  UA -/equivocal, urine culture is negative  No active signs of infection  19.  Hx RIght parietal and BG infarcts- on eliquis  LOS (Days) [place] PERFORMED  Leeandra Ellerson Lorie Phenix, MD 01/14/2018 12:59 PM

## 2018-01-14 NOTE — Progress Notes (Signed)
Physical Therapy Session Note  Patient Details  Name: OZIEL BEITLER MRN: [id number] Date of Birth: [birth year]  Today's Date: 01/13/2018 PT Individual Time:1300-1400   60 min   Short Term Goals:  Week 2:  PT Short Term Goal 1 (Week 2): Pt will perform bed mobility with mod assist  PT Short Term Goal 2 (Week 2): Pt will perform stand pivot transfer with min assist  PT Short Term Goal 3 (Week 2): Pt will ambulate 9f with mod assist  PT Short Term Goal 4 (Week 2): Pt will initiate gait training.   Skilled Therapeutic Interventions/Progress Updates:   Pt received supine in bed and agreeable to PT. Supine>sit transfer with max assist and max cues for through log roll   Pt reports need for bowel movement.  [facility] to toilet with min-mod assist from PT. Successful BM on toilet. Sit<>stand from toielt with min assist x 3 for pericare and clothing management performed by PT   WC mobility x 1072fwith supervision assist from PT with min cues for improved UE ROM to reduce overall energy expenditure .   Stand pivot transfer x 2 with min assist form PT. Pt reports increased LowBackPain following second trasnfer.   Patient returned to room and perform stand pivot transfer to recliner with min assist from PT. [facility]   [facility] in reach and all needs met.             Therapy Documentation Precautions:  Precautions Precautions: Fall Precaution Comments: chest and ribs sore from CPR, chronic back pain Restrictions Weight Bearing Restrictions: No Other Position/Activity Restrictions:     Pain: Pain Assessment Pain Scale: 0-10 Pain Score: 7  Pain Type: Chronic pain Pain Location: Back Pain Descriptors / Indicators: Aching Pain Onset: On-going Pain Intervention(s): Medication (See eMAR);Heat applied  See Function Navigator for Current Functional Status.   Therapy/Group: Individual Therapy  AuLorie Phenix/27/2019, 11:26 AM

## 2018-01-14 NOTE — Progress Notes (Signed)
Occupational Therapy Session Note  Patient Details  Name: Christopher Baker MRN: [id number] Date of Birth: February 21, 1934  Today's Date: 01/14/2018 OT Individual Time: 1117-3567 OT Individual Time Calculation (min): 70 min   Skilled Therapeutic Interventions/Progress Updates:    Pt greeted supine in bed, with 8/10 pain in lower back. Already medicated. It took [place] 20 minutes to get OOB and transfer to w/c with RW and Mod A (due to pain). He requested to use the restroom. Min-Mod A stand pivot to toilet with use of grab bars. Pt incontinent of bowels prior to reaching toilet. He urinated while seated. Worked on upright posture, standing tolerance, and static balance with RW while OT completed perihygiene and clothing mgt x2. He transferred back to w/c using RW. Oral care/grooming tasks completed w/c level for energy conservation. Afterwards UB/LB dressing completed with use of AE. He required cuing and physical assist to use reacher and sock aide appropriately. At end of session he ambulated short distance to recliner with steady assist and RW. Pt left with all needs within reach and safety belt fastened at time of departure.   Therapy Documentation Precautions:  Precautions Precautions: Fall Precaution Comments: chest and ribs sore from CPR, chronic back pain Restrictions Weight Bearing Restrictions: No Other Position/Activity Restrictions:   Vital Signs: Therapy Vitals Temp: 97.6 F (36.4 C) Pulse Rate: (!) 59 Resp: 16 BP: 127/64 Patient Position (if appropriate): Lying Oxygen Therapy SpO2: 100 % O2 Device: Room Air Pain: Used MHP for pain mgt while seated throughout session. We also took several prolonged rest periods and pt was guided through diaphragmatic breathing exercises  Pain Assessment Pain Scale: 0-10 Pain Score: 7  Pain Type: Chronic pain Pain Location: Back Pain Descriptors / Indicators: Aching Pain Onset: On-going Pain Intervention(s): Medication (See eMAR);Heat  applied ADL:      See Function Navigator for Current Functional Status.   Therapy/Group: Individual Therapy  [name] [name] 01/14/2018, 9:13 AM

## 2018-01-14 NOTE — Progress Notes (Signed)
Physical Therapy Weekly Progress Note  Patient Details  Name: Christopher Baker MRN: [id number] Date of Birth: [birth year]  Beginning of progress report period: January 07, 2018 End of progress report period: January 14, 2018  Today's Date: 01/14/2018 PT Individual Time: 1100-1200 PT Individual Time Calculation (min): 60 min   Patient has met 3 of 4 short term goals.  Pt has made decent progress over the past week with fluctuating gains in strength, and mobility. Pain management seam to be this patient's most limiting factor, and has noted to have decreased problem solving abilities when in profound back pain. Overall pt has progressed to mod-max assist bed mobility, min-mod assist transfers to and from [facility] and min assist ambulation up to 45 ft.   Patient continues to demonstrate the following deficits muscle weakness and muscle joint tightness, decreased cardiorespiratoy endurance, decreased problem solving, decreased safety awareness, decreased memory and delayed processing and decreased standing balance and decreased balance strategies and therefore will continue to benefit from skilled PT intervention to increase functional independence with mobility.  Patient progressing toward long term goals..  Continue plan of care.  PT Short Term Goals Week 2:  PT Short Term Goal 1 (Week 2): Pt will perform bed mobility with mod assist  PT Short Term Goal 1 - Progress (Week 2): Progressing toward goal PT Short Term Goal 2 (Week 2): Pt will perform stand pivot transfer with min assist  PT Short Term Goal 2 - Progress (Week 2): Met PT Short Term Goal 3 (Week 2): Pt will ambulate 54f with mod assist  PT Short Term Goal 3 - Progress (Week 2): Met PT Short Term Goal 4 (Week 2): Pt will initiate stair training.  PT Short Term Goal 4 - Progress (Week 2): Met Week 3:  PT Short Term Goal 1 (Week 3): STG =LTG due to ELOS  Skilled Therapeutic Interventions/Progress Updates:   Pt received sitting in WC and agreeable  to PT. [facility]. WC mobility x 1060fwith supervision assist and min cues for increased stroke length. Gait training with RW x 4557fith min assist and min cues from PT for safety in turns and improved terminal knee extension. Car transfer training  With stand pivot technique and min assist from PT. Stair training instructed by PT x 3 steps ( 6 in) with mod assist and BUE support on rails. Moderate cues for step to gait pattern by PT. Pt returned to room and performed stand pivot transfer to bed with min. Sit>supine completed with mod assist to control BLE and manage back pain. Pt  left supine in bed with call bell in reach and all needs met.        Therapy Documentation Precautions:  Precautions Precautions: Fall Precaution Comments: chest and ribs sore from CPR, chronic back pain Restrictions Weight Bearing Restrictions: No Other Position/Activity Restrictions:   Vital Signs: Therapy Vitals Temp: 97.9 F (36.6 C) Temp Source: Oral Pulse Rate: 69 Resp: 18 BP: (!) 150/69 Patient Position (if appropriate): Lying Oxygen Therapy SpO2: 96 % O2 Device: Room Air Pain: Pain Assessment Pain Scale: 0-10 Pain Score: 2  Pain Type: Chronic pain Pain Location: Back Pain Descriptors / Indicators: Aching Pain Onset: On-going Pain Intervention(s): Medication (See eMAR)   See Function Navigator for Current Functional Status.  Therapy/Group: Individual Therapy  AusLorie Phenix27/2019, 5:34 PM

## 2018-01-15 ENCOUNTER — Inpatient Hospital Stay (HOSPITAL_COMMUNITY): Payer: Self-pay

## 2018-01-15 ENCOUNTER — Inpatient Hospital Stay (HOSPITAL_COMMUNITY): Payer: Self-pay | Admitting: Occupational Therapy

## 2018-01-15 NOTE — Progress Notes (Signed)
Occupational Therapy Session Note  Patient Details  Name: Christopher Baker MRN: [id number] Date of Birth: [birth year]  Today's Date: 01/15/2018 OT Individual Time: 9201-0071 OT Individual Time Calculation (min): 55 min   Skilled Therapeutic Interventions/Progress Updates:    Pt greeted supine in bed with 9/10 c/o pain in low back. RN provided medication during session, OT applied muscle rub, and we used k pad for all seated portions of ADLs to address pain. He completed stand pivot<w/c<TTB with Min A and cues for extending knees. He bathed while seated on TTB with cues to use lateral leans for perihygiene and LH sponge for LEs. Dressing completed w/c level at sink sit<stand with several prolonged rest breaks due to fatigue/pain. Assist required for AE use during LB self care. At end of session pt adamant that he wanted to return to bed vs stay up in recliner. He ambulated short distance from sink using RW with steady assist and transferred back to bed. Pt left with all needs within reach and k pad for lower back.   Therapy Documentation Precautions:  Precautions Precautions: Fall Precaution Comments: chest and ribs sore from CPR, chronic back pain Restrictions Weight Bearing Restrictions: No Other Position/Activity Restrictions:   Vital Signs: Therapy Vitals Temp: 97.6 F (36.4 C) Temp Source: Oral Pulse Rate: (!) 57 Resp: 16 BP: 123/71 Patient Position (if appropriate): Lying Oxygen Therapy SpO2: 97 % O2 Device: Room Air Pain: Addressed as written above  Pain Assessment Pain Scale: 0-10 Pain Score: 10-Worst pain ever Pain Type: Acute pain Pain Location: Back Pain Orientation: Lower Pain Descriptors / Indicators: Aching Pain Frequency: Intermittent Pain Onset: Gradual Patients Stated Pain Goal: 2 Pain Intervention(s): Medication (See eMAR);Repositioned;Heat applied Multiple Pain Sites: No ADL:      See Function Navigator for Current Functional Status.   Therapy/Group:  Individual Therapy  Marianita Botkin A Laverda Stribling 01/15/2018, 8:59 AM

## 2018-01-15 NOTE — Progress Notes (Signed)
Physical Therapy Session Note  Patient Details  Name: Christopher Baker MRN: [id number] Date of Birth: [birth year]  Today's Date: 01/15/2018 PT Individual Time: 1540-0867 PT Individual Time Calculation (min): 40 min  and Today's Date: 01/15/2018 PT Missed Time: 35 Minutes Missed Time Reason: Pain;Patient unwilling to participate  Short Term Goals: Week 3:  PT Short Term Goal 1 (Week 3): STG =LTG due to ELOS  Skilled Therapeutic Interventions/Progress Updates:    Pt supine in bed upon PT arrival. Pt requests urinal from therapist, declines getting OOB to go into bathroom as therapist suggested. Pt agreeable to try getting up however reports pain 10/10 in low back. Pt transferred from supine>sidelying with min assist and sidelying>sitting with mod assist, manual facilitation for weightshifting, increased time secondary to pain. Pt in sitting with posterior lean, pt states "hold me up, hold me up," requiring mod assist to maintain sitting balance. Pt attempts to perform sit<>stand with RW and then states "I can't do this, lay me back down." Therapist assist pt back to sidelying on bed with mod assist. Therapist educates pt on importance of OOB activity and exercise for low back pain management. Pt attempts to sit back up, therapist providing max assist. Pt states "just let me be, lay me down and forget about me for the day." Therapist assists pt to supine with mod assist. Pt performs bridging with manual facilitation for LE placement in order to scoot up in bed. Pt left supine in bed with needs in reach, RN notified for pain medicine and heat applied for back pain control.  Pt missed 35 minutes of skilled therapy tx this session secondary to pain.   Therapy Documentation Precautions:  Precautions Precautions: Fall Precaution Comments: chest and ribs sore from CPR, chronic back pain Restrictions Weight Bearing Restrictions: No Other Position/Activity Restrictions:     See Function Navigator for  Current Functional Status.   Therapy/Group: Individual Therapy  Netta Corrigan, PT, DPT 01/15/2018, 8:00 AM

## 2018-01-15 NOTE — Progress Notes (Signed)
RT NOTE:  Pt has home CPAP @ bedside. Pt will manage if he wants to wear. RT assistance no desired.

## 2018-01-15 NOTE — Progress Notes (Signed)
[place] PHYSICAL MEDICINE & REHABILITATION     PROGRESS NOTE    Subjective/Complaints: Patient seen lying in bed this morning.  He states he slept well overnight.  He states he would like to go home.  ROS: Denies CP, SOB, nausea, vomiting, diarrhea.  Objective:  No results found. No results for input(s): WBC, HGB, HCT, PLT in the last 72 hours. No results for input(s): NA, K, CL, GLUCOSE, BUN, CREATININE, CALCIUM in the last 72 hours.  Invalid input(s): CO CBG (last 3)  No results for input(s): GLUCAP in the last 72 hours.  Wt Readings from Last 3 Encounters:  01/15/18 82.1 kg (181 lb)  12/30/17 94.5 kg (208 lb 5.4 oz)  12/09/17 98.9 kg (218 lb)     Intake/Output Summary (Last 24 hours) at 01/15/2018 1355 Last data filed at 01/14/2018 1800 Gross per 24 hour  Intake 240 ml  Output -  Net 240 ml    Vital Signs: Blood pressure (!) 139/56, pulse 60, temperature 97.7 F (36.5 C), temperature source Oral, resp. rate 20, height 6\' 3"  (1.905 m), weight 82.1 kg (181 lb), SpO2 97 %. Physical Exam:  Constitutional: No distress . Vital signs reviewed. HENT: Normocephalic.  Atraumatic. Eyes: EOMI. No discharge. Cardiovascular: RRR.  No JVD. Respiratory: CTA bilaterally.  Normal effort. GI: BS +. Non-distended. Musc: No edema or tenderness in extremities. Neurological:  Alert and oriented x2 Motor: B/l UE 4/5 prox to distal.  (unchanged) LLE: 4/5 proximal to distal  RLE: 4-/5 proximal to distal (unchanged) Psychiatric: pleasant and cooperative  Assessment/Plan: 1. Functional deficits secondary to urosepsis and subsequent encephalopathy and debility which require  interdisciplinary therapy in a comprehensive inpatient rehab setting. Physiatrist is providing close team supervision and 24 hour management of active medical problems listed below. Physiatrist and rehab team continue to assess barriers to discharge/monitor patient progress toward functional and medical  goals.  Function:  Bathing Bathing position Bathing activity did not occur: Refused Position: Production manager parts bathed by patient: Right arm, Left arm, Chest, Abdomen, Front perineal area, Right upper leg, Left upper leg, Buttocks, Right lower leg, Left lower leg Body parts bathed by helper: Back  Bathing assist Assist Level: Touching or steadying assistance(Pt > 75%)      Upper Body Dressing/Undressing Upper body dressing   What is the patient wearing?: Pull over shirt/dress     Pull over shirt/dress - Perfomed by patient: Thread/unthread right sleeve, Thread/unthread left sleeve, Put head through opening, Pull shirt over trunk Pull over shirt/dress - Perfomed by helper: Pull shirt over trunk        Upper body assist Assist Level: Supervision or verbal cues      Lower Body Dressing/Undressing Lower body dressing   What is the patient wearing?: Pants, Ted Hose, Non-skid slipper socks Underwear - Performed by patient: Thread/unthread right underwear leg, Thread/unthread left underwear leg, Pull underwear up/down Underwear - Performed by helper: Thread/unthread right underwear leg, Thread/unthread left underwear leg Pants- Performed by patient: Thread/unthread left pants leg Pants- Performed by helper: Thread/unthread right pants leg, Pull pants up/down Non-skid slipper socks- Performed by patient: Don/doff right sock Non-skid slipper socks- Performed by helper: Don/doff right sock, Don/doff left sock             TED Hose - Performed by patient: Don/doff right TED hose, Don/doff left TED hose TED Hose - Performed by helper: Don/doff right TED hose, Don/doff left TED hose  Lower body assist Assist for lower body dressing:  Assistive device Assistive Device Comment: reacher + sock Equities trader activity did not occur: No continent bowel/bladder event Toileting steps completed by patient: Adjust clothing prior to toileting, Performs  perineal hygiene, Adjust clothing after toileting Toileting steps completed by helper: Adjust clothing after toileting, Adjust clothing prior to toileting, Performs perineal hygiene Toileting Assistive Devices: Other (comment)(stedy)  Toileting assist Assist level: Two helpers   Transfers Chair/bed transfer Chair/bed transfer activity did not occur: N/A Chair/bed transfer method: Stand pivot Chair/bed transfer assist level: Touching or steadying assistance (Pt > 75%) Chair/bed transfer assistive device: Environmental manager lift: Ecologist     Max distance: 35ft Assist level: Touching or steadying assistance (Pt > 75%)   Wheelchair   Type: Manual Max wheelchair distance: 116ft Assist Level: Supervision or verbal cues  Cognition Comprehension Comprehension assist level: Follows basic conversation/direction with no assist  Expression Expression assist level: Expresses basic needs/ideas: With no assist  Social Interaction Social Interaction assist level: Interacts appropriately 90% of the time - Needs monitoring or encouragement for participation or interaction.  Problem Solving Problem solving assist level: Solves basic 90% of the time/requires cueing < 10% of the time  Memory Memory assist level: Recognizes or recalls 90% of the time/requires cueing < 10% of the time   Medical Problem List and Plan:  1. Decreased functional mobility secondary to encephalopathy due to urosepsis/multi-medical   Cont CIR   -therapy schedule adjusted to better accommodate his activity tolerance 2. DVT Prophylaxis/Anticoagulation: Eliquis. Monitor for any bleeding episodes  3. Pain Management: Lidoderm patch, topicals  Lumbar post laminectomy syndrome  avoiding narcotics due to sedation and confusion  schedule tylenol 650mg  qid   Kpad ordered with improvement  No complaints today 4. Mood: Aricept 10 mg nightly, Cymbalta 30 mg nightly  5. Neuropsych: This patient is not capable of  making decisions on his own behalf.  6. Skin/Wound Care: Stage I sacral pressure injury. Routine skin checks  7. Fluids/Electrolytes/Nutrition: Routine in and outs  Encourage PO 8. ID. IV Rocephin completed 12/29/2017.  9. V. tach cardiac arrest with history of PAF/pacemaker. Patient currently off Tikosyn until follow-up outpatient with cardiology services.  10. Chronic systolic congestive heart failure. Monitor for any signs of fluid overload.   Lasix 40 mg   Filed Weights   01/13/18 0551 01/14/18 0538 01/15/18 0504  Weight: 84.4 kg (186 lb) 84.2 kg (185 lb 10 oz) 82.1 kg (181 lb)    Stable on 7/28 11. Psoriatic arthritis. Methotrexate and chronic prednisone    Resumed medications: prednisone 5mg  daily and MTX 15mg  every Monday 12. Hypertension. Toprol-XL 25 mg daily. Monitor with increased mobility    Vitals:   01/15/18 0504 01/15/18 1257  BP: 123/71 (!) 139/56  Pulse: (!) 57 60  Resp: 16 20  Temp: 97.6 F (36.4 C) 97.7 F (36.5 C)  SpO2: 97% 97%   Controlled on 7/28 13. Hyperlipidemia. Lipitor  14. CKD stage III.   Creatinine 1.45 on 7/22  Labs ordered for tomorrow   continue to monitor 15. History of prostate cancer status post radiation. Patient on Flomax prior to admission 0.4 mg daily. Renal ultrasound no hydronephrosis. Follow-up outpatient Dr. Amalia Hailey  16. Acute on chronic anemia. Continue iron supplement.   Hemoglobin 9.5 on 7/25 17. Hypoalbuminemia  Supplement initiated 18. Leukocytosis: Resolved  WBCs 10.4 on 7/25  Afebrile  UA -/equivocal, urine culture is negative  No active signs of infection  19.  Hx RIght parietal  and BG infarcts- on eliquis   LOS (Days) 16 A FACE TO FACE EVALUATION WAS PERFORMED  Ankit Lorie Phenix, MD 01/15/2018 1:55 PM

## 2018-01-16 ENCOUNTER — Inpatient Hospital Stay (HOSPITAL_COMMUNITY): Payer: Self-pay | Admitting: Occupational Therapy

## 2018-01-16 ENCOUNTER — Inpatient Hospital Stay (HOSPITAL_COMMUNITY): Payer: Self-pay | Admitting: Physical Therapy

## 2018-01-16 ENCOUNTER — Encounter (HOSPITAL_COMMUNITY): Payer: Self-pay | Admitting: Psychology

## 2018-01-16 ENCOUNTER — Inpatient Hospital Stay (HOSPITAL_COMMUNITY): Payer: Self-pay

## 2018-01-16 LAB — BASIC METABOLIC PANEL
Anion gap: 8 (ref 5–15)
BUN: 26 mg/dL — AB (ref 8–23)
CO2: 26 mmol/L (ref 22–32)
CREATININE: 1.28 mg/dL — AB (ref 0.61–1.24)
Calcium: 9.5 mg/dL (ref 8.9–10.3)
Chloride: 107 mmol/L (ref 98–111)
GFR calc Af Amer: 58 mL/min — ABNORMAL LOW (ref >60)
GFR, EST NON AFRICAN AMERICAN: 50 mL/min — AB (ref >60)
Glucose, Bld: 115 mg/dL — ABNORMAL HIGH (ref 70–99)
POTASSIUM: 3.7 mmol/L (ref 3.5–5.1)
SODIUM: 141 mmol/L (ref 135–145)

## 2018-01-16 NOTE — Consult Note (Signed)
Neuropsychological Consultation   Patient:   Christopher Baker   DOB:   [birth year]  MR Number:  [id number]  Location:  [facility] [address] [id number] [place] [address] Dept: [place]: [phone]           Date of Service:   01/16/2018  Start Time:   9 AM End Time:   10 AM  Provider/Observer:  Ilean Skill, Psy.D.       Clinical Neuropsychologist       Billing Code/Service: [phone] 4 Units  Chief Complaint:    Christopher Baker is an 84 year old male with history of prostate cancer, psoriatic arthritis, PAF/pacemaker, prior CVA, chronic systolic CHF, mild dementia (likely vascular) on Aricept, hypertension and borderline diabetes. Presented 12/23/2017 with altered mental status.  ED noted fever as well as elevated HR.  Placed on broad-spectrum antibiotic for suspected urosepsis.  Patient completed 7 day course of Rocephin 12/29/2017 for bacteremia/urosepsis.     Reason for Service:  Patient referred for neuropsychological consultation due to coping and adjustment to extended hospital stay.  Below is the HPI for the current admission.  HPI: Christopher Baker. Christopher Baker is a 84 year old right-handed male with history of prostate cancer status post radiation, psoriatic arthritis maintained on methotrexate and chronic prednisone, PAF/pacemaker maintained on Eliquis as well as Tikosyn, prior CVA, chronic systolic congestive heart failure with ejection fraction 25%, mild dementia maintained on Aricept, hypertension as well as borderline diabetes mellitus. Per chart review patient lives with his wife. Used a rolling walker prior to admission. Reports patient had been essentially bedbound for the past week but was able to stand and complete a stand pivot transfer. Presented 12/23/2017 with altered mental status. In the ED noted fever as well as heart rate in the 150s. Placed on broad-spectrum antibiotics for suspected  urosepsis. Chest x-ray showed some vascular congestion probable mild pulmonary edema, troponin 1.55, urine culture greater than 100,000 E. coli, lactic acid 2.6, creatinine 1.37. Patient developed V. tach arrest later that evening followed by cardiology services. Patient did receive CPR. Maintained on lidocaine drip from 7/6 to 12/27/2017. Echocardiogram ejection fraction 30% diffuse hypokinesis. Renal ultrasound no hydronephrosis. No further work-up indicated per cardiology services. CT of the abdomen with no acute findings. Patient completed 7-day course of Rocephin 12/29/2017 for treatment of bacteremia/urosepsis per ID recommendation. Follow-up blood cultures no growth. Noted stage I pressure injury presented on admission with routine skin care. Physical and occupational therapy evaluations completed with recommendations of physical medicine rehab consult. This morning patient received oxycodone for low back pain and developed dizziness/nausea/difficulty staying awake. Wife states she has used 1/2 to 1/4 tab at home before for severe pain only. Patient was admitted for a comprehensive rehab program.   Current Status:  Patient was awake and alert today with good mental status.  Wife was there as well.  Described stroke 4 years ago with memory issues and improvement on Aricept.  Patient did have times during 1 hour visit where he would fall asleep while I was talking to wife but he would become alert when talking with him.  Wife reports that he is more fatigued now due to medical illness.  Patient did not appear to have any anxiety but more just ready to get back home.      Behavioral Observation: Christopher Baker  presents as a 84 y.o.-year-old Right Caucasian Male who appeared his stated age. his dress was Appropriate  and he was Well Groomed and his manners were Appropriate to the situation.  his participation was indicative of Appropriate and Drowsy behaviors.  There were any physical disabilities noted.  he  displayed an appropriate level of cooperation and motivation.     Interactions:    Active Appropriate and Drowsy  Attention:   within normal limits and attention span and concentration were age appropriate  Memory:   abnormal; remote memory intact, recent memory impaired  Visuo-spatial:  not examined  Speech (Volume):  low  Speech:   normal; normal  Thought Process:  Coherent and Relevant  Though Content:  WNL; not suicidal and not homicidal  Orientation:   person, place, time/date and situation  Judgment:   Fair  Planning:   Fair  Affect:    Appropriate  Mood:    Irritable  Insight:   Good  Intelligence:   normal  Medical History:   Past Medical History:  Diagnosis Date  . Anxiety   . Arthritis    "hands, right hip; lower back" (02/28/2017)  . Arthritis with psoriasis ([place])   . Borderline diabetes    DIET CONTROLLED - PT STATES HE IS NOT DIABETIC  . Carotid stenosis, bilateral PER DR EARLY / DUPLEX  09-09-10     40 - 59%   BILATERALLY--- ASYMPTOMATIC  . Chronic lower back pain   . Depression   . Dysrhythmia    a-fib  . GERD (gastroesophageal reflux disease)    CONTROLLED W/ PROTONIX  . History of gout   . HTN (hypertension)   . Hyperlipidemia   . Iron deficiency   . Lumbar spondylosis W/ RADICULOPATHY  . OSA on CPAP   . PAF (paroxysmal atrial fibrillation) ([place])   . Presence of permanent cardiac pacemaker DDD-  06-04-10-   DDD; SECONDARY TO SYNCOPY AND BRADYCARDIA  . Prostate cancer ([place]) 2008   S/P 54 RADIATION  [place]    . Restless leg syndrome   . Stroke [facility]) Oct. 14, 2014   light left hand, balance issues, short term memory loss 2014  . SVT (supraventricular tachycardia) ([place])    S/P ABLATION   2008           Psychiatric History:  Prior history of anxiety.  Family Med/Psych History:  Family History  Problem Relation Age of Onset  . Stroke Mother   . Early death Father     Risk of Suicide/Violence: virtually non-existent Patient denies SI  or HI.    Impression/DX:  Christopher Baker is an 85 year old male with history of prostate cancer, psoriatic arthritis, PAF/pacemaker, prior CVA, chronic systolic CHF, mild dementia (likely vascular) on Aricept, hypertension and borderline diabetes. Presented 12/23/2017 with altered mental status.  ED noted fever as well as elevated HR.  Placed on broad-spectrum antibiotic for suspected urosepsis.  Patient completed 7 day course of Rocephin 12/29/2017 for bacteremia/urosepsis.     Patient was awake and alert today with good mental status.  Wife was there as well.  Described stroke 4 years ago with memory issues and improvement on Aricept.  Patient did have times during 1 hour visit where he would fall asleep while I was talking to wife but he would become alert when talking with him.  Wife reports that he is more fatigued now due to medical illness.  Patient did not appear to have any anxiety but more just ready to get back home.  Diagnosis:    Encephalopathy - Plan: Ambulatory referral to Physical Medicine Rehab  Electronically Signed   _______________________ Ilean Skill, Psy.D.

## 2018-01-16 NOTE — Progress Notes (Signed)
Occupational Therapy Session Note  Patient Details  Name: Christopher Baker MRN: [id number] Date of Birth: Feb 20, 1934  Today's Date: 01/16/2018 OT Individual Time: 0800-0903 OT Individual Time Calculation (min): 63 min    Short Term Goals: Week 3:  OT Short Term Goal 1 (Week 3): Continue working on established LTGs set at supervision to min assist level.  Skilled Therapeutic Interventions/Progress Updates:    Pt completed bathing and dressing during session with spouse present.  He completed transfer from EOB to the toilet with min assist using the RW.  Increased bilateral knee flexion present in standing and during mobility.  Pt with bowel incontinence in the brief prior to transfer to the toilet.  Max assist for clothing management and toilet hygiene.  Pt's spouse reports having a 3:1 at home as well as built in seats in the shower and grab bars.  Discussed transfer method of stepping in backwards to get into the shower at home but did not practice it this session.  Pt transferred to the walk-in shower from the 3:1 for bathing.  Min assist for completion of bathing with use of the LH sponge for washing his feet and min assist for sit to stand using the rail to wash his peri area.  Once bathing was completed, he ambulated out to the bedside recliner where he completed dressing.  Supervision for UB dressing with min assist to donn brief and pull up pants.  Therapist assisted with donning TEDs and socks secondary to time.  Pt's spouse planning to come back in tomorrow for more education.  Pt left sitting in the bedside recliner with call button and phone in reach.    Therapy Documentation Precautions:  Precautions Precautions: Fall Precaution Comments: chest and ribs sore from CPR, chronic back pain Restrictions Weight Bearing Restrictions: No Other Position/Activity Restrictions:    Pain: Pain Assessment Pain Scale: 0-10 Pain Score: 0-No pain Faces Pain Scale: Hurts a little bit Pain Type:  Acute pain Pain Location: Back Pain Orientation: Lower Pain Descriptors / Indicators: Aching Pain Onset: On-going Pain Intervention(s): Repositioned;Back rub ADL: See Function Navigator for Current Functional Status.   Therapy/Group: Individual Therapy  Adien Kimmel OTR/L 01/16/2018, 12:22 PM

## 2018-01-16 NOTE — Progress Notes (Signed)
Physical Therapy Session Note  Patient Details  Name: Christopher Baker MRN: [id number] Date of Birth: 14-Feb-1934  Today's Date: 01/16/2018 PT Individual Time: 1100-1200 PT Individual Time Calculation (min): 60 min   Short Term Goals: Week 3:  PT Short Term Goal 1 (Week 3): STG =LTG due to ELOS  Skilled Therapeutic Interventions/Progress Updates: Pt presented in recliner requiring encouragement to participate in therapy. Pt c/o pain 8-9/10, PTA notified nsg and medication administered. Pt required significant time to get out of chair to ambulate to w/c. Performed sit to stand from recliner CGA and ambulated approx 43f then requesting to sit in standard chair. After seated rest pt able to ambulate additional 877fto w/c. Pt transported to rehab gym and participated in stair training. Pt ascended/descended x 4 step with B rails, minA for ascending, modA for descending. Pt required max cues for increased knee extension for lead foot when descending. Pt with poorly controlled descent when returning to chair requiring increased time for recovery due to fatigue and pain. Attempted to have pt ambulate again however pt only able to take a few steps then stating "I can't do this", PTA provided education on benefits of  continuing all  mobility to decrease debility. Pt then indicating need for restroom. Transported pt to bathroom and performed stand pivot with RW to toilet (+BM), PTA providing total A for clothing management. Pt returned to w/c in same manner as prior and remained in w/c with hot pack applied to back, call bell within reach and needs met.      Therapy Documentation Precautions:  Precautions Precautions: Fall Precaution Comments: chest and ribs sore from CPR, chronic back pain Restrictions Weight Bearing Restrictions: No Other Position/Activity Restrictions:     See Function Navigator for Current Functional Status.   Therapy/Group: Individual Therapy  [name]  [name],  PTA  01/16/2018, 3:14 PM

## 2018-01-16 NOTE — Progress Notes (Signed)
[place] PHYSICAL MEDICINE & REHABILITATION     PROGRESS NOTE    Subjective/Complaints: Pt seen lying in bed this AM.  He states he slept well overnight.  Wife at bedside with several questions.   ROS: Denies CP, SOB, nausea, vomiting, diarrhea.  Objective:  No results found. No results for input(s): WBC, HGB, HCT, PLT in the last 72 hours. Recent Labs    01/16/18 0518  NA 141  K 3.7  CL 107  GLUCOSE 115*  BUN 26*  CREATININE 1.28*  CALCIUM 9.5   CBG (last 3)  No results for input(s): GLUCAP in the last 72 hours.  Wt Readings from Last 3 Encounters:  01/16/18 55.3 kg (122 lb)  12/30/17 94.5 kg (208 lb 5.4 oz)  12/09/17 98.9 kg (218 lb)    No intake or output data in the 24 hours ending 01/16/18 0918  Vital Signs: Blood pressure 120/61, pulse (!) 45, temperature 97.7 F (36.5 C), temperature source Oral, resp. rate 18, height 6\' 3"  (1.905 m), weight 55.3 kg (122 lb), SpO2 99 %. Physical Exam:  Constitutional: No distress . Vital signs reviewed. HENT: Normocephalic.  Atraumatic. Eyes: EOMI. No discharge. Cardiovascular: RRR. No JVD. Respiratory: CTA bilaterally. Normal effort. GI: BS +. Non-distended. Musc: No edema or tenderness in extremities. Neurological:  Alert and oriented x3, except for year Motor: B/l UE 4/5 prox to distal.  (stable) LLE: 4/5 proximal to distal  RLE: 4-/5 proximal to distal (stable) Psychiatric: pleasant and cooperative  Assessment/Plan: 1. Functional deficits secondary to urosepsis and subsequent encephalopathy and debility which require  interdisciplinary therapy in a comprehensive inpatient rehab setting. Physiatrist is providing close team supervision and 24 hour management of active medical problems listed below. Physiatrist and rehab team continue to assess barriers to discharge/monitor patient progress toward functional and medical goals.  Function:  Bathing Bathing position Bathing activity did not occur:  Refused Position: Production manager parts bathed by patient: Right arm, Left arm, Chest, Abdomen, Front perineal area, Right upper leg, Left upper leg, Buttocks, Right lower leg, Left lower leg Body parts bathed by helper: Back  Bathing assist Assist Level: Touching or steadying assistance(Pt > 75%)      Upper Body Dressing/Undressing Upper body dressing   What is the patient wearing?: Pull over shirt/dress     Pull over shirt/dress - Perfomed by patient: Thread/unthread right sleeve, Thread/unthread left sleeve, Put head through opening, Pull shirt over trunk Pull over shirt/dress - Perfomed by helper: Pull shirt over trunk        Upper body assist Assist Level: Supervision or verbal cues      Lower Body Dressing/Undressing Lower body dressing   What is the patient wearing?: Pants, Ted Hose, Non-skid slipper socks Underwear - Performed by patient: Thread/unthread right underwear leg, Thread/unthread left underwear leg, Pull underwear up/down Underwear - Performed by helper: Thread/unthread right underwear leg, Thread/unthread left underwear leg Pants- Performed by patient: Thread/unthread left pants leg Pants- Performed by helper: Thread/unthread right pants leg, Pull pants up/down Non-skid slipper socks- Performed by patient: Don/doff right sock Non-skid slipper socks- Performed by helper: Don/doff right sock, Don/doff left sock             TED Hose - Performed by patient: Don/doff right TED hose, Don/doff left TED hose TED Hose - Performed by helper: Don/doff right TED hose, Don/doff left TED hose  Lower body assist Assist for lower body dressing: Assistive device Assistive Device Comment: reacher + sock aide  Toileting Toileting Toileting activity did not occur: No continent bowel/bladder event Toileting steps completed by patient: Adjust clothing prior to toileting, Performs perineal hygiene, Adjust clothing after toileting Toileting steps completed by  helper: Adjust clothing prior to toileting, Performs perineal hygiene, Adjust clothing after toileting Toileting Assistive Devices: Other (comment)(stedy)  Toileting assist Assist level: Two helpers   Transfers Chair/bed transfer Chair/bed transfer activity did not occur: N/A Chair/bed transfer method: Stand pivot Chair/bed transfer assist level: Touching or steadying assistance (Pt > 75%) Chair/bed transfer assistive device: Environmental manager lift: Ecologist     Max distance: 73ft Assist level: Touching or steadying assistance (Pt > 75%)   Wheelchair   Type: Manual Max wheelchair distance: 113ft Assist Level: Supervision or verbal cues  Cognition Comprehension Comprehension assist level: Understands basic 50 - 74% of the time/ requires cueing 25 - 49% of the time  Expression Expression assist level: Expresses basic 75 - 89% of the time/requires cueing 10 - 24% of the time. Needs helper to occlude trach/needs to repeat words.  Social Interaction Social Interaction assist level: Interacts appropriately 75 - 89% of the time - Needs redirection for appropriate language or to initiate interaction.  Problem Solving Problem solving assist level: Solves basic 75 - 89% of the time/requires cueing 10 - 24% of the time  Memory Memory assist level: Recognizes or recalls 75 - 89% of the time/requires cueing 10 - 24% of the time   Medical Problem List and Plan:  1. Decreased functional mobility secondary to encephalopathy due to urosepsis/multi-medical   Cont CIR   -therapy schedule adjusted to better accommodate his activity tolerance 2. DVT Prophylaxis/Anticoagulation: Eliquis. Monitor for any bleeding episodes  3. Pain Management: Lidoderm patch, topicals  Lumbar post laminectomy syndrome  avoiding narcotics due to sedation and confusion  schedule tylenol 650mg  qid   Kpad ordered with improvement  Improved 4. Mood: Aricept 10 mg nightly, Cymbalta 30 mg nightly  5.  Neuropsych: This patient is not capable of making decisions on his own behalf.  6. Skin/Wound Care: Stage I sacral pressure injury. Routine skin checks  7. Fluids/Electrolytes/Nutrition: Routine in and outs  Encourage PO 8. ID. IV Rocephin completed 12/29/2017.  9. V. tach cardiac arrest with history of PAF/pacemaker. Patient currently off Tikosyn until follow-up outpatient with cardiology services.  10. Chronic systolic congestive heart failure. Monitor for any signs of fluid overload.   Lasix 40 mg   Filed Weights   01/14/18 0538 01/15/18 0504 01/16/18 0453  Weight: 84.2 kg (185 lb 10 oz) 82.1 kg (181 lb) 55.3 kg (122 lb)    ?Reliability 11. Psoriatic arthritis. Methotrexate and chronic prednisone    Resumed medications: prednisone 5mg  daily and MTX 15mg  every Monday 12. Hypertension. Toprol-XL 25 mg daily. Monitor with increased mobility    Vitals:   01/15/18 2006 01/16/18 0453  BP: (!) 150/47 120/61  Pulse: 70 (!) 45  Resp: 20 18  Temp: 98.7 F (37.1 C) 97.7 F (36.5 C)  SpO2: 98% 99%   Labile on 7/29 13. Hyperlipidemia. Lipitor  14. CKD stage III.   Creatinine 1.28 on 7/29   continue to monitor 15. History of prostate cancer status post radiation. Patient on Flomax prior to admission 0.4 mg daily. Renal ultrasound no hydronephrosis. Follow-up outpatient Dr. Amalia Hailey  16. Acute on chronic anemia. Continue iron supplement.   Hemoglobin 9.5 on 7/25 17. Hypoalbuminemia  Supplement initiated 18. Leukocytosis: Resolved  WBCs 10.4 on 7/25  Afebrile  UA -/equivocal, urine  culture is negative  No active signs of infection  19.  Hx RIght parietal and BG infarcts- on eliquis  Greater than 35 minutes spent with patient and wife, with greater than 30 minutes in counseling regarding atrial fibrillation, endurance, discharge date, nutrition, back pain, education, functional status, etc.  LOS (Days) 17 A FACE TO FACE EVALUATION WAS PERFORMED  Chrisa Hassan Lorie Phenix, MD 01/16/2018 9:18 AM

## 2018-01-16 NOTE — Progress Notes (Signed)
Pt. Refused cpap. 

## 2018-01-16 NOTE — Progress Notes (Signed)
Speech Language Pathology Daily Session Note  Patient Details  Name: Christopher Baker MRN: [id number] Date of Birth: 04-13-1934  Today's Date: 01/16/2018 SLP Individual Time: 1302-1400 SLP Individual Time Calculation (min): 58 min  Short Term Goals: Week 3: SLP Short Term Goal 1 (Week 3): STG=LTG due to remaining length of stay  Skilled Therapeutic Interventions:Skilled ST services focused on cognitive skills and family education. SLP facilitated recall and basic problem solving utilizing 4-5 step sequence cards of functional tasks and familiar basic card game (played at home following CVA, Blink), pt required supervision A verbal cues for sequencing 4 pictures cards, min A verbal cues for sequencing 5 picture cards, supervision A verbal/question cues for problem solving in card game and for recall of rules. Pt demonstrated selective attention with supervision A verbal cues in 30 minute intervals. [name] joined session 10 minutes in. SLP provided education pertaining to memory strategies and continue need for assistance with higher level problem solving medication/money, wife stated agreement and was orginally in charge of pt's medication/money. [name] requested continuing will skilled ST services, since pt is not at baseline, SLP will confer with primary ST about continuation of services. Pt was left in room with call bell within reach and bed alarm set. Recommend to continue skilled ST services.      Function:  Eating Eating                 Cognition Comprehension Comprehension assist level: Understands basic 75 - 89% of the time/ requires cueing 10 - 24% of the time  Expression   Expression assist level: Expresses basic 75 - 89% of the time/requires cueing 10 - 24% of the time. Needs helper to occlude trach/needs to repeat words.;Expresses basic 90% of the time/requires cueing < 10% of the time.  Social Interaction Social Interaction assist level: Interacts appropriately 75 - 89%  of the time - Needs redirection for appropriate language or to initiate interaction.  Problem Solving Problem solving assist level: Solves basic 75 - 89% of the time/requires cueing 10 - 24% of the time;Solves basic 90% of the time/requires cueing < 10% of the time  Memory Memory assist level: Recognizes or recalls 75 - 89% of the time/requires cueing 10 - 24% of the time;Recognizes or recalls 90% of the time/requires cueing < 10% of the time    Pain Pain Assessment Pain Scale: 0-10 Pain Score: 0-No pain Pain Type: Acute pain Pain Location: Back Pain Orientation: Lower Pain Descriptors / Indicators: Aching Pain Onset: On-going Pain Intervention(s): Repositioned;Back rub  Therapy/Group: Individual Therapy  [name]  [facility] 01/16/2018, 2:44 PM

## 2018-01-17 ENCOUNTER — Inpatient Hospital Stay (HOSPITAL_COMMUNITY): Payer: Self-pay | Admitting: Speech Pathology

## 2018-01-17 ENCOUNTER — Inpatient Hospital Stay (HOSPITAL_COMMUNITY): Payer: Self-pay | Admitting: Occupational Therapy

## 2018-01-17 ENCOUNTER — Other Ambulatory Visit: Payer: Self-pay | Admitting: Licensed Clinical Social Worker

## 2018-01-17 ENCOUNTER — Inpatient Hospital Stay (HOSPITAL_COMMUNITY): Payer: Self-pay | Admitting: Physical Therapy

## 2018-01-17 DIAGNOSIS — Z8673 Personal history of transient ischemic attack (TIA), and cerebral infarction without residual deficits: Secondary | ICD-10-CM

## 2018-01-17 DIAGNOSIS — I5032 Chronic diastolic (congestive) heart failure: Secondary | ICD-10-CM

## 2018-01-17 NOTE — Discharge Summary (Signed)
Christopher Baker, Christopher Baker MEDICAL RECORD [id number] ACCOUNT [id number] DATE OF BIRTH:January 24, 1934 FACILITY: [facility] LOCATION: [facility] PHYSICIAN:ANKIT PATEL, MD  DISCHARGE SUMMARY  DATE OF DISCHARGE:  01/18/2018  DISCHARGE DIAGNOSES: 1.  Encephalopathy due to urosepsis. 2.  Eliquis for deep venous thrombosis prophylaxis.   3.  Pain management. 4.  Mood. 5.  Ventricular tachycardia cardiac arrest with history of PAF and pacemaker.   6.  Chronic systolic congestive heart failure.   7.  Psoriatic arthritis.   8.  Hypertension. 9.  Hyperlipidemia. 10.  Chronic kidney disease, stage III.   11.  History of prostate cancer and radiation.   12.  Acute on chronic anemia.   13.  History of right parietal and basal ganglia infarcts.  HOSPITAL COURSE:  This 84 year old right-handed male with history of prostate cancer with radiation therapy, psoriatic arthritis, maintained on methotrexate and prednisone, PAF with pacemaker, maintained on Eliquis.  The patient lives with wife.  Used a  rolling walker prior to admission.  Essentially has been bedbound up until recently.  Presented 12/23/2017 with altered mental status.   In the ED, noted fever, heart rate in the 150s.  Placed on broad spectrum antibiotics for suspected urosepsis.  Chest  x-ray showed some vascular congestion, probable mild pulmonary edema.  Troponin 1.55.  Urine culture greater than 100,000 E. coli.  Lactic acid 2.6, creatinine 1.37.    The patient developed tachycardia arrest later that evening.  Followed by cardiology services.  Did receive CPR.  Maintained on a lidocaine drip from 12/24/2017 to 12/27/2017.  Echocardiogram, ejection fraction 30%, diffuse hypokinesis.  Renal  ultrasound, no hydronephrosis.  CT of abdomen with no acute changes.  Completed a 7-day course of Rocephin for treatment of bacteremia, urosepsis per Infectious Disease followup.  Follow up blood cultures no growth.  Noted stage I pressure injury,  presented on  admission with routine skin care.  Physical and occupational therapy ongoing.  The patient was admitted for comprehensive rehab program.  PAST MEDICAL HISTORY:  See discharge diagnoses.  SOCIAL HISTORY:  Lives with his wife.  Had been using a rolling walker up until recently; had been bedbound.  FUNCTIONAL STATUS:  Upon admission to [facility], moderate assist to ambulate 6 feet rolling walker, minimal assist sit to stand, mod/max assist with activities of daily living.  PHYSICAL EXAMINATION: VITAL SIGNS:  Blood pressure 133/71, pulse 73, temperature 97, respirations 18. GENERAL:  Alert male provides his name and age.  He was somewhat lethargic. HEENT:  EOMs intact. NECK:  Supple, nontender, no JVD. CARDIOVASCULAR:  Rate controlled. ABDOMEN:  Soft, nontender, good bowel sounds. LUNGS:  Clear to auscultation without wheeze.  REHABILITATION HOSPITAL COURSE:  The patient was admitted to [facility].  Therapies initiated on a 3-hour daily basis, consisting of physical therapy, occupational therapy and rehabilitation nursing.  The following issues were  addressed during patient's rehabilitation stay:    Pertaining to the patient's encephalopathy due to urosepsis, he had completed a course of antibiotics.  Participating fully with his therapies.  He remained on chronic Eliquis for DVT prophylaxis as well as history of right parietal basal ganglia  infarctions.  No bleeding episodes.    Pain management with history of lumbar post-laminectomy syndrome, continued on a Lidoderm patch.  Avoiding narcotics as much as possible due to sedation.  He continued on Aricept as prior to admission.    Follow up per cardiology services for V-tach cardiac arrest.  History of pacemaker.  He would follow up cardiology  services.  No chest pain or shortness of breath.  He exhibited no other signs of fluid overload.  He continued on chronic methotrexate,  chronic prednisone for history of  psoriatic arthritis.    Renal function remains stable at 1.28.  He did have a history of prostate cancer with radiation therapy.  He remained on Flomax.  He would follow up with urology services as an outpatient.    Acute on chronic anemia, hemoglobin 9.5.    The patient received weekly collaborative interdisciplinary team conferences to discuss estimated length of stay, family teaching, any barriers to discharge.  The patient performs sit to stand from a recliner, contact guard.  Ambulates 5 feet short  distances with frequent rest breaks.  Could ambulate.  Can increase his distance with increased rest breaks.  Required max cues at times for knee extension.  Needed some assistance for lower body activities of daily living and homemaking.  Max assist for  clothing management and toileting hygiene, supervision for upper body dressing.  Full family teaching was completed with planned discharge to home.  DISCHARGE MEDICATIONS:  Included Eliquis 5 mg p.o. b.i.d., Lipitor 20 mg p.o. daily, vitamin B12 injection monthly, Aricept 10 mg p.o. at bedtime, Cymbalta 30 mg p.o. at bedtime, ferrous sulfate 325 mg p.o. b.i.d., Flonase 1 spray each nostril twice  daily.  Folic acid 1 mg at bedtime, Lasix 40 mg p.o. daily, Lidoderm patch, change as directed.  Methotrexate 15 mg p.o. weekly, Toprol-XL 25 mg p.o. at bedtime, Protonix 40 mg p.o. at bedtime, prednisone 5 mg p.o. breakfast, Flomax 0.4 mg p.o. daily,  Tylenol as needed.  DIET:  Regular.  The patient would follow up with Dr. Delice Lesch at the outpatient rehab service office as directed; Dr. Crissie Sickles, call for appointment.  Dr. Alona Bene, urology services.  Dr. Domenick Gong, medical management.  AN/NUANCE D:01/17/2018 T:01/17/2018 JOB:[id number]

## 2018-01-17 NOTE — Discharge Summary (Signed)
Discharge summary job [id number]

## 2018-01-17 NOTE — Progress Notes (Signed)
Pt has refused cpap at this time but is aware he can request it anytime.  RT will monitor.

## 2018-01-17 NOTE — Progress Notes (Signed)
Occupational Therapy Discharge Summary  Patient Details  Name: Christopher Baker MRN: [id number] Date of Birth: 12/10/1933  Today's Date: 01/17/2018 OT Individual Time: [phone])  12 minutes  Session Note:  [name] present for family education during session.  She was able to assist with supine to sit EOB with min assist and then help him transfer to the toilet and walk-in shower with min assist using the RW for support.  He completed bathing sit to stand with spouse assisting with peri hygiene as well.  Dressing completed sit to stand at the sink from the wheelchair with min assist.  Therapist had to thread his socks over the wide sockaide but then he was able to donn them.  Min assist for sit to stand and pulling pants and brief over his hips.  He completed all grooming and UB dressing in sitting with supervision.   Once ADL was completed, therapist took pt down to the ADL apartment where he practiced walk-in shower transfers with use of the simulated shower edge and min assist.  [name] able to see technique with pt taking the walker into the shower and turning to sit down on the shower seat.  He was able to ambulate out with the same method and min instructional cueing to bring the walker all the way outside of the shower and then step out.  Finished with return back to the room where he transferred to the bedside recliner with spouse present.  Pt has all OT related DME.  Did discuss need for wide sockaide if the one they have at home is not wide enough.  Pt also has reacher for use to donn clothing over feet and for removal of gripper socks.  Chair alarm belt in place and call button in reach.    Patient has met 10 of 12 long term goals due to improved activity tolerance, improved balance, ability to compensate for deficits, improved attention and improved awareness.  Patient to discharge at [facility] Assist level.  Patient's care partner is independent to provide the necessary physical and  cognitive assistance at discharge.    Reasons goals not met: Pt needs min to mod assist for anticipatory awareness as well as selective attention  Recommendation:  Patient will benefit from ongoing skilled OT services in home health setting to continue to advance functional skills in the area of BADL and Reduce care partner burden.  Feel pt will benefit from continued HHOT to further progress ADL functional level to supervision from the current min assist level.  Pt still demonstrates fluctuating attention as well as memory deficits and increased back pain, all affecting his ability to participate at times.  Feel he will continue to improve with HHOT in a familiar environment.    Equipment: No equipment provided  Reasons for discharge: treatment goals met and discharge from hospital  Patient/family agrees with progress made and goals achieved: Yes  OT Discharge Precautions/Restrictions  Precautions Precautions: Fall Precaution Comments: chest and ribs sore from CPR, chronic back pain Restrictions Weight Bearing Restrictions: No  Pain Pain Assessment Pain Scale: 0-10 Pain Score: 6  Pain Location: Back Pain Descriptors / Indicators: Aching Pain Intervention(s): Repositioned ADL  See Function Section of chart for details  Vision Baseline Vision/History: Wears glasses Wears Glasses: Reading only Patient Visual Report: No change from baseline Vision Assessment?: No apparent visual deficits Perception  Perception: Within Functional Limits Praxis Praxis: Intact Cognition Overall Cognitive Status: History of cognitive impairments - at baseline Arousal/Alertness: Awake/alert Orientation  Level: Oriented X4 Attention: Sustained Sustained Attention: Impaired Sustained Attention Impairment: Verbal basic;Functional basic Memory: Impaired Awareness: Impaired Awareness Impairment: Anticipatory impairment Problem Solving: Impaired Problem Solving Impairment: Functional  basic Safety/Judgment: Impaired Sensation Sensation Light Touch: Not tested Hot/Cold: Not tested Proprioception: Not tested Stereognosis: Not tested Coordination Gross Motor Movements are Fluid and Coordinated: No Fine Motor Movements are Fluid and Coordinated: No Coordination and Movement Description: Pt with decreased overall speed with FM and gross motor coordination.  He is able to complete selfcare tasks and use both UEs functionally for managing clothing with increased time, but exhibits difficulty with attempting to donn his gripper socks over his feet.   Motor  Motor Motor: Within Functional Limits Motor - Discharge Observations: generalized weakness and limitations secondary to pain. Mobility  Bed Mobility Bed Mobility: Supine to Sit;Sit to Supine Supine to Sit: Moderate Assistance - Patient 50-74% Sit to Supine: Moderate Assistance - Patient 50-74% Transfers Sit to Stand: Contact Guard/Touching assist Stand to Sit: Contact Guard/Touching assist  Trunk/Postural Assessment  Cervical Assessment Cervical Assessment: Exceptions to WFL(cervical flexion and protraction) Thoracic Assessment Thoracic Assessment: Exceptions to WFL(thoracic kyphosis) Lumbar Assessment Lumbar Assessment: Exceptions to WFL(posterior pelvic tilt) Postural Control Postural Control: Deficits on evaluation  Balance Balance Balance Assessed: Yes Static Sitting Balance Static Sitting - Balance Support: No upper extremity supported;Feet supported Static Sitting - Level of Assistance: 5: Stand by assistance Dynamic Sitting Balance Dynamic Sitting - Balance Support: Feet supported;No upper extremity supported Dynamic Sitting - Level of Assistance: 5: Stand by assistance Static Standing Balance Static Standing - Balance Support: Bilateral upper extremity supported;During functional activity Static Standing - Level of Assistance: 5: Stand by assistance Dynamic Standing Balance Dynamic Standing -  Balance Support: During functional activity Dynamic Standing - Level of Assistance: 4: Min assist Extremity/Trunk Assessment RUE Assessment RUE Assessment: Exceptions to [facility] Active Range of Motion (AROM) Comments: AROM shoulder flexion 0-120 degrees with all other joints AROM WFLS.  Strength 3+/5 throughout. LUE Assessment LUE Assessment: Exceptions to [facility] Active Range of Motion (AROM) Comments: AROM shoulder flexion 0-120 degrees with all other joints AROM WFLS.  Strength 3+/5 throughout.   See Function Navigator for Current Functional Status.  Clayson Riling OTR/L 01/17/2018, 4:39 PM

## 2018-01-17 NOTE — Progress Notes (Signed)
Physical Therapy Discharge Summary  Patient Details  Name: Christopher Baker MRN: [id number] Date of Birth: [birth year]  Today's Date: 01/17/2018 PT Individual Time: 1050-1210 PT Individual Time Calculation (min): 80 min    Patient has met 3 of 6 long term goals due to improved activity tolerance, improved balance and increased strength.  Patient to discharge at an ambulatory level [place].   Patient's care partner is independent to provide the necessary physical assistance at discharge.  Reasons goals not met: Pt continues to require min/modA for bed mobility as pain limiting factor. Pt able to perform supine to with with decreased assistance with bed rail, discussed with pt's wife obtaining portable bed rail and continue to work with HHPT. Pt also limited in ambulation distance by pain.   Recommendation:  Patient will benefit from ongoing skilled PT services in home health setting to continue to advance safe functional mobility, address ongoing impairments in balance, strength, gait, safety, transfers, and minimize fall risk.  Equipment: RW and WC  Reasons for discharge: treatment goals met and discharge from hospital  Patient/family agrees with progress made and goals achieved: Yes  PT Discharge Precautions/Restrictions   Vital Signs Therapy Vitals Temp: 98.1 F (36.7 C) Temp Source: Oral Pulse Rate: 65 Resp: 17 BP: (!) 143/54 Patient Position (if appropriate): Lying Oxygen Therapy SpO2: 100 % O2 Device: Room Air Pain Pain Assessment Pain Scale: 0-10 Pain Score: 6  Pain Type: Chronic pain Pain Location: Back Pain Descriptors / Indicators: Aching Pain Intervention(s): Repositioned Vision/Perception     Cognition Overall Cognitive Status: History of cognitive impairments - at baseline Arousal/Alertness: Awake/alert Orientation Level: Oriented X4 Attention: Sustained Sustained Attention: Impaired Sustained Attention Impairment: Verbal basic;Functional  basic Memory: Impaired Memory Impairment: Retrieval deficit;Storage deficit;Decreased recall of new information Awareness: Impaired Awareness Impairment: Anticipatory impairment Problem Solving: Impaired Problem Solving Impairment: Functional basic Executive Function: Sequencing;Organizing;Reasoning Reasoning: Impaired Reasoning Impairment: Functional basic Sequencing: Impaired Sequencing Impairment: Functional basic Organizing: Impaired Organizing Impairment: Functional basic Safety/Judgment: Impaired Sensation Sensation Light Touch: Impaired by gross assessment Proprioception: Impaired by gross assessment Coordination Gross Motor Movements are Fluid and Coordinated: No Fine Motor Movements are Fluid and Coordinated: No Motor  Motor Motor: Within Functional Limits  Mobility Bed Mobility Bed Mobility: Supine to Sit;Sit to Supine Supine to Sit: Moderate Assistance - Patient 50-74% Sit to Supine: Moderate Assistance - Patient 50-74% Transfers Transfers: Stand to Sit;Sit to Stand Sit to Stand: Contact Guard/Touching assist Stand to Sit: Contact Guard/Touching assist Transfer (Assistive device): Rolling walker Locomotion  Gait Ambulation: Yes Gait Assistance: Contact Guard/Touching assist Gait Distance (Feet): 45 Feet Assistive device: Rolling walker Gait Gait: Yes Gait Pattern: Impaired Gait Pattern: Decreased step length - right;Decreased step length - left;Narrow base of support;Poor foot clearance - left;Poor foot clearance - right Gait velocity: decreased Stairs / Additional Locomotion Stairs: Yes Stair Management Technique: One rail Right Number of Stairs: 3 Height of Stairs: 6 Curb: Nurse, mental health Mobility: Yes Wheelchair Assistance: Chartered loss adjuster: Both upper extremities Distance: 166f(per staff report)  Trunk/Postural Assessment  Cervical Assessment Cervical Assessment:  Exceptions to WFL(forward head) Thoracic Assessment Thoracic Assessment: Exceptions to WFL(rounded shoulders) Lumbar Assessment Lumbar Assessment: Exceptions to WFL(posterior pelvic tilt) Postural Control Postural Control: Deficits on evaluation  Balance Static Sitting Balance Static Sitting - Balance Support: No upper extremity supported;Feet supported Static Sitting - Level of Assistance: 5: Stand by assistance Dynamic Sitting Balance Dynamic Sitting - Balance Support: Feet supported;No upper extremity supported Dynamic Sitting - Level  of Assistance: 5: Stand by assistance Static Standing Balance Static Standing - Balance Support: Bilateral upper extremity supported;During functional activity Static Standing - Level of Assistance: 5: Stand by assistance Extremity Assessment      RLE Assessment RLE Assessment: Exceptions to [facility] RLE Strength Right Hip Flexion: 3/5 Right Knee Flexion: 4/5 Right Knee Extension: 4/5 Right Ankle Dorsiflexion: 3+/5 LLE Assessment LLE Assessment: Exceptions to [facility] LLE Strength Left Hip Flexion: 4+/5 Left Knee Flexion: 4+/5 Left Knee Extension: 4/5 Left Ankle Dorsiflexion: 4/5   See Function Navigator for Current Functional Status.  Christopher Baker 01/17/2018, 4:05 PM

## 2018-01-17 NOTE — Progress Notes (Signed)
Speech Language Pathology Discharge Summary  Patient Details  Name: Christopher Baker MRN: [id number] Date of Birth: 09-18-1933  Today's Date: 01/17/2018 SLP Individual Time: 1405-1440 SLP Individual Time Calculation (min): 35 min   Skilled Therapeutic Interventions:  Pt was seen for skilled ST targeting cognitive goals.  Pt was received in bed upon arrival with daughter at bedside.  Pt was awake, alert, and pleasantly interactive while daughter was present.  Daughter was updated regarding current goals and progress in therapies as well as memory compensatory strategies to be used in the home environment to maximize carryover of daily information.  All questions were answered to pt's and daughter's satisfaction at this time.  Daughter verified that someone will be with pt 24/7 and that pt will continue to have assistance for medication and financial management.  Once daughter left, pt was noted to become more fatigued and he could not tolerate anything close to an upright sitting posture before complaining of pain.  Session was completed entirely while pt was supine due to increased reports of pain with repositioning.  SLP administered the Cognistat to measure progress from initial evaluation.  Pt's scores on subtests were variable from [place] [facility] to severely impaired across all domains.  On average pt scored in the range of moderate cognitive impairment with deficits most evident for memory and attention.  Pt appeared to give minimal effort during evaluation which also impacted score.  SLP discussed ways to maximize cognitive remediation and compensation in the home environment with pt who verbalized understanding.  Pt was left in bed with call bell within reach.      Patient has met 3 of 3 long term goals.  Patient to discharge at [facility] level.  Reasons goals not met:     Clinical Impression/Discharge Summary:   Pt has made slow, functional gains while inpatient and is discharging having met 3 out of 3  short term goals.  Pt is currently min assist for tasks due to mild cognitive deficits in the setting of encephalopathy; however, his performance varies greatly due to pain and fatigue.  Pt and family education is complete at this time.  Pt has demonstrated improved attention to tasks and alertness.    Pt is discharging home with recommendations for ST follow up at next level of care.    Care Partner:  Caregiver Able to Provide Assistance: Yes  Type of Caregiver Assistance: Physical;Cognitive  Recommendation:  Home Health SLP;24 hour supervision/assistance;Outpatient SLP  Rationale for SLP Follow Up: Maximize cognitive function and independence;Reduce caregiver burden   Equipment: none recommended by SLP    Reasons for discharge: Discharged from hospital   Patient/Family Agrees with Progress Made and Goals Achieved: Yes   Function:  Eating Eating                 Cognition Comprehension Comprehension assist level: Understands basic 75 - 89% of the time/ requires cueing 10 - 24% of the time  Expression   Expression assist level: Expresses basic 90% of the time/requires cueing < 10% of the time.  Social Interaction Social Interaction assist level: Interacts appropriately 75 - 89% of the time - Needs redirection for appropriate language or to initiate interaction.  Problem Solving Problem solving assist level: Solves basic 50 - 74% of the time/requires cueing 25 - 49% of the time  Memory Memory assist level: Recognizes or recalls 50 - 74% of the time/requires cueing 25 - 49% of the time   Emilio Math 01/17/2018, 2:56 PM

## 2018-01-17 NOTE — Patient Outreach (Signed)
[facility]) Care Management  01/17/2018  Christopher Baker 1933-12-15 [id number]  [facility] CSW received [facility] Social Worker assist with navigating long term care insurance application process. Patient is still inpatient at this time. [facility] CSW completed initial outreach call to patient's spouse and was able to successfully reach her. HIPPA verifications received. [facility] CSW introduced self, reason for call of [facility] social work services. Family agreeable to social work involvement. Spouse reports that she contacted [employer] long term care insurance and was informed that they cannot open a case until patient has a set discharge date back home. Spouse now has a discharge date of 01/18/18 and [facility] CSW encouraged spouse to contact long term insurance and update them. Spouse agreeable to do so by this afternoon. Patient will need 24/7 care and spouse mentioned that their daughter will only be staying with them until Thursday to help out. HH will be involved as well post hospital discharge. [facility] CSW will follow up with family post hospital discharge and then will establish goals at that time.  Eula Fried, BSW, [email or web address], [email or web address] Phone: [phone] Fax: [phone]

## 2018-01-17 NOTE — Progress Notes (Signed)
Physical Therapy Session Note  Patient Details  Name: Christopher Baker MRN: [id number] Date of Birth: [name] 26, 1934  Today's Date: 01/17/2018 PT Individual Time: 1050-1210  PT individual time calculation (min): 80     Short Term Goals: Week 3:  PT Short Term Goal 1 (Week 3): STG =LTG due to ELOS  Skilled Therapeutic Interventions/Progress Updates: Pt presented in recliner with family present agreeable to therapy. Pt required extensive encouragement to participate in therapy. Performed sit to stand from recliner with increased time and minA. Pt ambulated approx 58ft to w/c with CGA. Pt transported to ortho gym for energy conservation and time management and participated in car transfer to SUV height with CGA and increased time. Pt required mod verbal cues for safety with RW during transfer. Performed ascending/descending stairs x 3 with single rail. Pt with heavy use of BUE to ascend. Discussed with wife and [place] safest manner to perform at home. Currently safest to use outside door vs garage as able to use R rail and have chair with armrests or rollator to sit on at landing. Pt then able to rest before continuing into house. Discussed with family not to use rollator at this time due to decreased stability. Pt then transported to ADL apt to perform bed mobility. Pt performed sit to supine with minA for BLE placement. Max multimodal cues for repositioning in bed and use of BUE to position trunk. Pt and wife then attempting supine to sit. Pt able to roll to R with minA from wife and max verbal cues. However pt unable to follow commands to use BUE to push from mattress. Pt perseverating on head hitting night stand and reaching for night stand. Christopher Baker intervened attempting HOH assist for use of BUE however pt attempting to hold onto Christopher Baker for support. Christopher Baker then provided modA for truncal support to perform supine to sit from flat surface. Pt transferred to w/c with minA from bed and Christopher Baker discussed with pt's family  alternatives to bed mobility. [name] looking at obtaining bed rail. Pt indicating need for bathroom. Pt transported to room and performed ambulatory transfer to toilet with Christopher Baker providing total assist for clothing management. Pt left at toilet verbalizing understanding of pulling call light once completed. Christopher Baker notified nurse and NT of pt's status.        Therapy Documentation Precautions:  Precautions Precautions: Fall Precaution Comments: chest and ribs sore from CPR, chronic back pain Restrictions Weight Bearing Restrictions: No Other Position/Activity Restrictions:   General:   Vital Signs: Therapy Vitals Temp: 98.1 F (36.7 C) Temp Source: Oral Pulse Rate: 65 Resp: 17 BP: (!) 143/54 Patient Position (if appropriate): Lying Oxygen Therapy SpO2: 100 % O2 Device: Room Air   See Function Navigator for Current Functional Status.   Therapy/Group: Individual Therapy  Christopher Baker  Christopher Baker, Christopher Baker  01/17/2018, 4:39 PM

## 2018-01-17 NOTE — Progress Notes (Signed)
[place] PHYSICAL MEDICINE & REHABILITATION     PROGRESS NOTE    Subjective/Complaints: Patient seen lying in bed this morning. He states he slept well overnight. He states he is looking forward to discharge tomorrow.  ROS: denies CP, SOB, nausea, vomiting, diarrhea.  Objective:  No results found. No results for input(s): WBC, HGB, HCT, PLT in the last 72 hours. Recent Labs    01/16/18 0518  NA 141  K 3.7  CL 107  GLUCOSE 115*  BUN 26*  CREATININE 1.28*  CALCIUM 9.5   CBG (last 3)  No results for input(s): GLUCAP in the last 72 hours.  Wt Readings from Last 3 Encounters:  01/17/18 66.7 kg (147 lb)  12/30/17 94.5 kg (208 lb 5.4 oz)  12/09/17 98.9 kg (218 lb)     Intake/Output Summary (Last 24 hours) at 01/17/2018 0847 Last data filed at 01/16/2018 2300 Gross per 24 hour  Intake 240 ml  Output -  Net 240 ml    Vital Signs: Blood pressure 139/81, pulse (!) 57, temperature 97.9 F (36.6 C), temperature source Oral, resp. rate 12, height 6\' 3"  (1.905 m), weight 66.7 kg (147 lb), SpO2 98 %. Physical Exam:  Constitutional: No distress . Vital signs reviewed. HENT: Normocephalic.  Atraumatic. Eyes: EOMI. No discharge. Cardiovascular: RRR. No JVD. Respiratory: CTA bilaterally. Normal effort. GI: BS +. Non-distended. Musc: No edema or tenderness in extremities. Neurological:  Alert and oriented x2 Motor: B/l UE 4/5 prox to distal.  (unchanged) LLE: 4/5 proximal to distal  RLE: 4-/5 proximal to distal (unchanged) Psychiatric: pleasant and cooperative  Assessment/Plan: 1. Functional deficits secondary to urosepsis and subsequent encephalopathy and debility which require  interdisciplinary therapy in a comprehensive inpatient rehab setting. Physiatrist is providing close team supervision and 24 hour management of active medical problems listed below. Physiatrist and rehab team continue to assess barriers to discharge/monitor patient progress toward functional and  medical goals.  Function:  Bathing Bathing position Bathing activity did not occur: Refused Position: Shower  Bathing parts Body parts bathed by patient: Right arm, Left arm, Chest, Abdomen, Front perineal area, Right upper leg, Left upper leg, Right lower leg, Left lower leg Body parts bathed by helper: Back  Bathing assist Assist Level: Touching or steadying assistance(Pt > 75%)      Upper Body Dressing/Undressing Upper body dressing   What is the patient wearing?: Pull over shirt/dress     Pull over shirt/dress - Perfomed by patient: Thread/unthread right sleeve, Thread/unthread left sleeve, Put head through opening, Pull shirt over trunk Pull over shirt/dress - Perfomed by helper: Pull shirt over trunk        Upper body assist Assist Level: Supervision or verbal cues      Lower Body Dressing/Undressing Lower body dressing   What is the patient wearing?: Ted Hose, Non-skid slipper socks, Pants Underwear - Performed by patient: Thread/unthread right underwear leg, Thread/unthread left underwear leg, Pull underwear up/down Underwear - Performed by helper: Thread/unthread right underwear leg, Thread/unthread left underwear leg Pants- Performed by patient: Thread/unthread left pants leg, Thread/unthread right pants leg, Pull pants up/down Pants- Performed by helper: Thread/unthread right pants leg, Pull pants up/down Non-skid slipper socks- Performed by patient: Don/doff right sock, Don/doff left sock Non-skid slipper socks- Performed by helper: Don/doff right sock, Don/doff left sock             TED Hose - Performed by patient: Don/doff right TED hose, Don/doff left TED hose TED Hose - Performed by helper: Don/doff  right TED hose, Don/doff left TED hose  Lower body assist Assist for lower body dressing: Assistive device Assistive Device Comment: reacher + sock aide    Toileting Toileting Toileting activity did not occur: No continent bowel/bladder event Toileting steps  completed by patient: Adjust clothing prior to toileting, Performs perineal hygiene, Adjust clothing after toileting Toileting steps completed by helper: Adjust clothing prior to toileting, Performs perineal hygiene, Adjust clothing after toileting Toileting Assistive Devices: Other (comment)(stedy)  Toileting assist Assist level: Two helpers   Transfers Chair/bed transfer Chair/bed transfer activity did not occur: N/A Chair/bed transfer method: Stand pivot Chair/bed transfer assist level: Touching or steadying assistance (Pt > 75%) Chair/bed transfer assistive device: Environmental manager lift: Ecologist     Max distance: 70ft Assist level: Touching or steadying assistance (Pt > 75%)   Wheelchair   Type: Manual Max wheelchair distance: 136ft Assist Level: Supervision or verbal cues  Cognition Comprehension Comprehension assist level: Understands basic 50 - 74% of the time/ requires cueing 25 - 49% of the time  Expression Expression assist level: Expresses basic 50 - 74% of the time/requires cueing 25 - 49% of the time. Needs to repeat parts of sentences.  Social Interaction Social Interaction assist level: Interacts appropriately 50 - 74% of the time - May be physically or verbally inappropriate.  Problem Solving Problem solving assist level: Solves basic 50 - 74% of the time/requires cueing 25 - 49% of the time  Memory Memory assist level: Recognizes or recalls 50 - 74% of the time/requires cueing 25 - 49% of the time   Medical Problem List and Plan:  1. Decreased functional mobility secondary to encephalopathy due to urosepsis/multi-medical   Cont CIR   D/c tomorrow  Will see patient for transitional care management in 1-2 weeks post-discharge 2. DVT Prophylaxis/Anticoagulation: Eliquis. Monitor for any bleeding episodes  3. Pain Management: Lidoderm patch, topicals  Lumbar post laminectomy syndrome  avoiding narcotics due to sedation and confusion  schedule  tylenol 650mg  qid   Kpad ordered with improvement  Improved 4. Mood: Aricept 10 mg nightly, Cymbalta 30 mg nightly  5. Neuropsych: This patient is not capable of making decisions on his own behalf.  6. Skin/Wound Care: Stage I sacral pressure injury. Routine skin checks  7. Fluids/Electrolytes/Nutrition: Routine in and outs  Encourage PO 8. ID. IV Rocephin completed 12/29/2017.  9. V. tach cardiac arrest with history of PAF/pacemaker. Patient currently off Tikosyn until follow-up outpatient with cardiology services.  10. Chronic systolic congestive heart failure. Monitor for any signs of fluid overload.   Lasix 40 mg   Filed Weights   01/15/18 0504 01/16/18 0453 01/17/18 0600  Weight: 82.1 kg (181 lb) 55.3 kg (122 lb) 66.7 kg (147 lb)    ?Reliability on 7/30 11. Psoriatic arthritis. Methotrexate and chronic prednisone    Resumed medications: prednisone 5mg  daily and MTX 15mg  every Monday 12. Hypertension. Toprol-XL 25 mg daily. Monitor with increased mobility    Vitals:   01/16/18 2055 01/17/18 0600  BP: (!) 141/58 139/81  Pulse: 67 (!) 57  Resp: 18 12  Temp: 97.9 F (36.6 C)   SpO2: 97% 98%   Relatively controlled on 7/30 13. Hyperlipidemia. Lipitor  14. CKD stage III.   Creatinine 1.28 on 7/29   continue to monitor 15. History of prostate cancer status post radiation. Patient on Flomax prior to admission 0.4 mg daily. Renal ultrasound no hydronephrosis. Follow-up outpatient Dr. Amalia Hailey  16. Acute on chronic anemia. Continue iron  supplement.   Hemoglobin 9.5 on 7/25 17. Hypoalbuminemia  Supplement initiated 18. Leukocytosis: Resolved  WBCs 10.4 on 7/25  Afebrile  UA -/equivocal, urine culture is negative  No active signs of infection  19.  Hx RIght parietal and BG infarcts- on eliquis  LOS (Days) 18 A FACE TO FACE EVALUATION WAS PERFORMED  Merrell Rettinger Lorie Phenix, MD 01/17/2018 8:47 AM

## 2018-01-18 MED ORDER — FERROUS SULFATE 325 (65 FE) MG PO TABS: 325.0000 mg | ORAL_TABLET | Freq: Two times a day (BID) | ORAL | 0 refills | Status: DC

## 2018-01-18 MED ORDER — DULOXETINE HCL 30 MG PO CPEP: 30.0000 mg | ORAL_CAPSULE | Freq: Every day | ORAL | 0 refills | Status: AC

## 2018-01-18 MED ORDER — METHOTREXATE SODIUM 15 MG PO TABS: 15.0000 mg | ORAL_TABLET | ORAL | 0 refills | Status: DC

## 2018-01-18 MED ORDER — FOLIC ACID 1 MG PO TABS: 1.0000 mg | ORAL_TABLET | Freq: Every day | ORAL | 0 refills | Status: DC

## 2018-01-18 MED ORDER — GERHARDT'S BUTT CREAM: 1.0000 "application " | TOPICAL_CREAM | Freq: Four times a day (QID) | CUTANEOUS | 0 refills | Status: AC

## 2018-01-18 MED ORDER — METOPROLOL SUCCINATE ER 25 MG PO TB24: 25.0000 mg | ORAL_TABLET | Freq: Every day | ORAL | 0 refills | Status: AC

## 2018-01-18 MED ORDER — APIXABAN 5 MG PO TABS: 5.0000 mg | ORAL_TABLET | Freq: Two times a day (BID) | ORAL | 1 refills | Status: DC

## 2018-01-18 MED ORDER — DONEPEZIL HCL 10 MG PO TABS: 10.0000 mg | ORAL_TABLET | Freq: Every day | ORAL | 1 refills | Status: AC

## 2018-01-18 MED ORDER — ATORVASTATIN CALCIUM 20 MG PO TABS: 20.0000 mg | ORAL_TABLET | Freq: Every day | ORAL | 0 refills | Status: DC

## 2018-01-18 MED ORDER — TAMSULOSIN HCL 0.4 MG PO CAPS: 0.4000 mg | ORAL_CAPSULE | Freq: Every day | ORAL | 0 refills | Status: AC

## 2018-01-18 MED ORDER — FUROSEMIDE 40 MG PO TABS: 40.0000 mg | ORAL_TABLET | Freq: Every day | ORAL | 0 refills | Status: DC

## 2018-01-18 MED ORDER — PREDNISONE 5 MG PO TABS: 5.0000 mg | ORAL_TABLET | Freq: Every day | ORAL | 0 refills | Status: AC

## 2018-01-18 MED ORDER — LIDOCAINE 5 % EX PTCH: 1.0000 | MEDICATED_PATCH | CUTANEOUS | 0 refills | Status: AC

## 2018-01-18 NOTE — Progress Notes (Signed)
Social Work  Discharge Note  The overall goal for the admission was met for:   Discharge location: Yes - home with wife who can provide 24/7 assistance, however, she is also pursing arrangement of CNA services via her LTC insurance.  Length of Stay: Yes - 19 days  Discharge activity level: Yes - minimal assistance overall  Home/community participation: Yes  Services provided included: MD, RD, PT, OT, SLP, RN, CM, Pharmacy and SW and Neuropsychology  Financial Services: Other: [employer]  Follow-up services arranged: Home Health: RN, PT, OT, ST via [facility], Outpatient: 20x18 lightweight w/c, cushion, rolling walker via [facility] and Patient/Family has no preference for HH/DME agencies  Comments (or additional information):  Patient/Family verbalized understanding of follow-up arrangements: Yes  Individual responsible for coordination of the follow-up plan: pt/wife  Confirmed correct DME delivered: Dewane Timson 01/18/2018    Kein Carlberg

## 2018-01-18 NOTE — Discharge Instructions (Signed)
Inpatient Rehab Discharge Instructions  Christopher Baker Discharge date and time: No discharge date for patient encounter.   Activities/Precautions/ Functional Status: Activity: activity as tolerated Diet: regular diet Wound Care: none needed Functional status:  ___ No restrictions     ___ Walk up steps independently ___ 24/7 supervision/assistance   ___ Walk up steps with assistance ___ Intermittent supervision/assistance  ___ Bathe/dress independently ___ Walk with walker     _x__ Bathe/dress with assistance ___ Walk Independently    ___ Shower independently ___ Walk with assistance    ___ Shower with assistance ___ No alcohol     ___ Return to work/school ________     COMMUNITY REFERRALS UPON DISCHARGE:    Home Health:   PT     OT     ST    RN                      Agency:  [facility] Phone: [phone]   Medical Equipment/Items Ordered: wheelchair, cushion, walker                                                     Agency/Supplier:  [facility] @ [phone]      Special Instructions: Follow-up rheumatology services   My questions have been answered and I understand these instructions. I will adhere to these goals and the provided educational materials after my discharge from the hospital.  Patient/Caregiver Signature _______________________________ Date __________  Clinician Signature _______________________________________ Date __________  Please bring this form and your medication list with you to all your follow-up doctor's appointments. Information on my medicine - ELIQUIS (apixaban)  Why was Eliquis prescribed for you? Eliquis was prescribed for you to reduce the risk of a blood clot forming that can cause a stroke if you have a medical condition called atrial fibrillation (a type of irregular heartbeat).  What do You need to know about Eliquis ? Take your Eliquis TWICE DAILY - one tablet in the morning and one tablet in the evening with or  without food. If you have difficulty swallowing the tablet whole please discuss with your pharmacist how to take the medication safely.  Take Eliquis exactly as prescribed by your doctor and DO NOT stop taking Eliquis without talking to the doctor who prescribed the medication.  Stopping may increase your risk of developing a stroke.  Refill your prescription before you run out.  After discharge, you should have regular check-up appointments with your healthcare provider that is prescribing your Eliquis.  In the future your dose may need to be changed if your kidney function or weight changes by a significant amount or as you get older.  What do you do if you miss a dose? If you miss a dose, take it as soon as you remember on the same day and resume taking twice daily.  Do not take more than one dose of ELIQUIS at the same time to make up a missed dose.  Important Safety Information A possible side effect of Eliquis is bleeding. You should call your healthcare provider right away if you experience any of the following: ? Bleeding from an injury or your nose that does not stop. ? Unusual colored urine (red or dark brown) or unusual colored stools (red or black). ? Unusual bruising for unknown reasons. ?  A serious fall or if you hit your head (even if there is no bleeding).  Some medicines may interact with Eliquis and might increase your risk of bleeding or clotting while on Eliquis. To help avoid this, consult your healthcare provider or pharmacist prior to using any new prescription or non-prescription medications, including herbals, vitamins, non-steroidal anti-inflammatory drugs (NSAIDs) and supplements.  This website has more information on Eliquis (apixaban): http://www.eliquis.com/eliquis/home

## 2018-01-18 NOTE — Progress Notes (Signed)
Discharge instructions per PA Dan Angiulli. Will be discharged home per wheelchair with wife and daughter. Personal belonging with family. More alert and cooperative than previously.

## 2018-01-19 ENCOUNTER — Other Ambulatory Visit: Payer: Self-pay

## 2018-01-19 ENCOUNTER — Inpatient Hospital Stay (HOSPITAL_COMMUNITY)
Admission: EM | Admit: 2018-01-19 | Discharge: 2018-01-23 | DRG: 948 | Disposition: A | Discharge status: Hospice | Payer: PPO | Attending: Internal Medicine | Admitting: Internal Medicine

## 2018-01-19 ENCOUNTER — Other Ambulatory Visit: Payer: Self-pay | Admitting: Licensed Clinical Social Worker

## 2018-01-19 ENCOUNTER — Emergency Department (HOSPITAL_COMMUNITY): Payer: PPO

## 2018-01-19 ENCOUNTER — Ambulatory Visit: Payer: Self-pay

## 2018-01-19 DIAGNOSIS — G2581 Restless legs syndrome: Secondary | ICD-10-CM | POA: Diagnosis not present

## 2018-01-19 DIAGNOSIS — R531 Weakness: Principal | ICD-10-CM

## 2018-01-19 DIAGNOSIS — M818 Other osteoporosis without current pathological fracture: Secondary | ICD-10-CM | POA: Diagnosis present

## 2018-01-19 DIAGNOSIS — G8929 Other chronic pain: Secondary | ICD-10-CM | POA: Diagnosis not present

## 2018-01-19 DIAGNOSIS — Z515 Encounter for palliative care: Secondary | ICD-10-CM

## 2018-01-19 DIAGNOSIS — Z96651 Presence of right artificial knee joint: Secondary | ICD-10-CM | POA: Diagnosis present

## 2018-01-19 DIAGNOSIS — Z961 Presence of intraocular lens: Secondary | ICD-10-CM | POA: Diagnosis present

## 2018-01-19 DIAGNOSIS — E1122 Type 2 diabetes mellitus with diabetic chronic kidney disease: Secondary | ICD-10-CM | POA: Diagnosis present

## 2018-01-19 DIAGNOSIS — Z9842 Cataract extraction status, left eye: Secondary | ICD-10-CM

## 2018-01-19 DIAGNOSIS — G4700 Insomnia, unspecified: Secondary | ICD-10-CM | POA: Diagnosis present

## 2018-01-19 DIAGNOSIS — Z9049 Acquired absence of other specified parts of digestive tract: Secondary | ICD-10-CM

## 2018-01-19 DIAGNOSIS — Z79899 Other long term (current) drug therapy: Secondary | ICD-10-CM

## 2018-01-19 DIAGNOSIS — I130 Hypertensive heart and chronic kidney disease with heart failure and stage 1 through stage 4 chronic kidney disease, or unspecified chronic kidney disease: Secondary | ICD-10-CM | POA: Diagnosis present

## 2018-01-19 DIAGNOSIS — R001 Bradycardia, unspecified: Secondary | ICD-10-CM | POA: Diagnosis present

## 2018-01-19 DIAGNOSIS — I8390 Asymptomatic varicose veins of unspecified lower extremity: Secondary | ICD-10-CM | POA: Diagnosis present

## 2018-01-19 DIAGNOSIS — Z7401 Bed confinement status: Secondary | ICD-10-CM | POA: Diagnosis not present

## 2018-01-19 DIAGNOSIS — I482 Chronic atrial fibrillation, unspecified: Secondary | ICD-10-CM | POA: Diagnosis present

## 2018-01-19 DIAGNOSIS — E611 Iron deficiency: Secondary | ICD-10-CM | POA: Diagnosis present

## 2018-01-19 DIAGNOSIS — F0390 Unspecified dementia without behavioral disturbance: Secondary | ICD-10-CM | POA: Diagnosis not present

## 2018-01-19 DIAGNOSIS — Z87440 Personal history of urinary (tract) infections: Secondary | ICD-10-CM

## 2018-01-19 DIAGNOSIS — M19042 Primary osteoarthritis, left hand: Secondary | ICD-10-CM | POA: Diagnosis present

## 2018-01-19 DIAGNOSIS — R079 Chest pain, unspecified: Secondary | ICD-10-CM | POA: Diagnosis not present

## 2018-01-19 DIAGNOSIS — I5042 Chronic combined systolic (congestive) and diastolic (congestive) heart failure: Secondary | ICD-10-CM | POA: Diagnosis not present

## 2018-01-19 DIAGNOSIS — R310 Gross hematuria: Secondary | ICD-10-CM

## 2018-01-19 DIAGNOSIS — Z823 Family history of stroke: Secondary | ICD-10-CM

## 2018-01-19 DIAGNOSIS — M1611 Unilateral primary osteoarthritis, right hip: Secondary | ICD-10-CM | POA: Diagnosis present

## 2018-01-19 DIAGNOSIS — Z8546 Personal history of malignant neoplasm of prostate: Secondary | ICD-10-CM

## 2018-01-19 DIAGNOSIS — E785 Hyperlipidemia, unspecified: Secondary | ICD-10-CM | POA: Diagnosis present

## 2018-01-19 DIAGNOSIS — Z7952 Long term (current) use of systemic steroids: Secondary | ICD-10-CM

## 2018-01-19 DIAGNOSIS — Z7189 Other specified counseling: Secondary | ICD-10-CM | POA: Diagnosis not present

## 2018-01-19 DIAGNOSIS — Z885 Allergy status to narcotic agent status: Secondary | ICD-10-CM

## 2018-01-19 DIAGNOSIS — Z8673 Personal history of transient ischemic attack (TIA), and cerebral infarction without residual deficits: Secondary | ICD-10-CM

## 2018-01-19 DIAGNOSIS — Z7901 Long term (current) use of anticoagulants: Secondary | ICD-10-CM

## 2018-01-19 DIAGNOSIS — R451 Restlessness and agitation: Secondary | ICD-10-CM | POA: Diagnosis not present

## 2018-01-19 DIAGNOSIS — I471 Supraventricular tachycardia: Secondary | ICD-10-CM | POA: Diagnosis not present

## 2018-01-19 DIAGNOSIS — Z96611 Presence of right artificial shoulder joint: Secondary | ICD-10-CM | POA: Diagnosis present

## 2018-01-19 DIAGNOSIS — D638 Anemia in other chronic diseases classified elsewhere: Secondary | ICD-10-CM | POA: Diagnosis present

## 2018-01-19 DIAGNOSIS — R0602 Shortness of breath: Secondary | ICD-10-CM

## 2018-01-19 DIAGNOSIS — G4733 Obstructive sleep apnea (adult) (pediatric): Secondary | ICD-10-CM | POA: Diagnosis present

## 2018-01-19 DIAGNOSIS — M109 Gout, unspecified: Secondary | ICD-10-CM | POA: Diagnosis present

## 2018-01-19 DIAGNOSIS — F329 Major depressive disorder, single episode, unspecified: Secondary | ICD-10-CM | POA: Diagnosis not present

## 2018-01-19 DIAGNOSIS — W07XXXA Fall from chair, initial encounter: Secondary | ICD-10-CM | POA: Diagnosis present

## 2018-01-19 DIAGNOSIS — N390 Urinary tract infection, site not specified: Secondary | ICD-10-CM | POA: Diagnosis present

## 2018-01-19 DIAGNOSIS — N183 Chronic kidney disease, stage 3 (moderate): Secondary | ICD-10-CM | POA: Diagnosis not present

## 2018-01-19 DIAGNOSIS — R404 Transient alteration of awareness: Secondary | ICD-10-CM | POA: Diagnosis not present

## 2018-01-19 DIAGNOSIS — Z888 Allergy status to other drugs, medicaments and biological substances status: Secondary | ICD-10-CM

## 2018-01-19 DIAGNOSIS — R410 Disorientation, unspecified: Secondary | ICD-10-CM | POA: Diagnosis not present

## 2018-01-19 DIAGNOSIS — Z950 Presence of cardiac pacemaker: Secondary | ICD-10-CM | POA: Diagnosis present

## 2018-01-19 DIAGNOSIS — Z91013 Allergy to seafood: Secondary | ICD-10-CM | POA: Diagnosis not present

## 2018-01-19 DIAGNOSIS — I252 Old myocardial infarction: Secondary | ICD-10-CM

## 2018-01-19 DIAGNOSIS — I959 Hypotension, unspecified: Secondary | ICD-10-CM | POA: Diagnosis not present

## 2018-01-19 DIAGNOSIS — K219 Gastro-esophageal reflux disease without esophagitis: Secondary | ICD-10-CM | POA: Diagnosis present

## 2018-01-19 DIAGNOSIS — Z881 Allergy status to other antibiotic agents status: Secondary | ICD-10-CM | POA: Diagnosis not present

## 2018-01-19 DIAGNOSIS — Z87891 Personal history of nicotine dependence: Secondary | ICD-10-CM

## 2018-01-19 DIAGNOSIS — E860 Dehydration: Secondary | ICD-10-CM | POA: Diagnosis present

## 2018-01-19 DIAGNOSIS — F419 Anxiety disorder, unspecified: Secondary | ICD-10-CM | POA: Diagnosis present

## 2018-01-19 DIAGNOSIS — I5032 Chronic diastolic (congestive) heart failure: Secondary | ICD-10-CM | POA: Diagnosis present

## 2018-01-19 DIAGNOSIS — E538 Deficiency of other specified B group vitamins: Secondary | ICD-10-CM | POA: Diagnosis present

## 2018-01-19 DIAGNOSIS — Z8674 Personal history of sudden cardiac arrest: Secondary | ICD-10-CM

## 2018-01-19 DIAGNOSIS — I480 Paroxysmal atrial fibrillation: Secondary | ICD-10-CM | POA: Diagnosis not present

## 2018-01-19 DIAGNOSIS — N281 Cyst of kidney, acquired: Secondary | ICD-10-CM | POA: Diagnosis not present

## 2018-01-19 DIAGNOSIS — Z981 Arthrodesis status: Secondary | ICD-10-CM

## 2018-01-19 DIAGNOSIS — I10 Essential (primary) hypertension: Secondary | ICD-10-CM | POA: Diagnosis present

## 2018-01-19 DIAGNOSIS — Z9841 Cataract extraction status, right eye: Secondary | ICD-10-CM

## 2018-01-19 DIAGNOSIS — M2550 Pain in unspecified joint: Secondary | ICD-10-CM | POA: Diagnosis not present

## 2018-01-19 DIAGNOSIS — M19041 Primary osteoarthritis, right hand: Secondary | ICD-10-CM | POA: Diagnosis present

## 2018-01-19 DIAGNOSIS — L4050 Arthropathic psoriasis, unspecified: Secondary | ICD-10-CM | POA: Diagnosis present

## 2018-01-19 DIAGNOSIS — M545 Low back pain: Secondary | ICD-10-CM | POA: Diagnosis not present

## 2018-01-19 DIAGNOSIS — I6523 Occlusion and stenosis of bilateral carotid arteries: Secondary | ICD-10-CM | POA: Diagnosis present

## 2018-01-19 DIAGNOSIS — Z66 Do not resuscitate: Secondary | ICD-10-CM | POA: Diagnosis present

## 2018-01-19 LAB — CBC WITH DIFFERENTIAL/PLATELET
ABS IMMATURE GRANULOCYTES: 0.1 10*3/uL (ref 0.0–0.1)
BASOS PCT: 0 %
Basophils Absolute: 0 10*3/uL (ref 0.0–0.1)
EOS PCT: 0 %
Eosinophils Absolute: 0 10*3/uL (ref 0.0–0.7)
HCT: 32.1 % — ABNORMAL LOW (ref 39.0–52.0)
HEMOGLOBIN: 9.5 g/dL — AB (ref 13.0–17.0)
Immature Granulocytes: 1 %
Lymphocytes Relative: 4 %
Lymphs Abs: 0.6 10*3/uL — ABNORMAL LOW (ref 0.7–4.0)
MCH: 26.9 pg (ref 26.0–34.0)
MCHC: 29.6 g/dL — AB (ref 30.0–36.0)
MCV: 90.9 fL (ref 78.0–100.0)
MONO ABS: 0.9 10*3/uL (ref 0.1–1.0)
MONOS PCT: 6 %
NEUTROS ABS: 12.5 10*3/uL — AB (ref 1.7–7.7)
Neutrophils Relative %: 89 %
PLATELETS: 303 10*3/uL (ref 150–400)
RBC: 3.53 MIL/uL — ABNORMAL LOW (ref 4.22–5.81)
RDW: 16.1 % — ABNORMAL HIGH (ref 11.5–15.5)
WBC: 14.2 10*3/uL — ABNORMAL HIGH (ref 4.0–10.5)

## 2018-01-19 LAB — TROPONIN I
TROPONIN I: 0.05 ng/mL — AB (ref <0.03)
Troponin I: 0.05 ng/mL (ref <0.03)
Troponin I: 0.05 ng/mL (ref <0.03)

## 2018-01-19 LAB — URINALYSIS, ROUTINE W REFLEX MICROSCOPIC
BILIRUBIN URINE: NEGATIVE
GLUCOSE, UA: NEGATIVE mg/dL
Ketones, ur: NEGATIVE mg/dL
NITRITE: NEGATIVE
PROTEIN: 100 mg/dL — AB
Specific Gravity, Urine: 1.018 (ref 1.005–1.030)
pH: 5 (ref 5.0–8.0)

## 2018-01-19 LAB — BRAIN NATRIURETIC PEPTIDE: B Natriuretic Peptide: 217.6 pg/mL — ABNORMAL HIGH (ref 0.0–100.0)

## 2018-01-19 LAB — CK: Total CK: 25 U/L — ABNORMAL LOW (ref 49–397)

## 2018-01-19 LAB — COMPREHENSIVE METABOLIC PANEL
ALT: 36 U/L (ref 0–44)
AST: 29 U/L (ref 15–41)
Albumin: 2.4 g/dL — ABNORMAL LOW (ref 3.5–5.0)
Alkaline Phosphatase: 123 U/L (ref 38–126)
Anion gap: 7 (ref 5–15)
BUN: 30 mg/dL — AB (ref 8–23)
CHLORIDE: 109 mmol/L (ref 98–111)
CO2: 27 mmol/L (ref 22–32)
Calcium: 9.5 mg/dL (ref 8.9–10.3)
Creatinine, Ser: 1.5 mg/dL — ABNORMAL HIGH (ref 0.61–1.24)
GFR calc Af Amer: 48 mL/min — ABNORMAL LOW (ref >60)
GFR calc non Af Amer: 42 mL/min — ABNORMAL LOW (ref >60)
GLUCOSE: 101 mg/dL — AB (ref 70–99)
Potassium: 4 mmol/L (ref 3.5–5.1)
Sodium: 143 mmol/L (ref 135–145)
Total Bilirubin: 0.6 mg/dL (ref 0.3–1.2)
Total Protein: 5.5 g/dL — ABNORMAL LOW (ref 6.5–8.1)

## 2018-01-19 MED ORDER — ACETAMINOPHEN 500 MG PO TABS
1000.0000 mg | ORAL_TABLET | Freq: Two times a day (BID) | ORAL | Status: DC
  Administered 2018-01-19 – 2018-01-23 (×8): 1000 mg via ORAL
  Filled 2018-01-19 (×8): qty 2

## 2018-01-19 MED ORDER — VITAMIN D (ERGOCALCIFEROL) 1.25 MG (50000 UNIT) PO CAPS
50000.0000 [IU] | ORAL_CAPSULE | ORAL | Status: DC
  Administered 2018-01-22: 50000 [IU] via ORAL
  Filled 2018-01-19: qty 1

## 2018-01-19 MED ORDER — FOLIC ACID 1 MG PO TABS
1.0000 mg | ORAL_TABLET | Freq: Every day | ORAL | Status: DC
  Administered 2018-01-19 – 2018-01-22 (×4): 1 mg via ORAL
  Filled 2018-01-19 (×4): qty 1

## 2018-01-19 MED ORDER — FUROSEMIDE 40 MG PO TABS
40.0000 mg | ORAL_TABLET | Freq: Every day | ORAL | Status: DC
  Administered 2018-01-19 – 2018-01-20 (×2): 40 mg via ORAL
  Filled 2018-01-19 (×2): qty 1

## 2018-01-19 MED ORDER — DONEPEZIL HCL 10 MG PO TABS
10.0000 mg | ORAL_TABLET | Freq: Every day | ORAL | Status: DC
  Administered 2018-01-19 – 2018-01-22 (×4): 10 mg via ORAL
  Filled 2018-01-19 (×4): qty 1

## 2018-01-19 MED ORDER — SODIUM CHLORIDE 0.9 % IV SOLN: 1.0000 g | Freq: Once | INTRAVENOUS | Status: DC

## 2018-01-19 MED ORDER — CYANOCOBALAMIN 1000 MCG/ML IJ SOLN
1000.0000 ug | INTRAMUSCULAR | Status: DC
  Administered 2018-01-20: 1000 ug via INTRAMUSCULAR
  Filled 2018-01-19: qty 1

## 2018-01-19 MED ORDER — METOPROLOL SUCCINATE ER 25 MG PO TB24
25.0000 mg | ORAL_TABLET | Freq: Every day | ORAL | Status: DC
  Administered 2018-01-19 – 2018-01-22 (×5): 25 mg via ORAL
  Filled 2018-01-19 (×4): qty 1

## 2018-01-19 MED ORDER — APIXABAN 5 MG PO TABS
5.0000 mg | ORAL_TABLET | Freq: Two times a day (BID) | ORAL | Status: DC
  Administered 2018-01-19 – 2018-01-22 (×6): 5 mg via ORAL
  Filled 2018-01-19 (×6): qty 1

## 2018-01-19 MED ORDER — GERHARDT'S BUTT CREAM
1.0000 "application " | TOPICAL_CREAM | Freq: Four times a day (QID) | CUTANEOUS | Status: DC
  Administered 2018-01-20 – 2018-01-23 (×13): 1 via TOPICAL
  Filled 2018-01-19 (×4): qty 1

## 2018-01-19 MED ORDER — SODIUM CHLORIDE 0.9 % IV SOLN
1.0000 g | INTRAVENOUS | Status: DC
  Administered 2018-01-19 – 2018-01-22 (×4): 1 g via INTRAVENOUS
  Filled 2018-01-19 (×5): qty 10

## 2018-01-19 MED ORDER — SODIUM CHLORIDE 0.9% FLUSH: 3.0000 mL | INTRAVENOUS | Status: DC | PRN

## 2018-01-19 MED ORDER — ATORVASTATIN CALCIUM 20 MG PO TABS
20.0000 mg | ORAL_TABLET | Freq: Every day | ORAL | Status: DC
  Administered 2018-01-19 – 2018-01-23 (×5): 20 mg via ORAL
  Filled 2018-01-19 (×5): qty 1

## 2018-01-19 MED ORDER — EZETIMIBE 10 MG PO TABS
10.0000 mg | ORAL_TABLET | Freq: Every day | ORAL | Status: DC
  Administered 2018-01-19 – 2018-01-23 (×5): 10 mg via ORAL
  Filled 2018-01-19 (×6): qty 1

## 2018-01-19 MED ORDER — SODIUM CHLORIDE 0.9 % IV SOLN
250.0000 mL | INTRAVENOUS | Status: DC | PRN
  Administered 2018-01-20: 22:00:00 via INTRAVENOUS
  Administered 2018-01-21: 100 mL via INTRAVENOUS

## 2018-01-19 MED ORDER — DULOXETINE HCL 30 MG PO CPEP
30.0000 mg | ORAL_CAPSULE | Freq: Every day | ORAL | Status: DC
  Administered 2018-01-19 – 2018-01-22 (×4): 30 mg via ORAL
  Filled 2018-01-19 (×4): qty 1

## 2018-01-19 MED ORDER — PREDNISONE 5 MG PO TABS
5.0000 mg | ORAL_TABLET | Freq: Every day | ORAL | Status: DC
  Administered 2018-01-20 – 2018-01-23 (×4): 5 mg via ORAL
  Filled 2018-01-19 (×4): qty 1

## 2018-01-19 MED ORDER — TAMSULOSIN HCL 0.4 MG PO CAPS
0.4000 mg | ORAL_CAPSULE | Freq: Every day | ORAL | Status: DC
  Administered 2018-01-19 – 2018-01-23 (×5): 0.4 mg via ORAL
  Filled 2018-01-19 (×5): qty 1

## 2018-01-19 MED ORDER — POTASSIUM CHLORIDE CRYS ER 20 MEQ PO TBCR
20.0000 meq | EXTENDED_RELEASE_TABLET | Freq: Every day | ORAL | Status: DC
  Administered 2018-01-19 – 2018-01-23 (×5): 20 meq via ORAL
  Filled 2018-01-19 (×5): qty 1

## 2018-01-19 MED ORDER — SODIUM CHLORIDE 0.9 % IV BOLUS
500.0000 mL | Freq: Once | INTRAVENOUS | Status: AC
  Administered 2018-01-19: 500 mL via INTRAVENOUS

## 2018-01-19 MED ORDER — LACTATED RINGERS IV BOLUS
1000.0000 mL | Freq: Once | INTRAVENOUS | Status: AC
  Administered 2018-01-19: 1000 mL via INTRAVENOUS

## 2018-01-19 MED ORDER — SODIUM CHLORIDE 0.9% FLUSH
3.0000 mL | Freq: Two times a day (BID) | INTRAVENOUS | Status: DC
  Administered 2018-01-19 – 2018-01-23 (×8): 3 mL via INTRAVENOUS

## 2018-01-19 MED ORDER — PANTOPRAZOLE SODIUM 40 MG PO TBEC
40.0000 mg | DELAYED_RELEASE_TABLET | Freq: Every day | ORAL | Status: DC
  Administered 2018-01-19 – 2018-01-23 (×5): 40 mg via ORAL
  Filled 2018-01-19 (×5): qty 1

## 2018-01-19 MED ORDER — FERROUS SULFATE 325 (65 FE) MG PO TABS
325.0000 mg | ORAL_TABLET | Freq: Two times a day (BID) | ORAL | Status: DC
  Administered 2018-01-20 – 2018-01-23 (×6): 325 mg via ORAL
  Filled 2018-01-19 (×7): qty 1

## 2018-01-19 NOTE — Progress Notes (Signed)
CSW consulted for SNF placement. CSW spoke with pt and wife at bedside and was informed that wife had hopes of caring for pt at home however it has become a challenge as pt has appeared to have greater needs than wife can handle at this time. CSW spoke with regarding placement and at this time both parties are agreeable to placement. CSW has faxed pt out to facilities at this time and will send PT evaluation to facilities once in the system. CSW to call [employer] to began insurance authorization once PT has been placed. CSW continues to follow for further needs at this time.   Virgie Dad Dion Parrow, MSW, [facility] Clinical Social Worker [phone]

## 2018-01-19 NOTE — ED Provider Notes (Signed)
3:49 PM Assumed care from Dr. Lita Mains, please see their note for full history, physical and decision making until this point. In brief this is a 84 y.o. year old male who presented to the ED tonight with Weakness     Left rehab, wife cannot take care of him at home.  Significant weakness.  Working on.  Pending troponin and UA if positive may need admission if not will work on placement.  Likely infection.  Antibiotic started based on previous culture.  Patient not able to ambulate.  Work on getting placement however will admit to the hospital for observation overnight pending safe discharge plan  Labs, studies and imaging reviewed by myself and considered in medical decision making if ordered. Imaging interpreted by radiology.  Labs Reviewed  CBC WITH DIFFERENTIAL/PLATELET - Abnormal; Notable for the following components:      Result Value   WBC 14.2 (*)    RBC 3.53 (*)    Hemoglobin 9.5 (*)    HCT 32.1 (*)    MCHC 29.6 (*)    RDW 16.1 (*)    Neutro Abs 12.5 (*)    Lymphs Abs 0.6 (*)    All other components within normal limits  COMPREHENSIVE METABOLIC PANEL - Abnormal; Notable for the following components:   Glucose, Bld 101 (*)    BUN 30 (*)    Creatinine, Ser 1.50 (*)    Total Protein 5.5 (*)    Albumin 2.4 (*)    GFR calc non Af Amer 42 (*)    GFR calc Af Amer 48 (*)    All other components within normal limits  BRAIN NATRIURETIC PEPTIDE - Abnormal; Notable for the following components:   B Natriuretic Peptide 217.6 (*)    All other components within normal limits  TROPONIN I - Abnormal; Notable for the following components:   Troponin I 0.05 (*)    All other components within normal limits  CK - Abnormal; Notable for the following components:   Total CK 25 (*)    All other components within normal limits  URINALYSIS, ROUTINE W REFLEX MICROSCOPIC  TROPONIN I    CT Head Wo Contrast  Final Result    DG Chest 2 View  Final Result      No follow-ups on file.     Merrily Pew, MD 01/19/18 [phone]

## 2018-01-19 NOTE — Progress Notes (Signed)
CSW spoke with Tammy with [employer]. CSW was advised that she would submit for authorization for pt at this time. CSW was informed by pt's wife that top choices are [facility] and [facility]. At this time [facility] expressed that they would review pt but do not have any beds at this time. CSW to ask ED second shift CSW to follow up as needed.    Christopher Baker, MSW, [facility] Clinical Social Worker [phone]

## 2018-01-19 NOTE — Progress Notes (Signed)
Received report on pt.

## 2018-01-19 NOTE — Evaluation (Signed)
Physical Therapy Evaluation Patient Details Name: Christopher Baker MRN: [id number] DOB: 02-16-1934 Today's Date: 01/19/2018   History of Present Illness  Pt is an 84 y/o male presenting to the ED secondary to weakness, and 2 falls at home. Pt discharged from CIR on 8/1 after acute hospitalization secondary to sepsis. PMH includes a fib, OSA on CPAP, depression, HTN, prostate cancer, CHF, and CVA.   Clinical Impression  Pt admitted secondary to problem above with deficits below. Pt presenting with increased weakness, and pain in back and ribs. Pt required mod to max A to roll from side to side. Did not feel safe to attempt further mobility with +1 assist. Per wife, pt discharged from [facility] on 7/31, however, once patient was home, required +2 assist and had 2 falls. Per wife, she is unable to care for pt. Feel that pt is at increased risk for falls and will be unable to perform mobility tasks at home. Will continue to follow acutely to maximize functional mobility independence and safety.     Follow Up Recommendations SNF;Supervision/Assistance - 24 hour    Equipment Recommendations  None recommended by PT    Recommendations for Other Services       Precautions / Restrictions Precautions Precautions: Fall Precaution Comments: chest and ribs sore from CPR  Restrictions Weight Bearing Restrictions: No      Mobility  Bed Mobility Overal bed mobility: Needs Assistance Bed Mobility: Rolling Rolling: Max assist;Mod assist         General bed mobility comments: Max A to roll to the R and mod A to roll to the L. Verbal cues required for sequencing. Pt able to assist some with use of bed rails. Did not feel safe performing further mobility as pt on stretcher, and only had +1 assist. Pt also with increased pain with rolling.   Transfers                 General transfer comment: Unsafe to attempt with +1 assist.   Ambulation/Gait                Stairs             Wheelchair Mobility    Modified Rankin (Stroke Patients Only)       Balance                                             Pertinent Vitals/Pain Pain Assessment: Faces Faces Pain Scale: Hurts little more Pain Location: ribs, and back  Pain Descriptors / Indicators: Aching;Sore Pain Intervention(s): Limited activity within patient's tolerance;Monitored during session;Repositioned    Home Living Family/patient expects to be discharged to:: Skilled nursing facility                      Prior Function Level of Independence: Needs assistance   Gait / Transfers Assistance Needed: Per wife, after d/c from CIR, required +2 assist to ambulate with RW into and around home.            Hand Dominance   Dominant Hand: Right    Extremity/Trunk Assessment   Upper Extremity Assessment Upper Extremity Assessment: Generalized weakness    Lower Extremity Assessment Lower Extremity Assessment: Generalized weakness(grossly 3-/5 throughout )    Cervical / Trunk Assessment Cervical / Trunk Assessment: Other exceptions;Kyphotic Cervical / Trunk Exceptions: Back pain  Communication  Communication: No difficulties  Cognition Arousal/Alertness: Awake/alert Behavior During Therapy: WFL for tasks assessed/performed Overall Cognitive Status: History of cognitive impairments - at baseline                                 General Comments: history of dementia and memory deficits. Following simple cues      General Comments General comments (skin integrity, edema, etc.): [name] present and reports she cannot care for him at home given current deficits.     Exercises General Exercises - Lower Extremity Heel Slides: AROM;Both;5 reps(limited secondary to pain in back )   Assessment/Plan    PT Assessment Patient needs continued PT services  PT Problem List Decreased strength;Decreased activity tolerance;Decreased balance;Decreased  mobility;Decreased cognition;Decreased knowledge of use of DME;Decreased safety awareness;Decreased knowledge of precautions;Pain       PT Treatment Interventions DME instruction;Gait training;Functional mobility training;Therapeutic activities;Therapeutic exercise;Balance training;Cognitive remediation;Patient/family education    PT Goals (Current goals can be found in the Care Plan section)  Acute Rehab PT Goals Patient Stated Goal: for pt to be able to walk per wife  PT Goal Formulation: With family Time For Goal Achievement: 02/02/18 Potential to Achieve Goals: Fair    Frequency Min 2X/week   Barriers to discharge        Co-evaluation               AM-PAC PT "6 Clicks" Daily Activity  Outcome Measure Difficulty turning over in bed (including adjusting bedclothes, sheets and blankets)?: Unable Difficulty moving from lying on back to sitting on the side of the bed? : Unable Difficulty sitting down on and standing up from a chair with arms (e.g., wheelchair, bedside commode, etc,.)?: Unable Help needed moving to and from a bed to chair (including a wheelchair)?: Total Help needed walking in hospital room?: Total Help needed climbing 3-5 steps with a railing? : Total 6 Click Score: 6    End of Session Equipment Utilized During Treatment: Gait belt Activity Tolerance: Patient tolerated treatment well Patient left: in bed;with call bell/phone within reach;with family/visitor present Nurse Communication: Mobility status PT Visit Diagnosis: Unsteadiness on feet (R26.81);Muscle weakness (generalized) (M62.81);Other symptoms and signs involving the nervous system (R29.898);Difficulty in walking, not elsewhere classified (R26.2);History of falling (Z91.81);Repeated falls (R29.6) Pain - part of body: (ribs and back )    Time: 1333-1350 PT Time Calculation (min) (ACUTE ONLY): 17 min   Charges:   PT Evaluation $PT Eval Moderate Complexity: 1 Mod          Leighton Ruff, PT, DPT  Acute Rehabilitation Services  Pager: [phone]   Rudean Hitt 01/19/2018, 2:06 PM

## 2018-01-19 NOTE — ED Notes (Signed)
Patient states that he will try to use urinal instead of being I [facility] O at this time.

## 2018-01-19 NOTE — Progress Notes (Addendum)
Pt has been accepted a [facility]. CSW left voicemail with Surveyor, mining at [facility], checking to see bed availability.   Update: CSW spoke with Roselyn Reef from [facility]. [facility] possibly has a bed for pt. CSW will check back in with [facility] after pt's insurance authorization is completed.   CSW went to update pt. Pt sleeping. CSW will check back in with pt.   5:15 PM CSW met with pt's wife and pt at bedside. CSW reviewed what SNFs have accepted pt. CSW provided pt's wife with list of SNFs in the [facility] aware. CSW explained that insurance authorization has not happened as of yet. CSW reviewed SNF placement process from the ED. Pt and pt's wife agreeable to plan. CSW will continue to update pt and pt's wife of SNF acceptances.   CSW updated medical team.   Wendelyn Breslow, [facility]  [phone]

## 2018-01-19 NOTE — ED Provider Notes (Signed)
[facility] EMERGENCY DEPARTMENT Provider Note   CSN: [id number] Arrival date & time: 01/19/18  1014     History   Chief Complaint Chief Complaint  Patient presents with  . Weakness    HPI Christopher Baker is a 84 y.o. male.  HPI Patient recently admitted for urinary sepsis.  States he was in rehab facility up until yesterday.  Went home and began having increased weakness.  Fell from a chair onto the floor.  Denies striking his head.  No loss of consciousness.  Was unable to get up off the floor.  States he has ongoing low back pain which is unchanged.  Has had mild cough and states he has continuous chest pain since CPR performed earlier this year.  No new lower extremity swelling. Past Medical History:  Diagnosis Date  . Anxiety   . Arthritis    "hands, right hip; lower back" (02/28/2017)  . Arthritis with psoriasis ([place])   . Borderline diabetes    DIET CONTROLLED - PT STATES HE IS NOT DIABETIC  . Carotid stenosis, bilateral PER DR EARLY / DUPLEX  09-09-10     40 - 59%   BILATERALLY--- ASYMPTOMATIC  . Chronic lower back pain   . Depression   . Dysrhythmia    a-fib  . GERD (gastroesophageal reflux disease)    CONTROLLED W/ PROTONIX  . History of gout   . HTN (hypertension)   . Hyperlipidemia   . Iron deficiency   . Lumbar spondylosis W/ RADICULOPATHY  . OSA on CPAP   . PAF (paroxysmal atrial fibrillation) ([place])   . Presence of permanent cardiac pacemaker DDD-  06-04-10-   DDD; SECONDARY TO SYNCOPY AND BRADYCARDIA  . Prostate cancer ([place]) 2008   S/P 64 RADIATION  [place]    . Restless leg syndrome   . Stroke [facility]) Oct. 14, 2014   light left hand, balance issues, short term memory loss 2014  . SVT (supraventricular tachycardia) ([place])    S/P ABLATION   2008    Patient Active Problem List   Diagnosis Date Noted  . Generalized weakness 01/20/2018  . Palliative care by specialist   . Shortness of breath   . Acute lower UTI 01/19/2018  . History of  CVA (cerebrovascular accident)   . Chronic diastolic heart failure ([place])   . Labile blood pressure   . Postlaminectomy syndrome, lumbar region   . Anemia of chronic disease   . Dementia without behavioral disturbance   . Chronic systolic (congestive) heart failure ([place])   . Psoriatic arthritis ([place])   . Stage 3 chronic kidney disease ([place])   . Urinary frequency   . Acute blood loss anemia   . Hypoalbuminemia due to protein-calorie malnutrition ([place])   . Encephalopathy 12/30/2017  . Atrial fibrillation with rapid ventricular response ([place])   . Acute respiratory failure with hypoxemia ([place])   . Aspiration pneumonia of right upper lobe due to gastric secretions ([place])   . Drug-induced torsades de pointes 12/24/2017  . Sepsis ([place]) 12/23/2017  . Ventricular tachycardia ([place]) 12/23/2017  . Pressure injury of skin 12/23/2017  . E coli bacteremia 12/23/2017  . Urinary tract infection 12/23/2017  . Acute pulmonary edema ([place]) 12/23/2017  . Sepsis, unspecified organism ([place]) 10/30/2017  . Acute urinary retention 10/30/2017  . Chronic systolic congestive heart failure ([place]) 09/07/2017  . Goals of care, counseling/discussion 02/28/2017  . S/P shoulder replacement 06/27/2015  . Residual cognitive deficit as late effect of stroke 04/15/2015  .  Lung nodule   . Weakness generalized 12/11/2014  . Leukocytosis 12/11/2014  . Restless leg syndrome 12/11/2014  . Normocytic anemia 12/11/2014  . Abnormal EKG 12/11/2014  . Gait disturbance, post-stroke 10/22/2014  . Partial seizures ([place]) 06/18/2014  . Vitamin B12 deficiency 06/18/2014  . OSA (obstructive sleep apnea) 02/26/2014  . Cognitive deficits as late effect of cerebrovascular disease 01/11/2014  . Fatigue 09/20/2013  . Intermittent lightheadedness 06/19/2013  . Low blood pressure, not hypotension 06/19/2013  . Vascular dementia 04/09/2013  . History of completed stroke 04/03/2013  . Occlusion and stenosis of carotid artery without  mention of cerebral infarction 11/21/2012  . OA (osteoarthritis) of knee 12/20/2011  . Methotrexate, long term, current use 09/10/2011  . Knee pain, left 07/27/2011  . Pacemaker-Medtronic 09/08/2010  . UNSPECIFIED VISUAL DISTURBANCE 07/20/2010  . IRON DEFIC ANEMIA [place] DIET IRON INTAKE 04/20/2010  . PSORIASIS 04/20/2010  . DISUSE OSTEOPOROSIS 05/12/2009  . SPONDYLOSIS, LUMBAR, WITH RADICULOPATHY 10/15/2008  . OTHER SPECIFIED DERMATOMYCOSES 06/24/2008  . VARICOSE VEINS LOWER EXTREMITIES W/INFLAMMATION 12/25/2007  . INSOMNIA WITH SLEEP APNEA UNSPECIFIED 08/31/2007  . GERD 03/23/2007  . Atrial fibrillation, chronic ([place]) 01/31/2007  . PSORIATIC ARTHRITIS 01/31/2007  . Hyperlipidemia 01/19/2007  . Essential hypertension 01/19/2007  . History of prostate cancer 01/19/2007    Past Surgical History:  Procedure Laterality Date  . BACK SURGERY    . BIV UPGRADE N/A 09/07/2017   Procedure: BIV PACEMAKER UPGRADE;  Surgeon: Evans Lance, MD;  Location: [facility];  Service: Cardiovascular;  Laterality: N/A;  . BREAST SURGERY     "removed tumor; don't remember which side; years ago" (02/28/2017)  . CARDIAC ELECTROPHYSIOLOGY STUDY AND ABLATION  2008   FOR SVT  . CARDIAC PACEMAKER PLACEMENT  06-04-10   DDD  . CARDIOVERSION N/A 03/02/2017   Procedure: CARDIOVERSION;  Surgeon: Josue Hector, MD;  Location: [facility] ENDOSCOPY;  Service: Cardiovascular;  Laterality: N/A;  . CATARACT EXTRACTION W/ INTRAOCULAR LENS  IMPLANT, BILATERAL Bilateral   . CHOLECYSTECTOMY  2009  . ESOPHAGOGASTRODUODENOSCOPY N/A 12/12/2014   Procedure: ESOPHAGOGASTRODUODENOSCOPY (EGD);  Surgeon: Wilford Corner, MD;  Location: [facility] ENDOSCOPY;  Service: Endoscopy;  Laterality: N/A;  . INCISION AND DRAINAGE Right 1997   "knee replacement got infected"  . INGUINAL HERNIA REPAIR Left 06/2003    DONE WITH PENILE PROSTESIS SURG.  . INGUINAL HERNIA REPAIR Right 1962  . JOINT REPLACEMENT    . KNEE ARTHROSCOPY  06/02/2011    Procedure: ARTHROSCOPY KNEE;  Surgeon: Gearlean Alf;  Location: [facility];  Service: Orthopedics;  Laterality: Left;  LEFT KNEE ARTHROSCOPY WITH DEBRIDEMENT  . LOOP RECORDER PLACEMENT  01-30-10  . LOOP RECORDER REMOVAL  06/04/2010  . LUMBAR LAMINECTOMY/ DISKECTOMY/ FUSION  05-19-10   L4 - 5  . PENILE PROSTHESIS IMPLANT  06/2003   AND LEFT CORPOROPLASTY /    DONE INGUINAL REPAIR  . PROSTATE BIOPSY  2007  . REVISION TOTAL KNEE ARTHROPLASTY Right 1997  . TOTAL KNEE ARTHROPLASTY  12/20/2011   Procedure: TOTAL KNEE ARTHROPLASTY;  Surgeon: Gearlean Alf, MD;  Location: [facility];  Service: Orthopedics;  Laterality: Left;  . TOTAL KNEE ARTHROPLASTY Right 1997  . TOTAL SHOULDER ARTHROPLASTY Right 06/27/2015   Procedure: REVERSE TOTAL SHOULDER ARTHROPLASTY;  Surgeon: Netta Cedars, MD;  Location: [facility];  Service: Orthopedics;  Laterality: Right;  . TRANSURETHRAL RESECTION OF BLADDER TUMOR WITH GYRUS (TURBT-GYRUS)  ?2018  . TRANSURETHRAL RESECTION OF PROSTATE  2006  Home Medications    Prior to Admission medications   Medication Sig Start Date End Date Taking? Authorizing Provider  apixaban (ELIQUIS) 5 MG TABS tablet Take 1 tablet (5 mg total) by mouth 2 (two) times daily. 01/18/18  Yes Angiulli, Lavon Paganini, PA-C  acetaminophen (TYLENOL) 500 MG tablet Take 1,000 mg by mouth 2 (two) times daily.    [provider]  Artificial Tear Ointment (LUBRICANT EYE OP) Place 2 drops into both eyes daily as needed (for dry eyes).    [provider]  atorvastatin (LIPITOR) 20 MG tablet Take 1 tablet (20 mg total) by mouth daily. 01/18/18   Angiulli, Lavon Paganini, PA-C  cyanocobalamin (,VITAMIN B-12,) 1000 MCG/ML injection Inject 1,000 mcg into the muscle every 30 (thirty) days.     [provider]  donepezil (ARICEPT) 10 MG tablet Take 1 tablet (10 mg total) by mouth at bedtime. 01/18/18   Angiulli, Lavon Paganini, PA-C  DULoxetine (CYMBALTA) 30 MG capsule Take 1 capsule (30 mg  total) by mouth at bedtime. 01/18/18   Angiulli, Lavon Paganini, PA-C  ezetimibe (ZETIA) 10 MG tablet Take 1 tablet (10 mg total) by mouth daily. 03/28/15   Dutch Quint B, FNP  ferrous sulfate 325 (65 FE) MG tablet Take 1 tablet (325 mg total) by mouth 2 (two) times daily with a meal. 01/18/18   Angiulli, Lavon Paganini, PA-C  folic acid (FOLVITE) 1 MG tablet Take 1 tablet (1 mg total) by mouth at bedtime. 01/18/18   Angiulli, Lavon Paganini, PA-C  furosemide (LASIX) 40 MG tablet Take 1 tablet (40 mg total) by mouth daily. 01/18/18   Angiulli, Lavon Paganini, PA-C  Hydrocortisone (GERHARDT'S BUTT CREAM) CREA Apply 1 application topically 4 (four) times daily. 01/18/18   Angiulli, Lavon Paganini, PA-C  lidocaine (LIDODERM) 5 % Place 1 patch onto the skin daily. Remove & Discard patch within 12 hours or as directed by MD 01/19/18   Angiulli, Lavon Paganini, PA-C  methotrexate (RHEUMATREX) 15 MG tablet Take 1 tablet (15 mg total) by mouth once a week. Caution:Chemotherapy. Protect from light. 01/23/18   Angiulli, Lavon Paganini, PA-C  metoprolol succinate (TOPROL XL) 25 MG 24 hr tablet Take 1 tablet (25 mg total) by mouth at bedtime. 01/18/18   Angiulli, Lavon Paganini, PA-C  pantoprazole (PROTONIX) 40 MG tablet Take 1 tablet (40 mg total) by mouth daily. Patient taking differently: Take 40 mg by mouth at bedtime.  10/22/15   Dutch Quint B, FNP  potassium chloride SA (K-DUR,KLOR-CON) 20 MEQ tablet Take 1 tablet (20 mEq total) by mouth daily. 12/30/17   Florencia Reasons, MD  predniSONE (DELTASONE) 5 MG tablet Take 1 tablet (5 mg total) by mouth daily with breakfast. 01/18/18   Angiulli, Lavon Paganini, PA-C  tamsulosin (FLOMAX) 0.4 MG CAPS capsule Take 1 capsule (0.4 mg total) by mouth daily. 01/18/18   Angiulli, Lavon Paganini, PA-C  Vitamin D, Ergocalciferol, (DRISDOL) 50000 units CAPS capsule Take 50,000 Units by mouth every Sunday.     [provider]    Family History Family History  Problem Relation Age of Onset  . Stroke Mother   . Early death Father      Social History Social History   Tobacco Use  . Smoking status: Former Smoker    Packs/day: 1.00    Years: 45.00    Pack years: 45.00    Types: Cigarettes    Last attempt to quit: 12/07/1998    Years since quitting: 19.1  . Smokeless tobacco: Never Used  Substance Use Topics  . Alcohol use: Yes    Comment: 1 cocktail per evening  . Drug use: No     Allergies   Ciprofloxacin; Hydrocodone-acetaminophen; Other; Shellfish allergy; Tramadol; and Tikosyn [dofetilide]   Review of Systems Review of Systems  Constitutional: Positive for fatigue. Negative for chills and fever.  HENT: Negative for sore throat and trouble swallowing.   Eyes: Negative for visual disturbance.  Respiratory: Negative for cough and shortness of breath.   Cardiovascular: Positive for chest pain. Negative for palpitations and leg swelling.  Gastrointestinal: Negative for abdominal pain, constipation, diarrhea, nausea and vomiting.  Genitourinary: Negative for dysuria, flank pain, frequency and hematuria.  Musculoskeletal: Positive for back pain and myalgias. Negative for neck pain and neck stiffness.  Skin: Negative for rash and wound.  Neurological: Positive for weakness. Negative for dizziness, syncope, light-headedness, numbness and headaches.  All other systems reviewed and are negative.    Physical Exam Updated Vital Signs BP (!) 121/51 (BP Location: Right Arm)   Pulse (!) 59   Temp 97.8 F (36.6 C) (Oral)   Resp 20   Ht 6\' 3"  (1.905 m)   Wt 99.8 kg (220 lb)   SpO2 100%   BMI 27.50 kg/m   Physical Exam  Constitutional: He is oriented to person, place, and time. He appears well-developed and well-nourished. No distress.  HENT:  Head: Normocephalic and atraumatic.  Mouth/Throat: Oropharynx is clear and moist.  No obvious scalp injury.  No intraoral injury.  Mucosa is dry.  Eyes: Pupils are equal, round, and reactive to light. EOM are normal.  Neck: Normal range of motion. Neck supple.  No JVD present.  No posterior midline cervical tenderness to palpation.  Cardiovascular: Normal rate and regular rhythm.  Bradycardia  Pulmonary/Chest: Effort normal and breath sounds normal. No respiratory distress. He has no wheezes. He has no rales.  Abdominal: Soft. Bowel sounds are normal. He exhibits no distension and no mass. There is no tenderness. There is no rebound and no guarding. No hernia.  Musculoskeletal: Normal range of motion. He exhibits tenderness. He exhibits no edema.  Mild right CVA tenderness to palpation.  No midline thoracic or lumbar tenderness.  No lower extremity swelling, asymmetry or tenderness.  Lymphadenopathy:    He has no cervical adenopathy.  Neurological: He is alert and oriented to person, place, and time.  4/5 motor in all extremities.  Sensation grossly intact.  Skin: Skin is warm and dry. Capillary refill takes less than 2 seconds. No rash noted. He is not diaphoretic. No erythema.  Psychiatric: He has a normal mood and affect. His behavior is normal.  Nursing note and vitals reviewed.    ED Treatments / Results  Labs (all labs ordered are listed, but only abnormal results are displayed) Labs Reviewed  URINE CULTURE - Abnormal; Notable for the following components:      Result Value   Culture MULTIPLE SPECIES PRESENT, SUGGEST RECOLLECTION (*)    All other components within normal limits  CBC WITH DIFFERENTIAL/PLATELET - Abnormal; Notable for the following components:   WBC 14.2 (*)    RBC 3.53 (*)    Hemoglobin 9.5 (*)    HCT 32.1 (*)    MCHC 29.6 (*)    RDW 16.1 (*)    Neutro Abs 12.5 (*)    Lymphs Abs 0.6 (*)    All other components within normal limits  COMPREHENSIVE METABOLIC PANEL - Abnormal; Notable for the following components:   Glucose, Bld 101 (*)  BUN 30 (*)    Creatinine, Ser 1.50 (*)    Total Protein 5.5 (*)    Albumin 2.4 (*)    GFR calc non Af Amer 42 (*)    GFR calc Af Amer 48 (*)    All other components within  normal limits  BRAIN NATRIURETIC PEPTIDE - Abnormal; Notable for the following components:   B Natriuretic Peptide 217.6 (*)    All other components within normal limits  TROPONIN I - Abnormal; Notable for the following components:   Troponin I 0.05 (*)    All other components within normal limits  CK - Abnormal; Notable for the following components:   Total CK 25 (*)    All other components within normal limits  URINALYSIS, ROUTINE W REFLEX MICROSCOPIC - Abnormal; Notable for the following components:   APPearance HAZY (*)    Hgb urine dipstick MODERATE (*)    Protein, ur 100 (*)    Leukocytes, UA SMALL (*)    WBC, UA >50 (*)    Bacteria, UA FEW (*)    All other components within normal limits  TROPONIN I - Abnormal; Notable for the following components:   Troponin I 0.05 (*)    All other components within normal limits  TROPONIN I - Abnormal; Notable for the following components:   Troponin I 0.05 (*)    All other components within normal limits  TROPONIN I - Abnormal; Notable for the following components:   Troponin I 0.05 (*)    All other components within normal limits  TROPONIN I - Abnormal; Notable for the following components:   Troponin I 0.04 (*)    All other components within normal limits  BASIC METABOLIC PANEL - Abnormal; Notable for the following components:   Creatinine, Ser 1.29 (*)    GFR calc non Af Amer 50 (*)    GFR calc Af Amer 58 (*)    All other components within normal limits  CBC - Abnormal; Notable for the following components:   RBC 3.62 (*)    Hemoglobin 9.5 (*)    HCT 32.5 (*)    MCHC 29.2 (*)    RDW 15.9 (*)    All other components within normal limits    EKG EKG Interpretation  Date/Time:  Thursday January 19 2018 13:03:19 EDT Ventricular Rate:  81 PR Interval:    QRS Duration: 136 QT Interval:  452 QTC Calculation: 525 R Axis:   -69 Text Interpretation:  Atrial fibrillation paced ventricular beats Left axis deviation Non-specific  intra-ventricular conduction block Cannot rule out Anteroseptal infarct , age undetermined Abnormal ECG Confirmed by Thayer Jew [phone]) on 01/20/2018 3:22:50 PM   Radiology No results found.  Procedures Procedures (including critical care time)  Medications Ordered in ED Medications  ezetimibe (ZETIA) tablet 10 mg (10 mg Oral Given 01/21/18 0858)  pantoprazole (PROTONIX) EC tablet 40 mg (40 mg Oral Given 01/21/18 0901)  Vitamin D (Ergocalciferol) (DRISDOL) capsule 50,000 Units (has no administration in time range)  cyanocobalamin ((VITAMIN B-12)) injection 1,000 mcg (1,000 mcg Intramuscular Given 01/20/18 0923)  potassium chloride SA (K-DUR,KLOR-CON) CR tablet 20 mEq (20 mEq Oral Given 01/21/18 0859)  apixaban (ELIQUIS) tablet 5 mg (5 mg Oral Given 01/21/18 0900)  atorvastatin (LIPITOR) tablet 20 mg (20 mg Oral Given 01/21/18 0900)  donepezil (ARICEPT) tablet 10 mg (10 mg Oral Given 01/20/18 2206)  DULoxetine (CYMBALTA) DR capsule 30 mg (30 mg Oral Given 01/20/18 2206)  ferrous sulfate tablet 325 mg (325 mg  Oral Given 06/28/54 3149)  folic acid (FOLVITE) tablet 1 mg (1 mg Oral Given 01/20/18 2206)  Gerhardt's butt cream 1 application (1 application Topical Given 01/21/18 0901)  metoprolol succinate (TOPROL-XL) 24 hr tablet 25 mg (25 mg Oral Given 01/20/18 2206)  predniSONE (DELTASONE) tablet 5 mg (5 mg Oral Given 01/21/18 0900)  tamsulosin (FLOMAX) capsule 0.4 mg (0.4 mg Oral Given 01/21/18 0901)  acetaminophen (TYLENOL) tablet 1,000 mg (1,000 mg Oral Given 01/21/18 0858)  sodium chloride flush (NS) 0.9 % injection 3 mL (3 mLs Intravenous Given 01/21/18 0902)  sodium chloride flush (NS) 0.9 % injection 3 mL (has no administration in time range)  0.9 %  sodium chloride infusion ( Intravenous New Bag/Given 01/20/18 2208)  cefTRIAXone (ROCEPHIN) 1 g in sodium chloride 0.9 % 100 mL IVPB (1 g Intravenous New Bag/Given 01/20/18 2210)  0.9 %  sodium chloride infusion (has no administration in time range)  LORazepam  (ATIVAN) injection 1 mg (1 mg Intravenous Given 01/21/18 0029)  sodium chloride 0.9 % bolus 500 mL (0 mLs Intravenous Stopped 01/19/18 1259)  lactated ringers bolus 1,000 mL (1,000 mLs Intravenous New Bag/Given 01/19/18 2054)  ketorolac (TORADOL) 15 MG/ML injection 15 mg (15 mg Intravenous Given by Other 01/21/18 0032)     Initial Impression / Assessment and Plan / ED Course  I have reviewed the triage vital signs and the nursing notes.  Pertinent labs & imaging results that were available during my care of the patient were reviewed by me and considered in my medical decision making (see chart for details).     Concern for possible urinary tract infection.  Will likely need admission.  Signed out to oncoming emergency physician.  Final Clinical Impressions(s) / ED Diagnoses   Final diagnoses:  Generalized weakness  Lower urinary tract infectious disease    ED Discharge Orders    None       Julianne Rice, MD 01/21/18 1504

## 2018-01-19 NOTE — NC FL2 (Signed)
[place] LEVEL OF CARE SCREENING TOOL     IDENTIFICATION  Patient Name: Christopher Baker Birthdate: 05-23-1934 Sex: male Admission Date (Current Location): 01/19/2018  [facility] and [place] Number:  Herbalist and Address:  [facility] [place], [place] [address], [place], [place] [address]      Provider Number: [id number]  Attending Physician Name and Address:  Julianne Rice, MD  Relative Name and Phone Number:       Current Level of Care: Hospital Recommended Level of Care: [place] Prior Approval Number:    Date Approved/Denied:   PASRR Number:   [id number]  Discharge Plan: SNF    Current Diagnoses: Patient Active Problem List   Diagnosis Date Noted  . History of CVA (cerebrovascular accident)   . Chronic diastolic heart failure ([place])   . Labile blood pressure   . Postlaminectomy syndrome, lumbar region   . Anemia of chronic disease   . Dementia without behavioral disturbance   . Chronic systolic (congestive) heart failure ([place])   . Psoriatic arthritis ([place])   . Stage 3 chronic kidney disease ([place])   . Urinary frequency   . Acute blood loss anemia   . Hypoalbuminemia due to protein-calorie malnutrition ([place])   . Encephalopathy 12/30/2017  . Atrial fibrillation with rapid ventricular response ([place])   . Acute respiratory failure with hypoxemia ([place])   . Aspiration pneumonia of right upper lobe due to gastric secretions ([place])   . Drug-induced torsades de pointes 12/24/2017  . Sepsis ([place]) 12/23/2017  . Ventricular tachycardia ([place]) 12/23/2017  . Pressure injury of skin 12/23/2017  . E coli bacteremia 12/23/2017  . Urinary tract infection 12/23/2017  . Acute pulmonary edema ([place]) 12/23/2017  . Sepsis, unspecified organism ([place]) 10/30/2017  . Acute urinary retention 10/30/2017  . Chronic systolic congestive heart failure ([place]) 09/07/2017  . S/P shoulder replacement 06/27/2015  . Residual cognitive  deficit as late effect of stroke 04/15/2015  . Lung nodule   . Weakness generalized 12/11/2014  . Leukocytosis 12/11/2014  . Restless leg syndrome 12/11/2014  . Normocytic anemia 12/11/2014  . Abnormal EKG 12/11/2014  . Gait disturbance, post-stroke 10/22/2014  . Partial seizures ([place]) 06/18/2014  . Vitamin B12 deficiency 06/18/2014  . OSA (obstructive sleep apnea) 02/26/2014  . Cognitive deficits as late effect of cerebrovascular disease 01/11/2014  . Fatigue 09/20/2013  . Intermittent lightheadedness 06/19/2013  . Low blood pressure, not hypotension 06/19/2013  . Vascular dementia 04/09/2013  . History of completed stroke 04/03/2013  . Occlusion and stenosis of carotid artery without mention of cerebral infarction 11/21/2012  . OA (osteoarthritis) of knee 12/20/2011  . Methotrexate, long term, current use 09/10/2011  . Knee pain, left 07/27/2011  . Pacemaker-Medtronic 09/08/2010  . UNSPECIFIED VISUAL DISTURBANCE 07/20/2010  . IRON DEFIC ANEMIA [place] DIET IRON INTAKE 04/20/2010  . PSORIASIS 04/20/2010  . DISUSE OSTEOPOROSIS 05/12/2009  . SPONDYLOSIS, LUMBAR, WITH RADICULOPATHY 10/15/2008  . OTHER SPECIFIED DERMATOMYCOSES 06/24/2008  . VARICOSE VEINS LOWER EXTREMITIES W/INFLAMMATION 12/25/2007  . INSOMNIA WITH SLEEP APNEA UNSPECIFIED 08/31/2007  . GERD 03/23/2007  . Atrial fibrillation, chronic ([place]) 01/31/2007  . PSORIATIC ARTHRITIS 01/31/2007  . Hyperlipidemia 01/19/2007  . Essential hypertension 01/19/2007  . History of prostate cancer 01/19/2007    Orientation RESPIRATION BLADDER Height & Weight     Self, Situation, Place, Time  Normal Continent Weight: 220 lb (99.8 kg) Height:  6\' 3"  (190.5 cm)  BEHAVIORAL SYMPTOMS/MOOD NEUROLOGICAL BOWEL NUTRITION STATUS  Continent Diet(please see discharge summary. )  AMBULATORY STATUS COMMUNICATION OF NEEDS Skin       Normal                       Personal Care Assistance Level of Assistance  Bathing, Feeding, Dressing  Bathing Assistance: Maximum assistance Feeding assistance: Limited assistance Dressing Assistance: Maximum assistance     Functional Limitations Info    Sight Info: Adequate Hearing Info: Adequate Speech Info: Adequate    SPECIAL CARE FACTORS FREQUENCY  PT (By licensed PT), OT (By licensed OT)     PT Frequency: 5 times a week  OT Frequency: 5 times a week             Contractures Contractures Info: Not present    Additional Factors Info  Code Status, Allergies Code Status Info: Prior Allergies Info: Ciprofloxacin, Hydrocodone-acetaminophen, Other, Shellfish Allergy, Tramadol, Tikosyn Dofetilide           Current Medications (01/19/2018):  This is the current hospital active medication list No current facility-administered medications for this encounter.    Current Outpatient Medications  Medication Sig Dispense Refill  . apixaban (ELIQUIS) 5 MG TABS tablet Take 1 tablet (5 mg total) by mouth 2 (two) times daily. 60 tablet 1  . Artificial Tear Ointment (LUBRICANT EYE OP) Place 2 drops into both eyes daily as needed (for dry eyes).    Marland Kitchen atorvastatin (LIPITOR) 20 MG tablet Take 1 tablet (20 mg total) by mouth daily. 30 tablet 0  . cyanocobalamin (,VITAMIN B-12,) 1000 MCG/ML injection Inject 1,000 mcg into the muscle every 30 (thirty) days.     Marland Kitchen donepezil (ARICEPT) 10 MG tablet Take 1 tablet (10 mg total) by mouth at bedtime. 90 tablet 1  . DULoxetine (CYMBALTA) 30 MG capsule Take 1 capsule (30 mg total) by mouth at bedtime. 30 capsule 0  . ezetimibe (ZETIA) 10 MG tablet Take 1 tablet (10 mg total) by mouth daily. 90 tablet 1  . ferrous sulfate 325 (65 FE) MG tablet Take 1 tablet (325 mg total) by mouth 2 (two) times daily with a meal. 60 tablet 0  . folic acid (FOLVITE) 1 MG tablet Take 1 tablet (1 mg total) by mouth at bedtime. 30 tablet 0  . furosemide (LASIX) 40 MG tablet Take 1 tablet (40 mg total) by mouth daily. 30 tablet 0  . Hydrocortisone (GERHARDT'S BUTT CREAM)  CREA Apply 1 application topically 4 (four) times daily. 1 each 0  . lidocaine (LIDODERM) 5 % Place 1 patch onto the skin daily. Remove & Discard patch within 12 hours or as directed by MD 30 patch 0  . [START ON 01/23/2018] methotrexate (RHEUMATREX) 15 MG tablet Take 1 tablet (15 mg total) by mouth once a week. Caution:Chemotherapy. Protect from light. 4 tablet 0  . metoprolol succinate (TOPROL XL) 25 MG 24 hr tablet Take 1 tablet (25 mg total) by mouth at bedtime. 30 tablet 0  . pantoprazole (PROTONIX) 40 MG tablet Take 1 tablet (40 mg total) by mouth daily. (Patient taking differently: Take 40 mg by mouth at bedtime. ) 90 tablet 3  . potassium chloride SA (K-DUR,KLOR-CON) 20 MEQ tablet Take 1 tablet (20 mEq total) by mouth daily. 30 tablet 0  . predniSONE (DELTASONE) 5 MG tablet Take 1 tablet (5 mg total) by mouth daily with breakfast. 30 tablet 0  . tamsulosin (FLOMAX) 0.4 MG CAPS capsule Take 1 capsule (0.4 mg total) by mouth daily. 30 capsule 0  .  Vitamin D, Ergocalciferol, (DRISDOL) 50000 units CAPS capsule Take 50,000 Units by mouth every Sunday.        Discharge Medications: Please see discharge summary for a list of discharge medications.  Relevant Imaging Results:  Relevant Lab Results:   Additional Information SSN- [id number]  Wetzel Bjornstad, LCSWA

## 2018-01-19 NOTE — H&P (Addendum)
History and Physical    Christopher Baker VHQ:[id number] DOB: [birth year] DOA: 01/19/2018  PCP: Haywood Pao, MD  Patient coming from: Home  Chief Complaint: Weakness cannot walk  HPI: Christopher Baker is a 84 y.o. male with medical history significant of diabetes, hypertension, chronic back pain, proximal A. fib, history of stroke, recent hospitalization with V. tach arrest and urosepsis was just discharged from short-term rehab yesterday to home.  Wife reports that he could not walk when he was discharged yesterday from rehab center.  They are bringing him back today for continued weakness and the need for placement.  According to rehab notes it appears that patient cannot be admitted to the nursing home from the rehab center directly he had to be discharged home first in order for his long-term care insurance plan to take effect.  Case management is seen the patient today and are expecting him to get a bed offer tomorrow.  We are asked to admit the patient due to safety issues as the patient cannot get up out of bed and his wife is also frail and cannot 100% help him do so.  He denies any nausea vomiting or diarrhea.  He has not had any fevers.  Denies any unusual pain.  Denies any numbness tingling or weakness focally.  Review of Systems: As per HPI otherwise 10 point review of systems negative.   Past Medical History:  Diagnosis Date  . Anxiety   . Arthritis    "hands, right hip; lower back" (02/28/2017)  . Arthritis with psoriasis ([place])   . Borderline diabetes    DIET CONTROLLED - PT STATES HE IS NOT DIABETIC  . Carotid stenosis, bilateral PER DR EARLY / DUPLEX  09-09-10     40 - 59%   BILATERALLY--- ASYMPTOMATIC  . Chronic lower back pain   . Depression   . Dysrhythmia    a-fib  . GERD (gastroesophageal reflux disease)    CONTROLLED W/ PROTONIX  . History of gout   . HTN (hypertension)   . Hyperlipidemia   . Iron deficiency   . Lumbar spondylosis W/ RADICULOPATHY  . OSA on  CPAP   . PAF (paroxysmal atrial fibrillation) ([place])   . Presence of permanent cardiac pacemaker DDD-  06-04-10-   DDD; SECONDARY TO SYNCOPY AND BRADYCARDIA  . Prostate cancer ([place]) 2008   S/P 35 RADIATION  [place]    . Restless leg syndrome   . Stroke [facility]) Oct. 14, 2014   light left hand, balance issues, short term memory loss 2014  . SVT (supraventricular tachycardia) ([place])    S/P ABLATION   2008    Past Surgical History:  Procedure Laterality Date  . BACK SURGERY    . BIV UPGRADE N/A 09/07/2017   Procedure: BIV PACEMAKER UPGRADE;  Surgeon: Evans Lance, MD;  Location: [place] [facility];  Service: Cardiovascular;  Laterality: N/A;  . BREAST SURGERY     "removed tumor; don't remember which side; years ago" (02/28/2017)  . CARDIAC ELECTROPHYSIOLOGY STUDY AND ABLATION  2008   FOR SVT  . CARDIAC PACEMAKER PLACEMENT  06-04-10   DDD  . CARDIOVERSION N/A 03/02/2017   Procedure: CARDIOVERSION;  Surgeon: Josue Hector, MD;  Location[facility] ENDOSCOPY;  Service: Cardiovascular;  Laterality: N/A;  . CATARACT EXTRACTION W/ INTRAOCULAR LENS  IMPLANT, BILATERAL Bilateral   . CHOLECYSTECTOMY  2009  . ESOPHAGOGASTRODUODENOSCOPY N/A 12/12/2014   Procedure: ESOPHAGOGASTRODUODENOSCOPY (EGD);  Surgeon: Wilford Corner, MD;  Location: [place];  Service:  Endoscopy;  Laterality: N/A;  . INCISION AND DRAINAGE Right 1997   "knee replacement got infected"  . INGUINAL HERNIA REPAIR Left 06/2003    DONE WITH PENILE PROSTESIS SURG.  . INGUINAL HERNIA REPAIR Right 1962  . JOINT REPLACEMENT    . KNEE ARTHROSCOPY  06/02/2011   Procedure: ARTHROSCOPY KNEE;  Surgeon: Gearlean Alf;  Location: [place];  Service: Orthopedics;  Laterality: Left;  LEFT KNEE ARTHROSCOPY WITH DEBRIDEMENT  . LOOP RECORDER PLACEMENT  01-30-10  . LOOP RECORDER REMOVAL  06/04/2010  . LUMBAR LAMINECTOMY/ DISKECTOMY/ FUSION  05-19-10   L4 - 5  . PENILE PROSTHESIS IMPLANT  06/2003   AND LEFT CORPOROPLASTY /    DONE  INGUINAL REPAIR  . PROSTATE BIOPSY  2007  . REVISION TOTAL KNEE ARTHROPLASTY Right 1997  . TOTAL KNEE ARTHROPLASTY  12/20/2011   Procedure: TOTAL KNEE ARTHROPLASTY;  Surgeon: Gearlean Alf, MD;  Location: [facility];  Service: Orthopedics;  Laterality: Left;  . TOTAL KNEE ARTHROPLASTY Right 1997  . TOTAL SHOULDER ARTHROPLASTY Right 06/27/2015   Procedure: REVERSE TOTAL SHOULDER ARTHROPLASTY;  Surgeon: Netta Cedars, MD;  Location: [place];  Service: Orthopedics;  Laterality: Right;  . TRANSURETHRAL RESECTION OF BLADDER TUMOR WITH GYRUS (TURBT-GYRUS)  ?2018  . TRANSURETHRAL RESECTION OF PROSTATE  2006     reports that he quit smoking about 19 years ago. His smoking use included cigarettes. He has a 45.00 pack-year smoking history. He has never used smokeless tobacco. He reports that he drinks alcohol. He reports that he does not use drugs.  Allergies  Allergen Reactions  . Ciprofloxacin Nausea Only and Other (See Comments)    Stomach ache  . Hydrocodone-Acetaminophen Other (See Comments)    Hallucinations in higher doses  . Other Other (See Comments)    Rash from neoprene wrap after knee surgery  . Shellfish Allergy Nausea And Vomiting and Other (See Comments)    Reaction to scallops  . Tramadol Other (See Comments)    Pt becomes combative per wife  . Tikosyn [Dofetilide]     On 12/23/17 Torsades in setting of sepsis and on Tikosyn. (Dr. Caryl Comes noted: patient had a late development following presentation with septic shock of torsade de pointes.  Renal function all relatively normal on presentation had gone from 1.1--1.7 in the consequence of a shock.  This and volume resuscitation which can acutely lengthen QT intervals likely contributed to the electrical instability in the torsade de pointes).    Family History  Problem Relation Age of Onset  . Stroke Mother   . Early death Father     Prior to Admission medications   Medication Sig Start Date End Date Taking? Authorizing Provider  apixaban  (ELIQUIS) 5 MG TABS tablet Take 1 tablet (5 mg total) by mouth 2 (two) times daily. 01/18/18  Yes Angiulli, Lavon Paganini, PA-C  acetaminophen (TYLENOL) 500 MG tablet Take 1,000 mg by mouth 2 (two) times daily.    [provider]  Artificial Tear Ointment (LUBRICANT EYE OP) Place 2 drops into both eyes daily as needed (for dry eyes).    [provider]  atorvastatin (LIPITOR) 20 MG tablet Take 1 tablet (20 mg total) by mouth daily. 01/18/18   Angiulli, Lavon Paganini, PA-C  cyanocobalamin (,VITAMIN B-12,) 1000 MCG/ML injection Inject 1,000 mcg into the muscle every 30 (thirty) days.     [provider]  donepezil (ARICEPT) 10 MG tablet Take 1 tablet (10 mg total) by mouth at bedtime. 01/18/18  Angiulli, Lavon Paganini, PA-C  DULoxetine (CYMBALTA) 30 MG capsule Take 1 capsule (30 mg total) by mouth at bedtime. 01/18/18   Angiulli, Lavon Paganini, PA-C  ezetimibe (ZETIA) 10 MG tablet Take 1 tablet (10 mg total) by mouth daily. 03/28/15   Dutch Quint B, FNP  ferrous sulfate 325 (65 FE) MG tablet Take 1 tablet (325 mg total) by mouth 2 (two) times daily with a meal. 01/18/18   Angiulli, Lavon Paganini, PA-C  folic acid (FOLVITE) 1 MG tablet Take 1 tablet (1 mg total) by mouth at bedtime. 01/18/18   Angiulli, Lavon Paganini, PA-C  furosemide (LASIX) 40 MG tablet Take 1 tablet (40 mg total) by mouth daily. 01/18/18   Angiulli, Lavon Paganini, PA-C  Hydrocortisone (GERHARDT'S BUTT CREAM) CREA Apply 1 application topically 4 (four) times daily. 01/18/18   Angiulli, Lavon Paganini, PA-C  lidocaine (LIDODERM) 5 % Place 1 patch onto the skin daily. Remove & Discard patch within 12 hours or as directed by MD 01/19/18   Angiulli, Lavon Paganini, PA-C  methotrexate (RHEUMATREX) 15 MG tablet Take 1 tablet (15 mg total) by mouth once a week. Caution:Chemotherapy. Protect from light. 01/23/18   Angiulli, Lavon Paganini, PA-C  metoprolol succinate (TOPROL XL) 25 MG 24 hr tablet Take 1 tablet (25 mg total) by mouth at bedtime. 01/18/18   Angiulli, Lavon Paganini, PA-C    pantoprazole (PROTONIX) 40 MG tablet Take 1 tablet (40 mg total) by mouth daily. Patient taking differently: Take 40 mg by mouth at bedtime.  10/22/15   Dutch Quint B, FNP  potassium chloride SA (K-DUR,KLOR-CON) 20 MEQ tablet Take 1 tablet (20 mEq total) by mouth daily. 12/30/17   Florencia Reasons, MD  predniSONE (DELTASONE) 5 MG tablet Take 1 tablet (5 mg total) by mouth daily with breakfast. 01/18/18   Angiulli, Lavon Paganini, PA-C  tamsulosin (FLOMAX) 0.4 MG CAPS capsule Take 1 capsule (0.4 mg total) by mouth daily. 01/18/18   Angiulli, Lavon Paganini, PA-C  Vitamin D, Ergocalciferol, (DRISDOL) 50000 units CAPS capsule Take 50,000 Units by mouth every Sunday.     [provider]    Physical Exam: Vitals:   01/19/18 1025 01/19/18 1027 01/19/18 1751  BP: (!) 106/57  113/64  Pulse: (!) 55  67  Resp: 16  15  Temp: (!) 97.5 F (36.4 C)  97.6 F (36.4 C)  TempSrc: Oral  Oral  SpO2: 100%  100%  Weight:  99.8 kg (220 lb)   Height:  6\' 3"  (1.905 m)       Constitutional: NAD, calm, comfortable Vitals:   01/19/18 1025 01/19/18 1027 01/19/18 1751  BP: (!) 106/57  113/64  Pulse: (!) 55  67  Resp: 16  15  Temp: (!) 97.5 F (36.4 C)  97.6 F (36.4 C)  TempSrc: Oral  Oral  SpO2: 100%  100%  Weight:  99.8 kg (220 lb)   Height:  6\' 3"  (1.905 m)    Eyes: PERRL, lids and conjunctivae normal ENMT: Mucous membranes are moist. Posterior pharynx clear of any exudate or lesions.Normal dentition.  Neck: normal, supple, no masses, no thyromegaly Respiratory: clear to auscultation bilaterally, no wheezing, no crackles. Normal respiratory effort. No accessory muscle use.  Cardiovascular: Regular rate and rhythm, no murmurs / rubs / gallops. No extremity edema. 2+ pedal pulses. No carotid bruits.  Abdomen: no tenderness, no masses palpated. No hepatosplenomegaly. Bowel sounds positive.  Musculoskeletal: no clubbing / cyanosis. No joint deformity upper and lower extremities. Good ROM, no contractures. Normal  muscle tone.  Skin: no rashes, lesions, ulcers. No induration Neurologic: CN 2-12 grossly intact. Sensation intact, DTR normal. Strength 5/5 in all 4.  Psychiatric: Normal judgment and insight. Alert and oriented x 3. Normal mood.    Labs on Admission: I have personally reviewed following labs and imaging studies  CBC: Recent Labs  Lab 01/19/18 1045  WBC 14.2*  NEUTROABS 12.5*  HGB 9.5*  HCT 32.1*  MCV 90.9  PLT 315   Basic Metabolic Panel: Recent Labs  Lab 01/16/18 0518 01/19/18 1045  NA 141 143  K 3.7 4.0  CL 107 109  CO2 26 27  GLUCOSE 115* 101*  BUN 26* 30*  CREATININE 1.28* 1.50*  CALCIUM 9.5 9.5   GFR: Estimated Creatinine Clearance: 45.4 mL/min (A) (by C-G formula based on SCr of 1.5 mg/dL (H)). Liver Function Tests: Recent Labs  Lab 01/19/18 1045  AST 29  ALT 36  ALKPHOS 123  BILITOT 0.6  PROT 5.5*  ALBUMIN 2.4*   No results for input(s): LIPASE, AMYLASE in the last 168 hours. No results for input(s): AMMONIA in the last 168 hours. Coagulation Profile: No results for input(s): INR, PROTIME in the last 168 hours. Cardiac Enzymes: Recent Labs  Lab 01/19/18 1045 01/19/18 1515  CKTOTAL 25*  --   TROPONINI 0.05* 0.05*   BNP (last 3 results) No results for input(s): PROBNP in the last 8760 hours. HbA1C: No results for input(s): HGBA1C in the last 72 hours. CBG: No results for input(s): GLUCAP in the last 168 hours. Lipid Profile: No results for input(s): CHOL, HDL, LDLCALC, TRIG, CHOLHDL, LDLDIRECT in the last 72 hours. Thyroid Function Tests: No results for input(s): TSH, T4TOTAL, FREET4, T3FREE, THYROIDAB in the last 72 hours. Anemia Panel: No results for input(s): VITAMINB12, FOLATE, FERRITIN, TIBC, IRON, RETICCTPCT in the last 72 hours. Urine analysis:    Component Value Date/Time   COLORURINE YELLOW 01/19/2018 1716   APPEARANCEUR HAZY (A) 01/19/2018 1716   LABSPEC 1.018 01/19/2018 1716   LABSPEC 1.010 01/19/2006 1347   PHURINE 5.0  01/19/2018 1716   GLUCOSEU NEGATIVE 01/19/2018 1716   HGBUR MODERATE (A) 01/19/2018 1716   HGBUR 2+ 04/13/2010 0800   BILIRUBINUR NEGATIVE 01/19/2018 1716   BILIRUBINUR Negative 01/19/2006 [place] 01/19/2018 1716   PROTEINUR 100 (A) 01/19/2018 1716   UROBILINOGEN 1.0 12/11/2014 1349   NITRITE NEGATIVE 01/19/2018 1716   LEUKOCYTESUR SMALL (A) 01/19/2018 1716   LEUKOCYTESUR Small 01/19/2006 1347   Sepsis Labs: !!!!!!!!!!!!!!!!!!!!!!!!!!!!!!!!!!!!!!!!!!!! @LABRCNTIP (procalcitonin:4,lacticidven:4) )No results found for this or any previous visit (from the past 240 hour(s)).   Radiological Exams on Admission: Dg Chest 2 View  Result Date: 01/19/2018 CLINICAL DATA:  84 year old male with weakness and shortness of breath. EXAM: CHEST - 2 VIEW COMPARISON:  Portable chest 12/28/2017 and earlier. Chest CT 12/27/2017. FINDINGS: Progressed pulmonary vascularity since 12/28/2017 and resolved small pleural effusions. Stable cardiomegaly. Stable left chest 3 lead cardiac pacemaker. No pneumothorax or acute pulmonary opacity. Visualized tracheal air column is within normal limits. No acute osseous abnormality identified. Chronic postoperative changes at the right shoulder. Negative visible bowel gas pattern. IMPRESSION: Resolved interstitial edema and pleural effusions seen last month. No acute cardiopulmonary abnormality. Electronically Signed   By: Genevie Ann M.D.   On: 01/19/2018 11:25   Ct Head Wo Contrast  Result Date: 01/19/2018 CLINICAL DATA:  Generalized weakness. EXAM: CT HEAD WITHOUT CONTRAST TECHNIQUE: Contiguous axial images were obtained from the base of the skull through the vertex without intravenous contrast.  COMPARISON:  CT scan of March 30, 2016. FINDINGS: Brain: Mild diffuse cortical atrophy is noted. Old right posterior parietal infarction is noted. Mild chronic ischemic white matter disease is noted. No mass effect or midline shift is noted. Ventricular size is within  normal limits. There is no evidence of mass lesion, hemorrhage or acute infarction. Old right basal ganglia infarction is noted. Vascular: No hyperdense vessel or unexpected calcification. Skull: Normal. Negative for fracture or focal lesion. Sinuses/Orbits: No acute finding. Other: None. IMPRESSION: Mild diffuse cortical atrophy. Old right posterior parietal infarction. Mild chronic ischemic white matter disease. No acute intracranial abnormality seen. Electronically Signed   By: Marijo Conception, M.D.   On: 01/19/2018 12:05    Old chart reviewed Case discussed with Dr. Lowella Dell in the ED  Assessment/Plan 84 year old male with generalized weakness with possible UTI and the need for nursing home placement  Principal Problem:   Weakness generalized-physical therapy evaluation.  Will observe overnight for placement.  Active Problems:   Essential hypertension-continue home meds    Atrial fibrillation, chronic (HCC)-continue Eliquis currently rate controlled    Pacemaker-Medtronic-noted    Dementia without behavioral disturbance-stable and mild    Anemia of chronic disease-stable    History of CVA (cerebrovascular accident)-stable    Chronic diastolic heart failure (HCC)-stable    Acute lower UTI-placed on IV Rocephin repeat culture data.  Previous culture data reviewed and he has had previous sensitivity to Rocephin and Cipro    DVT prophylaxis: eloquis Code Status: partial Family Communication: wife and son Disposition Plan:  tomorrow Consults called: none Admission status: observation   Jisselle Poth A MD [facility]  If 7PM-7AM, please contact night-coverage [email or web address] Password [facility]  01/19/2018, 7:10 PM  Also note patient still has elevation of his troponin secondary to NSTEMI from his last hospitalization.  This is appropriately still trending down.  And flat at this time.  Patient having no chest pain.  No further work-up.

## 2018-01-19 NOTE — ED Triage Notes (Signed)
Patient from home after recently being d/c from ICU for sepsis. States he is feeling weak today and could not get up or walk this am.

## 2018-01-19 NOTE — Patient Outreach (Signed)
[place] [facility]) Care Management  01/19/2018  Christopher Baker July 08, 1933 [id number]  [facility] CSW was notified that patient arrived back to ED after a fall in his home. [facility] CSW spoke with both [place] and inpatient CSW Lennart Pall about case. Patient's plan is now to discharge to a SNF instead back to home. [facility] CSW will await for hospital discharge and SNF admission.  Eula Fried, BSW, MSW, [email or web address] Phone: [phone] Fax: [phone]

## 2018-01-19 NOTE — Consult Note (Signed)
   [facility] CM Inpatient Consult   01/19/2018  Christopher Baker 09/02/1933 [id number]    Made aware of patient's ED admit by inpatient rehab LCSW. [name] signed up with [facility] prior to discharge from previous inpatient rehab stay.   Spoke with ED RNCM earlier today to discuss patient's situation.   Updated [place] LCSW as well.   Chart reviewed and noted discharge plan is for [facility] SNF pending insurance authorization.  Appreciative of collaboration efforts.   Marthenia Rolling, MSN-Ed, RN,BSN [facility] Liaison [phone]

## 2018-01-19 NOTE — Discharge Planning (Signed)
EDCM spoke with Lorre Nick (Inpatient Rehab) and Marthenia Rolling regarding pt ED admission.  Pt spouse is requesting pt be placed in SNF (pt recently discharged from [facility]) as she can not manage his care in the home.  EDCM placed PT order for evlulation of mobility as it required for possible SNF placement.

## 2018-01-20 ENCOUNTER — Ambulatory Visit: Payer: Self-pay

## 2018-01-20 ENCOUNTER — Encounter (HOSPITAL_COMMUNITY): Payer: Self-pay

## 2018-01-20 DIAGNOSIS — D638 Anemia in other chronic diseases classified elsewhere: Secondary | ICD-10-CM

## 2018-01-20 DIAGNOSIS — N390 Urinary tract infection, site not specified: Secondary | ICD-10-CM | POA: Diagnosis present

## 2018-01-20 DIAGNOSIS — N183 Chronic kidney disease, stage 3 (moderate): Secondary | ICD-10-CM | POA: Diagnosis present

## 2018-01-20 DIAGNOSIS — F0390 Unspecified dementia without behavioral disturbance: Secondary | ICD-10-CM | POA: Diagnosis present

## 2018-01-20 DIAGNOSIS — Z881 Allergy status to other antibiotic agents status: Secondary | ICD-10-CM | POA: Diagnosis not present

## 2018-01-20 DIAGNOSIS — E1122 Type 2 diabetes mellitus with diabetic chronic kidney disease: Secondary | ICD-10-CM | POA: Diagnosis present

## 2018-01-20 DIAGNOSIS — E785 Hyperlipidemia, unspecified: Secondary | ICD-10-CM | POA: Diagnosis present

## 2018-01-20 DIAGNOSIS — I130 Hypertensive heart and chronic kidney disease with heart failure and stage 1 through stage 4 chronic kidney disease, or unspecified chronic kidney disease: Secondary | ICD-10-CM | POA: Diagnosis present

## 2018-01-20 DIAGNOSIS — Z8673 Personal history of transient ischemic attack (TIA), and cerebral infarction without residual deficits: Secondary | ICD-10-CM | POA: Diagnosis not present

## 2018-01-20 DIAGNOSIS — Z515 Encounter for palliative care: Secondary | ICD-10-CM

## 2018-01-20 DIAGNOSIS — W07XXXA Fall from chair, initial encounter: Secondary | ICD-10-CM | POA: Diagnosis present

## 2018-01-20 DIAGNOSIS — I471 Supraventricular tachycardia: Secondary | ICD-10-CM | POA: Diagnosis present

## 2018-01-20 DIAGNOSIS — Z91013 Allergy to seafood: Secondary | ICD-10-CM | POA: Diagnosis not present

## 2018-01-20 DIAGNOSIS — Z885 Allergy status to narcotic agent status: Secondary | ICD-10-CM | POA: Diagnosis not present

## 2018-01-20 DIAGNOSIS — G4733 Obstructive sleep apnea (adult) (pediatric): Secondary | ICD-10-CM | POA: Diagnosis present

## 2018-01-20 DIAGNOSIS — I5032 Chronic diastolic (congestive) heart failure: Secondary | ICD-10-CM

## 2018-01-20 DIAGNOSIS — R531 Weakness: Secondary | ICD-10-CM | POA: Diagnosis present

## 2018-01-20 DIAGNOSIS — Z888 Allergy status to other drugs, medicaments and biological substances status: Secondary | ICD-10-CM | POA: Diagnosis not present

## 2018-01-20 DIAGNOSIS — I482 Chronic atrial fibrillation: Secondary | ICD-10-CM | POA: Diagnosis not present

## 2018-01-20 DIAGNOSIS — M545 Low back pain: Secondary | ICD-10-CM | POA: Diagnosis present

## 2018-01-20 DIAGNOSIS — I480 Paroxysmal atrial fibrillation: Secondary | ICD-10-CM | POA: Diagnosis present

## 2018-01-20 DIAGNOSIS — R0602 Shortness of breath: Secondary | ICD-10-CM

## 2018-01-20 DIAGNOSIS — M109 Gout, unspecified: Secondary | ICD-10-CM | POA: Diagnosis present

## 2018-01-20 DIAGNOSIS — G8929 Other chronic pain: Secondary | ICD-10-CM | POA: Diagnosis present

## 2018-01-20 DIAGNOSIS — Z7189 Other specified counseling: Secondary | ICD-10-CM

## 2018-01-20 DIAGNOSIS — I5042 Chronic combined systolic (congestive) and diastolic (congestive) heart failure: Secondary | ICD-10-CM | POA: Diagnosis present

## 2018-01-20 DIAGNOSIS — K219 Gastro-esophageal reflux disease without esophagitis: Secondary | ICD-10-CM | POA: Diagnosis present

## 2018-01-20 DIAGNOSIS — F329 Major depressive disorder, single episode, unspecified: Secondary | ICD-10-CM | POA: Diagnosis present

## 2018-01-20 DIAGNOSIS — G2581 Restless legs syndrome: Secondary | ICD-10-CM | POA: Diagnosis present

## 2018-01-20 DIAGNOSIS — E611 Iron deficiency: Secondary | ICD-10-CM | POA: Diagnosis present

## 2018-01-20 DIAGNOSIS — I10 Essential (primary) hypertension: Secondary | ICD-10-CM

## 2018-01-20 LAB — URINE CULTURE

## 2018-01-20 LAB — BASIC METABOLIC PANEL
ANION GAP: 13 (ref 5–15)
BUN: 23 mg/dL (ref 8–23)
CALCIUM: 9.4 mg/dL (ref 8.9–10.3)
CO2: 22 mmol/L (ref 22–32)
Chloride: 107 mmol/L (ref 98–111)
Creatinine, Ser: 1.29 mg/dL — ABNORMAL HIGH (ref 0.61–1.24)
GFR calc Af Amer: 58 mL/min — ABNORMAL LOW (ref >60)
GFR calc non Af Amer: 50 mL/min — ABNORMAL LOW (ref >60)
GLUCOSE: 97 mg/dL (ref 70–99)
Potassium: 3.8 mmol/L (ref 3.5–5.1)
Sodium: 142 mmol/L (ref 135–145)

## 2018-01-20 LAB — CBC
HCT: 32.5 % — ABNORMAL LOW (ref 39.0–52.0)
HEMOGLOBIN: 9.5 g/dL — AB (ref 13.0–17.0)
MCH: 26.2 pg (ref 26.0–34.0)
MCHC: 29.2 g/dL — AB (ref 30.0–36.0)
MCV: 89.8 fL (ref 78.0–100.0)
Platelets: 321 10*3/uL (ref 150–400)
RBC: 3.62 MIL/uL — ABNORMAL LOW (ref 4.22–5.81)
RDW: 15.9 % — AB (ref 11.5–15.5)
WBC: 9.8 10*3/uL (ref 4.0–10.5)

## 2018-01-20 LAB — TROPONIN I
TROPONIN I: 0.05 ng/mL — AB (ref <0.03)
TROPONIN I: 0.04 ng/mL — AB (ref <0.03)

## 2018-01-20 MED ORDER — SODIUM CHLORIDE 0.9 % IV SOLN
INTRAVENOUS | Status: DC | PRN
Start: 2018-01-20 — End: 2018-01-22

## 2018-01-20 NOTE — Clinical Social Work Note (Signed)
Clinical Social Work Assessment  Patient Details  Name: Christopher Baker MRN: [id number] Date of Birth: Apr 12, 1934  Date of referral:  01/20/18               Reason for consult:  Facility Placement, Discharge Planning                Permission sought to share information with:  Family Supports Permission granted to share information::  Yes, Verbal Permission Granted  Name::     Gillie Crisci   Agency::  family  Relationship::   spouse   Contact Information:  Arn Mcomber [phone]  Housing/Transportation Living arrangements for the past 2 months:  Single Family Home(with wife. ) Source of Information:  Spouse Patient Interpreter Needed:  None Criminal Activity/Legal Involvement Pertinent to Current Situation/Hospitalization:  No - Comment as needed Significant Relationships:  Spouse Lives with:  Spouse Do you feel safe going back to the place where you live?  Yes(after more rehab.) Need for family participation in patient care:  Yes (Comment)  Care giving concerns:  CSW informed that pt recently discharged from inpt rehab to home Wife expressed inability to care for pt at this time as needed.    Social Worker assessment / plan:  CSW spoke with pt's wife at bedside. CSW was informed that pt is from home with wife. Wife verbalized that pt was just discharged from inpt rehab back home and since being home wife has had difficulties caring for pt. Both wife and pt are agreeable to rehab with top choices being [facility] and [facility].   Employment status:  Retired Advertising copywriter PT Recommendations:  [place] / Referral to community resources:  Skilled Nursing Facility(spoke with wife about placement options. )  Patient/Family's Response to care:  Response to care was understanding and agreeable to plan of care at this time.   Patient/Family's Understanding of and Emotional Response to Diagnosis, Current Treatment, and Prognosis:   No further questions have been warranted to this CSW.   Emotional Assessment Appearance:  Appears stated age Attitude/Demeanor/Rapport:  Gracious, Engaged Affect (typically observed):  Accepting, Hopeful Orientation:  Oriented to Self, Oriented to Place, Oriented to  Time, Oriented to Situation Alcohol / Substance use:  Not Applicable Psych involvement (Current and /or in the community):  No (Comment)  Discharge Needs  Concerns to be addressed:  Denies Needs/Concerns at this time Readmission within the last 30 days:  Yes Current discharge risk:  Dependent with Mobility Barriers to Discharge:  Continued Medical Work up   [employer], [place] 01/20/2018, 7:30 AM

## 2018-01-20 NOTE — Progress Notes (Signed)
PROGRESS NOTE    Christopher Baker  YOV:[id number] DOB: 1934-05-07 DOA: 01/19/2018 PCP: Haywood Pao, MD    Brief Narrative:  Christopher Baker is a 84 y.o. male with medical history significant of diabetes, hypertension, chronic back pain, proximal A. fib, history of stroke, recent hospitalization with V. tach arrest and urosepsis was just discharged from short-term rehab yesterday to home.  Wife reports that he could not walk when he was discharged yesterday from rehab center.  They are bringing him back today for continued weakness and the need for placement.     Assessment & Plan:   Principal Problem:   Weakness generalized Active Problems:   Essential hypertension   Atrial fibrillation, chronic (HCC)   Pacemaker-[employer]   Dementia without behavioral disturbance   Anemia of chronic disease   History of CVA (cerebrovascular accident)   Chronic diastolic heart failure (HCC)   Acute lower UTI   Generalized weakness:  Suspect from deconditioning. As per notes, the wife was not able to take care of him at home for ADL'S. PT/OT Ordered.  Since he has poor po intake, clinically deterioration , recommending palliative care consult for goals of care.  Nutrition consulted.    Hypertension:  Well controlled.    H/o CVA: No new complaints.  Resume eliquis.    Hyperlipidemia:  Resume lipitor.    Chronic diastolic heart failure.  He appears dehydrated and dry. Hold the lasix.    Dementia:  Resume aricept.   Stage 3 CKD: Creatinine appears to be at baseline.    Anemia of chronic disease:  Hemoglobin stable around 9.   Urinary tract infection:  On IV rocephin and IV fluids.  Urine cultures show multiple species present.    Chronic atrial fibrillation Rate controlled.      DVT prophylaxis: eliquis. Code Status: PARTIAL. Family Communication: none at bedside.  Disposition Plan: pending clinical improvement.    Consultants:   Physical therapy.    Palliative care.   Nutrition.    Procedures: none    Antimicrobials: rocephin. Since admission.     Subjective: No chest pain or sob.  No nausea or vomiting.   Objective: Vitals:   01/19/18 1751 01/19/18 1958 01/19/18 2000 01/20/18 0532  BP: 113/64 (!) 122/51  (!) 123/52  Pulse: 67 86  (!) 59  Resp: 15 16  18   Temp: 97.6 F (36.4 C) 97.7 F (36.5 C)  (!) 97.4 F (36.3 C)  TempSrc: Oral Oral  Oral  SpO2: 100% 92%  99%  Weight:      Height:   6\' 3"  (1.905 m)     Intake/Output Summary (Last 24 hours) at 01/20/2018 1149 Last data filed at 01/20/2018 1000 Gross per 24 hour  Intake 150 ml  Output -  Net 150 ml   Filed Weights   01/19/18 1027  Weight: 99.8 kg (220 lb)    Examination:  General exam: Appears calm and comfortable , not in distress.  Respiratory system: Clear to auscultation. Respiratory effort normal. Cardiovascular system: S1 & S2 heard, irregular, No pedal edema. Gastrointestinal system: Abdomen is soft NT ND BS+ Central nervous system: Alert and oriented to person  only.  Extremities: Symmetric 5 x 5 power. Skin: No rashes, lesions or ulcers Psychiatry: Mood & affect appropriate.     Data Reviewed: I have personally reviewed following labs and imaging studies  CBC: Recent Labs  Lab 01/19/18 1045 01/20/18 0813  WBC 14.2* 9.8  NEUTROABS 12.5*  --   HGB  9.5* 9.5*  HCT 32.1* 32.5*  MCV 90.9 89.8  PLT 303 038   Basic Metabolic Panel: Recent Labs  Lab 01/16/18 0518 01/19/18 1045 01/20/18 0813  NA 141 143 142  K 3.7 4.0 3.8  CL 107 109 107  CO2 26 27 22   GLUCOSE 115* 101* 97  BUN 26* 30* 23  CREATININE 1.28* 1.50* 1.29*  CALCIUM 9.5 9.5 9.4   GFR: Estimated Creatinine Clearance: 52.8 mL/min (A) (by C-G formula based on SCr of 1.29 mg/dL (H)). Liver Function Tests: Recent Labs  Lab 01/19/18 1045  AST 29  ALT 36  ALKPHOS 123  BILITOT 0.6  PROT 5.5*  ALBUMIN 2.4*   No results for input(s): LIPASE, AMYLASE in the last  168 hours. No results for input(s): AMMONIA in the last 168 hours. Coagulation Profile: No results for input(s): INR, PROTIME in the last 168 hours. Cardiac Enzymes: Recent Labs  Lab 01/19/18 1045 01/19/18 1515 01/19/18 2029 01/20/18 0243 01/20/18 0813  CKTOTAL 25*  --   --   --   --   TROPONINI 0.05* 0.05* 0.05* 0.05* 0.04*   BNP (last 3 results) No results for input(s): PROBNP in the last 8760 hours. HbA1C: No results for input(s): HGBA1C in the last 72 hours. CBG: No results for input(s): GLUCAP in the last 168 hours. Lipid Profile: No results for input(s): CHOL, HDL, LDLCALC, TRIG, CHOLHDL, LDLDIRECT in the last 72 hours. Thyroid Function Tests: No results for input(s): TSH, T4TOTAL, FREET4, T3FREE, THYROIDAB in the last 72 hours. Anemia Panel: No results for input(s): VITAMINB12, FOLATE, FERRITIN, TIBC, IRON, RETICCTPCT in the last 72 hours. Sepsis Labs: No results for input(s): PROCALCITON, LATICACIDVEN in the last 168 hours.  No results found for this or any previous visit (from the past 240 hour(s)).       Radiology Studies: Dg Chest 2 View  Result Date: 01/19/2018 CLINICAL DATA:  84 year old male with weakness and shortness of breath. EXAM: CHEST - 2 VIEW COMPARISON:  Portable chest 12/28/2017 and earlier. Chest CT 12/27/2017. FINDINGS: Progressed pulmonary vascularity since 12/28/2017 and resolved small pleural effusions. Stable cardiomegaly. Stable left chest 3 lead cardiac pacemaker. No pneumothorax or acute pulmonary opacity. Visualized tracheal air column is within normal limits. No acute osseous abnormality identified. Chronic postoperative changes at the right shoulder. Negative visible bowel gas pattern. IMPRESSION: Resolved interstitial edema and pleural effusions seen last month. No acute cardiopulmonary abnormality. Electronically Signed   By: Genevie Ann M.D.   On: 01/19/2018 11:25   Ct Head Wo Contrast  Result Date: 01/19/2018 CLINICAL DATA:  Generalized  weakness. EXAM: CT HEAD WITHOUT CONTRAST TECHNIQUE: Contiguous axial images were obtained from the base of the skull through the vertex without intravenous contrast. COMPARISON:  CT scan of March 30, 2016. FINDINGS: Brain: Mild diffuse cortical atrophy is noted. Old right posterior parietal infarction is noted. Mild chronic ischemic white matter disease is noted. No mass effect or midline shift is noted. Ventricular size is within normal limits. There is no evidence of mass lesion, hemorrhage or acute infarction. Old right basal ganglia infarction is noted. Vascular: No hyperdense vessel or unexpected calcification. Skull: Normal. Negative for fracture or focal lesion. Sinuses/Orbits: No acute finding. Other: None. IMPRESSION: Mild diffuse cortical atrophy. Old right posterior parietal infarction. Mild chronic ischemic white matter disease. No acute intracranial abnormality seen. Electronically Signed   By: Marijo Conception, M.D.   On: 01/19/2018 12:05        Scheduled Meds: . acetaminophen  1,000  mg Oral BID  . apixaban  5 mg Oral BID  . atorvastatin  20 mg Oral Daily  . cyanocobalamin  1,000 mcg Intramuscular Q30 days  . donepezil  10 mg Oral QHS  . DULoxetine  30 mg Oral QHS  . ezetimibe  10 mg Oral Daily  . ferrous sulfate  325 mg Oral BID WC  . folic acid  1 mg Oral QHS  . furosemide  40 mg Oral Daily  . Gerhardt's butt cream  1 application Topical QID  . metoprolol succinate  25 mg Oral QHS  . pantoprazole  40 mg Oral Daily  . potassium chloride SA  20 mEq Oral Daily  . predniSONE  5 mg Oral Q breakfast  . sodium chloride flush  3 mL Intravenous Q12H  . tamsulosin  0.4 mg Oral Daily  . [START ON 01/22/2018] Vitamin D (Ergocalciferol)  50,000 Units Oral Q Sun   Continuous Infusions: . sodium chloride    . cefTRIAXone (ROCEPHIN)  IV 1 g (01/19/18 2346)     LOS: 0 days    Time spent: 35 minutes.     Hosie Poisson, MD [facility] Pager [phone]  If 7PM-7AM, please  contact night-coverage [email or web address] Password [facility] 01/20/2018, 11:49 AM

## 2018-01-20 NOTE — Consult Note (Signed)
   [facility] CM Inpatient Consult   01/20/2018  Christopher Baker 10-Aug-1933 [id number]   [facility] Care Management follow up.  Chart reviewed. Noted SNF was denied. Text page sent to MD to request consideration for goals of care/palliative consult. [name] has had multiple hospitalizations with multiple co-morbities.  Will continue to follow.   Marthenia Rolling, MSN-Ed, RN,BSN [facility] Liaison [phone]

## 2018-01-20 NOTE — Progress Notes (Signed)
Per palliative care, patient's spouse prefers to take the patient home with hospice care. Patient will need a hospital bed and oxygen delivered to the home before discharge. Patient's long term care insurance will cover aides to come and stay with the patient as well. Please confirm timing of discharge with availability of aides set up in the home. More details can be found in Palliative's note. RNCM aware of choice for [facility].   CSW signing off.   Percell Locus [name] LCSW [phone]

## 2018-01-20 NOTE — Progress Notes (Signed)
CSW received notification that patient has been denied by [facility] for SNF due to patient being custodial and not making gains while at CIR. [facility] is offering a peer to peer with Hospital MD. If MD in agreement with completing peer to peer, contact is Dr. Amalia Hailey [phone]. If not, then patient will need to return home with home health versus privately paying to go to SNF.   Percell Locus Eiliana Drone LCSW [phone]

## 2018-01-20 NOTE — Consult Note (Signed)
Consultation Note Date: 01/20/2018   Patient Name: Christopher Baker  DOB: 17-Oct-1933  MRN: [id number]  Age / Sex: 84 y.o., male  PCP: Tisovec, Fransico Him, MD Referring Physician: Hosie Poisson, MD  Reason for Consultation: Establishing goals of care, Hospice Evaluation and Psychosocial/spiritual support  HPI/Patient Profile: 84 y.o. male  with past medical history of diabetes type 2, atrial fib, systolic heart failure with an ejection fraction of 25%, pacemaker, dementia, obstructive sleep apnea using CPAP, history of V. tach arrest, urosepsis, recurrent urinary tract infections, chronic back pain, hypertension, admitted on 01/19/2018 with increased weakness.  Patient was just discharged from the hospital rehab 1 day prior.  His family brought him in because he was too weak to stand or walk.  Unfortunately, patient has had 5 admissions in 2019 secondary to worsening heart failure and is noted a V. tach arrest.  Consult ordered for goals of care.   Clinical Assessment and Goals of Care: Met with patient, chart reviewed.  Patient's wife , [name], is at the bedside.  Patient and wife share that they wish to spend their time at home if at all possible and not pursue further rehab.  Patient fortunately has a very generous long-term health care plan through [facility] which we explored as a means to get patient home with additional support as well as utilizing hospice.  Patient is still able to participate in goals of care conversations and can answer brief simple/yes and no questions but he does have short-term memory deficits.  His wife [name] would be his healthcare proxy in the event he were unable to speak for himself  MOST form filled out at the bedside.  Patient is a DNR outside of the hospital    SUMMARY OF RECOMMENDATIONS   DNR outside of the hospital MOST form completed.  Spouse has the original Home with  hospice.  Consult care management placed to help facilitate referral.  Offered choice per Medicare guidelines; family has elected [facility] [place] Patient's spouse has been in contact with [facility] to arrange additional caregivers to coincide with discharge home. Patient will need a hospital bed and oxygen delivered to the home through hospice prior to discharge Code Status/Advance Care Planning:  DNR    Symptom Management:   Urinary retention: Recommend DC home with a Foley catheter in place.  Continue Flomax until decided whether to maintain Foley or DC  Dyspnea: Patient is opioid nave.  His creatinine is 1.29.  He should be a good candidate for morphine oral concentrate should relief of pain or shortness of breath become more of an issue in the home setting.  Palliative Prophylaxis:   Aspiration, Bowel Regimen, Delirium Protocol, Eye Care, Frequent Pain Assessment, Oral Care and Turn Reposition  Additional Recommendations (Limitations, Scope, Preferences):  Avoid Hospitalization, Minimize Medications, Initiate Comfort Feeding, No Artificial Feeding, No Blood Transfusions, No Chemotherapy, No Hemodialysis, No Radiation, No Surgical Procedures and No Tracheostomy  Psycho-social/Spiritual:   Desire for further Chaplaincy support:no  Additional Recommendations: Referral to [employer]  Prognosis:   < 6 months in the setting of advanced systolic heart failure with an ejection fraction of 25%, atrial fib, history of V. tach arrest, frequent urinary tract infections leading to urosepsis.  Patient is extremely weak.  He is only eating bites and sips and is unable to walk at this point  Discharge Planning: Home with Hospice      Primary Diagnoses: Present on Admission: . Anemia of chronic disease . Atrial fibrillation, chronic ([place]) . Chronic diastolic heart failure ([place]) . Dementia without behavioral disturbance . Essential hypertension . Pacemaker-Medtronic .  Acute lower UTI   I have reviewed the medical record, interviewed the patient and family, and examined the patient. The following aspects are pertinent.  Past Medical History:  Diagnosis Date  . Anxiety   . Arthritis    "hands, right hip; lower back" (02/28/2017)  . Arthritis with psoriasis ([place])   . Borderline diabetes    DIET CONTROLLED - PT STATES HE IS NOT DIABETIC  . Carotid stenosis, bilateral PER DR EARLY / DUPLEX  09-09-10     40 - 59%   BILATERALLY--- ASYMPTOMATIC  . Chronic lower back pain   . Depression   . Dysrhythmia    a-fib  . GERD (gastroesophageal reflux disease)    CONTROLLED W/ PROTONIX  . History of gout   . HTN (hypertension)   . Hyperlipidemia   . Iron deficiency   . Lumbar spondylosis W/ RADICULOPATHY  . OSA on CPAP   . PAF (paroxysmal atrial fibrillation) ([place])   . Presence of permanent cardiac pacemaker DDD-  06-04-10-   DDD; SECONDARY TO SYNCOPY AND BRADYCARDIA  . Prostate cancer ([place]) 2008   S/P 36 RADIATION  [place]    . Restless leg syndrome   . Stroke [facility]) Oct. 14, 2014   light left hand, balance issues, short term memory loss 2014  . SVT (supraventricular tachycardia) ([place])    S/P ABLATION   2008   Social History   Socioeconomic History  . Marital status: Married    Spouse name: Not on file  . Number of children: Not on file  . Years of education: Not on file  . Highest education level: Not on file  Occupational History  . Occupation: retired  Scientific laboratory technician  . Financial resource strain: Not on file  . Food insecurity:    Worry: Not on file    Inability: Not on file  . Transportation needs:    Medical: Not on file    Non-medical: Not on file  Tobacco Use  . Smoking status: Former Smoker    Packs/day: 1.00    Years: 45.00    Pack years: 45.00    Types: Cigarettes    Last attempt to quit: 12/07/1998    Years since quitting: 19.1  . Smokeless tobacco: Never Used  Substance and Sexual Activity  . Alcohol use: Yes    Comment: 1  cocktail per evening  . Drug use: No  . Sexual activity: Not Currently    Partners: Female  Lifestyle  . Physical activity:    Days per week: Not on file    Minutes per session: Not on file  . Stress: Not on file  Relationships  . Social connections:    Talks on phone: Not on file    Gets together: Not on file    Attends religious service: Not on file    Active member of club or organization: Not on file    Attends meetings of  clubs or organizations: Not on file    Relationship status: Not on file  Other Topics Concern  . Not on file  Social History Narrative  . Not on file   Family History  Problem Relation Age of Onset  . Stroke Mother   . Early death Father    Scheduled Meds: . acetaminophen  1,000 mg Oral BID  . apixaban  5 mg Oral BID  . atorvastatin  20 mg Oral Daily  . cyanocobalamin  1,000 mcg Intramuscular Q30 days  . donepezil  10 mg Oral QHS  . DULoxetine  30 mg Oral QHS  . ezetimibe  10 mg Oral Daily  . ferrous sulfate  325 mg Oral BID WC  . folic acid  1 mg Oral QHS  . Gerhardt's butt cream  1 application Topical QID  . metoprolol succinate  25 mg Oral QHS  . pantoprazole  40 mg Oral Daily  . potassium chloride SA  20 mEq Oral Daily  . predniSONE  5 mg Oral Q breakfast  . sodium chloride flush  3 mL Intravenous Q12H  . tamsulosin  0.4 mg Oral Daily  . [START ON 01/22/2018] Vitamin D (Ergocalciferol)  50,000 Units Oral Q Sun   Continuous Infusions: . sodium chloride    . sodium chloride    . cefTRIAXone (ROCEPHIN)  IV 1 g (01/19/18 2346)   PRN Meds:.sodium chloride, sodium chloride, sodium chloride flush Medications Prior to Admission:  Prior to Admission medications   Medication Sig Start Date End Date Taking? Authorizing Provider  apixaban (ELIQUIS) 5 MG TABS tablet Take 1 tablet (5 mg total) by mouth 2 (two) times daily. 01/18/18  Yes Angiulli, Lavon Paganini, PA-C  acetaminophen (TYLENOL) 500 MG tablet Take 1,000 mg by mouth 2 (two) times daily.     [provider]  Artificial Tear Ointment (LUBRICANT EYE OP) Place 2 drops into both eyes daily as needed (for dry eyes).    [provider]  atorvastatin (LIPITOR) 20 MG tablet Take 1 tablet (20 mg total) by mouth daily. 01/18/18   Angiulli, Lavon Paganini, PA-C  cyanocobalamin (,VITAMIN B-12,) 1000 MCG/ML injection Inject 1,000 mcg into the muscle every 30 (thirty) days.     [provider]  donepezil (ARICEPT) 10 MG tablet Take 1 tablet (10 mg total) by mouth at bedtime. 01/18/18   Angiulli, Lavon Paganini, PA-C  DULoxetine (CYMBALTA) 30 MG capsule Take 1 capsule (30 mg total) by mouth at bedtime. 01/18/18   Angiulli, Lavon Paganini, PA-C  ezetimibe (ZETIA) 10 MG tablet Take 1 tablet (10 mg total) by mouth daily. 03/28/15   Dutch Quint B, FNP  ferrous sulfate 325 (65 FE) MG tablet Take 1 tablet (325 mg total) by mouth 2 (two) times daily with a meal. 01/18/18   Angiulli, Lavon Paganini, PA-C  folic acid (FOLVITE) 1 MG tablet Take 1 tablet (1 mg total) by mouth at bedtime. 01/18/18   Angiulli, Lavon Paganini, PA-C  furosemide (LASIX) 40 MG tablet Take 1 tablet (40 mg total) by mouth daily. 01/18/18   Angiulli, Lavon Paganini, PA-C  Hydrocortisone (GERHARDT'S BUTT CREAM) CREA Apply 1 application topically 4 (four) times daily. 01/18/18   Angiulli, Lavon Paganini, PA-C  lidocaine (LIDODERM) 5 % Place 1 patch onto the skin daily. Remove & Discard patch within 12 hours or as directed by MD 01/19/18   Angiulli, Lavon Paganini, PA-C  methotrexate (RHEUMATREX) 15 MG tablet Take 1 tablet (15 mg total) by mouth once a week. Caution:Chemotherapy. Protect from  light. 01/23/18   Angiulli, Lavon Paganini, PA-C  metoprolol succinate (TOPROL XL) 25 MG 24 hr tablet Take 1 tablet (25 mg total) by mouth at bedtime. 01/18/18   Angiulli, Lavon Paganini, PA-C  pantoprazole (PROTONIX) 40 MG tablet Take 1 tablet (40 mg total) by mouth daily. Patient taking differently: Take 40 mg by mouth at bedtime.  10/22/15   Dutch Quint B, FNP  potassium chloride SA  (K-DUR,KLOR-CON) 20 MEQ tablet Take 1 tablet (20 mEq total) by mouth daily. 12/30/17   Florencia Reasons, MD  predniSONE (DELTASONE) 5 MG tablet Take 1 tablet (5 mg total) by mouth daily with breakfast. 01/18/18   Angiulli, Lavon Paganini, PA-C  tamsulosin (FLOMAX) 0.4 MG CAPS capsule Take 1 capsule (0.4 mg total) by mouth daily. 01/18/18   Angiulli, Lavon Paganini, PA-C  Vitamin D, Ergocalciferol, (DRISDOL) 50000 units CAPS capsule Take 50,000 Units by mouth every Sunday.     [provider]   Allergies  Allergen Reactions  . Ciprofloxacin Nausea Only and Other (See Comments)    Stomach ache  . Hydrocodone-Acetaminophen Other (See Comments)    Hallucinations in higher doses  . Other Other (See Comments)    Rash from neoprene wrap after knee surgery  . Shellfish Allergy Nausea And Vomiting and Other (See Comments)    Reaction to scallops  . Tramadol Other (See Comments)    Pt becomes combative per wife  . Tikosyn [Dofetilide]     On 12/23/17 Torsades in setting of sepsis and on Tikosyn. (Dr. Caryl Comes noted: patient had a late development following presentation with septic shock of torsade de pointes.  Renal function all relatively normal on presentation had gone from 1.1--1.7 in the consequence of a shock.  This and volume resuscitation which can acutely lengthen QT intervals likely contributed to the electrical instability in the torsade de pointes).   Review of Systems  Unable to perform ROS: Other    Physical Exam  Constitutional: He is oriented to person, place, and time.  Somnolent, ill-appearing elderly man in no acute distress  HENT:  Head: Normocephalic and atraumatic.  Neck: Normal range of motion.  Cardiovascular: Normal rate.  Pulmonary/Chest:  Weak respiratory effort  Genitourinary:  Genitourinary Comments: Foley catheter  Musculoskeletal: Normal range of motion.  Very weak, can help turn himself in bed but is unable to stand on his own  Neurological: He is alert and oriented to person,  place, and time.  Short-term memory deficits noted  Skin: Skin is warm and dry. There is pallor.  Psychiatric:  Patient is somnolent but easily awakens when asked a question No overt agitation otherwise unable to test  Nursing note and vitals reviewed.   Vital Signs: BP (!) 120/51 (BP Location: Left Arm)   Pulse 65   Temp 97.8 F (36.6 C) (Oral)   Resp 17   Ht 6' 3" (1.905 m)   Wt 99.8 kg (220 lb)   SpO2 100%   BMI 27.50 kg/m  Pain Scale: 0-10   Pain Score: 2    SpO2: SpO2: 100 % O2 Device:SpO2: 100 % O2 Flow Rate: .   IO: Intake/output summary:   Intake/Output Summary (Last 24 hours) at 01/20/2018 1704 Last data filed at 01/20/2018 1521 Gross per 24 hour  Intake 150 ml  Output 400 ml  Net -250 ml    LBM: Last BM Date: 01/18/18 Baseline Weight: Weight: 99.8 kg (220 lb) Most recent weight: Weight: 99.8 kg (220 lb)     Palliative Assessment/Data:  Flowsheet Rows     Most Recent Value  Intake Tab  Referral Department  Hospitalist  Unit at Time of Referral  Med/Surg Unit  Palliative Care Primary Diagnosis  Cardiac  Date Notified  01/20/18  Palliative Care Type  New Palliative care  Reason for referral  Clarify Goals of Care, Counsel Regarding Hospice, Psychosocial or Spiritual support  Date of Admission  01/19/18  Date first seen by Palliative Care  01/20/18  # of days Palliative referral response time  0 Day(s)  # of days IP prior to Palliative referral  1  Clinical Assessment  Palliative Performance Scale Score  40%  Pain Max last 24 hours  Not able to report  Pain Min Last 24 hours  Not able to report  Dyspnea Max Last 24 Hours  Not able to report  Dyspnea Min Last 24 hours  Not able to report  Nausea Max Last 24 Hours  Not able to report  Nausea Min Last 24 Hours  Not able to report  Anxiety Max Last 24 Hours  Not able to report  Anxiety Min Last 24 Hours  Not able to report  Other Max Last 24 Hours  Not able to report  Psychosocial & Spiritual  Assessment  Palliative Care Outcomes  Patient/Family meeting held?  Yes  Who was at the meeting?  pt, spouse  Patient/Family wishes: Interventions discontinued/not started   Mechanical Ventilation, Vasopressors, Trach, PEG, Hemodialysis, Tube feedings/TPN  Palliative Care follow-up planned  No      Time In: 1600 Time Out: 1715 Time Total: 75 min Greater than 50%  of this time was spent counseling and coordinating care related to the above assessment and plan. Discussed with CSW as well as Dr. Karleen Hampshire  Signed by: Dory Horn, NP   Please contact Palliative Medicine Team phone at [phone] for questions and concerns.  For individual provider: See Shea Evans

## 2018-01-21 MED ORDER — ENSURE ENLIVE PO LIQD
237.0000 mL | Freq: Two times a day (BID) | ORAL | Status: DC
  Administered 2018-01-21 – 2018-01-23 (×5): 237 mL via ORAL

## 2018-01-21 MED ORDER — LORAZEPAM 2 MG/ML IJ SOLN
1.0000 mg | INTRAMUSCULAR | Status: DC | PRN
  Administered 2018-01-21: 1 mg via INTRAVENOUS
  Filled 2018-01-21: qty 1

## 2018-01-21 MED ORDER — KETOROLAC TROMETHAMINE 15 MG/ML IJ SOLN
15.0000 mg | Freq: Once | INTRAMUSCULAR | Status: AC
  Administered 2018-01-21: 15 mg via INTRAVENOUS
  Filled 2018-01-21: qty 1

## 2018-01-21 NOTE — Care Management Note (Addendum)
Case Management Note  Patient Details  Name: TRASON SHIFFLET MRN: [id number] Date of Birth: Apr 23, 1934  Subjective/Objective:  Weakness, HTN                Action/Plan: NCM spoke to pt's wife, [name] via phone. Offered choice for Home Hospice. She is requesting [facility]. Explained Hospice with contact her today to arrange meeting to set up for DME and answer any questions about Home Hospice. Wife states she has aide already set up with his [facility] [place]. [place] with new referral. Spoke to [place] and she will follow up with wife to arrange meeting.   Expected Discharge Date:  01/22/2018               Expected Discharge Plan:  Home w Hospice Care  In-House Referral:  Clinical Social Work  Discharge planning Services  CM Consult  Post Acute Care Choice:  Hospice Choice offered to:  Spouse  DME Arranged:  Hospital bed DME Agency:  [place]:  RN [facility] Agency:  [place]  Status of Service:  Completed, signed off  If discussed at [employer], dates discussed:    Additional Comments:  Erenest Rasher, RN 01/21/2018, 8:49 AM

## 2018-01-21 NOTE — Progress Notes (Addendum)
NCM spoke with Roxanne with [facility] she states they just got referral and she has not met with the family yet, most likely plan for dc tomorrow home with hospice.   13:46 Roxanne with [facility] called NCM and stated she spoke with patient and wife, they are ordering the DME today for delivery tomorrow to the home, and patient will be getting one more day of iv abx, per RN plan for dc on Monday.

## 2018-01-21 NOTE — Progress Notes (Signed)
Patient became disoriented, agitated and threatened to hit this RN with his call bell if she gets closer. Patient  c/o pain on his buttock after taking Tylenol 1000mg .  Refused to be repositioned on his side. Patient started yelling on top of his voice, "get me out of here." This RN was going to wrap his IV site with gauze to stop it from beeping. Patient took his call bell and threatened to hit  if I come closer. Security was called to patient's bedside. Paged and talked to Dr. Darene Lamer. [facility] and received a new order for Toradol 15 mg Iv x 1 dose and Ativan 1 mg IV every 4 hours for anxiety. Patient received both medications and offered a bath. Patient is calmed and resting at this time. Will continue to monitor.

## 2018-01-21 NOTE — Consult Note (Signed)
[facility] [place]: Met with Pt and family to discuss hospice services and criteria for home hospice. They are in agreement and wish to discharge with hospice services. Approved for home care admission at discharge which wife anticipates to be Monday. Wishes for him to receive all IV antibiotics prior to discharging home. DME to be ordered for delivery on Sunday. DME to be ordered; Hospital Bed, OBT, 02 set up, owns [facility] and has walker and wheelchair in home provided by [facility] which hospice will take over billing once enrolled. Please call with any questions or needs. Thank you for the opportunity to serve this patient and family. Doroteo Glassman, RN [facility] [phone]

## 2018-01-21 NOTE — Progress Notes (Signed)
Nutrition Brief Note  RD consulted for assessment. Pt eating poorly.  It appears that patient/family have opted for taking patient home with hospice. Should be discharging either tomorrow or Monday.   Briefly met with patient. He displays some confusion. He broadly states he just does not care for the hospital food. He could only name a couple foods he would desire. He says he likes Ensure.   Will order these requests.   Per Palliative Care Note, NP recommends comfort feedings. Feel that diet liberalization to REGULAR is warranted.   Patient to discharge with hospice.  Burtis Junes RD, LDN, CNSC Clinical Nutrition Available Tues-Sat via Pager: [phone] 01/21/2018 3:16 PM

## 2018-01-21 NOTE — Progress Notes (Signed)
PROGRESS NOTE    Christopher Baker  PJK:[id number] DOB: 09-29-1933 DOA: 01/19/2018 PCP: Haywood Pao, MD    Brief Narrative:  Christopher Baker is a 84 y.o. male with medical history significant of diabetes, hypertension, chronic back pain, proximal A. fib, history of stroke, recent hospitalization with V. tach arrest and urosepsis was just discharged from short-term rehab yesterday to home.  Wife reports that he could not walk when he was discharged yesterday from rehab center.  They are bringing him back today for continued weakness and the need for placement.     Assessment & Plan:   Principal Problem:   Weakness generalized Active Problems:   Essential hypertension   Atrial fibrillation, chronic (HCC)   Pacemaker-Medtronic   Dementia without behavioral disturbance   Anemia of chronic disease   History of CVA (cerebrovascular accident)   Chronic diastolic heart failure (HCC)   Acute lower UTI   Generalized weakness:  Suspect from deconditioning. As per notes, the wife was not able to take care of him at home for ADL'S. Since he has poor po intake, clinically deterioration , recommending palliative care consult for goals of care and they have decided for home with hospice after the equipment is delivered to the house which will probably happen tomorrow.    Hypertension:  Well controlled.    H/o CVA: No new complaints.  Resume eliquis.    Hyperlipidemia:    Chronic diastolic heart failure.  Compensated.    Dementia:  Resume aricept.   Stage 3 CKD: Creatinine appears to be at baseline.    Anemia of chronic disease:  Hemoglobin stable around 9.   Urinary tract infection:  On IV rocephin and IV fluids.  Urine cultures show multiple species present. Resume IV rocephin till discharge tomorrow.    Chronic atrial fibrillation Rate controlled. On eliquis for anti coagulation.      DVT prophylaxis: eliquis. Code Status: PARTIAL. Family Communication:  none at bedside.  Disposition Plan: pending clinical improvement.    Consultants:   Physical therapy.   Palliative care.   Nutrition.    Procedures: none    Antimicrobials: rocephin. Since admission.     Subjective: Wants to know when he can go home, reports some back pain.  RN reported he was agitated last night and calmed down with some ativan.  He is alert and answering questions appropriately.   Objective: Vitals:   01/20/18 0532 01/20/18 1521 01/20/18 2154 01/21/18 0509  BP: (!) 123/52 (!) 120/51 (!) 118/35 (!) 116/51  Pulse: (!) 59 65 70 (!) 58  Resp: 18 17 18 18   Temp: (!) 97.4 F (36.3 C) 97.8 F (36.6 C) 98.6 F (37 C) 97.7 F (36.5 C)  TempSrc: Oral Oral Oral   SpO2: 99% 100% 100% 100%  Weight:      Height:        Intake/Output Summary (Last 24 hours) at 01/21/2018 1304 Last data filed at 01/21/2018 0900 Gross per 24 hour  Intake 320 ml  Output 1800 ml  Net -1480 ml   Filed Weights   01/19/18 1027  Weight: 99.8 kg (220 lb)    Examination:  General exam: calm , not in distress.  Respiratory system: Clear to auscultation. Respiratory effort normal. No wheezing or rhonchi.  Cardiovascular system: S1 & S2 heard, irregular, No pedal edema. Gastrointestinal system: Abdomen is soft non tender non distended bowel sounds good.  Central nervous system: Alert and oriented to person  only.  Extremities: Symmetric 5  x 5 power. Skin: No rashes, lesions or ulcers Psychiatry: Mood & affect appropriate.     Data Reviewed: I have personally reviewed following labs and imaging studies  CBC: Recent Labs  Lab 01/19/18 1045 01/20/18 0813  WBC 14.2* 9.8  NEUTROABS 12.5*  --   HGB 9.5* 9.5*  HCT 32.1* 32.5*  MCV 90.9 89.8  PLT 303 161   Basic Metabolic Panel: Recent Labs  Lab 01/16/18 0518 01/19/18 1045 01/20/18 0813  NA 141 143 142  K 3.7 4.0 3.8  CL 107 109 107  CO2 26 27 22   GLUCOSE 115* 101* 97  BUN 26* 30* 23  CREATININE 1.28* 1.50* 1.29*   CALCIUM 9.5 9.5 9.4   GFR: Estimated Creatinine Clearance: 52.8 mL/min (A) (by C-G formula based on SCr of 1.29 mg/dL (H)). Liver Function Tests: Recent Labs  Lab 01/19/18 1045  AST 29  ALT 36  ALKPHOS 123  BILITOT 0.6  PROT 5.5*  ALBUMIN 2.4*   No results for input(s): LIPASE, AMYLASE in the last 168 hours. No results for input(s): AMMONIA in the last 168 hours. Coagulation Profile: No results for input(s): INR, PROTIME in the last 168 hours. Cardiac Enzymes: Recent Labs  Lab 01/19/18 1045 01/19/18 1515 01/19/18 2029 01/20/18 0243 01/20/18 0813  CKTOTAL 25*  --   --   --   --   TROPONINI 0.05* 0.05* 0.05* 0.05* 0.04*   BNP (last 3 results) No results for input(s): PROBNP in the last 8760 hours. HbA1C: No results for input(s): HGBA1C in the last 72 hours. CBG: No results for input(s): GLUCAP in the last 168 hours. Lipid Profile: No results for input(s): CHOL, HDL, LDLCALC, TRIG, CHOLHDL, LDLDIRECT in the last 72 hours. Thyroid Function Tests: No results for input(s): TSH, T4TOTAL, FREET4, T3FREE, THYROIDAB in the last 72 hours. Anemia Panel: No results for input(s): VITAMINB12, FOLATE, FERRITIN, TIBC, IRON, RETICCTPCT in the last 72 hours. Sepsis Labs: No results for input(s): PROCALCITON, LATICACIDVEN in the last 168 hours.  Recent Results (from the past 240 hour(s))  Urine Culture     Status: Abnormal   Collection Time: 01/19/18  5:16 PM  Result Value Ref Range Status   Specimen Description URINE, RANDOM  Final   Special Requests   Final    NONE Performed at [facility], [address] [place], [place] [address]    Culture MULTIPLE SPECIES PRESENT, SUGGEST RECOLLECTION (A)  Final   Report Status 01/20/2018 FINAL  Final         Radiology Studies: No results found.      Scheduled Meds: . acetaminophen  1,000 mg Oral BID  . apixaban  5 mg Oral BID  . atorvastatin  20 mg Oral Daily  . cyanocobalamin  1,000 mcg Intramuscular Q30 days  .  donepezil  10 mg Oral QHS  . DULoxetine  30 mg Oral QHS  . ezetimibe  10 mg Oral Daily  . ferrous sulfate  325 mg Oral BID WC  . folic acid  1 mg Oral QHS  . Gerhardt's butt cream  1 application Topical QID  . metoprolol succinate  25 mg Oral QHS  . pantoprazole  40 mg Oral Daily  . potassium chloride SA  20 mEq Oral Daily  . predniSONE  5 mg Oral Q breakfast  . sodium chloride flush  3 mL Intravenous Q12H  . tamsulosin  0.4 mg Oral Daily  . [START ON 01/22/2018] Vitamin D (Ergocalciferol)  50,000 Units Oral Q Sun  Continuous Infusions: . sodium chloride 10 mL/hr at 01/20/18 2208  . sodium chloride    . cefTRIAXone (ROCEPHIN)  IV 1 g (01/20/18 2210)     LOS: 1 day    Time spent: 35 minutes.     Hosie Poisson, MD [facility] Pager [phone]  If 7PM-7AM, please contact night-coverage [email or web address] Password [email or web address] 01/21/2018, 1:04 PM

## 2018-01-22 MED ORDER — OXYCODONE-ACETAMINOPHEN 5-325 MG PO TABS
1.0000 | ORAL_TABLET | ORAL | Status: DC | PRN
  Administered 2018-01-22 – 2018-01-23 (×3): 1 via ORAL
  Filled 2018-01-22 (×4): qty 1

## 2018-01-22 MED ORDER — SODIUM CHLORIDE 0.9 % IV SOLN
INTRAVENOUS | Status: DC | PRN
  Administered 2018-01-22 (×2): via INTRAVENOUS

## 2018-01-22 NOTE — Progress Notes (Signed)
5W 37 urine 627ml red bloody, last Hg 01/20/18 was 9.5  Messaged [facility] MD

## 2018-01-22 NOTE — Progress Notes (Signed)
PROGRESS NOTE    Christopher Baker  WKG:[id number] DOB: Nov 18, 1933 DOA: 01/19/2018 PCP: Haywood Pao, MD    Brief Narrative:  Christopher Baker is a 84 y.o. male with medical history significant of diabetes, hypertension, chronic back pain, proximal A. fib, history of stroke, recent hospitalization with V. tach arrest and urosepsis was just discharged from short-term rehab yesterday to home.  Wife reports that he could not walk when he was discharged yesterday from rehab center.  They are bringing him back today for continued weakness and the need for placement.     Assessment & Plan:   Principal Problem:   Weakness generalized Active Problems:   Essential hypertension   Atrial fibrillation, chronic (HCC)   Pacemaker-Medtronic   Dementia without behavioral disturbance   Anemia of chronic disease   History of CVA (cerebrovascular accident)   Chronic diastolic heart failure (HCC)   Acute lower UTI   Generalized weakness:  Suspect from deconditioning. As per notes, the wife was not able to take care of him at home for ADL'S. Since he has poor po intake, clinically deterioration , recommended palliative care consult for goals of care and they have decided for home with hospice after the equipment is delivered to the house which will probably happen tomorrow.  Pt continues to decline , reports worsening back pain and percocet ordered.    Hematuria:  Unclear etiology. Stopped eliquis after speaking to his wife.  Ordered gentle hydration.    Hypertension:  Well controlled.    H/o CVA: No new complaints.     Hyperlipidemia:    Chronic diastolic heart failure.  Compensated.    Dementia:  Resume aricept.   Stage 3 CKD: Creatinine appears to be at baseline.    Anemia of chronic disease:  Hemoglobin stable around 9.   Urinary tract infection:  On IV rocephin and IV fluids.  Urine cultures show multiple species present. Resume IV rocephin till discharge tomorrow.      Chronic atrial fibrillation Rate controlled.  Wife understands therisk of stroke without anticoagulation.       DVT prophylaxis: eliquis. Code Status: PARTIAL. Family Communication: none at bedside.  Disposition Plan: pending improvement in hematuria.  Possibly tomorrow.    Consultants:   Physical therapy.   Palliative care.   Nutrition.    Procedures: none    Antimicrobials: rocephin. Since admission.     Subjective: He reports back pain. Wants to go home.   Objective: Vitals:   01/21/18 1415 01/21/18 2125 01/22/18 0452 01/22/18 1413  BP: (!) 121/51 (!) 140/49 122/73 (!) 125/58  Pulse: (!) 59 71 62 62  Resp: 20 18 17 20   Temp: 97.8 F (36.6 C) 98.2 F (36.8 C) (!) 97.5 F (36.4 C) 98.2 F (36.8 C)  TempSrc: Oral Oral Oral Oral  SpO2: 100% 100% 100% 97%  Weight:      Height:        Intake/Output Summary (Last 24 hours) at 01/22/2018 1738 Last data filed at 01/22/2018 1600 Gross per 24 hour  Intake 730.24 ml  Output 825 ml  Net -94.76 ml   Filed Weights   01/19/18 1027  Weight: 99.8 kg (220 lb)    Examination:  General exam: in mild distress from back pain.  Respiratory system: air entry fair.   Cardiovascular system: S1 & S2 heard, irregular, No pedal edema. Gastrointestinal system: Abdomen is soft , supra pubic tenderness.  Central nervous system: Alert and oriented to person  only.  Extremities:  Symmetric 5 x 5 power. Skin: No rashes, lesions or ulcers Psychiatry: Mood & affect appropriate.     Data Reviewed: I have personally reviewed following labs and imaging studies  CBC: Recent Labs  Lab 01/19/18 1045 01/20/18 0813  WBC 14.2* 9.8  NEUTROABS 12.5*  --   HGB 9.5* 9.5*  HCT 32.1* 32.5*  MCV 90.9 89.8  PLT 303 086   Basic Metabolic Panel: Recent Labs  Lab 01/16/18 0518 01/19/18 1045 01/20/18 0813  NA 141 143 142  K 3.7 4.0 3.8  CL 107 109 107  CO2 26 27 22   GLUCOSE 115* 101* 97  BUN 26* 30* 23  CREATININE 1.28*  1.50* 1.29*  CALCIUM 9.5 9.5 9.4   GFR: Estimated Creatinine Clearance: 52.8 mL/min (A) (by C-G formula based on SCr of 1.29 mg/dL (H)). Liver Function Tests: Recent Labs  Lab 01/19/18 1045  AST 29  ALT 36  ALKPHOS 123  BILITOT 0.6  PROT 5.5*  ALBUMIN 2.4*   No results for input(s): LIPASE, AMYLASE in the last 168 hours. No results for input(s): AMMONIA in the last 168 hours. Coagulation Profile: No results for input(s): INR, PROTIME in the last 168 hours. Cardiac Enzymes: Recent Labs  Lab 01/19/18 1045 01/19/18 1515 01/19/18 2029 01/20/18 0243 01/20/18 0813  CKTOTAL 25*  --   --   --   --   TROPONINI 0.05* 0.05* 0.05* 0.05* 0.04*   BNP (last 3 results) No results for input(s): PROBNP in the last 8760 hours. HbA1C: No results for input(s): HGBA1C in the last 72 hours. CBG: No results for input(s): GLUCAP in the last 168 hours. Lipid Profile: No results for input(s): CHOL, HDL, LDLCALC, TRIG, CHOLHDL, LDLDIRECT in the last 72 hours. Thyroid Function Tests: No results for input(s): TSH, T4TOTAL, FREET4, T3FREE, THYROIDAB in the last 72 hours. Anemia Panel: No results for input(s): VITAMINB12, FOLATE, FERRITIN, TIBC, IRON, RETICCTPCT in the last 72 hours. Sepsis Labs: No results for input(s): PROCALCITON, LATICACIDVEN in the last 168 hours.  Recent Results (from the past 240 hour(s))  Urine Culture     Status: Abnormal   Collection Time: 01/19/18  5:16 PM  Result Value Ref Range Status   Specimen Description URINE, RANDOM  Final   Special Requests   Final    NONE Performed at [facility], [address] [place], [place] [address]    Culture MULTIPLE SPECIES PRESENT, SUGGEST RECOLLECTION (A)  Final   Report Status 01/20/2018 FINAL  Final         Radiology Studies: No results found.      Scheduled Meds: . acetaminophen  1,000 mg Oral BID  . atorvastatin  20 mg Oral Daily  . cyanocobalamin  1,000 mcg Intramuscular Q30 days  . donepezil  10  mg Oral QHS  . DULoxetine  30 mg Oral QHS  . ezetimibe  10 mg Oral Daily  . feeding supplement (ENSURE ENLIVE)  237 mL Oral BID BM  . ferrous sulfate  325 mg Oral BID WC  . folic acid  1 mg Oral QHS  . Gerhardt's butt cream  1 application Topical QID  . metoprolol succinate  25 mg Oral QHS  . pantoprazole  40 mg Oral Daily  . potassium chloride SA  20 mEq Oral Daily  . predniSONE  5 mg Oral Q breakfast  . sodium chloride flush  3 mL Intravenous Q12H  . tamsulosin  0.4 mg Oral Daily  . Vitamin D (Ergocalciferol)  50,000 Units  Oral Q Sun   Continuous Infusions: . sodium chloride Stopped (01/21/18 2233)  . sodium chloride 100 mL/hr at 01/22/18 1600  . cefTRIAXone (ROCEPHIN)  IV Stopped (01/21/18 2116)     LOS: 2 days    Time spent: 35 minutes.     Hosie Poisson, MD [facility] Pager [phone]  If 7PM-7AM, please contact night-coverage [email or web address] Password [facility] 01/22/2018, 5:38 PM

## 2018-01-22 NOTE — Progress Notes (Addendum)
Spoke w Dr Karleen Hampshire, patient now experiencing hematuria and will not DC today. Plan will remain for home w hospice when stable.

## 2018-01-22 NOTE — Progress Notes (Signed)
When collecting the patient's urinary output, it was noted that the urine resembled blood.  Earlier in the shift, the urine had been tea-colored.  Patient appears pale but is otherwise asymptomatic.  Paged [facility] provider T. Opyd, who gave no additional orders at this time.  Will continue to monitor.

## 2018-01-22 NOTE — Progress Notes (Signed)
OT Cancellation Note  Patient Details Name: Christopher Baker MRN: [id number] DOB: 05/29/1934   Cancelled Treatment:    Noted pt plans to DC with home hospice tomorrow. Noted DME ordered. Will sign off  [place], [name] 01/22/2018, 9:37 AM

## 2018-01-23 ENCOUNTER — Other Ambulatory Visit: Payer: Self-pay | Admitting: Licensed Clinical Social Worker

## 2018-01-23 ENCOUNTER — Inpatient Hospital Stay (HOSPITAL_COMMUNITY): Payer: PPO

## 2018-01-23 DIAGNOSIS — R310 Gross hematuria: Secondary | ICD-10-CM

## 2018-01-23 LAB — CBC
HEMATOCRIT: 30.6 % — AB (ref 39.0–52.0)
HEMOGLOBIN: 8.9 g/dL — AB (ref 13.0–17.0)
MCH: 26.4 pg (ref 26.0–34.0)
MCHC: 29.1 g/dL — AB (ref 30.0–36.0)
MCV: 90.8 fL (ref 78.0–100.0)
Platelets: 305 10*3/uL (ref 150–400)
RBC: 3.37 MIL/uL — AB (ref 4.22–5.81)
RDW: 16.1 % — ABNORMAL HIGH (ref 11.5–15.5)
WBC: 12.7 10*3/uL — ABNORMAL HIGH (ref 4.0–10.5)

## 2018-01-23 LAB — TYPE AND SCREEN
ABO/RH(D): A POS
ANTIBODY SCREEN: NEGATIVE

## 2018-01-23 MED ORDER — OXYCODONE-ACETAMINOPHEN 5-325 MG PO TABS: 1.0000 | ORAL_TABLET | ORAL | 0 refills | Status: AC | PRN

## 2018-01-23 MED ORDER — ENSURE ENLIVE PO LIQD: 237.0000 mL | Freq: Two times a day (BID) | ORAL | 12 refills | Status: AC

## 2018-01-23 MED ORDER — SODIUM CHLORIDE 0.9 % IV SOLN
INTRAVENOUS | Status: DC
  Administered 2018-01-23: 11:00:00 via INTRAVENOUS

## 2018-01-23 MED ORDER — LIDOCAINE 5 % EX PTCH
1.0000 | MEDICATED_PATCH | CUTANEOUS | Status: DC
  Administered 2018-01-23: 1 via TRANSDERMAL
  Filled 2018-01-23: qty 1

## 2018-01-23 MED ORDER — CEPHALEXIN 500 MG PO CAPS: 500.0000 mg | ORAL_CAPSULE | Freq: Two times a day (BID) | ORAL | 0 refills | Status: AC

## 2018-01-23 NOTE — Consult Note (Signed)
   [facility] CM Inpatient Consult   01/23/2018  RAYHAN GROLEAU August 15, 1933 [id number]    [facility] Care Management follow up.  Noted Mr. Moravek disposition plans are for home with hospice services.  [place] team aware.  Marthenia Rolling, MSN-Ed, RN,BSN [facility] Liaison [phone]

## 2018-01-23 NOTE — Progress Notes (Signed)
Informed spouse of Renal U/S

## 2018-01-23 NOTE — Care Management Note (Signed)
Case Management Note  Patient Details  Name: Christopher Baker MRN: [id number] Date of Birth: [birth year]  Subjective/Objective:     Weakness HTN               Action/Plan: Transition to home with home hospice care. [facility] called and timed pt  pick arranged for 2:30 pm, pt and nurse made aware.  Expected Discharge Date:  01/23/18               Expected Discharge Plan:  Home w Hospice Care  In-House Referral:  Clinical Social Work  Discharge planning Services  CM Consult  Post Acute Care Choice:  Hospice Choice offered to:  Spouse  DME Arranged:  Hospital bed DME Agency:  [place]:  RN [facility] Agency:  [place]  Status of Service:  Completed, signed off  If discussed at [employer], dates discussed:    Additional Comments:  Sharin Mons, RN 01/23/2018, 1:23 PM

## 2018-01-23 NOTE — Progress Notes (Signed)
Lab called now asking if Type and screen was needed from midnight,  I said yes

## 2018-01-23 NOTE — Consult Note (Signed)
[facility]: Met with wife answered questions. Spoke to RN on floor she will place foley cath prior to pt leaving today. Spoke to the pt's CM Hassan Rowan she has arranged ambulance to pick pt up at 230pm. Wife aware that hospice RN will be in home today between 5-530pm to do initial assessment. She has arranged for caregivers to help her in home. All equipment is in the home. Daughter also in home to await pt arrival. Wife at bedside. We will print discharge summary when ready.  Thank you for this referral. Webb Silversmith RN [phone]

## 2018-01-23 NOTE — Progress Notes (Signed)
[name] to be D/C'd to home with hospice per MD order.  Discussed with the patient and all questions fully answered.  IV catheter discontinued intact. Site without signs and symptoms of complications. Dressing and pressure applied.  An After Visit Summary was printed and given to the patient. Patient received prescriptions.  D/c education completed with patient/family including follow up instructions, medication list, d/c activities limitations if indicated, with other d/c instructions as indicated by MD - patient able to verbalize understanding, all questions fully answered.   Patient instructed to return to ED, call 911, or call MD for any changes in condition.   Patient escorted via stretcher, and D/C home via [facility].  Morley Kos Price 01/23/2018 2:58 PM

## 2018-01-23 NOTE — Patient Outreach (Signed)
[facility]) Care Management  01/23/2018  Christopher Baker [birth year] [id number]  [facility] CSW notified of patient's current hospitalization. Patient will discharge back home with [facility]. Spouse reports that she has an aide already set up with patient's long term care policy. [facility] CSW will complete case closure at this time as [facility] is now involved.  Eula Fried, BSW, MSW, [email or web address] Phone: [phone] Fax: [phone]

## 2018-01-24 ENCOUNTER — Ambulatory Visit: Payer: Self-pay

## 2018-01-24 NOTE — Discharge Summary (Signed)
Physician Discharge Summary  Christopher CHRISTIANSON HCW:[id number] DOB: 02/24/1934 DOA: 01/19/2018  PCP: Haywood Pao, MD  Admit date: 01/19/2018 Discharge date: 01/23/2018  Admitted From: home.  Disposition:  Home Hospice.   Recommendations for Outpatient Follow-up:   Please follow up with hospice MD as needed.   Discharge Condition: HOSPICE.  CODE STATUS COMFORT CARE.  Diet recommendation: REGULAR DIET.   Brief/Interim Summary: Christopher Gholson Hancockis a 84 y.o.malewith medical history significant ofdiabetes, hypertension, chronic back pain, proximal A. fib, history of stroke, recent hospitalization with V. tach arrest and urosepsis was just discharged from short-term rehab yesterday to home. Wife reports that he could not walk when he was discharged yesterday from rehab center. They are bringing him back today for continued weakness and the need for placement.     Discharge Diagnoses:  Principal Problem:   Weakness generalized Active Problems:   Essential hypertension   Atrial fibrillation, chronic (HCC)   Pacemaker-[employer]   Goals of care, counseling/discussion   Dementia without behavioral disturbance   Anemia of chronic disease   History of CVA (cerebrovascular accident)   Chronic diastolic heart failure (HCC)   Acute lower UTI   Palliative care by specialist   Shortness of breath   Generalized weakness  Generalized weakness:  Suspect from deconditioning. As per notes, the wife was not able to take care of him at home for ADL'S. Since he has poor po intake, clinically deterioration , recommended palliative care consult for goals of care and they have decided for home with hospice after the equipment is delivered to the house.  Pt continues to decline , reports worsening back pain and percocet ordered.    Hematuria:  Unclear etiology. Stopped eliquis after speaking to his wife.  Ordered gentle hydration.    Hypertension:  Well controlled.    H/o CVA: No new  complaints.     Hyperlipidemia:    Chronic diastolic heart failure.  Compensated.    Dementia:  Resume aricept.   Stage 3 CKD: Creatinine appears to be at baseline.    Anemia of chronic disease:  Hemoglobin stable around 9.   Urinary tract infection:  On IV rocephin and IV fluids.  Urine cultures show multiple species present. Resume IV rocephin till discharge.    Chronic atrial fibrillation Rate controlled.  Wife understands therisk of stroke without anticoagulation.        Discharge Instructions  Discharge Instructions    Diet - low sodium heart healthy   Complete by:  As directed    Discharge instructions   Complete by:  As directed    Follow up with home hospice.     Allergies as of 01/23/2018      Reactions   Ciprofloxacin Nausea Only, Other (See Comments)   Stomach ache   Hydrocodone-acetaminophen Other (See Comments)   Hallucinations in higher doses   Other Other (See Comments)   Rash from neoprene wrap after knee surgery   Shellfish Allergy Nausea And Vomiting, Other (See Comments)   Reaction to scallops   Tramadol Other (See Comments)   Pt becomes combative per wife   Tikosyn [dofetilide]    On 12/23/17 Torsades in setting of sepsis and on Tikosyn. (Dr. Caryl Comes noted: patient had a late development following presentation with septic shock of torsade de pointes.  Renal function all relatively normal on presentation had gone from 1.1--1.7 in the consequence of a shock.  This and volume resuscitation which can acutely lengthen QT intervals likely contributed to the electrical  instability in the torsade de pointes).      Medication List    STOP taking these medications   acetaminophen 500 MG tablet Commonly known as:  TYLENOL   apixaban 5 MG Tabs tablet Commonly known as:  ELIQUIS   atorvastatin 20 MG tablet Commonly known as:  LIPITOR   cyanocobalamin 1000 MCG/ML injection Commonly known as:  (VITAMIN B-12)   ezetimibe 10 MG  tablet Commonly known as:  ZETIA   ferrous sulfate 325 (65 FE) MG tablet   folic acid 1 MG tablet Commonly known as:  FOLVITE   furosemide 40 MG tablet Commonly known as:  LASIX   methotrexate 15 MG tablet Commonly known as:  RHEUMATREX   potassium chloride SA 20 MEQ tablet Commonly known as:  K-DUR,KLOR-CON     TAKE these medications   cephALEXin 500 MG capsule Commonly known as:  KEFLEX Take 1 capsule (500 mg total) by mouth 2 (two) times daily for 5 days.   donepezil 10 MG tablet Commonly known as:  ARICEPT Take 1 tablet (10 mg total) by mouth at bedtime.   DULoxetine 30 MG capsule Commonly known as:  CYMBALTA Take 1 capsule (30 mg total) by mouth at bedtime.   feeding supplement (ENSURE ENLIVE) Liqd Take 237 mLs by mouth 2 (two) times daily between meals.   Gerhardt's butt cream Crea Apply 1 application topically 4 (four) times daily.   lidocaine 5 % Commonly known as:  LIDODERM Place 1 patch onto the skin daily. Remove & Discard patch within 12 hours or as directed by MD   LUBRICANT EYE OP Place 2 drops into both eyes daily as needed (for dry eyes).   metoprolol succinate 25 MG 24 hr tablet Commonly known as:  TOPROL XL Take 1 tablet (25 mg total) by mouth at bedtime.   oxyCODONE-acetaminophen 5-325 MG tablet Commonly known as:  PERCOCET/ROXICET Take 1 tablet by mouth every 4 (four) hours as needed for moderate pain or severe pain.   pantoprazole 40 MG tablet Commonly known as:  PROTONIX Take 1 tablet (40 mg total) by mouth daily. What changed:  when to take this   predniSONE 5 MG tablet Commonly known as:  DELTASONE Take 1 tablet (5 mg total) by mouth daily with breakfast.   tamsulosin 0.4 MG Caps capsule Commonly known as:  FLOMAX Take 1 capsule (0.4 mg total) by mouth daily.   Vitamin D (Ergocalciferol) 50000 units Caps capsule Commonly known as:  DRISDOL Take 50,000 Units by mouth every Sunday.      Follow-up Information    [facility] Follow up.   Why:  Hospice RN-agency will call arrange initial visit Contact information: [place] [address] [phone]          Allergies  Allergen Reactions  . Ciprofloxacin Nausea Only and Other (See Comments)    Stomach ache  . Hydrocodone-Acetaminophen Other (See Comments)    Hallucinations in higher doses  . Other Other (See Comments)    Rash from neoprene wrap after knee surgery  . Shellfish Allergy Nausea And Vomiting and Other (See Comments)    Reaction to scallops  . Tramadol Other (See Comments)    Pt becomes combative per wife  . Tikosyn [Dofetilide]     On 12/23/17 Torsades in setting of sepsis and on Tikosyn. (Dr. Caryl Comes noted: patient had a late development following presentation with septic shock of torsade de pointes.  Renal function all relatively normal on presentation had  gone from 1.1--1.7 in the consequence of a shock.  This and volume resuscitation which can acutely lengthen QT intervals likely contributed to the electrical instability in the torsade de pointes).    Consultations:  Palliative care  Urology Dr Jeffie Pollock over the phone.    Procedures/Studies: Ct Abdomen Wo Contrast  Result Date: 12/27/2017 CLINICAL DATA:  Nonpulsatile abdominal mass. Recent fall. Improving pulmonary opacities with diuresis. EXAM: CT CHEST AND ABDOMEN WITHOUT CONTRAST TECHNIQUE: Multidetector CT imaging of the chest and abdomen was performed following the standard protocol without intravenous contrast. COMPARISON:  Recent chest radiographs. Pelvic CT 03/08/2016, chest CT 06/25/2015 and abdominopelvic CT 02/25/2015. FINDINGS: CT CHEST FINDINGS WITHOUT CONTRAST Cardiovascular: Diffuse atherosclerosis of the aorta, great vessels and coronary arteries. No acute vascular findings are seen on noncontrast imaging. The heart is mildly enlarged. Patient has a left subclavian pacemaker with leads in stable position. Probable aortic valvular calcifications.  No significant pericardial effusion. Mediastinum/Nodes: There are no enlarged mediastinal, hilar or axillary lymph nodes. Small mediastinal lymph nodes are stable. The esophagus and thyroid gland appear unremarkable. Lungs/Pleura: Small dependent bilateral pleural effusions with associated bibasilar atelectasis. There is asymmetric interstitial prominence throughout the right lung as seen on recent radiographs. There is underlying moderate centrilobular emphysema. No focal nodule or endobronchial lesion seen. Musculoskeletal/Chest wall: No chest wall mass or suspicious osseous findings. A suggested midsternal fracture on sagittal image 110/7 is attributed to breathing artifact based on the axial images and is not associated with any surrounding soft tissue swelling. Right shoulder reverse arthroplasty and muscular atrophy noted. CT ABDOMEN FINDINGS Hepatobiliary: Multiple hepatic cysts appear stable. There is no significant biliary dilatation status post cholecystectomy. Pancreas: Unremarkable. No pancreatic ductal dilatation or surrounding inflammatory changes. Spleen: Normal in size without focal abnormality. Adrenals/Urinary Tract: Both adrenal glands appear normal. Stable nonobstructing 5 mm calculus in the lower pole of the right kidney (image 82/3). No evidence of hydronephrosis, proximal ureteral calculus or asymmetric perinephric soft tissue stranding. Numerous bilateral renal cysts are grossly unchanged in size and appearance. Stomach/Bowel: No evidence of bowel wall thickening, distention or surrounding inflammatory change. Vascular/Lymphatic: There are no enlarged abdominal lymph nodes. There are stable small lymph nodes in the porta hepatis. There is diffuse aortic and branch vessel atherosclerosis. Other: The visualized anterior abdominal wall is intact. No ascites. No abdominal wall mass or hernia seen. Musculoskeletal: No acute or significant osseous findings. Multilevel spondylosis status post L4-5  fusion. Disc degeneration appears progressive at L2-3 and L3-4. IMPRESSION: 1. In correlation with recent chest radiographs, asymmetric interstitial opacities in the right lung, bilateral pleural effusions and bibasilar atelectasis probably reflect resolving pulmonary edema superimposed on emphysema. Continued chest radiographic follow up recommended. Emphysema (ICD10-J43.9). 2. No acute findings seen within the abdomen. 3. Stable nonobstructing right renal calculus and bilateral renal cysts. No hydronephrosis or acute inflammatory changes seen. 4. Aortic Atherosclerosis (ICD10-I70.0). Progressive lumbar disc degeneration. 5. No evidence of abdominal mass or definite acute osseous findings. Suggested sternal fracture on the sagittal images is attributed to breathing artifact. Correlate clinically. Electronically Signed   By: Richardean Sale M.D.   On: 12/27/2017 17:36   Dg Chest 2 View  Result Date: 01/19/2018 CLINICAL DATA:  84 year old male with weakness and shortness of breath. EXAM: CHEST - 2 VIEW COMPARISON:  Portable chest 12/28/2017 and earlier. Chest CT 12/27/2017. FINDINGS: Progressed pulmonary vascularity since 12/28/2017 and resolved small pleural effusions. Stable cardiomegaly. Stable left chest 3 lead cardiac pacemaker. No pneumothorax or acute pulmonary opacity. Visualized tracheal air  column is within normal limits. No acute osseous abnormality identified. Chronic postoperative changes at the right shoulder. Negative visible bowel gas pattern. IMPRESSION: Resolved interstitial edema and pleural effusions seen last month. No acute cardiopulmonary abnormality. Electronically Signed   By: Genevie Ann M.D.   On: 01/19/2018 11:25   Ct Head Wo Contrast  Result Date: 01/19/2018 CLINICAL DATA:  Generalized weakness. EXAM: CT HEAD WITHOUT CONTRAST TECHNIQUE: Contiguous axial images were obtained from the base of the skull through the vertex without intravenous contrast. COMPARISON:  CT scan of March 30, 2016. FINDINGS: Brain: Mild diffuse cortical atrophy is noted. Old right posterior parietal infarction is noted. Mild chronic ischemic white matter disease is noted. No mass effect or midline shift is noted. Ventricular size is within normal limits. There is no evidence of mass lesion, hemorrhage or acute infarction. Old right basal ganglia infarction is noted. Vascular: No hyperdense vessel or unexpected calcification. Skull: Normal. Negative for fracture or focal lesion. Sinuses/Orbits: No acute finding. Other: None. IMPRESSION: Mild diffuse cortical atrophy. Old right posterior parietal infarction. Mild chronic ischemic white matter disease. No acute intracranial abnormality seen. Electronically Signed   By: Marijo Conception, M.D.   On: 01/19/2018 12:05   Ct Chest Wo Contrast  Result Date: 12/27/2017 CLINICAL DATA:  Nonpulsatile abdominal mass. Recent fall. Improving pulmonary opacities with diuresis. EXAM: CT CHEST AND ABDOMEN WITHOUT CONTRAST TECHNIQUE: Multidetector CT imaging of the chest and abdomen was performed following the standard protocol without intravenous contrast. COMPARISON:  Recent chest radiographs. Pelvic CT 03/08/2016, chest CT 06/25/2015 and abdominopelvic CT 02/25/2015. FINDINGS: CT CHEST FINDINGS WITHOUT CONTRAST Cardiovascular: Diffuse atherosclerosis of the aorta, great vessels and coronary arteries. No acute vascular findings are seen on noncontrast imaging. The heart is mildly enlarged. Patient has a left subclavian pacemaker with leads in stable position. Probable aortic valvular calcifications. No significant pericardial effusion. Mediastinum/Nodes: There are no enlarged mediastinal, hilar or axillary lymph nodes. Small mediastinal lymph nodes are stable. The esophagus and thyroid gland appear unremarkable. Lungs/Pleura: Small dependent bilateral pleural effusions with associated bibasilar atelectasis. There is asymmetric interstitial prominence throughout the right lung as seen on  recent radiographs. There is underlying moderate centrilobular emphysema. No focal nodule or endobronchial lesion seen. Musculoskeletal/Chest wall: No chest wall mass or suspicious osseous findings. A suggested midsternal fracture on sagittal image 110/7 is attributed to breathing artifact based on the axial images and is not associated with any surrounding soft tissue swelling. Right shoulder reverse arthroplasty and muscular atrophy noted. CT ABDOMEN FINDINGS Hepatobiliary: Multiple hepatic cysts appear stable. There is no significant biliary dilatation status post cholecystectomy. Pancreas: Unremarkable. No pancreatic ductal dilatation or surrounding inflammatory changes. Spleen: Normal in size without focal abnormality. Adrenals/Urinary Tract: Both adrenal glands appear normal. Stable nonobstructing 5 mm calculus in the lower pole of the right kidney (image 82/3). No evidence of hydronephrosis, proximal ureteral calculus or asymmetric perinephric soft tissue stranding. Numerous bilateral renal cysts are grossly unchanged in size and appearance. Stomach/Bowel: No evidence of bowel wall thickening, distention or surrounding inflammatory change. Vascular/Lymphatic: There are no enlarged abdominal lymph nodes. There are stable small lymph nodes in the porta hepatis. There is diffuse aortic and branch vessel atherosclerosis. Other: The visualized anterior abdominal wall is intact. No ascites. No abdominal wall mass or hernia seen. Musculoskeletal: No acute or significant osseous findings. Multilevel spondylosis status post L4-5 fusion. Disc degeneration appears progressive at L2-3 and L3-4. IMPRESSION: 1. In correlation with recent chest radiographs, asymmetric interstitial opacities in the  right lung, bilateral pleural effusions and bibasilar atelectasis probably reflect resolving pulmonary edema superimposed on emphysema. Continued chest radiographic follow up recommended. Emphysema (ICD10-J43.9). 2. No acute  findings seen within the abdomen. 3. Stable nonobstructing right renal calculus and bilateral renal cysts. No hydronephrosis or acute inflammatory changes seen. 4. Aortic Atherosclerosis (ICD10-I70.0). Progressive lumbar disc degeneration. 5. No evidence of abdominal mass or definite acute osseous findings. Suggested sternal fracture on the sagittal images is attributed to breathing artifact. Correlate clinically. Electronically Signed   By: Richardean Sale M.D.   On: 12/27/2017 17:36   US Renal  Result Date: 01/23/2018 CLINICAL DATA:  84 y/o  M; gross hematuria. EXAM: RENAL / URINARY TRACT ULTRASOUND COMPLETE COMPARISON:  12/27/2017 CT abdomen. FINDINGS: Right Kidney: Length: 10.4 cm. Echogenicity within normal limits. No mass or hydronephrosis visualized. Left Kidney: Length: 10.8 cm. Echogenicity within normal limits. Multiple anechoic simple cysts. The largest cysts are present within the upper cortex measuring 2.9 cm and near the hilum at the lower pole measuring 3.4 cm. Bladder: Material within the dependent bladder without blood flow on color Doppler. Bladder wall thickening. IMPRESSION: Bladder wall thickening with dependent debris which may reflect cystitis or blood products. Left kidney simple cysts. Electronically Signed   By: Kristine Garbe M.D.   On: 01/23/2018 01:13   US Renal  Result Date: 12/27/2017 CLINICAL DATA:  84 year old with personal history of prostate cancer, presenting with urinary tract infection and renal insufficiency. Current history of hypertension and diabetes. EXAM: RENAL / URINARY TRACT ULTRASOUND COMPLETE COMPARISON:  CT abdomen and pelvis 02/25/2015. FINDINGS: Right Kidney: Length: Approximately 10.9 cm. Echogenic renal parenchyma. Approximate 1.8 x 1.9 x 1.2 cm cyst arising from the UPPER pole, with smaller cysts elsewhere in the kidney, as noted on the prior CT. No visible solid masses. No hydronephrosis. Left Kidney: Length: Approximately 12.7 cm. Echogenic  renal parenchyma. Multiple cysts as noted on the prior CT, the largest arising from the mid kidney measuring approximately 4.0 x 3.4 x 4.7 cm. No visible solid masses. No hydronephrosis. Bladder: Decompressed by Foley catheter. IMPRESSION: 1. Echogenic renal parenchyma bilaterally indicating medical renal disease. 2. No evidence of hydronephrosis involving either kidney to suggest obstruction. 3. Multiple BILATERAL renal cysts as noted on a prior CT in 2016. Electronically Signed   By: Evangeline Dakin M.D.   On: 12/27/2017 12:42   Dg Chest Port 1 View  Result Date: 12/28/2017 CLINICAL DATA:  Short of breath, respiratory failure EXAM: PORTABLE CHEST 1 VIEW COMPARISON:  12/27/2017 FINDINGS: LEFT-sided pacemaker overlies normal cardiac silhouette. Bilateral small effusions. Mild interstitial edema pattern. No pneumothorax. No focal consolidation. IMPRESSION: Bilateral pleural effusions and mild interstitial edema Electronically Signed   By: Suzy Bouchard M.D.   On: 12/28/2017 08:47   Dg Chest Port 1 View  Result Date: 12/27/2017 CLINICAL DATA:  Short of breath, respiratory failure EXAM: PORTABLE CHEST 1 VIEW COMPARISON:  Portable chest x-ray of 12/25/2016 FINDINGS: There has been some improvement in opacity throughout the right lung although there may still be either mild pulmonary vascular congestion which is somewhat asymmetric or pneumonia. There may well be small pleural effusions present. Cardiomegaly is stable and pacer wires remain. Reverse right shoulder replacement is noted. IMPRESSION: 1. Some improvement in opacity throughout the right lung. This may be due to asymmetrical edema or possibly pneumonia with probable small pleural effusions. 2. Stable cardiomegaly with permanent pacemaker. Electronically Signed   By: Ivar Drape M.D.   On: 12/27/2017 08:56  Subjective: No chest pain or sob. No nausea or vomiting.  Discharge Exam: Vitals:   01/22/18 2337 01/23/18 0426  BP: (!) 142/60  (!) 131/42  Pulse: 62 79  Resp:  17  Temp:  (!) 97.5 F (36.4 C)  SpO2:  98%   Vitals:   01/22/18 2056 01/22/18 2337 01/23/18 0426 01/23/18 0500  BP: (!) 114/56 (!) 142/60 (!) 131/42   Pulse: 68 62 79   Resp: 16  17   Temp: (!) 97.5 F (36.4 C)  (!) 97.5 F (36.4 C)   TempSrc: Oral  Oral   SpO2: 100%  98%   Weight:    83.8 kg (184 lb 11.9 oz)  Height:        General: Pt is alert, awake, not in acute distress Cardiovascular: RRR, S1/S2 +, no rubs, no gallops Respiratory: CTA bilaterally, no wheezing, no rhonchi Abdominal: Soft, NT, ND, bowel sounds + Extremities: no edema, no cyanosis    The results of significant diagnostics from this hospitalization (including imaging, microbiology, ancillary and laboratory) are listed below for reference.     Microbiology: Recent Results (from the past 240 hour(s))  Urine Culture     Status: Abnormal   Collection Time: 01/19/18  5:16 PM  Result Value Ref Range Status   Specimen Description URINE, RANDOM  Final   Special Requests   Final    NONE Performed at [facility], [address], [place], [place] [address]    Culture MULTIPLE SPECIES PRESENT, SUGGEST RECOLLECTION (A)  Final   Report Status 01/20/2018 FINAL  Final     Labs: BNP (last 3 results) Recent Labs    01/19/18 1045  BNP 130.8*   Basic Metabolic Panel: Recent Labs  Lab 01/19/18 1045 01/20/18 0813  NA 143 142  K 4.0 3.8  CL 109 107  CO2 27 22  GLUCOSE 101* 97  BUN 30* 23  CREATININE 1.50* 1.29*  CALCIUM 9.5 9.4   Liver Function Tests: Recent Labs  Lab 01/19/18 1045  AST 29  ALT 36  ALKPHOS 123  BILITOT 0.6  PROT 5.5*  ALBUMIN 2.4*   No results for input(s): LIPASE, AMYLASE in the last 168 hours. No results for input(s): AMMONIA in the last 168 hours. CBC: Recent Labs  Lab 01/19/18 1045 01/20/18 0813 01/23/18 0445  WBC 14.2* 9.8 12.7*  NEUTROABS 12.5*  --   --   HGB 9.5* 9.5* 8.9*  HCT 32.1* 32.5* 30.6*  MCV 90.9 89.8 90.8   PLT 303 321 305   Cardiac Enzymes: Recent Labs  Lab 01/19/18 1045 01/19/18 1515 01/19/18 2029 01/20/18 0243 01/20/18 0813  CKTOTAL 25*  --   --   --   --   TROPONINI 0.05* 0.05* 0.05* 0.05* 0.04*   BNP: Invalid input(s): POCBNP CBG: No results for input(s): GLUCAP in the last 168 hours. D-Dimer No results for input(s): DDIMER in the last 72 hours. Hgb A1c No results for input(s): HGBA1C in the last 72 hours. Lipid Profile No results for input(s): CHOL, HDL, LDLCALC, TRIG, CHOLHDL, LDLDIRECT in the last 72 hours. Thyroid function studies No results for input(s): TSH, T4TOTAL, T3FREE, THYROIDAB in the last 72 hours.  Invalid input(s): FREET3 Anemia work up No results for input(s): VITAMINB12, FOLATE, FERRITIN, TIBC, IRON, RETICCTPCT in the last 72 hours. Urinalysis    Component Value Date/Time   COLORURINE YELLOW 01/19/2018 1716   APPEARANCEUR HAZY (A) 01/19/2018 1716   LABSPEC 1.018 01/19/2018 1716   LABSPEC 1.010 01/19/2006  [place] 5.0 01/19/2018 1716   GLUCOSEU NEGATIVE 01/19/2018 1716   HGBUR MODERATE (A) 01/19/2018 1716   HGBUR 2+ 04/13/2010 0800   BILIRUBINUR NEGATIVE 01/19/2018 1716   BILIRUBINUR Negative 01/19/2006 [place] 01/19/2018 1716   PROTEINUR 100 (A) 01/19/2018 1716   UROBILINOGEN 1.0 12/11/2014 1349   NITRITE NEGATIVE 01/19/2018 1716   LEUKOCYTESUR SMALL (A) 01/19/2018 1716   LEUKOCYTESUR Small 01/19/2006 1347   Sepsis Labs Invalid input(s): PROCALCITONIN,  WBC,  LACTICIDVEN Microbiology Recent Results (from the past 240 hour(s))  Urine Culture     Status: Abnormal   Collection Time: 01/19/18  5:16 PM  Result Value Ref Range Status   Specimen Description URINE, RANDOM  Final   Special Requests   Final    NONE Performed at [facility], [place] [address], [place], [place] [address]    Culture MULTIPLE SPECIES PRESENT, SUGGEST RECOLLECTION (A)  Final   Report Status 01/20/2018 FINAL  Final     Time  coordinating discharge: 35 minutes  SIGNED:   Hosie Poisson, MD  [facility] 01/24/2018, 8:59 AM Pager   If 7PM-7AM, please contact night-coverage [email or web address] Password [email or web address]

## 2018-01-24 NOTE — Care Management Important Message (Signed)
Important Message  Patient Details  Name: Christopher Baker MRN: [id number] Date of Birth: [birth year]   Medicare Important Message Given:  Yes    Luc Shammas 01/24/2018, 9:02 AM

## 2018-01-27 ENCOUNTER — Ambulatory Visit: Payer: Self-pay

## 2018-01-27 ENCOUNTER — Encounter: Payer: Self-pay | Admitting: Physical Medicine & Rehabilitation

## 2018-01-31 ENCOUNTER — Ambulatory Visit: Payer: Self-pay

## 2018-01-31 ENCOUNTER — Telehealth: Payer: Self-pay | Admitting: Internal Medicine

## 2018-01-31 NOTE — Telephone Encounter (Signed)
Called to confirm appt, pt's wife cancelled appt, pt in hospice. 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 Pls advise [phone] or [phone] wife [name]

## 2018-01-31 NOTE — Telephone Encounter (Signed)
Returned call to [name].  Pt is in downstairs living room at this time with hospice care following.   Wife would like to not have machine plugged in near Pt at this time.  Does not want to completely stop monitoring, would like device clinic to call when next remote check is due.  If Pt is doing better at that time would restart monitoring.  Advised that is ok.  Expressed sympathy.

## 2018-02-01 ENCOUNTER — Encounter: Payer: Self-pay | Admitting: Internal Medicine

## 2018-02-03 ENCOUNTER — Ambulatory Visit: Payer: Self-pay

## 2018-02-24 ENCOUNTER — Telehealth: Payer: Self-pay | Admitting: Neurology

## 2018-02-24 NOTE — Telephone Encounter (Signed)
Patient's wife called and wanted to let Dr. Tomi Likens know that Christopher Baker has been in [facility] care since July when he became ill. She wanted to thank Dr. Tomi Likens for everything he has done for [facility].  She cancelled his upcoming appointment. Thanks

## 2018-02-24 NOTE — Telephone Encounter (Signed)
FYI

## 2018-03-03 DIAGNOSIS — G4733 Obstructive sleep apnea (adult) (pediatric): Secondary | ICD-10-CM | POA: Diagnosis not present

## 2018-03-13 ENCOUNTER — Telehealth: Payer: Self-pay | Admitting: Cardiology

## 2018-03-13 ENCOUNTER — Encounter: Payer: PPO | Admitting: *Deleted

## 2018-03-13 NOTE — Telephone Encounter (Signed)
LMOVM reminding pt to send remote transmission.   

## 2018-03-14 ENCOUNTER — Encounter: Payer: Self-pay | Admitting: Cardiology

## 2018-03-15 ENCOUNTER — Telehealth: Payer: Self-pay | Admitting: Neurology

## 2018-03-15 NOTE — Telephone Encounter (Signed)
Patient's wife lmom that she had missed a call from our office? Thanks

## 2018-03-20 NOTE — Telephone Encounter (Signed)
Patient's wife wanted to let you know that he passed away.  They are very thankful for all you have done.

## 2018-03-21 DEATH — deceased

## 2018-04-06 ENCOUNTER — Ambulatory Visit: Payer: Self-pay | Admitting: Neurology

## 2018-05-15 ENCOUNTER — Ambulatory Visit: Payer: Self-pay | Admitting: Neurology

## 2019-08-02 IMAGING — DX DG CHEST 1V PORT
2 series · 2 of 2 positions shown · non-contrast
Comparison: 09/08/2017

CLINICAL DATA: Fever and altered mental status

EXAM:
PORTABLE CHEST 1 VIEW

[chest ap (1 of 2)]
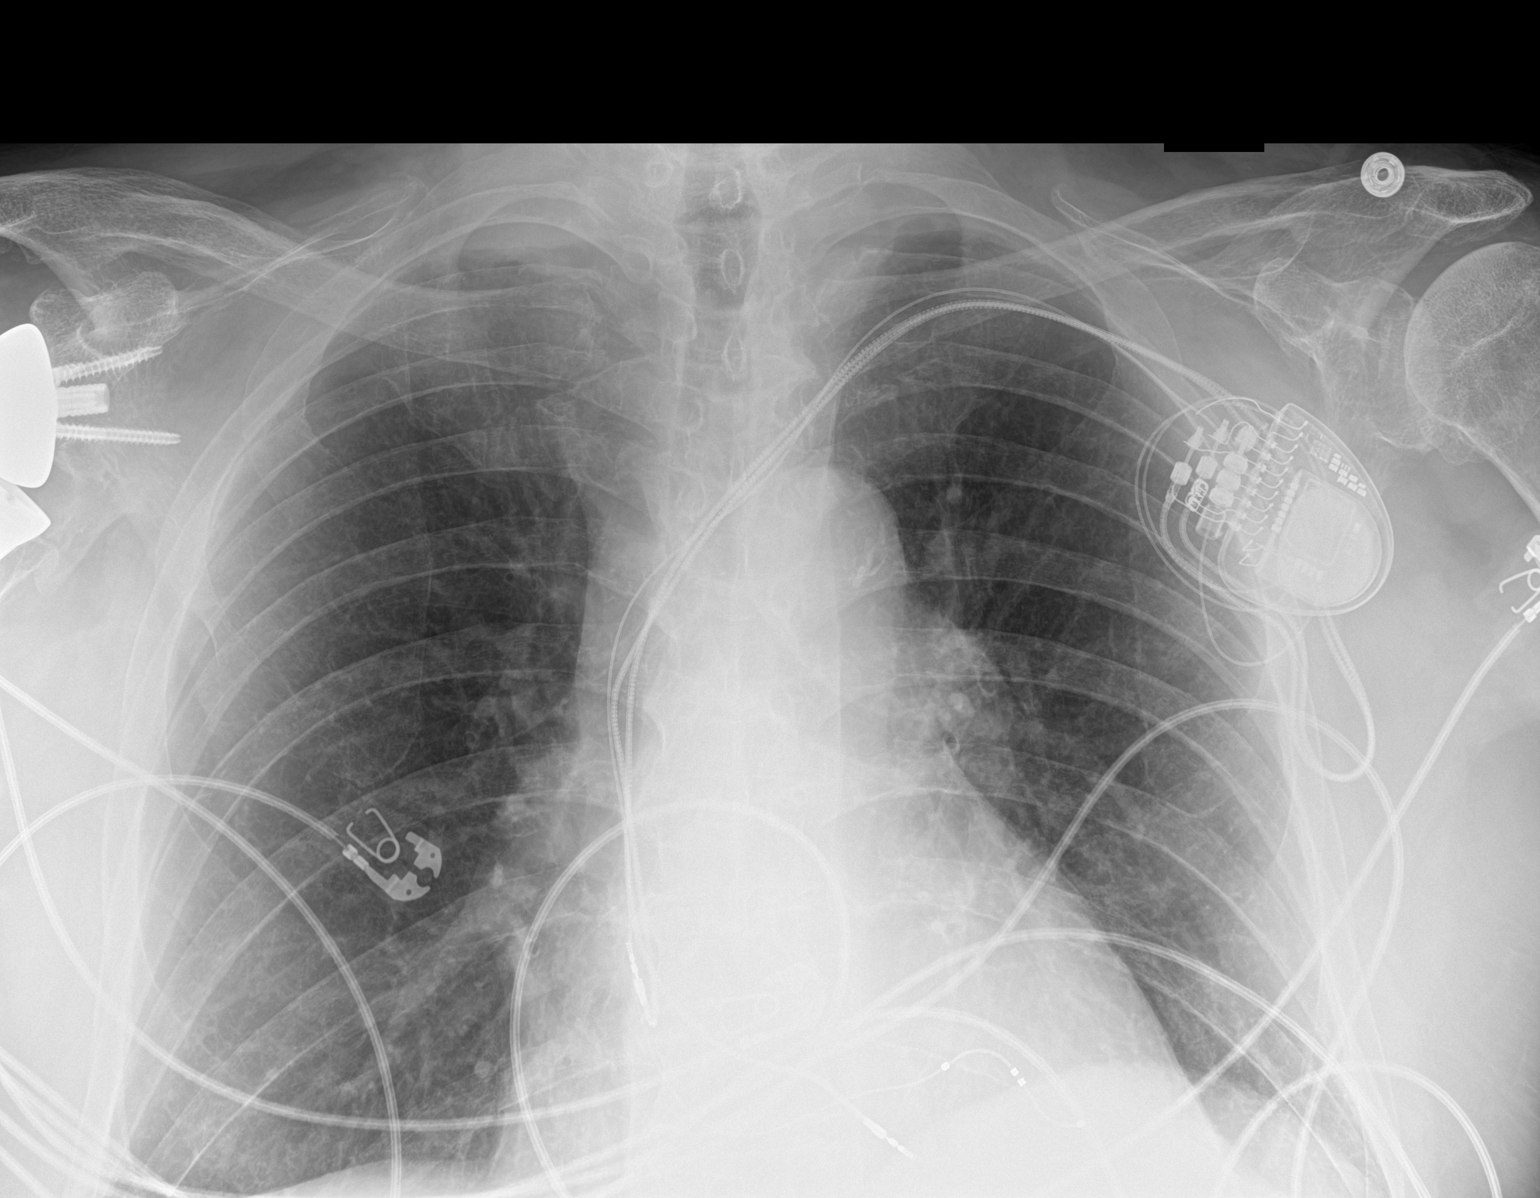

[chest ap (2 of 2)]
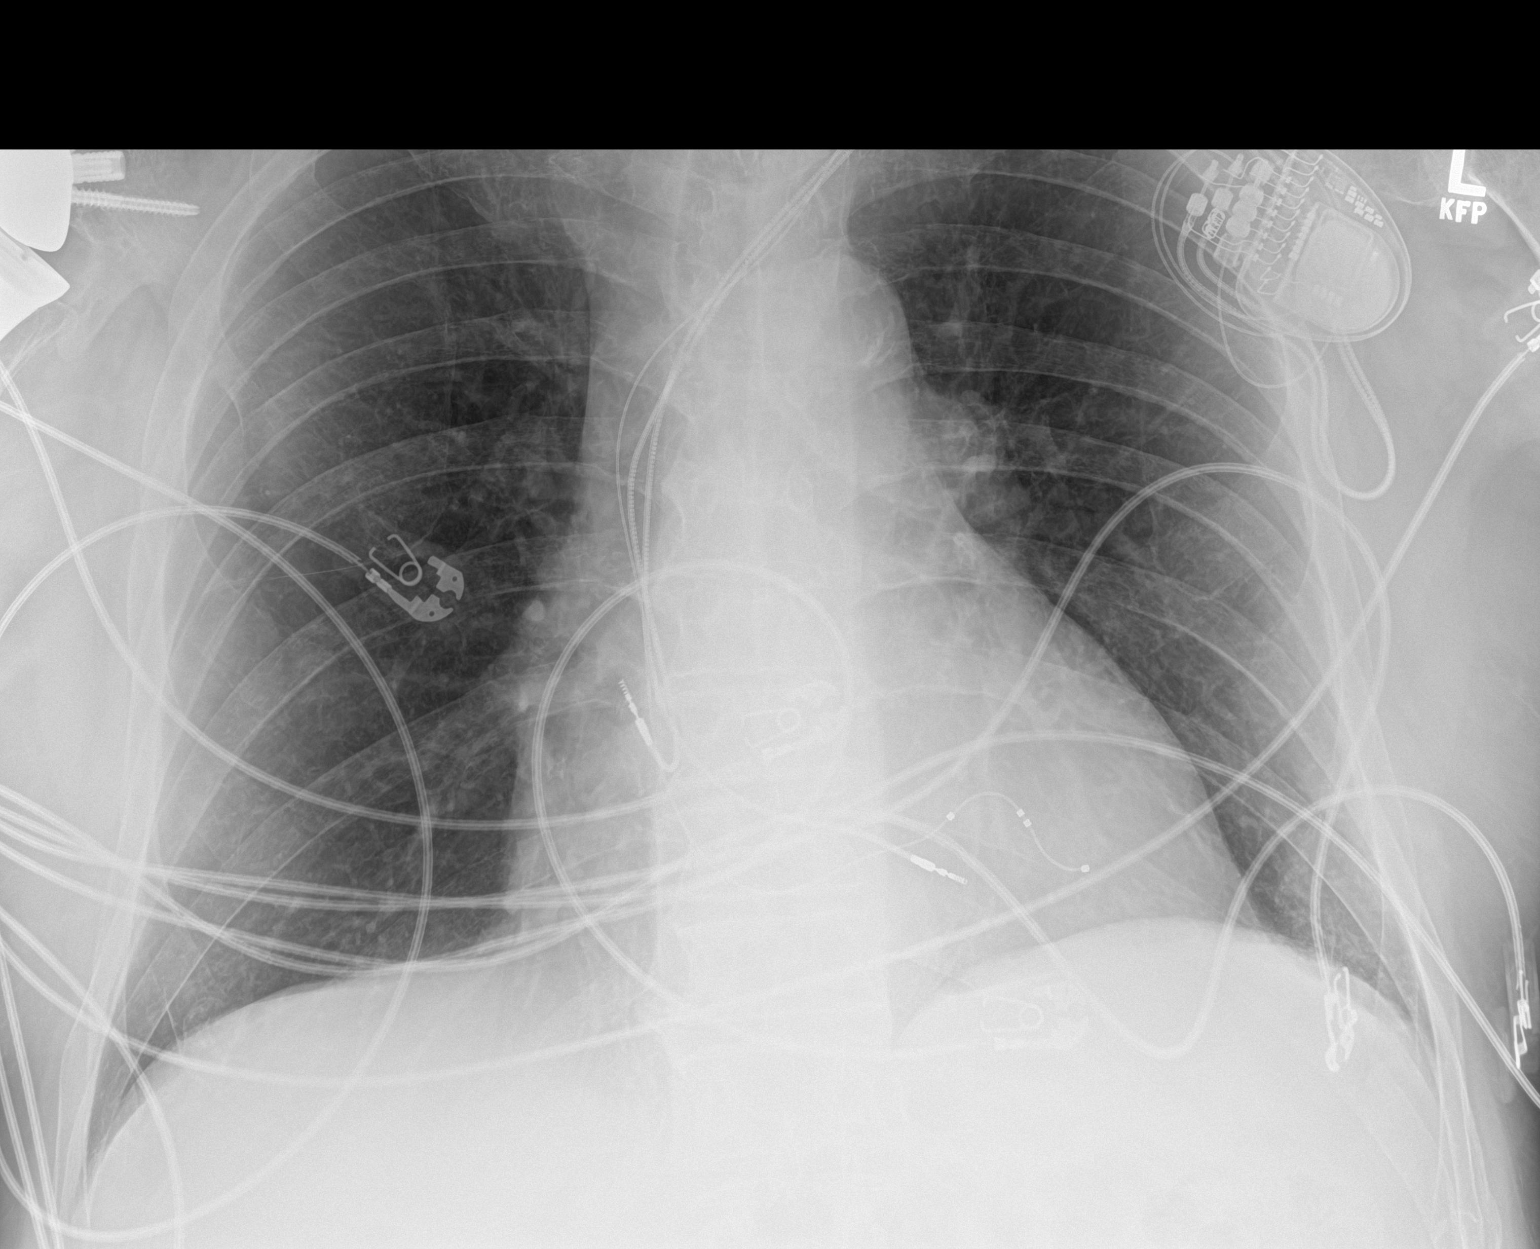

[2 of 2 positions shown; findings below may reference images not displayed]

FINDINGS: Cardiac shadow is within normal limits. Pacing device is again seen
and stable. The lungs are well aerated bilaterally. No focal
infiltrate or sizable effusion is seen. No acute bony abnormality is
noted. Postsurgical changes in the proximal right shoulder are
noted.
IMPRESSION: No acute abnormality seen.

## 2020-09-12 ENCOUNTER — Other Ambulatory Visit: Payer: Self-pay | Admitting: Internal Medicine
